# Patient Record
Sex: Female | Born: 1950 | Race: White | Hispanic: No | Marital: Married | Smoking: Current every day smoker
Health system: Southern US, Community
[De-identification: ages and names within clinical notes are randomized; demographics above are authoritative.]

## PROBLEM LIST (undated history)

## (undated) DIAGNOSIS — I2510 Atherosclerotic heart disease of native coronary artery without angina pectoris: Secondary | ICD-10-CM

## (undated) DIAGNOSIS — N184 Chronic kidney disease, stage 4 (severe): Secondary | ICD-10-CM

## (undated) DIAGNOSIS — R2689 Other abnormalities of gait and mobility: Secondary | ICD-10-CM

## (undated) DIAGNOSIS — I110 Hypertensive heart disease with heart failure: Secondary | ICD-10-CM

## (undated) DIAGNOSIS — E46 Unspecified protein-calorie malnutrition: Secondary | ICD-10-CM

## (undated) DIAGNOSIS — D509 Iron deficiency anemia, unspecified: Secondary | ICD-10-CM

## (undated) DIAGNOSIS — I739 Peripheral vascular disease, unspecified: Secondary | ICD-10-CM

## (undated) DIAGNOSIS — F32A Depression, unspecified: Secondary | ICD-10-CM

## (undated) DIAGNOSIS — M6281 Muscle weakness (generalized): Secondary | ICD-10-CM

## (undated) DIAGNOSIS — E785 Hyperlipidemia, unspecified: Secondary | ICD-10-CM

## (undated) DIAGNOSIS — I5042 Chronic combined systolic (congestive) and diastolic (congestive) heart failure: Secondary | ICD-10-CM

## (undated) DIAGNOSIS — I272 Pulmonary hypertension, unspecified: Secondary | ICD-10-CM

## (undated) DIAGNOSIS — I779 Disorder of arteries and arterioles, unspecified: Secondary | ICD-10-CM

## (undated) DIAGNOSIS — F329 Major depressive disorder, single episode, unspecified: Secondary | ICD-10-CM

## (undated) DIAGNOSIS — L02214 Cutaneous abscess of groin: Secondary | ICD-10-CM

## (undated) DIAGNOSIS — F0390 Unspecified dementia without behavioral disturbance: Secondary | ICD-10-CM

## (undated) DIAGNOSIS — I10 Essential (primary) hypertension: Secondary | ICD-10-CM

## (undated) DIAGNOSIS — R001 Bradycardia, unspecified: Secondary | ICD-10-CM

## (undated) DIAGNOSIS — E11319 Type 2 diabetes mellitus with unspecified diabetic retinopathy without macular edema: Secondary | ICD-10-CM

## (undated) DIAGNOSIS — K922 Gastrointestinal hemorrhage, unspecified: Secondary | ICD-10-CM

## (undated) DIAGNOSIS — J449 Chronic obstructive pulmonary disease, unspecified: Secondary | ICD-10-CM

## (undated) DIAGNOSIS — I701 Atherosclerosis of renal artery: Secondary | ICD-10-CM

## (undated) DIAGNOSIS — Z720 Tobacco use: Secondary | ICD-10-CM

## (undated) DIAGNOSIS — F419 Anxiety disorder, unspecified: Secondary | ICD-10-CM

## (undated) DIAGNOSIS — K5520 Angiodysplasia of colon without hemorrhage: Secondary | ICD-10-CM

## (undated) DIAGNOSIS — R319 Hematuria, unspecified: Secondary | ICD-10-CM

## (undated) DIAGNOSIS — D638 Anemia in other chronic diseases classified elsewhere: Secondary | ICD-10-CM

## (undated) HISTORY — DX: Depression, unspecified: F32.A

## (undated) HISTORY — DX: Chronic obstructive pulmonary disease, unspecified: J44.9

## (undated) HISTORY — DX: Disorder of arteries and arterioles, unspecified: I77.9

## (undated) HISTORY — PX: APPENDECTOMY: SHX54

## (undated) HISTORY — DX: Atherosclerotic heart disease of native coronary artery without angina pectoris: I25.10

## (undated) HISTORY — DX: Hyperlipidemia, unspecified: E78.5

## (undated) HISTORY — DX: Peripheral vascular disease, unspecified: I73.9

## (undated) HISTORY — DX: Major depressive disorder, single episode, unspecified: F32.9

## (undated) HISTORY — DX: Pulmonary hypertension, unspecified: I27.20

## (undated) HISTORY — DX: Chronic kidney disease, stage 4 (severe): N18.4

## (undated) HISTORY — PX: CHOLECYSTECTOMY: SHX55

## (undated) HISTORY — DX: Essential (primary) hypertension: I10

## (undated) HISTORY — DX: Atherosclerosis of renal artery: I70.1

## (undated) HISTORY — PX: TONSILLECTOMY: SUR1361

## (undated) HISTORY — DX: Chronic combined systolic (congestive) and diastolic (congestive) heart failure: I50.42

## (undated) HISTORY — DX: Hypertensive heart disease with heart failure: I11.0

## (undated) HISTORY — PX: SHOULDER ARTHROSCOPY: SHX128

## (undated) HISTORY — DX: Tobacco use: Z72.0

## (undated) HISTORY — PX: CAROTID ENDARTERECTOMY: SUR193

## (undated) HISTORY — PX: ILIAC ARTERY STENT: SHX1786

## (undated) HISTORY — DX: Bradycardia, unspecified: R00.1

---

## 1998-01-08 ENCOUNTER — Ambulatory Visit (HOSPITAL_COMMUNITY): Admission: RE | Admit: 1998-01-08 | Discharge: 1998-01-08 | Payer: Self-pay | Admitting: Cardiology

## 1998-07-05 ENCOUNTER — Emergency Department (HOSPITAL_COMMUNITY): Admission: EM | Admit: 1998-07-05 | Discharge: 1998-07-05 | Payer: Self-pay | Admitting: Emergency Medicine

## 1998-07-10 ENCOUNTER — Emergency Department (HOSPITAL_COMMUNITY): Admission: EM | Admit: 1998-07-10 | Discharge: 1998-07-10 | Payer: Self-pay | Admitting: Emergency Medicine

## 1998-12-10 ENCOUNTER — Inpatient Hospital Stay (HOSPITAL_COMMUNITY): Admission: EM | Admit: 1998-12-10 | Discharge: 1998-12-14 | Payer: Self-pay | Admitting: *Deleted

## 1999-02-03 ENCOUNTER — Inpatient Hospital Stay (HOSPITAL_COMMUNITY): Admission: EM | Admit: 1999-02-03 | Discharge: 1999-02-04 | Payer: Self-pay | Admitting: Emergency Medicine

## 1999-02-03 ENCOUNTER — Encounter: Payer: Self-pay | Admitting: Emergency Medicine

## 2000-04-18 ENCOUNTER — Emergency Department (HOSPITAL_COMMUNITY): Admission: EM | Admit: 2000-04-18 | Discharge: 2000-04-18 | Payer: Self-pay | Admitting: Emergency Medicine

## 2000-04-22 ENCOUNTER — Inpatient Hospital Stay (HOSPITAL_COMMUNITY): Admission: EM | Admit: 2000-04-22 | Discharge: 2000-04-24 | Payer: Self-pay | Admitting: Emergency Medicine

## 2000-04-22 ENCOUNTER — Encounter: Payer: Self-pay | Admitting: Emergency Medicine

## 2001-04-27 ENCOUNTER — Other Ambulatory Visit: Admission: RE | Admit: 2001-04-27 | Discharge: 2001-04-27 | Payer: Self-pay | Admitting: Family Medicine

## 2001-05-16 ENCOUNTER — Encounter: Admission: RE | Admit: 2001-05-16 | Discharge: 2001-05-16 | Payer: Self-pay | Admitting: Family Medicine

## 2001-05-16 ENCOUNTER — Encounter: Payer: Self-pay | Admitting: Family Medicine

## 2001-08-21 ENCOUNTER — Encounter: Payer: Self-pay | Admitting: Internal Medicine

## 2001-08-21 ENCOUNTER — Ambulatory Visit (HOSPITAL_COMMUNITY): Admission: RE | Admit: 2001-08-21 | Discharge: 2001-08-21 | Payer: Self-pay | Admitting: Internal Medicine

## 2002-02-05 ENCOUNTER — Encounter: Payer: Self-pay | Admitting: Emergency Medicine

## 2002-02-05 ENCOUNTER — Inpatient Hospital Stay (HOSPITAL_COMMUNITY): Admission: EM | Admit: 2002-02-05 | Discharge: 2002-02-08 | Payer: Self-pay | Admitting: Emergency Medicine

## 2002-02-06 ENCOUNTER — Encounter: Payer: Self-pay | Admitting: Family Medicine

## 2002-02-06 ENCOUNTER — Encounter: Payer: Self-pay | Admitting: Cardiology

## 2002-03-15 ENCOUNTER — Encounter: Admission: RE | Admit: 2002-03-15 | Discharge: 2002-03-15 | Payer: Self-pay | Admitting: Family Medicine

## 2002-04-15 ENCOUNTER — Encounter: Payer: Self-pay | Admitting: Emergency Medicine

## 2002-04-15 ENCOUNTER — Inpatient Hospital Stay (HOSPITAL_COMMUNITY): Admission: EM | Admit: 2002-04-15 | Discharge: 2002-04-19 | Payer: Self-pay | Admitting: Emergency Medicine

## 2002-05-25 ENCOUNTER — Emergency Department (HOSPITAL_COMMUNITY): Admission: EM | Admit: 2002-05-25 | Discharge: 2002-05-25 | Payer: Self-pay | Admitting: Emergency Medicine

## 2003-04-06 ENCOUNTER — Emergency Department (HOSPITAL_COMMUNITY): Admission: AD | Admit: 2003-04-06 | Discharge: 2003-04-06 | Payer: Self-pay | Admitting: Family Medicine

## 2003-05-08 ENCOUNTER — Encounter: Admission: RE | Admit: 2003-05-08 | Discharge: 2003-05-08 | Payer: Self-pay | Admitting: Nephrology

## 2003-05-20 ENCOUNTER — Ambulatory Visit (HOSPITAL_COMMUNITY): Admission: RE | Admit: 2003-05-20 | Discharge: 2003-05-20 | Payer: Self-pay | Admitting: Nephrology

## 2003-06-09 ENCOUNTER — Ambulatory Visit (HOSPITAL_COMMUNITY): Admission: RE | Admit: 2003-06-09 | Discharge: 2003-06-09 | Payer: Self-pay | Admitting: Nephrology

## 2003-06-10 ENCOUNTER — Ambulatory Visit (HOSPITAL_COMMUNITY): Admission: RE | Admit: 2003-06-10 | Discharge: 2003-06-10 | Payer: Self-pay | Admitting: Interventional Radiology

## 2003-10-06 ENCOUNTER — Ambulatory Visit (HOSPITAL_COMMUNITY): Admission: RE | Admit: 2003-10-06 | Discharge: 2003-10-06 | Payer: Self-pay | Admitting: Internal Medicine

## 2003-10-16 ENCOUNTER — Ambulatory Visit (HOSPITAL_COMMUNITY): Admission: RE | Admit: 2003-10-16 | Discharge: 2003-10-16 | Payer: Self-pay | Admitting: Internal Medicine

## 2003-10-20 ENCOUNTER — Inpatient Hospital Stay (HOSPITAL_COMMUNITY): Admission: EM | Admit: 2003-10-20 | Discharge: 2003-10-23 | Payer: Self-pay | Admitting: Emergency Medicine

## 2003-10-28 ENCOUNTER — Emergency Department (HOSPITAL_COMMUNITY): Admission: EM | Admit: 2003-10-28 | Discharge: 2003-10-29 | Payer: Self-pay | Admitting: Emergency Medicine

## 2003-11-05 ENCOUNTER — Encounter: Admission: RE | Admit: 2003-11-05 | Discharge: 2003-11-05 | Payer: Self-pay | Admitting: Family Medicine

## 2003-11-10 ENCOUNTER — Other Ambulatory Visit: Admission: RE | Admit: 2003-11-10 | Discharge: 2003-11-10 | Payer: Self-pay | Admitting: Obstetrics and Gynecology

## 2004-08-09 ENCOUNTER — Emergency Department (HOSPITAL_COMMUNITY): Admission: EM | Admit: 2004-08-09 | Discharge: 2004-08-10 | Payer: Self-pay | Admitting: Emergency Medicine

## 2004-08-10 ENCOUNTER — Ambulatory Visit (HOSPITAL_COMMUNITY): Admission: RE | Admit: 2004-08-10 | Discharge: 2004-08-10 | Payer: Self-pay | Admitting: Emergency Medicine

## 2004-08-19 ENCOUNTER — Encounter (HOSPITAL_COMMUNITY): Admission: RE | Admit: 2004-08-19 | Discharge: 2004-08-31 | Payer: Self-pay | Admitting: Internal Medicine

## 2004-08-23 ENCOUNTER — Ambulatory Visit (HOSPITAL_COMMUNITY): Admission: RE | Admit: 2004-08-23 | Discharge: 2004-08-23 | Payer: Self-pay | Admitting: Vascular Surgery

## 2004-09-07 ENCOUNTER — Ambulatory Visit: Payer: Self-pay | Admitting: Internal Medicine

## 2004-09-07 ENCOUNTER — Inpatient Hospital Stay (HOSPITAL_COMMUNITY): Admission: RE | Admit: 2004-09-07 | Discharge: 2004-09-11 | Payer: Self-pay | Admitting: *Deleted

## 2004-09-07 ENCOUNTER — Encounter (INDEPENDENT_AMBULATORY_CARE_PROVIDER_SITE_OTHER): Payer: Self-pay | Admitting: Specialist

## 2004-09-14 ENCOUNTER — Ambulatory Visit: Payer: Self-pay | Admitting: Internal Medicine

## 2004-09-16 ENCOUNTER — Encounter (HOSPITAL_COMMUNITY): Admission: RE | Admit: 2004-09-16 | Discharge: 2004-12-15 | Payer: Self-pay | Admitting: Internal Medicine

## 2004-09-24 ENCOUNTER — Ambulatory Visit: Payer: Self-pay | Admitting: Internal Medicine

## 2004-09-29 ENCOUNTER — Ambulatory Visit: Payer: Self-pay | Admitting: Internal Medicine

## 2004-09-29 ENCOUNTER — Encounter (INDEPENDENT_AMBULATORY_CARE_PROVIDER_SITE_OTHER): Payer: Self-pay | Admitting: *Deleted

## 2004-09-29 ENCOUNTER — Ambulatory Visit (HOSPITAL_COMMUNITY): Admission: RE | Admit: 2004-09-29 | Discharge: 2004-09-29 | Payer: Self-pay | Admitting: Internal Medicine

## 2005-07-28 ENCOUNTER — Ambulatory Visit: Payer: Self-pay | Admitting: Cardiology

## 2005-07-28 ENCOUNTER — Inpatient Hospital Stay (HOSPITAL_COMMUNITY): Admission: EM | Admit: 2005-07-28 | Discharge: 2005-08-03 | Payer: Self-pay | Admitting: Emergency Medicine

## 2005-07-29 ENCOUNTER — Encounter: Payer: Self-pay | Admitting: Cardiology

## 2005-08-04 ENCOUNTER — Ambulatory Visit: Payer: Self-pay | Admitting: Gastroenterology

## 2005-08-18 ENCOUNTER — Inpatient Hospital Stay (HOSPITAL_COMMUNITY): Admission: EM | Admit: 2005-08-18 | Discharge: 2005-08-23 | Payer: Self-pay | Admitting: Emergency Medicine

## 2005-08-24 ENCOUNTER — Inpatient Hospital Stay (HOSPITAL_COMMUNITY): Admission: EM | Admit: 2005-08-24 | Discharge: 2005-08-30 | Payer: Self-pay | Admitting: Emergency Medicine

## 2005-08-25 ENCOUNTER — Encounter: Payer: Self-pay | Admitting: Cardiology

## 2005-09-06 ENCOUNTER — Ambulatory Visit: Payer: Self-pay | Admitting: Internal Medicine

## 2005-10-10 ENCOUNTER — Ambulatory Visit: Payer: Self-pay | Admitting: Cardiology

## 2005-10-13 ENCOUNTER — Ambulatory Visit: Payer: Self-pay | Admitting: Hematology and Oncology

## 2005-10-19 LAB — CBC & DIFF AND RETIC
BASO%: 1.1 % (ref 0.0–2.0)
EOS%: 1.1 % (ref 0.0–7.0)
IRF: 0.48 — ABNORMAL HIGH (ref 0.130–0.330)
MCH: 26.2 pg (ref 26.0–34.0)
MCV: 80.9 fL — ABNORMAL LOW (ref 81.0–101.0)
MONO%: 6.9 % (ref 0.0–13.0)
RBC: 3.3 10*6/uL — ABNORMAL LOW (ref 3.70–5.32)
RDW: 17.8 % — ABNORMAL HIGH (ref 11.3–14.5)
RETIC #: 87.8 10*3/uL (ref 19.7–115.1)
Retic %: 2.7 % — ABNORMAL HIGH (ref 0.4–2.3)
lymph#: 1.1 10*3/uL (ref 0.9–3.3)

## 2005-10-19 LAB — CHCC SMEAR

## 2005-10-20 ENCOUNTER — Other Ambulatory Visit: Admission: RE | Admit: 2005-10-20 | Discharge: 2005-10-20 | Payer: Self-pay | Admitting: Hematology and Oncology

## 2005-10-20 ENCOUNTER — Encounter (HOSPITAL_COMMUNITY): Admission: RE | Admit: 2005-10-20 | Discharge: 2005-11-16 | Payer: Self-pay | Admitting: Hematology and Oncology

## 2005-10-20 ENCOUNTER — Encounter (INDEPENDENT_AMBULATORY_CARE_PROVIDER_SITE_OTHER): Payer: Self-pay | Admitting: *Deleted

## 2005-10-20 LAB — COMPREHENSIVE METABOLIC PANEL
ALT: <8 U/L (ref 0–40)
CO2: 28 mEq/L (ref 19–32)
Sodium: 136 mEq/L (ref 135–145)
Total Bilirubin: 0.3 mg/dL (ref 0.3–1.2)
Total Protein: 7.1 g/dL (ref 6.0–8.3)

## 2005-10-20 LAB — SPEP & IFE WITH QIG
Albumin ELP: 57.2 % (ref 55.8–66.1)
Beta 2: 4.6 % (ref 3.2–6.5)
Gamma Globulin: 9.1 % — ABNORMAL LOW (ref 11.1–18.8)
IgA: 155 mg/dL (ref 68–378)
IgG (Immunoglobin G), Serum: 545 mg/dL — ABNORMAL LOW (ref 694–1618)
IgM, Serum: 96 mg/dL (ref 60–263)

## 2005-10-20 LAB — IRON AND TIBC
%SAT: 6 % — ABNORMAL LOW (ref 20–55)
TIBC: 634 ug/dL — ABNORMAL HIGH (ref 250–470)

## 2005-10-20 LAB — FERRITIN: Ferritin: 10 ng/mL (ref 10–291)

## 2005-10-21 ENCOUNTER — Ambulatory Visit: Payer: Self-pay

## 2005-10-21 LAB — TYPE & CROSSMATCH - CHCC

## 2005-11-09 LAB — CBC WITH DIFFERENTIAL/PLATELET
Basophils Absolute: 0.1 10*3/uL (ref 0.0–0.1)
Eosinophils Absolute: 0 10*3/uL (ref 0.0–0.5)
HCT: 26.5 % — ABNORMAL LOW (ref 34.8–46.6)
HGB: 8.7 g/dL — ABNORMAL LOW (ref 11.6–15.9)
LYMPH%: 19.5 % (ref 14.0–48.0)
MCV: 79.8 fL — ABNORMAL LOW (ref 81.0–101.0)
MONO#: 0.5 10*3/uL (ref 0.1–0.9)
MONO%: 7 % (ref 0.0–13.0)
NEUT#: 4.7 10*3/uL (ref 1.5–6.5)
Platelets: 368 10*3/uL (ref 145–400)

## 2005-11-09 LAB — BASIC METABOLIC PANEL
BUN: 41 mg/dL — ABNORMAL HIGH (ref 6–23)
CO2: 28 mEq/L (ref 19–32)
Calcium: 10.5 mg/dL (ref 8.4–10.5)
Glucose, Bld: 186 mg/dL — ABNORMAL HIGH (ref 70–99)
Potassium: 4.1 mEq/L (ref 3.5–5.3)

## 2005-11-10 LAB — TYPE & CROSSMATCH - CHCC

## 2005-11-24 ENCOUNTER — Ambulatory Visit: Payer: Self-pay | Admitting: Cardiology

## 2005-11-28 ENCOUNTER — Ambulatory Visit: Payer: Self-pay | Admitting: Hematology and Oncology

## 2005-12-05 ENCOUNTER — Ambulatory Visit: Payer: Self-pay | Admitting: Cardiology

## 2005-12-06 ENCOUNTER — Ambulatory Visit: Payer: Self-pay | Admitting: Internal Medicine

## 2005-12-09 ENCOUNTER — Ambulatory Visit (HOSPITAL_COMMUNITY): Admission: AD | Admit: 2005-12-09 | Discharge: 2005-12-09 | Payer: Self-pay | Admitting: Cardiology

## 2005-12-09 ENCOUNTER — Ambulatory Visit: Payer: Self-pay | Admitting: Cardiology

## 2005-12-11 ENCOUNTER — Inpatient Hospital Stay (HOSPITAL_COMMUNITY): Admission: EM | Admit: 2005-12-11 | Discharge: 2005-12-12 | Payer: Self-pay | Admitting: Emergency Medicine

## 2005-12-26 ENCOUNTER — Ambulatory Visit: Payer: Self-pay | Admitting: Internal Medicine

## 2005-12-26 LAB — CONVERTED CEMR LAB
OCCULT 1: POSITIVE — AB
OCCULT 2: POSITIVE — AB

## 2005-12-28 ENCOUNTER — Ambulatory Visit: Payer: Self-pay

## 2005-12-28 ENCOUNTER — Ambulatory Visit: Payer: Self-pay | Admitting: Cardiology

## 2005-12-30 ENCOUNTER — Ambulatory Visit: Payer: Self-pay | Admitting: Cardiology

## 2005-12-30 ENCOUNTER — Ambulatory Visit (HOSPITAL_COMMUNITY): Admission: AD | Admit: 2005-12-30 | Discharge: 2005-12-31 | Payer: Self-pay | Admitting: Cardiology

## 2006-01-10 ENCOUNTER — Ambulatory Visit: Payer: Self-pay

## 2006-01-10 ENCOUNTER — Ambulatory Visit: Payer: Self-pay | Admitting: Cardiology

## 2006-01-24 ENCOUNTER — Ambulatory Visit: Payer: Self-pay | Admitting: Hematology and Oncology

## 2006-01-25 LAB — IRON AND TIBC
%SAT: 3 % — ABNORMAL LOW (ref 20–55)
TIBC: 547 ug/dL — ABNORMAL HIGH (ref 250–470)
UIBC: 529 ug/dL

## 2006-01-25 LAB — CBC WITH DIFFERENTIAL/PLATELET
Basophils Absolute: 0.1 10*3/uL (ref 0.0–0.1)
Eosinophils Absolute: 0.1 10*3/uL (ref 0.0–0.5)
HGB: 6.6 g/dL — CL (ref 11.6–15.9)
LYMPH%: 18.5 % (ref 14.0–48.0)
MCV: 84.6 fL (ref 81.0–101.0)
MONO#: 0.4 10*3/uL (ref 0.1–0.9)
MONO%: 8.8 % (ref 0.0–13.0)
NEUT#: 3.6 10*3/uL (ref 1.5–6.5)
Platelets: 363 10*3/uL (ref 145–400)
RBC: 2.44 10*6/uL — ABNORMAL LOW (ref 3.70–5.32)
WBC: 5.1 10*3/uL (ref 3.9–10.0)

## 2006-01-25 LAB — VITAMIN B12: Vitamin B-12: 695 pg/mL (ref 211–911)

## 2006-01-25 LAB — FERRITIN: Ferritin: 15 ng/mL (ref 10–291)

## 2006-01-26 ENCOUNTER — Encounter (HOSPITAL_COMMUNITY): Admission: RE | Admit: 2006-01-26 | Discharge: 2006-01-26 | Payer: Self-pay | Admitting: *Deleted

## 2006-03-28 ENCOUNTER — Ambulatory Visit: Payer: Self-pay | Admitting: Hematology and Oncology

## 2006-03-30 LAB — CBC WITH DIFFERENTIAL/PLATELET
Basophils Absolute: 0.1 10*3/uL (ref 0.0–0.1)
EOS%: 1.6 % (ref 0.0–7.0)
Eosinophils Absolute: 0.1 10*3/uL (ref 0.0–0.5)
HGB: 12 g/dL (ref 11.6–15.9)
LYMPH%: 17.1 % (ref 14.0–48.0)
MCH: 32.1 pg (ref 26.0–34.0)
MCV: 92.3 fL (ref 81.0–101.0)
MONO%: 8.4 % (ref 0.0–13.0)
NEUT#: 4.2 10*3/uL (ref 1.5–6.5)
Platelets: 258 10*3/uL (ref 145–400)
RDW: 16.4 % — ABNORMAL HIGH (ref 11.3–14.5)

## 2006-03-30 LAB — COMPREHENSIVE METABOLIC PANEL
AST: 20 U/L (ref 0–37)
Albumin: 4.3 g/dL (ref 3.5–5.2)
Alkaline Phosphatase: 83 U/L (ref 39–117)
BUN: 42 mg/dL — ABNORMAL HIGH (ref 6–23)
Creatinine, Ser: 1.08 mg/dL (ref 0.40–1.20)
Glucose, Bld: 180 mg/dL — ABNORMAL HIGH (ref 70–99)
Potassium: 4.6 mEq/L (ref 3.5–5.3)
Total Bilirubin: 0.2 mg/dL — ABNORMAL LOW (ref 0.3–1.2)

## 2006-03-30 LAB — IRON AND TIBC
Iron: 80 ug/dL (ref 42–145)
UIBC: 383 ug/dL

## 2006-03-31 ENCOUNTER — Ambulatory Visit: Payer: Self-pay | Admitting: Cardiology

## 2006-06-22 ENCOUNTER — Ambulatory Visit: Payer: Self-pay

## 2006-06-26 ENCOUNTER — Ambulatory Visit: Payer: Self-pay | Admitting: Cardiology

## 2006-07-03 ENCOUNTER — Ambulatory Visit: Payer: Self-pay | Admitting: Hematology and Oncology

## 2006-07-05 LAB — IRON AND TIBC: Iron: 154 ug/dL — ABNORMAL HIGH (ref 42–145)

## 2006-07-05 LAB — CBC WITH DIFFERENTIAL/PLATELET
BASO%: 1.1 % (ref 0.0–2.0)
EOS%: 1.9 % (ref 0.0–7.0)
LYMPH%: 20.3 % (ref 14.0–48.0)
MCHC: 34.9 g/dL (ref 32.0–36.0)
MCV: 91.4 fL (ref 81.0–101.0)
MONO%: 7.8 % (ref 0.0–13.0)
Platelets: 280 10*3/uL (ref 145–400)
RBC: 3.63 10*6/uL — ABNORMAL LOW (ref 3.70–5.32)
WBC: 6 10*3/uL (ref 3.9–10.0)

## 2006-08-16 ENCOUNTER — Encounter: Admission: RE | Admit: 2006-08-16 | Discharge: 2006-08-16 | Payer: Self-pay | Admitting: *Deleted

## 2006-09-22 ENCOUNTER — Ambulatory Visit: Payer: Self-pay | Admitting: Internal Medicine

## 2006-09-27 ENCOUNTER — Emergency Department: Payer: Self-pay | Admitting: Emergency Medicine

## 2006-10-03 ENCOUNTER — Ambulatory Visit: Payer: Self-pay | Admitting: Hematology and Oncology

## 2006-10-04 ENCOUNTER — Ambulatory Visit: Payer: Self-pay | Admitting: Internal Medicine

## 2006-10-04 ENCOUNTER — Encounter: Payer: Self-pay | Admitting: Internal Medicine

## 2006-10-10 LAB — CBC WITH DIFFERENTIAL/PLATELET
Basophils Absolute: 0.1 10*3/uL (ref 0.0–0.1)
EOS%: 2.1 % (ref 0.0–7.0)
HCT: 36.1 % (ref 34.8–46.6)
HGB: 12.5 g/dL (ref 11.6–15.9)
MCH: 31.5 pg (ref 26.0–34.0)
MCV: 90.8 fL (ref 81.0–101.0)
MONO%: 8.9 % (ref 0.0–13.0)
NEUT%: 70.7 % (ref 39.6–76.8)

## 2006-10-11 LAB — IRON AND TIBC
%SAT: 17 % — ABNORMAL LOW (ref 20–55)
TIBC: 482 ug/dL — ABNORMAL HIGH (ref 250–470)

## 2006-10-11 LAB — FERRITIN: Ferritin: 23 ng/mL (ref 10–291)

## 2006-11-17 ENCOUNTER — Ambulatory Visit: Payer: Self-pay | Admitting: Hematology and Oncology

## 2006-12-05 LAB — CBC WITH DIFFERENTIAL/PLATELET
BASO%: 0.9 % (ref 0.0–2.0)
Basophils Absolute: 0.1 10*3/uL (ref 0.0–0.1)
EOS%: 1.5 % (ref 0.0–7.0)
HCT: 33.4 % — ABNORMAL LOW (ref 34.8–46.6)
HGB: 11.7 g/dL (ref 11.6–15.9)
MCH: 32 pg (ref 26.0–34.0)
MCHC: 34.9 g/dL (ref 32.0–36.0)
MONO#: 0.5 10*3/uL (ref 0.1–0.9)
RDW: 15.2 % — ABNORMAL HIGH (ref 11.3–14.5)
WBC: 6.1 10*3/uL (ref 3.9–10.0)
lymph#: 1 10*3/uL (ref 0.9–3.3)

## 2006-12-05 LAB — IRON AND TIBC
%SAT: 13 % — ABNORMAL LOW (ref 20–55)
TIBC: 399 ug/dL (ref 250–470)
UIBC: 347 ug/dL

## 2006-12-19 ENCOUNTER — Encounter: Admission: RE | Admit: 2006-12-19 | Discharge: 2006-12-19 | Payer: Self-pay | Admitting: Orthopedic Surgery

## 2006-12-26 ENCOUNTER — Ambulatory Visit: Payer: Self-pay

## 2007-01-05 ENCOUNTER — Encounter: Admission: RE | Admit: 2007-01-05 | Discharge: 2007-01-05 | Payer: Self-pay | Admitting: Orthopedic Surgery

## 2007-01-09 ENCOUNTER — Encounter: Payer: Self-pay | Admitting: Orthopedic Surgery

## 2007-01-09 ENCOUNTER — Observation Stay (HOSPITAL_COMMUNITY): Admission: RE | Admit: 2007-01-09 | Discharge: 2007-01-10 | Payer: Self-pay | Admitting: Orthopedic Surgery

## 2007-01-30 ENCOUNTER — Ambulatory Visit: Payer: Self-pay | Admitting: Hematology and Oncology

## 2007-02-01 LAB — CBC WITH DIFFERENTIAL/PLATELET
BASO%: 0.6 % (ref 0.0–2.0)
EOS%: 1.8 % (ref 0.0–7.0)
HCT: 36.6 % (ref 34.8–46.6)
LYMPH%: 14.2 % (ref 14.0–48.0)
MCH: 30.6 pg (ref 26.0–34.0)
MCHC: 33.5 g/dL (ref 32.0–36.0)
MONO%: 8.2 % (ref 0.0–13.0)
NEUT%: 75.2 % (ref 39.6–76.8)
Platelets: 238 10*3/uL (ref 145–400)
RBC: 4.01 10*6/uL (ref 3.70–5.32)

## 2007-02-01 LAB — IRON AND TIBC: %SAT: 20 % (ref 20–55)

## 2007-03-09 ENCOUNTER — Ambulatory Visit: Payer: Self-pay | Admitting: Hematology and Oncology

## 2007-03-09 LAB — CBC WITH DIFFERENTIAL/PLATELET
Basophils Absolute: 0.1 10*3/uL (ref 0.0–0.1)
Eosinophils Absolute: 0.1 10*3/uL (ref 0.0–0.5)
HCT: 39.8 % (ref 34.8–46.6)
HGB: 13.1 g/dL (ref 11.6–15.9)
MCV: 90.6 fL (ref 81.0–101.0)
MONO%: 7.7 % (ref 0.0–13.0)
NEUT#: 5.3 10*3/uL (ref 1.5–6.5)
NEUT%: 71.9 % (ref 39.6–76.8)
Platelets: 250 10*3/uL (ref 145–400)
RDW: 15.2 % — ABNORMAL HIGH (ref 11.3–14.5)

## 2007-03-09 LAB — BASIC METABOLIC PANEL
BUN: 27 mg/dL — ABNORMAL HIGH (ref 6–23)
Glucose, Bld: 120 mg/dL — ABNORMAL HIGH (ref 70–99)
Potassium: 5.4 mEq/L — ABNORMAL HIGH (ref 3.5–5.3)

## 2007-03-09 LAB — IRON AND TIBC: Iron: 155 ug/dL — ABNORMAL HIGH (ref 42–145)

## 2007-04-26 ENCOUNTER — Ambulatory Visit: Payer: Self-pay | Admitting: Cardiology

## 2007-05-04 ENCOUNTER — Ambulatory Visit: Payer: Self-pay

## 2007-07-21 ENCOUNTER — Emergency Department: Payer: Self-pay | Admitting: Unknown Physician Specialty

## 2007-07-22 ENCOUNTER — Emergency Department (HOSPITAL_COMMUNITY): Admission: EM | Admit: 2007-07-22 | Discharge: 2007-07-22 | Payer: Self-pay | Admitting: Emergency Medicine

## 2007-11-22 ENCOUNTER — Ambulatory Visit: Payer: Self-pay | Admitting: Cardiology

## 2007-12-10 ENCOUNTER — Encounter: Admission: RE | Admit: 2007-12-10 | Discharge: 2007-12-10 | Payer: Self-pay | Admitting: *Deleted

## 2008-01-05 ENCOUNTER — Emergency Department: Payer: Self-pay | Admitting: Internal Medicine

## 2008-05-16 DIAGNOSIS — Z8719 Personal history of other diseases of the digestive system: Secondary | ICD-10-CM

## 2008-05-16 DIAGNOSIS — I080 Rheumatic disorders of both mitral and aortic valves: Secondary | ICD-10-CM

## 2008-05-16 DIAGNOSIS — F329 Major depressive disorder, single episode, unspecified: Secondary | ICD-10-CM

## 2008-05-16 DIAGNOSIS — Z8673 Personal history of transient ischemic attack (TIA), and cerebral infarction without residual deficits: Secondary | ICD-10-CM | POA: Insufficient documentation

## 2008-05-16 DIAGNOSIS — E118 Type 2 diabetes mellitus with unspecified complications: Secondary | ICD-10-CM | POA: Insufficient documentation

## 2008-05-16 DIAGNOSIS — F341 Dysthymic disorder: Secondary | ICD-10-CM

## 2008-05-16 DIAGNOSIS — D638 Anemia in other chronic diseases classified elsewhere: Secondary | ICD-10-CM

## 2008-05-16 DIAGNOSIS — I5022 Chronic systolic (congestive) heart failure: Secondary | ICD-10-CM

## 2008-05-16 DIAGNOSIS — E669 Obesity, unspecified: Secondary | ICD-10-CM | POA: Insufficient documentation

## 2008-05-16 DIAGNOSIS — G459 Transient cerebral ischemic attack, unspecified: Secondary | ICD-10-CM | POA: Insufficient documentation

## 2008-05-16 DIAGNOSIS — E162 Hypoglycemia, unspecified: Secondary | ICD-10-CM

## 2008-05-16 DIAGNOSIS — J449 Chronic obstructive pulmonary disease, unspecified: Secondary | ICD-10-CM

## 2008-05-16 DIAGNOSIS — E785 Hyperlipidemia, unspecified: Secondary | ICD-10-CM | POA: Insufficient documentation

## 2008-05-16 HISTORY — DX: Anemia in other chronic diseases classified elsewhere: D63.8

## 2008-05-19 ENCOUNTER — Encounter: Payer: Self-pay | Admitting: Cardiology

## 2008-05-19 ENCOUNTER — Ambulatory Visit: Payer: Self-pay | Admitting: Cardiology

## 2008-05-27 ENCOUNTER — Encounter: Payer: Self-pay | Admitting: Cardiology

## 2008-05-27 ENCOUNTER — Ambulatory Visit: Payer: Self-pay

## 2008-06-11 ENCOUNTER — Ambulatory Visit: Payer: Self-pay | Admitting: Cardiology

## 2008-06-27 ENCOUNTER — Inpatient Hospital Stay (HOSPITAL_COMMUNITY): Admission: EM | Admit: 2008-06-27 | Discharge: 2008-06-29 | Payer: Self-pay | Admitting: Emergency Medicine

## 2008-06-28 ENCOUNTER — Ambulatory Visit: Payer: Self-pay | Admitting: Gastroenterology

## 2008-07-18 ENCOUNTER — Inpatient Hospital Stay (HOSPITAL_COMMUNITY): Admission: EM | Admit: 2008-07-18 | Discharge: 2008-07-19 | Payer: Self-pay | Admitting: Emergency Medicine

## 2008-08-06 ENCOUNTER — Ambulatory Visit: Payer: Self-pay | Admitting: Hematology and Oncology

## 2008-08-06 ENCOUNTER — Encounter: Payer: Self-pay | Admitting: Internal Medicine

## 2008-08-06 LAB — CBC WITH DIFFERENTIAL/PLATELET
BASO%: 0.8 % (ref 0.0–2.0)
LYMPH%: 16.4 % (ref 14.0–49.7)
MCHC: 33 g/dL (ref 31.5–36.0)
MONO#: 0.6 10*3/uL (ref 0.1–0.9)
RBC: 4.49 10*6/uL (ref 3.70–5.45)
WBC: 9.6 10*3/uL (ref 3.9–10.3)
lymph#: 1.6 10*3/uL (ref 0.9–3.3)

## 2008-08-06 LAB — BASIC METABOLIC PANEL
CO2: 23 mEq/L (ref 19–32)
Calcium: 10.1 mg/dL (ref 8.4–10.5)
Chloride: 100 mEq/L (ref 96–112)
Sodium: 138 mEq/L (ref 135–145)

## 2008-08-06 LAB — IRON AND TIBC
%SAT: 13 % — ABNORMAL LOW (ref 20–55)
TIBC: 627 ug/dL — ABNORMAL HIGH (ref 250–470)
UIBC: 546 ug/dL

## 2008-10-02 ENCOUNTER — Ambulatory Visit: Payer: Self-pay | Admitting: Hematology and Oncology

## 2008-10-06 LAB — CBC WITH DIFFERENTIAL/PLATELET
Basophils Absolute: 0 10*3/uL (ref 0.0–0.1)
Eosinophils Absolute: 0.1 10*3/uL (ref 0.0–0.5)
HCT: 39 % (ref 34.8–46.6)
HGB: 13.1 g/dL (ref 11.6–15.9)
LYMPH%: 17.8 % (ref 14.0–49.7)
MONO#: 0.6 10*3/uL (ref 0.1–0.9)
NEUT#: 5.9 10*3/uL (ref 1.5–6.5)
NEUT%: 72.6 % (ref 38.4–76.8)
Platelets: 239 10*3/uL (ref 145–400)
RBC: 4.65 10*6/uL (ref 3.70–5.45)
WBC: 8.1 10*3/uL (ref 3.9–10.3)

## 2008-10-06 LAB — BASIC METABOLIC PANEL
BUN: 28 mg/dL — ABNORMAL HIGH (ref 6–23)
CO2: 22 mEq/L (ref 19–32)
Calcium: 10.1 mg/dL (ref 8.4–10.5)
Glucose, Bld: 157 mg/dL — ABNORMAL HIGH (ref 70–99)
Sodium: 143 mEq/L (ref 135–145)

## 2008-10-06 LAB — FERRITIN: Ferritin: 184 ng/mL (ref 10–291)

## 2008-10-06 LAB — IRON AND TIBC
TIBC: 429 ug/dL (ref 250–470)
UIBC: 334 ug/dL

## 2008-10-08 ENCOUNTER — Encounter: Payer: Self-pay | Admitting: Internal Medicine

## 2008-12-05 ENCOUNTER — Ambulatory Visit: Payer: Self-pay | Admitting: Hematology and Oncology

## 2008-12-11 ENCOUNTER — Encounter: Payer: Self-pay | Admitting: Internal Medicine

## 2008-12-11 LAB — CBC WITH DIFFERENTIAL/PLATELET
Basophils Absolute: 0 10*3/uL (ref 0.0–0.1)
HCT: 36 % (ref 34.8–46.6)
HGB: 12.3 g/dL (ref 11.6–15.9)
LYMPH%: 3.9 % — ABNORMAL LOW (ref 14.0–49.7)
MCH: 30.5 pg (ref 25.1–34.0)
MONO#: 0.3 10*3/uL (ref 0.1–0.9)
NEUT%: 93.9 % — ABNORMAL HIGH (ref 38.4–76.8)
Platelets: 272 10*3/uL (ref 145–400)
WBC: 13.4 10*3/uL — ABNORMAL HIGH (ref 3.9–10.3)
lymph#: 0.5 10*3/uL — ABNORMAL LOW (ref 0.9–3.3)

## 2008-12-11 LAB — BASIC METABOLIC PANEL
BUN: 31 mg/dL — ABNORMAL HIGH (ref 6–23)
CO2: 24 mEq/L (ref 19–32)
Chloride: 103 mEq/L (ref 96–112)
Creatinine, Ser: 0.91 mg/dL (ref 0.40–1.20)

## 2008-12-13 ENCOUNTER — Emergency Department (HOSPITAL_COMMUNITY): Admission: EM | Admit: 2008-12-13 | Discharge: 2008-12-13 | Payer: Self-pay | Admitting: Emergency Medicine

## 2009-02-06 ENCOUNTER — Encounter: Admission: RE | Admit: 2009-02-06 | Discharge: 2009-02-06 | Payer: Self-pay | Admitting: Internal Medicine

## 2009-02-08 ENCOUNTER — Observation Stay (HOSPITAL_COMMUNITY): Admission: EM | Admit: 2009-02-08 | Discharge: 2009-02-08 | Payer: Self-pay | Admitting: Emergency Medicine

## 2009-02-18 ENCOUNTER — Ambulatory Visit: Payer: Self-pay | Admitting: Hematology and Oncology

## 2009-02-24 ENCOUNTER — Encounter: Payer: Self-pay | Admitting: Internal Medicine

## 2009-02-24 LAB — COMPREHENSIVE METABOLIC PANEL
AST: 18 U/L (ref 0–37)
CO2: 28 mEq/L (ref 19–32)
Calcium: 9.5 mg/dL (ref 8.4–10.5)
Chloride: 98 mEq/L (ref 96–112)
Creatinine, Ser: 0.93 mg/dL (ref 0.40–1.20)
Potassium: 4.2 mEq/L (ref 3.5–5.3)
Sodium: 140 mEq/L (ref 135–145)
Total Bilirubin: 0.2 mg/dL — ABNORMAL LOW (ref 0.3–1.2)
Total Protein: 6.3 g/dL (ref 6.0–8.3)

## 2009-02-24 LAB — CBC WITH DIFFERENTIAL/PLATELET
BASO%: 0.8 % (ref 0.0–2.0)
EOS%: 1.3 % (ref 0.0–7.0)
Eosinophils Absolute: 0.1 10*3/uL (ref 0.0–0.5)
LYMPH%: 13.5 % — ABNORMAL LOW (ref 14.0–49.7)
MCHC: 33.8 g/dL (ref 31.5–36.0)
MONO%: 7.3 % (ref 0.0–14.0)
NEUT%: 77.1 % — ABNORMAL HIGH (ref 38.4–76.8)
Platelets: 348 10*3/uL (ref 145–400)
RBC: 3.96 10*6/uL (ref 3.70–5.45)
RDW: 14.1 % (ref 11.2–14.5)
WBC: 11.1 10*3/uL — ABNORMAL HIGH (ref 3.9–10.3)
lymph#: 1.5 10*3/uL (ref 0.9–3.3)

## 2009-02-24 LAB — IRON AND TIBC
TIBC: 402 ug/dL (ref 250–470)
UIBC: 355 ug/dL

## 2009-04-20 ENCOUNTER — Ambulatory Visit: Payer: Self-pay | Admitting: Hematology and Oncology

## 2009-04-22 LAB — CBC WITH DIFFERENTIAL/PLATELET
BASO%: 0.8 % (ref 0.0–2.0)
Basophils Absolute: 0.1 10*3/uL (ref 0.0–0.1)
EOS%: 1.9 % (ref 0.0–7.0)
Eosinophils Absolute: 0.2 10*3/uL (ref 0.0–0.5)
HCT: 36 % (ref 34.8–46.6)
HGB: 12.3 g/dL (ref 11.6–15.9)
LYMPH%: 18.2 % (ref 14.0–49.7)
MCH: 30.9 pg (ref 25.1–34.0)
MCHC: 34 g/dL (ref 31.5–36.0)
MONO%: 6.2 % (ref 0.0–14.0)
NEUT#: 7.5 10*3/uL — ABNORMAL HIGH (ref 1.5–6.5)
Platelets: 433 10*3/uL — ABNORMAL HIGH (ref 145–400)
RBC: 3.96 10*6/uL (ref 3.70–5.45)
RDW: 15.3 % — ABNORMAL HIGH (ref 11.2–14.5)
lymph#: 1.9 10*3/uL (ref 0.9–3.3)

## 2009-04-22 LAB — COMPREHENSIVE METABOLIC PANEL
ALT: <8 U/L (ref 0–35)
BUN: 48 mg/dL — ABNORMAL HIGH (ref 6–23)
CO2: 26 mEq/L (ref 19–32)
Creatinine, Ser: 0.99 mg/dL (ref 0.40–1.20)
Glucose, Bld: 126 mg/dL — ABNORMAL HIGH (ref 70–99)
Sodium: 138 mEq/L (ref 135–145)

## 2009-04-24 ENCOUNTER — Encounter: Payer: Self-pay | Admitting: Internal Medicine

## 2009-06-07 ENCOUNTER — Emergency Department (HOSPITAL_COMMUNITY): Admission: EM | Admit: 2009-06-07 | Discharge: 2009-06-07 | Payer: Self-pay | Admitting: Emergency Medicine

## 2009-07-03 ENCOUNTER — Emergency Department: Payer: Self-pay | Admitting: Internal Medicine

## 2009-10-28 ENCOUNTER — Ambulatory Visit: Payer: Self-pay | Admitting: Hematology and Oncology

## 2009-10-30 ENCOUNTER — Encounter: Payer: Self-pay | Admitting: Internal Medicine

## 2009-10-30 LAB — CBC WITH DIFFERENTIAL/PLATELET
Eosinophils Absolute: 0.1 10*3/uL (ref 0.0–0.5)
HGB: 10.3 g/dL — ABNORMAL LOW (ref 11.6–15.9)
LYMPH%: 12.4 % — ABNORMAL LOW (ref 14.0–49.7)
NEUT#: 7.4 10*3/uL — ABNORMAL HIGH (ref 1.5–6.5)
lymph#: 1.2 10*3/uL (ref 0.9–3.3)

## 2009-10-30 LAB — FERRITIN: Ferritin: 33 ng/mL (ref 10–291)

## 2009-10-30 LAB — IRON AND TIBC: UIBC: 277 ug/dL

## 2009-10-30 LAB — COMPREHENSIVE METABOLIC PANEL
Albumin: 3.7 g/dL (ref 3.5–5.2)
BUN: 15 mg/dL (ref 6–23)
Calcium: 9.4 mg/dL (ref 8.4–10.5)
Chloride: 99 mEq/L (ref 96–112)
Glucose, Bld: 107 mg/dL — ABNORMAL HIGH (ref 70–99)
Potassium: 4.2 mEq/L (ref 3.5–5.3)
Sodium: 139 mEq/L (ref 135–145)
Total Bilirubin: 0.2 mg/dL — ABNORMAL LOW (ref 0.3–1.2)

## 2009-11-05 ENCOUNTER — Inpatient Hospital Stay (HOSPITAL_COMMUNITY): Admission: EM | Admit: 2009-11-05 | Discharge: 2009-11-07 | Payer: Self-pay | Admitting: Emergency Medicine

## 2009-11-12 ENCOUNTER — Encounter (INDEPENDENT_AMBULATORY_CARE_PROVIDER_SITE_OTHER): Payer: Self-pay | Admitting: Internal Medicine

## 2009-11-12 LAB — CONVERTED CEMR LAB
ALT: 9 units/L (ref 0–35)
Albumin: 3.8 g/dL (ref 3.5–5.2)
BUN: 25 mg/dL — ABNORMAL HIGH (ref 6–23)
CO2: 31 meq/L (ref 19–32)
Calcium: 10.1 mg/dL (ref 8.4–10.5)
Eosinophils Relative: 2 % (ref 0–5)
Ferritin: 707 ng/mL — ABNORMAL HIGH (ref 10–291)
HCT: 41.2 % (ref 36.0–46.0)
Hemoglobin: 12.7 g/dL (ref 12.0–15.0)
Lymphocytes Relative: 17 % (ref 12–46)
MCHC: 30.8 g/dL (ref 30.0–36.0)
MCV: 94.7 fL (ref 78.0–100.0)
Platelets: 411 10*3/uL — ABNORMAL HIGH (ref 150–400)
Sodium: 142 meq/L (ref 135–145)
Total Protein: 6.4 g/dL (ref 6.0–8.3)
Vit D, 25-Hydroxy: 61 ng/mL (ref 30–89)
WBC: 8.4 10*3/uL (ref 4.0–10.5)

## 2009-11-15 ENCOUNTER — Emergency Department (HOSPITAL_COMMUNITY): Admission: EM | Admit: 2009-11-15 | Discharge: 2009-11-15 | Payer: Self-pay | Admitting: Family Medicine

## 2009-11-17 ENCOUNTER — Emergency Department: Payer: Self-pay | Admitting: Emergency Medicine

## 2010-01-22 ENCOUNTER — Encounter (INDEPENDENT_AMBULATORY_CARE_PROVIDER_SITE_OTHER): Payer: Self-pay | Admitting: *Deleted

## 2010-01-26 ENCOUNTER — Ambulatory Visit (HOSPITAL_BASED_OUTPATIENT_CLINIC_OR_DEPARTMENT_OTHER): Payer: Self-pay | Admitting: Hematology and Oncology

## 2010-03-14 ENCOUNTER — Encounter: Payer: Self-pay | Admitting: Gastroenterology

## 2010-03-23 NOTE — Letter (Signed)
[facility]   Imported By: Sherian Rein 03/19/2009 14:24:49  _____________________________________________________________________  External Attachment:    Type:   Image     Comment:   External Document

## 2010-03-23 NOTE — Letter (Signed)
[facility]   Imported By: Lester Marland 05/19/2009 07:44:09  _____________________________________________________________________  External Attachment:    Type:   Image     Comment:   External Document

## 2010-03-23 NOTE — Letter (Signed)
Summary: [facility]  [facility]   Imported By: Sherian Rein 11/12/2009 07:48:46  _____________________________________________________________________  External Attachment:    Type:   Image     Comment:   External Document

## 2010-04-13 ENCOUNTER — Encounter (INDEPENDENT_AMBULATORY_CARE_PROVIDER_SITE_OTHER): Payer: Self-pay | Admitting: *Deleted

## 2010-04-13 LAB — CONVERTED CEMR LAB
Cholesterol: 225 mg/dL — ABNORMAL HIGH (ref 0–200)
HDL: 37 mg/dL — ABNORMAL LOW (ref >39)
Total CHOL/HDL Ratio: 6.1

## 2010-05-06 LAB — CBC
HCT: 32.4 % — ABNORMAL LOW (ref 36.0–46.0)
HCT: 32.6 % — ABNORMAL LOW (ref 36.0–46.0)
HCT: 33.7 % — ABNORMAL LOW (ref 36.0–46.0)
Hemoglobin: 10.5 g/dL — ABNORMAL LOW (ref 12.0–15.0)
Hemoglobin: 10.7 g/dL — ABNORMAL LOW (ref 12.0–15.0)
MCH: 29.1 pg (ref 26.0–34.0)
MCH: 29.4 pg (ref 26.0–34.0)
MCH: 29.7 pg (ref 26.0–34.0)
MCHC: 32.3 g/dL (ref 30.0–36.0)
MCHC: 32.9 g/dL (ref 30.0–36.0)
RBC: 3.59 MIL/uL — ABNORMAL LOW (ref 3.87–5.11)
RBC: 3.65 MIL/uL — ABNORMAL LOW (ref 3.87–5.11)
RBC: 3.75 MIL/uL — ABNORMAL LOW (ref 3.87–5.11)
RDW: 15.4 % (ref 11.5–15.5)

## 2010-05-06 LAB — BASIC METABOLIC PANEL
CO2: 30 mEq/L (ref 19–32)
CO2: 32 mEq/L (ref 19–32)
Chloride: 103 mEq/L (ref 96–112)
GFR calc Af Amer: >60 mL/min (ref >60)
GFR calc non Af Amer: >60 mL/min (ref >60)
Glucose, Bld: 157 mg/dL — ABNORMAL HIGH (ref 70–99)
Glucose, Bld: 212 mg/dL — ABNORMAL HIGH (ref 70–99)
Potassium: 3.8 mEq/L (ref 3.5–5.1)
Potassium: 4.4 mEq/L (ref 3.5–5.1)
Sodium: 141 mEq/L (ref 135–145)
Sodium: 144 mEq/L (ref 135–145)

## 2010-05-06 LAB — CARDIAC PANEL(CRET KIN+CKTOT+MB+TROPI)
CK, MB: 0.7 ng/mL (ref 0.3–4.0)
CK, MB: 0.8 ng/mL (ref 0.3–4.0)
Relative Index: INVALID (ref 0.0–2.5)
Total CK: 28 U/L (ref 7–177)
Troponin I: 0.02 ng/mL (ref 0.00–0.06)

## 2010-05-06 LAB — GLUCOSE, CAPILLARY
Glucose-Capillary: 182 mg/dL — ABNORMAL HIGH (ref 70–99)
Glucose-Capillary: 207 mg/dL — ABNORMAL HIGH (ref 70–99)
Glucose-Capillary: 91 mg/dL (ref 70–99)

## 2010-05-06 LAB — CULTURE, BLOOD (ROUTINE X 2): Culture  Setup Time: 201109151018

## 2010-05-06 LAB — POCT CARDIAC MARKERS: Myoglobin, poc: 63 ng/mL (ref 12–200)

## 2010-05-06 LAB — DIFFERENTIAL
Basophils Absolute: 0.1 10*3/uL (ref 0.0–0.1)
Basophils Relative: 1 % (ref 0–1)
Eosinophils Absolute: 0.2 10*3/uL (ref 0.0–0.7)
Lymphs Abs: 0.8 10*3/uL (ref 0.7–4.0)
Monocytes Absolute: 0.7 10*3/uL (ref 0.1–1.0)
Monocytes Absolute: 0.9 10*3/uL (ref 0.1–1.0)
Monocytes Relative: 6 % (ref 3–12)
Monocytes Relative: 8 % (ref 3–12)
Neutro Abs: 6.7 10*3/uL (ref 1.7–7.7)
Neutrophils Relative %: 80 % — ABNORMAL HIGH (ref 43–77)

## 2010-05-06 LAB — POCT I-STAT, CHEM 8
BUN: 16 mg/dL (ref 6–23)
Calcium, Ion: 1.18 mmol/L (ref 1.12–1.32)
Chloride: 104 mEq/L (ref 96–112)
Creatinine, Ser: 0.7 mg/dL (ref 0.4–1.2)
Glucose, Bld: 169 mg/dL — ABNORMAL HIGH (ref 70–99)
HCT: 36 % (ref 36.0–46.0)
Hemoglobin: 12.2 g/dL (ref 12.0–15.0)
Potassium: 3.3 mEq/L — ABNORMAL LOW (ref 3.5–5.1)
Sodium: 143 mEq/L (ref 135–145)
TCO2: 31 mmol/L (ref 0–100)

## 2010-05-06 LAB — PHOSPHORUS: Phosphorus: 3.6 mg/dL (ref 2.3–4.6)

## 2010-05-06 LAB — COMPREHENSIVE METABOLIC PANEL
ALT: 16 U/L (ref 0–35)
AST: 27 U/L (ref 0–37)
Calcium: 9.1 mg/dL (ref 8.4–10.5)
GFR calc Af Amer: >60 mL/min (ref >60)
Sodium: 143 mEq/L (ref 135–145)
Total Protein: 6 g/dL (ref 6.0–8.3)

## 2010-05-06 LAB — BRAIN NATRIURETIC PEPTIDE
Pro B Natriuretic peptide (BNP): 550 pg/mL — ABNORMAL HIGH (ref 0.0–100.0)
Pro B Natriuretic peptide (BNP): 673 pg/mL — ABNORMAL HIGH (ref 0.0–100.0)

## 2010-05-06 LAB — HEMOGLOBIN A1C: Hgb A1c MFr Bld: 5.3 % (ref <5.7)

## 2010-05-06 LAB — LIPID PANEL
HDL: 38 mg/dL — ABNORMAL LOW (ref >39)
VLDL: 32 mg/dL (ref 0–40)

## 2010-05-06 LAB — PROTIME-INR: Prothrombin Time: 14.1 seconds (ref 11.6–15.2)

## 2010-05-06 LAB — PROCALCITONIN: Procalcitonin: <0.1 ng/mL

## 2010-05-06 LAB — TSH: TSH: 1.663 u[IU]/mL (ref 0.350–4.500)

## 2010-05-11 ENCOUNTER — Encounter (INDEPENDENT_AMBULATORY_CARE_PROVIDER_SITE_OTHER): Payer: Self-pay | Admitting: *Deleted

## 2010-05-11 LAB — POCT URINALYSIS DIP (DEVICE)
Glucose, UA: NEGATIVE mg/dL
Nitrite: POSITIVE — AB
Protein, ur: 100 mg/dL — AB
Specific Gravity, Urine: 1.015 (ref 1.005–1.030)
Urobilinogen, UA: 0.2 mg/dL (ref 0.0–1.0)

## 2010-05-11 LAB — URINE CULTURE

## 2010-05-24 LAB — DIFFERENTIAL
Basophils Relative: 1 % (ref 0–1)
Eosinophils Relative: 2 % (ref 0–5)
Monocytes Absolute: 0.7 10*3/uL (ref 0.1–1.0)
Monocytes Relative: 9 % (ref 3–12)
Neutro Abs: 5.5 10*3/uL (ref 1.7–7.7)

## 2010-05-24 LAB — CBC
HCT: 35.6 % — ABNORMAL LOW (ref 36.0–46.0)
Hemoglobin: 12.2 g/dL (ref 12.0–15.0)
MCHC: 34.3 g/dL (ref 30.0–36.0)
RBC: 3.97 MIL/uL (ref 3.87–5.11)
RDW: 14.4 % (ref 11.5–15.5)

## 2010-05-24 LAB — BASIC METABOLIC PANEL
CO2: 22 mEq/L (ref 19–32)
Calcium: 9.2 mg/dL (ref 8.4–10.5)
Chloride: 104 mEq/L (ref 96–112)
GFR calc Af Amer: 30 mL/min — ABNORMAL LOW (ref >60)
Glucose, Bld: 70 mg/dL (ref 70–99)
Potassium: 5 mEq/L (ref 3.5–5.1)
Sodium: 135 mEq/L (ref 135–145)

## 2010-05-27 LAB — PROTIME-INR
INR: 0.91 (ref 0.00–1.49)
Prothrombin Time: 12.2 seconds (ref 11.6–15.2)

## 2010-05-27 LAB — POCT I-STAT, CHEM 8
Creatinine, Ser: 0.7 mg/dL (ref 0.4–1.2)
Creatinine, Ser: 1.2 mg/dL (ref 0.4–1.2)
Glucose, Bld: 144 mg/dL — ABNORMAL HIGH (ref 70–99)
Hemoglobin: 11.9 g/dL — ABNORMAL LOW (ref 12.0–15.0)
Hemoglobin: 13.9 g/dL (ref 12.0–15.0)
Sodium: 138 mEq/L (ref 135–145)
TCO2: 25 mmol/L (ref 0–100)
TCO2: 28 mmol/L (ref 0–100)

## 2010-05-27 LAB — DIFFERENTIAL
Basophils Absolute: 0 10*3/uL (ref 0.0–0.1)
Basophils Relative: 0 % (ref 0–1)
Monocytes Absolute: 0.5 10*3/uL (ref 0.1–1.0)
Neutro Abs: 8 10*3/uL — ABNORMAL HIGH (ref 1.7–7.7)
Neutrophils Relative %: 85 % — ABNORMAL HIGH (ref 43–77)

## 2010-05-27 LAB — CBC
MCHC: 34.3 g/dL (ref 30.0–36.0)
Platelets: 270 10*3/uL (ref 150–400)
RDW: 13.6 % (ref 11.5–15.5)

## 2010-05-27 LAB — URINALYSIS, ROUTINE W REFLEX MICROSCOPIC
Bilirubin Urine: NEGATIVE
Ketones, ur: NEGATIVE mg/dL
Specific Gravity, Urine: 1.022 (ref 1.005–1.030)
Urobilinogen, UA: 0.2 mg/dL (ref 0.0–1.0)
pH: 5.5 (ref 5.0–8.0)

## 2010-05-27 LAB — URINE CULTURE
Colony Count: NO GROWTH
Culture: NO GROWTH

## 2010-05-27 LAB — POCT CARDIAC MARKERS
Myoglobin, poc: 107 ng/mL (ref 12–200)
Myoglobin, poc: 108 ng/mL (ref 12–200)

## 2010-05-27 LAB — URINE MICROSCOPIC-ADD ON

## 2010-06-01 LAB — COMPREHENSIVE METABOLIC PANEL
ALT: 13 U/L (ref 0–35)
AST: 21 U/L (ref 0–37)
Albumin: 3.4 g/dL — ABNORMAL LOW (ref 3.5–5.2)
Alkaline Phosphatase: 100 U/L (ref 39–117)
BUN: 23 mg/dL (ref 6–23)
GFR calc Af Amer: >60 mL/min (ref >60)
Potassium: 3.9 mEq/L (ref 3.5–5.1)
Sodium: 139 mEq/L (ref 135–145)
Total Protein: 6.7 g/dL (ref 6.0–8.3)

## 2010-06-01 LAB — CBC
HCT: 28 % — ABNORMAL LOW (ref 36.0–46.0)
HCT: 26.5 % — ABNORMAL LOW (ref 36.0–46.0)
HCT: 28.5 % — ABNORMAL LOW (ref 36.0–46.0)
Hemoglobin: 8.6 g/dL — ABNORMAL LOW (ref 12.0–15.0)
Hemoglobin: 9.1 g/dL — ABNORMAL LOW (ref 12.0–15.0)
Hemoglobin: 9.4 g/dL — ABNORMAL LOW (ref 12.0–15.0)
MCHC: 31.8 g/dL (ref 30.0–36.0)
MCHC: 32.9 g/dL (ref 30.0–36.0)
MCV: 78.5 fL (ref 78.0–100.0)
Platelets: 341 10*3/uL (ref 150–400)
Platelets: 360 10*3/uL (ref 150–400)
Platelets: 407 10*3/uL — ABNORMAL HIGH (ref 150–400)
Platelets: 421 10*3/uL — ABNORMAL HIGH (ref 150–400)
RBC: 3.47 MIL/uL — ABNORMAL LOW (ref 3.87–5.11)
RBC: 3.56 MIL/uL — ABNORMAL LOW (ref 3.87–5.11)
RBC: 3.42 MIL/uL — ABNORMAL LOW (ref 3.87–5.11)
RDW: 15.3 % (ref 11.5–15.5)
RDW: 15.4 % (ref 11.5–15.5)
RDW: 15.6 % — ABNORMAL HIGH (ref 11.5–15.5)
RDW: 15.9 % — ABNORMAL HIGH (ref 11.5–15.5)
RDW: 15.9 % — ABNORMAL HIGH (ref 11.5–15.5)
WBC: 7.7 10*3/uL (ref 4.0–10.5)

## 2010-06-01 LAB — BASIC METABOLIC PANEL
BUN: 19 mg/dL (ref 6–23)
BUN: 16 mg/dL (ref 6–23)
CO2: 27 mEq/L (ref 19–32)
CO2: 28 mEq/L (ref 19–32)
Calcium: 9.4 mg/dL (ref 8.4–10.5)
Calcium: 9 mg/dL (ref 8.4–10.5)
Chloride: 105 mEq/L (ref 96–112)
Chloride: 105 mEq/L (ref 96–112)
Chloride: 105 mEq/L (ref 96–112)
Creatinine, Ser: 0.85 mg/dL (ref 0.4–1.2)
GFR calc Af Amer: >60 mL/min (ref >60)
GFR calc Af Amer: >60 mL/min (ref >60)
GFR calc Af Amer: >60 mL/min (ref >60)
GFR calc Af Amer: >60 mL/min (ref >60)
GFR calc non Af Amer: >60 mL/min (ref >60)
GFR calc non Af Amer: >60 mL/min (ref >60)
GFR calc non Af Amer: >60 mL/min (ref >60)
Glucose, Bld: 130 mg/dL — ABNORMAL HIGH (ref 70–99)
Potassium: 3.6 mEq/L (ref 3.5–5.1)
Potassium: 3.9 mEq/L (ref 3.5–5.1)
Sodium: 138 mEq/L (ref 135–145)
Sodium: 142 mEq/L (ref 135–145)

## 2010-06-01 LAB — IRON AND TIBC
Saturation Ratios: 2 % — ABNORMAL LOW (ref 20–55)
UIBC: 593 ug/dL

## 2010-06-01 LAB — CROSSMATCH
ABO/RH(D): O POS
Antibody Screen: NEGATIVE

## 2010-06-01 LAB — HEMOGLOBIN AND HEMATOCRIT, BLOOD
HCT: 28.8 % — ABNORMAL LOW (ref 36.0–46.0)
Hemoglobin: 11.6 g/dL — ABNORMAL LOW (ref 12.0–15.0)

## 2010-06-01 LAB — POCT I-STAT, CHEM 8
Calcium, Ion: 1.06 mmol/L — ABNORMAL LOW (ref 1.12–1.32)
HCT: 25 % — ABNORMAL LOW (ref 36.0–46.0)
Hemoglobin: 8.5 g/dL — ABNORMAL LOW (ref 12.0–15.0)
Sodium: 137 mEq/L (ref 135–145)
TCO2: 25 mmol/L (ref 0–100)

## 2010-06-01 LAB — PHOSPHORUS
Phosphorus: 3.5 mg/dL (ref 2.3–4.6)
Phosphorus: 4.1 mg/dL (ref 2.3–4.6)

## 2010-06-01 LAB — GLUCOSE, CAPILLARY
Glucose-Capillary: 102 mg/dL — ABNORMAL HIGH (ref 70–99)
Glucose-Capillary: 104 mg/dL — ABNORMAL HIGH (ref 70–99)
Glucose-Capillary: 139 mg/dL — ABNORMAL HIGH (ref 70–99)
Glucose-Capillary: 140 mg/dL — ABNORMAL HIGH (ref 70–99)
Glucose-Capillary: 144 mg/dL — ABNORMAL HIGH (ref 70–99)
Glucose-Capillary: 162 mg/dL — ABNORMAL HIGH (ref 70–99)

## 2010-06-01 LAB — POCT CARDIAC MARKERS: Troponin i, poc: <0.05 ng/mL (ref 0.00–0.09)

## 2010-06-01 LAB — DIFFERENTIAL
Basophils Relative: 1 % (ref 0–1)
Lymphocytes Relative: 17 % (ref 12–46)
Monocytes Absolute: 0.6 10*3/uL (ref 0.1–1.0)
Monocytes Absolute: 0.8 10*3/uL (ref 0.1–1.0)
Monocytes Relative: 8 % (ref 3–12)
Monocytes Relative: 10 % (ref 3–12)
Neutro Abs: 5.3 10*3/uL (ref 1.7–7.7)
Neutro Abs: 5.8 10*3/uL (ref 1.7–7.7)

## 2010-06-01 LAB — RETICULOCYTES: Retic Ct Pct: 3.3 % — ABNORMAL HIGH (ref 0.4–3.1)

## 2010-06-01 LAB — CARDIAC PANEL(CRET KIN+CKTOT+MB+TROPI)
CK, MB: 0.6 ng/mL (ref 0.3–4.0)
Relative Index: INVALID (ref 0.0–2.5)
Total CK: 23 U/L (ref 7–177)
Troponin I: 0.01 ng/mL (ref 0.00–0.06)
Troponin I: 0.01 ng/mL (ref 0.00–0.06)

## 2010-06-01 LAB — MAGNESIUM
Magnesium: 1.7 mg/dL (ref 1.5–2.5)
Magnesium: 1.7 mg/dL (ref 1.5–2.5)

## 2010-06-01 LAB — HEMOCCULT GUIAC POC 1CARD (OFFICE): Fecal Occult Bld: POSITIVE

## 2010-06-01 LAB — CK TOTAL AND CKMB (NOT AT ARMC)
Relative Index: INVALID (ref 0.0–2.5)
Total CK: 28 U/L (ref 7–177)

## 2010-06-01 LAB — VITAMIN B12: Vitamin B-12: 1040 pg/mL — ABNORMAL HIGH (ref 211–911)

## 2010-06-01 LAB — FOLATE: Folate: >20 ng/mL

## 2010-06-01 LAB — PREPARE RBC (CROSSMATCH)

## 2010-06-23 ENCOUNTER — Encounter: Payer: Self-pay | Admitting: Hematology and Oncology

## 2010-06-23 DIAGNOSIS — F17200 Nicotine dependence, unspecified, uncomplicated: Secondary | ICD-10-CM

## 2010-06-23 DIAGNOSIS — D509 Iron deficiency anemia, unspecified: Secondary | ICD-10-CM

## 2010-06-23 DIAGNOSIS — K922 Gastrointestinal hemorrhage, unspecified: Secondary | ICD-10-CM

## 2010-06-23 LAB — CBC WITH DIFFERENTIAL/PLATELET
Basophils Absolute: 0 10*3/uL (ref 0.0–0.1)
EOS%: 2.4 % (ref 0.0–7.0)
Eosinophils Absolute: 0.2 10*3/uL (ref 0.0–0.5)
LYMPH%: 16.7 % (ref 14.0–49.7)
MCH: 31.4 pg (ref 25.1–34.0)
MCV: 90.8 fL (ref 79.5–101.0)
MONO%: 9.4 % (ref 0.0–14.0)
Platelets: 250 10*3/uL (ref 145–400)
RBC: 4.3 10*6/uL (ref 3.70–5.45)
RDW: 12.5 % (ref 11.2–14.5)

## 2010-06-23 LAB — BASIC METABOLIC PANEL
BUN: 35 mg/dL — ABNORMAL HIGH (ref 6–23)
Calcium: 9.5 mg/dL (ref 8.4–10.5)
Glucose, Bld: 192 mg/dL — ABNORMAL HIGH (ref 70–99)
Potassium: 3.7 mEq/L (ref 3.5–5.3)
Sodium: 139 mEq/L (ref 135–145)

## 2010-06-23 LAB — IRON AND TIBC
Iron: 86 ug/dL (ref 42–145)
UIBC: 342 ug/dL

## 2010-06-23 LAB — FERRITIN: Ferritin: 149 ng/mL (ref 10–291)

## 2010-06-30 ENCOUNTER — Encounter (HOSPITAL_BASED_OUTPATIENT_CLINIC_OR_DEPARTMENT_OTHER): Payer: Self-pay | Admitting: Hematology and Oncology

## 2010-06-30 DIAGNOSIS — F17200 Nicotine dependence, unspecified, uncomplicated: Secondary | ICD-10-CM

## 2010-06-30 DIAGNOSIS — K922 Gastrointestinal hemorrhage, unspecified: Secondary | ICD-10-CM

## 2010-06-30 DIAGNOSIS — D500 Iron deficiency anemia secondary to blood loss (chronic): Secondary | ICD-10-CM

## 2010-06-30 DIAGNOSIS — D509 Iron deficiency anemia, unspecified: Secondary | ICD-10-CM

## 2010-07-06 NOTE — Progress Notes (Signed)
[facility]                        PERIPHERAL VASCULAR OFFICE NOTE   NAME:[name], [name]                        MRN:          [id number]  DATE:06/26/2006                            DOB:          11/26/1950    PRIMARY CARE PHYSICIAN:  [name], M.D.   PRIMARY CARDIOLOGIST:  [name], M.D.   HISTORY OF PRESENT ILLNESS:  [name] is a 60 year old lady with  atherosclerotic peripheral arterial disease. She is status post stenting  of her left common and external iliac arteries in October 2007. ABI  improved to 0.70. She remains without any symptoms of claudication. She  does have substantial SFA disease bilaterally.   ABIs today are 0.62 on the left and 0.64 on the right. These are  unchanged within the limits of the test.   MEDICATIONS:  1. Lasix 80 mg daily.  2. Kay Ciel 10 mEq daily.  3. Toprol XL 25 mg daily.  4. Teveten 300 mg daily.  5. Iron twice daily.  6. Pravachol 40 mg q.h.s.  7. Lopid 600 mg twice daily.  8. Zyprexa 2.5 mg daily.  9. Zoloft 100 mg daily.  10.Actos 15 mg daily.  11.Metformin 500 mg daily.  12.Altace 10 mg daily.  13.Aspirin 325 mg daily.   PHYSICAL EXAMINATION:  GENERAL:  She is generally well-appearing in no  distress.  VITAL SIGNS:  Heart rate 64, blood pressure 108/52 and weight of 188  pounds.  NECK:  She has no jugular venous distention, thyromegaly or  lymphadenopathy.  LUNGS:  Clear to auscultation. Respiratory effort is normal.  CARDIAC:  She has a nondisplaced point of maximal cardiac impulse. There  is a regular rate and rhythm without murmurs, rubs or gallops.  ABDOMEN:  Obese, soft, nondistended, nontender. No hepatosplenomegaly  and no pulsatile midline mass. Bowel sounds are normal.  EXTREMITIES:  Warm without clubbing, cyanosis, edema or ulceration.  Femoral pulses 1+ on the left and trace on the right.   IMPRESSION/RECOMMENDATIONS:  1. Lower extremity peripheral arterial  disease:  Doing nicely after      stenting of the left common and external iliac arteries.  2. Coronary disease managed by Dr. Antoine [name].  3. Diabetes mellitus.  4. Hypertension.  5. Chronic anemia.  6. Obesity.  7. Mitral regurgitation.  8. Atherosclerotic cerebrovascular disease managed by Dr. Madilyn [name].   She is doing well. Will not have her back for peripheral followup but  followup with Dr. Antoine [name].     Salvadore Farber, MD  Electronically Signed    WED/MedQ  DD: 06/26/2006  DT: 06/26/2006  Job #: [id number]

## 2010-07-06 NOTE — Op Note (Signed)
NAME:  [name], [name] NO.:  [id number]   MEDICAL RECORD NO.:  [id number]          PATIENT TYPE:  AMB   LOCATION:  DSC                          FACILITY:  [facility]   PHYSICIAN:  Dyke Brackett, M.D.    DATE OF BIRTH:  07-04-1950   DATE OF PROCEDURE:  01/09/2007  DATE OF DISCHARGE:                               OPERATIVE REPORT   INDICATIONS:  This is a 60 year old with an MRI proven frozen shoulder  with cuff pathology thought to be amenable to outpatient surgery.   PREOPERATIVE DIAGNOSES:  1. Frozen shoulder.  2. Impingement, partial rotator cuff tear.  3. AC joint arthritis.  4. Labral tear.   POSTOPERATIVE DIAGNOSES:  1. Frozen shoulder.  2. Impingement, partial rotator cuff tear.  3. AC joint arthritis.  4. Labral tear.   OPERATION:  1. Arthroscopic acromioplasty.  2. Arthroscopic debridement torn labrum.  3. Arthroscopic lysis of adhesions.  4. __________ under distal clavicle.  5. Closed manipulation under anesthesia, all for the right shoulder.   SURGEON:  Dyke Brackett, M.D.   ANESTHESIA:  General with a block.   DESCRIPTION OF PROCEDURE:  She was arthroscoped through a posterior  lateral and anterior portal prior to arthroscopic evaluation.  She was  manipulated, premanipulation abduction about 70, post 180, external  rotation pre- probably 30, post 90, internal rotation 20, post 60-70.  There was certainly hyperemia in the capsule and a degenerative tearing  of the labrum which was debrided.  Undersurface of the cuff showed no  very small partial cuff tear that was debrided again.  There was  moderate impingement and a lot of bursal hypertrophy and inflammation  which was resected, moderate impingement from the acromion was noted and  the edge of the acromion was burred.  Significant AC joint arthropathy  was noted and distal clavicle excision was carried out.  This relieved  the  impingement nicely, complete bursectomy was carried out.  The  superior  surface of the cuff showed no significant problems.  Shoulder drained  free of fluid.  Portals closed with nylon.  Lightly compressive sterile  dressing applied, sling, taken to recovery room in stable condition.      Dyke Brackett, M.D.  Electronically Signed     WDC/MEDQ  D:  01/09/2007  T:  01/09/2007  Job:  [id number]

## 2010-07-06 NOTE — H&P (Signed)
NAMECALLEEN, ALVIS NO.:  [id number]   MEDICAL RECORD NO.:  [id number]          PATIENT TYPE:  INP   LOCATION:                               FACILITY:  [facility]   PHYSICIAN:  Barbette Hair. Arlyce Dice, MD,FACGDATE OF BIRTH:  [birth year]   DATE OF ADMISSION:  06/27/2008  DATE OF DISCHARGE:  06/29/2008                              HISTORY & PHYSICAL   REASON FOR CONSULTATION:  Anemia.   HISTORY OF PRESENT ILLNESS:  [name] is a 60 year old white female  well known to the GI Service, admitted with symptomatic anemia.  She has  a history of an iron deficiency anemia, for which she has undergone a  thorough workup in the past.  This includes push enteroscopy in August  2005; capsule endoscopy in August 2003, endoscopy in 2005, colonoscopy  in 2008.  She was felt to have erosions and/or AVMs of the small  intestine on a regimen of IV infusions of iron and Epogen in the past  that she has been able to maintain her blood counts.  Epogen was  discontinued because of lack of insurance coverage.  She denies overt  rectal bleeding or melena.  Stools are dark from iron supplementation.  She was admitted with weakness and severe anemia.   PAST MEDICAL HISTORY:  Pertinent for diabetes, hypertension,  hyperlipidemia, tobacco abuse, peripheral vascular disease, coronary  artery disease with prior myocardial infarction and heart failure.   FAMILY HISTORY:  Noncontributory.   PHYSICAL EXAMINATION:  GENERAL:  She is a well-developed, well-nourished  female.  HEENT:  Within normal limits.  CHEST:  Clear with no cardiac murmurs, gallops, or rubs.  ABDOMEN:  Without masses, tenderness, or organomegaly.   Her hemoglobin on admission was 7.8, platelet count 421, MCV 82.5.  Electrolytes, BUN, and creatinine were normal.   IMPRESSION:  Chronic iron deficiency anemia.  This is undoubtedly due to  persistent arteriovenous malformations or possible erosions in the small  bowel.  She has  chronic gastrointestinal blood loss, which would account  for a hemoccult-positive stool.   RECOMMENDATIONS:  This is an old problem that has been thoroughly worked  up.  There is nothing clinically to suggest a different or a new GI  bleeding source.  Ideally, she would be placed back on either Epogen or  Procrit while receiving supplementary iron.  In the interim, I would  transfuse her to hemoglobin of 9-10 and continue PPI therapy.      Barbette Hair. Arlyce Dice, MD,FACG  Electronically Signed    RDK/MEDQ  D:  06/28/2008  T:  06/29/2008  Job:  [id number]

## 2010-07-06 NOTE — Assessment & Plan Note (Signed)
[facility]                            CARDIOLOGY OFFICE NOTE   NAME:[name], [name]                        MRN:          [id number]  DATE:04/26/2007                            DOB:          30-Dec-1950    PRIMARY CARE PHYSICIAN:  Dr. Jacalyn Lefevre.   REASON FOR VISIT:  Evaluate patient with coronary disease.   HISTORY OF PRESENT ILLNESS:  The patient is 60 years old.  She presents  for yearly follow-up.  Since I last saw her, she reports no further  cardiovascular complications.  She has had no chest discomfort, neck or  arm discomfort.  She has had no palpitations, presyncope or syncope.  She has had no PND or orthopnea.  She does not exercise but she is  active doing chores.   PAST MEDICAL HISTORY:  Congestive heart failure with a well-preserved  ejection fraction (65%), moderate mitral regurgitation, hypertension,  chronic anemia, tobacco abuse, coronary artery disease (50% left main  stenosis, ostial 60% LAD stenosis, 30% ramus intermediate stenosis,  occluded right artery with left-to-right collaterals), peripheral  vascular disease status post PCI and stenting of her left lower  extremity by Dr. Samule Ohm, status post left carotid endarterectomy by Dr.  Madilyn Fireman, obesity.   ALLERGIES:  None.   MEDICATIONS:  1. Lasix 80 mg daily.  2. Potassium 10 mEq daily.  3. Toprol 25 mg daily.  4. Teveten 300 mg daily.  5. Iron 325 mg b.i.d.  6. Lopid 600 mg b.i.d.  7. Zyprexa 2.5 mg daily.  8. Zoloft 100 mg daily.  9. Metformin 500 mg daily.  10.Altace 10 mg daily.  11.Aspirin 325 mg daily.  12.Pravachol 40 mg daily.  13.Procrit.  14.Actos.   REVIEW OF SYSTEMS:  As stated in the HPI and otherwise negative for  other systems.   PHYSICAL EXAMINATION:  GENERAL:  The patient is in no distress.  VITAL SIGNS:  Blood pressure 142/76, heart rate 64 and regular, weight  186 pounds, body mass index 38.  HEENT:  Eyelids unremarkable. Pupils equal, round  and reactive to light.  Fundi not visualized, oral mucosa unremarkable.  NECK:  No jugular venous distention at 45 degrees, carotid upstroke  brisk and symmetric, bilateral carotid bruits, left carotid  endarterectomy scar, no thyromegaly.  LYMPHATICS:  No cervical, axillary or inguinal adenopathy.  LUNGS:  Clear to auscultation bilaterally.  BACK:  No costovertebral angle tenderness..  CHEST:  Unremarkable.  HEART:  PMI not displaced or sustained, S1 and S2 within normal limits.  No S3, no S4, no clicks, no rubs, 2/6 apical early peaking systolic  murmur nonradiating, no diastolic murmurs.  ABDOMEN:  Obese, positive bowel sounds, normal in frequency and pitch,  no bruits, no rebound, no guarding, no midline pulsatile mass, no  hepatomegaly, no splenomegaly.  SKIN:  No rashes, no nodules.  EXTREMITIES:  2+ upper pulses, absent dorsalis pedis and posterior  tibialis bilaterally. No cyanosis, no clubbing, no edema.  NEURO:  Oriented to person, place and  time, cranial nerves II-XII  grossly intact, motor grossly intact.   EKG:  Sinus  rhythm, rate 64, axis within normal limits, interval is  within normal limits, old inferior infarct, premature ventricular  contraction, no acute ST-T wave change.   ASSESSMENT/PLAN:  1. Coronary disease.  The patient is having no ongoing symptoms.  No      further cardiovascular testing is suggested.  She will continue      with risk reduction.  2. Peripheral vascular disease.  I do not see carotid Dopplers in      quite a while.  I will go ahead and order these.  She has had her      peripheral disease treated and evaluate by Dr. Samule Ohm and he      suggested no follow-up without symptoms.  Will continue with risk      reduction.  3. Hypertension.  Blood pressure is well-controlled.  She will      continue the medications as listed.  4. Moderate mitral regurgitation. I do not see any clinical indication      for reevaluating this.  We will follow this  clinically and      symptomatically.  5. Obesity.  She understands the need to lose weight with diet and      exercise.  6. Dyslipidemia per Dr. Jacalyn Lefevre with  goal LDL less than 70 and      HDL greater than 50.  7. Follow-up. Will see her back in 1 year or sooner if needed.     Rollene Rotunda, MD, [facility]  Electronically Signed    JH/MedQ  DD: 04/26/2007  DT: 04/27/2007  Job #: [id number]   cc:   Frederica Kuster, MD

## 2010-07-06 NOTE — Discharge Summary (Signed)
[name], [name]                 ACCOUNT NO.:  [id number]   MEDICAL RECORD NO.:  [id number]          PATIENT TYPE:  INP   LOCATION:  5504                         FACILITY:  [facility]   PHYSICIAN:  Hind I Elsaid, MD      DATE OF BIRTH:  03-01-1950   DATE OF ADMISSION:  07/18/2008  DATE OF DISCHARGE:  07/19/2008                               DISCHARGE SUMMARY   PRIMARY CARE PHYSICIAN:  [name]. Hyacinth Meeker, MD   DISCHARGE DIAGNOSES:  1. Severe iron deficiency anemia secondary to chronic gastrointestinal      bleeding.  2. Chronic gastrointestinal bleeding.  3. Coronary artery disease.  4. History of diastolic congestive heart failure.  5. Peripheral vascular disease.  6. Diabetes mellitus.  7. Hypertension.  8. Hyperlipidemia.   DISCHARGE MEDICATIONS:  1. Metoprolol 25 mg twice daily.  2. Glucophage 1000 mg twice daily.  3. Altace 10 mg daily.  4. Ferrous sulfate 325 mg 4 times daily.  5. Folic acid 1 mg p.o. daily.  6. Gemfibrozil 600 mg twice daily.  7. Pravastatin 40 daily.  8. Furosemide 80 mg daily.  9. Potassium pills 1 tablet daily.  10.Fish oil 1 tablet 3 times daily.  11.Aspirin 81 mg daily.  12.Calcium 600 mg twice daily.  13.Carafate 1 g 4 times daily.  14.Multivitamin 1 tablet daily.  15.Alprazolam 0.25 mg 3 times daily.  16.Zoloft 100 mg daily.   CONSULTATION:  None.   HOSPITAL COURSE:  This is a 60 year old recently discharged from the  hospital on May 9 after symptomatic anemia status post blood  transfusion.  The patient was did seen by GI where she had extensive  workup in the past and GI feels that there is no additional intervention  needed.  The patient discharged with iron supplements.  The patient was  asked to follow up with her primary care physician.  The patient  apparently was under Dr. Dalene Carrow care where the patient received iron  injection.  The patient stopped taking iron and started on Procrit, but  apparently insurance rejected her Procrit  injection.  The patient  admitted at this time with symptomatic anemia with hemoglobin of 8.6  status post 1 unit of blood transfusion, hemoglobin came back to 9.2.  The patient was symptomatic.  The patient also received Procrit  injection during hospital stay and symptoms completely resolved.  The  patient was advised to follow with Dr. Jacalyn Lefevre and follow with  Dr. Dalene Carrow for possible iron injection.  The patient most probably will  need to have regular IV iron infusion at the [facility].  The patient  was asked to follow up with Dr. Dalene Carrow at the [facility] and phone  number was given to the patient for followup and also was asked to  follow up with Dr. Jimmye Norman.  We felt the patient is medically  stable to be discharged home and follow with Dr. Dalene Carrow as an  outpatient.  Follow with Dr. Jimmye Norman as an outpatient on Tuesday.      Hind I Eda Paschal, MD  Electronically Signed  HIE/MEDQ  D:  07/19/2008  T:  07/20/2008  Job:  [id number]

## 2010-07-06 NOTE — Discharge Summary (Signed)
NAMETARITA, [name] NO.:  [id number]   MEDICAL RECORD NO.:  [id number]          PATIENT TYPE:  INP   LOCATION:  5511                         FACILITY:  [facility]   PHYSICIAN:  Renee Ramus, MD       DATE OF BIRTH:  [birth year]   DATE OF ADMISSION:  06/27/2008  DATE OF DISCHARGE:  06/29/2008                               DISCHARGE SUMMARY   PRIMARY CARE PHYSICIAN:  [name]. Hyacinth Meeker, MD   PRIMARY DISCHARGE DIAGNOSIS:  Anemia secondary to iron deficiency and  chronic gastrointestinal bleed.   SECONDARY DIAGNOSES:  1. Chronic gastrointestinal bleed.  2. Coronary artery disease.  3. Diastolic heart failure.  4. Peripheral vascular disease.  5. Diabetes mellitus type 2.  6. Hypertension.  7. Hyperlipidemia.  8. Hypokalemia.  9. Depression.   HOSPITAL COURSE:  1. Anemia.  The patient is a 60 year old female who was admitted      secondary to severe fatigue and weakness and dyspnea on exertion.      The patient was seen in the emergency department.  There, she was      found to be profoundly anemic with hemoglobin of 7.8 and hematocrit      23.4.  The patient was given 1 unit of packed red blood cells.  She      was heme positive for her stools and had iron deficiency in her      laboratories.  The patient has been seen by GI several times for      workup of an occult GI bleed.  She has undergone regular endoscopy      and colonoscopy and capsule endoscopy, and they have not found the      source of her bleeding.  The patient is on aspirin secondary to      peripheral vascular disease and coronary artery disease.  GI was      consulted.  They felt that there was no additional interventions      that they could at this time.  She has recovered well with the      transfusion.  Her iron supplements will be increased and she is to      have labs done on Jul 03, 2008 including a CBC, CMP, iron,      ferritin, TIBC, and retic count.  These will be sent to Dr. Jacalyn Lefevre and he will decide whether or not she requires outpatient IV      iron infusion.  The patient has had a bone marrow biopsy.  This      showed a profound iron deficiency anemia and she has required iron      transfusions in the past.  The patient is well aware of these      instructions and will follow up with them post-discharge.  2. Lower GI bleed, as above.  3. Coronary artery disease.  The patient will continue on her beta      blocker and aspirin, although her aspirin will be decreased to 81      mg p.o. daily.  The patient has no evidence of acute coronary      syndrome.  4. Diastolic heart failure, as above.  5. Peripheral vascular disease, as above.  The patient will have her      aspirin decreased to 81 mg p.o. daily.  6. Hypokalemia, currently stable on potassium supplements.  7. Depression, currently stable.  8. Hyperlipidemia.  The patient will continue statin therapy.   LABORATORY DATA:  1. Admission hemoglobin of 7.8 increasing to 9.5 on day of discharge.  2. MCV of 83.2.  3. Retic count of 3.3.  4. INR of 1.1.  5. Blood glucose ranging from 110-162.  6. Mild hypokalemia with potassium of 3.4 increasing to 3.9 on day of      discharge.  7. Normal BUN and creatinine with a BUN of 18 and creatinine of 0.74.  8. Negative troponins x3.  9. BNP slightly elevated at 337.  10.Iron of 14 with a ferritin of 12.  11.Fecal occult blood is positive.   STUDIES:  Echocardiogram done on 2008-06-04 showing left ventricular  ejection fraction at 60% with no regional wall motion abnormalities and  no noticeable valvular abnormalities.  There were no further studies  done.   MEDICATIONS ON DISCHARGE:  1. Altace 10 mg p.o. daily.  2. Zoloft 100 mg p.o. daily.  3. Glucophage 500 mg p.o. q.a.m.  4. Aspirin which has been decreased to 81 mg p.o. daily.  5. Lopid 600 mg p.o. b.i.d.  6. Lasix 80 mg p.o. daily.  7. Pravachol 40 mg p.o. daily.  8. Potassium chloride 10 mEq  p.o. daily.  9. Procrit monthly, although she is not currently taking this.  We      have discussed this issue be evaluated to resume this.  10.Toprol-XL 25 mg p.o. daily.  11.Iron sulfate 325 mg increased to t.i.d.   PENDING LABORATORY WORK AND STUDIES:  There are no labs or studies  pending at time of discharge.   CONDITION ON DISCHARGE:  The patient is in stable condition and anxious  for discharge.   TIME SPENT:  35 minutes.      Renee Ramus, MD  Electronically Signed     JF/MEDQ  D:  06/29/2008  T:  06/30/2008  Job:  [id number]   cc:   [name]. Hyacinth Meeker, M.D.

## 2010-07-06 NOTE — Consult Note (Signed)
NAME:  MIKEA, QUADROS NO.:  [id number]   MEDICAL RECORD NO.:  [id number]          PATIENT TYPE:  OBV   LOCATION:  5040                         FACILITY:  [facility]   PHYSICIAN:  Marcellus Scott, MD     DATE OF BIRTH:  [birth year]   DATE OF CONSULTATION:  DATE OF DISCHARGE:                                 CONSULTATION   PRIMARY MEDICAL DOCTOR:  Frederica Kuster, M.D. of [facility].   CARDIOLOGIST:  Rollene Rotunda, MD, [facility].   REFERRING PHYSICIAN:  Dyke Brackett, M.D.   REASON FOR REFERRAL:  1. Postoperative hypoxia.  2. ? preoperative seizures.   HISTORY OF PRESENT ILLNESS:  Ms. Pleitez is a pleasant 60 year old  Caucasian female patient with extensive past medical history of coronary  artery disease, congestive heart failure with preserved LV ejection  fraction, hypertension, diabetes and COPD.  She came in this morning,  for an elective right shoulder procedure for frozen shoulder and  partially torn rotator cuff.  There is an unclear history of  preoperative seizure this am.  There is no clear documentation of  seizures that I am able to retrieve from the chart.  In any case, the  patient had right shoulder arthroscopy, acromioplasty, debridement and  manipulation.  This was done under general anesthesia.  In any event,  postoperatively, the patient is said to have dropped her oxygen  saturation to the low 90s.  She was placed on oxygen via nasal cannula,  and the oxygen saturation has improved.  Subsequently chest x-ray showed  increased right basilar infiltrate versus atelectasis and mild left  basilar atelectasis.  We were asked to see the patient for evaluation  and management.   Patient currently indicates that she has minimal dry cough and  intermittent occasional dyspnea, but she denies any fever, chills,  rigors, chest pain, or severe dyspnea.  Patient indicates that prior to  coming to the hospital, she did not have any effort intolerance,  cough  or chest pain.  She denies any other complaints.   PAST MEDICAL HISTORY:  1. Peripheral vascular disease, status post stenting of the left      common iliac and external iliac arteries.  2. Coronary artery disease.  Stress test several years ago said to be      negative.  3. Congestive heart failure with preserved LV ejection fraction.  4. Moderate mitral regurgitation.  5. Hypertension.  6. Chronic anemia.  7. Tobacco abuse.  8. Obesity.  9. TIA.  10.Dyslipidemia.  11.Type 2 diabetes.  12.COPD.   PAST SURGICAL HISTORY:  1. Status post left common iliac and external iliac artery stents      secondary to atherosclerotic peripheral vascular disease.  2. Left carotid endarterectomy.  3. Cholecystectomy.  4. Cesarean section x3.   PAST SURGICAL HISTORY:  1. Anxiety.  2. Depression.   ALLERGIES:  No known drug allergies.   MEDICATIONS:  1. Lasix 80 mg p.o. daily.  2. Zyprexa 2.5 mg p.o. daily.  3. Toprol XL 25 mg p.o. daily.  4. Iron 325 mg p.o. b.i.d.  5. Pravachol 40 mg p.o. nightly.  6. Potassium chloride 10 mEq p.o. daily.  7. Glucophage 500 mg p.o. daily.  8. Actos 30 mg p.o. daily.  9. Multivitamin 1 p.o. daily.  10.Altace 10 mg p.o. daily.  11.Lopid 600 mg p.o. b.i.d.  12.Zoloft 100 mg p.o. daily.  13.Procrit injections every 2 weeks.   FAMILY HISTORY:  Patient's mother died at age 106 years of heart disease  complications. She was also hypertensive.  Patient's father died of  pneumonia at age 57 years.  He had a history of hypertension and heart  disease.   SOCIAL HISTORY:  Patient is married.  She lives with her spouse.  She is  independent of activities of daily living.  She used to work at a [employer] but is retired.  She has a 32-pack-year smoking history  and is a current smoker.  There is no history of alcohol or drug abuse.   REVIEW OF SYSTEMS:  Fourteen systems reviewed and apart from history of  presenting illness, is  noncontributory.  There is no history of  delusions, hallucinations, suicidal or homicidal ideations.  There is no  history of asymmetrical limb weakness, headache, dysarthria, or twisting  of the mouth. No vomiting.   PHYSICAL EXAMINATION:  Ms. Zea is a moderately built and obese female  patient in no obvious distress.  She is quite comfortable.  VITAL SIGNS:  Temperature 97.9 degrees Fahrenheit, pulse 66 per minute,  regular.  Respirations 19 per minute.  Blood pressure 116/52 mmHg.  Oxygen saturation of 99% to 100% on 2 liters via nasal cannula.  HEENT:  Normocephalic and atraumatic.  Pupils are equal and reactive to  light and accommodation.  Bilateral immature cataracts.  Oral cavity  with no oropharyngeal erythema or thrush.  NECK:  Without JVD, carotid bruit, lymphadenopathy, or goiter.  Supple.  LYMPHATICS:  No lymphadenopathy.  RESPIRATORY:  Slightly decreased breath sounds in the bases with  occasional few crackles.  No wheezing or rhonchi.  The rest of the lung  fields are clear to auscultation.  CARDIOVASCULAR:  First and second heart sounds heard and soft.  No third  or fourth heart sounds or murmurs.  ABDOMEN:  Nondistended, nontender.  No organomegaly or mass appreciated.  Bowel sounds normally heard.  CENTRAL NERVOUS SYSTEM:  Patient is awake, alert and oriented x3.  No  cranial nerves or focal neurological deficits.  EXTREMITIES:  Right shoulder in a splint postop.  Bilateral legs trace  pitting pedal edema.  Peripheral pulses are symmetrically felt.  MUSCULOSKELETAL:  Unremarkable.   LAB DATA:  Hemoglobin 15.6.  Basic metabolic panel on November 14th  remarkable for a potassium of 5.2, BUN 33, creatinine 1.33, and glucose  of 155.   Chest x-ray has been reported as an increased right basilar infiltrate  versus atelectasis.  Mild atelectasis in the left lung base.   ASSESSMENT/PLAN:  1. Postoperative hypoxia.  Differential diagnoses include      hypoventilation  secondary to sedation/anesthesia, atelectasis,      aspiration pneumonitis/pneumonia and COPD.  Will follow white blood      cell count and will check BNP.  Will place the patient empirically      on IV Unasyn and encourage incentive spirometry use.  To titrate      oxygen, to maintain oxygen saturations above 92%. If patient      remains asymptomatic with no fever or leucocytosis, consider      discontinuing and monitoring  off antibiotcs.  2. ? Preoperative seizure:  No prior history of seizures. There is no      clear documentation of the said seizures in the chart at this time.      To obtain further details in the morning from the OR staff. Will      request an EEG, but do not expect it to be helpful. No medications      at this time but to monitor for seizures. Consider further      evaluation based on history obtained.  3. History of congestive heart failure:  Not clinically in overt heart      failure at this time.  Will check BNP and continue patient on home      Lasix.  4. Diabetes:  To continue home medications and sliding scale insulin.  5. Chronic obstructive pulmonary disease:  Stable.  6. Tobacco abuse:  For counseling.      Marcellus Scott, MD  Electronically Signed     AH/MEDQ  D:  01/09/2007  T:  01/09/2007  Job:  [id number]   cc:   Frederica Kuster, MD  Rollene Rotunda, MD, Karleen Hampshire, M.D.

## 2010-07-06 NOTE — H&P (Signed)
[name], OHAGAN NO.:  [id number]   MEDICAL RECORD NO.:  [id number]          PATIENT TYPE:  EMS   LOCATION:  MAJO                         FACILITY:  [facility]   PHYSICIAN:  Monte Fantasia, MD  DATE OF BIRTH:  Apr 25, 1950   DATE OF ADMISSION:  06/27/2008  DATE OF DISCHARGE:                              HISTORY & PHYSICAL   PRIMARY CARE PHYSICIAN:  Dr. Hyacinth Meeker from [facility].   GASTROENTEROLOGIST:  From [facility].   CHIEF COMPLAINT:  The patient is complaining of generalized weakness and  fatigue.   HISTORY OF PRESENT ILLNESS:  A 60 year old Caucasian lady patient has  come in with chief complaint of fatigue, states that she has been  feeling generalized weakness and increasing fatigue since past 3-4 days.  As per the patient, the weakness has been gradual and now increasing  that even she feels tired going to her bathroom.  It is aggravated on  walking.  No relieving factors.  The patient denies any complaints of  chest pains.  No shortness of breath.  Denies any dizziness.  Denies any  diaphoresis.  No history of fever, chills, cough, nausea, or vomiting.  No history of similar complaints in the past.  As per the patient states  that her hemoglobin had been low and for that she takes iron.  The  patient states that her stools are black, but she is on iron therapy and  also denies any complaints of any bright red blood in her stools.  Denies any complaints of abdominal pain, constipation, or diarrhea.  As  per the patient, had been to a gastroenterologist 2 years back with  [facility] and has undergone a colonoscopy for the same and was told  that everything was normal.   Review of 12-point system evaluation has been negative other than those  mentioned in the HPI.   PAST MEDICAL HISTORY:  CHF, COPD, hypertension.   ALLERGIES:  Anemia, coronary artery disease, depression, diabetes type  2, and hyperlipidemia.   ALLERGIES:  No known  drug allergies.   SURGICAL HISTORY:  Undergone C-section 3 times, carotid endarterectomy,  and cholecystectomy.   SOCIAL HISTORY:  The patient is a smoker, smoked half pack of cigarettes  a day for past 30-35 years.  No history of IV drug use and no history of  alcohol.  No history of recent travel.   MEDICATIONS AT HOME:  1. Altace 10 mg p.o. daily.  2. Zoloft 100 mg p.o. daily.  3. Glucophage 500 mg q.a.m.  4. Aspirin 325 mg p.o. daily.  5. Lopid 600 mg p.o. b.i.d.  6. Lasix 80 mg p.o. daily.  7. Pravachol 40 mg p.o. daily.  8. Potassium chloride 10 mEq p.o. daily.  9. Procrit 10,000 units once a month.  10.Toprol-XL 25 mg p.o. daily.  11.Ferrous sulfate 325 mg p.o. b.i.d.   FAMILY HISTORY:  Noncontributory secondary to the patient's age.   PHYSICAL EXAMINATION:  VITAL SIGNS:  Temperature of 98.1, blood pressure  of 140/49, heart rate of 74, respirations of 18, oxygen saturation 97%  on  room air.  GENERAL:  The patient is comfortable, not in apparent respiratory  distress.  HEENT:  Neck is supple.  Pupils equal, reacting to light.  No JVD.  Surgical scar on her neck on the left side.  No icterus.  RESPIRATORY:  Air entry is bilaterally equal.  No rales.  No rhonchi.  CARDIOVASCULAR:  S1 and S2.  Regular rate and rhythm.  No murmurs heard.  ABDOMEN:  Soft.  No guarding.  No rigidity.  No tenderness.  No  distention.  No masses felt.  EXTREMITIES:  No edema of feet.  CNS:  The patient is alert, awake, oriented x3.  No focal neurological  deficits.  SKIN:  Intact.  No evidence of any skin breakdown or rashes.   RADIOLOGICAL INVESTIGATIONS DONE ON ADMISSION:  None.   LABORATORY DATA DONE ON ADMISSION:  Total WBC 8.1, hemoglobin 7.8,  hematocrit 23.4, platelets of 421.  Sodium 139, potassium 3.9, chloride  103, bicarb 26, glucose 100, BUN 23, creatinine 0.82, albumin 3.4,  calcium 9.7.  Cardiac enzymes x1 set has been negative.  Fecal occult  blood is positive.    ASSESSMENT AND PLAN:  1. Symptomatic anemia.  2. Anemia secondary to blood loss.  3. Hemoccult blood positive in stools.  4. Hypertension.  5. Coronary artery disease.  6. Hyperlipidemia.  7. Depression.   PLAN:  Admit the patient to [facility].  We would have to IV large  IV bore accesses.  We would monitor her H and H q.8 h.  The patient at  present is receiving transfusion of 1 unit PRBC.  We would repeat her H  and H again to keep H and H of more than 10 gm/dL.  The patient would  also need a GI evaluation in a.m. with the [facility].  The patient last has followed 2 years back with the [facility].  We  would keep the patient n.p.o. for now.  We would give her D5 half-normal  saline at 80 mL per hour for next 24 hours.  We would recheck her CBC  and her basic labs in the a.m.   In view of hypertension, the patient had no hypotensive episodes on  admission.  We would continue her on the antihypertensive medications  for now, on Altace, Toprol-XL, and we would continue her statins,  Pravachol.  We would hold her aspirin for now in view of the Hemoccult  blood being positive and the patient being anemic.  In view of the  congestive heart failure, the patient at present is compensated.  We  would continue with her Lasix 80 mg and her potassium supplementation  for the same.  In view of depression, the patient's mood is stable at  present.  We would continue with her Zoloft for the same.  In view of  diabetes, the patient would be n.p.o.  We would monitor her  Chemstrips, Accu-Cheks q.4 h. without coverage and if blood sugars more  than 150, we would consider starting her back on insulin coverage.  DVT  and GI prophylaxis, on SCDs and on Protonix IV 40 mg q.12 h.   TOTAL TIME FOR ADMISSION:  50 minutes.      Monte Fantasia, MD  Electronically Signed     MP/MEDQ  D:  06/27/2008  T:  06/28/2008  Job:  [id number]

## 2010-07-09 NOTE — H&P (Signed)
[name], [name] NO.:  [id number]   MEDICAL RECORD NO.:  [id number]          PATIENT TYPE:  INP   LOCATION:  1828                         FACILITY:  [facility]   PHYSICIAN:  Hillery Aldo, M.D.   DATE OF BIRTH:  Dec 15, 1950   DATE OF ADMISSION:  08/18/2005  DATE OF DISCHARGE:                                HISTORY & PHYSICAL   PRIMARY CARE PHYSICIAN:  [facility]   CHIEF COMPLAINT:  Weakness, nausea and vomiting.   HISTORY OF PRESENT ILLNESS:  The patient is a 60 year old female with past  medical history of coronary artery disease with recent non-Q-wave MI who  presents with 3-day history of nausea, vomiting and diarrhea accompanied by  generalized weakness.  The patient denies any chest pain, shortness of  breath and cough.  There is been no fever or chills.  The patient states  that over the past 3 days she has had very little p.o. intake with only sips  of fluids.  She denies any hematemesis, melena or hematochezia.  She has  been taking all of her antihypertensives as prescribed.  Upon initial  evaluation in emergency department, she is found to be hypotensive,  hyperkalemic and acute renal failure.  She is admitted for further  evaluation and workup.   PAST MEDICAL HISTORY:  1.  Recent non-Q-wave MI.  2.  Coronary artery disease status post cardiac catheterization on      08/01/2005.  3.  Mitral valvular regurgitation.  4.  History of left carotid stenosis status post left carotid      endarterectomy.  5.  Hypertension.  6.  Hyperlipidemia.  7.  Obesity.  8.  Status post cholecystectomy.  9.  Status post appendectomy.  10. Status post C-section.  11. Diabetes mellitus.  12. COPD.  13. Peripheral vascular disease.  14. History of CHF secondary to diastolic dysfunction.  15. Chronic anemia thought to be multifactorial with anemia of chronic      disease and AVM's on capsule endoscopy in the main contributors.   FAMILY HISTORY:  The  patient's mother died of CHF and coronary disease at  57.  Father died of pneumonia and coronary disease in his 96s.  She has two  brothers who died in their 43s from acute myocardial infarction.   SOCIAL HISTORY:  The patient is married and lives in [place] with her  husband.  She is a 30 pack year tobacco history.  No alcohol.  She is a  retired Location manager.   DRUG ALLERGIES:  None.   CURRENT MEDICATIONS:  1.  Pravachol 80 mg daily.  2.  Toprol 50 mg b.i.d.  3.  Allegra 180 mg daily.  4.  Zyprexa 2.5 mg daily.  5.  Amaryl 2 mg b.i.d.  6.  Lasix 80 mg daily.  7.  Tavitin 600 mg b.i.d.  8.  Metformin 2 grams q.a.m., 1 gram q.p.m.  9.  Lopid 600 mg b.i.d.  10. Avapro 150 mg daily.  11. Altace 20 mg b.i.d.   REVIEW OF SYSTEMS:  The patient denies any changes in  her weight.  Otherwise  as per HPI.   PHYSICAL EXAM:  VITAL SIGNS:  Temperature 98.3, pulse 52, respirations 18,  blood pressure 95/43, O2 saturation 97% on room air.  GENERAL:  Obese female in no acute distress.  HEENT: Normocephalic, atraumatic.  PERRL.  EOMI.  Oropharynx reveals dry  mucous membranes.  NECK:  Supple, no thyromegaly, no lymphadenopathy, no jugular venous  distension.  CHEST:  Lungs clear to auscultation bilaterally with good air movement.  HEART:  Regular rate, rhythm.  No murmurs, rubs, gallops.  ABDOMEN:  Soft, tender diffusely.  Decreased bowel sounds.  EXTREMITIES:  There is trace edema.  SKIN:  Warm and dry.  Pale.  NEUROLOGIC:  The patient is alert and oriented x3.  Cranial nerves II-XII  grossly intact.  Nonfocal.   CHEST X-RAY:  Shows cardiomegaly with no failure.  There is low inspiratory  lung values.   LABORATORY DATA:  CBC shows a white blood cell count 10.2, hemoglobin 7,  hematocrit 21.7, platelet count 386 with an MCV of 88.1.  Sodium is 126,  potassium 5.7, chloride 95, bicarb 25, BUN 74, creatinine 2.9, glucose 111.  Point of care markers reveal a myoglobin of 206, MB  less than 1, troponin I  of less than 0.05.   ASSESSMENT/PLAN:  1.  Acute renal failure in the setting of nausea, vomiting, diarrhea and      ongoing diuretic and ACE inhibitor as well as ARB use:  We will      discontinue all the patient's antihypertensives for now, admit her for      IV fluid rehydration.  We will monitor her renal function closely and      initiate further diagnostic workup if she does not respond to IV fluids      and holding of her antihypertensives.  2.  Anemia:  The patient has a longstanding history of anemia that is      multifactorial.  Her hemoglobin is only 7 and in the setting of coronary      disease, this is quite low.  Additionally, when we hydrate her I suspect      this will drop further.  We will type and cross 2 units packed red blood      cells and follow her hemoglobin.  And transfuse her as needed.  3.  Hyponatremia:  The patient is hyponatremic.  We will give her IV fluids      with normal saline and monitor her sodium.  This likely secondary to      ongoing diuretic use in the setting of dehydration.  4.  Hypotension:  Likely secondary to dehydration and ongoing      antihypertensives.  We will hold all antihypertensives for now.  5.  Hyperkalemia:  This is likely secondary to acute renal failure.  We will      give her calcium chloride to protect her myocardium and give her 30      grams Kayexalate to bring her potassium down.  We will monitor her      electrolytes closely.  6.  Diabetes:  We will monitor the patient's glycemic control and use      sliding scale insulin to keep her well controlled.  7.  Prophylaxis:  Initiate GI and DVT prophylaxis.      Hillery Aldo, M.D.  Electronically Signed     CR/MEDQ  D:  08/18/2005  T:  08/19/2005  Job:  [id number]   cc:   [facility]

## 2010-07-09 NOTE — Op Note (Signed)
[name], MANNER NO.:  [id number]   MEDICAL RECORD NO.:  [id number]          PATIENT TYPE:  OIB   LOCATION:  2899                         FACILITY:  [facility]   PHYSICIAN:  Di Kindle. Edilia Bo, M.D.DATE OF BIRTH:  [birth year]   DATE OF PROCEDURE:  08/23/2004  DATE OF DISCHARGE:                                 OPERATIVE REPORT   PREOPERATIVE DIAGNOSIS:  Bilateral carotid disease.   POSTOPERATIVE DIAGNOSIS:  Bilateral carotid disease.   PROCEDURE:  1.  Arch aortogram.  2.  Selective right common carotid arteriogram.  3.  Selective left common carotid arteriogram.   SURGEON:  Di Kindle. Edilia Bo, M.D.   ANESTHESIA:  Local.   INDICATIONS:  This is a 60 year old woman who presented with an episode of  right shoulder and right arm weakness which lasted approximately 15 minutes.  CT at that time was negative. She had also had some problems with blurred  vision of the left eye, which she had had also in the past. Carotid duplex  scan showed bilateral significant carotid disease including diffuse plaque  in both common carotid arteries. Therefore cerebral arteriogram was  recommended by Dr. Arbie Cookey for further assessment of her proximal disease. The  procedure and potential complications were discussed with the patient by Dr.  Arbie Cookey. All of her questions were answered. She was agreeable to proceed.   TECHNIQUE:  The patient was brought to the PV lab at [facility] and both groins  were prepped and draped in usual sterile fashion. Because of her obesity, it  was difficult to feel either femoral pulse. Using the Doppler, I did locate  the right common femoral artery but despite multiple attempts including  attempting to use to Smart needle, I was never able to cannulate the femoral  artery on the right. I therefore attempted on the left side.  Again using  the Doppler, I was able to locate the artery and I was able to successfully  cannulate the left common femoral  artery. A guidewire was then introduced  into the infrarenal aorta and then a 5-French sheath placed over the wire  and the dilator removed. A long pigtail catheter was positioned in the  ascending aortic arch and arch aortogram obtained at a 40 degree LAO  projection. This catheter was then exchanged for an H1 catheter which was  positioned into the innominate artery. The guidewire was then advanced into  the right common carotid artery and the catheter advanced over the wire.  Selective right common carotid arteriogram was obtained. Both intracranial  and extracranial views were obtained. The intracranial views will be  dictated separately by the neuroradiologist. I then brought the H1 catheter  back but could not get it into the left common carotid artery which  originated off the proximal innominate artery. I therefore used a Sim I  catheter was able to cannulate the left common carotid artery with the Sim I  catheter. Selective left common carotid arteriogram was obtained.   FINDINGS:  There is no significant disease of the aortic arch. The  innominate artery and right subclavian artery  are widely patent. There is  some mild disease of the proximal right subclavian artery. The right common  carotid artery has an approximately 50% smooth stenosis proximally. There is  a 40 to 50% stenosis at the bifurcation of the right carotid artery. The  internal carotid artery above this looks normal. Next, on the left side  there is a 40% proximal left common carotid artery stenosis. At the proximal  left internal carotid artery there is an 80 to 90% stenosis which is fairly  focal. The internal carotid artery above this looks normal. Again,  intracranial views will be dictated separately by the neuroradiologist.  Of  note, the patient had a dominant left vertebral artery. The right vertebral  artery could not be identified.   CONCLUSIONS:  1.  40-50% right carotid bifurcation stenosis.  2.   50% right proximal common carotid artery stenosis.  3.  80-90% proximal left ICA stenosis.  4.  40% proximal left common carotid artery stenosis.       CSD/MEDQ  D:  08/23/2004  T:  08/23/2004  Job:  [id number]

## 2010-07-09 NOTE — Consult Note (Signed)
[name], [name] NO.:  [id number]   MEDICAL RECORD NO.:  [id number]          PATIENT TYPE:  OIB   LOCATION:  2899                         FACILITY:  [facility]   PHYSICIAN:  Janeece Riggers. Karin Golden, M.D.   DATE OF BIRTH:  Mar 01, 1950   DATE OF CONSULTATION:  08/23/2004  DATE OF DISCHARGE:                                   CONSULTATION   CONSULT:  Intracranial injections of cerebral angiogram on August 23, 2004 on  [name] performed by Dr. Waverly Ferrari.   Intracranial imaging of the right common carotid artery injection  demonstrates antegrade filling of a posterior communicating artery.  There  is some mild narrowing of the right M1 segment, there are also scattered  focal areas of narrowing of multiple branch vessels throughout the ACA and  MCA distributions best seen on the lateral view, most consistent with  intracranial atherosclerosis.  There is cross filling of the anterior  cerebral artery to fill the left A2 segment.  There were no other focal  stenosis, aneurysm, or branch vessel occlusion seen.  A second set of AP and  lateral views from the right common carotid position are submitted towards  the end of the case.  These demonstrate no significant change.   The AP and lateral views of the left common carotid artery are also  submitted.  No significant aneurysm, stenosis or branch vessel occlusion is  seen.  There is again noted to be some irregularity of the pericallosal and  callosal marginal arteries as well as multiple M2 and M3 branches consistent  with intracranial atherosclerosis.  The posterior communicating artery fills  with significant wash-in from what is presumed to be the left P2 segment.  Parenchymal and venous phases are normal bilaterally.   IMPRESSION:  1.  Bilateral intracranial irregularity most consistent with      atherosclerosis.  2.  Prominent posterior communicating artery on the right and fetal type      PCA.  3.  Prominent  posterior communicating artery on the left with significant      flow from the vertebrobasilar system.           ______________________________  Janeece Riggers Karin Golden, M.D.     MES/MEDQ  D:  08/23/2004  T:  08/23/2004  Job:  [id number]

## 2010-07-09 NOTE — H&P (Signed)
NAME:  [name], [name] NO.:  [id number]   MEDICAL RECORD NO.:  [id number]          PATIENT TYPE:  INP   LOCATION:  1844                         FACILITY:  [facility]   PHYSICIAN:  Nelma Rothman, MD   DATE OF BIRTH:  03/22/1950   DATE OF ADMISSION:  07/28/2005  DATE OF DISCHARGE:                                HISTORY & PHYSICAL   PRIMARY CARE PHYSICIAN:  [facility], the patient sees [name], nurse practitioner.   PRIMARY GASTROINTESTINAL PHYSICIAN:  Dr. Marina Goodell of [facility] gastroenterology.   CHIEF COMPLAINT:  Generalized weakness and dizziness.   HISTORY OF PRESENT ILLNESS:  The patient is a 60 year old female with a  longstanding history of iron deficiency anemia who presented to the  emergency department this morning with dizziness when standing, feeling like  she was going to pass out.  She denies any chest pain, shortness of breath  or edema.  She had otherwise been doing well except for an increase in nasal  congestion for which she presented to her primary care Kolbee Bogusz's office  earlier this week and was given a prescription for 10 days of amoxicillin  for sinusitis.  She has not noticed any bright red blood per rectum, melena,  or hematochezia.  She denies any nausea, vomiting and diarrhea.  She also  denies any fevers or chills.  Her only other complaint is that of a floater  in her left side.  She has not had any visual loss or blurry vision.   PAST MEDICAL HISTORY:  1.  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  2.  Diabetes mellitus type 2, non-insulin dependent.  3.  Hypertension.  4.  Hyperlipidemia.  5.  Obesity.  6.  Left ICA stenosis status post carotid endarterectomy July 2006.   SOCIAL  HISTORY:  She lives in [place], [place], with her  husband, son and daughter.  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  She smokes about  one pack per day of cigarettes for the past 30 years.   FAMILY HISTORY:  Both her mother and father had coronary artery disease.  Her mother died of congestive heart failure at age 4 and her father died  with pneumonia, also at age 4.   REVIEW OF SYSTEMS:  She has noted some numbness and tingling in bilateral  hands and feet.  The tingling in her hands has been going on for a matter of  years while the tingling in her feet is slightly new.  She also had a mild  headache this morning which has since resolved.  She has noted a floater in  her left eye since Monday.  She has not noted any other visual symptoms  including visual loss or blurry vision.  Otherwise,  10-point review of  systems is negative.   PHYSICAL EXAMINATION:  VITAL SIGNS:  Temperature 97.0, blood pressure  112/55, pulse is 86, respiration rate 18, with oxygen saturation of 99% on  room air.  GENERAL:  She is in no acute distress.  HEENT:  Funduscopic exam is somewhat difficult as the patient is unable to  keep her eye open so I am unable to make any meaningful conclusions.  Her  mucous membranes are moist.  NECK:  Supple.  There is no jugular venous distension.  HEART:  Regular rate and rhythm with a soft 1/6 systolic murmur at the upper  sternal border.  LUNGS:  Clear to auscultation bilaterally.  ABDOMEN:  Soft, nontender, nondistended with normoactive bowel sounds and no  hepatosplenomegaly.  EXTREMITIES:  There is no edema.  NEUROLOGIC:  Grossly nonfocal but not formally tested.   LABORATORY DATA:  Hemoglobin 7.5, hematocrit 22.0.  Sodium 133, potassium  4.4, chloride 98, BUN 47, creatinine 1.1, and glucose 185.  Point-of-care  myoglobin is 78.1 and CK-MB is 1.5 with a point-of-care troponin of  0.11.  A  urinalysis is negative.  EKG demonstrates sinus rhythm with a rate of 82.  There is an occasional PVC.  There is very slight ST depression in the  inferior lateral leads, specifically leads II, aVF, V5, and V6.  This is  essentially no change since July 2006.   CURRENT MEDICATIONS:  1.  Zyprexa 2.5 mg p.o. q.p.m. at 5 p.m.  2.  Zoloft 100 mg p.o. daily.  3.  Metformin 2000 mg p.o. q.a.m. and 1000 mg p.o. q.p.m.  4.  Altace 20 mg p.o. b.i.d.  5.  Lopid 600 mg p.o. b.i.d.  6.  Actos 45 mg p.o. daily.  7.  Amoxicillin 500 mg p.o. t.i.d. since June 4, anticipated 10-day course.  8.  Amaryl 2 mg p.o. b.i.d.  9.  Lasix 80 mg p.o. daily.  10. Allegra 180 mg p.o. daily.  11. Pravachol 80 mg p.o. daily.  12. Teveten 600 mg p.o. b.i.d.  13. Iron tablets t.i.d.  14. Ambien CR 12.5 mg p.o. q.h.s. p.r.n. insomnia.   ASSESSMENT AND PLAN:  1.  Orthostasis and dizziness.  This is almost certainly secondary to anemia      so we will go ahead and transfuse 2 units of packed red blood cells and      recheck a CBC post transfusion.  The elevated blood urea nitrogen also      suggests hypovolemia as well but we will hold on administering IV fluids      until after transfusion given her history of congestive heart failure by      report, although 2-D echocardiogram from 2003 is essentially normal.      However, I will hold her Lasix.  2.  Anemia.  Will continue iron supplementation.  She has undergone an      extensive workup by Dr. Marina Goodell as detailed earlier.  The patient states      that her insurance approval of erythropoietin is currently pending.  I      would suggest possibly monthly or every-other-monthly CBC check as an      outpatient upon discharge.  3.  Diabetes mellitus.  Will continue with home medications and check a      hemoglobin A1c.  While she is here in the hospital we will also continue     with capillary blood glucose checks with sliding scale insulin coverage  before meals and at bedtime.  4.  Hypertension.  Will continue her home medications and adjust as needed.      As mentioned above, we will hold her Lasix for the time being given her      elevated BUN-to-creatinine ratio.  5.  Left eye floater.  Mrs. Carton will need a dilated retinal exam.      Differential diagnosis includes idiopathic versus vitreal hemorrhage      versus retinal detachment versus others.  If she is stable for discharge      in the morning, we will try to arrange for her to be seen in the      ophthalmology clinic.  6.  Elevated troponin.  I doubt that this reflects acute coronary syndrome.      It is more likely subendocardial ischemia in the setting of increased      demand secondary to anemia and dehydration.  Will follow her enzymes out      and monitor on telemetry, but at this point I do not feel she needs      acute cardiac evaluation.  Mrs. Deviney would be an ideal candidate for      cardiac prevention with a daily aspirin, given her other risk factors;      however, with iron deficiency anemia and evidence of ongoing blood loss,      will need to hold this for the time being unless something changes      acutely.      Nelma Rothman, MD     RAR/MEDQ  D:  07/28/2005  T:  07/28/2005  Job:  [id number]

## 2010-07-09 NOTE — Progress Notes (Signed)
[facility]                          PERIPHERAL VASCULAR OFFICE NOTE   NAME:[name], [name]                        MRN:          [id number]  DATE:12/28/2005                            DOB:          Mar 22, 1950    PRIMARY CARE PHYSICIAN:  [name].   HISTORY OF PRESENT ILLNESS:  [name] is a 60 year old lady with  atherosclerotic coronary disease as well as peripheral arterial disease.  On  October 19th of this year, I stented the left external iliac artery.  She  was then rehospitalized the next day with some bright red blood per rectum.  She saw Dr. Marina Goodell, who felt the blood loss was from the small bowel, and no  further evaluation was indicated.   She has not seen any improvement in her left leg discomfort since the  procedure.  She has had some numbness and tingling in her left foot.  There  has been no cyanosis or rash on her left leg.   Prior to the procedure, the patient had complained to me of similar symptoms  in her right leg; however, at the time of her procedure, she said her right  leg was asymptomatic.  Her family disagreed.  She now says that her right  leg was symptomatic prior to the procedure and continues to be so today.   Duplex ultrasound performed in the office today demonstrates ABI on the  right of 0.51, down from 0.75 and on the left, 0.35, down from 0.45.  There  is a severe stenosis in the left external iliac artery with a peak systolic  velocity of 491 cm per second.  It is not clear whether this is within the  stented segment or distal to it, as imaging is suboptimal due to her  habitus.   PAST MEDICAL HISTORY:  1. Coronary artery disease with chronic occlusion of the right coronary      artery.  2. Diabetes mellitus diagnosed in 1993.  3. Hypertension.  4. Chronic anemia, treated with erythropoietin and iron.  5. Obesity.  6. Moderate mitral regurgitation.  7. History of congestive heart failure with  preserved systolic function.  8. Status post left carotid endarterectomy by Dr. Madilyn Fireman.  9. Three prior C-sections.  10.Status post cholecystectomy.  11.Status post tonsillectomy.   ALLERGIES:  None known.   CURRENT MEDICATIONS:  1. Lasix 80 mg per day.  2. KCL 10 mEq per day.  3. Toprol XL 25 mg per day.  4. Teveten 300 mg per day.  5. Iron 325 mg twice per day.  6. Pravachol 80 mg nightly.  7. Lopid 600 mg twice per day.  8. Zyprexa 2.5 mg per day.  9. Zoloft 100 mg per day.  10.Actos 15 mg per day.  11.Metformin 500 mg once per day.  12.Altace 10 mg per day.  13.Aspirin 325 mg per day.   SOCIAL HISTORY:  Patient is married.  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  Smokes at least a half pack per day of cigarettes.  Denies  alcohol and illicit drug use.  FAMILY HISTORY:  Father died at 45 of pneumonia.  Mother died at 45 of  myocardial infarction.  Brother died of bladder cancer at 74.  A brother and  a sister are alive in their early 67s with hypertension and diabetes.   REVIEW OF SYSTEMS:  Occasional dizziness.  Wears glasses.  Chronic  exertional dyspnea.  No recurrent bright red blood per rectum since  hospitalization.  Review of systems is negative in detail except as per HPI.   PHYSICAL EXAMINATION:  VITAL SIGNS:  Heart rate 76, blood pressure 110/68.  Weight 178.  GENERAL:  She is generally well-appearing in no distress.  NECK:  She has no jugular venous distention.  No thyromegaly.  LUNGS:  Clear to auscultation.  HEART:  She has a nondisplaced point of cardiac maximal impulse.  There is a  regular rate and rhythm without murmur, rub or gallop.  ABDOMEN:  Soft, nontender, nondistended.  There is no hepatosplenomegaly and  no midline pulsatile mass.  Bowel sounds are normal.  EXTREMITIES:  Warm without cyanosis, clubbing, edema, or ulceration.  VASCULAR:  Carotid pulses are 2+ bilaterally without bruits.  Radial pulses  are 2+ bilaterally.  Right femoral  pulse 1+ with DP, PT, and popliteal not  palpable.  On the left, the femoral is 1+ with DP, popliteal, and PT not  palpable.  Prominent left femoral bruit.  HEENT:  Normal.  MUSCULOSKELETAL:  Normal.  NEUROLOGIC:  She is alert and oriented x3 with normal affect.  Cranial  nerves II-XII are grossly intact.  Strength and sensation are normal in all  four extremities.   IMPRESSION/RECOMMENDATIONS:  1. Left leg claudication:  Appears to have new stenosis on the left.  Not      clear whether this is a dissection or thrombus within the stent.  She      was unable to take her Plavix due to the gastrointestinal bleed;      however, she did continue aspirin.  We will plan on repeat angiography      in two days' time.  2. Tobacco abuse:  Complete cessation strongly advised.  3. Diabetes mellitus:  Hold Metformin prior to procedure.  4. Congestive heart failure with preserved left ventricular systolic      function.  5. Obesity.  6. Coronary disease.  7. Right leg claudication:  Patient now says that this is symptomatic.      Has long segment, at least external iliac artery disease on that side.      Will plan on treating this as well.     Salvadore Farber, MD  Electronically Signed    WED/MedQ  DD: 12/28/2005  DT: 12/28/2005  Job #: [id number]

## 2010-07-09 NOTE — Discharge Summary (Signed)
NAMEDORSIE, SETHI NO.:  [id number]   MEDICAL RECORD NO.:  [id number]          PATIENT TYPE:  INP   LOCATION:  2109                         FACILITY:  [facility]   PHYSICIAN:  Jerold Coombe, P.A.DATE OF BIRTH:  12-09-1950   DATE OF ADMISSION:  09/07/2004  DATE OF DISCHARGE:  09/11/2004                                 DISCHARGE SUMMARY   ANTICIPATED DATE OF DISCHARGE:  September 12, 2004.   ADMISSION DIAGNOSIS:  Severe, symptomatic, left internal carotid artery  stenosis.   DISCHARGE/SECONDARY DIAGNOSES:  1.  Severe, symptomatic, left internal carotid artery stenosis, status post      left carotid endarterectomy.  2.  Postoperative, acute, blood loss anemia, status post transfusion.  Also      with history of chronic anemia on iron supplementation.  3.  Postoperative hypotension requiring several days of intravenous      dopamine.  4.  History of congestive heart failure.  5.  Cholecystectomy.  6.  History of stent in her kidney, according to patient, assuming renal      artery stent.  The patient is unsure of which side.  7.  History of cesarean sections x3.  8.  Diabetes mellitus, type 2.  9.  No known drug allergies.   PROCEDURES:  September 07, 2004, left carotid endarterectomy with Dacron patch  angioplasty by Dr. Denman George.   BRIEF HISTORY:  Ms. Berwanger is a 60 year old Caucasian female, who had an  episode of right shoulder and arm weakness lasting approximately 15 minutes  approximately four weeks ago.  She also experienced left eye blurred vision  at the same setting.  The patient was evaluated at [facility] where  she had a CT scan, which was negative for intracranial pathology.  She was  seen by Dr. Madilyn Fireman about 10 years with visual changes and moderate stenosis  by carotid duplex.  She underwent formal arteriography but at that time  revealed only 65% stenosis of her right internal carotid and 50% stenosis of  her left internal carotid  artery.  On August 10, 2004, she underwent bilateral  carotid duplex study, which revealed a 60 to 90% right internal carotid  artery stenosis and an 80 to 99% left internal carotid artery stenosis.  Peak systolic velocities in the left were 500, and end diastolic velocities  were 191 cm/sec.  The patient was scheduled for an arch aortogram with  selective right and left common carotid arteriograms.  This was done on August 23, 2004, and revealed 40 to 50% right carotid bifurcation and stenosis, with  50% right proximal common carotid artery stenosis.  There was an 80 to 90%  proximal left internal carotid artery stenosis, and 40% proximal left common  carotid artery stenosis.  Due to her history and the finding of severe left  internal carotid artery stenosis, Dr. Madilyn Fireman recommended that she undergo a  left carotid endarterectomy.  After discussing risks, benefits, and  alternatives, she agreed to proceed.   HOSPITAL COURSE:  On September 07, 2004, Ms. Kister was electively admitted to  [facility] and did undergo a left carotid endarterectomy.  Postoperatively, Ms. Krenzer was extubated neurologically intact, however, due  to hypotension she required a dopamine drip.  On dopamine, her home regimen  of antihypertensives were placed on hold.  On postoperative day one, she  remained stable but still requiring a dopamine drip for hypotension.  Postoperative labs also showed a decreased hemoglobin and hematocrit of 7.1  and 22.2.  She was treated with one unit of packed red blood cells.  Postoperative EKG showed a normal sinus rhythm with no acute ST changes.  Ms. Lorenzen remained on Unit 2100 in the medical intensive care unit over the  next several days.  Her dopamine drip was weaned on two occasions and  initially her blood pressure remained stable but with then gradually  decreased to a systolic in the 70s requiring re-initiation of her dopamine  drip.  Otherwise, she remained stable and  afebrile.  Her heart rate remained  in the 70s to 90s and in sinus rhythm.  Initially, she did require nasal  supplemental oxygen at one liter per nasal cannula with SATs up to 99%.  It  is anticipated that she will be weaned without difficulty.  Otherwise,  arrangements will be made for home oxygen if felt appropriate.  Postoperatively, she was able to tolerate an oral diet and voiding without  difficulty after removal of the Foley catheter.  Her diabetes was managed on  her home regimen of Amaryl and Actos.  Her Metformin was held until her p.o.  intake increased.  In the meantime, she was also covered on a NovoLog  insulin sliding scale at 4 ml and at bedtime.  Blood sugars remained stable;  however, her nighttime sliding scale insulin was placed on hold due to a non-  eventful hypoglycemic event.  In regards to her anemia, she did require a  second transfusion for a hemoglobin of 7.9.  At the time of this dictation,  her hemoglobin and hematocrit are stable at 8.4 and 25.8 respectively, which  is just at her baseline.  She remains neurologically intact.  Her incision  also is healing well without signs of infection.  At this point, her primary  issue is hypotension when off dopamine.  However, blood pressures do appear  to be increasing over the last day or so, and it is anticipated that once  she is weaned successfully off her dopamine drip that she will be ready for  discharge home, hopefully by postoperative day five, September 12, 2004.   LABORATORY DATA:  At this time of this dictation, recent labs show a sodium  of 138, potassium 4.9, chloride 106, CO2 26, BUN 24, creatinine 1.0, calcium  8.6, blood glucose of 57, which was treated with last CBG at 167.  White  blood count of 10.8, hemoglobin 8.4, hematocrit 25.8, platelet count 487.  Hemoccult slides were negative.  Urinalysis was also negative for protein,  blood, leukocytes, nitrates, and glucose.  Preoperative liver function  tests showed a total bilirubin of 0.4, alkaline phosphatase 113, SGOT 22, SGPT 9.  Total protein was 5.8 and blood albumin 3.2, calcium 9.3.   DISCHARGE MEDICATIONS:  It is anticipated that she will go home on her home  medications.  If adjustments are needed, an addendum may be dictated.  Home  medications are:  1.  Fish oil capsules 1000 mg p.o. daily.  2.  Furosemide 80 mg p.o. daily.  3.  Zyprexa 2.5 mg p.o. daily.  4.  Toprol-XL 100 mg p.o. daily.  5.  Metformin 1000 mg p.o. b.i.d.  6.  Teveten 600 mg p.o. b.i.d.  7.  Gemfibrozil 600 mg p.o. b.i.d.  8.  Zoloft 100 mg p.o. daily.  9.  Altace 10 mg p.o. b.i.d.  10. Amaryl 2 mg p.o. daily.  11. Actos 15 mg p.o. daily.  12. Cardizem-LA 240 mg p.o. daily.  13. Pravachol 80 mg p.o. daily.  14. In addition, she will be sent home with a prescription for Tylox 1 to 2      tablets p.o. q.4h p.r.n. for pain.   DISCHARGE INSTRUCTIONS:  1.  She is to continue a diabetic-appropriate diet.  2.  She is to avoid driving and heavy lifting for two weeks.  3.  She should increase her activity slowly, and daily walking exercises      were encouraged.  4.  She may shower and clean her incision gently with mild soap and water.  5.  She is to notify the [facility] office if she develops redness or drainage      from her incision site or for fever greater than 101.  6.  She should avoid lotions directly on her incision.   FOLLOWUP:  She is to follow up with Dr. Madilyn Fireman at the CVTS office on September 27, 2004, at 9 a.m.  She should call sooner if needed.  She should schedule a  visit with her primary care Gid Schoffstall within the next one to two weeks to  reevaluate her blood pressure or sooner if she becomes symptomatic.       AWZ/MEDQ  D:  09/10/2004  T:  09/11/2004  Job:  [id number]   cc:   [facility]  c/o [employer], PA-C.   Maxwell Caul, M.D.  [address].  [place]  [place] [address]  Fax: [phone]

## 2010-07-09 NOTE — Discharge Summary (Signed)
[name], [name] NO.:  [id number]   MEDICAL RECORD NO.:  [id number]          PATIENT TYPE:  INP   LOCATION:  4738                         FACILITY:  [facility]   PHYSICIAN:  [name], M.D.    DATE OF BIRTH:  March 19, 1950   DATE OF ADMISSION:  08/24/2005  DATE OF DISCHARGE:                                 DISCHARGE SUMMARY   DISCHARGE DIAGNOSES:  1.  Acute congestive heart failure, thought to be secondary to intravenous      fluid volume repletion secondary to acute renal failure last week.      1.  A 2-D echocardiogram, on August 25, 2005, revealed a left ventricular          ejection fraction estimated at 65%, no left ventricular regional          wall motion abnormalities, moderate mitral valve regurgitation,          small 4 mm anterior and 7 mm posterior pericardial effusion, and          diastolic dysfunction.  No tamponade physiology.  2.  Small pericardial effusion per echocardiogram.  3.  Type 2 diabetes mellitus.  4.  Hypertension.  5.  Chronic anemia.  6.  Tobacco abuse.  7.  Urinary tract infection.  8.  Mild azotemia.   SECONDARY DISCHARGE DIAGNOSES:  1.  Coronary artery disease, myocardial infarction in June 2007, status post      cardiac catheterization in June 2007.  2.  History of left carotid stenosis, status post left carotid      endarterectomy.  3.  Hyperlipidemia.  4.  Chronic obstructive pulmonary disease.  5.  Peripheral vascular disease.  6.  Obesity.  7.  Diabetic retinopathy.  8.  Status post cholecystectomy in the past.  9.  Status post appendectomy in the past.  10. Status post cesarean section in the past.   DISCHARGE MEDICATIONS:  1.  Lasix 80 mg daily.  2.  Potassium chloride 10 mEq daily.  3.  Toprol XL 25 mg (1/2 of a 50 mg pill) once daily.  4.  Teveten 600 mg 1/2 tablet daily.  5.  Ferrous sulfate 325 mg b.i.d.  6.  Pravachol 80 mg q.h.s.  7.  Lopid 600 mg b.i.d.  8.  Zyprexa 2.5 mg daily.  9.  Zoloft 100 mg  daily.  10. Actos 15 mg daily.  11. Metformin 1000 mg 1/2 tablet daily.  12. Aranesp or Procrit injections as prescribed by your primary care      [name].  13. DO NOT TAKE ALTACE YET, DO NOT TAKE AMARYL YET, DISCONTINUE CEFTIN, AND      DISCONTINUE PHENERGAN.   DISCHARGE DISPOSITION:  The patient is currently stable and in no acute  distress.  The plan is to discharge her home in the next 24-48 hours.  She  was advised to follow up with her primary care [name], [name], in 3-  5 days, her primary gastroenterologist, Dr. Marina [name], in 1-2 weeks, and her  primary cardiologist, Dr. Antoine [name], in 1-2 weeks.   PROCEDURES PERFORMED:  A 2-D echocardiogram on August 25, 2005.   HISTORY OF PRESENT ILLNESS:  The patient is a 60 year old lady with a past  medical history significant for coronary artery disease, peripheral vascular  disease, and diabetes mellitus, who presented to the emergency department on  August 24, 2005, with a chief complaint of shortness of breath.  The patient  was recently rehydrated, last week, for treatment of acute renal failure  secondary to prerenal azotemia in the setting of nausea, vomiting, diarrhea,  and therapy with Avapro, Teveten, Altace, and Lasix.  When the patient was  reevaluated in the emergency department, on August 24, 2005, her chest x-ray  revealed diffuse pulmonary edema and questioned left pleural effusion.  Her  BNP was quite elevated, at 1839.  The patient was, therefore, admitted for  further evaluation and management.   HOSPITAL COURSE:  1.  ACUTE DECOMPENSATED CONGESTIVE HEART FAILURE/DIASTOLIC DYSFUNCTION.  The      patient was started on Lasix 80 mg IV q.12 hours.  Toprol was initially      withheld, as well as the ACE inhibitor and angiotensin receptor blocker.      She was also started on oxygen therapy, titrated to keep her oxygen      saturations greater than 90%.  For further evaluation, cardiac enzymes      were ordered, as well as a 2-D  echocardiogram.  The results of the      cardiac enzymes were completely negative .  The 2-D echocardiogram      revealed a preserved left ventricular systolic function; however, there      was evidence of diastolic dysfunction, and a small pericardial effusion.      It is important to note, there was no echocardiographic evidence for      tamponade physiology.  There was no rub auscultated on exam.  Over the      past few days, the patient has diuresed briskly.  Her urine output has      been at least 1-2 liters in the negative daily.  She is now      symptomatically improved.  Her BNP has fallen to 583, significantly less      than 1800 on admission.  As of today, the Lasix has been changed to 80      mg b.i.d.  However, because of her rising BUN, the Lasix will be changed      to 80 mg p.o. daily tomorrow, August 29, 2005.  A repeat chest x-ray is      pending for in the morning.  If her chest x-ray is significantly      improved and she is virtually asymptomatic, she will be discharged to      home.   1.  HYPERTENSION.  The patient's antihypertensive regimen in the outpatient      setting consisted of Teveten, Altace and Toprol XL.  During the hospital      course, the patient's blood pressures have been within normal limits,      and at times marginally low.  Therefore, the Altace and Teveten continue      to be held.  The Toprol XL was restarted only at 25 mg daily, decreased      from 50 mg b.i.d., her usual home dose.  At the time of this dictation,      the patient's systolic blood pressure is 124.  Certainly, following      hospital discharge, the Toprol and the Teveten  can be titrated      accordingly.  However, given the patient's recent acute renal failure      and rising BUN during this admission, the Altace will be held for now.      The patient can, however, restart Teveten 300 mg daily (half of a 600 mg      pill) following hospital discharge.  1.  TYPE 2 DIABETES MELLITUS.   The patient's glycemic control has been      excellent during the hospital course.  As previously stated, during the      last hospitalization , she required less Amaryl and metformin.  During      the hospitalization, she has been treated with Actos 15 mg daily and a      sliding scale insulin regimen.  Following hospital discharge, she will      be advised to continue Actos at 15 mg daily, and to restart metformin at      1000 mg 1/2 tablet daily.  She was advised to not take the Amaryl until      she is reevaluated by her primary care Aundreya Souffrant in the office.   1.  CHRONIC ANEMIA.  Please see the previous discharge summary.  The      patient's hemoglobin and hematocrit have remained stable during the      hospitalization.  She was started on either Aranesp or Procrit      injections by her primary care Floride Hutmacher two weeks ago.  During this      hospitalization, she was given Aranesp subcutaneous injection once.      Iron therapy was continued with ferrous sulfate 325 mg b.i.d.  She was      scheduled to follow up with Dr. Marina [name], in his office, on August 30, 2005,      however it is unclear whether or not the patient will be able to keep      this appointment.  She was advised to reschedule if needed.   1.  URINARY TRACT INFECTION.  Ceftin was continued and completed during the      hospital course.   CBC, BNP, BMET, chest x-ray, and x-ray of the cervical spine (for neck pain)  are all pending for August 29, 2005.  The patient will need to have her Foley  catheter discontinued prior to hospital discharge.  It is anticipated that  the patient will be discharged to home on either August 29, 2005 or August 30, 2005, if medically and clinically stable.      [name], M.D.  Electronically Signed     DF/MEDQ  D:  08/28/2005  T:  08/28/2005  Job:  [id number]   cc:   Rollene Rotunda, M.D.  [address]  [address]  [place]  [place] [address]   Wilhemina Bonito. [name], M.D. [facility]  [address]   [place]  [place] [address]

## 2010-07-09 NOTE — Discharge Summary (Signed)
[name], [name] NO.:  [id number]   MEDICAL RECORD NO.:  [id number]          PATIENT TYPE:  INP   LOCATION:  6708                         FACILITY:  [facility]   PHYSICIAN:  Michaelyn Barter, M.D. DATE OF BIRTH:  01/19/1951   DATE OF ADMISSION:  07/28/2005  DATE OF DISCHARGE:  08/03/2005                                 DISCHARGE SUMMARY   FINAL DIAGNOSIS:  1. Acute on chronic anemia.  2. Non-ST elevated myocardial infarction.  3. Bradycardia.  4. Hypotension.  5. Renal insufficiency.  6. Hyperlipidemia.  7. Hypoglycemia.  8. Left side diabetic retinopathy with vitreous hemorrhage causing the      patient visual disturbance.   PROCEDURES:  1. Cardiac catheterization completed June11,2007.  2. 2-D echocardiogram completed June8,2007.  3. Chest x-ray completed June11,2007.  4. Transfusion of 4 units of packed RBCs.   CONSULTATIONS:  1. Gastroenterology with Dr. Christella Hartigan.  2. Cardiology with [facility].  3. Ophthalmology with Dr. Maple Hudson.   HISTORY OF PRESENT ILLNESS:  Ms. Caradonna is a 60 year old female who stated  that on the morning of her admission she felt dizzy when standing and felt  as though she was going to pass out.  She stated that otherwise she had been  doing well excluding her developing an increase in nasal congestion for  which she saw her primary care physician earlier in the week and was started  on amoxicillin for 10 days secondary to a diagnosis of sinusitis.  She  denied having any shortness of breath or chest pain.  She also denied having  any bright red blood per rectum.  She complained of seeing floaters within  her left visual field. For past medical history please see that dictated by  Dr. Threasa Beards.   HOSPITAL COURSE:  1. Acute on chronic anemia.  The patient has a history of iron deficiency      anemia.  The patient's symptoms were suggestive of orthostasis and it      was felt that this was possibly secondary to the patient's  anemia.  Her      hemoglobin was 7.5 with hematocrit of 22.0.  The patient received two      units of packed RBCs at the time of her admission followed by two      additional units of packed RBCs over the course of her hospitalization.      Gastroenterology was consulted regarding the anemia and it is noted in      the daily notes that the patient was found to be guaiac positive.  Dr.      Christella Hartigan as well as Dr. Margarette Asal saw the patient.  Their note on June11      indicated the patient has recurrent symptomatic anemia also chronic      blood loss has been felt to be secondary to small bowel AVMs; however      numerous EGDs and colonoscopies have been done in the past and it has      not been possible to locate the lesions.  They questioned whether or  not hematology  would need to be consulted, regarding initiating      Procrit versus Epogen, and or parenteral iron therapy.  They stated      this could be done as  an outpatient.  They went on to state that there      was little more for GI to add.  By the date of the patient's discharge      her hemoglobin was noted to be 9.5 which is up from that at the time of      admission.  2. Non-ST elevated MI.  At the time of the patient's admission her      troponin I was noted to be less than 0.05 however, by June 8,  her      troponin was noted to have gone up to 0.38.  Cardiology was consulted      and [facility] cardiology saw the patient.  Per their assessment of the      patient on June8 Dr. Rollene Rotunda indicated that the patient had a      non-Q-wave MI.  He went on to state that the patient definitely has      coronary artery disease and that she was having some symptoms that      could be her anginal equivalent and were progressive and therefore      unstable.  He went on to state that the patient's profound anemia may      have been exacerbating her symptoms.  The decision was made to perform      a cardiac catheterization, on  June11,2007.  The final impression was      that the patient had a high-grade single-vessel coronary artery      disease.  A nonobstructive ostial left anterior descending calcified      segment was seen.  A well preserved EF was seen.  The final impression      by Dr. Rollene Rotunda was that the patient would not be a good      candidate for percutaneous revascularization because of the location of      the LAD.  It does not appear to be obstructive and he went on to state      that the symptoms that the patient presented with accompanied by her      elevated troponin were most likely related to her anemia and occluded      right coronary and that the patient should be managed medically.  It is      of note that the left main had a 50% long stenosis with calcium.  This      extended into the ostial LAD.  This was a large vessel wrapping the      apex.  There was ostial 60% stenosis in the LAD.  3. Bradycardia.  The patient was noted to have had an episode of      bradycardia during this hospitalization.  The patient apparently was on      beta blockers previously but these were placed on hold secondary to      heart rate less than 60.  Her symptom resolved by the time of her      discharge.  4. Hypotension.  The patient's blood pressure remained relatively      normotensive to slightly hypotensive during the earlier portion of her      hospitalization.  By the date of her discharge her blood pressure was  98/52; however, on the same date the patient indicated that she was      feeling better and therefore this was simply monitored over the course      of hospitalization.   1. Renal insufficiency.  At the time of the patient's admission her BUN      was noted to the 47, her creatinine 1.1 and over the course of      hospitalization, the patient's BUN and creatinine were monitored      closely. By ZOXW96 the patient's BUN was noted to be 24.  Her     creatinine 0.8.  By EAVW09 the  patient's BUN was noted to be 34.  Her      creatinine was 1.0.  Therefore this remained stable throughout the      course of hospitalization.  2. Hyperlipidemia.  The patient received Pravachol 80 mg p.o. q.d. for      this.  3. Hypoglycemia.  The patient had one episode of hypoglycemia during the      course of her hospitalization.  It is not documented that she actually      had any symptoms during this time, but on June8 1007, the patient's      sugar was noted to be 52.  For that she received one tube of instant      glucose along with 15 grams of carbohydrates snack.  Repeat CBG was      noted to be 94.   CONDITION AT THE TIME OF DISCHARGE:  On June13 the patient denied having any  chest pain or shortness of breath.  Her vitals her temperature was 98.2,  heart rate 59, respirations 20, blood pressure 98/52, O2 sat 98% on 2  liters. Her hemoglobin was 9.5, hematocrit 28.0 the decision was made to  discharge the patient from the hospital.  The patient was discharged home on  the following medications.  1. Ferrous sulfate 325 mg p.o. t.i.d.  2. Folic acid 1 mg p.o. q.d.  3. Avapro 150 mg p.o. q.d.  4. Claritin 10 mg 1 tablet p.o. q.d.  5. Metoprolol 12.5 mg tablet p.o. b.i.d.  6. Multivitamin 1 tablet p.o. q.d.  7. Zyprexa 2.5 mg 1 tablet p.o. q.d.  8. Actos 45 mg 1 tablet p.o. q.d.  9. Pravachol 80 mg 1 tablet p.o. q.d.  10.Altace 10 mg 1 tablet p.o. b.i.d.  11.Zoloft 1 mg p.o. q.d.   The patient was instructed not to take her metformin until she sees a  regular doctor.  She is also told to follow-up with Dr. Yancey Flemings [facility] on Tuesday July10 at 1:30 p.m. In addition she was also instructed to  follow-up with [facility] on July9 at 11 o'clock a.m. see Dr.  Antoine Poche and also of final note by GI they agreed with the patient starting  Procrit and apparently the patient was already seeing her own PA at home to  arrange this but again the patient was instructed to  follow-up with her  primary care doctor for further evaluation.      Michaelyn Barter, M.D.  Electronically Signed     OR/MEDQ  D:  10/09/2005  T:  10/10/2005  Job:  [id number]

## 2010-07-09 NOTE — Op Note (Signed)
NAMETANE, BIEGLER NO.:  [id number]   MEDICAL RECORD NO.:  [id number]          PATIENT TYPE:  INP   LOCATION:  2109                         FACILITY:  [facility]   PHYSICIAN:  Balinda Quails, M.D.    DATE OF BIRTH:  11-27-1950   DATE OF PROCEDURE:  09/07/2004  DATE OF DISCHARGE:                                 OPERATIVE REPORT   SURGEON:  Denman George, M.D.   ASSISTANT:  Jerold Coombe, P.A.   ANESTHETIC:  General endotracheal.   ANESTHESIOLOGIST:  Kaylyn Layer. Michelle Piper, M.D.   PREOPERATIVE DIAGNOSIS:  Severe symptomatic left internal carotid artery  stenosis.   POSTOPERATIVE DIAGNOSIS:  Severe symptomatic left internal carotid artery  stenosis.   PROCEDURE:  Left carotid endarterectomy and Dacron patch angioplasty.   CLINICAL NOTE:  [name] is a 60 year old diabetic female who was  evaluated in the office with a symptomatic left carotid stenosis.  This was  verified by Doppler and arteriography.  It was recommended she undergo left  carotid endarterectomy for reduction of stroke risk.  She consented for  surgery.  Risks of the operative procedure of a major complication were 1%  to 2%.   OPERATIVE PROCEDURE:  The patient was brought to the operating room in  stable condition.  Placed in a supine position.  General endotracheal  anesthesia induced.  Foley catheter and arterial line in place.   Curvilinear skin incision was made along the anterior border of the left  sternomastoid muscle.  Dissection was carried down through subcutaneous  tissue with electrocautery.  Platysma was divided.  Deep dissection carried  down anterior to the sternomastoid to expose the carotid sheath.  The facial  vein branches were ligated with 2-0 silk and 3-0 silk and divided.  The  carotid bifurcation was exposed.  The common carotid artery was immobilized  down to the omohyoid muscle and encircled with a Vesseloop.  The origin of  the superior thyroid and external  carotid were freed and encircled with  Vesseloops.  The internal carotid artery was followed distally up to the  posterior belly of the digastric muscle.  There was a large amount of plaque  in the internal carotid artery extending distally.  The hypoglossal nerve  was mobilized and retracted superiorly.  The vagus nerve was identified and  preserved.  The internal carotid artery was mobilized up beyond the plaque  disease and encircled with a Vesseloop.   The patient was administered 7000 units of heparin intravenously.  Adequate  circulation time permitted.  The carotid vessels were controlled clamps.  A  longitudinal arteriotomy was made in the distal common carotid artery.  The  arteriotomy extended across the carotid bulb and up into the internal  carotid artery.  There was a large amount of plaque at the origin of the  left internal carotid artery with a 90% stenosis.  A shunt was inserted.   An endarterectomy elevator was used to remove the plaque.  The  endarterectomy carried down in the common carotid artery where the plaque  was divided transversely with  Potts scissors.  Plaque was then raised up  into the bulb where the superior thyroid and external carotid were  endarterectomized using an eversion technique.  The distal internal carotid  artery plaque then feathered out.  Fragments of plaque were removed with  fine forceps.  The site irrigated with heparin and saline and dextran  solutions.   A patch angioplasty of the endarterectomy site was then carried out using  running 6-0 Prolene suture with a Finesse Dacron patch.  At completion of  the patch angioplasty, shunt was removed.  All vessels well-flushed. Clamps  were removed, directing the initial antegrade flow up the external carotid  artery, following this, the internal carotid was released.   There was an excellent pulse and Doppler signal in the distal internal  carotid artery.  The patient was administered 50 mg of  protamine  intravenously.  Adequate hemostasis was obtained.   Sternomastoid fascia was closed with running 2-0 Vicryl suture.  Platysma  reapproximated with running 3-0 Vicryl suture.  Skin closed with 4-0  Monocryl.  Steri-Strips applied.  Sterile dressing applied.   Anesthesia was reversed in the operating room.  The patient awakened  readily.  Moved all extremities to command.  Transferred to the recovery  room in stable condition.       PGH/MEDQ  D:  09/07/2004  T:  09/08/2004  Job:  [id number]   cc:   Maxwell Caul, M.D.  [address]  [place]  [place] [address]  Fax: [phone]

## 2010-07-09 NOTE — Consult Note (Signed)
NAME:  [name], ROADCAP NO.:  [id number]   MEDICAL RECORD NO.:  [id number]          PATIENT TYPE:  INP   LOCATION:  4736                         FACILITY:  [facility]   PHYSICIAN:  Rollene Rotunda, M.D.   DATE OF BIRTH:  01-21-1951   DATE OF CONSULTATION:  07/29/2005  DATE OF DISCHARGE:                                   CONSULTATION   PRIMARY CARE PHYSICIAN:  [name], NP at [facility]   REASON FOR CONSULTATION:  Evacuation of non-Q-wave myocardial infarction.   HISTORY OF PRESENT ILLNESS:  The patient is a lovely, 60 year old, white  female with history in the past apparently of diastolic dysfunction.  She  also has peripheral vascular disease as described below.  She has a 3- to 4-  year history of anemia.  She has had an extensive workup.  I did see some  notes from Dr. 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  She also tells  me that she probably has some chronic disease anemia.  She has required  transfusions periodically.  She became progressively weak and dizzy over the  last several days and had some orthostatic symptoms.  She was admitted with  anemia and a hemoglobin of 7.5.  She is now status post transfusion.  She  did have an elevated troponin which started out at 0.2 and is now up to 0.38  with negative CK-MB x3.  The patient is somewhat limited.  She takes care of  her disabled son.  She goes up and down stairs.  She has noticed she has had  decreasing exercise tolerance and increasing fatigue as well as dyspnea with  exertion.  She does not have any resting shortness of breath and has no PND  or orthopnea.  She does not describe chest pressure, neck discomfort, arm  discomfort, activity-induced nausea, vomiting/diaphoresis.  She has had no  palpitations, syncope or presyncope.   PAST MEDICAL HISTORY:  1.  Hypertension since 1989.  2.  Hyperlipidemia x7-8  years.  3.  Diabetes mellitus since 1991.  4.  COPD.  5.  Peripheral vascular disease (the patient reports a left renal stent 3-4      years ago, although we cannot find documentation of this).  6.  Left carotid stenosis, status post endarterectomy, diastolic      dysfunction, moderate mitral regurgitation on echocardiogram in 2003,      with an EF of 55-65%.  7.  Anemia.  8.  Obesity.  9.  Ongoing tobacco abuse.   PAST SURGICAL HISTORY:  1.  Carotid endarterectomy in July 2006.  2.  Cholecystectomy.  3.  Probable appendectomy at the time of C-section.   ALLERGIES:  No known drug allergies.   MEDICATIONS:  1.  Amaryl 2 mg b.i.d.  2.  Aspirin 81 mg daily.  3.  Lopressor 12.5 mg b.i.d.  4.  Claritin.  5.  Zocor 40 mg nightly.  6.  Zyprexa.  7.  Zoloft 100 mg daily.  8.  Altace 20 mg b.i.d.  9.  Lopid 600 mg b.i.d.  10. Actos 45 mg daily.  11. Avapro 300 mg daily.  12. Protonix 40 mg daily.  13. Sliding scale insulin.  14. Amoxicillin 500 mg t.i.d.  15. Ambien.   SOCIAL HISTORY:  The patient lives in [place] with her husband.  She  has two children.  She has a 30-pack-year smoking history and still smokes.  She does not drink alcohol.   FAMILY HISTORY:  Mother died from myocardial infarction in her 86s.  Father  died of MI with pneumonia in 23s.  Brother with MI in his 52s.   REVIEW OF SYSTEMS:  HEENT:  Positive for left eye floaters and sinusitis.  GASTROINTESTINAL:  Black stools recently.  Reflux.  Otherwise, negative for  other systems.   PHYSICAL EXAMINATION:  GENERAL:  The patient is in no distress.  VITAL SIGNS:  Blood pressure 60/9/53 with no orthostatic blood pressure  drop, afebrile, heart rate 82.  HEENT:  Eyes unremarkable.  Pupils equal round and reactive to light.  Fundi  not visualized.  Oral mucosa normal.  NECK:  No jugular venous distention.  Well-healed left carotid  endarterectomy scar.  Bilateral bruits.  No thyromegaly.  LYMPHATICS:  No  cervical, axillary or inguinal adenopathy.  LUNGS:  Clear to auscultation without wheezing or crackles.  BACK:  No costovertebral angle tenderness.  CHEST:  Unremarkable.  HEART:  PMI not displaced or sustained.  S1, S2 within normal limits.  No  S3, S4, no murmurs.  ABDOMEN:  Flat.  Positive bowel sounds.  Normal frequency, no rebound, no  bruits.  No pulsatile masses, organomegaly or hepatosplenomegaly.  SKIN:  No rashes.  EXTREMITIES:  2+ upper pulses, 2+ right femoral, absent left femoral, absent  left dorsalis pedis and tibialis bilaterally.  No clubbing, cyanosis or  edema.  NEUROLOGIC:  Oriented to person, place and time.  Cranial nerves 2-12  grossly intact.  Motor grossly intact.   LABORATORY DATA AND X-RAY FINDINGS:  EKG with sinus rhythm, rate 82, axis  within normal limits.  Intervals within normal limits.  No acute ST and T  wave changes.   WBC 6.6, hemoglobin 8.4, platelets 368.  Sodium 140, potassium 4.2, BUN 37,  creatinine 1.1.  Troponin 0.2, 0.36, 0.38.   IMPRESSION/RECOMMENDATIONS:  1.  Non-Q-wave myocardial infarction.  The patient definitely has coronary      artery disease.  The question is to what extent.  She is having some      symptoms that could be her anginal equivalent and are progressive and      therefore unstable.  Certainly, her profound anemia confounds this and      probably is an exacerbating factor.  However, at this point, I do not      think another stress test, which she has had in the past, would be      reasonable to evaluate disease burden.  I think there is a strong      possibility for balance ischemia or false negative on this test.  I      think we could safely do a diagnostic to understand her disease burden      and make decisions based on this.  We would have some difficulty with      percutaneous intervention and she probably is not a Plavix candidate,     although we would explore this further.  I have described in great  detail the risks and benefits of cardiac catheterization.  She      understands the procedure.  She can watch the film and be educated.  She      then agrees to proceed and I will post her for Monday.  2.  Anemia.  The patient currently is getting another transfusion today.      She can have complete blood count watched over the weekend.  3.  Risk reduction.  She is going to stop smoking and is getting a tobacco      consult.  Her diabetes is per her primary team.  She needs an LDL in the      22s and HDL in the 50s.           ______________________________  Rollene Rotunda, M.D.     JH/MEDQ  D:  07/29/2005  T:  07/29/2005  Job:  [id number]   cc:   [name], NP  [facility]

## 2010-07-09 NOTE — Discharge Summary (Signed)
NAMEJANEANN, [name] NO.:  [name]   MEDICAL RECORD NO.:  [id number]          PATIENT TYPE:  INP   LOCATION:  3705                         FACILITY:  [facility]   PHYSICIAN:  Bettey Mare. Lawrence, NPDATE OF BIRTH:  [name], [name]   DATE OF ADMISSION:  12/30/2005  DATE OF DISCHARGE:  12/31/2005                                 DISCHARGE SUMMARY   PRIMARY CARDIOLOGIST:  [name] Farber, MD   PRIMARY CARE PHYSICIAN:  [name], MD   PRIMARY DIAGNOSIS:  Hypotension status post angiography of  the right leg.   SECONDARY DIAGNOSES:  1. Peripheral vascular disease.  2. Coronary artery disease.  Has chronic occlusion of the right coronary      artery.  3. Diabetes mellitus, 1993.  4. Hypertension.  5. Chronic anemia treated with erythropoietin and iron.  6. Obesity.  7. Moderate mitral regurgitation.  8. History of congestive heart failure (CHF) with preserve of systolic      function.  9. Status post left carotid endarterectomy by Dr. Madilyn Fireman.   ALLERGIES:  NO KNOWN DRUG ALLERGIES.   DISCHARGE MEDICATIONS:  1. Lasix 80 mg one p.o. every day.  2. Potassium 10 mEq p.o. every day.  3. Toprol XL 25 mg p.o. every day.  4. Teveten 300 mg p.o. every day.  5. Iron 325 mg twice a day.  6. Pravachol 80 mg p.o. h.s.  7. Lopid 600 mg twice a day.  8. Zyprexa 12.5 mg once a day.  9. Zoloft 100 mg once a day.  10.Actos 15 mg one p.o. every day.  11.Metformin 500 mg one p.o. every day.  12.Altace 10 mg one p.o. every day.  13.Aspirin 325 mg p.o. every day.   PROCEDURES PERFORMED DURING HOSPITALIZATION:  Abdominal aortography and  angiography of the left leg to level of the proximal superficial femoral  artery and profunda balloon angioplasty of the left common femoral artery,  stenting of the left common femoral artery via ipsilateral approach per Dr.  Randa Evens on December 30, 2005.   HISTORY OF PRESENT ILLNESS:  This is a 60 year old obese female who was  admitted for a angiography of the lower extremities secondary to  atherosclerosis in the setting of diabetes and hypertension.  The patient  has a history of stenting of the left common and external iliac arteries in  October of 2007.  Symptoms did not improve, and she continued to have  claudication with abnormal ABI from 0.35 noted on the left.  The patient was  scheduled for outpatient angiography as described above.  The patient  subsequently had an episode of hypotension status post procedure with a  systolic blood pressure decreasing to the 80s, and the patient was  subsequently admitted secondary to these symptoms.  On admission, the  patient was seen and evaluated by Dr. Juanito Doom.  He noted that the patient  had just recently had angiography via right groin.  The patient's EKG was  evaluated with no changes.  There was no complaints of chest pain, shortness  of breath.  There was no bruits appreciated  in the groin, pelvis, or  abdomen.  The patient was given IV fluid bolus 500 mL and continued on IV  fluids at 125 mL an hour.  A followup CBC was ordered in the a.m.  The  patient was seen also by Dr. Randa Evens after the patient's admission.  A CBC was ordered and evaluated by Dr. Samule Ohm.  Hemoglobin was found to be  7.8.  Hematocrit was 23.4.  White blood cells 6.3 and platelets 302.  Dr.  Samule Ohm noted this and believed that there was no physical evidence of bleed.  The patient subsequently had 1 unit of packed red blood cells transfused  secondary to hemoglobin of 7.8, with followup CBC the following the morning.   The patient was seen and examined by Dr. Sherryl Manges on December 31, 2005,  with followup hemoglobin at 8.5, hematocrit 25.5, white blood cells 5.2, and  platelets 290.  Blood pressure was found to be 124/66, heart rate 77, and  respirations 16.  The patient was found to be stable and able to go home.  There was no evidence of bleeding found.   LABS ON DISCHARGE:   CBC as stated above.  PT 12.1, INR 1.0, PTT 41.1, sodium  142, potassium 3.8, chloride 104, CO2 28, glucose 87, BUN 26, creatinine  0.7, BNP 564.   FOLLOWUP PLANS AND APPOINTMENTS:  The patient will be scheduled to see Dr.  Randa Evens in 2 weeks.  The office is to call for follow up appointment,  date, and time.  The patient was given post angiography instructions for  follow up.  The patient was advised if she had any chest discomfort, she was  also to report any redness, drainage, warmth, or increased swelling from the  right groin, or any bleeding from the right groin puncture site, any  numbness, tingling in the affected extremity or pain.  The patient was to  return to prior medications as directed at home, with the exception of the  metformin, which she is to hold for an additional 24 hours, and restart as  directed.  No prescriptions were given to the patient on discharge.   Duration of discharge encounter:  [name] minutes.      Bettey Mare. Lyman Bishop, NP     KML/MEDQ  D:  12/31/2005  T:  01/01/2006  Job:  [id number]   cc:   Dr. Cherrie Distance

## 2010-07-09 NOTE — Discharge Summary (Signed)
NAMEBREANE, [name] NO.:  [id number]   MEDICAL RECORD NO.:  [id number]          PATIENT TYPE:  INP   LOCATION:  4713                         FACILITY:  [facility]   PHYSICIAN:  Elliot Cousin, M.D.    DATE OF BIRTH:  [birth year]   DATE OF ADMISSION:  08/18/2005  DATE OF DISCHARGE:  08/23/2005                                 DISCHARGE SUMMARY   DISCHARGE DIAGNOSES:  1.  Acute renal failure secondary to prerenal azotemia in the setting of      nausea, vomiting and diarrhea, and also in the setting of ARB (Avapro      and Teveten) ACE inhibitor(Alatace), and Lasix therapy.  2.  Hyponatremia and hyperkalemia secondary to number one.  3.  Relative hypotension secondary to hypovolemia.  4  Nausea, vomiting, diarrhea, quickly resolved with volume repletion.  5  Urinary tract infection/pyelonephritis.  1.  Acute on chronic anemia secondary to iron deficiency and possibly an      occult gastrointestinal bleed. The patient has had an extensive      gastrointestinal workup in the past.  Please see the history and      physical from June7,2007.      1.  Status post 2 units of packed red blood cell transfusions during the          hospital course.  2.  Peripheral edema.  3.  Type 2 diabetes mellitus.   For the secondary discharge diagnoses and past medical history, please see  the dictated history and physical.   DISCHARGE MEDICATIONS:  1.  Ceftin 500 mg b.i.d. for three more days.  2.  Phenergan 12.5 mg q.6 h p.r.n.  3.  Potassium chloride 20 mEq daily for 7 days and then adjustment per your      doctor.  4.  Ferrous sulfate 325 mg b.i.d.  5.  Toprol XL 50 mg b.i.d.  6.  Pravachol 80 mg q.h.s.  7.  Lopid 600 mg b.i.d.  8.  Zyprexa 2.5 mg daily.  9.  Lasix 80 mg daily.  10. Teveten 600 mg, 1 tablet daily.  11. Avapro, stop.  12. Altace, do not take yet.  13. Actos 15 mg daily.  14. Metformin 1,000 mg, half a tablet daily.  15. Amaryl, do not take yet.  16. Aranesp  as prescribed by your physician/primary care Levette Paulick.   DISCHARGE DISPOSITION:  The patient was discharged to home in improved and  stable condition on July3,2007.   1.  She was advised to follow up with Ms. Ian Bushman in 3-5 days.  2.  She has an appointment to follow up with her gastroenterologist, Dr.      Marina Goodell, [phone].   HISTORY OF PRESENT ILLNESS:  The patient is a 60 year old lady with a past  medical history significant for coronary artery disease with a recent non-Q-  wave myocardial infarction in the earlier part of June2007, who presented to  the emergency department on June28,2007 with a 3-day history of nausea,  vomiting and diarrhea, accompanied by generalized weakness.  The patient  denied any bright red  blood per rectum, black tarry stools, or coffee-ground  emesis.  The patient denied subjective fever and chills.  She did however  continue to take all of her antihypertensive medications and Lasix.  When  the patient was evaluated in the emergency department, she was found to be  hypotensive, hyperkalemia, and in acute renal failure.  The patient was  therefore, admitted for further evaluation and management.   HOSPITAL COURSE:  1.  ACUTE RENAL FAILURE, HYPONATREMIA, HYPERKALEMIA, ANEMIA, HYPOTENSION,      NAUSEA, VOMITING, AND DIARRHEA.  On admission, the patient's blood      pressure was 95/43.  Her hemoglobin was 7.  Her BUN was 74 and her      creatinine was 2.9.  Her sodium was 126, and her potassium was 5.7.  Her      cardiac markers were negative.  For treatment, the patient was started      on IV fluids with normal saline at 125 mL/hr.  She was typed and crossed      and then transfused 2 units of packed red blood cells as well.  The      antihypertensive medications including Toprol XL, Teveten, Altace, and      Avapro were all withheld initially on admission.  The Lasix was also      discontinued.  Following several days of treatment, the patient's acute       renal failure as well as the electrolyte abnormalities completely      resolved.  As of today, the patient's serum sodium is 138, the potassium      is 3.6, the BUN is 18, and the creatinine is 0.8.  In review of the      patients medications, it was noted that the patient was actually taking      to two ARBs, and an ACE inhibitor.  It is very possible that the nausea,      vomiting, and diarrhea may have actually been a consequence of acute      renal failure in the setting of multiple ARB/ACE inhibitor therapy.  It      is unclear why the patient was discharged to home on Avapro and Teveten      during her previous hospitalization;  this may have been an oversight.      However, the patient was advised to restart the Teveten and to hold the      Altace until she follows up with her primary care Johnika Escareno in the office      in 3-5 days.  The Avapro was completely discontinued and the patient was      advised to not take it.   1.  RELATIVE HYPOTENSION SECONDARY TO HYPOVOLEMIA.  As stated above, the      patient's systolic blood pressure was 95 on admission.  She was volume-      replenished as stated above.  Over the course of the past 48 hours, her      blood pressure has improved.  She was restarted on Toprol XL 50 mg      b.i.d. The Teveten will be restarted at home.  The Avapro has been      discontinued and the Altace is currently on hold, until she is re-      evaluated by her primary care Hendrix Console in the office.  At the time of      hospital discharge, her systolic blood pressure was ranging from 130-  140.   1.  NAUSEA, VOMITING, AND DIARRHEA.  The patient's symptoms completely      resolved following volume repletion.  She did have evidence of an      urinary tract infection which may have been pyelonephritis.  The urinary      tract infection may have contributed to the nausea, vomiting, and     diarrhea.  As stated above, the multiple antihypertensive medications      may  have caused or triggered the nausea.  A stool specimen was sent for      C. diff and it was completely negative.  The patient was started on      antibiotic treatment with Rocephin daily.  She completed a 3-day course      of Rocephin.  She will be discharged to home on 4 more days of      antibiotic therapy with Ceftin 500 mg b.i.d.   1.  ACUTE ON CHRONIC ANEMIA.  The patient has a history of chronic anemia      thought to be secondary to iron deficiency and possibly an occult GI      bleed.  Her gastroenterologist is Dr. Marina Goodell.  Dr. Marina Goodell has performed an      EGD, colonoscopy, and capsule endoscopy in the past with results being      unremarkable.  On admission, the patient's hemoglobin was 7.0.  She was      transfused 2 units of packed red blood cells during the hospital course.      At the time of hospital discharge, her hemoglobin remained stable at 9.2      and her hematocrit remained stable at 28.0.  Per the patient's history,      she will be receiving injections, possibly Aranesp or Procrit, in the      outpatient setting under the guidance of her primary care Crayton Savarese.  The      patient was discharged home as well on ferrous sulfate 325 mg b.i.d.      She has a follow-up appointment with Dr. Marina Goodell in 1 week.   1.  PERIPHERAL EDEMA.  Following volume repletion, the patient developed      peripheral edema.  The lung exam revealed clear lungs.  Because of the      peripheral edema, the IV fluids were discontinued yesterday and she was      restarted on Lasix 80 mg daily.   1.  TYPE 2 DIABETES MELLITUS.  Throughout the hospitalization, the patient's      capillary blood sugars were well-controlled.  In fact, they were at      times marginally low.  In the outpatient setting, the patient has been      chronically treated with 3 grams of metformin daily, 15 mg of Actos      daily, and 2 mg of Amaryl b.i.d.  At the time of hospital discharge, the      patient was advised to continue  Actos at 15 mg daily and to decrease the      metformin to 500 mg daily and to not take the Amaryl until she is re-      evaluated by her primary care Nevada Kirchner.  The patient, however, was      advised to call her primary care Yannely Kintzel if her capillary blood sugars      became uncontrolled, consistently above 200.   DISCHARGE LABORATORY:  Hemoglobin 9.2, hematocrit 28.0.  Sodium 138,  potassium  3.6, chloride 108, CO2 23, glucose 87, BUN 18, creatinine 0.8.  Albumin 2.4, calcium 7.7, phosphorus 2.1.  C. diff toxin negative.      Elliot Cousin, M.D.  Electronically Signed     DF/MEDQ  D:  08/23/2005  T:  08/23/2005  Job:  [id number]   cc:   Attn:  [name] [facility]   Rollene Rotunda, M.D.  [address]  [address]  [place]  [place] [address]   Wilhemina Bonito. Marina Goodell, M.D. [facility]  [address]  [place]  [place] [address]

## 2010-07-09 NOTE — Discharge Summary (Signed)
NAME:  [name], [name]                           ACCOUNT NO.:  [id number]   MEDICAL RECORD NO.:  [id number]                   PATIENT TYPE:  INP   LOCATION:  0444                                 FACILITY:  [facility]   PHYSICIAN:  Malcolm T. Russella Dar, M.D. [facility]          DATE OF BIRTH:  Jul 18, 1950   DATE OF ADMISSION:  10/20/2003  DATE OF DISCHARGE:  10/23/2003                                 DISCHARGE SUMMARY   ADMISSION DIAGNOSES:  7.  A 60 year old white female with subacute gastrointestinal bleed with      progressive anemia and positive capsule endoscopy for evidence of      bleeding, but no definite lesion identified.  Rule out small bowel      ulceration secondary to aspirin, small bowel arteriovenous      malformations, inflammatory bowel disease, or possible neoplasm.  2.  Anemia, symptomatic.  3.  Chronic obstructive pulmonary disease.  4.  Status post cholecystectomy and cesarean section x3.  5.  History of gastroesophageal reflux disease.  6.  Adult onset diabetes mellitus.  7.  Hyperlipidemia.  8.  Hypertension.  9.  Diverticulosis.   DISCHARGE DIAGNOSES:  1.  Anemia secondary to slow gastrointestinal blood loss with no evidence      for ongoing active bleeding.  Stable hemoglobin post-transfusions.  2.  Evidence for small bowel source for blood loss, though unable to      identify definite lesion at capsule endoscopy with negative      esophagogastroduodenoscopy and enteroscopy, as well as CT scan this      admission.  3.  Chronic obstructive pulmonary disease.  4.  Status post cholecystectomy and cesarean section x3.  5.  History of gastroesophageal reflux disease.  6.  Adult onset diabetes mellitus.  7.  Hyperlipidemia.  8.  Hypertension.  9.  Diverticulosis.   CONSULTATIONS:  None.   PROCEDURES:  1.  Upper endoscopy and small bowel enteroscopy per Dr. Claudette Head.  2.  CT scan of the abdomen and pelvis.   HISTORY:  [name] is a pleasant 60 year old white female,  primary patient of  Dr. Wendee Copp, also known to Dr. Marina Goodell with a history as described above.  She had been undergoing outpatient evaluation for anemia and heme positive  stool, had previous colonoscopy done in 2003 which was negative with the  exception of some diverticulosis.  She had seen Dr. Marina Goodell in August 2005,  had undergone upper endoscopy which was negative for source of her anemia.  Her hemoglobin when checked on September 22, 2003, was 60.1.  She was then  scheduled for a capsule endoscopy which was done on October 16, 2003.  This  showed multiple areas in the proximal small bowel with positive blood  indicator, and there was blood noted in the lumen, however, no definite  lesions were seen other than a couple of erosions.  The patient had gone to  see another  physician earlier in the day on the day of admission, was noted  to be pale, repeat hemoglobin was done and this was 7.4.  She was  subsequently sent to the emergency room for evaluation, is now seen and  evaluated, found to be hemodynamically stable.  Stool by the ER physician  was blackish with red tinge and heme positive.  GI was called, and she is  admitted at this time to undergo transfusions and then further diagnostic  evaluation.  The patient has no complaints of abdominal pain or discomfort.  Her appetite had been fine.  Weight had been stable.  No nausea or vomiting.   LABORATORY DATA:  On admission, hemoglobin 7.4, hematocrit 23.1, WBC of  11.1, MCV of 80, platelets 430.  Serial values were obtained.  Post-  transfusion on October 21, 2003, hemoglobin was 9.9, hematocrit of 30.1.  On  October 22, 2003, hemoglobin was 9.7, hematocrit of 29.9.  On October 23, 2003, hemoglobin was 9.7, hematocrit of 29.6.  Prothrombin time 12.7, INR  0.9.  Electrolytes within normal limits.  BUN 19, creatinine 0.9, albumin  3.1.  Liver function tests normal with the exception of an alkaline  phosphatase of 143.   X-ray studies:  CT  scan of the abdomen and pelvis done on October 22, 2003,  showed somewhat suboptimal jejunal opacity, no evidence of mass, adenopathy,  etc.  Otherwise, negative study with normal-appearing uterus and ovaries.   HOSPITAL COURSE:  The patient was admitted to the service of Dr. Claudette Head who was covering the hospital.  She was placed on full liquids, typed  and crossed, and transfused 2 units of packed red blood cells for a  hemoglobin of 7.4.  She complained of weakness, but had not noted any  obvious bleeding at home.  She underwent upper endoscopy and small bowel  enteroscopy with Dr. Russella Dar the following day.  This was a normal examination  with approximately 35 cm of the jejunum examined.  She had been on aspirin  at home, and we decided to hold this feeling that part of her problem may  have been erosions which were aspirin induced.  She initially was to have  small bowel enteroclysis done, however, there was no contrast media  available for that procedure, and it was suggested by the radiologist to  proceed with CT scan of the abdomen and pelvis to evaluate the small bowel.  This was done, and had somewhat suboptimal visualization apparently of the  jejunum, but was otherwise a negative study.  The patient did not have any  evidence of ongoing active bleeding during her hospitalization, and her  hemoglobin remained stable post-transfusion, and her energy level was  improved.  She was allowed discharge to home on October 23, 2003, with  instructions to have her hemoglobin repeated on the week of October 27, 2003, and also the week after.  She was to follow up with Dr. Marina Goodell on  November 06, 2003, at 8:30 a.m., and to call for any problems in the  interim.  She was to stay off of aspirin.  She was to continue Protonix 40  mg p.o. daily, and we gave her samples at the time of discharge.  She was to  resume her Glucophage on October 25, 2003, continue her Amaryl, Zyprexa, Lasix,  Lopid, Zoloft, and Altace, as well as Toprol as previous.  She was  also taking a generic iron supplement and was asked to continue this as  well.  If she continues to have evidence of slow GI bleeding, she may  require repeat capsule endoscopy and/or intraoperative endoscopy and  enteroscopy.     Amy Esterwood, P.A.-C. [facility]                Malcolm T. Russella Dar, M.D. [facility]    AE/MEDQ  D:  10/31/2003  T:  10/31/2003  Job:  [id number]   cc:   Clydie Braun L. Hal Hope, M.D.  [address] [place]  [place] [address]  Fax: [phone]

## 2010-07-09 NOTE — H&P (Signed)
[name], [name] NO.:  [id number]   MEDICAL RECORD NO.:  [id number]          PATIENT TYPE:  INP   LOCATION:  1826                         FACILITY:  [facility]   PHYSICIAN:  Lonia Blood, M.D.DATE OF BIRTH:  12-04-1950   DATE OF ADMISSION:  12/10/2005  DATE OF DISCHARGE:                                HISTORY & PHYSICAL   PRIMARY CARE PHYSICIAN:  Medical laboratory scientific officer.   CARDIOVASCULAR PHYSICIAN:  Dr. Samule Ohm with [facility].   GI PHYSICIAN:  Dr. Marina Goodell with [facility].   CHIEF COMPLAINT:  Dizziness with weakness.   HISTORY OF PRESENT ILLNESS:  [name] is a very pleasant 60 year old  female with a very complex medical history as detailed below.  Of note, she  has a history of chronic anemia for which she has undergone an extensive  workup in the past.  She, in fact, saw Dr. Marina Goodell just the other day on  December 06, 2005.  Recently, she has been suffering with claudication  symptoms affecting the buttocks and the left leg specifically.  She  underwent an evaluation by Dr. Samule Ohm with [facility].  As the  patient's workup was ongoing, Plavix therapy was initiated approximately 4-5  days ago.  The patient reported that at 24 hours after initiating Plavix  therapy, she began to experience the new onset of pitch black gummy stools.  She failed to mention this to her primary care physician or her cardiologist  initially.  She presented yesterday to the Vascular Lab where she underwent  a peripheral vascular catheterization and had a stent placed in her left  iliac artery due to a severe left iliac artery stenosis.  The patient did  not have any immediate postoperative complications and was able to be  discharged home.  She continued to have black tarry stools but did not point  this issue out to any of the numerous members of the medical staff that were  caring for her.  Today, when she woke up, she noticed that she was  experiencing some  lightheadedness.  She felt weak in general.  As a result,  she presented to the emergency room for evaluation.  In the emergency room,  she was found to have a hemoglobin of 7.6.  Hemoglobin was noted to be 10.7  on December 05, 2005 during her preoperative evaluation.  The patient has not  been vomiting blood.  The patient does not have abdominal pain. The patient  has not experienced swelling in her groin nor has she noted significant  blood loss from the puncture wound in her right groin.  She has not noticed  swelling of the abdomen nor does she complain of abdominal tenderness.   REVIEW OF SYSTEMS:  A comprehensive review of systems is unremarkable with  exception to the multiple positive elements noted in the history of present  illness above.   PAST MEDICAL HISTORY:  1. Diastolic congestive heart failure.  2. Mild mitral regurgitation.  3. Hypertension.  4. Chronic anemia.      a.     Deemed to be  secondary to iron deficiency.      b.     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      c.     Last evaluated by GI physician December 06, 2005 at which time it       was reported that no further interventions were felt to be indicated       or of benefit to the patient.      d.     Status post previous EGD with enteroscopy and capsule endoscopy       August 2005 with colonoscopy June 2003.      e.     A recent bone marrow biopsy and full evaluation by Dr. Dalene Carrow       with Hematology resulting in initiation of iron therapy and Aranesp.      f.     Hemoglobin 8.5, October 31, 2005.      g.     Hemoglobin 10.7, December 05, 2005.  5. Tobacco abuse in the amount of 1 pack per day.  6. Coronary artery disease-50% left main, 60% ostial left anterior      descending, 30% ramus intermedius, and 100% right coronary artery with      collateral flow.  7. Obesity.  8. Diabetes mellitus type 2.  9. Status post left carotid endarterectomy in the distant past  by Dr. Liliane Bade.  10.Status post C-section x3.  11.Status post cholecystectomy.  12.Status post tonsillectomy.  13.Severe peripheral vascular disease status post left iliac PTA with      stent December 09, 2005.   OUTPATIENT MEDICATIONS:  1. Allegra 180 daily.  2. Zyprexa 2.5 mg p.o. daily.  3. Pravachol 80 mg daily.  4. Zoloft 100 mg daily.  5. Actos 45 mg daily.  6. Metformin 500 mg at a.m.  7. Teveten 600 mg b.i.d.  8. Altace 20 mg b.i.d.  9. Ambien p.r.n.  10.Lopid 600 mg b.i.d.  11.Plavix 75 mg daily.  12.Amoxicillin 500 mg t.i.d.  13.Aranesp 10,000 units q. week.  14.Amaryl 2 mg b.i.d.  15.Iron sulfate b.i.d.  16.Lasix 80 mg daily.   ALLERGIES:  No known drug allergies.   FAMILY HISTORY:  The patient's father died at the age of 63 secondary to  pneumonia.  The patient's mother died at age 21 secondary to MI. The  patient's brother died at age 45 secondary to bladder cancer.   SOCIAL HISTORY:  The patient is married.  She is a housewife.  She has two  grown children.  She does not drink alcohol.   DATA REVIEWED:  Hemoglobin is 7.6.  White count, platelet count, and MCV are  normal.  Sodium, potassium, and chloride are normal.  BUN is elevated at 39  with a creatinine of 1.1.  Serum glucose is elevated at 130.  pH and PCO2  were normal.  The patient is guaiac positive.  CT scan of the abdomen  reveals no evidence of retroperitoneal hematoma and the presence of a left  external iliac artery stent.   PHYSICAL EXAMINATION:  VITAL SIGNS:  Temperature 97.4.  Blood pressure  115/56.  Heart rate 72.  Respiratory rate 20.  O2 saturation is 100% on room  air.  GENERAL:  A well-developed, well-nourished female in no acute respiratory  distress.  HEENT:  Normocephalic, atraumatic.  Pupils equal, round, and reactant to  light and accommodation.  Extraocular muscles are intact.  Both ears, EACs  are noted to be clear. NECK:  No JVD; no lymphadenopathy; no thyromegaly.   LUNGS:  Clear to auscultation bilaterally without wheezes or rhonchi.  CARDIOVASCULAR:  Regular rate and rhythm without murmur, gallop, or rub.  Normal S1, S2.  ABDOMEN:  Obese, soft.  Bowel sounds are present.  No hepatosplenomegaly.  No rebound.  No ascites.  Nontender to deep palpation throughout.  GROIN:  The patient has a benign-appearing puncture wound in the left groin  with no evidence of hematoma; no bruit or thrill, and no active bleeding  whatsoever.  The patient's left groin is unremarkable.  EXTREMITIES:  Trace bilateral lower extremities without cyanosis or  clubbing.  NEUROLOGIC EXAMINATION:  Alert and oriented x4.  Cranial nerves II-XII are  intact bilaterally, with 5/5 strength to bilateral upper and lower  extremities; intact sensation is noted throughout.  GU:  Guaiac positive in the emergency room.   IMPRESSION AND PLAN:  1. Gastrointestinal bleeding-the patient's history of melanotic stools      does in fact coincide with the initiation of Plavix therapy.  I feel      this is most likely the etiology of her bleeding with Plavix      stimulating bleeding from her arteriovenous malformations.  I have had      a conversation with Dr. Sharrell Ku who has been nice enough to discuss      this case with Dr. Samule Ohm.  We all agree that discontinuation of Plavix      therapy would be most appropriate.  I am therefore discontinuing Plavix      therapy.  We will treat the patient's anemia as detailed below.  I do      not anticipate that any further intervention will be necessary.  It is      my hope that with a simple transfusion the patient will be cleared for      discharge within 23 hours.  2. Acute blood loss anemia-the patient does indeed have a longstanding      history of chronic anemia.  Nonetheless, she has exhibited a      significant decrease in her hemoglobin from 10.7 just 5 days ago to 7.6      at the present time.  She is mildly  hypotensive and she is       symptomatic.  We will therefore transfuse her empirically with 2 units      of packed red blood cells.  We will follow a CBC every 8 hours      thereafter and assure that the patient's hemoglobin remains stable, at      least 8.5 or preferably a 10.  If the patient's hemoglobin stabilizes      with the discontinuation of her Plavix therapy and after her      transfusion, she will be cleared for discharge.  I do not feel that      anything would be gained by pursuing further gastrointestinal      evaluation.  3. Diabetes mellitus.  The patient's diabetes is poorly controlled.  We      will place the patient on sliding-scale insulin and also dose her with      scheduled meal coverage.  We will continue her Actos and her Amaryl but      we will hold her metformin as she has recently undergone a contrast-      requiring procedure.  4. Tobacco abuse.  The patient has  been counseled extensively as to the     multiple deleterious effects of ongoing tobacco abuse.  She has been      advised that she should discontinue smoking immediately.      Lonia Blood, M.D.  Electronically Signed     JTM/MEDQ  D:  12/10/2005  T:  12/10/2005  Job:  [id number]   cc:   [facility]  Salvadore Farber, MD  Wilhemina Bonito. Eda Keys., MD

## 2010-07-09 NOTE — Op Note (Signed)
[name], [name] NO.:  [id number]   MEDICAL RECORD NO.:  [id number]          PATIENT TYPE:  INP   LOCATION:  3705                         FACILITY:  [facility]   PHYSICIAN:  Salvadore Farber, MD  DATE OF BIRTH:  February 11, 1951   DATE OF PROCEDURE:  12/30/2005  DATE OF DISCHARGE:                                 OPERATIVE REPORT   PROCEDURE:  Abdominal aortography, angiography of the left leg to the level  of the proximal superficial femoral artery and profunda, balloon angioplasty  of the left common femoral artery, stenting of the left common femoral  artery via ipsilateral approach.   INDICATIONS:  [name] is a 60 year old lady with atherosclerosis in the  setting of diabetes mellitus and hypertension.  She underwent stenting of  the left common and external iliac arteries on December 09, 2005.  Symptoms  were not improved and she continued to have claudication.  She has not had  rest pain or tissue loss.  However, ABI dropped from 0.45 to 0.35.  Duplex  ultrasound suggested possible dissection of the distal stent margin.  She  returns for repeat angiography and probable percutaneous intervention.   PROCEDURE TECHNIQUE:  Using a Smart needle, a 6-French sheath was placed in  the right common femoral artery using the modified Seldinger technique.  A  Wholey wire was advanced into the abdominal aorta without difficulty.  A 5-  French sheath was placed and a 5-French pigtail catheter advanced in the  infrarenal abdominal aorta.  Abdominal aortography was performed by power  injection.  Additional injections via the right common femoral sheath were  used to assess the disease of the left external iliac and common femoral  artery.  I then used an Indole catheter to gain access to the left common  iliac artery.  Angiography was performed.  This confirmed severe stenosis  just distal to the stented segment of the external iliac extending into the  common femoral.  We  decided to treat this percutaneously.   Anticoagulation was initiated with heparin to achieve an ACT of greater than  225 seconds.  I advanced an Amplatz wire into the external iliac artery and  over this exchanged for a 6-French Terumo glide sheath which was advanced  into the left common femoral artery.  Repeat angiography was performed.  I  attempted to advance a Wholey wire across the dissected segment but could  not gain access to the true lumen.  I was concerned that I would extend the  dissection.  I, therefore, decided to treat the dissection from below in a  retrograde approach.   To that end, I placed a 6-French sheath in the left common femoral artery  using the modified Seldinger technique.  A Wholey wire advanced with ease  across the dissection into the abdominal aorta.  Intraluminal position of  the sheath was confirmed by angiography from above.  I then proceeded to  balloon angioplasty of the dissected segment using a 4 x 30 mm Powerflex at  4 atmospheres for 90 seconds.  This resulted in a suboptimal  result with  residual stenosis still greater than 90%.  I then proceeded to stenting  using a 6 x 40 mm Abbot Exceed stent deployed so as to overlap the distal  portion of the previously placed stent and in the most proximal relatively  normal vessel in the common femoral at approximately the level of the  inguinal ligament.  I then post dilated the stent using a 4 x 30 mm  Powerflex at 6 atmospheres.  This balloon under sized.  I further post  dilated using a 5 x 20 mm Powerflex for two inflations each at 10  atmospheres.  Final angiography demonstrated no residual stenosis in the  stented segment, no residual filling of the false lumen, and normal flow  distally.  The patient was then transferred to the holding room in stable  condition.  Both sheaths were removed there.   COMPLICATIONS:  None.   FINDINGS:  1. Right leg:  Imaging after intra-arterial nitroglycerin  demonstrated      diffuse disease of the common iliac, external iliac, and common      femoral.  There is a very low bifurcation of the common femoral.  Below      the level of the hip but above the bifurcation, there is an 80%      stenosis in the common femoral.  However, even the normal vessel is,      at most, 4 mm in diameter.  Both profunda and SFA remained patent      proximally as demonstrated previously.  2. Left leg:  Patent common and external iliac artery stents with patent      internal iliac artery.  There was a near occlusive dissection at the      distal stent margin resulting in approximately 90% stenosis.  This had      unsuccessful balloon angioplasty and was treated with additional stent      resulting in no residual stenosis.   IMPRESSION/PLAN:  Successful revascularization of the left leg with repair  of the dissection. The patient had a GI bleed on Plavix recently.  We will,  therefore, treat her with aspirin alone.      Salvadore Farber, MD  Electronically Signed     WED/MEDQ  D:  12/30/2005  T:  12/30/2005  Job:  [id number]   cc:   Rollene Rotunda, MD, [facility]

## 2010-07-09 NOTE — Op Note (Signed)
[name], TAT NO.:  [id number]   MEDICAL RECORD NO.:  [id number]          PATIENT TYPE:  OIB   LOCATION:  6529                         FACILITY:  [facility]   PHYSICIAN:  Salvadore Farber, MD  DATE OF BIRTH:  Jun 22, 1950   DATE OF PROCEDURE:  12/09/2005  DATE OF DISCHARGE:  12/09/2005                                 OPERATIVE REPORT   PROCEDURE:  Abdominal aortography, with bilateral lower extremity runoff,  stenting of the left external iliac artery.   INDICATIONS:  [name] is a 60 year old lady with atherosclerosis in the  setting of diabetes mellitus and hypertension.  She presents with two years  of squeezing pain in her left buttock and calf.  When I saw her in the  office two weeks ago, she told me that symptoms are roughly equal in both  leg.  However, today she says that she has minimal if any symptoms in her  right leg.  ABIs were 0.73 on the right and 0.45 on the left.  She presents  for angiography, with an eye to percutaneous intervention on the left leg.   PROCEDURE TECHNIQUE:  Informed consent was obtained.  Under 1% lidocaine  local anesthesia, a 5-French sheath was placed in the right common femoral  artery using the modified Seldinger technique.  A pigtail catheter was  advanced over a wire and positioned in the suprarenal abdominal aorta.  Abdominal aortography was performed by power injection.  Pigtail was then  pulled back to the distal abdominal aorta, and abdominal aortography with  bilateral extremity runoff to the feet was performed by power injection  using digital subtraction technique.  Oblique views of the pelvis were then  obtained.  These images demonstrated serial 80% and 50% stenosis in the left  external iliac artery.  To better clarify their significance, I advanced a  wire across these from the contralateral side and then advanced an end-hole  catheter across it into the common femoral.  I pulled the catheter back,  measuring pressure.  This demonstrated the 50% stenosis to have  approximately a 20 mmHg resting gradient and the 80% stenosis to have an  additional 20 mmHg gradient, for a total gradient of approximately 40 mmHg.  Based on this evidence, decided to intervene.   Sheath was exchanged over a wire to a 54F Terumo glide sheath.  This was  advanced into the left common iliac artery.  The [facility] wire was then  advanced into the proximal portion of the left SFA.  I then placed a 7 x 80  mm Protege stent covering the two stenoses and also jailing the origin of  the internal iliac.  I then postdilated the stent using a 6 x 30 mm  Powerflex at 6 atmospheres at the distal stenosis and 8 atmospheres at the  proximal stenosis.  Final angiography demonstrated no residual stenosis, no  dissection, and normal flow distally.   COMPLICATIONS:  None.   FINDINGS:  1. Abdominal aorta:  Plaque, without stenosis or aneurysm.  2. Renal arteries:  Single vessels bilaterally.  There is  a 20% stenosis      of proximal left renal.  The right is normal.  3. Right leg:  Normal common iliac and internal iliac artery.  The      external iliac is severely and diffusely diseased.  Imaging is      suboptimal because the sheath is nearly occlusive within the vessel.      The profunda and SFA are widely patent.  The SFA is small diameter,      consistent with diffuse disease.  However, there is no focal stenosis.      Popliteal is widely patent.  There is three-vessel runoff to the foot.  4. Left leg:  Ostial 30% stenosis of the common iliac.  The internal iliac      is patent.  The external iliac has had an ostial 80% stenosis and a 50%      stenosis in its midsection.  These were both stented to no residual.      Just distal to the stented segment is a 40% stenosis which had no      gradient on pullback and was therefore not treated.  The remainder of      the common femoral is normal.  The profunda is patent.  There  is      diffuse mild disease of the proximal SFA.  The mid-SFA has a 50%      stenosis.  There is a 50% stenosis of the above-knee popliteal.  There      is three-vessel runoff to the foot.   IMPRESSION/RECOMMENDATIONS:  Successful revascularization of the left  external iliac.  Will see the patient back in the office and check repeat  ABIs.  The right external iliac was not treated, as the patient says today  that the right leg is not substantially bothersome to her.      Salvadore Farber, MD  Electronically Signed     WED/MEDQ  D:  12/09/2005  T:  12/10/2005  Job:  [id number]   cc:   [name], M.D,  Rollene Rotunda, MD, [facility]

## 2010-07-09 NOTE — Discharge Summary (Signed)
NAME:  [name], [name]                           ACCOUNT NO.:  [id number]   MEDICAL RECORD NO.:  [id number]                   PATIENT TYPE:  INP   LOCATION:  0347                                 FACILITY:  [facility]   PHYSICIAN:  Deirdre Peer. Polite, M.D.              DATE OF BIRTH:  1950-10-04   DATE OF ADMISSION:  04/15/2002  DATE OF DISCHARGE:                                 DISCHARGE SUMMARY   DISCHARGE DIAGNOSES:  1. Chronic obstructive pulmonary disease exacerbation.  Condition on     discharge stable without oxygen requirement for ambulation and     saturations 92%.  2. Tobacco abuse.  3. History of congestive heart failure and diastolic dysfunction.  Normal     Cardiolite in March of 2002.  Normal echocardiogram in December of 2003.  4. Elevated troponin with normal CK.  Presumed secondary to diastolic     dysfunction plus or minus hypoxia from chronic obstructive pulmonary     disease exacerbation.  5. Diabetes.  6. Hypertension.  7. High cholesterol.  8. Panic attack/depression.  9. Morbid obesity.   DISCHARGE MEDICATIONS:  1. Altace 10 mg one daily.  2. Lasix 20 mg one daily.  3. Glucophage 500 mg one every 12 hours.  4. ______ 2 mg one every 12 hours.  5. Lipitor 20 mg daily.  6. Lopid 600 mg one every 12 hours.  7. Toprol 50 mg one daily.  8. Effexor 150 mg daily.  9. Zyprexa 2.5 mg daily.  10.      Zantac 150 mg one every 12 hours.  11.      Flovent 250/50 mg one inhalation every 12 hours.  12.      Combivent two puffs every six hours as needed.   DIET:  The patient is asked to maintain a low-fat, low-cholesterol diet.   SPECIAL INSTRUCTIONS:  The patient is strongly encouraged to stop smoking.   FOLLOW-UP:  The patient is asked to call her primary M.D., [name],  M.D., at [facility] for follow-up appointment and evaluation.   CONSULTANTS:  None.   STUDIES:  The patient had a chest x-ray which showed cardiomegaly, chronic  bronchitic interstitial  changes, and no pneumonia.  She had blood cultures  which were negative.  Troponin I 0.2.  Hemoglobin 12.  CK 66.  LFTs were  normal.  ABG on admission with pH 7.43, pCO2 41, and pO2 44.3.  BMET within  normal limits.  BNP of 115 and repeat 227.  CK 66, 67, and 81 with negative  index.   HISTORY OF PRESENT ILLNESS:  This 60 year old white female with multiple  medical problems presented to the ED for complaints of a three-day history  of shortness of breath and a productive cough associated with dyspnea on  exertion.  The patient had continuous symptoms despite increased inhaler  use.  She denies any orthopnea or PND,  but noticed some mild ankle edema.  In the ED, the patient was evaluated and was found to be suffering from COPD  exacerbation and admission was deemed necessary.  Please see the dictated  H&P for further details of the history of present illness.   PAST MEDICAL HISTORY:  As stated above.   ALLERGIES:  No known drug allergies.   MEDICATIONS:  Please see the medication list.   HOSPITAL COURSE:  #1 - CHRONIC OBSTRUCTIVE PULMONARY DISEASE EXACERBATION:  The patient was admitted to a medicine floor bed where she was treated in  the usual fashion with IV antibiotics, steroids, O2, and frequent nebulized  treatments.  The patient's COPD exacerbation resolved slowly.  The patient  was gradually advanced off of O2.  Because of significant hypoxia on  admission, it was felt that the patient may need home O2.  However, prior to  discharge, the patient's ambulatory pulse oximetry remained 92-93%.  The  patient was able to ambulate without significant dyspnea or distress.  The  patient was initially treated with Tequin, however, this was presumed to be  the cause for the exacerbation of her hyperglycemia.  She was ultimately  converted to azithromycin, which she will continue at home.  The patient was  asked to stop smoking.  She was asked to follow up with her primary M.D. for   further evaluation and treatment.   #2 - ELEVATED TROPONIN I WITH NEGATIVE CREATININE KINASE:  The patient has a  history of diastolic CHF.  An echocardiogram in December showed an EF of  55%.  The patient has had negative stress test in the past.  Presumably the  patient's elevation of troponin was secondary to hypoxia and a diastolic  dysfunction.  As the patient has preserved LV function, we will continue  with medical treatment.  No further studies at this time.  In the interim,  the patient was started with fluid dose Lovenox.  However, after the old  records were reviewed, it was found that the patient had cardiac evaluation  in the past and no further testing was ordered.   #3 - DIABETES:  During this hospitalization, the patient had decontrol of  her sugars secondary to steroids, possibly quinolone effect.  The patient  required IV insulin on two occasions to resume normal glycemic control.  At  home, the patient has never been on insulin before.  It is felt that her  decontrol was definitely secondary to steroids and quinolone.  The patient  will continue her home oral hypoglycemics and will follow up with her  primary M.D.   #4 - OTHER MEDICAL PROBLEMS:  The patient has several other chronic medical  problems, which are stable, and the patient is asked to continue all of her  baseline medications.                                               Deirdre Peer. Polite, M.D.    RDP/MEDQ  D:  04/19/2002  T:  04/19/2002  Job:  [id number]

## 2010-07-09 NOTE — Progress Notes (Signed)
[facility]                          PERIPHERAL VASCULAR OFFICE NOTE   NAME:OWENSToshiye, Kever                        MRN:          [id number]  DATE:11/24/2005                            DOB:          [birth year]    PRIMARY CARE PHYSICIAN:  Dr. [name].   REASON FOR CONSULTATION:  The patient referred by Dr. Antoine Poche for right leg  claudication.   HISTORY OF PRESENT ILLNESS:  Ms. Dysart is a 60 year old woman with  atherosclerotic coronary disease in the setting of diabetes mellitus and  hypertension.  She describes 2 years buttock and calf discomfort with  minimal exertion.  She says she develops squeezing pain in her buttocks and  calves and then her legs just give out.  This occurs regularly at less  than 25 yards of walking on level ground.  She has never had any rest pain  or tissue loss.  Symptoms are roughly equal in both legs.   PAST MEDICAL HISTORY:  1. Coronary artery disease with chronic occlusion of the right coronary      artery.  2. Diabetes mellitus diagnosed in 1993.  3. Hypertension.  4. Chronic anemia treated with erythropoietin.  Evaluation has      demonstrated this to be iron deficiency anemia.  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  Dr.      Marina Goodell has felt that her anemia is out of proportion to the blood loss      from this.  5. Obesity.  6. Moderate mitral regurgitation.  7. History of congestive heart failure with preserved systolic function.  8. Status post left carotid endarterectomy by Dr. Madilyn Fireman.  9. Three prior C-sections.  10.Status post cholecystectomy.  11.Status post tonsillectomy.   ALLERGIES:  No known drug allergies.   MEDICATIONS:  1. Zyprexa 2.5 mg per day.  2. Toprol XL 25 mg per day.  3. Procrit 30,000 units every 3 weeks.  4. Glucophage 500 mg once per day.  5. Actos 15 mg per day.  6. Teveten 300 mg per day.  7. Feosol 325 mg twice per day.  8.  Lopid 600 mg twice per day.  9. Pravachol 40 mg per day.  10.Lasix 80 mg per day.  11.Zoloft 100 mg per day.  12.Altace 10 mg per day.  13.Aspirin 325 mg per day.   SOCIAL HISTORY:  The patient is married and accompanied by her husband  today.  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  She continues to smoke  at least a pack per day of cigarettes.  Denies alcohol and illicit drug use.   FAMILY HISTORY:  Father died at 32 of a pneumonia.  Mother died at 74 of  myocardial infarction.  Brother died of bladder cancer at 62.  Brother and  sister alive in their early 21s with hypertension and diabetes.   REVIEW OF SYSTEMS:  CONSTITUTIONAL:  Remarkable for occasional dizziness.  She wears glasses.  CARDIOPULMONARY:  She describes chronic exertional  dyspnea, not different of late.  GI:  Occasional reflux symptoms.  Review of  systems is otherwise negative in detail except as above.   PHYSICAL EXAMINATION:  GENERAL:  She is generally well-appearing in no  distress.  VITAL SIGNS:  Heart rate 76, blood pressure 110/72, on the right, 112/68.  Height 4 feet 10 inches tall, weight 182 pounds.  NECK:  No jugular venous distention and no thyromegaly.  LUNGS:  Clear to auscultation.  Respiratory effort is normal.  SKIN:  Normal.  MUSCULOSKELETAL:  Normal.  HEENT:  Normal with the exception of poor dentition.  CARDIAC:  She has a nondisplaced point of maximal cardiac impulse.  Regular  rate and rhythm without murmurs, rubs or gallops.  ABDOMEN:  Soft, nondistended, nontender.  There is no hepatosplenomegaly and  no midline pulsatile mass.  There are normal bowel sounds and no bruits.  EXTREMITIES:  Warm without clubbing, cyanosis, edema or ulceration.  Carotid  pulses 2+ bilaterally without bruit.  Radial pulses 2+ bilaterally.  Right  femoral pulses 1+ with popliteal, DP and PT not palpable.  On the left, the  femoral, popliteal, DP and PT pulses are not palpable.  There is a very soft  left  femoral bruit.  NEUROLOGIC:  Alert and oriented x3 with normal affect.  Cranial nerves 2-12  grossly intact.  Strength and sensation normal in all four extremities.   LABORATORY DATA AND X-RAY FINDINGS:  Lower extremity Duplex examination  performed on October 21, 2005, demonstrated an ABI of 0.73 on the right and  0.45 on the left.  She had biphasic flow on the right common femoral and  diffuse moderate plaque throughout right common femoral, superficial femoral  and popliteal.  On the left, there is monophasic flow in the common femoral  and diffuse moderate, but nonobstructive plaque below.   IMPRESSION/RECOMMENDATIONS:  1. Left leg claudication.  The patient appears to have severe left iliac      stenosis versus occlusion.  This is clearly producing lifestyle      limiting claudication.  Discussed management options including      continued conservative management, angiography with percutaneous      revascularization and surgical revascularization options.  The patient      opts for angiography with percutaneous revascularization.  Will      schedule this for the near future per her request.  2. Tobacco abuse.  Complete cessation strongly advised.  3. Congestive heart failure with preserved left ventricular systolic      function.  4. Hypertension.  5. Obesity.  6. Coronary artery disease.       Salvadore Farber, MD      WED/MedQ  DD:  11/24/2005  DT:  11/27/2005  Job #:  [id number]   cc:   Rollene Rotunda, MD, [facility]  [name], M.D.

## 2010-07-09 NOTE — Consult Note (Signed)
[place]  Patient:    [name], [name]                       MRN: [id number] Proc. Date: 04/22/00 Adm. Date:  04/22/00 Attending:  Alvia Grove., M.D. CC:         Lacretia Leigh. Quintella Reichert, M.D.   Consultation Report  BRIEF HISTORY:  [name] is a 60 year old female with a history of hypertension, hypercholesterolemia, and cigarette smoking.  She is admitted with 1 week of progressive dyspnea and dyspnea on exertion.  The patient has been given a sedative and the history is quite limited.  She has been very limited over the past week or so because of severe dyspnea on exertion and chest pain.  She states that she is not able to do any sort of activity at all before she has to rest.  She has also had some ankle edema over the past week or so.  CURRENT MEDICATIONS: 1. Amaryl once a day. 2. Lotensin 20 mg a day. 3. Lasix 20 mg twice a day. 4. Celexa 40 mg a day. 5. Wellbutrin 150 mg a day. 6. She is started today on Toprol 50 mg a day.  PAST MEDICAL HISTORY: 1. Non-insulin dependent diabetes mellitus. 2. Congestive heart failure. 3. Hypertension. 4. Hiatal hernia.  SOCIAL HISTORY:  The patient smokes.  She does not get any regular exercise.  REVIEW OF SYSTEMS:  As per history of present illness.  She is too lethargic to give any significant review of systems.  She denies any problems with her eyes, ears, nose and throat.  She denies any GI troubles.  She denies any cough or sputum production.  PHYSICAL EXAMINATION:  GENERAL:  She is a middle aged white female in no acute distress.  She is quite somnolent.  VITAL SIGNS:  Her blood pressure is 122/40 with a heart rate of 97.  HEENT:  Reveals 2+ carotids.  She has no JVD.  There is no carotid bruits.  LUNGS:  Reveals a few basilar rales.  HEART:  Regular rate, S1, S2.  ABDOMEN:   Reveals good bowel sounds.  It is nontender.  EXTREMITIES:  Shows no clubbing, cyanosis, or edema.   She has trace pulses. Her calves are nontender.  NEUROLOGIC:  She is quite somnolent.  Her EKG reveals poor R wave progression.  She has got ST segment depression in the inferior and inferolateral leads.  CARDIAC ENZYMES:  Her troponin on April 18, 2000, was 0.08.  Her troponin this morning is 0.02.  Her CPK is 35 with an MB of 0.5.  Her spiral CT was negative for pulmonary embolus.  She does have some ground glass appearance consistent with either pneumonia or congestive heart failure.  Her white blood cell count is 13.6 with a hemoglobin of 10.3, and a platelet count of 369.  Her creatinine is 0.4.  Sodium is 138, potassium is 3.6, chloride is 103.  BUN is 9.  The patient presents with a week of worsening dyspnea.  She has ankle edema. Her EKG changes are somewhat worrisome for coronary artery disease.  Her enzymes are negative.  She has bilateral infiltrates and has an elevated white blood cell count which is consistent with a pneumonia.  Because of her EKG changes we will continue to consider coronary artery disease and congestive heart failure as a possible etiology.  We will collect cardiac enzymes. We will schedule her for a  possible heart catheterization versus a cardiolite on Monday.  Further plans and recommendations as per Dr. Quintella Reichert. DD:  04/22/00 TD:  04/24/00 Job: [id number]

## 2010-07-09 NOTE — Cardiovascular Report (Signed)
NAME:  [name], [name] NO.:  [id number]   MEDICAL RECORD NO.:  [id number]          PATIENT TYPE:  INP   LOCATION:  4736                         FACILITY:  [facility]   PHYSICIAN:  Rollene Rotunda, M.D.   DATE OF BIRTH:  02-04-1951   DATE OF PROCEDURE:  08/01/2005  DATE OF DISCHARGE:                              CARDIAC CATHETERIZATION   PRIMARY CARE PHYSICIAN:  [name], N.P. with [facility].   PROCEDURES:  Left heart catheterization and coronary arteriography.   INDICATIONS:  Evaluate patient with a non-Q-wave myocardial infarction.  She  has multiple cardiovascular risk factors.   PROCEDURE NOTE:  Left heart catheterization was initially attempted via the  right femoral artery.  However, we could not cannulate the artery despite  use of a Smart needle.  Subsequently turned to the left femoral artery which  was cannulated with a Smart needle.  A #6-French arterial sheath was  inserted via the modified Seldinger technique.  Preformed Judkins and a  pigtail catheter were used. The patient did have distal aortogram obtained  because of some initial difficulty advancing the guidewire, and a Wholly  catheter was utilized.   RESULTS:   HEMODYNAMICS:  LV 165/24, AO 165/109.   CORONARIES:  Left main had long 50% stenosis with calcium. This extended  into the ostial LAD.  This was a large vessel wrapping the apex.  There was  ostial 60% stenosis in the LAD.   The circumflex in the A-V groove was small with diffuse mild disease.   The ramus intermediate was very large and branching.  There was a 30%  stenosis.  The superior branch had 40% stenosis.   There was a mid obtuse marginal which was moderate size with luminal  irregularities.   The circumflex was a dominant vessel.  It was occluded in the mid segment.  There were left-to-right collaterals with scant refill of the distal vessel.   LEFT VENTRICULOGRAM:  The left ventriculogram was obtained in  the RAO  projection.  The EF was approximately 55% with mild global hypokinesis.   DISTAL AORTOGRAM:  The distal aortogram was obtained demonstrating no high-  grade obstructive lesions either in the renals or the iliacs.  However,  there was moderate diffuse distal aortic disease, and the vessel tapered  slightly before the bifurcation.   CONCLUSION:  1.  High-grade single-vessel coronary artery disease.  2.  Nonobstructive ostial left anterior descending disease calcified      segment.  3.  Well preserved ejection fraction.   PLAN:  At this point, the patient would not be a good candidate for  percutaneous revascularization because of the location of the LAD.  It does  not appear to be obstructive.  The symptoms with her elevated troponin are  most likely related to her anemia and occluded right coronary. The patient  will be managed medically.           ______________________________  Rollene Rotunda, M.D.     JH/MEDQ  D:  08/01/2005  T:  08/01/2005  Job:  [id number]

## 2010-07-09 NOTE — Assessment & Plan Note (Signed)
[facility]                              CARDIOLOGY OFFICE NOTE   NAME:OWENSLonia, [name]                        MRN:          [id number]  DATE:10/10/2005                            DOB:          06-20-1950    PRIMARY CARE PHYSICIAN:  [name].   REASON FOR PRESENTATION:  We have a patient with coronary disease and heart  failure.   HISTORY OF PRESENT ILLNESS:  Patient is a pleasant 60 year old with coronary  disease as described below.  She was recently hospitalized with volume  overload and renal insufficiency.  She has had her medications adjusted  which temporarily included holding her ACE and ARB.  However, these seemed  to have been titrated up since her initial hospitalization in late June.   She now returns for follow-up.  She says she is having problems with anemia.  She is having [place] following and getting injections.  She is having her labs  drawn frequently, apparently to include evaluation of her renal function.  She is having no new shortness of breath.  Denies any PND or orthopnea.  She  has had no chest pain. She gets leg pain with ambulation.   PAST MEDICAL HISTORY:  1. Congestive heart failure with a well-preserved ejection fraction (65%).  2. Moderate mitral regurgitation.  3. Hypertension.  4. Chronic anemia.  5. Tobacco use.  6. Coronary artery disease (50% left main stenosis, ostial 60% LAD      stenosis, 30% ramus intermediate stenosis, occluded right coronary      artery with left-to-right collaterals).  7. Obesity.   ALLERGIES:  NO KNOWN DRUG ALLERGIES.   MEDICATIONS:  1. Lasix 80 mg a day.  2. Potassium 10 mEq a day.  3. Toprol 25 mg a day.  4. Travatan 300 mg a day.  5. Iron.  6. Pravachol 80 mg nightly.  7. Lopid 600 mg b.i.d.  8. Zyprexa 2.5 mg a day.  9. Zoloft 100 mg a day.  10.Actos 15 mg a day.  11.Metformin 500 mg a day.  12.Altace 10 mg daily.   REVIEW OF SYSTEMS:  As stated in the HPI and  otherwise negative for other  systems.   PHYSICAL EXAMINATION:  GENERAL APPEARANCE:  Patient is in no distress.  VITAL SIGNS:  Blood pressure 112/64, heart rate 79 and regular, weight 177  pounds.  NECK:  No jugular venous distension 45 degrees, carotid upstroke brisk and  symmetric, left carotid endarterectomy scar, bilateral bruits.  BACK:  No costovertebral angle tenderness.  LUNGS:  Clear to auscultation bilaterally.  CARDIOVASCULAR:  PMI not displaced or sustained, S1 and S2 within normal  limits, no S3, no S4, no murmurs.  ABDOMEN:  Obese, positive bowel sounds, normal in frequency and pitch, no  bruits, no rebound, no guarding, no midline pulsatile mass, no organomegaly.  SKIN:  No rashes, no nodules.  EXTREMITIES:  There are 2+ upper pulses, 2+ femorals with left femoral  bruit, absent dorsalis pedis and posterior tibialis bilaterally.  No  clubbing, cyanosis, or edema.  NEUROLOGIC:  Grossly intact throughout.  EKG:  Sinus rhythm, right axis deviation, premature ventricular contraction,  RSR prime in V1 and V2, no acute STT wave changes.   ASSESSMENT/PLAN:  1. Diastolic heart failure.  Patient under stands volume and salt issues.      At this point, she is euvolemic and no further cardiovascular testing      is suggested.  Of note, she does have renal insufficiency and      apparently this is followed very closely by [name] and I will      defer any lab draws.  2. Coronary disease.  She will continue with secondary risk reduction.      She is finally paying attention to diet.  She needs an LDL given the      extent of her disease in the 31s and HDL in the 50s per her primary      caregiver.  3. Claudication.  Patient has lower extremity pain with decreased pulses.      I will go ahead and get ABIs.   FOLLOW UP:  I would like to see her back in six months or sooner if needed.                               Rollene Rotunda, MD, [facility]    JH/MedQ  DD:  10/10/2005   DT:  10/10/2005  Job #:  [id number]   cc:   [name]

## 2010-07-09 NOTE — Discharge Summary (Signed)
[name], [name]                 ACCOUNT NO.:  [id number]   MEDICAL RECORD NO.:  [id number]          PATIENT TYPE:  INP   LOCATION:  6734                         FACILITY:  [facility]   PHYSICIAN:  [name], M.D.DATE OF BIRTH:  1951-01-09   DATE OF ADMISSION:  12/10/2005  DATE OF DISCHARGE:  12/12/2005                               DISCHARGE SUMMARY   PRIMARY CARE PHYSICIAN:  [facility]   FINAL DIAGNOSES:  1. 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  2. Blood loss anemia.   SECONDARY DIAGNOSES:  1. Diastolic heart failure, stable.  2. Mitral regurgitation.  3. Hypertension.  4. Chronic anemia.  5. Tobacco abuse.  6. Coronary artery disease.  7. Obesity.  8. Diverticulitis.   PROCEDURES:  1. CT scan of the abdomen showed no evidence of retroperitoneal      hematoma or intraperitoneal hemorrhage.  There was scarring noted      in the mid right kidney, which was also previously noted.      Interstitial lung disease in the lung bases.  2. CT scan of the pelvis showed resolving hematoma in the puncture      site right femoral region.  There was no evidence of      retroperitoneal hematoma or intraperitoneal hemorrhage.   BRIEF HISTORY:  [name] is a 60 year old Caucasian female, who has  multiple medical problems.  She has a history of chronic anemia, for  which she has undergone multiple evaluations in the past.  She has  coronary artery disease and intermittent claudication.  She underwent  peripheral vascular catheterization for this prior to admission and a  stent was placed.  Post stenting, she was started on Plavix.  Then, she  began to notice pitch-black gummy stools.  This continued to the point  that the patient started experiencing lightheadedness.  She then came to  the emergency room and was found to have a hemoglobin of 7.6.  Hemoglobin previously was 10.7 on December 05, 2005.  That was prior to  the initiation  of Plavix.  Hence, the patient was admitted for further  evaluation and stabilization.  The patient had no complication from the  peripheral vascular catheterization.  There was no obvious hematoma in  the groin.  She denied chest pain or shortness of breath.  Please see  admission H&P for full details.   HOSPITAL COURSE:  GI bleed.  Likely secondary to AVMs.  The patient has  a history of chronic anemia and has undergone multiple evaluations in  the past.  It was felt that this was unnecessary at this admission, as  suspicion was raised for possible bleeding from AVM on the background of  Plavix.  Discussion was held with cardiologist, Dr. Lewayne [name] and Dr.  Samule [name], and they both agreed on discontinuation of Plavix.  The patient  was transfused with a total of 4 units packed red blood cells.  Hemoglobin and hematocrit on discharge were 10.7 and 31.8, respectively.  The patient's stool continues to improve in color.  She was  hemodynamically stable and she was felt okay for discharge.  To follow  up with Dr. Marina [name] in the outpatient setting.   All other chronic medical conditions were stable and she was continued  on her previous home medications, except for the Plavix.   DISCHARGE MEDICATIONS:  1. Lasix 80 mg daily.  2. Altace 10 mg daily.  3. Potassium 20 mEq 1/2 tablet daily.  4. Eprosartan 60 mg 1/2 tablet daily.  5. Ferrous sulfate 325 mg daily.  6. Gemfibrozil 600 mg p.o. daily.  7. Sertraline 100 mg daily.  8. Pioglitazone 15 mg daily.  9. Metformin 1000 mg 1/2 tablet daily.  10.Olanzapine 2.5 mg daily.  11.Toprol-XL 15 mg 1/2 tablet daily.  12.Pravastatin 40 mg q.h.s.  13.Procrit 10,000 units weekly.   DISCHARGE LABORATORY DATA:  Hemoglobin 10.7, hematocrit 31.8, sodium  140, potassium 4.1, chloride 109, CO2 24, glucose 96, BUN 26, creatinine  0.5, calcium 8.4.   CONDITION ON DISCHARGE:  Stable.   DISCHARGE VITALS:  Temperature 97.7, respiration rate 20, pulse  87,  blood pressure 130/53, O2 sats 100% on room air.      [name], M.D.  Electronically Signed     MBB/MEDQ  D:  01/22/2006  T:  01/23/2006  Job:  [id number]

## 2010-07-09 NOTE — Discharge Summary (Signed)
NAME:  [name], [name]                           ACCOUNT NO.:  [id number]   MEDICAL RECORD NO.:  [id number]                   PATIENT TYPE:  INP   LOCATION:  3018                                 FACILITY:  [facility]   PHYSICIAN:  Asencion Partridge, M.D.                  DATE OF BIRTH:  Jul 07, 1950   DATE OF ADMISSION:  02/05/2002  DATE OF DISCHARGE:  02/08/2002                                 DISCHARGE SUMMARY   DISCHARGE DIAGNOSES:  1. Chronic obstructive pulmonary disease.  2. Diabetes mellitus, type 2.  3. Hypertension.  4. Congestive heart failure.  5. Hypercholesterolemia.  6. Anemia.  7. Tobacco abuse.  8. History of depression.   DISCHARGE MEDICATIONS:  1. Prednisone 60 mg x2 days, then 40 mg x4 days, then 20 mg x3 days.  2. Albuterol two puffs q.6h. until follow-up.  3. Effexor 150 mg p.o. q.d.  4. Altace 10 mg b.i.d.  5. Lipitor 20 mg daily.  6. Zyprexa 2.5 mg p.o. daily.  7. Lasix 20 mg b.i.d.  8. Aspirin 325 mg p.o. daily.  9. Lopid 600 mg b.i.d.  10.      Amaryl 1 mg p.o. daily.  11.      Glucophage 1000 mg b.i.d.   DIET:  This includes diabetic diet.   SPECIAL INSTRUCTIONS:  The patient is to continue to use inhaler until  follow-up appointment.   FOLLOW UP:  The patient is to follow up with [facility] in approximately  one week.  The patient is to call for follow-up appointment.   BRIEF HISTORY AND PHYSICAL:  This is a 60 year old white female with history  of noninsulin dependent diabetes mellitus, hypertension, increased  cholesterol, and tobacco abuse, who presents with productive cough and fever  x3 days.  The patient was seen at [facility] today and was flu positive  and noted to be hypoxic, approximately 77% on room air, 92% on 3 L of O2.  The patient was given albuterol and Depo-Medrol up to 120 mg IV and sent to  the ED.  The patient had a cough productive of thick, yellow-green sputum,  positive shortness of breath, no orthopnea, no PND, no lower  extremity  edema, no calf pain.  The patient has noted some hot sweats, fevers, and  chills for three days.  T-max was 103.2 at [employer].  The  patient has continuously maintained normal p.o. intake and no nausea,  vomiting, or diarrhea.    HOSPITAL COURSE:  1. COPD:  The patient has a long history of tobacco abuse and likely hypoxia     secondary to viral bronchitis with influenza positive contributing to     COPD exacerbation.  The patient required increased O2 supplementation,     approximately 3 L to keep O2 saturations greater than 92%.  The patient     was started on albuterol/Atrovent nebulizer and  continued on prednisone     taper.  She did not start any antibiotics and so there was no infiltrate     on chest x-ray shown.  The patient also was noted to be influenza     positive.  The patient continued to show some improvement in level of     hypoxia throughout hospital course.  The patient was switched over from     albuterol/Atrovent nebulizers to MDIs prior to discharge.  The patient     was continued on albuterol MDI to be used until follow-up with primary     care doctor.  The patient was also encouraged to quit smoking as this was     the likely underlying cause of her COPD.  The patient at the time of     discharge was on room air and was saturating 95-96% prior to discharge     and had only occasional very mild wheezing.  The patient had no elevation     in white count, therefore, likely could be viral, more likely to be viral     bronchitis as her underlying COPD exacerbation.  2. DIABETES MELLITUS, TYPE 2:  The patient has a long history of noninsulin     dependent diabetes.  The patient is an obese female on Amaryl and     Glucophage.  At admission the patient had Amaryl continued and Glucophage     was held secondary to possibility of procedures.  The patient had     increased CBGs secondary to steroid burst tapers.  The patient was     covered with  sliding scale insulin.  The patient was already on aspirin     and ACE inhibitor for her chronic diabetes.  The patient was restarted on     her home medications prior to discharge.  Hemoglobin A1c was performed     and A1c was found to be 7.6.  3. HYPERTENSION:  The patient is currently on Altace for hypertension.  At     time of admission the patient's blood pressure was 108/48.  The patient     was continued on Altace throughout hospital course and discharged on the     same medication.  The patient's blood pressure at time of discharge was     159/77.  The patient may need likely further blood pressure medications     in the future in order to control blood pressures.  4. CONGESTIVE HEART FAILURE:  Question of congestive heart failure with     __________.  The patient had some peribronchial thickening and     cardiomegaly on chest x-ray.  The patient also had echo that showed EF of     55-65%.  No focal regional wall abnormalities.  The patient was continued     on Lasix b.i.d. at his home dosing for adequate diuresis.  The patient     had some lower extremity edema.  She was also complaining of chronic     underlying shortness of breath with some three pillow orthopnea and the     patient had some wet sounds in exam with some crackles bibasilarly.     Continued gentle diuresis in the face of patient hypotension and the     patient was discharged on reduced dose of Lasix secondary to continued     lower extremity edema.  5. HYPERCHOLESTEROLEMIA:  The patient has a long history of     hypercholesterolemia for which she was on  Lipitor and Lopid.  The patient     had a cholesterol panel that was performed in the hospital that revealed     total cholesterol of 206, triglycerides of 224, HDL 33, and an LDL of     161.  The patient is to continue Lipitor and Lopid with possible     titration up on the patient's dose of Lipitor per her primary care     doctor. 6. TOBACCO ABUSE:  The patient  has been encouraged for smoking cessation and     the patient was given materials and educational resources.  The patient     continues to try to quit cold [place] at this point in time but is having     no success and does not really desire to quit fully at this point in     time.  7. HISTORY OF ANEMIA:  The patient's hemoglobin at the time of admission was     12.4.  The patient was monitored for further drops in her hemoglobin but     remained stable throughout the hospital course.  The patient had her iron     sulfate discontinued throughout the hospital course and on discharge.   LABORATORY DATA:  Discharge labs include white blood count of 8.7,  hemoglobin of 12.9, hematocrit of 38.6, and platelet count 276.   DISPOSITION:  The patient was discharged on February 08, 2002 in stable and  improved condition and will follow up with [facility] for follow-up in  approximately one week.  The patient is to call for appointment.    Alvira Philips, M.D.                      Asencion Partridge, M.D.   RM/MEDQ  D:  05/15/2002  T:  05/15/2002  Job:  [id number]

## 2010-07-09 NOTE — Assessment & Plan Note (Signed)
[facility]                           GASTROENTEROLOGY OFFICE NOTE   NAME:[name]                        MRN:          [id number]  DATE:12/06/2005                            DOB:          1950/06/22    REASON FOR CONSULTATION:  Iron-deficiency anemia.   HISTORY:  [name] is a 60 year old white female with multiple medical problems  who is well-known to this practice. Her medical problems include: Diabetes  mellitus, hypertension, morbid obesity, hyperlipidemia, tobacco abuse,  peripheral vascular disease, coronary artery disease with prior myocardial  infarction and heart failure, and chronic iron-deficiency anemia. For her  iron-deficiency anemia she has undergone extensive GI workup including upper  endoscopy with push enteroscopy in August 2005, capsule endoscopy in August  2005, standard upper endoscopy in August 2005, incomplete colonoscopy in  June 2003. The only abnormalities that she has had is some blood indicator  on the capsule endoscopy suggesting vascular malformations or non-steroidal  anti-inflammatory drug-induced erosions. She has been in this office now  three times in the past fifteen months regarding her anemia. She has been on  iron 325 mg b.i.d. recently. In July she had been started on Aranesp  injections. She has been evaluated more recently by Dr. Dalene [name]. She has had  bone marrow evaluation revealing low iron due to persistent anemia with her  most recent hemoglobin 8.7. Her Aranesp therapy has been increased and one  dose of IV iron provided approximately two weeks ago in addition to one unit  of packed red blood cells. Followup CBC recently October 31, 2005 was  unchanged with a hemoglobin of 8.5. Her MCV was normal at 89.4. The patient  denies melena or hematochezia. No abdominal complaints. Prior Hemoccult  testing for similar problems has been negative.   CURRENT MEDICATIONS:  Include Lasix, potassium chloride,  Toprol XL, Teveten,  iron, Pravachol, Lopid, Zyprexa, Zoloft, Actos, metformin, Altace, aspirin,  and Procrit shots.   PHYSICAL EXAMINATION:  Pleasant, well-appearing female in no acute distress.  Blood pressure: 130/52. Heart rate: 76. Weight: 177.6 pounds.  HEENT: Sclerae anicteric. Conjunctivae are slightly pale. Oral mucosa is  intact. No telangiectasias.  LUNGS:  Clear.  HEART: Is regular.  ABDOMEN: Is obese and soft without tenderness, mass or hernia.   IMPRESSION:  Chronic persistent anemia, presumably iron deficient based on  oncologic workup. Persistent problem despite oral iron and Procrit. More  recently given IV iron. No evidence of significant GI blood loss to explain  such refractory anemia. She has had biopsies of the small bowel in the past  to exclude iron absorptive disorders. Intermittent blood loss from the GI  tract seems to be at the level of the small bowel, possibly AVM's or  erosions. However, her persistent anemia seems well out of proportion to  what might be explained by occult blood loss from the gut. I do not think  she needs additional GI evaluations at this time.   RECOMMENDATIONS:  1. No further GI evaluations as discussed above.  2. Increase oral iron to three times daily.  3. Iron infusions per Dr. Dalene [name]  to correct iron deficiency. I would      expect that if she truly has otherwise normal bone marrow that she      should have excellent response to iron infusion therapy. Otherwise, she      should continue to receive red blood cell transfusions on a p.r.n.      basis to address any symptoms as well as look for other causes for      anemia should she not respond as we might expect.            ______________________________  [name]. Eda Keys., MD      JNP/MedQ  DD:  12/06/2005  DT:  12/07/2005  Job #:  [id number]   cc:   [name], M.D.  [name]

## 2010-07-09 NOTE — H&P (Signed)
[facility]  Patient:    [name], [name]                        MRN: [id number] Adm. Date:  [id number] Attending:  Feliciana Rossetti                         History and Physical  CHIEF COMPLAINT: Shortness of breath.  HISTORY OF PRESENT ILLNESS: This patient is a 60 year old white female who was sitting at home when she became acutely short of breath with mild chest pressure and mild nausea, but no associated arm pain, jaw pain, diaphoresis, or ______.  Two days ago she had presented to [employer] with a stereotype event and at that time she left against medical advice without therapy, with positive troponin I, because her mother had died that day.  The patient recently describes bilateral ankle edema.  She denies recent fever, chills, diarrhea, headache, light headedness.  She denies palpitations, chest pain, double vision, blindness, loss of bowel or bladder continence.  She denies rash or skin breakdown.  PHYSICAL EXAMINATION:  VITAL SIGNS: Heart rate 84, temperature 98 degrees, respiratory rate 10, blood pressure 150/100.  GENERAL: No acute distress.  HEENT: PERRL.  Fundi without exudate or hemorrhage.  Normal disc.  Oropharynx without lesions.  Mucous membranes moist.  NECK: Supple, lymph node survey negative.  HEART: Regular rate and rhythm.  No murmurs, rubs, or gallops.  CHEST: Bibasilar crackles, coarse upper airway sounds, diminished air movement, bilateral mild expiratory wheeze.  ABDOMEN: No dullness to percussion, soft, no hepatosplenomegaly.  Obese.  No rebound or guarding.  Normal bowel sounds.  EXTREMITIES: No clubbing or cyanosis.  Bilateral pitting edema to pretibial area without cords or Homans sign.  Distal pulses intact.  No skin breakdown.  NEUROLOGIC: Cranial nerves 2-12 grossly within normal limits.  Muscle strength 5/5 and symmetric.  DTRs 1+ and symmetric.  RECTAL: Tone normal  with no blood in vault.  PAST SURGICAL HISTORY:  1. Cesarean section x 3.  2. Cholecystectomy.  PAST MEDICAL HISTORY:  1. Non-insulin dependent diabetes mellitus.  2. Hypertension.  3. Hiatal hernia.  CURRENT MEDICATIONS:  1. Glucophage 2000 mg q.d.  2. Aspirin 325 mg p.o. q.d.  3. Effexor XR 150 mg p.o. q.d.  4. Valium 10 mg p.o. t.i.d. p.r.n.  5. Lasix 20 mg p.o. b.i.d.  6. Lotensin 20 mg p.o. q.d.  ALLERGIES: No known drug allergies.  SOCIAL HISTORY: The patient is a smoker, nondrinker.  She lives with her husband and children.  FAMILY HISTORY: No early myocardial infarction, cerebrovascular accident, no collagen vascular disease.  REVIEW OF SYSTEMS: No hemoptysis, no cough.  See HPI.  LABORATORY DATA: Troponin I and CK-MBs negative.  Glucose 277.  Hemoglobin 10.3, WBC 13,600.  ASSESSMENT/PLAN:  1. Unstable angina.  Will involve cardiologist for probable cardiac     catheterization.  2. Non-insulin dependent diabetes mellitus.  Will use Amaryl 4 mg p.o. q.d.     and sliding-scale insulin in anticipation of dye load.  3. Tobacco use.  Will place the patient on Wellbutrin. DD:  04/22/00 TD:  04/24/00 Job: [id number]

## 2010-07-09 NOTE — Assessment & Plan Note (Signed)
HEALTHCARE                            CARDIOLOGY OFFICE NOTE   NAME:[name], Landgrebe                        MRN:          [id number]  DATE:03/31/2006                            DOB:          08/25/1950    PRIMARY CARE PHYSICIAN:  Dr. Jacalyn Lefevre.   REASON FOR PRESENTATION:  Evaluate patient with coronary artery disease.   HISTORY OF PRESENT ILLNESS:  Patient is a 60 year old white female with  coronary disease as described below.  Since I last saw her, she has been  seen by Dr. Samule Ohm a couple of times and has had peripheral  revascularization of the lower extremity disease.  She has had much  improvement in the leg pain.  She is able to do her chores and run  errands, such as walking through the grocery store without leg  discomfort.  Unfortunately, she has not taken the opportunity to start  exercising.  She denies any chest discomfort, neck discomfort, arm  discomfort, activity-induced nausea or vomiting, excessive diaphoresis.  She has had no palpitations, presyncope or syncope.  She has no PND or  orthopnea.   PAST MEDICAL HISTORY:  1. Congestive heart failure with a well-preserved ejection fraction      (65%).  2. Moderate mitral regurgitation.  3. Hypertension.  4. Chronic anemia.  5. Tobacco abuse.  6. Coronary artery disease (50% of left main, osteal 60% LAD stenosis,      30% ramus intermediate stenosis, occluded right coronary artery      with left-to-right collaterals).  7. Peripheral vascular disease status post PCI with stenting of her      left lower extremity by Dr. Samule Ohm.  8. Obesity.   ALLERGIES:  No known drug allergies.   MEDICATIONS:  1. Lasix 80 mg daily.  2. Potassium 10 mEq daily.  3. Toprol 25 mg daily.  4. Teveten 300 mg daily.  5. Iron.  6. Pravachol 80 mg nightly.  7. Lopid 600 mg b.i.d.  8. Zyprexa 2.5 mg daily.  9. Zoloft 100 mg daily.  10.Actos 15 mg daily.  11.Metformin 1000 mg daily.  12.Altace  10 mg daily.  13.Aspirin 325 mg daily.   REVIEW OF SYSTEMS:  As stated in the HPI, otherwise negative for other  systems.   PHYSICAL EXAMINATION:  Patient is in no distress.  Blood pressure 138/63, heart rate 67 and regular, weight 178 pounds,  body mass index 37.  NECK:  No jugular venous distension at 45 degrees, carotid upstrokes  brisk and symmetric, bilateral carotid bruits, no thyromegaly.  LYMPHATICS:  No cervical, axillary, inguinal adenopathy.  LUNGS: Clear to auscultation bilaterally.  BACK:  No costovertebral angle tenderness.  CHEST:  Unremarkable.  HEART:  PMI not displaced or sustained.  S1 and S2 within normal limits,  no S3, no S4.  No clicks, no rubs, 2/6 apical early peaking systolic  murmur, no diastolic murmurs.  ABDOMEN:  Obese, positive bowel sounds, normal in frequency, pitch, no  bruits, no rebound, no guarding, no midline pulsatile mass, no  hepatomegaly, no splenomegaly.  SKIN:  No rashes, no  nodules.  EXTREMITIES:  2+ upper pulses, absent posterior tibialis and dorsalis  pedis bilaterally, no cyanosis, no clubbing, no edema.  NEURO:  Oriented to person, place, and time.  Cranial nerves II-XII  grossly intact, motor grossly intact.   EKG:  Sinus rhythm, rate 67, axis rightward,  intervals within normal  limits, no acute ST-T wave changes, no change from previous EKGs.   ASSESSMENT/PLAN:  1. Coronary disease, patient is having no on-going symptoms.  No      further cardiovascular testing is suggested.  She is going to have      her lipids and diabetes followed by Dr. Hyacinth Meeker, and reports that      she did have lipids recently.  The only thing I encouraged her      today is that she needs to walk, and I told her 20 minutes 5 days a      week, otherwise she will remain on the medications as listed.  2. Dyslipidemia.  The goals are an LDL less than 70 and an HDL greater      than 50.  Again, I will defer to Dr. Hyacinth Meeker.  3. Followup.  I will see her back in  1 year or sooner if needed.     Rollene Rotunda, MD, [facility]  Electronically Signed    JH/MedQ  DD: 03/31/2006  DT: 03/31/2006  Job #: [id number]   cc:   [name]. Hyacinth Meeker, M.D.

## 2010-07-09 NOTE — Assessment & Plan Note (Signed)
[facility]                           GASTROENTEROLOGY OFFICE NOTE   NAME:[name] [name]                        MRN:          [id number]  DATE:09/06/2005                            DOB:          1951-02-08    REFERRING PHYSICIAN:  Saul [name], N.P.   REASON FOR CONSULTATION:  Recurrent anemia.   HISTORY:  This is a 60 year old white female well known to me with multiple  medical problems including diabetes mellitus, morbid obesity, hypertension,  hyperlipidemia, tobacco abuse, peripheral vascular disease, and recent  hospitalization for myocardial infarction and heart failure.  At that time,  she was noted to be anemic with a hemoglobin of 7.5.  She has a history of  iron-deficiency anemia and Hemoccult positive stool which has been  extensively evaluated in the past, including colonoscopy, upper endoscopy,  push enteroscopy, and capsule endoscopy.  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  However, it seems that her  anemia has been out of proportion to her occult intermittent GI blood loss.  She has been on iron therapy t.i.d. and was getting CBC's about every three  months.  It is not clear if her hemoglobin drifted or fell acutely.  She has  not had hematology evaluation.  She was recently started on Aranesp  injections.  Her hemoglobin in [employer] office last week was 10.8.  Her MCV was normal at 84.  The patient denies melena or hematochezia.  No  active GI complaints.   ALLERGIES:  No known drug allergies.   CURRENT MEDICATIONS:  1.  Lasix 80 mg daily.  2.  Potassium chloride 20 mEq 1/2 tab daily.  3.  Toprol XL 25 mg daily.  4.  Teveten 300 mg daily.  5.  Iron sulfate 325 mg b.i.d.  6.  Pravachol 80 mg at night.  7.  Lopid 60 mg b.i.d.  8.  Zyprexa 2.5 mg daily.  9.  Zoloft 100 mg daily.  10. Actos 15 mg daily.  11. Metformin 1500 mg daily.  12. Altace 5 mg daily.  13. Procrit injection  therapy weekly for now.   PHYSICAL EXAMINATION:  GENERAL:  A chronically ill-appearing female in no  acute distress.  She is alert and oriented.  VITAL SIGNS:  Blood pressure is 124/76, heart rate 68 and regular, weight is  186.2 pounds.  HEENT:  Sclerae anicteric.  Conjunctivae are pink.  Oral mucosa intact.  LUNGS:  Clear.  HEART:  Regular.  ABDOMEN:  Obese and soft without tenderness, mass, or hernia.  EXTREMITIES:  Trace edema bilaterally.   IMPRESSION:  Chronic anemia in part, probably not entirely due to chronic  gastrointestinal blood loss from previous noted small intestinal vascular  malformations.  Evidence of occult, but no evidence of subacute or acute  bleeding.  Concurrent bone marrow problem should be excluded.   RECOMMENDATIONS:  1.  Continue chronic iron therapy.  2.  Continue Aranesp injection therapy per [name] [name].  3.  Monitor blood counts at least once per month.  I would recommend that  her physicians transfuse her should her hemoglobin drop below 10 in      order to maintain a hemoglobin of at least 10, as the patient tends to      become symptomatic otherwise.  4.  If the patient has recurrent anemia despite the above measures without      obvious significant gastrointestinal bleeding, then hematology input is      warranted.                                   [name] [name]. [name] [name]., MD   JNP/MedQ  DD:  09/06/2005  DT:  09/06/2005  Job #:  [id number]   cc:   [name] [name], N.P.

## 2010-07-09 NOTE — Progress Notes (Signed)
[facility]                          PERIPHERAL VASCULAR OFFICE NOTE   NAME:[name], Yankowski                        MRN:          [id number]  DATE:01/10/2006                            DOB:          [birth year]    PRIMARY CARE PHYSICIAN:  Dr. [name].   HISTORY OF PRESENT ILLNESS:  [name] is a 60 year old lady with  atherosclerotic peripheral arterial disease. She presented with severe left  leg claudication with a left ABI of 0.45 and right of 0.75. I revascularized  her left common and external iliac artery on December 09, 2005. Symptoms  persisted and ABI dropped to 0.35. 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 I placed  an additional stent. Her claudication is now relieved.  ABI has improved to  0.70. She can walk about the grocery store without any pain.   CURRENT MEDICATIONS:  Lasix 80 mg daily, Kay Ciel 10 mEq daily, Toprol XL 25  mg daily, Teveten 300 mg daily, iron sulfate 325 mg b.i.d., Pravachol 80 mg  q.h.s. Lopid 600 mg b.i.d., Zyprexa 2.5 mg daily, Zoloft 100 mg daily, Actos  15 mg daily, metformin 500 mg daily, Altace 10 mg daily, aspirin 325 mg  daily.   PHYSICAL EXAMINATION:  GENERAL:  She is generally well appearing, in no  distress.  VITAL SIGNS:  The heart rate is 74, blood pressure 114/74 and equal  bilaterally. Weight is stable at 179 pounds.  NECK: She has no jugular venous distension, no thyromegaly.  LUNGS: Clear to auscultation. Respiratory effort is normal.  CARDIAC: She has a nondisplaced point of maximal cardiac impulse. There is a  regular rate and rhythm without murmur, rub or gallop.  ABDOMEN: Soft, nondistended, nontender. There is no hepatosplenomegaly.  Bowel sounds are normal.  EXTREMITIES: Warm without clubbing, cyanosis, edema or ulceration. Femoral  pulses 1+ on the left and trace on the right.   IMPRESSION/RECOMMENDATION:  1. Lower  extremity peripheral arterial disease: Doing very nicely after      repeat revascularization on the left.  2. Coronary artery disease managed by Dr. Antoine Poche.  3. Diabetes mellitus.  4. Hypertension.  5. Chronic anemia.  6. Obesity.  7. Mitral regurgitation.  8. Congestive heart failure with preserved left ventricular systolic      function.  9. Atherosclerotic cerebrovascular disease managed by Dr. Madilyn Fireman.   I will see her back in 6 months.     Salvadore Farber, MD  Electronically Signed    WED/MedQ  DD: 01/10/2006  DT: 01/10/2006  Job #: [id number]   cc:   [name], M.D.

## 2010-07-09 NOTE — H&P (Signed)
NAME:  [name], [name]                           ACCOUNT NO.:  [id number]   MEDICAL RECORD NO.:  [id number]                   PATIENT TYPE:  INP   LOCATION:  0445                                 FACILITY:  [facility]   PHYSICIAN:  Malcolm T. Russella Dar, M.D. [facility]          DATE OF BIRTH:  Jul 30, 1950   DATE OF ADMISSION:  10/20/2003  DATE OF DISCHARGE:                                HISTORY & PHYSICAL   CHIEF COMPLAINT:  Weakness.   HISTORY:  [name] is a pleasant 60 year old white female, a primary patient of  Dr. Wendee Copp, known to Dr. Yancey Flemings, with history of hypertension, adult-  onset diabetes mellitus, hyperlipidemia, who has been undergoing evaluation  for anemia and heme-positive stool.  She had a previous colonoscopy done in  2003 which was negative with the exception of some diverticulosis.  She had  seen Dr. Marina Goodell early in August and had undergone upper endoscopy which was  negative for any source of anemia.  Her hemoglobin when checked on September 22, 2003 was 8.1.  She was then set up for capsule endoscopy which was done on  August 25 showing multiple areas in the proximal small bowel where the blood  indicator was positive, and there was blood noted in the lumen; however, no  definite lesions were seen other than a couple of erosions.  At this time,  the patient had gone to her allergist earlier today, was noted to be pale,  and repeat hemoglobin was done which was 7.4.  She was subsequently sent to  the ER for evaluation.  The patient was seen and evaluated in the emergency  room, found hemodynamically stable.  Stool per the ER physician was blackish  with red tinge.  GI was called and she is admitted at this time for  transfusions and further diagnostic evaluation to include small bowel  enteroscopy.  Labs on admission showed a hemoglobin of 7.4, hematocrit of  23.5, WBC of 10.4, platelets 413.   CURRENT MEDICATIONS:  1.  Aspirin 325 p.o. b.i.d.  2.  Amaryl 2 mg b.i.d.  3.   Glucophage 1 g b.i.d.  4.  Lasix 40 mg b.i.d.  5.  Lopid 600 mg b.i.d.  6.  Zyprexa 2.5 daily.  7.  Altace 10 mg b.i.d.  8.  Zoloft 100 mg daily.  9.  Toprol-XL 50 mg b.i.d.   ALLERGIES:  No known drug allergies.   PAST MEDICAL HISTORY:  As outlined above.  She also has history of  depression, obesity, COPD, she is status post C-section x3, cholecystectomy,  and has history of acid reflux.   FAMILY HISTORY:  Negative for colon cancer.  Father known to Dr. Yancey Flemings  with history of eosinophilic gastroenteritis.   SOCIAL HISTORY:  The patient is married.  She is a Futures trader.  She is a  smoker.  No ETOH.   REVIEW OF SYSTEMS:  CARDIOVASCULAR:  Denying chest  pain or anginal symptoms.  PULMONARY:  Pertinent for exertional dyspnea.  GENITOURINARY:  Denying any  dysuria, urgency, or frequency.  GI:  As above.   PHYSICAL EXAMINATION:  GENERAL:  The patient is a well-developed, moderately-  obese white female in no acute distress.  She is alert and oriented x3.  VITAL SIGNS:  Temperature is 98.2, blood pressure 115/59, pulse is 74,  respirations 20.  HEENT:  Lips are somewhat pale.  Conjunctivae are pale.  There is no JVD or  bruit.  PULMONARY:  With scattered rhonchi.  CARDIOVASCULAR:  Regular rate and rhythm with S1 and S2.  No murmur, rub, or  gallop.  ABDOMEN:  Large, soft, bowel sounds are active.  There is no focal  tenderness.  No mass or hepatosplenomegaly.  RECTAL:  Exam was not repeated.  Dark brown to black tinged with blood per  ER physician on day of admission.  EXTREMITIES:  Trace edema bilaterally.  NEUROLOGIC:  Nonfocal.   IMPRESSION:  57.  A 60 year old white female with subacute gastrointestinal bleed with      progressive anemia and positive capsule endoscopy for evidence of      bleeding but no definite lesion identified.  Rule out small bowel      ulcerations secondary to aspirin, arteriovenous malformations,      inflammatory bowel disease, or possible  neoplasm.  2.  Anemia, symptomatic.  3.  Other problems as outlined above.   PLAN:  The patient is admitted to the service of Dr. Claudette Head for IV  fluid hydration, transfusions x2 and then as indicated.  Will schedule upper  endoscopy with small bowel enteroscopy in a.m.  She will be placed on  sliding scale insulin coverage.  For further details, please see the orders.     Amy Esterwood, P.A.-C. [facility]                Malcolm T. Russella Dar, M.D. [facility]    AE/MEDQ  D:  10/21/2003  T:  10/21/2003  Job:  [id number]   cc:   [name], M.D.  [address] [place]  [place] [address]  Fax: [phone]

## 2010-07-09 NOTE — H&P (Signed)
NAME:  [name], [name] NO.:  [id number]   MEDICAL RECORD NO.:  [id number]          PATIENT TYPE:  INP   LOCATION:  NA                           FACILITY:  [facility]   PHYSICIAN:  [name], M.D.    DATE OF BIRTH:  May 26, 1950   DATE OF ADMISSION:  DATE OF DISCHARGE:                                HISTORY & PHYSICAL   CHIEF COMPLAINT:  Left internal carotid artery stenosis.   HISTORY OF PRESENT ILLNESS:  The patient is a pleasant 60 year old white  female who had an episode of right shoulder and arm weakness lasting for  approximately 50 minutes approximately four weeks ago.  She also experienced  left eye blurred vision at the same setting.  The patient was evaluated at  [facility] where she had a CT scan at that time which was negative  for any intracranial pathology.  The patient has been seen by Dr. Madilyn [name]  about 10 years ago with visual changes and moderate stenosis by carotid  duplex.  She underwent formal arteriography at that time which revealed 65%  stenosis in her internal carotid and 50% stenosis in her left internal  carotid artery.  On August 10, 2004, she underwent bilateral carotid duplex  study which revealed 60-79% right internal carotid artery stenosis and 80-  99% left internal carotid artery stenosis.  Peak systolic velocities in the  left of 500 and end-diastolic velocities in the left internal carotid artery  at 191 cm per second.  The patient was then scheduled for an arch aortogram  selective right and left common carotid arteriograms.  This was done on August 23, 2004.  This revealed 40-50% right carotid bifurcation stenosis with 50%  right proximal common carotid artery stenosis.  There was 80-90% proximal  left internal carotid artery stenosis and 40% proximal left common carotid  artery stenosis.  The patient presents today for a history and physical  prior to surgery. She denies any numbness or weakness in her extremities, in  vision, slurred speech, amaurosis fugax, memory loss.  She also denies any  nausea and vomiting, vertigo or dizziness.   PAST MEDICAL HISTORY:  1.  Congestive heart failure.  2.  Cholecystectomy.  3.  Stent in kidney according to patient, assuming renal artery.  The      patient unsure of which side.   ALLERGIES:  NO KNOWN DRUG ALLERGIES.   MEDICATIONS:  1.  Fish oil 1000 mg p.o. daily.  2.  Furosemide 80 mg p.o. daily.  3.  Zyprexa 2.5 mg p.o. daily.  4.  Toprol XL 100 mg p.o. daily.  5.  Metformin one tablet twice a day.  6.  Teveten 600 mg p.o. b.i.d.  7.  Gemfibrozil 600 mg p.o. b.i.d.  8.  Zoloft 100 mg p.o. daily.  9.  Altace 10 mg p.o. b.i.d.  10. Amaryl 2 mg p.o. daily.  11. Actos 15 mg p.o. daily.  12. Cardizem LA 240 mg p.o. daily.  13. Pravachol 80 mg p.o. q.d.   SOCIAL HISTORY:  Uses tobacco one pack per  day over the past 28 hours.  The  patient denies any alcohol use.  Currently retired.  The patient still  driving and resides in [place].   FAMILY HISTORY:  Mother positive for congestive heart failure, myocardial  infarction and hypertension.  Father positive for hypertension and coronary  artery disease.  Brothers and sisters positive for hypertension and diabetes  mellitus.   PHYSICAL EXAMINATION:  GENERAL:  Obese white female in no acute distress.  VITAL SIGNS:  Blood pressure 112/70, heart rate of 70, respirations of 18.  The patient's height is 4 feet 10 inches, weight of 186 pounds.  HEENT:  Normocephalic, atraumatic.  Pupils equal, round, reactive to light  and accommodation.  Extraocular movements intact.  Oral mucosa is pink and  moist.  NECK:  Bilateral carotid bruits noted.  LUNGS:  Respiratory positive crackles bilateral bases.  CARDIAC: A regular rate and rhythm with a 3/6 systolic ejection murmur  noted.  GENITOURINARY:  Deferred.  RECTAL:  Deferred.  EXTREMITIES:  No edema, cyanosis or clubbing noted.  The patient has 2+  bilateral  radial femoral and dorsalis pedis pulses noted.  NEUROLOGICAL:  Cranial nerves II-XII intact.  The patient is alert and  oriented x4.  Gait steady.  Muscle strength is 4/5 noted upper and lower  extremities bilaterally, equally bilaterally.  The patient has 2+ bilateral  deep tendon reflexes.   The patient had an arch aortogram with selective right and left common  carotid arteriogram revealing 40-50% right carotid bifurcation stenosis with  50% right proximal common carotid artery stenosis.  Also showed 80-90%  proximal left internal carotid artery stenosis and 40% proximal left common  carotid artery stenosis.   IMPRESSION AND PLAN:  [name] is a 60 year old female seen with severe  left internal carotid artery stenosis.  The patient has been seen and  evaluated by Dr. Madilyn [name].  He discussed undergoing left carotid  endarterectomy.  She is told if she develops any changes in vision, weakness  or slurred speech, she is to contact the office and present to the emergency  room.  The patient acknowledged her understanding.       KMD/MEDQ  D:  09/03/2004  T:  09/03/2004  Job:  [id number]

## 2010-07-22 ENCOUNTER — Inpatient Hospital Stay (INDEPENDENT_AMBULATORY_CARE_PROVIDER_SITE_OTHER)
Admission: RE | Admit: 2010-07-22 | Discharge: 2010-07-22 | Disposition: A | Discharge status: Home / Self Care | Payer: Self-pay | Source: Ambulatory Visit | Attending: Family Medicine | Admitting: Family Medicine

## 2010-07-22 DIAGNOSIS — N390 Urinary tract infection, site not specified: Secondary | ICD-10-CM

## 2010-07-22 LAB — POCT URINALYSIS DIP (DEVICE)
Ketones, ur: NEGATIVE mg/dL
Protein, ur: 100 mg/dL — AB
Specific Gravity, Urine: 1.015 (ref 1.005–1.030)
Urobilinogen, UA: 0.2 mg/dL (ref 0.0–1.0)
pH: 5 (ref 5.0–8.0)

## 2010-10-26 ENCOUNTER — Encounter: Payer: Self-pay | Admitting: Cardiology

## 2010-11-17 LAB — RAPID STREP SCREEN (MED CTR MEBANE ONLY): Streptococcus, Group A Screen (Direct): NEGATIVE

## 2010-11-19 ENCOUNTER — Ambulatory Visit (INDEPENDENT_AMBULATORY_CARE_PROVIDER_SITE_OTHER): Payer: Self-pay | Admitting: Cardiology

## 2010-11-19 ENCOUNTER — Encounter: Payer: Self-pay | Admitting: Cardiology

## 2010-11-19 DIAGNOSIS — F17200 Nicotine dependence, unspecified, uncomplicated: Secondary | ICD-10-CM

## 2010-11-19 DIAGNOSIS — I10 Essential (primary) hypertension: Secondary | ICD-10-CM

## 2010-11-19 DIAGNOSIS — E785 Hyperlipidemia, unspecified: Secondary | ICD-10-CM

## 2010-11-19 DIAGNOSIS — I2510 Atherosclerotic heart disease of native coronary artery without angina pectoris: Secondary | ICD-10-CM

## 2010-11-19 DIAGNOSIS — I679 Cerebrovascular disease, unspecified: Secondary | ICD-10-CM

## 2010-11-19 DIAGNOSIS — I701 Atherosclerosis of renal artery: Secondary | ICD-10-CM | POA: Insufficient documentation

## 2010-11-19 DIAGNOSIS — E7800 Pure hypercholesterolemia, unspecified: Secondary | ICD-10-CM

## 2010-11-19 DIAGNOSIS — I739 Peripheral vascular disease, unspecified: Secondary | ICD-10-CM | POA: Insufficient documentation

## 2010-11-19 NOTE — Assessment & Plan Note (Signed)
Blood pressure mildly elevated. However she states typically normal. Continue present medications and increase as needed. Potassium and renal function monitored by primary care.

## 2010-11-19 NOTE — Assessment & Plan Note (Signed)
Continue aspirin and statin. Schedule followup carotid Dopplers. 

## 2010-11-19 NOTE — Assessment & Plan Note (Signed)
Patient counseled on discontinuing. 

## 2010-11-19 NOTE — Assessment & Plan Note (Signed)
Continue aspirin, beta blocker and statin. No recent chest pain.

## 2010-11-19 NOTE — Assessment & Plan Note (Signed)
Continue statin. Lipids and liver monitored by primary care. 

## 2010-11-19 NOTE — Assessment & Plan Note (Signed)
Continue aspirin and statin. Followup ABIs with Doppler.

## 2010-11-19 NOTE — Progress Notes (Signed)
HPI: 60 yo female for evaluation of murmur. Myoview in 2002 negative.Echocardiogram in April of 2010 showed normal LV function, mild left atrial enlargement and mild mitral regurgitation. Cardiac catheterization in June of 2007 showed a 50% left main. There is a 60% ostial LAD. There is a 30% ramus. The left circumflex was occluded. There were left to right collaterals. Ejection fraction was 55% with mild global hypokinesis. She is also status post stenting of her left common and external iliac arteries. Patient has been followed by Dr. Antoine Poche. She was last seen in March of 2010. Since she was last seen she denies dyspnea on exertion, orthopnea, PND, pedal edema, syncope or chest pain. No claudication. Occasional mild dizziness unrelated to position.   Current Outpatient Prescriptions  Medication Sig Dispense Refill  . ALPRAZolam (XANAX) 0.25 MG tablet Take 0.25 mg by mouth at bedtime as needed.        [name] amLODipine (NORVASC) 10 MG tablet Take 10 mg by mouth daily.        [name] aspirin 81 MG tablet Take 81 mg by mouth daily.        . calcium carbonate (OS-CAL) 600 MG TABS Take 600 mg by mouth 2 (two) times daily with a meal.        . Ferrous Sulfate (IRON) 325 (65 FE) MG TABS Take by mouth. 1 tab bid       . fish oil-omega-3 fatty acids 1000 MG capsule Take 1 g by mouth daily. Three times daily       . furosemide (LASIX) 80 MG tablet Take 80 mg by mouth daily.        [name] gemfibrozil (LOPID) 600 MG tablet Take 600 mg by mouth 2 (two) times daily before a meal.        . loratadine (CLARITIN) 10 MG tablet Take 10 mg by mouth daily.        [name] losartan (COZAAR) 100 MG tablet Take 100 mg by mouth daily.        . metFORMIN (GLUCOPHAGE) 1000 MG tablet Take 1,000 mg by mouth 2 (two) times daily with a meal.        . metoprolol tartrate (LOPRESSOR) 25 MG tablet Take 25 mg by mouth 2 (two) times daily.        . Multiple Vitamins-Minerals (MULTI FOR HER 50+ PO) Take by mouth. 1 tab daily       . pantoprazole  (PROTONIX) 40 MG tablet Take 40 mg by mouth daily.        . potassium chloride (K-DUR) 10 MEQ tablet Take 10 mEq by mouth.        . pravastatin (PRAVACHOL) 40 MG tablet Take 40 mg by mouth daily.        . sertraline (ZOLOFT) 100 MG tablet Take two tabs daily         No Known Allergies  Past Medical History  Diagnosis Date  . CAD (coronary artery disease)   . Peripheral vascular disease     status post left common iliac and external iliac artery stents  . Cerebrovascular disease   . Diverticulitis   . Diabetes mellitus   . COPD (chronic obstructive pulmonary disease)   . Hyperlipidemia   . Depression   . Diastolic congestive heart failure   . Hypertension   . Renal artery stenosis     status post    Past Surgical History  Procedure Date  . Carotid endarterectomy   . Cholecystectomy   . Endarterectomy   .  Appendectomy   . Tonsillectomy   . Cesarean section     History   Social History  . Marital Status: Married    Spouse Name: N/A    Number of Children: N/A  . Years of Education: N/A   Occupational History  . Not on file.   Social History Main Topics  . Smoking status: Current Everyday Smoker  . Smokeless tobacco: Not on file  . Alcohol Use: Not on file  . Drug Use: Not on file  . Sexually Active: Not on file   Other Topics Concern  . Not on file   Social History Narrative  . No narrative on file    No family history on file.  ROS: no fevers or chills, productive cough, hemoptysis, dysphasia, odynophagia, melena, hematochezia, dysuria, hematuria, rash, seizure activity, orthopnea, PND, pedal edema, claudication. Remaining systems are negative.  Physical Exam: General:  Well developed/well nourished in NAD Skin warm/dry Patient not depressed No peripheral clubbing Back-normal HEENT-normal/normal eyelids Neck supple/normal carotid upstroke bilaterally; bilateral bruits; no JVD; no thyromegaly chest - CTA/ normal expansion CV - RRR/normal S1 and S2;  no  rubs or gallops;  PMI nondisplaced, 2/6 systolic ejection murmur Abdomen -NT/ND, no HSM, no mass, + bowel sounds, no bruit 2+ femoral pulses, no bruits Ext-no edema, chords, 2+ DP Neuro-grossly nonfocal  ECG sinus bradycardia at a rate of 57. Nonspecific ST changes. Right axis deviation.

## 2010-11-19 NOTE — Assessment & Plan Note (Signed)
Repeat renal Dopplers. 

## 2010-11-19 NOTE — Patient Instructions (Signed)
Your physician has requested that you have a carotid duplex. This test is an ultrasound of the carotid arteries in your neck. It looks at blood flow through these arteries that supply the brain with blood. Allow one hour for this exam. There are no restrictions or special instructions.    Your physician has requested that you have a renal artery duplex. During this test, an ultrasound is used to evaluate blood flow to the kidneys. Allow one hour for this exam. Do not eat after midnight the day before and avoid carbonated beverages. Take your medications as you usually do.  Your physician has requested that you have an ankle brachial index (ABI). During this test an ultrasound and blood pressure cuff are used to evaluate the arteries that supply the arms and legs with blood. Allow thirty minutes for this exam. There are no restrictions or special instructions.  Your physician recommends that you schedule a follow-up appointment in: 6 weeks with Dr Antoine Poche.

## 2010-11-23 ENCOUNTER — Institutional Professional Consult (permissible substitution): Payer: Self-pay | Admitting: Cardiology

## 2010-11-30 LAB — CBC
HCT: 31.4 — ABNORMAL LOW
Hemoglobin: 10.4 — ABNORMAL LOW
Hemoglobin: 10.6 — ABNORMAL LOW
MCHC: 33.5
MCHC: 33.7
MCV: 93.6
RBC: 3.32 — ABNORMAL LOW
RBC: 3.35 — ABNORMAL LOW
RDW: 15.3
WBC: 8.9

## 2010-11-30 LAB — BASIC METABOLIC PANEL
BUN: 33 — ABNORMAL HIGH
CO2: 28
CO2: 28
CO2: 31
Calcium: 10.1
Chloride: 103
Chloride: 105
GFR calc Af Amer: >60
GFR calc Af Amer: >60
GFR calc non Af Amer: 41 — ABNORMAL LOW
Glucose, Bld: 150 — ABNORMAL HIGH
Glucose, Bld: 96
Glucose, Bld: 155 — ABNORMAL HIGH
Potassium: 3.8
Sodium: 138
Sodium: 139

## 2010-11-30 LAB — B-NATRIURETIC PEPTIDE (CONVERTED LAB): Pro B Natriuretic peptide (BNP): 396 — ABNORMAL HIGH

## 2010-12-08 ENCOUNTER — Inpatient Hospital Stay (INDEPENDENT_AMBULATORY_CARE_PROVIDER_SITE_OTHER)
Admission: RE | Admit: 2010-12-08 | Discharge: 2010-12-08 | Disposition: A | Discharge status: Home / Self Care | Payer: Self-pay | Source: Ambulatory Visit | Attending: Emergency Medicine | Admitting: Emergency Medicine

## 2010-12-08 DIAGNOSIS — M79609 Pain in unspecified limb: Secondary | ICD-10-CM

## 2010-12-14 ENCOUNTER — Ambulatory Visit: Payer: Self-pay | Admitting: Family Medicine

## 2010-12-20 ENCOUNTER — Encounter: Payer: Self-pay | Admitting: Cardiology

## 2010-12-20 ENCOUNTER — Encounter (INDEPENDENT_AMBULATORY_CARE_PROVIDER_SITE_OTHER): Payer: Self-pay | Admitting: Cardiology

## 2010-12-20 DIAGNOSIS — I6529 Occlusion and stenosis of unspecified carotid artery: Secondary | ICD-10-CM

## 2010-12-20 DIAGNOSIS — I679 Cerebrovascular disease, unspecified: Secondary | ICD-10-CM

## 2010-12-20 DIAGNOSIS — I700 Atherosclerosis of aorta: Secondary | ICD-10-CM

## 2010-12-20 DIAGNOSIS — I739 Peripheral vascular disease, unspecified: Secondary | ICD-10-CM

## 2010-12-20 DIAGNOSIS — I701 Atherosclerosis of renal artery: Secondary | ICD-10-CM

## 2010-12-29 ENCOUNTER — Encounter: Payer: Self-pay | Admitting: Cardiology

## 2010-12-30 ENCOUNTER — Encounter: Payer: Self-pay | Admitting: *Deleted

## 2010-12-30 ENCOUNTER — Ambulatory Visit (INDEPENDENT_AMBULATORY_CARE_PROVIDER_SITE_OTHER): Payer: Self-pay | Admitting: Cardiology

## 2010-12-30 ENCOUNTER — Encounter: Payer: Self-pay | Admitting: Cardiology

## 2010-12-30 DIAGNOSIS — I2510 Atherosclerotic heart disease of native coronary artery without angina pectoris: Secondary | ICD-10-CM

## 2010-12-30 DIAGNOSIS — I739 Peripheral vascular disease, unspecified: Secondary | ICD-10-CM

## 2010-12-30 DIAGNOSIS — E119 Type 2 diabetes mellitus without complications: Secondary | ICD-10-CM

## 2010-12-30 DIAGNOSIS — I10 Essential (primary) hypertension: Secondary | ICD-10-CM

## 2010-12-30 DIAGNOSIS — F17200 Nicotine dependence, unspecified, uncomplicated: Secondary | ICD-10-CM

## 2010-12-30 DIAGNOSIS — I679 Cerebrovascular disease, unspecified: Secondary | ICD-10-CM

## 2010-12-30 DIAGNOSIS — E785 Hyperlipidemia, unspecified: Secondary | ICD-10-CM

## 2010-12-30 NOTE — Assessment & Plan Note (Signed)
She will continue with risk reduction.

## 2010-12-30 NOTE — Patient Instructions (Signed)
Your physician recommends that you return for a FASTING lipid profile: at your convienence.  The current medical regimen is effective;  continue present plan and medications.  Follow up in 6 months with Dr Antoine Poche.  You will receive a letter in the mail 2 months before you are due.  Please call us when you receive this letter to schedule your follow up appointment.

## 2010-12-30 NOTE — Progress Notes (Signed)
HPI The patient presents for followup of multiple cardiovascular risk factors and known coronary disease. She saw Dr. Jens Som recently. She was referred back because of difficult to control HTN.  He renal Dopplers which demonstrated moderate right renal artery stenosis. She thinks her blood pressures have been reasonable recently when she checks them at the drugstore. She still smoking 4-5 cigarettes per day though she is trying to stop. She denies any chest pressure, neck or arm discomfort. She's having no new shortness of breath, PND or orthopnea. She has had no weight gain or edema.  Of note I also reviewed carotid Dopplers which demonstrated 60-79% right stenosis and 40-59% left. Her ABIs demonstrate some PVD but improved compared to previous. She's not describing any claudication.   No Known Allergies  Current Outpatient Prescriptions  Medication Sig Dispense Refill  . ALPRAZolam (XANAX) 0.25 MG tablet Take 0.25 mg by mouth at bedtime as needed.        [name] amLODipine (NORVASC) 10 MG tablet Take 10 mg by mouth daily.        [name] aspirin 81 MG tablet Take 81 mg by mouth daily.        . calcium carbonate (OS-CAL) 600 MG TABS Take 600 mg by mouth 2 (two) times daily with a meal.        . Ferrous Sulfate (IRON) 325 (65 FE) MG TABS Take by mouth. 1 tab bid       . fish oil-omega-3 fatty acids 1000 MG capsule Take 1 g by mouth daily. Three times daily       . furosemide (LASIX) 80 MG tablet Take 80 mg by mouth daily.        [name] gemfibrozil (LOPID) 600 MG tablet Take 600 mg by mouth 2 (two) times daily before a meal.        . loratadine (CLARITIN) 10 MG tablet Take 10 mg by mouth daily.        [name] losartan (COZAAR) 100 MG tablet Take 100 mg by mouth daily.        . metFORMIN (GLUCOPHAGE) 1000 MG tablet Take 1,000 mg by mouth 2 (two) times daily with a meal.        . metoprolol tartrate (LOPRESSOR) 25 MG tablet Take 25 mg by mouth 2 (two) times daily.        . Multiple Vitamins-Minerals (MULTI FOR HER 50+  PO) Take by mouth. 1 tab daily       . pantoprazole (PROTONIX) 40 MG tablet Take 40 mg by mouth daily.        . potassium chloride (K-DUR) 10 MEQ tablet Take 10 mEq by mouth.        . pravastatin (PRAVACHOL) 40 MG tablet Take 40 mg by mouth daily.        . sertraline (ZOLOFT) 100 MG tablet Take two tabs daily         Past Medical History  Diagnosis Date  . CAD (coronary artery disease)   . Peripheral vascular disease     status post left common iliac and external iliac artery stents  . Cerebrovascular disease   . Diverticulitis   . Diabetes mellitus   . COPD (chronic obstructive pulmonary disease)   . Hyperlipidemia   . Depression   . Diastolic congestive heart failure   . Hypertension   . Renal artery stenosis     status post    Past Surgical History  Procedure Date  . Carotid endarterectomy   . Cholecystectomy   .  Endarterectomy   . Appendectomy   . Tonsillectomy   . Cesarean section     ROS:  As stated in the HPI and negative for all other systems.  PHYSICAL EXAM BP 142/64  Pulse 67  Resp 16  Ht 4\' 9"  (1.448 m)  Wt 154 lb (69.854 kg)  BMI 33.33 kg/m2 GENERAL:  Well appearing HEENT:  Pupils equal round and reactive, fundi not visualized, oral mucosa unremarkable, poor dentition NECK:  No jugular venous distention, waveform within normal limits, carotid upstroke brisk and symmetric, bilateral bruits, left CEA scar no thyromegaly LYMPHATICS:  No cervical, inguinal adenopathy LUNGS:  Clear to auscultation bilaterally BACK:  No CVA tenderness CHEST:  Unremarkable HEART:  PMI not displaced or sustained,S1 and S2 within normal limits, no S3, no S4, no clicks, no rubs, soft apical systolic murmur ABD:  Flat, positive bowel sounds normal in frequency in pitch, no bruits, no rebound, no guarding, no midline pulsatile mass, no hepatomegaly, no splenomegaly EXT:  2 plus pulses upper absent DP/PT, no edema, no cyanosis no clubbing SKIN:  No rashes no nodules NEURO:   Cranial nerves II through XII grossly intact, motor grossly intact throughout PSYCH:  Cognitively intact, oriented to person place and time   ASSESSMENT AND PLAN

## 2010-12-30 NOTE — Assessment & Plan Note (Signed)
I did discuss stopping smoking strategies.

## 2010-12-30 NOTE — Assessment & Plan Note (Signed)
Lab Results  Component Value Date   HGBA1C  Value: 5.3 (NOTE)                                                                       According to the ADA Clinical Practice Recommendations for 2011, when HbA1c is used as a screening test:   >=6.5%   Diagnostic of Diabetes Mellitus           (if abnormal result  is confirmed)  5.7-6.4%   Increased risk of developing Diabetes Mellitus  References:Diagnosis and Classification of Diabetes Mellitus,Diabetes Care,2011,34(Suppl 1):S62-S69 and Standards of Medical Care in         Diabetes - 2011,Diabetes Care,2011,34  (Suppl 1):S11-S61. 11/05/2009   This seems to be well controlled.  I will defer to her primary providers.

## 2010-12-30 NOTE — Assessment & Plan Note (Signed)
She will have follow up carotid Dopplers in six months.

## 2010-12-30 NOTE — Assessment & Plan Note (Signed)
BP Readings from Last 3 Encounters:  12/30/10 142/64  11/19/10 154/60  05/19/08 139/63  Her BP is better today.  I have instructed her to get a BP cuff and keep a diary.  She will continue on current meds.

## 2010-12-30 NOTE — Assessment & Plan Note (Signed)
She has no ongoing symptoms.  I don't think that further study is indicated at this point. Really the best thing for her would be better risk reduction.

## 2010-12-30 NOTE — Assessment & Plan Note (Addendum)
Lab Results  Component Value Date   CHOL 225* 04/13/2010   HDL 37* 04/13/2010   LDLCALC See Comment mg/dL 1/61/0960   TRIG 454* 0/98/1191   CHOLHDL 6.1 Ratio 04/13/2010   I will repeat this with a direct LDL.  She might need increased statin.  She is on combination therapy.

## 2011-01-05 ENCOUNTER — Ambulatory Visit: Payer: Self-pay | Admitting: Cardiology

## 2011-01-17 ENCOUNTER — Other Ambulatory Visit (INDEPENDENT_AMBULATORY_CARE_PROVIDER_SITE_OTHER): Payer: Self-pay | Admitting: *Deleted

## 2011-01-17 DIAGNOSIS — E785 Hyperlipidemia, unspecified: Secondary | ICD-10-CM

## 2011-01-17 LAB — LDL CHOLESTEROL, DIRECT: Direct LDL: 155.9 mg/dL

## 2011-01-17 LAB — LIPID PANEL: VLDL: 65.4 mg/dL — ABNORMAL HIGH (ref 0.0–40.0)

## 2011-03-13 ENCOUNTER — Encounter (HOSPITAL_COMMUNITY): Payer: Self-pay | Admitting: *Deleted

## 2011-03-13 ENCOUNTER — Emergency Department (HOSPITAL_COMMUNITY)
Admission: EM | Admit: 2011-03-13 | Discharge: 2011-03-13 | Disposition: A | Discharge status: Home / Self Care | Payer: Self-pay | Source: Home / Self Care | Attending: Family Medicine | Admitting: Family Medicine

## 2011-03-13 DIAGNOSIS — J209 Acute bronchitis, unspecified: Secondary | ICD-10-CM

## 2011-03-13 MED ORDER — DEXTROMETHORPHAN POLISTIREX 30 MG/5ML PO LQCR: 60.0000 mg | ORAL | Status: AC | PRN

## 2011-03-13 MED ORDER — CEFUROXIME AXETIL 250 MG PO TABS: 250.0000 mg | ORAL_TABLET | Freq: Two times a day (BID) | ORAL | Status: AC

## 2011-03-13 NOTE — ED Provider Notes (Signed)
History     CSN: [id number]  Arrival date & time 03/13/11  7829   First MD Initiated Contact with Patient 03/13/11 1001      Chief Complaint  Patient presents with  . Nasal Congestion  . Cough  . Headache    (Consider location/radiation/quality/duration/timing/severity/associated sxs/prior treatment) Patient is a 61 y.o. female presenting with cough and headaches. The history is provided by the patient and a relative.  Cough This is a new problem. The current episode started more than 1 week ago. The problem occurs constantly. The problem has been gradually worsening. The cough is productive of sputum. There has been no fever. Associated symptoms include headaches, rhinorrhea, shortness of breath and wheezing. Pertinent negatives include no chills and no sore throat. She is a smoker. Her past medical history is significant for bronchitis.  Headache The primary symptoms include headaches. Primary symptoms do not include fever.    Past Medical History  Diagnosis Date  . Peripheral vascular disease     status post left common iliac and external iliac artery stents  . Cerebrovascular disease   . Diverticulitis   . Diabetes mellitus   . COPD (chronic obstructive pulmonary disease)   . Hyperlipidemia   . Depression   . Diastolic congestive heart failure   . Hypertension   . Renal artery stenosis     status post  . CAD (coronary artery disease)     50% left main stenosis, ostial 60% LAD stenosis, 30% ramus intermediate stenosis, occluded right artery with left-to-right collaterals    Past Surgical History  Procedure Date  . Carotid endarterectomy   . Cholecystectomy   . Endarterectomy   . Appendectomy   . Tonsillectomy   . Cesarean section   . Shoulder arthroscopy     History reviewed. No pertinent family history.  History  Substance Use Topics  . Smoking status: Current Everyday Smoker  . Smokeless tobacco: Not on file  . Alcohol Use: No    OB History    Grav  Para Term Preterm Abortions TAB SAB Ect Mult Living                  Review of Systems  Constitutional: Negative.  Negative for fever and chills.  HENT: Positive for congestion and rhinorrhea. Negative for sore throat.   Respiratory: Positive for cough, shortness of breath and wheezing.   Gastrointestinal: Negative.   Neurological: Positive for headaches.    Allergies  Review of patient's allergies indicates no known allergies.  Home Medications   Current Outpatient Rx  Name Route Sig Dispense Refill  . ALPRAZOLAM 0.25 MG PO TABS Oral Take 0.25 mg by mouth 2 (two) times daily.     [name] AMLODIPINE BESYLATE 10 MG PO TABS Oral Take 10 mg by mouth daily.      . ASPIRIN 81 MG PO TABS Oral Take 81 mg by mouth daily.      [name] CALCIUM CARBONATE 600 MG PO TABS Oral Take 600 mg by mouth 2 (two) times daily with a meal.      . IRON 325 (65 FE) MG PO TABS Oral Take by mouth. 1 tab bid     . OMEGA-3 FATTY ACIDS 1000 MG PO CAPS Oral Take 1 g by mouth daily. Three times daily     . FUROSEMIDE 80 MG PO TABS Oral Take 80 mg by mouth daily.      [name] GEMFIBROZIL 600 MG PO TABS Oral Take 600 mg by mouth 2 (two)  times daily before a meal.      . GUAIFENESIN ER 600 MG PO TB12 Oral Take 1,200 mg by mouth 2 (two) times daily.    [name] LOSARTAN POTASSIUM 100 MG PO TABS Oral Take 100 mg by mouth daily.      [name] METFORMIN HCL 1000 MG PO TABS Oral Take 1,000 mg by mouth 2 (two) times daily with a meal.      . METOPROLOL TARTRATE 25 MG PO TABS Oral Take 25 mg by mouth 2 (two) times daily.      Malachy Mood FOR HER 50+ PO Oral Take by mouth. 1 tab daily     . PANTOPRAZOLE SODIUM 40 MG PO TBEC Oral Take 40 mg by mouth daily.      [name] POTASSIUM CHLORIDE ER 10 MEQ PO TBCR Oral Take 10 mEq by mouth.      [name] PRAVASTATIN SODIUM 40 MG PO TABS Oral Take 40 mg by mouth daily.      . SERTRALINE HCL 100 MG PO TABS  Take two tabs daily     . LORATADINE 10 MG PO TABS Oral Take 10 mg by mouth daily.        BP 137/43  Pulse 60  Temp(Src)  99 F (37.2 C) (Oral)  Resp 18  SpO2 97%  Physical Exam  Nursing note and vitals reviewed. Constitutional: She is oriented to person, place, and time. She appears well-developed and well-nourished.  HENT:  Head: Normocephalic.  Right Ear: External ear normal.  Left Ear: External ear normal.  Mouth/Throat: Oropharynx is clear and moist.  Eyes: EOM are normal. Pupils are equal, round, and reactive to light.  Neck: Normal range of motion. Neck supple.  Cardiovascular: Normal rate, normal heart sounds and intact distal pulses.   Pulmonary/Chest: Effort normal and breath sounds normal.  Lymphadenopathy:    She has no cervical adenopathy.  Neurological: She is alert and oriented to person, place, and time.  Skin: Skin is warm and dry.  Psychiatric: She has a normal mood and affect.    ED Course  Procedures (including critical care time)  Labs Reviewed - No data to display No results found.   No diagnosis found.    MDM          Barkley Bruns, MD 03/13/11 [phone]

## 2011-03-13 NOTE — ED Notes (Signed)
Pt with c/o sinus congestion/headache and cough onset x one week - productive cough - coughing at night unable to rest - abd sore with coughing

## 2011-03-26 ENCOUNTER — Telehealth: Payer: Self-pay | Admitting: Hematology and Oncology

## 2011-03-26 NOTE — Telephone Encounter (Signed)
lmonvm for pt re appt for 2/11 and 2/13. Schedule mailed today.

## 2011-04-04 ENCOUNTER — Other Ambulatory Visit (HOSPITAL_BASED_OUTPATIENT_CLINIC_OR_DEPARTMENT_OTHER): Payer: Self-pay | Admitting: Lab

## 2011-04-04 DIAGNOSIS — K922 Gastrointestinal hemorrhage, unspecified: Secondary | ICD-10-CM

## 2011-04-04 DIAGNOSIS — F17200 Nicotine dependence, unspecified, uncomplicated: Secondary | ICD-10-CM

## 2011-04-04 DIAGNOSIS — D500 Iron deficiency anemia secondary to blood loss (chronic): Secondary | ICD-10-CM

## 2011-04-04 DIAGNOSIS — D509 Iron deficiency anemia, unspecified: Secondary | ICD-10-CM

## 2011-04-04 LAB — CBC WITH DIFFERENTIAL/PLATELET
BASO%: 0.5 % (ref 0.0–2.0)
Eosinophils Absolute: 0.1 10*3/uL (ref 0.0–0.5)
HCT: 37.6 % (ref 34.8–46.6)
LYMPH%: 14.9 % (ref 14.0–49.7)
MCHC: 34.1 g/dL (ref 31.5–36.0)
MONO#: 0.6 10*3/uL (ref 0.1–0.9)
NEUT#: 5.6 10*3/uL (ref 1.5–6.5)
Platelets: 290 10*3/uL (ref 145–400)
RBC: 4.13 10*6/uL (ref 3.70–5.45)
WBC: 7.4 10*3/uL (ref 3.9–10.3)
lymph#: 1.1 10*3/uL (ref 0.9–3.3)

## 2011-04-04 LAB — FERRITIN: Ferritin: 43 ng/mL (ref 10–291)

## 2011-04-04 LAB — BASIC METABOLIC PANEL
CO2: 23 mEq/L (ref 19–32)
Calcium: 9.8 mg/dL (ref 8.4–10.5)
Chloride: 103 mEq/L (ref 96–112)
Glucose, Bld: 211 mg/dL — ABNORMAL HIGH (ref 70–99)
Sodium: 140 mEq/L (ref 135–145)

## 2011-04-04 LAB — IRON AND TIBC
%SAT: 14 % — ABNORMAL LOW (ref 20–55)
TIBC: 542 ug/dL — ABNORMAL HIGH (ref 250–470)

## 2011-04-05 ENCOUNTER — Encounter: Payer: Self-pay | Admitting: *Deleted

## 2011-04-06 ENCOUNTER — Ambulatory Visit (HOSPITAL_BASED_OUTPATIENT_CLINIC_OR_DEPARTMENT_OTHER): Payer: Self-pay | Admitting: Hematology and Oncology

## 2011-04-06 ENCOUNTER — Telehealth: Payer: Self-pay | Admitting: Hematology and Oncology

## 2011-04-06 VITALS — BP 142/60 | HR 64 | Temp 97.7°F | Ht <= 58 in | Wt 153.4 lb

## 2011-04-06 DIAGNOSIS — D638 Anemia in other chronic diseases classified elsewhere: Secondary | ICD-10-CM

## 2011-04-06 DIAGNOSIS — D649 Anemia, unspecified: Secondary | ICD-10-CM

## 2011-04-06 DIAGNOSIS — D509 Iron deficiency anemia, unspecified: Secondary | ICD-10-CM

## 2011-04-06 NOTE — Progress Notes (Signed)
This office note has been dictated.

## 2011-04-06 NOTE — Progress Notes (Signed)
CC:   [name] Sorenson, MD [name]. [name], M.D. [name]. [name] Goodell, MD  IDENTIFYING STATEMENT:  The patient is a 61 year old woman with anemia who presents for followup.  INTERVAL HISTORY:  The patient last received Feraheme on November 03, 2009.  Denies any overt blood loss.  There is a little fatigue and is taking oral iron.  She is here to review her lab results.  MEDICATIONS:  Reviewed and updated.  ALLERGIES:  None.  PAST MEDICAL HISTORY/FAMILY HISTORY/SOCIAL HISTORY:  Unchanged.  REVIEW OF SYSTEMS:  Besides fatigue essentially negative.  PHYSICAL EXAM:  Alert and oriented x3.  Vitals:  Pulse 64, blood pressure 146/60, temperature 97.7, respirations 20, weight 153.4 pounds. HEENT:  Head is atraumatic, normocephalic.  Sclerae anicteric.  Mouth moist.  Chest:  Clear.  Abdomen:  Soft.  Extremities:  Trace edema.  LAB DATA:  04/04/2011 white cell count 7.4, hemoglobin 12.8, hematocrit 37.6, platelets 290.  Sodium 140, potassium 4.1, chloride 103, CO2 23, BUN 50, creatinine 1.27, glucose 211, calcium 9.8.  Iron 74, TIBC 542, saturation 14%, ferritin 43 (149).  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  She also has anemia of chronic disease.  She has diabetes.  She has received IV iron here at the clinic and was last transfused on 09/31/2011.  Iron levels are somewhat adequate although declining.  Because she will not be seeing me for another 9 months, I will have her come in to receive IV Feraheme on Monday.  I urged her to continue with prescription iron 3 times a day.    ______________________________ [name], M.D. LIO/MEDQ  D:  04/06/2011  T:  04/06/2011  Job:  [id number]

## 2011-04-06 NOTE — Telephone Encounter (Signed)
appt made and printed for 2/18 and 11/5 and 6     aom

## 2011-04-11 ENCOUNTER — Ambulatory Visit (HOSPITAL_BASED_OUTPATIENT_CLINIC_OR_DEPARTMENT_OTHER): Payer: Self-pay

## 2011-04-11 VITALS — BP 150/73 | HR 74 | Temp 97.8°F

## 2011-04-11 DIAGNOSIS — D539 Nutritional anemia, unspecified: Secondary | ICD-10-CM

## 2011-04-11 MED ORDER — SODIUM CHLORIDE 0.9 % IV SOLN
Freq: Once | INTRAVENOUS | Status: AC
  Administered 2011-04-11: 15:00:00 via INTRAVENOUS

## 2011-04-11 MED ORDER — SODIUM CHLORIDE 0.9 % IV SOLN
1020.0000 mg | Freq: Once | INTRAVENOUS | Status: AC
  Administered 2011-04-11: 1020 mg via INTRAVENOUS
  Filled 2011-04-11: qty 34

## 2011-05-09 ENCOUNTER — Encounter (HOSPITAL_COMMUNITY): Payer: Self-pay | Admitting: Emergency Medicine

## 2011-05-09 ENCOUNTER — Other Ambulatory Visit: Payer: Self-pay

## 2011-05-09 ENCOUNTER — Emergency Department (HOSPITAL_COMMUNITY)
Admission: EM | Admit: 2011-05-09 | Discharge: 2011-05-09 | Disposition: A | Discharge status: Home / Self Care | Payer: Self-pay | Attending: Emergency Medicine | Admitting: Emergency Medicine

## 2011-05-09 DIAGNOSIS — R197 Diarrhea, unspecified: Secondary | ICD-10-CM | POA: Insufficient documentation

## 2011-05-09 DIAGNOSIS — J449 Chronic obstructive pulmonary disease, unspecified: Secondary | ICD-10-CM | POA: Insufficient documentation

## 2011-05-09 DIAGNOSIS — I2510 Atherosclerotic heart disease of native coronary artery without angina pectoris: Secondary | ICD-10-CM | POA: Insufficient documentation

## 2011-05-09 DIAGNOSIS — F17200 Nicotine dependence, unspecified, uncomplicated: Secondary | ICD-10-CM | POA: Insufficient documentation

## 2011-05-09 DIAGNOSIS — I509 Heart failure, unspecified: Secondary | ICD-10-CM | POA: Insufficient documentation

## 2011-05-09 DIAGNOSIS — K047 Periapical abscess without sinus: Secondary | ICD-10-CM | POA: Insufficient documentation

## 2011-05-09 DIAGNOSIS — R109 Unspecified abdominal pain: Secondary | ICD-10-CM | POA: Insufficient documentation

## 2011-05-09 DIAGNOSIS — R6884 Jaw pain: Secondary | ICD-10-CM | POA: Insufficient documentation

## 2011-05-09 DIAGNOSIS — E119 Type 2 diabetes mellitus without complications: Secondary | ICD-10-CM | POA: Insufficient documentation

## 2011-05-09 DIAGNOSIS — J4489 Other specified chronic obstructive pulmonary disease: Secondary | ICD-10-CM | POA: Insufficient documentation

## 2011-05-09 DIAGNOSIS — E785 Hyperlipidemia, unspecified: Secondary | ICD-10-CM | POA: Insufficient documentation

## 2011-05-09 DIAGNOSIS — I5030 Unspecified diastolic (congestive) heart failure: Secondary | ICD-10-CM | POA: Insufficient documentation

## 2011-05-09 DIAGNOSIS — R51 Headache: Secondary | ICD-10-CM | POA: Insufficient documentation

## 2011-05-09 LAB — CBC
HCT: 37 % (ref 36.0–46.0)
Hemoglobin: 12.7 g/dL (ref 12.0–15.0)
MCH: 31.5 pg (ref 26.0–34.0)
MCHC: 34.3 g/dL (ref 30.0–36.0)
MCV: 91.8 fL (ref 78.0–100.0)
Platelets: 250 10*3/uL (ref 150–400)
RBC: 4.03 MIL/uL (ref 3.87–5.11)
RDW: 13.9 % (ref 11.5–15.5)
WBC: 7.4 10*3/uL (ref 4.0–10.5)

## 2011-05-09 LAB — BASIC METABOLIC PANEL
BUN: 49 mg/dL — ABNORMAL HIGH (ref 6–23)
CO2: 26 mEq/L (ref 19–32)
Calcium: 9.7 mg/dL (ref 8.4–10.5)
Chloride: 103 mEq/L (ref 96–112)
Creatinine, Ser: 1.27 mg/dL — ABNORMAL HIGH (ref 0.50–1.10)
GFR calc Af Amer: 52 mL/min — ABNORMAL LOW (ref >90)
GFR calc non Af Amer: 45 mL/min — ABNORMAL LOW (ref >90)
Glucose, Bld: 87 mg/dL (ref 70–99)
Potassium: 4.3 mEq/L (ref 3.5–5.1)
Sodium: 141 mEq/L (ref 135–145)

## 2011-05-09 MED ORDER — PENICILLIN V POTASSIUM 500 MG PO TABS: 500.0000 mg | ORAL_TABLET | Freq: Four times a day (QID) | ORAL | Status: AC

## 2011-05-09 NOTE — ED Provider Notes (Signed)
History     CSN: [id number]  Arrival date & time 05/09/11  1006   First MD Initiated Contact with Patient 05/09/11 1014      Chief Complaint  Patient presents with  . Headache  . Diarrhea  . Abdominal Pain    (Consider location/radiation/quality/duration/timing/severity/associated sxs/prior treatment) HPI [name] is a 61 yo female with a strong cardiac history, who presents today with jaw pain.  She states that the pain has been occurring for the past 2 weeks, since she changed her blood pressure medication.  She called her PCP because of this, along with blurred vision, dizziness, and tinnitus.  They recommended her to take ASA then call 911 to be transported to the hospital.  Per pt, "vitals were good in ambulance."  Pt states her tinnitus, and blurred vision has resolved.  She denies any SOB, chest pain, wheezing, coughing, fever, chills, fatigue.  Has not seen a dentist in many years as she does not have insurance.  Also has many teeth missing, especially on the lower left where her pain is most prominent. Past Medical History  Diagnosis Date  . Peripheral vascular disease     status post left common iliac and external iliac artery stents  . Cerebrovascular disease   . Diverticulitis   . Diabetes mellitus   . COPD (chronic obstructive pulmonary disease)   . Hyperlipidemia   . Depression   . Diastolic congestive heart failure   . Hypertension   . Renal artery stenosis     status post  . CAD (coronary artery disease)     50% left main stenosis, ostial 60% LAD stenosis, 30% ramus intermediate stenosis, occluded right artery with left-to-right collaterals    Past Surgical History  Procedure Date  . Carotid endarterectomy   . Cholecystectomy   . Endarterectomy   . Appendectomy   . Tonsillectomy   . Cesarean section   . Shoulder arthroscopy     History reviewed. No pertinent family history.  History  Substance Use Topics  . Smoking status: Current Everyday Smoker    . Smokeless tobacco: Not on file  . Alcohol Use: No    OB History    Grav Para Term Preterm Abortions TAB SAB Ect Mult Living                  Review of Systems All pertinent positives and negatives reviewed in the history of present illness  Allergies  Review of patient's allergies indicates no known allergies.  Home Medications   Current Outpatient Rx  Name Route Sig Dispense Refill  . ACETAMINOPHEN 500 MG PO TABS Oral Take 500 mg by mouth every 6 (six) hours as needed. For pain    . ALBUTEROL SULFATE HFA 108 (90 BASE) MCG/ACT IN AERS Inhalation Inhale 2 puffs into the lungs every 6 (six) hours as needed. For shortness of breath    . ALPRAZOLAM 0.25 MG PO TABS Oral Take 0.25 mg by mouth 2 (two) times daily.     [name] AMLODIPINE BESYLATE 10 MG PO TABS Oral Take 10 mg by mouth daily.      . ASPIRIN 81 MG PO TABS Oral Take 81 mg by mouth daily.      [name] CALCIUM CARBONATE 600 MG PO TABS Oral Take 600 mg by mouth 3 (three) times daily with meals.     [name] CLONIDINE HCL 0.1 MG PO TABS Oral Take 0.1 mg by mouth 2 (two) times daily.    . IRON 325 (65  FE) MG PO TABS Oral Take by mouth. 1 tab bid     . OMEGA-3 FATTY ACIDS 1000 MG PO CAPS Oral Take 1 g by mouth daily. Three times daily     . FUROSEMIDE 80 MG PO TABS Oral Take 80 mg by mouth daily.      [name] GEMFIBROZIL 600 MG PO TABS Oral Take 600 mg by mouth 2 (two) times daily before a meal.     . GUAIFENESIN ER 600 MG PO TB12 Oral Take 1,200 mg by mouth 2 (two) times daily as needed.     [name] LISINOPRIL 40 MG PO TABS Oral Take 40 mg by mouth daily.    [name] METFORMIN HCL 1000 MG PO TABS Oral Take 1,000 mg by mouth 2 (two) times daily with a meal.      . MULTI FOR HER 50+ PO Oral Take by mouth. 1 tab daily     . PANTOPRAZOLE SODIUM 40 MG PO TBEC Oral Take 40 mg by mouth daily.      [name] POTASSIUM CHLORIDE ER 10 MEQ PO TBCR Oral Take 10 mEq by mouth.      [name] PRAVASTATIN SODIUM 40 MG PO TABS Oral Take 40 mg by mouth daily.      . SERTRALINE HCL 100 MG PO  TABS Oral Take 200 mg by mouth every morning.       BP 119/43  Pulse 60  Temp(Src) 97.7 F (36.5 C) (Oral)  Resp 10  SpO2 96%  Physical Exam  Constitutional: She is oriented to person, place, and time. She appears well-developed and well-nourished. No distress.  HENT:  Mouth/Throat:         Tenderness over lower left jaw.  More tender to palpate from inside mouth than from exterior.  Slight hardening of the area compared to the right side.  Eyes: Pupils are equal, round, and reactive to light. Right eye exhibits no discharge. Left eye exhibits no discharge.  Cardiovascular: Normal rate, regular rhythm, normal heart sounds and intact distal pulses.   No murmur heard. Pulmonary/Chest: Effort normal and breath sounds normal. No respiratory distress. She has no wheezes. She has no rales.  Abdominal: Soft. Bowel sounds are normal. She exhibits no distension. There is no tenderness.  Neurological: She is alert and oriented to person, place, and time.  Skin: Skin is warm. She is not diaphoretic.    ED Course  Procedures (including critical care time)  Labs Reviewed - No data to display No results found.  Pt seen and evaluated.  Based on PE and vitals, no concern with cardiac origin.  12:09 PM Checked on patient who is asleep and resting comfortably in bed. MDM  This is most likely a small dental abscess based on her HPI and PE. The patient is stable here in the ER. Told to return here as needed. Follow up dentist will be given.        Carlyle Dolly, PA-C 05/09/11 1315  Carlyle Dolly, PA-C 05/09/11 1315

## 2011-05-09 NOTE — ED Notes (Addendum)
Per EMS. Pt's BP med was changed 1-2 weeks ago, ever since pt has had a HA, ear ringing, blurred vision, dizziness and L jaw and neck pain.  EMS stroke scale negative, EMS 12 lead negative.  Pt also having some abd pain and diarrhea for the past week

## 2011-05-09 NOTE — Progress Notes (Signed)
Confirms pcp is still eva shaw

## 2011-05-09 NOTE — Discharge Instructions (Signed)
Return here as needed. Follow up with the dentist provided. °

## 2011-05-09 NOTE — ED Notes (Signed)
BJY:[id number]<FA> Expected date:05/09/11<BR> Expected time: 9:47 AM<BR> Means of arrival:Ambulance<BR> Comments:<BR> headache

## 2011-05-09 NOTE — ED Provider Notes (Signed)
Complains of left jaw pain for 2 weeks worse with pressing on the area or chewing. No fever patient reports had vague abdominal pain yesterday lasting one hour which resolved spontaneously had 3 episodes of watery diarrhea yesterday and one episode of diarrhea today. Has only job pain is a 4 on a scale of 1-10. On exam alert nontoxic And poor dentition no this gingival abscess,. Except no cervical lymphadenopathy Left mandible mildly tender no swelling abdomen soft nontender  Doug Sou, MD 05/09/11 1315

## 2011-05-09 NOTE — ED Provider Notes (Signed)
Medical screening examination/treatment/procedure(s) were conducted as a shared visit with non-physician practitioner(s) and myself.  I personally evaluated the patient during the encounter  Doug Sou, MD 05/09/11 [phone]

## 2011-07-22 ENCOUNTER — Ambulatory Visit: Payer: Self-pay | Admitting: Cardiology

## 2011-08-13 ENCOUNTER — Emergency Department (HOSPITAL_COMMUNITY)
Admission: EM | Admit: 2011-08-13 | Discharge: 2011-08-13 | Disposition: A | Discharge status: Home / Self Care | Payer: Self-pay | Source: Home / Self Care | Attending: Emergency Medicine | Admitting: Emergency Medicine

## 2011-08-13 ENCOUNTER — Encounter (HOSPITAL_COMMUNITY): Payer: Self-pay

## 2011-08-13 DIAGNOSIS — H6980 Other specified disorders of Eustachian tube, unspecified ear: Secondary | ICD-10-CM

## 2011-08-13 DIAGNOSIS — K219 Gastro-esophageal reflux disease without esophagitis: Secondary | ICD-10-CM

## 2011-08-13 MED ORDER — FAMOTIDINE 20 MG PO TABS: 20.0000 mg | ORAL_TABLET | Freq: Two times a day (BID) | ORAL | Status: DC

## 2011-08-13 MED ORDER — FEXOFENADINE HCL 180 MG PO TABS: 180.0000 mg | ORAL_TABLET | Freq: Every day | ORAL | Status: DC

## 2011-08-13 MED ORDER — FLUTICASONE PROPIONATE 50 MCG/ACT NA SUSP: 2.0000 | Freq: Every day | NASAL | Status: DC

## 2011-08-13 NOTE — ED Notes (Signed)
Pt states her ears are stopped up, glands sore and she has headache that started yesterday.

## 2011-08-13 NOTE — ED Provider Notes (Signed)
History     CSN: [id number]  Arrival date & time 08/13/11  1803   First MD Initiated Contact with Patient 08/13/11 1819      Chief Complaint  Patient presents with  . Otalgia  . Headache    (Consider location/radiation/quality/duration/timing/severity/associated sxs/prior treatment) HPI Comments: Patient presents with 2 complaints:   First, patient reports sinus congestion, frontal sinus headache, bilateral ear fullness, decreased/muffled hearing, nasal congestion past several days. Reports that she was able to yawn and and "pop" her ears with temporary improvement in hearing, but that this is no longer working. No nausea, vomiting, fevers, purulent nasal drainage. She is also complaining of postnasal drip, and a sore, irritated throat. No coughing, wheezing, shortness of breath.   Second, patient is complaining of  worsening of her GERD and is requesting medication for this. She reports a burning sensation in her back, waterbrash, symptoms worse with lying down and with eating acidic foods. She is currently on PPI. She's also tried TUMS and Maalox with temporary improvement. She's not tried any recently. Denies diaphoresis, radiation, exertional component.  She states that this is identical to her chronic reflux. She has a hiatal hernia.  ROS as noted in HPI. All other ROS negative.   Patient is a 61 y.o. female presenting with ear pain, headaches, and GERD. The history is provided by the patient. No language interpreter was used.  Otalgia This is a new problem. The current episode started yesterday. There is pain in both ears. The problem occurs constantly. The problem has not changed since onset.There has been no fever. Associated symptoms include headaches, rhinorrhea and sore throat. Pertinent negatives include no ear discharge, no hearing loss, no abdominal pain, no vomiting, no neck pain, no cough and no rash.  Headache The primary symptoms include headaches. Primary symptoms do  not include vomiting.  Additional symptoms do not include hearing loss.  Gastrophageal Reflux This is a chronic problem. The current episode started more than 1 week ago. The problem occurs constantly. Associated symptoms include headaches. Pertinent negatives include no abdominal pain. Exacerbated by: Eating. Nothing relieves the symptoms. Treatments tried: Currently on PPI.    Past Medical History  Diagnosis Date  . Peripheral vascular disease     status post left common iliac and external iliac artery stents  . Cerebrovascular disease   . Diverticulitis   . Diabetes mellitus   . COPD (chronic obstructive pulmonary disease)   . Hyperlipidemia   . Depression   . Diastolic congestive heart failure   . Hypertension   . Renal artery stenosis     status post  . CAD (coronary artery disease)     50% left main stenosis, ostial 60% LAD stenosis, 30% ramus intermediate stenosis, occluded right artery with left-to-right collaterals    Past Surgical History  Procedure Date  . Carotid endarterectomy   . Cholecystectomy   . Endarterectomy   . Appendectomy   . Tonsillectomy   . Cesarean section   . Shoulder arthroscopy     History reviewed. No pertinent family history.  History  Substance Use Topics  . Smoking status: Current Everyday Smoker  . Smokeless tobacco: Not on file  . Alcohol Use: No    OB History    Grav Para Term Preterm Abortions TAB SAB Ect Mult Living                  Review of Systems  HENT: Positive for ear pain, sore throat and rhinorrhea. Negative for hearing  loss, neck pain and ear discharge.   Respiratory: Negative for cough.   Gastrointestinal: Negative for vomiting and abdominal pain.  Skin: Negative for rash.  Neurological: Positive for headaches.    Allergies  Review of patient's allergies indicates no known allergies.  Home Medications   Current Outpatient Rx  Name Route Sig Dispense Refill  . ACETAMINOPHEN 500 MG PO TABS Oral Take 500 mg  by mouth every 6 (six) hours as needed. For pain    . ALBUTEROL SULFATE HFA 108 (90 BASE) MCG/ACT IN AERS Inhalation Inhale 2 puffs into the lungs every 6 (six) hours as needed. For shortness of breath    . ALPRAZOLAM 0.25 MG PO TABS Oral Take 0.25 mg by mouth 2 (two) times daily.     [name] AMLODIPINE BESYLATE 10 MG PO TABS Oral Take 10 mg by mouth daily.      . ASPIRIN 81 MG PO TABS Oral Take 81 mg by mouth daily.      [name] CALCIUM CARBONATE 600 MG PO TABS Oral Take 600 mg by mouth 3 (three) times daily with meals.     [name] CLONIDINE HCL 0.1 MG PO TABS Oral Take 0.1 mg by mouth 2 (two) times daily.    [name] FAMOTIDINE 20 MG PO TABS Oral Take 1 tablet (20 mg total) by mouth 2 (two) times daily. 40 tablet 0  . IRON 325 (65 FE) MG PO TABS Oral Take by mouth. 1 tab bid     . FEXOFENADINE HCL 180 MG PO TABS Oral Take 1 tablet (180 mg total) by mouth daily. 14 tablet 0  . OMEGA-3 FATTY ACIDS 1000 MG PO CAPS Oral Take 1 g by mouth daily. Three times daily     . FLUTICASONE PROPIONATE 50 MCG/ACT NA SUSP Nasal Place 2 sprays into the nose daily. 16 g 0  . FUROSEMIDE 80 MG PO TABS Oral Take 80 mg by mouth daily.      [name] GEMFIBROZIL 600 MG PO TABS Oral Take 600 mg by mouth 2 (two) times daily before a meal.     . LISINOPRIL 40 MG PO TABS Oral Take 40 mg by mouth daily.    [name] METFORMIN HCL 1000 MG PO TABS Oral Take 1,000 mg by mouth 2 (two) times daily with a meal.      . MULTI FOR HER 50+ PO Oral Take by mouth. 1 tab daily     . PANTOPRAZOLE SODIUM 40 MG PO TBEC Oral Take 40 mg by mouth daily.      [name] POTASSIUM CHLORIDE ER 10 MEQ PO TBCR Oral Take 10 mEq by mouth.      [name] PRAVASTATIN SODIUM 40 MG PO TABS Oral Take 40 mg by mouth daily.      . SERTRALINE HCL 100 MG PO TABS Oral Take 200 mg by mouth every morning.       BP 173/80  Pulse 61  Temp 98.3 F (36.8 C) (Oral)  Resp 20  SpO2 97%  Physical Exam  Nursing note and vitals reviewed. Constitutional: She is oriented to person, place, and time. She appears  well-developed and well-nourished.  HENT:  Head: Normocephalic and atraumatic. No trismus in the jaw.  Right Ear: Ear canal normal. Tympanic membrane is retracted. Tympanic membrane is not erythematous. No decreased hearing is noted.  Left Ear: Ear canal normal. Tympanic membrane is retracted. Tympanic membrane is not erythematous. No decreased hearing is noted.  Nose: Mucosal edema and rhinorrhea present. No epistaxis.  Mouth/Throat: Uvula  is midline and mucous membranes are normal. No uvula swelling. Posterior oropharyngeal erythema present.       (-) frontal, maxillary sinus tenderness. Tonsils surgically absent  Eyes: Conjunctivae and EOM are normal. Pupils are equal, round, and reactive to light.  Neck: Normal range of motion. Neck supple.  Cardiovascular: Normal rate, regular rhythm and normal heart sounds.   Pulmonary/Chest: Effort normal and breath sounds normal. No respiratory distress. She has no wheezes. She has no rales.  Abdominal: She exhibits no distension. There is no tenderness. There is no rebound and no guarding.  Musculoskeletal: Normal range of motion.  Lymphadenopathy:    She has no cervical adenopathy.  Neurological: She is alert and oriented to person, place, and time. No cranial nerve deficit. Coordination and gait normal.  Skin: Skin is warm and dry. No rash noted.  Psychiatric: She has a normal mood and affect. Her behavior is normal. Judgment and thought content normal.    ED Course  Procedures (including critical care time)  Labs Reviewed - No data to display No results found.   1. GERD (gastroesophageal reflux disease)   2. Eustachian tube dysfunction     MDM   H&P most consistent with eustachian tube dysfunction from nasal congestion. Will start her on some saline nasal irrigation, Flonase, and a nonsedating antihistamine.No signs of otitis media. Also, her chest discomfort is unchanged from her baseline. Doubt ACS or any other serious cause of her  symptoms. Will add an H2 blocker in addition to her PPI. She'll follow her primary care physician about this.  Luiz Blare, MD 08/13/11 2139

## 2011-08-13 NOTE — Discharge Instructions (Signed)
Take the medication as written. Return if you get worse, have a fever >100.4, or for any concerns.  Use a neti pot or the [employer]Med sinus rinse as often as you want to to reduce nasal congestion. Follow the directions on the box.  ° °Go to www.goodrx.com to look up your medications. This will give you a list of where you can find your prescriptions at the most affordable prices.  °

## 2011-08-18 ENCOUNTER — Encounter: Payer: Self-pay | Admitting: Cardiology

## 2011-08-18 ENCOUNTER — Ambulatory Visit (INDEPENDENT_AMBULATORY_CARE_PROVIDER_SITE_OTHER): Payer: Self-pay | Admitting: Cardiology

## 2011-08-18 VITALS — BP 99/44 | HR 68 | Ht <= 58 in | Wt 152.1 lb

## 2011-08-18 DIAGNOSIS — I2510 Atherosclerotic heart disease of native coronary artery without angina pectoris: Secondary | ICD-10-CM

## 2011-08-18 DIAGNOSIS — I080 Rheumatic disorders of both mitral and aortic valves: Secondary | ICD-10-CM

## 2011-08-18 DIAGNOSIS — I10 Essential (primary) hypertension: Secondary | ICD-10-CM

## 2011-08-18 DIAGNOSIS — F17200 Nicotine dependence, unspecified, uncomplicated: Secondary | ICD-10-CM

## 2011-08-18 DIAGNOSIS — I679 Cerebrovascular disease, unspecified: Secondary | ICD-10-CM

## 2011-08-18 NOTE — Assessment & Plan Note (Signed)
She's overdue for carotid Doppler. I will arrange this.

## 2011-08-18 NOTE — Assessment & Plan Note (Signed)
Her blood pressure is actually low.  I will defer management to her primary provider.

## 2011-08-18 NOTE — Assessment & Plan Note (Signed)
The patient has known coronary disease. She does have some dyspnea and severe ongoing risk factors. She's not had a screening stress test that I can find in our system. Her last catheterization several years ago. Like to screen her with an exercise treadmill test but she wouldn't be a little walk on a treadmill. Therefore, she will have a [employer].

## 2011-08-18 NOTE — Assessment & Plan Note (Signed)
She has had only mild mitral regurgitation. I wouldn't suspect this to be worse. No further evaluation is warranted.

## 2011-08-18 NOTE — Progress Notes (Signed)
HPI The patient presents for followup of multiple cardiovascular risk factors and known coronary disease. Since I last saw her she said no new cardiovascular problems. She's had her medications changed significantly for management of hypertension and blood pressure by her primary provider. She's not taking clonidine, lisinopril and no longer taking metoprolol. She's on rosuvastatin instead of pravastatin. She said she's had lipids drawn but I can't find these.  She's not particularly active. With her level of activity he's not describing any chest pressure, neck or arm discomfort. She's not describing any palpitations, presyncope or syncope. She has no PND or orthopnea.  Unfortunately she is still smoking cigarettes.  I she also has been bothered by a tooth infection and a sinus infection was recently treated for this. She has some chronic dyspnea on exertion but is not having any new shortness of breath, PND or orthopnea. She has had no weight gain or edema.  No Known Allergies  Current Outpatient Prescriptions  Medication Sig Dispense Refill  . acetaminophen (TYLENOL) 500 MG tablet Take 500 mg by mouth every 6 (six) hours as needed. For pain      . ALPRAZolam (XANAX) 0.25 MG tablet Take 0.25 mg by mouth 2 (two) times daily.       [name] amLODipine (NORVASC) 10 MG tablet Take 10 mg by mouth daily.        [name] aspirin 81 MG tablet Take 81 mg by mouth daily.        . calcium carbonate (OS-CAL) 600 MG TABS Take 600 mg by mouth 3 (three) times daily with meals.       . cloNIDine (CATAPRES) 0.1 MG tablet Take 0.1 mg by mouth 2 (two) times daily.      . famotidine (PEPCID) 20 MG tablet Take 1 tablet (20 mg total) by mouth 2 (two) times daily.  40 tablet  0  . fexofenadine (ALLEGRA) 180 MG tablet Take 1 tablet (180 mg total) by mouth daily.  14 tablet  0  . fish oil-omega-3 fatty acids 1000 MG capsule Take 1 g by mouth daily. Three times daily       . fluticasone (FLONASE) 50 MCG/ACT nasal spray Place 2  sprays into the nose daily.  16 g  0  . furosemide (LASIX) 80 MG tablet Take 80 mg by mouth daily.        [name] gemfibrozil (LOPID) 600 MG tablet Take 600 mg by mouth 2 (two) times daily before a meal.       . lisinopril (PRINIVIL,ZESTRIL) 40 MG tablet Take 40 mg by mouth daily.      . metFORMIN (GLUCOPHAGE) 1000 MG tablet Take 1,000 mg by mouth 2 (two) times daily with a meal.        . Multiple Vitamins-Minerals (MULTI FOR HER 50+ PO) Take by mouth. 1 tab daily       . pantoprazole (PROTONIX) 40 MG tablet Take 40 mg by mouth daily.        . potassium chloride (K-DUR) 10 MEQ tablet Take 10 mEq by mouth.        . rosuvastatin (CRESTOR) 40 MG tablet Take 40 mg by mouth daily.      . sertraline (ZOLOFT) 100 MG tablet Take 100 mg by mouth daily. 2 tabs daily      . albuterol (PROVENTIL HFA;VENTOLIN HFA) 108 (90 BASE) MCG/ACT inhaler Inhale 2 puffs into the lungs every 6 (six) hours as needed. For shortness of breath      .  Ferrous Sulfate (IRON) 325 (65 FE) MG TABS Take by mouth. 1 tab bid       . DISCONTD: loratadine (CLARITIN) 10 MG tablet Take 10 mg by mouth daily.        [name] DISCONTD: losartan (COZAAR) 100 MG tablet Take 100 mg by mouth daily.        [name] DISCONTD: metoprolol tartrate (LOPRESSOR) 25 MG tablet Take 25 mg by mouth daily.         Past Medical History  Diagnosis Date  . Peripheral vascular disease     status post left common iliac and external iliac artery stents  . Cerebrovascular disease   . Diverticulitis   . Diabetes mellitus   . COPD (chronic obstructive pulmonary disease)   . Hyperlipidemia   . Depression   . Diastolic congestive heart failure   . Hypertension   . Renal artery stenosis     status post  . CAD (coronary artery disease)     50% left main stenosis, ostial 60% LAD stenosis, 30% ramus intermediate stenosis, occluded right artery with left-to-right collaterals    Past Surgical History  Procedure Date  . Carotid endarterectomy   . Cholecystectomy   .  Endarterectomy   . Appendectomy   . Tonsillectomy   . Cesarean section   . Shoulder arthroscopy     ROS:  As stated in the HPI and negative for all other systems.  PHYSICAL EXAM BP 99/44  Pulse 68  Ht 4\' 9"  (1.448 m)  Wt 152 lb 1.9 oz (69.001 kg)  BMI 32.92 kg/m2 GENERAL:  Well appearing HEENT:  Pupils equal round and reactive, fundi not visualized, oral mucosa unremarkable, poor dentition NECK:  No jugular venous distention, waveform within normal limits, carotid upstroke brisk and symmetric, bilateral bruits, left CEA scar no thyromegaly LYMPHATICS:  No cervical, inguinal adenopathy LUNGS:  Clear to auscultation bilaterally BACK:  No CVA tenderness CHEST:  Unremarkable HEART:  PMI not displaced or sustained,S1 and S2 within normal limits, no S3, no S4, no clicks, no rubs, soft apical systolic murmur ABD:  Flat, positive bowel sounds normal in frequency in pitch, no bruits, no rebound, no guarding, no midline pulsatile mass, no hepatomegaly, no splenomegaly EXT:  2 plus pulses upper absent DP/PT, trace edema, no cyanosis no clubbing SKIN:  No rashes no nodules NEURO:  Cranial nerves II through XII grossly intact, motor grossly intact throughout PSYCH:  Cognitively intact, oriented to person place and time  EKG  sinus rhythm, rate 58, axis within normal limits, intervals within normal limits, no acute ST-T wave changes. 08/18/2011  ASSESSMENT AND PLAN

## 2011-08-18 NOTE — Assessment & Plan Note (Signed)
She is simply unable to quit smoking.

## 2011-08-18 NOTE — Patient Instructions (Addendum)
The current medical regimen is effective;  continue present plan and medications.  Your physician has requested that you have a lexiscan myoview. For further information please visit [email or web address] Please follow instruction sheet, as given.  Your physician has requested that you have a carotid duplex. This test is an ultrasound of the carotid arteries in your neck. It looks at blood flow through these arteries that supply the brain with blood. Allow one hour for this exam. There are no restrictions or special instructions.  Follow up after testing.

## 2011-08-30 ENCOUNTER — Encounter (INDEPENDENT_AMBULATORY_CARE_PROVIDER_SITE_OTHER): Payer: Self-pay

## 2011-08-30 DIAGNOSIS — I679 Cerebrovascular disease, unspecified: Secondary | ICD-10-CM

## 2011-08-30 DIAGNOSIS — I6529 Occlusion and stenosis of unspecified carotid artery: Secondary | ICD-10-CM

## 2011-08-31 ENCOUNTER — Ambulatory Visit (HOSPITAL_COMMUNITY): Payer: Self-pay | Attending: Cardiovascular Disease | Admitting: Radiology

## 2011-08-31 VITALS — BP 151/50 | Ht <= 58 in | Wt 152.0 lb

## 2011-08-31 DIAGNOSIS — R42 Dizziness and giddiness: Secondary | ICD-10-CM | POA: Insufficient documentation

## 2011-08-31 DIAGNOSIS — E785 Hyperlipidemia, unspecified: Secondary | ICD-10-CM | POA: Insufficient documentation

## 2011-08-31 DIAGNOSIS — I2510 Atherosclerotic heart disease of native coronary artery without angina pectoris: Secondary | ICD-10-CM

## 2011-08-31 DIAGNOSIS — R0609 Other forms of dyspnea: Secondary | ICD-10-CM | POA: Insufficient documentation

## 2011-08-31 DIAGNOSIS — I10 Essential (primary) hypertension: Secondary | ICD-10-CM | POA: Insufficient documentation

## 2011-08-31 DIAGNOSIS — I059 Rheumatic mitral valve disease, unspecified: Secondary | ICD-10-CM | POA: Insufficient documentation

## 2011-08-31 DIAGNOSIS — E119 Type 2 diabetes mellitus without complications: Secondary | ICD-10-CM

## 2011-08-31 DIAGNOSIS — Z8673 Personal history of transient ischemic attack (TIA), and cerebral infarction without residual deficits: Secondary | ICD-10-CM | POA: Insufficient documentation

## 2011-08-31 DIAGNOSIS — R000 Tachycardia, unspecified: Secondary | ICD-10-CM | POA: Insufficient documentation

## 2011-08-31 DIAGNOSIS — R0602 Shortness of breath: Secondary | ICD-10-CM | POA: Insufficient documentation

## 2011-08-31 DIAGNOSIS — I739 Peripheral vascular disease, unspecified: Secondary | ICD-10-CM | POA: Insufficient documentation

## 2011-08-31 DIAGNOSIS — I779 Disorder of arteries and arterioles, unspecified: Secondary | ICD-10-CM | POA: Insufficient documentation

## 2011-08-31 DIAGNOSIS — Z8249 Family history of ischemic heart disease and other diseases of the circulatory system: Secondary | ICD-10-CM | POA: Insufficient documentation

## 2011-08-31 DIAGNOSIS — F17200 Nicotine dependence, unspecified, uncomplicated: Secondary | ICD-10-CM | POA: Insufficient documentation

## 2011-08-31 DIAGNOSIS — R5381 Other malaise: Secondary | ICD-10-CM | POA: Insufficient documentation

## 2011-08-31 DIAGNOSIS — R0989 Other specified symptoms and signs involving the circulatory and respiratory systems: Secondary | ICD-10-CM | POA: Insufficient documentation

## 2011-08-31 MED ORDER — TECHNETIUM TC 99M TETROFOSMIN IV KIT
33.0000 | PACK | Freq: Once | INTRAVENOUS | Status: AC | PRN
  Administered 2011-08-31: 33 via INTRAVENOUS

## 2011-08-31 MED ORDER — TECHNETIUM TC 99M TETROFOSMIN IV KIT
11.0000 | PACK | Freq: Once | INTRAVENOUS | Status: AC | PRN
  Administered 2011-08-31: 11 via INTRAVENOUS

## 2011-08-31 MED ORDER — REGADENOSON 0.4 MG/5ML IV SOLN
0.4000 mg | Freq: Once | INTRAVENOUS | Status: AC
  Administered 2011-08-31: 0.4 mg via INTRAVENOUS

## 2011-08-31 NOTE — Progress Notes (Signed)
[facility] SITE 3 NUCLEAR MED [address] [place] [address] [phone]  Cardiology Nuclear Med Study  [name] is a 61 y.o. female     MRN : [id number]     DOB: Jul 03, 1950  Procedure Date: 08/31/2011  Nuclear Med Background Indication for Stress Test:  Evaluation for Ischemia History:  2010 Echo - mild MVR EF 60%, 2007- Heart Catheterization(Cfx-occluded, Lt. main 50%) EF 55%, 2002 MPS- Normal Cardiac Risk Factors: Carotid Disease, CVA, Family History - CAD, Hypertension, Lipids, PVD, Smoker and TIA  Symptoms:  Dizziness, DOE, Fatigue, Light-Headedness, Rapid HR and SOB   Nuclear Pre-Procedure Caffeine/Decaff Intake:  None NPO After: 7:00pm   Lungs:  clear O2 Sat: 97% on room air. IV 0.9% NS with Angio Cath:  20g  IV Site: R Antecubital  IV Started by:  Stanton Kidney, EMT-P  Chest Size (in):  44 Cup Size: D  Height: 4\' 9"  (1.448 m)  Weight:  152 lb (68.947 kg)  BMI:  Body mass index is 32.89 kg/(m^2). Tech Comments:  NA    Nuclear Med Study 1 or 2 day study: 1 day  Stress Test Type:  Lexiscan  Reading MD: Charlton Haws, MD  Order Authorizing Provider:  Rollene Rotunda, MD  Resting Radionuclide: Technetium 2m Tetrofosmin  Resting Radionuclide Dose: 10.6 mCi   Stress Radionuclide:  Technetium 47m Tetrofosmin  Stress Radionuclide Dose: 32.9 mCi           Stress Protocol Rest HR: 65 Stress HR: 96  Rest BP: 151/50 Stress BP: 152/51  Exercise Time (min): n/a METS: n/a   Predicted Max HR: 159 bpm % Max HR: 62.89 bpm Rate Pressure Product: 86578   Dose of Adenosine (mg):  n/a Dose of Lexiscan: 0.4 mg  Dose of Atropine (mg): n/a Dose of Dobutamine: n/a mcg/kg/min (at max HR)  Stress Test Technologist: Bonnita Levan, RN  Nuclear Technologist:  Domenic Polite, CNMT     Rest Procedure:  Myocardial perfusion imaging was performed at rest 45 minutes following the intravenous administration of Technetium 62m Tetrofosmin. Rest ECG: Sinus Rhythm with T  wave abnormality  Stress Procedure:  The patient received IV Lexiscan 0.4 mg over 15-seconds.  Technetium 87m Tetrofosmin injected at 30-seconds.  There were non specific ST-T changes with infusion. Occ. PVC noted. Patient denied any CP.  Quantitative spect images were obtained after a 45 minute delay. Stress ECG: No significant change from baseline ECG  QPS Raw Data Images:  Normal; no motion artifact; normal heart/lung ratio. Stress Images:  Normal homogeneous uptake in all areas of the myocardium. Rest Images:  Normal homogeneous uptake in all areas of the myocardium. Subtraction (SDS):  Normal Transient Ischemic Dilatation (Normal <1.22):  1.27 Lung/Heart Ratio (Normal <0.45):  0.39  Quantitative Gated Spect Images QGS EDV:  79 ml QGS ESV:  24 ml  Impression Exercise Capacity:  Lexiscan with no exercise. BP Response:  Normal blood pressure response. Clinical Symptoms:  There is dyspnea. ECG Impression:  No significant ST segment change suggestive of ischemia. Comparison with Prior Nuclear Study: No images to compare  Overall Impression:  Normal stress nuclear study.  LV Ejection Fraction: 69%.  LV Wall Motion:  NL LV Function; NL Wall Motion  Charlton Haws

## 2011-09-06 ENCOUNTER — Ambulatory Visit (INDEPENDENT_AMBULATORY_CARE_PROVIDER_SITE_OTHER): Payer: Self-pay | Admitting: Physician Assistant

## 2011-09-06 ENCOUNTER — Encounter: Payer: Self-pay | Admitting: Physician Assistant

## 2011-09-06 VITALS — BP 115/46 | HR 79 | Ht <= 58 in | Wt 150.0 lb

## 2011-09-06 DIAGNOSIS — F17200 Nicotine dependence, unspecified, uncomplicated: Secondary | ICD-10-CM

## 2011-09-06 DIAGNOSIS — I679 Cerebrovascular disease, unspecified: Secondary | ICD-10-CM

## 2011-09-06 DIAGNOSIS — I2510 Atherosclerotic heart disease of native coronary artery without angina pectoris: Secondary | ICD-10-CM

## 2011-09-06 DIAGNOSIS — I509 Heart failure, unspecified: Secondary | ICD-10-CM

## 2011-09-06 DIAGNOSIS — I10 Essential (primary) hypertension: Secondary | ICD-10-CM

## 2011-09-06 DIAGNOSIS — E785 Hyperlipidemia, unspecified: Secondary | ICD-10-CM

## 2011-09-06 NOTE — Patient Instructions (Addendum)
Your physician wants you to follow-up in: 6 MONTHS WITH DR. HOCHREIN. You will receive a reminder letter in the mail two months in advance. If you don't receive a letter, please call our office to schedule the follow-up appointment.    Your physician has requested that you have a carotid duplex THIS WILL BE DONE 02/2012. This test is an ultrasound of the carotid arteries in your neck. It looks at blood flow through these arteries that supply the brain with blood. Allow one hour for this exam. There are no restrictions or special instructions.  NO CHANGES WERE MADE TODAY

## 2011-09-06 NOTE — Progress Notes (Signed)
[address] [place], [place]  [address] Phone: [phone] Fax:  [phone]  Date:  09/06/2011   Name:  [name]   DOB:  1951/01/08   MRN:  [id number]  PCP:  Norberto Sorenson, MD  Primary Cardiologist:  Dr. Rollene Rotunda  Primary Electrophysiologist:  None    History of Present Illness: [name] is a 61 y.o. female who returns for follow up.  She has a history of CAD, PAD, HTN, diastolic CHF, HL, COPD, DM2, s/p prior RA stenting, s/p L CEA.  LHC 07/2005: LM 50% extending into the ostial LAD, ostial LAD 60%, RI 30%, superior branch 40%, mid RCA occluded with left to right collaterals with scant refill of the distal vessel, EF 55%, medical therapy.  Last echo 05/2008: EF 60%, mild MR, mild LAE.  She was last seen by Dr. Antoine Poche 07/2011.  She was set up for a nuclear stress test as well as follow up on her carotid Dopplers.  Myoview 08/31/11: Normal, EF 69%.  Carotid Dopplers 08/30/11: Bilateral ICA 60-79% (followup in 6 months).  Doing well.  The patient denies chest pain, shortness of breath, syncope, orthopnea, PND or significant pedal edema.   Past Medical History  Diagnosis Date  . Peripheral vascular disease     status post left common iliac and external iliac artery stents  . Cerebrovascular disease   . Diverticulitis   . Diabetes mellitus   . COPD (chronic obstructive pulmonary disease)   . Hyperlipidemia   . Depression   . Diastolic congestive heart failure   . Hypertension   . Renal artery stenosis     status post  . CAD (coronary artery disease)     50% left main stenosis, ostial 60% LAD stenosis, 30% ramus intermediate stenosis, occluded right artery with left-to-right collaterals    Current Outpatient Prescriptions  Medication Sig Dispense Refill  . acetaminophen (TYLENOL) 500 MG tablet Take 500 mg by mouth every 6 (six) hours as needed. For pain      . albuterol (PROVENTIL HFA;VENTOLIN HFA) 108 (90 BASE) MCG/ACT inhaler Inhale 2 puffs  into the lungs every 6 (six) hours as needed. For shortness of breath      . ALPRAZolam (XANAX) 0.25 MG tablet Take 0.25 mg by mouth 2 (two) times daily.       [name] amLODipine (NORVASC) 10 MG tablet Take 10 mg by mouth daily.        [name] aspirin 81 MG tablet Take 81 mg by mouth daily.        . calcium carbonate (OS-CAL) 600 MG TABS Take 600 mg by mouth 3 (three) times daily with meals.       . cloNIDine (CATAPRES) 0.1 MG tablet Take 0.1 mg by mouth 2 (two) times daily.      . famotidine (PEPCID) 20 MG tablet Take 1 tablet (20 mg total) by mouth 2 (two) times daily.  40 tablet  0  . Ferrous Sulfate (IRON) 325 (65 FE) MG TABS Take by mouth. 1 tab bid       . fexofenadine (ALLEGRA) 180 MG tablet Take 1 tablet (180 mg total) by mouth daily.  14 tablet  0  . fish oil-omega-3 fatty acids 1000 MG capsule Take 1 g by mouth daily. Three times daily       . fluticasone (FLONASE) 50 MCG/ACT nasal spray Place 2 sprays into the nose daily.  16 g  0  . furosemide (LASIX) 80 MG tablet Take  80 mg by mouth daily.        [name] gemfibrozil (LOPID) 600 MG tablet Take 600 mg by mouth 2 (two) times daily before a meal.       . lisinopril (PRINIVIL,ZESTRIL) 40 MG tablet Take 40 mg by mouth daily.      . metFORMIN (GLUCOPHAGE) 1000 MG tablet Take 1,000 mg by mouth 2 (two) times daily with a meal.        . Multiple Vitamins-Minerals (MULTI FOR HER 50+ PO) Take by mouth. 1 tab daily       . pantoprazole (PROTONIX) 40 MG tablet Take 40 mg by mouth daily.        . potassium chloride (K-DUR) 10 MEQ tablet Take 10 mEq by mouth.        . rosuvastatin (CRESTOR) 40 MG tablet Take 40 mg by mouth daily.      . sertraline (ZOLOFT) 100 MG tablet Take 100 mg by mouth daily. 2 tabs daily      . DISCONTD: loratadine (CLARITIN) 10 MG tablet Take 10 mg by mouth daily.        [name] DISCONTD: losartan (COZAAR) 100 MG tablet Take 100 mg by mouth daily.        [name] DISCONTD: metoprolol tartrate (LOPRESSOR) 25 MG tablet Take 25 mg by mouth daily.          Allergies: No Known Allergies  History  Substance Use Topics  . Smoking status: Current Everyday Smoker  . Smokeless tobacco: Not on file  . Alcohol Use: No     PHYSICAL EXAM: VS:  BP 115/46  Pulse 79  Ht 4\' 9"  (1.448 m)  Wt 150 lb (68.04 kg)  BMI 32.46 kg/m2 Well nourished, well developed, in no acute distress HEENT: normal Neck: no JVD Vascular: L CEA scar noted Cardiac:  normal S1, S2; RRR; 2/6 systolic murmur along LSB Lungs:  Decreased breath sounds bilaterally with faint exp wheezes Abd: soft, nontender, no hepatomegaly Ext: trace bilateral LE edema Skin: warm and dry Neuro:  CNs 2-12 intact, no focal abnormalities noted  EKG:  Sinus rhythm, heart rate 71, rightward axis, nonspecific ST-T changes, no change from prior tracing      ASSESSMENT AND PLAN:  1.  CAD Stable.  No angina.  Recent low risk Myoview.  Continue aspirin and statin.  Followup in 6 months.  2.  Carotid artery stenosis Bilateral moderate stenosis.  Arrange followup carotid Dopplers in January 2014.  Continue aspirin.  3.  Hypertension Controlled.  Continue current therapy.   4.  Hyperlipidemia Managed by PCP.   5.  Tobacco abuse We discussed the importance of cessation and different strategies for quitting.     Signed, Tereso Newcomer, PA-C  2:12 PM 09/06/2011

## 2011-10-17 ENCOUNTER — Telehealth: Payer: Self-pay | Admitting: *Deleted

## 2011-10-17 NOTE — Telephone Encounter (Signed)
Pt called and left message on voice mail re:  Pt felt like her iron is low again.   Spoke with pt and was informed that pt was feeling tired for last 2 weeks.  Pt did not have much energy to do activities.   Pt wanted to know if she could come in for lab checked and receive iron infusion.   Pt stated she used to go to [facility] for medical issue;  Pt does not have primary now since the clinic is closed.   Informed pt that message will be relayed to md for 10/18/11.   Pt voiced understanding. Pt's   Phone    [phone].

## 2011-10-18 ENCOUNTER — Ambulatory Visit (HOSPITAL_BASED_OUTPATIENT_CLINIC_OR_DEPARTMENT_OTHER): Payer: Self-pay | Admitting: Lab

## 2011-10-18 ENCOUNTER — Ambulatory Visit: Payer: Self-pay | Admitting: Family

## 2011-10-18 ENCOUNTER — Telehealth: Payer: Self-pay | Admitting: Hematology and Oncology

## 2011-10-18 ENCOUNTER — Other Ambulatory Visit: Payer: Self-pay

## 2011-10-18 ENCOUNTER — Telehealth: Payer: Self-pay

## 2011-10-18 DIAGNOSIS — D539 Nutritional anemia, unspecified: Secondary | ICD-10-CM

## 2011-10-18 LAB — CBC WITH DIFFERENTIAL/PLATELET
Basophils Absolute: 0.1 10*3/uL (ref 0.0–0.1)
EOS%: 1.5 % (ref 0.0–7.0)
HCT: 21.3 % — ABNORMAL LOW (ref 34.8–46.6)
HGB: 7 g/dL — ABNORMAL LOW (ref 11.6–15.9)
MCH: 31.8 pg (ref 25.1–34.0)
MCV: 96.2 fL (ref 79.5–101.0)
MONO%: 9.7 % (ref 0.0–14.0)
NEUT%: 69.4 % (ref 38.4–76.8)
lymph#: 1.4 10*3/uL (ref 0.9–3.3)

## 2011-10-18 LAB — FERRITIN: Ferritin: 26 ng/mL (ref 10–291)

## 2011-10-18 LAB — IRON AND TIBC: TIBC: 516 ug/dL — ABNORMAL HIGH (ref 250–470)

## 2011-10-18 NOTE — Telephone Encounter (Signed)
S/w pt that Dr Dalene Carrow could not be her PCP, We can see pt tomorrow if she comes in for labs today. Pt expressed agreement and said she could do labs about 130. S/w Melissa scheduler about POF.

## 2011-10-18 NOTE — Telephone Encounter (Signed)
S/w pt re appts for today 8/27 and tomorrow 8/28.

## 2011-10-18 NOTE — Telephone Encounter (Signed)
S/w pt that hgb was 7.0. Pt stated she had some shortness of breath but not real bad. I told her per Dr Dalene Carrow if it gets any worse to go to ER. She has an appt 8/28 with NP Norina Buzzard. Pt restated both comments.

## 2011-10-19 ENCOUNTER — Telehealth: Payer: Self-pay | Admitting: Hematology and Oncology

## 2011-10-19 ENCOUNTER — Encounter: Payer: Self-pay | Admitting: Family

## 2011-10-19 ENCOUNTER — Ambulatory Visit (HOSPITAL_BASED_OUTPATIENT_CLINIC_OR_DEPARTMENT_OTHER): Payer: Self-pay | Admitting: Family

## 2011-10-19 ENCOUNTER — Ambulatory Visit (HOSPITAL_BASED_OUTPATIENT_CLINIC_OR_DEPARTMENT_OTHER): Payer: Self-pay

## 2011-10-19 VITALS — BP 127/54 | HR 88 | Temp 96.6°F | Resp 20 | Ht <= 58 in | Wt 148.0 lb

## 2011-10-19 DIAGNOSIS — D539 Nutritional anemia, unspecified: Secondary | ICD-10-CM

## 2011-10-19 DIAGNOSIS — D509 Iron deficiency anemia, unspecified: Secondary | ICD-10-CM

## 2011-10-19 DIAGNOSIS — D638 Anemia in other chronic diseases classified elsewhere: Secondary | ICD-10-CM

## 2011-10-19 MED ORDER — SODIUM CHLORIDE 0.9 % IV SOLN: Freq: Once | INTRAVENOUS | Status: DC

## 2011-10-19 MED ORDER — SODIUM CHLORIDE 0.9 % IV SOLN
1020.0000 mg | Freq: Once | INTRAVENOUS | Status: AC
  Administered 2011-10-19: 1020 mg via INTRAVENOUS
  Filled 2011-10-19: qty 34

## 2011-10-19 NOTE — Telephone Encounter (Signed)
appts made and printed for pt aom °

## 2011-10-19 NOTE — Progress Notes (Signed)
Patient ID: [name], female   DOB: 21-May-1950, 61 y.o.   MRN: [id number] CSN: [id number]  CC: Norberto Sorenson, MD  [name]. Hyacinth Meeker, M.D.  Wilhemina Bonito. Marina Goodell, MD  Identifying Statement: [name] is a 61 y.o. Caucasian female with anemia who presents for follow-up.   Interval History: The patient was last seen in our office on April 06, 2011.  The patient is here today with her husband [name].  Since the patient was seen in our office, she received a Feraheme infusion on April 11, 2011.  The patient also had a negative nuclear cardiac stress test on August 31, 2011.  The patient states that she has been fatigued, but denies any other complaints.  Specifically the patient denies SOB, dizziness, fever, night sweats, N/V/D and any unusual bleeding/bruising.  The patient states that she does have occasional chills and is sometimes constipated.  Spoke to the patient about hydration, fiber and possibly Miralax for the constipation.  The patient stated that she had a colonoscopy about 2 years ago and was told to return in 10 years.  The patient continues to smoke 1 pack of cigarettes daily.  Smoking cessation was discussed with the patient.  The patient's iron level is at 26 down from  43 in February, 2013.  The patient will receive a Feraheme infusion today.  The patient denies any other symptomatology.  Medications: Current Outpatient Prescriptions  Medication Sig Dispense Refill  . acetaminophen (TYLENOL) 500 MG tablet Take 500 mg by mouth every 6 (six) hours as needed. For pain      . albuterol (PROVENTIL HFA;VENTOLIN HFA) 108 (90 BASE) MCG/ACT inhaler Inhale 2 puffs into the lungs every 6 (six) hours as needed. For shortness of breath      . ALPRAZolam (XANAX) 0.25 MG tablet Take 0.25 mg by mouth 2 (two) times daily.       [name] amLODipine (NORVASC) 10 MG tablet Take 10 mg by mouth daily.        [name] aspirin 81 MG tablet Take 81 mg by mouth daily.        . calcium carbonate (OS-CAL) 600 MG TABS Take 600  mg by mouth 3 (three) times daily with meals.       . cloNIDine (CATAPRES) 0.1 MG tablet Take 0.1 mg by mouth 2 (two) times daily.      . famotidine (PEPCID) 20 MG tablet Take 1 tablet (20 mg total) by mouth 2 (two) times daily.  40 tablet  0  . Ferrous Sulfate (IRON) 325 (65 FE) MG TABS Take by mouth. 1 tab bid       . fexofenadine (ALLEGRA) 180 MG tablet Take 1 tablet (180 mg total) by mouth daily.  14 tablet  0  . fish oil-omega-3 fatty acids 1000 MG capsule Take 1 g by mouth daily. Three times daily       . fluticasone (FLONASE) 50 MCG/ACT nasal spray Place 2 sprays into the nose daily.  16 g  0  . furosemide (LASIX) 80 MG tablet Take 80 mg by mouth daily.        [name] gemfibrozil (LOPID) 600 MG tablet Take 600 mg by mouth 2 (two) times daily before a meal.       . lisinopril (PRINIVIL,ZESTRIL) 40 MG tablet Take 40 mg by mouth daily.      . metFORMIN (GLUCOPHAGE) 1000 MG tablet Take 1,000 mg by mouth 2 (two) times daily with a meal.        .  Multiple Vitamins-Minerals (MULTI FOR HER 50+ PO) Take by mouth. 1 tab daily       . pantoprazole (PROTONIX) 40 MG tablet Take 40 mg by mouth daily.        . potassium chloride (K-DUR) 10 MEQ tablet Take 10 mEq by mouth.        . rosuvastatin (CRESTOR) 40 MG tablet Take 40 mg by mouth daily.      . sertraline (ZOLOFT) 100 MG tablet Take 100 mg by mouth daily. 2 tabs daily      . DISCONTD: loratadine (CLARITIN) 10 MG tablet Take 10 mg by mouth daily.        [name] DISCONTD: losartan (COZAAR) 100 MG tablet Take 100 mg by mouth daily.        [name] DISCONTD: metoprolol tartrate (LOPRESSOR) 25 MG tablet Take 25 mg by mouth daily.         Allergies: No Known Allergies   Family History: No family history on file.  Social History: History  Substance Use Topics  . Smoking status: Current Everyday Smoker  . Smokeless tobacco: Never Used  . Alcohol Use: No    Review of Systems: 10 point review of systems was completed and is negative except as noted  above.   Physical Exam: Blood pressure 127/54, pulse 88, temperature 96.6 F (35.9 C), temperature source Oral, resp. rate 20, height 4\' 9"  (1.448 m), weight 148 lb (67.132 kg).  General appearance: Alert, cooperative, well nourished, no apparent distress Head: Normocephalic, without obvious abnormality, atraumatic Eyes: Conjunctivae clear, PERRLA, EOMI    Nose: Nares, septum and mucosa are normal, no drainage or sinus tenderness Neck: Cervical adenopathy bilaterally, supple, symmetrical, trachea midline, no tenderness Resp: Clear to auscultation bilaterally, diminished bibasilar breath sounds Cardio: Regular rate and rhythm, S1, S2 normal, no murmur, click, rub or gallop GI: Soft, non-tender, distended, hypoactive bowel sounds, no organomegaly Extremities: Extremities normal, atraumatic, no cyanosis or edema Neurologic: Grossly normal   Laboratory Data: Results for orders placed in visit on 10/18/11 (from the past 48 hour(s))  CBC WITH DIFFERENTIAL     Status: Abnormal   Collection Time   10/18/11  1:45 PM      Component Value Range Comment   WBC 7.9  3.9 - 10.3 10e3/uL    NEUT# 5.5  1.5 - 6.5 10e3/uL    HGB 7.0 (*) 11.6 - 15.9 g/dL    HCT 16.1 (*) 09.6 - 46.6 %    Platelets 296  145 - 400 10e3/uL    MCV 96.2  79.5 - 101.0 fL    MCH 31.8  25.1 - 34.0 pg    MCHC 33.0  31.5 - 36.0 g/dL    RBC 0.45 (*) 4.09 - 5.45 10e6/uL    RDW 14.8 (*) 11.2 - 14.5 %    lymph# 1.4  0.9 - 3.3 10e3/uL    MONO# 0.8  0.1 - 0.9 10e3/uL    Eosinophils Absolute 0.1  0.0 - 0.5 10e3/uL    Basophils Absolute 0.1  0.0 - 0.1 10e3/uL    NEUT% 69.4  38.4 - 76.8 %    LYMPH% 18.2  14.0 - 49.7 %    MONO% 9.7  0.0 - 14.0 %    EOS% 1.5  0.0 - 7.0 %    BASO% 1.2  0.0 - 2.0 %   IRON AND TIBC     Status: Abnormal   Collection Time   10/18/11  1:45 PM      Component Value  Range Comment   Iron 17 (*) 42 - 145 ug/dL    UIBC 782 (*) 956 - 213 ug/dL    TIBC 086 (*) 578 - 469 ug/dL    %SAT 3 (*) 20 - 55 %    FERRITIN     Status: Normal   Collection Time   10/18/11  1:45 PM      Component Value Range Comment   Ferritin 26  10 - 291 ng/mL      Impression/Plan: Mrs. Hakim is a 61 year old woman with a history of iron-deficiency anemia and anemia of chronic disease.  The patient also has a history of diabetes, arteriovenous malformation and GI bleeding.  The patient will receive a Feraheme infusion today and she is  urged her to continue taking PO ferrous sulfate TID. Smoking cessation was also encouraged.  The patient was advised to diligently work toward finding a PCP.  Mrs. Pounds was advised to go to the ER if she is SOB. The patient was also advised to follow-up with Dr. Marina Goodell, Gastroenterologist.  She will follow-up with [place] in 6 weeks' time with the laboratories of CBC, BMP, Iron/TIBC and Ferritin to be check a few days in advance of her office appointment.  The patient will contact us in the interim if she has any questions or concerns.  The plan of care for Mrs. Klinger was discussed with Dr. Dalene Carrow.   Dell Hurtubise NP-C 10/19/2011, 10:01 AM

## 2011-10-19 NOTE — Patient Instructions (Signed)
Patient ID: [name],   DOB: 1950-09-04,  MRN: [id number]   [facility] Discharge Instructions  RECOMMENDATIONS MAD BY THE CONSULTANT AND ANY TEST RESULT(S) WILL BE FORWARDED TO YOU REFERRING DOCTOR   EXAM FINDINGS BY NURSE PRACTITIONER TODAY TO REPORT TO THE CLINIC OR PRIMARY PROVIDER: FIND A Primary Care Physician soon please!!!   Your Current Medications Are: Current Outpatient Prescriptions  Medication Sig Dispense Refill  . acetaminophen (TYLENOL) 500 MG tablet Take 500 mg by mouth every 6 (six) hours as needed. For pain      . albuterol (PROVENTIL HFA;VENTOLIN HFA) 108 (90 BASE) MCG/ACT inhaler Inhale 2 puffs into the lungs every 6 (six) hours as needed. For shortness of breath      . ALPRAZolam (XANAX) 0.25 MG tablet Take 0.25 mg by mouth 2 (two) times daily.       [name] amLODipine (NORVASC) 10 MG tablet Take 10 mg by mouth daily.        [name] aspirin 81 MG tablet Take 81 mg by mouth daily.        . calcium carbonate (OS-CAL) 600 MG TABS Take 600 mg by mouth 3 (three) times daily with meals.       . cloNIDine (CATAPRES) 0.1 MG tablet Take 0.1 mg by mouth 2 (two) times daily.      . famotidine (PEPCID) 20 MG tablet Take 1 tablet (20 mg total) by mouth 2 (two) times daily.  40 tablet  0  . Ferrous Sulfate (IRON) 325 (65 FE) MG TABS Take by mouth. 1 tab bid       . fexofenadine (ALLEGRA) 180 MG tablet Take 1 tablet (180 mg total) by mouth daily.  14 tablet  0  . fish oil-omega-3 fatty acids 1000 MG capsule Take 1 g by mouth daily. Three times daily       . fluticasone (FLONASE) 50 MCG/ACT nasal spray Place 2 sprays into the nose daily.  16 g  0  . furosemide (LASIX) 80 MG tablet Take 80 mg by mouth daily.        [name] gemfibrozil (LOPID) 600 MG tablet Take 600 mg by mouth 2 (two) times daily before a meal.       . lisinopril (PRINIVIL,ZESTRIL) 40 MG tablet Take 40 mg by mouth daily.      . metFORMIN (GLUCOPHAGE) 1000 MG tablet Take 1,000 mg by mouth 2 (two) times daily with a  meal.        . Multiple Vitamins-Minerals (MULTI FOR HER 50+ PO) Take by mouth. 1 tab daily       . pantoprazole (PROTONIX) 40 MG tablet Take 40 mg by mouth daily.        . potassium chloride (K-DUR) 10 MEQ tablet Take 10 mEq by mouth.        . rosuvastatin (CRESTOR) 40 MG tablet Take 40 mg by mouth daily.      . sertraline (ZOLOFT) 100 MG tablet Take 100 mg by mouth daily. 2 tabs daily      . DISCONTD: loratadine (CLARITIN) 10 MG tablet Take 10 mg by mouth daily.        [name] DISCONTD: losartan (COZAAR) 100 MG tablet Take 100 mg by mouth daily.        [name] DISCONTD: metoprolol tartrate (LOPRESSOR) 25 MG tablet Take 25 mg by mouth daily.          INSTRUCTIONS GIVEN, DISCUSSED AND FOLLOW-UP: Think about quitting smoking. If you get short of breath  go to the ER.  I acknowledge that I have been informed and understand all the instructions given to me and have received a copy.  I do not have any further questions at this time, but I understand that I may call the [facility] at [phone] during business hours should I have any further questions or need assistance in obtaining follow-up care.   10/19/2011, 10:37 AM

## 2011-10-29 ENCOUNTER — Emergency Department (HOSPITAL_COMMUNITY)
Admission: EM | Admit: 2011-10-29 | Discharge: 2011-10-29 | Disposition: A | Discharge status: Home / Self Care | Payer: Self-pay | Attending: Emergency Medicine | Admitting: Emergency Medicine

## 2011-10-29 ENCOUNTER — Emergency Department (HOSPITAL_COMMUNITY): Payer: Self-pay

## 2011-10-29 ENCOUNTER — Encounter (HOSPITAL_COMMUNITY): Payer: Self-pay | Admitting: Emergency Medicine

## 2011-10-29 DIAGNOSIS — J449 Chronic obstructive pulmonary disease, unspecified: Secondary | ICD-10-CM | POA: Insufficient documentation

## 2011-10-29 DIAGNOSIS — E119 Type 2 diabetes mellitus without complications: Secondary | ICD-10-CM | POA: Insufficient documentation

## 2011-10-29 DIAGNOSIS — R0989 Other specified symptoms and signs involving the circulatory and respiratory systems: Secondary | ICD-10-CM | POA: Insufficient documentation

## 2011-10-29 DIAGNOSIS — I10 Essential (primary) hypertension: Secondary | ICD-10-CM | POA: Insufficient documentation

## 2011-10-29 DIAGNOSIS — R0602 Shortness of breath: Secondary | ICD-10-CM | POA: Insufficient documentation

## 2011-10-29 DIAGNOSIS — D649 Anemia, unspecified: Secondary | ICD-10-CM | POA: Insufficient documentation

## 2011-10-29 DIAGNOSIS — J4489 Other specified chronic obstructive pulmonary disease: Secondary | ICD-10-CM | POA: Insufficient documentation

## 2011-10-29 DIAGNOSIS — I509 Heart failure, unspecified: Secondary | ICD-10-CM | POA: Insufficient documentation

## 2011-10-29 DIAGNOSIS — F17200 Nicotine dependence, unspecified, uncomplicated: Secondary | ICD-10-CM | POA: Insufficient documentation

## 2011-10-29 HISTORY — DX: Iron deficiency anemia, unspecified: D50.9

## 2011-10-29 LAB — COMPREHENSIVE METABOLIC PANEL
ALT: 10 U/L (ref 0–35)
AST: 19 U/L (ref 0–37)
Albumin: 3.6 g/dL (ref 3.5–5.2)
Alkaline Phosphatase: 78 U/L (ref 39–117)
CO2: 28 mEq/L (ref 19–32)
Chloride: 96 mEq/L (ref 96–112)
GFR calc non Af Amer: 42 mL/min — ABNORMAL LOW (ref >90)
Potassium: 4.2 mEq/L (ref 3.5–5.1)
Total Bilirubin: 0.1 mg/dL — ABNORMAL LOW (ref 0.3–1.2)

## 2011-10-29 LAB — CBC WITH DIFFERENTIAL/PLATELET
Basophils Absolute: 0.1 10*3/uL (ref 0.0–0.1)
Basophils Relative: 1 % (ref 0–1)
HCT: 26.8 % — ABNORMAL LOW (ref 36.0–46.0)
Hemoglobin: 8.3 g/dL — ABNORMAL LOW (ref 12.0–15.0)
Lymphocytes Relative: 14 % (ref 12–46)
MCHC: 31 g/dL (ref 30.0–36.0)
Monocytes Absolute: 0.6 10*3/uL (ref 0.1–1.0)
Neutro Abs: 7 10*3/uL (ref 1.7–7.7)
Neutrophils Relative %: 78 % — ABNORMAL HIGH (ref 43–77)
RDW: 19.7 % — ABNORMAL HIGH (ref 11.5–15.5)
WBC: 8.9 10*3/uL (ref 4.0–10.5)

## 2011-10-29 NOTE — ED Provider Notes (Signed)
History     CSN: [id number]  Arrival date & time 10/29/11  1308   First MD Initiated Contact with Patient 10/29/11 0945      Chief Complaint  Patient presents with  . Shortness of Breath  . Melena    stools are black and dark    (Consider location/radiation/quality/duration/timing/severity/associated sxs/prior treatment) HPI This 61 year old female has a history of chronic anemia for which she receives iron infusions every several months, she also takes iron orally and has chronic black stools from taking iron, she also has asthma and COPD and still smokes but is not on oxygen at home, she apparently had an unremarkable cardiac stress Myoview study within the last few months, she now presents with a few days of gradual onset cough some mild shortness of breath gradually worsening over the last few days, there's been no fever or confusion, she is no chest pain or abdominal pain, she is no change in her chronically black stools, she is no edema, there is no treatment prior to arrival, she does not feel like she is wheezing. There is no treatment prior to arrival. Her shortness of breath is mild and worse with activity. Past Medical History  Diagnosis Date  . Peripheral vascular disease     status post left common iliac and external iliac artery stents  . Cerebrovascular disease   . Diverticulitis   . Diabetes mellitus   . COPD (chronic obstructive pulmonary disease)   . Hyperlipidemia   . Depression   . Diastolic congestive heart failure   . Hypertension   . Renal artery stenosis     status post  . CAD (coronary artery disease)     50% left main stenosis, ostial 60% LAD stenosis, 30% ramus intermediate stenosis, occluded right artery with left-to-right collaterals  . Iron deficiency anemia     Past Surgical History  Procedure Date  . Carotid endarterectomy   . Cholecystectomy   . Endarterectomy   . Appendectomy   . Tonsillectomy   . Cesarean section   . Shoulder arthroscopy      No family history on file.  History  Substance Use Topics  . Smoking status: Current Everyday Smoker -- 1.0 packs/day for 36 years    Types: Cigarettes  . Smokeless tobacco: Never Used  . Alcohol Use: No    OB History    Grav Para Term Preterm Abortions TAB SAB Ect Mult Living                  Review of Systems 10 Systems reviewed and are negative for acute change except as noted in the HPI. Allergies  Review of patient's allergies indicates no known allergies.  Home Medications   Current Outpatient Rx  Name Route Sig Dispense Refill  . ACETAMINOPHEN 500 MG PO TABS Oral Take 500 mg by mouth every 6 (six) hours as needed. For pain    . ALBUTEROL SULFATE HFA 108 (90 BASE) MCG/ACT IN AERS Inhalation Inhale 2 puffs into the lungs every 6 (six) hours as needed. For shortness of breath    . ALPRAZOLAM 0.25 MG PO TABS Oral Take 0.25 mg by mouth 2 (two) times daily.     [name] AMLODIPINE BESYLATE 10 MG PO TABS Oral Take 10 mg by mouth daily.     . ASPIRIN 81 MG PO TABS Oral Take 81 mg by mouth daily.     [name] CALCIUM CARBONATE 600 MG PO TABS Oral Take 600 mg by mouth 3 (three) times  daily with meals.     [name] CLONIDINE HCL 0.1 MG PO TABS Oral Take 0.1 mg by mouth 2 (two) times daily.    [name] FAMOTIDINE 20 MG PO TABS Oral Take 20 mg by mouth 2 (two) times daily.    . IRON 325 (65 FE) MG PO TABS Oral Take 1 tablet by mouth 3 (three) times daily.     [name] FEXOFENADINE HCL 180 MG PO TABS Oral Take 180 mg by mouth daily as needed. For allergies    . OMEGA-3 FATTY ACIDS 1000 MG PO CAPS Oral Take 1 g by mouth daily. Three times daily     . FLUTICASONE PROPIONATE 50 MCG/ACT NA SUSP Nasal Place 2 sprays into the nose daily as needed. For allergies    . FUROSEMIDE 80 MG PO TABS Oral Take 80 mg by mouth daily.      [name] GEMFIBROZIL 600 MG PO TABS Oral Take 600 mg by mouth 2 (two) times daily before a meal.     . LISINOPRIL 40 MG PO TABS Oral Take 40 mg by mouth daily.    [name] METFORMIN HCL 1000 MG PO TABS Oral  Take 1,000 mg by mouth 2 (two) times daily with a meal.      . ADULT MULTIVITAMIN W/MINERALS CH Oral Take 1 tablet by mouth daily.    [name] PANTOPRAZOLE SODIUM 40 MG PO TBEC Oral Take 40 mg by mouth daily.      [name] POTASSIUM CHLORIDE ER 10 MEQ PO TBCR Oral Take 10 mEq by mouth.      . ROSUVASTATIN CALCIUM 40 MG PO TABS Oral Take 40 mg by mouth every evening.     [name] SERTRALINE HCL 100 MG PO TABS Oral Take 200 mg by mouth daily.       BP 110/76  Pulse 71  Temp 98.7 F (37.1 C) (Oral)  Resp 14  SpO2 96%  Physical Exam  Nursing note and vitals reviewed. Constitutional:       Awake, alert, nontoxic appearance.  HENT:  Head: Atraumatic.  Eyes: Right eye exhibits no discharge. Left eye exhibits no discharge.  Neck: Neck supple.  Cardiovascular: Normal rate and regular rhythm.   No murmur heard. Pulmonary/Chest: Effort normal. No respiratory distress. She has no wheezes. She has rales. She exhibits no tenderness.       Isolated crackles to the posterior right base lung fields; she is able to speak full sentences at rest without respiratory distress.  Abdominal: Soft. Bowel sounds are normal. She exhibits no mass. There is no tenderness. There is no rebound and no guarding.  Genitourinary: Guaiac negative stool.       Chaperone is present for rectal examination with black formed stool which is Hemoccult negative.  Musculoskeletal: She exhibits no tenderness.       Baseline ROM, no obvious new focal weakness.  Neurological: She is alert.       Mental status and motor strength appears baseline for patient and situation.  Skin: No rash noted.  Psychiatric: She has a normal mood and affect.    ED Course  Procedures (including critical care time) ECG: Sinus rhythm, ventricular rate 68, right axis deviation, inferior lateral ST and T changes which are not significantly changed from July 2013  The patient offers Lasix in the IV here in the emergency room and that she is amenable to taking extra  dose of Lasix at home today and tomorrow and following up with her regular doctor since she does not  appear to be an emergency blood transfusion today. her systolic blood pressure is 119 and her pulse oximetry in room air is normal at 95%. 1425 Labs Reviewed  CBC WITH DIFFERENTIAL - Abnormal; Notable for the following:    RBC 2.57 (*)     Hemoglobin 8.3 (*)     HCT 26.8 (*)     MCV 104.3 (*)     RDW 19.7 (*)     Neutrophils Relative 78 (*)     All other components within normal limits  COMPREHENSIVE METABOLIC PANEL - Abnormal; Notable for the following:    Glucose, Bld 113 (*)     BUN 49 (*)     Creatinine, Ser 1.33 (*)     Total Bilirubin 0.1 (*)     GFR calc non Af Amer 42 (*)     GFR calc Af Amer 49 (*)     All other components within normal limits  PRO B NATRIURETIC PEPTIDE - Abnormal; Notable for the following:    Pro B Natriuretic peptide (BNP) 1049.0 (*)     All other components within normal limits  OCCULT BLOOD, POC DEVICE  POCT I-STAT TROPONIN I   Dg Chest 2 View  10/29/2011  *RADIOLOGY REPORT*  Clinical Data: Shortness of breath, COPD, smoker  CHEST - 2 VIEW  Comparison: 11/06/2009; 11/05/2009; 12/13/2008  Findings: Grossly unchanged cardiac silhouette and mediastinal contours.  Atherosclerotic calcifications within the thoracic aorta.  Improved aeration of the bilateral lung bases with resolution of left-sided pleural effusion.  There is persistent blunting of the right costophrenic angle which may represent a residual small right-sided effusion versus pleural parenchymal thickening.  No new focal airspace opacities.  There is mild diffuse thickening of the pulmonary interstitium.  No pneumothorax. Unchanged bones.  Post cholecystectomy.  IMPRESSION: 1.  Chronic bronchitic change without acute cardiopulmonary disease. 2.  Near resolution of small bilateral effusions and bibasilar opacities with possible small residual right sided effusion versus pleural parenchymal thickening.    Original Report Authenticated By: Waynard Reeds, M.D.      1. Heart failure   2. Anemia       MDM   Pt feels stable after observation and/or treatment in ED.Patient / Family / Caregiver informed of clinical course, understand medical decision-making process, and agree with plan.       Hurman Horn, MD 10/29/11 2115

## 2011-10-29 NOTE — ED Notes (Signed)
Pt comes from home where she lives with her husband and daughter.  Pt c/o SOB which began Wednesday past, increasingly worse today.  Pt also c/o feeling "swimmy headed," which also began Wednesday.  Pt denies N/V/D and fever.  Pt endorses generalized weakness and lethargy.  Pt's lung sounds are clear in all lobes with good peripheral pulses in all 4 extremities.  Bowel sounds are present and abdomen is soft and non-tender to palpation.  Pt denies pain anywhere.  No edema noted.  Pt placed on cardiac monitor.  HR 68 NSR.

## 2011-10-29 NOTE — ED Notes (Signed)
Pt presents w/ shortness of breath, blood counts low, iron defiency anemia

## 2011-10-29 NOTE — ED Notes (Signed)
Patient transported to and returned from X-ray 

## 2011-10-31 ENCOUNTER — Telehealth: Payer: Self-pay | Admitting: Nurse Practitioner

## 2011-10-31 NOTE — Telephone Encounter (Signed)
Received message from unidentified female caller stating "I am calling for [name]...." requested return call to [phone].  RN attempted to return call.  Left message requesting call back.

## 2011-10-31 NOTE — Telephone Encounter (Signed)
Pt called and reported over the weekend she visited ED for SOB.  Per ED note- pt in heart failure.  Was given IV lasix and instructed to take extra PO Lasix.  Hgb- 8.3.    Pt stated she is still tired today and inquiring re: blood transfusion?  Pt stated she had not contacted her cardiologist.  RN instructed patient to call cardiologist and inform them of ED visit this weekend and heart failure.  RN told patient she would review information with [name], NP and return call.

## 2011-10-31 NOTE — Telephone Encounter (Signed)
Reviewed information with [name], NP.  No blood transfusion at this time.  Agreed with instructions to contact cardiologist.  Called patient and informed.  Pt stated she had called cardiologist and is awaiting return call.

## 2011-11-01 ENCOUNTER — Telehealth: Payer: Self-pay | Admitting: Cardiology

## 2011-11-01 NOTE — Telephone Encounter (Signed)
New Problem:    Patient called in to report, per suggestion form her cancer center doctor, that her CHF acted up yesterday, but she feels fine today.  Please call back if you have any questions.

## 2011-11-01 NOTE — Telephone Encounter (Signed)
Pt calling because she went to the ED last Saturday because her "hemoglobin was low".  States she was told by the MD there that her CHF was "acting up" her medications were adjusted.  She reports feeling fine, she denies any SOB or edema.  She states she will call if she should develop problems.  She will follow up as scheduled unless needs treatment sooner

## 2011-11-03 ENCOUNTER — Inpatient Hospital Stay (HOSPITAL_COMMUNITY)
Admission: EM | Admit: 2011-11-03 | Discharge: 2011-11-05 | DRG: 812 | Disposition: A | Discharge status: Home / Self Care | Payer: MEDICAID | Attending: Internal Medicine | Admitting: Internal Medicine

## 2011-11-03 ENCOUNTER — Emergency Department (HOSPITAL_COMMUNITY): Payer: Self-pay

## 2011-11-03 ENCOUNTER — Encounter (HOSPITAL_COMMUNITY): Payer: Self-pay | Admitting: *Deleted

## 2011-11-03 ENCOUNTER — Inpatient Hospital Stay (HOSPITAL_COMMUNITY): Payer: Self-pay

## 2011-11-03 DIAGNOSIS — I679 Cerebrovascular disease, unspecified: Secondary | ICD-10-CM | POA: Diagnosis present

## 2011-11-03 DIAGNOSIS — D500 Iron deficiency anemia secondary to blood loss (chronic): Secondary | ICD-10-CM | POA: Diagnosis present

## 2011-11-03 DIAGNOSIS — E1139 Type 2 diabetes mellitus with other diabetic ophthalmic complication: Secondary | ICD-10-CM | POA: Diagnosis present

## 2011-11-03 DIAGNOSIS — K5520 Angiodysplasia of colon without hemorrhage: Secondary | ICD-10-CM | POA: Diagnosis present

## 2011-11-03 DIAGNOSIS — Z23 Encounter for immunization: Secondary | ICD-10-CM

## 2011-11-03 DIAGNOSIS — Z8719 Personal history of other diseases of the digestive system: Secondary | ICD-10-CM

## 2011-11-03 DIAGNOSIS — Z860100 Personal history of colon polyps, unspecified: Secondary | ICD-10-CM

## 2011-11-03 DIAGNOSIS — I080 Rheumatic disorders of both mitral and aortic valves: Secondary | ICD-10-CM | POA: Diagnosis present

## 2011-11-03 DIAGNOSIS — E669 Obesity, unspecified: Secondary | ICD-10-CM | POA: Diagnosis present

## 2011-11-03 DIAGNOSIS — J4489 Other specified chronic obstructive pulmonary disease: Secondary | ICD-10-CM | POA: Diagnosis present

## 2011-11-03 DIAGNOSIS — J449 Chronic obstructive pulmonary disease, unspecified: Secondary | ICD-10-CM | POA: Diagnosis present

## 2011-11-03 DIAGNOSIS — I2510 Atherosclerotic heart disease of native coronary artery without angina pectoris: Secondary | ICD-10-CM | POA: Diagnosis present

## 2011-11-03 DIAGNOSIS — I10 Essential (primary) hypertension: Secondary | ICD-10-CM | POA: Diagnosis present

## 2011-11-03 DIAGNOSIS — I959 Hypotension, unspecified: Secondary | ICD-10-CM | POA: Diagnosis present

## 2011-11-03 DIAGNOSIS — Z79899 Other long term (current) drug therapy: Secondary | ICD-10-CM

## 2011-11-03 DIAGNOSIS — I5030 Unspecified diastolic (congestive) heart failure: Secondary | ICD-10-CM | POA: Diagnosis present

## 2011-11-03 DIAGNOSIS — F17200 Nicotine dependence, unspecified, uncomplicated: Secondary | ICD-10-CM | POA: Diagnosis present

## 2011-11-03 DIAGNOSIS — I509 Heart failure, unspecified: Secondary | ICD-10-CM | POA: Diagnosis present

## 2011-11-03 DIAGNOSIS — I5022 Chronic systolic (congestive) heart failure: Secondary | ICD-10-CM | POA: Diagnosis present

## 2011-11-03 DIAGNOSIS — R197 Diarrhea, unspecified: Secondary | ICD-10-CM

## 2011-11-03 DIAGNOSIS — Z7982 Long term (current) use of aspirin: Secondary | ICD-10-CM

## 2011-11-03 DIAGNOSIS — I739 Peripheral vascular disease, unspecified: Secondary | ICD-10-CM | POA: Diagnosis present

## 2011-11-03 DIAGNOSIS — I701 Atherosclerosis of renal artery: Secondary | ICD-10-CM | POA: Diagnosis present

## 2011-11-03 DIAGNOSIS — F329 Major depressive disorder, single episode, unspecified: Secondary | ICD-10-CM | POA: Diagnosis present

## 2011-11-03 DIAGNOSIS — D62 Acute posthemorrhagic anemia: Principal | ICD-10-CM | POA: Diagnosis present

## 2011-11-03 DIAGNOSIS — F3289 Other specified depressive episodes: Secondary | ICD-10-CM | POA: Diagnosis present

## 2011-11-03 DIAGNOSIS — Q279 Congenital malformation of peripheral vascular system, unspecified: Secondary | ICD-10-CM

## 2011-11-03 DIAGNOSIS — K559 Vascular disorder of intestine, unspecified: Secondary | ICD-10-CM | POA: Diagnosis present

## 2011-11-03 DIAGNOSIS — E118 Type 2 diabetes mellitus with unspecified complications: Secondary | ICD-10-CM | POA: Diagnosis present

## 2011-11-03 DIAGNOSIS — Z8673 Personal history of transient ischemic attack (TIA), and cerebral infarction without residual deficits: Secondary | ICD-10-CM | POA: Diagnosis present

## 2011-11-03 DIAGNOSIS — E785 Hyperlipidemia, unspecified: Secondary | ICD-10-CM | POA: Diagnosis present

## 2011-11-03 DIAGNOSIS — D649 Anemia, unspecified: Secondary | ICD-10-CM

## 2011-11-03 DIAGNOSIS — Q2730 Arteriovenous malformation, site unspecified: Secondary | ICD-10-CM

## 2011-11-03 DIAGNOSIS — Z6832 Body mass index (BMI) 32.0-32.9, adult: Secondary | ICD-10-CM

## 2011-11-03 DIAGNOSIS — K922 Gastrointestinal hemorrhage, unspecified: Secondary | ICD-10-CM

## 2011-11-03 DIAGNOSIS — I059 Rheumatic mitral valve disease, unspecified: Secondary | ICD-10-CM | POA: Diagnosis present

## 2011-11-03 DIAGNOSIS — E11319 Type 2 diabetes mellitus with unspecified diabetic retinopathy without macular edema: Secondary | ICD-10-CM | POA: Diagnosis present

## 2011-11-03 DIAGNOSIS — Z9089 Acquired absence of other organs: Secondary | ICD-10-CM

## 2011-11-03 HISTORY — DX: Angiodysplasia of colon without hemorrhage: K55.20

## 2011-11-03 HISTORY — DX: Type 2 diabetes mellitus with unspecified diabetic retinopathy without macular edema: E11.319

## 2011-11-03 LAB — RETICULOCYTES
RBC.: 2.26 MIL/uL — ABNORMAL LOW (ref 3.87–5.11)
Retic Count, Absolute: 239.6 10*3/uL — ABNORMAL HIGH (ref 19.0–186.0)
Retic Ct Pct: 10.6 % — ABNORMAL HIGH (ref 0.4–3.1)

## 2011-11-03 LAB — PRO B NATRIURETIC PEPTIDE: Pro B Natriuretic peptide (BNP): 922.7 pg/mL — ABNORMAL HIGH (ref 0–125)

## 2011-11-03 LAB — URINALYSIS, ROUTINE W REFLEX MICROSCOPIC
Bilirubin Urine: NEGATIVE
Hgb urine dipstick: NEGATIVE
Protein, ur: NEGATIVE mg/dL
Urobilinogen, UA: 0.2 mg/dL (ref 0.0–1.0)

## 2011-11-03 LAB — CBC WITH DIFFERENTIAL/PLATELET
Eosinophils Relative: 1 % (ref 0–5)
HCT: 24.7 % — ABNORMAL LOW (ref 36.0–46.0)
Hemoglobin: 7.9 g/dL — ABNORMAL LOW (ref 12.0–15.0)
Lymphocytes Relative: 17 % (ref 12–46)
Lymphs Abs: 0.9 10*3/uL (ref 0.7–4.0)
MCH: 33.3 pg (ref 26.0–34.0)
MCV: 104.2 fL — ABNORMAL HIGH (ref 78.0–100.0)
Monocytes Absolute: 0.6 10*3/uL (ref 0.1–1.0)
Monocytes Relative: 11 % (ref 3–12)
Platelets: 319 10*3/uL (ref 150–400)
RBC: 2.37 MIL/uL — ABNORMAL LOW (ref 3.87–5.11)
WBC: 5 10*3/uL (ref 4.0–10.5)

## 2011-11-03 LAB — HEPATIC FUNCTION PANEL
ALT: 9 U/L (ref 0–35)
Bilirubin, Direct: <0.1 mg/dL (ref 0.0–0.3)
Total Protein: 5.6 g/dL — ABNORMAL LOW (ref 6.0–8.3)

## 2011-11-03 LAB — COMPREHENSIVE METABOLIC PANEL
ALT: 10 U/L (ref 0–35)
BUN: 53 mg/dL — ABNORMAL HIGH (ref 6–23)
CO2: 28 mEq/L (ref 19–32)
Calcium: 9.4 mg/dL (ref 8.4–10.5)
GFR calc Af Amer: 42 mL/min — ABNORMAL LOW (ref >90)
GFR calc non Af Amer: 36 mL/min — ABNORMAL LOW (ref >90)
Glucose, Bld: 182 mg/dL — ABNORMAL HIGH (ref 70–99)
Sodium: 135 mEq/L (ref 135–145)

## 2011-11-03 LAB — LIPASE, BLOOD: Lipase: 59 U/L (ref 11–59)

## 2011-11-03 LAB — CBC
HCT: 23.5 % — ABNORMAL LOW (ref 36.0–46.0)
Hemoglobin: 7.3 g/dL — ABNORMAL LOW (ref 12.0–15.0)
WBC: 4.4 10*3/uL (ref 4.0–10.5)

## 2011-11-03 MED ORDER — SERTRALINE HCL 100 MG PO TABS
200.0000 mg | ORAL_TABLET | Freq: Every day | ORAL | Status: DC
  Administered 2011-11-04 – 2011-11-05 (×2): 200 mg via ORAL
  Filled 2011-11-03 (×2): qty 2

## 2011-11-03 MED ORDER — SODIUM CHLORIDE 0.9 % IV SOLN
8.0000 mg/h | INTRAVENOUS | Status: DC
  Administered 2011-11-04: 8 mg/h via INTRAVENOUS
  Filled 2011-11-03 (×6): qty 80

## 2011-11-03 MED ORDER — ONDANSETRON HCL 4 MG/2ML IJ SOLN: 4.0000 mg | Freq: Four times a day (QID) | INTRAMUSCULAR | Status: DC | PRN

## 2011-11-03 MED ORDER — SODIUM CHLORIDE 0.9 % IV SOLN: INTRAVENOUS | Status: DC

## 2011-11-03 MED ORDER — SODIUM CHLORIDE 0.9 % IV BOLUS (SEPSIS)
250.0000 mL | Freq: Once | INTRAVENOUS | Status: AC
  Administered 2011-11-03: 250 mL via INTRAVENOUS

## 2011-11-03 MED ORDER — ATORVASTATIN CALCIUM 10 MG PO TABS
10.0000 mg | ORAL_TABLET | Freq: Every day | ORAL | Status: DC
  Administered 2011-11-04: 10 mg via ORAL
  Filled 2011-11-03 (×2): qty 1

## 2011-11-03 MED ORDER — SODIUM CHLORIDE 0.9 % IV SOLN
80.0000 mg | Freq: Once | INTRAVENOUS | Status: AC
  Administered 2011-11-04: 80 mg via INTRAVENOUS
  Filled 2011-11-03: qty 80

## 2011-11-03 MED ORDER — INFLUENZA VIRUS VACC SPLIT PF IM SUSP
0.5000 mL | INTRAMUSCULAR | Status: AC
  Administered 2011-11-04: 0.5 mL via INTRAMUSCULAR
  Filled 2011-11-03: qty 0.5

## 2011-11-03 MED ORDER — ALPRAZOLAM 0.25 MG PO TABS
0.2500 mg | ORAL_TABLET | Freq: Two times a day (BID) | ORAL | Status: DC
  Administered 2011-11-04 – 2011-11-05 (×4): 0.25 mg via ORAL
  Filled 2011-11-03 (×4): qty 1

## 2011-11-03 MED ORDER — CLONIDINE HCL 0.1 MG PO TABS
0.1000 mg | ORAL_TABLET | Freq: Two times a day (BID) | ORAL | Status: DC
  Administered 2011-11-04: 0.1 mg via ORAL
  Filled 2011-11-03 (×3): qty 1

## 2011-11-03 MED ORDER — ONDANSETRON HCL 4 MG PO TABS: 4.0000 mg | ORAL_TABLET | Freq: Four times a day (QID) | ORAL | Status: DC | PRN

## 2011-11-03 MED ORDER — ONDANSETRON HCL 4 MG/2ML IJ SOLN: 4.0000 mg | INTRAMUSCULAR | Status: DC | PRN

## 2011-11-03 MED ORDER — ONDANSETRON HCL 4 MG/2ML IJ SOLN: 4.0000 mg | Freq: Three times a day (TID) | INTRAMUSCULAR | Status: DC | PRN

## 2011-11-03 MED ORDER — FLUTICASONE PROPIONATE 50 MCG/ACT NA SUSP
2.0000 | Freq: Every day | NASAL | Status: DC
  Administered 2011-11-04 – 2011-11-05 (×2): 2 via NASAL
  Filled 2011-11-03: qty 16

## 2011-11-03 MED ORDER — FERROUS SULFATE 325 (65 FE) MG PO TABS
325.0000 mg | ORAL_TABLET | Freq: Three times a day (TID) | ORAL | Status: DC
  Administered 2011-11-04 – 2011-11-05 (×5): 325 mg via ORAL
  Filled 2011-11-03 (×7): qty 1

## 2011-11-03 MED ORDER — LEVALBUTEROL HCL 0.63 MG/3ML IN NEBU
0.6300 mg | INHALATION_SOLUTION | Freq: Four times a day (QID) | RESPIRATORY_TRACT | Status: DC | PRN
  Filled 2011-11-03: qty 3

## 2011-11-03 NOTE — Progress Notes (Signed)
[facility] ED CM noted cm consult from admission RN for assistance because pt does not have insurance. CM spoke with pt who confirms she remains self pay states she previously saw [name] at [facility] but has started seeing pcps at [facility] First appt is on November 14 2011.  Pt reports her medications are now obtained from [facility] and she has requested her medical records from [facility] Medical records from [facility] still pending CM provided her and female visitor at bedside with list of other self pay [place] pcps, DSS, health dept [email or web address], discount pharmacies and financial assistance resources.  Pt voiced understanding and appreciation of services and resources offered

## 2011-11-03 NOTE — ED Notes (Signed)
Attempted to call report to 2W. RN unable to take report at this time. Will call back.

## 2011-11-03 NOTE — ED Notes (Signed)
Placed patient on monitor

## 2011-11-03 NOTE — ED Notes (Signed)
Patient transported to CT 

## 2011-11-03 NOTE — ED Notes (Signed)
Pt c/o lower abd pain and rectal bleeding x's 2-3 days. Denies vomiting. Reports nausea and diarrhea.

## 2011-11-03 NOTE — H&P (Signed)
[facility] History and Physical  FADUMA CHO ZOX:[id number] DOB: January 05, 1951 DOA: 11/03/2011  Referring physician:  PCP: Norberto Sorenson, MD   Chief Complaint: GI bleeding  HPI:  This is a 61 year old white female well known to me with multiple  medical problems including diabetes mellitus, morbid obesity, hypertension,  hyperlipidemia, tobacco abuse, peripheral vascular disease, and recent evaluation for worsening SOB who was found to have a hemoglobin of 7.0. She has a history of  iron-deficiency anemia and Hemoccult positive stool which has been  extensively evaluated in the past, including colonoscopy, upper endoscopy,  push enteroscopy, and capsule endoscopy. 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 However, it seems that her  anemia has been out of proportion to her occult intermittent GI blood loss.  She has been on iron therapy t.i.d. and was getting CBC's about every three  Months.  She was recently started on Aranesp  injections. Her hemoglobin 5 months ago was  Her MCV is elevated at 104.2 . The patient has had melena     The patient also had a negative nuclear cardiac stress test on August 31, 2011. The patient states that she has been fatigued, but denies any other complaints. The patient stated that she had a colonoscopy about 2 years ago and was told to return in 10 years. The patient continues to smoke 1 pack of cigarettes daily. Smoking cessation was discussed with the patient. The patient's iron level is at 26 down from 43 in February, 2013. The patient  received a Feraheme infusion recently.    Review of Systems: negative for the following   Constitutional: Chills, Fever, Unexplained weight loss, Unexplained recent fatigue, Change in appetite  Eyes: Blurred vision, redness in eyes, pain in eyes  Ears, Nose, Mouth, Throat: Unexplained decreased hearing, sore throat swollen glands  Cardiovascular: Chest pain, irregular heartbeat,  frequent dizziness upon standing up, shortness of breath lying down  Respiratory: Cough, wheezing, shortness of breath   Gastrointestinal: Moderate to severe abdominal pain, diarrhea, blood in stool  Genitourinary: Blood in urine, difficulty urinating. Women only, change in menstrual period, irregular menstrual periods, excessively heavy bleeding   Musculoskeletal: Arthritis, abnormal weakness of muscles   Breasts: New breast lumps, nipple discharge or irritation   Neurological: Dizziness, numbing/tingling, loss of balance when walking   Psychiatric: Depression, mood swings, significant and unexplained memory loss  Endocrine: Excessively hot or cold, unexplained weight loss or gain   Hematologic/Lymphatic: Swollen glands, easy bruising   Allergic/Immunologic: Hives, runny nose, sneezing      Past Medical History  Diagnosis Date  . Peripheral vascular disease     status post left common iliac and external iliac artery stents  . Cerebrovascular disease   . Diabetes mellitus   . COPD (chronic obstructive pulmonary disease)   . Hyperlipidemia   . Depression   . Diastolic congestive heart failure   . Hypertension   . Renal artery stenosis     status post  . CAD (coronary artery disease)     50% left main stenosis, ostial 60% LAD stenosis, 30% ramus intermediate stenosis, occluded right artery with left-to-right collaterals  . Iron deficiency anemia   . Carotid arterial disease   . Diabetic retinopathy   . 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 Carotid endarterectomy   . Cholecystectomy   . Appendectomy   . Tonsillectomy   .  Cesarean section   . Shoulder arthroscopy   . Iliac artery stent     left      Social History: Social History   .  Marital Status:  Married     Spouse Name:  N/A     Number of Children:  N/A   .  Years of Education:  N/A    Occupational History   .  Not on  file.    Social History Main Topics   .  Smoking status:  Current Every Day Smoker -- 1.0 packs/day for 36 years     Types:  Cigarettes   .  Smokeless tobacco:  Never Used   .  Alcohol Use:  No   .  Drug Use:  No   .  Sexually Active:  Not on file     No Known Allergies  Family history reviewed and found to be negative for hypertension diabetes    Prior to Admission medications   Medication Sig Start Date End Date Taking? Authorizing Provider  acetaminophen (TYLENOL) 500 MG tablet Take 500 mg by mouth every 6 (six) hours as needed. For pain   Yes Historical Provider, MD  albuterol (PROVENTIL HFA;VENTOLIN HFA) 108 (90 BASE) MCG/ACT inhaler Inhale 2 puffs into the lungs every 6 (six) hours as needed. For shortness of breath   Yes Historical Provider, MD  ALPRAZolam (XANAX) 0.25 MG tablet Take 0.25 mg by mouth 2 (two) times daily.    Yes Historical Provider, MD  amLODipine (NORVASC) 10 MG tablet Take 10 mg by mouth daily.    Yes Historical Provider, MD  aspirin 81 MG tablet Take 81 mg by mouth daily.    Yes Historical Provider, MD  calcium carbonate (OS-CAL) 600 MG TABS Take 600 mg by mouth 3 (three) times daily with meals.    Yes Historical Provider, MD  cloNIDine (CATAPRES) 0.1 MG tablet Take 0.1 mg by mouth 2 (two) times daily.   Yes Historical Provider, MD  famotidine (PEPCID) 20 MG tablet Take 20 mg by mouth 2 (two) times daily.   Yes Historical Provider, MD  Ferrous Sulfate (IRON) 325 (65 FE) MG TABS Take 1 tablet by mouth 3 (three) times daily.    Yes Historical Provider, MD  fexofenadine (ALLEGRA) 180 MG tablet Take 180 mg by mouth daily as needed. For allergies   Yes Historical Provider, MD  fish oil-omega-3 fatty acids 1000 MG capsule Take 1 g by mouth daily. Three times daily    Yes Historical Provider, MD  furosemide (LASIX) 80 MG tablet Take 80 mg by mouth daily.     Yes Historical Provider, MD  gemfibrozil (LOPID) 600 MG tablet Take 600 mg by mouth 2 (two) times daily before  a meal.    Yes Historical Provider, MD  lisinopril (PRINIVIL,ZESTRIL) 40 MG tablet Take 40 mg by mouth daily.   Yes Historical Provider, MD  metFORMIN (GLUCOPHAGE) 1000 MG tablet Take 1,000 mg by mouth 2 (two) times daily with a meal.     Yes Historical Provider, MD  Multiple Vitamin (MULTIVITAMIN WITH MINERALS) TABS Take 1 tablet by mouth daily.   Yes Historical Provider, MD  pantoprazole (PROTONIX) 40 MG tablet Take 40 mg by mouth daily.     Yes Historical Provider, MD  potassium chloride (K-DUR) 10 MEQ tablet Take 10 mEq by mouth.     Yes Historical Provider, MD  rosuvastatin (CRESTOR) 40 MG tablet Take 40 mg by mouth every evening.    Yes Historical Provider, MD  sertraline (ZOLOFT) 100 MG tablet Take 200 mg by mouth daily.    Yes Historical Provider, MD  fluticasone (FLONASE) 50 MCG/ACT nasal spray Place 2 sprays into the nose daily as needed. For allergies    Historical Provider, MD     Physical Exam: Filed Vitals:   11/03/11 1425 11/03/11 1502 11/03/11 1503 11/03/11 1504  BP: 119/51 92/33 90/43  100/41  Pulse: 74 68 75 67  Temp: 98.5 F (36.9 C) 98.4 F (36.9 C)    TempSrc: Oral Oral    Resp: 14 3 16 17   SpO2: 96% 100% 95% 97%    General appearance: NAD, conversant  Eyes: anicteric sclerae, moist conjunctivae; no lid-lag; PERRLA HENT: Atraumatic; oropharynx clear with moist mucous membranes and no mucosal ulcerations; normal hard and soft palate Neck: Trachea midline; FROM, supple, no thyromegaly or lymphadenopathy Lungs: CTA, with normal respiratory effort and no intercostal retractions CV: RRR, no MRGs  Abdomen: Soft, non-tender; no masses or HSM Extremities: No peripheral edema or extremity lymphadenopathy Skin: Normal temperature, turgor and texture; no rash, ulcers or subcutaneous nodules Psych: Appropriate affect, alert and oriented to person, place and time     Labs on Admission:    Basic Metabolic Panel:  Lab 11/03/11 8469 10/29/11 1002  NA 135 136  K 4.0  4.2  CL 96 96  CO2 28 28  GLUCOSE 182* 113*  BUN 53* 49*  CREATININE 1.51* 1.33*  CALCIUM 9.4 10.5  MG -- --  PHOS -- --   Liver Function Tests:  Lab 11/03/11 1139 10/29/11 1002  AST 19 19  ALT 10 10  ALKPHOS 76 78  BILITOT 0.1* 0.1*  PROT 6.1 6.8  ALBUMIN 3.0* 3.6    Lab 11/03/11 1139  LIPASE 59  AMYLASE --   No results found for this basename: AMMONIA:5 in the last 168 hours CBC:  Lab 11/03/11 1139 10/29/11 1002  WBC 5.0 8.9  NEUTROABS 3.5 7.0  HGB 7.9* 8.3*  HCT 24.7* 26.8*  MCV 104.2* 104.3*  PLT 319 306   Cardiac Enzymes: No results found for this basename: CKTOTAL:5,CKMB:5,CKMBINDEX:5,TROPONINI:5 in the last 168 hours  BNP (last 3 results)  Basename 10/29/11 1002  PROBNP 1049.0*      CBG: No results found for this basename: GLUCAP:5 in the last 168 hours  Radiological Exams on Admission: Ct Abdomen Pelvis Wo Contrast  11/03/2011  *RADIOLOGY REPORT*  Clinical Data: Rectal bleeding, cholecystectomy and appendectomy  CT ABDOMEN AND PELVIS WITHOUT CONTRAST  Technique:  Multidetector CT imaging of the abdomen and pelvis was performed following the standard protocol without intravenous contrast.  Comparison: CT abdomen 12/10/2005  Findings: Lung bases are clear.  No pericardial fluid.  No focal hepatic lesion on this noncontrast exam.  Prior cholecystectomy.  The pancreas, spleen, adrenal glands, and kidneys are unchanged.  There is a cleft in the lower pole right kidney suggesting scarring.  The stomach, small bowel, cecum are normal.  Colon is normal.  The rectosigmoid colon is normal.  Abdominal aorta is normal caliber.  No retroperitoneal periportal lymphadenopathy.  No free fluid the pelvis.  The uterus and bladder and necks are normal.  Heavy intimal calcification of the iliac arteries.  No adenopathy. Review of  bone windows demonstrates no aggressive osseous lesions.  IMPRESSION:  1.  No acute abdominal or pelvic findings. 2.  No colonic abnormality or  rectal abnormality evident by CT.   Original Report Authenticated By: Genevive Bi, M.D.     EKG: Independently reviewed. None Assessment/Plan   #  26 61 year old female vasculopath presenting with diarrhea and complaints of dark blood in her stool as well as lower abdominal crampy discomfort over the past 4 days . I do not think she's had any significant GI bleeding her hemoglobin is actually higher than when checked 2 weeks ago. She may be a bit hemoconcentrated and wonder if she has had an ischemic colitis not evident on noncontrasted CT. Other possibility would be an infectious diarrhea/gastroenteritis. 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  #3 chronic iron deficiency anemia with baseline hemoglobin between 7 and 8 #4 coronary artery disease hold aspirin #5 cerebrovascular disease status post carotid endarterectomy and history of TIA again hold aspirin #6 history of renal artery stenosis with prior intervention  #7 history of left iliac stents  #8 diabetes mellitus start sliding scale insulin #9 COPD with continued smoking continue Flonase #10 history of diverticulosis and hyperplastic polyps at the time of last colonoscopy 2008  #11 hypotension hold antihypertensive medications   Code Status:   full Family Communication: bedside Disposition Plan: admit   Time spent: 70 mins   [facility] Pager [phone]  If 7PM-7AM, please contact night-coverage [email or web address] Password [facility] 11/03/2011, 4:35 PM

## 2011-11-03 NOTE — ED Notes (Signed)
Reported BP's to RN, Little

## 2011-11-03 NOTE — Consult Note (Addendum)
Chart was reviewed and patient was examined. X-rays were reviewed.    The patient's primary GI problem is diarrhea with incontinence. Bleeding is chronic and does not appear to be worsened. Slight elevation of her hemoglobin likely reflects hemoconcentration. Patient may certainly have colitis, from infectious colitis or infectious colitis.  Recommendations #1 check stools for lactoferrin, C. difficile toxin, culture and sensitivity #2 defer endoscopic studies unless bleeding becomes more acute  Barbette Hair. Arlyce Dice, M.D., [facility] Cell [phone]

## 2011-11-03 NOTE — ED Provider Notes (Signed)
History     CSN: [id number]  Arrival date & time 11/03/11  1103   First MD Initiated Contact with Patient 11/03/11 1123      Chief Complaint  Patient presents with  . Rectal Bleeding    HPI Pt was seen at 1130.  Per pt, c/o gradual onset and persistence of constant abd "pain" for the past 2 to 3 days.  Has been associated with diarrhea.  Describes her stools as "black" and  "bloody" as well as containing "mucus" for the past 2-3 days.  Denies N/V, no CP/SOB, no back pain, no fevers.     Past Medical History  Diagnosis Date  . Peripheral vascular disease     status post left common iliac and external iliac artery stents  . Cerebrovascular disease   . Diabetes mellitus   . COPD (chronic obstructive pulmonary disease)   . Hyperlipidemia   . Depression   . Diastolic congestive heart failure   . Hypertension   . Renal artery stenosis     status post  . CAD (coronary artery disease)     50% left main stenosis, ostial 60% LAD stenosis, 30% ramus intermediate stenosis, occluded right artery with left-to-right collaterals  . Iron deficiency anemia   . Carotid arterial disease   . Diabetic retinopathy   . 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 Carotid endarterectomy   . Cholecystectomy   . Appendectomy   . Tonsillectomy   . Cesarean section   . Shoulder arthroscopy   . Iliac artery stent     left    History  Substance Use Topics  . Smoking status: Current Every Day Smoker -- 1.0 packs/day for 36 years    Types: Cigarettes  . Smokeless tobacco: Never Used  . Alcohol Use: No    Review of Systems ROS: Statement: All systems negative except as marked or noted in the HPI; Constitutional: Negative for fever and chills. ; ; Eyes: Negative for eye pain, redness and discharge. ; ; ENMT: Negative for ear pain, hoarseness, nasal congestion, sinus pressure and sore throat. ; ; Cardiovascular:  Negative for chest pain, palpitations, diaphoresis, dyspnea and peripheral edema. ; ; Respiratory: Negative for cough, wheezing and stridor. ; ; Gastrointestinal: +abd pain, diarrhea, rectal bleeding, black stools. Negative for nausea, vomiting, hematemesis, jaundice . ; ; Genitourinary: Negative for dysuria, flank pain and hematuria. ; ; Musculoskeletal: Negative for back pain and neck pain. Negative for swelling and trauma.; ; Skin: Negative for pruritus, rash, abrasions, blisters, bruising and skin lesion.; ; Neuro: Negative for headache, lightheadedness and neck stiffness. Negative for weakness, altered level of consciousness , altered mental status, extremity weakness, paresthesias, involuntary movement, seizure and syncope.       Allergies  Review of patient's allergies indicates no known allergies.  Home Medications   Current Outpatient Rx  Name Route Sig Dispense Refill  . ACETAMINOPHEN 500 MG PO TABS Oral Take 500 mg by mouth every 6 (six) hours as needed. For pain    . ALBUTEROL SULFATE HFA 108 (90 BASE) MCG/ACT IN AERS Inhalation Inhale 2 puffs into the lungs every 6 (six) hours as needed. For shortness of breath    . ALPRAZOLAM 0.25 MG PO TABS Oral Take 0.25 mg by mouth 2 (two) times daily.     [name] AMLODIPINE BESYLATE 10 MG PO TABS Oral Take 10 mg by mouth daily.     [name]  ASPIRIN 81 MG PO TABS Oral Take 81 mg by mouth daily.     [name] CALCIUM CARBONATE 600 MG PO TABS Oral Take 600 mg by mouth 3 (three) times daily with meals.     [name] CLONIDINE HCL 0.1 MG PO TABS Oral Take 0.1 mg by mouth 2 (two) times daily.    [name] FAMOTIDINE 20 MG PO TABS Oral Take 20 mg by mouth 2 (two) times daily.    . IRON 325 (65 FE) MG PO TABS Oral Take 1 tablet by mouth 3 (three) times daily.     [name] FEXOFENADINE HCL 180 MG PO TABS Oral Take 180 mg by mouth daily as needed. For allergies    . OMEGA-3 FATTY ACIDS 1000 MG PO CAPS Oral Take 1 g by mouth daily. Three times daily     . FUROSEMIDE 80 MG PO TABS Oral Take 80 mg  by mouth daily.      [name] GEMFIBROZIL 600 MG PO TABS Oral Take 600 mg by mouth 2 (two) times daily before a meal.     . LISINOPRIL 40 MG PO TABS Oral Take 40 mg by mouth daily.    [name] METFORMIN HCL 1000 MG PO TABS Oral Take 1,000 mg by mouth 2 (two) times daily with a meal.      . ADULT MULTIVITAMIN W/MINERALS CH Oral Take 1 tablet by mouth daily.    [name] PANTOPRAZOLE SODIUM 40 MG PO TBEC Oral Take 40 mg by mouth daily.      [name] POTASSIUM CHLORIDE ER 10 MEQ PO TBCR Oral Take 10 mEq by mouth.      . ROSUVASTATIN CALCIUM 40 MG PO TABS Oral Take 40 mg by mouth every evening.     [name] SERTRALINE HCL 100 MG PO TABS Oral Take 200 mg by mouth daily.     [name] FLUTICASONE PROPIONATE 50 MCG/ACT NA SUSP Nasal Place 2 sprays into the nose daily as needed. For allergies      BP 100/41  Pulse 67  Temp 98.4 F (36.9 C) (Oral)  Resp 17  SpO2 97%  Physical Exam 1135: Physical examination:  Nursing notes reviewed; Vital signs and O2 SAT reviewed;  Constitutional: Well developed, Well nourished, In no acute distress; Head:  Normocephalic, atraumatic; Eyes: EOMI, PERRL, No scleral icterus; ENMT: Mouth and pharynx normal, Mucous membranes dry; Neck: Supple, Full range of motion, No lymphadenopathy; Cardiovascular: Regular rate and rhythm, No murmur, rub, or gallop; Respiratory: Breath sounds clear & equal bilaterally, No wheezes.  Speaking full sentences with ease, Normal respiratory effort/excursion; Chest: Nontender, Movement normal; Abdomen: Soft, +LLQ and suprapubic areas tender to palp. Nondistended, Normal bowel sounds;; Rectal exam performed w/permission of pt and ED RN chaparone present.  Anal tone normal.  Non-tender, soft brown stool in rectal vault, heme positive.  No fissures, +external hemorrhoids without thrombosis or bleeding, no palp masses.;; Extremities: Pulses normal, No tenderness, No edema, No calf edema or asymmetry.; Neuro: AA&Ox3, Major CN grossly intact.  Speech clear. No gross focal motor or sensory  deficits in extremities.; Skin: Color pale, Warm, Dry.   ED Course  Procedures    MDM  MDM Reviewed: nursing note, vitals and previous chart Reviewed previous: labs Interpretation: labs, x-ray and CT scan   Results for orders placed during the hospital encounter of 11/03/11  CBC WITH DIFFERENTIAL      Component Value Range   WBC 5.0  4.0 - 10.5 K/uL   RBC 2.37 (*) 3.87 - 5.11 MIL/uL  Hemoglobin 7.9 (*) 12.0 - 15.0 g/dL   HCT 82.9 (*) 56.2 - 13.0 %   MCV 104.2 (*) 78.0 - 100.0 fL   MCH 33.3  26.0 - 34.0 pg   MCHC 32.0  30.0 - 36.0 g/dL   RDW 86.5 (*) 78.4 - 69.6 %   Platelets 319  150 - 400 K/uL   Neutrophils Relative 70  43 - 77 %   Neutro Abs 3.5  1.7 - 7.7 K/uL   Lymphocytes Relative 17  12 - 46 %   Lymphs Abs 0.9  0.7 - 4.0 K/uL   Monocytes Relative 11  3 - 12 %   Monocytes Absolute 0.6  0.1 - 1.0 K/uL   Eosinophils Relative 1  0 - 5 %   Eosinophils Absolute 0.1  0.0 - 0.7 K/uL   Basophils Relative 1  0 - 1 %   Basophils Absolute 0.0  0.0 - 0.1 K/uL  COMPREHENSIVE METABOLIC PANEL      Component Value Range   Sodium 135  135 - 145 mEq/L   Potassium 4.0  3.5 - 5.1 mEq/L   Chloride 96  96 - 112 mEq/L   CO2 28  19 - 32 mEq/L   Glucose, Bld 182 (*) 70 - 99 mg/dL   BUN 53 (*) 6 - 23 mg/dL   Creatinine, Ser 2.95 (*) 0.50 - 1.10 mg/dL   Calcium 9.4  8.4 - 28.4 mg/dL   Total Protein 6.1  6.0 - 8.3 g/dL   Albumin 3.0 (*) 3.5 - 5.2 g/dL   AST 19  0 - 37 U/L   ALT 10  0 - 35 U/L   Alkaline Phosphatase 76  39 - 117 U/L   Total Bilirubin 0.1 (*) 0.3 - 1.2 mg/dL   GFR calc non Af Amer 36 (*) >90 mL/min   GFR calc Af Amer 42 (*) >90 mL/min  LIPASE, BLOOD      Component Value Range   Lipase 59  11 - 59 U/L  URINALYSIS, ROUTINE W REFLEX MICROSCOPIC      Component Value Range   Color, Urine YELLOW  YELLOW   APPearance CLEAR  CLEAR   Specific Gravity, Urine 1.018  1.005 - 1.030   pH 5.5  5.0 - 8.0   Glucose, UA NEGATIVE  NEGATIVE mg/dL   Hgb urine dipstick NEGATIVE   NEGATIVE   Bilirubin Urine NEGATIVE  NEGATIVE   Ketones, ur NEGATIVE  NEGATIVE mg/dL   Protein, ur NEGATIVE  NEGATIVE mg/dL   Urobilinogen, UA 0.2  0.0 - 1.0 mg/dL   Nitrite NEGATIVE  NEGATIVE   Leukocytes, UA NEGATIVE  NEGATIVE  OCCULT BLOOD, POC DEVICE      Component Value Range   Fecal Occult Bld POSITIVE     Ct Abdomen Pelvis Wo Contrast 11/03/2011  *RADIOLOGY REPORT*  Clinical Data: Rectal bleeding, cholecystectomy and appendectomy  CT ABDOMEN AND PELVIS WITHOUT CONTRAST  Technique:  Multidetector CT imaging of the abdomen and pelvis was performed following the standard protocol without intravenous contrast.  Comparison: CT abdomen 12/10/2005  Findings: Lung bases are clear.  No pericardial fluid.  No focal hepatic lesion on this noncontrast exam.  Prior cholecystectomy.  The pancreas, spleen, adrenal glands, and kidneys are unchanged.  There is a cleft in the lower pole right kidney suggesting scarring.  The stomach, small bowel, cecum are normal.  Colon is normal.  The rectosigmoid colon is normal.  Abdominal aorta is normal caliber.  No retroperitoneal periportal lymphadenopathy.  No free fluid the pelvis.  The uterus and bladder and necks are normal.  Heavy intimal calcification of the iliac arteries.  No adenopathy. Review of  bone windows demonstrates no aggressive osseous lesions.  IMPRESSION:  1.  No acute abdominal or pelvic findings. 2.  No colonic abnormality or rectal abnormality evident by CT.   Original Report Authenticated By: Genevive Bi, M.D.    Dg Chest 2 View 10/29/2011  *RADIOLOGY REPORT*  Clinical Data: Shortness of breath, COPD, smoker  CHEST - 2 VIEW  Comparison: 11/06/2009; 11/05/2009; 12/13/2008  Findings: Grossly unchanged cardiac silhouette and mediastinal contours.  Atherosclerotic calcifications within the thoracic aorta.  Improved aeration of the bilateral lung bases with resolution of left-sided pleural effusion.  There is persistent blunting of the right  costophrenic angle which may represent a residual small right-sided effusion versus pleural parenchymal thickening.  No new focal airspace opacities.  There is mild diffuse thickening of the pulmonary interstitium.  No pneumothorax. Unchanged bones.  Post cholecystectomy.  IMPRESSION: 1.  Chronic bronchitic change without acute cardiopulmonary disease. 2.  Near resolution of small bilateral effusions and bibasilar opacities with possible small residual right sided effusion versus pleural parenchymal thickening.   Original Report Authenticated By: Waynard Reeds, M.D.    Results for MARITES, NATH (MRN [id number]) as of 11/03/2011 15:33  Ref. Range 05/09/2011 11:25 10/18/2011 13:45 10/29/2011 10:02 11/03/2011 11:39  Hemoglobin Latest Range: 12.0-15.0 g/dL 21.3 7.0 (L) 8.3 (L) 7.9 (L)  HCT Latest Range: 36.0-46.0 % 37.0 21.3 (L) 26.8 (L) 24.7 (L)   Results for AYSSA, BENTIVEGNA (MRN [id number]) as of 11/03/2011 15:33  Ref. Range 05/09/2011 11:25 10/29/2011 10:02 11/03/2011 11:39  BUN Latest Range: 6-23 mg/dL 49 (H) 49 (H) 53 (H)  Creatinine Latest Range: 0.50-1.10 mg/dL 6.29 (H) 5.28 (H) 4.13 (H)      1515:  +orthostatic.  BUN/Cr elevated, H/H slightly lower than usual.  May need transfusion.  Dx testing d/w pt and family.  Questions answered.  Verb understanding, agreeable to admit.  T/C to [facility] NP [facility], case discussed, including:  HPI, pertinent PM/SHx, VS/PE, dx testing, ED course and treatment:  Agreeable to admit, requests to write temporary orders, obtain stepdown bed to team 8.        Laray Anger, DO 11/06/11 1707

## 2011-11-03 NOTE — ED Notes (Signed)
Patient returned from CT

## 2011-11-03 NOTE — ED Notes (Signed)
Pt c/o lower abd pain and bright red stools x's 2 days. Reports hx of diverticulitis and multiple blood transfusions. Pts skin pale, dry and intact. Tender with palpation of abd.

## 2011-11-03 NOTE — Consult Note (Signed)
Referring Provider: Dr Karenann Cai  Primary Care Physician:  Norberto Sorenson, MD Primary Gastroenterologist:  Dr Marina Goodell -2008.  Reason for Consultation:  Gi bleeding  HPI: [name] is a 61 y.o. female with multiple medical problems. She had [facility] a [facility] and has not been seen by [facility] since 2008. She has history of peripheral vascular disease and coronary artery disease as well as diastolic congestive heart failure and COPD. She also has adult onset diabetes mellitus and history of a TIA. She is status post carotid endarterectomy has history of renal artery stenosis status post previous intervention and has had a left common iliac and external iliac artery stent placement. She is not anticoagulated but is on a baby aspirin daily. She also has history of chronic anemia which by oncology notes is felt secondary to chronic GI blood loss with history of intestinal AVMs. Her baseline hemoglobin appears to be in the 7-8 range. She does take oral iron on a daily basis and also receives periodic very heme infusions. She had an iron infusion in February and then again in mid August of this year She had a CBC done on August 27 showing hemoglobin of 7 hematocrit of 21.3 and an MCV of 96. She  comes to the emergency room today complaining of generalized weakness and dark diarrhea. She says she had onset of symptoms on "Sunday, 10/30/2011 with crampy abdominal pain and then in: And diarrhea which she says was very dark and watery. She is a poor historian but think she probably had 4-5 bowel movements that day she does not remember having any nausea vomiting or diaphoresis. She does not think she's had a recent antibiotics. Now over the past 3 days she has continued to have frequent loose stools primarily in continent and says she has passed some dark red blood. When asked to try to quantify the amount of bleeding she says she's been having more squirts of stools She had recent  evaluation with Dr. Hochrein for cardiology at that time was noted to have a blood pressure 100/41, meds were not changed. Today in the ER blood pressure 87/47 pulse in the 80s. Exam per ER physician showed soft brown Hemoccult-positive stool no gross blood and no gross melena. CT of the abdomen and pelvis was done in the ER without any contrast and does not show any abnormality in the colon. She is admitted for further diagnostic workup. Interestingly her hemoglobin today is 7.9 hematocrit of 24.7 BUN is elevated at 53 and creatinine elevated at 1.5 WBC is normal  Last colonoscopy was done in 2008 per Dr. Perry she has diverticulosis and had hyperplastic polyps at that time I am waiting for records of an endoscopy done in 2006 and apparently also had a capsule endoscopy around that same time no I. could not find in the computer system.   Past Medical History  Diagnosis Date  . Peripheral vascular disease     status post left common iliac and external iliac artery stents  . Cerebrovascular disease   . Diabetes mellitus   . COPD (chronic obstructive pulmonary disease)   . Hyperlipidemia   . Depression   . Diastolic congestive heart failure   . Hypertension   . Renal artery stenosis     status post  . CAD (coronary artery disease)     50" % left main stenosis, ostial 60% LAD stenosis, 30% ramus intermediate stenosis, occluded right artery with left-to-right collaterals  . Iron deficiency anemia   .  Carotid arterial disease   . Diabetic retinopathy   . 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 Carotid endarterectomy   . Cholecystectomy   . Appendectomy   . Tonsillectomy   . Cesarean section   . Shoulder arthroscopy   . Iliac artery stent     left    Prior to Admission medications   Medication Sig Start Date End Date Taking? Authorizing Provider  acetaminophen (TYLENOL) 500 MG tablet Take 500 mg by  mouth every 6 (six) hours as needed. For pain   Yes Historical Provider, MD  albuterol (PROVENTIL HFA;VENTOLIN HFA) 108 (90 BASE) MCG/ACT inhaler Inhale 2 puffs into the lungs every 6 (six) hours as needed. For shortness of breath   Yes Historical Provider, MD  ALPRAZolam (XANAX) 0.25 MG tablet Take 0.25 mg by mouth 2 (two) times daily.    Yes Historical Provider, MD  amLODipine (NORVASC) 10 MG tablet Take 10 mg by mouth daily.    Yes Historical Provider, MD  aspirin 81 MG tablet Take 81 mg by mouth daily.    Yes Historical Provider, MD  calcium carbonate (OS-CAL) 600 MG TABS Take 600 mg by mouth 3 (three) times daily with meals.    Yes Historical Provider, MD  cloNIDine (CATAPRES) 0.1 MG tablet Take 0.1 mg by mouth 2 (two) times daily.   Yes Historical Provider, MD  famotidine (PEPCID) 20 MG tablet Take 20 mg by mouth 2 (two) times daily.   Yes Historical Provider, MD  Ferrous Sulfate (IRON) 325 (65 FE) MG TABS Take 1 tablet by mouth 3 (three) times daily.    Yes Historical Provider, MD  fexofenadine (ALLEGRA) 180 MG tablet Take 180 mg by mouth daily as needed. For allergies   Yes Historical Provider, MD  fish oil-omega-3 fatty acids 1000 MG capsule Take 1 g by mouth daily. Three times daily    Yes Historical Provider, MD  furosemide (LASIX) 80 MG tablet Take 80 mg by mouth daily.     Yes Historical Provider, MD  gemfibrozil (LOPID) 600 MG tablet Take 600 mg by mouth 2 (two) times daily before a meal.    Yes Historical Provider, MD  lisinopril (PRINIVIL,ZESTRIL) 40 MG tablet Take 40 mg by mouth daily.   Yes Historical Provider, MD  metFORMIN (GLUCOPHAGE) 1000 MG tablet Take 1,000 mg by mouth 2 (two) times daily with a meal.     Yes Historical Provider, MD  Multiple Vitamin (MULTIVITAMIN WITH MINERALS) TABS Take 1 tablet by mouth daily.   Yes Historical Provider, MD  pantoprazole (PROTONIX) 40 MG tablet Take 40 mg by mouth daily.     Yes Historical Provider, MD  potassium chloride (K-DUR) 10 MEQ  tablet Take 10 mEq by mouth.     Yes Historical Provider, MD  rosuvastatin (CRESTOR) 40 MG tablet Take 40 mg by mouth every evening.    Yes Historical Provider, MD  sertraline (ZOLOFT) 100 MG tablet Take 200 mg by mouth daily.    Yes Historical Provider, MD  fluticasone (FLONASE) 50 MCG/ACT nasal spray Place 2 sprays into the nose daily as needed. For allergies    Historical Provider, MD    Current Facility-Administered Medications  Medication Dose Route Frequency Provider Last Rate Last Dose  . 0.9 %  sodium chloride infusion   Intravenous Continuous Richarda Overlie, MD      . influenza  inactive virus vaccine (FLUZONE/FLUARIX) injection 0.5 mL  0.5 mL Intramuscular Tomorrow-1000 Hollice Espy, MD      . ondansetron [facility]) injection 4 mg  4 mg Intravenous Q1H PRN Laray Anger, DO      . sodium chloride 0.9 % bolus 250 mL  250 mL Intravenous Once Laray Anger, DO   250 mL at 11/03/11 1542   Current Outpatient Prescriptions  Medication Sig Dispense Refill  . acetaminophen (TYLENOL) 500 MG tablet Take 500 mg by mouth every 6 (six) hours as needed. For pain      . albuterol (PROVENTIL HFA;VENTOLIN HFA) 108 (90 BASE) MCG/ACT inhaler Inhale 2 puffs into the lungs every 6 (six) hours as needed. For shortness of breath      . ALPRAZolam (XANAX) 0.25 MG tablet Take 0.25 mg by mouth 2 (two) times daily.       [name] amLODipine (NORVASC) 10 MG tablet Take 10 mg by mouth daily.       [name] aspirin 81 MG tablet Take 81 mg by mouth daily.       . calcium carbonate (OS-CAL) 600 MG TABS Take 600 mg by mouth 3 (three) times daily with meals.       . cloNIDine (CATAPRES) 0.1 MG tablet Take 0.1 mg by mouth 2 (two) times daily.      . famotidine (PEPCID) 20 MG tablet Take 20 mg by mouth 2 (two) times daily.      . Ferrous Sulfate (IRON) 325 (65 FE) MG TABS Take 1 tablet by mouth 3 (three) times daily.       . fexofenadine (ALLEGRA) 180 MG tablet Take 180 mg by mouth daily as needed. For allergies      .  fish oil-omega-3 fatty acids 1000 MG capsule Take 1 g by mouth daily. Three times daily       . furosemide (LASIX) 80 MG tablet Take 80 mg by mouth daily.        [name] gemfibrozil (LOPID) 600 MG tablet Take 600 mg by mouth 2 (two) times daily before a meal.       . lisinopril (PRINIVIL,ZESTRIL) 40 MG tablet Take 40 mg by mouth daily.      . metFORMIN (GLUCOPHAGE) 1000 MG tablet Take 1,000 mg by mouth 2 (two) times daily with a meal.        . Multiple Vitamin (MULTIVITAMIN WITH MINERALS) TABS Take 1 tablet by mouth daily.      . pantoprazole (PROTONIX) 40 MG tablet Take 40 mg by mouth daily.        . potassium chloride (K-DUR) 10 MEQ tablet Take 10 mEq by mouth.        . rosuvastatin (CRESTOR) 40 MG tablet Take 40 mg by mouth every evening.       . sertraline (ZOLOFT) 100 MG tablet Take 200 mg by mouth daily.       . fluticasone (FLONASE) 50 MCG/ACT nasal spray Place 2 sprays into the nose daily as needed. For allergies      . DISCONTD: loratadine (CLARITIN) 10 MG tablet Take 10 mg by mouth daily.        [name] DISCONTD: losartan (COZAAR) 100 MG tablet Take 100 mg by mouth daily.        [name] DISCONTD: metoprolol tartrate (LOPRESSOR) 25 MG tablet Take 25 mg by mouth daily.         Allergies as of 11/03/2011  . (No Known Allergies)    History reviewed. No pertinent family history.  History   Social History  .  Marital Status: Married    Spouse Name: N/A    Number of Children: N/A  . Years of Education: N/A   Occupational History  . Not on file.   Social History Main Topics  . Smoking status: Current Every Day Smoker -- 1.0 packs/day for 36 years    Types: Cigarettes  . Smokeless tobacco: Never Used  . Alcohol Use: No  . Drug Use: No  . Sexually Active: Not on file   Other Topics Concern  . Not on file   Social History Narrative  . No narrative on file    Review of Systems: Pertinent positive and negative review of systems were noted in the above HPI section.  All other review of  systems was otherwise negative. Physical Exam: Vital signs in last 24 hours: Temp:  [98.2 F (36.8 C)-98.5 F (36.9 C)] 98.4 F (36.9 C) (09/12 1502) Pulse Rate:  [67-92] 71  (09/12 1600) Resp:  [3-17] 17  (09/12 1504) BP: (89-137)/(33-60) 89/51 mmHg (09/12 1600) SpO2:  [90 %-100 %] 90 % (09/12 1600)   General:   Alert,  Well-developed, well-nourished, pleasant and cooperative in NAD, husband at bedside Head:  Normocephalic and atraumatic. Eyes:  Sclera clear, no icterus.   Conjunctiva pale. Ears:  Normal auditory acuity. Nose:  No deformity, discharge,  or lesions. Mouth:  No deformity or lesions.   Neck:  Supple; endarterectomy scar. Lungs:  Clear throughout to auscultation.   No wheezes, crackles, or rhonchi. Heart:  Regular rate and rhythm; soft systolic murmu. Abdomen:  Soft, mild tenderness in the left lower quadrant and suprapubic area no guarding no rebound no palpable mass or hepatosplenomegaly bowel sounds are present     Rectal:  Not repeated documented loose brown stool per ER M.D. Hemoccult-positive  Msk:  Symmetrical without gross deformities. . Pulses:  Normal pulses noted. Extremities:  Without clubbing or edema. Neurologic:  Alert and  oriented x4;  grossly normal neurologically. Skin:  Intact without significant lesions or rashes.. Psych:  Alert and cooperative. Normal mood and affect.  Intake/Output from previous day:   Intake/Output this shift:    Lab Results:  Basename 11/03/11 1139  WBC 5.0  HGB 7.9*  HCT 24.7*  PLT 319   BMET  Basename 11/03/11 1139  NA 135  K 4.0  CL 96  CO2 28  GLUCOSE 182*  BUN 53*  CREATININE 1.51*  CALCIUM 9.4   LFT  Basename 11/03/11 1139  PROT 6.1  ALBUMIN 3.0*  AST 19  ALT 10  ALKPHOS 76  BILITOT 0.1*  BILIDIR --  IBILI --    Studies/Results: Ct Abdomen Pelvis Wo Contrast  11/03/2011  *RADIOLOGY REPORT*  Clinical Data: Rectal bleeding, cholecystectomy and appendectomy  CT ABDOMEN AND PELVIS WITHOUT  CONTRAST  Technique:  Multidetector CT imaging of the abdomen and pelvis was performed following the standard protocol without intravenous contrast.  Comparison: CT abdomen 12/10/2005  Findings: Lung bases are clear.  No pericardial fluid.  No focal hepatic lesion on this noncontrast exam.  Prior cholecystectomy.  The pancreas, spleen, adrenal glands, and kidneys are unchanged.  There is a cleft in the lower pole right kidney suggesting scarring.  The stomach, small bowel, cecum are normal.  Colon is normal.  The rectosigmoid colon is normal.  Abdominal aorta is normal caliber.  No retroperitoneal periportal lymphadenopathy.  No free fluid the pelvis.  The uterus and bladder and necks are normal.  Heavy intimal calcification of the iliac arteries.  No adenopathy.  Review of  bone windows demonstrates no aggressive osseous lesions.  IMPRESSION:  1.  No acute abdominal or pelvic findings. 2.  No colonic abnormality or rectal abnormality evident by CT.   Original Report Authenticated By: Genevive Bi, M.D.     IMPRESSION:  #45 61 year old female vasculopath presenting with diarrhea and complaints of dark blood in her stool as well as lower abdominal crampy discomfort over the past 4 days . I do not think she's had any significant GI bleeding her hemoglobin is actually higher than when checked 2 weeks ago. She may be a bit hemoconcentrated and wonder if she has had an ischemic colitis not evident on noncontrasted CT. Other possibility would be an infectious diarrhea/gastroenteritis. #2 reported history of intestinal AVMs #3 chronic iron deficiency anemia with baseline hemoglobin between 7 and a #4 coronary artery disease #5 cerebrovascular disease status post carotid endarterectomy and history of TIA #6 history of renal artery stenosis with prior intervention #7 history of left iliac stents #8 diabetes mellitus #9 COPD with continued smoking #10 history of diverticulosis and hyperplastic polyps at the time  of last colonoscopy 2008 #11 hypotension  PLAN: #1 gentle hydration, serial labs #2 reasonable to transfuse her though not convinced she's had significant GI bleeding #3 Will check stool studies #4 review old office records #5 clear liquid diet #6 PPI #7 reassess in a.m. and can determine at that time if any endoscopic procedures are indicated   Rajat Staver  11/03/2011, 4:58 PM

## 2011-11-03 NOTE — Progress Notes (Signed)
[name], is a 61 y.o. female,   MRN: [id number]  -  DOB - [birth year]  Outpatient Primary MD for the patient is Norberto Sorenson, MD  in for    Chief Complaint  Patient presents with  . Rectal Bleeding     Blood pressure 100/41, pulse 67, temperature 98.4 F (36.9 C), temperature source Oral, resp. rate 17, SpO2 97.00%.  Principal Problem:  *Acute blood loss anemia Active Problems:  DIABETES MELLITUS, TYPE II  OBESITY  MITRAL REGURGITATION  CAD  CHF  CEREBROVASCULAR DISEASE  COPD  DIVERTICULITIS, HX OF  Renal artery stenosis  Hypertension  AVM (arteriovenous malformation)  61 yo hx diverticulosis, gi bleed, anemia of chronic disease, iron def anemia, AVM, DM presents to [facility] cc bloody stools and abdominal pain x 2days.   Work up yields Hg 7.9 BUN 52 creatinine 1.5. Lipase WNL, FOBT + SBP range 90- 126 HR 70-90.   Given IV fluids, zofran and typed and screened.   Will admit to [facility].

## 2011-11-04 DIAGNOSIS — D649 Anemia, unspecified: Secondary | ICD-10-CM

## 2011-11-04 DIAGNOSIS — R197 Diarrhea, unspecified: Secondary | ICD-10-CM

## 2011-11-04 DIAGNOSIS — I059 Rheumatic mitral valve disease, unspecified: Secondary | ICD-10-CM

## 2011-11-04 LAB — CBC
HCT: 32.8 % — ABNORMAL LOW (ref 36.0–46.0)
Hemoglobin: 10.5 g/dL — ABNORMAL LOW (ref 12.0–15.0)
MCH: 31.5 pg (ref 26.0–34.0)
MCHC: 32.5 g/dL (ref 30.0–36.0)
MCHC: 32 g/dL (ref 30.0–36.0)
MCV: 96.8 fL (ref 78.0–100.0)
Platelets: 285 10*3/uL (ref 150–400)
RBC: 3.4 MIL/uL — ABNORMAL LOW (ref 3.87–5.11)
RBC: 3.38 MIL/uL — ABNORMAL LOW (ref 3.87–5.11)
RDW: 22.1 % — ABNORMAL HIGH (ref 11.5–15.5)

## 2011-11-04 LAB — BASIC METABOLIC PANEL
BUN: 34 mg/dL — ABNORMAL HIGH (ref 6–23)
Calcium: 8.4 mg/dL (ref 8.4–10.5)
GFR calc Af Amer: 62 mL/min — ABNORMAL LOW (ref >90)
GFR calc non Af Amer: 54 mL/min — ABNORMAL LOW (ref >90)
Glucose, Bld: 97 mg/dL (ref 70–99)
Potassium: 3.7 mEq/L (ref 3.5–5.1)

## 2011-11-04 LAB — HEMOGLOBIN A1C: Hgb A1c MFr Bld: 4.4 % (ref <5.7)

## 2011-11-04 LAB — TROPONIN I: Troponin I: <0.3 ng/mL (ref <0.30)

## 2011-11-04 LAB — GLUCOSE, CAPILLARY: Glucose-Capillary: 114 mg/dL — ABNORMAL HIGH (ref 70–99)

## 2011-11-04 MED ORDER — INSULIN ASPART 100 UNIT/ML ~~LOC~~ SOLN
0.0000 [IU] | Freq: Three times a day (TID) | SUBCUTANEOUS | Status: DC
  Administered 2011-11-04 – 2011-11-05 (×2): 2 [IU] via SUBCUTANEOUS

## 2011-11-04 NOTE — Progress Notes (Signed)
Nutrition Brief Note  Patient identified on the Malnutrition Screening Tool (MST) report for unintended weight loss, generating a score of 2.   Body mass index is 32.01 kg/(m^2). Pt meets criteria for class I obesity based on current BMI.   Pt reports nausea/diarrhea since Sunday PTA which was now resolved. Pt reports typically eating 2 meals/day. Pt reports she may have lost 4 pounds unintentionally in the past month. Pt states her blood sugars were normal at home. Current diet order is CHO modified medium, patient is consuming approximately 100% of meals at this time. Labs and medications reviewed. Pt eating excellent without any nutrition educational needs.  No nutrition interventions warranted at this time. If nutrition issues arise, please consult RD.  Levon Hedger MS, RD, LDN [phone] Pager [phone] After Hours Pager

## 2011-11-04 NOTE — Progress Notes (Signed)
[facility] PROGRESS NOTE  [name] ZOX:[id number] DOB: 1950/04/18 DOA: 11/03/2011 PCP: Burtis Junes, MD  Assessment/Plan: Principal Problem:  *Acute blood loss anemia Active Problems:  DIABETES MELLITUS, TYPE II  OBESITY  MITRAL REGURGITATION  CAD  CHF  CEREBROVASCULAR DISEASE  COPD  DIVERTICULITIS, HX OF  Renal artery stenosis  Hypertension  AVM (arteriovenous malformation)  GI bleed  Diarrhea  #8 61 year old female vasculopath presenting with diarrhea and complaints of dark blood in her stool as well as lower abdominal crampy discomfort over the past 4 days . I do not think she's had any significant GI bleeding her hemoglobin is actually higher than when checked 2 weeks ago. She may be a bit hemoconcentrated and wonder if she has had an ischemic colitis not evident on noncontrasted CT. Other possibility would be an infectious diarrhea/gastroenteritis. Initiated on Protonix drip , bleeding could have been secondary to Segmental ischemic colitis precipitated by hypotension Awaiting stool studies  #2 reported history of intestinal AVMs status post 2 units of packed red blood cells #3 chronic iron deficiency anemia with baseline hemoglobin between 7 and 8 , hemoglobin of greater than 10 today  #4 coronary artery disease hold aspirin  #5 cerebrovascular disease status post carotid endarterectomy and history of TIA again hold aspirin  #6 history of renal artery stenosis with prior intervention  #7 history of left iliac stents  #8 diabetes mellitus start sliding scale insulin , check hemoglobin A1c #9 COPD with continued smoking continue Flonase  #10 history of diverticulosis and hyperplastic polyps at the time of last colonoscopy 2008    #11 hypotension hold antihypertensive medications , reassess the 2-D echo, cardiac enzymes, discontinue clonidine     HPI/Subjective: Feeling better,mild abdominal pain in lower abdomen. Nurses report no real diarrhea or  bleeding since admit-had one BM with just mucoid material   Objective: Filed Vitals:   11/04/11 0300 11/04/11 0350 11/04/11 0500 11/04/11 0610  BP: 90/49 92/51  122/70  Pulse: 65 65  67  Temp: 98.2 F (36.8 C) 98.4 F (36.9 C)  98.2 F (36.8 C)  TempSrc:    Oral  Resp: 18 18  18   Height:   4\' 9"  (1.448 m) 4\' 9"  (1.448 m)  Weight:    67.1 kg (147 lb 14.9 oz)  SpO2: 90%   92%    Intake/Output Summary (Last 24 hours) at 11/04/11 1011 Last data filed at 11/04/11 0048  Gross per 24 hour  Intake    775 ml  Output    300 ml  Net    475 ml    Exam:  HENT:  Head: Atraumatic.  Nose: Nose normal.  Mouth/Throat: Oropharynx is clear and moist.  Eyes: Conjunctivae are normal. Pupils are equal, round, and reactive to light. No scleral icterus.  Neck: Neck supple. No tracheal deviation present.  Cardiovascular: Normal rate, regular rhythm, normal heart sounds and intact distal pulses.  Pulmonary/Chest: Effort normal and breath sounds normal. No respiratory distress.  Abdominal: Soft. Normal appearance and bowel sounds are normal. She exhibits no distension. There is no tenderness.  Musculoskeletal: She exhibits no edema and no tenderness.  Neurological: She is alert. No cranial nerve deficit.    Data Reviewed: Basic Metabolic Panel:  Lab 11/04/11 8119 11/03/11 1139 10/29/11 1002  NA 139 135 136  K 3.7 4.0 4.2  CL 105 96 96  CO2 28 28 28   GLUCOSE 97 182* 113*  BUN 34* 53* 49*  CREATININE 1.09 1.51* 1.33*  CALCIUM 8.4  9.4 10.5  MG -- -- --  PHOS -- -- --    Liver Function Tests:  Lab 11/03/11 2017 11/03/11 1139 10/29/11 1002  AST 21 19 19   ALT 9 10 10   ALKPHOS 69 76 78  BILITOT 0.1* 0.1* 0.1*  PROT 5.6* 6.1 6.8  ALBUMIN 2.8* 3.0* 3.6    Lab 11/03/11 1139  LIPASE 59  AMYLASE --   No results found for this basename: AMMONIA:5 in the last 168 hours  CBC:  Lab 11/04/11 0830 11/03/11 1609 11/03/11 1139 10/29/11 1002  WBC 4.5 4.4 5.0 8.9  NEUTROABS -- -- 3.5 7.0   HGB 10.7* 7.3* 7.9* 8.3*  HCT 32.9* 23.5* 24.7* 26.8*  MCV 96.8 103.5* 104.2* 104.3*  PLT 285 296 319 306    Cardiac Enzymes: No results found for this basename: CKTOTAL:5,CKMB:5,CKMBINDEX:5,TROPONINI:5 in the last 168 hours BNP (last 3 results)  Basename 11/03/11 2017 10/29/11 1002  PROBNP 922.7* 1049.0*     CBG: No results found for this basename: GLUCAP:5 in the last 168 hours  No results found for this or any previous visit (from the past 240 hour(s)).   Studies: Ct Abdomen Pelvis Wo Contrast  11/03/2011  *RADIOLOGY REPORT*  Clinical Data: Rectal bleeding, cholecystectomy and appendectomy  CT ABDOMEN AND PELVIS WITHOUT CONTRAST  Technique:  Multidetector CT imaging of the abdomen and pelvis was performed following the standard protocol without intravenous contrast.  Comparison: CT abdomen 12/10/2005  Findings: Lung bases are clear.  No pericardial fluid.  No focal hepatic lesion on this noncontrast exam.  Prior cholecystectomy.  The pancreas, spleen, adrenal glands, and kidneys are unchanged.  There is a cleft in the lower pole right kidney suggesting scarring.  The stomach, small bowel, cecum are normal.  Colon is normal.  The rectosigmoid colon is normal.  Abdominal aorta is normal caliber.  No retroperitoneal periportal lymphadenopathy.  No free fluid the pelvis.  The uterus and bladder and necks are normal.  Heavy intimal calcification of the iliac arteries.  No adenopathy. Review of  bone windows demonstrates no aggressive osseous lesions.  IMPRESSION:  1.  No acute abdominal or pelvic findings. 2.  No colonic abnormality or rectal abnormality evident by CT.   Original Report Authenticated By: Genevive Bi, M.D.    X-ray Chest Pa And Lateral   11/03/2011  *RADIOLOGY REPORT*  Clinical Data: Cough with shortness of breath.  Smoker.  CHEST - 2 VIEW  Comparison: 10/29/2011  Findings: AP upright film accentuates the heart size which is otherwise normal.  Baseline chronic bronchitic  change has progressed with new right basilar ill-defined opacity, potentially right middle and right lower lobe infiltrate.  No effusion. No left lung infiltrate. Bones unremarkable except for mild thoracic degenerative change.  IMPRESSION: Interval worsening aeration.  Increasing opacity at the right base which could represent right middle and/or right lower lobe pneumonia.   Original Report Authenticated By: Elsie Stain, M.D.    Dg Chest 2 View  10/29/2011  *RADIOLOGY REPORT*  Clinical Data: Shortness of breath, COPD, smoker  CHEST - 2 VIEW  Comparison: 11/06/2009; 11/05/2009; 12/13/2008  Findings: Grossly unchanged cardiac silhouette and mediastinal contours.  Atherosclerotic calcifications within the thoracic aorta.  Improved aeration of the bilateral lung bases with resolution of left-sided pleural effusion.  There is persistent blunting of the right costophrenic angle which may represent a residual small right-sided effusion versus pleural parenchymal thickening.  No new focal airspace opacities.  There is mild diffuse thickening of the pulmonary interstitium.  No pneumothorax. Unchanged bones.  Post cholecystectomy.  IMPRESSION: 1.  Chronic bronchitic change without acute cardiopulmonary disease. 2.  Near resolution of small bilateral effusions and bibasilar opacities with possible small residual right sided effusion versus pleural parenchymal thickening.   Original Report Authenticated By: Waynard Reeds, M.D.     Scheduled Meds:   . ALPRAZolam  0.25 mg Oral BID  . atorvastatin  10 mg Oral q1800  . ferrous sulfate  325 mg Oral TID  . fluticasone  2 spray Each Nare Daily  . influenza  inactive virus vaccine  0.5 mL Intramuscular Tomorrow-1000  . insulin aspart  0-9 Units Subcutaneous TID WC  . pantoprazole (PROTONIX) IV  80 mg Intravenous Once  . sertraline  200 mg Oral Daily  . sodium chloride  250 mL Intravenous Once  . DISCONTD: cloNIDine  0.1 mg Oral BID   Continuous Infusions:   .  sodium chloride    . pantoprozole (PROTONIX) infusion 8 mg/hr (11/04/11 0558)    Principal Problem:  *Acute blood loss anemia Active Problems:  DIABETES MELLITUS, TYPE II  OBESITY  MITRAL REGURGITATION  CAD  CHF  CEREBROVASCULAR DISEASE  COPD  DIVERTICULITIS, HX OF  Renal artery stenosis  Hypertension  AVM (arteriovenous malformation)  GI bleed  Diarrhea    Time spent: 40 minutes   [facility]  [facility] Pager [phone]. If 8PM-8AM, please contact night-coverage at [email or web address], password [facility] 11/04/2011, 10:11 AM  LOS: 1 day

## 2011-11-04 NOTE — Progress Notes (Signed)
Patient ID: [name], female   DOB: [birth year], 61 y.o.   MRN: [id number] [facility] Progress Note  Subjective: Feeling better,mild abdominal pain in lower abdomen. Nurses report no real diarrhea or bleeding since admit-had one BM with just  mucoid material.  Objective:  Vital signs in last 24 hours: Temp:  [98.2 F (36.8 C)-99 F (37.2 C)] 98.2 F (36.8 C) (09/13 0610) Pulse Rate:  [65-92] 67  (09/13 0610) Resp:  [3-18] 18  (09/13 0610) BP: (89-137)/(33-70) 122/70 mmHg (09/13 0610) SpO2:  [90 %-100 %] 92 % (09/13 0610) Weight:  [147 lb 14.9 oz (67.1 kg)] 147 lb 14.9 oz (67.1 kg) (09/13 0610) Last BM Date: 10/31/11 General:   Alert,  Well-developed,    in NAD Heart:  Regular rate and rhythm; no murmurs Pulm;clear Abdomen:  Soft, tender in LLQ and suprapubic area and nondistended. Normal bowel sounds, without guarding, and without rebound.   Extremities:  Without edema. Neurologic:  Alert and  oriented x4;  grossly normal neurologically. Psych:  Alert and cooperative. Normal mood and affect.  Intake/Output from previous day: 09/12 0701 - 09/13 0700 In: 775 [I.V.:750; Blood:25] Out: 300 [Urine:300] Intake/Output this shift:    Lab Results:  LABS and stool studies pending  Basename 11/03/11 1609 11/03/11 1139  WBC 4.4 5.0  HGB 7.3* 7.9*  HCT 23.5* 24.7*  PLT 296 319   BMET  Basename 11/03/11 1139  NA 135  K 4.0  CL 96  CO2 28  GLUCOSE 182*  BUN 53*  CREATININE 1.51*  CALCIUM 9.4   LFT  Basename 11/03/11 2017  PROT 5.6*  ALBUMIN 2.8*  AST 21  ALT 9  ALKPHOS 69  BILITOT 0.1*  BILIDIR <0.1  IBILI NOT CALCULATED     Assessment / Plan: #1 61 yo female vasculopath with weakness, hypotension, diarrhea and lower abdominal discomfort-reported dark stool and some blood  Improving- Suspect she has had a  Segmental ischemic colitis precipitated by hypotension- No need for  endoscopic eval currently Advance diet Await stool studies  #2  anemia-chronic fe deficiency- hx of AVM's- received 2 units of prbc's last  Pm #3 HX of HTN and on several BP meds at home- BP was low when last seen by cardiology  (syt 100)- need to adjust home meds  Principal Problem:  *Acute blood loss anemia Active Problems:  DIABETES MELLITUS, TYPE II  OBESITY  MITRAL REGURGITATION  CAD  CHF  CEREBROVASCULAR DISEASE  COPD  DIVERTICULITIS, HX OF  Renal artery stenosis  Hypertension  AVM (arteriovenous malformation)  GI bleed  Diarrhea     LOS: 1 day   [name]  11/04/2011, 9:00 AM

## 2011-11-04 NOTE — Progress Notes (Signed)
Echocardiogram 2D Echocardiogram has been performed.  [name] 11/04/2011, 11:36 AM

## 2011-11-04 NOTE — Progress Notes (Signed)
I have personally taken an interval history, reviewed the chart, and examined the patient.  I agree with the extender's note, impression and recommendations.  

## 2011-11-05 LAB — URINE CULTURE: Culture: NO GROWTH

## 2011-11-05 LAB — GLUCOSE, CAPILLARY: Glucose-Capillary: 165 mg/dL — ABNORMAL HIGH (ref 70–99)

## 2011-11-05 MED ORDER — FUROSEMIDE 80 MG PO TABS: 40.0000 mg | ORAL_TABLET | Freq: Every day | ORAL | Status: DC

## 2011-11-05 MED ORDER — LISINOPRIL 10 MG PO TABS: 40.0000 mg | ORAL_TABLET | Freq: Every day | ORAL | Status: DC

## 2011-11-05 NOTE — Progress Notes (Signed)
She is taken to her vehicle per w/c after receiving d/c instructions and prescriptions and signs for same.  This is achieved without incident.

## 2011-11-05 NOTE — Discharge Summary (Signed)
Physician Discharge Summary  [name] MRN: [id number] DOB/AGE: 1950/12/05 61 y.o.  PCP: Burtis Junes, MD   Admit date: 11/03/2011 Discharge date: 11/05/2011  Discharge Diagnoses:  :  *Acute blood loss anemia Active Problems:  DIABETES MELLITUS, TYPE II  OBESITY  MITRAL REGURGITATION  CAD  CHF  CEREBROVASCULAR DISEASE  COPD  DIVERTICULITIS, HX OF  Renal artery stenosis  Hypertension  AVM (arteriovenous malformation)  GI bleed  Diarrhea     Medication List     As of 11/05/2011 10:03 AM    STOP taking these medications         aspirin 81 MG tablet      famotidine 20 MG tablet   Commonly known as: PEPCID      TAKE these medications         acetaminophen 500 MG tablet   Commonly known as: TYLENOL   Take 500 mg by mouth every 6 (six) hours as needed. For pain      albuterol 108 (90 BASE) MCG/ACT inhaler   Commonly known as: PROVENTIL HFA;VENTOLIN HFA   Inhale 2 puffs into the lungs every 6 (six) hours as needed. For shortness of breath      ALPRAZolam 0.25 MG tablet   Commonly known as: XANAX   Take 0.25 mg by mouth 2 (two) times daily.      amLODipine 10 MG tablet   Commonly known as: NORVASC   Take 10 mg by mouth daily.      calcium carbonate 600 MG Tabs   Commonly known as: OS-CAL   Take 600 mg by mouth 3 (three) times daily with meals.      cloNIDine 0.1 MG tablet   Commonly known as: CATAPRES   Take 0.1 mg by mouth 2 (two) times daily.      fexofenadine 180 MG tablet   Commonly known as: ALLEGRA   Take 180 mg by mouth daily as needed. For allergies      fish oil-omega-3 fatty acids 1000 MG capsule   Take 1 g by mouth daily. Three times daily      fluticasone 50 MCG/ACT nasal spray   Commonly known as: FLONASE   Place 2 sprays into the nose daily as needed. For allergies      furosemide 80 MG tablet   Commonly known as: LASIX   Take 0.5 tablets (40 mg total) by mouth daily.      gemfibrozil 600 MG tablet   Commonly  known as: LOPID   Take 600 mg by mouth 2 (two) times daily before a meal.      Iron 325 (65 FE) MG Tabs   Take 1 tablet by mouth 3 (three) times daily.      lisinopril 10 MG tablet   Commonly known as: PRINIVIL,ZESTRIL   Take 4 tablets (40 mg total) by mouth daily.      metFORMIN 1000 MG tablet   Commonly known as: GLUCOPHAGE   Take 1,000 mg by mouth 2 (two) times daily with a meal.      multivitamin with minerals Tabs   Take 1 tablet by mouth daily.      pantoprazole 40 MG tablet   Commonly known as: PROTONIX   Take 40 mg by mouth daily.      potassium chloride 10 MEQ tablet   Commonly known as: K-DUR   Take 10 mEq by mouth.      rosuvastatin 40 MG tablet   Commonly known as: CRESTOR  Take 40 mg by mouth every evening.      ZOLOFT 100 MG tablet   Generic drug: sertraline   Take 200 mg by mouth daily.        Discharge Condition: stable   Disposition: 01-Home or Self Care   Consults:     Significant Diagnostic Studies: Ct Abdomen Pelvis Wo Contrast  11/03/2011  *RADIOLOGY REPORT*  Clinical Data: Rectal bleeding, cholecystectomy and appendectomy  CT ABDOMEN AND PELVIS WITHOUT CONTRAST  Technique:  Multidetector CT imaging of the abdomen and pelvis was performed following the standard protocol without intravenous contrast.  Comparison: CT abdomen 12/10/2005  Findings: Lung bases are clear.  No pericardial fluid.  No focal hepatic lesion on this noncontrast exam.  Prior cholecystectomy.  The pancreas, spleen, adrenal glands, and kidneys are unchanged.  There is a cleft in the lower pole right kidney suggesting scarring.  The stomach, small bowel, cecum are normal.  Colon is normal.  The rectosigmoid colon is normal.  Abdominal aorta is normal caliber.  No retroperitoneal periportal lymphadenopathy.  No free fluid the pelvis.  The uterus and bladder and necks are normal.  Heavy intimal calcification of the iliac arteries.  No adenopathy. Review of  bone windows  demonstrates no aggressive osseous lesions.  IMPRESSION:  1.  No acute abdominal or pelvic findings. 2.  No colonic abnormality or rectal abnormality evident by CT.   Original Report Authenticated By: Genevive Bi, M.D.    X-ray Chest Pa And Lateral   11/03/2011  *RADIOLOGY REPORT*  Clinical Data: Cough with shortness of breath.  Smoker.  CHEST - 2 VIEW  Comparison: 10/29/2011  Findings: AP upright film accentuates the heart size which is otherwise normal.  Baseline chronic bronchitic change has progressed with new right basilar ill-defined opacity, potentially right middle and right lower lobe infiltrate.  No effusion. No left lung infiltrate. Bones unremarkable except for mild thoracic degenerative change.  IMPRESSION: Interval worsening aeration.  Increasing opacity at the right base which could represent right middle and/or right lower lobe pneumonia.   Original Report Authenticated By: Elsie Stain, M.D.    Dg Chest 2 View  10/29/2011  *RADIOLOGY REPORT*  Clinical Data: Shortness of breath, COPD, smoker  CHEST - 2 VIEW  Comparison: 11/06/2009; 11/05/2009; 12/13/2008  Findings: Grossly unchanged cardiac silhouette and mediastinal contours.  Atherosclerotic calcifications within the thoracic aorta.  Improved aeration of the bilateral lung bases with resolution of left-sided pleural effusion.  There is persistent blunting of the right costophrenic angle which may represent a residual small right-sided effusion versus pleural parenchymal thickening.  No new focal airspace opacities.  There is mild diffuse thickening of the pulmonary interstitium.  No pneumothorax. Unchanged bones.  Post cholecystectomy.  IMPRESSION: 1.  Chronic bronchitic change without acute cardiopulmonary disease. 2.  Near resolution of small bilateral effusions and bibasilar opacities with possible small residual right sided effusion versus pleural parenchymal thickening.   Original Report Authenticated By: Waynard Reeds, M.D.        Microbiology: Recent Results (from the past 240 hour(s))  URINE CULTURE     Status: Normal   Collection Time   11/03/11 11:40 AM      Component Value Range Status Comment   Specimen Description URINE, CLEAN CATCH   Final    Special Requests NONE   Final    Culture  Setup Time 11/03/2011 22:37   Final    Colony Count NO GROWTH   Final    Culture NO  GROWTH   Final    Report Status 11/05/2011 FINAL   Final   CLOSTRIDIUM DIFFICILE BY PCR     Status: Normal   Collection Time   11/04/11  7:37 PM      Component Value Range Status Comment   C difficile by pcr NEGATIVE  NEGATIVE Final      Labs: Results for orders placed during the hospital encounter of 11/03/11 (from the past 48 hour(s))  OCCULT BLOOD, POC DEVICE     Status: Normal   Collection Time   11/03/11 11:36 AM      Component Value Range Comment   Fecal Occult Bld POSITIVE     CBC WITH DIFFERENTIAL     Status: Abnormal   Collection Time   11/03/11 11:39 AM      Component Value Range Comment   WBC 5.0  4.0 - 10.5 K/uL    RBC 2.37 (*) 3.87 - 5.11 MIL/uL    Hemoglobin 7.9 (*) 12.0 - 15.0 g/dL    HCT 78.2 (*) 95.6 - 46.0 %    MCV 104.2 (*) 78.0 - 100.0 fL    MCH 33.3  26.0 - 34.0 pg    MCHC 32.0  30.0 - 36.0 g/dL    RDW 21.3 (*) 08.6 - 15.5 %    Platelets 319  150 - 400 K/uL    Neutrophils Relative 70  43 - 77 %    Neutro Abs 3.5  1.7 - 7.7 K/uL    Lymphocytes Relative 17  12 - 46 %    Lymphs Abs 0.9  0.7 - 4.0 K/uL    Monocytes Relative 11  3 - 12 %    Monocytes Absolute 0.6  0.1 - 1.0 K/uL    Eosinophils Relative 1  0 - 5 %    Eosinophils Absolute 0.1  0.0 - 0.7 K/uL    Basophils Relative 1  0 - 1 %    Basophils Absolute 0.0  0.0 - 0.1 K/uL   COMPREHENSIVE METABOLIC PANEL     Status: Abnormal   Collection Time   11/03/11 11:39 AM      Component Value Range Comment   Sodium 135  135 - 145 mEq/L    Potassium 4.0  3.5 - 5.1 mEq/L    Chloride 96  96 - 112 mEq/L    CO2 28  19 - 32 mEq/L    Glucose, Bld 182 (*)  70 - 99 mg/dL    BUN 53 (*) 6 - 23 mg/dL    Creatinine, Ser 5.78 (*) 0.50 - 1.10 mg/dL    Calcium 9.4  8.4 - 46.9 mg/dL    Total Protein 6.1  6.0 - 8.3 g/dL    Albumin 3.0 (*) 3.5 - 5.2 g/dL    AST 19  0 - 37 U/L    ALT 10  0 - 35 U/L    Alkaline Phosphatase 76  39 - 117 U/L    Total Bilirubin 0.1 (*) 0.3 - 1.2 mg/dL    GFR calc non Af Amer 36 (*) >90 mL/min    GFR calc Af Amer 42 (*) >90 mL/min   LIPASE, BLOOD     Status: Normal   Collection Time   11/03/11 11:39 AM      Component Value Range Comment   Lipase 59  11 - 59 U/L   URINALYSIS, ROUTINE W REFLEX MICROSCOPIC     Status: Normal   Collection Time   11/03/11 11:40 AM  Component Value Range Comment   Color, Urine YELLOW  YELLOW    APPearance CLEAR  CLEAR    Specific Gravity, Urine 1.018  1.005 - 1.030    pH 5.5  5.0 - 8.0    Glucose, UA NEGATIVE  NEGATIVE mg/dL    Hgb urine dipstick NEGATIVE  NEGATIVE    Bilirubin Urine NEGATIVE  NEGATIVE    Ketones, ur NEGATIVE  NEGATIVE mg/dL    Protein, ur NEGATIVE  NEGATIVE mg/dL    Urobilinogen, UA 0.2  0.0 - 1.0 mg/dL    Nitrite NEGATIVE  NEGATIVE    Leukocytes, UA NEGATIVE  NEGATIVE MICROSCOPIC NOT DONE ON URINES WITH NEGATIVE PROTEIN, BLOOD, LEUKOCYTES, NITRITE, OR GLUCOSE <1000 mg/dL.  URINE CULTURE     Status: Normal   Collection Time   11/03/11 11:40 AM      Component Value Range Comment   Specimen Description URINE, CLEAN CATCH      Special Requests NONE      Culture  Setup Time 11/03/2011 22:37      Colony Count NO GROWTH      Culture NO GROWTH      Report Status 11/05/2011 FINAL     TYPE AND SCREEN     Status: Normal (Preliminary result)   Collection Time   11/03/11  4:09 PM      Component Value Range Comment   ABO/RH(D) O POS      Antibody Screen NEG      Sample Expiration 11/06/2011      Unit Number [id number]      Blood Component Type RED CELLS,LR      Unit division 00      Status of Unit ISSUED      Transfusion Status OK TO TRANSFUSE      Crossmatch  Result Compatible      Unit Number [id number]      Blood Component Type RED CELLS,LR      Unit division 00      Status of Unit ISSUED,FINAL      Transfusion Status OK TO TRANSFUSE      Crossmatch Result Compatible     CBC     Status: Abnormal   Collection Time   11/03/11  4:09 PM      Component Value Range Comment   WBC 4.4  4.0 - 10.5 K/uL    RBC 2.27 (*) 3.87 - 5.11 MIL/uL    Hemoglobin 7.3 (*) 12.0 - 15.0 g/dL    HCT 96.2 (*) 95.2 - 46.0 %    MCV 103.5 (*) 78.0 - 100.0 fL    MCH 32.2  26.0 - 34.0 pg    MCHC 31.1  30.0 - 36.0 g/dL    RDW 84.1 (*) 32.4 - 15.5 %    Platelets 296  150 - 400 K/uL   RETICULOCYTES     Status: Abnormal   Collection Time   11/03/11  4:09 PM      Component Value Range Comment   Retic Ct Pct 10.6 (*) 0.4 - 3.1 %    RBC. 2.26 (*) 3.87 - 5.11 MIL/uL    Retic Count, Manual 239.6 (*) 19.0 - 186.0 K/uL   PREPARE RBC (CROSSMATCH)     Status: Normal   Collection Time   11/03/11  5:00 PM      Component Value Range Comment   Order Confirmation ORDER PROCESSED BY BLOOD BANK     HEPATIC FUNCTION PANEL     Status: Abnormal   Collection Time  11/03/11  8:17 PM      Component Value Range Comment   Total Protein 5.6 (*) 6.0 - 8.3 g/dL    Albumin 2.8 (*) 3.5 - 5.2 g/dL    AST 21  0 - 37 U/L    ALT 9  0 - 35 U/L    Alkaline Phosphatase 69  39 - 117 U/L    Total Bilirubin 0.1 (*) 0.3 - 1.2 mg/dL    Bilirubin, Direct <8.6  0.0 - 0.3 mg/dL    Indirect Bilirubin NOT CALCULATED  0.3 - 0.9 mg/dL   PRO B NATRIURETIC PEPTIDE     Status: Abnormal   Collection Time   11/03/11  8:17 PM      Component Value Range Comment   Pro B Natriuretic peptide (BNP) 922.7 (*) 0 - 125 pg/mL   BASIC METABOLIC PANEL     Status: Abnormal   Collection Time   11/04/11  8:30 AM      Component Value Range Comment   Sodium 139  135 - 145 mEq/L    Potassium 3.7  3.5 - 5.1 mEq/L    Chloride 105  96 - 112 mEq/L    CO2 28  19 - 32 mEq/L    Glucose, Bld 97  70 - 99 mg/dL    BUN 34 (*) 6 -  23 mg/dL    Creatinine, Ser 5.78  0.50 - 1.10 mg/dL    Calcium 8.4  8.4 - 46.9 mg/dL    GFR calc non Af Amer 54 (*) >90 mL/min    GFR calc Af Amer 62 (*) >90 mL/min   CBC     Status: Abnormal   Collection Time   11/04/11  8:30 AM      Component Value Range Comment   WBC 4.5  4.0 - 10.5 K/uL    RBC 3.40 (*) 3.87 - 5.11 MIL/uL    Hemoglobin 10.7 (*) 12.0 - 15.0 g/dL    HCT 62.9 (*) 52.8 - 46.0 %    MCV 96.8  78.0 - 100.0 fL    MCH 31.5  26.0 - 34.0 pg    MCHC 32.5  30.0 - 36.0 g/dL    RDW 41.3 (*) 24.4 - 15.5 %    Platelets 285  150 - 400 K/uL   HEMOGLOBIN A1C     Status: Normal   Collection Time   11/04/11 11:20 AM      Component Value Range Comment   Hemoglobin A1C 4.4  <5.7 %    Mean Plasma Glucose 80  <117 mg/dL   TROPONIN I     Status: Normal   Collection Time   11/04/11 11:20 AM      Component Value Range Comment   Troponin I <0.30  <0.30 ng/mL   CBC     Status: Abnormal   Collection Time   11/04/11 11:20 AM      Component Value Range Comment   WBC 4.7  4.0 - 10.5 K/uL    RBC 3.38 (*) 3.87 - 5.11 MIL/uL    Hemoglobin 10.5 (*) 12.0 - 15.0 g/dL    HCT 01.0 (*) 27.2 - 46.0 %    MCV 97.0  78.0 - 100.0 fL    MCH 31.1  26.0 - 34.0 pg    MCHC 32.0  30.0 - 36.0 g/dL    RDW 53.6 (*) 64.4 - 15.5 %    Platelets 274  150 - 400 K/uL   GLUCOSE, CAPILLARY  Status: Normal   Collection Time   11/04/11 11:34 AM      Component Value Range Comment   Glucose-Capillary 97  70 - 99 mg/dL   TROPONIN I     Status: Normal   Collection Time   11/04/11  3:55 PM      Component Value Range Comment   Troponin I <0.30  <0.30 ng/mL   GLUCOSE, CAPILLARY     Status: Abnormal   Collection Time   11/04/11  6:09 PM      Component Value Range Comment   Glucose-Capillary 188 (*) 70 - 99 mg/dL   CLOSTRIDIUM DIFFICILE BY PCR     Status: Normal   Collection Time   11/04/11  7:37 PM      Component Value Range Comment   C difficile by pcr NEGATIVE  NEGATIVE   GLUCOSE, CAPILLARY     Status: Abnormal    Collection Time   11/04/11  9:08 PM      Component Value Range Comment   Glucose-Capillary 114 (*) 70 - 99 mg/dL    Comment 1 Notify RN     TROPONIN I     Status: Normal   Collection Time   11/04/11 10:15 PM      Component Value Range Comment   Troponin I <0.30  <0.30 ng/mL   GLUCOSE, CAPILLARY     Status: Abnormal   Collection Time   11/05/11  7:48 AM      Component Value Range Comment   Glucose-Capillary 165 (*) 70 - 99 mg/dL      HPI :URSA MARCHESI is a 61 y.o. female with multiple medical problems. [facility] and has not been seen by [facility] since 2008. She has history of peripheral vascular disease and coronary artery disease as well as diastolic congestive heart failure and COPD. She also has adult onset diabetes mellitus and history of a TIA. She is status post carotid endarterectomy has history of renal artery stenosis status post previous intervention and has had a left common iliac and external iliac artery stent placement. She is not anticoagulated but is on a baby aspirin daily.  She also has history of chronic anemia which by oncology notes is felt secondary to chronic GI blood loss with history of intestinal AVMs. Her baseline hemoglobin appears to be in the 7-8 range. She does take oral iron on a daily basis and also receives periodic very heme infusions. She had an iron infusion in February and then again in mid August of this year She had a CBC done on August 27 showing hemoglobin of 7 hematocrit of 21.3 and an MCV of 96.  She comes to the emergency room on 11/03/2011, complaining of generalized weakness and dark diarrhea. She says she had onset of symptoms on Sunday, 10/30/2011 with crampy abdominal pain and then in: And diarrhea which she says was very dark and watery. She is a poor historian but think she probably had 4-5 bowel movements that day she does not remember having any nausea vomiting or diaphoresis. She does not  think she's had a recent antibiotics. Now over the past 3 days she has continued to have frequent loose stools primarily in continent and says she has passed some dark red blood. When asked to try to quantify the amount of bleeding she says she's been having more squirts of stools  She had recent evaluation with Dr. Antoine Poche for cardiology at that time was  noted to have a blood pressure 100/41, meds were not changed.   In the ER blood pressure 87/47 pulse in the 80s. Exam per ER physician showed soft brown Hemoccult-positive stool no gross blood and no gross melena.  CT of the abdomen and pelvis was done in the ER without any contrast and does not show any abnormality in the colon.  She is admitted for further diagnostic workup. Interestingly her hemoglobin today is 7.9 hematocrit of 24.7 BUN is elevated at 53 and creatinine elevated at 1.5 WBC is normal  Last colonoscopy was done in 2008 per Dr. Marina Goodell she has diverticulosis and had hyperplastic polyps at that time I am waiting for records of an endoscopy done in 2006 and apparently also had a capsule endoscopy around that same time no I. could not find in the computer system.   HOSPITAL COURSE: #1 anemia patient has diffuse peripheral vascular disease., History of AVM. She is also on ferrous sulfate by mouth. She is also on aspirin. Gastroenterology was consulted. This elderly hemoglobin was at baseline. The patient did have some dark stool associated with lower abdominal cramping consistent with transient ischemic colitis. Stool studies were sent and have been found to be negative as far. Given her history of coronary artery disease she did receive 2 units of packed red blood cells. Following transfusion the hemoglobin has improved to 10.7  #2 hypotension patient was found to have low blood pressure upon admission she was fluid resuscitated. Transfuse with packed red blood cells. Blood pressure is more stable. Will resume clonidine, Norvasc, but  decrease the dose of lisinopril. Given her history of coronary artery disease. The patient had a 2-D echo she was ruled out for acute coronary syndrome a 2-D echo showed an except we'll ejection fraction  #3 diabetes mellitus hemoglobin A1c was only found to be 4.4. The patient will continue with her outpatient regimen  #4 history of CVA/TIA hold aspirin until okay with primary care provider  #5 COPD continue Flonase stable    Discharge Exam:  Blood pressure 153/51, pulse 98, temperature 99.3 F (37.4 C), temperature source Oral, resp. rate 18, height 4\' 9"  (1.448 m), weight 67.903 kg (149 lb 11.2 oz), SpO2 95.00%.   General: Alert, Well-developed, in NAD  Heart: Regular rate and rhythm; no murmurs  Pulm;clear  Abdomen: Soft, tender in LLQ and suprapubic area and nondistended. Normal bowel sounds, without guarding, and without rebound.  Extremities: Without edema.  Neurologic: Alert and oriented x4; grossly normal neurologically.  Psych: Alert and cooperative. Normal mood and affect.          Discharge Orders    Future Appointments: Provider: Department: Dept Phone: Center:   11/28/2011 10:45 AM Delcie Roch [facility] [phone] None   11/30/2011 11:00 AM Keitha Butte, NP [facility] [phone] None   12/27/2011 9:00 AM Windell Hummingbird [facility] [phone] None   12/29/2011 9:00 AM Laurice Record, MD [facility] [phone] None      Follow-up Information    Follow up with Burtis Junes, MD. On 11/14/2011.   Contact information:   [facility] [address] [place]  [phone]          Signed: Richarda Overlie 11/05/2011, 10:03 AM

## 2011-11-06 LAB — TYPE AND SCREEN

## 2011-11-08 LAB — IRON AND TIBC: UIBC: 373 ug/dL (ref 125–400)

## 2011-11-08 LAB — FOLATE: Folate: >20 ng/mL

## 2011-11-09 LAB — TSH: TSH: 0.926 u[IU]/mL (ref 0.350–4.500)

## 2011-11-10 LAB — CULTURE, BLOOD (ROUTINE X 2): Culture: NO GROWTH

## 2011-11-28 ENCOUNTER — Other Ambulatory Visit (HOSPITAL_BASED_OUTPATIENT_CLINIC_OR_DEPARTMENT_OTHER): Payer: Self-pay | Admitting: Lab

## 2011-11-28 DIAGNOSIS — D539 Nutritional anemia, unspecified: Secondary | ICD-10-CM

## 2011-11-28 LAB — CBC WITH DIFFERENTIAL/PLATELET
BASO%: 1 % (ref 0.0–2.0)
Eosinophils Absolute: 0.1 10*3/uL (ref 0.0–0.5)
MCHC: 33.6 g/dL (ref 31.5–36.0)
MONO#: 0.5 10*3/uL (ref 0.1–0.9)
NEUT#: 5.3 10*3/uL (ref 1.5–6.5)
RBC: 2.49 10*6/uL — ABNORMAL LOW (ref 3.70–5.45)
RDW: 17.4 % — ABNORMAL HIGH (ref 11.2–14.5)
WBC: 7.3 10*3/uL (ref 3.9–10.3)
lymph#: 1.4 10*3/uL (ref 0.9–3.3)

## 2011-11-28 LAB — BASIC METABOLIC PANEL (CC13)
CO2: 26 mEq/L (ref 22–29)
Glucose: 107 mg/dl — ABNORMAL HIGH (ref 70–99)
Potassium: 4.5 mEq/L (ref 3.5–5.1)
Sodium: 138 mEq/L (ref 136–145)

## 2011-11-28 LAB — IRON AND TIBC
Iron: 92 ug/dL (ref 42–145)
UIBC: 400 ug/dL (ref 125–400)

## 2011-11-28 LAB — FERRITIN: Ferritin: 44 ng/mL (ref 10–291)

## 2011-11-30 ENCOUNTER — Ambulatory Visit (HOSPITAL_BASED_OUTPATIENT_CLINIC_OR_DEPARTMENT_OTHER): Payer: Self-pay

## 2011-11-30 ENCOUNTER — Ambulatory Visit (HOSPITAL_BASED_OUTPATIENT_CLINIC_OR_DEPARTMENT_OTHER): Payer: Self-pay | Admitting: Family

## 2011-11-30 ENCOUNTER — Telehealth: Payer: Self-pay | Admitting: Hematology and Oncology

## 2011-11-30 ENCOUNTER — Encounter: Payer: Self-pay | Admitting: Family

## 2011-11-30 ENCOUNTER — Telehealth: Payer: Self-pay | Admitting: *Deleted

## 2011-11-30 VITALS — BP 110/46 | HR 80 | Temp 98.1°F | Resp 20 | Ht <= 58 in | Wt 143.1 lb

## 2011-11-30 DIAGNOSIS — D638 Anemia in other chronic diseases classified elsewhere: Secondary | ICD-10-CM

## 2011-11-30 DIAGNOSIS — D509 Iron deficiency anemia, unspecified: Secondary | ICD-10-CM

## 2011-11-30 DIAGNOSIS — D539 Nutritional anemia, unspecified: Secondary | ICD-10-CM

## 2011-11-30 MED ORDER — SODIUM CHLORIDE 0.9 % IV SOLN
1020.0000 mg | Freq: Once | INTRAVENOUS | Status: AC
  Administered 2011-11-30: 1020 mg via INTRAVENOUS
  Filled 2011-11-30: qty 34

## 2011-11-30 MED ORDER — SODIUM CHLORIDE 0.9 % IV SOLN
Freq: Once | INTRAVENOUS | Status: AC
  Administered 2011-11-30: 20 mL via INTRAVENOUS

## 2011-11-30 NOTE — Telephone Encounter (Signed)
Per staff phone call and POF I have scheduled appt. JMW  

## 2011-11-30 NOTE — Progress Notes (Signed)
Patient ID: [name], female   DOB: 1950/03/25, 61 y.o.   MRN: [id number] CSN: [id number]  CC: Norberto Sorenson, MD  [name]. Hyacinth Meeker, M.D.  Wilhemina Bonito. Marina Goodell, MD  Identifying Statement: [name] is a 61 y.o. Caucasian female with anemia who presents for follow-up.   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  She was last seen in our office on 10/19/2011.  Since she was last seen in our office, she received a Feraheme infusion on 10/19/2011 and was hospitalized from 11/03/2011 - 11/05/2011 for a GI bleed.  The patient stated she went to the ER because she was passing blood clots from her rectum.  While hospitalized, the patient received a blood transfusion x 2 on 11/04/2011. [name] was seen by Dr. Marina Goodell, Gastroenterologist and Mike Gip, PA-C while hospitalized.  The patient states that she has been fatigued and has ongoing body aches, but denies any other complaints.  She says her body aches are well controled with Tylenol, PRN.  [name] specifically denies SOB, dizziness, fever, night sweats, N/V/D or constipation and any unusual bleeding/bruising.  The patient continues to smoke cigarettes, but states she is down to 4 cigarettes daily.  Smoking cessation was discussed with the patient.  The patient's iron level is at 44 today up from  17 on 10/18/2011.  The patient will receive a Feraheme infusion today.  The patient denies any other symptomatology.  Medications: Current Outpatient Prescriptions  Medication Sig Dispense Refill  . acetaminophen (TYLENOL) 500 MG tablet Take 500 mg by mouth every 6 (six) hours as needed. For pain      . albuterol (PROVENTIL HFA;VENTOLIN HFA) 108 (90 BASE) MCG/ACT inhaler Inhale 2 puffs into the lungs every 6 (six) hours as needed. For shortness of breath       . Alendronate Sodium (FOSAMAX PO) Take 1 tablet by mouth once a week.      . ALPRAZolam (XANAX) 0.25 MG  tablet Take 0.25 mg by mouth 2 (two) times daily.       [name] amLODipine (NORVASC) 10 MG tablet Take 10 mg by mouth daily.       . calcium carbonate (OS-CAL) 600 MG TABS Take 600 mg by mouth 3 (three) times daily with meals.       . cloNIDine (CATAPRES) 0.1 MG tablet Take 0.1 mg by mouth 2 (two) times daily.      . Ferrous Sulfate (IRON) 325 (65 FE) MG TABS Take 1 tablet by mouth 3 (three) times daily.       . fexofenadine (ALLEGRA) 180 MG tablet Take 180 mg by mouth daily as needed. For allergies       . fish oil-omega-3 fatty acids 1000 MG capsule Take 1 g by mouth 3 (three) times daily. Three times daily      . fluticasone (FLONASE) 50 MCG/ACT nasal spray Place 2 sprays into the nose daily as needed. For allergies       . furosemide (LASIX) 80 MG tablet Take 0.5 tablets (40 mg total) by mouth daily.  30 tablet  0  . gemfibrozil (LOPID) 600 MG tablet Take 600 mg by mouth 2 (two) times daily before a meal.       . lisinopril (PRINIVIL,ZESTRIL) 10 MG tablet Take 4 tablets (40 mg total) by mouth daily.  30 tablet  0  . metFORMIN (GLUCOPHAGE) 1000 MG tablet Take 1,000 mg  by mouth 2 (two) times daily with a meal.        . Multiple Vitamin (MULTIVITAMIN WITH MINERALS) TABS Take 1 tablet by mouth daily.      . pantoprazole (PROTONIX) 40 MG tablet Take 40 mg by mouth daily.        . potassium chloride (K-DUR) 10 MEQ tablet Take 10 mEq by mouth.        . rosuvastatin (CRESTOR) 40 MG tablet Take 40 mg by mouth every evening.       . sertraline (ZOLOFT) 100 MG tablet Take 200 mg by mouth daily.       [name] DISCONTD: loratadine (CLARITIN) 10 MG tablet Take 10 mg by mouth daily.        [name] DISCONTD: losartan (COZAAR) 100 MG tablet Take 100 mg by mouth daily.        [name] DISCONTD: metoprolol tartrate (LOPRESSOR) 25 MG tablet Take 25 mg by mouth daily.         Allergies: No Known Allergies   Family History: Family History  Problem Relation Age of Onset  . Hypertension Mother   . Heart attack Mother   .  Hypertension Father   . Angina Father   . Cancer Sister   . Cancer Brother     Social History: History  Substance Use Topics  . Smoking status: Current Every Day Smoker -- 0.2 packs/day for 36 years    Types: Cigarettes  . Smokeless tobacco: Never Used  . Alcohol Use: No    Review of Systems: 10 point review of systems was completed and is negative except as noted above.   Physical Exam: Blood pressure 110/46, pulse 80, temperature 98.1 F (36.7 C), temperature source Oral, resp. rate 20, height 4\' 9"  (1.448 m), weight 143 lb 1.6 oz (64.91 kg).  General appearance: Alert, cooperative, well nourished, no apparent distress Head: Normocephalic, without obvious abnormality, atraumatic Eyes: Conjunctivae clear, PERRLA, EOMI    Nose: Nares, septum and mucosa are normal, no drainage or sinus tenderness Neck: Cervical adenopathy bilaterally, supple, symmetrical, trachea midline, no tenderness Resp: Clear to auscultation bilaterally, diminished bibasilar breath sounds Cardio: Regular rate and rhythm, S1, S2 normal, 1/6 systolic murmur, no click, rub or gallop GI: Soft, non-tender, distended, hypoactive bowel sounds, no organomegaly Extremities: Extremities normal, atraumatic, no cyanosis or edema Neurologic: Grossly normal   Laboratory Data: Sodium 138, potassium 4.5, chloride 99, CO2 26, BUN 51.0, creatinine 1.3, calcium 9.8, glucose 107, iron 92, UIBC 400, TIBC 492, ferritin 44, WBC 7.3, RBCs 2.49, hemoglobin 8.2, hematocrit 24.3, MCV 97.4, platelets 260, ANC 5.3  Impression/Plan: [name] is a 61 year old woman with a history of iron-deficiency anemia and anemia of chronic disease.  The patient also has a history of diabetes, arteriovenous malformation and GI bleeding.  The patient will receive a Feraheme infusion today and she is  urged her to continue taking PO ferrous sulfate TID. Smoking cessation was also encouraged.  The patient has secured a PCP and has an appointment to see  her new PCP this month.   [name] was advised to go to the ER if she is SOB or experiencing any further rectal bleeding. She will follow-up with [place] in 4 months' time with the laboratories of CBC, BMP, Iron/TIBC and Ferritin to be obtained in advance of her office appointment. The patient will contact us in the interim if she has any questions or concerns.  The plan of care for [name] was discussed with Dr. Dalene Carrow.  [name] Greenspan NP-C 11/30/2011, 10:56 AM

## 2011-11-30 NOTE — Patient Instructions (Addendum)
Continue to drink plenty of water and keep well hydrated every day. Continue to take oral iron as directed. Call us if you have any questions or concerns.

## 2011-11-30 NOTE — Telephone Encounter (Signed)
gv and printed appt schedule for pt Nov and Feb

## 2011-12-27 ENCOUNTER — Ambulatory Visit (HOSPITAL_BASED_OUTPATIENT_CLINIC_OR_DEPARTMENT_OTHER): Payer: Self-pay

## 2011-12-27 ENCOUNTER — Other Ambulatory Visit: Payer: Self-pay | Admitting: *Deleted

## 2011-12-27 ENCOUNTER — Other Ambulatory Visit (HOSPITAL_BASED_OUTPATIENT_CLINIC_OR_DEPARTMENT_OTHER): Payer: Self-pay | Admitting: Lab

## 2011-12-27 ENCOUNTER — Ambulatory Visit: Payer: Self-pay | Admitting: Lab

## 2011-12-27 ENCOUNTER — Telehealth: Payer: Self-pay | Admitting: *Deleted

## 2011-12-27 ENCOUNTER — Encounter (HOSPITAL_COMMUNITY)
Admission: RE | Admit: 2011-12-27 | Discharge: 2011-12-27 | Disposition: A | Discharge status: Home / Self Care | Payer: Self-pay | Source: Ambulatory Visit | Attending: Hematology and Oncology | Admitting: Hematology and Oncology

## 2011-12-27 VITALS — BP 118/57 | HR 65 | Temp 98.4°F | Resp 20

## 2011-12-27 DIAGNOSIS — D649 Anemia, unspecified: Secondary | ICD-10-CM

## 2011-12-27 LAB — BASIC METABOLIC PANEL (CC13)
BUN: 60 mg/dL — ABNORMAL HIGH (ref 7.0–26.0)
CO2: 28 mEq/L (ref 22–29)
Calcium: 10.1 mg/dL (ref 8.4–10.4)
Glucose: 156 mg/dl — ABNORMAL HIGH (ref 70–99)
Sodium: 135 mEq/L — ABNORMAL LOW (ref 136–145)

## 2011-12-27 LAB — CBC WITH DIFFERENTIAL/PLATELET
Eosinophils Absolute: 0.1 10*3/uL (ref 0.0–0.5)
LYMPH%: 13.2 % — ABNORMAL LOW (ref 14.0–49.7)
MCH: 33.6 pg (ref 25.1–34.0)
MCHC: 33.5 g/dL (ref 31.5–36.0)
MCV: 100.2 fL (ref 79.5–101.0)
MONO%: 8.1 % (ref 0.0–14.0)
Platelets: 316 10*3/uL (ref 145–400)
RBC: 1.85 10*6/uL — ABNORMAL LOW (ref 3.70–5.45)

## 2011-12-27 LAB — IRON AND TIBC
TIBC: 477 ug/dL — ABNORMAL HIGH (ref 250–470)
UIBC: 429 ug/dL — ABNORMAL HIGH (ref 125–400)

## 2011-12-27 MED ORDER — SODIUM CHLORIDE 0.9 % IJ SOLN
10.0000 mL | INTRAMUSCULAR | Status: DC | PRN
  Filled 2011-12-27: qty 10

## 2011-12-27 NOTE — Telephone Encounter (Signed)
Hgb  6.2 from labs today.  Dr. Dalene Carrow reviewed results.   Called pt at home and was informed that pt has fatigue, not able moving around much.   Pt denied SOB, just tired.   Informed pt of hemoglobin level.   Instructed pt to come back to the clinic for type and crossmatch now for 2u  PRBCs as per md's instructions.   Pt voiced understanding.

## 2011-12-28 LAB — TYPE AND SCREEN: Unit division: 0

## 2011-12-29 ENCOUNTER — Encounter: Payer: Self-pay | Admitting: Hematology and Oncology

## 2011-12-29 ENCOUNTER — Encounter: Payer: Self-pay | Admitting: Internal Medicine

## 2011-12-29 ENCOUNTER — Ambulatory Visit: Payer: Self-pay

## 2011-12-29 ENCOUNTER — Ambulatory Visit (HOSPITAL_BASED_OUTPATIENT_CLINIC_OR_DEPARTMENT_OTHER): Payer: Self-pay | Admitting: Hematology and Oncology

## 2011-12-29 ENCOUNTER — Telehealth: Payer: Self-pay | Admitting: Hematology and Oncology

## 2011-12-29 DIAGNOSIS — D509 Iron deficiency anemia, unspecified: Secondary | ICD-10-CM

## 2011-12-29 DIAGNOSIS — D539 Nutritional anemia, unspecified: Secondary | ICD-10-CM

## 2011-12-29 NOTE — Patient Instructions (Addendum)
[name]  [id number]   [facility] - AFTER VISIT SUMMARY   **RECOMMENDATIONS MADE BY THE CONSULTANT AND ANY TEST    RESULTS WILL BE SENT TO YOUR REFERRING DOCTORS.   YOUR EXAM FINDINGS, LABS AND RESULTS WERE DISCUSSED BY YOUR MD TODAY.  YOU CAN GO TO THE [facility] WEB SITE FOR INSTRUCTIONS ON HOW TO ASSESS MY CHART FOR ADDITIONAL INFORMATION AS NEEDED.  Your Updated drug allergies are: Allergies as of 12/29/2011  . (No Known Allergies)    Your current list of medications are: Current Outpatient Prescriptions  Medication Sig Dispense Refill  . acetaminophen (TYLENOL) 500 MG tablet Take 500 mg by mouth every 6 (six) hours as needed. For pain      . albuterol (PROVENTIL HFA;VENTOLIN HFA) 108 (90 BASE) MCG/ACT inhaler Inhale 2 puffs into the lungs every 6 (six) hours as needed. For shortness of breath       . Alendronate Sodium (FOSAMAX PO) Take 70 mg by mouth once a week.       . ALPRAZolam (XANAX) 0.25 MG tablet Take 0.25 mg by mouth 2 (two) times daily.       [name] amLODipine (NORVASC) 10 MG tablet Take 10 mg by mouth daily.       . calcium carbonate (OS-CAL) 600 MG TABS Take 600 mg by mouth 3 (three) times daily with meals.       . cloNIDine (CATAPRES) 0.1 MG tablet Take 0.1 mg by mouth 2 (two) times daily.      . Ferrous Sulfate (IRON) 325 (65 FE) MG TABS Take 1 tablet by mouth 3 (three) times daily.       . fexofenadine (ALLEGRA) 180 MG tablet Take 180 mg by mouth daily as needed. For allergies       . fish oil-omega-3 fatty acids 1000 MG capsule Take 1 g by mouth 3 (three) times daily. Three times daily      . fluticasone (FLONASE) 50 MCG/ACT nasal spray Place 2 sprays into the nose daily as needed. For allergies       . furosemide (LASIX) 80 MG tablet Take 0.5 tablets (40 mg total) by mouth daily.  30 tablet  0  . gemfibrozil (LOPID) 600 MG tablet Take 600 mg by mouth 2 (two) times daily before a meal.       . lisinopril (PRINIVIL,ZESTRIL) 10 MG tablet Take 4  tablets (40 mg total) by mouth daily.  30 tablet  0  . metFORMIN (GLUCOPHAGE) 1000 MG tablet Take 1,000 mg by mouth 2 (two) times daily with a meal.        . Multiple Vitamin (MULTIVITAMIN WITH MINERALS) TABS Take 1 tablet by mouth daily.      . pantoprazole (PROTONIX) 40 MG tablet Take 40 mg by mouth daily.        . potassium chloride (K-DUR) 10 MEQ tablet Take 10 mEq by mouth.        . rosuvastatin (CRESTOR) 40 MG tablet Take 40 mg by mouth every evening.       . sertraline (ZOLOFT) 100 MG tablet Take 200 mg by mouth daily.       . [DISCONTINUED] loratadine (CLARITIN) 10 MG tablet Take 10 mg by mouth daily.        . [DISCONTINUED] losartan (COZAAR) 100 MG tablet Take 100 mg by mouth daily.        . [DISCONTINUED] metoprolol tartrate (LOPRESSOR) 25 MG tablet Take 25 mg by mouth daily.  INSTRUCTIONS GIVEN AND DISCUSSED:  See attached schedule   SPECIAL INSTRUCTIONS/FOLLOW-UP:  See above.  I acknowledge that I have been informed and understand all the instructions given to me and received a copy.I know to contact the clinic, my physician, or go to the emergency Department if any problems should occur.   I do not have any more questions at this time, but understand that I may call the [facility] at [phone] during business hours should I have any further questions or need assistance in obtaining follow-up care.

## 2011-12-29 NOTE — Telephone Encounter (Signed)
appts made and pritned for pt aom °

## 2011-12-29 NOTE — Progress Notes (Signed)
This office note has been dictated.

## 2011-12-30 ENCOUNTER — Ambulatory Visit (HOSPITAL_BASED_OUTPATIENT_CLINIC_OR_DEPARTMENT_OTHER): Payer: Self-pay

## 2011-12-30 VITALS — BP 133/61 | HR 72 | Temp 97.4°F | Resp 20

## 2011-12-30 DIAGNOSIS — D509 Iron deficiency anemia, unspecified: Secondary | ICD-10-CM

## 2011-12-30 MED ORDER — SODIUM CHLORIDE 0.9 % IV SOLN
1020.0000 mg | Freq: Once | INTRAVENOUS | Status: AC
  Administered 2011-12-30: 1020 mg via INTRAVENOUS
  Filled 2011-12-30: qty 34

## 2011-12-30 NOTE — Patient Instructions (Addendum)
Ferumoxytol injection What is this medicine? FERUMOXYTOL is an iron complex. Iron is used to make healthy red blood cells, which carry oxygen and nutrients throughout the body. This medicine is used to treat iron deficiency anemia in people with chronic kidney disease. This medicine may be used for other purposes; ask your health care provider or pharmacist if you have questions. What should I tell my health care provider before I take this medicine? They need to know if you have any of these conditions: -anemia not caused by low iron levels -high levels of iron in the blood -magnetic resonance imaging (MRI) test scheduled -an unusual or allergic reaction to iron, other medicines, foods, dyes, or preservatives -pregnant or trying to get pregnant -breast-feeding How should I use this medicine? This medicine is for infusion into a vein. It is given by a health care professional in a hospital or clinic setting. Talk to your pediatrician regarding the use of this medicine in children. Special care may be needed. Overdosage: If you think you've taken too much of this medicine contact a poison control center or emergency room at once. Overdosage: If you think you have taken too much of this medicine contact a poison control center or emergency room at once. NOTE: This medicine is only for you. Do not share this medicine with others. What if I miss a dose? It is important not to miss your dose. Call your doctor or health care professional if you are unable to keep an appointment. What may interact with this medicine? This medicine may interact with the following medications: -other iron products This list may not describe all possible interactions. Give your health care provider a list of all the medicines, herbs, non-prescription drugs, or dietary supplements you use. Also tell them if you smoke, drink alcohol, or use illegal drugs. Some items may interact with your medicine. What should I watch  for while using this medicine? Visit your doctor or healthcare professional regularly. Tell your doctor or healthcare professional if your symptoms do not start to get better or if they get worse. You may need blood work done while you are taking this medicine. You may need to follow a special diet. Talk to your doctor. Foods that contain iron include: whole grains/cereals, dried fruits, beans, or peas, leafy green vegetables, and organ meats (liver, kidney). What side effects may I notice from receiving this medicine? Side effects that you should report to your doctor or health care professional as soon as possible: -allergic reactions like skin rash, itching or hives, swelling of the face, lips, or tongue -breathing problems -changes in blood pressure -feeling faint or lightheaded, falls -fever or chills -flushing, sweating, or hot feelings -swelling of the ankles or feet Side effects that usually do not require medical attention (Report these to your doctor or health care professional if they continue or are bothersome.): -diarrhea -headache -nausea, vomiting -stomach pain This list may not describe all possible side effects. Call your doctor for medical advice about side effects. You may report side effects to FDA at 1-800-FDA-1088. Where should I keep my medicine? This drug is given in a hospital or clinic and will not be stored at home. NOTE: This sheet is a summary. It may not cover all possible information. If you have questions about this medicine, talk to your doctor, pharmacist, or health care provider.  2012, [employer]vier/Gold Standard. (10/31/2007 9:48:25 PM) 

## 2011-12-30 NOTE — Progress Notes (Signed)
CC:   [name]. Hyacinth Meeker, M.D. Wilhemina Bonito. Marina Goodell, MD [facility].   IDENTIFYING STATEMENT:  The patient is a 61 year old woman with anemia who presents for followup.  INTERVAL HISTORY:  Mrs. Crew was admitted in September with symptomatic anemia.  She received packed RBC transfusion.  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 It was felt that an endoscopy was not needed at that time.  She returns today.  She had lab work performed a few days ago.  She was found to have a hemoglobin and hematocrit of 6.2 and 18.5 respectively.  She proceeded with 2 units of packed red blood cells.  She reports she feels much better today.  She has not noted any profound rectal bleeding.  She has not lost any weight.  She denies pain.  She last received Feraheme on 10/19/2011.  MEDICATIONS:  Reviewed and updated.  ALLERGIES:  None.  Past medical history, family history and social history are unchanged.  REVIEW OF SYSTEMS:  10-point review of systems negative.  Denies rectal bleeding.  PHYSICAL EXAMINATION:  General:  The patient is alert and oriented x3. Vitals:  Pulse 87, blood pressure 160/57, temperature 97, respirations 20, weight 141.9 pounds.  HEENT:  Head is atraumatic, normocephalic. Sclerae anicteric.  Mouth is moist.  Chest/CVS:  Unremarkable.  Abdomen: Soft, nontender.  Extremities:  Trace edema.  LAB DATA:  12/27/2011 white cell count 4.9, hemoglobin 6.2, hematocrit 18.5, platelets 316.  Ferritin 77, iron 48, TIBC 477, saturation 10%. Sodium 135, potassium 4.5, chloride 98, CO2 of 28, BUN 60, creatinine 1.5, glucose 156.  IMPRESSION AND PLAN:  Mrs. Dettmann is a 61 year old woman with iron- deficiency anemia.  She has a history of arteriovenous malformations She has ongoing anemia despite iron replacement and packed RBC transfusion.  I feel that she needs further GI workup, and I will refer her to see [facility].  She  received 2 units of packed red blood cells on 12/27/2011.  I will also have her proceed with Feraheme later on this week.  She has identified a new family care physician at [facility]. She follows up in 2 months' time.    ______________________________ Laurice Record, M.D. LIO/MEDQ  D:  12/29/2011  T:  12/30/2011  Job:  [id number]

## 2012-01-25 ENCOUNTER — Emergency Department (HOSPITAL_COMMUNITY): Payer: Self-pay

## 2012-01-25 ENCOUNTER — Other Ambulatory Visit: Payer: Self-pay

## 2012-01-25 ENCOUNTER — Encounter (HOSPITAL_COMMUNITY): Payer: Self-pay

## 2012-01-25 ENCOUNTER — Inpatient Hospital Stay (HOSPITAL_COMMUNITY)
Admission: EM | Admit: 2012-01-25 | Discharge: 2012-01-28 | DRG: 377 | Disposition: A | Discharge status: Home / Self Care | Payer: MEDICAID | Attending: Internal Medicine | Admitting: Internal Medicine

## 2012-01-25 ENCOUNTER — Telehealth: Payer: Self-pay | Admitting: *Deleted

## 2012-01-25 DIAGNOSIS — Z8719 Personal history of other diseases of the digestive system: Secondary | ICD-10-CM

## 2012-01-25 DIAGNOSIS — Q2730 Arteriovenous malformation, site unspecified: Secondary | ICD-10-CM

## 2012-01-25 DIAGNOSIS — D62 Acute posthemorrhagic anemia: Secondary | ICD-10-CM | POA: Diagnosis present

## 2012-01-25 DIAGNOSIS — F17200 Nicotine dependence, unspecified, uncomplicated: Secondary | ICD-10-CM

## 2012-01-25 DIAGNOSIS — I2510 Atherosclerotic heart disease of native coronary artery without angina pectoris: Secondary | ICD-10-CM

## 2012-01-25 DIAGNOSIS — K922 Gastrointestinal hemorrhage, unspecified: Secondary | ICD-10-CM | POA: Diagnosis present

## 2012-01-25 DIAGNOSIS — E669 Obesity, unspecified: Secondary | ICD-10-CM

## 2012-01-25 DIAGNOSIS — D509 Iron deficiency anemia, unspecified: Secondary | ICD-10-CM | POA: Diagnosis present

## 2012-01-25 DIAGNOSIS — I5022 Chronic systolic (congestive) heart failure: Secondary | ICD-10-CM | POA: Diagnosis present

## 2012-01-25 DIAGNOSIS — K3182 Dieulafoy lesion (hemorrhagic) of stomach and duodenum: Principal | ICD-10-CM | POA: Diagnosis present

## 2012-01-25 DIAGNOSIS — R197 Diarrhea, unspecified: Secondary | ICD-10-CM

## 2012-01-25 DIAGNOSIS — J4489 Other specified chronic obstructive pulmonary disease: Secondary | ICD-10-CM

## 2012-01-25 DIAGNOSIS — E119 Type 2 diabetes mellitus without complications: Secondary | ICD-10-CM | POA: Diagnosis present

## 2012-01-25 DIAGNOSIS — J449 Chronic obstructive pulmonary disease, unspecified: Secondary | ICD-10-CM

## 2012-01-25 DIAGNOSIS — I498 Other specified cardiac arrhythmias: Secondary | ICD-10-CM

## 2012-01-25 DIAGNOSIS — F329 Major depressive disorder, single episode, unspecified: Secondary | ICD-10-CM

## 2012-01-25 DIAGNOSIS — N390 Urinary tract infection, site not specified: Secondary | ICD-10-CM

## 2012-01-25 DIAGNOSIS — F341 Dysthymic disorder: Secondary | ICD-10-CM | POA: Diagnosis present

## 2012-01-25 DIAGNOSIS — R112 Nausea with vomiting, unspecified: Secondary | ICD-10-CM | POA: Diagnosis present

## 2012-01-25 DIAGNOSIS — Z8673 Personal history of transient ischemic attack (TIA), and cerebral infarction without residual deficits: Secondary | ICD-10-CM | POA: Diagnosis present

## 2012-01-25 DIAGNOSIS — N179 Acute kidney failure, unspecified: Secondary | ICD-10-CM

## 2012-01-25 DIAGNOSIS — K31819 Angiodysplasia of stomach and duodenum without bleeding: Secondary | ICD-10-CM

## 2012-01-25 DIAGNOSIS — D649 Anemia, unspecified: Secondary | ICD-10-CM

## 2012-01-25 DIAGNOSIS — F411 Generalized anxiety disorder: Secondary | ICD-10-CM | POA: Diagnosis present

## 2012-01-25 DIAGNOSIS — I739 Peripheral vascular disease, unspecified: Secondary | ICD-10-CM

## 2012-01-25 DIAGNOSIS — A498 Other bacterial infections of unspecified site: Secondary | ICD-10-CM | POA: Diagnosis present

## 2012-01-25 DIAGNOSIS — E860 Dehydration: Secondary | ICD-10-CM | POA: Diagnosis present

## 2012-01-25 DIAGNOSIS — F3289 Other specified depressive episodes: Secondary | ICD-10-CM

## 2012-01-25 DIAGNOSIS — D539 Nutritional anemia, unspecified: Secondary | ICD-10-CM

## 2012-01-25 DIAGNOSIS — I080 Rheumatic disorders of both mitral and aortic valves: Secondary | ICD-10-CM

## 2012-01-25 DIAGNOSIS — I509 Heart failure, unspecified: Secondary | ICD-10-CM

## 2012-01-25 DIAGNOSIS — E875 Hyperkalemia: Secondary | ICD-10-CM | POA: Diagnosis present

## 2012-01-25 DIAGNOSIS — I701 Atherosclerosis of renal artery: Secondary | ICD-10-CM

## 2012-01-25 DIAGNOSIS — E785 Hyperlipidemia, unspecified: Secondary | ICD-10-CM | POA: Diagnosis present

## 2012-01-25 DIAGNOSIS — G459 Transient cerebral ischemic attack, unspecified: Secondary | ICD-10-CM | POA: Diagnosis present

## 2012-01-25 DIAGNOSIS — I10 Essential (primary) hypertension: Secondary | ICD-10-CM

## 2012-01-25 DIAGNOSIS — K5521 Angiodysplasia of colon with hemorrhage: Secondary | ICD-10-CM | POA: Diagnosis present

## 2012-01-25 DIAGNOSIS — I679 Cerebrovascular disease, unspecified: Secondary | ICD-10-CM

## 2012-01-25 DIAGNOSIS — I5032 Chronic diastolic (congestive) heart failure: Secondary | ICD-10-CM | POA: Diagnosis present

## 2012-01-25 DIAGNOSIS — E162 Hypoglycemia, unspecified: Secondary | ICD-10-CM

## 2012-01-25 HISTORY — DX: Gastrointestinal hemorrhage, unspecified: K92.2

## 2012-01-25 LAB — D-DIMER, QUANTITATIVE: D-Dimer, Quant: 0.48 ug/mL-FEU (ref 0.00–0.48)

## 2012-01-25 LAB — CBC
Hemoglobin: 6.1 g/dL — CL (ref 12.0–15.0)
MCH: 32.3 pg (ref 26.0–34.0)
MCV: 104.2 fL — ABNORMAL HIGH (ref 78.0–100.0)
RBC: 1.89 MIL/uL — ABNORMAL LOW (ref 3.87–5.11)

## 2012-01-25 LAB — URINALYSIS, ROUTINE W REFLEX MICROSCOPIC
Glucose, UA: NEGATIVE mg/dL
pH: 5 (ref 5.0–8.0)

## 2012-01-25 LAB — URINE MICROSCOPIC-ADD ON

## 2012-01-25 LAB — OCCULT BLOOD, POC DEVICE: Fecal Occult Bld: POSITIVE

## 2012-01-25 LAB — APTT: aPTT: 111 seconds — ABNORMAL HIGH (ref 24–37)

## 2012-01-25 LAB — BASIC METABOLIC PANEL
CO2: 27 mEq/L (ref 19–32)
Calcium: 11.6 mg/dL — ABNORMAL HIGH (ref 8.4–10.5)
Creatinine, Ser: 1.69 mg/dL — ABNORMAL HIGH (ref 0.50–1.10)
Glucose, Bld: 109 mg/dL — ABNORMAL HIGH (ref 70–99)
Sodium: 133 mEq/L — ABNORMAL LOW (ref 135–145)

## 2012-01-25 LAB — PROTIME-INR
INR: 0.89 (ref 0.00–1.49)
Prothrombin Time: 12 seconds (ref 11.6–15.2)

## 2012-01-25 LAB — GLUCOSE, CAPILLARY: Glucose-Capillary: 94 mg/dL (ref 70–99)

## 2012-01-25 LAB — HEMOGLOBIN AND HEMATOCRIT, BLOOD: Hemoglobin: 7.5 g/dL — ABNORMAL LOW (ref 12.0–15.0)

## 2012-01-25 LAB — PREPARE RBC (CROSSMATCH)

## 2012-01-25 MED ORDER — CALCIUM CARBONATE 1250 (500 CA) MG PO TABS
1.0000 | ORAL_TABLET | Freq: Three times a day (TID) | ORAL | Status: DC
  Administered 2012-01-25 – 2012-01-28 (×8): 500 mg via ORAL
  Filled 2012-01-25 (×11): qty 1

## 2012-01-25 MED ORDER — INSULIN ASPART 100 UNIT/ML ~~LOC~~ SOLN: 0.0000 [IU] | Freq: Every day | SUBCUTANEOUS | Status: DC

## 2012-01-25 MED ORDER — GEMFIBROZIL 600 MG PO TABS
600.0000 mg | ORAL_TABLET | Freq: Two times a day (BID) | ORAL | Status: DC
  Administered 2012-01-25 – 2012-01-26 (×2): 600 mg via ORAL
  Filled 2012-01-25 (×4): qty 1

## 2012-01-25 MED ORDER — SERTRALINE HCL 100 MG PO TABS
200.0000 mg | ORAL_TABLET | Freq: Every day | ORAL | Status: DC
  Administered 2012-01-26 – 2012-01-28 (×3): 200 mg via ORAL
  Filled 2012-01-25 (×3): qty 2

## 2012-01-25 MED ORDER — ACETAMINOPHEN 650 MG RE SUPP: 650.0000 mg | Freq: Four times a day (QID) | RECTAL | Status: DC | PRN

## 2012-01-25 MED ORDER — ONDANSETRON HCL 4 MG/2ML IJ SOLN: 4.0000 mg | Freq: Four times a day (QID) | INTRAMUSCULAR | Status: DC | PRN

## 2012-01-25 MED ORDER — ALUM & MAG HYDROXIDE-SIMETH 200-200-20 MG/5ML PO SUSP: 30.0000 mL | Freq: Four times a day (QID) | ORAL | Status: DC | PRN

## 2012-01-25 MED ORDER — NICOTINE 21 MG/24HR TD PT24
21.0000 mg | MEDICATED_PATCH | Freq: Every day | TRANSDERMAL | Status: DC
  Administered 2012-01-25 – 2012-01-28 (×4): 21 mg via TRANSDERMAL
  Filled 2012-01-25 (×4): qty 1

## 2012-01-25 MED ORDER — SODIUM CHLORIDE 0.9 % IV SOLN
80.0000 mg | Freq: Once | INTRAVENOUS | Status: DC
  Filled 2012-01-25: qty 80

## 2012-01-25 MED ORDER — ALBUTEROL SULFATE HFA 108 (90 BASE) MCG/ACT IN AERS
2.0000 | INHALATION_SPRAY | Freq: Four times a day (QID) | RESPIRATORY_TRACT | Status: DC | PRN
  Filled 2012-01-25: qty 6.7

## 2012-01-25 MED ORDER — HYDROCODONE-ACETAMINOPHEN 5-325 MG PO TABS: 1.0000 | ORAL_TABLET | ORAL | Status: DC | PRN

## 2012-01-25 MED ORDER — HYDROMORPHONE HCL PF 1 MG/ML IJ SOLN: 1.0000 mg | INTRAMUSCULAR | Status: DC | PRN

## 2012-01-25 MED ORDER — ACETAMINOPHEN 325 MG PO TABS
650.0000 mg | ORAL_TABLET | Freq: Four times a day (QID) | ORAL | Status: DC | PRN
  Administered 2012-01-27: 650 mg via ORAL
  Filled 2012-01-25: qty 2

## 2012-01-25 MED ORDER — SODIUM CHLORIDE 0.9 % IV SOLN
INTRAVENOUS | Status: AC
  Administered 2012-01-25: 22:00:00 via INTRAVENOUS

## 2012-01-25 MED ORDER — ATORVASTATIN CALCIUM 80 MG PO TABS
80.0000 mg | ORAL_TABLET | Freq: Every day | ORAL | Status: DC
  Administered 2012-01-25 – 2012-01-27 (×3): 80 mg via ORAL
  Filled 2012-01-25 (×4): qty 1

## 2012-01-25 MED ORDER — SODIUM CHLORIDE 0.9 % IV SOLN
Freq: Once | INTRAVENOUS | Status: AC
  Administered 2012-01-25: 14:00:00 via INTRAVENOUS

## 2012-01-25 MED ORDER — ALBUTEROL SULFATE (5 MG/ML) 0.5% IN NEBU: 2.5000 mg | INHALATION_SOLUTION | RESPIRATORY_TRACT | Status: DC | PRN

## 2012-01-25 MED ORDER — SODIUM CHLORIDE 0.9 % IJ SOLN
3.0000 mL | Freq: Two times a day (BID) | INTRAMUSCULAR | Status: DC
  Administered 2012-01-25 – 2012-01-28 (×5): 3 mL via INTRAVENOUS

## 2012-01-25 MED ORDER — ALPRAZOLAM 0.25 MG PO TABS
0.2500 mg | ORAL_TABLET | Freq: Two times a day (BID) | ORAL | Status: DC
  Administered 2012-01-25 – 2012-01-28 (×6): 0.25 mg via ORAL
  Filled 2012-01-25 (×6): qty 1

## 2012-01-25 MED ORDER — FLUTICASONE PROPIONATE 50 MCG/ACT NA SUSP
2.0000 | Freq: Every day | NASAL | Status: DC | PRN
  Filled 2012-01-25: qty 16

## 2012-01-25 MED ORDER — SODIUM CHLORIDE 0.9 % IV SOLN
8.0000 mg/h | INTRAVENOUS | Status: DC
  Administered 2012-01-25 – 2012-01-26 (×3): 8 mg/h via INTRAVENOUS
  Filled 2012-01-25 (×6): qty 80

## 2012-01-25 MED ORDER — INSULIN ASPART 100 UNIT/ML ~~LOC~~ SOLN
0.0000 [IU] | Freq: Three times a day (TID) | SUBCUTANEOUS | Status: DC
  Administered 2012-01-27: 2 [IU] via SUBCUTANEOUS
  Administered 2012-01-27: 1 [IU] via SUBCUTANEOUS

## 2012-01-25 MED ORDER — ONDANSETRON HCL 4 MG PO TABS: 4.0000 mg | ORAL_TABLET | Freq: Four times a day (QID) | ORAL | Status: DC | PRN

## 2012-01-25 MED ORDER — CALCIUM CARBONATE 600 MG PO TABS
600.0000 mg | ORAL_TABLET | Freq: Three times a day (TID) | ORAL | Status: DC
  Filled 2012-01-25 (×2): qty 1

## 2012-01-25 NOTE — Telephone Encounter (Signed)
Pt's daughter [name] called stating that pt is very weak, and requested call back from nurse.   Spoke with [name], and was informed that pt is very weak,  Could hardly walk.   Pt had to hold onto  Things  With steps.   Instructed Tanya to take pt to ER now for further evaluation.   Tanya voiced understanding. Pt's  Phone    [phone].

## 2012-01-25 NOTE — Progress Notes (Signed)
  CARE MANAGEMENT ED NOTE 01/25/2012  Patient:  JEENA, ARNETT   Account Number:  [id number]  Date Initiated:  01/25/2012  Documentation initiated by:  Edd Arbour  Subjective/Objective Assessment:   61 yr old female self pay [place] resident confirms her present pcp is [facility], sami hassin vs dr Britt [facility] - cm consult for concerns with affording medication     Subjective/Objective Assessment Detail:   Pt reports receiving medication assistance "a few months ago from [place]"     Action/Plan:   ED CM reviewed discounted pharmacies, [email or web address], [facility] outpatient pharmac[facility] CM encouraged pt to have her daughter (has access to internet) assist her with [email or web address]   Action/Plan Detail:   Spoke with Dr Isidoro Donning about admission see updated orders   Anticipated DC Date:  01/28/2012     Status Recommendation to Physician:  Recommend IP Status instead of OBV Result of Recommendation:  Agreed  Other ED Services  Consult Working Plan    DC Planning Services  Other  Outpatient Services - Pt will follow up    Choice offered to / List presented to:            Status of service:  Completed, signed off  ED Comments:   ED Comments Detail:

## 2012-01-25 NOTE — ED Provider Notes (Signed)
History     CSN: [id number]  Arrival date & time 01/25/12  1046   First MD Initiated Contact with Patient 01/25/12 1154      Chief Complaint  Patient presents with  . Tachycardia    (Consider location/radiation/quality/duration/timing/severity/associated sxs/prior treatment) HPI Pt has had palpitations, lightheadedness for the past 2-3 days. States she has had anemia in the past but from unknown source. Admits to dark stool but attributes that to the Iron she is taking. No fever, chills, N,V. Mild upper abd pain. Denied NSAID use. No CP. Mild SOB with ambulation.  Past Medical History  Diagnosis Date  . Peripheral vascular disease     status post left common iliac and external iliac artery stents  . Cerebrovascular disease   . Diabetes mellitus   . COPD (chronic obstructive pulmonary disease)   . Hyperlipidemia   . Depression   . Diastolic congestive heart failure   . Hypertension   . Renal artery stenosis     status post  . CAD (coronary artery disease)     50% left main stenosis, ostial 60% LAD stenosis, 30% ramus intermediate stenosis, occluded right artery with left-to-right collaterals  . Iron deficiency anemia   . Carotid arterial disease   . Diabetic retinopathy   . 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 Carotid endarterectomy   . Cholecystectomy   . Appendectomy   . Tonsillectomy   . Cesarean section   . Shoulder arthroscopy   . Iliac artery stent     left    Family History  Problem Relation Age of Onset  . Hypertension Mother   . Heart attack Mother   . Hypertension Father   . Angina Father   . Cancer Sister   . Cancer Brother     History  Substance Use Topics  . Smoking status: Current Some Day Smoker -- 0.2 packs/day for 36 years    Types: Cigarettes  . Smokeless tobacco: Never Used  . Alcohol Use: No    OB History    Grav Para Term Preterm Abortions TAB  SAB Ect Mult Living                  Review of Systems  Constitutional: Positive for fatigue. Negative for fever and chills.  HENT: Negative for neck pain.   Respiratory: Positive for shortness of breath. Negative for cough and wheezing.   Cardiovascular: Positive for leg swelling. Negative for chest pain and palpitations.  Gastrointestinal: Positive for abdominal pain. Negative for nausea, vomiting, diarrhea and constipation.  Genitourinary: Negative for dysuria and hematuria.  Musculoskeletal: Negative for back pain.  Skin: Negative for rash and wound.  Neurological: Positive for dizziness and light-headedness. Negative for syncope, weakness, numbness and headaches.    Allergies  Review of patient's allergies indicates no known allergies.  Home Medications   Current Outpatient Rx  Name  Route  Sig  Dispense  Refill  . ACETAMINOPHEN 500 MG PO TABS   Oral   Take 500 mg by mouth every 6 (six) hours as needed. For pain         . ALBUTEROL SULFATE HFA 108 (90 BASE) MCG/ACT IN AERS   Inhalation   Inhale 2 puffs into the lungs every 6 (six) hours as needed. For shortness of breath          . FOSAMAX PO   Oral   Take 70  mg by mouth once a week.          . ALPRAZOLAM 0.25 MG PO TABS   Oral   Take 0.25 mg by mouth 2 (two) times daily.          [name] AMLODIPINE BESYLATE 10 MG PO TABS   Oral   Take 10 mg by mouth daily.          [name] CALCIUM CARBONATE 600 MG PO TABS   Oral   Take 600 mg by mouth 3 (three) times daily with meals.          [name] CLONIDINE HCL 0.1 MG PO TABS   Oral   Take 0.1 mg by mouth 2 (two) times daily.         . IRON 325 (65 FE) MG PO TABS   Oral   Take 1 tablet by mouth 3 (three) times daily.          [name] FEXOFENADINE HCL 180 MG PO TABS   Oral   Take 180 mg by mouth daily as needed. For allergies          . OMEGA-3 FATTY ACIDS 1000 MG PO CAPS   Oral   Take 1 g by mouth 3 (three) times daily. Three times daily         . FLUTICASONE  PROPIONATE 50 MCG/ACT NA SUSP   Nasal   Place 2 sprays into the nose daily as needed. For allergies          . FUROSEMIDE 80 MG PO TABS   Oral   Take 0.5 tablets (40 mg total) by mouth daily.   30 tablet   0   . GEMFIBROZIL 600 MG PO TABS   Oral   Take 600 mg by mouth 2 (two) times daily before a meal.          . LISINOPRIL 10 MG PO TABS   Oral   Take 4 tablets (40 mg total) by mouth daily.   30 tablet   0   . METFORMIN HCL 1000 MG PO TABS   Oral   Take 1,000 mg by mouth 2 (two) times daily with a meal.           . ADULT MULTIVITAMIN W/MINERALS CH   Oral   Take 1 tablet by mouth daily.         [name] PANTOPRAZOLE SODIUM 40 MG PO TBEC   Oral   Take 40 mg by mouth daily.           [name] POTASSIUM CHLORIDE ER 10 MEQ PO TBCR   Oral   Take 10 mEq by mouth.           . ROSUVASTATIN CALCIUM 40 MG PO TABS   Oral   Take 40 mg by mouth every evening.          [name] SERTRALINE HCL 100 MG PO TABS   Oral   Take 200 mg by mouth daily.            BP 105/52  Pulse 63  Temp 98.3 F (36.8 C) (Oral)  Resp 13  SpO2 100%  Physical Exam  Nursing note and vitals reviewed. Constitutional: She is oriented to person, place, and time. She appears well-developed and well-nourished. No distress.  HENT:  Head: Normocephalic and atraumatic.  Mouth/Throat: Oropharynx is clear and moist.  Eyes: EOM are normal. Pupils are equal, round, and reactive to light.  Neck: Normal range of motion. Neck supple.  Cardiovascular: Normal rate and regular rhythm.   Pulmonary/Chest: Effort normal and breath sounds normal. No respiratory distress. She has no wheezes. She has no rales. She exhibits no tenderness.  Abdominal: Soft. Bowel sounds are normal. She exhibits no distension and no mass. There is tenderness (mild epigastic TTP. No rebound or guarding). There is no rebound and no guarding.  Musculoskeletal: Normal range of motion. She exhibits edema (1+ bl pitting edema). She exhibits no  tenderness.  Neurological: She is alert and oriented to person, place, and time.       5/5 motor in all ext. Sensation intact  Skin: Skin is warm and dry. No rash noted. No erythema.  Psychiatric: She has a normal mood and affect. Her behavior is normal.    ED Course  Procedures (including critical care time)  Labs Reviewed  CBC - Abnormal; Notable for the following:    RBC 1.89 (*)     Hemoglobin 6.1 (*)     HCT 19.7 (*)     MCV 104.2 (*)     RDW 15.6 (*)     All other components within normal limits  BASIC METABOLIC PANEL - Abnormal; Notable for the following:    Sodium 133 (*)     Potassium 5.6 (*)     Chloride 95 (*)     Glucose, Bld 109 (*)     BUN 70 (*)     Creatinine, Ser 1.69 (*)     Calcium 11.6 (*)     GFR calc non Af Amer 32 (*)     GFR calc Af Amer 37 (*)     All other components within normal limits  URINALYSIS, ROUTINE W REFLEX MICROSCOPIC - Abnormal; Notable for the following:    Hgb urine dipstick TRACE (*)     Leukocytes, UA SMALL (*)     All other components within normal limits  GLUCOSE, CAPILLARY - Abnormal; Notable for the following:    Glucose-Capillary 106 (*)     All other components within normal limits  URINE MICROSCOPIC-ADD ON - Abnormal; Notable for the following:    Bacteria, UA FEW (*)     Casts HYALINE CASTS (*)     All other components within normal limits  POCT I-STAT TROPONIN I  TYPE AND SCREEN  PREPARE RBC (CROSSMATCH)  OCCULT BLOOD, POC DEVICE  PROTIME-INR  APTT  D-DIMER, QUANTITATIVE  OCCULT BLOOD X 1 CARD TO LAB, STOOL  URINE CULTURE   Dg Chest 2 View  01/25/2012  *RADIOLOGY REPORT*  Clinical Data: Shortness of breath, weakness, hypertension, diabetes, smoker, COPD, coronary artery disease  CHEST - 2 VIEW  Comparison: 11/03/2011  Findings: Enlargement of cardiac silhouette with pulmonary vascular congestion. Peribronchial thickening. No acute infiltrate, pleural effusion or pneumothorax. Atherosclerotic calcification aortic  arch. Bones unremarkable. Surgical clips right upper quadrant.  IMPRESSION: Enlargement of cardiac silhouette. Mild chronic bronchitic changes.   Original Report Authenticated By: Ulyses Southward, M.D.      1. GI bleed   2. Anemia       Date: 01/25/2012  Rate: 72  Rhythm: normal sinus rhythm  QRS Axis: normal  Intervals: normal  ST/T Wave abnormalities: nonspecific T wave changes  Conduction Disutrbances:nonspecific intraventricular conduction delay  Narrative Interpretation:   Old EKG Reviewed: changes noted   MDM    Pt remains HD stable. Discussed with [facility] who will admit.       Loren Racer, MD 01/25/12 [phone]

## 2012-01-25 NOTE — Consult Note (Signed)
Referring Provider: No ref. provider found Primary Care Physician:  Quitman Livings, MD Primary Gastroenterologist:  Dr. Marina Goodell  Reason for Consultation:  IDA; heme positive stool  HPI: [name] is a 61 y.o. female with history of chronic anemia, history of CVA, diabetes, COPD, hypertension, CAD on iron replacement therapy presented to ED with dizziness, lightheadedness, weakness, and palpitation for last 2-3 days. Patient reports that she has had anemia in the past however no specific etiology was found. She also admits to having dark stools in the last couple of months, but thought that it was from the iron that she is taking.  She has been on the iron for several years, however.  She also reported some mild nausea, decreased appetite, mild abdominal pain. In ED, patient was found to have hemoglobin of 6.1, and stool Hemoccult was positive.  Also, patient reports that she takes 1/2 of aspirin 81 mg daily, has been taking Tylenol off and on for pain (not ibuprofen).  She had been on a once daily PPI, but stopped that a couple of weeks ago and started taking over-the-counter Zantac because it was cheaper.   Just of note, she was transfused a few weeks ago as an outpatient for a Hgb of 6.2 grams at that time as well.  Was supposed to see Dr. Marina Goodell back as an outpatient at an appointment on Friday, 12/6.  No vomiting, constipation, diarrhea, bloody stool.  No significant weight loss.  Colonoscopy 09/2006 showed diverticulosis and very tiny polyps-repeat recommended in 10 years.  EGD 09/2004 was normal-recommended to continue iron TID indefinitely with close monitoring of Hgb.  WCE in 09/2003 showed some blood spots/erosions in small bowel.  Enteroscopy 09/2003 performed for anemia and abnormal capsule was normal.   Past Medical History  Diagnosis Date  . Peripheral vascular disease     status post left common iliac and external iliac artery stents  . Cerebrovascular disease   . Diabetes mellitus   .  COPD (chronic obstructive pulmonary disease)   . Hyperlipidemia   . Depression   . Diastolic congestive heart failure   . Hypertension   . Renal artery stenosis     status post  . CAD (coronary artery disease)     50% left main stenosis, ostial 60% LAD stenosis, 30% ramus intermediate stenosis, occluded right artery with left-to-right collaterals  . Iron deficiency anemia   . Carotid arterial disease   . Diabetic retinopathy   . 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 Carotid endarterectomy   . Cholecystectomy   . Appendectomy   . Tonsillectomy   . Cesarean section   . Shoulder arthroscopy   . Iliac artery stent     left    Prior to Admission medications   Medication Sig Start Date End Date Taking? Authorizing Provider  acetaminophen (TYLENOL) 500 MG tablet Take 500 mg by mouth every 6 (six) hours as needed. For pain   Yes Historical Provider, MD  albuterol (PROVENTIL HFA;VENTOLIN HFA) 108 (90 BASE) MCG/ACT inhaler Inhale 2 puffs into the lungs every 6 (six) hours as needed. For shortness of breath    Yes Historical Provider, MD  Alendronate Sodium (FOSAMAX PO) Take 70 mg by mouth once a week.    Yes Historical Provider, MD  ALPRAZolam (XANAX) 0.25 MG tablet Take 0.25 mg by mouth 2 (two) times daily.    Yes Historical Provider, MD  amLODipine (  NORVASC) 10 MG tablet Take 10 mg by mouth daily.    Yes Historical Provider, MD  calcium carbonate (OS-CAL) 600 MG TABS Take 600 mg by mouth 3 (three) times daily with meals.    Yes Historical Provider, MD  cloNIDine (CATAPRES) 0.1 MG tablet Take 0.1 mg by mouth 2 (two) times daily.   Yes Historical Provider, MD  Ferrous Sulfate (IRON) 325 (65 FE) MG TABS Take 1 tablet by mouth 3 (three) times daily.    Yes Historical Provider, MD  fexofenadine (ALLEGRA) 180 MG tablet Take 180 mg by mouth daily as needed. For allergies    Yes Historical Provider, MD  fish  oil-omega-3 fatty acids 1000 MG capsule Take 1 g by mouth 3 (three) times daily. Three times daily   Yes Historical Provider, MD  fluticasone (FLONASE) 50 MCG/ACT nasal spray Place 2 sprays into the nose daily as needed. For allergies    Yes Historical Provider, MD  furosemide (LASIX) 80 MG tablet Take 0.5 tablets (40 mg total) by mouth daily. 11/05/11  Yes Richarda Overlie, MD  gemfibrozil (LOPID) 600 MG tablet Take 600 mg by mouth 2 (two) times daily before a meal.    Yes Historical Provider, MD  lisinopril (PRINIVIL,ZESTRIL) 10 MG tablet Take 4 tablets (40 mg total) by mouth daily. 11/05/11  Yes Richarda Overlie, MD  metFORMIN (GLUCOPHAGE) 1000 MG tablet Take 1,000 mg by mouth 2 (two) times daily with a meal.     Yes Historical Provider, MD  Multiple Vitamin (MULTIVITAMIN WITH MINERALS) TABS Take 1 tablet by mouth daily.   Yes Historical Provider, MD  pantoprazole (PROTONIX) 40 MG tablet Take 40 mg by mouth daily.     Yes Historical Provider, MD  potassium chloride (K-DUR) 10 MEQ tablet Take 10 mEq by mouth.     Yes Historical Provider, MD  rosuvastatin (CRESTOR) 40 MG tablet Take 40 mg by mouth every evening.    Yes Historical Provider, MD  sertraline (ZOLOFT) 100 MG tablet Take 200 mg by mouth daily.    Yes Historical Provider, MD    Current Facility-Administered Medications  Medication Dose Route Frequency Provider Last Rate Last Dose  . [COMPLETED] 0.9 %  sodium chloride infusion   Intravenous Once Loren Racer, MD 20 mL/hr at 01/25/12 1412    . pantoprazole (PROTONIX) 80 mg in sodium chloride 0.9 % 100 mL IVPB  80 mg Intravenous Once Loren Racer, MD      . pantoprazole (PROTONIX) 80 mg in sodium chloride 0.9 % 250 mL infusion  8 mg/hr Intravenous Continuous Loren Racer, MD       Current Outpatient Prescriptions  Medication Sig Dispense Refill  . acetaminophen (TYLENOL) 500 MG tablet Take 500 mg by mouth every 6 (six) hours as needed. For pain      . albuterol (PROVENTIL HFA;VENTOLIN  HFA) 108 (90 BASE) MCG/ACT inhaler Inhale 2 puffs into the lungs every 6 (six) hours as needed. For shortness of breath       . Alendronate Sodium (FOSAMAX PO) Take 70 mg by mouth once a week.       . ALPRAZolam (XANAX) 0.25 MG tablet Take 0.25 mg by mouth 2 (two) times daily.       [name] amLODipine (NORVASC) 10 MG tablet Take 10 mg by mouth daily.       . calcium carbonate (OS-CAL) 600 MG TABS Take 600 mg by mouth 3 (three) times daily with meals.       . cloNIDine (CATAPRES) 0.1 MG  tablet Take 0.1 mg by mouth 2 (two) times daily.      . Ferrous Sulfate (IRON) 325 (65 FE) MG TABS Take 1 tablet by mouth 3 (three) times daily.       . fexofenadine (ALLEGRA) 180 MG tablet Take 180 mg by mouth daily as needed. For allergies       . fish oil-omega-3 fatty acids 1000 MG capsule Take 1 g by mouth 3 (three) times daily. Three times daily      . fluticasone (FLONASE) 50 MCG/ACT nasal spray Place 2 sprays into the nose daily as needed. For allergies       . furosemide (LASIX) 80 MG tablet Take 0.5 tablets (40 mg total) by mouth daily.  30 tablet  0  . gemfibrozil (LOPID) 600 MG tablet Take 600 mg by mouth 2 (two) times daily before a meal.       . lisinopril (PRINIVIL,ZESTRIL) 10 MG tablet Take 4 tablets (40 mg total) by mouth daily.  30 tablet  0  . metFORMIN (GLUCOPHAGE) 1000 MG tablet Take 1,000 mg by mouth 2 (two) times daily with a meal.        . Multiple Vitamin (MULTIVITAMIN WITH MINERALS) TABS Take 1 tablet by mouth daily.      . pantoprazole (PROTONIX) 40 MG tablet Take 40 mg by mouth daily.        . potassium chloride (K-DUR) 10 MEQ tablet Take 10 mEq by mouth.        . rosuvastatin (CRESTOR) 40 MG tablet Take 40 mg by mouth every evening.       . sertraline (ZOLOFT) 100 MG tablet Take 200 mg by mouth daily.       . [DISCONTINUED] loratadine (CLARITIN) 10 MG tablet Take 10 mg by mouth daily.        . [DISCONTINUED] losartan (COZAAR) 100 MG tablet Take 100 mg by mouth daily.        . [DISCONTINUED]  metoprolol tartrate (LOPRESSOR) 25 MG tablet Take 25 mg by mouth daily.         Allergies as of 01/25/2012  . (No Known Allergies)    Family History  Problem Relation Age of Onset  . Hypertension Mother   . Heart attack Mother   . Hypertension Father   . Angina Father   . Cancer Sister   . Cancer Brother     History   Social History  . Marital Status: Married    Spouse Name: N/A    Number of Children: N/A  . Years of Education: N/A   Occupational History  . Not on file.   Social History Main Topics  . Smoking status: Current Some Day Smoker -- 0.2 packs/day for 36 years    Types: Cigarettes  . Smokeless tobacco: Never Used  . Alcohol Use: No  . Drug Use: No  . Sexually Active: No   Other Topics Concern  . Not on file   Social History Narrative  . No narrative on file    Review of Systems: Ten point ROS is O/W negative except as mentioned in HPI.  Physical Exam: Vital signs in last 24 hours: Temp:  [98.3 F (36.8 C)-98.6 F (37 C)] 98.6 F (37 C) (12/04 1450) Pulse Rate:  [62-72] 62  (12/04 1533) Resp:  [10-17] 17  (12/04 1533) BP: (105-122)/(41-53) 122/49 mmHg (12/04 1533) SpO2:  [90 %-100 %] 100 % (12/04 1533)   General:   Alert, Well-developed, well-nourished, pleasant and cooperative in NAD.  Head:  Normocephalic and atraumatic. Eyes:  Sclera clear, no icterus.  Conjunctiva pale. Ears:  Normal auditory acuity. Mouth:  No deformity or lesions.  Poor dentition. Lungs:  Clear throughout to auscultation.  No wheezes, crackles, or rhonchi.  Heart:  Regular rate and rhythm; no murmurs, clicks, rubs,  or gallops. Abdomen:  Soft, non-distended, BS active, mild mid-abdominal TTP without R/RG, nonpalp mass or hsm.  RUQ cholecystectomy scar noted as well as low vertical cesarean scar.   Rectal:  Deferred.  Heme positive in the ED.  Msk:  Symmetrical without gross deformities. Pulses:  Normal pulses noted. Extremities:  Without clubbing or edema. Neurologic:   Alert and  oriented x4;  grossly normal neurologically. Skin:  Intact without significant lesions or rashes Psych:  Alert and cooperative. Normal mood and affect.  Intake/Output this shift: Total I/O In: 1347.5 [I.V.:1000; Blood:347.5] Out: -   Lab Results:  Basename 01/25/12 1215  WBC 7.3  HGB 6.1*  HCT 19.7*  PLT 329   BMET  Basename 01/25/12 1215  NA 133*  K 5.6*  CL 95*  CO2 27  GLUCOSE 109*  BUN 70*  CREATININE 1.69*  CALCIUM 11.6*    Studies/Results: Dg Chest 2 View  01/25/2012  *RADIOLOGY REPORT*  Clinical Data: Shortness of breath, weakness, hypertension, diabetes, smoker, COPD, coronary artery disease  CHEST - 2 VIEW  Comparison: 11/03/2011  Findings: Enlargement of cardiac silhouette with pulmonary vascular congestion. Peribronchial thickening. No acute infiltrate, pleural effusion or pneumothorax. Atherosclerotic calcification aortic arch. Bones unremarkable. Surgical clips right upper quadrant.  IMPRESSION: Enlargement of cardiac silhouette. Mild chronic bronchitic changes.   Original Report Authenticated By: Ulyses Southward, M.D.     IMPRESSION:  -Chronic IDA on chronic iron therapy and receiving blood transfusions and iron infusions as an outpatient under Dr. Lonell Face care.  This has been an issue since at least 2005, but is obviously not responding to this therapy recently so suspect that something more acute has been occurring. -Heme positive stool:  Only findings on extensive GI evaluation were small erosions/blood spots in the small bowel on WCE, but subsequent enteroscopy was negative in 2005.  PLAN: -Agree with blood transfusion.  Continue to monitor Hgb. -Will plan for EGD/push enteroscopy 12/5.   ZEHR, JESSICA D.  01/25/2012, 3:45 PM  Pager number [phone]  [facility] Attending  I have also seen and assessed the patient and agree with the above note. Seems like anemia worsening despite chronic therapy over years - so ? If something different going  on. Will start endoscopic reassessment - starting with EGD/enteroscopy. Presumably does not have sprue but will keep in mind.  The risks and benefits as well as alternatives of endoscopic procedure(s) have been discussed and reviewed. All questions answered. The patient agrees to proceed.  Iva Boop, MD, [facility] [phone] (pager) 01/25/2012 6:23 PM

## 2012-01-25 NOTE — ED Notes (Signed)
Report given to donna, rn on floor

## 2012-01-25 NOTE — H&P (Signed)
History and Physical       Hospital Admission Note Date: 01/25/2012  Patient name: [name] Medical record number: [id number] Date of birth: 23-Nov-1950 Age: 61 y.o. Gender: female PCP: Quitman Livings, MD    Chief Complaint:  Dizziness with mild shortness of breath in the last 3 days  HPI: Patient is a 61 year old female with history of chronic anemia, history of CVA, diabetes, COPD, hypertension, CAD on iron replacement therapy presented to ED with dizziness, lightheadedness and palpitation for last 2-3 days. Patient reports that she has had anemia in the past however no specific etiology was found. She also admits to having dark stools in the last 3 days however she attributed that to the iron she was taking. She also reported nausea, poor appetite, mild epigastric abdominal pain with mild dyspnea on ambulation and worsening in the last 3 days. In ED, patient was found to have hemoglobin of 6.1, baseline around 10, stool Hemoccult was positive. Also, patient reports that she takes aspirin 81 mg daily, has been taking ibuprofen off and on for pain, she stopped taking Prilosec 2 weeks ago. She started taking over-the-counter Zantac because it was cheaper.    Review of Systems:  Constitutional: Denies fever, chills, diaphoresis, + appetite change and fatigue.  HEENT: Denies photophobia, eye pain, redness, hearing loss, ear pain, congestion, sore throat, rhinorrhea, sneezing, mouth sores, trouble swallowing, neck pain, neck stiffness and tinnitus.   Respiratory: Patient reports dyspnea on ambulation but no chest tightness or wheezing.   Cardiovascular: Denies chest pain, and leg swelling.  Gastrointestinal: Denies vomiting, diarrhea, constipation. Please see history of present illness. Genitourinary: Denies dysuria, urgency, frequency, hematuria, flank pain and difficulty urinating.  Musculoskeletal: Denies myalgias, back pain, joint  swelling, arthralgias and gait problem.  Skin: Denies rash and wound.  Neurological: Denies dizziness, seizures, syncope, weakness, light-headedness, numbness and headaches.  Hematological: Denies adenopathy. Easy bruising, personal or family bleeding history  Psychiatric/Behavioral: Denies suicidal ideation, mood changes, confusion, nervousness, sleep disturbance and agitation  Past Medical History: Past Medical History  Diagnosis Date  . Peripheral vascular disease     status post left common iliac and external iliac artery stents  . Cerebrovascular disease   . Diabetes mellitus   . COPD (chronic obstructive pulmonary disease)   . Hyperlipidemia   . Depression   . Diastolic congestive heart failure   . Hypertension   . Renal artery stenosis     status post  . CAD (coronary artery disease)     50% left main stenosis, ostial 60% LAD stenosis, 30% ramus intermediate stenosis, occluded right artery with left-to-right collaterals  . Iron deficiency anemia   . Carotid arterial disease   . Diabetic retinopathy   . GI AVM (gastrointestinal arteriovenous vascular malformation)     small bowel   Past Surgical History  Procedure Date  . Carotid endarterectomy   . Cholecystectomy   . Appendectomy   . Tonsillectomy   . Cesarean section   . Shoulder arthroscopy   . Iliac artery stent     left    Medications: Prior to Admission medications   Medication Sig Start Date End Date Taking? Authorizing Provider  acetaminophen (TYLENOL) 500 MG tablet Take 500 mg by mouth every 6 (six) hours as needed. For pain   Yes Historical Provider, MD  albuterol (PROVENTIL HFA;VENTOLIN HFA) 108 (90 BASE) MCG/ACT inhaler Inhale 2 puffs into the lungs every 6 (six) hours as needed. For shortness of breath    Yes Historical Provider,  MD  Alendronate Sodium (FOSAMAX PO) Take 70 mg by mouth once a week.    Yes Historical Provider, MD  ALPRAZolam (XANAX) 0.25 MG tablet Take 0.25 mg by mouth 2 (two) times  daily.    Yes Historical Provider, MD  amLODipine (NORVASC) 10 MG tablet Take 10 mg by mouth daily.    Yes Historical Provider, MD  calcium carbonate (OS-CAL) 600 MG TABS Take 600 mg by mouth 3 (three) times daily with meals.    Yes Historical Provider, MD  cloNIDine (CATAPRES) 0.1 MG tablet Take 0.1 mg by mouth 2 (two) times daily.   Yes Historical Provider, MD  Ferrous Sulfate (IRON) 325 (65 FE) MG TABS Take 1 tablet by mouth 3 (three) times daily.    Yes Historical Provider, MD  fexofenadine (ALLEGRA) 180 MG tablet Take 180 mg by mouth daily as needed. For allergies    Yes Historical Provider, MD  fish oil-omega-3 fatty acids 1000 MG capsule Take 1 g by mouth 3 (three) times daily. Three times daily   Yes Historical Provider, MD  fluticasone (FLONASE) 50 MCG/ACT nasal spray Place 2 sprays into the nose daily as needed. For allergies    Yes Historical Provider, MD  furosemide (LASIX) 80 MG tablet Take 0.5 tablets (40 mg total) by mouth daily. 11/05/11  Yes Richarda Overlie, MD  gemfibrozil (LOPID) 600 MG tablet Take 600 mg by mouth 2 (two) times daily before a meal.    Yes Historical Provider, MD  lisinopril (PRINIVIL,ZESTRIL) 10 MG tablet Take 4 tablets (40 mg total) by mouth daily. 11/05/11  Yes Richarda Overlie, MD  metFORMIN (GLUCOPHAGE) 1000 MG tablet Take 1,000 mg by mouth 2 (two) times daily with a meal.     Yes Historical Provider, MD  Multiple Vitamin (MULTIVITAMIN WITH MINERALS) TABS Take 1 tablet by mouth daily.   Yes Historical Provider, MD  pantoprazole (PROTONIX) 40 MG tablet Take 40 mg by mouth daily.     Yes Historical Provider, MD  potassium chloride (K-DUR) 10 MEQ tablet Take 10 mEq by mouth.     Yes Historical Provider, MD  rosuvastatin (CRESTOR) 40 MG tablet Take 40 mg by mouth every evening.    Yes Historical Provider, MD  sertraline (ZOLOFT) 100 MG tablet Take 200 mg by mouth daily.    Yes Historical Provider, MD    Allergies:  No Known Allergies  Social History:  reports that  she has been smoking Cigarettes.  She has a 9 pack-year smoking history. She smokes about half pack per day .She has never used smokeless tobacco. She reports that she does not drink alcohol or use illicit drugs.  Family History: Family History  Problem Relation Age of Onset  . Hypertension Mother   . Heart attack Mother   . Hypertension Father   . Angina Father   . Cancer Sister   . Cancer Brother     Physical Exam: Blood pressure 122/49, pulse 63, temperature 98.6 F (37 C), temperature source Oral, resp. rate 16, SpO2 96.00%. General: Alert, awake, oriented x3, in no acute distress. HEENT: normocephalic, atraumatic, anicteric sclera, pale conjunctiva, pupils equal and reactive to light and accomodation, oropharynx clear, dry mucosal membranes Neck: supple, no masses or lymphadenopathy, no goiter, no bruits  Heart: Regular rate and rhythm, without murmurs, rubs or gallops. Lungs: Clear to auscultation bilaterally, no wheezing, rales or rhonchi. Abdomen: Soft, mild epigastric TTP, nondistended, positive bowel sounds, no masses. Extremities: No clubbing, cyanosis or edema with positive pedal pulses. Neuro: Grossly  intact, no focal neurological deficits, strength 5/5 upper and lower extremities bilaterally Psych: alert and oriented x 3, normal mood and affect Skin: no rashes or lesions, warm and dry   LABS on Admission:  Basic Metabolic Panel:  Lab 01/25/12 4540  NA 133*  K 5.6*  CL 95*  CO2 27  GLUCOSE 109*  BUN 70*  CREATININE 1.69*  CALCIUM 11.6*  MG --  PHOS --  CBC:  Lab 01/25/12 1215  WBC 7.3  NEUTROABS --  HGB 6.1*  HCT 19.7*  MCV 104.2*  PLT 329   Cardiac Enzymes: No results found for this basename: CKTOTAL:2,CKMB:2,CKMBINDEX:2,TROPONINI:2 in the last 168 hours BNP: No components found with this basename: POCBNP:2 CBG:  Lab 01/25/12 1209  GLUCAP 106*     Radiological Exams on Admission: Dg Chest 2 View  01/25/2012  *RADIOLOGY REPORT*  Clinical  Data: Shortness of breath, weakness, hypertension, diabetes, smoker, COPD, coronary artery disease  CHEST - 2 VIEW  Comparison: 11/03/2011  Findings: Enlargement of cardiac silhouette with pulmonary vascular congestion. Peribronchial thickening. No acute infiltrate, pleural effusion or pneumothorax. Atherosclerotic calcification aortic arch. Bones unremarkable. Surgical clips right upper quadrant.  IMPRESSION: Enlargement of cardiac silhouette. Mild chronic bronchitic changes.   Original Report Authenticated By: Ulyses Southward, M.D.     Assessment/Plan Principal Problem:  *Acute blood loss anemia with Upper GI bleed: Hemoglobin 6.1, baseline around 10. Primary gastroenterologist, Dr. Marina Goodell - Hemodynamically stable, admit to telemetry, transfuse 2 units of packed RBCs - Placed on PPI drip, hold aspirin - GI consulted, place on clear liquid diet, NPO after MN for possible endoscopy tomorrow - Patient was counseled to stop taking NSAIDs. Hold Fosamax.   Active Problems:  AKI (acute kidney injury): Likely secondary to GI bleed, dehydration - Placed on gentle hydration, hold Lasix, lisinopril, metformin   DYSLIPIDEMIA: Continue statin   ANXIETY DEPRESSION: Continue Zoloft   CAD: Currently no chest pain or any symptoms - Hold aspirin, hold all antihypertensives for now   CHF: Currently compensated, avoid fluid overload   History of TIA/  CEREBROVASCULAR DISEASE: - Currently no new focal neurological deficits, hold aspirin due to GI bleed   COPD: Stable, patient counseled on nicotine cessation - Continue albuterol nebs PRN, nicotine patch   Hypertension: Currently borderline - Hold all antihypertensives   Diabetes mellitus: - Obtain hemoglobin A1c, place on sliding scale insulin   Hyperkalemia: Likely due to potassium replacement, hold KDUR  DVT prophylaxis: SCD's  CODE STATUS: FC  Further plan will depend as patient's clinical course evolves and further radiologic and laboratory data  become available.   Time Spent on Admission: 1 hour  RAI,RIPUDEEP M.D. [facility] 01/25/2012, 3:53 PM Pager: [phone]  If 7PM-7AM, please contact night-coverage [email or web address] Password [email or web address]

## 2012-01-25 NOTE — ED Notes (Signed)
Attempted to call report. Bed assignment changed. Will call back in 15 min

## 2012-01-25 NOTE — ED Notes (Signed)
Pt alert and oriented x4. Respirations even and unlabored, bilateral symmetrical rise and fall of chest. Skin warm and dry. In no acute distress. Denies needs.   

## 2012-01-25 NOTE — Progress Notes (Signed)
This nurse called Dr. Isidoro Donning to clarify the order for blood transfusion, she said to give 2 units,2nd unit infusing at this time, will endorsed accordingly. Hulda Marin RN

## 2012-01-25 NOTE — ED Notes (Addendum)
Patient reports that she began having a "racing heart" x 2 days and has gotten progressively worse, especially with activity. Patient also c/o dizziness.

## 2012-01-26 ENCOUNTER — Encounter (HOSPITAL_COMMUNITY): Payer: Self-pay | Admitting: *Deleted

## 2012-01-26 ENCOUNTER — Encounter (HOSPITAL_COMMUNITY)
Admission: EM | Disposition: A | Discharge status: Home / Self Care | Payer: Self-pay | Source: Home / Self Care | Attending: Internal Medicine

## 2012-01-26 DIAGNOSIS — K922 Gastrointestinal hemorrhage, unspecified: Secondary | ICD-10-CM

## 2012-01-26 DIAGNOSIS — K31819 Angiodysplasia of stomach and duodenum without bleeding: Secondary | ICD-10-CM

## 2012-01-26 DIAGNOSIS — E119 Type 2 diabetes mellitus without complications: Secondary | ICD-10-CM

## 2012-01-26 DIAGNOSIS — R112 Nausea with vomiting, unspecified: Secondary | ICD-10-CM | POA: Diagnosis present

## 2012-01-26 HISTORY — PX: ENTEROSCOPY: SHX5533

## 2012-01-26 LAB — CBC
MCH: 31 pg (ref 26.0–34.0)
MCHC: 32.4 g/dL (ref 30.0–36.0)
MCV: 96 fL (ref 78.0–100.0)
Platelets: 263 10*3/uL (ref 150–400)
RDW: 19.4 % — ABNORMAL HIGH (ref 11.5–15.5)

## 2012-01-26 LAB — BASIC METABOLIC PANEL
BUN: 56 mg/dL — ABNORMAL HIGH (ref 6–23)
CO2: 30 mEq/L (ref 19–32)
Calcium: 9.7 mg/dL (ref 8.4–10.5)
Creatinine, Ser: 1.36 mg/dL — ABNORMAL HIGH (ref 0.50–1.10)
GFR calc non Af Amer: 41 mL/min — ABNORMAL LOW (ref >90)
Glucose, Bld: 95 mg/dL (ref 70–99)
Sodium: 141 mEq/L (ref 135–145)

## 2012-01-26 LAB — GLUCOSE, CAPILLARY
Glucose-Capillary: 95 mg/dL (ref 70–99)
Glucose-Capillary: 103 mg/dL — ABNORMAL HIGH (ref 70–99)
Glucose-Capillary: 70 mg/dL (ref 70–99)

## 2012-01-26 LAB — HEMOGLOBIN AND HEMATOCRIT, BLOOD
HCT: 24.1 % — ABNORMAL LOW (ref 36.0–46.0)
Hemoglobin: 7.9 g/dL — ABNORMAL LOW (ref 12.0–15.0)
Hemoglobin: 7.5 g/dL — ABNORMAL LOW (ref 12.0–15.0)

## 2012-01-26 LAB — HEMOGLOBIN A1C: Hgb A1c MFr Bld: 4.4 % (ref <5.7)

## 2012-01-26 SURGERY — ENTEROSCOPY
Anesthesia: Moderate Sedation

## 2012-01-26 MED ORDER — SODIUM CHLORIDE 0.9 % IJ SOLN
INTRAMUSCULAR | Status: DC | PRN
  Administered 2012-01-26: 17:00:00

## 2012-01-26 MED ORDER — MIDAZOLAM HCL 10 MG/2ML IJ SOLN
INTRAMUSCULAR | Status: DC | PRN
  Administered 2012-01-26 (×6): 1 mg via INTRAVENOUS
  Administered 2012-01-26: 2 mg via INTRAVENOUS

## 2012-01-26 MED ORDER — BUTAMBEN-TETRACAINE-BENZOCAINE 2-2-14 % EX AERO
INHALATION_SPRAY | CUTANEOUS | Status: DC | PRN
  Administered 2012-01-26: 2 via TOPICAL

## 2012-01-26 MED ORDER — PANTOPRAZOLE SODIUM 40 MG PO TBEC
40.0000 mg | DELAYED_RELEASE_TABLET | Freq: Every day | ORAL | Status: DC
  Administered 2012-01-27: 40 mg via ORAL
  Filled 2012-01-26 (×3): qty 1

## 2012-01-26 MED ORDER — EPINEPHRINE HCL 0.1 MG/ML IJ SOLN
INTRAMUSCULAR | Status: AC
  Filled 2012-01-26: qty 10

## 2012-01-26 MED ORDER — SODIUM CHLORIDE 0.9 % IV SOLN
INTRAVENOUS | Status: DC
  Administered 2012-01-26: 15:00:00 via INTRAVENOUS

## 2012-01-26 MED ORDER — FENTANYL CITRATE 0.05 MG/ML IJ SOLN
INTRAMUSCULAR | Status: DC | PRN
  Administered 2012-01-26: 12.5 ug via INTRAVENOUS
  Administered 2012-01-26: 25 ug via INTRAVENOUS
  Administered 2012-01-26: 12.5 ug via INTRAVENOUS

## 2012-01-26 NOTE — Op Note (Signed)
[facility] [address] [place], [address]   OPERATIVE PROCEDURE REPORT  PATIENT: [name], [name]  MR#: [id number] BIRTHDATE: September 26, 1950 , 61  yrs. old GENDER: Female ENDOSCOPIST: Iva Boop, MD, [facility] REFERRED BY: PROCEDURE DATE: 01/26/2012 PROCEDURE:   Small bowel enteroscopy with control of bleeding ASA CLASS:   Class III INDICATIONS:1.  melenic bleeding.   2.  iron deficiency anemia. MEDICATIONS: Fentanyl 50 mcg IV and Versed 8 mg IV TOPICAL ANESTHETIC:   Cetacaine Spray  DESCRIPTION OF PROCEDURE:   After the risks benefits and alternatives of the procedure were thoroughly explained, informed consent was obtained.  The Pentax EC-[id number] (s/n [id number]) endoscope was introduced through the mouth  and advanced to the proximal jejunum jejunum , limited by Without limitations.   The instrument was slowly withdrawn as the mucosa was fully examined.    Blood was found in the proximal jejunum.  Description: pulsatile bleeding seen from a small area without clear abnormality. There was a hint of erosion. It was treated with 3 CC EPI injection and 3 clips with successful hemostasis.  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  Description: non-bleeding.   APC ablation was performed.   The remainder of the small bowel enteroscopy exam was otherwise normal.   Retroflexed views revealed the gastric avm.    The scope was then withdrawn from the patient and the procedure terminated.  COMPLICATIONS: There were no complications. ENDOSCOPIC IMPRESSION: 1.   Blood was found in the proximal jejunum - bleeding lesion submucosal AVM vs. Dieaulafoys. 2.   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 3.   The remainder of the small bowel enteroscopy exam was otherwise normal  RECOMMENDATIONS: Full liquids observe If ok in 36 hours - home no ASA, NSAID's continue iron support REPEAT EXAM: if  needed   eSigned:  Iva Boop, MD, [facility] 01/26/2012 5:52 PM

## 2012-01-26 NOTE — Progress Notes (Signed)
Patient ID: [name]  female  BJY:[id number]    DOB: Oct 26, 1950    DOA: 01/25/2012  PCP: No primary provider on file.  Assessment/Plan: Principal Problem: *Acute blood loss anemia with Upper GI bleed: Hemoglobin 6.1, on admission, status post 2 units packed RBC - Hemoglobin still 7.7, continue PPI drip, hold aspirin  - GI consulted, endoscopy today - Patient was counseled to stop taking NSAIDs. Hold Fosamax.   Active Problems:  AKI (acute kidney injury): Likely secondary to GI bleed, dehydration, improving  - Continue gentle hydration, hold Lasix, lisinopril, metformin   DYSLIPIDEMIA: Continue statin   ANXIETY DEPRESSION: Continue Zoloft   CAD: Currently no chest pain or any symptoms. BP stable without any antihypertensives  - Hold aspirin, hold all antihypertensives for now   CHF: Currently compensated, avoid fluid overload   History of TIA/ CEREBROVASCULAR DISEASE:  - Currently no new focal neurological deficits, hold aspirin due to GI bleed   COPD: Stable, patient counseled on nicotine cessation  - Continue albuterol nebs PRN, nicotine patch   Hypertension: Currently borderline and stable - Hold all antihypertensives   Diabetes mellitus:  - Obtain hemoglobin A1c, place on sliding scale insulin   DVT prophylaxis: SCD's   CODE STATUS: FC  Disposition: Hopefully tomorrow    Subjective: Feeling somewhat better today, no chest pain, shortness of breath, nausea. NPO for endoscopy  Objective: Weight change:   Intake/Output Summary (Last 24 hours) at 01/26/12 1416 Last data filed at 01/26/12 0641  Gross per 24 hour  Intake 2547.5 ml  Output   1550 ml  Net  997.5 ml   Blood pressure 120/62, pulse 66, temperature 97.9 F (36.6 C), temperature source Oral, resp. rate 16, height 4\' 9"  (1.448 m), weight 61.3 kg (135 lb 2.3 oz), SpO2 100.00%.  Physical Exam: General: Alert and awake, oriented x3, not in any acute distress. HEENT: anicteric sclera, pupils reactive  to light and accommodation, EOMI CVS: S1-S2 clear, no murmur rubs or gallops Chest: clear to auscultation bilaterally, no wheezing, rales or rhonchi Abdomen: soft nontender, nondistended, normal bowel sounds, no organomegaly Extremities: no cyanosis, clubbing or edema noted bilaterally Neuro: non focal Lab Results: Basic Metabolic Panel:  Lab 01/26/12 0865 01/25/12 1215  NA 141 133*  K 4.1 5.6*  CL 104 95*  CO2 30 27  GLUCOSE 95 109*  BUN 56* 70*  CREATININE 1.36* 1.69*  CALCIUM 9.7 11.6*  MG -- --  PHOS -- --   Liver Function Tests: No results found for this basename: AST:2,ALT:2,ALKPHOS:2,BILITOT:2,PROT:2,ALBUMIN:2 in the last 168 hours No results found for this basename: LIPASE:2,AMYLASE:2 in the last 168 hours No results found for this basename: AMMONIA:2 in the last 168 hours CBC:  Lab 01/26/12 0605 01/25/12 2210 01/25/12 1215  WBC 3.8* -- 7.3  NEUTROABS -- -- --  HGB 7.7* 7.5* --  HCT 23.8* 22.6* --  MCV 96.0 -- 104.2*  PLT 263 -- 329   Cardiac Enzymes: No results found for this basename: CKTOTAL:3,CKMB:3,CKMBINDEX:3,TROPONINI:3 in the last 168 hours BNP: No components found with this basename: POCBNP:2 CBG:  Lab 01/26/12 1141 01/26/12 0731 01/25/12 2231 01/25/12 1730 01/25/12 1209  GLUCAP 103* 95 95 94 106*     Micro Results: No results found for this or any previous visit (from the past 240 hour(s)).  Studies/Results: Dg Chest 2 View  01/25/2012  *RADIOLOGY REPORT*  Clinical Data: Shortness of breath, weakness, hypertension, diabetes, smoker, COPD, coronary artery disease  CHEST - 2 VIEW  Comparison: 11/03/2011  Findings: Enlargement of cardiac silhouette with pulmonary vascular congestion. Peribronchial thickening. No acute infiltrate, pleural effusion or pneumothorax. Atherosclerotic calcification aortic arch. Bones unremarkable. Surgical clips right upper quadrant.  IMPRESSION: Enlargement of cardiac silhouette. Mild chronic bronchitic changes.   Original  Report Authenticated By: Ulyses Southward, M.D.     Medications: Scheduled Meds:   . ALPRAZolam  0.25 mg Oral BID  . atorvastatin  80 mg Oral q1800  . calcium carbonate  1 tablet Oral TID WC  . gemfibrozil  600 mg Oral BID AC  . insulin aspart  0-5 Units Subcutaneous QHS  . insulin aspart  0-9 Units Subcutaneous TID WC  . nicotine  21 mg Transdermal Daily  . sertraline  200 mg Oral Daily  . sodium chloride  3 mL Intravenous Q12H  . [DISCONTINUED] calcium carbonate  600 mg Oral TID WC  . [DISCONTINUED] pantoprazole (PROTONIX) IV  80 mg Intravenous Once      LOS: 1 day   Jese Comella M.D. [facility] 01/26/2012, 2:16 PM Pager: [phone]  If 7PM-7AM, please contact night-coverage [email or web address] Password [email or web address]

## 2012-01-26 NOTE — Care Management Note (Unsigned)
    Page 1 of 1   01/27/2012     1:01:10 PM   CARE MANAGEMENT NOTE 01/27/2012  Patient:  KEALEY, KEMMER   Account Number:  [id number]  Date Initiated:  01/26/2012  Documentation initiated by:  [facility]  Subjective/Objective Assessment:   ADMITTED W/UGIB.     Action/Plan:   FROM HOME.HAS PCP.   Anticipated DC Date:  01/30/2012   Anticipated DC Plan:  HOME/SELF CARE      DC Planning Services  CM consult  Medication Assistance      Choice offered to / List presented to:             Status of service:  In process, will continue to follow Medicare Important Message given?   (If response is "NO", the following Medicare IM given date fields will be blank) Date Medicare IM given:   Date Additional Medicare IM given:    Discharge Disposition:    Per UR Regulation:  Reviewed for med. necessity/level of care/duration of stay  If discussed at Long Length of Stay Meetings, dates discussed:    Comments:  01/27/12 Gem Conkle RN,BSN NCM [phone] PATIENT STATES SHE WILL BE ABLE TO PAY FOR MEDS W/HELP OF FAMILY,SHE DECLINES INDIGENT FUNDS.PROVIDED W/$4 [facility] MED LIST.  01/26/12 Malayah Demuro RN,BSN NCM [phone] QUALIFIES FOR INDIGENT FUNDS IF NEEDED.WILL 1 SET SCRIPTS FOR PHARMACY,NO NARCOTICS/3DY SUPPLY/1X USE WITHIN 75YR.

## 2012-01-26 NOTE — Progress Notes (Signed)
INITIAL ADULT NUTRITION ASSESSMENT Date: 01/26/2012   Time: 4:01 PM Reason for Assessment: MST  ASSESSMENT: Female 61 y.o.  Dx: Upper GI bleed  Hx:  Past Medical History  Diagnosis Date  . Peripheral vascular disease     status post left common iliac and external iliac artery stents  . Cerebrovascular disease   . Diabetes mellitus   . COPD (chronic obstructive pulmonary disease)   . Hyperlipidemia   . Depression   . Diastolic congestive heart failure   . Hypertension   . Renal artery stenosis     status post  . CAD (coronary artery disease)     50% left main stenosis, ostial 60% LAD stenosis, 30% ramus intermediate stenosis, occluded right artery with left-to-right collaterals  . Iron deficiency anemia   . Carotid arterial disease   . Diabetic retinopathy   . GI AVM (gastrointestinal arteriovenous vascular malformation)     small bowel   Past Surgical History  Procedure Date  . Carotid endarterectomy   . Cholecystectomy   . Appendectomy   . Tonsillectomy   . Cesarean section   . Shoulder arthroscopy   . Iliac artery stent     left    Related Meds:  Scheduled Meds:   . ALPRAZolam  0.25 mg Oral BID  . atorvastatin  80 mg Oral q1800  . calcium carbonate  1 tablet Oral TID WC  . insulin aspart  0-5 Units Subcutaneous QHS  . insulin aspart  0-9 Units Subcutaneous TID WC  . nicotine  21 mg Transdermal Daily  . sertraline  200 mg Oral Daily  . sodium chloride  3 mL Intravenous Q12H  . [DISCONTINUED] calcium carbonate  600 mg Oral TID WC  . [DISCONTINUED] gemfibrozil  600 mg Oral BID AC  . [DISCONTINUED] pantoprazole (PROTONIX) IV  80 mg Intravenous Once   Continuous Infusions:   . sodium chloride 75 mL/hr at 01/25/12 2200  . sodium chloride 20 mL/hr at 01/26/12 1507  . pantoprozole (PROTONIX) infusion 8 mg/hr (01/26/12 0700)   PRN Meds:.acetaminophen, acetaminophen, albuterol, albuterol, alum & mag hydroxide-simeth, fluticasone, HYDROcodone-acetaminophen,  HYDROmorphone (DILAUDID) injection, ondansetron (ZOFRAN) IV, ondansetron   Ht: 4\' 9"  (144.8 cm)  Wt: 135 lb 2.3 oz (61.3 kg)  Ideal Wt:98 lbs % Ideal Wt: 137%  Usual Wt: 150-155 lbs per chart review % Usual Wt: 90%  Body mass index is 29.24 kg/(m^2).  Food/Nutrition Related Hx: reported decreased appetite on admission  Labs:  CMP     Component Value Date/Time   NA 141 01/26/2012 0605   NA 135* 12/27/2011 0923   K 4.1 01/26/2012 0605   K 4.5 12/27/2011 0923   CL 104 01/26/2012 0605   CL 98 12/27/2011 0923   CO2 30 01/26/2012 0605   CO2 28 12/27/2011 0923   GLUCOSE 95 01/26/2012 0605   GLUCOSE 156* 12/27/2011 0923   BUN 56* 01/26/2012 0605   BUN 60.0* 12/27/2011 0923   CREATININE 1.36* 01/26/2012 0605   CREATININE 1.5* 12/27/2011 0923   CALCIUM 9.7 01/26/2012 0605   CALCIUM 10.1 12/27/2011 0923   PROT 5.6* 11/03/2011 2017   ALBUMIN 2.8* 11/03/2011 2017   AST 21 11/03/2011 2017   ALT 9 11/03/2011 2017   ALKPHOS 69 11/03/2011 2017   BILITOT 0.1* 11/03/2011 2017   GFRNONAA 41* 01/26/2012 0605   GFRAA 48* 01/26/2012 0605    CBC    Component Value Date/Time   WBC 3.8* 01/26/2012 0605   WBC 4.9 12/27/2011 0923   RBC  2.48* 01/26/2012 0605   RBC 1.85* 12/27/2011 0923   HGB 7.9* 01/26/2012 1510   HGB 6.2* 12/27/2011 0923   HCT 24.1* 01/26/2012 1510   HCT 18.5* 12/27/2011 0923   PLT 263 01/26/2012 0605   PLT 316 12/27/2011 0923   MCV 96.0 01/26/2012 0605   MCV 100.2 12/27/2011 0923   MCH 31.0 01/26/2012 0605   MCH 33.6 12/27/2011 0923   MCHC 32.4 01/26/2012 0605   MCHC 33.5 12/27/2011 0923   RDW 19.4* 01/26/2012 0605   RDW 17.4* 12/27/2011 0923   LYMPHSABS 0.7* 12/27/2011 0923   LYMPHSABS 0.9 11/03/2011 1139   MONOABS 0.4 12/27/2011 0923   MONOABS 0.6 11/03/2011 1139   EOSABS 0.1 12/27/2011 0923   EOSABS 0.1 11/03/2011 1139   BASOSABS 0.1 12/27/2011 0923   BASOSABS 0.0 11/03/2011 1139    Intake: NPO Output:   Intake/Output Summary (Last 24 hours) at 01/26/12 1604 Last data filed at 01/26/12  1507  Gross per 24 hour  Intake   1200 ml  Output   1700 ml  Net   -500 ml   1 BM today  Diet Order: NPO  Supplements/Tube Feeding: none at this time  IVF:    sodium chloride Last Rate: 75 mL/hr at 01/25/12 2200  sodium chloride Last Rate: 20 mL/hr at 01/26/12 1507  pantoprozole (PROTONIX) infusion Last Rate: 8 mg/hr (01/26/12 0700)    Estimated Nutritional Needs:   Kcal: 1525-1700 Protein: 73-85g Fluid: >1.8 L/day  Pt admitted for heme positive stool, off floor for endoscopy at this time.   Pt reported poor appetite on admission.  Per chart review she has lost 15 lbs over the past 3 months representing 10% of her usual wt. Lab Results  Component Value Date   HGBA1C 4.4 01/25/2012   Pt will need further assessment for possible malnutrition based on degree of recent wt loss.  NUTRITION DIAGNOSIS: -Inadequate oral intake (NI-2.1).  Status: Ongoing  RELATED TO: inability to eat  AS EVIDENCE BY: NPO, GI bleed  MONITORING/EVALUATION(Goals): 1.  Food/Beverage; diet advancement per MD discretion 2.  Wt/wt change; deter further wt loss  EDUCATION NEEDS: -Education not appropriate at this time  INTERVENTION: 1.  Modify diet; per MD discretion.  Supplements to be considered based on intake once diet advanced.   DOCUMENTATION CODES Per approved criteria  -Not Applicable    Loyce Dys, MS RD LDN Clinical Inpatient Dietitian Pager: [phone] Weekend/After hours pager: [phone]

## 2012-01-27 ENCOUNTER — Encounter (HOSPITAL_COMMUNITY): Payer: Self-pay | Admitting: Internal Medicine

## 2012-01-27 ENCOUNTER — Ambulatory Visit: Payer: Self-pay | Admitting: Internal Medicine

## 2012-01-27 DIAGNOSIS — N390 Urinary tract infection, site not specified: Secondary | ICD-10-CM

## 2012-01-27 LAB — CBC
HCT: 24.5 % — ABNORMAL LOW (ref 36.0–46.0)
Hemoglobin: 7.8 g/dL — ABNORMAL LOW (ref 12.0–15.0)
MCHC: 31.8 g/dL (ref 30.0–36.0)
MCV: 97.6 fL (ref 78.0–100.0)
RDW: 18.3 % — ABNORMAL HIGH (ref 11.5–15.5)

## 2012-01-27 LAB — URINE CULTURE

## 2012-01-27 LAB — GLUCOSE, CAPILLARY
Glucose-Capillary: 180 mg/dL — ABNORMAL HIGH (ref 70–99)
Glucose-Capillary: 121 mg/dL — ABNORMAL HIGH (ref 70–99)
Glucose-Capillary: 120 mg/dL — ABNORMAL HIGH (ref 70–99)

## 2012-01-27 MED ORDER — AMLODIPINE BESYLATE 10 MG PO TABS
10.0000 mg | ORAL_TABLET | Freq: Every day | ORAL | Status: DC
  Administered 2012-01-27 – 2012-01-28 (×2): 10 mg via ORAL
  Filled 2012-01-27 (×2): qty 1

## 2012-01-27 MED ORDER — CIPROFLOXACIN HCL 250 MG PO TABS
250.0000 mg | ORAL_TABLET | Freq: Two times a day (BID) | ORAL | Status: DC
  Administered 2012-01-27 – 2012-01-28 (×2): 250 mg via ORAL
  Filled 2012-01-27 (×4): qty 1

## 2012-01-27 MED ORDER — PANTOPRAZOLE SODIUM 40 MG PO TBEC
40.0000 mg | DELAYED_RELEASE_TABLET | Freq: Every day | ORAL | Status: DC
  Administered 2012-01-28: 40 mg via ORAL
  Filled 2012-01-27: qty 1

## 2012-01-27 NOTE — Progress Notes (Addendum)
[facility] Progress Note  Subjective:  Feels well.  No BM since procedure yesterday.  No abdominal pain.  Hgb trending upward.  Objective:  Vital signs in last 24 hours: Temp:  [98.3 F (36.8 C)-99.3 F (37.4 C)] 98.3 F (36.8 C) (12/06 0508) Pulse Rate:  [58-146] 74  (12/06 0508) Resp:  [7-33] 16  (12/06 0508) BP: (115-160)/(37-69) 143/49 mmHg (12/06 0508) SpO2:  [94 %-100 %] 96 % (12/06 0508) Last BM Date: 01/25/12 General:   Alert, Well-developed, in NAD Heart:  Regular rate and rhythm; no murmurs Pulm:  CTAB.  No W/R/R. Abdomen:  Soft, nontender and nondistended. Normal bowel sounds, without guarding, and without rebound.   Extremities:  Without edema. Neurologic:  Alert and  oriented x4;  grossly normal neurologically. Psych:  Alert and cooperative. Normal mood and affect.  Intake/Output from previous day: 12/05 0701 - 12/06 0700 In: 920 [P.O.:240; I.V.:680] Out: 1500 [Urine:1500]  Lab Results:  Basename 01/27/12 0550 01/27/12 0447 01/26/12 2229 01/26/12 0605 01/25/12 1215  WBC -- 4.5 -- 3.8* 7.3  HGB 8.4* 7.8* 7.5* -- --  HCT 26.6* 24.5* 23.4* -- --  PLT -- 283 -- 263 329   BMET  Basename 01/26/12 0605 01/25/12 1215  NA 141 133*  K 4.1 5.6*  CL 104 95*  CO2 30 27  GLUCOSE 95 109*  BUN 56* 70*  CREATININE 1.36* 1.69*  CALCIUM 9.7 11.6*   PT/INR  Basename 01/25/12 1751  LABPROT 12.0  INR 0.89    Dg Chest 2 View  01/25/2012  *RADIOLOGY REPORT*  Clinical Data: Shortness of breath, weakness, hypertension, diabetes, smoker, COPD, coronary artery disease  CHEST - 2 VIEW  Comparison: 11/03/2011  Findings: Enlargement of cardiac silhouette with pulmonary vascular congestion. Peribronchial thickening. No acute infiltrate, pleural effusion or pneumothorax. Atherosclerotic calcification aortic arch. Bones unremarkable. Surgical clips right upper quadrant.  IMPRESSION: Enlargement of cardiac silhouette. Mild chronic bronchitic changes.   Original Report  Authenticated By: Ulyses Southward, M.D.     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  Hgb stable s/p 2 units of PRBC's.  *Continue iron supplements. *Monitor Hgb and transfuse further if needed. *No ASA or NSAID's.   LOS: 2 days   ZEHR, JESSICA D.  01/27/2012, 9:11 AM  Pager number [phone]   [facility] Attending  I have also seen and assessed the patient and agree with the above note. She should be able [place] home tomorrow barring problems. I will have my office contact her for repeat labs and at least phone follow-up. Please keep Dr. Dalene Carrow in the loop.   Iva Boop, MD, [facility] [phone] (pager) 01/27/2012 6:21 PM

## 2012-01-27 NOTE — Progress Notes (Signed)
Patient ID: [name]  female  ZOX:[id number]    DOB: 12/18/1950    DOA: 01/25/2012  PCP: No primary provider on file.  Assessment/Plan: Principal Problem: *Acute blood loss anemia with Upper GI bleed: Hemoglobin 6.1, on admission, status post 2 units packed RBC - Hemoglobin 8.4, trending up continue PPI drip, hold aspirin  - GI following, endoscopy done, showed bleeding lesion in the proximal jejunum submucosal vs Dieaulafoys - Patient was counseled to stop taking NSAIDs. Hold Fosamax.  - Diet advanced today  Escherichia coli UTI: Per sensitivities started on ciprofloxacin  Active Problems:  AKI (acute kidney injury): Likely secondary to GI bleed, dehydration, improving  - hold Lasix, lisinopril, metformin. BMET in AM.   DYSLIPIDEMIA: Continue statin   ANXIETY DEPRESSION: Continue Zoloft   CAD: Currently no chest pain or any symptoms. BP stable without any antihypertensives  - Hold aspirin, restart norvasc. Cont lipitor  CHF: Currently compensated, avoid fluid overload   History of TIA/ CEREBROVASCULAR DISEASE:  - Currently no new focal neurological deficits, hold aspirin due to GI bleed   COPD: Stable, patient counseled on nicotine cessation  - Continue albuterol nebs PRN, nicotine patch   Hypertension: Currently borderline and stable - Hold all antihypertensives   Diabetes mellitus:  - Obtain hemoglobin A1c, place on sliding scale insulin   DVT prophylaxis: SCD's   CODE STATUS: FC  Disposition: awaiting GI rec's, possibly today or tomorrow     Subjective: Feeling somewhat better today, no chest pain, shortness of breath, nausea. Advanced diet today  Objective: Weight change:   Intake/Output Summary (Last 24 hours) at 01/27/12 1448 Last data filed at 01/27/12 1100  Gross per 24 hour  Intake   1160 ml  Output   1500 ml  Net   -340 ml   Blood pressure 123/45, pulse 74, temperature 98.3 F (36.8 C), temperature source Oral, resp. rate 18, height 4\' 9"   (1.448 m), weight 61.3 kg (135 lb 2.3 oz), SpO2 97.00%.  Physical Exam: General: Alert and awake, oriented x3, not in any acute distress. HEENT: anicteric sclera, pupils reactive to light and accommodation, EOMI CVS: S1-S2 clear, no murmur rubs or gallops Chest: clear to auscultation bilaterally, no wheezing, rales or rhonchi Abdomen: soft nontender, nondistended, normal bowel sounds, no organomegaly Extremities: no cyanosis, clubbing or edema noted bilaterally Neuro: non focal Lab Results: Basic Metabolic Panel:  Lab 01/26/12 8119 01/25/12 1215  NA 141 133*  K 4.1 5.6*  CL 104 95*  CO2 30 27  GLUCOSE 95 109*  BUN 56* 70*  CREATININE 1.36* 1.69*  CALCIUM 9.7 11.6*  MG -- --  PHOS -- --   CBC:  Lab 01/27/12 0550 01/27/12 0447 01/26/12 0605  WBC -- 4.5 3.8*  NEUTROABS -- -- --  HGB 8.4* 7.8* --  HCT 26.6* 24.5* --  MCV -- 97.6 96.0  PLT -- 283 263  CBG:  Lab 01/27/12 1149 01/27/12 0800 01/26/12 2123 01/26/12 1831 01/26/12 1141  GLUCAP 180* 98 124* 70 103*     Micro Results: Recent Results (from the past 240 hour(s))  URINE CULTURE     Status: Normal   Collection Time   01/25/12 12:46 PM      Component Value Range Status Comment   Specimen Description URINE, CLEAN CATCH   Final    Special Requests NONE   Final    Culture  Setup Time 01/25/2012 18:46   Final    Colony Count 15,000 COLONIES/ML   Final  Culture ESCHERICHIA COLI   Final    Report Status 01/27/2012 FINAL   Final    Organism ID, Bacteria ESCHERICHIA COLI   Final   URINE CULTURE     Status: Normal (Preliminary result)   Collection Time   01/26/12  6:27 AM      Component Value Range Status Comment   Specimen Description URINE, RANDOM   Final    Special Requests NONE   Final    Culture  Setup Time 01/26/2012 09:25   Final    Colony Count 40,000 COLONIES/ML   Final    Culture ESCHERICHIA COLI   Final    Report Status PENDING   Incomplete     Studies/Results: Dg Chest 2 View  01/25/2012   *RADIOLOGY REPORT*  Clinical Data: Shortness of breath, weakness, hypertension, diabetes, smoker, COPD, coronary artery disease  CHEST - 2 VIEW  Comparison: 11/03/2011  Findings: Enlargement of cardiac silhouette with pulmonary vascular congestion. Peribronchial thickening. No acute infiltrate, pleural effusion or pneumothorax. Atherosclerotic calcification aortic arch. Bones unremarkable. Surgical clips right upper quadrant.  IMPRESSION: Enlargement of cardiac silhouette. Mild chronic bronchitic changes.   Original Report Authenticated By: Ulyses Southward, M.D.     Medications: Scheduled Meds:    . ALPRAZolam  0.25 mg Oral BID  . atorvastatin  80 mg Oral q1800  . calcium carbonate  1 tablet Oral TID WC  . insulin aspart  0-5 Units Subcutaneous QHS  . insulin aspart  0-9 Units Subcutaneous TID WC  . nicotine  21 mg Transdermal Daily  . pantoprazole  40 mg Oral QAC breakfast  . sertraline  200 mg Oral Daily  . sodium chloride  3 mL Intravenous Q12H      LOS: 2 days   RAI,RIPUDEEP M.D. [facility] 01/27/2012, 2:48 PM Pager: [phone]  If 7PM-7AM, please contact night-coverage [email or web address] Password [email or web address]

## 2012-01-28 DIAGNOSIS — Q279 Congenital malformation of peripheral vascular system, unspecified: Secondary | ICD-10-CM

## 2012-01-28 DIAGNOSIS — N179 Acute kidney failure, unspecified: Secondary | ICD-10-CM

## 2012-01-28 DIAGNOSIS — I679 Cerebrovascular disease, unspecified: Secondary | ICD-10-CM

## 2012-01-28 DIAGNOSIS — D649 Anemia, unspecified: Secondary | ICD-10-CM

## 2012-01-28 DIAGNOSIS — D62 Acute posthemorrhagic anemia: Secondary | ICD-10-CM

## 2012-01-28 LAB — CBC
Hemoglobin: 8.3 g/dL — ABNORMAL LOW (ref 12.0–15.0)
Platelets: 294 10*3/uL (ref 150–400)
RBC: 2.72 MIL/uL — ABNORMAL LOW (ref 3.87–5.11)
WBC: 5.6 10*3/uL (ref 4.0–10.5)

## 2012-01-28 LAB — BASIC METABOLIC PANEL
CO2: 26 mEq/L (ref 19–32)
Calcium: 8.8 mg/dL (ref 8.4–10.5)
GFR calc non Af Amer: 63 mL/min — ABNORMAL LOW (ref >90)
Glucose, Bld: 98 mg/dL (ref 70–99)
Potassium: 3.6 mEq/L (ref 3.5–5.1)
Sodium: 141 mEq/L (ref 135–145)

## 2012-01-28 LAB — GLUCOSE, CAPILLARY: Glucose-Capillary: 106 mg/dL — ABNORMAL HIGH (ref 70–99)

## 2012-01-28 MED ORDER — PANTOPRAZOLE SODIUM 40 MG PO TBEC: 40.0000 mg | DELAYED_RELEASE_TABLET | Freq: Every day | ORAL | Status: DC

## 2012-01-28 MED ORDER — CIPROFLOXACIN HCL 250 MG PO TABS: 250.0000 mg | ORAL_TABLET | Freq: Two times a day (BID) | ORAL | Status: DC

## 2012-01-28 NOTE — Discharge Summary (Signed)
Physician Discharge Summary  Patient ID: [name] MRN: [id number] DOB/AGE: 61-31-1952 61 y.o.  Admit date: 01/25/2012 Discharge date: 01/28/2012  Primary Care Physician:  Burtis Junes, MD  Discharge Diagnoses:    . Acute blood loss anemia . 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 Dieaulafoy's lesion . Dehydration . AKI (acute kidney injury): Resolved  . TIA . Hypertension . COPD . CHF . CEREBROVASCULAR DISEASE . CAD . ANXIETY DEPRESSION . DYSLIPIDEMIA . Diabetes mellitus . Hyperkalemia . Iron deficiency anemia . Gastric angiodysplasia   E Coli UTI  Consults:  Gastroenterology, Dr. Leone Payor   Discharge Medications:   Medication List     As of 01/28/2012 11:54 AM    TAKE these medications         acetaminophen 500 MG tablet   Commonly known as: TYLENOL   Take 500 mg by mouth every 6 (six) hours as needed. For pain      albuterol 108 (90 BASE) MCG/ACT inhaler   Commonly known as: PROVENTIL HFA;VENTOLIN HFA   Inhale 2 puffs into the lungs every 6 (six) hours as needed. For shortness of breath      ALPRAZolam 0.25 MG tablet   Commonly known as: XANAX   Take 0.25 mg by mouth 2 (two) times daily.      amLODipine 10 MG tablet   Commonly known as: NORVASC   Take 10 mg by mouth daily.      calcium carbonate 600 MG Tabs   Commonly known as: OS-CAL   Take 600 mg by mouth 3 (three) times daily with meals.      ciprofloxacin 250 MG tablet   Commonly known as: CIPRO   Take 1 tablet (250 mg total) by mouth 2 (two) times daily. X 3days      cloNIDine 0.1 MG tablet   Commonly known as: CATAPRES   Take 0.1 mg by mouth 2 (two) times daily.      fexofenadine 180 MG tablet   Commonly known as: ALLEGRA   Take 180 mg by mouth daily as needed. For allergies      fish oil-omega-3 fatty acids 1000 MG capsule   Take 1 g by mouth 3 (three) times daily. Three times daily      fluticasone 50 MCG/ACT nasal spray   Commonly known as: FLONASE   Place 2 sprays into the  nose daily as needed. For allergies      FOSAMAX PO   Take 70 mg by mouth once a week.      furosemide 80 MG tablet   Commonly known as: LASIX   Take 0.5 tablets (40 mg total) by mouth daily.      gemfibrozil 600 MG tablet   Commonly known as: LOPID   Take 600 mg by mouth 2 (two) times daily before a meal.      Iron 325 (65 FE) MG Tabs   Take 1 tablet by mouth 3 (three) times daily.      lisinopril 10 MG tablet   Commonly known as: PRINIVIL,ZESTRIL   Take 4 tablets (40 mg total) by mouth daily.      metFORMIN 1000 MG tablet   Commonly known as: GLUCOPHAGE   Take 1,000 mg by mouth 2 (two) times daily with a meal.      multivitamin with minerals Tabs   Take 1 tablet by mouth daily.      pantoprazole 40 MG tablet   Commonly known as: PROTONIX   Take 1 tablet (40 mg  total) by mouth daily.      potassium chloride 10 MEQ tablet   Commonly known as: K-DUR   Take 10 mEq by mouth.      rosuvastatin 40 MG tablet   Commonly known as: CRESTOR   Take 40 mg by mouth every evening.      ZOLOFT 100 MG tablet   Generic drug: sertraline   Take 200 mg by mouth daily.         Brief H and P: For complete details please refer to admission H and P, but in brief Patient is a 61 year old female with history of chronic anemia, history of CVA, diabetes, COPD, hypertension, CAD on iron replacement therapy presented to ED with dizziness, lightheadedness and palpitation for last 2-3 days. Patient reported that she has had anemia in the past however no specific etiology was found. She also admitted to having dark stools in the last 3 days prior to admission however she attributed that to the iron she was taking. She also reported nausea, poor appetite, mild epigastric abdominal pain with mild dyspnea on ambulation and worsening in the last 3 days. In ED, patient was found to have hemoglobin of 6.1, baseline around 10, stool Hemoccult was positive.  Also, patient reported that she takes aspirin 81  mg daily, she stopped taking Prilosec 2 weeks ago. She started taking over-the-counter Zantac because it was cheaper.    Hospital Course:  Acute blood loss anemia with Upper GI bleed: Hemoglobin 6.1, on admission. She was admitted to telemetry floor, was transfused 2 units of packed RBC. Hemoglobin remained stable and 8.3 at discharge. She was placed on PPI drip. Aspirin was held and patient was also counseled to stop taking aspirin and NSAIDs. GI was consulted and endoscopy was done which showed bleeding lesion in the proximal jejunum submucosal vs Dieaulafoys lesion. She's tolerating solid diet, hemoglobin stable, she'll followup with hematology and gastroenterology in 2 weeks, follow labs  Escherichia coli UTI: Per sensitivities started on ciprofloxacin.   AKI (acute kidney injury): Cr 1.69 on admission, resolved, likely secondary to GI bleed, dehydration. Lasix, lisinopril and metformin. Patient can resume her meds per home doses.  DYSLIPIDEMIA: Continue statin   ANXIETY DEPRESSION: Continue Zoloft   CAD/history of CHF: patient had no chest pain or any symptoms.      Day of Discharge BP 160/44  Pulse 75  Temp 98.2 F (36.8 C) (Oral)  Resp 18  Ht 4\' 9"  (1.448 m)  Wt 61.3 kg (135 lb 2.3 oz)  BMI 29.24 kg/m2  SpO2 96%  Physical Exam: General: Alert and awake oriented x3 not in any acute distress. HEENT: anicteric sclera, pupils reactive to light and accommodation CVS: S1-S2 clear no murmur rubs or gallops Chest: clear to auscultation bilaterally, no wheezing rales or rhonchi Abdomen: soft nontender, nondistended, normal bowel sounds, no organomegaly Extremities: no cyanosis, clubbing or edema noted bilaterally Neuro: Cranial nerves II-XII intact, no focal neurological deficits   The results of significant diagnostics from this hospitalization (including imaging, microbiology, ancillary and laboratory) are listed below for reference.    LAB RESULTS: Basic Metabolic  Panel:  Lab 01/28/12 0605 01/26/12 0605  NA 141 141  K 3.6 4.1  CL 107 104  CO2 26 30  GLUCOSE 98 95  BUN 24* 56*  CREATININE 0.96 1.36*  CALCIUM 8.8 9.7  MG -- --  PHOS -- --   CBC:  Lab 01/28/12 0605 01/27/12 0550 01/27/12 0447  WBC 5.6 -- 4.5  NEUTROABS -- -- --  HGB 8.3* 8.4* --  HCT 27.0* 26.6* --  MCV 99.3 -- --  PLT 294 -- 283  CBG:  Lab 01/27/12 2208 01/27/12 1655  GLUCAP 120* 121*    Significant Diagnostic Studies:  Dg Chest 2 View  01/25/2012  *RADIOLOGY REPORT*  Clinical Data: Shortness of breath, weakness, hypertension, diabetes, smoker, COPD, coronary artery disease  CHEST - 2 VIEW  Comparison: 11/03/2011  Findings: Enlargement of cardiac silhouette with pulmonary vascular congestion. Peribronchial thickening. No acute infiltrate, pleural effusion or pneumothorax. Atherosclerotic calcification aortic arch. Bones unremarkable. Surgical clips right upper quadrant.  IMPRESSION: Enlargement of cardiac silhouette. Mild chronic bronchitic changes.   Original Report Authenticated By: Ulyses Southward, M.D.      Disposition and Follow-up:     Discharge Orders    Future Appointments: Provider: Department: Dept Phone: Center:   03/07/2012 9:30 AM Delcie Roch [facility] [phone] None   03/07/2012 10:00 AM Laurice Record, MD [facility] [phone] None   03/07/2012 11:00 AM [facility] [phone] None     Future Orders Please Complete By Expires   Diet Carb Modified      Increase activity slowly      (HEART FAILURE PATIENTS) Call MD:  Anytime you have any of the following symptoms: 1) 3 pound weight gain in 24 hours or 5 pounds in 1 week 2) shortness of breath, with or without a dry hacking cough 3) swelling in the hands, feet or stomach 4) if you have to sleep on extra pillows at night in order to breathe.      Discharge instructions      Comments:    Please follow-up with Dr Lowry Ram in 7-10 days, follow CBC on the appt       DISPOSITION: home  DIET: carb modified diet ACTIVITY: as tolerated TESTS THAT NEED FOLLOW-UP CBC in a week  DISCHARGE FOLLOW-UP Follow-up Information    Follow up with Yancey Flemings, MD. Schedule an appointment as soon as possible for a visit in 2 weeks. (for follow-up and CBC labs)    Contact information:   [address] [place] [address] [phone]       Follow up with ODOGWU,LAURETTA I., MD. Schedule an appointment as soon as possible for a visit in 2 weeks. (for follow-up)    Contact information:   [address] [place] [address] [phone]          Time spent on Discharge: 35 mins  Signed:   RAI,RIPUDEEP M.D. [facility] 01/28/2012, 11:54 AM Pager: [phone]

## 2012-01-29 LAB — TYPE AND SCREEN
Antibody Screen: NEGATIVE
Unit division: 0

## 2012-01-31 ENCOUNTER — Other Ambulatory Visit: Payer: Self-pay

## 2012-01-31 DIAGNOSIS — K922 Gastrointestinal hemorrhage, unspecified: Secondary | ICD-10-CM

## 2012-02-02 LAB — URINE CULTURE

## 2012-02-08 ENCOUNTER — Ambulatory Visit (HOSPITAL_COMMUNITY)
Admission: RE | Admit: 2012-02-08 | Discharge: 2012-02-08 | Disposition: A | Discharge status: Home / Self Care | Payer: Self-pay | Source: Ambulatory Visit | Attending: Internal Medicine | Admitting: Internal Medicine

## 2012-02-08 ENCOUNTER — Other Ambulatory Visit (HOSPITAL_COMMUNITY): Payer: Self-pay | Admitting: Internal Medicine

## 2012-02-08 DIAGNOSIS — R0602 Shortness of breath: Secondary | ICD-10-CM | POA: Insufficient documentation

## 2012-02-08 DIAGNOSIS — I517 Cardiomegaly: Secondary | ICD-10-CM | POA: Insufficient documentation

## 2012-02-08 DIAGNOSIS — R05 Cough: Secondary | ICD-10-CM | POA: Insufficient documentation

## 2012-02-08 DIAGNOSIS — R059 Cough, unspecified: Secondary | ICD-10-CM | POA: Insufficient documentation

## 2012-02-11 ENCOUNTER — Telehealth: Payer: Self-pay | Admitting: *Deleted

## 2012-02-11 ENCOUNTER — Telehealth: Payer: Self-pay | Admitting: Oncology

## 2012-02-11 NOTE — Telephone Encounter (Signed)
S/w the pt and she is aware of the change in the md from dr odogwu to dr Cyndie Chime with the updated d/t of the new appts

## 2012-02-11 NOTE — Telephone Encounter (Signed)
misake 

## 2012-02-11 NOTE — Telephone Encounter (Signed)
Have adjusted 1/15 appt to follow new MD resign appt. Crystal to call patient.   JMW

## 2012-02-25 ENCOUNTER — Encounter: Payer: Self-pay | Admitting: Oncology

## 2012-03-07 ENCOUNTER — Ambulatory Visit: Payer: Self-pay | Admitting: Hematology and Oncology

## 2012-03-07 ENCOUNTER — Ambulatory Visit: Payer: Self-pay | Admitting: Nurse Practitioner

## 2012-03-07 ENCOUNTER — Ambulatory Visit (HOSPITAL_BASED_OUTPATIENT_CLINIC_OR_DEPARTMENT_OTHER): Payer: Self-pay | Admitting: Nurse Practitioner

## 2012-03-07 ENCOUNTER — Other Ambulatory Visit (HOSPITAL_BASED_OUTPATIENT_CLINIC_OR_DEPARTMENT_OTHER): Payer: Self-pay | Admitting: Lab

## 2012-03-07 ENCOUNTER — Ambulatory Visit: Payer: Self-pay

## 2012-03-07 ENCOUNTER — Telehealth: Payer: Self-pay | Admitting: Oncology

## 2012-03-07 ENCOUNTER — Encounter: Payer: Self-pay | Admitting: Oncology

## 2012-03-07 VITALS — BP 152/54 | HR 75 | Temp 98.4°F | Resp 20 | Ht 59.0 in | Wt 138.1 lb

## 2012-03-07 DIAGNOSIS — D500 Iron deficiency anemia secondary to blood loss (chronic): Secondary | ICD-10-CM

## 2012-03-07 DIAGNOSIS — F329 Major depressive disorder, single episode, unspecified: Secondary | ICD-10-CM

## 2012-03-07 DIAGNOSIS — E119 Type 2 diabetes mellitus without complications: Secondary | ICD-10-CM

## 2012-03-07 DIAGNOSIS — D539 Nutritional anemia, unspecified: Secondary | ICD-10-CM

## 2012-03-07 DIAGNOSIS — J449 Chronic obstructive pulmonary disease, unspecified: Secondary | ICD-10-CM

## 2012-03-07 DIAGNOSIS — Q2730 Arteriovenous malformation, site unspecified: Secondary | ICD-10-CM

## 2012-03-07 DIAGNOSIS — D509 Iron deficiency anemia, unspecified: Secondary | ICD-10-CM

## 2012-03-07 DIAGNOSIS — K922 Gastrointestinal hemorrhage, unspecified: Secondary | ICD-10-CM

## 2012-03-07 LAB — CBC WITH DIFFERENTIAL/PLATELET
Basophils Absolute: 0.1 10*3/uL (ref 0.0–0.1)
EOS%: 1.4 % (ref 0.0–7.0)
HGB: 10.7 g/dL — ABNORMAL LOW (ref 11.6–15.9)
MCH: 29.6 pg (ref 25.1–34.0)
MCHC: 33.6 g/dL (ref 31.5–36.0)
MCV: 88.3 fL (ref 79.5–101.0)
MONO%: 8.7 % (ref 0.0–14.0)
NEUT%: 74.8 % (ref 38.4–76.8)
RDW: 17.9 % — ABNORMAL HIGH (ref 11.2–14.5)

## 2012-03-07 LAB — IRON AND TIBC: UIBC: 285 ug/dL (ref 125–400)

## 2012-03-07 NOTE — Progress Notes (Signed)
I gave the patient a new application for assistance. She says her hubby gets 910.00 now and she not working. I advised just need bank statement and proof of his income and get it back to me.

## 2012-03-07 NOTE — Telephone Encounter (Signed)
s.w. pt husband and advised taht i am mailing appt schedule for March, May and July.[name] KitchenMarland KitchenMarland [name]

## 2012-03-07 NOTE — Progress Notes (Signed)
OFFICE PROGRESS NOTE  Interval history:  [name] is a 62 year old woman formerly followed by [name] for iron deficiency anemia. Bone marrow biopsy on 10/20/2005 showed a normocellular marrow with trilineage hematopoiesis and decreased storage iron. Cytogenetic analysis showed the presence of normal female chromosomes with no observable clonal chromosomal abnormalities.   She presents today to establish care with [name].  She has a history of AVMs. She receives periodic infusions of IV iron. She last received IV iron on 12/30/2011 in the form of Feraheme at which time the hemoglobin was 6.2 and ferritin 77.  She was hospitalized 01/25/2012 through 01/28/2012 with a gastrointestinal bleed. Hemoglobin at presentation was 6.1. Stool was positive for occult blood. She received a blood transfusion. She underwent a small bowel enteroscopy by [name] on 01/26/2012 with findings of blood in the proximal jejunum with a bleeding lesion submucosal AVM versus Dieaulafoys and in a female formation in the body of the stomach with ablation performed. Hemoglobin at time of discharge on 01/28/2012 was 8.3.  She has multiple other medical problems including coronary artery disease/MI June 2007, CHF with diastolic dysfunction, type 2 diabetes, hyperlipidemia, hypertension, COPD, peripheral vascular disease, cerebrovascular disease and GERD.  She reports being diagnosed with pneumonia following the GI bleed in December 2013. She is no longer short of breath but continues to have a cough. No fevers. No sweats. She reports a good appetite. No weight loss. No nausea or vomiting. Bowels moving regularly. No hematochezia or melena. She is typically able to tell when she is losing blood through her GI tract by a change in the color and consistency of her stools. She reports being compliant with ferrous sulfate 3 times daily.  She quit smoking following Christmas 2013. She reports smoking a half-pack per day  for 36 years. She is currently using "E cigarettes". She denies alcohol use. She is married. She has 2 children. She reports her son has a learning disability.   Objective: Blood pressure 152/54, pulse 75, temperature 98.4 F (36.9 C), temperature source Oral, resp. rate 20, height 4\' 11"  (1.499 m), weight 138 lb 1.6 oz (62.642 kg).  Multiple missing teeth. No thrush or ulceration. No palpable cervical, supraclavicular or axillary lymph nodes. Scar at the left neck. Inspiratory rales at the right lung base. Regular cardiac rhythm. Abdomen is soft and nontender. No organomegaly. Trace lower leg edema bilaterally.  Lab Results: Lab Results  Component Value Date   WBC 7.7 03/07/2012   HGB 10.7* 03/07/2012   HCT 31.8* 03/07/2012   MCV 88.3 03/07/2012   PLT 233 03/07/2012    Chemistry:    Chemistry      Component Value Date/Time   NA 141 01/28/2012 0605   NA 135* 12/27/2011 0923   K 3.6 01/28/2012 0605   K 4.5 12/27/2011 0923   CL 107 01/28/2012 0605   CL 98 12/27/2011 0923   CO2 26 01/28/2012 0605   CO2 28 12/27/2011 0923   BUN 24* 01/28/2012 0605   BUN 60.0* 12/27/2011 0923   CREATININE 0.96 01/28/2012 0605   CREATININE 1.5* 12/27/2011 0923      Component Value Date/Time   CALCIUM 8.8 01/28/2012 0605   CALCIUM 10.1 12/27/2011 0923   ALKPHOS 69 11/03/2011 2017   AST 21 11/03/2011 2017   ALT 9 11/03/2011 2017   BILITOT 0.1* 11/03/2011 2017       Studies/Results: Dg Chest 2 View  02/08/2012  *RADIOLOGY REPORT*  Clinical Data: Cough and short of breath  CHEST - 2 VIEW  Comparison: 01/25/2012  Findings: Mild cardiac enlargement.  Vascular congestion with prominent interstitial markings compatible with interstitial edema. These were not present previously.  No layering effusion.  There is underlying COPD.  Negative for pneumonia.  IMPRESSION: Cardiac enlargement with interstitial edema compatible with mild fluid overload.   Original Report Authenticated By: [name], M.D.     Medications: I  have reviewed the patient's current medications.  Assessment/Plan:  1. Iron deficiency anemia related to GI blood loss. 2. Hospitalization with gastrointestinal bleeding 01/25/2012 with documented AVM in the stomach and possibly jejunum on enteroscopy 01/26/2012. 3. Coronary artery disease/MI June 2007. 4. Diastolic congestive heart failure. 5. Peripheral vascular disease. 6. Cerebrovascular disease. 7. Type 2 diabetes. 8. COPD. 9. Hyperlipidemia. 10. Depression. 11. History of tobacco use.  Disposition-[name] appears to have iron deficiency anemia related to GI blood loss due to AVMs. Hemoglobin today is adequate. We will followup on a ferritin from today. We will begin a schedule of every 2 month CBC and ferritin checks with a return visit in 6 months. She will contact the office in the interim with any problems. We specifically reviewed signs/symptoms suggestive of progressive anemia.  Patient was seen with [name].  [name] ANP/GNP-BC

## 2012-04-02 ENCOUNTER — Other Ambulatory Visit: Payer: Self-pay | Admitting: Lab

## 2012-04-04 ENCOUNTER — Ambulatory Visit: Payer: Self-pay | Admitting: Family

## 2012-04-19 ENCOUNTER — Ambulatory Visit: Payer: Self-pay | Admitting: Cardiology

## 2012-05-01 ENCOUNTER — Other Ambulatory Visit: Payer: Self-pay | Admitting: Lab

## 2012-05-14 ENCOUNTER — Encounter (INDEPENDENT_AMBULATORY_CARE_PROVIDER_SITE_OTHER): Payer: Self-pay

## 2012-05-14 ENCOUNTER — Encounter: Payer: Self-pay | Admitting: Cardiology

## 2012-05-14 ENCOUNTER — Ambulatory Visit (INDEPENDENT_AMBULATORY_CARE_PROVIDER_SITE_OTHER): Payer: Self-pay | Admitting: Cardiology

## 2012-05-14 VITALS — BP 122/68 | HR 59 | Ht 59.0 in | Wt 140.0 lb

## 2012-05-14 DIAGNOSIS — E7800 Pure hypercholesterolemia, unspecified: Secondary | ICD-10-CM

## 2012-05-14 DIAGNOSIS — I2510 Atherosclerotic heart disease of native coronary artery without angina pectoris: Secondary | ICD-10-CM

## 2012-05-14 DIAGNOSIS — I701 Atherosclerosis of renal artery: Secondary | ICD-10-CM

## 2012-05-14 DIAGNOSIS — I10 Essential (primary) hypertension: Secondary | ICD-10-CM

## 2012-05-14 DIAGNOSIS — I6529 Occlusion and stenosis of unspecified carotid artery: Secondary | ICD-10-CM

## 2012-05-14 DIAGNOSIS — I679 Cerebrovascular disease, unspecified: Secondary | ICD-10-CM

## 2012-05-14 DIAGNOSIS — I080 Rheumatic disorders of both mitral and aortic valves: Secondary | ICD-10-CM

## 2012-05-14 DIAGNOSIS — F17200 Nicotine dependence, unspecified, uncomplicated: Secondary | ICD-10-CM

## 2012-05-14 DIAGNOSIS — I739 Peripheral vascular disease, unspecified: Secondary | ICD-10-CM

## 2012-05-14 DIAGNOSIS — I509 Heart failure, unspecified: Secondary | ICD-10-CM

## 2012-05-14 NOTE — Patient Instructions (Addendum)
The current medical regimen is effective;  continue present plan and medications.  Please return for fasting blood work (lipid)  Follow up in 1 year with Dr Antoine Poche.  You will receive a letter in the mail 2 months before you are due.  Please call us when you receive this letter to schedule your follow up appointment.

## 2012-05-14 NOTE — Progress Notes (Signed)
HPI The patient presents for followup of multiple cardiovascular risk factors and known coronary disease. Since I last saw her she said no new cardiovascular problems. She's had cauterization of an AVM. Of note she was taken off her aspirin in December. She is not smoking and is using the electronic cigarettes.  She's not particularly active. With her level of activity he's not describing any chest pressure, neck or arm discomfort. She's not describing any palpitations, presyncope or syncope. She has no PND or orthopnea.  Unfortunately she is still smoking cigarettes.   She has some chronic dyspnea on exertion but is not having any new shortness of breath, PND or orthopnea. She has had no weight gain or edema.  She does chase after her grandchild and with this has had no symptoms.  No Known Allergies  Current Outpatient Prescriptions  Medication Sig Dispense Refill  . acetaminophen (TYLENOL) 500 MG tablet Take 500 mg by mouth every 6 (six) hours as needed. For pain      . albuterol (PROVENTIL HFA;VENTOLIN HFA) 108 (90 BASE) MCG/ACT inhaler Inhale 2 puffs into the lungs every 6 (six) hours as needed. For shortness of breath       . Alendronate Sodium (FOSAMAX PO) Take 70 mg by mouth once a week.       . ALPRAZolam (XANAX) 0.25 MG tablet Take 0.25 mg by mouth 2 (two) times daily.       [name] amLODipine (NORVASC) 10 MG tablet Take 10 mg by mouth daily.       . calcium carbonate (OS-CAL) 600 MG TABS Take 600 mg by mouth 3 (three) times daily with meals.       . cloNIDine (CATAPRES) 0.1 MG tablet Take 0.1 mg by mouth 2 (two) times daily.      . Ferrous Sulfate (IRON) 325 (65 FE) MG TABS Take 1 tablet by mouth 3 (three) times daily.       . fexofenadine (ALLEGRA) 180 MG tablet Take 180 mg by mouth daily as needed. For allergies       . fish oil-omega-3 fatty acids 1000 MG capsule Take 1 g by mouth 3 (three) times daily. Three times daily      . fluticasone (FLONASE) 50 MCG/ACT nasal spray Place 2 sprays  into the nose daily as needed. For allergies       . furosemide (LASIX) 80 MG tablet Take 0.5 tablets (40 mg total) by mouth daily.  30 tablet  0  . lisinopril (PRINIVIL,ZESTRIL) 10 MG tablet Take 4 tablets (40 mg total) by mouth daily.  30 tablet  0  . metFORMIN (GLUCOPHAGE) 1000 MG tablet Take 1,000 mg by mouth 2 (two) times daily with a meal.        . Multiple Vitamin (MULTIVITAMIN WITH MINERALS) TABS Take 1 tablet by mouth daily.      . pantoprazole (PROTONIX) 40 MG tablet Take 1 tablet (40 mg total) by mouth daily.  30 tablet  3  . potassium chloride (K-DUR) 10 MEQ tablet Take 10 mEq by mouth.        . rosuvastatin (CRESTOR) 40 MG tablet Take 40 mg by mouth every evening.       . sertraline (ZOLOFT) 100 MG tablet Take 200 mg by mouth daily.       . [DISCONTINUED] loratadine (CLARITIN) 10 MG tablet Take 10 mg by mouth daily.        . [DISCONTINUED] losartan (COZAAR) 100 MG tablet Take 100 mg by mouth daily.        . [  DISCONTINUED] metoprolol tartrate (LOPRESSOR) 25 MG tablet Take 25 mg by mouth daily.        No current facility-administered medications for this visit.    Past Medical History  Diagnosis Date  . Peripheral vascular disease     status post left common iliac and external iliac artery stents  . Cerebrovascular disease   . Diabetes mellitus   . COPD (chronic obstructive pulmonary disease)   . Hyperlipidemia   . Depression   . Diastolic congestive heart failure   . Hypertension   . Renal artery stenosis     status post  . CAD (coronary artery disease)     50% left main stenosis, ostial 60% LAD stenosis, 30% ramus intermediate stenosis, occluded right artery with left-to-right collaterals  . Iron deficiency anemia   . Carotid arterial disease   . Diabetic retinopathy   . 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 Dieaulafoy's lesion 01/25/2012    Past Surgical History  Procedure Laterality  Date  . Carotid endarterectomy    . Cholecystectomy    . Appendectomy    . Tonsillectomy    . Cesarean section    . Shoulder arthroscopy    . Iliac artery stent      left  . Enteroscopy  01/26/2012    Procedure: ENTEROSCOPY;  Surgeon: Iva Boop, MD;  Location: [facility];  Service: Endoscopy;  Laterality: N/A;    ROS:  GERD.  Otherwise as stated in the HPI and negative for all other systems.  PHYSICAL EXAM BP 122/68  Pulse 59  Ht 4\' 11"  (1.499 m)  Wt 140 lb (63.504 kg)  BMI 28.26 kg/m2 GENERAL:  Well appearing HEENT:  Pupils equal round and reactive, fundi not visualized, oral mucosa unremarkable, poor dentition NECK:  No jugular venous distention, waveform within normal limits, carotid upstroke brisk and symmetric, bilateral bruits, left CEA scar no thyromegaly LYMPHATICS:  No cervical, inguinal adenopathy LUNGS:  Clear to auscultation bilaterally BACK:  No CVA tenderness CHEST:  Unremarkable HEART:  PMI not displaced or sustained,S1 and S2 within normal limits, no S3, no S4, no clicks, no rubs, soft apical systolic murmur ABD:  Flat, positive bowel sounds normal in frequency in pitch, no bruits, no rebound, no guarding, no midline pulsatile mass, no hepatomegaly, no splenomegaly EXT:  2 plus pulses upper absent DP/PT, trace edema, no cyanosis no clubbing SKIN:  No rashes no nodules NEURO:  Cranial nerves II through XII grossly intact, motor grossly intact throughout PSYCH:  Cognitively intact, oriented to person place and time  EKG  sinus rhythm, rate 59, axis within normal limits, intervals within normal limits, no acute ST-T wave changes. 05/14/2012  ASSESSMENT AND PLAN  CAD:  The patient has no new sypmtoms.  No further cardiovascular testing is indicated.  We will continue with aggressive risk reduction and meds as listed.  I did send a note to Dr. Leone Payor to see when she might be in to restart aspirin as she is high risk for cardiovascular events.  PVD:  She has  no active claudication. No change in therapy is indicated.  HTN:  The blood pressure is at target. No change in medications is indicated. We will continue with therapeutic lifestyle changes (TLC).  DIASTOLIC HEART FAILURE:   She seems to be euvolemic.  At this point, no change in therapy is indicated.   HYPERLIPIDEMIA:  I reviewed labs and she's not  had a lipid profile in a while. I will have her come back for fasting lipid profile.  TOBACCO ABUSE:  She's not using the electronic cigarettes. I applauded her efforts to not smoke but caution on possibly becoming addicted to these.

## 2012-05-16 ENCOUNTER — Other Ambulatory Visit: Payer: Self-pay

## 2012-05-17 ENCOUNTER — Other Ambulatory Visit (INDEPENDENT_AMBULATORY_CARE_PROVIDER_SITE_OTHER): Payer: Self-pay

## 2012-05-17 ENCOUNTER — Encounter: Payer: Self-pay | Admitting: Physician Assistant

## 2012-05-17 DIAGNOSIS — E7800 Pure hypercholesterolemia, unspecified: Secondary | ICD-10-CM

## 2012-05-17 LAB — LIPID PANEL
Cholesterol: 216 mg/dL — ABNORMAL HIGH (ref 0–200)
Total CHOL/HDL Ratio: 5
Triglycerides: 133 mg/dL (ref 0.0–149.0)
VLDL: 26.6 mg/dL (ref 0.0–40.0)

## 2012-06-26 ENCOUNTER — Other Ambulatory Visit (HOSPITAL_BASED_OUTPATIENT_CLINIC_OR_DEPARTMENT_OTHER): Payer: No Typology Code available for payment source | Admitting: Lab

## 2012-06-26 DIAGNOSIS — Q2730 Arteriovenous malformation, site unspecified: Secondary | ICD-10-CM

## 2012-06-26 DIAGNOSIS — K922 Gastrointestinal hemorrhage, unspecified: Secondary | ICD-10-CM

## 2012-06-26 DIAGNOSIS — D509 Iron deficiency anemia, unspecified: Secondary | ICD-10-CM

## 2012-06-26 DIAGNOSIS — D500 Iron deficiency anemia secondary to blood loss (chronic): Secondary | ICD-10-CM

## 2012-06-26 LAB — CBC WITH DIFFERENTIAL/PLATELET
EOS%: 1.8 % (ref 0.0–7.0)
MCH: 29.1 pg (ref 25.1–34.0)
MCHC: 32.5 g/dL (ref 31.5–36.0)
MCV: 89.6 fL (ref 79.5–101.0)
MONO%: 6.2 % (ref 0.0–14.0)
RBC: 3.57 10*6/uL — ABNORMAL LOW (ref 3.70–5.45)
RDW: 16.6 % — ABNORMAL HIGH (ref 11.2–14.5)

## 2012-06-26 LAB — FERRITIN: Ferritin: 54 ng/mL (ref 10–291)

## 2012-08-01 ENCOUNTER — Telehealth: Payer: Self-pay | Admitting: *Deleted

## 2012-08-01 NOTE — Telephone Encounter (Signed)
Received call from pt stating that she thinks her counts are down & her daughter had called earlier.  Informed that I had not heard from her daughter but asked why she thought her counts were down.  She states Dr Verlan Friends had taken her off some meds & thought that might have caused some problems.  She states that lisinopril & fosamax were discontinued.  Informed that I didn't think these would effect her Hgb/Hct/ferritin.  When asked what kind of symptoms she had, she states she feels good although fatigued but this is not unusual for her.  She denied any other problems related to anemia.  Encouraged to keep her appt 08/21/12 for lab/MD & to call back if necessary.

## 2012-08-16 ENCOUNTER — Telehealth: Payer: Self-pay | Admitting: Oncology

## 2012-08-16 NOTE — Telephone Encounter (Signed)
Called pt , left message r/s from 7/1  due to MD call day per MD

## 2012-08-21 ENCOUNTER — Ambulatory Visit: Payer: Self-pay | Admitting: Oncology

## 2012-08-21 ENCOUNTER — Other Ambulatory Visit: Payer: Self-pay | Admitting: Lab

## 2012-09-04 ENCOUNTER — Ambulatory Visit: Payer: Self-pay | Admitting: Oncology

## 2012-09-04 ENCOUNTER — Other Ambulatory Visit: Payer: Self-pay | Admitting: Lab

## 2012-09-07 ENCOUNTER — Inpatient Hospital Stay (HOSPITAL_COMMUNITY)
Admission: EM | Admit: 2012-09-07 | Discharge: 2012-09-10 | DRG: 683 | Disposition: A | Discharge status: Home / Self Care | Payer: No Typology Code available for payment source | Attending: Family Medicine | Admitting: Family Medicine

## 2012-09-07 ENCOUNTER — Emergency Department (HOSPITAL_COMMUNITY): Payer: No Typology Code available for payment source

## 2012-09-07 ENCOUNTER — Encounter (HOSPITAL_COMMUNITY): Payer: Self-pay | Admitting: *Deleted

## 2012-09-07 DIAGNOSIS — I498 Other specified cardiac arrhythmias: Secondary | ICD-10-CM

## 2012-09-07 DIAGNOSIS — I701 Atherosclerosis of renal artery: Secondary | ICD-10-CM | POA: Diagnosis present

## 2012-09-07 DIAGNOSIS — I080 Rheumatic disorders of both mitral and aortic valves: Secondary | ICD-10-CM

## 2012-09-07 DIAGNOSIS — M949 Disorder of cartilage, unspecified: Secondary | ICD-10-CM | POA: Diagnosis present

## 2012-09-07 DIAGNOSIS — E875 Hyperkalemia: Secondary | ICD-10-CM

## 2012-09-07 DIAGNOSIS — E118 Type 2 diabetes mellitus with unspecified complications: Secondary | ICD-10-CM | POA: Diagnosis present

## 2012-09-07 DIAGNOSIS — E860 Dehydration: Secondary | ICD-10-CM | POA: Diagnosis present

## 2012-09-07 DIAGNOSIS — E1139 Type 2 diabetes mellitus with other diabetic ophthalmic complication: Secondary | ICD-10-CM | POA: Diagnosis present

## 2012-09-07 DIAGNOSIS — K922 Gastrointestinal hemorrhage, unspecified: Secondary | ICD-10-CM

## 2012-09-07 DIAGNOSIS — E119 Type 2 diabetes mellitus without complications: Secondary | ICD-10-CM

## 2012-09-07 DIAGNOSIS — I10 Essential (primary) hypertension: Secondary | ICD-10-CM

## 2012-09-07 DIAGNOSIS — D539 Nutritional anemia, unspecified: Secondary | ICD-10-CM

## 2012-09-07 DIAGNOSIS — I129 Hypertensive chronic kidney disease with stage 1 through stage 4 chronic kidney disease, or unspecified chronic kidney disease: Secondary | ICD-10-CM | POA: Diagnosis present

## 2012-09-07 DIAGNOSIS — N390 Urinary tract infection, site not specified: Secondary | ICD-10-CM | POA: Diagnosis present

## 2012-09-07 DIAGNOSIS — N189 Chronic kidney disease, unspecified: Secondary | ICD-10-CM | POA: Diagnosis present

## 2012-09-07 DIAGNOSIS — J4489 Other specified chronic obstructive pulmonary disease: Secondary | ICD-10-CM | POA: Diagnosis present

## 2012-09-07 DIAGNOSIS — I739 Peripheral vascular disease, unspecified: Secondary | ICD-10-CM | POA: Diagnosis present

## 2012-09-07 DIAGNOSIS — F329 Major depressive disorder, single episode, unspecified: Secondary | ICD-10-CM

## 2012-09-07 DIAGNOSIS — I5030 Unspecified diastolic (congestive) heart failure: Secondary | ICD-10-CM | POA: Diagnosis present

## 2012-09-07 DIAGNOSIS — E162 Hypoglycemia, unspecified: Secondary | ICD-10-CM

## 2012-09-07 DIAGNOSIS — E669 Obesity, unspecified: Secondary | ICD-10-CM

## 2012-09-07 DIAGNOSIS — I2510 Atherosclerotic heart disease of native coronary artery without angina pectoris: Secondary | ICD-10-CM | POA: Diagnosis present

## 2012-09-07 DIAGNOSIS — R197 Diarrhea, unspecified: Secondary | ICD-10-CM | POA: Diagnosis present

## 2012-09-07 DIAGNOSIS — K31819 Angiodysplasia of stomach and duodenum without bleeding: Secondary | ICD-10-CM

## 2012-09-07 DIAGNOSIS — E11319 Type 2 diabetes mellitus with unspecified diabetic retinopathy without macular edema: Secondary | ICD-10-CM | POA: Diagnosis present

## 2012-09-07 DIAGNOSIS — I5022 Chronic systolic (congestive) heart failure: Secondary | ICD-10-CM | POA: Diagnosis present

## 2012-09-07 DIAGNOSIS — F341 Dysthymic disorder: Secondary | ICD-10-CM

## 2012-09-07 DIAGNOSIS — Z79899 Other long term (current) drug therapy: Secondary | ICD-10-CM

## 2012-09-07 DIAGNOSIS — E785 Hyperlipidemia, unspecified: Secondary | ICD-10-CM | POA: Diagnosis present

## 2012-09-07 DIAGNOSIS — Z8719 Personal history of other diseases of the digestive system: Secondary | ICD-10-CM

## 2012-09-07 DIAGNOSIS — D509 Iron deficiency anemia, unspecified: Secondary | ICD-10-CM | POA: Diagnosis present

## 2012-09-07 DIAGNOSIS — I679 Cerebrovascular disease, unspecified: Secondary | ICD-10-CM | POA: Diagnosis present

## 2012-09-07 DIAGNOSIS — G459 Transient cerebral ischemic attack, unspecified: Secondary | ICD-10-CM

## 2012-09-07 DIAGNOSIS — F3289 Other specified depressive episodes: Secondary | ICD-10-CM | POA: Diagnosis present

## 2012-09-07 DIAGNOSIS — J449 Chronic obstructive pulmonary disease, unspecified: Secondary | ICD-10-CM | POA: Diagnosis present

## 2012-09-07 DIAGNOSIS — F17200 Nicotine dependence, unspecified, uncomplicated: Secondary | ICD-10-CM | POA: Diagnosis present

## 2012-09-07 DIAGNOSIS — Q2730 Arteriovenous malformation, site unspecified: Secondary | ICD-10-CM

## 2012-09-07 DIAGNOSIS — I509 Heart failure, unspecified: Secondary | ICD-10-CM | POA: Diagnosis present

## 2012-09-07 DIAGNOSIS — N179 Acute kidney failure, unspecified: Principal | ICD-10-CM | POA: Diagnosis present

## 2012-09-07 DIAGNOSIS — M899 Disorder of bone, unspecified: Secondary | ICD-10-CM | POA: Diagnosis present

## 2012-09-07 DIAGNOSIS — D62 Acute posthemorrhagic anemia: Secondary | ICD-10-CM

## 2012-09-07 DIAGNOSIS — K5520 Angiodysplasia of colon without hemorrhage: Secondary | ICD-10-CM | POA: Diagnosis present

## 2012-09-07 LAB — URINALYSIS, ROUTINE W REFLEX MICROSCOPIC
Bilirubin Urine: NEGATIVE
Ketones, ur: NEGATIVE mg/dL
Nitrite: NEGATIVE
Urobilinogen, UA: 0.2 mg/dL (ref 0.0–1.0)

## 2012-09-07 LAB — CBC WITH DIFFERENTIAL/PLATELET
Basophils Absolute: 0 10*3/uL (ref 0.0–0.1)
Eosinophils Relative: 2 % (ref 0–5)
Lymphocytes Relative: 9 % — ABNORMAL LOW (ref 12–46)
MCV: 89.7 fL (ref 78.0–100.0)
Neutro Abs: 8 10*3/uL — ABNORMAL HIGH (ref 1.7–7.7)
Platelets: 281 10*3/uL (ref 150–400)
RDW: 13.5 % (ref 11.5–15.5)
WBC: 9.6 10*3/uL (ref 4.0–10.5)

## 2012-09-07 MED ORDER — IOHEXOL 300 MG/ML  SOLN
50.0000 mL | Freq: Once | INTRAMUSCULAR | Status: AC | PRN
  Administered 2012-09-07: 50 mL via ORAL

## 2012-09-07 MED ORDER — IOHEXOL 300 MG/ML  SOLN: 50.0000 mL | Freq: Once | INTRAMUSCULAR | Status: AC | PRN

## 2012-09-07 NOTE — ED Provider Notes (Signed)
History    CSN: [id number] Arrival date & time 09/07/12  1749  First MD Initiated Contact with Patient 09/07/12 1913     Chief Complaint  Patient presents with  . Chest Pain  . Abdominal Pain   (Consider location/radiation/quality/duration/timing/severity/associated sxs/prior Treatment) The history is provided by the patient and the spouse. No language interpreter was used.  [name] is a 62 y/o F with PMHx of CVD, s/p CEA, COPD, Diastolic Heart Failure, RAS with left iliac stent placement, presenting to the ED with abdominal pain that has been ongoing for the past 15 years, patient reported that her stomach has been acting up since yesterday night. Patient reported that the abdominal pain feels like an "ulcer." Stated that she feels bloated, described as a constant aching pain mainly to the umbilical region. Patient stated that the pain radiates to the flanks, bilaterally. Stated that she has been having diarrhea, reported that she has had at least 10 bowel movements yesterday and at least 5-6 today. Patient reported that this is her normal presentation of abdominal pain - patient reported that she is followed by Dr. Marina Goodell, gastroenterologist. Patient reported that she has been experiencing chest discomfort, described as a burning sensation to the substernal region, without radiation, occurs at least 2 times per day lasting approximately 3 minutes. Stated that this discomfort occurs while she is at rest and when she is laying down she notices that she has this discomfort even more. Stated that she was taking ASA 81 mg. Stated that she has been having this discomfort for the past 7-8 months - stated that she is followed by [facility] - Dr. Peter Garter. Denied numbness, tingling, NSAID use, nausea, vomiting, decreased appetite, hematuria, dysuria, hematochezia, melena, fever, chills. Patient s/p appendectomy and cholecystectomy.  PCP Dr. Clyda Greener Cardio: Dr.  Peter Garter Gastroenterology: Dr. Marina Goodell Hematologist: Dr. Cheryll Cockayne  Past Medical History  Diagnosis Date  . Peripheral vascular disease     status post left common iliac and external iliac artery stents  . Cerebrovascular disease     Dopplers 3/14:  RICA 60-79% (high end of range -  Stable), LICA 60-79%, mod R subclavian stenosis, mod R prox CCA stenosis;  F/u 6 mos  . Diabetes mellitus   . COPD (chronic obstructive pulmonary disease)   . Hyperlipidemia   . Depression   . Diastolic congestive heart failure   . Hypertension   . Renal artery stenosis     status post  . CAD (coronary artery disease)     50% left main stenosis, ostial 60% LAD stenosis, 30% ramus intermediate stenosis, occluded right artery with left-to-right collaterals  . Iron deficiency anemia   . Diabetic retinopathy   . 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 Dieaulafoy's lesion 01/25/2012   Past Surgical History  Procedure Laterality Date  . Carotid endarterectomy    . Cholecystectomy    . Appendectomy    . Tonsillectomy    . Cesarean section    . Shoulder arthroscopy    . Iliac artery stent      left  . Enteroscopy  01/26/2012    Procedure: ENTEROSCOPY;  Surgeon: Iva Boop, MD;  Location: [facility];  Service: Endoscopy;  Laterality: N/A;   Family History  Problem Relation Age of Onset  . Hypertension Mother   . Heart attack Mother   . Hypertension Father   . Angina Father   .  Cancer Sister   . Cancer Brother    History  Substance Use Topics  . Smoking status: Former Smoker -- 0.25 packs/day for 36 years    Types: Cigarettes  . Smokeless tobacco: Never Used     Comment: uses e-cig  . Alcohol Use: No   OB History   Grav Para Term Preterm Abortions TAB SAB Ect Mult Living                 Review of Systems  Constitutional: Negative for fever and chills.  HENT: Negative for sore throat, trouble swallowing and  neck pain.   Eyes: Negative for pain.  Respiratory: Negative for chest tightness and shortness of breath.   Cardiovascular: Positive for chest pain.  Gastrointestinal: Positive for abdominal pain and diarrhea. Negative for nausea and vomiting.  Genitourinary: Positive for flank pain. Negative for dysuria, hematuria, decreased urine volume and difficulty urinating.  Neurological: Negative for dizziness, weakness and headaches.  All other systems reviewed and are negative.    Allergies  Review of patient's allergies indicates no known allergies.  Home Medications   Current Outpatient Rx  Name  Route  Sig  Dispense  Refill  . acetaminophen (TYLENOL) 500 MG tablet   Oral   Take 500 mg by mouth every 6 (six) hours as needed. For pain         . albuterol (PROVENTIL HFA;VENTOLIN HFA) 108 (90 BASE) MCG/ACT inhaler   Inhalation   Inhale 2 puffs into the lungs every 6 (six) hours as needed. For shortness of breath          . ALPRAZolam (XANAX) 0.25 MG tablet   Oral   Take 0.25 mg by mouth 2 (two) times daily.          [name] amLODipine (NORVASC) 10 MG tablet   Oral   Take 10 mg by mouth daily.          . calcium carbonate (OS-CAL) 600 MG TABS   Oral   Take 600 mg by mouth 3 (three) times daily with meals.          . cloNIDine (CATAPRES) 0.1 MG tablet   Oral   Take 0.1 mg by mouth 2 (two) times daily.         . Ferrous Sulfate (IRON) 325 (65 FE) MG TABS   Oral   Take 1 tablet by mouth 3 (three) times daily.          . fexofenadine (ALLEGRA) 180 MG tablet   Oral   Take 180 mg by mouth daily as needed. For allergies          . fish oil-omega-3 fatty acids 1000 MG capsule   Oral   Take 1 g by mouth 3 (three) times daily. Three times daily         . fluticasone (FLONASE) 50 MCG/ACT nasal spray   Nasal   Place 2 sprays into the nose daily as needed. For allergies          . furosemide (LASIX) 80 MG tablet   Oral   Take 0.5 tablets (40 mg total) by mouth  daily.   30 tablet   0   . metFORMIN (GLUCOPHAGE) 1000 MG tablet   Oral   Take 1,000 mg by mouth 2 (two) times daily with a meal.           . Multiple Vitamin (MULTIVITAMIN WITH MINERALS) TABS   Oral   Take 1 tablet by mouth daily.         [name]  pantoprazole (PROTONIX) 40 MG tablet   Oral   Take 1 tablet (40 mg total) by mouth daily.   30 tablet   3   . potassium chloride (K-DUR) 10 MEQ tablet   Oral   Take 10 mEq by mouth.           . rosuvastatin (CRESTOR) 40 MG tablet   Oral   Take 40 mg by mouth every evening.          . sertraline (ZOLOFT) 100 MG tablet   Oral   Take 200 mg by mouth daily.           BP 155/54  Pulse 66  Temp(Src) 98.3 F (36.8 C) (Oral)  Resp 20  SpO2 99% Physical Exam  Nursing note and vitals reviewed. Constitutional: She is oriented to person, place, and time. She appears well-developed and well-nourished. No distress.  HENT:  Head: Normocephalic and atraumatic.  Eyes: Conjunctivae and EOM are normal. Pupils are equal, round, and reactive to light. Right eye exhibits no discharge. Left eye exhibits no discharge.  Neck: Normal range of motion. Neck supple.  Cardiovascular: Normal rate, regular rhythm and normal heart sounds.  Exam reveals no friction rub.   No murmur heard. Pulmonary/Chest: Effort normal and breath sounds normal. No respiratory distress. She has no wheezes. She has no rales.  Abdominal: Soft. Bowel sounds are normal. She exhibits no distension. There is generalized tenderness. There is no rebound.  Generalized abdominal pain   Musculoskeletal: Normal range of motion.  Lymphadenopathy:    She has no cervical adenopathy.  Neurological: She is alert and oriented to person, place, and time. No cranial nerve deficit. She exhibits normal muscle tone. Coordination normal.  Skin: Skin is warm and dry. No rash noted. She is not diaphoretic. No erythema.  Psychiatric: She has a normal mood and affect. Her behavior is normal.  Thought content normal.    ED Course  Procedures (including critical care time)  Upon initial evaluation patient denied chest discomfort and reported that abdominal pain is 2/10.  10:30PM Discussed lab findings thus far. Patient denied chest and abdominal pain - denied medications.   12:38AM Discussed case with Dr. Toniann Fail - admit patient as inpatient to Telemetry, team 8.   Date: 09/07/2012  Rate: 64  Rhythm: normal sinus rhythm  QRS Axis: right  Intervals: PR shortened borderline  ST/T Wave abnormalities: nonspecific T wave changes  Conduction Disutrbances:none  Narrative Interpretation:   Old EKG Reviewed: unchanged    Labs Reviewed  CBC WITH DIFFERENTIAL - Abnormal; Notable for the following:    Hemoglobin 11.3 (*)    HCT 34.8 (*)    Neutrophils Relative % 84 (*)    Neutro Abs 8.0 (*)    Lymphocytes Relative 9 (*)    All other components within normal limits  URINALYSIS, ROUTINE W REFLEX MICROSCOPIC - Abnormal; Notable for the following:    APPearance CLOUDY (*)    Hgb urine dipstick SMALL (*)    Protein, ur 100 (*)    Leukocytes, UA SMALL (*)    All other components within normal limits  URINE MICROSCOPIC-ADD ON - Abnormal; Notable for the following:    Squamous Epithelial / LPF FEW (*)    Bacteria, UA FEW (*)    All other components within normal limits  URINE CULTURE  COMPREHENSIVE METABOLIC PANEL  LIPASE, BLOOD  CBC WITH DIFFERENTIAL  COMPREHENSIVE METABOLIC PANEL   Ct Abdomen Pelvis Wo Contrast  09/07/2012   *RADIOLOGY REPORT*  Clinical Data: Abdominal pain  CT ABDOMEN AND PELVIS WITHOUT CONTRAST  Technique:  Multidetector CT imaging of the abdomen and pelvis was performed following the standard protocol without intravenous contrast.  Comparison: 11/03/2011  Findings: Minimal patchy dependent bibasilar atelectasis.  No pleural effusion. Cholecystectomy clips noted.  Unenhanced liver, adrenal glands, kidneys, spleen, and pancreas are normal.  Minimal  nonspecific bilateral perinephric stranding noted without hydronephrosis or radiopaque renal or ureteral calculus.  Moderate atheromatous aortic calcification noted without aneurysm. Uterus and ovaries are normal.  No bowel wall thickening or focal segmental dilatation.  The appendix is not identified but there is no secondary evidence for acute appendicitis. Bones are osteopenic, which may mask subtle fracture.  No compression deformity is identified.  Schmorl's node formation incidentally noted. Concentric disc bulge is re-identified L4-L5 and L5-S1.  IMPRESSION: No acute intra-abdominal or pelvic pathology.   Original Report Authenticated By: Christiana Pellant, M.D.   Dg Chest 2 View  09/07/2012   *RADIOLOGY REPORT*  Clinical Data: Upper abdominal pain and chest tightness.  Nausea and diarrhea.  CHEST - 2 VIEW  Comparison: Chest radiograph performed 02/08/2012  Findings: The lungs are well-aerated.  Chronically increased interstitial markings and peribronchial thickening are seen. Previously noted vascular congestion has improved.  There is no evidence of focal opacification, pleural effusion or pneumothorax.  The heart is mildly enlarged.  No acute osseous abnormalities are seen.  Clips are noted within the right upper quadrant, reflecting prior cholecystectomy.  IMPRESSION: Mild chronic lung changes noted; no acute cardiopulmonary process seen.  Mild cardiomegaly noted.   Original Report Authenticated By: Tonia Ghent, M.D.   1. Acute renal failure   2. RAS (renal artery stenosis)   3. CVD (cardiovascular disease)   4. COPD (chronic obstructive pulmonary disease)   5. Diastolic heart failure, unspecified failure chronicity     MDM  Patient presenting to the ED with abdominal pain and chest pain. Negative acute abdomen, negative peritoneal signs noted. Patient comfortable. Generalized tenderness upon deep palpation - soft, negative distension noted to the abdomen. When asked if patient wanted pain  medications she denied each time reporting that she was okay.  EKG negative for ischemic changes. First set of troponins negative elevation (0.02). CMP elevated Creatinine 4.82 - renal disorder noted - noticed increase from 2013 when Cr 0.96. Lipase negative elevation. UA mild Hgb noted - small leukocytes noted. CBC negative findings. Second set of troponins- negative elevation. Chest xray negative acute findings. CT scan negative for kidney stones, negative bowel wall thickening - negative acute findings noted - mild atelectasis noted to bibasilar region.  Patient has chronic abdominal pain - is being followed by gastroenterology. Chest pain suspicion to be GERD-like in nature - EKG and troponins negative for acute infarction or cardiac injury. Patient currently in acute renal failure with elevated Creatinine and BUN since 01/26/2012 - discussed case with Dr. Nigel Berthold. [place] - assessed patient and agreed for admission. Discussed case with Dr. Toniann Fail - admit patient for inpatient to Telemetry, Team 8.         Raymon Mutton, PA-C 09/08/12 0207

## 2012-09-07 NOTE — ED Notes (Signed)
Patient reports medial upper abdominal pain 10/10 and medial chest tightness 7/10 starting yesterday accompanied by nausea, diarrhea. Denies SOB, blurred vision, dizziness.

## 2012-09-07 NOTE — ED Notes (Addendum)
Pt reports abdominal pain x1 week. Since yesterday pain 10/10. Hx of multiple comorbidities and abdominal issues.reports dysuria. Denies vomiting.

## 2012-09-08 ENCOUNTER — Inpatient Hospital Stay (HOSPITAL_COMMUNITY): Payer: No Typology Code available for payment source

## 2012-09-08 ENCOUNTER — Encounter (HOSPITAL_COMMUNITY): Payer: Self-pay | Admitting: Internal Medicine

## 2012-09-08 DIAGNOSIS — N179 Acute kidney failure, unspecified: Secondary | ICD-10-CM

## 2012-09-08 DIAGNOSIS — J449 Chronic obstructive pulmonary disease, unspecified: Secondary | ICD-10-CM

## 2012-09-08 DIAGNOSIS — R197 Diarrhea, unspecified: Secondary | ICD-10-CM

## 2012-09-08 DIAGNOSIS — J4489 Other specified chronic obstructive pulmonary disease: Secondary | ICD-10-CM

## 2012-09-08 DIAGNOSIS — E119 Type 2 diabetes mellitus without complications: Secondary | ICD-10-CM

## 2012-09-08 LAB — URINALYSIS, ROUTINE W REFLEX MICROSCOPIC
Bilirubin Urine: NEGATIVE
Glucose, UA: NEGATIVE mg/dL
Specific Gravity, Urine: 1.015 (ref 1.005–1.030)

## 2012-09-08 LAB — CBC WITH DIFFERENTIAL/PLATELET
Basophils Absolute: 0 10*3/uL (ref 0.0–0.1)
Basophils Relative: 1 % (ref 0–1)
Eosinophils Absolute: 0.2 10*3/uL (ref 0.0–0.7)
Eosinophils Relative: 3 % (ref 0–5)
Eosinophils Relative: 3 % (ref 0–5)
HCT: 36.1 % (ref 36.0–46.0)
Hemoglobin: 11.6 g/dL — ABNORMAL LOW (ref 12.0–15.0)
Lymphocytes Relative: 13 % (ref 12–46)
Lymphs Abs: 0.9 10*3/uL (ref 0.7–4.0)
MCH: 29 pg (ref 26.0–34.0)
MCHC: 32.1 g/dL (ref 30.0–36.0)
MCV: 90.3 fL (ref 78.0–100.0)
MCV: 90.3 fL (ref 78.0–100.0)
Monocytes Absolute: 0.6 10*3/uL (ref 0.1–1.0)
Monocytes Relative: 8 % (ref 3–12)
Neutro Abs: 5.8 10*3/uL (ref 1.7–7.7)
Neutrophils Relative %: 76 % (ref 43–77)
Neutrophils Relative %: 78 % — ABNORMAL HIGH (ref 43–77)
Platelets: 280 10*3/uL (ref 150–400)
RBC: 4 MIL/uL (ref 3.87–5.11)
RDW: 13.3 % (ref 11.5–15.5)
WBC: 7.5 10*3/uL (ref 4.0–10.5)

## 2012-09-08 LAB — COMPREHENSIVE METABOLIC PANEL
ALT: 7 U/L (ref 0–35)
ALT: 6 U/L (ref 0–35)
AST: 15 U/L (ref 0–37)
AST: 13 U/L (ref 0–37)
Albumin: 2.7 g/dL — ABNORMAL LOW (ref 3.5–5.2)
CO2: 30 mEq/L (ref 19–32)
CO2: 31 mEq/L (ref 19–32)
Calcium: 11 mg/dL — ABNORMAL HIGH (ref 8.4–10.5)
Calcium: 10.2 mg/dL (ref 8.4–10.5)
Creatinine, Ser: 4.86 mg/dL — ABNORMAL HIGH (ref 0.50–1.10)
GFR calc non Af Amer: 9 mL/min — ABNORMAL LOW (ref >90)
GFR calc non Af Amer: 9 mL/min — ABNORMAL LOW (ref >90)
Sodium: 135 mEq/L (ref 135–145)
Sodium: 134 mEq/L — ABNORMAL LOW (ref 135–145)

## 2012-09-08 LAB — IRON AND TIBC
Saturation Ratios: 25 % (ref 20–55)
TIBC: 308 ug/dL (ref 250–470)

## 2012-09-08 LAB — TROPONIN I
Troponin I: <0.3 ng/mL (ref <0.30)
Troponin I: <0.3 ng/mL (ref <0.30)

## 2012-09-08 LAB — CLOSTRIDIUM DIFFICILE BY PCR: Toxigenic C. Difficile by PCR: NEGATIVE

## 2012-09-08 LAB — CREATININE, URINE, RANDOM: Creatinine, Urine: 41.5 mg/dL

## 2012-09-08 LAB — URINE MICROSCOPIC-ADD ON

## 2012-09-08 LAB — POCT I-STAT TROPONIN I
Troponin i, poc: 0.02 ng/mL (ref 0.00–0.08)
Troponin i, poc: 0.02 ng/mL (ref 0.00–0.08)

## 2012-09-08 LAB — GLUCOSE, CAPILLARY
Glucose-Capillary: 114 mg/dL — ABNORMAL HIGH (ref 70–99)
Glucose-Capillary: 128 mg/dL — ABNORMAL HIGH (ref 70–99)

## 2012-09-08 LAB — LACTIC ACID, PLASMA: Lactic Acid, Venous: 0.8 mmol/L (ref 0.5–2.2)

## 2012-09-08 LAB — HEPATITIS B CORE ANTIBODY, IGM: Hep B C IgM: NEGATIVE

## 2012-09-08 MED ORDER — ACETAMINOPHEN 325 MG PO TABS
650.0000 mg | ORAL_TABLET | Freq: Four times a day (QID) | ORAL | Status: DC | PRN
  Administered 2012-09-08: 650 mg via ORAL
  Filled 2012-09-08: qty 2

## 2012-09-08 MED ORDER — ONDANSETRON HCL 4 MG/2ML IJ SOLN: 4.0000 mg | Freq: Four times a day (QID) | INTRAMUSCULAR | Status: DC | PRN

## 2012-09-08 MED ORDER — PANTOPRAZOLE SODIUM 40 MG PO TBEC
40.0000 mg | DELAYED_RELEASE_TABLET | Freq: Every day | ORAL | Status: DC
  Administered 2012-09-08 – 2012-09-10 (×3): 40 mg via ORAL
  Filled 2012-09-08 (×3): qty 1

## 2012-09-08 MED ORDER — LORATADINE 10 MG PO TABS
10.0000 mg | ORAL_TABLET | Freq: Every day | ORAL | Status: DC
  Administered 2012-09-08 – 2012-09-10 (×3): 10 mg via ORAL
  Filled 2012-09-08 (×3): qty 1

## 2012-09-08 MED ORDER — FERROUS SULFATE 325 (65 FE) MG PO TABS
325.0000 mg | ORAL_TABLET | Freq: Three times a day (TID) | ORAL | Status: DC
  Administered 2012-09-08 – 2012-09-10 (×7): 325 mg via ORAL
  Filled 2012-09-08 (×10): qty 1

## 2012-09-08 MED ORDER — ONDANSETRON HCL 4 MG PO TABS: 4.0000 mg | ORAL_TABLET | Freq: Four times a day (QID) | ORAL | Status: DC | PRN

## 2012-09-08 MED ORDER — SERTRALINE HCL 100 MG PO TABS
200.0000 mg | ORAL_TABLET | Freq: Every day | ORAL | Status: DC
  Administered 2012-09-08 – 2012-09-10 (×3): 200 mg via ORAL
  Filled 2012-09-08 (×3): qty 2

## 2012-09-08 MED ORDER — FLUTICASONE PROPIONATE 50 MCG/ACT NA SUSP
2.0000 | Freq: Every day | NASAL | Status: DC | PRN
  Filled 2012-09-08: qty 16

## 2012-09-08 MED ORDER — INSULIN ASPART 100 UNIT/ML ~~LOC~~ SOLN: 0.0000 [IU] | Freq: Three times a day (TID) | SUBCUTANEOUS | Status: DC

## 2012-09-08 MED ORDER — OMEGA-3 FATTY ACIDS 1000 MG PO CAPS: 1.0000 g | ORAL_CAPSULE | Freq: Three times a day (TID) | ORAL | Status: DC

## 2012-09-08 MED ORDER — CIPROFLOXACIN IN D5W 400 MG/200ML IV SOLN: 400.0000 mg | Freq: Two times a day (BID) | INTRAVENOUS | Status: DC

## 2012-09-08 MED ORDER — AMLODIPINE BESYLATE 10 MG PO TABS
10.0000 mg | ORAL_TABLET | Freq: Every day | ORAL | Status: DC
  Administered 2012-09-08 – 2012-09-10 (×3): 10 mg via ORAL
  Filled 2012-09-08 (×3): qty 1

## 2012-09-08 MED ORDER — ATORVASTATIN CALCIUM 20 MG PO TABS
20.0000 mg | ORAL_TABLET | Freq: Every day | ORAL | Status: DC
  Administered 2012-09-08 – 2012-09-09 (×2): 20 mg via ORAL
  Filled 2012-09-08 (×3): qty 1

## 2012-09-08 MED ORDER — ACETAMINOPHEN 650 MG RE SUPP: 650.0000 mg | Freq: Four times a day (QID) | RECTAL | Status: DC | PRN

## 2012-09-08 MED ORDER — CIPROFLOXACIN IN D5W 400 MG/200ML IV SOLN
400.0000 mg | Freq: Once | INTRAVENOUS | Status: AC
  Administered 2012-09-08: 400 mg via INTRAVENOUS
  Filled 2012-09-08 (×2): qty 200

## 2012-09-08 MED ORDER — OMEGA-3-ACID ETHYL ESTERS 1 G PO CAPS
1.0000 g | ORAL_CAPSULE | Freq: Three times a day (TID) | ORAL | Status: DC
  Administered 2012-09-08 – 2012-09-10 (×7): 1 g via ORAL
  Filled 2012-09-08 (×10): qty 1

## 2012-09-08 MED ORDER — CLONIDINE HCL 0.1 MG PO TABS
0.1000 mg | ORAL_TABLET | Freq: Two times a day (BID) | ORAL | Status: DC
  Administered 2012-09-08 – 2012-09-10 (×5): 0.1 mg via ORAL
  Filled 2012-09-08 (×6): qty 1

## 2012-09-08 MED ORDER — CIPROFLOXACIN IN D5W 400 MG/200ML IV SOLN
400.0000 mg | INTRAVENOUS | Status: DC
  Administered 2012-09-09 – 2012-09-10 (×2): 400 mg via INTRAVENOUS
  Filled 2012-09-08 (×2): qty 200

## 2012-09-08 MED ORDER — SODIUM CHLORIDE 0.9 % IV SOLN: INTRAVENOUS | Status: DC

## 2012-09-08 MED ORDER — SODIUM CHLORIDE 0.9 % IV BOLUS (SEPSIS)
500.0000 mL | Freq: Once | INTRAVENOUS | Status: AC
  Administered 2012-09-08: 500 mL via INTRAVENOUS

## 2012-09-08 MED ORDER — ALBUTEROL SULFATE HFA 108 (90 BASE) MCG/ACT IN AERS
2.0000 | INHALATION_SPRAY | Freq: Four times a day (QID) | RESPIRATORY_TRACT | Status: DC | PRN
  Filled 2012-09-08: qty 6.7

## 2012-09-08 MED ORDER — SODIUM CHLORIDE 0.9 % IJ SOLN
3.0000 mL | Freq: Two times a day (BID) | INTRAMUSCULAR | Status: DC
  Administered 2012-09-08 – 2012-09-09 (×4): 3 mL via INTRAVENOUS

## 2012-09-08 MED ORDER — CALCIUM CARBONATE 1250 (500 CA) MG PO TABS
1250.0000 mg | ORAL_TABLET | Freq: Three times a day (TID) | ORAL | Status: DC
  Administered 2012-09-08 – 2012-09-10 (×7): 1250 mg via ORAL
  Filled 2012-09-08 (×10): qty 1

## 2012-09-08 MED ORDER — ALPRAZOLAM 0.25 MG PO TABS
0.2500 mg | ORAL_TABLET | Freq: Two times a day (BID) | ORAL | Status: DC
  Administered 2012-09-08 – 2012-09-10 (×5): 0.25 mg via ORAL
  Filled 2012-09-08 (×5): qty 1

## 2012-09-08 MED ORDER — INSULIN ASPART 100 UNIT/ML ~~LOC~~ SOLN
0.0000 [IU] | Freq: Three times a day (TID) | SUBCUTANEOUS | Status: DC
  Administered 2012-09-08 – 2012-09-09 (×2): 1 [IU] via SUBCUTANEOUS
  Administered 2012-09-09: 3 [IU] via SUBCUTANEOUS

## 2012-09-08 NOTE — Progress Notes (Signed)
ANTIBIOTIC CONSULT NOTE - INITIAL  Pharmacy Consult for Cipro Indication: UTI  No Known Allergies  Patient Measurements: Height: 4\' 9"  (144.8 cm) Weight: 142 lb 10.2 oz (64.7 kg) IBW/kg (Calculated) : 38.6   Vital Signs: Temp: 98.4 F (36.9 C) (07/19 0355) Temp src: Oral (07/19 0355) BP: 154/43 mmHg (07/19 0355) Pulse Rate: 65 (07/19 0355) Intake/Output from previous day:   Intake/Output from this shift:    Labs:  Recent Labs  09/07/12 1930  WBC 9.6  HGB 11.3*  PLT 281   Estimated Creatinine Clearance: 47 ml/min (by C-G formula based on Cr of 0.96). No results found for this basename: VANCOTROUGH, VANCOPEAK, VANCORANDOM, GENTTROUGH, GENTPEAK, GENTRANDOM, TOBRATROUGH, TOBRAPEAK, TOBRARND, AMIKACINPEAK, AMIKACINTROU, AMIKACIN,  in the last 72 hours   Microbiology: No results found for this or any previous visit (from the past 720 hour(s)).  Medical History: Past Medical History  Diagnosis Date  . Peripheral vascular disease     status post left common iliac and external iliac artery stents  . Cerebrovascular disease     Dopplers 3/14:  RICA 60-79% (high end of range -  Stable), LICA 60-79%, mod R subclavian stenosis, mod R prox CCA stenosis;  F/u 6 mos  . Diabetes mellitus   . COPD (chronic obstructive pulmonary disease)   . Hyperlipidemia   . Depression   . Diastolic congestive heart failure   . Hypertension   . Renal artery stenosis     status post  . CAD (coronary artery disease)     50% left main stenosis, ostial 60% LAD stenosis, 30% ramus intermediate stenosis, occluded right artery with left-to-right collaterals  . Iron deficiency anemia   . Diabetic retinopathy   . 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 Dieaulafoy's lesion 01/25/2012    Medications:  Scheduled:  . ALPRAZolam  0.25 mg Oral BID  . amLODipine  10 mg Oral Daily  . atorvastatin  20 mg Oral q1800  .  calcium carbonate  1,250 mg Oral TID WC  . ciprofloxacin  400 mg Intravenous Once  . cloNIDine  0.1 mg Oral BID  . ferrous sulfate  325 mg Oral TID PC  . insulin aspart  0-9 Units Subcutaneous TID WC  . loratadine  10 mg Oral Daily  . omega-3 acid ethyl esters  1 g Oral TID  . pantoprazole  40 mg Oral Daily  . sertraline  200 mg Oral Daily  . sodium chloride  3 mL Intravenous Q12H   Infusions:  . sodium chloride     PRN: acetaminophen, acetaminophen, albuterol, fluticasone, ondansetron (ZOFRAN) IV, ondansetron  Assessment: 62 y/o F with diarrhea, dehydration, possible UTI - to begin empiric Cipro  Goal of Therapy:  Adjust Cipro dosage for renal function Eradication of infection  Plan:  1. Cipro 400mg  IV q12h 2. Follow serum creatinine. 3. Await urine culture results.  Elie Goody, PharmD, BCPS Pager: [phone] 09/08/2012  4:32 AM

## 2012-09-08 NOTE — Progress Notes (Signed)
ANTIBIOTIC CONSULT NOTE - INITIAL  Pharmacy Consult for Cipro Indication: UTI  No Known Allergies  Patient Measurements: Height: 4\' 9"  (144.8 cm) Weight: 142 lb 10.2 oz (64.7 kg) IBW/kg (Calculated) : 38.6   Vital Signs: Temp: 98.4 F (36.9 C) (07/19 0355) Temp src: Oral (07/19 0355) BP: 154/43 mmHg (07/19 0355) Pulse Rate: 65 (07/19 0355) Intake/Output from previous day:   Intake/Output from this shift:    Labs:  Recent Labs  09/07/12 1930  WBC 9.6  HGB 11.3*  PLT 281   SCr ordered in ED 7/18 but lab was unable to access a result.  Has another chemistry ordered for this morning.    Microbiology: No results found for this or any previous visit (from the past 720 hour(s)).  Medical History: Past Medical History  Diagnosis Date  . Peripheral vascular disease     status post left common iliac and external iliac artery stents  . Cerebrovascular disease     Dopplers 3/14:  RICA 60-79% (high end of range -  Stable), LICA 60-79%, mod R subclavian stenosis, mod R prox CCA stenosis;  F/u 6 mos  . Diabetes mellitus   . COPD (chronic obstructive pulmonary disease)   . Hyperlipidemia   . Depression   . Diastolic congestive heart failure   . Hypertension   . Renal artery stenosis     status post  . CAD (coronary artery disease)     50% left main stenosis, ostial 60% LAD stenosis, 30% ramus intermediate stenosis, occluded right artery with left-to-right collaterals  . Iron deficiency anemia   . Diabetic retinopathy   . 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 Dieaulafoy's lesion 01/25/2012    Medications:  Scheduled:  . ALPRAZolam  0.25 mg Oral BID  . amLODipine  10 mg Oral Daily  . atorvastatin  20 mg Oral q1800  . calcium carbonate  1,250 mg Oral TID WC  . ciprofloxacin  400 mg Intravenous Once  . cloNIDine  0.1 mg Oral BID  . ferrous sulfate  325 mg Oral TID PC  . insulin aspart   0-9 Units Subcutaneous TID WC  . loratadine  10 mg Oral Daily  . omega-3 acid ethyl esters  1 g Oral TID  . pantoprazole  40 mg Oral Daily  . sertraline  200 mg Oral Daily  . sodium chloride  3 mL Intravenous Q12H   Infusions:  . sodium chloride     PRN: acetaminophen, acetaminophen, albuterol, fluticasone, ondansetron (ZOFRAN) IV, ondansetron  Assessment: 62 y/o F with diarrhea, dehydration, possible UTI - to begin empiric Cipro  Goal of Therapy:  Adjust Cipro dosage for renal function Eradication of infection  Plan:  1. Cipro 400mg  IV x 1 2. Await serum creatinine result, then determine maintenance dosage. 3. Await urine culture results.  Elie Goody, PharmD, BCPS Pager: [phone] 09/08/2012  4:37 AM

## 2012-09-08 NOTE — ED Notes (Signed)
Called floor to give report on pt. No answer.

## 2012-09-08 NOTE — Progress Notes (Signed)
Subjective: Patiet seen and examined, admitted with AKI, diarrhea. Denies diarrhea at this time. Filed Vitals:   09/08/12 0355  BP: 154/43  Pulse: 65  Temp: 98.4 F (36.9 C)  Resp:     Chest: Clear Bilaterally Heart : S1S2 RRR Abdomen: Soft, nontender Ext : No edema Neuro: Alert, oriented x 3  A/P AKI- ? prerenal, due to diarrhea continue with IV fluids. Will also obtain nephrology consult Diarrhea- Check stool for C diff. ?UTI- UA is abnormal, will continue with Cipro till the culture results. Diabetes Mellitus- CBG is stable, Sliding scale insulin. COPD- Stable, continue inhalers. Hypertension-continue catapres. DVT prophylaxis- SCD  Meredeth Ide [facility] [phone]

## 2012-09-08 NOTE — H&P (Addendum)
[facility] History and Physical  IMOGENE GRAVELLE ZOX:[id number] DOB: [birth year] DOA: 09/07/2012  Referring physician: ER physician. PCP: Burtis Junes, MD  Specialists: [facility] cardiology.  Chief Complaint: Abdominal pain and chest pain.  HPI: [name] is a 62 y.o. female with known history of COPD, diabetes mellitus2, hypertension, diastolic CHF, renal artery stents and left iliac stent and previous history of GI bleed presented to the ER because of abdominal pain and some chest discomfort. Patient states she has been having abdominal pain mostly periumbilical for the last 3 days with diarrhea over the last 4 days. The pain has been colicky in nature denies any associated nausea vomiting or fever chills. Patient denies having used any recent antibiotics hospitalization. Denies having used NSAIDs or any new medications. In addition patient has been getting off and on chest discomfort which is retrosternal for last 2-3 days. The pain is pressure-like nonradiating with no associated shortness of breath. Denies any productive cough or fever chills. In the ER patient had a CT abdomen and pelvis without contrast which showed nothing acute. Cardiac markers are negative. Patient's labs show elevated creatinine around 4 from a baseline of around 1.3 last December. Patient has been admitted for further management.  Review of Systems: As presented in the history of presenting illness, rest negative.  Past Medical History  Diagnosis Date  . Peripheral vascular disease     status post left common iliac and external iliac artery stents  . Cerebrovascular disease     Dopplers 3/14:  RICA 60-79% (high end of range -  Stable), LICA 60-79%, mod R subclavian stenosis, mod R prox CCA stenosis;  F/u 6 mos  . Diabetes mellitus   . COPD (chronic obstructive pulmonary disease)   . Hyperlipidemia   . Depression   . Diastolic congestive heart failure   . Hypertension   . Renal artery stenosis      status post  . CAD (coronary artery disease)     50% left main stenosis, ostial 60% LAD stenosis, 30% ramus intermediate stenosis, occluded right artery with left-to-right collaterals  . Iron deficiency anemia   . Diabetic retinopathy   . 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 Dieaulafoy's lesion 01/25/2012   Past Surgical History  Procedure Laterality Date  . Carotid endarterectomy    . Cholecystectomy    . Appendectomy    . Tonsillectomy    . Cesarean section    . Shoulder arthroscopy    . Iliac artery stent      left  . Enteroscopy  01/26/2012    Procedure: ENTEROSCOPY;  Surgeon: Iva Boop, MD;  Location: [facility] END[facility];  Service: Endoscopy;  Laterality: N/A;   Social History:  reports that she has quit smoking. Her smoking use included Cigarettes. She has a 9 pack-year smoking history. She has never used smokeless tobacco. She reports that she does not drink alcohol or use illicit drugs. Home. where does patient live-- Can do ADLs. Can patient participate in ADLs?  No Known Allergies  Family History  Problem Relation Age of Onset  . Hypertension Mother   . Heart attack Mother   . Hypertension Father   . Angina Father   . Cancer Sister   . Cancer Brother       Prior to Admission medications   Medication Sig Start Date End Date Taking? Authorizing Provider  acetaminophen (TYLENOL) 500 MG tablet Take 500  mg by mouth every 6 (six) hours as needed. For pain   Yes Historical Provider, MD  albuterol (PROVENTIL HFA;VENTOLIN HFA) 108 (90 BASE) MCG/ACT inhaler Inhale 2 puffs into the lungs every 6 (six) hours as needed. For shortness of breath    Yes Historical Provider, MD  ALPRAZolam (XANAX) 0.25 MG tablet Take 0.25 mg by mouth 2 (two) times daily.    Yes Historical Provider, MD  amLODipine (NORVASC) 10 MG tablet Take 10 mg by mouth daily.    Yes Historical Provider, MD  calcium carbonate (OS-CAL)  600 MG TABS Take 600 mg by mouth 3 (three) times daily with meals.    Yes Historical Provider, MD  cloNIDine (CATAPRES) 0.1 MG tablet Take 0.1 mg by mouth 2 (two) times daily.   Yes Historical Provider, MD  Ferrous Sulfate (IRON) 325 (65 FE) MG TABS Take 1 tablet by mouth 3 (three) times daily.    Yes Historical Provider, MD  fexofenadine (ALLEGRA) 180 MG tablet Take 180 mg by mouth daily as needed. For allergies    Yes Historical Provider, MD  fish oil-omega-3 fatty acids 1000 MG capsule Take 1 g by mouth 3 (three) times daily. Three times daily   Yes Historical Provider, MD  fluticasone (FLONASE) 50 MCG/ACT nasal spray Place 2 sprays into the nose daily as needed. For allergies    Yes Historical Provider, MD  furosemide (LASIX) 80 MG tablet Take 0.5 tablets (40 mg total) by mouth daily. 11/05/11  Yes Richarda Overlie, MD  metFORMIN (GLUCOPHAGE) 1000 MG tablet Take 1,000 mg by mouth 2 (two) times daily with a meal.     Yes Historical Provider, MD  Multiple Vitamin (MULTIVITAMIN WITH MINERALS) TABS Take 1 tablet by mouth daily.   Yes Historical Provider, MD  pantoprazole (PROTONIX) 40 MG tablet Take 1 tablet (40 mg total) by mouth daily. 01/28/12  Yes Ripudeep Jenna Luo, MD  potassium chloride (K-DUR) 10 MEQ tablet Take 10 mEq by mouth.     Yes Historical Provider, MD  rosuvastatin (CRESTOR) 40 MG tablet Take 40 mg by mouth every evening.    Yes Historical Provider, MD  sertraline (ZOLOFT) 100 MG tablet Take 200 mg by mouth daily.    Yes Historical Provider, MD   Physical Exam: Filed Vitals:   09/07/12 1810 09/07/12 2019 09/08/12 0039  BP: 159/56 156/70 155/54  Pulse: 72 64 66  Temp: 98.6 F (37 C)  98.3 F (36.8 C)  TempSrc: Oral  Oral  Resp: 16 12 20   SpO2: 98% 95% 99%     General:  Well-developed and nourished.  Eyes: Anicteric no pallor.  ENT: No discharge from ears eyes nose mouth.  Neck: No mass felt.  Cardiovascular: S1-S2 heard.  Respiratory: No rhonchi or  crepitations.  Abdomen: Soft nontender bowel sounds present.  Skin: No rash.  Musculoskeletal: No edema.  Psychiatric: Appears normal.  Neurologic: Alert awake oriented to time place and person. Moves all extremities.  Labs on Admission:  Basic Metabolic Panel: No results found for this basename: NA, K, CL, CO2, GLUCOSE, BUN, CREATININE, CALCIUM, MG, PHOS,  in the last 168 hours Liver Function Tests: No results found for this basename: AST, ALT, ALKPHOS, BILITOT, PROT, ALBUMIN,  in the last 168 hours No results found for this basename: LIPASE, AMYLASE,  in the last 168 hours No results found for this basename: AMMONIA,  in the last 168 hours CBC:  Recent Labs Lab 09/07/12 1930  WBC 9.6  NEUTROABS 8.0*  HGB 11.3*  HCT 34.8*  MCV 89.7  PLT 281   Cardiac Enzymes: No results found for this basename: CKTOTAL, CKMB, CKMBINDEX, TROPONINI,  in the last 168 hours  BNP (last 3 results)  Recent Labs  10/29/11 1002 11/03/11 2017  PROBNP 1049.0* 922.7*   CBG: No results found for this basename: GLUCAP,  in the last 168 hours  Radiological Exams on Admission: Ct Abdomen Pelvis Wo Contrast  09/07/2012   *RADIOLOGY REPORT*  Clinical Data: Abdominal pain  CT ABDOMEN AND PELVIS WITHOUT CONTRAST  Technique:  Multidetector CT imaging of the abdomen and pelvis was performed following the standard protocol without intravenous contrast.  Comparison: 11/03/2011  Findings: Minimal patchy dependent bibasilar atelectasis.  No pleural effusion. Cholecystectomy clips noted.  Unenhanced liver, adrenal glands, kidneys, spleen, and pancreas are normal.  Minimal nonspecific bilateral perinephric stranding noted without hydronephrosis or radiopaque renal or ureteral calculus.  Moderate atheromatous aortic calcification noted without aneurysm. Uterus and ovaries are normal.  No bowel wall thickening or focal segmental dilatation.  The appendix is not identified but there is no secondary evidence for  acute appendicitis. Bones are osteopenic, which may mask subtle fracture.  No compression deformity is identified.  Schmorl's node formation incidentally noted. Concentric disc bulge is re-identified L4-L5 and L5-S1.  IMPRESSION: No acute intra-abdominal or pelvic pathology.   Original Report Authenticated By: Christiana Pellant, M.D.   Dg Chest 2 View  09/07/2012   *RADIOLOGY REPORT*  Clinical Data: Upper abdominal pain and chest tightness.  Nausea and diarrhea.  CHEST - 2 VIEW  Comparison: Chest radiograph performed 02/08/2012  Findings: The lungs are well-aerated.  Chronically increased interstitial markings and peribronchial thickening are seen. Previously noted vascular congestion has improved.  There is no evidence of focal opacification, pleural effusion or pneumothorax.  The heart is mildly enlarged.  No acute osseous abnormalities are seen.  Clips are noted within the right upper quadrant, reflecting prior cholecystectomy.  IMPRESSION: Mild chronic lung changes noted; no acute cardiopulmonary process seen.  Mild cardiomegaly noted.   Original Report Authenticated By: Tonia Ghent, M.D.    EKG: Independently reviewed. Normal sinus rhythm with ST-T changes comparable with old EKG.  Assessment/Plan Principal Problem:   Acute renal failure Active Problems:   DIABETES MELLITUS, TYPE II   CHF   COPD   Diarrhea   1. Acute renal failure - most likely related to dehydration secondary to diarrhea with patient taking Lasix. At this time 500 cc normal saline bolus has been ordered and we will continue IV fluids at 125 cc per hour and close followup on her respiratory status given patient's history of CHF. Hold off Lasix for now. Hold metformin due to renal failure. CT abdomen does not show any hydronephrosis. Check urine sodium and creatinine. Closely follow intake output and metabolic panel. 2. Chest pain - Myoview done last July 2013 was negative for ischemia. Cycle cardiac markers. 3. Diarrhea -  check stool for C. difficile and cultures. Check lactic acid levels. Gently hydrate. 4. Possible UTI - check urine cultures. Cipro until cultures are available. 5. History of CHF last EF measured in 2013 was 55-60% - due to renal failure we are holding off Lasix. 6. Diabetes mellitus type 2 - due to renal failure patient's metformin is on hold. Closely follow CBGs with sliding-scale. 7. COPD - presently not wheezing. Continue inhalers. 8. History of GI bleed and anemia - closely follow CBC. 9. Tobacco abuse - cessation counseling requested.  Note that most of patient's labs are  not crossing over at this time.  Code Status: Full code.  Family Communication: None.  Disposition Plan: Admit to inpatient.    Urbano Milhouse N. [facility] Pager [phone].  If 7PM-7AM, please contact night-coverage [email or web address] Password [email or web address] 09/08/2012, 1:27 AM

## 2012-09-08 NOTE — ED Provider Notes (Signed)
Medical screening examination/treatment/procedure(s) were conducted as a shared visit with non-physician practitioner(s) and myself.  I personally evaluated the patient during the encounter.   Please see my separate note.    Candyce Churn, MD 09/08/12 [phone]

## 2012-09-08 NOTE — Progress Notes (Signed)
  Pharmacy Note (Brief) - Cipro  62 yo F on Cipro for UTI. For full details, see previous pharmacist note. SCr = 4.86, CrCl ~46ml/min. Will schedule Cipro dose as 400mg  IV q24h for CrCl>55ml/min. Dose given at 6am this morning, next dose due tomorrow at 6am.  Darrol Angel, PharmD Pager: [phone] 09/08/2012 8:36 AM

## 2012-09-08 NOTE — ED Provider Notes (Signed)
Medical screening examination/treatment/procedure(s) were conducted as a shared visit with non-physician practitioner(s) and myself.  I personally evaluated the patient during the encounter  62 yo female with chronic chest pain and abdominal pain presenting with worsening pain.  Extensive workup unremarkable except for acute renal failure.  Well appearing on my exam without distress.  To be admitted to hospitalist.  No indication for emergent dialysis.  Candyce Churn, MD 09/08/12 1134

## 2012-09-08 NOTE — Consult Note (Signed)
Reason for Consult:AKI Referring Physician: Dr. Peebles [name] is an 62 y.o. female.  HPI: 62 yr female with extensive PMH.  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 No near syncope or lightheadedness. Has ankle edema.  No blood in BM.  Cr 4.8.  Hx of Cr .9-1.6 in past yr. Hx renal dz with severe UTI and has seen Dr. Lowell Guitar in past. UTI sx this wk and took OTC med.  On lisinopril until a few wk ago. Has not felt good for several months.  Denies NSAIDs. No recent hematuria or rash. FH stones but no renal disease, or inherited hearing, eye or ms skel defect.  Father died at 31 and mother at 9 both of CV dz.  2 healthy children.  Op 3 C-sec, appy, T&A, CE, stents, GB, L shoulder. Constitutional: weak and tired for months, no fevers Eyes: poor vision, Laser tx Ears, nose, mouth, throat, and face: few teeth remaining Respiratory: some cough and asthma, still smoking Cardiovascular: as above, sleeps on 2 pillows,nocturia x 3, ankle edema, legs gve out after 1.2 mi Gastrointestinal: as above,no abdm pain Genitourinary:as above Integument/breast: eczema Hematologic/lymphatic: bruises easy Musculoskeletal:arthitis hands and shoulders Neurological: negative Endocrine: as above Allergic/Immunologic: negative   Past Medical History  Diagnosis Date  . Peripheral vascular disease     status post left common iliac and external iliac artery stents  . Cerebrovascular disease     Dopplers 3/14:  RICA 60-79% (high end of range -  Stable), LICA 60-79%, mod R subclavian stenosis, mod R prox CCA stenosis;  F/u 6 mos  . Diabetes mellitus   .  COPD (chronic obstructive pulmonary disease)   . Hyperlipidemia   . Depression   . Diastolic congestive heart failure   . Hypertension   . Renal artery stenosis     status post  . CAD (coronary artery disease)     50% left main stenosis, ostial 60% LAD stenosis, 30% ramus intermediate stenosis, occluded right artery with left-to-right collaterals  . Iron deficiency anemia   . Diabetic retinopathy   . 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 Dieaulafoy's lesion 01/25/2012    Past Surgical History  Procedure Laterality Date  . Carotid endarterectomy    . Cholecystectomy    . Appendectomy    . Tonsillectomy    . Cesarean section    . Shoulder arthroscopy    . Iliac artery stent      left  . Enteroscopy  01/26/2012    Procedure: ENTEROSCOPY;  Surgeon: Iva Boop, MD;  Location: [facility];  Service: Endoscopy;  Laterality: N/A;    Family History  Problem Relation Age of Onset  . Hypertension Mother   . Heart attack Mother   . Hypertension Father   . Angina Father   . Cancer Sister   . Cancer Brother     Social History:  reports that she has been smoking Cigarettes.  She has a 36 pack-year smoking  history. She has never used smokeless tobacco. She reports that she does not drink alcohol or use illicit drugs.  Allergies: No Known Allergies  Medications:  I have reviewed the patient's current medications. Prior to Admission:  Prescriptions prior to admission  Medication Sig Dispense Refill  . acetaminophen (TYLENOL) 500 MG tablet Take 500 mg by mouth every 6 (six) hours as needed. For pain      . albuterol (PROVENTIL HFA;VENTOLIN HFA) 108 (90 BASE) MCG/ACT inhaler Inhale 2 puffs into the lungs every 6 (six) hours as needed. For shortness of breath       . ALPRAZolam (XANAX) 0.25 MG tablet Take 0.25 mg by mouth 2 (two) times daily.       [name] amLODipine (NORVASC) 10 MG tablet Take 10 mg by mouth  daily.       . calcium carbonate (OS-CAL) 600 MG TABS Take 600 mg by mouth 3 (three) times daily with meals.       . cloNIDine (CATAPRES) 0.1 MG tablet Take 0.1 mg by mouth 2 (two) times daily.      . Ferrous Sulfate (IRON) 325 (65 FE) MG TABS Take 1 tablet by mouth 3 (three) times daily.       . fexofenadine (ALLEGRA) 180 MG tablet Take 180 mg by mouth daily as needed. For allergies       . fish oil-omega-3 fatty acids 1000 MG capsule Take 1 g by mouth 3 (three) times daily. Three times daily      . fluticasone (FLONASE) 50 MCG/ACT nasal spray Place 2 sprays into the nose daily as needed. For allergies       . furosemide (LASIX) 80 MG tablet Take 0.5 tablets (40 mg total) by mouth daily.  30 tablet  0  . metFORMIN (GLUCOPHAGE) 1000 MG tablet Take 1,000 mg by mouth 2 (two) times daily with a meal.        . Multiple Vitamin (MULTIVITAMIN WITH MINERALS) TABS Take 1 tablet by mouth daily.      . pantoprazole (PROTONIX) 40 MG tablet Take 1 tablet (40 mg total) by mouth daily.  30 tablet  3  . potassium chloride (K-DUR) 10 MEQ tablet Take 10 mEq by mouth.        . rosuvastatin (CRESTOR) 40 MG tablet Take 40 mg by mouth every evening.       . sertraline (ZOLOFT) 100 MG tablet Take 200 mg by mouth daily.          Results for orders placed during the hospital encounter of 09/07/12 (from the past 48 hour(s))  URINALYSIS, ROUTINE W REFLEX MICROSCOPIC     Status: Abnormal   Collection Time    09/07/12  6:16 PM      Result Value Range   Color, Urine YELLOW  YELLOW   APPearance CLOUDY (*) CLEAR   Specific Gravity, Urine 1.017  1.005 - 1.030   pH 8.0  5.0 - 8.0   Glucose, UA NEGATIVE  NEGATIVE mg/dL   Hgb urine dipstick SMALL (*) NEGATIVE   Bilirubin Urine NEGATIVE  NEGATIVE   Ketones, ur NEGATIVE  NEGATIVE mg/dL   Protein, ur 161 (*) NEGATIVE mg/dL   Urobilinogen, UA 0.2  0.0 - 1.0 mg/dL   Nitrite NEGATIVE  NEGATIVE   Leukocytes, UA SMALL (*) NEGATIVE  URINE MICROSCOPIC-ADD ON     Status:  Abnormal   Collection Time    09/07/12  6:16 PM      Result Value Range   Squamous Epithelial /  LPF FEW (*) RARE   WBC, UA 7-10  <3 WBC/hpf   Bacteria, UA FEW (*) RARE   Urine-Other MUCOUS PRESENT    CBC WITH DIFFERENTIAL     Status: Abnormal   Collection Time    09/07/12  7:30 PM      Result Value Range   WBC 9.6  4.0 - 10.5 K/uL   RBC 3.88  3.87 - 5.11 MIL/uL   Hemoglobin 11.3 (*) 12.0 - 15.0 g/dL   HCT 21.3 (*) 08.6 - 57.8 %   MCV 89.7  78.0 - 100.0 fL   MCH 29.1  26.0 - 34.0 pg   MCHC 32.5  30.0 - 36.0 g/dL   RDW 46.9  62.9 - 52.8 %   Platelets 281  150 - 400 K/uL   Neutrophils Relative % 84 (*) 43 - 77 %   Neutro Abs 8.0 (*) 1.7 - 7.7 K/uL   Lymphocytes Relative 9 (*) 12 - 46 %   Lymphs Abs 0.9  0.7 - 4.0 K/uL   Monocytes Relative 6  3 - 12 %   Monocytes Absolute 0.6  0.1 - 1.0 K/uL   Eosinophils Relative 2  0 - 5 %   Eosinophils Absolute 0.1  0.0 - 0.7 K/uL   Basophils Relative 0  0 - 1 %   Basophils Absolute 0.0  0.0 - 0.1 K/uL  COMPREHENSIVE METABOLIC PANEL     Status: Abnormal   Collection Time    09/07/12  7:30 PM      Result Value Range   Sodium 135  135 - 145 mEq/L   Potassium 5.1  3.5 - 5.1 mEq/L   Chloride 95 (*) 96 - 112 mEq/L   CO2 30  19 - 32 mEq/L   Glucose, Bld 104 (*) 70 - 99 mg/dL   BUN 44 (*) 6 - 23 mg/dL   Creatinine, Ser 4.13 (*) 0.50 - 1.10 mg/dL   Calcium 24.4 (*) 8.4 - 10.5 mg/dL   Total Protein 6.8  6.0 - 8.3 g/dL   Albumin 3.0 (*) 3.5 - 5.2 g/dL   AST 15  0 - 37 U/L   ALT 7  0 - 35 U/L   Alkaline Phosphatase 96  39 - 117 U/L   Total Bilirubin 0.1 (*) 0.3 - 1.2 mg/dL   GFR calc non Af Amer 9 (*) >90 mL/min   GFR calc Af Amer 10 (*) >90 mL/min   Comment:            The eGFR has been calculated     using the CKD EPI equation.     This calculation has not been     validated in all clinical     situations.     eGFR's persistently     <90 mL/min signify     possible Chronic Kidney Disease.  LIPASE, BLOOD     Status: None   Collection  Time    09/07/12  7:30 PM      Result Value Range   Lipase 36  11 - 59 U/L  POCT I-STAT TROPONIN I     Status: None   Collection Time    09/07/12  8:45 PM      Result Value Range   Troponin i, poc 0.03  0.00 - 0.08 ng/mL   Comment 3            Comment: Due to the release kinetics of cTnI,     a negative result within the  first hours     of the onset of symptoms does not rule out     myocardial infarction with certainty.     If myocardial infarction is still suspected,     repeat the test at appropriate intervals.  POCT I-STAT TROPONIN I     Status: None   Collection Time    09/07/12  9:07 PM      Result Value Range   Troponin i, poc 0.02  0.00 - 0.08 ng/mL   Comment 3            Comment: Due to the release kinetics of cTnI,     a negative result within the first hours     of the onset of symptoms does not rule out     myocardial infarction with certainty.     If myocardial infarction is still suspected,     repeat the test at appropriate intervals.  POCT I-STAT TROPONIN I     Status: None   Collection Time    09/07/12 11:06 PM      Result Value Range   Troponin i, poc 0.02  0.00 - 0.08 ng/mL   Comment 3            Comment: Due to the release kinetics of cTnI,     a negative result within the first hours     of the onset of symptoms does not rule out     myocardial infarction with certainty.     If myocardial infarction is still suspected,     repeat the test at appropriate intervals.  TROPONIN I     Status: None   Collection Time    09/08/12  7:11 AM      Result Value Range   Troponin I <0.30  <0.30 ng/mL   Comment:            Due to the release kinetics of cTnI,     a negative result within the first hours     of the onset of symptoms does not rule out     myocardial infarction with certainty.     If myocardial infarction is still suspected,     repeat the test at appropriate intervals.  LACTIC ACID, PLASMA     Status: None   Collection Time    09/08/12  7:11 AM       Result Value Range   Lactic Acid, Venous 0.8  0.5 - 2.2 mmol/L  COMPREHENSIVE METABOLIC PANEL     Status: Abnormal   Collection Time    09/08/12  7:11 AM      Result Value Range   Sodium 134 (*) 135 - 145 mEq/L   Potassium 4.6  3.5 - 5.1 mEq/L   Chloride 97  96 - 112 mEq/L   CO2 31  19 - 32 mEq/L   Glucose, Bld 113 (*) 70 - 99 mg/dL   BUN 42 (*) 6 - 23 mg/dL   Creatinine, Ser 1.61 (*) 0.50 - 1.10 mg/dL   Calcium 09.6  8.4 - 04.5 mg/dL   Total Protein 6.3  6.0 - 8.3 g/dL   Albumin 2.7 (*) 3.5 - 5.2 g/dL   AST 13  0 - 37 U/L   ALT 6  0 - 35 U/L   Alkaline Phosphatase 95  39 - 117 U/L   Total Bilirubin 0.1 (*) 0.3 - 1.2 mg/dL   GFR calc non Af Amer 9 (*) >90 mL/min   GFR calc Af  Amer 10 (*) >90 mL/min   Comment:            The eGFR has been calculated     using the CKD EPI equation.     This calculation has not been     validated in all clinical     situations.     eGFR's persistently     <90 mL/min signify     possible Chronic Kidney Disease.  CBC WITH DIFFERENTIAL     Status: Abnormal   Collection Time    09/08/12  7:11 AM      Result Value Range   WBC 7.1  4.0 - 10.5 K/uL   RBC 4.00  3.87 - 5.11 MIL/uL   Hemoglobin 11.6 (*) 12.0 - 15.0 g/dL   HCT 16.1  09.6 - 04.5 %   MCV 90.3  78.0 - 100.0 fL   MCH 29.0  26.0 - 34.0 pg   MCHC 32.1  30.0 - 36.0 g/dL   RDW 40.9  81.1 - 91.4 %   Platelets 266  150 - 400 K/uL   Neutrophils Relative % 76  43 - 77 %   Neutro Abs 5.4  1.7 - 7.7 K/uL   Lymphocytes Relative 13  12 - 46 %   Lymphs Abs 0.9  0.7 - 4.0 K/uL   Monocytes Relative 8  3 - 12 %   Monocytes Absolute 0.6  0.1 - 1.0 K/uL   Eosinophils Relative 3  0 - 5 %   Eosinophils Absolute 0.2  0.0 - 0.7 K/uL   Basophils Relative 1  0 - 1 %   Basophils Absolute 0.0  0.0 - 0.1 K/uL  GLUCOSE, CAPILLARY     Status: Abnormal   Collection Time    09/08/12  7:24 AM      Result Value Range   Glucose-Capillary 114 (*) 70 - 99 mg/dL    Ct Abdomen Pelvis Wo  Contrast  09/07/2012   *RADIOLOGY REPORT*  Clinical Data: Abdominal pain  CT ABDOMEN AND PELVIS WITHOUT CONTRAST  Technique:  Multidetector CT imaging of the abdomen and pelvis was performed following the standard protocol without intravenous contrast.  Comparison: 11/03/2011  Findings: Minimal patchy dependent bibasilar atelectasis.  No pleural effusion. Cholecystectomy clips noted.  Unenhanced liver, adrenal glands, kidneys, spleen, and pancreas are normal.  Minimal nonspecific bilateral perinephric stranding noted without hydronephrosis or radiopaque renal or ureteral calculus.  Moderate atheromatous aortic calcification noted without aneurysm. Uterus and ovaries are normal.  No bowel wall thickening or focal segmental dilatation.  The appendix is not identified but there is no secondary evidence for acute appendicitis. Bones are osteopenic, which may mask subtle fracture.  No compression deformity is identified.  Schmorl's node formation incidentally noted. Concentric disc bulge is re-identified L4-L5 and L5-S1.  IMPRESSION: No acute intra-abdominal or pelvic pathology.   Original Report Authenticated By: Christiana Pellant, M.D.   Dg Chest 2 View  09/07/2012   *RADIOLOGY REPORT*  Clinical Data: Upper abdominal pain and chest tightness.  Nausea and diarrhea.  CHEST - 2 VIEW  Comparison: Chest radiograph performed 02/08/2012  Findings: The lungs are well-aerated.  Chronically increased interstitial markings and peribronchial thickening are seen. Previously noted vascular congestion has improved.  There is no evidence of focal opacification, pleural effusion or pneumothorax.  The heart is mildly enlarged.  No acute osseous abnormalities are seen.  Clips are noted within the right upper quadrant, reflecting prior cholecystectomy.  IMPRESSION: Mild chronic lung changes noted; no acute  cardiopulmonary process seen.  Mild cardiomegaly noted.   Original Report Authenticated By: Tonia Ghent, M.D.    @ROS @ Blood  pressure 154/43, pulse 65, temperature 98.4 F (36.9 C), temperature source Oral, resp. rate 12, height 4\' 9"  (1.448 m), weight 64.7 kg (142 lb 10.2 oz), SpO2 99.00%. @PHYSEXAMBYAGE2 @ Physical Examination: General appearance - chronically ill appearing and pale Mental status - alert, oriented to person, place, and time Eyes - bilat laser scars Mouth - not examined upper plate, poor lower teeth Neck - adenopathy noted PCL Lymphatics - posterior cervical nodes Chest - diminished bs Heart - S1 and S2 normal, systolic murmur Gr2/6 at apex Abdomen - pos bs,liver down 4 cm Musculoskeletal - hypertrophic changes in hands Extremities - Tr DP, bilat fem, midline abdm, bilat carotid bruits, tr edema. Trophic changes LE Skin - pale BP supine 156/6 HR 66, standing 158/56 HR 72  Assessment/Plan: 1 AKI  I am not sure this is acute and more likely subacute.  Suspect vascular dz with slow progression and bilat RAS, vs nonresolving injury related to Lisinipril and vasc dz. R/o AIN.  Not vol deplete at this time and not sure ever was.  This is more likely vascular or toxic and not a real good prognosis.  Need to eval etio and complic 2 Hx UTIs ? A role 3 Hypertension: clearly underlying nephrosclerosis 4. Anemia eval, suspect renal 5. Metabolic Bone Disease:  Check PTH 6 PVD suspect primary etio 7 CAD 8 COPD with smoking 9 AVMs 10 ^ Lipids 11 DM P stop ivf, eval renal art, U/s, eos, eval for dysproteinemia  Malley Hauter L 09/08/2012, 11:51 AM

## 2012-09-09 ENCOUNTER — Encounter: Payer: Self-pay | Admitting: Oncology

## 2012-09-09 DIAGNOSIS — I10 Essential (primary) hypertension: Secondary | ICD-10-CM

## 2012-09-09 DIAGNOSIS — N179 Acute kidney failure, unspecified: Principal | ICD-10-CM

## 2012-09-09 DIAGNOSIS — E860 Dehydration: Secondary | ICD-10-CM

## 2012-09-09 DIAGNOSIS — E119 Type 2 diabetes mellitus without complications: Secondary | ICD-10-CM

## 2012-09-09 LAB — CBC
MCH: 28.6 pg (ref 26.0–34.0)
MCV: 88.4 fL (ref 78.0–100.0)
Platelets: 227 10*3/uL (ref 150–400)
RDW: 13.2 % (ref 11.5–15.5)
WBC: 6.6 10*3/uL (ref 4.0–10.5)

## 2012-09-09 LAB — COMPREHENSIVE METABOLIC PANEL
AST: 12 U/L (ref 0–37)
Albumin: 2.6 g/dL — ABNORMAL LOW (ref 3.5–5.2)
Calcium: 9.7 mg/dL (ref 8.4–10.5)
Chloride: 101 mEq/L (ref 96–112)
Creatinine, Ser: 4.67 mg/dL — ABNORMAL HIGH (ref 0.50–1.10)

## 2012-09-09 LAB — GLUCOSE, CAPILLARY
Glucose-Capillary: 212 mg/dL — ABNORMAL HIGH (ref 70–99)
Glucose-Capillary: 108 mg/dL — ABNORMAL HIGH (ref 70–99)
Glucose-Capillary: 136 mg/dL — ABNORMAL HIGH (ref 70–99)

## 2012-09-09 LAB — URINE CULTURE

## 2012-09-09 LAB — PROTEIN, URINE, 24 HOUR
Collection Interval-UPROT: 24 hours
Protein, 24H Urine: 1248 mg/d — ABNORMAL HIGH (ref 50–100)
Protein, Urine: 78 mg/dL
Urine Total Volume-UPROT: 1600 mL

## 2012-09-09 LAB — HEPATITIS C ANTIBODY (REFLEX): HCV Ab: NEGATIVE

## 2012-09-09 LAB — CREATININE CLEARANCE, URINE, 24 HOUR
Collection Interval-CRCL: 24 hours
Urine Total Volume-CRCL: 1600 mL

## 2012-09-09 MED ORDER — GLUCERNA SHAKE PO LIQD
237.0000 mL | Freq: Two times a day (BID) | ORAL | Status: DC
  Administered 2012-09-09 – 2012-09-10 (×2): 237 mL via ORAL
  Filled 2012-09-09 (×3): qty 237

## 2012-09-09 MED ORDER — GLUCERNA SHAKE PO LIQD: 237.0000 mL | Freq: Three times a day (TID) | ORAL | Status: DC

## 2012-09-09 MED ORDER — DIPHENHYDRAMINE HCL 25 MG PO CAPS
25.0000 mg | ORAL_CAPSULE | Freq: Three times a day (TID) | ORAL | Status: DC | PRN
  Administered 2012-09-09: 25 mg via ORAL
  Filled 2012-09-09: qty 1

## 2012-09-09 NOTE — Progress Notes (Signed)
62 year old woman with multiple medical problems followed in this office for chronic iron deficiency anemia related to gastric and small bowel AVMs. She did not report for her visit here today. Reviewing the electronic record, it appears she was admitted to [facility] for further evaluation of abdominal pain. We will reschedule her visit when she is otherwise stable.

## 2012-09-09 NOTE — Progress Notes (Signed)
INITIAL NUTRITION ASSESSMENT  DOCUMENTATION CODES Per approved criteria  -Obesity Unspecified   INTERVENTION: 1. Glucerna Shake po BID, each supplement provides 220 kcal and 10 grams of protein.  NUTRITION DIAGNOSIS: Inadequate oral intake related to abdominal and chest pain as evidenced by reported weight loss.   Goal: Pt to meet >/= 90% of their estimated nutrition needs   Monitor:  Weight, po intake, labs  Reason for Assessment: MST  62 y.o. female  Admitting Dx: Acute renal failure  ASSESSMENT: Pt admitted with abdominal pain and chest pain. She has a history of COPD, DM, hypertension, CHF, renal artery stents, left iliac stent, and previous history of GI bleed. She reported having diarrhea x 4 days PTA. Pt was found to be in ARF most likely related to dehydration.   Pt reported that her appetite had been decreased PTA due to illness, but that it has increased since she has been in the hospital. She reports consuming about 50% of her meals. She said that she felt like she had lost weight, but was unsure how much or what her usual body weight was. She said that she would like to have Glucerna shakes to supplement her meals.  Height: Ht Readings from Last 1 Encounters:  09/08/12 4\' 9"  (1.448 m)    Weight: Wt Readings from Last 1 Encounters:  09/09/12 143 lb 5.5 oz (65.02 kg)    Ideal Body Weight: 38.6 kg  % Ideal Body Weight: 169%  Wt Readings from Last 10 Encounters:  09/09/12 143 lb 5.5 oz (65.02 kg)  05/14/12 140 lb (63.504 kg)  03/07/12 138 lb 1.6 oz (62.642 kg)  01/25/12 135 lb 2.3 oz (61.3 kg)  01/25/12 135 lb 2.3 oz (61.3 kg)  12/29/11 141 lb 14.4 oz (64.365 kg)  11/30/11 143 lb 1.6 oz (64.91 kg)  11/04/11 149 lb 11.2 oz (67.903 kg)  10/19/11 148 lb (67.132 kg)  09/06/11 150 lb (68.04 kg)    Usual Body Weight: unknown  % Usual Body Weight: unknown  BMI:  Body mass index is 31.01 kg/(m^2).  Estimated Nutritional Needs: Kcal: 1600-1900 Protein:  70-80 g Fluid: 1.6-1.9 L  Skin: WNL  Diet Order: Carb Control  EDUCATION NEEDS: -No education needs identified at this time   Intake/Output Summary (Last 24 hours) at 09/09/12 1420 Last data filed at 09/09/12 1329  Gross per 24 hour  Intake    980 ml  Output   2545 ml  Net  -1565 ml    Last BM: 7/19   Labs:   Recent Labs Lab 09/07/12 1930 09/08/12 0711 09/09/12 0515  NA 135 134* 135  K 5.1 4.6 4.7  CL 95* 97 101  CO2 30 31 27   BUN 44* 42* 50*  CREATININE 4.82* 4.86* 4.67*  CALCIUM 11.0* 10.2 9.7  PHOS  --   --  1.6*  GLUCOSE 104* 113* 126*    CBG (last 3)   Recent Labs  09/08/12 2136 09/09/12 0718 09/09/12 1135  GLUCAP 135* 139* 212*    Scheduled Meds: . ALPRAZolam  0.25 mg Oral BID  . amLODipine  10 mg Oral Daily  . atorvastatin  20 mg Oral q1800  . calcium carbonate  1,250 mg Oral TID WC  . ciprofloxacin  400 mg Intravenous Q24H  . cloNIDine  0.1 mg Oral BID  . ferrous sulfate  325 mg Oral TID PC  . insulin aspart  0-9 Units Subcutaneous TID WC  . loratadine  10 mg Oral Daily  . omega-3  acid ethyl esters  1 g Oral TID  . pantoprazole  40 mg Oral Daily  . sertraline  200 mg Oral Daily  . sodium chloride  3 mL Intravenous Q12H    Continuous Infusions:   Past Medical History  Diagnosis Date  . Peripheral vascular disease     status post left common iliac and external iliac artery stents  . Cerebrovascular disease     Dopplers 3/14:  RICA 60-79% (high end of range -  Stable), LICA 60-79%, mod R subclavian stenosis, mod R prox CCA stenosis;  F/u 6 mos  . Diabetes mellitus   . COPD (chronic obstructive pulmonary disease)   . Hyperlipidemia   . Depression   . Diastolic congestive heart failure   . Hypertension   . Renal artery stenosis     status post  . CAD (coronary artery disease)     50% left main stenosis, ostial 60% LAD stenosis, 30% ramus intermediate stenosis, occluded right artery with left-to-right collaterals  . Iron deficiency  anemia   . Diabetic retinopathy   . 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 Dieaulafoy's lesion 01/25/2012    Past Surgical History  Procedure Laterality Date  . Carotid endarterectomy    . Cholecystectomy    . Appendectomy    . Tonsillectomy    . Cesarean section    . Shoulder arthroscopy    . Iliac artery stent      left  . Enteroscopy  01/26/2012    Procedure: ENTEROSCOPY;  Surgeon: Iva Boop, MD;  Location: [facility];  Service: Endoscopy;  Laterality: N/A;    [name] RD, LDN

## 2012-09-09 NOTE — Progress Notes (Signed)
Subjective: Interval History: has no complaint, no change.  Objective: Vital signs in last 24 hours: Temp:  [98.2 F (36.8 C)-98.5 F (36.9 C)] 98.2 F (36.8 C) (07/20 0435) Pulse Rate:  [62-68] 62 (07/20 0435) Resp:  [16-20] 16 (07/20 0435) BP: (145-167)/(48-58) 145/58 mmHg (07/20 0435) SpO2:  [98 %-100 %] 100 % (07/20 0435) Weight:  [65.02 kg (143 lb 5.5 oz)] 65.02 kg (143 lb 5.5 oz) (07/20 0700) Weight change: 0.32 kg (11.3 oz)  Intake/Output from previous day: 07/19 0701 - 07/20 0700 In: 1400 [P.O.:800; I.V.:600] Out: 3145 [Urine:3145] Intake/Output this shift: Total I/O In: -  Out: 200 [Urine:200]  General appearance: alert, cooperative, moderately obese and pale Resp: diminished breath sounds bilaterally and rhonchi bilaterally Cardio: S1, S2 normal and systolic murmur: holosystolic 2/6, decrescendo at 2nd left intercostal space GI: obese , pos bs. liver down 4 cm Extremities: trophic changes distally, bilat carotid , fem, abdm bruits  Lab Results:  Recent Labs  09/08/12 1400 09/09/12 0515  WBC 7.5 6.6  HGB 11.4* 10.1*  HCT 35.5* 31.2*  PLT 280 227   BMET:  Recent Labs  09/08/12 0711 09/09/12 0515  NA 134* 135  K 4.6 4.7  CL 97 101  CO2 31 27  GLUCOSE 113* 126*  BUN 42* 50*  CREATININE 4.86* 4.67*  CALCIUM 10.2 9.7   No results found for this basename: PTH,  in the last 72 hours Iron Studies:  Recent Labs  09/08/12 1328  IRON 77  TIBC 308    Studies/Results: Ct Abdomen Pelvis Wo Contrast  09/07/2012   *RADIOLOGY REPORT*  Clinical Data: Abdominal pain  CT ABDOMEN AND PELVIS WITHOUT CONTRAST  Technique:  Multidetector CT imaging of the abdomen and pelvis was performed following the standard protocol without intravenous contrast.  Comparison: 11/03/2011  Findings: Minimal patchy dependent bibasilar atelectasis.  No pleural effusion. Cholecystectomy clips noted.  Unenhanced liver, adrenal glands, kidneys, spleen, and pancreas are normal.  Minimal  nonspecific bilateral perinephric stranding noted without hydronephrosis or radiopaque renal or ureteral calculus.  Moderate atheromatous aortic calcification noted without aneurysm. Uterus and ovaries are normal.  No bowel wall thickening or focal segmental dilatation.  The appendix is not identified but there is no secondary evidence for acute appendicitis. Bones are osteopenic, which may mask subtle fracture.  No compression deformity is identified.  Schmorl's node formation incidentally noted. Concentric disc bulge is re-identified L4-L5 and L5-S1.  IMPRESSION: No acute intra-abdominal or pelvic pathology.   Original Report Authenticated By: Christiana Pellant, M.D.   Dg Chest 2 View  09/07/2012   *RADIOLOGY REPORT*  Clinical Data: Upper abdominal pain and chest tightness.  Nausea and diarrhea.  CHEST - 2 VIEW  Comparison: Chest radiograph performed 02/08/2012  Findings: The lungs are well-aerated.  Chronically increased interstitial markings and peribronchial thickening are seen. Previously noted vascular congestion has improved.  There is no evidence of focal opacification, pleural effusion or pneumothorax.  The heart is mildly enlarged.  No acute osseous abnormalities are seen.  Clips are noted within the right upper quadrant, reflecting prior cholecystectomy.  IMPRESSION: Mild chronic lung changes noted; no acute cardiopulmonary process seen.  Mild cardiomegaly noted.   Original Report Authenticated By: Tonia Ghent, M.D.   US Renal  09/08/2012   *RADIOLOGY REPORT*  Clinical Data: Renal insufficiency.  History of vascular disease.  RENAL/URINARY TRACT ULTRASOUND COMPLETE  Comparison:  CT abdomen and pelvis 09/08/2012 and 12/10/2005.  Findings:  Right Kidney:   Measures 12.1 cm.  No stone,  mass or hydronephrosis.  Defect in the cortex of the superior pole of the right kidney is identified and most consistent with scar as seen on the prior CT.  Left Kidney:  Measures 9.4 cm.  No stone, mass or  hydronephrosis.  Bladder:  Unremarkable.  IMPRESSION: Negative for hydronephrosis or other acute abnormality.  Scar upper pole right kidney, unchanged.   Original Report Authenticated By: Holley Dexter, M.D.    I have reviewed the patient's current medications.  Assessment/Plan: ! CKD vs subacute AKI.  Do not feet this is acute and suspect ischemic renal disease vs nonresolving ATN.  Clearly has proteinuria and this is DM which is risk for nonresolution of injury.  Duplex, protein studies P. No evidence vol depletion 2 Anemia 3 ? UTI no pos culture 4 COPD 5  CAD 6 PVD P await labs.    LOS: 2 days   Gwen Sarvis L 09/09/2012,11:18 AM

## 2012-09-09 NOTE — Progress Notes (Signed)
[facility] PROGRESS NOTE  [name]:[id number] DOB: May 29, 1950 DOA: 09/07/2012 PCP: Burtis Junes, MD  Assessment/Plan: AKI- ? prerenal, Dr Deterding following, he does not thinkl that it is prerenal, work up underway for etiollogy of the AKI.  Diarrhea- Resolved, C diff is negative. ?UTI- UA is abnormal, will continue with Cipro till the culture results.  Diabetes Mellitus- CBG is stable, Sliding scale insulin.  COPD- Stable, continue inhalers.  Hypertension-continue catapres.  DVT prophylaxis- SCD   Code Status: Full Family Communication: *Discussed with patient in detail Disposition Plan: *Home when stable   Consultants: Nephrology  Procedures:  None  Antibiotics:  Cipro  HPI/Subjective: Patient seen and examined, diarrhea has resolved. C diff PCR is negative. Creatinine is still elevated.  Objective: Filed Vitals:   09/08/12 1448 09/08/12 2150 09/09/12 0435 09/09/12 0700  BP: 145/48 167/52 145/58   Pulse: 68 65 62   Temp: 98.5 F (36.9 C) 98.5 F (36.9 C) 98.2 F (36.8 C)   TempSrc: Oral Oral Oral   Resp: 18 20 16    Height:      Weight:    65.02 kg (143 lb 5.5 oz)  SpO2: 99% 98% 100%     Intake/Output Summary (Last 24 hours) at 09/09/12 1150 Last data filed at 09/09/12 1100  Gross per 24 hour  Intake   1340 ml  Output   2445 ml  Net  -1105 ml   Filed Weights   09/08/12 0355 09/09/12 0700  Weight: 64.7 kg (142 lb 10.2 oz) 65.02 kg (143 lb 5.5 oz)    Exam:   General:  *appear in no acute distress  Cardiovascular: *s1s2 RRR  Respiratory: *Clear bilaterally , no wheezing  Abdomen: *Soft, nontender, no organomegaly  Musculoskeletal: *No edema of the lower extremities  Data Reviewed: Basic Metabolic Panel:  Recent Labs Lab 09/07/12 1930 09/08/12 0711 09/09/12 0515  NA 135 134* 135  K 5.1 4.6 4.7  CL 95* 97 101  CO2 30 31 27   GLUCOSE 104* 113* 126*  BUN 44* 42* 50*  CREATININE 4.82* 4.86* 4.67*  CALCIUM 11.0*  10.2 9.7  PHOS  --   --  1.6*   Liver Function Tests:  Recent Labs Lab 09/07/12 1930 09/08/12 0711 09/09/12 0515  AST 15 13 12   ALT 7 6 5   ALKPHOS 96 95 86  BILITOT 0.1* 0.1* 0.1*  PROT 6.8 6.3 5.8*  ALBUMIN 3.0* 2.7* 2.6*    Recent Labs Lab 09/07/12 1930  LIPASE 36   No results found for this basename: AMMONIA,  in the last 168 hours CBC:  Recent Labs Lab 09/07/12 1930 09/08/12 0711 09/08/12 1400 09/09/12 0515  WBC 9.6 7.1 7.5 6.6  NEUTROABS 8.0* 5.4 5.8  --   HGB 11.3* 11.6* 11.4* 10.1*  HCT 34.8* 36.1 35.5* 31.2*  MCV 89.7 90.3 90.3 88.4  PLT 281 266 280 227   Cardiac Enzymes:  Recent Labs Lab 09/08/12 0711 09/08/12 1328 09/08/12 1922  TROPONINI <0.30 <0.30 <0.30   BNP (last 3 results)  Recent Labs  10/29/11 1002 11/03/11 2017  PROBNP 1049.0* 922.7*   CBG:  Recent Labs Lab 09/08/12 1209 09/08/12 1718 09/08/12 2136 09/09/12 0718 09/09/12 1135  GLUCAP 114* 128* 135* 139* 212*    Recent Results (from the past 240 hour(s))  CLOSTRIDIUM DIFFICILE BY PCR     Status: None   Collection Time    09/08/12  9:48 AM      Result Value Range Status   C difficile by  pcr NEGATIVE  NEGATIVE Final     Studies: Ct Abdomen Pelvis Wo Contrast  09/07/2012   *RADIOLOGY REPORT*  Clinical Data: Abdominal pain  CT ABDOMEN AND PELVIS WITHOUT CONTRAST  Technique:  Multidetector CT imaging of the abdomen and pelvis was performed following the standard protocol without intravenous contrast.  Comparison: 11/03/2011  Findings: Minimal patchy dependent bibasilar atelectasis.  No pleural effusion. Cholecystectomy clips noted.  Unenhanced liver, adrenal glands, kidneys, spleen, and pancreas are normal.  Minimal nonspecific bilateral perinephric stranding noted without hydronephrosis or radiopaque renal or ureteral calculus.  Moderate atheromatous aortic calcification noted without aneurysm. Uterus and ovaries are normal.  No bowel wall thickening or focal segmental  dilatation.  The appendix is not identified but there is no secondary evidence for acute appendicitis. Bones are osteopenic, which may mask subtle fracture.  No compression deformity is identified.  Schmorl's node formation incidentally noted. Concentric disc bulge is re-identified L4-L5 and L5-S1.  IMPRESSION: No acute intra-abdominal or pelvic pathology.   Original Report Authenticated By: Christiana Pellant, M.D.   Dg Chest 2 View  09/07/2012   *RADIOLOGY REPORT*  Clinical Data: Upper abdominal pain and chest tightness.  Nausea and diarrhea.  CHEST - 2 VIEW  Comparison: Chest radiograph performed 02/08/2012  Findings: The lungs are well-aerated.  Chronically increased interstitial markings and peribronchial thickening are seen. Previously noted vascular congestion has improved.  There is no evidence of focal opacification, pleural effusion or pneumothorax.  The heart is mildly enlarged.  No acute osseous abnormalities are seen.  Clips are noted within the right upper quadrant, reflecting prior cholecystectomy.  IMPRESSION: Mild chronic lung changes noted; no acute cardiopulmonary process seen.  Mild cardiomegaly noted.   Original Report Authenticated By: Tonia Ghent, M.D.   US Renal  09/08/2012   *RADIOLOGY REPORT*  Clinical Data: Renal insufficiency.  History of vascular disease.  RENAL/URINARY TRACT ULTRASOUND COMPLETE  Comparison:  CT abdomen and pelvis 09/08/2012 and 12/10/2005.  Findings:  Right Kidney:   Measures 12.1 cm.  No stone, mass or hydronephrosis.  Defect in the cortex of the superior pole of the right kidney is identified and most consistent with scar as seen on the prior CT.  Left Kidney:  Measures 9.4 cm.  No stone, mass or hydronephrosis.  Bladder:  Unremarkable.  IMPRESSION: Negative for hydronephrosis or other acute abnormality.  Scar upper pole right kidney, unchanged.   Original Report Authenticated By: Holley Dexter, M.D.    Scheduled Meds: . ALPRAZolam  0.25 mg Oral BID  .  amLODipine  10 mg Oral Daily  . atorvastatin  20 mg Oral q1800  . calcium carbonate  1,250 mg Oral TID WC  . ciprofloxacin  400 mg Intravenous Q24H  . cloNIDine  0.1 mg Oral BID  . ferrous sulfate  325 mg Oral TID PC  . insulin aspart  0-9 Units Subcutaneous TID WC  . loratadine  10 mg Oral Daily  . omega-3 acid ethyl esters  1 g Oral TID  . pantoprazole  40 mg Oral Daily  . sertraline  200 mg Oral Daily  . sodium chloride  3 mL Intravenous Q12H   Continuous Infusions:   Principal Problem:   Acute renal failure Active Problems:   DIABETES MELLITUS, TYPE II   CHF   COPD   Diarrhea    Time spent: *35 min    [facility] S  [facility] Pager [phone]. If 7PM-7AM, please contact night-coverage at [email or web address], password [facility] 09/09/2012, 11:50 AM  LOS: 2 days

## 2012-09-09 NOTE — Progress Notes (Signed)
*  PRELIMINARY RESULTS* Vascular Ultrasound Renal Artery Duplex has been completed.   Study was technically difficult due to overlying bowel gas and respiratory interference.   There is no obvious evidence of hemodynamically significant renal artery stenosis >60%. Bilateral intrarenal arteries exhibit a lack of diastolic flow. There is evidence of elevated velocities of the mid aorta. 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  Incidental finding: The pancreas appears to be hypoechoic and heterogenous.  09/09/2012 9:12 AM Gertie Fey, RVT, RDCS, RDMS

## 2012-09-10 DIAGNOSIS — J449 Chronic obstructive pulmonary disease, unspecified: Secondary | ICD-10-CM

## 2012-09-10 DIAGNOSIS — R197 Diarrhea, unspecified: Secondary | ICD-10-CM

## 2012-09-10 LAB — CBC
HCT: 30.5 % — ABNORMAL LOW (ref 36.0–46.0)
Hemoglobin: 10.1 g/dL — ABNORMAL LOW (ref 12.0–15.0)
MCH: 28.9 pg (ref 26.0–34.0)
MCHC: 33.1 g/dL (ref 30.0–36.0)
MCV: 87.1 fL (ref 78.0–100.0)
Platelets: 213 10*3/uL (ref 150–400)
RBC: 3.5 MIL/uL — ABNORMAL LOW (ref 3.87–5.11)
RDW: 12.9 % (ref 11.5–15.5)
WBC: 7.1 10*3/uL (ref 4.0–10.5)

## 2012-09-10 LAB — GLUCOSE, CAPILLARY: Glucose-Capillary: 118 mg/dL — ABNORMAL HIGH (ref 70–99)

## 2012-09-10 LAB — URINE CULTURE: Colony Count: 75000

## 2012-09-10 LAB — RENAL FUNCTION PANEL
Albumin: 2.5 g/dL — ABNORMAL LOW (ref 3.5–5.2)
BUN: 57 mg/dL — ABNORMAL HIGH (ref 6–23)
Chloride: 102 mEq/L (ref 96–112)
GFR calc Af Amer: 11 mL/min — ABNORMAL LOW (ref >90)
Glucose, Bld: 106 mg/dL — ABNORMAL HIGH (ref 70–99)
Phosphorus: 2 mg/dL — ABNORMAL LOW (ref 2.3–4.6)
Potassium: 4.2 mEq/L (ref 3.5–5.1)
Sodium: 137 mEq/L (ref 135–145)

## 2012-09-10 LAB — KAPPA/LAMBDA LIGHT CHAINS
Kappa, lambda light chain ratio: 1.89 — ABNORMAL HIGH (ref 0.26–1.65)
Lambda free light chains: 6.2 mg/dL — ABNORMAL HIGH (ref 0.57–2.63)

## 2012-09-10 LAB — PARATHYROID HORMONE, INTACT (NO CA): PTH: 7.3 pg/mL — ABNORMAL LOW (ref 14.0–72.0)

## 2012-09-10 NOTE — Discharge Summary (Signed)
Physician Discharge Summary  [name] NFA:[id number] DOB: Jun 26, 1950 DOA: 09/07/2012  PCP: Burtis Junes, MD  Admit date: 09/07/2012 Discharge date: 09/10/2012  Time spent: 50* minutes  Recommendations for Outpatient Follow-up:  1. Follow up Nephrology in one week 2. Follow up PCP in 2 weeks  Discharge Diagnoses:  Principal Problem:   Acute renal failure Active Problems:   DIABETES MELLITUS, TYPE II   CHF   COPD   Diarrhea   Discharge Condition: Stable  Diet recommendation: carb modified diet  Filed Weights   09/08/12 0355 09/09/12 0700 09/10/12 0500  Weight: 64.7 kg (142 lb 10.2 oz) 65.02 kg (143 lb 5.5 oz) 65.3 kg (143 lb 15.4 oz)    History of present illness:  62 y.o. female with known history of COPD, diabetes mellitus2, hypertension, diastolic CHF, renal artery stents and left iliac stent and previous history of GI bleed presented to the ER because of abdominal pain and some chest discomfort. Patient states she has been having abdominal pain mostly periumbilical for the last 3 days with diarrhea over the last 4 days. The pain has been colicky in nature denies any associated nausea vomiting or fever chills. Patient denies having used any recent antibiotics hospitalization. Denies having used NSAIDs or any new medications. In addition patient has been getting off and on chest discomfort which is retrosternal for last 2-3 days. The pain is pressure-like nonradiating with no associated shortness of breath. Denies any productive cough or fever chills. In the ER patient had a CT abdomen and pelvis without contrast which showed nothing acute. Cardiac markers are negative. Patient's labs show elevated creatinine around 4 from a baseline of around 1.3 last December. Patient has been admitted for further management.   Hospital Course:  AKI- ? prerenal, Dr Deterding following, he does not thinkl that it is prerenal, work up underway for etiology of the AKI. Patient's  creatinine is still elevated, work up followed by nephrology. Will d/c home as per Dr Deterding recommendations, will follow up nephrology in one week. Diarrhea- Resolved, C diff is negative.  ?UTI- Urine culture growing morphophytes, Cipro has been discontinued. Diabetes Mellitus- CBG is stable, will d/c metformin. Hba1c has been stable, if Blood glucose start rising may benefit from amaryl or glucotrol. COPD- Stable, continue inhalers.  Hypertension-BP controlled,will continue with amlodipine and discontinue Catapres as per Dr Deterding recommendation.   Procedures:  Renal ultrasound  Consultations:  Nephrology  Discharge Exam: Filed Vitals:   09/09/12 1300 09/09/12 2145 09/10/12 0500 09/10/12 0559  BP: 104/37 143/38  135/52  Pulse: 56 64  53  Temp: 97.6 F (36.4 C) 98.2 F (36.8 C)  97.9 F (36.6 C)  TempSrc: Oral Oral  Oral  Resp: 18 18  20   Height:      Weight:   65.3 kg (143 lb 15.4 oz)   SpO2: 97% 96%  93%     Discharge Instructions  Discharge Orders   Future Orders Complete By Expires     Diet Carb Modified  As directed     Increase activity slowly  As directed         Medication List    STOP taking these medications       cloNIDine 0.1 MG tablet  Commonly known as:  CATAPRES     metFORMIN 1000 MG tablet  Commonly known as:  GLUCOPHAGE     potassium chloride 10 MEQ tablet  Commonly known as:  K-DUR      TAKE these medications  acetaminophen 500 MG tablet  Commonly known as:  TYLENOL  Take 500 mg by mouth every 6 (six) hours as needed. For pain     albuterol 108 (90 BASE) MCG/ACT inhaler  Commonly known as:  PROVENTIL HFA;VENTOLIN HFA  Inhale 2 puffs into the lungs every 6 (six) hours as needed. For shortness of breath     ALPRAZolam 0.25 MG tablet  Commonly known as:  XANAX  Take 0.25 mg by mouth 2 (two) times daily.     amLODipine 10 MG tablet  Commonly known as:  NORVASC  Take 10 mg by mouth daily.     calcium carbonate 600 MG  Tabs  Commonly known as:  OS-CAL  Take 600 mg by mouth 3 (three) times daily with meals.     fexofenadine 180 MG tablet  Commonly known as:  ALLEGRA  Take 180 mg by mouth daily as needed. For allergies     fish oil-omega-3 fatty acids 1000 MG capsule  Take 1 g by mouth 3 (three) times daily. Three times daily     fluticasone 50 MCG/ACT nasal spray  Commonly known as:  FLONASE  Place 2 sprays into the nose daily as needed. For allergies     furosemide 80 MG tablet  Commonly known as:  LASIX  Take 0.5 tablets (40 mg total) by mouth daily.     Iron 325 (65 FE) MG Tabs  Take 1 tablet by mouth 3 (three) times daily.     multivitamin with minerals Tabs  Take 1 tablet by mouth daily.     pantoprazole 40 MG tablet  Commonly known as:  PROTONIX  Take 1 tablet (40 mg total) by mouth daily.     rosuvastatin 40 MG tablet  Commonly known as:  CRESTOR  Take 40 mg by mouth every evening.     ZOLOFT 100 MG tablet  Generic drug:  sertraline  Take 200 mg by mouth daily.       No Known Allergies    The results of significant diagnostics from this hospitalization (including imaging, microbiology, ancillary and laboratory) are listed below for reference.    Significant Diagnostic Studies: Ct Abdomen Pelvis Wo Contrast  09/07/2012   *RADIOLOGY REPORT*  Clinical Data: Abdominal pain  CT ABDOMEN AND PELVIS WITHOUT CONTRAST  Technique:  Multidetector CT imaging of the abdomen and pelvis was performed following the standard protocol without intravenous contrast.  Comparison: 11/03/2011  Findings: Minimal patchy dependent bibasilar atelectasis.  No pleural effusion. Cholecystectomy clips noted.  Unenhanced liver, adrenal glands, kidneys, spleen, and pancreas are normal.  Minimal nonspecific bilateral perinephric stranding noted without hydronephrosis or radiopaque renal or ureteral calculus.  Moderate atheromatous aortic calcification noted without aneurysm. Uterus and ovaries are normal.  No  bowel wall thickening or focal segmental dilatation.  The appendix is not identified but there is no secondary evidence for acute appendicitis. Bones are osteopenic, which may mask subtle fracture.  No compression deformity is identified.  Schmorl's node formation incidentally noted. Concentric disc bulge is re-identified L4-L5 and L5-S1.  IMPRESSION: No acute intra-abdominal or pelvic pathology.   Original Report Authenticated By: Christiana Pellant, M.D.   Dg Chest 2 View  09/07/2012   *RADIOLOGY REPORT*  Clinical Data: Upper abdominal pain and chest tightness.  Nausea and diarrhea.  CHEST - 2 VIEW  Comparison: Chest radiograph performed 02/08/2012  Findings: The lungs are well-aerated.  Chronically increased interstitial markings and peribronchial thickening are seen. Previously noted vascular congestion has improved.  There is  no evidence of focal opacification, pleural effusion or pneumothorax.  The heart is mildly enlarged.  No acute osseous abnormalities are seen.  Clips are noted within the right upper quadrant, reflecting prior cholecystectomy.  IMPRESSION: Mild chronic lung changes noted; no acute cardiopulmonary process seen.  Mild cardiomegaly noted.   Original Report Authenticated By: Tonia Ghent, M.D.   US Renal  09/08/2012   *RADIOLOGY REPORT*  Clinical Data: Renal insufficiency.  History of vascular disease.  RENAL/URINARY TRACT ULTRASOUND COMPLETE  Comparison:  CT abdomen and pelvis 09/08/2012 and 12/10/2005.  Findings:  Right Kidney:   Measures 12.1 cm.  No stone, mass or hydronephrosis.  Defect in the cortex of the superior pole of the right kidney is identified and most consistent with scar as seen on the prior CT.  Left Kidney:  Measures 9.4 cm.  No stone, mass or hydronephrosis.  Bladder:  Unremarkable.  IMPRESSION: Negative for hydronephrosis or other acute abnormality.  Scar upper pole right kidney, unchanged.   Original Report Authenticated By: Holley Dexter, M.D.     Microbiology: Recent Results (from the past 240 hour(s))  URINE CULTURE     Status: None   Collection Time    09/07/12  6:16 PM      Result Value Range Status   Specimen Description URINE, CLEAN CATCH   Final   Special Requests NONE   Final   Culture  Setup Time 09/08/2012 14:06   Final   Colony Count 75,000 COLONIES/ML   Final   Culture ESCHERICHIA COLI   Final   Report Status 09/10/2012 FINAL   Final   Organism ID, Bacteria ESCHERICHIA COLI   Final  URINE CULTURE     Status: None   Collection Time    09/08/12  8:03 AM      Result Value Range Status   Specimen Description URINE, CLEAN CATCH   Final   Special Requests NONE   Final   Culture  Setup Time 09/08/2012 14:06   Final   Colony Count 20,OOO COLONIES/ML   Final   Culture     Final   Value: Multiple bacterial morphotypes present, none predominant. Suggest appropriate recollection if clinically indicated.   Report Status 09/09/2012 FINAL   Final  CLOSTRIDIUM DIFFICILE BY PCR     Status: None   Collection Time    09/08/12  9:48 AM      Result Value Range Status   C difficile by pcr NEGATIVE  NEGATIVE Final  STOOL CULTURE     Status: None   Collection Time    09/08/12 10:59 AM      Result Value Range Status   Specimen Description STOOL   Final   Special Requests NONE   Final   Culture NO SUSPICIOUS COLONIES, CONTINUING TO HOLD   Final   Report Status PENDING   Incomplete     Labs: Basic Metabolic Panel:  Recent Labs Lab 09/07/12 1930 09/08/12 0711 09/09/12 0515 09/09/12 1332 09/10/12 0451  NA 135 134* 135  --  137  K 5.1 4.6 4.7  --  4.2  CL 95* 97 101  --  102  CO2 30 31 27   --  29  GLUCOSE 104* 113* 126*  --  106*  BUN 44* 42* 50*  --  57*  CREATININE 4.82* 4.86* 4.67* 4.77* 4.40*  CALCIUM 11.0* 10.2 9.7  --  9.0  PHOS  --   --  1.6*  --  2.0*   Liver Function Tests:  Recent  Labs Lab 09/07/12 1930 09/08/12 0711 09/09/12 0515 09/10/12 0451  AST 15 13 12   --   ALT 7 6 5   --   ALKPHOS 96 95  86  --   BILITOT 0.1* 0.1* 0.1*  --   PROT 6.8 6.3 5.8*  --   ALBUMIN 3.0* 2.7* 2.6* 2.5*    Recent Labs Lab 09/07/12 1930  LIPASE 36   No results found for this basename: AMMONIA,  in the last 168 hours CBC:  Recent Labs Lab 09/07/12 1930 09/08/12 0711 09/08/12 1400 09/09/12 0515 09/10/12 0451  WBC 9.6 7.1 7.5 6.6 7.1  NEUTROABS 8.0* 5.4 5.8  --   --   HGB 11.3* 11.6* 11.4* 10.1* 10.1*  HCT 34.8* 36.1 35.5* 31.2* 30.5*  MCV 89.7 90.3 90.3 88.4 87.1  PLT 281 266 280 227 213   Cardiac Enzymes:  Recent Labs Lab 09/08/12 0711 09/08/12 1328 09/08/12 1922  TROPONINI <0.30 <0.30 <0.30   BNP: BNP (last 3 results)  Recent Labs  10/29/11 1002 11/03/11 2017  PROBNP 1049.0* 922.7*   CBG:  Recent Labs Lab 09/09/12 0718 09/09/12 1135 09/09/12 1622 09/09/12 2209 09/10/12 0741  GLUCAP 139* 212* 108* 136* 118*       Signed:  Grayer Sproles S  [facility] 09/10/2012, 11:17 AM

## 2012-09-10 NOTE — Progress Notes (Signed)
Subjective: Interval History: none.  Objective: Vital signs in last 24 hours: Temp:  [97.6 F (36.4 C)-98.2 F (36.8 C)] 97.9 F (36.6 C) (07/21 0559) Pulse Rate:  [53-64] 53 (07/21 0559) Resp:  [18-20] 20 (07/21 0559) BP: (104-143)/(37-52) 135/52 mmHg (07/21 0559) SpO2:  [93 %-97 %] 93 % (07/21 0559) Weight:  [65.3 kg (143 lb 15.4 oz)] 65.3 kg (143 lb 15.4 oz) (07/21 0500) Weight change: 0.28 kg (9.9 oz)  Intake/Output from previous day: 07/20 0701 - 07/21 0700 In: 1100 [P.O.:900; IV Piggyback:200] Out: 2000 [Urine:2000] Intake/Output this shift: Total I/O In: -  Out: 800 [Urine:800]  General appearance: alert, cooperative and moderately obese Resp: diminished breath sounds bilaterally Cardio: S1, S2 normal and systolic murmur: holosystolic 2/6, blowing at apex GI: obese, pos bs, liver down 4 cm Extremities: bilat fem, abdm and carotid bruits  Lab Results:  Recent Labs  09/09/12 0515 09/10/12 0451  WBC 6.6 7.1  HGB 10.1* 10.1*  HCT 31.2* 30.5*  PLT 227 213   BMET:  Recent Labs  09/09/12 0515 09/09/12 1332 09/10/12 0451  NA 135  --  137  K 4.7  --  4.2  CL 101  --  102  CO2 27  --  29  GLUCOSE 126*  --  106*  BUN 50*  --  57*  CREATININE 4.67* 4.77* 4.40*  CALCIUM 9.7  --  9.0   No results found for this basename: PTH,  in the last 72 hours Iron Studies:  Recent Labs  09/08/12 1328  IRON 77  TIBC 308    Studies/Results: US Renal  09/08/2012   *RADIOLOGY REPORT*  Clinical Data: Renal insufficiency.  History of vascular disease.  RENAL/URINARY TRACT ULTRASOUND COMPLETE  Comparison:  CT abdomen and pelvis 09/08/2012 and 12/10/2005.  Findings:  Right Kidney:   Measures 12.1 cm.  No stone, mass or hydronephrosis.  Defect in the cortex of the superior pole of the right kidney is identified and most consistent with scar as seen on the prior CT.  Left Kidney:  Measures 9.4 cm.  No stone, mass or hydronephrosis.  Bladder:  Unremarkable.  IMPRESSION: Negative  for hydronephrosis or other acute abnormality.  Scar upper pole right kidney, unchanged.   Original Report Authenticated By: Holley Dexter, M.D.    I have reviewed the patient's current medications.  Assessment/Plan: 1 AKI/CKD slow decrease Cr.  Suspect subacute AKI with ischemia, ACEI, and recent contrast injury.minimal complic now . Can d/c and f/u outpatient.  May need CO2 angio in future. 2 HTN controlle 3 DM controlle 4 anemia stable 5 PVD 6 CAD 7 UTI can use po ab P Can d/c home from my standpoint, f/u Cr as outpatient. Po ab    LOS: 3 days   [name] L 09/10/2012,10:57 AM

## 2012-09-10 NOTE — Progress Notes (Signed)
[facility] PROGRESS NOTE  [name]:[id number] DOB: 01-31-1951 DOA: 09/07/2012 PCP: Burtis Junes, MD  Assessment/Plan: AKI- ? prerenal, Dr Deterding following, he does not thinkl that it is prerenal, work up underway for etiology of the AKI.  Diarrhea- Resolved, C diff is negative. ?UTI- Urine culture growing morphophytes, will d/c Cipro. Diabetes Mellitus- CBG is stable, Sliding scale insulin.  COPD- Stable, continue inhalers.  Hypertension-BP controlled, continue catapres.  DVT prophylaxis- SCD   Code Status: Full Family Communication: *Discussed with patient in detail Disposition Plan: *Home when stable   Consultants: Nephrology  Procedures:  None  Antibiotics:  Cipro  HPI/Subjective: Patient seen and examined, diarrhea has resolved. C diff PCR is negative. Creatinine is still elevated. Nephrology is following.  Objective: Filed Vitals:   09/09/12 1300 09/09/12 2145 09/10/12 0500 09/10/12 0559  BP: 104/37 143/38  135/52  Pulse: 56 64  53  Temp: 97.6 F (36.4 C) 98.2 F (36.8 C)  97.9 F (36.6 C)  TempSrc: Oral Oral  Oral  Resp: 18 18  20   Height:      Weight:   65.3 kg (143 lb 15.4 oz)   SpO2: 97% 96%  93%    Intake/Output Summary (Last 24 hours) at 09/10/12 0857 Last data filed at 09/10/12 0831  Gross per 24 hour  Intake   1100 ml  Output   2500 ml  Net  -1400 ml   Filed Weights   09/08/12 0355 09/09/12 0700 09/10/12 0500  Weight: 64.7 kg (142 lb 10.2 oz) 65.02 kg (143 lb 5.5 oz) 65.3 kg (143 lb 15.4 oz)    Exam:   General:  *appear in no acute distress  Cardiovascular: *s1s2 RRR  Respiratory: *Clear bilaterally , no wheezing  Abdomen: *Soft, nontender, no organomegaly  Musculoskeletal: *No edema of the lower extremities  Data Reviewed: Basic Metabolic Panel:  Recent Labs Lab 09/07/12 1930 09/08/12 0711 09/09/12 0515 09/09/12 1332 09/10/12 0451  NA 135 134* 135  --  137  K 5.1 4.6 4.7  --  4.2  CL 95* 97  101  --  102  CO2 30 31 27   --  29  GLUCOSE 104* 113* 126*  --  106*  BUN 44* 42* 50*  --  57*  CREATININE 4.82* 4.86* 4.67* 4.77* 4.40*  CALCIUM 11.0* 10.2 9.7  --  9.0  PHOS  --   --  1.6*  --  2.0*   Liver Function Tests:  Recent Labs Lab 09/07/12 1930 09/08/12 0711 09/09/12 0515 09/10/12 0451  AST 15 13 12   --   ALT 7 6 5   --   ALKPHOS 96 95 86  --   BILITOT 0.1* 0.1* 0.1*  --   PROT 6.8 6.3 5.8*  --   ALBUMIN 3.0* 2.7* 2.6* 2.5*    Recent Labs Lab 09/07/12 1930  LIPASE 36   No results found for this basename: AMMONIA,  in the last 168 hours CBC:  Recent Labs Lab 09/07/12 1930 09/08/12 0711 09/08/12 1400 09/09/12 0515 09/10/12 0451  WBC 9.6 7.1 7.5 6.6 7.1  NEUTROABS 8.0* 5.4 5.8  --   --   HGB 11.3* 11.6* 11.4* 10.1* 10.1*  HCT 34.8* 36.1 35.5* 31.2* 30.5*  MCV 89.7 90.3 90.3 88.4 87.1  PLT 281 266 280 227 213   Cardiac Enzymes:  Recent Labs Lab 09/08/12 0711 09/08/12 1328 09/08/12 1922  TROPONINI <0.30 <0.30 <0.30   BNP (last 3 results)  Recent Labs  10/29/11 1002 11/03/11 2017  PROBNP 1049.0* 922.7*   CBG:  Recent Labs Lab 09/09/12 0718 09/09/12 1135 09/09/12 1622 09/09/12 2209 09/10/12 0741  GLUCAP 139* 212* 108* 136* 118*    Recent Results (from the past 240 hour(s))  URINE CULTURE     Status: None   Collection Time    09/07/12  6:16 PM      Result Value Range Status   Specimen Description URINE, CLEAN CATCH   Final   Special Requests NONE   Final   Culture  Setup Time 09/08/2012 14:06   Final   Colony Count 75,000 COLONIES/ML   Final   Culture ESCHERICHIA COLI   Final   Report Status 09/10/2012 FINAL   Final   Organism ID, Bacteria ESCHERICHIA COLI   Final  URINE CULTURE     Status: None   Collection Time    09/08/12  8:03 AM      Result Value Range Status   Specimen Description URINE, CLEAN CATCH   Final   Special Requests NONE   Final   Culture  Setup Time 09/08/2012 14:06   Final   Colony Count 20,OOO  COLONIES/ML   Final   Culture     Final   Value: Multiple bacterial morphotypes present, none predominant. Suggest appropriate recollection if clinically indicated.   Report Status 09/09/2012 FINAL   Final  CLOSTRIDIUM DIFFICILE BY PCR     Status: None   Collection Time    09/08/12  9:48 AM      Result Value Range Status   C difficile by pcr NEGATIVE  NEGATIVE Final  STOOL CULTURE     Status: None   Collection Time    09/08/12 10:59 AM      Result Value Range Status   Specimen Description STOOL   Final   Special Requests NONE   Final   Culture NO SUSPICIOUS COLONIES, CONTINUING TO HOLD   Final   Report Status PENDING   Incomplete     Studies: US Renal  09/08/2012   *RADIOLOGY REPORT*  Clinical Data: Renal insufficiency.  History of vascular disease.  RENAL/URINARY TRACT ULTRASOUND COMPLETE  Comparison:  CT abdomen and pelvis 09/08/2012 and 12/10/2005.  Findings:  Right Kidney:   Measures 12.1 cm.  No stone, mass or hydronephrosis.  Defect in the cortex of the superior pole of the right kidney is identified and most consistent with scar as seen on the prior CT.  Left Kidney:  Measures 9.4 cm.  No stone, mass or hydronephrosis.  Bladder:  Unremarkable.  IMPRESSION: Negative for hydronephrosis or other acute abnormality.  Scar upper pole right kidney, unchanged.   Original Report Authenticated By: Holley Dexter, M.D.    Scheduled Meds: . ALPRAZolam  0.25 mg Oral BID  . amLODipine  10 mg Oral Daily  . atorvastatin  20 mg Oral q1800  . calcium carbonate  1,250 mg Oral TID WC  . ciprofloxacin  400 mg Intravenous Q24H  . cloNIDine  0.1 mg Oral BID  . feeding supplement  237 mL Oral BID BM  . ferrous sulfate  325 mg Oral TID PC  . insulin aspart  0-9 Units Subcutaneous TID WC  . loratadine  10 mg Oral Daily  . omega-3 acid ethyl esters  1 g Oral TID  . pantoprazole  40 mg Oral Daily  . sertraline  200 mg Oral Daily  . sodium chloride  3 mL Intravenous Q12H   Continuous Infusions:    Principal Problem:   Acute renal failure  Active Problems:   DIABETES MELLITUS, TYPE II   CHF   COPD   Diarrhea    Time spent: *35 min    [facility]  [facility] Pager [phone]. If 7PM-7AM, please contact night-coverage at [email or web address], password [facility] 09/10/2012, 8:57 AM  LOS: 3 days

## 2012-09-10 NOTE — Care Management Note (Signed)
    Page 1 of 1   09/10/2012     2:32:32 PM   CARE MANAGEMENT NOTE 09/10/2012  Patient:  JON, KASPAREK   Account Number:  [id number]  Date Initiated:  09/08/2012  Documentation initiated by:  [facility]  Subjective/Objective Assessment:   62 year old female admitted with AKI.     Action/Plan:   From home.   Anticipated DC Date:  09/10/2012   Anticipated DC Plan:  HOME/SELF CARE      DC Planning Services  CM consult      Choice offered to / List presented to:             Status of service:  Completed, signed off Medicare Important Message given?  NA - LOS <3 / Initial given by admissions (If response is "NO", the following Medicare IM given date fields will be blank) Date Medicare IM given:   Date Additional Medicare IM given:    Discharge Disposition:  HOME/SELF CARE  Per UR Regulation:  Reviewed for med. necessity/level of care/duration of stay  If discussed at Long Length of Stay Meetings, dates discussed:    Comments:  09/10/12 Ami Mally RN,BSN NCM [phone] D/C HOME NO ORDERS OR NEEDS.

## 2012-09-11 LAB — UIFE/LIGHT CHAINS/TP QN, 24-HR UR
Free Kappa/Lambda Ratio: 6.63 ratio (ref 2.04–10.37)
Free Lambda Lt Chains,Ur: 1.87 mg/dL — ABNORMAL HIGH (ref 0.02–0.67)
Free Lt Chn Excr Rate: 198.4 mg/d
Time: 24 hours
Total Protein, Urine-Ur/day: 925 mg/d — ABNORMAL HIGH (ref 10–140)
Total Protein, Urine: 57.8 mg/dL

## 2012-09-11 LAB — STOOL CULTURE

## 2012-09-11 LAB — PROTEIN ELECTROPHORESIS, SERUM
Albumin ELP: 57.4 % (ref 55.8–66.1)
Alpha-2-Globulin: 17.6 % — ABNORMAL HIGH (ref 7.1–11.8)
Beta 2: 3.7 % (ref 3.2–6.5)
Beta Globulin: 5.8 % (ref 4.7–7.2)
Total Protein ELP: 6.3 g/dL (ref 6.0–8.3)

## 2012-09-27 ENCOUNTER — Telehealth: Payer: Self-pay | Admitting: Oncology

## 2012-09-27 ENCOUNTER — Other Ambulatory Visit: Payer: Self-pay | Admitting: *Deleted

## 2012-09-27 ENCOUNTER — Ambulatory Visit (HOSPITAL_BASED_OUTPATIENT_CLINIC_OR_DEPARTMENT_OTHER): Payer: No Typology Code available for payment source | Admitting: Lab

## 2012-09-27 ENCOUNTER — Telehealth: Payer: Self-pay | Admitting: *Deleted

## 2012-09-27 DIAGNOSIS — K922 Gastrointestinal hemorrhage, unspecified: Secondary | ICD-10-CM

## 2012-09-27 DIAGNOSIS — Q279 Congenital malformation of peripheral vascular system, unspecified: Secondary | ICD-10-CM

## 2012-09-27 DIAGNOSIS — D509 Iron deficiency anemia, unspecified: Secondary | ICD-10-CM

## 2012-09-27 LAB — IRON AND TIBC CHCC
Iron: 26 ug/dL — ABNORMAL LOW (ref 41–142)
TIBC: 313 ug/dL (ref 236–444)

## 2012-09-27 LAB — CBC WITH DIFFERENTIAL/PLATELET
BASO%: 1 % (ref 0.0–2.0)
Basophils Absolute: 0.1 10*3/uL (ref 0.0–0.1)
EOS%: 1.8 % (ref 0.0–7.0)
HGB: 10.9 g/dL — ABNORMAL LOW (ref 11.6–15.9)
MCH: 29.7 pg (ref 25.1–34.0)
MCV: 86.4 fL (ref 79.5–101.0)
MONO%: 6.5 % (ref 0.0–14.0)
RBC: 3.65 10*6/uL — ABNORMAL LOW (ref 3.70–5.45)
RDW: 14.6 % — ABNORMAL HIGH (ref 11.2–14.5)
lymph#: 0.9 10*3/uL (ref 0.9–3.3)

## 2012-09-27 LAB — FERRITIN CHCC: Ferritin: 36 ng/ml (ref 9–269)

## 2012-09-27 NOTE — Progress Notes (Signed)
Walk in patient.  C/O feeling confused and iron levels dropping.  Missed last appointments due to hospitalization.  Dr. Cyndie Chime notified and ordered labs for today.  Will have schedulers work on rescheduling missed appointment.

## 2012-09-27 NOTE — Telephone Encounter (Signed)
Message copied by Gala Romney on Thu Sep 27, 2012  2:54 PM ------      Message from: Levert Feinstein      Created: Thu Sep 27, 2012  2:39 PM       Call pt hemoglobin (red cell count) 10.9, stable compared with results done over the last 3 months - we will call when iron studies are back to see if she needs IV iron ------

## 2012-09-27 NOTE — Telephone Encounter (Signed)
Pt walked in and was seen by triage. Pt sent back to lb and given appt for 8/11 w/JG.

## 2012-09-27 NOTE — Telephone Encounter (Signed)
Notified pt Hgb 10.9, stable compared with results over last 3 months.  Office will call when iron studies are back to see if IV iron will be needed.  Pt verbalized understanding.

## 2012-09-27 NOTE — Telephone Encounter (Signed)
, °

## 2012-09-28 ENCOUNTER — Telehealth: Payer: Self-pay | Admitting: *Deleted

## 2012-09-28 NOTE — Telephone Encounter (Signed)
Message copied by Gala Romney on Fri Sep 28, 2012  4:11 PM ------      Message from: Levert Feinstein      Created: Thu Sep 27, 2012  3:28 PM       Call tomorrow - iron levels slightly low - we can schedule IV iron - not urgent - find day that works & let me know ------

## 2012-09-28 NOTE — Telephone Encounter (Signed)
Notified pt per Dr. Cyndie Chime that iron levels slightly low and need to schedule IV iron.  Pt verbalized understanding and states anytime is fine with her.  Informed pt that scheduler will call and give her date/time next week.  Pt verbalized understanding. POF complete.

## 2012-10-01 ENCOUNTER — Ambulatory Visit: Payer: No Typology Code available for payment source | Admitting: Oncology

## 2012-10-02 ENCOUNTER — Ambulatory Visit (HOSPITAL_BASED_OUTPATIENT_CLINIC_OR_DEPARTMENT_OTHER): Payer: No Typology Code available for payment source | Admitting: Oncology

## 2012-10-02 ENCOUNTER — Telehealth: Payer: Self-pay | Admitting: *Deleted

## 2012-10-02 VITALS — BP 198/57 | HR 72 | Temp 98.0°F | Resp 18 | Ht <= 58 in | Wt 139.3 lb

## 2012-10-02 DIAGNOSIS — Q279 Congenital malformation of peripheral vascular system, unspecified: Secondary | ICD-10-CM

## 2012-10-02 DIAGNOSIS — D539 Nutritional anemia, unspecified: Secondary | ICD-10-CM

## 2012-10-02 DIAGNOSIS — D638 Anemia in other chronic diseases classified elsewhere: Secondary | ICD-10-CM

## 2012-10-02 DIAGNOSIS — Q2730 Arteriovenous malformation, site unspecified: Secondary | ICD-10-CM

## 2012-10-02 DIAGNOSIS — K922 Gastrointestinal hemorrhage, unspecified: Secondary | ICD-10-CM

## 2012-10-02 NOTE — Telephone Encounter (Signed)
Per staff message and POF I have scheduled appts.  JMW  

## 2012-10-02 NOTE — Progress Notes (Signed)
Hematology and Oncology Follow Up Visit  [name] [id number] 1951-01-08 62 y.o. 10/02/2012 8:26 PM   Principle Diagnosis: Encounter Diagnoses  Name Primary?  . ANEMIA, CHRONIC Yes  . AVM (arteriovenous malformation)   . GI bleed      Interim History:  Followup visit for this 62 year old woman initially evaluated in this office in 2007 by Dr.Odogwu for anemia. She has multiple medical problems. Data  most pertinent to anemia includes previous GI bleeds thought to be secondary to chronic GI blood loss from arteriovenous malformations of the small bowel suspected at time of capsule endoscopy in August of 2005 and again on enteroscopy done 01/26/2012 when active bleeding was seen in the duodenum and gastric body. Laser coagulation of the lesion in the stomach was done... She has required intermittent parenteral iron infusions in the past as well as blood transfusions. She has a major component of anemia related to chronic kidney disease stage IV. Recent creatinine done on 09/10/2012 was 4.4. She was hospitalized at that time. Additional studies included a 24-hour urine collection which showed 925 mg of nonselective proteinuria with no monoclonal light chains seen on immunofixation electrophoresis. Serum immunoglobulins were all normal. IFE showed an "area of slightly restricted mobility in the IgG kappa lane" which is nonspecific. Serum free light chain analysis was done and showed borderline increase in the kappa/lambda ratio of 1.89 (0.26-1.65). Creatinine clearance was 10 mL per minute. She also has an anemia of chronic disease pattern with decreased iron, TIBC, and percent saturation. Iron studies done in anticipation of her visit here today on 09/27/2012 with iron 26, TIBC 313, percent saturation 8, ferritin 36.  She has had diabetes for over 20 years. She is on oral agents. She is hypertensive. She has cerebrovascular disease and is status post left carotid endarterectomy about 15 years ago.  Hyperlipidemia. Previous MI when she was in her late 39s. She denied previous stroke. She believes she has hypothyroidism but is not currently on medication. She was told she has liver trouble but her recent liver functions were normal except for decreased albumin.  She is accompanied by her daughter today. Daughter reports that her mother has become increasingly confused with poor memory. She is complaining of intermittent abdominal pain primarily epigastric and nausea for the last 2-3 weeks.  She denies any hematochezia or melena.  She is smoking 2-3 packs of cigarettes daily but daughter says she just likes one after another and takes a few puffs and then extinguishes it.  Pertinent family history: She had 2 brothers one died of bladder cancer she had one sister who died of lung cancer metastatic to brain  Pertinent social history: She has a daughter age 36 and a son age 18. Ongoing tobacco use. No alcohol.  Additional prior surgeries not mentioned above: Cholecystectomy, appendectomy, left shoulder surgery, C-section.   Medications: reviewed  Allergies: No Known Allergies  Review of Systems: Constitutional:   Easy fatigue  Respiratory:No cough or dyspnea  Cardiovascular:  No chest pain or palpitations  Gastrointestinal: See above  Genito-Urinary: No urinary tract symptoms  Musculoskeletal:Arthritis pain primarily left shoulder  Neurologic:Recent deterioration in memory  Skin:No rash or ecchymosis  Remaining ROS negative.  Physical Exam: Blood pressure 198/57, pulse 72, temperature 98 F (36.7 C), temperature source Oral, resp. rate 18, height 4\' 9"  (1.448 m), weight 139 lb 4.8 oz (63.186 kg). Wt Readings from Last 3 Encounters:  10/02/12 139 lb 4.8 oz (63.186 kg)  09/10/12 143 lb 15.4 oz (65.3  kg)  05/14/12 140 lb (63.504 kg)     General appearance: Petit Caucasian woman  HENNT: She is missing multiple teeth  Lymph nodes: No lymphadenopathy  Breasts: Lungs:Clear to  auscultation resonant to percussion  Heart:Regular rhythm no murmur  Abdomen:Soft, tender in the epigastric region, no mass, no organomegaly  Extremities:No edema, no calf tenderness  Musculoskeletal:No joint deformities  GU: Vascular:Bilateral carotid bruits. Large scar left neck status post previous carotid surgery. Neurologic: She is alert and oriented. She could not do simple calculations. She could spell the word house forwards but not backwards. Motor strength 5 over 5. Reflexes 1+ symmetric. Moderate to severe decrease in vibration sensation over the fingertips. Skin:  Lab Results: Lab Results  Component Value Date   WBC 7.2 09/27/2012   HGB 10.9* 09/27/2012   HCT 31.6* 09/27/2012   MCV 86.4 09/27/2012   PLT 342 09/27/2012     Chemistry      Component Value Date/Time   NA 137 09/10/2012 0451   NA 135* 12/27/2011 0923   K 4.2 09/10/2012 0451   K 4.5 12/27/2011 0923   CL 102 09/10/2012 0451   CL 98 12/27/2011 0923   CO2 29 09/10/2012 0451   CO2 28 12/27/2011 0923   BUN 57* 09/10/2012 0451   BUN 60.0* 12/27/2011 0923   CREATININE 4.40* 09/10/2012 0451   CREATININE 4.77* 09/09/2012 1332   CREATININE 1.5* 12/27/2011 0923      Component Value Date/Time   CALCIUM 9.0 09/10/2012 0451   CALCIUM 10.1 12/27/2011 0923   ALKPHOS 86 09/09/2012 0515   AST 12 09/09/2012 0515   ALT 5 09/09/2012 0515   BILITOT 0.1* 09/09/2012 0515       Radiological Studies: Ct Abdomen Pelvis Wo Contrast  09/07/2012   *RADIOLOGY REPORT*  Clinical Data: Abdominal pain  CT ABDOMEN AND PELVIS WITHOUT CONTRAST  Technique:  Multidetector CT imaging of the abdomen and pelvis was performed following the standard protocol without intravenous contrast.  Comparison: 11/03/2011  Findings: Minimal patchy dependent bibasilar atelectasis.  No pleural effusion. Cholecystectomy clips noted.  Unenhanced liver, adrenal glands, kidneys, spleen, and pancreas are normal.  Minimal nonspecific bilateral perinephric stranding noted without  hydronephrosis or radiopaque renal or ureteral calculus.  Moderate atheromatous aortic calcification noted without aneurysm. Uterus and ovaries are normal.  No bowel wall thickening or focal segmental dilatation.  The appendix is not identified but there is no secondary evidence for acute appendicitis. Bones are osteopenic, which may mask subtle fracture.  No compression deformity is identified.  Schmorl's node formation incidentally noted. Concentric disc bulge is re-identified L4-L5 and L5-S1.  IMPRESSION: No acute intra-abdominal or pelvic pathology.   Original Report Authenticated By: Christiana Pellant, M.D.   Dg Chest 2 View  09/07/2012   *RADIOLOGY REPORT*  Clinical Data: Upper abdominal pain and chest tightness.  Nausea and diarrhea.  CHEST - 2 VIEW  Comparison: Chest radiograph performed 02/08/2012  Findings: The lungs are well-aerated.  Chronically increased interstitial markings and peribronchial thickening are seen. Previously noted vascular congestion has improved.  There is no evidence of focal opacification, pleural effusion or pneumothorax.  The heart is mildly enlarged.  No acute osseous abnormalities are seen.  Clips are noted within the right upper quadrant, reflecting prior cholecystectomy.  IMPRESSION: Mild chronic lung changes noted; no acute cardiopulmonary process seen.  Mild cardiomegaly noted.   Original Report Authenticated By: Tonia Ghent, M.D.   US Renal  09/08/2012   *RADIOLOGY REPORT*  Clinical Data: Renal insufficiency.  History of vascular disease.  RENAL/URINARY TRACT ULTRASOUND COMPLETE  Comparison:  CT abdomen and pelvis 09/08/2012 and 12/10/2005.  Findings:  Right Kidney:   Measures 12.1 cm.  No stone, mass or hydronephrosis.  Defect in the cortex of the superior pole of the right kidney is identified and most consistent with scar as seen on the prior CT.  Left Kidney:  Measures 9.4 cm.  No stone, mass or hydronephrosis.  Bladder:  Unremarkable.  IMPRESSION: Negative for  hydronephrosis or other acute abnormality.  Scar upper pole right kidney, unchanged.   Original Report Authenticated By: Holley Dexter, M.D.    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  At present her hemoglobin is at her baseline of approximately 11 g with MCV of 89 and no active bleeding. Ferritin is low normal and she is taking iron supplements and I encouraged her to continue these. If her hemoglobin falls below 10 g, she would be a candidate for erythropoietin stimulating agents. I will leave this to the discretion of her nephrologist. I have not scheduled a formal followup visit in this office at this time. I anticipate she will continue to need intermittent blood transfusions periodically secondary to periodic GI blood loss from the AVMs.      CC:. [facility]; Dr Fayrene Fearing Deterding; Dr Cheryle Horsfall, MD 8/12/20148:26 PM

## 2012-10-03 ENCOUNTER — Ambulatory Visit: Payer: No Typology Code available for payment source

## 2012-10-05 ENCOUNTER — Telehealth: Payer: Self-pay | Admitting: Dietician

## 2012-10-11 ENCOUNTER — Encounter: Payer: Self-pay | Admitting: Cardiology

## 2012-11-14 ENCOUNTER — Encounter (INDEPENDENT_AMBULATORY_CARE_PROVIDER_SITE_OTHER): Payer: No Typology Code available for payment source

## 2012-11-14 DIAGNOSIS — I6529 Occlusion and stenosis of unspecified carotid artery: Secondary | ICD-10-CM

## 2013-04-20 ENCOUNTER — Encounter: Payer: Self-pay | Admitting: Oncology

## 2013-05-15 ENCOUNTER — Ambulatory Visit (INDEPENDENT_AMBULATORY_CARE_PROVIDER_SITE_OTHER): Payer: No Typology Code available for payment source | Admitting: Cardiology

## 2013-05-15 ENCOUNTER — Encounter: Payer: Self-pay | Admitting: Cardiology

## 2013-05-15 VITALS — BP 160/80 | HR 58 | Ht <= 58 in | Wt 145.8 lb

## 2013-05-15 DIAGNOSIS — I2510 Atherosclerotic heart disease of native coronary artery without angina pectoris: Secondary | ICD-10-CM

## 2013-05-15 DIAGNOSIS — I6529 Occlusion and stenosis of unspecified carotid artery: Secondary | ICD-10-CM

## 2013-05-15 NOTE — Progress Notes (Signed)
HPI The patient presents for followup of multiple cardiovascular risk factors and known coronary disease. Since I last saw her she said no new cardiovascular problems. She's had cauterization of an AVM in the past.  At the last visit we had the approval of GI to restart ASA.  She has been on this.  The patient denies any new symptoms such as chest discomfort, neck or arm discomfort. There has been no new shortness of breath, PND or orthopnea. There have been no reported palpitations, presyncope or syncope.  She unfortunately is still smoking.  She does some light household chores.   No Known Allergies  Current Outpatient Prescriptions  Medication Sig Dispense Refill  . acetaminophen (TYLENOL) 500 MG tablet Take 500 mg by mouth every 6 (six) hours as needed. For pain      . albuterol (PROVENTIL HFA;VENTOLIN HFA) 108 (90 BASE) MCG/ACT inhaler Inhale 2 puffs into the lungs every 6 (six) hours as needed. For shortness of breath       . ALPRAZolam (XANAX) 0.25 MG tablet Take 0.25 mg by mouth 2 (two) times daily.       [name] amLODipine (NORVASC) 10 MG tablet Take 10 mg by mouth daily.       . calcium carbonate (OS-CAL) 600 MG TABS Take 600 mg by mouth 3 (three) times daily with meals.       . donepezil (ARICEPT) 10 MG tablet Take 10 mg by mouth at bedtime.      . Ferrous Sulfate (IRON) 325 (65 FE) MG TABS Take 1 tablet by mouth 3 (three) times daily.       . fish oil-omega-3 fatty acids 1000 MG capsule Take 1 g by mouth 3 (three) times daily. Three times daily      . fluticasone (FLONASE) 50 MCG/ACT nasal spray Place 2 sprays into the nose daily as needed. For allergies       . furosemide (LASIX) 80 MG tablet Take 0.5 tablets (40 mg total) by mouth daily.  30 tablet  0  . losartan (COZAAR) 50 MG tablet Take 50 mg by mouth daily.      . Multiple Vitamin (MULTIVITAMIN WITH MINERALS) TABS Take 1 tablet by mouth daily.      . pantoprazole (PROTONIX) 40 MG tablet Take 1 tablet (40 mg total) by mouth daily.   30 tablet  3  . sertraline (ZOLOFT) 100 MG tablet Take 200 mg by mouth daily.       . [DISCONTINUED] loratadine (CLARITIN) 10 MG tablet Take 10 mg by mouth daily.        . [DISCONTINUED] metoprolol tartrate (LOPRESSOR) 25 MG tablet Take 25 mg by mouth daily.        No current facility-administered medications for this visit.    Past Medical History  Diagnosis Date  . Peripheral vascular disease     status post left common iliac and external iliac artery stents  . Cerebrovascular disease     Dopplers 3/14:  RICA 60-79% (high end of range -  Stable), LICA 67-20%, mod R subclavian stenosis, mod R prox CCA stenosis;  F/u 6 mos  . Diabetes mellitus   . COPD (chronic obstructive pulmonary disease)   . Hyperlipidemia   . Depression   . Diastolic congestive heart failure   . Hypertension   . Renal artery stenosis     status post  . CAD (coronary artery disease)     50% left main stenosis, ostial 60% LAD stenosis, 30% ramus intermediate  stenosis, occluded right artery with left-to-right collaterals  . Iron deficiency anemia   . Diabetic retinopathy   . 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 Dieaulafoy's lesion 01/25/2012    Past Surgical History  Procedure Laterality Date  . Carotid endarterectomy    . Cholecystectomy    . Appendectomy    . Tonsillectomy    . Cesarean section    . Shoulder arthroscopy    . Iliac artery stent      left  . Enteroscopy  01/26/2012    Procedure: ENTEROSCOPY;  Surgeon: Gatha Mayer, MD;  Location: [facility];  Service: Endoscopy;  Laterality: N/A;    ROS:  GERD.  Otherwise as stated in the HPI and negative for all other systems.  PHYSICAL EXAM BP 160/80  Pulse 58  Ht 4\' 9"  (1.448 m)  Wt 145 lb 12.8 oz (66.134 kg)  BMI 31.54 kg/m2 GENERAL:  Well appearing HEENT:  Pupils equal round and reactive, fundi not visualized, oral mucosa unremarkable, poor dentition NECK:   No jugular venous distention, waveform within normal limits, carotid upstroke brisk and symmetric, bilateral bruits, left CEA scar no thyromegaly LYMPHATICS:  No cervical, inguinal adenopathy LUNGS:  Clear to auscultation bilaterally BACK:  No CVA tenderness CHEST:  Unremarkable HEART:  PMI not displaced or sustained,S1 and S2 within normal limits, no S3, no S4, no clicks, no rubs, soft apical systolic murmur ABD:  Flat, positive bowel sounds normal in frequency in pitch, no bruits, no rebound, no guarding, no midline pulsatile mass, no hepatomegaly, no splenomegaly EXT:  2 plus pulses upper absent DP/PT, trace edema, no cyanosis no clubbing  EKG  Sinus rhythm, rate 58, axis within normal limits, intervals within normal limits, inferior and anterolateral T wave inversion mildly more pronounced than his previous EKG.  05/15/2013  ASSESSMENT AND PLAN  CAD:  The patient has no new sypmtoms.  No further cardiovascular testing is indicated.  We will continue with aggressive risk reduction and meds as listed.  PVD:  She has no active claudication. No change in therapy is indicated.  HTN:  The blood pressure is elevated but typically at home in the 150 range.  She is on multiple meds.  HR would preclude increasing the beta blockers.  She has CKD so I don't want to go up on the ARB.  She will remain on the meds as lsited.   DIASTOLIC HEART FAILURE:   She seems to be euvolemic.  At this point, no change in therapy is indicated.   HYPERLIPIDEMIA:  This is followed by her PCP  TOBACCO ABUSE:  I again discussed the need to stop smoking.   CAROTID STENOSIS:  She is due to have follow up of this now and we will schedule this.

## 2013-05-15 NOTE — Patient Instructions (Addendum)

## 2013-05-20 ENCOUNTER — Encounter: Payer: Self-pay | Admitting: Cardiology

## 2013-05-20 ENCOUNTER — Ambulatory Visit (HOSPITAL_COMMUNITY): Payer: No Typology Code available for payment source | Attending: Cardiology | Admitting: Cardiology

## 2013-05-20 DIAGNOSIS — R42 Dizziness and giddiness: Secondary | ICD-10-CM

## 2013-05-20 DIAGNOSIS — I6529 Occlusion and stenosis of unspecified carotid artery: Secondary | ICD-10-CM

## 2013-05-20 NOTE — Progress Notes (Signed)
Carotid duplex complete 

## 2013-06-04 ENCOUNTER — Telehealth: Payer: Self-pay | Admitting: *Deleted

## 2013-06-04 NOTE — Telephone Encounter (Signed)
Follow up as suggested. Call Ms. [name] with the results Stable 60-79% stenosis bilaterally - f/u in 6 mons

## 2013-06-04 NOTE — Telephone Encounter (Signed)
NA at home number.

## 2013-06-10 NOTE — Telephone Encounter (Signed)
NA at home number.

## 2013-06-20 ENCOUNTER — Encounter: Payer: Self-pay | Admitting: *Deleted

## 2013-06-20 NOTE — Telephone Encounter (Signed)
Letter of results mailed to pt's home address. 

## 2014-11-07 ENCOUNTER — Inpatient Hospital Stay (HOSPITAL_COMMUNITY): Payer: Self-pay

## 2014-11-07 ENCOUNTER — Emergency Department (HOSPITAL_COMMUNITY): Payer: Self-pay

## 2014-11-07 ENCOUNTER — Encounter (HOSPITAL_COMMUNITY): Payer: Self-pay | Admitting: Emergency Medicine

## 2014-11-07 ENCOUNTER — Inpatient Hospital Stay (HOSPITAL_COMMUNITY)
Admission: EM | Admit: 2014-11-07 | Discharge: 2014-11-09 | DRG: 193 | Disposition: A | Discharge status: Home / Self Care | Payer: Self-pay | Attending: Family Medicine | Admitting: Family Medicine

## 2014-11-07 DIAGNOSIS — I701 Atherosclerosis of renal artery: Secondary | ICD-10-CM | POA: Diagnosis present

## 2014-11-07 DIAGNOSIS — Z8673 Personal history of transient ischemic attack (TIA), and cerebral infarction without residual deficits: Secondary | ICD-10-CM

## 2014-11-07 DIAGNOSIS — Z8249 Family history of ischemic heart disease and other diseases of the circulatory system: Secondary | ICD-10-CM

## 2014-11-07 DIAGNOSIS — D509 Iron deficiency anemia, unspecified: Secondary | ICD-10-CM | POA: Diagnosis present

## 2014-11-07 DIAGNOSIS — Z9889 Other specified postprocedural states: Secondary | ICD-10-CM

## 2014-11-07 DIAGNOSIS — K5520 Angiodysplasia of colon without hemorrhage: Secondary | ICD-10-CM | POA: Diagnosis present

## 2014-11-07 DIAGNOSIS — I739 Peripheral vascular disease, unspecified: Secondary | ICD-10-CM | POA: Diagnosis present

## 2014-11-07 DIAGNOSIS — I2510 Atherosclerotic heart disease of native coronary artery without angina pectoris: Secondary | ICD-10-CM | POA: Diagnosis present

## 2014-11-07 DIAGNOSIS — Z9049 Acquired absence of other specified parts of digestive tract: Secondary | ICD-10-CM | POA: Diagnosis present

## 2014-11-07 DIAGNOSIS — E875 Hyperkalemia: Secondary | ICD-10-CM | POA: Diagnosis not present

## 2014-11-07 DIAGNOSIS — E785 Hyperlipidemia, unspecified: Secondary | ICD-10-CM | POA: Diagnosis present

## 2014-11-07 DIAGNOSIS — J189 Pneumonia, unspecified organism: Principal | ICD-10-CM | POA: Diagnosis present

## 2014-11-07 DIAGNOSIS — I509 Heart failure, unspecified: Secondary | ICD-10-CM

## 2014-11-07 DIAGNOSIS — E11319 Type 2 diabetes mellitus with unspecified diabetic retinopathy without macular edema: Secondary | ICD-10-CM | POA: Diagnosis present

## 2014-11-07 DIAGNOSIS — E119 Type 2 diabetes mellitus without complications: Secondary | ICD-10-CM

## 2014-11-07 DIAGNOSIS — J449 Chronic obstructive pulmonary disease, unspecified: Secondary | ICD-10-CM | POA: Diagnosis present

## 2014-11-07 DIAGNOSIS — F17210 Nicotine dependence, cigarettes, uncomplicated: Secondary | ICD-10-CM | POA: Diagnosis present

## 2014-11-07 DIAGNOSIS — Z7982 Long term (current) use of aspirin: Secondary | ICD-10-CM

## 2014-11-07 DIAGNOSIS — R0902 Hypoxemia: Secondary | ICD-10-CM | POA: Diagnosis present

## 2014-11-07 DIAGNOSIS — F329 Major depressive disorder, single episode, unspecified: Secondary | ICD-10-CM | POA: Diagnosis present

## 2014-11-07 DIAGNOSIS — I5033 Acute on chronic diastolic (congestive) heart failure: Secondary | ICD-10-CM | POA: Diagnosis present

## 2014-11-07 DIAGNOSIS — Z79899 Other long term (current) drug therapy: Secondary | ICD-10-CM

## 2014-11-07 DIAGNOSIS — E872 Acidosis: Secondary | ICD-10-CM | POA: Diagnosis present

## 2014-11-07 DIAGNOSIS — I10 Essential (primary) hypertension: Secondary | ICD-10-CM | POA: Diagnosis present

## 2014-11-07 DIAGNOSIS — J441 Chronic obstructive pulmonary disease with (acute) exacerbation: Secondary | ICD-10-CM | POA: Insufficient documentation

## 2014-11-07 LAB — I-STAT ARTERIAL BLOOD GAS, ED
ACID-BASE DEFICIT: 6 mmol/L — AB (ref 0.0–2.0)
BICARBONATE: 20.9 meq/L (ref 20.0–24.0)
O2 SAT: 100 %
PO2 ART: 207 mmHg — AB (ref 80.0–100.0)
Patient temperature: 97.2
TCO2: 22 mmol/L (ref 0–100)
pCO2 arterial: 46.1 mmHg — ABNORMAL HIGH (ref 35.0–45.0)
pH, Arterial: 7.261 — ABNORMAL LOW (ref 7.350–7.450)

## 2014-11-07 LAB — CBC WITH DIFFERENTIAL/PLATELET
Basophils Absolute: 0.1 10*3/uL (ref 0.0–0.1)
Basophils Relative: 1 %
EOS ABS: 0.2 10*3/uL (ref 0.0–0.7)
Eosinophils Relative: 1 %
HCT: 35.3 % — ABNORMAL LOW (ref 36.0–46.0)
Hemoglobin: 11.2 g/dL — ABNORMAL LOW (ref 12.0–15.0)
LYMPHS ABS: 1.2 10*3/uL (ref 0.7–4.0)
LYMPHS PCT: 8 %
MCH: 30.1 pg (ref 26.0–34.0)
MCHC: 31.7 g/dL (ref 30.0–36.0)
MCV: 94.9 fL (ref 78.0–100.0)
MONOS PCT: 7 %
Monocytes Absolute: 0.9 10*3/uL (ref 0.1–1.0)
NEUTROS PCT: 83 %
Neutro Abs: 11.4 10*3/uL — ABNORMAL HIGH (ref 1.7–7.7)
Platelets: 325 10*3/uL (ref 150–400)
RBC: 3.72 MIL/uL — AB (ref 3.87–5.11)
RDW: 17.7 % — ABNORMAL HIGH (ref 11.5–15.5)
WBC: 13.7 10*3/uL — ABNORMAL HIGH (ref 4.0–10.5)

## 2014-11-07 LAB — I-STAT TROPONIN, ED: Troponin i, poc: 0.01 ng/mL (ref 0.00–0.08)

## 2014-11-07 LAB — COMPREHENSIVE METABOLIC PANEL
ALT: 11 U/L — ABNORMAL LOW (ref 14–54)
ANION GAP: 10 (ref 5–15)
AST: 22 U/L (ref 15–41)
Albumin: 2.9 g/dL — ABNORMAL LOW (ref 3.5–5.0)
Alkaline Phosphatase: 125 U/L (ref 38–126)
BUN: 41 mg/dL — ABNORMAL HIGH (ref 6–20)
CO2: 20 mmol/L — AB (ref 22–32)
CREATININE: 1.65 mg/dL — AB (ref 0.44–1.00)
Calcium: 9.6 mg/dL (ref 8.9–10.3)
Chloride: 108 mmol/L (ref 101–111)
GFR calc non Af Amer: 32 mL/min — ABNORMAL LOW (ref >60)
GFR, EST AFRICAN AMERICAN: 37 mL/min — AB (ref >60)
Glucose, Bld: 207 mg/dL — ABNORMAL HIGH (ref 65–99)
Potassium: 4.7 mmol/L (ref 3.5–5.1)
SODIUM: 138 mmol/L (ref 135–145)
TOTAL PROTEIN: 6.8 g/dL (ref 6.5–8.1)
Total Bilirubin: 0.7 mg/dL (ref 0.3–1.2)

## 2014-11-07 LAB — GLUCOSE, CAPILLARY
GLUCOSE-CAPILLARY: 263 mg/dL — AB (ref 65–99)
GLUCOSE-CAPILLARY: 178 mg/dL — AB (ref 65–99)
Glucose-Capillary: 181 mg/dL — ABNORMAL HIGH (ref 65–99)

## 2014-11-07 LAB — BRAIN NATRIURETIC PEPTIDE: B NATRIURETIC PEPTIDE 5: 2634.2 pg/mL — AB (ref 0.0–100.0)

## 2014-11-07 MED ORDER — SERTRALINE HCL 100 MG PO TABS
200.0000 mg | ORAL_TABLET | Freq: Every day | ORAL | Status: DC
  Administered 2014-11-07 – 2014-11-09 (×3): 200 mg via ORAL
  Filled 2014-11-07: qty 2
  Filled 2014-11-07: qty 4
  Filled 2014-11-07: qty 2

## 2014-11-07 MED ORDER — INSULIN ASPART 100 UNIT/ML ~~LOC~~ SOLN
0.0000 [IU] | Freq: Three times a day (TID) | SUBCUTANEOUS | Status: DC
  Administered 2014-11-07: 8 [IU] via SUBCUTANEOUS
  Administered 2014-11-07: 3 [IU] via SUBCUTANEOUS
  Administered 2014-11-08: 8 [IU] via SUBCUTANEOUS
  Administered 2014-11-08: 2 [IU] via SUBCUTANEOUS
  Administered 2014-11-08: 5 [IU] via SUBCUTANEOUS
  Administered 2014-11-09: 2 [IU] via SUBCUTANEOUS
  Administered 2014-11-09: 5 [IU] via SUBCUTANEOUS

## 2014-11-07 MED ORDER — FERROUS SULFATE 325 (65 FE) MG PO TABS
325.0000 mg | ORAL_TABLET | Freq: Three times a day (TID) | ORAL | Status: DC
  Administered 2014-11-07 – 2014-11-09 (×7): 325 mg via ORAL
  Filled 2014-11-07 (×7): qty 1

## 2014-11-07 MED ORDER — DONEPEZIL HCL 10 MG PO TABS: 10.0000 mg | ORAL_TABLET | Freq: Every day | ORAL | Status: DC

## 2014-11-07 MED ORDER — ALPRAZOLAM 0.25 MG PO TABS: 0.2500 mg | ORAL_TABLET | Freq: Two times a day (BID) | ORAL | Status: DC

## 2014-11-07 MED ORDER — LEVOFLOXACIN IN D5W 250 MG/50ML IV SOLN
250.0000 mg | Freq: Once | INTRAVENOUS | Status: AC
  Administered 2014-11-07: 250 mg via INTRAVENOUS
  Filled 2014-11-07: qty 50

## 2014-11-07 MED ORDER — IPRATROPIUM-ALBUTEROL 0.5-2.5 (3) MG/3ML IN SOLN: 3.0000 mL | Freq: Four times a day (QID) | RESPIRATORY_TRACT | Status: DC | PRN

## 2014-11-07 MED ORDER — ALBUTEROL SULFATE (2.5 MG/3ML) 0.083% IN NEBU: 2.5000 mg | INHALATION_SOLUTION | RESPIRATORY_TRACT | Status: DC | PRN

## 2014-11-07 MED ORDER — ONDANSETRON HCL 4 MG/2ML IJ SOLN
4.0000 mg | Freq: Once | INTRAMUSCULAR | Status: AC
  Administered 2014-11-07: 4 mg via INTRAVENOUS
  Filled 2014-11-07: qty 2

## 2014-11-07 MED ORDER — METHYLPREDNISOLONE SODIUM SUCC 125 MG IJ SOLR
125.0000 mg | Freq: Once | INTRAMUSCULAR | Status: AC
  Administered 2014-11-07: 125 mg via INTRAVENOUS
  Filled 2014-11-07: qty 2

## 2014-11-07 MED ORDER — SODIUM CHLORIDE 0.9 % IJ SOLN
3.0000 mL | Freq: Two times a day (BID) | INTRAMUSCULAR | Status: DC
  Administered 2014-11-07 – 2014-11-09 (×4): 3 mL via INTRAVENOUS

## 2014-11-07 MED ORDER — PANTOPRAZOLE SODIUM 40 MG PO TBEC
40.0000 mg | DELAYED_RELEASE_TABLET | Freq: Every day | ORAL | Status: DC
  Administered 2014-11-07 – 2014-11-09 (×3): 40 mg via ORAL
  Filled 2014-11-07: qty 1
  Filled 2014-11-07: qty 2
  Filled 2014-11-07: qty 1

## 2014-11-07 MED ORDER — LEVOFLOXACIN IN D5W 500 MG/100ML IV SOLN
500.0000 mg | Freq: Once | INTRAVENOUS | Status: AC
  Administered 2014-11-07: 500 mg via INTRAVENOUS
  Filled 2014-11-07: qty 100

## 2014-11-07 MED ORDER — ENOXAPARIN SODIUM 30 MG/0.3ML ~~LOC~~ SOLN
30.0000 mg | SUBCUTANEOUS | Status: DC
  Administered 2014-11-07 – 2014-11-09 (×3): 30 mg via SUBCUTANEOUS
  Filled 2014-11-07 (×4): qty 0.3

## 2014-11-07 MED ORDER — IRON 325 (65 FE) MG PO TABS: 1.0000 | ORAL_TABLET | Freq: Three times a day (TID) | ORAL | Status: DC

## 2014-11-07 MED ORDER — LOSARTAN POTASSIUM 50 MG PO TABS
50.0000 mg | ORAL_TABLET | Freq: Every day | ORAL | Status: DC
  Administered 2014-11-07 – 2014-11-09 (×3): 50 mg via ORAL
  Filled 2014-11-07 (×3): qty 1

## 2014-11-07 MED ORDER — LEVOFLOXACIN 500 MG PO TABS
500.0000 mg | ORAL_TABLET | ORAL | Status: DC
  Administered 2014-11-09: 500 mg via ORAL
  Filled 2014-11-07: qty 1

## 2014-11-07 MED ORDER — NITROGLYCERIN IN D5W 200-5 MCG/ML-% IV SOLN
10.0000 ug/min | INTRAVENOUS | Status: DC
  Administered 2014-11-07: 10 ug/min via INTRAVENOUS
  Filled 2014-11-07: qty 250

## 2014-11-07 MED ORDER — ASPIRIN EC 81 MG PO TBEC
81.0000 mg | DELAYED_RELEASE_TABLET | Freq: Every day | ORAL | Status: DC
  Administered 2014-11-07 – 2014-11-09 (×3): 81 mg via ORAL
  Filled 2014-11-07 (×3): qty 1

## 2014-11-07 MED ORDER — PREDNISONE 20 MG PO TABS
50.0000 mg | ORAL_TABLET | Freq: Every day | ORAL | Status: DC
  Administered 2014-11-08 – 2014-11-09 (×2): 50 mg via ORAL
  Filled 2014-11-07: qty 2
  Filled 2014-11-07: qty 3
  Filled 2014-11-07: qty 1
  Filled 2014-11-07: qty 2
  Filled 2014-11-07: qty 1

## 2014-11-07 MED ORDER — FUROSEMIDE 10 MG/ML IJ SOLN
80.0000 mg | Freq: Once | INTRAMUSCULAR | Status: AC
  Administered 2014-11-07: 80 mg via INTRAVENOUS
  Filled 2014-11-07: qty 8

## 2014-11-07 MED ORDER — LEVOFLOXACIN 750 MG PO TABS: 750.0000 mg | ORAL_TABLET | ORAL | Status: DC

## 2014-11-07 MED ORDER — AMLODIPINE BESYLATE 10 MG PO TABS
10.0000 mg | ORAL_TABLET | Freq: Every day | ORAL | Status: DC
  Administered 2014-11-07 – 2014-11-09 (×3): 10 mg via ORAL
  Filled 2014-11-07: qty 2
  Filled 2014-11-07: qty 1
  Filled 2014-11-07: qty 2
  Filled 2014-11-07: qty 1

## 2014-11-07 NOTE — Progress Notes (Signed)
Patient refused CPAP for tonight. Patient in no distress. Tolerating 4L [place] sat 96%. Rt will continue to monitor as needed.

## 2014-11-07 NOTE — Progress Notes (Signed)
Patient seen and evaluated earlier the same by my associate. Please refer to H&P for details regarding assessment and plan.  Gen.: Patient in no acute distress Cardiovascular: S1 and S2 present, no rubs Pulmonary: Equal chest rise, no audible wheezes, some rales mild  Plan on reassessing next a.m. Or sooner should there be an acute change in medical condition    VEGA, [facility]

## 2014-11-07 NOTE — ED Provider Notes (Signed)
CSN: [id number]     Arrival date & time 11/07/14  0307 History   This chart was scribed for Julianne Rice, MD by Forrestine Him, ED Scribe. This patient was seen in room A01C/A01C and the patient's care was started 3:10 AM.   Chief Complaint  Patient presents with  . Shortness of Breath   The history is provided by the patient. No language interpreter was used.    HPI Comments: [name] brought in by EMS is a 64 y.o. female with a PMHx of DM, COPD, CAD, HTN, and CVA who presents to the Emergency Department complaining of ongoing shortness of breath x 2-3 days; worsened this evening. Associated productive cough also reported. Denies missing any doses of daily medications. 0.5 of Atrovent/5 Albuterol given en route to department. No recent fever, chills, nausea, or vomiting. No known allergies to medications.  Past Medical History  Diagnosis Date  . Peripheral vascular disease     status post left common iliac and external iliac artery stents  . Cerebrovascular disease     Dopplers 3/14:  RICA 60-79% (high end of range -  Stable), LICA 56-38%, mod R subclavian stenosis, mod R prox CCA stenosis;  F/u 6 mos  . Diabetes mellitus   . COPD (chronic obstructive pulmonary disease)   . Hyperlipidemia   . Depression   . Diastolic congestive heart failure   . Hypertension   . Renal artery stenosis     status post  . CAD (coronary artery disease)     50% left main stenosis, ostial 60% LAD stenosis, 30% ramus intermediate stenosis, occluded right artery with left-to-right collaterals  . Iron deficiency anemia   . Diabetic retinopathy   . 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 Dieaulafoy's lesion 01/25/2012   Past Surgical History  Procedure Laterality Date  . Carotid endarterectomy    . Cholecystectomy    . Appendectomy    . Tonsillectomy    . Cesarean section    . Shoulder arthroscopy    . Iliac artery  stent      left  . Enteroscopy  01/26/2012    Procedure: ENTEROSCOPY;  Surgeon: Gatha Mayer, MD;  Location: [facility];  Service: Endoscopy;  Laterality: N/A;   Family History  Problem Relation Age of Onset  . Hypertension Mother   . Heart attack Mother   . Hypertension Father   . Angina Father   . Cancer Sister   . Cancer Brother    Social History  Substance Use Topics  . Smoking status: Current Every Day Smoker -- 1.00 packs/day for 36 years    Types: Cigarettes  . Smokeless tobacco: Never Used     Comment: uses e-cig  . Alcohol Use: No   OB History    No data available     Review of Systems  Constitutional: Negative for fever and chills.  Respiratory: Positive for cough and shortness of breath.   Cardiovascular: Positive for leg swelling. Negative for chest pain.  Gastrointestinal: Negative for nausea, vomiting and abdominal pain.  Musculoskeletal: Negative for back pain.  Skin: Negative for rash.  Neurological: Negative for weakness, numbness and headaches.  Psychiatric/Behavioral: Negative for confusion.  All other systems reviewed and are negative.     Allergies  Review of patient's allergies indicates no known allergies.  Home Medications   Prior to Admission medications   Medication Sig Start Date End Date  Taking? Authorizing Provider  acetaminophen (TYLENOL) 500 MG tablet Take 500 mg by mouth every 6 (six) hours as needed. For pain    Historical Provider, MD  albuterol (PROVENTIL HFA;VENTOLIN HFA) 108 (90 BASE) MCG/ACT inhaler Inhale 2 puffs into the lungs every 6 (six) hours as needed. For shortness of breath     Historical Provider, MD  ALPRAZolam (XANAX) 0.25 MG tablet Take 0.25 mg by mouth 2 (two) times daily.     Historical Provider, MD  amLODipine (NORVASC) 10 MG tablet Take 10 mg by mouth daily.     Historical Provider, MD  aspirin EC 81 MG tablet Take 81 mg by mouth daily.    Historical Provider, MD  calcium carbonate (OS-CAL) 600 MG TABS Take  600 mg by mouth 3 (three) times daily with meals.     Historical Provider, MD  donepezil (ARICEPT) 10 MG tablet Take 10 mg by mouth at bedtime.    Historical Provider, MD  Ferrous Sulfate (IRON) 325 (65 FE) MG TABS Take 1 tablet by mouth 3 (three) times daily.     Historical Provider, MD  fish oil-omega-3 fatty acids 1000 MG capsule Take 1 g by mouth 3 (three) times daily. Three times daily    Historical Provider, MD  fluticasone (FLONASE) 50 MCG/ACT nasal spray Place 2 sprays into the nose daily as needed. For allergies     Historical Provider, MD  furosemide (LASIX) 80 MG tablet Take 0.5 tablets (40 mg total) by mouth daily. 11/05/11   Reyne Dumas, MD  losartan (COZAAR) 50 MG tablet Take 50 mg by mouth daily.    Historical Provider, MD  Multiple Vitamin (MULTIVITAMIN WITH MINERALS) TABS Take 1 tablet by mouth daily.    Historical Provider, MD  pantoprazole (PROTONIX) 40 MG tablet Take 1 tablet (40 mg total) by mouth daily. 01/28/12   Ripudeep Krystal Eaton, MD  sertraline (ZOLOFT) 100 MG tablet Take 200 mg by mouth daily.     Historical Provider, MD   Triage Vitals: BP 224/196 mmHg  Pulse 66  Temp(Src) 97.2 F (36.2 C) (Tympanic)  Resp 30  Ht 4\' 11"  (1.499 m)  Wt 145 lb (65.772 kg)  BMI 29.27 kg/m2  SpO2 100%   Physical Exam  Constitutional: She is oriented to person, place, and time. She appears well-developed and well-nourished. She appears distressed.  HENT:  Head: Normocephalic and atraumatic.  Mouth/Throat: Oropharynx is clear and moist.  Eyes: EOM are normal. Pupils are equal, round, and reactive to light.  Neck: Normal range of motion. Neck supple. JVD present.  Cardiovascular: Normal rate and regular rhythm.   Pulmonary/Chest: Effort normal and breath sounds normal. No respiratory distress. She has no wheezes. She has no rales. She exhibits no tenderness.  Crackles in bilateral bases. Patient has decreased air movement with a few scattered expiratory wheezes.  Abdominal: Soft. Bowel  sounds are normal. She exhibits no distension and no mass. There is no tenderness. There is no rebound and no guarding.  Musculoskeletal: Normal range of motion. She exhibits no edema or tenderness.  1+ bilateral pitting edema.  Neurological: She is alert and oriented to person, place, and time.  Moves all extremities without focal deficit. Sensation is intact.  Skin: Skin is warm and dry. No rash noted. No erythema.  Psychiatric: She has a normal mood and affect. Her behavior is normal.  Nursing note and vitals reviewed.   ED Course  Procedures (including critical care time)  DIAGNOSTIC STUDIES: Oxygen Saturation is 96% on NRB,  Adequate by my interpretation.    COORDINATION OF CARE: 3:16 AM- Will order CXR, blood gas, CBC, CMP, EKG,  BNP, and i-stat troponin. Discussed treatment plan with pt at bedside and pt agreed to plan.     Labs Review Labs Reviewed  CBC WITH DIFFERENTIAL/PLATELET - Abnormal; Notable for the following:    WBC 13.7 (*)    RBC 3.72 (*)    Hemoglobin 11.2 (*)    HCT 35.3 (*)    RDW 17.7 (*)    Neutro Abs 11.4 (*)    All other components within normal limits  COMPREHENSIVE METABOLIC PANEL - Abnormal; Notable for the following:    CO2 20 (*)    Glucose, Bld 207 (*)    BUN 41 (*)    Creatinine, Ser 1.65 (*)    Albumin 2.9 (*)    ALT 11 (*)    GFR calc non Af Amer 32 (*)    GFR calc Af Amer 37 (*)    All other components within normal limits  BRAIN NATRIURETIC PEPTIDE - Abnormal; Notable for the following:    B Natriuretic Peptide 2634.2 (*)    All other components within normal limits  I-STAT ARTERIAL BLOOD GAS, ED - Abnormal; Notable for the following:    pH, Arterial 7.261 (*)    pCO2 arterial 46.1 (*)    pO2, Arterial 207.0 (*)    Acid-base deficit 6.0 (*)    All other components within normal limits  BLOOD GAS, ARTERIAL  I-STAT TROPOININ, ED    Imaging Review Dg Chest Port 1 View  11/07/2014   CLINICAL DATA:  64 year old female with shortness  of breath  EXAM: PORTABLE CHEST - 1 VIEW  COMPARISON:  09/07/2012  FINDINGS: There is mild cardiomegaly with diffuse increased interstitial and vascular prominence concerning for congestive changes. There is no focal consolidation, pleural effusion, or pneumothorax. The osseous structures are grossly unremarkable.  IMPRESSION: Cardiomegaly with congestive changes. Superimposed pneumonia is not excluded. Clinical correlation and follow-up recommended.   Electronically Signed   By: Anner Crete M.D.   On: 11/07/2014 03:42   I have personally reviewed and evaluated these images and lab results as part of my medical decision-making.   EKG Interpretation   Date/Time:  Friday November 07 2014 03:52:58 EDT Ventricular Rate:  47 PR Interval:  141 QRS Duration: 100 QT Interval:  506 QTC Calculation: 447 R Axis:   120 Text Interpretation:  Sinus bradycardia Right axis deviation Low voltage,  precordial leads Anteroseptal infarct, old Minimal ST depression, inferior  leads Confirmed by Lita Mains  MD, DAVID ([id number]) on 11/07/2014 5:00:51 AM      MDM   Final diagnoses:  None    I personally performed the services described in this documentation, which was scribed in my presence. The recorded information has been reviewed and is accurate.   Patient initially charted on BiPAP and nitroglycerin drip due to concern for pulmonary edema. Also given IV Lasix in the emergency department. Her blood pressures improved and her shortness of breath has improved as well. We will try patient off BiPAP and nitro drip. Anticipate admission.  Julianne Rice, MD 11/07/14 [phone]

## 2014-11-07 NOTE — Progress Notes (Signed)
I stopped in to deliver a scale per request of Case Manager.  [name] was asleep.  I left written information regarding HF.  If she remains in the hospital I will plan to return and provide education regarding HF and HF recommendations for home.

## 2014-11-07 NOTE — Evaluation (Addendum)
Physical Therapy Evaluation Patient Details Name: [name] MRN: [id number] DOB: Aug 15, 1950 Today's Date: 11/07/2014   History of Present Illness   NEAL OSHEA is a 64 y.o. female with a past medical history significant for chronic diastolic CHF, COPD, history of CVA, ASCVD, chronic iron def anemia, type 2 diabetes, and hypertension who presents with dyspnea and hypoxia. The patient reports 3-4 days of worsening cough, increasing purulent sputum, and increasingly severe dyspnea.  Clinical Impression  Pt admitted with/for SOB due to CAP.  Pt currently limited functionally due to the problems listed below.  (see problems list.)  Pt will benefit from PT to maximize function and safety to be able to get home safely with available assist of family.     Follow Up Recommendations No PT follow up;Supervision for mobility/OOB    Equipment Recommendations  None recommended by PT    Recommendations for Other Services       Precautions / Restrictions Precautions Precautions: Fall Restrictions Weight Bearing Restrictions: No      Mobility  Bed Mobility Overal bed mobility: Needs Assistance Bed Mobility: Supine to Sit;Sit to Supine     Supine to sit: Supervision Sit to supine: Supervision      Transfers Overall transfer level: Needs assistance   Transfers: Sit to/from Stand Sit to Stand: Supervision         General transfer comment: cues for a hand placement/safety  Ambulation/Gait Ambulation/Gait assistance: Supervision Ambulation Distance (Feet): 360 Feet Assistive device: Rolling walker (2 wheeled) Gait Pattern/deviations: Step-through pattern;Drifts right/left     General Gait Details: generally steady with RW, but some wandering with scanning  Stairs            Wheelchair Mobility    Modified Rankin (Stroke Patients Only)       Balance Overall balance assessment: Needs assistance Sitting-balance support: No upper extremity supported Sitting  balance-Leahy Scale: Fair     Standing balance support: No upper extremity supported Standing balance-Leahy Scale: Fair Standing balance comment: but needing RW for dynamic stabiltiy                             Pertinent Vitals/Pain Pain Assessment: No/denies pain    Home Living Family/patient expects to be discharged to:: Private residence Living Arrangements: Spouse/significant other;Children Available Help at Discharge: Family;Available 24 hours/day Type of Home: Mobile home Home Access: Stairs to enter Entrance Stairs-Rails: Right;Left Entrance Stairs-Number of Steps: 2 Home Layout: One level Home Equipment: Walker - 2 wheels;Cane - single point      Prior Function Level of Independence: Independent with assistive device(s)               Hand Dominance        Extremity/Trunk Assessment   Upper Extremity Assessment: Defer to OT evaluation           Lower Extremity Assessment: Overall WFL for tasks assessed (proximal and lower trunk weaknesses)         Communication   Communication: No difficulties  Cognition Arousal/Alertness: Awake/alert Behavior During Therapy: WFL for tasks assessed/performed Overall Cognitive Status: History of cognitive impairments - at baseline                      General Comments General comments (skin integrity, edema, etc.): SpO2 on 4L during gait at 86-89%   HR in the 80's.  With efficient breathing pt brought sats up to 92%  Exercises        Assessment/Plan    PT Assessment Patient needs continued PT services  PT Diagnosis Generalized weakness;Other (comment) (decreased activity tolerance)   PT Problem List Decreased strength;Decreased activity tolerance;Decreased mobility;Cardiopulmonary status limiting activity  PT Treatment Interventions Gait training;Stair training;Functional mobility training;Therapeutic activities;Balance training;Patient/family education   PT Goals (Current goals can  be found in the Care Plan section) Acute Rehab PT Goals Patient Stated Goal: start feeling better PT Goal Formulation: With patient Time For Goal Achievement: 11/14/14 Potential to Achieve Goals: Good    Frequency Min 3X/week   Barriers to discharge        Co-evaluation               End of Session   Activity Tolerance: Patient tolerated treatment well Patient left: in bed;with call bell/phone within reach;with family/visitor present Nurse Communication: Mobility status         Time: 7121-9758 PT Time Calculation (min) (ACUTE ONLY): 20 min   Charges:   PT Evaluation $Initial PT Evaluation Tier I: 1 Procedure     PT G Codes:        Mottinger, Tessie Fass 11/07/2014, 5:37 PM 11/07/2014  Donnella Sham, [place] [phone]  (pager)

## 2014-11-07 NOTE — ED Notes (Signed)
Echo at bedside

## 2014-11-07 NOTE — H&P (Signed)
History and Physical  [name]:[id number] DOB: 09-19-1950 DOA: 11/07/2014  Referring physician: Julianne Rice, MD PCP: [name], MD   Chief Complaint: Dyspnea  HPI: [name] is a 64 y.o. female with a past medical history significant for chronic diastolic CHF, COPD, history of CVA, ASCVD, chronic iron def anemia, type 2 diabetes, and hypertension who presents with dyspnea and hypoxia.  The patient reports 3-4 days of worsening cough, increasing purulent sputum, and increasingly severe dyspnea.  She notes sick contacts (granddaughter with URI).    In the ED, she was noted to be acidotic with leukocytosis, hypoxic requiring oxymask, and had a chest x-ray showing congestion and possible right LL opacity.  She was placed on Bipap with nebulized bronchodilators, a nitroglycerin drip, and given furosemide 80 mg IV and her breathing status improved.  The nitroglycerin drip was discontinued and the patient was stable for admission.   Review of Systems:  Patient seen 0530 on 11/07/2014. Pt complains of productive cough, shortness of breath.  She notes slight increased leg swelling.  She denies wheezing. Otherwise, twelve systems were reviewed and were negative except as noted above in the history of present illness.  Past Medical History  Diagnosis Date  . Peripheral vascular disease     status post left common iliac and external iliac artery stents  . Cerebrovascular disease     Dopplers 3/14:  RICA 60-79% (high end of range -  Stable), LICA 26-33%, mod R subclavian stenosis, mod R prox CCA stenosis;  F/u 6 mos  . Diabetes mellitus   . COPD (chronic obstructive pulmonary disease)   . Hyperlipidemia   . Depression   . Diastolic congestive heart failure   . Hypertension   . Renal artery stenosis     status post  . CAD (coronary artery disease)     50% left main stenosis, ostial 60% LAD stenosis, 30% ramus intermediate stenosis, occluded right artery with  left-to-right collaterals  . Iron deficiency anemia   . Diabetic retinopathy   . 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 Dieaulafoy's lesion 01/25/2012   Past Surgical History  Procedure Laterality Date  . Carotid endarterectomy    . Cholecystectomy    . Appendectomy    . Tonsillectomy    . Cesarean section    . Shoulder arthroscopy    . Iliac artery stent      left  . Enteroscopy  01/26/2012    Procedure: ENTEROSCOPY;  Surgeon: Gatha Mayer, MD;  Location: [facility];  Service: Endoscopy;  Laterality: N/A;   Social History:  reports that she has been smoking Cigarettes.  She has a 36 pack-year smoking history. She has never used smokeless tobacco. She reports that she does not drink alcohol or use illicit drugs. Patient lives with husband and son.  Active smoker.  No alcohol.  Lives in [place].  Has two step-grandson's that she is close to who are 32 years old and were recently involved in a fatal car crash (one died, the other she reports is on life support at [facility]).  No Known Allergies  Family History  Problem Relation Age of Onset  . Hypertension Mother   . Heart attack Mother   . Hypertension Father   . Angina Father   . Cancer Sister   . Cancer Brother     Prior to Admission medications   Medication Sig Start Date  End Date Taking? Authorizing Jeraline Marcinek  acetaminophen (TYLENOL) 500 MG tablet Take 500 mg by mouth every 6 (six) hours as needed. For pain    Historical Tyjay Galindo, MD  albuterol (PROVENTIL HFA;VENTOLIN HFA) 108 (90 BASE) MCG/ACT inhaler Inhale 2 puffs into the lungs every 6 (six) hours as needed. For shortness of breath     Historical Arnika Larzelere, MD  ALPRAZolam (XANAX) 0.25 MG tablet Take 0.25 mg by mouth 2 (two) times daily.     Historical Joelle Flessner, MD  amLODipine (NORVASC) 10 MG tablet Take 10 mg by mouth daily.     Historical Ching Rabideau, MD  aspirin EC 81 MG tablet Take 81  mg by mouth daily.    Historical Leanna Hamid, MD  calcium carbonate (OS-CAL) 600 MG TABS Take 600 mg by mouth 3 (three) times daily with meals.     Historical Julieth Tugman, MD  donepezil (ARICEPT) 10 MG tablet Take 10 mg by mouth at bedtime.    Historical Alieyah Spader, MD  Ferrous Sulfate (IRON) 325 (65 FE) MG TABS Take 1 tablet by mouth 3 (three) times daily.     Historical Brylea Pita, MD  fish oil-omega-3 fatty acids 1000 MG capsule Take 1 g by mouth 3 (three) times daily. Three times daily    Historical Mykira Hofmeister, MD  fluticasone (FLONASE) 50 MCG/ACT nasal spray Place 2 sprays into the nose daily as needed. For allergies     Historical Desyre Calma, MD  furosemide (LASIX) 80 MG tablet Take 0.5 tablets (40 mg total) by mouth daily. 11/05/11   Reyne Dumas, MD  losartan (COZAAR) 50 MG tablet Take 50 mg by mouth daily.    Historical Paddy Walthall, MD  Multiple Vitamin (MULTIVITAMIN WITH MINERALS) TABS Take 1 tablet by mouth daily.    Historical Kandi Brusseau, MD  pantoprazole (PROTONIX) 40 MG tablet Take 1 tablet (40 mg total) by mouth daily. 01/28/12   Ripudeep Krystal Eaton, MD  sertraline (ZOLOFT) 100 MG tablet Take 200 mg by mouth daily.     Historical Castle Lamons, MD    Physical Exam: BP 224/196 mmHg  Pulse 66  Temp(Src) 97.2 F (36.2 C) (Tympanic)  Resp 30  Ht 4\' 11"  (1.499 m)  Wt 65.772 kg (145 lb)  BMI 29.27 kg/m2  SpO2 100% General: Adult female, alert and oriented.  Responds appropriately to questions.  Eye contact appropriate. HEENT: Corneas clear, conjunctivae and sclerae normal without injection or icterus, lids and lashes normal.  PERRL and EOMI. Nose normal. Nearly edentulous.  OP moist without erythema.  There is cobblestoning and purulent discharge in the posterior pharynx.  No airway deformities.  Neck supple.   Cardiac: RRR, nl O8-C1, soft systolic crescendo-decrescendo murmur appreciated at heart base, no gallops.  Capillary refill is less than 2 seconds.  Ankle pitting, 1+ only, bilaterally.  No JVD.     Respiratory: On nasal cannula now.  Normal respiratory rate and rhythm. Speaks in full sentences.  One wheeze heard on right.  Rales on right, base and mid lung.  Coarse breath sounds at bilateral bases. Abdomen: BS present.  No TTP or rebound all quadrants.  No masses or organomegaly.  No ascites, distension. Extremities: No deformities/injuries.  5/5 grip strength and upper extremity flexion/extension, symmetrically.  Extremities are warm and well-perfused. Skin: There are innumerable excoriations and scabs.  There is also occasional petechial appearing rash (on the ankles and wrists.  The patient's bangs have been burned, as if by a cigarette lighter. Neuro: Sensorium intact.  Cranial nerves intact.  Speech is fluent given dental hygiene.  Naming is grossly intact, and the patient's recall, recent and remote, as well as general fund of knowledge seem overall within normal limits.  Muscle strength 5/5 in upper and lower extremities symmetrically. Moves all extremities equally and with normal coordination.   Psych: Appropriate affect.  Normal rate and rhythm of speech.  Thought content appropriate, and thought process linear.  Attention and concentration are normal.           Labs on Admission:  Basic Metabolic Panel:  Recent Labs Lab 11/07/14 0325  NA 138  K 4.7  CL 108  CO2 20*  GLUCOSE 207*  BUN 41*  CREATININE 1.65*  CALCIUM 9.6   Liver Function Tests:  Recent Labs Lab 11/07/14 0325  AST 22  ALT 11*  ALKPHOS 125  BILITOT 0.7  PROT 6.8  ALBUMIN 2.9*    CBC:  Recent Labs Lab 11/07/14 0325  WBC 13.7*  NEUTROABS 11.4*  HGB 11.2*  HCT 35.3*  MCV 94.9  PLT 325    BNP (last 3 results)  Recent Labs  11/07/14 0325  BNP 2634.2*     Radiological Exams on Admission: Dg Chest [facility] Personally reviewed. 11/07/2014   IMPRESSION: Cardiomegaly with congestive changes. Superimposed pneumonia is not excluded.    EKG: Independently reviewed. NSR.  Small  voltages.  No ST changes.  Assessment/Plan Present on Admission:  . Iron deficiency anemia . Peripheral vascular disease . Hypertension . Chronic obstructive pulmonary disease . Coronary artery disease involving native coronary artery . Community acquired pneumonia . Acute on chronic diastolic CHF (congestive heart failure)    1. Community acquired pneumonia: The patient presents with hypoxia, dyspnea, tachypnea, worsening cough, worsening productive sputum, and leukocytosis.  She has no recent hospitalizations or antibiotic use and no documented history of DR organisms.  She has structural lung disease, chronic CHF, and diabetes. - levofloxacin 750 mg PO daily - Incentive spirometry - Sputum culture - Supplemental oxygen for target >= 88% SpO2 - Prednisone 50 mg PO  2. Acute on chronic diastolic CHF: This pneumonia appears to have exacerbated her chronic CHF. - Lasix 80 mg IV once more today - Echocardiogram to evaluate LV function  3. Chronic anemia: Continue home iron  4. Hypertension:  Not-controlled. - Restart home furosemide, losartan, amlodipine.  5. COPD: This is likely a contributing factor although the pCO2 was not so high to account for the degree of acidosis. - nebulized Albuterol PRN     DVT PPx: Lovenox  Diet: Diabetic  Code Status: Full  Family Communication: Husband present at bedside   Disposition Plan:  The appropriate admission status for this patient is INPATIENT. Inpatient status is judged to be reasonable and necessary in order to provide the required intensity of service to ensure the patient's safety. The patient's presenting symptoms, physical exam findings, and initial radiographic and laboratory data in the context of their chronic comorbidities is felt to place them at high risk for further clinical deterioration. Furthermore, it is not anticipated that the patient will be medically stable for discharge from the hospital within 2 midnights  of admission. The following factors support the admission status of inpatient.   A. The patient's presenting symptoms include dyspnea, cough. B. The worrisome physical exam findings include hypoxia, tachypnea, hypertension, tachycardia. C. The initial radiographic and laboratory data are worrisome because of lung parenchymal disease, leukocytosis. D. The chronic co-morbidities include chronic diastolic CHF, chronic COPD, CAD, peripheral vascular disease, history of stroke, chronic iron deficiency anemia. E. Patient  requires inpatient status due to high intensity of service, high risk for further deterioration and high frequency of surveillance required. F. I certify that at the point of admission it is my clinical judgment that the patient will require inpatient hospital care spanning beyond 2 midnights from the point of admission.     [name] [facility] Pager [phone]

## 2014-11-07 NOTE — Care Management Note (Addendum)
Case Management Note  Patient Details  Name: [name] MRN: [id number] Date of Birth: [birth year]  Subjective/Objective:  Pt admitted with HCAP, appears that pneumonia has exacerbated her chronic CHF               Action/Plan:  Pt is independent from home without insurance.  CM will provide HF assessment, assist with appointment at [facility], and will continue to monitor for disposition needs.   Expected Discharge Date:  11/09/14               Expected Discharge Plan:  Home/Self Care  In-House Referral:     Discharge planning Services  CM Consult  Post Acute Care Choice:    Choice offered to:     DME Arranged:    DME Agency:     HH Arranged:    HH Agency:     Status of Service:  In process, will continue to follow  Medicare Important Message Given:    Date Medicare IM Given:    Medicare IM give by:    Date Additional Medicare IM Given:    Additional Medicare Important Message give by:     If discussed at [place] of Stay Meetings, dates discussed:    Additional Comments: CM assessed pt.  Pt verified that she does not have PCP, pt agreed for pt CM to make initial appointment at [facility].  Pt stated she previously had a PCP but that was prior to her losing her insurance.  Appointment scheduled for Monday 19th at 3:30pm at the sickle cell location, as this was the only appointment available.  CM provided brochure with appt specifics, location and items to take to initial appt.  CM instructed pt that if she is still in the hospital on Monday or if she can not make the appointment to call and reschedule.  CM informed pt that she will be able to get medications filled at clinic pharmacy immediately post discharge.  CM assessed pt for HF screening; pt does not have a scale in the home; CM contacted HF nurse in addition to [facility] requesting charity scale  [facility] resource currently working on providing charity scale for pt.  CM was informed by [facility] that a scale could not be provided  with charity. Pt stated she adheres to a low sodium diet.  Pt could benefit from PT/OT evaluation during admission, CM ordered evaluation.  Maryclare Labrador, RN 11/07/2014, 2:26 PM

## 2014-11-07 NOTE — Progress Notes (Signed)
Patient received to unit alert and oriented on 4L [place]. MD notified of arrival. No concerns or complaints at this time.

## 2014-11-07 NOTE — ED Notes (Signed)
Pt in room on 5 L ; lying in bed with no signs of respiratory distress; reports she feels a lot better; breakfast tray ordered.

## 2014-11-07 NOTE — ED Notes (Signed)
Echo still in progress

## 2014-11-07 NOTE — ED Notes (Signed)
Sob for 3 days.  Worse tonight.  Orthopneic tonight.  Had 0.5 atrovent/5albuterol.  Also c/o left shoulder pain enroute.  Denies pain now.  Hx of copd and CHF.

## 2014-11-07 NOTE — ED Notes (Signed)
Echo completed at this time.

## 2014-11-07 NOTE — Progress Notes (Signed)
  Echocardiogram 2D Echocardiogram has been performed.  [name] 11/07/2014, 11:35 AM

## 2014-11-08 LAB — CBC
HEMATOCRIT: 31 % — AB (ref 36.0–46.0)
Hemoglobin: 9.5 g/dL — ABNORMAL LOW (ref 12.0–15.0)
MCH: 29.1 pg (ref 26.0–34.0)
MCHC: 30.6 g/dL (ref 30.0–36.0)
MCV: 94.8 fL (ref 78.0–100.0)
Platelets: 228 10*3/uL (ref 150–400)
RBC: 3.27 MIL/uL — ABNORMAL LOW (ref 3.87–5.11)
RDW: 17.3 % — AB (ref 11.5–15.5)
WBC: 10.2 10*3/uL (ref 4.0–10.5)

## 2014-11-08 LAB — BASIC METABOLIC PANEL
Anion gap: 6 (ref 5–15)
BUN: 50 mg/dL — AB (ref 6–20)
CALCIUM: 9.2 mg/dL (ref 8.9–10.3)
CO2: 22 mmol/L (ref 22–32)
Chloride: 110 mmol/L (ref 101–111)
Creatinine, Ser: 1.88 mg/dL — ABNORMAL HIGH (ref 0.44–1.00)
GFR calc Af Amer: 31 mL/min — ABNORMAL LOW (ref >60)
GFR, EST NON AFRICAN AMERICAN: 27 mL/min — AB (ref >60)
GLUCOSE: 136 mg/dL — AB (ref 65–99)
Potassium: 5.6 mmol/L — ABNORMAL HIGH (ref 3.5–5.1)
Sodium: 138 mmol/L (ref 135–145)

## 2014-11-08 LAB — GLUCOSE, CAPILLARY
GLUCOSE-CAPILLARY: 130 mg/dL — AB (ref 65–99)
GLUCOSE-CAPILLARY: 243 mg/dL — AB (ref 65–99)
GLUCOSE-CAPILLARY: 252 mg/dL — AB (ref 65–99)
Glucose-Capillary: 184 mg/dL — ABNORMAL HIGH (ref 65–99)

## 2014-11-08 LAB — HEMOGLOBIN A1C
Hgb A1c MFr Bld: 6 % — ABNORMAL HIGH (ref 4.8–5.6)
MEAN PLASMA GLUCOSE: 126 mg/dL

## 2014-11-08 MED ORDER — SODIUM POLYSTYRENE SULFONATE 15 GM/60ML PO SUSP
15.0000 g | Freq: Once | ORAL | Status: AC
  Administered 2014-11-08: 15 g via ORAL
  Filled 2014-11-08: qty 60

## 2014-11-08 MED ORDER — FUROSEMIDE 10 MG/ML IJ SOLN
40.0000 mg | Freq: Every day | INTRAMUSCULAR | Status: DC
  Administered 2014-11-08 – 2014-11-09 (×2): 40 mg via INTRAVENOUS
  Filled 2014-11-08 (×2): qty 4

## 2014-11-08 NOTE — Progress Notes (Signed)
[facility] PROGRESS NOTE  [name] ALP:[id number] DOB: 1950/12/17 DOA: 11/07/2014 PCP: [name], MD  Assessment/Plan: Principal Problem:   Community acquired pneumonia - Patient is currently on Levaquin - leukocytosis resolving  Active Problems:   Coronary artery disease involving native coronary artery - stable continue aspirin    History of CVA (cerebrovascular accident) -continue aspirin    Chronic obstructive pulmonary disease - Pt currently on deltasone 50 mg po daily - continue albuterol and duonebs    Hypertension - Pt is currently on amlodipine and cozaar    Peripheral vascular disease - stable    Diabetes mellitus - Pt currently on carb modified diet -  Continue SSI    Iron deficiency anemia - continue ferrous sulfate    Acute on chronic diastolic CHF (congestive heart failure) - Pt placed on lasix 40 mg IV daily - reassess breathing status next am.  Hyperkalemia - lasix on board - Will administer kayexalate  Code Status: full Family Communication: no family at bedside Disposition Plan: pending improvement in respiratory condition   Consultants:  None  Procedures:  None  Antibiotics:  Levaquin  HPI/Subjective: Pt has no new complaints. No acute issues overnight.  Objective: Filed Vitals:   11/08/14 1416  BP: 165/36  Pulse: 60  Temp: 98.3 F (36.8 C)  Resp: 16   No intake or output data in the 24 hours ending 11/08/14 1510 Filed Weights   11/07/14 0310 11/08/14 0637  Weight: 65.772 kg (145 lb) 67.6 kg (149 lb 0.5 oz)    Exam:   General:  Pt in nad, alert and awake  Cardiovascular: rrr, no mrg  Respiratory: rhales at bases, no wheezes, equal chest rise, no accessory muscle use  Abdomen: soft, NT, ND  Musculoskeletal: no cyanosis or clubbing   Data Reviewed: Basic Metabolic Panel:  Recent Labs Lab 11/07/14 0325 11/08/14 0630  NA 138 138  K 4.7 5.6*  CL 108 110  CO2 20* 22  GLUCOSE 207*  136*  BUN 41* 50*  CREATININE 1.65* 1.88*  CALCIUM 9.6 9.2   Liver Function Tests:  Recent Labs Lab 11/07/14 0325  AST 22  ALT 11*  ALKPHOS 125  BILITOT 0.7  PROT 6.8  ALBUMIN 2.9*   No results for input(s): LIPASE, AMYLASE in the last 168 hours. No results for input(s): AMMONIA in the last 168 hours. CBC:  Recent Labs Lab 11/07/14 0325 11/08/14 0630  WBC 13.7* 10.2  NEUTROABS 11.4*  --   HGB 11.2* 9.5*  HCT 35.3* 31.0*  MCV 94.9 94.8  PLT 325 228   Cardiac Enzymes: No results for input(s): CKTOTAL, CKMB, CKMBINDEX, TROPONINI in the last 168 hours. BNP (last 3 results)  Recent Labs  11/07/14 0325  BNP 2634.2*    ProBNP (last 3 results) No results for input(s): PROBNP in the last 8760 hours.  CBG:  Recent Labs Lab 11/07/14 1336 11/07/14 1648 11/07/14 2207 11/08/14 0636 11/08/14 1134  GLUCAP 263* 181* 178* 130* 243*    No results found for this or any previous visit (from the past 240 hour(s)).   Studies: Dg Chest Port 1 View  11/07/2014   CLINICAL DATA:  64 year old female with shortness of breath  EXAM: PORTABLE CHEST - 1 VIEW  COMPARISON:  09/07/2012  FINDINGS: There is mild cardiomegaly with diffuse increased interstitial and vascular prominence concerning for congestive changes. There is no focal consolidation, pleural effusion, or pneumothorax. The osseous structures are grossly unremarkable.  IMPRESSION: Cardiomegaly with congestive changes. Superimposed pneumonia  is not excluded. Clinical correlation and follow-up recommended.   Electronically Signed   By: Anner Crete M.D.   On: 11/07/2014 03:42    Scheduled Meds: . amLODipine  10 mg Oral Daily  . aspirin EC  81 mg Oral Daily  . enoxaparin (LOVENOX) injection  30 mg Subcutaneous Q24H  . ferrous sulfate  325 mg Oral TID WC  . insulin aspart  0-15 Units Subcutaneous TID WC  . [START ON 11/09/2014] levofloxacin  500 mg Oral Q48H  . losartan  50 mg Oral Daily  . pantoprazole  40 mg Oral  Daily  . predniSONE  50 mg Oral Q breakfast  . sertraline  200 mg Oral Daily  . sodium chloride  3 mL Intravenous Q12H   Continuous Infusions:    Time spent: > 35 minutes   Velvet Bathe  [facility] Pager [phone]. If 7PM-7AM, please contact night-coverage at [email or web address], password [facility] 11/08/2014, 3:10 PM  LOS: 1 day

## 2014-11-09 LAB — GLUCOSE, CAPILLARY
GLUCOSE-CAPILLARY: 100 mg/dL — AB (ref 65–99)
GLUCOSE-CAPILLARY: 128 mg/dL — AB (ref 65–99)
Glucose-Capillary: 216 mg/dL — ABNORMAL HIGH (ref 65–99)

## 2014-11-09 LAB — BASIC METABOLIC PANEL
Anion gap: 8 (ref 5–15)
BUN: 50 mg/dL — ABNORMAL HIGH (ref 6–20)
CHLORIDE: 110 mmol/L (ref 101–111)
CO2: 23 mmol/L (ref 22–32)
CREATININE: 1.75 mg/dL — AB (ref 0.44–1.00)
Calcium: 9.3 mg/dL (ref 8.9–10.3)
GFR calc non Af Amer: 30 mL/min — ABNORMAL LOW (ref >60)
GFR, EST AFRICAN AMERICAN: 34 mL/min — AB (ref >60)
GLUCOSE: 130 mg/dL — AB (ref 65–99)
Potassium: 5.1 mmol/L (ref 3.5–5.1)
Sodium: 141 mmol/L (ref 135–145)

## 2014-11-09 MED ORDER — LOSARTAN POTASSIUM 50 MG PO TABS: 50.0000 mg | ORAL_TABLET | Freq: Every day | ORAL | Status: DC

## 2014-11-09 MED ORDER — PREDNISONE 50 MG PO TABS: 50.0000 mg | ORAL_TABLET | Freq: Every day | ORAL | Status: DC

## 2014-11-09 MED ORDER — LEVOFLOXACIN 500 MG PO TABS: 500.0000 mg | ORAL_TABLET | ORAL | Status: DC

## 2014-11-09 NOTE — Progress Notes (Signed)
Pt discharge education and instructions completed with pt; pt denies any questions. Pt IV and telemetry removed; pt discharge home with spouse to transport her home. Pt to pick up electronically sent prescriptions from preferred pharmacy on file; pt home oxygen delivered to pt at bedside; pt transported off unit via wheelchair with belongings and spouse at side. Francis Gaines Lequan Dobratz RN.

## 2014-11-09 NOTE — Care Management Note (Signed)
Case Management Note  Patient Details  Name: [name] MRN: [id number] Date of Birth: 13-Jun-1950  Subjective/Objective:                   Community acquired pneumonia Action/Plan:  Discharge planning Expected Discharge Date:  11/09/14               Expected Discharge Plan:  Home/Self Care  In-House Referral:     Discharge planning Services  CM Consult  Post Acute Care Choice:    Choice offered to:     DME Arranged:  Oxygen DME Agency:  [place]:    [facility] Agency:     Status of Service:  Completed, signed off  Medicare Important Message Given:    Date Medicare IM Given:    Medicare IM give by:    Date Additional Medicare IM Given:    Additional Medicare Important Message give by:     If discussed at [place] of Stay Meetings, dates discussed:    Additional Comments: CM received call from RN for home oxygen. CM called [facility] rep, Tiffany for charity home O2.  CM called [facility] DME rep, Merry Proud to please deliver the O2 to room so pt can discharge.  CM met with pt and gave pt [place] letter with list of participating pharmacies and a [facility] pamphlet with a reminder of her appointment tomorrow.  Pt verbalized understanding of MATCH parameters, [place] appt and is waiting on daughter for transportation home and oxygen to be delivered to room.  Cm made RN aware to wait for oxygen to be delivered to room prior to discharge.  No other Cm needs were communicated. Dellie Catholic, RN 11/09/2014, 4:59 PM

## 2014-11-09 NOTE — Discharge Summary (Signed)
Physician Discharge Summary  [name] HAL:[id number] DOB: 1950-08-07 DOA: 11/07/2014  PCP: [name], MD  Admit date: 11/07/2014 Discharge date: 11/09/2014  Time spent: > 35 minutes  Recommendations for Outpatient Follow-up:  1.  Please monitor blood pressures and adjust medications accordingly 2. Will be discharge on levaquin  Discharge Diagnoses:  Principal Problem:   Community acquired pneumonia Active Problems:   Coronary artery disease involving native coronary artery   History of CVA (cerebrovascular accident)   Chronic obstructive pulmonary disease   Hypertension   Peripheral vascular disease   Diabetes mellitus   Iron deficiency anemia   Acute on chronic diastolic CHF (congestive heart failure)   Discharge Condition: stable on supplemental oxygen  Diet recommendation: Carb modified diet  Filed Weights   11/07/14 0310 11/08/14 0637 11/09/14 0439  Weight: 65.772 kg (145 lb) 67.6 kg (149 lb 0.5 oz) 67.178 kg (148 lb 1.6 oz)    History of present illness:  64 y.o. female with a past medical history significant for chronic diastolic CHF, COPD, history of CVA, ASCVD, chronic iron def anemia, type 2 diabetes, and hypertension who presents with dyspnea and hypoxia.  Hospital Course:  Community acquired pneumonia - Patient is currently on Levaquin will discharge on this regimen - leukocytosis resolving  COPD - will d/c on short course of prednisone, Will recommend carb modified diet on discharge - Recommend pulmonary function testing once breathing back to baseline  Active Problems:  Coronary artery disease involving native coronary artery - stable continue aspirin   History of CVA (cerebrovascular accident) -continue aspirin   Chronic obstructive pulmonary disease - Pt currently on deltasone 50 mg po daily will continue for 2 more days of complete a five-day treatment regimen - continue albuterol and duonebs   Hypertension - Pt is currently  on amlodipine and cozaar will discharge on metoprolol as well   Peripheral vascular disease - stable   Diabetes mellitus - On discharge will recommend carb modified diet   Iron deficiency anemia - continue ferrous sulfate   Acute on chronic diastolic CHF (congestive heart failure) - Continue 40 mg of Lasix daily  Hyperkalemia - Resolved   Procedures:  None  Consultations:  None  Discharge Exam: Filed Vitals:   11/09/14 1417  BP: 169/41  Pulse: 68  Temp: 97.8 F (36.6 C)  Resp: 18    General: Patient in no acute distress, alert and awake Cardiovascular: Regular rate and rhythm, no murmurs rubs Respiratory: No increased work of breathing, nasal cannula in place, equal chest rise, no wheezes  Discharge Instructions   Discharge Instructions    Call MD for:  difficulty breathing, headache or visual disturbances    Complete by:  As directed      Call MD for:  severe uncontrolled pain    Complete by:  As directed      Call MD for:  temperature >100.4    Complete by:  As directed      Diet - low sodium heart healthy    Complete by:  As directed      Discharge instructions    Complete by:  As directed   Please f/u with your primary care physician in 1-2 weeks or sooner should any new concerns arise.     Increase activity slowly    Complete by:  As directed           Current Discharge Medication List    START taking these medications   Details  levofloxacin (LEVAQUIN) 500  MG tablet Take 1 tablet (500 mg total) by mouth every other day. Qty: 2 tablet, Refills: 0    predniSONE (DELTASONE) 50 MG tablet Take 1 tablet (50 mg total) by mouth daily with breakfast. Qty: 2 tablet, Refills: 0      CONTINUE these medications which have CHANGED   Details  losartan (COZAAR) 50 MG tablet Take 1 tablet (50 mg total) by mouth daily.      CONTINUE these medications which have NOT CHANGED   Details  acetaminophen (TYLENOL) 500 MG tablet Take 500 mg by mouth every  6 (six) hours as needed (pain).     amLODipine (NORVASC) 10 MG tablet Take 10 mg by mouth daily.     aspirin EC 81 MG tablet Take 81 mg by mouth daily.    Calcium Carbonate Antacid (TUMS PO) Take 1 tablet by mouth daily as needed (acid reflux/heartburn).    Ferrous Sulfate (IRON) 325 (65 FE) MG TABS Take 325 mg by mouth 3 (three) times daily.     fish oil-omega-3 fatty acids 1000 MG capsule Take 1 g by mouth 3 (three) times daily. Three times daily    furosemide (LASIX) 80 MG tablet Take 0.5 tablets (40 mg total) by mouth daily. Qty: 30 tablet, Refills: 0    metoprolol (LOPRESSOR) 50 MG tablet Take 50 mg by mouth 2 (two) times daily. Refills: 6    sertraline (ZOLOFT) 100 MG tablet Take 200 mg by mouth daily.     pantoprazole (PROTONIX) 40 MG tablet Take 1 tablet (40 mg total) by mouth daily. Qty: 30 tablet, Refills: 3      STOP taking these medications     cloNIDine (CATAPRES) 0.1 MG tablet        No Known Allergies Follow-up Information    Follow up with [facility] [place] [facility] On 11/10/2014.   Why:  at 3:30, please take with you: picture ID, discharge list of medicaitons, $20 copay   Contact information:   [place] [address] [phone]       The results of significant diagnostics from this hospitalization (including imaging, microbiology, ancillary and laboratory) are listed below for reference.    Significant Diagnostic Studies: Dg Chest Port 1 View  11/07/2014   CLINICAL DATA:  64 year old female with shortness of breath  EXAM: PORTABLE CHEST - 1 VIEW  COMPARISON:  09/07/2012  FINDINGS: There is mild cardiomegaly with diffuse increased interstitial and vascular prominence concerning for congestive changes. There is no focal consolidation, pleural effusion, or pneumothorax. The osseous structures are grossly unremarkable.  IMPRESSION: Cardiomegaly with congestive changes. Superimposed pneumonia is not  excluded. Clinical correlation and follow-up recommended.   Electronically Signed   By: Anner Crete M.D.   On: 11/07/2014 03:42    Microbiology: No results found for this or any previous visit (from the past 240 hour(s)).   Labs: Basic Metabolic Panel:  Recent Labs Lab 11/07/14 0325 11/08/14 0630 11/09/14 0422  NA 138 138 141  K 4.7 5.6* 5.1  CL 108 110 110  CO2 20* 22 23  GLUCOSE 207* 136* 130*  BUN 41* 50* 50*  CREATININE 1.65* 1.88* 1.75*  CALCIUM 9.6 9.2 9.3   Liver Function Tests:  Recent Labs Lab 11/07/14 0325  AST 22  ALT 11*  ALKPHOS 125  BILITOT 0.7  PROT 6.8  ALBUMIN 2.9*   No results for input(s): LIPASE, AMYLASE in the last 168 hours. No results for input(s):  AMMONIA in the last 168 hours. CBC:  Recent Labs Lab 11/07/14 0325 11/08/14 0630  WBC 13.7* 10.2  NEUTROABS 11.4*  --   HGB 11.2* 9.5*  HCT 35.3* 31.0*  MCV 94.9 94.8  PLT 325 228   Cardiac Enzymes: No results for input(s): CKTOTAL, CKMB, CKMBINDEX, TROPONINI in the last 168 hours. BNP: BNP (last 3 results)  Recent Labs  11/07/14 0325  BNP 2634.2*    ProBNP (last 3 results) No results for input(s): PROBNP in the last 8760 hours.  CBG:  Recent Labs Lab 11/08/14 1134 11/08/14 1632 11/08/14 2116 11/09/14 0631 11/09/14 1101  GLUCAP 243* 252* 184* 100* 128*       Signed:  Velvet Bathe  [facility] 11/09/2014, 3:34 PM

## 2014-11-09 NOTE — Progress Notes (Signed)
SATURATION QUALIFICATIONS: (This note is used to comply with regulatory documentation for home oxygen)  Patient Saturations on Room Air at Rest = 92%  Patient Saturations on Room Air while Ambulating = 84%  Patient Saturations on 2 Liters of oxygen while Ambulating = 94%  Please briefly explain why patient needs home oxygen: pt was 90-92% RA; dropped to 84% on RA during ambulation; pt placed on 2L and she went up to 94%; pt saturation drops to high 80's intermediately during ambulation but comes up to the lower 90's after taking resting periods in between walk. Delia Heady RN

## 2014-11-10 ENCOUNTER — Encounter: Payer: Self-pay | Admitting: Family Medicine

## 2014-11-10 ENCOUNTER — Ambulatory Visit (INDEPENDENT_AMBULATORY_CARE_PROVIDER_SITE_OTHER): Payer: Self-pay | Admitting: Family Medicine

## 2014-11-10 VITALS — BP 179/54 | HR 60 | Temp 97.9°F | Resp 20 | Ht 59.0 in | Wt 147.0 lb

## 2014-11-10 DIAGNOSIS — Z8673 Personal history of transient ischemic attack (TIA), and cerebral infarction without residual deficits: Secondary | ICD-10-CM

## 2014-11-10 DIAGNOSIS — E119 Type 2 diabetes mellitus without complications: Secondary | ICD-10-CM

## 2014-11-10 DIAGNOSIS — F17200 Nicotine dependence, unspecified, uncomplicated: Secondary | ICD-10-CM

## 2014-11-10 DIAGNOSIS — D638 Anemia in other chronic diseases classified elsewhere: Secondary | ICD-10-CM

## 2014-11-10 DIAGNOSIS — J449 Chronic obstructive pulmonary disease, unspecified: Secondary | ICD-10-CM

## 2014-11-10 DIAGNOSIS — I10 Essential (primary) hypertension: Secondary | ICD-10-CM

## 2014-11-10 DIAGNOSIS — Z23 Encounter for immunization: Secondary | ICD-10-CM

## 2014-11-10 DIAGNOSIS — Z9981 Dependence on supplemental oxygen: Secondary | ICD-10-CM

## 2014-11-10 DIAGNOSIS — Z8639 Personal history of other endocrine, nutritional and metabolic disease: Secondary | ICD-10-CM

## 2014-11-10 LAB — POCT URINALYSIS DIP (DEVICE)
BILIRUBIN URINE: NEGATIVE
Glucose, UA: NEGATIVE mg/dL
KETONES UR: NEGATIVE mg/dL
LEUKOCYTES UA: NEGATIVE
Nitrite: NEGATIVE
PH: 5 (ref 5.0–8.0)
Protein, ur: >=300 mg/dL — AB
Specific Gravity, Urine: 1.02 (ref 1.005–1.030)
Urobilinogen, UA: 0.2 mg/dL (ref 0.0–1.0)

## 2014-11-10 LAB — HEMOGLOBIN A1C
HEMOGLOBIN A1C: 6 % — AB (ref 4.8–5.6)
MEAN PLASMA GLUCOSE: 126 mg/dL

## 2014-11-10 NOTE — Progress Notes (Signed)
Subjective:    Patient ID: [name] [name], female    DOB: Jan 23, 1951, 64 y.o.   MRN: [id number]  HPI [name] [name], a 64 year old female with a history of hypertension, type II diabetes, COPD, CVD, and anemia presents to establish care. Patient was recently admitted to the hospital from 9-16-9/18 for community acquired pneumonia. Patient was prescribed antibiotic therapy. She was discharged from the hospital on 11/09/2014 and she will start antibiotic and steroid today. She states that shortness of breath has improved since discharge.  Patient has a history of COPD and is on 2 liters of home oxygen. She continues to smoke 2-3 cigarettes per day, which is decreased from 0.5 packs. [name] [name] also has a history of hypertension. She states that she has been taking anti-hypertensive medications consistently. She does not follow a routine diet or exercise regimen. She states that she was previously followed by DR. Hochrein at [facility]. She has not followed up due to insurance and financial constraints. She has a history of diastolic congestive heart failure and coronary disease. She does not complain of any new cardic complaints.  She currently denies headache, dizziness, fatigue, chest pains, shortness of breath, nausea, vomiting, diarrhea, or constipation Past Medical History  Diagnosis Date  . Peripheral vascular disease     status post left common iliac and external iliac artery stents  . Cerebrovascular disease     Dopplers 3/14:  RICA 60-79% (high end of range -  Stable), LICA 26-71%, mod R subclavian stenosis, mod R prox CCA stenosis;  F/u 6 mos  . Diabetes mellitus   . COPD (chronic obstructive pulmonary disease)   . Hyperlipidemia   . Depression   . Diastolic congestive heart failure   . Hypertension   . Renal artery stenosis     status post  . CAD (coronary artery disease)     50% left main stenosis, ostial 60% LAD stenosis, 30% ramus intermediate stenosis, occluded right artery  with left-to-right collaterals  . Iron deficiency anemia   . Diabetic retinopathy   . 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 Dieaulafoy's lesion 01/25/2012     Medication List       This list is accurate as of: 11/10/14  3:38 PM.  Always use your most recent med list.               acetaminophen 500 MG tablet  Commonly known as:  TYLENOL  Take 500 mg by mouth every 6 (six) hours as needed (pain).     amLODipine 10 MG tablet  Commonly known as:  NORVASC  Take 10 mg by mouth daily.     aspirin EC 81 MG tablet  Take 81 mg by mouth daily.     fish oil-omega-3 fatty acids 1000 MG capsule  Take 1 g by mouth 3 (three) times daily. Three times daily     furosemide 80 MG tablet  Commonly known as:  LASIX  Take 0.5 tablets (40 mg total) by mouth daily.     Iron 325 (65 FE) MG Tabs  Take 325 mg by mouth 3 (three) times daily.     levofloxacin 500 MG tablet  Commonly known as:  LEVAQUIN  Take 1 tablet (500 mg total) by mouth every other day.     losartan 50 MG tablet  Commonly known as:  COZAAR  Take 1 tablet (50 mg total) by  mouth daily.     metoprolol 50 MG tablet  Commonly known as:  LOPRESSOR  Take 50 mg by mouth 2 (two) times daily.     pantoprazole 40 MG tablet  Commonly known as:  PROTONIX  Take 1 tablet (40 mg total) by mouth daily.     predniSONE 50 MG tablet  Commonly known as:  DELTASONE  Take 1 tablet (50 mg total) by mouth daily with breakfast.     TUMS PO  Take 1 tablet by mouth daily as needed (acid reflux/heartburn).     ZOLOFT 100 MG tablet  Generic drug:  sertraline  Take 200 mg by mouth daily.       Social History   Social History  . Marital Status: Married    Spouse Name: N/A  . Number of Children: N/A  . Years of Education: N/A   Occupational History  . Not on file.   Social History Main Topics  . Smoking status: Current Every Day Smoker -- 1.00  packs/day for 36 years    Types: Cigarettes  . Smokeless tobacco: Never Used     Comment: uses e-cig  . Alcohol Use: No  . Drug Use: No  . Sexual Activity: No   Other Topics Concern  . Not on file   Social History Narrative  No Known Allergies  Immunization History  Administered Date(s) Administered  . Influenza Split 11/04/2011   Review of Systems  Constitutional: Negative.  Negative for fever, fatigue and unexpected weight change.  HENT: Negative.   Eyes: Negative for visual disturbance.  Respiratory: Negative.  Negative for shortness of breath (currently on home oxygen).   Cardiovascular: Negative.   Gastrointestinal: Negative.   Endocrine: Negative for polydipsia, polyphagia and polyuria.  Musculoskeletal: Negative.   Skin: Negative.   Allergic/Immunologic: Negative.   Neurological: Negative.  Negative for dizziness, weakness, light-headedness, numbness and headaches.  Hematological: Negative.   Psychiatric/Behavioral: Negative.         Objective:   Physical Exam  Constitutional: She is oriented to person, place, and time. She appears well-developed and well-nourished.  HENT:  Head: Normocephalic and atraumatic.  Right Ear: External ear normal.  Left Ear: External ear normal.  Mouth/Throat: Uvula is midline. Abnormal dentition (partially edentulous).  Eyes: Conjunctivae and EOM are normal. Pupils are equal, round, and reactive to light.  Neck: Normal range of motion. Neck supple.  Cardiovascular: Normal rate, regular rhythm, normal heart sounds and intact distal pulses.   Pulmonary/Chest: Effort normal and breath sounds normal.  Abdominal: Soft. Bowel sounds are normal.  Musculoskeletal: Normal range of motion.  Neurological: She is alert and oriented to person, place, and time. She has normal reflexes.  Skin: Skin is warm.  Psychiatric: She has a normal mood and affect. Her behavior is normal. Judgment and thought content normal.         BP 179/54 mmHg   Pulse 60  Temp(Src) 97.9 F (36.6 C) (Oral)  Resp 20  Ht 4\' 11"  (1.499 [name])  Wt 147 lb (66.679 kg)  BMI 29.67 kg/m2  SpO2 91% Assessment & Plan:    1. Essential hypertension Blood pressure is above goal on current medication regimen. Will check urinalysis for proteinuria. Patient is currently under the care of Dr. Erling Cruz,  nephrologist. Will request office records. Will consult with Dr. Florene Glen prior to adjusting blood pressure medications. The patient is asked to make an attempt to improve diet  to aid in medical management of this problem.  Patient was  under the care of cardiologist, she was unable to follow up due to insurance constraints. Medications have been continued by Dr. Florene Glen.   - Microalbumin/Creatinine Ratio, Urine  2. Type 2 diabetes mellitus without complication Reviewed previous labs, hemoglobin a1C 6.0. Will not start anti-diabetic medications at this point. Continue lowfat, low carbohydrate diet as discussed. - POCT urinalysis dipstick  3. Need for immunization against influenza  - Flu Vaccine QUAD 36+ mos IM (Fluarix)  4. Chronic obstructive pulmonary disease, unspecified COPD, unspecified chronic bronchitis type COPD controlled on home oxygen therapy. She currently does not have spirometry. Recommend spirometry.    5. On home oxygen therapy Patient currently on 2 L oxygen. Patient states that oxygen therapy is monitored by [place]. Recommend a 6 minute walk test.  6. Tobacco dependence Patient has decreased smoking to 3-4 cigarettes per day. Smoking cessation instruction/counseling given:  counseled patient on the dangers of tobacco use, advised patient to stop smoking, and reviewed strategies to maximize success  7. Need for Tdap vaccination - Tdap vaccine greater than or equal to 7yo IM  8. History of hyperlipidemia Reviewed previous lipid panel, will order fasting lipid panel prior to next follow-up appointment - Lipid Panel; Future  9.  Anemia, chronic disease Reviewed hemoglobin from 11/08/2014, hemoglobin 9.5, will continue Ferrous sulfate   [name] [name],[name] M, FNP   The patient was given clear instructions to go to ER or return to medical center if symptoms do not improve, worsen or new problems develop. The patient verbalized understanding. Will notify patient with laboratory results.

## 2014-11-11 LAB — MICROALBUMIN / CREATININE URINE RATIO
CREATININE, URINE: 42.3 mg/dL
MICROALB UR: 158.5 mg/dL — AB (ref <2.0)
Microalb Creat Ratio: 3747 mg/g — ABNORMAL HIGH (ref 0.0–30.0)

## 2014-12-11 ENCOUNTER — Ambulatory Visit (INDEPENDENT_AMBULATORY_CARE_PROVIDER_SITE_OTHER): Payer: No Typology Code available for payment source | Admitting: Family Medicine

## 2014-12-11 VITALS — BP 158/78 | HR 52 | Temp 98.1°F | Resp 16 | Ht <= 58 in | Wt 142.0 lb

## 2014-12-11 DIAGNOSIS — B86 Scabies: Secondary | ICD-10-CM

## 2014-12-11 DIAGNOSIS — I10 Essential (primary) hypertension: Secondary | ICD-10-CM

## 2014-12-11 DIAGNOSIS — L282 Other prurigo: Secondary | ICD-10-CM

## 2014-12-11 DIAGNOSIS — Z9981 Dependence on supplemental oxygen: Secondary | ICD-10-CM

## 2014-12-11 DIAGNOSIS — E119 Type 2 diabetes mellitus without complications: Secondary | ICD-10-CM

## 2014-12-11 DIAGNOSIS — J449 Chronic obstructive pulmonary disease, unspecified: Secondary | ICD-10-CM

## 2014-12-11 DIAGNOSIS — F17200 Nicotine dependence, unspecified, uncomplicated: Secondary | ICD-10-CM

## 2014-12-11 LAB — BASIC METABOLIC PANEL
BUN: 29 mg/dL — ABNORMAL HIGH (ref 7–25)
CALCIUM: 8.6 mg/dL (ref 8.6–10.4)
CO2: 22 mmol/L (ref 20–31)
Chloride: 107 mmol/L (ref 98–110)
Creat: 1.52 mg/dL — ABNORMAL HIGH (ref 0.50–0.99)
Glucose, Bld: 87 mg/dL (ref 65–99)
POTASSIUM: 5.2 mmol/L (ref 3.5–5.3)
SODIUM: 140 mmol/L (ref 135–146)

## 2014-12-11 LAB — POCT URINALYSIS DIP (DEVICE)
Bilirubin Urine: NEGATIVE
GLUCOSE, UA: NEGATIVE mg/dL
KETONES UR: NEGATIVE mg/dL
LEUKOCYTES UA: NEGATIVE
Nitrite: NEGATIVE
SPECIFIC GRAVITY, URINE: 1.025 (ref 1.005–1.030)
UROBILINOGEN UA: 0.2 mg/dL (ref 0.0–1.0)
pH: 6.5 (ref 5.0–8.0)

## 2014-12-11 MED ORDER — PERMETHRIN 5 % EX CREA: 1.0000 "application " | TOPICAL_CREAM | Freq: Once | CUTANEOUS | Status: DC

## 2014-12-11 MED ORDER — HYDROCORTISONE 2.5 % EX LOTN: TOPICAL_LOTION | Freq: Two times a day (BID) | CUTANEOUS | Status: DC

## 2014-12-11 MED ORDER — HYDROCORTISONE 1 % EX LOTN: 1.0000 "application " | TOPICAL_LOTION | Freq: Two times a day (BID) | CUTANEOUS | Status: DC

## 2014-12-11 NOTE — Progress Notes (Signed)
Subjective:    Patient ID: [name], female    DOB: Oct 05, 1950, 64 y.o.   MRN: [id number]  HPI [name], a 64 year old female with a history of hypertension, type II diabetes, COPD, CVD, and anemia presents for a 1 month follow up of hypertension. She states that she has been taking anti-hypertensive medications consistently. She does not follow a routine diet or exercise regimen. She states that she was previously followed by DR. Hochrein at [facility]. She has not followed up due to insurance and financial constraints. She has a history of diastolic congestive heart failure and coronary disease. She does not complain of any new cardic complaints.  She currently denies headache, dizziness, fatigue, chest pains, shortness of breath, nausea, vomiting, diarrhea, or constipation.  Past Medical History  Diagnosis Date  . Peripheral vascular disease     status post left common iliac and external iliac artery stents  . Cerebrovascular disease     Dopplers 3/14:  RICA 60-79% (high end of range -  Stable), LICA 50-27%, mod R subclavian stenosis, mod R prox CCA stenosis;  F/u 6 mos  . Diabetes mellitus   . COPD (chronic obstructive pulmonary disease)   . Hyperlipidemia   . Depression   . Diastolic congestive heart failure   . Hypertension   . Renal artery stenosis     status post  . CAD (coronary artery disease)     50% left main stenosis, ostial 60% LAD stenosis, 30% ramus intermediate stenosis, occluded right artery with left-to-right collaterals  . Iron deficiency anemia   . Diabetic retinopathy   . 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 Dieaulafoy's lesion 01/25/2012     Medication List       This list is accurate as of: 12/11/14 11:10 AM.  Always use your most recent med list.               acetaminophen 500 MG tablet  Commonly known as:  TYLENOL  Take 500 mg by mouth every 6 (six)  hours as needed (pain).     amLODipine 10 MG tablet  Commonly known as:  NORVASC  Take 10 mg by mouth daily.     aspirin EC 81 MG tablet  Take 81 mg by mouth daily.     fish oil-omega-3 fatty acids 1000 MG capsule  Take 1 g by mouth 3 (three) times daily. Three times daily     furosemide 80 MG tablet  Commonly known as:  LASIX  Take 0.5 tablets (40 mg total) by mouth daily.     Iron 325 (65 FE) MG Tabs  Take 325 mg by mouth 3 (three) times daily.     levofloxacin 500 MG tablet  Commonly known as:  LEVAQUIN  Take 1 tablet (500 mg total) by mouth every other day.     losartan 50 MG tablet  Commonly known as:  COZAAR  Take 1 tablet (50 mg total) by mouth daily.     metoprolol 50 MG tablet  Commonly known as:  LOPRESSOR  Take 50 mg by mouth 2 (two) times daily.     pantoprazole 40 MG tablet  Commonly known as:  PROTONIX  Take 1 tablet (40 mg total) by mouth daily.     predniSONE 50 MG tablet  Commonly known as:  DELTASONE  Take 1 tablet (50 mg total) by mouth daily with breakfast.  TUMS PO  Take 1 tablet by mouth daily as needed (acid reflux/heartburn).     ZOLOFT 100 MG tablet  Generic drug:  sertraline  Take 200 mg by mouth daily.       Social History   Social History  . Marital Status: Married    Spouse Name: N/A  . Number of Children: N/A  . Years of Education: N/A   Occupational History  . Not on file.   Social History Main Topics  . Smoking status: Current Every Day Smoker -- 1.00 packs/day for 36 years    Types: Cigarettes  . Smokeless tobacco: Never Used     Comment: uses e-cig  . Alcohol Use: No  . Drug Use: No  . Sexual Activity: No   Other Topics Concern  . Not on file   Social History Narrative  No Known Allergies  Immunization History  Administered Date(s) Administered  . Influenza Split 11/04/2011  . Influenza,inj,Quad PF,36+ Mos 11/10/2014  . Tdap 11/10/2014   Review of Systems  Constitutional: Negative.  Negative for  fever, fatigue and unexpected weight change.  HENT: Negative.   Eyes: Negative.  Negative for visual disturbance.  Respiratory: Negative.  Negative for cough, chest tightness, shortness of breath (currently on home oxygen) and wheezing.   Cardiovascular: Negative.  Negative for chest pain, palpitations and leg swelling.  Gastrointestinal: Negative.   Endocrine: Negative.  Negative for polydipsia, polyphagia and polyuria.  Musculoskeletal: Negative.   Skin: Positive for rash.       Patient states that she has had scabies and bedbugs in the past. Generalized itching.   Allergic/Immunologic: Negative.   Neurological: Negative.  Negative for dizziness, weakness, light-headedness, numbness and headaches.  Hematological: Negative.   Psychiatric/Behavioral: Negative.         Objective:   Physical Exam  Constitutional: She is oriented to person, place, and time. She appears well-developed and well-nourished.  HENT:  Head: Normocephalic and atraumatic.  Right Ear: External ear normal.  Left Ear: External ear normal.  Mouth/Throat: Uvula is midline. Abnormal dentition (partially edentulous).  Eyes: Conjunctivae and EOM are normal. Pupils are equal, round, and reactive to light.  Neck: Normal range of motion. Neck supple.  Cardiovascular: Normal rate, regular rhythm, normal heart sounds and intact distal pulses.   Pulmonary/Chest: Effort normal and breath sounds normal.  Abdominal: Soft. Bowel sounds are normal.  Musculoskeletal: Normal range of motion.  Neurological: She is alert and oriented to person, place, and time. She has normal reflexes.  Skin: Skin is warm, dry and intact. Rash noted. Rash is maculopapular (round, flat, erythematous, pruritic rash). No cyanosis. Nails show no clubbing.  Generalized rash, also located in webs of fingers.   Psychiatric: She has a normal mood and affect. Her behavior is normal. Judgment and thought content normal.      BP 158/78 mmHg  Pulse 52   Temp(Src) 98.1 F (36.7 C) (Oral)  Resp 16  Ht 4\' 10"  (1.473 m)  Wt 142 lb (64.411 kg)  BMI 29.69 kg/m2 Assessment & Plan:   1. Essential hypertension Blood pressure is above goal on current medication regimen. I will not increase blood pressure medications at this point. Patient has microalbuminemia and is under the care of Dr. Erling Cruz,  Nephrologist for chronic kidney failure and renal artery stenosis. Reviewed office note. Will continue medications as previously prescribed.  Will consult with Dr. Florene Glen prior to adjusting blood pressure medications. According to [facility], [name] has a  follow up appointment scheduled for January 07, 2015.  The patient is asked to make an attempt to improve diet  to aid in medical management of this problem.  Patient was under the care of cardiologist, she was unable to follow up due to insurance constraints. Will fax labs to [facility]   - Basic Metabolic Panel - POCT urinalysis dipstick  2.Type 2 diabetes mellitus without complication, without long-term current use of insulin ([place]) Prediabetes Discussed previous lab results. Will not start medications at this time.   2. Crusted scabies Patient has a wide spread rash described as pruritis in webfolds of fingers and on lower extremities primarily. Discussed medication application at length. Patient and her daughter expressed understanding.  - permethrin (ELIMITE) 5 % cream; Apply 1 application topically once.  Dispense: 60 g; Refill: 0  3. Pruritic rash  - hydrocortisone 2.5 % lotion; Apply topically 2 (two) times daily.  Dispense: 59 mL; Refill: 2   4. Chronic obstructive pulmonary disease, unspecified COPD type ([place]) Controlled since previous hospital visit. She continues to smoke 3-4 cigarettes per day. Patient encouraged to quit smoking. Patient is on home oxygen at 2 liters.   5. TOBACCO ABUSE  Smoking cessation instruction/counseling given:   counseled patient on the dangers of tobacco use, advised patient to stop smoking, and reviewed strategies to maximize success   6. On home oxygen therapy Patient is currently on 2 liters of oxygen at home. To continue as previously ordered.   RTC: 3 months for hypertension  The patient was given clear instructions to go to ER or return to medical center if symptoms do not improve, worsen or new problems develop. The patient verbalized understanding. Will notify patient with laboratory results.

## 2014-12-11 NOTE — Patient Instructions (Addendum)
Shampoo hair, rinse, and towel dry before use. Use a shampoo without conditioner Shake permethrin well before use Cover eyes and face with a towel Lightly coat hair and scalp Work into hair and scalp gently Leave on for 10 minutes Do not cover head while cream rinse or lotion is on hair After 10 minutes, put a little bit of warm water on hair and lather Rinse fully Wash your hands after use Dry with a towel  After 8 to 14 hours, fully was cream from skin using warm, soapy waterScabies, Adult Scabies is a skin condition that happens when very small insects get under the skin (infestation). This causes a rash and severe itchiness. Scabies can spread from person to person (is contagious). If you get scabies, it is common for others in your household to get scabies too. With proper treatment, symptoms usually go away in 2-4 weeks. Scabies usually does not cause lasting problems. CAUSES This condition is caused by mites (Sarcoptes scabiei, or human itch mites) that can only be seen with a microscope. The mites get into the top layer of skin and lay eggs. Scabies can spread from person to person through:  Close contact with a person who has scabies.  Contact with infested items, such as towels, bedding, or clothing. RISK FACTORS This condition is more likely to develop in:  People who live in nursing homes and other extended-care facilities.  People who have sexual contact with a partner who has scabies.  Young children who attend child care facilities.  People who care for others who are at increased risk for scabies. SYMPTOMS Symptoms of this condition may include:  Severe itchiness. This is often worse at night.  A rash that includes tiny red bumps or blisters. The rash commonly occurs on the wrist, elbow, armpit, fingers, waist, groin, or buttocks. Bumps may form a line (burrow) in some areas.  Skin irritation. This can include scaly patches or sores. DIAGNOSIS This condition is  diagnosed with a physical exam. Your health care provider will look closely at your skin. In some cases, your health care provider may take a sample of your affected skin (skin scraping) and have it examined under a microscope. TREATMENT This condition may be treated with:  Medicated cream or lotion that kills the mites. This is spread on the entire body and left on for several hours. Usually, one treatment with medicated cream or lotion is enough to kill all of the mites. In severe cases, the treatment may be repeated.  Medicated cream that relieves itching.  Medicines that help to relieve itching.  Medicines that kill the mites. This treatment is rarely used. HOME CARE INSTRUCTIONS Medicines  Take or apply over-the-counter and prescription medicines as told by your health care provider.  Apply medicated cream or lotion as told by your health care provider.  Do not wash off the medicated cream or lotion until the necessary amount of time has passed. Skin Care  Avoid scratching your affected skin.  Keep your fingernails closely trimmed to reduce injury from scratching.  Take cool baths or apply cool washcloths to help reduce itching. General Instructions  Clean all items that you recently had contact with, including bedding, clothing, and furniture. Do this on the same day that your treatment starts.  Use hot water when you wash items.  Place unwashable items into closed, airtight plastic bags for at least 3 days. The mites cannot live for more than 3 days away from human skin.  Vacuum furniture  and mattresses that you use.  Make sure that other people who may have been infested are examined by a health care provider. These include members of your household and anyone who may have had contact with infested items.  Keep all follow-up visits as told by your health care provider. This is important. SEEK MEDICAL CARE IF:  You have itching that does not go away after 4 weeks of  treatment.  You continue to develop new bumps or burrows.  You have redness, swelling, or pain in your rash area after treatment.  You have fluid, blood, or pus coming from your rash.   This information is not intended to replace advice given to you by your health care provider. Make sure you discuss any questions you have with your health care provider.   Document Released: 10/29/2014 Document Reviewed: 09/09/2014 Elsevier Interactive Patient Education 2016 [place] are tiny bugs that live in and around beds. During the day, they stay hidden. At night, they come out and bite. WHERE ARE BEDBUGS FOUND? Bedbugs can be found anywhere. It does not matter if a place is clean or dirty. They are often found in:  Hotels.  Shelters.  Dorms.  Hospitals.  Nursing homes.  Places where there are many birds or bats. WHAT ARE [place]? A bedbug bite leaves a small red bump with a darker red dot in the middle. The bump may show up soon after a person is bitten or a day or more later. Bedbug bites usually do not hurt, but they may itch. Most people do not need treatment for bedbug bites. The bumps usually go away on their own in a few days. HOW DO I CHECK FOR BEDBUGS? Bedbugs are reddish-brown, oval, and flat. They are very small and they cannot fly. Look for bedbugs in these places:  On mattresses, bed frames, headboards, and box springs.  On drapes and curtains in bedrooms.  Under the carpet in bedrooms.  Behind electrical outlets.  Behind any wallpaper that is peeling.  Inside luggage. Also look for black or red spots or stains on or near the bed. WHAT SHOULD I DO IF I FIND BEDBUGS? When Traveling Check your clothes, suitcase, and belongings for bedbugs before you go back home. You may want to throw away anything that has bedbugs on it. At Home Your bedroom may need to be treated by a pest control expert. You may also need to throw away mattresses or  luggage. To help keep bedbugs from coming back, you may want to:  Put a plastic cover over your mattress.  Wash your clothes and bedding in water that is hotter than 120F (48.9C). Dry them on a hot setting.  Vacuum often around the bed and in all of the cracks where the bugs might hide.  Check all used furniture, bedding, or clothes that you bring into your home.  Get rid of bird nests and bat roosts that are near your home. In Your Bed Try wearing pajamas that have long sleeves and pant legs. Bedbugs usually bite areas of the skin that are not covered.   This information is not intended to replace advice given to you by your health care provider. Make sure you discuss any questions you have with your health care provider.   Document Released: 05/25/2010 Document Revised: 06/24/2014 Document Reviewed: 02/03/2014 Elsevier Interactive Patient Education [employer].

## 2014-12-12 ENCOUNTER — Encounter: Payer: Self-pay | Admitting: Family Medicine

## 2014-12-24 ENCOUNTER — Inpatient Hospital Stay (HOSPITAL_COMMUNITY)
Admission: EM | Admit: 2014-12-24 | Discharge: 2014-12-26 | DRG: 291 | Disposition: A | Discharge status: Home / Self Care | Payer: Medicaid Other | Attending: Cardiology | Admitting: Cardiology

## 2014-12-24 ENCOUNTER — Encounter (HOSPITAL_COMMUNITY): Payer: Self-pay | Admitting: *Deleted

## 2014-12-24 ENCOUNTER — Emergency Department (HOSPITAL_COMMUNITY): Payer: Medicaid Other

## 2014-12-24 DIAGNOSIS — E11319 Type 2 diabetes mellitus with unspecified diabetic retinopathy without macular edema: Secondary | ICD-10-CM | POA: Diagnosis present

## 2014-12-24 DIAGNOSIS — F329 Major depressive disorder, single episode, unspecified: Secondary | ICD-10-CM | POA: Diagnosis present

## 2014-12-24 DIAGNOSIS — I130 Hypertensive heart and chronic kidney disease with heart failure and stage 1 through stage 4 chronic kidney disease, or unspecified chronic kidney disease: Principal | ICD-10-CM | POA: Diagnosis present

## 2014-12-24 DIAGNOSIS — E875 Hyperkalemia: Secondary | ICD-10-CM | POA: Diagnosis present

## 2014-12-24 DIAGNOSIS — Z79899 Other long term (current) drug therapy: Secondary | ICD-10-CM

## 2014-12-24 DIAGNOSIS — F17210 Nicotine dependence, cigarettes, uncomplicated: Secondary | ICD-10-CM | POA: Diagnosis present

## 2014-12-24 DIAGNOSIS — I2510 Atherosclerotic heart disease of native coronary artery without angina pectoris: Secondary | ICD-10-CM | POA: Diagnosis present

## 2014-12-24 DIAGNOSIS — E1122 Type 2 diabetes mellitus with diabetic chronic kidney disease: Secondary | ICD-10-CM | POA: Diagnosis present

## 2014-12-24 DIAGNOSIS — B86 Scabies: Secondary | ICD-10-CM | POA: Diagnosis present

## 2014-12-24 DIAGNOSIS — I509 Heart failure, unspecified: Secondary | ICD-10-CM

## 2014-12-24 DIAGNOSIS — Z7982 Long term (current) use of aspirin: Secondary | ICD-10-CM

## 2014-12-24 DIAGNOSIS — E1151 Type 2 diabetes mellitus with diabetic peripheral angiopathy without gangrene: Secondary | ICD-10-CM | POA: Diagnosis present

## 2014-12-24 DIAGNOSIS — N183 Chronic kidney disease, stage 3 (moderate): Secondary | ICD-10-CM | POA: Diagnosis present

## 2014-12-24 DIAGNOSIS — I5033 Acute on chronic diastolic (congestive) heart failure: Secondary | ICD-10-CM | POA: Diagnosis present

## 2014-12-24 DIAGNOSIS — I272 Other secondary pulmonary hypertension: Secondary | ICD-10-CM | POA: Diagnosis present

## 2014-12-24 DIAGNOSIS — I252 Old myocardial infarction: Secondary | ICD-10-CM

## 2014-12-24 DIAGNOSIS — J449 Chronic obstructive pulmonary disease, unspecified: Secondary | ICD-10-CM | POA: Diagnosis present

## 2014-12-24 DIAGNOSIS — E785 Hyperlipidemia, unspecified: Secondary | ICD-10-CM | POA: Diagnosis present

## 2014-12-24 DIAGNOSIS — D649 Anemia, unspecified: Secondary | ICD-10-CM | POA: Diagnosis present

## 2014-12-24 DIAGNOSIS — I5031 Acute diastolic (congestive) heart failure: Secondary | ICD-10-CM

## 2014-12-24 LAB — CBC
HCT: 29.5 % — ABNORMAL LOW (ref 36.0–46.0)
HCT: 32 % — ABNORMAL LOW (ref 36.0–46.0)
HEMOGLOBIN: 10.1 g/dL — AB (ref 12.0–15.0)
Hemoglobin: 9.2 g/dL — ABNORMAL LOW (ref 12.0–15.0)
MCH: 28.6 pg (ref 26.0–34.0)
MCH: 29.4 pg (ref 26.0–34.0)
MCHC: 31.2 g/dL (ref 30.0–36.0)
MCHC: 31.6 g/dL (ref 30.0–36.0)
MCV: 91.6 fL (ref 78.0–100.0)
MCV: 93 fL (ref 78.0–100.0)
PLATELETS: 274 10*3/uL (ref 150–400)
Platelets: 266 10*3/uL (ref 150–400)
RBC: 3.22 MIL/uL — ABNORMAL LOW (ref 3.87–5.11)
RBC: 3.44 MIL/uL — AB (ref 3.87–5.11)
RDW: 16.3 % — AB (ref 11.5–15.5)
RDW: 16.4 % — ABNORMAL HIGH (ref 11.5–15.5)
WBC: 8.3 10*3/uL (ref 4.0–10.5)
WBC: 9.7 10*3/uL (ref 4.0–10.5)

## 2014-12-24 LAB — GLUCOSE, CAPILLARY
GLUCOSE-CAPILLARY: 86 mg/dL (ref 65–99)
GLUCOSE-CAPILLARY: 174 mg/dL — AB (ref 65–99)

## 2014-12-24 LAB — BASIC METABOLIC PANEL
Anion gap: 11 (ref 5–15)
BUN: 29 mg/dL — AB (ref 6–20)
CALCIUM: 8.8 mg/dL — AB (ref 8.9–10.3)
CO2: 23 mmol/L (ref 22–32)
CREATININE: 1.63 mg/dL — AB (ref 0.44–1.00)
Chloride: 106 mmol/L (ref 101–111)
GFR calc non Af Amer: 32 mL/min — ABNORMAL LOW (ref >60)
GFR, EST AFRICAN AMERICAN: 37 mL/min — AB (ref >60)
Glucose, Bld: 252 mg/dL — ABNORMAL HIGH (ref 65–99)
Potassium: 4.2 mmol/L (ref 3.5–5.1)
SODIUM: 140 mmol/L (ref 135–145)

## 2014-12-24 LAB — BRAIN NATRIURETIC PEPTIDE: B Natriuretic Peptide: 1288.9 pg/mL — ABNORMAL HIGH (ref 0.0–100.0)

## 2014-12-24 LAB — CREATININE, SERUM
CREATININE: 1.62 mg/dL — AB (ref 0.44–1.00)
GFR calc Af Amer: 38 mL/min — ABNORMAL LOW (ref >60)
GFR, EST NON AFRICAN AMERICAN: 33 mL/min — AB (ref >60)

## 2014-12-24 LAB — I-STAT TROPONIN, ED: TROPONIN I, POC: 0.01 ng/mL (ref 0.00–0.08)

## 2014-12-24 LAB — TROPONIN I

## 2014-12-24 LAB — TSH: TSH: 2.015 u[IU]/mL (ref 0.350–4.500)

## 2014-12-24 MED ORDER — FUROSEMIDE 10 MG/ML IJ SOLN
40.0000 mg | INTRAMUSCULAR | Status: AC
  Administered 2014-12-24: 40 mg via INTRAVENOUS
  Filled 2014-12-24: qty 4

## 2014-12-24 MED ORDER — FERROUS SULFATE 325 (65 FE) MG PO TABS
325.0000 mg | ORAL_TABLET | Freq: Three times a day (TID) | ORAL | Status: DC
  Administered 2014-12-24 – 2014-12-26 (×5): 325 mg via ORAL
  Filled 2014-12-24 (×5): qty 1

## 2014-12-24 MED ORDER — PANTOPRAZOLE SODIUM 40 MG PO TBEC
40.0000 mg | DELAYED_RELEASE_TABLET | Freq: Every day | ORAL | Status: DC
  Administered 2014-12-25 – 2014-12-26 (×2): 40 mg via ORAL
  Filled 2014-12-24 (×2): qty 1

## 2014-12-24 MED ORDER — OMEGA-3-ACID ETHYL ESTERS 1 G PO CAPS
1.0000 g | ORAL_CAPSULE | Freq: Three times a day (TID) | ORAL | Status: DC
  Administered 2014-12-24 – 2014-12-26 (×5): 1 g via ORAL
  Filled 2014-12-24 (×5): qty 1

## 2014-12-24 MED ORDER — LOSARTAN POTASSIUM 50 MG PO TABS
50.0000 mg | ORAL_TABLET | Freq: Every day | ORAL | Status: DC
  Administered 2014-12-25 – 2014-12-26 (×2): 50 mg via ORAL
  Filled 2014-12-24 (×2): qty 1

## 2014-12-24 MED ORDER — ONDANSETRON HCL 4 MG/2ML IJ SOLN: 4.0000 mg | Freq: Four times a day (QID) | INTRAMUSCULAR | Status: DC | PRN

## 2014-12-24 MED ORDER — AMLODIPINE BESYLATE 10 MG PO TABS
10.0000 mg | ORAL_TABLET | Freq: Every day | ORAL | Status: DC
  Administered 2014-12-25 – 2014-12-26 (×2): 10 mg via ORAL
  Filled 2014-12-24 (×2): qty 1

## 2014-12-24 MED ORDER — METOPROLOL TARTRATE 50 MG PO TABS
50.0000 mg | ORAL_TABLET | Freq: Two times a day (BID) | ORAL | Status: DC
  Administered 2014-12-24 – 2014-12-26 (×4): 50 mg via ORAL
  Filled 2014-12-24 (×4): qty 1

## 2014-12-24 MED ORDER — ASPIRIN EC 81 MG PO TBEC
81.0000 mg | DELAYED_RELEASE_TABLET | Freq: Every day | ORAL | Status: DC
  Administered 2014-12-25 – 2014-12-26 (×2): 81 mg via ORAL
  Filled 2014-12-24 (×2): qty 1

## 2014-12-24 MED ORDER — SODIUM CHLORIDE 0.9 % IJ SOLN
3.0000 mL | Freq: Two times a day (BID) | INTRAMUSCULAR | Status: DC
  Administered 2014-12-24 – 2014-12-26 (×4): 3 mL via INTRAVENOUS

## 2014-12-24 MED ORDER — PREDNISONE 10 MG PO TABS
50.0000 mg | ORAL_TABLET | Freq: Every day | ORAL | Status: DC
  Administered 2014-12-25 – 2014-12-26 (×2): 50 mg via ORAL
  Filled 2014-12-24: qty 2
  Filled 2014-12-24: qty 1
  Filled 2014-12-24: qty 2
  Filled 2014-12-24: qty 1

## 2014-12-24 MED ORDER — SERTRALINE HCL 100 MG PO TABS
200.0000 mg | ORAL_TABLET | Freq: Every day | ORAL | Status: DC
  Administered 2014-12-25 – 2014-12-26 (×2): 200 mg via ORAL
  Filled 2014-12-24 (×2): qty 2

## 2014-12-24 MED ORDER — POTASSIUM CHLORIDE CRYS ER 20 MEQ PO TBCR
40.0000 meq | EXTENDED_RELEASE_TABLET | Freq: Two times a day (BID) | ORAL | Status: DC
  Administered 2014-12-24 – 2014-12-25 (×3): 40 meq via ORAL
  Filled 2014-12-24 (×3): qty 2

## 2014-12-24 MED ORDER — SODIUM CHLORIDE 0.9 % IJ SOLN: 3.0000 mL | INTRAMUSCULAR | Status: DC | PRN

## 2014-12-24 MED ORDER — ACETAMINOPHEN 325 MG PO TABS: 650.0000 mg | ORAL_TABLET | ORAL | Status: DC | PRN

## 2014-12-24 MED ORDER — OMEGA-3 FATTY ACIDS 1000 MG PO CAPS: 1.0000 g | ORAL_CAPSULE | Freq: Three times a day (TID) | ORAL | Status: DC

## 2014-12-24 MED ORDER — HYDROCORTISONE 1 % EX LOTN
TOPICAL_LOTION | Freq: Two times a day (BID) | CUTANEOUS | Status: DC
  Filled 2014-12-24: qty 118

## 2014-12-24 MED ORDER — HEPARIN SODIUM (PORCINE) 5000 UNIT/ML IJ SOLN
5000.0000 [IU] | Freq: Three times a day (TID) | INTRAMUSCULAR | Status: DC
  Administered 2014-12-24 – 2014-12-26 (×5): 5000 [IU] via SUBCUTANEOUS
  Filled 2014-12-24 (×5): qty 1

## 2014-12-24 MED ORDER — CLONIDINE HCL 0.1 MG PO TABS
0.1000 mg | ORAL_TABLET | Freq: Two times a day (BID) | ORAL | Status: DC
  Administered 2014-12-24 – 2014-12-26 (×4): 0.1 mg via ORAL
  Filled 2014-12-24 (×4): qty 1

## 2014-12-24 MED ORDER — SODIUM CHLORIDE 0.9 % IV SOLN: 250.0000 mL | INTRAVENOUS | Status: DC | PRN

## 2014-12-24 MED ORDER — NITROGLYCERIN 2 % TD OINT
1.0000 [in_us] | TOPICAL_OINTMENT | Freq: Once | TRANSDERMAL | Status: AC
  Administered 2014-12-24: 1 [in_us] via TOPICAL
  Filled 2014-12-24: qty 1

## 2014-12-24 MED ORDER — FUROSEMIDE 10 MG/ML IJ SOLN
80.0000 mg | Freq: Two times a day (BID) | INTRAMUSCULAR | Status: DC
  Administered 2014-12-24 – 2014-12-26 (×4): 80 mg via INTRAVENOUS
  Filled 2014-12-24 (×4): qty 8

## 2014-12-24 MED ORDER — PERMETHRIN 5 % EX CREA
1.0000 "application " | TOPICAL_CREAM | Freq: Once | CUTANEOUS | Status: AC
  Administered 2014-12-24: 1 via TOPICAL
  Filled 2014-12-24: qty 60

## 2014-12-24 NOTE — ED Notes (Signed)
Attempted to call report

## 2014-12-24 NOTE — ED Notes (Signed)
Family at bedside. 

## 2014-12-24 NOTE — H&P (Signed)
Patient ID: [name] MRN: [id number], DOB/AGE: 1950-07-22   Admit date: 12/24/2014   Primary Physician: [name] Palau, MD Primary Cardiologist: Dr. Percival Spanish   Pt. Profile:  [name] is a 64 y.o. female with a history of CAD w/ known occluded RCA, ongoing tobacco abuse, DM, COPD, HLD, HTN, chronic diastolic CHF, CKD, renal artery stenosis, GI AVM s/p ablation, carotid artery disease s/p CEA, PVD s/p left common iliac and external iliac artery stents as well as scabies who presented to [facility] today with shortness of breath and CP.   She had a NSTEMI in the setting of anemia in 2007. She underwent LHC which revealed high-grade single-vessel coronary artery disease with 50% left main stenosis, ostial 60% LAD stenosis, 30% ramus intermediate stenosis, occluded right artery with left-to-right collaterals normal LVEF. She was felt to not be a good candidate forpercutaneous revascularization because of the location of the LAD and it did not appear to be obstructive. The symptoms with her evated troponin were felt most likley related to her anemia and occluded right coronary. She was continued on medical management.   She was last seen by Dr. Percival Spanish in 04/2013. She has not been able to follow up with his since she lost her insurance. She was in her usual state of health until a couple of days ago when she noticed swelling in her legs. She also had shortness of breath and orthopnea. She has been sleeping in her recliner. She denies PND. She also has had intermittent sharp chest pain that comes and goes. It seems to be worse with exertion. No associated worsened SOB, diaphoresis, nausea or radiation. She is currently chest pain free. She also notes some DOE. She does not do much activity but does walk to the neighbors house from time to time and gets a little SOB and notices some chest pain that is relieved with rest.   She was recently diagnosed with scabies and treated with permetherin  cream. She continues to smoke about a half a pack per day.    Problem List  Past Medical History  Diagnosis Date  . Peripheral vascular disease ([place])     status post left common iliac and external iliac artery stents  . Cerebrovascular disease     Dopplers 3/14:  RICA 60-79% (high end of range -  Stable), LICA 00-17%, mod R subclavian stenosis, mod R prox CCA stenosis;  F/u 6 mos  . Diabetes mellitus   . COPD (chronic obstructive pulmonary disease) ([place])   . Hyperlipidemia   . Depression   . Diastolic congestive heart failure ([place])   . Hypertension   . Renal artery stenosis ([facility])     status post  . CAD (coronary artery disease)     50% left main stenosis, ostial 60% LAD stenosis, 30% ramus intermediate stenosis, occluded right artery with left-to-right collaterals  . Iron deficiency anemia   . Diabetic retinopathy ([place])   . 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 Dieaulafoy's lesion 01/25/2012    Past Surgical History  Procedure Laterality Date  . Carotid endarterectomy    . Cholecystectomy    . Appendectomy    . Tonsillectomy    . Cesarean section    . Shoulder arthroscopy    . Iliac artery stent      left  . Enteroscopy  01/26/2012    Procedure: ENTEROSCOPY;  Surgeon: Gatha Mayer,  MD;  Location: [facility] ENDOSCOPY;  Service: Endoscopy;  Laterality: N/A;     Allergies  No Known Allergies   Home Medications  Prior to Admission medications   Medication Sig Start Date End Date Taking? Authorizing Provider  acetaminophen (TYLENOL) 500 MG tablet Take 500 mg by mouth every 6 (six) hours as needed (pain).    Yes Historical Provider, MD  amLODipine (NORVASC) 10 MG tablet Take 10 mg by mouth daily.    Yes Historical Provider, MD  aspirin EC 81 MG tablet Take 81 mg by mouth daily.   Yes Historical Provider, MD  Calcium Carbonate Antacid (TUMS PO) Take 1 tablet by mouth daily as needed (acid  reflux/heartburn).   Yes Historical Provider, MD  cloNIDine (CATAPRES) 0.1 MG tablet Take 0.1 mg by mouth 2 (two) times daily. 11/14/14  Yes Historical Provider, MD  Ferrous Sulfate (IRON) 325 (65 FE) MG TABS Take 325 mg by mouth 3 (three) times daily.    Yes Historical Provider, MD  fish oil-omega-3 fatty acids 1000 MG capsule Take 1 g by mouth 3 (three) times daily. Three times daily   Yes Historical Provider, MD  furosemide (LASIX) 80 MG tablet Take 0.5 tablets (40 mg total) by mouth daily. Patient taking differently: Take 80 mg by mouth daily.  11/05/11  Yes Reyne Dumas, MD  hydrocortisone 2.5 % lotion Apply topically 2 (two) times daily. 12/11/14  Yes Dorena Dew, FNP  losartan (COZAAR) 50 MG tablet Take 1 tablet (50 mg total) by mouth daily. 11/09/14  Yes Velvet Bathe, MD  metoprolol (LOPRESSOR) 50 MG tablet Take 50 mg by mouth 2 (two) times daily. 10/08/14  Yes Historical Provider, MD  pantoprazole (PROTONIX) 40 MG tablet Take 1 tablet (40 mg total) by mouth daily. 01/28/12  Yes Ripudeep Krystal Eaton, MD  predniSONE (DELTASONE) 50 MG tablet Take 1 tablet (50 mg total) by mouth daily with breakfast. 11/09/14  Yes Velvet Bathe, MD  sertraline (ZOLOFT) 100 MG tablet Take 200 mg by mouth daily.    Yes Historical Provider, MD  levofloxacin (LEVAQUIN) 500 MG tablet Take 1 tablet (500 mg total) by mouth every other day. Patient not taking: Reported on 12/24/2014 11/10/14   Velvet Bathe, MD  permethrin (ELIMITE) 5 % cream Apply 1 application topically once. Patient not taking: Reported on 12/24/2014 12/11/14   Dorena Dew, FNP    Family History  Family History  Problem Relation Age of Onset  . Hypertension Mother   . Heart attack Mother   . Hypertension Father   . Angina Father   . Cancer Sister   . Cancer Brother    Family Status  Relation Status Death Age  . Mother Deceased   . Father Deceased   . Sister Deceased   . Brother Deceased      Social History  Social History   Social  History  . Marital Status: Married    Spouse Name: N/A  . Number of Children: N/A  . Years of Education: N/A   Occupational History  . Not on file.   Social History Main Topics  . Smoking status: Current Every Day Smoker -- 1.00 packs/day for 36 years    Types: Cigarettes  . Smokeless tobacco: Never Used     Comment: uses e-cig  . Alcohol Use: No  . Drug Use: No  . Sexual Activity: No   Other Topics Concern  . Not on file   Social History Narrative     Review of Systems  All other systems reviewed and are otherwise negative except as noted above.  Physical Exam  Blood pressure 155/59, pulse 50, temperature 98.7 F (37.1 C), temperature source Oral, resp. rate 17, height 4\' 10"  (1.473 m), SpO2 96 %.  General: [place], NAD. Chronically ill appearing Psych: Normal affect. Neuro: Alert and oriented X 3. Moves all extremities spontaneously. HEENT: Normal  Neck: Supple without bruits or + JVD. Lungs:  Resp regular and unlabored, crackles at bases. Heart: RRR no s3, s4, or murmurs. Abdomen: Soft, non-tender, non-distended, BS + x 4.  Extremities: No clubbing, cyanosis or edema. DP/PT/Radials 2+ and equal bilaterally. 2+ pitting edema. Scabs over dorsums of hands  Labs  No results for input(s): CKTOTAL, CKMB, TROPONINI in the last 72 hours. Lab Results  Component Value Date   WBC 8.3 12/24/2014   HGB 9.2* 12/24/2014   HCT 29.5* 12/24/2014   MCV 91.6 12/24/2014   PLT 274 12/24/2014    Recent Labs Lab 12/24/14 1030  NA 140  K 4.2  CL 106  CO2 23  BUN 29*  CREATININE 1.63*  CALCIUM 8.8*  GLUCOSE 252*     Radiology/Studies  Dg Chest 2 View  12/24/2014  CLINICAL DATA:  Chest pain EXAM: CHEST  2 VIEW COMPARISON:  11/07/2014 FINDINGS: Cardiomegaly again noted. Mild interstitial prominence again noted no convincing pulmonary edema. Streaky bilateral basilar atelectasis or infiltrate. Thoracic spine osteopenia. IMPRESSION: Mild interstitial prominence bilateral  without convincing pulmonary edema. Streaky bilateral basilar atelectasis on infiltrate. Electronically Signed   By: Lahoma Crocker M.D.   On: 12/24/2014 11:09    ECG  Sinus rhythm RAD, Low voltage, precordial leads. Non specific ST changes,similar to previous  Study Date: 11/07/2014 LV EF: 55% -  60% Study Conclusions - Left ventricle: The cavity size was normal. Wall thickness was increased in a pattern of mild LVH. Systolic function was normal. The estimated ejection fraction was in the range of 55% to 60%. Wall motion was normal; there were no regional wall motion abnormalities. Findings consistent with left ventricular diastolic dysfunction. - Aortic valve: Sclerosis without stenosis. There was no significant regurgitation. - Mitral valve: After careful review of all data, I feel there is no definite mitral stenosis. There was trivial regurgitation. - Left atrium: The atrium was mildly dilated. - Right ventricle: The cavity size was mildly dilated. Systolic function was normal. - Right atrium: The atrium was mildly dilated. - Tricuspid valve: There was mild regurgitation. - Pulmonary arteries: PA peak pressure: 57 mm Hg (S).  ASSESSMENT AND PLAN  [name] is a 64 y.o. female with a history of CAD w/ known occluded RCA, ongoing tobacco abuse, DM, COPD, HLD, HTN, chronic diastolic CHF, CKD, renal artery stenosis, GI AVM s/p ablation, carotid artery disease s/p CEA, PVD s/p left common iliac and external iliac artery stents as well as scabies who presented to [facility] today with shortness of breath and CP.   Acute on chronic diastolic CHF: recent 2D ECHO 10/2014 with EF 55-60%, mild LVH, no RWMA, finding c/w diastolic dysfunction. Mild LA/RA dilation, PA pk pressure: 57 mg Hg.  -- She has s/s CHF, BNP elevated to 1288 and pulmonary edema on CXR.  -- No need to repeat 2D ECHO.  -- Will start 80mg  IV Lasix BID with K suppl  Chest pain/CAD- she has a known RCA chronic  occlusion and non obst dz elsewhere. Troponin neg x1. ECG w/ no acute ST or TW changes. Continue to monitor and cycle cardiac enzymes. Last  nuclear stress test in 2013. If she continues to have chest pain and rules out, we could consider ischemic eval.  -- Cont ASA and BB. Consider adding a statin in the setting of known CAD.   HTN- BP mildly elevated. Continue clonidine 01.mg BID, amlodipine 10mg , losartan 50mg , and lopressor 50mg  BID. Cannot increase BB due to bradycardia. Do not want to increase losartan with CKD. Will see how she does with IV lasix. If her BP remains elevated may think about adding another agent.   CKD-creat 1.63. Continue to monitor is the setting of IV diuresis.   Scabies- She was recently diagnosed with scabies and treated with permetherin cream.  Tobacco abuse- she continues to smoke about a half a pack per day. Counseled on cessation.    Judy Pimple, PA-C 12/24/2014, 2:00 PM  Pager [phone]  Personally seen and examined. Agree with above. Acute diastolic HF  - Lasix 80 IV BID (on 80PO at home)  - watch creat 1.6  - should help with HTN as well.  Treated Scabies   - curbsided ID, no need to repeat cream  - her apt was fumagated as well Atypical CP  - cycle trop Hopefully no further testing. Recent ECHO normal EF.   Candee Furbish, MD

## 2014-12-24 NOTE — ED Provider Notes (Signed)
Complains of shortness of breath worsening since this morning. Feels like congestive heart failure in the past. Other associated symptoms include 3 pillow orthopnea. She had chest pain today lasting 5 minutes which resolved spontaneously. She reports getting similar chest pains once per week.  Orlie Dakin, MD 12/24/14 [phone]

## 2014-12-24 NOTE — ED Provider Notes (Signed)
CSN: [id number]     Arrival date & time 12/24/14  1191 History   First MD Initiated Contact with Patient 12/24/14 [phone]     Chief Complaint  Patient presents with  . Congestive Heart Failure  . Chest Pain     (Consider location/radiation/quality/duration/timing/severity/associated sxs/prior Treatment) HPI Comments: Patient with PMH of CAD, COPD, CHF, HTN, HL presents emergency department with chief complaint of chest pain and shortness of breath. She states that her symptoms been progressively worsening since 2 days ago. She states that she has had increased swelling on her legs as well as increased difficulty breathing. Symptoms are worsened with exertion. She takes 80 mg of Lasix daily. She is followed by cardiology, Dr. Percival Spanish.  She denies any other associated symptoms.  The history is provided by the patient. No language interpreter was used.    Past Medical History  Diagnosis Date  . Peripheral vascular disease ([facility])     status post left common iliac and external iliac artery stents  . Cerebrovascular disease     Dopplers 3/14:  RICA 60-79% (high end of range -  Stable), LICA 95-62%, mod R subclavian stenosis, mod R prox CCA stenosis;  F/u 6 mos  . Diabetes mellitus   . COPD (chronic obstructive pulmonary disease) ([place])   . Hyperlipidemia   . Depression   . Diastolic congestive heart failure ([place])   . Hypertension   . Renal artery stenosis ([facility])     status post  . CAD (coronary artery disease)     50% left main stenosis, ostial 60% LAD stenosis, 30% ramus intermediate stenosis, occluded right artery with left-to-right collaterals  . Iron deficiency anemia   . Diabetic retinopathy ([place])   . 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 Dieaulafoy's lesion 01/25/2012   Past Surgical History  Procedure Laterality Date  . Carotid endarterectomy    . Cholecystectomy    . Appendectomy    . Tonsillectomy     . Cesarean section    . Shoulder arthroscopy    . Iliac artery stent      left  . Enteroscopy  01/26/2012    Procedure: ENTEROSCOPY;  Surgeon: Gatha Mayer, MD;  Location: [facility];  Service: Endoscopy;  Laterality: N/A;   Family History  Problem Relation Age of Onset  . Hypertension Mother   . Heart attack Mother   . Hypertension Father   . Angina Father   . Cancer Sister   . Cancer Brother    Social History  Substance Use Topics  . Smoking status: Current Every Day Smoker -- 1.00 packs/day for 36 years    Types: Cigarettes  . Smokeless tobacco: Never Used     Comment: uses e-cig  . Alcohol Use: No   OB History    No data available     Review of Systems  Constitutional: Negative for fever and chills.  Respiratory: Positive for shortness of breath.   Cardiovascular: Positive for chest pain and leg swelling.  Gastrointestinal: Negative for nausea, vomiting, diarrhea and constipation.  Genitourinary: Negative for dysuria.  All other systems reviewed and are negative.     Allergies  Review of patient's allergies indicates no known allergies.  Home Medications   Prior to Admission medications   Medication Sig Start Date End Date Taking? Authorizing Provider  acetaminophen (TYLENOL) 500 MG tablet Take 500 mg by mouth every 6 (six) hours as needed (pain).  Historical Provider, MD  amLODipine (NORVASC) 10 MG tablet Take 10 mg by mouth daily.     Historical Provider, MD  aspirin EC 81 MG tablet Take 81 mg by mouth daily.    Historical Provider, MD  Calcium Carbonate Antacid (TUMS PO) Take 1 tablet by mouth daily as needed (acid reflux/heartburn).    Historical Provider, MD  Ferrous Sulfate (IRON) 325 (65 FE) MG TABS Take 325 mg by mouth 3 (three) times daily.     Historical Provider, MD  fish oil-omega-3 fatty acids 1000 MG capsule Take 1 g by mouth 3 (three) times daily. Three times daily    Historical Provider, MD  furosemide (LASIX) 80 MG tablet Take 0.5  tablets (40 mg total) by mouth daily. Patient taking differently: Take 80 mg by mouth daily.  11/05/11   Reyne Dumas, MD  hydrocortisone 2.5 % lotion Apply topically 2 (two) times daily. 12/11/14   Dorena Dew, FNP  levofloxacin (LEVAQUIN) 500 MG tablet Take 1 tablet (500 mg total) by mouth every other day. 11/10/14   Velvet Bathe, MD  losartan (COZAAR) 50 MG tablet Take 1 tablet (50 mg total) by mouth daily. 11/09/14   Velvet Bathe, MD  metoprolol (LOPRESSOR) 50 MG tablet Take 50 mg by mouth 2 (two) times daily. 10/08/14   Historical Provider, MD  pantoprazole (PROTONIX) 40 MG tablet Take 1 tablet (40 mg total) by mouth daily. 01/28/12   Ripudeep Krystal Eaton, MD  permethrin (ELIMITE) 5 % cream Apply 1 application topically once. 12/11/14   Dorena Dew, FNP  predniSONE (DELTASONE) 50 MG tablet Take 1 tablet (50 mg total) by mouth daily with breakfast. 11/09/14   Velvet Bathe, MD  sertraline (ZOLOFT) 100 MG tablet Take 200 mg by mouth daily.     Historical Provider, MD   BP 162/43 mmHg  Pulse 52  Temp(Src) 98.7 F (37.1 C) (Oral)  Resp 18  Ht 4\' 10"  (1.473 m)  SpO2 94% Physical Exam  Constitutional: She is oriented to person, place, and time. She appears well-developed and well-nourished.  HENT:  Head: Normocephalic and atraumatic.  Eyes: Conjunctivae and EOM are normal. Pupils are equal, round, and reactive to light.  Neck: Normal range of motion. Neck supple.  Cardiovascular: Normal rate and regular rhythm.  Exam reveals no gallop and no friction rub.   No murmur heard. Pulmonary/Chest: Effort normal and breath sounds normal. No respiratory distress. She has no wheezes. She has no rales. She exhibits no tenderness.  Abdominal: Soft. Bowel sounds are normal. She exhibits no distension and no mass. There is no tenderness. There is no rebound and no guarding.  Musculoskeletal: Normal range of motion. She exhibits no edema or tenderness.  Neurological: She is alert and oriented to person,  place, and time.  Skin: Skin is warm and dry.  Psychiatric: She has a normal mood and affect. Her behavior is normal. Judgment and thought content normal.  Nursing note and vitals reviewed.   ED Course  Procedures (including critical care time) Results for orders placed or performed during the hospital encounter of 12/24/14  Brain natriuretic peptide  Result Value Ref Range   B Natriuretic Peptide 1288.9 (H) 0.0 - 100.0 pg/mL  CBC  Result Value Ref Range   WBC 8.3 4.0 - 10.5 K/uL   RBC 3.22 (L) 3.87 - 5.11 MIL/uL   Hemoglobin 9.2 (L) 12.0 - 15.0 g/dL   HCT 29.5 (L) 36.0 - 46.0 %   MCV 91.6 78.0 - 100.0 fL  MCH 28.6 26.0 - 34.0 pg   MCHC 31.2 30.0 - 36.0 g/dL   RDW 16.3 (H) 11.5 - 15.5 %   Platelets 274 150 - 400 K/uL  Basic metabolic panel  Result Value Ref Range   Sodium 140 135 - 145 mmol/L   Potassium 4.2 3.5 - 5.1 mmol/L   Chloride 106 101 - 111 mmol/L   CO2 23 22 - 32 mmol/L   Glucose, Bld 252 (H) 65 - 99 mg/dL   BUN 29 (H) 6 - 20 mg/dL   Creatinine, Ser 1.63 (H) 0.44 - 1.00 mg/dL   Calcium 8.8 (L) 8.9 - 10.3 mg/dL   GFR calc non Af Amer 32 (L) >60 mL/min   GFR calc Af Amer 37 (L) >60 mL/min   Anion gap 11 5 - 15  I-stat troponin, ED  Result Value Ref Range   Troponin i, poc 0.01 0.00 - 0.08 ng/mL   Comment 3           Dg Chest 2 View  12/24/2014  CLINICAL DATA:  Chest pain EXAM: CHEST  2 VIEW COMPARISON:  11/07/2014 FINDINGS: Cardiomegaly again noted. Mild interstitial prominence again noted no convincing pulmonary edema. Streaky bilateral basilar atelectasis or infiltrate. Thoracic spine osteopenia. IMPRESSION: Mild interstitial prominence bilateral without convincing pulmonary edema. Streaky bilateral basilar atelectasis on infiltrate. Electronically Signed   By: Lahoma Crocker M.D.   On: 12/24/2014 11:09    I have personally reviewed and evaluated these images and lab results as part of my medical decision-making.   EKG Interpretation   Date/Time:  Wednesday  December 24 2014 09:56:58 EDT Ventricular Rate:  52 PR Interval:  134 QRS Duration: 98 QT Interval:  522 QTC Calculation: 485 R Axis:   122 Text Interpretation:  Sinus rhythm Right axis deviation Low voltage,  precordial leads Borderline repolarization abnormality Borderline  prolonged QT interval Baseline wander in lead(s) V6 No significant change  since last tracing \\E \ Confirmed by Winfred Leeds  MD, SAM [phone]) on  12/24/2014 11:13:56 AM      MDM   Final diagnoses:  Acute on chronic congestive heart failure, unspecified congestive heart failure type ([place])   Patient with SOB and CP.  Increased fluid on lower extremities.  Worsening symptoms.  Will check labs and reassess.  BNP is 1200.  Worsening SOB.  Mild hypoxia to 88%.  Normal O2 sat on 2L.    Patient seen by and discussed with Dr. Winfred Leeds, who recommends admission.  Dr. Winfred Leeds spoke with cardmaster.  Patient to be admitted by cardiology.  Montine Circle, PA-C 12/24/14 1533  Orlie Dakin, MD 12/24/14 [phone]

## 2014-12-24 NOTE — ED Notes (Signed)
Ambulated pt on the unit pt saturations remained at 92% room-air pt states she felts SOB while ambulating no other complaints noted at this time

## 2014-12-24 NOTE — ED Notes (Signed)
PT reports SHOB and CP started 2 days ago. Pt reports she has CHF and has been sleeping on 3 pillows at night . Pt also has bil. Ankle swelling.  Pt's back and legs are covered with bed bug bites per information from Pt. Pt reports she has been treated for bed bugs.

## 2014-12-25 DIAGNOSIS — F329 Major depressive disorder, single episode, unspecified: Secondary | ICD-10-CM | POA: Diagnosis present

## 2014-12-25 DIAGNOSIS — N183 Chronic kidney disease, stage 3 (moderate): Secondary | ICD-10-CM | POA: Diagnosis present

## 2014-12-25 DIAGNOSIS — Z720 Tobacco use: Secondary | ICD-10-CM

## 2014-12-25 DIAGNOSIS — B86 Scabies: Secondary | ICD-10-CM | POA: Diagnosis present

## 2014-12-25 DIAGNOSIS — J449 Chronic obstructive pulmonary disease, unspecified: Secondary | ICD-10-CM | POA: Diagnosis present

## 2014-12-25 DIAGNOSIS — E875 Hyperkalemia: Secondary | ICD-10-CM | POA: Diagnosis present

## 2014-12-25 DIAGNOSIS — J438 Other emphysema: Secondary | ICD-10-CM

## 2014-12-25 DIAGNOSIS — I5033 Acute on chronic diastolic (congestive) heart failure: Secondary | ICD-10-CM | POA: Diagnosis present

## 2014-12-25 DIAGNOSIS — I130 Hypertensive heart and chronic kidney disease with heart failure and stage 1 through stage 4 chronic kidney disease, or unspecified chronic kidney disease: Secondary | ICD-10-CM | POA: Diagnosis present

## 2014-12-25 DIAGNOSIS — D649 Anemia, unspecified: Secondary | ICD-10-CM | POA: Diagnosis present

## 2014-12-25 DIAGNOSIS — I2510 Atherosclerotic heart disease of native coronary artery without angina pectoris: Secondary | ICD-10-CM | POA: Diagnosis present

## 2014-12-25 DIAGNOSIS — E785 Hyperlipidemia, unspecified: Secondary | ICD-10-CM | POA: Diagnosis present

## 2014-12-25 DIAGNOSIS — Z79899 Other long term (current) drug therapy: Secondary | ICD-10-CM | POA: Diagnosis not present

## 2014-12-25 DIAGNOSIS — R0602 Shortness of breath: Secondary | ICD-10-CM | POA: Diagnosis present

## 2014-12-25 DIAGNOSIS — I272 Other secondary pulmonary hypertension: Secondary | ICD-10-CM | POA: Diagnosis present

## 2014-12-25 DIAGNOSIS — F17210 Nicotine dependence, cigarettes, uncomplicated: Secondary | ICD-10-CM | POA: Diagnosis present

## 2014-12-25 DIAGNOSIS — I252 Old myocardial infarction: Secondary | ICD-10-CM | POA: Diagnosis not present

## 2014-12-25 DIAGNOSIS — E1151 Type 2 diabetes mellitus with diabetic peripheral angiopathy without gangrene: Secondary | ICD-10-CM | POA: Diagnosis present

## 2014-12-25 DIAGNOSIS — E11319 Type 2 diabetes mellitus with unspecified diabetic retinopathy without macular edema: Secondary | ICD-10-CM | POA: Diagnosis present

## 2014-12-25 DIAGNOSIS — Z7982 Long term (current) use of aspirin: Secondary | ICD-10-CM | POA: Diagnosis not present

## 2014-12-25 DIAGNOSIS — E1122 Type 2 diabetes mellitus with diabetic chronic kidney disease: Secondary | ICD-10-CM | POA: Diagnosis present

## 2014-12-25 LAB — BASIC METABOLIC PANEL
ANION GAP: 7 (ref 5–15)
BUN: 32 mg/dL — ABNORMAL HIGH (ref 6–20)
CALCIUM: 8.7 mg/dL — AB (ref 8.9–10.3)
CHLORIDE: 108 mmol/L (ref 101–111)
CO2: 25 mmol/L (ref 22–32)
Creatinine, Ser: 1.7 mg/dL — ABNORMAL HIGH (ref 0.44–1.00)
GFR calc Af Amer: 36 mL/min — ABNORMAL LOW (ref >60)
GFR calc non Af Amer: 31 mL/min — ABNORMAL LOW (ref >60)
GLUCOSE: 123 mg/dL — AB (ref 65–99)
Potassium: 4.9 mmol/L (ref 3.5–5.1)
Sodium: 140 mmol/L (ref 135–145)

## 2014-12-25 LAB — GLUCOSE, CAPILLARY
GLUCOSE-CAPILLARY: 285 mg/dL — AB (ref 65–99)
Glucose-Capillary: 131 mg/dL — ABNORMAL HIGH (ref 65–99)
Glucose-Capillary: 243 mg/dL — ABNORMAL HIGH (ref 65–99)
Glucose-Capillary: 206 mg/dL — ABNORMAL HIGH (ref 65–99)

## 2014-12-25 LAB — TROPONIN I: Troponin I: <0.03 ng/mL (ref <0.031)

## 2014-12-25 MED ORDER — INSULIN ASPART 100 UNIT/ML ~~LOC~~ SOLN
0.0000 [IU] | Freq: Three times a day (TID) | SUBCUTANEOUS | Status: DC
  Administered 2014-12-25: 8 [IU] via SUBCUTANEOUS
  Administered 2014-12-25: 5 [IU] via SUBCUTANEOUS
  Administered 2014-12-26: 2 [IU] via SUBCUTANEOUS
  Administered 2014-12-26: 3 [IU] via SUBCUTANEOUS

## 2014-12-25 MED ORDER — GLUCERNA SHAKE PO LIQD
237.0000 mL | Freq: Two times a day (BID) | ORAL | Status: DC
  Administered 2014-12-25 (×2): 237 mL via ORAL

## 2014-12-25 MED ORDER — INSULIN ASPART 100 UNIT/ML ~~LOC~~ SOLN
0.0000 [IU] | Freq: Every day | SUBCUTANEOUS | Status: DC
  Administered 2014-12-25: 2 [IU] via SUBCUTANEOUS

## 2014-12-25 NOTE — Progress Notes (Signed)
RN notified care team of current CBG of 247, Will await further orders

## 2014-12-25 NOTE — Progress Notes (Signed)
Subjective:  Feeling better, much less short of breath.  Objective:  Vital Signs in the last 24 hours: Temp:  [98.4 F (36.9 C)-99.4 F (37.4 C)] 98.4 F (36.9 C) (11/03 0500) Pulse Rate:  [46-63] 52 (11/03 0500) Resp:  [12-19] 17 (11/02 1712) BP: (139-174)/(48-79) 172/64 mmHg (11/03 0500) SpO2:  [87 %-99 %] 97 % (11/03 0500) Weight:  [134 lb 8 oz (61.009 kg)-136 lb 9.6 oz (61.961 kg)] 134 lb 8 oz (61.009 kg) (11/03 0500)  Intake/Output from previous day: 11/02 0701 - 11/03 0700 In: 480 [P.O.:480] Out: 3200 [Urine:3200]   Physical Exam: General: Well developed, well nourished, in no acute distress. Head:  Normocephalic and atraumatic. Lungs: Clear to auscultation and percussion. Minor crackles heard at bases Heart: Normal S1 and S2.  No murmur, rubs or gallops.  Abdomen: soft, non-tender, positive bowel sounds. Extremities: No clubbing or cyanosis. No edema. Neurologic: Alert and oriented x 3.    Lab Results:  Recent Labs  12/24/14 1030 12/24/14 1850  WBC 8.3 9.7  HGB 9.2* 10.1*  PLT 274 266    Recent Labs  12/24/14 1030 12/24/14 1850 12/25/14 0522  NA 140  --  140  K 4.2  --  4.9  CL 106  --  108  CO2 23  --  25  GLUCOSE 252*  --  123*  BUN 29*  --  32*  CREATININE 1.63* 1.62* 1.70*    Recent Labs  12/24/14 1850 12/25/14 0522  TROPONINI <0.03 <0.03   Hepatic Function Panel No results for input(s): PROT, ALBUMIN, AST, ALT, ALKPHOS, BILITOT, BILIDIR, IBILI in the last 72 hours. No results for input(s): CHOL in the last 72 hours. No results for input(s): PROTIME in the last 72 hours.  Imaging: Dg Chest 2 View  12/24/2014  CLINICAL DATA:  Chest pain EXAM: CHEST  2 VIEW COMPARISON:  11/07/2014 FINDINGS: Cardiomegaly again noted. Mild interstitial prominence again noted no convincing pulmonary edema. Streaky bilateral basilar atelectasis or infiltrate. Thoracic spine osteopenia. IMPRESSION: Mild interstitial prominence bilateral without convincing  pulmonary edema. Streaky bilateral basilar atelectasis on infiltrate. Electronically Signed   By: Lahoma Crocker M.D.   On: 12/24/2014 11:09   Personally viewed.   Telemetry: No adverse arrhythmias Personally viewed.   EKG:  Sinus bradycardia, nonspecific ST-T wave changes  Cardiac Studies:  11/07/14 echocardiogram-normal ejection fraction, pulmonary hypertension  Catheterization in 2007:NSTEMI in the setting of anemia in 2007. She underwent LHC which revealed high-grade single-vessel coronary artery disease with 50% left main stenosis, ostial 60% LAD stenosis, 30% ramus intermediate stenosis, occluded right artery with left-to-right collaterals normal LVEF. She was felt to not be a good candidate forpercutaneous revascularization because of the location of the LAD and it did not appear to be obstructive. The symptoms with her evated troponin were felt most likley related to her anemia and occluded right coronary. She was continued on medical management.  Assessment/Plan:  Active Problems:   CHF (congestive heart failure) ([facility])   acute diastolic congestive heart failure  - Continue with IV Lasix 80 mg twice a day  - Potential discharge tomorrow  - BUN and creatinine have slightly increased.   essential hypertension - - Continue with good blood pressure control  - Elevated this morning but she has not received medications yet. - - I would assume that with Lasix her blood pressure will decrease.  History of recent scabies  - Responded well to permethrin cream  - House has been fumigated  - Her  husband did not have scabies but her son did  Chronic kidney disease stage III  - Stable  CAD-known occluded RCA, no ischemic changes  Ongoing tobacco use-COPD with secondary pulmonary hypertension    Alessandra Sawdey 12/25/2014, 10:58 AM

## 2014-12-25 NOTE — Care Management Note (Signed)
Case Management Note  Patient Details  Name: [name] MRN: [id number] Date of Birth: 03-16-1950  Subjective/Objective:  Pt admitted for CHF- Initiated on IV lasix. Pt is without insurance and has been to the [facility] Address: [address], [address], [place] [address] for hospital f/u. CM did schedule an appointment with the clinic and placed on the AVS.                     Action/Plan: CM received consult for HF Screen: Pt may benefit from [facility] RN once stable for d/c. Pt will benefit from all generic medications once stable. CM will continue to monitor.    Expected Discharge Date:                  Expected Discharge Plan:  Home with [place] In-House Referral:  NA  Discharge planning Services  CM Consult, [facility]  Post Acute Care Choice:  NA Choice offered to:  NA  DME Arranged:  N/A DME Agency:  NA  HH Arranged:    [place] Agency:     Status of Service:  In process, will continue to follow  Medicare Important Message Given:    Date Medicare IM Given:    Medicare IM give by:    Date Additional Medicare IM Given:    Additional Medicare Important Message give by:     If discussed at [place] of Stay Meetings, dates discussed:    Additional Comments:  Bethena Roys, RN 12/25/2014, 3:49 PM

## 2014-12-25 NOTE — Progress Notes (Signed)
Initial Nutrition Assessment  DOCUMENTATION CODES:   Not applicable  INTERVENTION:    Glucerna Shake PO BID, each supplement provides 220 kcal and 10 grams of protein  NUTRITION DIAGNOSIS:   Inadequate oral intake related to poor appetite as evidenced by meal completion < 50%.  GOAL:   Patient will meet greater than or equal to 90% of their needs  MONITOR:   PO intake, Supplement acceptance, Labs, Weight trends  REASON FOR ASSESSMENT:   Malnutrition Screening Tool    ASSESSMENT:   64 y.o. female with a history of CAD w/ known occluded RCA, ongoing tobacco abuse, DM, COPD, HLD, HTN, chronic diastolic CHF, CKD, renal artery stenosis, GI AVM s/p ablation, carotid artery disease s/p CEA, PVD s/p left common iliac and external iliac artery stents as well as scabies who presented to [facility] on 11/2 with shortness of breath and CP related to CHF.   Patient reports poor appetite, poor intake, and weight loss over the past few months. She usually weighs ~146 lbs, now down to 134 lbs. She reports eating ~50% of meals today. She has had Ensure in the past and likes it, "but they took me off it." She agreed to try Glucerna Shakes. Unable to complete Nutrition-Focused physical exam at this time.   Diet Order:  Diet Carb Modified Fluid consistency:: Thin; Room service appropriate?: Yes  Skin:  Reviewed, no issues  Last BM:  11/1  Height:   Ht Readings from Last 1 Encounters:  12/24/14 4\' 10"  (1.473 m)    Weight:   Wt Readings from Last 1 Encounters:  12/25/14 134 lb 8 oz (61.009 kg)    Ideal Body Weight:  44 kg  BMI:  Body mass index is 28.12 kg/(m^2).  Estimated Nutritional Needs:   Kcal:  1350-1550  Protein:  75-85 gm  Fluid:  1.5 L  EDUCATION NEEDS:   Education needs addressed  Molli Barrows, [place], [place], [place] Pager [phone] After Hours Pager [phone]

## 2014-12-26 ENCOUNTER — Telehealth: Payer: Self-pay | Admitting: Cardiology

## 2014-12-26 DIAGNOSIS — I10 Essential (primary) hypertension: Secondary | ICD-10-CM

## 2014-12-26 DIAGNOSIS — E875 Hyperkalemia: Secondary | ICD-10-CM

## 2014-12-26 LAB — BASIC METABOLIC PANEL
ANION GAP: 9 (ref 5–15)
BUN: 40 mg/dL — ABNORMAL HIGH (ref 6–20)
CHLORIDE: 104 mmol/L (ref 101–111)
CO2: 24 mmol/L (ref 22–32)
Calcium: 8.6 mg/dL — ABNORMAL LOW (ref 8.9–10.3)
Creatinine, Ser: 1.84 mg/dL — ABNORMAL HIGH (ref 0.44–1.00)
GFR calc non Af Amer: 28 mL/min — ABNORMAL LOW (ref >60)
GFR, EST AFRICAN AMERICAN: 32 mL/min — AB (ref >60)
Glucose, Bld: 166 mg/dL — ABNORMAL HIGH (ref 65–99)
Potassium: 6.5 mmol/L (ref 3.5–5.1)
SODIUM: 137 mmol/L (ref 135–145)

## 2014-12-26 LAB — GLUCOSE, CAPILLARY
Glucose-Capillary: 140 mg/dL — ABNORMAL HIGH (ref 65–99)
Glucose-Capillary: 163 mg/dL — ABNORMAL HIGH (ref 65–99)

## 2014-12-26 LAB — HEMOGLOBIN A1C
Hgb A1c MFr Bld: 6 % — ABNORMAL HIGH (ref 4.8–5.6)
Mean Plasma Glucose: 126 mg/dL

## 2014-12-26 LAB — POTASSIUM: POTASSIUM: 4.9 mmol/L (ref 3.5–5.1)

## 2014-12-26 MED ORDER — SODIUM POLYSTYRENE SULFONATE 15 GM/60ML PO SUSP
30.0000 g | Freq: Once | ORAL | Status: AC
  Administered 2014-12-26: 30 g via ORAL
  Filled 2014-12-26: qty 120

## 2014-12-26 MED ORDER — FUROSEMIDE 80 MG PO TABS: 40.0000 mg | ORAL_TABLET | Freq: Two times a day (BID) | ORAL | Status: DC

## 2014-12-26 NOTE — Progress Notes (Signed)
Subjective:  Feeling better, much less short of breath again today. K 6.5. Kaex given recheck.   Objective:  Vital Signs in the last 24 hours: Temp:  [98.7 F (37.1 C)-99.2 F (37.3 C)] 99.2 F (37.3 C) (11/04 0503) Pulse Rate:  [50-51] 50 (11/04 0503) Resp:  [16-18] 16 (11/04 0503) BP: (135-145)/(62-75) 145/73 mmHg (11/04 0503) SpO2:  [93 %-96 %] 93 % (11/04 0503) Weight:  [136 lb 6.4 oz (61.871 kg)] 136 lb 6.4 oz (61.871 kg) (11/04 0503)  Intake/Output from previous day: 11/03 0701 - 11/04 0700 In: 243 [P.O.:240; I.V.:3] Out: 1600 [Urine:1600]   Physical Exam: General: Well developed, well nourished, in no acute distress. Head:  Normocephalic and atraumatic. Lungs: Clear to auscultation and percussion. No crackles heard at bases Heart: Normal S1 and S2.  No murmur, rubs or gallops.  Abdomen: soft, non-tender, positive bowel sounds. Extremities: No clubbing or cyanosis. No edema. Dried scabies Neurologic: Alert and oriented x 3.    Lab Results:  Recent Labs  12/24/14 1030 12/24/14 1850  WBC 8.3 9.7  HGB 9.2* 10.1*  PLT 274 266    Recent Labs  12/25/14 0522 12/26/14 0336  NA 140 137  K 4.9 6.5*  CL 108 104  CO2 25 24  GLUCOSE 123* 166*  BUN 32* 40*  CREATININE 1.70* 1.84*    Recent Labs  12/24/14 1850 12/25/14 0522  TROPONINI <0.03 <0.03   Hepatic Function Panel No results for input(s): PROT, ALBUMIN, AST, ALT, ALKPHOS, BILITOT, BILIDIR, IBILI in the last 72 hours. No results for input(s): CHOL in the last 72 hours. No results for input(s): PROTIME in the last 72 hours.  Imaging: Dg Chest 2 View  12/24/2014  CLINICAL DATA:  Chest pain EXAM: CHEST  2 VIEW COMPARISON:  11/07/2014 FINDINGS: Cardiomegaly again noted. Mild interstitial prominence again noted no convincing pulmonary edema. Streaky bilateral basilar atelectasis or infiltrate. Thoracic spine osteopenia. IMPRESSION: Mild interstitial prominence bilateral without convincing pulmonary  edema. Streaky bilateral basilar atelectasis on infiltrate. Electronically Signed   By: Lahoma Crocker M.D.   On: 12/24/2014 11:09   Personally viewed.   Telemetry: No adverse arrhythmias Personally viewed.   EKG:  Sinus bradycardia, nonspecific ST-T wave changes  Cardiac Studies:  11/07/14 echocardiogram-normal ejection fraction, pulmonary hypertension  Catheterization in 2007:NSTEMI in the setting of anemia in 2007. She underwent LHC which revealed high-grade single-vessel coronary artery disease with 50% left main stenosis, ostial 60% LAD stenosis, 30% ramus intermediate stenosis, occluded right artery with left-to-right collaterals normal LVEF. She was felt to not be a good candidate forpercutaneous revascularization because of the location of the LAD and it did not appear to be obstructive. The symptoms with her evated troponin were felt most likley related to her anemia and occluded right coronary. She was continued on medical management.  Assessment/Plan:  Active Problems:   CHF (congestive heart failure) (HCC)   acute diastolic congestive heart failure  - Stop IV Lasix 80 mg twice a day  - Discharge today if K OK on recheck  - BUN and creatinine have slightly increased.  - OK to resume home lasix 40mg  BID on DC  - Watch BMET   essential hypertension - - Continue with good blood pressure control  - Elevated this morning but she has not received medications yet. - - I would assume that with Lasix her blood pressure will decrease.  History of recent scabies  - Responded well to permethrin cream  - House has been fumigated  -  Her husband did not have scabies but her son did  Chronic kidney disease stage III  - Stable  CAD-known occluded RCA, no ischemic changes  Ongoing tobacco use-COPD with secondary pulmonary hypertension  Hyperkalemia  - recheck K at noon  - if OK home  - Kaex given  SKAINS, MARK 12/26/2014, 10:13 AM

## 2014-12-26 NOTE — Progress Notes (Signed)
Pt discharged home. Medication regimen understood.

## 2014-12-26 NOTE — Progress Notes (Signed)
Inpatient Diabetes Program Recommendations  AACE/ADA: New Consensus Statement on Inpatient Glycemic Control (2015)  Target Ranges:  Prepandial:   less than 140 mg/dL      Peak postprandial:   less than 180 mg/dL (1-2 hours)      Critically ill patients:  140 - 180 mg/dL   Review of Glycemic Control: Results for [name], [name] (MRN [id number]) as of 12/26/2014 12:58  Ref. Range 12/25/2014 11:53 12/25/2014 16:23 12/25/2014 21:43 12/26/2014 07:40 12/26/2014 11:32  Glucose-Capillary Latest Ref Range: 65-99 mg/dL 285 (H) 243 (H) 206 (H) 140 (H) 163 (H)  Results for ARDENA, GANGL (MRN [id number]) as of 12/26/2014 12:58  Ref. Range 12/24/2014 18:50  Hemoglobin A1C Latest Ref Range: 4.8-5.6 % 6.0 (H)   Diabetes history: Diabetes mellitus Outpatient Diabetes medications: None noted Current orders for Inpatient glycemic control:  Novolog moderate tid with meals and HS, Prednisone 50 mg daily  Inpatient Diabetes Program Recommendations:   While patient is on PO Prednisone, consider adding Novolog meal coverage 2 units tid with meals.   Thanks, Adah Perl, RN, BC-ADM Inpatient Diabetes Coordinator Pager [phone] (8a-5p)

## 2014-12-26 NOTE — Progress Notes (Signed)
Kayexalate ordered for hyperkalemia.  Recheck at 1200hrs.   Kierston Plasencia, PAC

## 2014-12-26 NOTE — Progress Notes (Signed)
CRITICAL VALUE ALERT  Critical value received:  Potassium 6.5  Date of notification:  12/26/2014  Time of notification:  0601  Critical value read back: yes  Nurse who received alert:  Kerrie Buffalo  MD notified (1st page):  B Hagar   Time of first page:  0604  MD notified (2nd page): B Hagar  Time of second page: [phone]  Responding MD:  Orson Slick  Time MD responded:  [phone]

## 2014-12-26 NOTE — Discharge Summary (Signed)
Physician Discharge Summary   Cardiologist:  Hochrein Patient ID: [name] MRN: [id number] DOB/AGE: 1950/11/04 64 y.o.  Admit date: 12/24/2014 Discharge date: 12/26/2014  Admission Diagnoses:  Acute on chronic diastolic heart failure  Discharge Diagnoses:  Active Problems:   CHF (congestive heart failure) (HCC)  essential hypertension Chronic kidney disease stage III CAD Ongoing tobacco abuse Hyperkalemia   Discharged Condition: stable  Hospital Course:   [name] is a 64 y.o. female with a history of CAD w/ known occluded RCA, ongoing tobacco abuse, DM, COPD, HLD, HTN, chronic diastolic CHF, CKD, renal artery stenosis, GI AVM s/p ablation, carotid artery disease s/p CEA, PVD s/p left common iliac and external iliac artery stents as well as scabies who presented to [facility] today with shortness of breath and CP.   She had a NSTEMI in the setting of anemia in 2007. She underwent LHC which revealed high-grade single-vessel coronary artery disease with 50% left main stenosis, ostial 60% LAD stenosis, 30% ramus intermediate stenosis, occluded right artery with left-to-right collaterals normal LVEF. She was felt to not be a good candidate forpercutaneous revascularization because of the location of the LAD and it did not appear to be obstructive. The symptoms with her evated troponin were felt most likley related to her anemia and occluded right coronary. She was continued on medical management.   She was last seen by Dr. Percival Spanish in 04/2013. She has not been able to follow up with his since she lost her insurance. She was in her usual state of health until a couple of days ago when she noticed swelling in her legs. She also had shortness of breath and orthopnea. She has been sleeping in her recliner. She denies PND. She also has had intermittent sharp chest pain that comes and goes. It seems to be worse with exertion. No associated worsened SOB, diaphoresis, nausea or radiation. She is  currently chest pain free. She also notes some DOE. She does not do much activity but does walk to the neighbors house from time to time and gets a little SOB and notices some chest pain that is relieved with rest.   She was recently diagnosed with scabies and treated with permetherin cream. She continues to smoke about a half a pack per day.   Patient was admitted with acute on chronic diastolic heart failure. She had a 2-D echocardiogram September of this year with an ejection fraction of 55-60% mild LVH and no regional wall motion abnormalities. PA pressure was 57 mmHg. This admission her BNP was elevated at 1288. Troponin was negative. Chest x-ray showed pulmonary edema. She was started on IV Lasix 80 mg twice daily with potassium supplementation.  Ultimately diuresed 4 L.  The morning of her discharge patient had elevated potassium at 6.5. She was given Kayexalate 30 mg. Repeat potassium was 4.9.  We'll resume Lasix 40 mg twice a day. Serum creatinine was elevated. Recommend checking a basic metabolic panel at follow-up.The patient was seen by Dr. Marlou Porch who felt she was stable for DC home.   Consults: Diabetes coordinator  Significant Diagnostic Studies:  CHEST 2 VIEW  COMPARISON: 11/07/2014  FINDINGS: Cardiomegaly again noted. Mild interstitial prominence again noted no convincing pulmonary edema. Streaky bilateral basilar atelectasis or infiltrate. Thoracic spine osteopenia.  IMPRESSION: Mild interstitial prominence bilateral without convincing pulmonary edema. Streaky bilateral basilar atelectasis on infiltrate. Treatments:See above   Discharge Exam: Blood pressure 153/56, pulse 51, temperature 98.8 F (37.1 C), temperature source Oral, resp. rate 18, height  4\' 10"  (1.473 m), weight 136 lb 6.4 oz (61.871 kg), SpO2 93 %.   Disposition: 01-Home or Self Care      Discharge Instructions    Diet - low sodium heart healthy    Complete by:  As directed      Discharge  instructions    Complete by:  As directed   Monitor your weight every morning.  If you gain 3 pounds in 24 hours, or 5 pounds in a week, call the office for instructions.     Increase activity slowly    Complete by:  As directed             Medication List    STOP taking these medications        levofloxacin 500 MG tablet  Commonly known as:  LEVAQUIN     permethrin 5 % cream  Commonly known as:  ELIMITE      TAKE these medications        acetaminophen 500 MG tablet  Commonly known as:  TYLENOL  Take 500 mg by mouth every 6 (six) hours as needed (pain).     amLODipine 10 MG tablet  Commonly known as:  NORVASC  Take 10 mg by mouth daily.     aspirin EC 81 MG tablet  Take 81 mg by mouth daily.     cloNIDine 0.1 MG tablet  Commonly known as:  CATAPRES  Take 0.1 mg by mouth 2 (two) times daily.     fish oil-omega-3 fatty acids 1000 MG capsule  Take 1 g by mouth 3 (three) times daily. Three times daily     furosemide 80 MG tablet  Commonly known as:  LASIX  Take 0.5 tablets (40 mg total) by mouth 2 (two) times daily.     hydrocortisone 2.5 % lotion  Apply topically 2 (two) times daily.     Iron 325 (65 FE) MG Tabs  Take 325 mg by mouth 3 (three) times daily.     losartan 50 MG tablet  Commonly known as:  COZAAR  Take 1 tablet (50 mg total) by mouth daily.     metoprolol 50 MG tablet  Commonly known as:  LOPRESSOR  Take 50 mg by mouth 2 (two) times daily.     pantoprazole 40 MG tablet  Commonly known as:  PROTONIX  Take 1 tablet (40 mg total) by mouth daily.     predniSONE 50 MG tablet  Commonly known as:  DELTASONE  Take 1 tablet (50 mg total) by mouth daily with breakfast.     TUMS PO  Take 1 tablet by mouth daily as needed (acid reflux/heartburn).     ZOLOFT 100 MG tablet  Generic drug:  sertraline  Take 200 mg by mouth daily.       Follow-up Information    Follow up with [facility] On 01/08/2015.   Why:  Hospital  Follow Up @ 1130 am with NP Cammie Sickle.    Contact information:   Address: [address] [place], [place] [address] Phone: [phone]      Follow up with Charlie Pitter, PA-C On 01/02/2015.   Specialties:  Cardiology, Radiology   Why:  10:00 AM   Contact information:   [address] [place] [address] [phone]      Greater than 30 minutes was spent completing the patient's discharge.   SignedTarri Fuller, [place] 12/26/2014, 2:19 PM  Personally seen and  examined. Agree with above. Excellent diuresis.  Scabies scabbed over. HTN improved with diuresis CAD stable with known RCA dz Encouraged tobacco cessation.  Candee Furbish, MD

## 2014-12-26 NOTE — Telephone Encounter (Signed)
Patient contacted regarding discharge from [facility] on 12/26/2014.  Patient understands to follow up with provider Sharrell Ku, PA on 01/02/15 at 10:00am at [facility]. Patient understands discharge instructions? yes Patient understands medications and regiment? yes Patient understands to bring all medications to this visit? yes

## 2014-12-26 NOTE — Telephone Encounter (Signed)
TCM per [facility]  11/11 @ 10am w/ Lisbeth Renshaw

## 2015-01-02 ENCOUNTER — Ambulatory Visit (INDEPENDENT_AMBULATORY_CARE_PROVIDER_SITE_OTHER): Payer: Self-pay | Admitting: Cardiovascular Disease

## 2015-01-02 ENCOUNTER — Encounter: Payer: Self-pay | Admitting: Physician Assistant

## 2015-01-02 VITALS — BP 142/58 | HR 45 | Ht <= 58 in | Wt 136.0 lb

## 2015-01-02 DIAGNOSIS — R001 Bradycardia, unspecified: Secondary | ICD-10-CM

## 2015-01-02 DIAGNOSIS — I5032 Chronic diastolic (congestive) heart failure: Secondary | ICD-10-CM

## 2015-01-02 DIAGNOSIS — I739 Peripheral vascular disease, unspecified: Secondary | ICD-10-CM

## 2015-01-02 DIAGNOSIS — N184 Chronic kidney disease, stage 4 (severe): Secondary | ICD-10-CM | POA: Insufficient documentation

## 2015-01-02 DIAGNOSIS — I10 Essential (primary) hypertension: Secondary | ICD-10-CM

## 2015-01-02 DIAGNOSIS — E875 Hyperkalemia: Secondary | ICD-10-CM

## 2015-01-02 DIAGNOSIS — N183 Chronic kidney disease, stage 3 unspecified: Secondary | ICD-10-CM

## 2015-01-02 DIAGNOSIS — I110 Hypertensive heart disease with heart failure: Secondary | ICD-10-CM

## 2015-01-02 DIAGNOSIS — Z87891 Personal history of nicotine dependence: Secondary | ICD-10-CM | POA: Insufficient documentation

## 2015-01-02 DIAGNOSIS — I2510 Atherosclerotic heart disease of native coronary artery without angina pectoris: Secondary | ICD-10-CM

## 2015-01-02 DIAGNOSIS — I779 Disorder of arteries and arterioles, unspecified: Secondary | ICD-10-CM | POA: Insufficient documentation

## 2015-01-02 LAB — COMPREHENSIVE METABOLIC PANEL
ALT: 9 U/L (ref 6–29)
AST: 16 U/L (ref 10–35)
Albumin: 3.1 g/dL — ABNORMAL LOW (ref 3.6–5.1)
Alkaline Phosphatase: 90 U/L (ref 33–130)
BILIRUBIN TOTAL: 0.3 mg/dL (ref 0.2–1.2)
BUN: 33 mg/dL — AB (ref 7–25)
CALCIUM: 9.3 mg/dL (ref 8.6–10.4)
CO2: 23 mmol/L (ref 20–31)
CREATININE: 1.26 mg/dL — AB (ref 0.50–0.99)
Chloride: 104 mmol/L (ref 98–110)
GLUCOSE: 105 mg/dL — AB (ref 65–99)
Potassium: 4.7 mmol/L (ref 3.5–5.3)
SODIUM: 137 mmol/L (ref 135–146)
Total Protein: 6.1 g/dL (ref 6.1–8.1)

## 2015-01-02 NOTE — Progress Notes (Addendum)
Cardiology Office Note Date:  01/02/2015  Patient ID:  [name], [name] 1950-08-10, MRN [id number] PCP:  [name], MD  Cardiologist: Hochrein   Chief Complaint: f/u CHF  Seen for PA who was sick in clinic   History of Present Illness: [name] is a 64 y.o. female with history of CAD (NSTEMI 2007 in setting of anemia; LHC 50% LM, 60% oLAD, 30% RI, occluded RCA with L-R collaterals, normal EF, treated medically), chronic diastolic CHF, CKD stage III, tobacco abuse, PVD s/p left common iliac and external iliac artery stents as well as mesenteric/celiac/subclavian stenosis by duplexes below, renal artery stenosis, carotid disease s/p left carotid endarterectomy ~2000, bradycardia limiting BB titration, DM, COPD, HLD, HTN, GI AVM s/p cauterization, iron deficiency anemia, ?possible hx of TIA (in problem list by Dr. Percival Spanish) and scabies who presents for post-hospital follow-up. She had a NSTEMI in the setting of anemia in 2007. She underwent LHC which revealed high-grade single-vessel coronary artery disease with 50% left main stenosis, ostial 60% LAD stenosis, 30% ramus intermediate stenosis, occluded right artery with left-to-right collaterals normal LVEF. She was felt to not be a good candidate forpercutaneous revascularization because of the location of the LAD and it did not appear to be obstructive. The symptoms with her evated troponin were felt most likey related to her anemia and occluded right coronary. She was continued on medical management. She had not followed up in the office since 04/2014 due to losing her insurance. She was admitted 12/24/14 with CP, SOB, and acute on chronic diastolic CHF. Troponin was negative. She was treated with IV Lasix and ultimately diuresed 4L. On day of discharge she had a K of 6.5 and was treated with kayexalate with f/u K 4.9. D/c weight 136lb, d/c Cr 1.84 (most recent range earlier this year was 1.6-1.8). Hgb 10.1, c/w prior. 2D Echo 10/2014:  mild LVH, EF 0000000, +LV diastolic dysfunction, aortic sclerosis iwthout stenosis, mildly dilated LA/RA, mild TR, PASP 23mmHg.  Other studies: - Last carotid duplex 04/2013: stable 60-79% bilateral ICA stenosis, mild RECA stenosis, moderate right subclavian stenosis, mild left subclavian stenosis, patent vertebrals with antegrade flow, f/u recommended 6 mo.  - Renal duplex 2014: no obvious evidence of hemodynamically significant stenosis >60%, bilateral intrarenal arteries exhibit absent diastolic flow. There is evidence of elevated velocities of the mid aorta. 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   Past Medical History  Diagnosis Date  . Peripheral vascular disease ([place])     a.s /p left common iliac and external iliac artery stents. b. H/o subclavian, mesenteric, and celiac artery stenosis (see below).  . Diabetes mellitus   . COPD (chronic obstructive pulmonary disease) ([place])   . Hyperlipidemia   . Depression   . Chronic diastolic CHF (congestive heart failure) ([place])   . Essential hypertension   . Renal artery stenosis ([place])     a. Duplex 2014: no obvious evidence of hemodynamically significant stenosis >60%, bilateral intrarenal arteries exhibit absent diastolic flow. There is evidence of elevated velocities of the mid aorta. 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  [name] CAD (coronary artery disease)     a. NSTEMI 2007 in setting of anemia; LHC 50% LM, 60% oLAD, 30% RI, occluded RCA with L-R collaterals, normal EF, treated medically.  . Iron deficiency anemia   . Diabetic retinopathy ([place])   . 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 Upper GI  bleed from jejunum, AVM vs. Dieaulafoy's lesion 01/25/2012  . Carotid artery disease ([place])     a. Last carotid duplex 04/2013: stable 60-79% bilateral ICA stenosis, mild RECA stenosis, moderate right subclavian stenosis, mild left subclavian stenosis,  patent vertebrals with antegrade flow, f/u recommended 6 mo.  . Hypertensive heart disease with congestive heart failure ([place])   . CKD (chronic kidney disease), stage III   . Tobacco abuse   . Bradycardia     a. Limiting BB titration.    Past Surgical History  Procedure Laterality Date  . Carotid endarterectomy    . Cholecystectomy    . Appendectomy    . Tonsillectomy    . Cesarean section    . Shoulder arthroscopy    . Iliac artery stent      left  . Enteroscopy  01/26/2012    Procedure: ENTEROSCOPY;  Surgeon: Gatha Mayer, MD;  Location: [facility] ENDOSCOPY;  Service: Endoscopy;  Laterality: N/A;    Current Outpatient Prescriptions  Medication Sig Dispense Refill  . acetaminophen (TYLENOL) 500 MG tablet Take 500 mg by mouth every 6 (six) hours as needed (pain).     [name] amLODipine (NORVASC) 10 MG tablet Take 10 mg by mouth daily.     [name] aspirin EC 81 MG tablet Take 81 mg by mouth daily.    . Calcium Carbonate Antacid (TUMS PO) Take 1 tablet by mouth daily as needed (acid reflux/heartburn).    . cloNIDine (CATAPRES) 0.1 MG tablet Take 0.1 mg by mouth 2 (two) times daily.  11  . Ferrous Sulfate (IRON) 325 (65 FE) MG TABS Take 325 mg by mouth 3 (three) times daily.     . fish oil-omega-3 fatty acids 1000 MG capsule Take 1 g by mouth 3 (three) times daily. Three times daily    . furosemide (LASIX) 80 MG tablet Take 0.5 tablets (40 mg total) by mouth 2 (two) times daily. 60 tablet 5  . hydrocortisone 2.5 % lotion Apply topically 2 (two) times daily. 59 mL 2  . losartan (COZAAR) 50 MG tablet Take 1 tablet (50 mg total) by mouth daily.    . metoprolol (LOPRESSOR) 50 MG tablet Take 50 mg by mouth 2 (two) times daily.  6  . [DISCONTINUED] loratadine (CLARITIN) 10 MG tablet Take 10 mg by mouth daily.       No current facility-administered medications for this visit.    Allergies:   Review of patient's allergies indicates no known allergies.   Social History:  The patient  reports that she has  been smoking Cigarettes.  She has a 36 pack-year smoking history. She has never used smokeless tobacco. She reports that she does not drink alcohol or use illicit drugs.   Family History:  The patient's family history includes Angina in her father; Cancer in her brother and sister; Heart attack in her mother; Hypertension in her father and mother. There is no history of Stroke.    ROS:  Please see the history of present illness. Otherwise, review of systems is positive for abdominal pain.   All other systems are reviewed and otherwise negative.   PHYSICAL EXAM:   VS:  BP 142/58 mmHg  Pulse 45  Ht 4\' 10"  (1.473 m)  Wt 61.689 kg (136 lb)  BMI 28.43 kg/m2 BMI: Body mass index is 28.43 kg/(m^2). Well nourished, well developed, in no acute distress HEENT: normocephalic, atraumatic Neck: no JVD, carotid bruits or masses Cardiac:  normal S1, S2; RRR; SEM  murmurs, rubs,  or gallops Lungs:  clear to auscultation bilaterally, no wheezing, rhonchi or rales Abd: soft, nontender, no hepatomegaly, + BS epigastric bruit  MS: no deformity or atrophy Ext: no edema fiemoral bruits with decreased peripheral pulses  Skin: warm and dry, no rash Neuro:  moves all extremities spontaneously, no focal abnormalities noted, follows commands Psych: euthymic mood, full affect  EKG:  Done today shows sinus bradycardia 45bpm, left posterior fascicular block, nonspecific ST-T changes inferiorly, V4-V6 01/02/15  SR rate 45 LPFB nonspecific inferolateral ST changes   Recent Labs: 11/07/2014: ALT 11* 12/24/2014: B Natriuretic Peptide 1288.9*; Hemoglobin 10.1*; Platelets 266; TSH 2.015 12/26/2014: BUN 40*; Creatinine, Ser 1.84*; Potassium 4.9; Sodium 137  No results found for requested labs within last 365 days.   Estimated Creatinine Clearance: 24 mL/min (by C-G formula based on Cr of 1.84).   Wt Readings from Last 3 Encounters:  01/02/15 61.689 kg (136 lb)  12/26/14 61.871 kg (136 lb 6.4 oz)  12/11/14 64.411 kg  (142 lb)     Other studies reviewed: Additional studies/records reviewed today include: summarized above  ASSESSMENT AND PLAN:  1. Chronic diastolic CHF/hypertensive heart disease: euvolemic lungs clear continue current Rx CKD stage III  CR 1.8  Renal duplex with vascular f/u 2. Bruit:  Overdue for carotid duplex 04/2013 bilateral 60-79% ICA stenosis  3. Essential HTN Well controlled.  Continue current medications and low sodium Dash type diet.   4. CAD Stable with no angina and good activity level.  Continue medical Rx 5. PVD no rest pain discussed smoking cessation f/u PV and ABI's with LE arterial duplex for stent patency 6. Scabies:  Pest management has been to house once has 2 more visits encouraged her to get new bed  Disposition: F/u with  Vascular next available [place] f/u Dr Percival Spanish 6 months   Current medicines are reviewed at length with the patient today.  The patient did not have any concerns regarding medicines.  Rozann Lesches MD  01/02/2015 10:26 AM     [facility] [address] [name] [address] [phone] (office)  [phone] (fax)

## 2015-01-02 NOTE — Patient Instructions (Signed)
Medication Instructions:  Your physician recommends that you continue on your current medications as directed. Please refer to the Current Medication list given to you today.   Labwork: TODAY:  CMET  Testing/Procedures: Your physician has requested that you have a carotid duplex. This test is an ultrasound of the carotid arteries in your neck. It looks at blood flow through these arteries that supply the brain with blood. Allow one hour for this exam. There are no restrictions or special instructions.    Follow-Up: Your physician recommends that you schedule a follow-up appointment in: 3 MONTHS WITH DR. HOCHREIN   Any Other Special Instructions Will Be Listed Below (If Applicable).    If you need a refill on your cardiac medications before your next appointment, please call your pharmacy.

## 2015-01-05 ENCOUNTER — Ambulatory Visit (HOSPITAL_COMMUNITY)
Admission: RE | Admit: 2015-01-05 | Discharge: 2015-01-05 | Disposition: A | Discharge status: Home / Self Care | Payer: Medicaid Other | Source: Ambulatory Visit | Attending: Physician Assistant | Admitting: Physician Assistant

## 2015-01-05 DIAGNOSIS — E119 Type 2 diabetes mellitus without complications: Secondary | ICD-10-CM | POA: Insufficient documentation

## 2015-01-05 DIAGNOSIS — I6523 Occlusion and stenosis of bilateral carotid arteries: Secondary | ICD-10-CM | POA: Diagnosis not present

## 2015-01-05 DIAGNOSIS — N183 Chronic kidney disease, stage 3 (moderate): Secondary | ICD-10-CM | POA: Diagnosis not present

## 2015-01-05 DIAGNOSIS — I129 Hypertensive chronic kidney disease with stage 1 through stage 4 chronic kidney disease, or unspecified chronic kidney disease: Secondary | ICD-10-CM | POA: Insufficient documentation

## 2015-01-05 DIAGNOSIS — I739 Peripheral vascular disease, unspecified: Secondary | ICD-10-CM | POA: Insufficient documentation

## 2015-01-05 DIAGNOSIS — F17200 Nicotine dependence, unspecified, uncomplicated: Secondary | ICD-10-CM | POA: Diagnosis not present

## 2015-01-05 DIAGNOSIS — E785 Hyperlipidemia, unspecified: Secondary | ICD-10-CM | POA: Diagnosis not present

## 2015-01-06 ENCOUNTER — Encounter (HOSPITAL_COMMUNITY): Payer: Self-pay

## 2015-01-08 ENCOUNTER — Ambulatory Visit (INDEPENDENT_AMBULATORY_CARE_PROVIDER_SITE_OTHER): Payer: Self-pay | Admitting: Family Medicine

## 2015-01-08 VITALS — BP 182/68 | HR 48 | Temp 97.8°F | Resp 14 | Ht <= 58 in | Wt 142.0 lb

## 2015-01-08 DIAGNOSIS — B86 Scabies: Secondary | ICD-10-CM

## 2015-01-08 DIAGNOSIS — L282 Other prurigo: Secondary | ICD-10-CM

## 2015-01-08 DIAGNOSIS — R809 Proteinuria, unspecified: Secondary | ICD-10-CM

## 2015-01-08 DIAGNOSIS — I10 Essential (primary) hypertension: Secondary | ICD-10-CM

## 2015-01-08 DIAGNOSIS — J449 Chronic obstructive pulmonary disease, unspecified: Secondary | ICD-10-CM

## 2015-01-08 DIAGNOSIS — Z720 Tobacco use: Secondary | ICD-10-CM

## 2015-01-08 LAB — POCT URINALYSIS DIP (DEVICE)
Bilirubin Urine: NEGATIVE
Glucose, UA: NEGATIVE mg/dL
KETONES UR: NEGATIVE mg/dL
Leukocytes, UA: NEGATIVE
Nitrite: NEGATIVE
PH: 6.5 (ref 5.0–8.0)
Specific Gravity, Urine: 1.025 (ref 1.005–1.030)
UROBILINOGEN UA: 0.2 mg/dL (ref 0.0–1.0)

## 2015-01-08 LAB — BASIC METABOLIC PANEL
BUN: 30 mg/dL — AB (ref 7–25)
CALCIUM: 9.7 mg/dL (ref 8.6–10.4)
CO2: 22 mmol/L (ref 20–31)
CREATININE: 1.32 mg/dL — AB (ref 0.50–0.99)
Chloride: 107 mmol/L (ref 98–110)
GLUCOSE: 99 mg/dL (ref 65–99)
Potassium: 5.8 mmol/L — ABNORMAL HIGH (ref 3.5–5.3)
Sodium: 138 mmol/L (ref 135–146)

## 2015-01-08 MED ORDER — ALBUTEROL SULFATE HFA 108 (90 BASE) MCG/ACT IN AERS: 2.0000 | INHALATION_SPRAY | Freq: Four times a day (QID) | RESPIRATORY_TRACT | Status: DC | PRN

## 2015-01-08 MED ORDER — HYDROCORTISONE 2.5 % EX LOTN
TOPICAL_LOTION | Freq: Two times a day (BID) | CUTANEOUS | Status: DC
Start: 2015-01-08 — End: 2015-04-07

## 2015-01-08 MED ORDER — CLONIDINE HCL 0.1 MG PO TABS
0.2000 mg | ORAL_TABLET | Freq: Once | ORAL | Status: AC
  Administered 2015-01-08: 0.2 mg via ORAL

## 2015-01-08 MED ORDER — PERMETHRIN 5 % EX CREA: 1.0000 "application " | TOPICAL_CREAM | Freq: Once | CUTANEOUS | Status: DC

## 2015-01-08 NOTE — Progress Notes (Signed)
Subjective:    Patient ID: [name], female    DOB: 1950/03/25, 64 y.o.   MRN: [id number]  HPI [name], a 64 year old female with a history of hypertension, type II diabetes, COPD, CVD, and anemia presents for a post hospital follow up. Patient was hospitalized for chest pains and shortness of breath. She had a BNP of 1288 and pulmonary edema on admission. She was treated for CHF. She denies chest pains or shortness of breath at present.  Patient has a markedly elevated BP today.   She states that she has been taking anti-hypertensive medications consistently. She was evaluated by Nephrologist on 11/16, who added Clonidine to medication regimen. She has not started medication.  She does not follow a routine diet or exercise regimen. . She does not complain of any new cardic complaints.  She currently denies headache, dizziness, fatigue, chest pains, shortness of breath, nausea, vomiting, diarrhea, or constipation.  Past Medical History  Diagnosis Date  . Peripheral vascular disease ([place])     a.s /p left common iliac and external iliac artery stents. b. H/o subclavian, mesenteric, and celiac artery stenosis (see below).  . Diabetes mellitus   . COPD (chronic obstructive pulmonary disease) ([place])   . Hyperlipidemia   . Depression   . Chronic diastolic CHF (congestive heart failure) ([place])   . Essential hypertension   . Renal artery stenosis ([place])     a. Duplex 2014: no obvious evidence of hemodynamically significant stenosis >60%, bilateral intrarenal arteries exhibit absent diastolic flow. There is evidence of elevated velocities of the mid aorta. 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  [name] CAD (coronary artery disease)     a. NSTEMI 2007 in setting of anemia; LHC 50% LM, 60% oLAD, 30% RI, occluded RCA with L-R collaterals, normal EF, treated medically.  . Iron deficiency anemia   . Diabetic retinopathy ([place])   . GI AVM (gastrointestinal arteriovenous  vascular malformation)     small bowel  . 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 Carotid artery disease ([place])     a. Duplex 12/2014: left CEA patent with elevated velocities, 60-79% RICA (stable over serial exams), 123456 LICA stenosis (stable over serial exams), 50% LECA, elevated velocities in bilateral subclavian arteries.  . Hypertensive heart disease with congestive heart failure ([place])   . CKD (chronic kidney disease), stage III   . Tobacco abuse   . Bradycardia     a. Limiting BB titration.     Medication List       This list is accurate as of: 01/08/15 11:17 AM.  Always use your most recent med list.               acetaminophen 500 MG tablet  Commonly known as:  TYLENOL  Take 500 mg by mouth every 6 (six) hours as needed (pain).     amLODipine 10 MG tablet  Commonly known as:  NORVASC  Take 10 mg by mouth daily.     aspirin EC 81 MG tablet  Take 81 mg by mouth daily.     cloNIDine 0.1 MG tablet  Commonly known as:  CATAPRES  Take 0.1 mg by mouth 2 (two) times daily.     fish oil-omega-3 fatty acids 1000 MG capsule  Take 1 g by mouth 3 (three) times daily. Three times daily     furosemide 80 MG tablet  Commonly known as:  LASIX  Take  0.5 tablets (40 mg total) by mouth 2 (two) times daily.     hydrocortisone 2.5 % lotion  Apply topically 2 (two) times daily.     Iron 325 (65 FE) MG Tabs  Take 325 mg by mouth 3 (three) times daily.     losartan 50 MG tablet  Commonly known as:  COZAAR  Take 1 tablet (50 mg total) by mouth daily.     metoprolol 50 MG tablet  Commonly known as:  LOPRESSOR  Take 50 mg by mouth 2 (two) times daily.     TUMS PO  Take 1 tablet by mouth daily as needed (acid reflux/heartburn).       Social History   Social History  . Marital Status: Married    Spouse Name: N/A  . Number of Children: N/A  . Years of Education: N/A   Occupational History  . Not on file.   Social History Main Topics  .  Smoking status: Current Every Day Smoker -- 1.00 packs/day for 36 years    Types: Cigarettes  . Smokeless tobacco: Never Used     Comment: uses e-cig  . Alcohol Use: No  . Drug Use: No  . Sexual Activity: No   Other Topics Concern  . Not on file   Social History Narrative  No Known Allergies  Immunization History  Administered Date(s) Administered  . Influenza Split 11/04/2011  . Influenza,inj,Quad PF,36+ Mos 11/10/2014  . Tdap 11/10/2014   Review of Systems  Constitutional: Negative.  Negative for fever, fatigue and unexpected weight change.  HENT: Negative.   Eyes: Negative.  Negative for visual disturbance.  Respiratory: Negative.  Negative for cough, chest tightness, shortness of breath (currently on home oxygen) and wheezing.   Cardiovascular: Negative.  Negative for chest pain, palpitations and leg swelling.  Gastrointestinal: Negative.   Endocrine: Negative.  Negative for polydipsia, polyphagia and polyuria.  Musculoskeletal: Negative.   Skin: Positive for rash.       Patient states that she has had scabies and bedbugs in the past. Generalized itching.   Allergic/Immunologic: Negative.   Neurological: Negative.  Negative for dizziness, weakness, light-headedness, numbness and headaches.  Hematological: Negative.   Psychiatric/Behavioral: Negative.         Objective:   Physical Exam  Constitutional: She is oriented to person, place, and time. She appears well-developed and well-nourished.  HENT:  Head: Normocephalic and atraumatic.  Right Ear: External ear normal.  Left Ear: External ear normal.  Mouth/Throat: Uvula is midline. Abnormal dentition (partially edentulous).  Eyes: Conjunctivae and EOM are normal. Pupils are equal, round, and reactive to light.  Neck: Normal range of motion. Neck supple.  Cardiovascular: Normal rate, regular rhythm, normal heart sounds and intact distal pulses.   Pulmonary/Chest: Effort normal and breath sounds normal.  Abdominal:  Soft. Bowel sounds are normal.  Musculoskeletal: Normal range of motion.  Neurological: She is alert and oriented to person, place, and time. She has normal reflexes.  Skin: Skin is warm, dry and intact. Rash noted. Rash is maculopapular (round, flat, erythematous, pruritic rash). No cyanosis. Nails show no clubbing.  Generalized rash, also located in webs of fingers.   Psychiatric: She has a normal mood and affect. Her behavior is normal. Judgment and thought content normal.      BP 182/68 mmHg  Pulse 48  Temp(Src) 97.8 F (36.6 C) (Oral)  Resp 14  Ht 4\' 10"  (1.473 m)  Wt 142 lb (64.411 kg)  BMI 29.69 kg/m2  SpO2 95%  Assessment & Plan:  1. Essential hypertension Blood pressure re-check is 168/62. Patient was started on Clonidine BID by Dr. Florene Glen, nephrologist on 01/07/2015. Patient has not started medication. Reminded Ms. Contessa to take medication consistently. Will check BMP to day, potassium, BUN, and creatinine were elevated during hospital stay. Patient denies dizziness, fatigue, chest pains, or palpitations. She was also evaluated by Dr. Johnsie Cancel, cardiologist on  11/14 for chronic diastolic hypertensive heart disease. Her lungs are clear at present.  - cloNIDine (CATAPRES) tablet 0.2 mg; Take 2 tablets (0.2 mg total) by mouth once. - Basic Metabolic Panel - POCT urinalysis dipstick  2. Tobacco abuse Encouraged Ms. Lillywhite to stop smoking due to chronic conditions, she states that she is trying. She has decreased to 3-4 cigarettes per day. Smoking cessation instruction/counseling given:  counseled patient on the dangers of tobacco use, advised patient to stop smoking, and reviewed strategies to maximize success  3. Scabies Patient states that she could not afford medications for scabies 1 month ago. I will resend medications to the pharmacy at [facility].  - permethrin (ACTICIN) 5 % cream; Apply 1 application topically once.  Dispense: 60 g; Refill: 0  4. Pruritic  rash - hydrocortisone 2.5 % lotion; Apply topically 2 (two) times daily.  Dispense: 59 mL; Refill: 2  5. Chronic obstructive pulmonary disease, unspecified COPD type ([place]) Patient is on 2 liters of oxygen at home. She states that she does not have a current rescue inhaler; I will prescribe today.  - albuterol (PROVENTIL HFA;VENTOLIN HFA) 108 (90 BASE) MCG/ACT inhaler; Inhale 2 puffs into the lungs every 6 (six) hours as needed for wheezing or shortness of breath.  Dispense: 1 Inhaler; Refill: 0 - Pulmonary Function Test; Future 1. Essential hypertension Blood pressure is above goal on current medication regimen. I will not increase blood pressure medications at this point. Patient has microalbuminemia and is under the care of Dr. Erling Cruz,  Nephrologist for chronic kidney failure and renal artery stenosis. Reviewed office note. Will continue medications as previously prescribed.  Will consult with Dr. Florene Glen prior to adjusting blood pressure medications. According to [facility], Ms. Balaguer has a follow up appointment scheduled for January 07, 2015.  The patient is asked to make an attempt to improve diet  to aid in medical management of this problem.  Patient was under the care of cardiologist, she was unable to follow up due to insurance constraints. Will fax labs to [facility].   - Basic Metabolic Panel - POCT urinalysis dipstick  2.Type 2 diabetes mellitus without complication, without long-term current use of insulin ([place]) Prediabetes Discussed previous lab results. Will not start medications at this time.   2. Crusted scabies Patient has a wide spread rash described as pruritis in webfolds of fingers and on lower extremities primarily. Discussed medication application at length. Patient and her daughter expressed understanding.  - permethrin (ELIMITE) 5 % cream; Apply 1 application topically once.  Dispense: 60 g; Refill: 0  3. Pruritic rash  -  hydrocortisone 2.5 % lotion; Apply topically 2 (two) times daily.  Dispense: 59 mL; Refill: 2   4. Chronic obstructive pulmonary disease, unspecified COPD type ([place]) Controlled since previous hospital visit. She continues to smoke 3-4 cigarettes per day. Patient encouraged to quit smoking. Patient is on home oxygen at 2 liters.   5. TOBACCO ABUSE  Smoking cessation instruction/counseling given:  counseled patient on the dangers of tobacco use, advised patient to stop  smoking, and reviewed strategies to maximize success   6. On home oxygen therapy Patient is currently on 2 liters of oxygen at home. To continue as previously ordered.   RTC: 3 months for hypertension  The patient was given clear instructions to go to ER or return to medical center if symptoms do not improve, worsen or new problems develop. The patient verbalized understanding. Will notify patient with laboratory results.   Dorena Dew, FNP

## 2015-01-09 ENCOUNTER — Telehealth: Payer: Self-pay | Admitting: Family Medicine

## 2015-01-09 DIAGNOSIS — E875 Hyperkalemia: Secondary | ICD-10-CM

## 2015-01-09 MED ORDER — SODIUM POLYSTYRENE SULFONATE 15 GM/60ML PO SUSP: 15.0000 g | Freq: Once | ORAL | Status: DC

## 2015-01-09 NOTE — Telephone Encounter (Signed)
Reviewed labs, potassium level is 5.8. Notified patient and her daughter. Will give a 1 time dose of kaexcelate. She will come to office on 01/13/2015 for labs.   The patient was given clear instructions to go to ER or return to medical center if symptoms develop, worsen or new problems develop. The patient verbalized understanding.  Meds ordered this encounter  Medications  . sodium polystyrene (KAYEXALATE) 15 GM/60ML suspension    Sig: Take 60 mLs (15 g total) by mouth once.    Dispense:  500 mL    Refill:  0    Hollis,Lachina M, FNP

## 2015-01-10 ENCOUNTER — Encounter: Payer: Self-pay | Admitting: Family Medicine

## 2015-01-10 DIAGNOSIS — R809 Proteinuria, unspecified: Secondary | ICD-10-CM | POA: Insufficient documentation

## 2015-01-10 DIAGNOSIS — B86 Scabies: Secondary | ICD-10-CM | POA: Insufficient documentation

## 2015-01-21 ENCOUNTER — Inpatient Hospital Stay (HOSPITAL_COMMUNITY)
Admission: EM | Admit: 2015-01-21 | Discharge: 2015-01-28 | DRG: 286 | Disposition: A | Discharge status: Home / Self Care | Payer: Medicaid Other | Attending: Internal Medicine | Admitting: Internal Medicine

## 2015-01-21 ENCOUNTER — Emergency Department (HOSPITAL_COMMUNITY): Payer: Medicaid Other

## 2015-01-21 ENCOUNTER — Encounter (HOSPITAL_COMMUNITY): Payer: Self-pay | Admitting: Emergency Medicine

## 2015-01-21 DIAGNOSIS — Z959 Presence of cardiac and vascular implant and graft, unspecified: Secondary | ICD-10-CM

## 2015-01-21 DIAGNOSIS — E875 Hyperkalemia: Secondary | ICD-10-CM | POA: Diagnosis not present

## 2015-01-21 DIAGNOSIS — I6521 Occlusion and stenosis of right carotid artery: Secondary | ICD-10-CM | POA: Diagnosis present

## 2015-01-21 DIAGNOSIS — T508X5A Adverse effect of diagnostic agents, initial encounter: Secondary | ICD-10-CM | POA: Diagnosis not present

## 2015-01-21 DIAGNOSIS — I2510 Atherosclerotic heart disease of native coronary artery without angina pectoris: Secondary | ICD-10-CM

## 2015-01-21 DIAGNOSIS — I25110 Atherosclerotic heart disease of native coronary artery with unstable angina pectoris: Principal | ICD-10-CM | POA: Diagnosis present

## 2015-01-21 DIAGNOSIS — F17210 Nicotine dependence, cigarettes, uncomplicated: Secondary | ICD-10-CM | POA: Diagnosis present

## 2015-01-21 DIAGNOSIS — I252 Old myocardial infarction: Secondary | ICD-10-CM

## 2015-01-21 DIAGNOSIS — I701 Atherosclerosis of renal artery: Secondary | ICD-10-CM | POA: Diagnosis present

## 2015-01-21 DIAGNOSIS — Z7982 Long term (current) use of aspirin: Secondary | ICD-10-CM

## 2015-01-21 DIAGNOSIS — F0390 Unspecified dementia without behavioral disturbance: Secondary | ICD-10-CM | POA: Diagnosis present

## 2015-01-21 DIAGNOSIS — D638 Anemia in other chronic diseases classified elsewhere: Secondary | ICD-10-CM | POA: Diagnosis present

## 2015-01-21 DIAGNOSIS — Z951 Presence of aortocoronary bypass graft: Secondary | ICD-10-CM

## 2015-01-21 DIAGNOSIS — I130 Hypertensive heart and chronic kidney disease with heart failure and stage 1 through stage 4 chronic kidney disease, or unspecified chronic kidney disease: Secondary | ICD-10-CM | POA: Diagnosis present

## 2015-01-21 DIAGNOSIS — B86 Scabies: Secondary | ICD-10-CM | POA: Diagnosis present

## 2015-01-21 DIAGNOSIS — Z8673 Personal history of transient ischemic attack (TIA), and cerebral infarction without residual deficits: Secondary | ICD-10-CM

## 2015-01-21 DIAGNOSIS — E46 Unspecified protein-calorie malnutrition: Secondary | ICD-10-CM | POA: Diagnosis present

## 2015-01-21 DIAGNOSIS — I5033 Acute on chronic diastolic (congestive) heart failure: Secondary | ICD-10-CM | POA: Diagnosis present

## 2015-01-21 DIAGNOSIS — N178 Other acute kidney failure: Secondary | ICD-10-CM | POA: Diagnosis not present

## 2015-01-21 DIAGNOSIS — J449 Chronic obstructive pulmonary disease, unspecified: Secondary | ICD-10-CM | POA: Diagnosis present

## 2015-01-21 DIAGNOSIS — E1122 Type 2 diabetes mellitus with diabetic chronic kidney disease: Secondary | ICD-10-CM | POA: Diagnosis present

## 2015-01-21 DIAGNOSIS — N179 Acute kidney failure, unspecified: Secondary | ICD-10-CM | POA: Insufficient documentation

## 2015-01-21 DIAGNOSIS — I2582 Chronic total occlusion of coronary artery: Secondary | ICD-10-CM | POA: Diagnosis present

## 2015-01-21 DIAGNOSIS — R001 Bradycardia, unspecified: Secondary | ICD-10-CM | POA: Diagnosis present

## 2015-01-21 DIAGNOSIS — N141 Nephropathy induced by other drugs, medicaments and biological substances: Secondary | ICD-10-CM | POA: Diagnosis not present

## 2015-01-21 DIAGNOSIS — N184 Chronic kidney disease, stage 4 (severe): Secondary | ICD-10-CM | POA: Diagnosis present

## 2015-01-21 DIAGNOSIS — E119 Type 2 diabetes mellitus without complications: Secondary | ICD-10-CM

## 2015-01-21 DIAGNOSIS — E785 Hyperlipidemia, unspecified: Secondary | ICD-10-CM | POA: Diagnosis present

## 2015-01-21 DIAGNOSIS — R0902 Hypoxemia: Secondary | ICD-10-CM | POA: Diagnosis present

## 2015-01-21 DIAGNOSIS — I160 Hypertensive urgency: Secondary | ICD-10-CM | POA: Diagnosis present

## 2015-01-21 DIAGNOSIS — I081 Rheumatic disorders of both mitral and tricuspid valves: Secondary | ICD-10-CM | POA: Diagnosis present

## 2015-01-21 DIAGNOSIS — Z6829 Body mass index (BMI) 29.0-29.9, adult: Secondary | ICD-10-CM

## 2015-01-21 DIAGNOSIS — R079 Chest pain, unspecified: Secondary | ICD-10-CM

## 2015-01-21 DIAGNOSIS — N183 Chronic kidney disease, stage 3 (moderate): Secondary | ICD-10-CM | POA: Diagnosis present

## 2015-01-21 DIAGNOSIS — E11319 Type 2 diabetes mellitus with unspecified diabetic retinopathy without macular edema: Secondary | ICD-10-CM | POA: Diagnosis present

## 2015-01-21 DIAGNOSIS — R9439 Abnormal result of other cardiovascular function study: Secondary | ICD-10-CM | POA: Insufficient documentation

## 2015-01-21 DIAGNOSIS — E1151 Type 2 diabetes mellitus with diabetic peripheral angiopathy without gangrene: Secondary | ICD-10-CM | POA: Diagnosis present

## 2015-01-21 DIAGNOSIS — E669 Obesity, unspecified: Secondary | ICD-10-CM | POA: Diagnosis present

## 2015-01-21 DIAGNOSIS — I5032 Chronic diastolic (congestive) heart failure: Secondary | ICD-10-CM | POA: Diagnosis present

## 2015-01-21 DIAGNOSIS — D509 Iron deficiency anemia, unspecified: Secondary | ICD-10-CM | POA: Diagnosis present

## 2015-01-21 DIAGNOSIS — Z8249 Family history of ischemic heart disease and other diseases of the circulatory system: Secondary | ICD-10-CM

## 2015-01-21 DIAGNOSIS — Z9981 Dependence on supplemental oxygen: Secondary | ICD-10-CM

## 2015-01-21 HISTORY — DX: Anemia in other chronic diseases classified elsewhere: D63.8

## 2015-01-21 HISTORY — DX: Unspecified protein-calorie malnutrition: E46

## 2015-01-21 HISTORY — DX: Unspecified dementia, unspecified severity, without behavioral disturbance, psychotic disturbance, mood disturbance, and anxiety: F03.90

## 2015-01-21 LAB — CBC
HEMATOCRIT: 32.7 % — AB (ref 36.0–46.0)
HEMOGLOBIN: 10.3 g/dL — AB (ref 12.0–15.0)
MCH: 28.9 pg (ref 26.0–34.0)
MCHC: 31.5 g/dL (ref 30.0–36.0)
MCV: 91.9 fL (ref 78.0–100.0)
Platelets: 319 10*3/uL (ref 150–400)
RBC: 3.56 MIL/uL — AB (ref 3.87–5.11)
RDW: 15.6 % — ABNORMAL HIGH (ref 11.5–15.5)
WBC: 8 10*3/uL (ref 4.0–10.5)

## 2015-01-21 LAB — BRAIN NATRIURETIC PEPTIDE: B NATRIURETIC PEPTIDE 5: 833.4 pg/mL — AB (ref 0.0–100.0)

## 2015-01-21 LAB — BASIC METABOLIC PANEL
Anion gap: 9 (ref 5–15)
BUN: 52 mg/dL — ABNORMAL HIGH (ref 6–20)
CHLORIDE: 106 mmol/L (ref 101–111)
CO2: 23 mmol/L (ref 22–32)
Calcium: 9.7 mg/dL (ref 8.9–10.3)
Creatinine, Ser: 1.61 mg/dL — ABNORMAL HIGH (ref 0.44–1.00)
GFR calc non Af Amer: 33 mL/min — ABNORMAL LOW (ref >60)
GFR, EST AFRICAN AMERICAN: 38 mL/min — AB (ref >60)
Glucose, Bld: 223 mg/dL — ABNORMAL HIGH (ref 65–99)
POTASSIUM: 4.1 mmol/L (ref 3.5–5.1)
SODIUM: 138 mmol/L (ref 135–145)

## 2015-01-21 LAB — I-STAT TROPONIN, ED: Troponin i, poc: 0.01 ng/mL (ref 0.00–0.08)

## 2015-01-21 MED ORDER — ONDANSETRON HCL 4 MG/2ML IJ SOLN: 4.0000 mg | Freq: Four times a day (QID) | INTRAMUSCULAR | Status: DC | PRN

## 2015-01-21 MED ORDER — ALBUTEROL SULFATE (2.5 MG/3ML) 0.083% IN NEBU
2.5000 mg | INHALATION_SOLUTION | Freq: Four times a day (QID) | RESPIRATORY_TRACT | Status: DC | PRN
  Administered 2015-01-27: 2.5 mg via RESPIRATORY_TRACT
  Filled 2015-01-21: qty 3

## 2015-01-21 MED ORDER — IRON 325 (65 FE) MG PO TABS: 325.0000 mg | ORAL_TABLET | Freq: Three times a day (TID) | ORAL | Status: DC

## 2015-01-21 MED ORDER — ALBUTEROL SULFATE HFA 108 (90 BASE) MCG/ACT IN AERS: 2.0000 | INHALATION_SPRAY | Freq: Four times a day (QID) | RESPIRATORY_TRACT | Status: DC | PRN

## 2015-01-21 MED ORDER — NITROGLYCERIN 0.4 MG SL SUBL
0.4000 mg | SUBLINGUAL_TABLET | SUBLINGUAL | Status: DC | PRN
  Administered 2015-01-21: 0.4 mg via SUBLINGUAL
  Filled 2015-01-21: qty 1

## 2015-01-21 MED ORDER — FUROSEMIDE 10 MG/ML IJ SOLN: 40.0000 mg | Freq: Two times a day (BID) | INTRAMUSCULAR | Status: DC

## 2015-01-21 MED ORDER — FERROUS SULFATE 325 (65 FE) MG PO TABS
325.0000 mg | ORAL_TABLET | Freq: Three times a day (TID) | ORAL | Status: DC
  Administered 2015-01-22 – 2015-01-24 (×8): 325 mg via ORAL
  Filled 2015-01-21 (×9): qty 1

## 2015-01-21 MED ORDER — HEPARIN SODIUM (PORCINE) 5000 UNIT/ML IJ SOLN
5000.0000 [IU] | Freq: Three times a day (TID) | INTRAMUSCULAR | Status: DC
  Administered 2015-01-22 – 2015-01-28 (×19): 5000 [IU] via SUBCUTANEOUS
  Filled 2015-01-21 (×16): qty 1

## 2015-01-21 MED ORDER — ASPIRIN EC 81 MG PO TBEC
81.0000 mg | DELAYED_RELEASE_TABLET | Freq: Every day | ORAL | Status: DC
  Administered 2015-01-22: 81 mg via ORAL
  Filled 2015-01-21: qty 1

## 2015-01-21 MED ORDER — CLONIDINE HCL 0.1 MG PO TABS
0.1000 mg | ORAL_TABLET | Freq: Two times a day (BID) | ORAL | Status: DC
  Administered 2015-01-22 – 2015-01-26 (×10): 0.1 mg via ORAL
  Filled 2015-01-21 (×10): qty 1

## 2015-01-21 MED ORDER — FUROSEMIDE 80 MG PO TABS
80.0000 mg | ORAL_TABLET | Freq: Two times a day (BID) | ORAL | Status: DC
  Administered 2015-01-22 (×2): 80 mg via ORAL
  Filled 2015-01-21 (×2): qty 1

## 2015-01-21 MED ORDER — LOSARTAN POTASSIUM 50 MG PO TABS
50.0000 mg | ORAL_TABLET | Freq: Every day | ORAL | Status: DC
  Administered 2015-01-22: 50 mg via ORAL
  Filled 2015-01-21: qty 1

## 2015-01-21 MED ORDER — ASPIRIN 81 MG PO CHEW
324.0000 mg | CHEWABLE_TABLET | Freq: Once | ORAL | Status: AC
  Administered 2015-01-21: 324 mg via ORAL
  Filled 2015-01-21: qty 4

## 2015-01-21 MED ORDER — AMLODIPINE BESYLATE 10 MG PO TABS
10.0000 mg | ORAL_TABLET | Freq: Every day | ORAL | Status: DC
  Administered 2015-01-22 – 2015-01-28 (×7): 10 mg via ORAL
  Filled 2015-01-21 (×7): qty 1

## 2015-01-21 MED ORDER — METOPROLOL TARTRATE 50 MG PO TABS
50.0000 mg | ORAL_TABLET | Freq: Two times a day (BID) | ORAL | Status: DC
  Administered 2015-01-22 – 2015-01-25 (×6): 50 mg via ORAL
  Filled 2015-01-21 (×3): qty 1
  Filled 2015-01-21: qty 2
  Filled 2015-01-21 (×6): qty 1

## 2015-01-21 MED ORDER — ACETAMINOPHEN 500 MG PO TABS: 500.0000 mg | ORAL_TABLET | Freq: Four times a day (QID) | ORAL | Status: DC | PRN

## 2015-01-21 NOTE — H&P (Signed)
[facility] History and Physical  [name] [id number] DOB: 04/13/1950 DOA: 01/21/2015  Referring physician: EDP PCP: [name], MD   Chief Complaint: Chest pain   HPI: [name] is a 64 y.o. 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  Patient presents to the ED with c/o chest pain.  Symptoms intermittent throughout the day today.  Associated SOB, nausea.  Pain initially 8/10 and BP initially 123XX123 systolic.  CP improves dramatically with NTG (and BP drops to 123456 systolic).  Admitted earlier this month for CHF exacerbation.  Also had CP at that time and cards notes that if this recurred they would likely get stress test as last stress is in 2013.  Review of Systems: Systems reviewed.  As above, otherwise negative  Past Medical History  Diagnosis Date  . Peripheral vascular disease ([place])     a.s /p left common iliac and external iliac artery stents. b. H/o subclavian, mesenteric, and celiac artery stenosis (see below).  . Diabetes mellitus   . COPD (chronic obstructive pulmonary disease) ([place])   . Hyperlipidemia   . Depression   . Chronic diastolic CHF (congestive heart failure) ([place])   . Essential hypertension   . Renal artery stenosis ([place])     a. Duplex 2014: no obvious evidence of hemodynamically significant stenosis >60%, bilateral intrarenal arteries exhibit absent diastolic flow. There is evidence of elevated velocities of the mid aorta. 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  [name] CAD (coronary artery disease)     a. NSTEMI 2007 in setting of anemia; LHC 50% LM, 60% oLAD, 30% RI, occluded RCA with L-R collaterals, normal EF, treated medically.  . Iron deficiency anemia   . Diabetic retinopathy ([place])   . 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 Dieaulafoy's lesion  01/25/2012  . Carotid artery disease ([place])     a. Duplex 12/2014: left CEA patent with elevated velocities, 60-79% RICA (stable over serial exams), 123456 LICA stenosis (stable over serial exams), 50% LECA, elevated velocities in bilateral subclavian arteries.  . Hypertensive heart disease with congestive heart failure ([place])   . CKD (chronic kidney disease), stage III   . Tobacco abuse   . Bradycardia     a. Limiting BB titration.   Past Surgical History  Procedure Laterality Date  . Carotid endarterectomy    . Cholecystectomy    . Appendectomy    . Tonsillectomy    . Cesarean section    . Shoulder arthroscopy    . Iliac artery stent      left  . Enteroscopy  01/26/2012    Procedure: ENTEROSCOPY;  Surgeon: Gatha Mayer, MD;  Location: [facility] ENDOSCOPY;  Service: Endoscopy;  Laterality: N/A;   Social History:  reports that she has been smoking Cigarettes.  She has a 36 pack-year smoking history. She has never used smokeless tobacco. She reports that she does not drink alcohol or use illicit drugs.  No Known Allergies  Family History  Problem Relation Age of Onset  . Hypertension Mother   . Heart attack Mother   . Hypertension Father   . Angina Father   . Cancer Sister   . Cancer Brother   . Stroke Neg Hx      Prior to Admission medications   Medication Sig Start Date End Date Taking? Authorizing Provider  acetaminophen (TYLENOL) 500 MG tablet Take 500 mg by mouth every 6 (six) hours as needed (pain).    Yes Historical Provider, MD  albuterol (PROVENTIL HFA;VENTOLIN HFA) 108 (90 BASE) MCG/ACT inhaler Inhale 2 puffs into the lungs every 6 (six) hours as needed for wheezing or shortness of breath. 01/08/15  Yes Dorena Dew, FNP  amLODipine (NORVASC) 10 MG tablet Take 10 mg by mouth daily.    Yes Historical Provider, MD  aspirin EC 81 MG tablet Take 81 mg by mouth daily.   Yes Historical Provider, MD  Calcium Carbonate Antacid (TUMS PO) Take 1 tablet by mouth daily as needed  (acid reflux/heartburn).   Yes Historical Provider, MD  cloNIDine (CATAPRES) 0.1 MG tablet Take 0.1 mg by mouth 2 (two) times daily. 11/14/14  Yes Historical Provider, MD  Ferrous Sulfate (IRON) 325 (65 FE) MG TABS Take 325 mg by mouth 3 (three) times daily.    Yes Historical Provider, MD  fish oil-omega-3 fatty acids 1000 MG capsule Take 1 g by mouth 3 (three) times daily. Three times daily   Yes Historical Provider, MD  furosemide (LASIX) 80 MG tablet Take 0.5 tablets (40 mg total) by mouth 2 (two) times daily. Patient taking differently: Take 80 mg by mouth 2 (two) times daily.  12/26/14  Yes Brett Canales, PA-C  hydrocortisone 2.5 % lotion Apply topically 2 (two) times daily. 01/08/15  Yes Dorena Dew, FNP  losartan (COZAAR) 50 MG tablet Take 1 tablet (50 mg total) by mouth daily. 11/09/14  Yes Velvet Bathe, MD  metoprolol (LOPRESSOR) 50 MG tablet Take 50 mg by mouth 2 (two) times daily. 10/08/14  Yes Historical Provider, MD  permethrin (ACTICIN) 5 % cream Apply 1 application topically once. 01/08/15  Yes Dorena Dew, FNP   Physical Exam: Filed Vitals:   01/21/15 2015 01/21/15 2039  BP: 163/58 168/67  Pulse: 53 65  Temp:    Resp: 13     BP 168/67 mmHg  Pulse 65  Temp(Src) 98.1 F (36.7 C)  Resp 13  Wt 62.37 kg (137 lb 8 oz)  SpO2 94%  General Appearance:    Alert, oriented, no distress, appears stated age  Head:    Normocephalic, atraumatic  Eyes:    PERRL, EOMI, sclera non-icteric        Nose:   Nares without drainage or epistaxis. Mucosa, turbinates normal  Throat:   Moist mucous membranes. Oropharynx without erythema or exudate.  Neck:   Supple. No carotid bruits.  No thyromegaly.  No lymphadenopathy.   Back:     No CVA tenderness, no spinal tenderness  Lungs:     Clear to auscultation bilaterally, without wheezes, rhonchi or rales  Chest wall:    No tenderness to palpitation  Heart:    Regular rate and rhythm without murmurs, gallops, rubs  Abdomen:     Soft,  non-tender, nondistended, normal bowel sounds, no organomegaly  Genitalia:    deferred  Rectal:    deferred  Extremities:   No clubbing, cyanosis or edema.  Pulses:   2+ and symmetric all extremities  Skin:   Skin color, texture, turgor normal, no rashes or lesions  Lymph nodes:   Cervical, supraclavicular, and axillary nodes normal  Neurologic:   CNII-XII intact. Normal strength, sensation and reflexes      throughout    Labs on Admission:  Basic Metabolic Panel:  Recent Labs Lab 01/21/15 1951  NA 138  K 4.1  CL 106  CO2 23  GLUCOSE 223*  BUN 52*  CREATININE 1.61*  CALCIUM 9.7   Liver Function Tests: No results for input(s): AST, ALT, ALKPHOS, BILITOT, PROT, ALBUMIN in the last 168 hours. No results for input(s): LIPASE, AMYLASE in the last 168 hours. No results for input(s): AMMONIA in the last 168 hours. CBC:  Recent Labs Lab 01/21/15 1951  WBC 8.0  HGB 10.3*  HCT 32.7*  MCV 91.9  PLT 319   Cardiac Enzymes: No results for input(s): CKTOTAL, CKMB, CKMBINDEX, TROPONINI in the last 168 hours.  BNP (last 3 results) No results for input(s): PROBNP in the last 8760 hours. CBG: No results for input(s): GLUCAP in the last 168 hours.  Radiological Exams on Admission: Dg Chest 2 View  01/21/2015  CLINICAL DATA:  Chest pain on the left side starting today. EXAM: CHEST  2 VIEW COMPARISON:  December 24, 2014 FINDINGS: The mediastinal contour is normal. The heart size is enlarged. There is diffuse increased pulmonary interstitium. There is no focal pneumonia or pleural effusion. The lungs are hyperinflated. No acute abnormalities identified within the visualized bones. There is scoliosis of spine. IMPRESSION: Mild interstitial edema. Electronically Signed   By: Abelardo Diesel M.D.   On: 01/21/2015 19:51    EKG: Independently reviewed.  Assessment/Plan Principal Problem:   Acute chest pain Active Problems:   Acute on chronic diastolic CHF (congestive heart failure)  ([place])   1. Acute chest pain - 1. Probably mostly due to hypertensive urgency, but would be a good idea given her history, and fact that this is recurrence to consider stress test as suggested in cards notes from earlier this month 2. Chest pain obs pathway 3. NPO after midnight 4. Resume home BP meds, add PRN if needed but BP seems improved with NTG 2. Slight worsening of chronic diastolic CHF today - likely due to BP, control patients BP as noted above, will continue home meds including lasix 80mg  PO BID for now. 3. DM - diet controlled    Code Status: Full  Family Communication: No family in room Disposition Plan: Admit to obs   Time spent: 50 min  GARDNER, JARED M. [facility] Pager [phone]  If 7AM-7PM, please contact the day team taking care of the patient [email or web address] Password [email or web address] 01/21/2015, 11:06 PM

## 2015-01-21 NOTE — ED Provider Notes (Signed)
64 year old female with known history of heart failure and coronary disease, presents with left-sided chest pain which is been intermittent the last couple of days as well as increased shortness of breath. She reports intermittent weight gain, has somewhat labile weight fluctuating by 5-10 pounds at times, she currently weighs more than usual. She reports having shortness of breath which is worse with laying down. On exam she does have slight rales at the bases, she is not tachypneic, she does have slight peripheral edema bilaterally.  Chest x-ray reveals that she does have bilateral pulmonary edema,  Results for orders placed or performed during the hospital encounter of 01/21/15  NM Myocar Multi W/Spect W/Wall Motion / EF  Result Value Ref Range   Rest HR 66 bpm   Rest BP 168/57 mmHg   Exercise duration (min) 0 min   Exercise duration (sec) 0 sec   Estimated workload 1.0 METS   Peak HR 105 bpm   Peak BP 147/52 mmHg   MPHR 156 bpm   Percent HR 67 %   RPE 0    LV Systolic Volume 44 mL   TID 123XX123    LV Diastolic Volume 93 mL   LHR 0.00    SSS 5    SRS 1    SDS 4   Basic metabolic panel  Result Value Ref Range   Sodium 138 135 - 145 mmol/L   Potassium 4.1 3.5 - 5.1 mmol/L   Chloride 106 101 - 111 mmol/L   CO2 23 22 - 32 mmol/L   Glucose, Bld 223 (H) 65 - 99 mg/dL   BUN 52 (H) 6 - 20 mg/dL   Creatinine, Ser 1.61 (H) 0.44 - 1.00 mg/dL   Calcium 9.7 8.9 - 10.3 mg/dL   GFR calc non Af Amer 33 (L) >60 mL/min   GFR calc Af Amer 38 (L) >60 mL/min   Anion gap 9 5 - 15  CBC  Result Value Ref Range   WBC 8.0 4.0 - 10.5 K/uL   RBC 3.56 (L) 3.87 - 5.11 MIL/uL   Hemoglobin 10.3 (L) 12.0 - 15.0 g/dL   HCT 32.7 (L) 36.0 - 46.0 %   MCV 91.9 78.0 - 100.0 fL   MCH 28.9 26.0 - 34.0 pg   MCHC 31.5 30.0 - 36.0 g/dL   RDW 15.6 (H) 11.5 - 15.5 %   Platelets 319 150 - 400 K/uL  Brain natriuretic peptide  Result Value Ref Range   B Natriuretic Peptide 833.4 (H) 0.0 - 100.0 pg/mL  Troponin I  (q 6hr x 3)  Result Value Ref Range   Troponin I <0.03 <0.031 ng/mL  Troponin I (q 6hr x 3)  Result Value Ref Range   Troponin I <0.03 <0.031 ng/mL  Troponin I (q 6hr x 3)  Result Value Ref Range   Troponin I <0.03 <0.031 ng/mL  Protime-INR  Result Value Ref Range   Prothrombin Time 14.0 11.6 - 15.2 seconds   INR 1.05 0.00 - 1.49  Glucose, capillary  Result Value Ref Range   Glucose-Capillary 148 (H) 65 - 99 mg/dL   Comment 1 Notify RN   Basic metabolic panel  Result Value Ref Range   Sodium 141 135 - 145 mmol/L   Potassium 3.7 3.5 - 5.1 mmol/L   Chloride 109 101 - 111 mmol/L   CO2 23 22 - 32 mmol/L   Glucose, Bld 107 (H) 65 - 99 mg/dL   BUN 42 (H) 6 - 20 mg/dL  Creatinine, Ser 1.39 (H) 0.44 - 1.00 mg/dL   Calcium 8.7 (L) 8.9 - 10.3 mg/dL   GFR calc non Af Amer 39 (L) >60 mL/min   GFR calc Af Amer 45 (L) >60 mL/min   Anion gap 9 5 - 15  Glucose, capillary  Result Value Ref Range   Glucose-Capillary 124 (H) 65 - 99 mg/dL   Comment 1 Notify RN   Protime-INR  Result Value Ref Range   Prothrombin Time 14.3 11.6 - 15.2 seconds   INR 1.09 0.00 - 1.49  CBC  Result Value Ref Range   WBC 7.1 4.0 - 10.5 K/uL   RBC 3.55 (L) 3.87 - 5.11 MIL/uL   Hemoglobin 10.3 (L) 12.0 - 15.0 g/dL   HCT 32.5 (L) 36.0 - 46.0 %   MCV 91.5 78.0 - 100.0 fL   MCH 29.0 26.0 - 34.0 pg   MCHC 31.7 30.0 - 36.0 g/dL   RDW 15.5 11.5 - 15.5 %   Platelets 288 150 - 400 K/uL  Glucose, capillary  Result Value Ref Range   Glucose-Capillary 136 (H) 65 - 99 mg/dL   Comment 1 Notify RN    Comment 2 Document in Chart   Glucose, capillary  Result Value Ref Range   Glucose-Capillary 103 (H) 65 - 99 mg/dL   Comment 1 Notify RN    Comment 2 Document in Chart   Glucose, capillary  Result Value Ref Range   Glucose-Capillary 124 (H) 65 - 99 mg/dL  I-stat troponin, ED (not at [facility], [facility])  Result Value Ref Range   Troponin i, poc 0.01 0.00 - 0.08 ng/mL   Comment 3           I have personally viewed and  interpreted the imaging and agree with radiologist interpretation.    EKG Interpretation  Date/Time:  Wednesday January 21 2015 19:21:50 EST Ventricular Rate:  59 PR Interval:  102 QRS Duration: 88 QT Interval:  430 QTC Calculation: 425 R Axis:   118 Text Interpretation:  Sinus bradycardia with short PR Left posterior fascicular block ST \\T \ T wave abnormality, consider inferior ischemia Abnormal ECG Since last tracing rate faster T wave and ST abnormalities in the inferior leads more significant than on prior Confirmed by Asiyah Pineau  MD, Lorilynn Lehr ([id number]) on 01/21/2015 7:36:41 PM        Noemi Chapel, MD 01/23/15 1324

## 2015-01-21 NOTE — ED Notes (Signed)
Family at bedside. 

## 2015-01-21 NOTE — ED Provider Notes (Signed)
CSN: [id number]     Arrival date & time 01/21/15  1913 History   First MD Initiated Contact with Patient 01/21/15 1934     Chief Complaint  Patient presents with  . Chest Pain     (Consider location/radiation/quality/duration/timing/severity/associated sxs/prior Treatment) HPI  Patient is a 64 year old female with past medical history significant for CAD, peripheral artery disease, diastolic CHF, COPD, renal artery stenosis, who presents to the emergency department with chest pain. Patient reports left-sided chest pain, pressure sensation, intermittent over the past couple of days. Nothing has worsened or improved the chest pain. Has not taken anything for the pain. Associated with progressively worsening shortness of breath. Denies nausea, vomiting, diaphoresis, syncope. States shortness of breath is worse when lying down. 4 pillow PND, states lately having to sit in recliner at night. Denies fevers, chill, cough, congestion.   Past Medical History  Diagnosis Date  . Peripheral vascular disease ([place])     a.s /p left common iliac and external iliac artery stents. b. H/o subclavian, mesenteric, and celiac artery stenosis (see below).  . Diabetes mellitus   . COPD (chronic obstructive pulmonary disease) ([place])   . Hyperlipidemia   . Depression   . Chronic diastolic CHF (congestive heart failure) ([place])   . Essential hypertension   . Renal artery stenosis ([place])     a. Duplex 2014: no obvious evidence of hemodynamically significant stenosis >60%, bilateral intrarenal arteries exhibit absent diastolic flow. There is evidence of elevated velocities of the mid aorta. 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  [name] CAD (coronary artery disease)     a. NSTEMI 2007 in setting of anemia; LHC 50% LM, 60% oLAD, 30% RI, occluded RCA with L-R collaterals, normal EF, treated medically.  . Iron deficiency anemia   . Diabetic retinopathy ([place])   . GI AVM (gastrointestinal  arteriovenous vascular malformation)     small bowel  . 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 Carotid artery disease ([place])     a. Duplex 12/2014: left CEA patent with elevated velocities, 60-79% RICA (stable over serial exams), 123456 LICA stenosis (stable over serial exams), 50% LECA, elevated velocities in bilateral subclavian arteries.  . Hypertensive heart disease with congestive heart failure ([place])   . CKD (chronic kidney disease), stage III   . Tobacco abuse   . Bradycardia     a. Limiting BB titration.   Past Surgical History  Procedure Laterality Date  . Carotid endarterectomy    . Cholecystectomy    . Appendectomy    . Tonsillectomy    . Cesarean section    . Shoulder arthroscopy    . Iliac artery stent      left  . Enteroscopy  01/26/2012    Procedure: ENTEROSCOPY;  Surgeon: Gatha Mayer, MD;  Location: [facility] ENDOSCOPY;  Service: Endoscopy;  Laterality: N/A;   Family History  Problem Relation Age of Onset  . Hypertension Mother   . Heart attack Mother   . Hypertension Father   . Angina Father   . Cancer Sister   . Cancer Brother   . Stroke Neg Hx    Social History  Substance Use Topics  . Smoking status: Current Every Day Smoker -- 1.00 packs/day for 36 years    Types: Cigarettes  . Smokeless tobacco: Never Used     Comment: uses e-cig  . Alcohol Use: No   OB History    No data available  Review of Systems  Constitutional: Negative for fever, chills and appetite change.  HENT: Negative for congestion.   Respiratory: Positive for shortness of breath. Negative for cough, chest tightness and wheezing.   Cardiovascular: Positive for chest pain and leg swelling.  Gastrointestinal: Negative for vomiting, abdominal pain and diarrhea.  Genitourinary: Negative for dysuria and hematuria.  Musculoskeletal: Negative for back pain and neck pain.  Skin: Negative for rash.  Neurological: Negative for dizziness, seizures, facial  asymmetry, weakness and light-headedness.  Psychiatric/Behavioral: Negative for behavioral problems.      Allergies  Review of patient's allergies indicates no known allergies.  Home Medications   Prior to Admission medications   Medication Sig Start Date End Date Taking? Authorizing Provider  acetaminophen (TYLENOL) 500 MG tablet Take 500 mg by mouth every 6 (six) hours as needed (pain).    Yes Historical Provider, MD  albuterol (PROVENTIL HFA;VENTOLIN HFA) 108 (90 BASE) MCG/ACT inhaler Inhale 2 puffs into the lungs every 6 (six) hours as needed for wheezing or shortness of breath. 01/08/15  Yes Dorena Dew, FNP  amLODipine (NORVASC) 10 MG tablet Take 10 mg by mouth daily.    Yes Historical Provider, MD  aspirin EC 81 MG tablet Take 81 mg by mouth daily.   Yes Historical Provider, MD  Calcium Carbonate Antacid (TUMS PO) Take 1 tablet by mouth daily as needed (acid reflux/heartburn).   Yes Historical Provider, MD  cloNIDine (CATAPRES) 0.1 MG tablet Take 0.1 mg by mouth 2 (two) times daily. 11/14/14  Yes Historical Provider, MD  Ferrous Sulfate (IRON) 325 (65 FE) MG TABS Take 325 mg by mouth 3 (three) times daily.    Yes Historical Provider, MD  fish oil-omega-3 fatty acids 1000 MG capsule Take 1 g by mouth 3 (three) times daily. Three times daily   Yes Historical Provider, MD  furosemide (LASIX) 80 MG tablet Take 0.5 tablets (40 mg total) by mouth 2 (two) times daily. Patient taking differently: Take 80 mg by mouth 2 (two) times daily.  12/26/14  Yes Brett Canales, PA-C  hydrocortisone 2.5 % lotion Apply topically 2 (two) times daily. 01/08/15  Yes Dorena Dew, FNP  losartan (COZAAR) 50 MG tablet Take 1 tablet (50 mg total) by mouth daily. 11/09/14  Yes Velvet Bathe, MD  metoprolol (LOPRESSOR) 50 MG tablet Take 50 mg by mouth 2 (two) times daily. 10/08/14  Yes Historical Provider, MD  permethrin (ACTICIN) 5 % cream Apply 1 application topically once. 01/08/15  Yes Dorena Dew,  FNP   BP 166/40 mmHg  Pulse 52  Temp(Src) 98.4 F (36.9 C) (Oral)  Resp 18  Ht 4\' 10"  (1.473 m)  Wt 61.6 kg  BMI 28.39 kg/m2  SpO2 90% Physical Exam  Constitutional: She is oriented to person, place, and time. She appears well-developed and well-nourished.  HENT:  Head: Normocephalic and atraumatic.  Mouth/Throat: Oropharynx is clear and moist.  Eyes: Conjunctivae and EOM are normal. Pupils are equal, round, and reactive to light.  Neck: Normal range of motion. JVD present. No tracheal deviation present.  Cardiovascular: Normal rate, regular rhythm and intact distal pulses.   Murmur (systolic) heard. Pulmonary/Chest: She is in respiratory distress. She has no wheezes.  Crackles in lower lung fields  Abdominal: Soft. She exhibits no distension. There is no tenderness. There is no rebound and no guarding.  Musculoskeletal: Normal range of motion. Edema: +2 pitting edema, equal bilaterally.  Neurological: She is alert and oriented to person, place, and time.  Skin: Skin is warm. No pallor.  Psychiatric: She has a normal mood and affect.  Nursing note and vitals reviewed.   ED Course  Procedures (including critical care time) Labs Review Labs Reviewed  BASIC METABOLIC PANEL - Abnormal; Notable for the following:    Glucose, Bld 223 (*)    BUN 52 (*)    Creatinine, Ser 1.61 (*)    GFR calc non Af Amer 33 (*)    GFR calc Af Amer 38 (*)    All other components within normal limits  CBC - Abnormal; Notable for the following:    RBC 3.56 (*)    Hemoglobin 10.3 (*)    HCT 32.7 (*)    RDW 15.6 (*)    All other components within normal limits  BRAIN NATRIURETIC PEPTIDE - Abnormal; Notable for the following:    B Natriuretic Peptide 833.4 (*)    All other components within normal limits  GLUCOSE, CAPILLARY - Abnormal; Notable for the following:    Glucose-Capillary 148 (*)    All other components within normal limits  GLUCOSE, CAPILLARY - Abnormal; Notable for the following:     Glucose-Capillary 124 (*)    All other components within normal limits  GLUCOSE, CAPILLARY - Abnormal; Notable for the following:    Glucose-Capillary 136 (*)    All other components within normal limits  TROPONIN I  TROPONIN I  TROPONIN I  PROTIME-INR  BASIC METABOLIC PANEL  PROTIME-INR  CBC  I-STAT TROPOININ, ED    Imaging Review Dg Chest 2 View  01/21/2015  CLINICAL DATA:  Chest pain on the left side starting today. EXAM: CHEST  2 VIEW COMPARISON:  December 24, 2014 FINDINGS: The mediastinal contour is normal. The heart size is enlarged. There is diffuse increased pulmonary interstitium. There is no focal pneumonia or pleural effusion. The lungs are hyperinflated. No acute abnormalities identified within the visualized bones. There is scoliosis of spine. IMPRESSION: Mild interstitial edema. Electronically Signed   By: Abelardo Diesel M.D.   On: 01/21/2015 19:51   Nm Myocar Multi W/spect W/wall Motion / Ef  01/22/2015   There was no ST segment deviation noted during stress.  No T wave inversion was noted during stress.  Defect 1: There is a medium defect of moderate severity.  Findings consistent with ischemia.  Nuclear stress EF: 52%.  Moderate size and intensity, reversible inferior wall perfusion defect suggestive of ischemia (SDS 4). LVEF 52% with inferior hypokinesis. This is an intermediate risk study.   I have personally reviewed and evaluated these images and lab results as part of my medical decision-making.   EKG Interpretation   Date/Time:  Wednesday January 21 2015 19:21:50 EST Ventricular Rate:  59 PR Interval:  102 QRS Duration: 88 QT Interval:  430 QTC Calculation: 425 R Axis:   118 Text Interpretation:  Sinus bradycardia with short PR Left posterior  fascicular block ST \\T \ T wave abnormality, consider inferior ischemia  Abnormal ECG Since last tracing rate faster T wave and ST abnormalities in  the inferior leads more significant than on prior Confirmed  by MILLER  MD,  BRIAN ([id number]) on 01/21/2015 7:36:41 PM      MDM   Final diagnoses:  Chest pain at rest   Patient is a 64 year old female with past medical history significant for CAD, peripheral artery disease, diastolic CHF, COPD, renal artery stenosis, who presents to the emergency department with chest pain associated with shortness of breath. On arrival, in obvious distress. Afebrile,  hypertensive BP 170s/70s, HR 50s, SpO2 92% on RA. Exam as above, notable for lower lung fields with crackles, appears volume overloaded, intact distal pulses, systolic murmur.   Given ASA 325 mg and nitroglycerin.   HEART score high risk. Initial troponin 0.01. EKG showed sinus bradycardia, short PR interval, no chamber enlargement, ST abnormalities more pronounced when compared to prior EKG in inferior leads. CXR showed pulmonary edema. BNP 800s. Other labs unremarkable.   Given the patient's clinical picture doubt pulmonary embolism, low risk well's criteria, more likely CHF exacerbation based on volume status and CXR findings. Doubt dissection, no radiation to back, intact pulses, normal mediastinum on CXR.   On re-evaluation, patient with significant improvement of chest pain with nitro. Given that the last stress test was in 2013, will admit the patient to medicine for ACS r/o likely inpatient stress test. Patient stable for the floor. Transferred to the floor in stable condition.        Nathaniel Man, MD 01/23/15 [id number]  Noemi Chapel, MD 01/23/15 1324

## 2015-01-21 NOTE — Progress Notes (Signed)
PATIENT ARRIVED TO UNIT 3E FROM E.D. AMBULATED FROM STRETCHER TO BED WITH STANDBY ASSISTANCE. TELE APPLIED. VITALS OBTAINED. ASSESSMENT COMPLETED.  PATIENT ORIENTED TO UNIT AND EQUIPMENT. INSTRUCTED TO CALL FOR ASSISTANCE WHEN NEEDED.

## 2015-01-21 NOTE — ED Notes (Signed)
Pt states shes has off and on chest pain starting today, feels a little short of breath, states she feels naseous. Pt AAOX4, in NAD. 8/10 CP. Hx of CHF.

## 2015-01-22 ENCOUNTER — Encounter (HOSPITAL_COMMUNITY): Payer: Self-pay | Admitting: Cardiology

## 2015-01-22 ENCOUNTER — Encounter (HOSPITAL_COMMUNITY): Payer: Medicaid Other

## 2015-01-22 ENCOUNTER — Encounter (HOSPITAL_COMMUNITY)
Admission: EM | Disposition: A | Discharge status: Home / Self Care | Payer: Self-pay | Source: Home / Self Care | Attending: Internal Medicine

## 2015-01-22 DIAGNOSIS — R079 Chest pain, unspecified: Secondary | ICD-10-CM

## 2015-01-22 DIAGNOSIS — I5032 Chronic diastolic (congestive) heart failure: Secondary | ICD-10-CM

## 2015-01-22 LAB — NM MYOCAR MULTI W/SPECT W/WALL MOTION / EF
CHL CUP NUCLEAR SSS: 5
CSEPHR: 67 %
CSEPPHR: 105 {beats}/min
Estimated workload: 1 METS
Exercise duration (min): 0 min
Exercise duration (sec): 0 s
LHR: 0
LV dias vol: 93 mL
LVSYSVOL: 44 mL
MPHR: 156 {beats}/min
RPE: 0
Rest HR: 66 {beats}/min
SDS: 4
SRS: 1
TID: 1.68

## 2015-01-22 LAB — TROPONIN I

## 2015-01-22 LAB — PROTIME-INR
INR: 1.05 (ref 0.00–1.49)
PROTHROMBIN TIME: 14 s (ref 11.6–15.2)

## 2015-01-22 LAB — GLUCOSE, CAPILLARY
GLUCOSE-CAPILLARY: 148 mg/dL — AB (ref 65–99)
GLUCOSE-CAPILLARY: 136 mg/dL — AB (ref 65–99)
Glucose-Capillary: 124 mg/dL — ABNORMAL HIGH (ref 65–99)

## 2015-01-22 SURGERY — LEFT HEART CATH AND CORONARY ANGIOGRAPHY
Anesthesia: LOCAL

## 2015-01-22 MED ORDER — SODIUM CHLORIDE 0.9 % WEIGHT BASED INFUSION
1.0000 mL/kg/h | INTRAVENOUS | Status: DC
  Administered 2015-01-22 – 2015-01-23 (×2): 1 mL/kg/h via INTRAVENOUS

## 2015-01-22 MED ORDER — ASPIRIN 81 MG PO CHEW
81.0000 mg | CHEWABLE_TABLET | ORAL | Status: AC
  Administered 2015-01-23: 81 mg via ORAL
  Filled 2015-01-22 (×2): qty 1

## 2015-01-22 MED ORDER — SODIUM CHLORIDE 0.9 % IJ SOLN
80.0000 mg | INTRAVENOUS | Status: AC
  Administered 2015-01-22: 80 mg via INTRAVENOUS

## 2015-01-22 MED ORDER — ASPIRIN EC 81 MG PO TBEC
81.0000 mg | DELAYED_RELEASE_TABLET | Freq: Every day | ORAL | Status: DC
  Administered 2015-01-24 – 2015-01-28 (×5): 81 mg via ORAL
  Filled 2015-01-22 (×5): qty 1

## 2015-01-22 MED ORDER — SODIUM CHLORIDE 0.9 % IJ SOLN: 3.0000 mL | INTRAMUSCULAR | Status: DC | PRN

## 2015-01-22 MED ORDER — SODIUM CHLORIDE 0.9 % IV SOLN: 250.0000 mL | INTRAVENOUS | Status: DC | PRN

## 2015-01-22 MED ORDER — REGADENOSON 0.4 MG/5ML IV SOLN
0.4000 mg | Freq: Once | INTRAVENOUS | Status: AC
  Administered 2015-01-22: 0.4 mg via INTRAVENOUS
  Filled 2015-01-22: qty 5

## 2015-01-22 MED ORDER — TECHNETIUM TC 99M SESTAMIBI GENERIC - CARDIOLITE
10.0000 | Freq: Once | INTRAVENOUS | Status: AC | PRN
  Administered 2015-01-22: 10 via INTRAVENOUS

## 2015-01-22 MED ORDER — TECHNETIUM TC 99M SESTAMIBI GENERIC - CARDIOLITE
30.0000 | Freq: Once | INTRAVENOUS | Status: AC | PRN
  Administered 2015-01-22: 30 via INTRAVENOUS

## 2015-01-22 MED ORDER — ASPIRIN 81 MG PO CHEW: 81.0000 mg | CHEWABLE_TABLET | ORAL | Status: AC

## 2015-01-22 MED ORDER — SODIUM CHLORIDE 0.9 % IV SOLN
INTRAVENOUS | Status: DC
  Administered 2015-01-22: 11:00:00 via INTRAVENOUS

## 2015-01-22 MED ORDER — SODIUM CHLORIDE 0.9 % IJ SOLN: 3.0000 mL | Freq: Two times a day (BID) | INTRAMUSCULAR | Status: DC

## 2015-01-22 MED ORDER — SODIUM CHLORIDE 0.9 % IJ SOLN
3.0000 mL | Freq: Two times a day (BID) | INTRAMUSCULAR | Status: DC
  Administered 2015-01-22 (×2): 3 mL via INTRAVENOUS

## 2015-01-22 NOTE — Progress Notes (Signed)
Inpatient Diabetes Program Recommendations  AACE/ADA: New Consensus Statement on Inpatient Glycemic Control (2015)  Target Ranges:  Prepandial:   less than 140 mg/dL      Peak postprandial:   less than 180 mg/dL (1-2 hours)      Critically ill patients:  140 - 180 mg/dL   Review of Glycemic Control  Diabetes history: Hx type 2 Outpatient Diabetes medications: none noted Current orders for Inpatient glycemic control: no orders  Inpatient Diabetes Program Recommendations:   Noted admission lab glucose of 223 mg/dL at [place] last evening. No cbg's ordered. May want to check cbg's q 4 hrs while NPO and use sensitive correction scale if needed.  Thank you Rosita Kea, RN, MSN, CDE  Diabetes Inpatient Program Office: [phone] Pager: [phone] 8:00 am to 5:00 pm

## 2015-01-22 NOTE — Care Management Obs Status (Signed)
[place] NOTIFICATION   Patient Details  Name: [name] MRN: [id number] Date of Birth: Nov 09, 1950   Medicare Observation Status Notification Given:   yes    Royston Bake, RN 01/22/2015, 3:27 PM

## 2015-01-22 NOTE — Progress Notes (Signed)
PROGRESS NOTE  [name] [id number] DOB: [birth year] DOA: 01/21/2015 PCP: [name], MD  HPI: 64 y.o. 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 Patient presents to the ED with c/o chest pain  Subjective / 24 H Interval events - denies chest pain this morning   Assessment/Plan: Principal Problem:   Acute chest pain Active Problems:   Diabetes mellitus (HCC)   CKD (chronic kidney disease), stage III   Chronic diastolic CHF (congestive heart failure) (HCC) Diabetes Mellitus  Acute chest pain  - cardiology was consulted and patient underwent stress test today with findings consistent with ischemia, discussed with cardiology  Chronic diastolic CHF  - appears euvolemic  HTN - continue home medications - blood pressure better this afternoon, continue current regimen   CKD III - Cr at baseline - watch closely  DM  - diet controlled - CBGs within goal today   Recent scabies -  Under treatment, skin lesions improved significantly on today's exam   Diet: Diet heart healthy/carb modified Room service appropriate?: Yes; Fluid consistency:: Thin Fluids: NS DVT Prophylaxis: heparin  Code Status: Full Code Family Communication: no family bedside  Disposition Plan: home when ready   Barriers to discharge: cardiology evaluation, positive stress test   Consultants:  Cardiology   Procedures:  Stress test    Antibiotics  Anti-infectives    None       Studies  Dg Chest 2 View  01/21/2015  CLINICAL DATA:  Chest pain on the left side starting today. EXAM: CHEST  2 VIEW COMPARISON:  December 24, 2014 FINDINGS: The mediastinal contour is normal. The heart size is enlarged. There is diffuse increased pulmonary interstitium. There is no focal pneumonia or pleural effusion. The lungs are hyperinflated. No acute abnormalities identified within the visualized bones. There is  scoliosis of spine. IMPRESSION: Mild interstitial edema. Electronically Signed   By: Abelardo Diesel M.D.   On: 01/21/2015 19:51   Nm Myocar Multi W/spect W/wall Motion / Ef  01/22/2015   There was no ST segment deviation noted during stress.  No T wave inversion was noted during stress.  Defect 1: There is a medium defect of moderate severity.  Findings consistent with ischemia.  Nuclear stress EF: 52%.  Moderate size and intensity, reversible inferior wall perfusion defect suggestive of ischemia (SDS 4). LVEF 52% with inferior hypokinesis. This is an intermediate risk study.    Objective  Filed Vitals:   01/22/15 1247 01/22/15 1248 01/22/15 1250 01/22/15 1254  BP: 138/52 147/52 142/52 138/50  Pulse: 73 77 72 67  Temp:      TempSrc:      Resp:      Height:      Weight:      SpO2:        Intake/Output Summary (Last 24 hours) at 01/22/15 1629 Last data filed at 01/22/15 1627  Gross per 24 hour  Intake    540 ml  Output   1000 ml  Net   -460 ml   Filed Weights   01/21/15 2337 01/21/15 2352 01/22/15 0531  Weight: 61.6 kg (135 lb 12.9 oz) 61.6 kg (135 lb 12.9 oz) 61.6 kg (135 lb 12.9 oz)    Exam:  GENERAL: NAD  HEENT: no scleral icterus, PERRL  NECK: supple, no LAD  LUNGS: CTA biL, no wheezing  HEART: RRR without MRG  ABDOMEN: soft, non tender  EXTREMITIES: no clubbing / cyanosis  NEUROLOGIC: non focal  Data Reviewed: Basic Metabolic Panel:  Recent Labs Lab 01/21/15 1951  NA 138  K 4.1  CL 106  CO2 23  GLUCOSE 223*  BUN 52*  CREATININE 1.61*  CALCIUM 9.7   CBC:  Recent Labs Lab 01/21/15 1951  WBC 8.0  HGB 10.3*  HCT 32.7*  MCV 91.9  PLT 319   Cardiac Enzymes:  Recent Labs Lab 01/22/15 0033 01/22/15 0503 01/22/15 0942  TROPONINI <0.03 <0.03 <0.03   BNP (last 3 results)  Recent Labs  11/07/14 0325 12/24/14 1030 01/21/15 2041  BNP 2634.2* 1288.9* 833.4*   CBG:  Recent Labs Lab 01/22/15 1105  GLUCAP 148*    Scheduled  Meds: . amLODipine  10 mg Oral Daily  . [START ON 01/23/2015] aspirin  81 mg Oral Pre-Cath  . [START ON 01/24/2015] aspirin EC  81 mg Oral Daily  . cloNIDine  0.1 mg Oral BID  . ferrous sulfate  325 mg Oral TID WC  . furosemide  80 mg Oral BID  . heparin  5,000 Units Subcutaneous 3 times per day  . losartan  50 mg Oral Daily  . metoprolol  50 mg Oral BID  . sodium chloride  3 mL Intravenous Q12H   Continuous Infusions:     Marzetta Board, MD [facility] Pager [phone]. If 7 PM - 7 AM, please contact night-coverage at [email or web address], password [facility] 01/22/2015, 4:29 PM

## 2015-01-22 NOTE — Consult Note (Signed)
Reason for Consult: chest pain   Referring Physician: Dr. Alcario Drought   PCP:  [name], MD  Primary Cardiologist: Dr. Argentina Donovan [name] is an 64 y.o. female.    Chief Complaint: chest pain, admitted 01/21/15  HPI:  64 year old female presented to ER 01/21/15 with chest pain.  She had intermittent chest pain described as sharp, lasting 5-10 min, first episode.  This occurred after she developed SOB.  She had some nausea.  When she had second episode her daughter brought her to the ER.  There   NTG did help her pain. She does not have NTG at home.  She does wear home oxygen as needed.   Currently no chest pain and no SOB.    Labs with troponin 0.01--><0.03--> <0.03    BNP elevated at 833 though 4 weeks ago was 1288 and 8 weeks ago 2634.  Mild anemia but this is stable for last 2 months.   EKG: Sinus bradycardia with short PR Left posterior fascicular block ST & T wave abnormality, consider inferior ischemia Abnormal ECG Since last tracing rate faster T wave and ST abnormalities in the inferior leads more significant than on prior   She has a history of CAD (NSTEMI 2007 in setting of anemia; LHC 50% LM, 60% LAD, 30% RI, occluded RCA with L-R collaterals, normal EF, treated medically), chronic diastolic CHF, CKD stage III, tobacco abuse, PVD s/p left common iliac and external iliac artery stents as well as mesenteric/celiac/subclavian stenosis by duplexes below, renal artery stenosis, carotid disease s/p left carotid endarterectomy ~2000, bradycardia limiting BB titration, DM, COPD, HLD, HTN, GI AVM s/p cauterization, iron deficiency anemia, ?possible hx of TIA (in problem list by Dr. Percival Spanish) and scabies.  Recent admit 12/24/14 with CP,SOB,and acute on chronic diastolic HF.  Neg. Troponin.  She diuresesed 4L. With neg troponin chest pain was thought due to acute HF.  No further testing at that time.    Last carotid duplex 112016: stable  left CEA patent  with elevated velocities, 60-79% RICA (stable over serial exams), 25-00% LICA stenosis (stable over serial exams), 50% LECA, elevated velocities in bilateral subclavian arteries.) repeat in 1 year.   - Renal duplex 2014: no obvious evidence of hemodynamically significant stenosis >60%, bilateral intrarenal arteries exhibit absent diastolic flow. There is evidence of elevated velocities of the mid aorta. 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  --Echo 10/2014 - Left ventricle: The cavity size was normal. Wall thickness was increased in a pattern of mild LVH. Systolic function was normal. The estimated ejection fraction was in the range of 55% to 60%. Wall motion was normal; there were no regional wall motion abnormalities. Findings consistent with left ventricular diastolic dysfunction. - Aortic valve: Sclerosis without stenosis. There was no significant regurgitation. - Mitral valve: After careful review of all data, I feel there is no definite mitral stenosis. There was trivial regurgitation. - Left atrium: The atrium was mildly dilated. - Right ventricle: The cavity size was mildly dilated. Systolic function was normal. - Right atrium: The atrium was mildly dilated. - Tricuspid valve: There was mild regurgitation. - Pulmonary arteries: PA peak pressure: 57 mm Hg (S).  Past Medical History  Diagnosis Date  . Peripheral vascular disease ([place])     a.s /p left common iliac and external iliac artery stents. b. H/o subclavian, mesenteric, and celiac artery stenosis (see below).  . Diabetes mellitus   .  COPD (chronic obstructive pulmonary disease) ([place])   . Hyperlipidemia   . Depression   . Chronic diastolic CHF (congestive heart failure) ([place])   . Essential hypertension   . Renal artery stenosis ([place])     a. Duplex 2014: no obvious evidence of hemodynamically significant stenosis >60%, bilateral intrarenal arteries exhibit absent diastolic  flow. There is evidence of elevated velocities of the mid aorta. 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  [name] CAD (coronary artery disease)     a. NSTEMI 2007 in setting of anemia; LHC 50% LM, 60% oLAD, 30% RI, occluded RCA with L-R collaterals, normal EF, treated medically.  . Iron deficiency anemia   . Diabetic retinopathy ([place])   . 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 Carotid artery disease ([place])     a. Duplex 12/2014: left CEA patent with elevated velocities, 60-79% RICA (stable over serial exams), 03-47% LICA stenosis (stable over serial exams), 50% LECA, elevated velocities in bilateral subclavian arteries.  . Hypertensive heart disease with congestive heart failure ([place])   . CKD (chronic kidney disease), stage III   . Tobacco abuse   . Bradycardia     a. Limiting BB titration.    Past Surgical History  Procedure Laterality Date  . Carotid endarterectomy    . Cholecystectomy    . Appendectomy    . Tonsillectomy    . Cesarean section    . Shoulder arthroscopy    . Iliac artery stent      left  . Enteroscopy  01/26/2012    Procedure: ENTEROSCOPY;  Surgeon: Gatha Mayer, MD;  Location: [facility];  Service: Endoscopy;  Laterality: N/A;    Family History  Problem Relation Age of Onset  . Hypertension Mother   . Heart attack Mother   . Hypertension Father   . Angina Father   . Cancer Sister   . Cancer Brother   . Stroke Neg Hx    Social History:  reports that she has been smoking Cigarettes.  She has a 36 pack-year smoking history. She has never used smokeless tobacco. She reports that she does not drink alcohol or use illicit drugs.  Allergies: No Known Allergies  OUTPATIENT MEDICATIONS: No current facility-administered medications on file prior to encounter.   Current Outpatient Prescriptions on File Prior to Encounter    Medication Sig Dispense Refill  . acetaminophen (TYLENOL) 500 MG tablet Take 500 mg by mouth every 6 (six) hours as needed (pain).     [name] albuterol (PROVENTIL HFA;VENTOLIN HFA) 108 (90 BASE) MCG/ACT inhaler Inhale 2 puffs into the lungs every 6 (six) hours as needed for wheezing or shortness of breath. 1 Inhaler 0  . amLODipine (NORVASC) 10 MG tablet Take 10 mg by mouth daily.     [name] aspirin EC 81 MG tablet Take 81 mg by mouth daily.    . Calcium Carbonate Antacid (TUMS PO) Take 1 tablet by mouth daily as needed (acid reflux/heartburn).    . cloNIDine (CATAPRES) 0.1 MG tablet Take 0.1 mg by mouth 2 (two) times daily.  11  . Ferrous Sulfate (IRON) 325 (65 FE) MG TABS Take 325 mg by mouth 3 (three) times daily.     . fish oil-omega-3 fatty acids 1000 MG capsule Take 1 g by mouth 3 (three) times daily. Three times daily    . furosemide (LASIX) 80 MG tablet  Take 0.5 tablets (40 mg total) by mouth 2 (two) times daily. (Patient taking differently: Take 80 mg by mouth 2 (two) times daily. ) 60 tablet 5  . hydrocortisone 2.5 % lotion Apply topically 2 (two) times daily. 59 mL 2  . losartan (COZAAR) 50 MG tablet Take 1 tablet (50 mg total) by mouth daily.    . metoprolol (LOPRESSOR) 50 MG tablet Take 50 mg by mouth 2 (two) times daily.  6  . permethrin (ACTICIN) 5 % cream Apply 1 application topically once. 60 g 0  . [DISCONTINUED] loratadine (CLARITIN) 10 MG tablet Take 10 mg by mouth daily.       Current medications: Scheduled Meds: . amLODipine  10 mg Oral Daily  . aspirin EC  81 mg Oral Daily  . cloNIDine  0.1 mg Oral BID  . ferrous sulfate  325 mg Oral TID WC  . furosemide  80 mg Oral BID  . heparin  5,000 Units Subcutaneous 3 times per day  . losartan  50 mg Oral Daily  . metoprolol  50 mg Oral BID   Continuous Infusions:  PRN Meds:.acetaminophen, albuterol, nitroGLYCERIN, ondansetron (ZOFRAN) IV     Results for orders placed or performed during the hospital encounter of 01/21/15 (from  the past 48 hour(s))  I-stat troponin, ED (not at [facility], [facility])     Status: None   Collection Time: 01/21/15  7:35 PM  Result Value Ref Range   Troponin i, poc 0.01 0.00 - 0.08 ng/mL   Comment 3            Comment: Due to the release kinetics of cTnI, a negative result within the first hours of the onset of symptoms does not rule out myocardial infarction with certainty. If myocardial infarction is still suspected, repeat the test at appropriate intervals.   Basic metabolic panel     Status: Abnormal   Collection Time: 01/21/15  7:51 PM  Result Value Ref Range   Sodium 138 135 - 145 mmol/L   Potassium 4.1 3.5 - 5.1 mmol/L   Chloride 106 101 - 111 mmol/L   CO2 23 22 - 32 mmol/L   Glucose, Bld 223 (H) 65 - 99 mg/dL   BUN 52 (H) 6 - 20 mg/dL   Creatinine, Ser 1.61 (H) 0.44 - 1.00 mg/dL   Calcium 9.7 8.9 - 10.3 mg/dL   GFR calc non Af Amer 33 (L) >60 mL/min   GFR calc Af Amer 38 (L) >60 mL/min    Comment: (NOTE) The eGFR has been calculated using the CKD EPI equation. This calculation has not been validated in all clinical situations. eGFR's persistently <60 mL/min signify possible Chronic Kidney Disease.    Anion gap 9 5 - 15  CBC     Status: Abnormal   Collection Time: 01/21/15  7:51 PM  Result Value Ref Range   WBC 8.0 4.0 - 10.5 K/uL   RBC 3.56 (L) 3.87 - 5.11 MIL/uL   Hemoglobin 10.3 (L) 12.0 - 15.0 g/dL   HCT 32.7 (L) 36.0 - 46.0 %   MCV 91.9 78.0 - 100.0 fL   MCH 28.9 26.0 - 34.0 pg   MCHC 31.5 30.0 - 36.0 g/dL   RDW 15.6 (H) 11.5 - 15.5 %   Platelets 319 150 - 400 K/uL  Brain natriuretic peptide     Status: Abnormal   Collection Time: 01/21/15  8:41 PM  Result Value Ref Range   B Natriuretic Peptide 833.4 (H) 0.0 - 100.0  pg/mL  Troponin I (q 6hr x 3)     Status: None   Collection Time: 01/22/15 12:33 AM  Result Value Ref Range   Troponin I <0.03 <0.031 ng/mL    Comment:        NO INDICATION OF MYOCARDIAL INJURY.   Troponin I (q 6hr x 3)     Status: None    Collection Time: 01/22/15  5:03 AM  Result Value Ref Range   Troponin I <0.03 <0.031 ng/mL    Comment:        NO INDICATION OF MYOCARDIAL INJURY.    Dg Chest 2 View  01/21/2015  CLINICAL DATA:  Chest pain on the left side starting today. EXAM: CHEST  2 VIEW COMPARISON:  December 24, 2014 FINDINGS: The mediastinal contour is normal. The heart size is enlarged. There is diffuse increased pulmonary interstitium. There is no focal pneumonia or pleural effusion. The lungs are hyperinflated. No acute abnormalities identified within the visualized bones. There is scoliosis of spine. IMPRESSION: Mild interstitial edema. Electronically Signed   By: Abelardo Diesel M.D.   On: 01/21/2015 19:51    ROS: General:no colds or fevers,  weight decrease from 01/08/15- but same as discharge on 12/26/14. Skin:no rashes or ulcers HEENT:no blurred vision, no congestion CV:see HPI PUL:see HPI GI:no diarrhea constipation or melena, no indigestion GU:no hematuria, no dysuria MS:no joint pain, no claudication Neuro:no syncope, no lightheadedness Endo:+ diabetes, no thyroid disease   Blood pressure 177/78, pulse 59, temperature 98 F (36.7 C), temperature source Oral, resp. rate 18, height 4' 10"  (1.473 m), weight 135 lb 12.9 oz (61.6 kg), SpO2 94 %.  Wt Readings from Last 3 Encounters:  01/22/15 135 lb 12.9 oz (61.6 kg)  01/08/15 142 lb (64.411 kg)  01/02/15 136 lb (61.689 kg)    PE: General:Pleasant affect, NAD Skin:Warm and dry, brisk capillary refill HEENT:normocephalic, sclera clear, mucus membranes moist Neck:supple, no JVD, + bil bruits  Heart:S1S2 RRR with 1-2 soft systolic aortic murmur, no gallup, rub or click Lungs:clear without rales, rhonchi, or wheezes FUX:NATF, non tender, + BS, do not palpate liver spleen or masses Ext:no lower ext edema, 2+ pedal pulses, 2+ radial pulses Neuro:alert and oriented X 3, MAE, follows commands, + facial symmetry    Assessment/Plan Principal Problem:   Acute  chest pain- negative MI would proceed with lexiscan myoview with mild CKD if positive proceed with cath, unless MD decides otherwise.  Last cath 2007.  Though with her risk factors DM and known CAD including 50% LM in 2007 and 60% ostial LAD one could argue for cardiac cath.   Possible balanced ischemia with nuc alone.   Active Problems:   Diabetes mellitus (HCC)   Chronic diastolic CHF (congestive heart failure) ([place]) stable with last echo in sept with PA pk pressure 57 mmHg.  EF 55-60%. Euvolemic today    CAD with previous cath in 2007 LM with 50% stenosis with calcium, extending into the oLAD, Ramus with 30%,  RCA  was dominant occluded in mid segment with Lt-Rt collaterals.      COPD with home oxygen     Carotid disease stable on last ultrasound 12/2014.      Bradycardia limiting BB titration.      Tobacco use- has decreased to 3-4 cigarettes a day.      PVD      CKD stage 3 stable has seen nephrologist 11/16 Dr. Jon Billings R  Nurse Practitioner Certified [facility]  Pager [phone] or after 5pm or weekends call [phone] 01/22/2015, 9:34 AM

## 2015-01-22 NOTE — Progress Notes (Addendum)
After reading nuc results and discussing with Dr. Radford Pax, will plan for cardiac cath tomorrow to further evaluate CAD.  The patient understands that risks included but are not limited to stroke (1 in 1000), death (1 in 101), kidney failure [usually temporary] (1 in 500), bleeding (1 in 200), allergic reaction [possibly serious] (1 in 200).  I explained to pt and wife.  They are agreeable to proceed.  Holding ARB and Lasix for tomorrow.

## 2015-01-22 NOTE — Progress Notes (Signed)
lexiscan myoview completed, Nuc results to follow.

## 2015-01-23 ENCOUNTER — Encounter (HOSPITAL_COMMUNITY)
Admission: EM | Disposition: A | Discharge status: Home / Self Care | Payer: Self-pay | Source: Home / Self Care | Attending: Internal Medicine

## 2015-01-23 ENCOUNTER — Encounter (HOSPITAL_COMMUNITY): Payer: Self-pay

## 2015-01-23 ENCOUNTER — Other Ambulatory Visit: Payer: Self-pay | Admitting: *Deleted

## 2015-01-23 ENCOUNTER — Encounter (HOSPITAL_COMMUNITY): Payer: Self-pay | Admitting: Interventional Cardiology

## 2015-01-23 DIAGNOSIS — R9439 Abnormal result of other cardiovascular function study: Secondary | ICD-10-CM | POA: Insufficient documentation

## 2015-01-23 DIAGNOSIS — I25110 Atherosclerotic heart disease of native coronary artery with unstable angina pectoris: Secondary | ICD-10-CM

## 2015-01-23 DIAGNOSIS — I2510 Atherosclerotic heart disease of native coronary artery without angina pectoris: Secondary | ICD-10-CM

## 2015-01-23 HISTORY — PX: CARDIAC CATHETERIZATION: SHX172

## 2015-01-23 LAB — BASIC METABOLIC PANEL
ANION GAP: 9 (ref 5–15)
BUN: 42 mg/dL — ABNORMAL HIGH (ref 6–20)
CHLORIDE: 109 mmol/L (ref 101–111)
CO2: 23 mmol/L (ref 22–32)
Calcium: 8.7 mg/dL — ABNORMAL LOW (ref 8.9–10.3)
Creatinine, Ser: 1.39 mg/dL — ABNORMAL HIGH (ref 0.44–1.00)
GFR calc non Af Amer: 39 mL/min — ABNORMAL LOW (ref >60)
GFR, EST AFRICAN AMERICAN: 45 mL/min — AB (ref >60)
GLUCOSE: 107 mg/dL — AB (ref 65–99)
Potassium: 3.7 mmol/L (ref 3.5–5.1)
Sodium: 141 mmol/L (ref 135–145)

## 2015-01-23 LAB — CBC
HCT: 32.5 % — ABNORMAL LOW (ref 36.0–46.0)
Hemoglobin: 10.3 g/dL — ABNORMAL LOW (ref 12.0–15.0)
MCH: 29 pg (ref 26.0–34.0)
MCHC: 31.7 g/dL (ref 30.0–36.0)
MCV: 91.5 fL (ref 78.0–100.0)
PLATELETS: 288 10*3/uL (ref 150–400)
RBC: 3.55 MIL/uL — ABNORMAL LOW (ref 3.87–5.11)
RDW: 15.5 % (ref 11.5–15.5)
WBC: 7.1 10*3/uL (ref 4.0–10.5)

## 2015-01-23 LAB — PROTIME-INR
INR: 1.09 (ref 0.00–1.49)
Prothrombin Time: 14.3 seconds (ref 11.6–15.2)

## 2015-01-23 LAB — GLUCOSE, CAPILLARY
GLUCOSE-CAPILLARY: 170 mg/dL — AB (ref 65–99)
Glucose-Capillary: 103 mg/dL — ABNORMAL HIGH (ref 65–99)
Glucose-Capillary: 124 mg/dL — ABNORMAL HIGH (ref 65–99)
Glucose-Capillary: 145 mg/dL — ABNORMAL HIGH (ref 65–99)

## 2015-01-23 SURGERY — LEFT HEART CATH AND CORONARY ANGIOGRAPHY
Anesthesia: LOCAL

## 2015-01-23 MED ORDER — FENTANYL CITRATE (PF) 100 MCG/2ML IJ SOLN
INTRAMUSCULAR | Status: DC | PRN
Start: 2015-01-23 — End: 2015-01-23
  Administered 2015-01-23: 25 ug via INTRAVENOUS

## 2015-01-23 MED ORDER — HYDRALAZINE HCL 20 MG/ML IJ SOLN
10.0000 mg | INTRAMUSCULAR | Status: DC | PRN
  Administered 2015-01-27: 10 mg via INTRAVENOUS
  Filled 2015-01-23: qty 1

## 2015-01-23 MED ORDER — HEPARIN (PORCINE) IN NACL 2-0.9 UNIT/ML-% IJ SOLN
INTRAMUSCULAR | Status: AC
  Filled 2015-01-23: qty 1500

## 2015-01-23 MED ORDER — SODIUM CHLORIDE 0.9 % IJ SOLN
3.0000 mL | Freq: Two times a day (BID) | INTRAMUSCULAR | Status: DC
  Administered 2015-01-23 – 2015-01-28 (×11): 3 mL via INTRAVENOUS

## 2015-01-23 MED ORDER — ACETAMINOPHEN 325 MG PO TABS: 650.0000 mg | ORAL_TABLET | ORAL | Status: DC | PRN

## 2015-01-23 MED ORDER — HYDRALAZINE HCL 20 MG/ML IJ SOLN
INTRAMUSCULAR | Status: DC | PRN
  Administered 2015-01-23: 10 mg via INTRAVENOUS

## 2015-01-23 MED ORDER — FENTANYL CITRATE (PF) 100 MCG/2ML IJ SOLN
INTRAMUSCULAR | Status: AC
  Filled 2015-01-23: qty 2

## 2015-01-23 MED ORDER — HYDRALAZINE HCL 20 MG/ML IJ SOLN
INTRAMUSCULAR | Status: AC
  Filled 2015-01-23: qty 1

## 2015-01-23 MED ORDER — MIDAZOLAM HCL 2 MG/2ML IJ SOLN
INTRAMUSCULAR | Status: DC | PRN
  Administered 2015-01-23: 1 mg via INTRAVENOUS

## 2015-01-23 MED ORDER — HEPARIN SODIUM (PORCINE) 1000 UNIT/ML IJ SOLN
INTRAMUSCULAR | Status: DC | PRN
  Administered 2015-01-23: 3000 [IU] via INTRAVENOUS

## 2015-01-23 MED ORDER — LIDOCAINE HCL (PF) 1 % IJ SOLN
INTRAMUSCULAR | Status: DC | PRN
  Administered 2015-01-23: 09:00:00

## 2015-01-23 MED ORDER — MIDAZOLAM HCL 2 MG/2ML IJ SOLN
INTRAMUSCULAR | Status: AC
  Filled 2015-01-23: qty 2

## 2015-01-23 MED ORDER — ONDANSETRON HCL 4 MG/2ML IJ SOLN: 4.0000 mg | Freq: Four times a day (QID) | INTRAMUSCULAR | Status: DC | PRN

## 2015-01-23 MED ORDER — IOHEXOL 350 MG/ML SOLN
INTRAVENOUS | Status: DC | PRN
  Administered 2015-01-23: 70 mL via INTRA_ARTERIAL

## 2015-01-23 MED ORDER — SODIUM CHLORIDE 0.9 % IV SOLN: 250.0000 mL | INTRAVENOUS | Status: DC | PRN

## 2015-01-23 MED ORDER — SODIUM CHLORIDE 0.9 % IV SOLN
INTRAVENOUS | Status: AC
  Administered 2015-01-23: 10:00:00 via INTRAVENOUS

## 2015-01-23 MED ORDER — VERAPAMIL HCL 2.5 MG/ML IV SOLN
INTRAVENOUS | Status: AC
  Filled 2015-01-23: qty 2

## 2015-01-23 MED ORDER — LIDOCAINE HCL (PF) 1 % IJ SOLN
INTRAMUSCULAR | Status: AC
  Filled 2015-01-23: qty 30

## 2015-01-23 MED ORDER — SODIUM CHLORIDE 0.9 % IJ SOLN: 3.0000 mL | INTRAMUSCULAR | Status: DC | PRN

## 2015-01-23 MED ORDER — VERAPAMIL HCL 2.5 MG/ML IV SOLN
INTRAVENOUS | Status: DC | PRN
  Administered 2015-01-23: 09:00:00 via INTRA_ARTERIAL

## 2015-01-23 SURGICAL SUPPLY — 17 items
CATH INFINITI 5 FR JL3.5 (CATHETERS) ×2 IMPLANT
CATH INFINITI 5FR ANG PIGTAIL (CATHETERS) ×1 IMPLANT
CATH INFINITI 5FR MULTPACK ANG (CATHETERS) IMPLANT
CATH INFINITI JR4 5F (CATHETERS) ×2 IMPLANT
CATH SOFT-VU 4F 65 STRAIGHT (CATHETERS) IMPLANT
CATH SOFT-VU STRAIGHT 4F 65CM (CATHETERS) ×2
DEVICE RAD COMP TR BAND LRG (VASCULAR PRODUCTS) ×2 IMPLANT
GLIDESHEATH SLEND SS 6F .021 (SHEATH) ×2 IMPLANT
KIT HEART LEFT (KITS) ×2 IMPLANT
PACK CARDIAC CATHETERIZATION (CUSTOM PROCEDURE TRAY) ×2 IMPLANT
SHEATH PINNACLE 5F 10CM (SHEATH) IMPLANT
SYR MEDRAD MARK V 150ML (SYRINGE) ×2 IMPLANT
TRANSDUCER W/STOPCOCK (MISCELLANEOUS) ×2 IMPLANT
TUBING CIL FLEX 10 FLL-RA (TUBING) ×2 IMPLANT
WIRE EMERALD 3MM-J .035X150CM (WIRE) IMPLANT
WIRE HI TORQ VERSACORE-J 145CM (WIRE) ×1 IMPLANT
WIRE SAFE-T 1.5MM-J .035X260CM (WIRE) ×2 IMPLANT

## 2015-01-23 NOTE — Progress Notes (Signed)
Pt arrived to floor around 0921 from cath lab post TRB to Rt,. Wrist.  D&I.  Upper on a pillow.  Encourage pt to keep arm elevated to prevent bleeding.  Verbalized understanding.  Will continue to monitor.  Karie Kirks, RN

## 2015-01-23 NOTE — H&P (View-Only) (Signed)
Reason for Consult: chest pain   Referring Physician: Dr. Alcario Drought   PCP:  [name], MD  Primary Cardiologist: Dr. Argentina Donovan [name] is an 64 y.o. female.    Chief Complaint: chest pain, admitted 01/21/15  HPI:  64 year old female presented to ER 01/21/15 with chest pain.  She had intermittent chest pain described as sharp, lasting 5-10 min, first episode.  This occurred after she developed SOB.  She had some nausea.  When she had second episode her daughter brought her to the ER.  There   NTG did help her pain. She does not have NTG at home.  She does wear home oxygen as needed.   Currently no chest pain and no SOB.    Labs with troponin 0.01--><0.03--> <0.03    BNP elevated at 833 though 4 weeks ago was 1288 and 8 weeks ago 2634.  Mild anemia but this is stable for last 2 months.   EKG: Sinus bradycardia with short PR Left posterior fascicular block ST & T wave abnormality, consider inferior ischemia Abnormal ECG Since last tracing rate faster T wave and ST abnormalities in the inferior leads more significant than on prior   She has a history of CAD (NSTEMI 2007 in setting of anemia; LHC 50% LM, 60% LAD, 30% RI, occluded RCA with L-R collaterals, normal EF, treated medically), chronic diastolic CHF, CKD stage III, tobacco abuse, PVD s/p left common iliac and external iliac artery stents as well as mesenteric/celiac/subclavian stenosis by duplexes below, renal artery stenosis, carotid disease s/p left carotid endarterectomy ~2000, bradycardia limiting BB titration, DM, COPD, HLD, HTN, GI AVM s/p cauterization, iron deficiency anemia, ?possible hx of TIA (in problem list by Dr. Percival Spanish) and scabies.  Recent admit 12/24/14 with CP,SOB,and acute on chronic diastolic HF.  Neg. Troponin.  She diuresesed 4L. With neg troponin chest pain was thought due to acute HF.  No further testing at that time.    Last carotid duplex 112016: stable  left CEA patent  with elevated velocities, 60-79% RICA (stable over serial exams), 42-87% LICA stenosis (stable over serial exams), 50% LECA, elevated velocities in bilateral subclavian arteries.) repeat in 1 year.   - Renal duplex 2014: no obvious evidence of hemodynamically significant stenosis >60%, bilateral intrarenal arteries exhibit absent diastolic flow. There is evidence of elevated velocities of the mid aorta. 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  --Echo 10/2014 - Left ventricle: The cavity size was normal. Wall thickness was increased in a pattern of mild LVH. Systolic function was normal. The estimated ejection fraction was in the range of 55% to 60%. Wall motion was normal; there were no regional wall motion abnormalities. Findings consistent with left ventricular diastolic dysfunction. - Aortic valve: Sclerosis without stenosis. There was no significant regurgitation. - Mitral valve: After careful review of all data, I feel there is no definite mitral stenosis. There was trivial regurgitation. - Left atrium: The atrium was mildly dilated. - Right ventricle: The cavity size was mildly dilated. Systolic function was normal. - Right atrium: The atrium was mildly dilated. - Tricuspid valve: There was mild regurgitation. - Pulmonary arteries: PA peak pressure: 57 mm Hg (S).  Past Medical History  Diagnosis Date  . Peripheral vascular disease ([place])     a.s /p left common iliac and external iliac artery stents. b. H/o subclavian, mesenteric, and celiac artery stenosis (see below).  . Diabetes mellitus   .  COPD (chronic obstructive pulmonary disease) ([place])   . Hyperlipidemia   . Depression   . Chronic diastolic CHF (congestive heart failure) ([place])   . Essential hypertension   . Renal artery stenosis ([place])     a. Duplex 2014: no obvious evidence of hemodynamically significant stenosis >60%, bilateral intrarenal arteries exhibit absent diastolic  flow. There is evidence of elevated velocities of the mid aorta. 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  [name] CAD (coronary artery disease)     a. NSTEMI 2007 in setting of anemia; LHC 50% LM, 60% oLAD, 30% RI, occluded RCA with L-R collaterals, normal EF, treated medically.  . Iron deficiency anemia   . Diabetic retinopathy ([place])   . 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 Carotid artery disease ([place])     a. Duplex 12/2014: left CEA patent with elevated velocities, 60-79% RICA (stable over serial exams), 46-50% LICA stenosis (stable over serial exams), 50% LECA, elevated velocities in bilateral subclavian arteries.  . Hypertensive heart disease with congestive heart failure ([place])   . CKD (chronic kidney disease), stage III   . Tobacco abuse   . Bradycardia     a. Limiting BB titration.    Past Surgical History  Procedure Laterality Date  . Carotid endarterectomy    . Cholecystectomy    . Appendectomy    . Tonsillectomy    . Cesarean section    . Shoulder arthroscopy    . Iliac artery stent      left  . Enteroscopy  01/26/2012    Procedure: ENTEROSCOPY;  Surgeon: Gatha Mayer, MD;  Location: [facility];  Service: Endoscopy;  Laterality: N/A;    Family History  Problem Relation Age of Onset  . Hypertension Mother   . Heart attack Mother   . Hypertension Father   . Angina Father   . Cancer Sister   . Cancer Brother   . Stroke Neg Hx    Social History:  reports that she has been smoking Cigarettes.  She has a 36 pack-year smoking history. She has never used smokeless tobacco. She reports that she does not drink alcohol or use illicit drugs.  Allergies: No Known Allergies  OUTPATIENT MEDICATIONS: No current facility-administered medications on file prior to encounter.   Current Outpatient Prescriptions on File Prior to Encounter    Medication Sig Dispense Refill  . acetaminophen (TYLENOL) 500 MG tablet Take 500 mg by mouth every 6 (six) hours as needed (pain).     [name] albuterol (PROVENTIL HFA;VENTOLIN HFA) 108 (90 BASE) MCG/ACT inhaler Inhale 2 puffs into the lungs every 6 (six) hours as needed for wheezing or shortness of breath. 1 Inhaler 0  . amLODipine (NORVASC) 10 MG tablet Take 10 mg by mouth daily.     [name] aspirin EC 81 MG tablet Take 81 mg by mouth daily.    . Calcium Carbonate Antacid (TUMS PO) Take 1 tablet by mouth daily as needed (acid reflux/heartburn).    . cloNIDine (CATAPRES) 0.1 MG tablet Take 0.1 mg by mouth 2 (two) times daily.  11  . Ferrous Sulfate (IRON) 325 (65 FE) MG TABS Take 325 mg by mouth 3 (three) times daily.     . fish oil-omega-3 fatty acids 1000 MG capsule Take 1 g by mouth 3 (three) times daily. Three times daily    . furosemide (LASIX) 80 MG tablet  Take 0.5 tablets (40 mg total) by mouth 2 (two) times daily. (Patient taking differently: Take 80 mg by mouth 2 (two) times daily. ) 60 tablet 5  . hydrocortisone 2.5 % lotion Apply topically 2 (two) times daily. 59 mL 2  . losartan (COZAAR) 50 MG tablet Take 1 tablet (50 mg total) by mouth daily.    . metoprolol (LOPRESSOR) 50 MG tablet Take 50 mg by mouth 2 (two) times daily.  6  . permethrin (ACTICIN) 5 % cream Apply 1 application topically once. 60 g 0  . [DISCONTINUED] loratadine (CLARITIN) 10 MG tablet Take 10 mg by mouth daily.       Current medications: Scheduled Meds: . amLODipine  10 mg Oral Daily  . aspirin EC  81 mg Oral Daily  . cloNIDine  0.1 mg Oral BID  . ferrous sulfate  325 mg Oral TID WC  . furosemide  80 mg Oral BID  . heparin  5,000 Units Subcutaneous 3 times per day  . losartan  50 mg Oral Daily  . metoprolol  50 mg Oral BID   Continuous Infusions:  PRN Meds:.acetaminophen, albuterol, nitroGLYCERIN, ondansetron (ZOFRAN) IV     Results for orders placed or performed during the hospital encounter of 01/21/15 (from  the past 48 hour(s))  I-stat troponin, ED (not at [facility], [facility])     Status: None   Collection Time: 01/21/15  7:35 PM  Result Value Ref Range   Troponin i, poc 0.01 0.00 - 0.08 ng/mL   Comment 3            Comment: Due to the release kinetics of cTnI, a negative result within the first hours of the onset of symptoms does not rule out myocardial infarction with certainty. If myocardial infarction is still suspected, repeat the test at appropriate intervals.   Basic metabolic panel     Status: Abnormal   Collection Time: 01/21/15  7:51 PM  Result Value Ref Range   Sodium 138 135 - 145 mmol/L   Potassium 4.1 3.5 - 5.1 mmol/L   Chloride 106 101 - 111 mmol/L   CO2 23 22 - 32 mmol/L   Glucose, Bld 223 (H) 65 - 99 mg/dL   BUN 52 (H) 6 - 20 mg/dL   Creatinine, Ser 1.61 (H) 0.44 - 1.00 mg/dL   Calcium 9.7 8.9 - 10.3 mg/dL   GFR calc non Af Amer 33 (L) >60 mL/min   GFR calc Af Amer 38 (L) >60 mL/min    Comment: (NOTE) The eGFR has been calculated using the CKD EPI equation. This calculation has not been validated in all clinical situations. eGFR's persistently <60 mL/min signify possible Chronic Kidney Disease.    Anion gap 9 5 - 15  CBC     Status: Abnormal   Collection Time: 01/21/15  7:51 PM  Result Value Ref Range   WBC 8.0 4.0 - 10.5 K/uL   RBC 3.56 (L) 3.87 - 5.11 MIL/uL   Hemoglobin 10.3 (L) 12.0 - 15.0 g/dL   HCT 32.7 (L) 36.0 - 46.0 %   MCV 91.9 78.0 - 100.0 fL   MCH 28.9 26.0 - 34.0 pg   MCHC 31.5 30.0 - 36.0 g/dL   RDW 15.6 (H) 11.5 - 15.5 %   Platelets 319 150 - 400 K/uL  Brain natriuretic peptide     Status: Abnormal   Collection Time: 01/21/15  8:41 PM  Result Value Ref Range   B Natriuretic Peptide 833.4 (H) 0.0 - 100.0  pg/mL  Troponin I (q 6hr x 3)     Status: None   Collection Time: 01/22/15 12:33 AM  Result Value Ref Range   Troponin I <0.03 <0.031 ng/mL    Comment:        NO INDICATION OF MYOCARDIAL INJURY.   Troponin I (q 6hr x 3)     Status: None    Collection Time: 01/22/15  5:03 AM  Result Value Ref Range   Troponin I <0.03 <0.031 ng/mL    Comment:        NO INDICATION OF MYOCARDIAL INJURY.    Dg Chest 2 View  01/21/2015  CLINICAL DATA:  Chest pain on the left side starting today. EXAM: CHEST  2 VIEW COMPARISON:  December 24, 2014 FINDINGS: The mediastinal contour is normal. The heart size is enlarged. There is diffuse increased pulmonary interstitium. There is no focal pneumonia or pleural effusion. The lungs are hyperinflated. No acute abnormalities identified within the visualized bones. There is scoliosis of spine. IMPRESSION: Mild interstitial edema. Electronically Signed   By: Abelardo Diesel M.D.   On: 01/21/2015 19:51    ROS: General:no colds or fevers,  weight decrease from 01/08/15- but same as discharge on 12/26/14. Skin:no rashes or ulcers HEENT:no blurred vision, no congestion CV:see HPI PUL:see HPI GI:no diarrhea constipation or melena, no indigestion GU:no hematuria, no dysuria MS:no joint pain, no claudication Neuro:no syncope, no lightheadedness Endo:+ diabetes, no thyroid disease   Blood pressure 177/78, pulse 59, temperature 98 F (36.7 C), temperature source Oral, resp. rate 18, height 4' 10"  (1.473 m), weight 135 lb 12.9 oz (61.6 kg), SpO2 94 %.  Wt Readings from Last 3 Encounters:  01/22/15 135 lb 12.9 oz (61.6 kg)  01/08/15 142 lb (64.411 kg)  01/02/15 136 lb (61.689 kg)    PE: General:Pleasant affect, NAD Skin:Warm and dry, brisk capillary refill HEENT:normocephalic, sclera clear, mucus membranes moist Neck:supple, no JVD, + bil bruits  Heart:S1S2 RRR with 1-2 soft systolic aortic murmur, no gallup, rub or click Lungs:clear without rales, rhonchi, or wheezes NLG:XQJJ, non tender, + BS, do not palpate liver spleen or masses Ext:no lower ext edema, 2+ pedal pulses, 2+ radial pulses Neuro:alert and oriented X 3, MAE, follows commands, + facial symmetry    Assessment/Plan Principal Problem:   Acute  chest pain- negative MI would proceed with lexiscan myoview with mild CKD if positive proceed with cath, unless MD decides otherwise.  Last cath 2007.  Though with her risk factors DM and known CAD including 50% LM in 2007 and 60% ostial LAD one could argue for cardiac cath.   Possible balanced ischemia with nuc alone.   Active Problems:   Diabetes mellitus (HCC)   Chronic diastolic CHF (congestive heart failure) ([place]) stable with last echo in sept with PA pk pressure 57 mmHg.  EF 55-60%. Euvolemic today    CAD with previous cath in 2007 LM with 50% stenosis with calcium, extending into the oLAD, Ramus with 30%,  RCA  was dominant occluded in mid segment with Lt-Rt collaterals.      COPD with home oxygen     Carotid disease stable on last ultrasound 12/2014.      Bradycardia limiting BB titration.      Tobacco use- has decreased to 3-4 cigarettes a day.      PVD      CKD stage 3 stable has seen nephrologist 11/16 Dr. Jon Billings R  Nurse Practitioner Certified [facility]  Pager [phone] or after 5pm or weekends call [phone] 01/22/2015, 9:34 AM

## 2015-01-23 NOTE — Progress Notes (Signed)
Air (complete 15cc) was removed from TR Band without complication.  TR Band removed, and gauze/tegaderm placed at site.  VSS.  Pt ambulatory.

## 2015-01-23 NOTE — Interval H&P Note (Signed)
Cath Lab Visit (complete for each Cath Lab visit)  Clinical Evaluation Leading to the Procedure:   ACS: Yes.    Non-ACS:    Anginal Classification: CCS IV  Anti-ischemic medical therapy: Minimal Therapy (1 class of medications)  Non-Invasive Test Results: Intermediate-risk stress test findings: cardiac mortality 1-3%/year  Prior CABG: No previous CABG      History and Physical Interval Note:  01/23/2015 8:25 AM  [name]  has presented today for surgery, with the diagnosis of chest pain  The various methods of treatment have been discussed with the patient and family. After consideration of risks, benefits and other options for treatment, the patient has consented to  Procedure(s): Left Heart Cath and Coronary Angiography (N/A) as a surgical intervention .  The patient's history has been reviewed, patient examined, no change in status, stable for surgery.  I have reviewed the patient's chart and labs.  Questions were answered to the patient's satisfaction.     VARANASI,JAYADEEP S.

## 2015-01-23 NOTE — Consult Note (Addendum)
[address]       [place] [address]             [phone]          CARDIOTHORACIC SURGERY CONSULTATION REPORT  PCP is HOLLIS, Asencion Partridge, FNP Referring Provider is [name], MD Primary Cardiologist is Minus Breeding, MD   Reason for consultation:  Coronary artery disease  HPI:  Patient is a 64 year old female with known history of coronary artery disease (NSTEMI 2007 in setting of anemia; LHC 50% LM, 60% oLAD, 30% RI, occluded RCA with L-R collaterals, normal EF, treated medically), hypertension,COPD with ongoing heavy tobacco abuse on home oxygen therapy,chronic diastolic congestive heart failure, type 2 diabetes mellitus with complications, peripheral vascular disease, stage III chronic kidney disease, iron deficiency anemia, and hyperlipidemia who has been referred for surgical consultation to discuss treatment options for management of multivessel coronary artery disease.  The patient's cardiac history dates back to 2007 when she suffered a non-ST segment elevation myocardial infarction that occurred in the setting of severe anemia. Catheterization performed at that time revealed 50% stenosis of the distal left main coronary artery with 60% ostial stenosis of the left anterior descending coronary artery and chronically occluded right coronary artery with left-to-right collaterals. Left ventricular function was normal. Medical therapy was recommended.  The patient has been followed intermittently ever since by Dr. Percival Spanish, most recently in March of this year. Transthoracic echocardiogram performed 11/07/2014 revealed normal left ventricular systolic function with ejection fraction estimated at 55-60%. There was mild left ventricular hypertrophy and aortic valve sclerosis without significant aortic stenosis. There was moderate pulmonary hypertension with PA pressures estimated 57 mmHg. She was hospitalized in early November with atypical chest discomfort  and acute on chronic diastolic congestive heart failure. Troponin levels were negative. She was treated with diuretic therapy and symptoms improved.  She was seen in follow-up on 01/02/2015 at which time she was reportedly doing well.  Patient was readmitted to the hospital 01/21/2015 with chest discomfort and shortness of breath associated with severe hypertension.  Blood pressure and symptoms improved with nitroglycerin administration. She was admitted to the hospital and ruled out for acute myocardial infarction.  Nuclear stress test was performed demonstrating resting ejection fraction 52% with moderate size reversible inferior wall perfusion defect suggestive of inferior wall ischemia. The patient underwent diagnostic cardiac catheterization by Dr. Irish Lack demonstrating the presence of findings similar to the catheterization performed in 2007 with 50% stenosis of the distal left main coronary artery and 70% ostial stenosis of the left anterior descending coronary artery. There is 100% chronic occlusion of the right coronary artery with left-to-right collateral filling of the terminal branches of the right coronary artery. There is high-grade stenosis of the distal left circumflex coronary artery in the AV groove. Left ventricular function was not assessed at the time of catheterization.  Right heart catheterization was not performed.  Cardiothoracic surgery consultation was requested.  The patient is married and lives locally in [place] with her husband, son, and grandson. She is not working.  She lives a sedentary lifestyle. She is limited primarily by exertional shortness of breath. She has a long-standing history of heavy tobacco abuse and currently admits to smoking between 2 and 3 packs of cigarettes daily. She uses oxygen at home intermittently when her breathing is worse. She has a long-standing history of exertional shortness of breath. She states that this has progressed over the last few months or  weeks. She  describes occasional tightness across her chest may or may not be associated with activity. She has not had dizzy spells or syncope. She denies nocturnal chest discomfort or shortness of breath. She has a chronic cough that is occasionally productive of whitish sputum. She denies hemoptysis or wheezing. Appetite is fairly good. Her mobility is reportedly reasonably good. She describes that her primary limitation with ambulation his fatigue in both legs.   Past Medical History  Diagnosis Date  . Peripheral vascular disease ([place])     a.s /p left common iliac and external iliac artery stents. b. H/o subclavian, mesenteric, and celiac artery stenosis (see below).  . Diabetes mellitus   . COPD (chronic obstructive pulmonary disease) ([facility])     home oxygen  . Hyperlipidemia   . Depression   . Chronic diastolic CHF (congestive heart failure) ([place])   . Essential hypertension   . Renal artery stenosis ([place])     a. Duplex 2014: no obvious evidence of hemodynamically significant stenosis >60%, bilateral intrarenal arteries exhibit absent diastolic flow. There is evidence of elevated velocities of the mid aorta. 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  [name] CAD (coronary artery disease)   . Iron deficiency anemia   . Diabetic retinopathy ([place] [place])   . 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 Carotid artery disease ([facility])     a. Duplex 12/2014: left CEA patent with elevated velocities, 60-79% RICA (stable over serial exams), [id number] LICA stenosis (stable over serial exams), 50% LECA, elevated velocities in bilateral subclavian arteries.  . Hypertensive heart disease with congestive heart failure ([place])   . CKD (chronic kidney disease), stage III   . Tobacco abuse   . Bradycardia     a. Limiting BB titration.  . Anemia, chronic disease 05/16/2008    Qualifier:  Diagnosis of  By: Percival Spanish, MD, Farrel Gordon    . Dementia     Past Surgical History  Procedure Laterality Date  . Carotid endarterectomy    . Cholecystectomy    . Appendectomy    . Tonsillectomy    . Cesarean section    . Shoulder arthroscopy    . Iliac artery stent      left  . Enteroscopy  01/26/2012    Procedure: ENTEROSCOPY;  Surgeon: Gatha Mayer, MD;  Location: [facility];  Service: Endoscopy;  Laterality: N/A;  . Cardiac catheterization N/A 01/23/2015    Procedure: Left Heart Cath and Coronary Angiography;  Surgeon: [name], MD;  Location: [facility];  Service: Cardiovascular;  Laterality: N/A;    Family History  Problem Relation Age of Onset  . Hypertension Mother   . Heart attack Mother   . Hypertension Father   . Angina Father   . Cancer Sister   . Cancer Brother   . Stroke Neg Hx     Social History   Social History  . Marital Status: Married    Spouse Name: N/A  . Number of Children: N/A  . Years of Education: N/A   Occupational History  . Not on file.   Social History Main Topics  . Smoking status: Current Every Day Smoker -- 1.00 packs/day for 36 years    Types: Cigarettes  . Smokeless tobacco: Never Used     Comment: uses e-cig  . Alcohol Use: No  . Drug Use: No  . Sexual Activity:  No   Other Topics Concern  . Not on file   Social History Narrative    Prior to Admission medications   Medication Sig Start Date End Date Taking? Authorizing Provider  acetaminophen (TYLENOL) 500 MG tablet Take 500 mg by mouth every 6 (six) hours as needed (pain).    Yes Historical Provider, MD  albuterol (PROVENTIL HFA;VENTOLIN HFA) 108 (90 BASE) MCG/ACT inhaler Inhale 2 puffs into the lungs every 6 (six) hours as needed for wheezing or shortness of breath. 01/08/15  Yes Dorena Dew, FNP  amLODipine (NORVASC) 10 MG tablet Take 10 mg by mouth daily.    Yes Historical Provider, MD  aspirin EC 81 MG tablet Take 81 mg by mouth daily.   Yes  Historical Provider, MD  Calcium Carbonate Antacid (TUMS PO) Take 1 tablet by mouth daily as needed (acid reflux/heartburn).   Yes Historical Provider, MD  cloNIDine (CATAPRES) 0.1 MG tablet Take 0.1 mg by mouth 2 (two) times daily. 11/14/14  Yes Historical Provider, MD  Ferrous Sulfate (IRON) 325 (65 FE) MG TABS Take 325 mg by mouth 3 (three) times daily.    Yes Historical Provider, MD  fish oil-omega-3 fatty acids 1000 MG capsule Take 1 g by mouth 3 (three) times daily. Three times daily   Yes Historical Provider, MD  furosemide (LASIX) 80 MG tablet Take 0.5 tablets (40 mg total) by mouth 2 (two) times daily. Patient taking differently: Take 80 mg by mouth 2 (two) times daily.  12/26/14  Yes Brett Canales, PA-C  hydrocortisone 2.5 % lotion Apply topically 2 (two) times daily. 01/08/15  Yes Dorena Dew, FNP  losartan (COZAAR) 50 MG tablet Take 1 tablet (50 mg total) by mouth daily. 11/09/14  Yes Velvet Bathe, MD  metoprolol (LOPRESSOR) 50 MG tablet Take 50 mg by mouth 2 (two) times daily. 10/08/14  Yes Historical Provider, MD  permethrin (ACTICIN) 5 % cream Apply 1 application topically once. 01/08/15  Yes Dorena Dew, FNP    Current Facility-Administered Medications  Medication Dose Route Frequency Provider Last Rate Last Dose  . 0.9 %  sodium chloride infusion  250 mL Intravenous PRN [name], MD      . acetaminophen (TYLENOL) tablet 650 mg  650 mg Oral Q4H PRN [name], MD      . albuterol (PROVENTIL) (2.5 MG/3ML) 0.083% nebulizer solution 2.5 mg  2.5 mg Nebulization Q6H PRN [name], DO      . amLODipine (NORVASC) tablet 10 mg  10 mg Oral Daily [name], DO   10 mg at 01/23/15 1013  . [START ON 01/24/2015] aspirin EC tablet 81 mg  81 mg Oral Daily Costin Karlyne Greenspan, MD      . cloNIDine (CATAPRES) tablet 0.1 mg  0.1 mg Oral BID [name], DO   0.1 mg at 01/23/15 1013  . ferrous sulfate tablet 325 mg  325 mg Oral TID WC [name], DO   325  mg at 01/23/15 1646  . heparin injection 5,000 Units  5,000 Units Subcutaneous 3 times per day [name], DO   5,000 Units at 01/23/15 1339  . hydrALAZINE (APRESOLINE) injection 10-20 mg  10-20 mg Intravenous Q4H PRN [name], MD      . metoprolol tartrate (LOPRESSOR) tablet 50 mg  50 mg Oral BID Rhetta Mura Schorr, NP   50 mg at 01/23/15 1013  . nitroGLYCERIN (NITROSTAT) SL tablet 0.4 mg  0.4 mg  Sublingual Q5 min PRN Nathaniel Man, MD   0.4 mg at 01/21/15 2040  . ondansetron (ZOFRAN) injection 4 mg  4 mg Intravenous Q6H PRN [name], MD      . sodium chloride 0.9 % injection 3 mL  3 mL Intravenous Q12H [name], MD   3 mL at 01/23/15 1340  . sodium chloride 0.9 % injection 3 mL  3 mL Intravenous PRN [name], MD        No Known Allergies    Review of Systems:   General:  normal appetite, no energy, no weight gain, + some weight loss, no fever  Cardiac:  + chest pain with exertion, + chest pain at rest, + SOB with exertion, no resting SOB, no PND, no orthopnea, no palpitations, no arrhythmia, no atrial fibrillation, no LE edema, no dizzy spells, no syncope  Respiratory:  + shortness of breath, + home oxygen, occasional productive cough, + chronic dry cough, + bronchitis, no wheezing, no hemoptysis, no asthma, no pain with inspiration or cough, no sleep apnea, no CPAP at night  GI:   no difficulty swallowing, no reflux, no frequent heartburn, no hiatal hernia, no abdominal pain, no constipation, no diarrhea, no hematochezia, no hematemesis, no melena  GU:   no dysuria,  no frequency, no urinary tract infection, no hematuria, no kidney stones, + kidney disease  Vascular:  no pain suggestive of claudication, no pain in feet, no leg cramps, no varicose veins, no DVT, no non-healing foot ulcer  Neuro:   no stroke, no TIA's, no seizures, no headaches, no temporary blindness one eye,  no slurred speech, no peripheral neuropathy, no chronic pain, no  instability of gait, + mild memory/cognitive dysfunction - recently diagnosed with dementia  Musculoskeletal: mild arthritis - primarily involving the wrists, no joint swelling, no myalgias, no difficulty walking, normal mobility   Skin:   no rash, no itching, no skin infections, no pressure sores or ulcerations  Psych:   no anxiety, + depression, no nervousness, no unusual recent stress  Eyes:   + blurry vision, no floaters, no recent vision changes, + wears glasses or contacts  ENT:   no hearing loss, no loose or painful teeth but poor dentition, no dentures, last saw dentist many years ago  Hematologic:  no easy bruising, no abnormal bleeding, no clotting disorder, no frequent epistaxis  Endocrine:  + diabetes, occasionally checks CBG's at home     Physical Exam:   BP 157/46 mmHg  Pulse 56  Temp(Src) 97.9 F (36.6 C) (Oral)  Resp 18  Ht 4\' 10"  (1.473 m)  Wt 60.51 kg (133 lb 6.4 oz)  BMI 27.89 kg/m2  SpO2 93%  General:  Chronically ill-appearing, looks older than stated age  HEENT:  Unremarkable   Neck:   no JVD, no bruits, no adenopathy   Chest:   clear to auscultation, symmetrical breath sounds, no wheezes, no rhonchi   CV:   RRR, + grade II/VI systolic murmur best RSB  Abdomen:  soft, non-tender, no masses   Extremities:  warm, well-perfused, pulses not palpable, no lower extremity edema  Rectal/GU  Deferred  Neuro:   Grossly non-focal and symmetrical throughout  Skin:   Clean and dry, no rashes, no breakdown  Diagnostic Tests:  Transthoracic Echocardiography  Patient:  Sylvette, Morace MR #:    [id number] Study Date: 11/07/2014 Gender:   F Age:    78 Height:   144.8 cm Weight:   65.8 kg  BSA:    1.66 m^2 Pt. Status: Room:  ATTENDING  Velvet Bathe [id number] PERFORMING  [facility], Inpatient SONOGRAPHER [facility] ORDERING   Danford, Christopher P REFERRING  Danford, Christopher  P  cc:  ------------------------------------------------------------------- LV EF: 55% -  60%  ------------------------------------------------------------------- Indications:   CHF - 428.0.  ------------------------------------------------------------------- History:  PMH: Bradycardia. Coronary artery disease. Stroke. Chronic obstructive pulmonary disease. Risk factors: Current tobacco use. Hypertension. Diabetes mellitus. Dyslipidemia.  ------------------------------------------------------------------- Study Conclusions  - Left ventricle: The cavity size was normal. Wall thickness was increased in a pattern of mild LVH. Systolic function was normal. The estimated ejection fraction was in the range of 55% to 60%. Wall motion was normal; there were no regional wall motion abnormalities. Findings consistent with left ventricular diastolic dysfunction. - Aortic valve: Sclerosis without stenosis. There was no significant regurgitation. - Mitral valve: After careful review of all data, I feel there is no definite mitral stenosis. There was trivial regurgitation. - Left atrium: The atrium was mildly dilated. - Right ventricle: The cavity size was mildly dilated. Systolic function was normal. - Right atrium: The atrium was mildly dilated. - Tricuspid valve: There was mild regurgitation. - Pulmonary arteries: PA peak pressure: 57 mm Hg (S).  Transthoracic echocardiography. M-mode, complete 2D, spectral Doppler, and color Doppler. Birthdate: Patient birthdate: [birth year]. Age: Patient is 64 yr old. Sex: Gender: female. BMI: 31.4 kg/m^2. Blood pressure:   145/110 Patient status: Inpatient. Study date: Study date: 11/07/2014. Study time: 10:37 AM. Location: Emergency department.  -------------------------------------------------------------------  ------------------------------------------------------------------- Left ventricle: The  cavity size was normal. Wall thickness was increased in a pattern of mild LVH. Systolic function was normal. The estimated ejection fraction was in the range of 55% to 60%. Wall motion was normal; there were no regional wall motion abnormalities. Findings consistent with left ventricular diastolic dysfunction.  ------------------------------------------------------------------- Aortic valve: Sclerosis without stenosis. Doppler: There was no significant regurgitation.  VTI ratio of LVOT to aortic valve: 0.66. Valve area (VTI): 1.33 cm^2. Indexed valve area (VTI): 0.8 cm^2/m^2. Peak velocity ratio of LVOT to aortic valve: 0.57. Valve area (Vmax): 1.15 cm^2. Indexed valve area (Vmax): 0.69 cm^2/m^2. Mean velocity ratio of LVOT to aortic valve: 0.61. Valve area (Vmean): 1.22 cm^2. Indexed valve area (Vmean): 0.74 cm^2/m^2. Mean gradient (S): 6 mm Hg. Peak gradient (S): 13 mm Hg.  ------------------------------------------------------------------- Aorta: Aortic root: The aortic root was normal in size.  ------------------------------------------------------------------- Mitral valve: After careful review of all data, I feel there is no definite mitral stenosis. Doppler: There was trivial regurgitation.  Valve area by continuity equation (using LVOT flow): 0.84 cm^2. Indexed valve area by continuity equation (using LVOT flow): 0.51 cm^2/m^2.  Mean gradient (D): 4 mm Hg. Peak gradient (D): 13 mm Hg.  ------------------------------------------------------------------- Left atrium: The atrium was mildly dilated.  ------------------------------------------------------------------- Right ventricle: The cavity size was mildly dilated. Systolic function was normal.  ------------------------------------------------------------------- Pulmonic valve:  The valve appears to be grossly normal. Doppler: There was no significant  regurgitation.  ------------------------------------------------------------------- Tricuspid valve:  The valve appears to be grossly normal. Doppler: There was mild regurgitation.  ------------------------------------------------------------------- Right atrium: The atrium was mildly dilated.  ------------------------------------------------------------------- Pericardium: There was no pericardial effusion.  ------------------------------------------------------------------- Measurements  Left ventricle              Value     Reference LV ID, ED, PLAX chordal      (L)   39.5 mm    43 - 52 LV ID, ES, PLAX chordal  24.6 mm    23 - 38 LV fx shortening, PLAX chordal      38  %    >=29 LV PW thickness, ED            10.1 mm    --------- IVS/LV PW ratio, ED            1.26      <=1.3 Stroke volume, 2D             72  ml    --------- Stroke volume/bsa, 2D           43  ml/m^2  --------- LV ejection fraction, 1-p A4C       64  %    --------- LV end-diastolic volume, 2-p       96  ml    --------- LV end-systolic volume, 2-p        33  ml    --------- LV ejection fraction, 2-p         66  %    --------- Stroke volume, 2-p            63  ml    --------- LV end-diastolic volume/bsa, 2-p     58  ml/m^2  --------- LV end-systolic volume/bsa, 2-p      20  ml/m^2  --------- Stroke volume/bsa, 2-p          38  ml/m^2  --------- LV e&', lateral              6.8  cm/s   --------- LV E/e&', lateral             26.47     --------- LV e&', medial               3.95 cm/s   --------- LV E/e&', medial              45.57     --------- LV e&', average              5.38 cm/s    --------- LV E/e&', average             33.49     ---------  Ventricular septum            Value     Reference IVS thickness, ED             12.7 mm    ---------  LVOT                   Value     Reference LVOT ID, S                16  mm    --------- LVOT area                 2.01 cm^2   --------- LVOT peak velocity, S           102  cm/s   --------- LVOT mean velocity, S           71.6 cm/s   --------- LVOT VTI, S                36  cm    --------- LVOT peak gradient, S           4   mm Hg  ---------  Aortic valve               Value     Reference Aortic valve  peak velocity, S       178  cm/s   --------- Aortic valve mean velocity, S       118  cm/s   --------- Aortic valve VTI, S            54.4 cm    --------- Aortic mean gradient, S          6   mm Hg  --------- Aortic peak gradient, S          13  mm Hg  --------- VTI ratio, LVOT/AV            0.66      --------- Aortic valve area, VTI          1.33 cm^2   --------- Aortic valve area/bsa, VTI        0.8  cm^2/m^2 --------- Velocity ratio, peak, LVOT/AV       0.57      --------- Aortic valve area, peak velocity     1.15 cm^2   --------- Aortic valve area/bsa, peak        0.69 cm^2/m^2 --------- velocity Velocity ratio, mean, LVOT/AV       0.61      --------- Aortic valve area, mean velocity     1.22 cm^2   --------- Aortic valve area/bsa, mean        0.74 cm^2/m^2 --------- velocity  Aorta                   Value     Reference Aortic root ID, ED            30  mm    ---------  Left atrium                 Value     Reference LA ID, A-P, ES              38  mm    --------- LA ID/bsa, A-P          (H)   2.29 cm/m^2  <=2.2 LA volume, S               41  ml    --------- LA volume/bsa, S             24.7 ml/m^2  --------- LA volume, ES, 1-p A4C          37  ml    --------- LA volume/bsa, ES, 1-p A4C        22.3 ml/m^2  --------- LA volume, ES, 1-p A2C          40  ml    --------- LA volume/bsa, ES, 1-p A2C        24.1 ml/m^2  ---------  Mitral valve               Value     Reference Mitral E-wave peak velocity        180  cm/s   --------- Mitral A-wave peak velocity        90.5 cm/s   --------- Mitral mean velocity, D          81.5 cm/s   --------- Mitral deceleration time         216  ms    150 - 230 Mitral mean gradient, D          4   mm Hg  --------- Mitral peak gradient, D          13  mm Hg  --------- Mitral E/A ratio, peak          2       --------- Mitral valve area, LVOT          0.84 cm^2   --------- continuity Mitral valve area/bsa, LVOT        0.51 cm^2/m^2 --------- continuity Mitral annulus VTI, D           85.8 cm    ---------  Pulmonary arteries            Value     Reference PA pressure, S, DP        (H)   57  mm Hg  <=30  Tricuspid valve              Value     Reference Tricuspid regurg peak velocity      349  cm/s   --------- Tricuspid peak RV-RA gradient       49  mm Hg  ---------  Systemic veins              Value     Reference Estimated CVP               8   mm Hg  ---------  Right ventricle              Value     Reference RV pressure, S, DP         (H)   57  mm Hg  <=30 RV s&', lateral, S             10.2 cm/s   ---------  Legend: (L) and (H) mark values outside specified reference range.  ------------------------------------------------------------------- Prepared and Electronically Authenticated by  Dola Argyle, MD 2016-09-16T12:34:01    NUCLEAR MEDICINE MYOCARDIAL PERFUSION STRESS TEST   Study Result      There was no ST segment deviation noted during stress.  No T wave inversion was noted during stress.  Defect 1: There is a medium defect of moderate severity.  Findings consistent with ischemia.  Nuclear stress EF: 52%.  Moderate size and intensity, reversible inferior wall perfusion defect suggestive of ischemia (SDS 4). LVEF 52% with inferior hypokinesis. This is an intermediate risk study.      CARDIAC CATHETERIZATION  Procedures    Left Heart Cath and Coronary Angiography    Conclusion     Ost RCA to Prox RCA lesion, 99% stenosed.  Prox RCA to Mid RCA lesion, 100% stenosed.  Ost LAD to Prox LAD lesion, 70% stenosed.  LM lesion, 50% stenosed.  Mid Cx to Dist Cx lesion, 90% stenosed.  Severe three vessel CAD.  Elevated LVEDP.  15 mm Hg across the right subclavian stenosis.  Will obtain cardiac surgery consult. She needs to stop smoking.      Indications    Abnormal nuclear stress test [R93.1 (ICD-10-CM)]    Technique and Indications    The risks, benefits, and details of the procedure were explained to the patient. The patient verbalized understanding and wanted to proceed. Informed written consent was obtained.  PROCEDURE TECHNIQUE: After Xylocaine anesthesia a 36F slender sheath was placed in the right radial artery with a single anterior needle wall stick. IV Heparin was given. Right coronary angiography was done using a Judkins R4 guide catheter. Left coronary angiography was done using a Judkins L3.5 guide catheter. Left heart cath  was done using a JR4 catheter. A straight catheter was used to perform a pullback  across the right subclavian region. A TR band was used for hemostasis.  Contrast: 70 cc   Estimated blood loss <50 mL. There were no immediate complications during the procedure.    Coronary Findings    Dominance: Right   Left Main   . LM lesion, 50% stenosed.     Left Anterior Descending   . Ost LAD to Prox LAD lesion, 70% stenosed.   . Mid LAD lesion, 20% stenosed.     Left Circumflex   . Mid Cx to Dist Cx lesion, 90% stenosed.     Right Coronary Artery   . Ost RCA to Prox RCA lesion, 99% stenosed.   . Prox RCA to Mid RCA lesion, 100% stenosed.   . Right Posterior Descending Artery   RPDA filled by collaterals from Dist LAD.   . Right Posterior Atrioventricular Branch   Post Atrio filled by collaterals from Dist Cx.      Left Heart    Left Ventricle LVEDP 24 mm Hg  15 mm Hg across the right subclavian stenosis.   Aortic Valve There is no aortic valve stenosis.    Coronary Diagrams    Diagnostic Diagram            Implants    Name ID Temporary Type Supply   No information to display    PACS Images    Show images for Cardiac catheterization     Link to Procedure Log    Procedure Log      Hemo Data       Most Recent Value   AO Systolic Pressure  123456 mmHg   AO Diastolic Pressure  57 mmHg   AO Mean  [id number] mmHg   LV Systolic Pressure  99991111 mmHg   LV Diastolic Pressure  13 mmHg   LV EDP  24 mmHg   Arterial Occlusion Pressure Extended Systolic Pressure  [id number] mmHg   Arterial Occlusion Pressure Extended Diastolic Pressure  54 mmHg   Arterial Occlusion Pressure Extended Mean Pressure  104 mmHg   Left Ventricular Apex Extended Systolic Pressure  [id number] mmHg   Left Ventricular Apex Extended Diastolic Pressure  63 mmHg   Left Ventricular Apex Extended EDP Pressure  65 mmHg         Impression:  Patient has multivessel coronary artery disease  with preserved left ventricular systolic function. She presents with acute exacerbation of chronic shortness of breath and chest discomfort that are likely multifactorial but consistent with angina pectoris and diastolic congestive heart failure in the setting of hypertension and severe COPD with continued ongoing heavy tobacco abuse.  I have personally reviewed the patient's diagnostic cardiac catheterization performed earlier today and her most recent echocardiogram performed last September.  The patient has approximately 50% stenosis of the distal left main coronary artery and 60-70% ostial stenosis of the left anterior descending coronary artery. There is 100% chronic occlusion of the right coronary artery with left-to-right collateral filling of the terminal branches of the right coronary artery. Terminal branches of the distal right coronary artery appeared diffusely diseased and may or may not be adequate target vessels for surgical revascularization. The patient also has high-grade stenosis of the distal left circumflex coronary artery in the AV groove, but the terminal portions of the left circumflex coronary artery are clearly too small and diffusely diseased for grafting.  LV systolic function remains normal.  She likely has significant diastolic dysfunction.  Overall I suspect there would be some benefit associated with surgical revascularization,  although risks associated with coronary artery bypass grafting would clearly be high because of the patient's numerous comorbid medical problems.  The benefits of surgery may be somewhat muted because of her coronary anatomy.  Blood flow through the left main coronary artery and the left anterior descending coronary artery appears brisk, and whether or not these lesions are flow limiting remains unclear.  Nuclear stress test performed during this admission revealed only inferior wall ischemia.  Target vessels for grafting in the distal right coronary and  distal left circumflex territories appear relatively poor.    Plan:  I have reviewed the indications, risks, and potential benefits of coronary artery bypass grafting with the patient and her family.  Alternative treatment strategies have been discussed, including the relative risks, benefits and long term prognosis associated with medical therapy, percutaneous coronary intervention, and surgical revascularization.  The patient hopes to avoid proceeding with coronary artery bypass surgery if possible.   We will check formal pulmonary function tests to better stratify risks associated with surgery.  I have emphasized to the patient and her family that it is mandatory that she stop smoking permanently. We will review her case with Dr. Irish Lack and colleagues and continue to follow along to discuss options further.   I spent in excess of 120 minutes during the conduct of this hospital consultation and >50% of this time involved direct face-to-face encounter for counseling and/or coordination of the patient's care.    Valentina Gu. Roxy Manns, MD 01/23/2015 6:11 PM

## 2015-01-23 NOTE — Progress Notes (Signed)
PROGRESS NOTE  [name] [id number] DOB: 04-10-1950 DOA: 01/21/2015 PCP: [name], MD  HPI: 64 y.o. 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 Patient presents to the ED with c/o chest pain  Subjective / 24 H Interval events - denies chest pain this morning, awaiting cath   Assessment/Plan: Principal Problem:   Acute chest pain Active Problems:   Diabetes mellitus (HCC)   CKD (chronic kidney disease), stage III   Chronic diastolic CHF (congestive heart failure) (HCC)   Abnormal nuclear stress test Diabetes Mellitus    Acute chest pain  - cardiology was consulted and patient underwent stress test today with findings consistent with ischemia - cath this morning with multivessel disease, Ost RCA to Prox RCA lesion, 99% stenosed, Prox RCA to Mid RCA lesion, 100% stenosed, Ost LAD to Prox LAD lesion, 70% stenosed, LM lesion, 50% stenosed, Mid Cx to Dist Cx lesion, 90% stenosed. Recommending cardiac surgery consult.   Chronic diastolic CHF  - appears euvolemic, weight stable this morning  HTN - continue home medications - hypertensive initially however 129/53 post cath, continue current regimen   CKD III - Cr at baseline, improved overnight to 1.39 with gentle hydration - watch closely  DM  - diet controlled - CBGs within goal today, fasting 103  Recent scabies -  Under treatment, skin lesions improved significantly    Diet: Diet heart healthy/carb modified Room service appropriate?: Yes; Fluid consistency:: Thin Fluids: NS DVT Prophylaxis: heparin  Code Status: Full Code Family Communication: 3 family members bedside   Disposition Plan: home when ready   Barriers to discharge: cardiology evaluation, positive stress test   Consultants:  Cardiology   Procedures:  Stress test    Antibiotics  Anti-infectives    None       Studies  Dg Chest 2  View  01/21/2015  CLINICAL DATA:  Chest pain on the left side starting today. EXAM: CHEST  2 VIEW COMPARISON:  December 24, 2014 FINDINGS: The mediastinal contour is normal. The heart size is enlarged. There is diffuse increased pulmonary interstitium. There is no focal pneumonia or pleural effusion. The lungs are hyperinflated. No acute abnormalities identified within the visualized bones. There is scoliosis of spine. IMPRESSION: Mild interstitial edema. Electronically Signed   By: Abelardo Diesel M.D.   On: 01/21/2015 19:51   Nm Myocar Multi W/spect W/wall Motion / Ef  01/22/2015   There was no ST segment deviation noted during stress.  No T wave inversion was noted during stress.  Defect 1: There is a medium defect of moderate severity.  Findings consistent with ischemia.  Nuclear stress EF: 52%.  Moderate size and intensity, reversible inferior wall perfusion defect suggestive of ischemia (SDS 4). LVEF 52% with inferior hypokinesis. This is an intermediate risk study.   Objective  Filed Vitals:   01/23/15 0909 01/23/15 0914 01/23/15 0931 01/23/15 1011  BP:   129/53   Pulse: 0 0 66 54  Temp:   97.8 F (36.6 C)   TempSrc:   Oral   Resp: 0 8 20   Height:      Weight:      SpO2: 87% 0% 77%     Intake/Output Summary (Last 24 hours) at 01/23/15 1028 Last data filed at 01/23/15 1012  Gross per 24 hour  Intake 2122.2 ml  Output   1900 ml  Net  222.2 ml   Filed Weights   01/21/15  2352 01/22/15 0531 01/23/15 0436  Weight: 61.6 kg (135 lb 12.9 oz) 61.6 kg (135 lb 12.9 oz) 60.51 kg (133 lb 6.4 oz)   Exam:  GENERAL: NAD  HEENT: no scleral icterus, PERRL  NECK: supple, no LAD  LUNGS: CTA biL, no wheezing  HEART: RRR without MRG  ABDOMEN: soft, non tender  EXTREMITIES: no clubbing / cyanosis  NEUROLOGIC: non focal  Data Reviewed: Basic Metabolic Panel:  Recent Labs Lab 01/21/15 1951 01/23/15 0532  NA 138 141  K 4.1 3.7  CL 106 109  CO2 23 23  GLUCOSE 223* 107*   BUN 52* 42*  CREATININE 1.61* 1.39*  CALCIUM 9.7 8.7*   CBC:  Recent Labs Lab 01/21/15 1951 01/23/15 0532  WBC 8.0 7.1  HGB 10.3* 10.3*  HCT 32.7* 32.5*  MCV 91.9 91.5  PLT 319 288   Cardiac Enzymes:  Recent Labs Lab 01/22/15 0033 01/22/15 0503 01/22/15 0942  TROPONINI <0.03 <0.03 <0.03   BNP (last 3 results)  Recent Labs  11/07/14 0325 12/24/14 1030 01/21/15 2041  BNP 2634.2* 1288.9* 833.4*   CBG:  Recent Labs Lab 01/22/15 1105 01/22/15 1649 01/22/15 2121 01/23/15 0620  GLUCAP 148* 124* 136* 103*    Scheduled Meds: . amLODipine  10 mg Oral Daily  . [START ON 01/24/2015] aspirin EC  81 mg Oral Daily  . cloNIDine  0.1 mg Oral BID  . ferrous sulfate  325 mg Oral TID WC  . heparin  5,000 Units Subcutaneous 3 times per day  . metoprolol  50 mg Oral BID  . sodium chloride  3 mL Intravenous Q12H   Continuous Infusions: . sodium chloride 100 mL/hr at 01/23/15 1012    Marzetta Board, MD [facility] Pager [phone]. If 7 PM - 7 AM, please contact night-coverage at [email or web address], password [facility] 01/23/2015, 10:28 AM

## 2015-01-24 ENCOUNTER — Observation Stay (HOSPITAL_COMMUNITY): Payer: Medicaid Other

## 2015-01-24 ENCOUNTER — Encounter (HOSPITAL_COMMUNITY): Payer: Self-pay | Admitting: Thoracic Surgery (Cardiothoracic Vascular Surgery)

## 2015-01-24 DIAGNOSIS — E46 Unspecified protein-calorie malnutrition: Secondary | ICD-10-CM

## 2015-01-24 DIAGNOSIS — I5033 Acute on chronic diastolic (congestive) heart failure: Secondary | ICD-10-CM | POA: Diagnosis present

## 2015-01-24 HISTORY — DX: Unspecified protein-calorie malnutrition: E46

## 2015-01-24 LAB — BLOOD GAS, ARTERIAL
Acid-base deficit: 3.4 mmol/L — ABNORMAL HIGH (ref 0.0–2.0)
Bicarbonate: 20.5 meq/L (ref 20.0–24.0)
Drawn by: 364961
FIO2: 0.21
O2 Saturation: 89.2 %
Patient temperature: 98.6
TCO2: 21.5 mmol/L (ref 0–100)
pCO2 arterial: 32.8 mmHg — ABNORMAL LOW (ref 35.0–45.0)
pH, Arterial: 7.412 (ref 7.350–7.450)
pO2, Arterial: 58.6 mmHg — ABNORMAL LOW (ref 80.0–100.0)

## 2015-01-24 LAB — CBC
HEMATOCRIT: 32 % — AB (ref 36.0–46.0)
HEMOGLOBIN: 9.8 g/dL — AB (ref 12.0–15.0)
MCH: 28.2 pg (ref 26.0–34.0)
MCHC: 30.6 g/dL (ref 30.0–36.0)
MCV: 92.2 fL (ref 78.0–100.0)
Platelets: 291 10*3/uL (ref 150–400)
RBC: 3.47 MIL/uL — AB (ref 3.87–5.11)
RDW: 15.7 % — ABNORMAL HIGH (ref 11.5–15.5)
WBC: 7.5 10*3/uL (ref 4.0–10.5)

## 2015-01-24 LAB — COMPREHENSIVE METABOLIC PANEL WITH GFR
ALT: 9 U/L — ABNORMAL LOW (ref 14–54)
AST: 12 U/L — ABNORMAL LOW (ref 15–41)
Albumin: 2.3 g/dL — ABNORMAL LOW (ref 3.5–5.0)
Alkaline Phosphatase: 76 U/L (ref 38–126)
Anion gap: 8 (ref 5–15)
BUN: 42 mg/dL — ABNORMAL HIGH (ref 6–20)
CO2: 23 mmol/L (ref 22–32)
Calcium: 8.4 mg/dL — ABNORMAL LOW (ref 8.9–10.3)
Chloride: 110 mmol/L (ref 101–111)
Creatinine, Ser: 1.63 mg/dL — ABNORMAL HIGH (ref 0.44–1.00)
GFR calc Af Amer: 37 mL/min — ABNORMAL LOW (ref >60)
GFR calc non Af Amer: 32 mL/min — ABNORMAL LOW (ref >60)
Glucose, Bld: 138 mg/dL — ABNORMAL HIGH (ref 65–99)
Potassium: 4.2 mmol/L (ref 3.5–5.1)
Sodium: 141 mmol/L (ref 135–145)
Total Bilirubin: 0.6 mg/dL (ref 0.3–1.2)
Total Protein: 5.7 g/dL — ABNORMAL LOW (ref 6.5–8.1)

## 2015-01-24 LAB — LIPID PANEL
CHOL/HDL RATIO: 7.9 ratio
Cholesterol: 261 mg/dL — ABNORMAL HIGH (ref 0–200)
HDL: 33 mg/dL — AB (ref >40)
LDL CALC: 173 mg/dL — AB (ref 0–99)
TRIGLYCERIDES: 274 mg/dL — AB (ref <150)
VLDL: 55 mg/dL — AB (ref 0–40)

## 2015-01-24 LAB — GLUCOSE, CAPILLARY
GLUCOSE-CAPILLARY: 192 mg/dL — AB (ref 65–99)
GLUCOSE-CAPILLARY: 169 mg/dL — AB (ref 65–99)
Glucose-Capillary: 137 mg/dL — ABNORMAL HIGH (ref 65–99)
Glucose-Capillary: 175 mg/dL — ABNORMAL HIGH (ref 65–99)

## 2015-01-24 LAB — PROTIME-INR
INR: 1.1 (ref 0.00–1.49)
Prothrombin Time: 14.4 seconds (ref 11.6–15.2)

## 2015-01-24 LAB — APTT: APTT: 35 s (ref 24–37)

## 2015-01-24 LAB — PREALBUMIN: PREALBUMIN: 15.4 mg/dL — AB (ref 18–38)

## 2015-01-24 MED ORDER — FERROUS SULFATE 325 (65 FE) MG PO TABS
325.0000 mg | ORAL_TABLET | Freq: Three times a day (TID) | ORAL | Status: DC
  Administered 2015-01-25 – 2015-01-28 (×10): 325 mg via ORAL
  Filled 2015-01-24 (×10): qty 1

## 2015-01-24 NOTE — Progress Notes (Signed)
Subjective:  No chest pain today.  No shortness of breath.  Objective:  Vital Signs in the last 24 hours: BP 157/44 mmHg  Pulse 57  Temp(Src) 98.3 F (36.8 C) (Oral)  Resp 18  Ht 4\' 10"  (1.473 m)  Wt 60.873 kg (134 lb 3.2 oz)  BMI 28.06 kg/m2  SpO2 97%  Physical Exam: Obese pleasant female in no acute distress Lungs:  Clear Cardiac:  Regular rhythm, normal S1 and S2, no S3 Extremities:  No edema present, radial cath site is clean and dry  Intake/Output from previous day: 12/02 0701 - 12/03 0700 In: 2904.7 [P.O.:995; I.V.:1909.7] Out: 1600 [Urine:1600]  Weight Filed Weights   01/22/15 0531 01/23/15 0436 01/24/15 0539  Weight: 61.6 kg (135 lb 12.9 oz) 60.51 kg (133 lb 6.4 oz) 60.873 kg (134 lb 3.2 oz)    Lab Results: Basic Metabolic Panel:  Recent Labs  01/23/15 0532 01/24/15 0251  NA 141 141  K 3.7 4.2  CL 109 110  CO2 23 23  GLUCOSE 107* 138*  BUN 42* 42*  CREATININE 1.39* 1.63*   CBC:  Recent Labs  01/23/15 0532 01/24/15 0251  WBC 7.1 7.5  HGB 10.3* 9.8*  HCT 32.5* 32.0*  MCV 91.5 92.2  PLT 288 291   Cardiac Enzymes: Troponin (Point of Care Test)  Recent Labs  01/21/15 1935  TROPIPOC 0.01   Cardiac Panel (last 3 results)  Recent Labs  01/22/15 0033 01/22/15 0503 01/22/15 0942  TROPONINI <0.03 <0.03 <0.03    Telemetry: Sinus bradycardia  Assessment/Plan:  1.  Somewhat atypical chest pain but with three-vessel coronary artery disease and moderate left main disease noted on cath-Dr. Guy Sandifer note read 2.  Chronic diastolic heart failure 3.  Hypertensive heart disease 4.  Stage III chronic kidney disease  Recommendations:  We'll have cardiac rehabilitation see the patient.  The question is whether this should be managed medically or go on for bypass.  I will have her ambulate today and also need to talk with cardiac surgery.  It may be that she can be intensively medically managed until we can sort out what the best course of therapy  for her is.      Kerry Hough  MD [facility]  01/24/2015, 1:15 PM

## 2015-01-24 NOTE — Progress Notes (Signed)
[address]       [place] [address]             [phone]        CARDIOTHORACIC SURGERY PROGRESS NOTE   R1 Day Post-Op Procedure(s) (LRB): Left Heart Cath and Coronary Angiography (N/A)  Subjective: No chest pain.  Reports feeling better.  No SOB at rest  Objective: Vital signs: BP Readings from Last 1 Encounters:  01/24/15 157/44   Pulse Readings from Last 1 Encounters:  01/24/15 57   Resp Readings from Last 1 Encounters:  01/24/15 18   Temp Readings from Last 1 Encounters:  01/24/15 98.3 F (36.8 C) Oral    Hemodynamics:    Physical Exam:  Rhythm:   sinus  Breath sounds: Coarse breath sounds, no wheezes  Heart sounds:  RRR w/ systolic murmur  Incisions:  n/a  Abdomen:  soft  Extremities:  warm   Intake/Output from previous day: 12/02 0701 - 12/03 0700 In: 2904.7 [P.O.:995; I.V.:1909.7] Out: 1600 [Urine:1600] Intake/Output this shift: Total I/O In: 230 [P.O.:230] Out: -   Lab Results:  CBC: Recent Labs  01/23/15 0532 01/24/15 0251  WBC 7.1 7.5  HGB 10.3* 9.8*  HCT 32.5* 32.0*  PLT 288 291    BMET:  Recent Labs  01/23/15 0532 01/24/15 0251  NA 141 141  K 3.7 4.2  CL 109 110  CO2 23 23  GLUCOSE 107* 138*  BUN 42* 42*  CREATININE 1.39* 1.63*  CALCIUM 8.7* 8.4*     PT/INR:   Recent Labs  01/24/15 0251  LABPROT 14.4  INR 1.10    CBG (last 3)   Recent Labs  01/24/15 0601 01/24/15 1117 01/24/15 1618  GLUCAP 137* 175* 192*    ABG    Component Value Date/Time   PHART 7.412 01/24/2015 0400   PCO2ART 32.8* 01/24/2015 0400   PO2ART 58.6* 01/24/2015 0400   HCO3 20.5 01/24/2015 0400   TCO2 21.5 01/24/2015 0400   ACIDBASEDEF 3.4* 01/24/2015 0400   O2SAT 89.2 01/24/2015 0400    CXR: CHEST 2 VIEW  COMPARISON: December 24, 2014  FINDINGS: The mediastinal contour is normal. The heart size is enlarged. There is diffuse increased pulmonary interstitium. There is no focal pneumonia or pleural  effusion. The lungs are hyperinflated. No acute abnormalities identified within the visualized bones. There is scoliosis of spine.  IMPRESSION: Mild interstitial edema.   Electronically Signed  By: Abelardo Diesel M.D.  On: 01/21/2015 19:51    Transthoracic Echocardiography  Patient:  Neydi, Gehling MR #:    [id number] Study Date: 01/24/2015 Gender:   F Age:    44 Height:   147.3 cm Weight:   64.4 kg BSA:    1.65 m^2 Pt. Status: Room:    3E25C  ORDERING   Darylene Price, M.D. REFERRING  Darylene Price, M.D. ATTENDING  Noemi Chapel [id number] ADMITTING  [name] SONOGRAPHER Jimmy Reel, RDCS PERFORMING  [facility], Inpatient  cc:  ------------------------------------------------------------------- LV EF: 60% -  65%  ------------------------------------------------------------------- Indications:   CHF - 428.0.  ------------------------------------------------------------------- History:  PMH:  Chest pain. Coronary artery disease. Risk factors: Current tobacco use. Hypertension.  ------------------------------------------------------------------- Study Conclusions  - Left ventricle: The cavity size was normal. There was mild concentric hypertrophy. Systolic function was normal. The estimated ejection fraction was in the range of 60% to 65%. Wall motion was normal; there were no regional wall motion abnormalities. Features are consistent with a pseudonormal left ventricular filling pattern,  with concomitant abnormal relaxation and increased filling pressure (grade 2 diastolic dysfunction). - Mitral valve: Mildly to moderately calcified annulus. There was moderate regurgitation. Valve area by pressure half-time: 1.16 cm^2. Valve area by continuity equation (using LVOT flow): 1.69 cm^2. - Left atrium: The atrium was mildly dilated. - Right atrium: The atrium was mildly dilated. - Pulmonary  arteries: Systolic pressure was mildly increased. PA peak pressure: 40 mm Hg (S).  Transthoracic echocardiography. M-mode, complete 2D, spectral Doppler, and color Doppler. Birthdate: Patient birthdate: 07-03-1950. Age: Patient is 64 yr old. Sex: Gender: female. BMI: 29.7 kg/m^2. Blood pressure:   182/68 Patient status: Inpatient. Study date: Study date: 01/24/2015. Study time: 03:40 PM. Location: Bedside.  -------------------------------------------------------------------  ------------------------------------------------------------------- Left ventricle: The cavity size was normal. There was mild concentric hypertrophy. Systolic function was normal. The estimated ejection fraction was in the range of 60% to 65%. Wall motion was normal; there were no regional wall motion abnormalities. Features are consistent with a pseudonormal left ventricular filling pattern, with concomitant abnormal relaxation and increased filling pressure (grade 2 diastolic dysfunction).  ------------------------------------------------------------------- Aortic valve:  Trileaflet; normal thickness leaflets. Mobility was not restricted. Doppler: Transvalvular velocity was within the normal range. There was no stenosis. There was no regurgitation.  ------------------------------------------------------------------- Aorta: Aortic root: The aortic root was normal in size.  ------------------------------------------------------------------- Mitral valve:  Mildly to moderately calcified annulus. Mobility was not restricted. Doppler: Transvalvular velocity was within the normal range. There was no evidence for stenosis. There was moderate regurgitation.  Valve area by pressure half-time: 1.16 cm^2. Indexed valve area by pressure half-time: 0.7 cm^2/m^2. Valve area by continuity equation (using LVOT flow): 1.69 cm^2. Indexed valve area by continuity equation (using LVOT flow): 1.02  cm^2/m^2.  Mean gradient (D): 4 mm Hg. Peak gradient (D): 12 mm Hg.  ------------------------------------------------------------------- Left atrium: The atrium was mildly dilated.  ------------------------------------------------------------------- Right ventricle: The cavity size was normal. Wall thickness was normal. Systolic function was normal.  ------------------------------------------------------------------- Pulmonic valve:  Not well visualized. Doppler: Transvalvular velocity was within the normal range. There was no evidence for stenosis.  ------------------------------------------------------------------- Tricuspid valve:  Structurally normal valve.  Doppler: Transvalvular velocity was within the normal range. There was mild regurgitation.  ------------------------------------------------------------------- Pulmonary artery:  The main pulmonary artery was normal-sized. Systolic pressure was mildly increased.  ------------------------------------------------------------------- Right atrium: The atrium was mildly dilated.  ------------------------------------------------------------------- Pericardium: There was no pericardial effusion.  ------------------------------------------------------------------- Systemic veins: Inferior vena cava: The vessel was dilated. The respirophasic diameter changes were blunted (< 50%).  ------------------------------------------------------------------- Measurements  Left ventricle              Value     Reference LV ID, ED, PLAX chordal      (L)  42.9 mm    43 - 52 LV ID, ES, PLAX chordal          30.4 mm    23 - 38 LV fx shortening, PLAX chordal      29  %    >=29 LV PW thickness, ED            10.3 mm    --------- IVS/LV PW ratio, ED            1.08      <=1.3 Stroke volume, 2D             107  ml     --------- Stroke volume/bsa, 2D           65  ml/m^2  --------- LV ejection fraction,  1-p A4C       64  %    --------- LV end-diastolic volume, 2-p       97  ml    --------- LV end-systolic volume, 2-p        36  ml    --------- LV ejection fraction, 2-p         63  %    --------- Stroke volume, 2-p            61  ml    --------- LV end-diastolic volume/bsa, 2-p     59  ml/m^2  --------- LV end-systolic volume/bsa, 2-p      22  ml/m^2  --------- Stroke volume/bsa, 2-p          37.1 ml/m^2  --------- LV e&', lateral              5.35 cm/s   --------- LV E/e&', lateral             32.71     --------- LV s&', lateral              6.3  cm/s   --------- LV e&', medial               4.04 cm/s   --------- LV E/e&', medial              43.32     --------- LV e&', average              4.7  cm/s   --------- LV E/e&', average             37.27     --------- Longitudinal strain, TDI         20  %    ---------  Ventricular septum            Value     Reference IVS thickness, ED             11.1 mm    ---------  LVOT                   Value     Reference LVOT ID, S                19  mm    --------- LVOT area                 2.84 cm^2   --------- LVOT peak velocity, S           124  cm/s   --------- LVOT mean velocity, S           90.4 cm/s   --------- LVOT VTI, S                37.8 cm    --------- LVOT peak gradient, S           6   mm Hg  ---------  Aorta                   Value     Reference Aortic root ID, ED            24  mm     ---------  Left atrium                Value     Reference LA ID, A-P, ES              42  mm    --------- LA ID/bsa,  A-P           (H)  2.55 cm/m^2  <=2.2 LA volume, ES, 1-p A2C          51.7 ml    --------- LA volume/bsa, ES, 1-p A2C        31.3 ml/m^2  ---------  Mitral valve               Value     Reference Mitral E-wave peak velocity        175  cm/s   --------- Mitral A-wave peak velocity        96.7 cm/s   --------- Mitral mean velocity, D          88  cm/s   --------- Mitral deceleration time      (H)  602  ms    150 - 230 Mitral pressure half-time         189  ms    --------- Mitral mean gradient, D          4   mm Hg  --------- Mitral peak gradient, D          12  mm Hg  --------- Mitral E/A ratio, peak          1.8      --------- Mitral valve area, PHT, DP        1.16 cm^2   --------- Mitral valve area/bsa, PHT, DP      0.7  cm^2/m^2 --------- Mitral valve area, LVOT continuity    1.69 cm^2   --------- Mitral valve area/bsa, LVOT        1.02 cm^2/m^2 --------- continuity Mitral annulus VTI, D           63.4 cm    ---------  Pulmonary arteries            Value     Reference PA pressure, S, DP         (H)  40  mm Hg  <=30  Tricuspid valve              Value     Reference Tricuspid regurg peak velocity      282  cm/s   --------- Tricuspid peak RV-RA gradient       32  mm Hg  ---------  Systemic veins              Value     Reference Estimated CVP               8   mm Hg  ---------  Right ventricle              Value     Reference TAPSE                   19.8 mm     --------- RV pressure, S, DP         (H)  40  mm Hg  <=30 RV s&', lateral, S             10  cm/s   ---------  Legend: (L) and (H) mark values outside specified reference range.  ------------------------------------------------------------------- Prepared and Electronically Authenticated by  Ezzard Standing, MD [facility] 2016-12-03T17:06:28  Assessment/Plan: S/P Procedure(s) (LRB): Left Heart Cath and Coronary Angiography (N/A)  I have personally reviewed the patient's echocardiogram performed earlier today.  She has normal LV systolic function, mild LVH and moderate (2+) mitral regurgitation.  ABG notable for baseline hypoxia with  PaO2 59 and O2 sat 89% on room air.  Await PFT's which remain pending.  Cath films and case reviewed with Dr. Irish Lack and Dr. Burt Knack.  Options include either another attempt at medical therapy or proceeding w/ high risk CABG with or without repeat cath with FFR analysis to confirm the presence of hemodynamically significant stenosis in the left main and proximal LAD.    Mrs. Nunley' family is not currently present.  She remains reluctant to consider surgery, although she seems a bit more agreeable today than she did yesterday.   I will follow up again on Monday.     I spent in excess of 15 minutes during the conduct of this hospital encounter and >50% of this time involved direct face-to-face encounter with the patient for counseling and/or coordination of their care.   Rexene Alberts, MD 01/24/2015 5:25 PM

## 2015-01-24 NOTE — Progress Notes (Signed)
Patient currently refusing bed alarm. Educated patient on the purpose of alarm for safety reasons, still refused. Will continue to monitor and offer bathroom assistance when rounding.  Tresa Endo

## 2015-01-24 NOTE — Progress Notes (Addendum)
PATIENT HEART RATE SUSTAINING IN THE 50'S. QHS METOPROLOL HELD FOR NOW PER PARAMETERS SET BY MD, WILL CONTINUE TO MONITOR. [name]

## 2015-01-24 NOTE — Progress Notes (Signed)
CARDIAC REHAB PHASE I   PRE:  Rate/Rhythm: 52 SB  BP:  Supine: 153/71  Sitting:   Standing:    SaO2: 95%RA  MODE:  Ambulation: 400 ft   POST:  Rate/Rhythm: 86 SR  BP:  Supine:   Sitting: 160/43  Standing:    SaO2: 93%RA 1345-1425 Pt walked 400 ft with steady gait. Stopped 4 times to rest due to SOB but no CP.  Sats good on RA. Discussed smoking cessation with pt and gave handout and fake cigarette. Pt stated she might try nicotine patches but is really going to try to quit.    Graylon Good, RN BSN  01/24/2015 2:23 PM

## 2015-01-24 NOTE — Progress Notes (Signed)
Echocardiogram 2D Echocardiogram has been performed.  Joelene Millin 01/24/2015, 4:41 PM

## 2015-01-24 NOTE — Progress Notes (Signed)
PROGRESS NOTE  [name] [id number] DOB: [birth year] DOA: 01/21/2015 PCP: [name], MD  HPI: 64 y.o. 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 Patient presents to the ED with c/o chest pain  Subjective / 24 H Interval events - feels good this morning, confirms to me that she doesn't want surgery just yet, prefers to wait until Holidays are over   Assessment/Plan: Principal Problem:   Acute chest pain Active Problems:   Diabetes mellitus ([place])   CKD (chronic kidney disease), stage III   Chronic diastolic CHF (congestive heart failure) (HCC)   Abnormal nuclear stress test   Protein-calorie malnutrition ([place])   Acute on chronic diastolic heart failure (HCC) Diabetes Mellitus    Acute chest pain  - cardiology was consulted and patient underwent stress test 12/1 with findings consistent with ischemia - cath 12/2 with multivessel disease, Ost RCA to Prox RCA lesion, 99% stenosed, Prox RCA to Mid RCA lesion, 100% stenosed, Ost LAD to Prox LAD lesion, 70% stenosed, LM lesion, 50% stenosed, Mid Cx to Dist Cx lesion, 90% stenosed. - TCV consulted, patient not sure whether she wants surgery just now, would like to wait after holidays   Chronic diastolic CHF  - appears euvolemic, weight stable this morning  HTN - continue home medications  CKD III - Cr at baseline, improved overnight to 1.39 with gentle hydration - watch closely  DM  - diet controlled - CBGs within goal today, fasting 103  Recent scabies -  Under treatment, skin lesions improved significantly    Diet: Diet heart healthy/carb modified Room service appropriate?: Yes; Fluid consistency:: Thin Fluids: NS DVT Prophylaxis: heparin  Code Status: Full Code Family Communication: 3 family members bedside   Disposition Plan: home when ready   Barriers to discharge: cardiology/TCV evaluation  Consultants:  Cardiology     TCV  Procedures:  Stress test   Cardiac cath    Antibiotics  Anti-infectives    None       Studies  No results found. Objective  Filed Vitals:   01/23/15 2145 01/24/15 0539 01/24/15 1119 01/24/15 1159  BP:  164/77 157/44   Pulse: 64 65 51 57  Temp:  98.2 F (36.8 C) 98.3 F (36.8 C)   TempSrc:  Oral Oral   Resp:  18    Height:      Weight:  60.873 kg (134 lb 3.2 oz)    SpO2:  93% 97%     Intake/Output Summary (Last 24 hours) at 01/24/15 1401 Last data filed at 01/24/15 1359  Gross per 24 hour  Intake 2659.72 ml  Output    850 ml  Net 1809.72 ml   Filed Weights   01/22/15 0531 01/23/15 0436 01/24/15 0539  Weight: 61.6 kg (135 lb 12.9 oz) 60.51 kg (133 lb 6.4 oz) 60.873 kg (134 lb 3.2 oz)   Exam:  GENERAL: NAD  HEENT: no scleral icterus, PERRL  NECK: supple, no LAD  LUNGS: CTA biL, no wheezing  HEART: RRR without MRG  ABDOMEN: soft, non tender  EXTREMITIES: no clubbing / cyanosis  NEUROLOGIC: non focal  Data Reviewed: Basic Metabolic Panel:  Recent Labs Lab 01/21/15 1951 01/23/15 0532 01/24/15 0251  NA 138 141 141  K 4.1 3.7 4.2  CL 106 109 110  CO2 23 23 23   GLUCOSE 223* 107* 138*  BUN 52* 42* 42*  CREATININE 1.61* 1.39* 1.63*  CALCIUM 9.7 8.7* 8.4*  CBC:  Recent Labs Lab 01/21/15 1951 01/23/15 0532 01/24/15 0251  WBC 8.0 7.1 7.5  HGB 10.3* 10.3* 9.8*  HCT 32.7* 32.5* 32.0*  MCV 91.9 91.5 92.2  PLT 319 288 291   Cardiac Enzymes:  Recent Labs Lab 01/22/15 0033 01/22/15 0503 01/22/15 0942  TROPONINI <0.03 <0.03 <0.03   BNP (last 3 results)  Recent Labs  11/07/14 0325 12/24/14 1030 01/21/15 2041  BNP 2634.2* 1288.9* 833.4*   CBG:  Recent Labs Lab 01/23/15 1131 01/23/15 1636 01/23/15 2057 01/24/15 0601 01/24/15 1117  GLUCAP 124* 145* 170* 137* 175*    Scheduled Meds: . amLODipine  10 mg Oral Daily  . aspirin EC  81 mg Oral Daily  . cloNIDine  0.1 mg Oral BID  . ferrous sulfate  325 mg Oral  TID WC  . heparin  5,000 Units Subcutaneous 3 times per day  . metoprolol  50 mg Oral BID  . sodium chloride  3 mL Intravenous Q12H   Continuous Infusions:    Marzetta Board, MD [facility] Pager [phone]. If 7 PM - 7 AM, please contact night-coverage at [email or web address], password [facility] 01/24/2015, 2:01 PM

## 2015-01-25 DIAGNOSIS — F0390 Unspecified dementia without behavioral disturbance: Secondary | ICD-10-CM | POA: Diagnosis present

## 2015-01-25 DIAGNOSIS — B86 Scabies: Secondary | ICD-10-CM | POA: Diagnosis present

## 2015-01-25 DIAGNOSIS — J449 Chronic obstructive pulmonary disease, unspecified: Secondary | ICD-10-CM | POA: Diagnosis present

## 2015-01-25 DIAGNOSIS — I25110 Atherosclerotic heart disease of native coronary artery with unstable angina pectoris: Secondary | ICD-10-CM | POA: Diagnosis present

## 2015-01-25 DIAGNOSIS — T508X5A Adverse effect of diagnostic agents, initial encounter: Secondary | ICD-10-CM | POA: Diagnosis not present

## 2015-01-25 DIAGNOSIS — I160 Hypertensive urgency: Secondary | ICD-10-CM | POA: Diagnosis present

## 2015-01-25 DIAGNOSIS — N141 Nephropathy induced by other drugs, medicaments and biological substances: Secondary | ICD-10-CM | POA: Diagnosis not present

## 2015-01-25 DIAGNOSIS — R0789 Other chest pain: Secondary | ICD-10-CM | POA: Diagnosis present

## 2015-01-25 DIAGNOSIS — N183 Chronic kidney disease, stage 3 (moderate): Secondary | ICD-10-CM | POA: Diagnosis present

## 2015-01-25 DIAGNOSIS — Z6829 Body mass index (BMI) 29.0-29.9, adult: Secondary | ICD-10-CM | POA: Diagnosis not present

## 2015-01-25 DIAGNOSIS — R0902 Hypoxemia: Secondary | ICD-10-CM | POA: Diagnosis present

## 2015-01-25 DIAGNOSIS — R001 Bradycardia, unspecified: Secondary | ICD-10-CM | POA: Diagnosis present

## 2015-01-25 DIAGNOSIS — E875 Hyperkalemia: Secondary | ICD-10-CM | POA: Diagnosis not present

## 2015-01-25 DIAGNOSIS — D509 Iron deficiency anemia, unspecified: Secondary | ICD-10-CM | POA: Diagnosis present

## 2015-01-25 DIAGNOSIS — E11319 Type 2 diabetes mellitus with unspecified diabetic retinopathy without macular edema: Secondary | ICD-10-CM | POA: Diagnosis present

## 2015-01-25 DIAGNOSIS — N178 Other acute kidney failure: Secondary | ICD-10-CM | POA: Diagnosis not present

## 2015-01-25 DIAGNOSIS — I130 Hypertensive heart and chronic kidney disease with heart failure and stage 1 through stage 4 chronic kidney disease, or unspecified chronic kidney disease: Secondary | ICD-10-CM | POA: Diagnosis present

## 2015-01-25 DIAGNOSIS — E669 Obesity, unspecified: Secondary | ICD-10-CM | POA: Diagnosis present

## 2015-01-25 DIAGNOSIS — I5033 Acute on chronic diastolic (congestive) heart failure: Secondary | ICD-10-CM | POA: Diagnosis present

## 2015-01-25 DIAGNOSIS — I081 Rheumatic disorders of both mitral and tricuspid valves: Secondary | ICD-10-CM | POA: Diagnosis present

## 2015-01-25 DIAGNOSIS — D638 Anemia in other chronic diseases classified elsewhere: Secondary | ICD-10-CM | POA: Diagnosis present

## 2015-01-25 DIAGNOSIS — I2582 Chronic total occlusion of coronary artery: Secondary | ICD-10-CM | POA: Diagnosis present

## 2015-01-25 DIAGNOSIS — Z9981 Dependence on supplemental oxygen: Secondary | ICD-10-CM | POA: Diagnosis not present

## 2015-01-25 DIAGNOSIS — E1151 Type 2 diabetes mellitus with diabetic peripheral angiopathy without gangrene: Secondary | ICD-10-CM | POA: Diagnosis present

## 2015-01-25 DIAGNOSIS — E1122 Type 2 diabetes mellitus with diabetic chronic kidney disease: Secondary | ICD-10-CM | POA: Diagnosis present

## 2015-01-25 LAB — BASIC METABOLIC PANEL
ANION GAP: 8 (ref 5–15)
BUN: 49 mg/dL — ABNORMAL HIGH (ref 6–20)
CALCIUM: 8.4 mg/dL — AB (ref 8.9–10.3)
CO2: 21 mmol/L — AB (ref 22–32)
Chloride: 110 mmol/L (ref 101–111)
Creatinine, Ser: 1.89 mg/dL — ABNORMAL HIGH (ref 0.44–1.00)
GFR calc Af Amer: 31 mL/min — ABNORMAL LOW (ref >60)
GFR calc non Af Amer: 27 mL/min — ABNORMAL LOW (ref >60)
GLUCOSE: 175 mg/dL — AB (ref 65–99)
Potassium: 3.9 mmol/L (ref 3.5–5.1)
Sodium: 139 mmol/L (ref 135–145)

## 2015-01-25 LAB — CBC
HEMATOCRIT: 30.5 % — AB (ref 36.0–46.0)
Hemoglobin: 9.7 g/dL — ABNORMAL LOW (ref 12.0–15.0)
MCH: 29.2 pg (ref 26.0–34.0)
MCHC: 31.8 g/dL (ref 30.0–36.0)
MCV: 91.9 fL (ref 78.0–100.0)
Platelets: 288 10*3/uL (ref 150–400)
RBC: 3.32 MIL/uL — ABNORMAL LOW (ref 3.87–5.11)
RDW: 15.6 % — AB (ref 11.5–15.5)
WBC: 7.2 10*3/uL (ref 4.0–10.5)

## 2015-01-25 LAB — GLUCOSE, CAPILLARY
GLUCOSE-CAPILLARY: 224 mg/dL — AB (ref 65–99)
Glucose-Capillary: 160 mg/dL — ABNORMAL HIGH (ref 65–99)
Glucose-Capillary: 174 mg/dL — ABNORMAL HIGH (ref 65–99)
Glucose-Capillary: 195 mg/dL — ABNORMAL HIGH (ref 65–99)

## 2015-01-25 MED ORDER — SODIUM CHLORIDE 0.9 % IV SOLN
INTRAVENOUS | Status: AC
  Administered 2015-01-25: 1000 mL via INTRAVENOUS

## 2015-01-25 NOTE — Progress Notes (Signed)
PROGRESS NOTE  [name] [id number] DOB: [birth year] DOA: 01/21/2015 PCP: [name], MD  HPI: 64 y.o. 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 Patient presents to the ED with c/o chest pain  Subjective / 24 H Interval events - no chest pain, no dyspnea. Ambulated yesterday  Assessment/Plan: Principal Problem:   Acute chest pain Active Problems:   Diabetes mellitus (HCC)   CKD (chronic kidney disease), stage III   Chronic diastolic CHF (congestive heart failure) (HCC)   Abnormal nuclear stress test   Protein-calorie malnutrition (HCC)   Acute on chronic diastolic heart failure (HCC) Diabetes Mellitus    Acute chest pain  - cardiology was consulted and patient underwent stress test 12/1 with findings consistent with ischemia - cath 12/2 with multivessel disease, Ost RCA to Prox RCA lesion, 99% stenosed, Prox RCA to Mid RCA lesion, 100% stenosed, Ost LAD to Prox LAD lesion, 70% stenosed, LM lesion, 50% stenosed, Mid Cx to Dist Cx lesion, 90% stenosed. - TCV consulted, patient not sure whether she wants surgery just now - to discuss again with surgery tomorrow  Chronic diastolic CHF  - appears euvolemic, weight stable this morning  HTN - continue home medications  AKI on CKD III - Cr slightly worse today, ?related to cath, gentle hydration for 75 cc/h for 7 hours  - recheck BMP in am   DM  - diet controlled - CBGs within goal, < 180  Recent scabies -  Under treatment, skin lesions improved significantly    Diet: Diet heart healthy/carb modified Room service appropriate?: Yes; Fluid consistency:: Thin Fluids: NS DVT Prophylaxis: heparin  Code Status: Full Code Family Communication: no family bedside Disposition Plan: home when ready   Barriers to discharge: cardiology/TCV evaluation  Consultants:  Cardiology   TCV  Procedures:  Stress test   Cardiac cath     Antibiotics  Anti-infectives    None       Studies  No results found. Objective  Filed Vitals:   01/24/15 2153 01/25/15 0446 01/25/15 0612 01/25/15 0930  BP:   102/64 166/88  Pulse: 56  60   Temp:   98.2 F (36.8 C)   TempSrc:   Oral   Resp:   18   Height:      Weight:  61.508 kg (135 lb 9.6 oz)    SpO2:   98%     Intake/Output Summary (Last 24 hours) at 01/25/15 1127 Last data filed at 01/24/15 2154  Gross per 24 hour  Intake    580 ml  Output    750 ml  Net   -170 ml   Filed Weights   01/23/15 0436 01/24/15 0539 01/25/15 0446  Weight: 60.51 kg (133 lb 6.4 oz) 60.873 kg (134 lb 3.2 oz) 61.508 kg (135 lb 9.6 oz)   Exam:  GENERAL: NAD  HEENT: no scleral icterus, PERRL  NECK: supple, no LAD  LUNGS: CTA biL, no wheezing  HEART: RRR without MRG  ABDOMEN: soft, non tender   Data Reviewed: Basic Metabolic Panel:  Recent Labs Lab 01/21/15 1951 01/23/15 0532 01/24/15 0251 01/25/15 0255  NA 138 141 141 139  K 4.1 3.7 4.2 3.9  CL 106 109 110 110  CO2 23 23 23  21*  GLUCOSE 223* 107* 138* 175*  BUN 52* 42* 42* 49*  CREATININE 1.61* 1.39* 1.63* 1.89*  CALCIUM 9.7 8.7* 8.4* 8.4*   CBC:  Recent Labs Lab  01/21/15 1951 01/23/15 0532 01/24/15 0251 01/25/15 0255  WBC 8.0 7.1 7.5 7.2  HGB 10.3* 10.3* 9.8* 9.7*  HCT 32.7* 32.5* 32.0* 30.5*  MCV 91.9 91.5 92.2 91.9  PLT 319 288 291 288   Cardiac Enzymes:  Recent Labs Lab 01/22/15 0033 01/22/15 0503 01/22/15 0942  TROPONINI <0.03 <0.03 <0.03   BNP (last 3 results)  Recent Labs  11/07/14 0325 12/24/14 1030 01/21/15 2041  BNP 2634.2* 1288.9* 833.4*   CBG:  Recent Labs Lab 01/24/15 0601 01/24/15 1117 01/24/15 1618 01/24/15 2140 01/25/15 0617  GLUCAP 137* 175* 192* 169* 160*    Scheduled Meds: . amLODipine  10 mg Oral Daily  . aspirin EC  81 mg Oral Daily  . cloNIDine  0.1 mg Oral BID  . ferrous sulfate  325 mg Oral TID WC  . heparin  5,000 Units Subcutaneous 3 times  per day  . metoprolol  50 mg Oral BID  . sodium chloride  3 mL Intravenous Q12H   Continuous Infusions: . sodium chloride      Marzetta Board, MD [facility] Pager [phone]. If 7 PM - 7 AM, please contact night-coverage at [email or web address], password [facility] 01/25/2015, 11:27 AM

## 2015-01-25 NOTE — Progress Notes (Signed)
Subjective:  No chest pain today.  No shortness of breath.  Objective:  Vital Signs in the last 24 hours: BP 166/88 mmHg  Pulse 60  Temp(Src) 98.2 F (36.8 C) (Oral)  Resp 18  Ht 4\' 10"  (1.473 m)  Wt 61.508 kg (135 lb 9.6 oz)  BMI 28.35 kg/m2  SpO2 98%  Physical Exam: Obese pleasant female in no acute distress Lungs:  Clear Cardiac:  Regular rhythm, normal S1 and S2, no S3 Extremities:  No edema present,   Intake/Output from previous day: 12/03 0701 - 12/04 0700 In: 700 [P.O.:700] Out: 750 [Urine:750]  Weight Filed Weights   01/23/15 0436 01/24/15 0539 01/25/15 0446  Weight: 60.51 kg (133 lb 6.4 oz) 60.873 kg (134 lb 3.2 oz) 61.508 kg (135 lb 9.6 oz)    Lab Results: Basic Metabolic Panel:  Recent Labs  01/24/15 0251 01/25/15 0255  NA 141 139  K 4.2 3.9  CL 110 110  CO2 23 21*  GLUCOSE 138* 175*  BUN 42* 49*  CREATININE 1.63* 1.89*   CBC:  Recent Labs  01/24/15 0251 01/25/15 0255  WBC 7.5 7.2  HGB 9.8* 9.7*  HCT 32.0* 30.5*  MCV 92.2 91.9  PLT 291 288   Telemetry: Sinus bradycardia  Assessment/Plan:  1.  Somewhat atypical chest pain but with three-vessel coronary artery disease and moderate left main disease noted on cath-plan is for either for intensified medical therapy or high risk CABG according to notes.  No recurrent pain on ambulation with rehabilitation. 2.  Chronic diastolic heart failure 3.  Hypertensive heart disease 4.  Stage III chronic kidney disease some worsening today  Recommendations:  She would prefer to defer any sort of surgery until after the holidays if it is necessary.  May be most reasonable to plan intensive medical therapy and reserve high risk CABG for failure to achieve good symptom relief with medical therapy.  Renal function is somewhat worse today and plan is to receive fluids.  Follow-up again in the morning.      Kerry Hough  MD [facility] Cardiology  01/25/2015, 10:14 AM

## 2015-01-26 ENCOUNTER — Inpatient Hospital Stay (HOSPITAL_COMMUNITY): Payer: Medicaid Other

## 2015-01-26 ENCOUNTER — Inpatient Hospital Stay (HOSPITAL_COMMUNITY): Payer: Self-pay

## 2015-01-26 DIAGNOSIS — N179 Acute kidney failure, unspecified: Secondary | ICD-10-CM | POA: Insufficient documentation

## 2015-01-26 DIAGNOSIS — I5033 Acute on chronic diastolic (congestive) heart failure: Secondary | ICD-10-CM

## 2015-01-26 DIAGNOSIS — R931 Abnormal findings on diagnostic imaging of heart and coronary circulation: Secondary | ICD-10-CM

## 2015-01-26 DIAGNOSIS — N183 Chronic kidney disease, stage 3 (moderate): Secondary | ICD-10-CM

## 2015-01-26 LAB — GLUCOSE, CAPILLARY
GLUCOSE-CAPILLARY: 193 mg/dL — AB (ref 65–99)
GLUCOSE-CAPILLARY: 179 mg/dL — AB (ref 65–99)
Glucose-Capillary: 154 mg/dL — ABNORMAL HIGH (ref 65–99)
Glucose-Capillary: 190 mg/dL — ABNORMAL HIGH (ref 65–99)

## 2015-01-26 LAB — PULMONARY FUNCTION TEST
DL/VA % pred: 60 %
DL/VA: 2.24 ml/min/mmHg/L
DLCO UNC % PRED: 21 %
DLCO unc: 3.15 ml/min/mmHg
FEF 25-75 PRE: 1.13 L/s
FEF 25-75 Post: 1.29 L/sec
FEF2575-%Change-Post: 13 %
FEF2575-%PRED-PRE: 64 %
FEF2575-%Pred-Post: 73 %
FEV1-%Change-Post: 3 %
FEV1-%PRED-POST: 70 %
FEV1-%Pred-Pre: 67 %
FEV1-POST: 1.27 L
FEV1-Pre: 1.22 L
FEV1FVC-%CHANGE-POST: 2 %
FEV1FVC-%Pred-Pre: 105 %
FEV6-%CHANGE-POST: 1 %
FEV6-%PRED-PRE: 66 %
FEV6-%Pred-Post: 67 %
FEV6-POST: 1.53 L
FEV6-PRE: 1.51 L
FEV6FVC-%PRED-POST: 104 %
FEV6FVC-%PRED-PRE: 104 %
FVC-%CHANGE-POST: 1 %
FVC-%PRED-PRE: 63 %
FVC-%Pred-Post: 64 %
FVC-Post: 1.53 L
FVC-Pre: 1.51 L
POST FEV6/FVC RATIO: 100 %
PRE FEV1/FVC RATIO: 81 %
PRE FEV6/FVC RATIO: 100 %
Post FEV1/FVC ratio: 83 %
RV % PRED: 107 %
RV: 1.86 L
TLC % PRED: 82 %
TLC: 3.33 L

## 2015-01-26 LAB — CBC
HEMATOCRIT: 28.1 % — AB (ref 36.0–46.0)
Hemoglobin: 9 g/dL — ABNORMAL LOW (ref 12.0–15.0)
MCH: 29.5 pg (ref 26.0–34.0)
MCHC: 32 g/dL (ref 30.0–36.0)
MCV: 92.1 fL (ref 78.0–100.0)
PLATELETS: 236 10*3/uL (ref 150–400)
RBC: 3.05 MIL/uL — ABNORMAL LOW (ref 3.87–5.11)
RDW: 15.6 % — AB (ref 11.5–15.5)
WBC: 7.4 10*3/uL (ref 4.0–10.5)

## 2015-01-26 LAB — HEMOGLOBIN A1C
HEMOGLOBIN A1C: 6.2 % — AB (ref 4.8–5.6)
MEAN PLASMA GLUCOSE: 131 mg/dL

## 2015-01-26 LAB — BASIC METABOLIC PANEL
Anion gap: 6 (ref 5–15)
BUN: 50 mg/dL — AB (ref 6–20)
CHLORIDE: 111 mmol/L (ref 101–111)
CO2: 22 mmol/L (ref 22–32)
CREATININE: 1.96 mg/dL — AB (ref 0.44–1.00)
Calcium: 8.2 mg/dL — ABNORMAL LOW (ref 8.9–10.3)
GFR calc Af Amer: 30 mL/min — ABNORMAL LOW (ref >60)
GFR calc non Af Amer: 26 mL/min — ABNORMAL LOW (ref >60)
GLUCOSE: 162 mg/dL — AB (ref 65–99)
POTASSIUM: 4 mmol/L (ref 3.5–5.1)
Sodium: 139 mmol/L (ref 135–145)

## 2015-01-26 LAB — TYPE AND SCREEN
ABO/RH(D): O POS
Antibody Screen: NEGATIVE

## 2015-01-26 MED ORDER — ALBUTEROL SULFATE (2.5 MG/3ML) 0.083% IN NEBU
2.5000 mg | INHALATION_SOLUTION | Freq: Once | RESPIRATORY_TRACT | Status: AC
  Administered 2015-01-26: 2.5 mg via RESPIRATORY_TRACT

## 2015-01-26 MED ORDER — ATORVASTATIN CALCIUM 80 MG PO TABS
80.0000 mg | ORAL_TABLET | Freq: Every day | ORAL | Status: DC
  Administered 2015-01-26 – 2015-01-27 (×2): 80 mg via ORAL
  Filled 2015-01-26 (×2): qty 1

## 2015-01-26 MED ORDER — CARVEDILOL 25 MG PO TABS
25.0000 mg | ORAL_TABLET | Freq: Two times a day (BID) | ORAL | Status: DC
  Administered 2015-01-26 – 2015-01-28 (×4): 25 mg via ORAL
  Filled 2015-01-26 (×4): qty 1

## 2015-01-26 NOTE — Progress Notes (Signed)
PROGRESS NOTE  KIMBREE KOELZER [id number] DOB: 06-01-1950 DOA: 01/21/2015 PCP: [name], MD  HPI: 64 y.o. 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 Patient presents to the ED with c/o chest pain  Subjective / 24 H Interval events - no chest pain, no dyspnea. Ambulated yesterday without problems  Assessment/Plan: Principal Problem:   Acute chest pain Active Problems:   Diabetes mellitus (HCC)   CKD (chronic kidney disease), stage III   Chronic diastolic CHF (congestive heart failure) (HCC)   Abnormal nuclear stress test   Protein-calorie malnutrition (HCC)   Acute on chronic diastolic heart failure (HCC) Diabetes Mellitus    Acute chest pain  - cardiology was consulted and patient underwent stress test 12/1 with findings consistent with ischemia - cath 12/2 with multivessel disease, Ost RCA to Prox RCA lesion, 99% stenosed, Prox RCA to Mid RCA lesion, 100% stenosed, Ost LAD to Prox LAD lesion, 70% stenosed, LM lesion, 50% stenosed, Mid Cx to Dist Cx lesion, 90% stenosed. - she will have medical management for now per cardiology notes  Chronic diastolic CHF  - euvolemic, weight up this morning, stop IVF  HTN - continue home medications  AKI on CKD III - Cr slightly worsening, discussed with Dr. 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   DM  - diet controlled - CBGs within goal, < 180  Recent scabies -  Under treatment, skin lesions improved significantly    Diet: Diet heart healthy/carb modified Room service appropriate?: Yes; Fluid consistency:: Thin Fluids: NS DVT Prophylaxis: heparin  Code Status: Full Code Family Communication: no family bedside Disposition Plan: home when ready   Barriers to discharge: renal failure  Consultants:  Cardiology     TCV  Nephrology (phone)  Procedures:  Stress test   Cardiac cath    Antibiotics  Anti-infectives    None       Studies  No results found. Objective  Filed Vitals:   01/25/15 1236 01/25/15 1324 01/25/15 2024 01/26/15 0440  BP: 179/76 152/49 171/38 168/58  Pulse:   58 47  Temp:  97.5 F (36.4 C) 98 F (36.7 C) 97.7 F (36.5 C)  TempSrc:  Oral Oral Oral  Resp:  20 20 20   Height:      Weight:    63.2 kg (139 lb 5.3 oz)  SpO2:  96% 98% 100%    Intake/Output Summary (Last 24 hours) at 01/26/15 0646 Last data filed at 01/26/15 0440  Gross per 24 hour  Intake 658.75 ml  Output   1550 ml  Net -891.25 ml   Filed Weights   01/24/15 0539 01/25/15 0446 01/26/15 0440  Weight: 60.873 kg (134 lb 3.2 oz) 61.508 kg (135 lb 9.6 oz) 63.2 kg (139 lb 5.3 oz)   Exam:  GENERAL: NAD  HEENT: no scleral icterus, PERRL  NECK: supple, no LAD  LUNGS: CTA biL, no wheezing  HEART: RRR without MRG  ABDOMEN: soft, non tender  NEURO: non focal   Data Reviewed: Basic Metabolic Panel:  Recent Labs Lab 01/21/15 1951 01/23/15 0532 01/24/15 0251 01/25/15 0255 01/26/15 0343  NA 138 141 141 139 139  K 4.1 3.7 4.2 3.9 4.0  CL 106 109 110 110 111  CO2 23 23 23  21* 22  GLUCOSE 223* 107* 138* 175* 162*  BUN 52*  42* 42* 49* 50*  CREATININE 1.61* 1.39* 1.63* 1.89* 1.96*  CALCIUM 9.7 8.7* 8.4* 8.4* 8.2*   CBC:  Recent Labs Lab 01/21/15 1951 01/23/15 0532 01/24/15 0251 01/25/15 0255 01/26/15 0343  WBC 8.0 7.1 7.5 7.2 7.4  HGB 10.3* 10.3* 9.8* 9.7* 9.0*  HCT 32.7* 32.5* 32.0* 30.5* 28.1*  MCV 91.9 91.5 92.2 91.9 92.1  PLT 319 288 291 288 236   Cardiac Enzymes:  Recent Labs Lab 01/22/15 0033 01/22/15 0503 01/22/15 0942  TROPONINI <0.03 <0.03 <0.03   BNP (last 3 results)  Recent Labs  11/07/14 0325 12/24/14 1030 01/21/15 2041  BNP 2634.2* 1288.9* 833.4*   CBG:  Recent Labs Lab 01/25/15 0617 01/25/15 1122 01/25/15 1602 01/25/15 2019  01/26/15 0500  GLUCAP 160* 174* 224* 195* 154*    Scheduled Meds: . amLODipine  10 mg Oral Daily  . aspirin EC  81 mg Oral Daily  . cloNIDine  0.1 mg Oral BID  . ferrous sulfate  325 mg Oral TID WC  . heparin  5,000 Units Subcutaneous 3 times per day  . metoprolol  50 mg Oral BID  . sodium chloride  3 mL Intravenous Q12H   Continuous Infusions:    Marzetta Board, MD [facility] Pager [phone]. If 7 PM - 7 AM, please contact night-coverage at [email or web address], password [facility] 01/26/2015, 6:46 AM  LOS: 1 day

## 2015-01-26 NOTE — Progress Notes (Signed)
CARDIAC REHAB PHASE I   PRE:  Rate/Rhythm: 48 SB    BP: sitting 162/48    SaO2: 96 RA  MODE:  Ambulation: 400 ft   POST:  Rate/Rhythm: 68 SR    BP: sitting 169/51     SaO2: 97 RA  Pt fairly steady. Needed rest x3 due to fatigue and SOB. Denied CP. Discussed sternal precautions, mobility, IS use (gave her one), watching preop video, reading [facility] booklet/guideline. Also discussed NTG use. Daughter and husband present, all voiced understanding.  [place], [place], ACSM 01/26/2015 2:06 PM

## 2015-01-26 NOTE — Progress Notes (Signed)
      [address]       [place] [address]             [phone]     CARDIOTHORACIC SURGERY PROGRESS NOTE  3 Days Post-Op  S/P Procedure(s) (LRB): Left Heart Cath and Coronary Angiography (N/A)  Subjective: Denies chest pain or SOB  Objective: Vital signs in last 24 hours: Temp:  [97.7 F (36.5 C)-98.2 F (36.8 C)] 98.2 F (36.8 C) (12/05 1115) Pulse Rate:  [47-58] 50 (12/05 1115) Cardiac Rhythm:  [-] Sinus bradycardia (12/05 1115) Resp:  [18-20] 18 (12/05 1115) BP: (164-171)/(38-61) 164/39 mmHg (12/05 1115) SpO2:  [98 %-100 %] 100 % (12/05 1115) Weight:  [63.2 kg (139 lb 5.3 oz)] 63.2 kg (139 lb 5.3 oz) (12/05 0440)  Physical Exam:  Rhythm:   sinus  Breath sounds: Coarse but symmetrical  Heart sounds:  RRR w/ systolic murmur  Incisions:  n/a  Abdomen:  soft  Extremities:  warm   Intake/Output from previous day: 12/04 0701 - 12/05 0700 In: 658.8 [P.O.:480; I.V.:178.8] Out: 1550 [Urine:1550] Intake/Output this shift: Total I/O In: 360 [P.O.:360] Out: 550 [Urine:550]  Lab Results:  Recent Labs  01/25/15 0255 01/26/15 0343  WBC 7.2 7.4  HGB 9.7* 9.0*  HCT 30.5* 28.1*  PLT 288 236   BMET:  Recent Labs  01/25/15 0255 01/26/15 0343  NA 139 139  K 3.9 4.0  CL 110 111  CO2 21* 22  GLUCOSE 175* 162*  BUN 49* 50*  CREATININE 1.89* 1.96*  CALCIUM 8.4* 8.2*    CBG (last 3)   Recent Labs  01/26/15 0500 01/26/15 1110 01/26/15 1619  GLUCAP 154* 193* 179*   PT/INR:   Recent Labs  01/24/15 0251  LABPROT 14.4  INR 1.10    CXR:  N/A   Pulmonary Function Tests  Baseline      Post-bronchodilator  FVC  1.51 L  (63% predicted) FVC  1.53 L  (64% predicted) FEV1  1.22 L  (67% predicted) FEV1  1.27 L  (70% predicted) FEF25-75 1.13 L  (64% predicted) FEF25-75 1.29 L  (73% predicted)  TLC  3.33 L  (82% predicted) RV  1.86 L  (107% predicted) DLCO  21% predicted    Assessment/Plan:  Results of PFT's notable for moderate  obstruction with severely reduced diffusion capacity and baseline ABG notable for severe hypoxia.  Risks associated with CABG would be high for numerous reasons, and the potential benefits remains a bit unclear.  Repeat cath with FFR might be helpful to confirm the significance of the patient's left main and LAD stenosis, which are not very impressive by cath and similar to the appearance of cath performed in 2007.  I agree w/ plans for a trial of medical therapy.   I would be happy to follow her as an outpatient along with Dr. Percival Spanish.    I spent in excess of 15 minutes during the conduct of this hospital encounter and >50% of this time involved direct face-to-face encounter with the patient for counseling and/or coordination of their care.   Rexene Alberts, MD 01/26/2015 5:41 PM

## 2015-01-26 NOTE — Progress Notes (Addendum)
Subjective:  No chest pain today.  No shortness of breath. Feels well and wants to go home.  Objective:  Vital Signs in the last 24 hours: BP 164/39 mmHg  Pulse 50  Temp(Src) 98.2 F (36.8 C) (Oral)  Resp 18  Ht 4\' 10"  (1.473 m)  Wt 63.2 kg (139 lb 5.3 oz)  BMI 29.13 kg/m2  SpO2 100%  Physical Exam: Obese pleasant female in no acute distress Neck: No JVD sitting upright. Lungs:  Mild bibasilar crackles Cardiac:  Regular rhythm, normal S1 and S2, no S3 Extremities:  1+ pitting edema bilaterally to the mid tibia  Intake/Output from previous day: 12/04 0701 - 12/05 0700 In: 658.8 [P.O.:480; I.V.:178.8] Out: 1550 [Urine:1550]  Weight Filed Weights   01/24/15 0539 01/25/15 0446 01/26/15 0440  Weight: 60.873 kg (134 lb 3.2 oz) 61.508 kg (135 lb 9.6 oz) 63.2 kg (139 lb 5.3 oz)    Lab Results: Basic Metabolic Panel:  Recent Labs  01/25/15 0255 01/26/15 0343  NA 139 139  K 3.9 4.0  CL 110 111  CO2 21* 22  GLUCOSE 175* 162*  BUN 49* 50*  CREATININE 1.89* 1.96*   CBC:  Recent Labs  01/25/15 0255 01/26/15 0343  WBC 7.2 7.4  HGB 9.7* 9.0*  HCT 30.5* 28.1*  MCV 91.9 92.1  PLT 288 236   Telemetry: Sinus bradycardia  Echo:  Study Conclusions  - Left ventricle: The cavity size was normal. There was mild concentric hypertrophy. Systolic function was normal. The estimated ejection fraction was in the range of 60% to 65%. Wall motion was normal; there were no regional wall motion abnormalities. Features are consistent with a pseudonormal left ventricular filling pattern, with concomitant abnormal relaxation and increased filling pressure (grade 2 diastolic dysfunction). - Mitral valve: Mildly to moderately calcified annulus. There was moderate regurgitation. Valve area by pressure half-time: 1.16 cm^2. Valve area by continuity equation (using LVOT flow): 1.69 cm^2. - Left atrium: The atrium was mildly dilated. - Right atrium: The atrium was  mildly dilated. - Pulmonary arteries: Systolic pressure was mildly increased. PA peak pressure: 40 mm Hg (S).   Assessment/Plan:  1.  Three-vessel coronary artery disease and moderate left main disease noted on cath.  Plan is for her to return for CABG after the holidays.  Medical management for now. 2.  Chronic diastolic heart failure- she appears volume overloaded on exam today.   3.  Hypertensive heart disease: Blood pressure poorly controlled, yet she is bradycardic. 4.  Stage III chronic kidney disease: Renal function is worsening despite holding lasix since 12/2.  I suspect there may be some renal vascular congestion and her renal function may improve with diuresis.    Recommendations:  - Medical management for CAD with aspirin.  If surgery will be later in December, would start plavix 75 mg daily with plans to hold 5 days pre-op. - Add atorvastatin 80 mg daily.  - Stop clonidine - Stop metoprolol and start carvedilol 25 mg daily for better BP effect and less HR lowering - Ideally, will start long-acting nitrates.  However, I will not do that at this time give that there are several new medications at this time. - Recommend nephrology consult given worsening renal function despite holding diuretics and hydration  Nahal Wanless C. Oval Linsey, MD, [facility]  Cardiology  01/26/2015, 11:36 AM

## 2015-01-27 DIAGNOSIS — I25110 Atherosclerotic heart disease of native coronary artery with unstable angina pectoris: Principal | ICD-10-CM

## 2015-01-27 DIAGNOSIS — E1122 Type 2 diabetes mellitus with diabetic chronic kidney disease: Secondary | ICD-10-CM

## 2015-01-27 LAB — BASIC METABOLIC PANEL
ANION GAP: 7 (ref 5–15)
BUN: 49 mg/dL — ABNORMAL HIGH (ref 6–20)
CALCIUM: 8.8 mg/dL — AB (ref 8.9–10.3)
CO2: 23 mmol/L (ref 22–32)
Chloride: 113 mmol/L — ABNORMAL HIGH (ref 101–111)
Creatinine, Ser: 1.93 mg/dL — ABNORMAL HIGH (ref 0.44–1.00)
GFR calc Af Amer: 30 mL/min — ABNORMAL LOW (ref >60)
GFR, EST NON AFRICAN AMERICAN: 26 mL/min — AB (ref >60)
GLUCOSE: 141 mg/dL — AB (ref 65–99)
Potassium: 5.7 mmol/L — ABNORMAL HIGH (ref 3.5–5.1)
Sodium: 143 mmol/L (ref 135–145)

## 2015-01-27 LAB — GLUCOSE, CAPILLARY
GLUCOSE-CAPILLARY: 137 mg/dL — AB (ref 65–99)
GLUCOSE-CAPILLARY: 175 mg/dL — AB (ref 65–99)
Glucose-Capillary: 143 mg/dL — ABNORMAL HIGH (ref 65–99)
Glucose-Capillary: 150 mg/dL — ABNORMAL HIGH (ref 65–99)

## 2015-01-27 LAB — CBC
HCT: 27.7 % — ABNORMAL LOW (ref 36.0–46.0)
HEMOGLOBIN: 8.9 g/dL — AB (ref 12.0–15.0)
MCH: 29.8 pg (ref 26.0–34.0)
MCHC: 32.1 g/dL (ref 30.0–36.0)
MCV: 92.6 fL (ref 78.0–100.0)
PLATELETS: 231 10*3/uL (ref 150–400)
RBC: 2.99 MIL/uL — ABNORMAL LOW (ref 3.87–5.11)
RDW: 15.7 % — AB (ref 11.5–15.5)
WBC: 7.4 10*3/uL (ref 4.0–10.5)

## 2015-01-27 MED ORDER — ISOSORBIDE MONONITRATE ER 30 MG PO TB24
30.0000 mg | ORAL_TABLET | Freq: Every day | ORAL | Status: DC
  Administered 2015-01-27 – 2015-01-28 (×2): 30 mg via ORAL
  Filled 2015-01-27 (×3): qty 1

## 2015-01-27 MED ORDER — SODIUM POLYSTYRENE SULFONATE 15 GM/60ML PO SUSP
30.0000 g | Freq: Once | ORAL | Status: AC
  Administered 2015-01-27: 30 g via ORAL
  Filled 2015-01-27: qty 120

## 2015-01-27 MED ORDER — CLOPIDOGREL BISULFATE 75 MG PO TABS
75.0000 mg | ORAL_TABLET | Freq: Every day | ORAL | Status: DC
  Administered 2015-01-27 – 2015-01-28 (×2): 75 mg via ORAL
  Filled 2015-01-27 (×2): qty 1

## 2015-01-27 MED ORDER — CLONIDINE HCL 0.1 MG PO TABS
0.1000 mg | ORAL_TABLET | Freq: Two times a day (BID) | ORAL | Status: DC
  Administered 2015-01-27 – 2015-01-28 (×3): 0.1 mg via ORAL
  Filled 2015-01-27 (×3): qty 1

## 2015-01-27 NOTE — Progress Notes (Signed)
PROGRESS NOTE  [name] [id number] DOB: 1950/03/29 DOA: 01/21/2015 PCP: [name], MD  HPI: 64 y.o. 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 Patient presents to the ED with c/o chest pain  Subjective / 24 H Interval events - no complaints, denies chest pain / dyspnea  Assessment/Plan: Principal Problem:   Acute chest pain Active Problems:   Diabetes mellitus (HCC)   CKD (chronic kidney disease), stage III   Chronic diastolic CHF (congestive heart failure) (HCC)   Abnormal nuclear stress test   Protein-calorie malnutrition (HCC)   Acute on chronic diastolic heart failure (HCC)   AKI (acute kidney injury) ([place]) Diabetes Mellitus    Acute chest pain  - cardiology was consulted and patient underwent stress test 12/1 with findings consistent with ischemia - cath 12/2 with multivessel disease, Ost RCA to Prox RCA lesion, 99% stenosed, Prox RCA to Mid RCA lesion, 100% stenosed, Ost LAD to Prox LAD lesion, 70% stenosed, LM lesion, 50% stenosed, Mid Cx to Dist Cx lesion, 90% stenosed. - she will have medical management for now, will follow up with cardiology and thoracic surgery as an outpatient   Chronic diastolic CHF  - euvolemic, weight stable, will not do more IVF since she is up 4-5 lbs since admission  HTN - continue home medications  AKI on CKD III - Cr slightly worsening after cath, suspect CIN - discussed over the phone with Dr. Lorrene Reid from Nephrology, closely monitor for now - slight if any improvement today, however she is hyperkalemic - continue to monitor, d/c home tomorrow if continues to improve  Hyperkalemia - K 5.7 this morning, Kayexalate - due to renal failure  DM  - diet controlled - CBGs within goal, < 180  Recent scabies -  Under treatment, skin lesions improved significantly    Diet: Diet heart healthy/carb modified Room service appropriate?: Yes;  Fluid consistency:: Thin Fluids: NS DVT Prophylaxis: heparin  Code Status: Full Code Family Communication: no family bedside Disposition Plan: home when ready   Barriers to discharge: renal failure, hyperkalemia  Consultants:  Cardiology   TCV  Nephrology (phone)  Procedures:  Stress test   Cardiac cath    Antibiotics  Anti-infectives    None       Studies  No results found. Objective  Filed Vitals:   01/27/15 0604 01/27/15 0800 01/27/15 1053 01/27/15 1159  BP: 150/57 162/53 169/53 173/83  Pulse: 55 52  56  Temp: 98.1 F (36.7 C)   98.5 F (36.9 C)  TempSrc: Oral   Oral  Resp: 18   18  Height:      Weight:      SpO2: 99%   98%    Intake/Output Summary (Last 24 hours) at 01/27/15 1211 Last data filed at 01/27/15 0912  Gross per 24 hour  Intake    600 ml  Output   1250 ml  Net   -650 ml   Filed Weights   01/25/15 0446 01/26/15 0440 01/27/15 0534  Weight: 61.508 kg (135 lb 9.6 oz) 63.2 kg (139 lb 5.3 oz) 63.2 kg (139 lb 5.3 oz)   Exam:  GENERAL: NAD  HEENT: no scleral icterus, PERRL  NECK: supple, no LAD  LUNGS: CTA biL, no wheezing  HEART: RRR without MRG  ABDOMEN: soft, non tender  NEURO: non focal   Data Reviewed: Basic Metabolic Panel:  Recent Labs Lab 01/23/15 0532 01/24/15 0251 01/25/15 0255 01/26/15  0343 01/27/15 0515  NA 141 141 139 139 143  K 3.7 4.2 3.9 4.0 5.7*  CL 109 110 110 111 113*  CO2 23 23 21* 22 23  GLUCOSE 107* 138* 175* 162* 141*  BUN 42* 42* 49* 50* 49*  CREATININE 1.39* 1.63* 1.89* 1.96* 1.93*  CALCIUM 8.7* 8.4* 8.4* 8.2* 8.8*   CBC:  Recent Labs Lab 01/23/15 0532 01/24/15 0251 01/25/15 0255 01/26/15 0343 01/27/15 0515  WBC 7.1 7.5 7.2 7.4 7.4  HGB 10.3* 9.8* 9.7* 9.0* 8.9*  HCT 32.5* 32.0* 30.5* 28.1* 27.7*  MCV 91.5 92.2 91.9 92.1 92.6  PLT 288 291 288 236 231   Cardiac Enzymes:  Recent Labs Lab 01/22/15 0033 01/22/15 0503 01/22/15 0942  TROPONINI <0.03 <0.03 <0.03   BNP  (last 3 results)  Recent Labs  11/07/14 0325 12/24/14 1030 01/21/15 2041  BNP 2634.2* 1288.9* 833.4*   CBG:  Recent Labs Lab 01/26/15 1110 01/26/15 1619 01/26/15 2213 01/27/15 0611 01/27/15 1123  GLUCAP 193* 179* 190* 137* 175*    Scheduled Meds: . amLODipine  10 mg Oral Daily  . aspirin EC  81 mg Oral Daily  . atorvastatin  80 mg Oral q1800  . carvedilol  25 mg Oral BID WC  . ferrous sulfate  325 mg Oral TID WC  . heparin  5,000 Units Subcutaneous 3 times per day  . sodium chloride  3 mL Intravenous Q12H   Continuous Infusions:    Marzetta Board, MD [facility] Pager [phone]. If 7 PM - 7 AM, please contact night-coverage at [email or web address], password [facility] 01/27/2015, 12:11 PM  LOS: 2 days

## 2015-01-27 NOTE — Progress Notes (Signed)
Patient: [name] / Admit Date: 01/21/2015 / Date of Encounter: 01/27/2015, 10:47 AM   Subjective: Feeling good. Walked halls without CP or SOB. No LEE.   Objective: Telemetry: SB HR currently mid 50s. Occ dips to upper 40s Physical Exam: Blood pressure 150/57, pulse 55, temperature 98.1 F (36.7 C), temperature source Oral, resp. rate 18, height 4\' 10"  (1.473 m), weight 139 lb 5.3 oz (63.2 kg), SpO2 99 %. General: Well developed obese WF, in no acute distress. Head: Normocephalic, atraumatic, sclera non-icteric, no xanthomas, nares are without discharge. Limited dentition Neck: JVP not elevated. Lungs: Clear bilaterally to auscultation without wheezes, rales, or rhonchi. Breathing is unlabored. Heart: RRR S1 S2 without murmurs, rubs, or gallops.  Abdomen: Soft, non-tender, non-distended with normoactive bowel sounds. No rebound/guarding. Extremities: No clubbing or cyanosis. No edema. Distal pedal pulses are 2+ and equal bilaterally. Neuro: Alert and oriented X 3. Moves all extremities spontaneously. Psych:  Responds to questions appropriately with a normal affect.   Intake/Output Summary (Last 24 hours) at 01/27/15 1047 Last data filed at 01/27/15 0912  Gross per 24 hour  Intake    600 ml  Output   1450 ml  Net   -850 ml    Inpatient Medications:  . amLODipine  10 mg Oral Daily  . aspirin EC  81 mg Oral Daily  . atorvastatin  80 mg Oral q1800  . carvedilol  25 mg Oral BID WC  . ferrous sulfate  325 mg Oral TID WC  . heparin  5,000 Units Subcutaneous 3 times per day  . sodium chloride  3 mL Intravenous Q12H  . sodium polystyrene  30 g Oral Once   Infusions:    Labs:  Recent Labs  01/26/15 0343 01/27/15 0515  NA 139 143  K 4.0 5.7*  CL 111 113*  CO2 22 23  GLUCOSE 162* 141*  BUN 50* 49*  CREATININE 1.96* 1.93*  CALCIUM 8.2* 8.8*   No results for input(s): AST, ALT, ALKPHOS, BILITOT, PROT, ALBUMIN in the last 72 hours.  Recent Labs  01/26/15 0343  01/27/15 0515  WBC 7.4 7.4  HGB 9.0* 8.9*  HCT 28.1* 27.7*  MCV 92.1 92.6  PLT 236 231      Radiology/Studies:  Dg Chest 2 View  01/21/2015  CLINICAL DATA:  Chest pain on the left side starting today. EXAM: CHEST  2 VIEW COMPARISON:  December 24, 2014 FINDINGS: The mediastinal contour is normal. The heart size is enlarged. There is diffuse increased pulmonary interstitium. There is no focal pneumonia or pleural effusion. The lungs are hyperinflated. No acute abnormalities identified within the visualized bones. There is scoliosis of spine. IMPRESSION: Mild interstitial edema. Electronically Signed   By: Abelardo Diesel M.D.   On: 01/21/2015 19:51   Nm Myocar Multi W/spect W/wall Motion / Ef  01/22/2015   There was no ST segment deviation noted during stress.  No T wave inversion was noted during stress.  Defect 1: There is a medium defect of moderate severity.  Findings consistent with ischemia.  Nuclear stress EF: 52%.  Moderate size and intensity, reversible inferior wall perfusion defect suggestive of ischemia (SDS 4). LVEF 52% with inferior hypokinesis. This is an intermediate risk study.     Assessment and Plan  67F with CAD (NSTEMI 2007 in setting of anemia; LHC 50% LM, 60% LAD, 30% RI, occluded RCA with L-R collaterals, normal EF, treated medically), chronic diastolic CHF, CKD stage III, tobacco abuse, PVD s/p left common iliac  and external iliac artery stents as well as mesenteric/celiac/subclavian stenosis by duplexes below, renal artery stenosis, carotid disease s/p left carotid endarterectomy ~2000, bradycardia limiting BB titration, DM, COPD, HLD, HTN, hypertensive heart disease, GI AVM s/p cauterization, iron deficiency anemia, ?possible hx of TIA (in problem list by Dr. Percival Spanish) and scabies. 2D Echo 10/2014: mild LVH, EF 0000000, +LV diastolic dysfunction, aortic sclerosis without stenosis, mildly dilated LA/RA, mild TR, PASP 41mmHg. Admitted with chest pain/unstable angina -> nuc  abnormal. LHC 01/23/15: 3V CAD and moderate LM disease.  1. Unstable angina/CAD - surgical note yesterday indicates results of PFT's notable for moderate obstruction with severely reduced diffusion capacity and baseline ABG notable for severe hypoxia, thus risks associated with CABG would be high for numerous reasons, and the potential benefits remains a bit unclear.Dr. Roxy Manns suggests that repeat cath with FFR might be helpful to confirm the significance of the patient's left main and LAD stenosis, which are not very impressive by cath and similar to the appearance of cath performed in 2007. He agrees with plans for trial of medical therapy. Will review rec for FFR & Plavix with MD. Continue ASA, BB, statin.   2. Acute on chronic diastolic CHF with hypertensive heart disease - yesterday metoprolol was stopped and carvedilol started, along with cessation of clonidine. Can consider addition of nitrate since BP remains elevated.  Yesterday there was recommendation of nephrology consult given volume overload/Cr remaining elevated despite holding of diuretics. Will review this recommendation for nephro input. The patient's weight remains the same today at 139lb but there is no significant edema on exam. Given lack of symptoms would continue to hold diuretics at this time.  3. AKI on CKD stage III with hyperkalemia - prior OP Cr ~1.3. Remains in 1.9 range. IM treating high K.  4. Chronic anemia - per IM.  Signed, Melina Copa PA-C Pager: [phone]

## 2015-01-27 NOTE — Progress Notes (Signed)
CARDIAC REHAB PHASE I   PRE:  Rate/Rhythm: 3 SB  BP:  Sitting: 185/44        SaO2: 96 RA  MODE:  Ambulation: 300 ft   POST:  Rate/Rhythm: 79 SR  BP:  Sitting: 192/40         SaO2: 95 RA  Pt ambulated 300 ft on RA, handheld assist, steady gait, tolerated fair.  Pt c/o of DOE, lower back pain, denies cp, dizziness,  standing rest x3. BP elevated pre and post ambulation, RN aware. Completed education for unstable angina/medical management of CAD with pt and husband at bedside.  Reviewed risk factors, tobacco cessation, anti-platelet therapy, activity restrictions, ntg, exercise, heart healthy diet, carb counting, portion control,sodium restrictions, CHF booklet and zone tool, and daily weights. Pt verbalized understanding. Pt does not qualify for phase 2 cardiac rehab referral at this time with diagnosis of unstable angina. Will defer phase 2 referral for now. Pt to bed after walk, call bell within reach. Will follow as schedule permits.   [id number] Lenna Sciara, RN, BSN 01/27/2015 3:01 PM

## 2015-01-28 DIAGNOSIS — N179 Acute kidney failure, unspecified: Secondary | ICD-10-CM

## 2015-01-28 LAB — CBC
HEMATOCRIT: 27.6 % — AB (ref 36.0–46.0)
Hemoglobin: 8.6 g/dL — ABNORMAL LOW (ref 12.0–15.0)
MCH: 28.9 pg (ref 26.0–34.0)
MCHC: 31.2 g/dL (ref 30.0–36.0)
MCV: 92.6 fL (ref 78.0–100.0)
PLATELETS: 259 10*3/uL (ref 150–400)
RBC: 2.98 MIL/uL — ABNORMAL LOW (ref 3.87–5.11)
RDW: 16 % — AB (ref 11.5–15.5)
WBC: 7.3 10*3/uL (ref 4.0–10.5)

## 2015-01-28 LAB — BASIC METABOLIC PANEL
Anion gap: 10 (ref 5–15)
BUN: 42 mg/dL — AB (ref 6–20)
CO2: 22 mmol/L (ref 22–32)
CREATININE: 1.71 mg/dL — AB (ref 0.44–1.00)
Calcium: 8.6 mg/dL — ABNORMAL LOW (ref 8.9–10.3)
Chloride: 109 mmol/L (ref 101–111)
GFR calc Af Amer: 35 mL/min — ABNORMAL LOW (ref >60)
GFR, EST NON AFRICAN AMERICAN: 30 mL/min — AB (ref >60)
GLUCOSE: 129 mg/dL — AB (ref 65–99)
POTASSIUM: 3.7 mmol/L (ref 3.5–5.1)
SODIUM: 141 mmol/L (ref 135–145)

## 2015-01-28 LAB — GLUCOSE, CAPILLARY
Glucose-Capillary: 144 mg/dL — ABNORMAL HIGH (ref 65–99)
Glucose-Capillary: 201 mg/dL — ABNORMAL HIGH (ref 65–99)

## 2015-01-28 MED ORDER — ISOSORBIDE MONONITRATE ER 30 MG PO TB24: 30.0000 mg | ORAL_TABLET | Freq: Every day | ORAL | Status: DC

## 2015-01-28 MED ORDER — NITROGLYCERIN 0.4 MG SL SUBL: 0.4000 mg | SUBLINGUAL_TABLET | SUBLINGUAL | Status: DC | PRN

## 2015-01-28 MED ORDER — CLOPIDOGREL BISULFATE 75 MG PO TABS: 75.0000 mg | ORAL_TABLET | Freq: Every day | ORAL | Status: DC

## 2015-01-28 MED ORDER — ATORVASTATIN CALCIUM 80 MG PO TABS: 80.0000 mg | ORAL_TABLET | Freq: Every day | ORAL | Status: DC

## 2015-01-28 MED ORDER — CARVEDILOL 25 MG PO TABS: 25.0000 mg | ORAL_TABLET | Freq: Two times a day (BID) | ORAL | Status: DC

## 2015-01-28 NOTE — Discharge Summary (Signed)
Physician Discharge Summary  [name] [id number] DOB: 06-22-1950 DOA: 01/21/2015  PCP: [name], MD  Admit date: 01/21/2015 Discharge date: 01/28/2015  Time spent: greater than 30 mintues  Recommendations for Outpatient Follow-up:  Monitor BMET  Discharge Diagnoses:  Principal Problem:   Unstable angina Active Problems:   Diabetes mellitus ([place])   CKD (chronic kidney disease), stage III   Chronic diastolic CHF (congestive heart failure) (HCC)   Abnormal nuclear stress test   Protein-calorie malnutrition ([name])   Acute on chronic diastolic heart failure (HCC)   AKI (acute kidney injury) ([place])   Discharge Condition: stable  Diet recommendation: heart healthy, carbohydrate modified  Filed Weights   01/26/15 0440 01/27/15 0534 01/28/15 0450  Weight: 63.2 kg (139 lb 5.3 oz) 63.2 kg (139 lb 5.3 oz) 62.959 kg (138 lb 12.8 oz)    History of present illness:  64 y.o. female with h/o CAD, PAD, carotid disease, RAS, diastolic CHF. Patient presents to the ED with c/o chest pain. Symptoms intermittent throughout the day today. Associated SOB, nausea. Pain initially 8/10 and BP initially [id number] systolic. CP improved with nitroglycerin  Hospital Course:   Unstable angina - cardiology was consulted and patient underwent stress test 12/1 with findings consistent with ischemia - cath 12/2 with multivessel disease, Ost RCA to Prox RCA lesion, 99% stenosed, Prox RCA to Mid RCA lesion, 100% stenosed, Ost LAD to Prox LAD lesion, 70% stenosed, LM lesion, 50% stenosed, Mid Cx to Dist Cx lesion, 90% stenosed. - she will have medical management for now, will follow up with cardiology and thoracic surgery as an outpatient  plavix and imdur started. No ARB/ACE due to renal insufficiency  Chronic diastolic CHF  - euvolemic, weight stable,   HTN Improved by discharge  AKI on CKD III - Cr slightly worsening after cath, suspect CIN By discharge, improving  Hyperkalemia - K  5.7 on 12/6. Normalized with kayexalate  DM  - diet controlled - CBGs within goal, < 180  Recent scabies treated  Code Status: Full Code  Consultants:  Cardiology   TCTS  Procedures:  Stress test   Cardiac cath   Discharge Exam: Filed Vitals:   01/28/15 0450 01/28/15 1005  BP: 149/40 154/54  Pulse: 54 53  Temp: 97.9 F (36.6 C)   Resp: 16     General: a and o Cardiovascular: RRR Respiratory: CTA  Discharge Instructions   Discharge Instructions    Diet - low sodium heart healthy    Complete by:  As directed      Discharge instructions    Complete by:  As directed   Stop metoprolol and losaartan     Increase activity slowly    Complete by:  As directed           Current Discharge Medication List    START taking these medications   Details  atorvastatin (LIPITOR) 80 MG tablet Take 1 tablet (80 mg total) by mouth daily at 6 PM. Qty: 30 tablet, Refills: 1    carvedilol (COREG) 25 MG tablet Take 1 tablet (25 mg total) by mouth 2 (two) times daily with a meal. Qty: 60 tablet, Refills: 1    clopidogrel (PLAVIX) 75 MG tablet Take 1 tablet (75 mg total) by mouth daily. Qty: 30 tablet, Refills: 0    isosorbide mononitrate (IMDUR) 30 MG 24 hr tablet Take 1 tablet (30 mg total) by mouth daily. Qty: 30 tablet, Refills: 1    nitroGLYCERIN (NITROSTAT) 0.4 MG SL tablet Place  1 tablet (0.4 mg total) under the tongue every 5 (five) minutes as needed for chest pain. Qty: 10 tablet, Refills: 0      CONTINUE these medications which have NOT CHANGED   Details  acetaminophen (TYLENOL) 500 MG tablet Take 500 mg by mouth every 6 (six) hours as needed (pain).     albuterol (PROVENTIL HFA;VENTOLIN HFA) 108 (90 BASE) MCG/ACT inhaler Inhale 2 puffs into the lungs every 6 (six) hours as needed for wheezing or shortness of breath. Qty: 1 Inhaler, Refills: 0   Associated Diagnoses: Chronic obstructive pulmonary disease, unspecified COPD type (HCC)    amLODipine  (NORVASC) 10 MG tablet Take 10 mg by mouth daily.     aspirin EC 81 MG tablet Take 81 mg by mouth daily.    Calcium Carbonate Antacid (TUMS PO) Take 1 tablet by mouth daily as needed (acid reflux/heartburn).    cloNIDine (CATAPRES) 0.1 MG tablet Take 0.1 mg by mouth 2 (two) times daily. Refills: 11    Ferrous Sulfate (IRON) 325 (65 FE) MG TABS Take 325 mg by mouth 3 (three) times daily.     fish oil-omega-3 fatty acids 1000 MG capsule Take 1 g by mouth 3 (three) times daily. Three times daily    furosemide (LASIX) 80 MG tablet Take 0.5 tablets (40 mg total) by mouth 2 (two) times daily. Qty: 60 tablet, Refills: 5    hydrocortisone 2.5 % lotion Apply topically 2 (two) times daily. Qty: 59 mL, Refills: 2   Associated Diagnoses: Pruritic [name]      STOP taking these medications     losartan (COZAAR) 50 MG tablet      metoprolol (LOPRESSOR) 50 MG tablet      permethrin (ACTICIN) 5 % cream        No Known Allergies Follow-up Information    Follow up with [name], MD In 1 week.   Specialty:  Family Medicine   Contact information:   [address] [place] [address] [phone]       Follow up with Minus Breeding, MD.   Specialty:  Cardiology   Why:  his office will call you   Contact information:   [address] [place] [address] [phone]        The results of significant diagnostics from this hospitalization (including imaging, microbiology, ancillary and laboratory) are listed below for reference.    Significant Diagnostic Studies: Dg Chest 2 View  01/21/2015  CLINICAL DATA:  Chest pain on the left side starting today. EXAM: CHEST  2 VIEW COMPARISON:  December 24, 2014 FINDINGS: The mediastinal contour is normal. The heart size is enlarged. There is diffuse increased pulmonary interstitium. There is no focal pneumonia or pleural effusion. The lungs are hyperinflated. No acute abnormalities identified within the  visualized bones. There is scoliosis of spine. IMPRESSION: Mild interstitial edema. Electronically Signed   By: Abelardo Diesel M.D.   On: 01/21/2015 19:51   Nm Myocar Multi W/spect W/wall Motion / Ef  01/22/2015   There was no ST segment deviation noted during stress.  No T wave inversion was noted during stress.  Defect 1: There is a medium defect of moderate severity.  Findings consistent with ischemia.  Nuclear stress EF: 52%.  Moderate size and intensity, reversible inferior wall perfusion defect suggestive of ischemia (SDS 4). LVEF 52% with inferior hypokinesis. This is an intermediate risk study.    Microbiology: No results found for this or any previous  visit (from the past 240 hour(s)).   Labs: Basic Metabolic Panel:  Recent Labs Lab 01/24/15 0251 01/25/15 0255 01/26/15 0343 01/27/15 0515 01/28/15 0258  NA 141 139 139 143 141  K 4.2 3.9 4.0 5.7* 3.7  CL 110 110 111 113* 109  CO2 23 21* 22 23 22   GLUCOSE 138* 175* 162* 141* 129*  BUN 42* 49* 50* 49* 42*  CREATININE 1.63* 1.89* 1.96* 1.93* 1.71*  CALCIUM 8.4* 8.4* 8.2* 8.8* 8.6*   Liver Function Tests:  Recent Labs Lab 01/24/15 0251  AST 12*  ALT 9*  ALKPHOS 76  BILITOT 0.6  PROT 5.7*  ALBUMIN 2.3*   No results for input(s): LIPASE, AMYLASE in the last 168 hours. No results for input(s): AMMONIA in the last 168 hours. CBC:  Recent Labs Lab 01/24/15 0251 01/25/15 0255 01/26/15 0343 01/27/15 0515 01/28/15 0258  WBC 7.5 7.2 7.4 7.4 7.3  HGB 9.8* 9.7* 9.0* 8.9* 8.6*  HCT 32.0* 30.5* 28.1* 27.7* 27.6*  MCV 92.2 91.9 92.1 92.6 92.6  PLT 291 288 236 231 259   Cardiac Enzymes:  Recent Labs Lab 01/22/15 0033 01/22/15 0503 01/22/15 0942  TROPONINI <0.03 <0.03 <0.03   BNP: BNP (last 3 results)  Recent Labs  11/07/14 0325 12/24/14 1030 01/21/15 2041  BNP 2634.2* 1288.9* 833.4*    ProBNP (last 3 results) No results for input(s): PROBNP in the last 8760 hours.  CBG:  Recent Labs Lab  01/27/15 0611 01/27/15 1123 01/27/15 1529 01/27/15 2137 01/28/15 0620  GLUCAP 137* 175* 143* 150* 144*       Signed:  Khizar Fiorella L  [facility] 01/28/2015, 10:16 AM

## 2015-01-28 NOTE — Hospital Discharge Follow-Up (Signed)
Transitional Care Clinic Care Coordination Note:  Admit date:  01/21/15 Discharge date: 01/28/15 Discharge Disposition: Home with spouse. Patient contact: [phone] (home) Emergency contact(s): Tonya-daughter [phone])  This Case Manager reviewed patient's EMR and determined patient would benefit from post-discharge medical management and chronic care management services through the [facility]. Patient has a history of diabetes mellitus, CKD Stage III, CHF. Admitted with chest pain.  Patient has had 3 inpatient admissions in the last six months. This Case Manager met with patient to discuss the services and medical management that can be provided at the [facility]. Patient verbalized understanding and agreed to receive post-discharge care at the [facility].   Patient scheduled for Transitional Care appointment on 02/04/15 at 1445 with Dr. Jarold Song.  Clinic information and appointment time provided to patient. Appointment information also placed on AVS.  Assessment:       Home Environment: Patient lives with her spouse and son in a private residence.       Support System: family members, neighbors       Level of functioning: independent       Home DME: Patient has a cane and walker that she uses when she is "not feeling steady."  She also indicated she has continuous home oxygen at 2L per nasal cannula. [employer] [facility] [employer] is oxygen supplier. She also has a home nebulizer.       Home care services: none       Transportation: Patient's daughter takes her to her medical appointments.        Food/Nutrition: Patient indicated her daughter cooks her meals. She indicated her food stamps were reduced from $200/month to $16/month in October 2016.  Informed patient she should go to Department of Social Services to follow-up on food stamps reduction. Patient verbalized understanding. She also indicated she has access to needed food.         Medications: Patient  indicated she uses [facility] and [place] for her medications, and she is able to afford her medications.        Identified Barriers: uninsured. Patient indicated her Medicare will become active when she turns 51. She indicated she thinks she has applied for Medicaid in the past but is uncertain status of application. Encouraged patient to call Department of Social Services to determine application status. In addition, informed patient that [place] has [employer]er, fashion/clothing. Informed patient she would benefit from an appointment with Financial Counselor to determine if she is eligible for the [employer] or [employer]. Patient verbalized understanding.        PCP: Cammie Sickle, NP at [facility]             Arranged services:        Services communicated to Olga Coaster, RN CM

## 2015-01-28 NOTE — Progress Notes (Signed)
Orders received for pt discharge.  Discharge summary printed and reviewed with pt.  Explained medication regimen, and pt had no further questions at this time.  IV removed and site remains clean, dry, intact.  Telemetry removed.  Pt in stable condition and awaiting transport. 

## 2015-01-28 NOTE — Progress Notes (Signed)
CARDIAC REHAB PHASE I   Went to ambulate with pt, pt standing in doorway, dressed, awaiting wheelchair for discharge. Pt and husband state they have no questions at this time regarding yesterdays' education. Will sign off.   Lenna Sciara, RN, BSN 01/28/2015 1:43 PM

## 2015-01-29 ENCOUNTER — Telehealth: Payer: Self-pay

## 2015-01-29 NOTE — Telephone Encounter (Signed)
Transitional Care Clinic Post-discharge Follow-Up Phone Call:  Date of Discharge: 01/28/15 Principal Discharge Diagnosis(es): Acute chest pain Post-discharge Communication: Attempt #1 to reach patient.  Call placed to [phone]. Unable to reach patient and unable to leave message because there was not a voicemail message. In addition, placed call to [phone]; unable to reach patient. Left voicemail requesting return call. Call completed: No

## 2015-01-30 ENCOUNTER — Telehealth: Payer: Self-pay

## 2015-01-30 NOTE — Telephone Encounter (Signed)
[facility] Post-discharge Follow-Up Phone Call:  Date of Discharge: 01/28/15 Principal Discharge Diagnosis(es): Acute chest pain Call Completed: Yes         With Whom: Patient and Daughter, [name]  Please check all that apply:  X  Patient is knowledgeable of his/her condition(s) and/or treatment. X  Patient is caring for self at home.  X  Patient is receiving assist at home from family and/or caregiver. Family and/or caregiver is knowledgeable of patient's condition(s) and/or treatment. ? Patient is receiving home health services. If so, name of agency.     Medication Reconciliation:  X  Medication list reviewed with patient. X  Patient obtained all discharge medications. Patient's discharge medications reviewed with patient. Patient verbalized understanding. Patient then wanted this Case Manager to review medications with her daughter, [name] since her daughter sets up her pill box.  Patient gave verbal consent for this Case Manager to speak with her daughter.  Patient's discharge medications reviewed with patient's daughter as well. Also informed patient's daughter that per discharge medications patient should stop taking metoprolol, losartan, and permethrin 5% cream.  Informed patient's daughter patient should bring all medications to her initial [facility] appointment on 02/04/15. Patient's daughter verbalized understanding to all instructions.   Activities of Daily Living:  X  Independent-though patient uses a cane and walker as needed. She also has continuous oxygen at 2L per nasal cannula as well as a home nebulizer. Oxygen supplier is [place]. ? Needs assist  ? [facility]    Community resources in place for patient:  X  None  ? Home Health ? Assisted Living ? Support Group          Patient Education: This Case Manager placed call to patient for post-discharge follow-up. Patient denied shortness of breath and chest pain. She indicated she  had a little swelling in her feet but was propping up her feet as needed. Patient indicated she was eating a low sodium heart health diet. Patient reminded of initial [facility] appointment on 02/04/15 at 1445 with Dr. Jarold Song.  Patient indicated she planned to be at her appointment and had transportation to her appointment. Patient's discharge medications thoroughly reviewed with patient and patient's daughter, and patient informed to bring all medications to her appointment.  No additional concerns identified.

## 2015-02-03 ENCOUNTER — Telehealth: Payer: Self-pay

## 2015-02-03 NOTE — Telephone Encounter (Signed)
Call placed to the patient to confirm her appointment for tomorrow, 02/04/15 @ 1445. She said that her daughter would be bringing her to the clinic. Instructed her to bring all of her medications to her appointment and she stated that she would. She noted that she has all of her medications and is taking them as ordered. She is also using her O2 as ordered. She reported no questions/problems at this time.

## 2015-02-04 ENCOUNTER — Encounter (HOSPITAL_BASED_OUTPATIENT_CLINIC_OR_DEPARTMENT_OTHER): Payer: Self-pay | Admitting: Clinical

## 2015-02-04 ENCOUNTER — Encounter: Payer: Self-pay | Admitting: Family Medicine

## 2015-02-04 ENCOUNTER — Ambulatory Visit: Payer: Medicaid Other | Attending: Family Medicine | Admitting: Family Medicine

## 2015-02-04 VITALS — BP 166/50 | HR 50 | Temp 98.3°F | Resp 16 | Ht <= 58 in | Wt 137.0 lb

## 2015-02-04 DIAGNOSIS — I2510 Atherosclerotic heart disease of native coronary artery without angina pectoris: Secondary | ICD-10-CM | POA: Insufficient documentation

## 2015-02-04 DIAGNOSIS — I5033 Acute on chronic diastolic (congestive) heart failure: Secondary | ICD-10-CM | POA: Diagnosis not present

## 2015-02-04 DIAGNOSIS — I739 Peripheral vascular disease, unspecified: Secondary | ICD-10-CM | POA: Insufficient documentation

## 2015-02-04 DIAGNOSIS — R001 Bradycardia, unspecified: Secondary | ICD-10-CM | POA: Insufficient documentation

## 2015-02-04 DIAGNOSIS — Z79899 Other long term (current) drug therapy: Secondary | ICD-10-CM | POA: Insufficient documentation

## 2015-02-04 DIAGNOSIS — N183 Chronic kidney disease, stage 3 unspecified: Secondary | ICD-10-CM

## 2015-02-04 DIAGNOSIS — Z9981 Dependence on supplemental oxygen: Secondary | ICD-10-CM | POA: Diagnosis not present

## 2015-02-04 DIAGNOSIS — E1122 Type 2 diabetes mellitus with diabetic chronic kidney disease: Secondary | ICD-10-CM | POA: Diagnosis not present

## 2015-02-04 DIAGNOSIS — D638 Anemia in other chronic diseases classified elsewhere: Secondary | ICD-10-CM | POA: Insufficient documentation

## 2015-02-04 DIAGNOSIS — F329 Major depressive disorder, single episode, unspecified: Secondary | ICD-10-CM | POA: Diagnosis not present

## 2015-02-04 DIAGNOSIS — I701 Atherosclerosis of renal artery: Secondary | ICD-10-CM | POA: Diagnosis not present

## 2015-02-04 DIAGNOSIS — Z720 Tobacco use: Secondary | ICD-10-CM | POA: Insufficient documentation

## 2015-02-04 DIAGNOSIS — F17210 Nicotine dependence, cigarettes, uncomplicated: Secondary | ICD-10-CM | POA: Insufficient documentation

## 2015-02-04 DIAGNOSIS — I259 Chronic ischemic heart disease, unspecified: Secondary | ICD-10-CM | POA: Diagnosis not present

## 2015-02-04 DIAGNOSIS — I10 Essential (primary) hypertension: Secondary | ICD-10-CM | POA: Insufficient documentation

## 2015-02-04 DIAGNOSIS — Z658 Other specified problems related to psychosocial circumstances: Secondary | ICD-10-CM

## 2015-02-04 DIAGNOSIS — F419 Anxiety disorder, unspecified: Secondary | ICD-10-CM | POA: Diagnosis not present

## 2015-02-04 DIAGNOSIS — J449 Chronic obstructive pulmonary disease, unspecified: Secondary | ICD-10-CM | POA: Diagnosis not present

## 2015-02-04 DIAGNOSIS — R079 Chest pain, unspecified: Secondary | ICD-10-CM | POA: Insufficient documentation

## 2015-02-04 DIAGNOSIS — F341 Dysthymic disorder: Secondary | ICD-10-CM | POA: Insufficient documentation

## 2015-02-04 DIAGNOSIS — I129 Hypertensive chronic kidney disease with stage 1 through stage 4 chronic kidney disease, or unspecified chronic kidney disease: Secondary | ICD-10-CM | POA: Insufficient documentation

## 2015-02-04 DIAGNOSIS — Z7982 Long term (current) use of aspirin: Secondary | ICD-10-CM | POA: Diagnosis not present

## 2015-02-04 DIAGNOSIS — E785 Hyperlipidemia, unspecified: Secondary | ICD-10-CM | POA: Insufficient documentation

## 2015-02-04 DIAGNOSIS — I5031 Acute diastolic (congestive) heart failure: Secondary | ICD-10-CM

## 2015-02-04 DIAGNOSIS — F0390 Unspecified dementia without behavioral disturbance: Secondary | ICD-10-CM | POA: Diagnosis not present

## 2015-02-04 LAB — POCT GLYCOSYLATED HEMOGLOBIN (HGB A1C): Hemoglobin A1C: 6.5

## 2015-02-04 LAB — GLUCOSE, POCT (MANUAL RESULT ENTRY): POC GLUCOSE: 144 mg/dL — AB (ref 70–99)

## 2015-02-04 MED ORDER — SERTRALINE HCL 50 MG PO TABS: 50.0000 mg | ORAL_TABLET | Freq: Every day | ORAL | Status: DC

## 2015-02-04 NOTE — Progress Notes (Signed)
Patient here for TCC f/up for chest pain. Patient reports feeling "good."   Patient would like to talk about possibly obtaining a portable O2 tank.

## 2015-02-04 NOTE — Progress Notes (Addendum)
ASSESSMENT: Pt currently experiencing psychosocial stressor, needs to f/u with TCC physician, and may benefit from supportive counseling regarding coping with psychosocial stressor.  Stage of Change: action  PLAN: 1. F/U with behavioral health consultant in as needed 2. Psychiatric Medications: zoloft. 3. Behavioral recommendation(s):   -Call medical supply companies on list given in office visit, compare prices for self-pay portable oxygen  SUBJECTIVE: Pt. referred by Dr Jarold Song for community resources:  Pt. reports the following symptoms/concerns: Pt states that she wants to find portable oxygen tanks; she is currently uninsured and worries because she will not have health insurance for 3 months, when she turns 65.  Duration of problem: today Severity: mild  OBJECTIVE: Orientation & Cognition: Oriented x3. Thought processes normal and appropriate to situation. Mood: appropriate. Affect: appropriate Appearance: appropriate Risk of harm to self or others: no known risk of harm to self or others Substance use: tobacco Assessments administered: PHQ2: 4  Diagnosis: Psychosocial stressor CPT Code: Z65.8 -------------------------------------------- Other(s) present in the room: grown daughter  Time spent with patient in exam room: 10 minutes

## 2015-02-05 LAB — MICROALBUMIN / CREATININE URINE RATIO
CREATININE, URINE: 34 mg/dL (ref 20–320)
Microalb Creat Ratio: 4126 mcg/mg creat — ABNORMAL HIGH (ref <30)
Microalb, Ur: 140.3 mg/dL

## 2015-02-05 LAB — BASIC METABOLIC PANEL
BUN: 37 mg/dL — AB (ref 7–25)
CHLORIDE: 109 mmol/L (ref 98–110)
CO2: 25 mmol/L (ref 20–31)
Calcium: 9.7 mg/dL (ref 8.6–10.4)
Creat: 1.33 mg/dL — ABNORMAL HIGH (ref 0.50–0.99)
GLUCOSE: 108 mg/dL — AB (ref 65–99)
POTASSIUM: 5.3 mmol/L (ref 3.5–5.3)
SODIUM: 145 mmol/L (ref 135–146)

## 2015-02-06 ENCOUNTER — Telehealth: Payer: Self-pay | Admitting: *Deleted

## 2015-02-06 NOTE — Telephone Encounter (Signed)
-----   Message from Arnoldo Morale, MD sent at 02/05/2015 10:51 PM EST ----- Labs reveal evidence of chronic kidney disease. Keep appointment with Nephrologist.

## 2015-02-06 NOTE — Telephone Encounter (Signed)
Spoke to patient and verified name and date of birth-gave results and told patient that it was important to keep her appointment with her kidney doctor.  Patient stated she would.

## 2015-02-06 NOTE — Progress Notes (Signed)
[place]  Date of telephone encounter: 01/28/15  Admit date: 01/21/15 Discharge date: 01/28/15  PCP: None   HPI: [name] is a 64 y.o. female with a history of type 2 diabetes mellitus, hypertension, COPD, diastolic CHF on chronic oxygen use, stage III chronic kidney disease who presented to the ED with chest pain which improved with nitroglycerin and underwent a stress test which revealed findings persistent with ischemia. She was taken to the Cath Lab with cardiac cath results revealing multivessel disease: Ost RCA to Prox RCA lesion, 99% stenosed, Prox RCA to Mid RCA lesion, 100% stenosed, Ost LAD to Prox LAD lesion, 70% stenosed, LM lesion, 50% stenosed, Mid Cx to Dist Cx lesion, 90% stenosed. Medical management was recommended to with outpatient follow-up with cardiology and thoracic surgery.  Interval history: She reports doing well and has appointments to see her cardiologist and nephrologist. She is requesting a prescription for portable oxygen which I have discussed with the social worker as she currently has no medical coverage; she complains of losing her insurance once-[id number] but states her cardiologist and nephrologist have been working with her regarding payment plans. Patient has No headache, No chest pain, No abdominal pain - No Nausea, No new weakness tingling or numbness, No Cough; she has shortness of breath at rest on remains on 2 L oxygen at rest.  No Known Allergies Past Medical History  Diagnosis Date  . Peripheral vascular disease ([place])     a.s /p left common iliac and external iliac artery stents. b. H/o subclavian, mesenteric, and celiac artery stenosis (see below).  . Diabetes mellitus   . COPD (chronic obstructive pulmonary disease) ([facility])     home oxygen  . Hyperlipidemia   . Depression   . Chronic diastolic CHF (congestive heart failure) ([place])   . Essential hypertension   . Renal artery stenosis ([place])     a. Duplex 2014: no obvious  evidence of hemodynamically significant stenosis >60%, bilateral intrarenal arteries exhibit absent diastolic flow. There is evidence of elevated velocities of the mid aorta. 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  [name] CAD (coronary artery disease)   . Iron deficiency anemia   . Diabetic retinopathy ([place])   . 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 Carotid artery disease ([place])     a. Duplex 12/2014: left CEA patent with elevated velocities, 60-79% RICA (stable over serial exams), [id number] LICA stenosis (stable over serial exams), 50% LECA, elevated velocities in bilateral subclavian arteries.  . Hypertensive heart disease with congestive heart failure ([place])   . CKD (chronic kidney disease), stage III   . Tobacco abuse   . Bradycardia     a. Limiting BB titration.  . Anemia, chronic disease 05/16/2008    Qualifier: Diagnosis of  By: Percival Spanish, MD, Farrel Gordon    . Dementia   . Protein-calorie malnutrition ([place]) 01/24/2015   Current Outpatient Prescriptions on File Prior to Visit  Medication Sig Dispense Refill  . acetaminophen (TYLENOL) 500 MG tablet Take 500 mg by mouth every 6 (six) hours as needed (pain).     [name] albuterol (PROVENTIL HFA;VENTOLIN HFA) 108 (90 BASE) MCG/ACT inhaler Inhale 2 puffs into the lungs every 6 (six) hours as needed for wheezing or shortness of breath. 1 Inhaler 0  . amLODipine (NORVASC) 10 MG tablet Take 10 mg by mouth daily.     [name]  aspirin EC 81 MG tablet Take 81 mg by mouth daily.    [name] atorvastatin (LIPITOR) 80 MG tablet Take 1 tablet (80 mg total) by mouth daily at 6 PM. 30 tablet 1  . Calcium Carbonate Antacid (TUMS PO) Take 1 tablet by mouth daily as needed (acid reflux/heartburn).    . carvedilol (COREG) 25 MG tablet Take 1 tablet (25 mg total) by mouth 2 (two) times daily with a meal. 60 tablet 1  . cloNIDine (CATAPRES) 0.1 MG  tablet Take 0.1 mg by mouth 2 (two) times daily.  11  . clopidogrel (PLAVIX) 75 MG tablet Take 1 tablet (75 mg total) by mouth daily. 30 tablet 0  . Ferrous Sulfate (IRON) 325 (65 FE) MG TABS Take 325 mg by mouth 3 (three) times daily.     . fish oil-omega-3 fatty acids 1000 MG capsule Take 1 g by mouth 3 (three) times daily. Three times daily    . furosemide (LASIX) 80 MG tablet Take 0.5 tablets (40 mg total) by mouth 2 (two) times daily. (Patient taking differently: Take 80 mg by mouth 2 (two) times daily. ) 60 tablet 5  . hydrocortisone 2.5 % lotion Apply topically 2 (two) times daily. 59 mL 2  . isosorbide mononitrate (IMDUR) 30 MG 24 hr tablet Take 1 tablet (30 mg total) by mouth daily. 30 tablet 1  . nitroGLYCERIN (NITROSTAT) 0.4 MG SL tablet Place 1 tablet (0.4 mg total) under the tongue every 5 (five) minutes as needed for chest pain. 10 tablet 0  . [DISCONTINUED] loratadine (CLARITIN) 10 MG tablet Take 10 mg by mouth daily.       No current facility-administered medications on file prior to visit.   Family History  Problem Relation Age of Onset  . Hypertension Mother   . Heart attack Mother   . Hypertension Father   . Angina Father   . Cancer Sister   . Cancer Brother   . Stroke Neg Hx    Social History   Social History  . Marital Status: Married    Spouse Name: N/A  . Number of Children: N/A  . Years of Education: N/A   Occupational History  . Not on file.   Social History Main Topics  . Smoking status: Current Every Day Smoker -- 1.00 packs/day for 36 years    Types: Cigarettes  . Smokeless tobacco: Never Used     Comment: uses e-cig  . Alcohol Use: No  . Drug Use: No  . Sexual Activity: No   Other Topics Concern  . Not on file   Social History Narrative    Review of Systems: Constitutional: Negative for fever, chills, diaphoresis, activity change, appetite change and fatigue. HENT: Negative for ear pain, nosebleeds, congestion, facial swelling,  rhinorrhea, neck pain, neck stiffness and ear discharge.  Eyes: Negative for pain, discharge, redness, itching and visual disturbance. Respiratory: Negative for cough, choking, chest tightness, wheezing and stridor, shortness of breath is at baseline. Cardiovascular: Negative for chest pain, palpitations and leg swelling. Gastrointestinal: Negative for abdominal distention. Genitourinary: Negative for dysuria, urgency, frequency, hematuria, flank pain, decreased urine volume, difficulty urinating and dyspareunia.  Musculoskeletal: Negative for back pain, joint swelling, arthralgias and gait problem. Neurological: Negative for dizziness, tremors, seizures, syncope, facial asymmetry, speech difficulty, weakness, light-headedness, numbness and headaches.  Hematological: Negative for adenopathy. Does not bruise/bleed easily. Psychiatric/Behavioral: Negative for hallucinations, behavioral problems, confusion, dysphoric mood, decreased concentration and agitation.    Objective:   Filed Vitals:  02/04/15 1459 02/04/15 1514  BP: 169/53 166/50  Pulse: 50   Temp: 98.3 F (36.8 C)   Resp: 16     Physical Exam: Constitutional: Patient appears well-developed and well-nourished. No distress. HENT: Normocephalic, atraumatic, External right and left ear normal. Oropharynx is clear and moist; currently on 2 L of oxygen via nasal cannula.  Eyes: Conjunctivae and EOM are normal. PERRLA, no scleral icterus. Neck: Normal ROM. Neck supple. No JVD. No tracheal deviation. No thyromegaly. CVS: Bradycardic rate, regular rhythm, [id number] +, [id number] systolic murmurs, no gallops, no carotid bruit.  Pulmonary: Effort and breath sounds normal, no stridor, rhonchi, wheezes, rales.  Abdominal: Soft. BS +,  no distension, tenderness, rebound or guarding.  Musculoskeletal: Normal range of motion. No edema and no tenderness.  Lymphadenopathy: No lymphadenopathy noted, cervical, inguinal or axillary Neuro: Alert. Normal  reflexes, muscle tone coordination. No cranial nerve deficit. Skin: Skin is warm and dry. No rash noted. Not diaphoretic. No erythema. No pallor. Psychiatric: Normal mood and affect. Behavior, judgment, thought content normal.  Lab Results  Component Value Date   WBC 7.3 01/28/2015   HGB 8.6* 01/28/2015   HCT 27.6* 01/28/2015   MCV 92.6 01/28/2015   PLT 259 01/28/2015     Lab Results  Component Value Date   HGBA1C 6.50 02/04/2015   Lipid Panel     Component Value Date/Time   CHOL 261* 01/24/2015 0251   TRIG 274* 01/24/2015 0251   HDL 33* 01/24/2015 0251   CHOLHDL 7.9 01/24/2015 0251   VLDL 55* 01/24/2015 0251   LDLCALC 173* 01/24/2015 0251       Assessment and plan:  Type 2 diabetes mellitus: Controlled with A1c of 6.5. No regimen changes  Hypertension: Uncontrolled. Advised on low sodium, DASH diet and blood pressure will be reassessed at her next visit for elevation in dose of anti-antihypertensive if indicated.  Chronic kidney disease stage III: Advised to avoid nephrotoxic agents. Keep appointment with nephrologist.  Depression and anxiety: Commenced on Zoloft.  Diastolic congestive heart failure: No evidence of fluid overload. Continue 2 L of oxygen at rest. Advised to keep appointment with cardiology.  Tobacco abuse: Spent 3 minutes counseling on cessation and she is working on quitting      Arnoldo Morale, [facility] [phone] 02/06/2015, 1:57 PM

## 2015-02-12 ENCOUNTER — Telehealth: Payer: Self-pay

## 2015-02-12 NOTE — Telephone Encounter (Signed)
This Case Manager placed call to patient to check on status and to remind her of her appointment on 02/17/15 at 1400 with Dr. Jarold Song. Unable to reach patient, HIPPA compliant message left requesting return call.

## 2015-02-13 ENCOUNTER — Telehealth: Payer: Self-pay

## 2015-02-13 NOTE — Telephone Encounter (Signed)
This Case Manager placed call to patient to check status and to remind of upcoming [facility]itional Care follow-up appointment.  Patient reminded she has an appointment on 02/17/15 at 1400 with Dr. Jarold Song. Patient aware of appointment and indicated her daughter would be bringing her to her appointment. Inquired about patient's status. Patient had no complaints or concerns. She indicated she was using oxygen as prescribed and was not experiencing much shortness of breath. She indicated she was taking her medications as prescribed and did not have any medication questions. No additional needs/concerns identified.

## 2015-02-17 ENCOUNTER — Ambulatory Visit: Payer: Medicaid Other | Attending: Family Medicine | Admitting: Family Medicine

## 2015-02-17 ENCOUNTER — Encounter: Payer: Self-pay | Admitting: Family Medicine

## 2015-02-17 VITALS — BP 128/70 | HR 50 | Temp 98.2°F | Resp 13 | Ht <= 58 in | Wt 128.8 lb

## 2015-02-17 DIAGNOSIS — Z9981 Dependence on supplemental oxygen: Secondary | ICD-10-CM | POA: Insufficient documentation

## 2015-02-17 DIAGNOSIS — N183 Chronic kidney disease, stage 3 unspecified: Secondary | ICD-10-CM

## 2015-02-17 DIAGNOSIS — E119 Type 2 diabetes mellitus without complications: Secondary | ICD-10-CM | POA: Insufficient documentation

## 2015-02-17 DIAGNOSIS — F341 Dysthymic disorder: Secondary | ICD-10-CM

## 2015-02-17 DIAGNOSIS — E785 Hyperlipidemia, unspecified: Secondary | ICD-10-CM

## 2015-02-17 DIAGNOSIS — F419 Anxiety disorder, unspecified: Secondary | ICD-10-CM | POA: Diagnosis not present

## 2015-02-17 DIAGNOSIS — R079 Chest pain, unspecified: Secondary | ICD-10-CM | POA: Insufficient documentation

## 2015-02-17 DIAGNOSIS — F329 Major depressive disorder, single episode, unspecified: Secondary | ICD-10-CM | POA: Insufficient documentation

## 2015-02-17 DIAGNOSIS — Z79899 Other long term (current) drug therapy: Secondary | ICD-10-CM | POA: Insufficient documentation

## 2015-02-17 DIAGNOSIS — I259 Chronic ischemic heart disease, unspecified: Secondary | ICD-10-CM | POA: Diagnosis not present

## 2015-02-17 DIAGNOSIS — Z7982 Long term (current) use of aspirin: Secondary | ICD-10-CM | POA: Diagnosis not present

## 2015-02-17 DIAGNOSIS — J449 Chronic obstructive pulmonary disease, unspecified: Secondary | ICD-10-CM | POA: Insufficient documentation

## 2015-02-17 DIAGNOSIS — D509 Iron deficiency anemia, unspecified: Secondary | ICD-10-CM | POA: Diagnosis not present

## 2015-02-17 DIAGNOSIS — I10 Essential (primary) hypertension: Secondary | ICD-10-CM | POA: Insufficient documentation

## 2015-02-17 DIAGNOSIS — I5032 Chronic diastolic (congestive) heart failure: Secondary | ICD-10-CM | POA: Diagnosis not present

## 2015-02-17 DIAGNOSIS — I129 Hypertensive chronic kidney disease with stage 1 through stage 4 chronic kidney disease, or unspecified chronic kidney disease: Secondary | ICD-10-CM | POA: Insufficient documentation

## 2015-02-17 DIAGNOSIS — I25110 Atherosclerotic heart disease of native coronary artery with unstable angina pectoris: Secondary | ICD-10-CM | POA: Diagnosis not present

## 2015-02-17 LAB — GLUCOSE, POCT (MANUAL RESULT ENTRY): POC Glucose: 204 mg/dl — AB (ref 70–99)

## 2015-02-17 LAB — BASIC METABOLIC PANEL
BUN: 31 mg/dL — AB (ref 7–25)
CHLORIDE: 100 mmol/L (ref 98–110)
CO2: 33 mmol/L — AB (ref 20–31)
Calcium: 9.6 mg/dL (ref 8.6–10.4)
Creat: 1.47 mg/dL — ABNORMAL HIGH (ref 0.50–0.99)
Glucose, Bld: 181 mg/dL — ABNORMAL HIGH (ref 65–99)
POTASSIUM: 4.4 mmol/L (ref 3.5–5.3)
Sodium: 139 mmol/L (ref 135–146)

## 2015-02-17 MED ORDER — FLUTICASONE FUROATE-VILANTEROL 100-25 MCG/INH IN AEPB: 1.0000 | INHALATION_SPRAY | Freq: Every day | RESPIRATORY_TRACT | Status: DC

## 2015-02-17 MED ORDER — ISOSORBIDE MONONITRATE ER 30 MG PO TB24: 30.0000 mg | ORAL_TABLET | Freq: Every day | ORAL | Status: DC

## 2015-02-17 MED ORDER — CLOPIDOGREL BISULFATE 75 MG PO TABS: 75.0000 mg | ORAL_TABLET | Freq: Every day | ORAL | Status: DC

## 2015-02-17 MED ORDER — GLUCOSE BLOOD VI STRP: ORAL_STRIP | Status: DC

## 2015-02-17 MED ORDER — IRON 325 (65 FE) MG PO TABS: 325.0000 mg | ORAL_TABLET | Freq: Three times a day (TID) | ORAL | Status: DC

## 2015-02-17 MED ORDER — AMLODIPINE BESYLATE 10 MG PO TABS: 10.0000 mg | ORAL_TABLET | Freq: Every day | ORAL | Status: DC

## 2015-02-17 MED ORDER — ALBUTEROL SULFATE HFA 108 (90 BASE) MCG/ACT IN AERS: 2.0000 | INHALATION_SPRAY | Freq: Four times a day (QID) | RESPIRATORY_TRACT | Status: DC | PRN

## 2015-02-17 MED ORDER — TRUEPLUS LANCETS 28G MISC: 1.0000 | Freq: Every day | Status: DC

## 2015-02-17 MED ORDER — CARVEDILOL 25 MG PO TABS: 25.0000 mg | ORAL_TABLET | Freq: Two times a day (BID) | ORAL | Status: DC

## 2015-02-17 MED ORDER — CLONIDINE HCL 0.1 MG PO TABS: 0.1000 mg | ORAL_TABLET | Freq: Two times a day (BID) | ORAL | Status: DC

## 2015-02-17 MED ORDER — FUROSEMIDE 80 MG PO TABS: 80.0000 mg | ORAL_TABLET | Freq: Two times a day (BID) | ORAL | Status: DC

## 2015-02-17 MED ORDER — TRUE METRIX METER DEVI: 1.0000 | Freq: Every day | Status: DC

## 2015-02-17 MED ORDER — ASPIRIN EC 81 MG PO TBEC: 81.0000 mg | DELAYED_RELEASE_TABLET | Freq: Every day | ORAL | Status: DC

## 2015-02-17 MED ORDER — ATORVASTATIN CALCIUM 80 MG PO TABS: 80.0000 mg | ORAL_TABLET | Freq: Every day | ORAL | Status: DC

## 2015-02-17 NOTE — Progress Notes (Signed)
Patient reports no concerns today She is not sure if she needs refills on her medications today

## 2015-02-17 NOTE — Progress Notes (Signed)
Subjective:  Patient ID: [name], female    DOB: Jun 23, 1950  Age: 64 y.o. MRN: [id number]  CC: Follow-up   HPI [name] is a 64 year old female with a history of type 2 diabetes mellitus, hypertension, COPD, diastolic CHF on chronic oxygen use, stage III chronic kidney disease who was recently hospitalized for coronary artery disease with recommendations for medical management after she had presented to the ED with chest pain which improved with nitroglycerin and underwent a stress test which revealed findings persistent with ischemia. She was taken to the Cath Lab with cardiac cath results revealing multivessel disease: Ost RCA to Prox RCA lesion, 99% stenosed, Prox RCA to Mid RCA lesion, 100% stenosed, Ost LAD to Prox LAD lesion, 70% stenosed, LM lesion, 50% stenosed, Mid Cx to Dist Cx lesion, 90% stenosed. Medical management was recommended to with outpatient follow-up with cardiology and thoracic surgery.  At her last office visit she had requested portable oxygen for which the social worker was contacted we have been unable to assist her with this as she currently does not have medical coverage and so has been using her big tank but hopefully when her Medicare kicks in in 3 months we should be able to get this for her. Zoloft was also commenced for depression and she has been taking this for the last 2 weeks She has no complaints today but endorses using her Inhaler several times a day. She is scheduled to see a cardiologist sometime next week  Outpatient Prescriptions Prior to Visit  Medication Sig Dispense Refill  . acetaminophen (TYLENOL) 500 MG tablet Take 500 mg by mouth every 6 (six) hours as needed (pain).     . Calcium Carbonate Antacid (TUMS PO) Take 1 tablet by mouth daily as needed (acid reflux/heartburn).    . fish oil-omega-3 fatty acids 1000 MG capsule Take 1 g by mouth 3 (three) times daily. Three times daily    . hydrocortisone 2.5 % lotion Apply topically 2  (two) times daily. 59 mL 2  . nitroGLYCERIN (NITROSTAT) 0.4 MG SL tablet Place 1 tablet (0.4 mg total) under the tongue every 5 (five) minutes as needed for chest pain. 10 tablet 0  . sertraline (ZOLOFT) 50 MG tablet Take 1 tablet (50 mg total) by mouth daily. 30 tablet 3  . albuterol (PROVENTIL HFA;VENTOLIN HFA) 108 (90 BASE) MCG/ACT inhaler Inhale 2 puffs into the lungs every 6 (six) hours as needed for wheezing or shortness of breath. 1 Inhaler 0  . amLODipine (NORVASC) 10 MG tablet Take 10 mg by mouth daily.     [name] aspirin EC 81 MG tablet Take 81 mg by mouth daily.    [name] atorvastatin (LIPITOR) 80 MG tablet Take 1 tablet (80 mg total) by mouth daily at 6 PM. 30 tablet 1  . carvedilol (COREG) 25 MG tablet Take 1 tablet (25 mg total) by mouth 2 (two) times daily with a meal. 60 tablet 1  . cloNIDine (CATAPRES) 0.1 MG tablet Take 0.1 mg by mouth 2 (two) times daily.  11  . clopidogrel (PLAVIX) 75 MG tablet Take 1 tablet (75 mg total) by mouth daily. 30 tablet 0  . Ferrous Sulfate (IRON) 325 (65 FE) MG TABS Take 325 mg by mouth 3 (three) times daily.     . furosemide (LASIX) 80 MG tablet Take 0.5 tablets (40 mg total) by mouth 2 (two) times daily. (Patient taking differently: Take 80 mg by mouth 2 (two) times daily. )  60 tablet 5  . isosorbide mononitrate (IMDUR) 30 MG 24 hr tablet Take 1 tablet (30 mg total) by mouth daily. 30 tablet 1   No facility-administered medications prior to visit.    ROS Review of Systems Constitutional: Negative for fever, chills, diaphoresis, activity change, appetite change and fatigue. HENT: Negative for ear pain, nosebleeds, congestion, facial swelling, rhinorrhea, neck pain, neck stiffness and ear discharge.  Eyes: Negative for pain, discharge, redness, itching and visual disturbance. Respiratory: Negative for cough, choking, chest tightness, wheezing and stridor, shortness of breath is at baseline. Cardiovascular: Negative for chest pain, palpitations and leg  swelling. Gastrointestinal: Negative for abdominal distention. Genitourinary: Negative for dysuria, urgency, frequency, hematuria, flank pain, decreased urine volume, difficulty urinating and dyspareunia.  Musculoskeletal: Negative for back pain, joint swelling, arthralgias and gait problem. Neurological: Negative for dizziness, tremors, seizures, syncope, facial asymmetry, speech difficulty, weakness, light-headedness, numbness and headaches.  Hematological: Negative for adenopathy. Does not bruise/bleed easily. Psychiatric/Behavioral: Negative for hallucinations, behavioral problems, confusion, dysphoric mood, decreased concentration and agitation.   Objective:  BP 128/70 mmHg  Pulse 50  Temp(Src) 98.2 F (36.8 C)  Resp 13  Ht 4\' 10"  (1.473 m)  Wt 128 lb 12.8 oz (58.423 kg)  BMI 26.93 kg/m2  SpO2 97%  BP/Weight 02/17/2015 02/04/2015 [id number]  Systolic BP 0000000 [id number]  Diastolic BP 70 50 54  Wt. (Lbs) 128.8 137 138.8  BMI 26.93 28.64 29.02    Lab Results  Component Value Date   WBC 7.3 01/28/2015   HGB 8.6* 01/28/2015   HCT 27.6* 01/28/2015   PLT 259 01/28/2015   GLUCOSE 108* 02/04/2015   CHOL 261* 01/24/2015   TRIG 274* 01/24/2015   HDL 33* 01/24/2015   LDLDIRECT 133.6 05/17/2012   LDLCALC 173* 01/24/2015   ALT 9* 01/24/2015   AST 12* 01/24/2015   NA 145 02/04/2015   K 5.3 02/04/2015   CL 109 02/04/2015   CREATININE 1.33* 02/04/2015   BUN 37* 02/04/2015   CO2 25 02/04/2015   TSH 2.015 12/24/2014   INR 1.10 01/24/2015   HGBA1C 6.50 02/04/2015   MICROALBUR 140.3 02/04/2015    Physical Exam Constitutional: Patient appears well-developed and well-nourished. No distress. HENT: Normocephalic, atraumatic, External right and left ear normal. Oropharynx is clear and moist; currently on 2 L of oxygen via nasal cannula.  Eyes: Conjunctivae and EOM are normal. PERRLA, no scleral icterus. Neck: Normal ROM. Neck supple. No JVD. No tracheal deviation. No thyromegaly. CVS:  Bradycardic rate, regular rhythm, [id number] +, [id number] systolic murmurs, no gallops, no carotid bruit.  Pulmonary: Effort and breath sounds normal, no stridor, rhonchi, wheezes, rales.  Abdominal: Soft. BS +,  no distension, tenderness, rebound or guarding.  Musculoskeletal: Normal range of motion. No edema and no tenderness.  Lymphadenopathy: No lymphadenopathy noted, cervical, inguinal or axillary Neuro: Alert. Normal reflexes, muscle tone coordination. No cranial nerve deficit. Skin: Skin is warm and dry. No rash noted. Not diaphoretic. No erythema. No pallor. Psychiatric: Normal mood and affect. Behavior, judgment, thought content normal.  Assessment & Plan:   1. Type 2 diabetes mellitus without complication, without long-term current use of insulin (HCC) Diet controlled with A1c of 6.5 - Glucose (CBG) - Basic Metabolic Panel - glucose blood (TRUE METRIX BLOOD GLUCOSE TEST) test strip; Uses once daily as directed  Dispense: 30 each; Refill: 5 - Blood Glucose Monitoring Suppl (TRUE METRIX METER) DEVI; Inject 1 each into the skin daily before breakfast.  Dispense: 1 Device; Refill: 0 - TRUEPLUS LANCETS  28G MISC; Inject 1 each into the skin daily before breakfast.  Dispense: 30 each; Refill: 5  2. Chronic obstructive pulmonary disease, unspecified COPD type ([place]) Uncontrolled as she is having to use her Proventil inhaler several times a day and so I will place her on a long-acting medication - Fluticasone Furoate-Vilanterol (BREO ELLIPTA) 100-25 MCG/INH AEPB; Inhale 1 Inhaler into the lungs daily.  Dispense: 1 each; Refill: 2 - albuterol (PROVENTIL HFA;VENTOLIN HFA) 108 (90 BASE) MCG/ACT inhaler; Inhale 2 puffs into the lungs every 6 (six) hours as needed for wheezing or shortness of breath.  Dispense: 1 Inhaler; Refill: 2  3. ANXIETY DEPRESSION Commenced on SSRI 2 weeks ago and onset of action is about 3-4 weeks. We'll reassess at next visit for improvement meanwhile keep appointment with  LCSW  4. CKD (chronic kidney disease), stage III Stable. Avoid nephrotoxic agents. Keep appointments with [facility]  5. Coronary artery disease involving native coronary artery of native heart with unstable angina pectoris (HCC) Risk factor modification - aspirin EC 81 MG tablet; Take 1 tablet (81 mg total) by mouth daily.  Dispense: 30 tablet; Refill: 2 - carvedilol (COREG) 25 MG tablet; Take 1 tablet (25 mg total) by mouth 2 (two) times daily with a meal.  Dispense: 60 tablet; Refill: 2 - clopidogrel (PLAVIX) 75 MG tablet; Take 1 tablet (75 mg total) by mouth daily.  Dispense: 30 tablet; Refill: 2  6. Chronic diastolic CHF (congestive heart failure) (HCC) Euvolemic at this time. Continue 2 L of oxygen at rest - carvedilol (COREG) 25 MG tablet; Take 1 tablet (25 mg total) by mouth 2 (two) times daily with a meal.  Dispense: 60 tablet; Refill: 2 - furosemide (LASIX) 80 MG tablet; Take 1 tablet (80 mg total) by mouth 2 (two) times daily.  Dispense: 60 tablet; Refill: 2 - isosorbide mononitrate (IMDUR) 30 MG 24 hr tablet; Take 1 tablet (30 mg total) by mouth daily.  Dispense: 30 tablet; Refill: 2  7. Essential hypertension Controlled - amLODipine (NORVASC) 10 MG tablet; Take 1 tablet (10 mg total) by mouth daily.  Dispense: 30 tablet; Refill: 2 - cloNIDine (CATAPRES) 0.1 MG tablet; Take 1 tablet (0.1 mg total) by mouth 2 (two) times daily.  Dispense: 60 tablet; Refill: 2  8. Iron deficiency anemia Stable - Ferrous Sulfate (IRON) 325 (65 FE) MG TABS; Take 325 mg by mouth 3 (three) times daily.  Dispense: 90 each; Refill: 2  9. Hyperlipidemia Controlled - atorvastatin (LIPITOR) 80 MG tablet; Take 1 tablet (80 mg total) by mouth daily at 6 PM.  Dispense: 30 tablet; Refill: 2   Meds ordered this encounter  Medications  . glucose blood (TRUE METRIX BLOOD GLUCOSE TEST) test strip    Sig: Uses once daily as directed    Dispense:  30 each    Refill:  5  . Blood Glucose Monitoring  Suppl (TRUE METRIX METER) DEVI    Sig: Inject 1 each into the skin daily before breakfast.    Dispense:  1 Device    Refill:  0  . TRUEPLUS LANCETS 28G MISC    Sig: Inject 1 each into the skin daily before breakfast.    Dispense:  30 each    Refill:  5  . Fluticasone Furoate-Vilanterol (BREO ELLIPTA) 100-25 MCG/INH AEPB    Sig: Inhale 1 Inhaler into the lungs daily.    Dispense:  1 each    Refill:  2  . albuterol (PROVENTIL HFA;VENTOLIN HFA) 108 (90 BASE) MCG/ACT inhaler  Sig: Inhale 2 puffs into the lungs every 6 (six) hours as needed for wheezing or shortness of breath.    Dispense:  1 Inhaler    Refill:  2  . amLODipine (NORVASC) 10 MG tablet    Sig: Take 1 tablet (10 mg total) by mouth daily.    Dispense:  30 tablet    Refill:  2  . aspirin EC 81 MG tablet    Sig: Take 1 tablet (81 mg total) by mouth daily.    Dispense:  30 tablet    Refill:  2  . atorvastatin (LIPITOR) 80 MG tablet    Sig: Take 1 tablet (80 mg total) by mouth daily at 6 PM.    Dispense:  30 tablet    Refill:  2  . carvedilol (COREG) 25 MG tablet    Sig: Take 1 tablet (25 mg total) by mouth 2 (two) times daily with a meal.    Dispense:  60 tablet    Refill:  2  . cloNIDine (CATAPRES) 0.1 MG tablet    Sig: Take 1 tablet (0.1 mg total) by mouth 2 (two) times daily.    Dispense:  60 tablet    Refill:  2  . clopidogrel (PLAVIX) 75 MG tablet    Sig: Take 1 tablet (75 mg total) by mouth daily.    Dispense:  30 tablet    Refill:  2  . Ferrous Sulfate (IRON) 325 (65 FE) MG TABS    Sig: Take 325 mg by mouth 3 (three) times daily.    Dispense:  90 each    Refill:  2  . furosemide (LASIX) 80 MG tablet    Sig: Take 1 tablet (80 mg total) by mouth 2 (two) times daily.    Dispense:  60 tablet    Refill:  2  . isosorbide mononitrate (IMDUR) 30 MG 24 hr tablet    Sig: Take 1 tablet (30 mg total) by mouth daily.    Dispense:  30 tablet    Refill:  2    Follow-up: Return in about 6 weeks (around 03/31/2015)  for follow up of COPD.   Arnoldo Morale MD

## 2015-02-18 ENCOUNTER — Telehealth: Payer: Self-pay

## 2015-02-18 NOTE — Telephone Encounter (Signed)
CMA called patient, patient didn't answer. Patient was left a message to return my call asap.

## 2015-02-18 NOTE — Telephone Encounter (Signed)
-----   Message from Arnoldo Morale, MD sent at 02/18/2015  8:45 AM EST ----- Labs are stable

## 2015-02-20 ENCOUNTER — Encounter: Payer: Self-pay | Admitting: Cardiology

## 2015-02-20 ENCOUNTER — Ambulatory Visit (INDEPENDENT_AMBULATORY_CARE_PROVIDER_SITE_OTHER): Payer: Self-pay | Admitting: Cardiology

## 2015-02-20 VITALS — BP 132/46 | HR 47 | Ht <= 58 in | Wt 121.4 lb

## 2015-02-20 DIAGNOSIS — I25110 Atherosclerotic heart disease of native coronary artery with unstable angina pectoris: Secondary | ICD-10-CM

## 2015-02-20 DIAGNOSIS — N183 Chronic kidney disease, stage 3 unspecified: Secondary | ICD-10-CM

## 2015-02-20 DIAGNOSIS — I10 Essential (primary) hypertension: Secondary | ICD-10-CM

## 2015-02-20 DIAGNOSIS — I2510 Atherosclerotic heart disease of native coronary artery without angina pectoris: Secondary | ICD-10-CM

## 2015-02-20 DIAGNOSIS — I739 Peripheral vascular disease, unspecified: Secondary | ICD-10-CM

## 2015-02-20 DIAGNOSIS — I5032 Chronic diastolic (congestive) heart failure: Secondary | ICD-10-CM

## 2015-02-20 MED ORDER — FUROSEMIDE 80 MG PO TABS: 80.0000 mg | ORAL_TABLET | Freq: Every day | ORAL | Status: DC

## 2015-02-20 MED ORDER — CARVEDILOL 12.5 MG PO TABS: 12.5000 mg | ORAL_TABLET | Freq: Two times a day (BID) | ORAL | Status: DC

## 2015-02-20 NOTE — Progress Notes (Signed)
Cardiology Office Note   Date:  02/20/2015   ID:  [name], DOB [birth year], MRN [id number]  PCP:  [name], MD/ Dr. Jarold Song  Cardiologist:  Dr. Percival Spanish    Chief Complaint  Patient presents with  . Hospitalization Follow-up    CATH//pt c/o chills, swellin in bilateral legs, feet, ankles--happens about every 2 days, LASIX helps,  . Shortness of Breath    on exertion and occasionally when lying down      History of Present Illness: [name] is a 64 y.o. female who presents for post hospital for chest pain.  She had abnormal nuc study with ischemia. EF 52%.  Negative MI.   She has a history of CAD (NSTEMI 2007 in setting of anemia; LHC 50% LM, 60% LAD, 30% RI, occluded RCA with L-R collaterals, normal EF, treated medically), chronic diastolic CHF, CKD stage III, tobacco abuse, PVD s/p left common iliac and external iliac artery stents as well as mesenteric/celiac/subclavian stenosis by duplexes below, renal artery stenosis, carotid disease s/p left carotid endarterectomy ~2000, bradycardia limiting BB titration, DM, COPD, HLD, HTN, GI AVM s/p cauterization, iron deficiency anemia, ?possible hx of TIA (in problem list by Dr. Percival Spanish) and scabies.   She had cardiac cath 01/23/15 with significant disease.  See below.  TCTS consult but with pulmonary issues and Renal concerns medical management was planned.  No ACE /ARB due to chronic renal insuff. D/c'd 01/28/15 to home.  Since discharge she has no chest pain, no SOB.  She does have some dizziness, and today HR is 47.  No syncope.  Appetite is not great.   Past Medical History  Diagnosis Date  . Peripheral vascular disease ([place])     a.s /p left common iliac and external iliac artery stents. b. H/o subclavian, mesenteric, and celiac artery stenosis (see below).  . Diabetes mellitus   . COPD (chronic obstructive pulmonary disease) ([facility])     home oxygen  . Hyperlipidemia   . Depression   . Chronic diastolic CHF  (congestive heart failure) ([place])   . Essential hypertension   . Renal artery stenosis ([place])     a. Duplex 2014: no obvious evidence of hemodynamically significant stenosis >60%, bilateral intrarenal arteries exhibit absent diastolic flow. There is evidence of elevated velocities of the mid aorta. 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  [name] CAD (coronary artery disease)   . Iron deficiency anemia   . Diabetic retinopathy ([place])   . 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 Carotid artery disease ([place])     a. Duplex 12/2014: left CEA patent with elevated velocities, 60-79% RICA (stable over serial exams), 123456 LICA stenosis (stable over serial exams), 50% LECA, elevated velocities in bilateral subclavian arteries.  . Hypertensive heart disease with congestive heart failure ([place])   . CKD (chronic kidney disease), stage III   . Tobacco abuse   . Bradycardia     a. Limiting BB titration.  . Anemia, chronic disease 05/16/2008    Qualifier: Diagnosis of  By: Percival Spanish, MD, Farrel Gordon    . Dementia   . Protein-calorie malnutrition ([place]) 01/24/2015    Past Surgical History  Procedure Laterality Date  . Carotid endarterectomy    . Cholecystectomy    . Appendectomy    . Tonsillectomy    . Cesarean section    . Shoulder arthroscopy    .  Iliac artery stent      left  . Enteroscopy  01/26/2012    Procedure: ENTEROSCOPY;  Surgeon: Gatha Mayer, MD;  Location: [facility];  Service: Endoscopy;  Laterality: N/A;  . Cardiac catheterization N/A 01/23/2015    Procedure: Left Heart Cath and Coronary Angiography;  Surgeon: [name], MD;  Location: [facility];  Service: Cardiovascular;  Laterality: N/A;     Current Outpatient Prescriptions  Medication Sig Dispense Refill  . acetaminophen (TYLENOL) 500 MG tablet Take 500 mg by mouth every 6  (six) hours as needed (pain).     [name] albuterol (PROVENTIL HFA;VENTOLIN HFA) 108 (90 BASE) MCG/ACT inhaler Inhale 2 puffs into the lungs every 6 (six) hours as needed for wheezing or shortness of breath. 1 Inhaler 2  . amLODipine (NORVASC) 10 MG tablet Take 1 tablet (10 mg total) by mouth daily. 30 tablet 2  . aspirin EC 81 MG tablet Take 1 tablet (81 mg total) by mouth daily. 30 tablet 2  . atorvastatin (LIPITOR) 80 MG tablet Take 1 tablet (80 mg total) by mouth daily at 6 PM. 30 tablet 2  . Blood Glucose Monitoring Suppl (TRUE METRIX METER) DEVI Inject 1 each into the skin daily before breakfast. 1 Device 0  . Calcium Carbonate Antacid (TUMS PO) Take 1 tablet by mouth daily as needed (acid reflux/heartburn).    . carvedilol (COREG) 25 MG tablet Take 1 tablet (25 mg total) by mouth 2 (two) times daily with a meal. 60 tablet 2  . cloNIDine (CATAPRES) 0.1 MG tablet Take 1 tablet (0.1 mg total) by mouth 2 (two) times daily. 60 tablet 2  . clopidogrel (PLAVIX) 75 MG tablet Take 1 tablet (75 mg total) by mouth daily. 30 tablet 2  . Ferrous Sulfate (IRON) 325 (65 FE) MG TABS Take 325 mg by mouth 3 (three) times daily. 90 each 2  . fish oil-omega-3 fatty acids 1000 MG capsule Take 1 g by mouth 3 (three) times daily. Three times daily    . Fluticasone Furoate-Vilanterol (BREO ELLIPTA) 100-25 MCG/INH AEPB Inhale 1 Inhaler into the lungs daily. 1 each 2  . furosemide (LASIX) 80 MG tablet Take 1 tablet (80 mg total) by mouth 2 (two) times daily. 60 tablet 2  . glucose blood (TRUE METRIX BLOOD GLUCOSE TEST) test strip Uses once daily as directed 30 each 5  . hydrocortisone 2.5 % lotion Apply topically 2 (two) times daily. 59 mL 2  . isosorbide mononitrate (IMDUR) 30 MG 24 hr tablet Take 1 tablet (30 mg total) by mouth daily. 30 tablet 2  . KIONEX 15 GM/60ML suspension Take 1 Dose by mouth as needed.  0  . nitroGLYCERIN (NITROSTAT) 0.4 MG SL tablet Place 1 tablet (0.4 mg total) under the tongue every 5 (five)  minutes as needed for chest pain. 10 tablet 0  . permethrin (ELIMITE) 5 % cream Apply 1 application topically as needed.  0  . sertraline (ZOLOFT) 50 MG tablet Take 1 tablet (50 mg total) by mouth daily. 30 tablet 3  . TRUEPLUS LANCETS 28G MISC Inject 1 each into the skin daily before breakfast. 30 each 5  . [DISCONTINUED] loratadine (CLARITIN) 10 MG tablet Take 10 mg by mouth daily.       No current facility-administered medications for this visit.    Allergies:   Review of patient's allergies indicates no known allergies.    Social History:  The patient  reports that she quit smoking 9 days ago.  Her smoking use included Cigarettes. She smoked 0.00 packs per day for 36 years. She has never used smokeless tobacco. She reports that she does not drink alcohol or use illicit drugs.   Family History:  The patient's family history includes Angina in her father; Cancer in her brother and sister; Heart attack in her mother; Hypertension in her father and mother. There is no history of Stroke.    ROS:  General:no colds or fevers, no weight changes Skin:no rashes or ulcers HEENT:no blurred vision, no congestion CV:see HPI PUL:see HPI GI:no diarrhea constipation or melena, no indigestion GU:no hematuria, no dysuria MS:no joint pain, no claudication Neuro:no syncope, + lightheadedness Endo:+  diabetes, no thyroid disease  Wt Readings from Last 3 Encounters:  02/20/15 121 lb 6.4 oz (55.067 kg)  02/17/15 128 lb 12.8 oz (58.423 kg)  02/04/15 137 lb (62.143 kg)     PHYSICAL EXAM: VS:  BP 132/46 mmHg  Pulse 47  Ht 4\' 10"  (1.473 m)  Wt 121 lb 6.4 oz (55.067 kg)  BMI 25.38 kg/m2 , BMI Body mass index is 25.38 kg/(m^2). General:Pleasant affect, NAD with oxygen Skin:Warm and dry, brisk capillary refill HEENT:normocephalic, sclera clear, mucus membranes moist Neck:supple, no JVD +carotid bruits  Heart:S1S2 RRR with [id number] systolic murmur, no gallup, rub or click Lungs:clear without rales,  rhonchi, or wheezes-mildly diminished [id number], non tender, + BS, do not palpate liver spleen or masses Ext:no lower ext edema, 2+ pedal pulses, 2+ radial pulses Neuro:alert and oriented X 3, MAE, follows commands, + facial symmetry    EKG:  EKG is ordered today. The ekg ordered today demonstrates SB at 21 ST T wave abnormality but no acute changes, but HR is slower..    Recent Labs: 12/24/2014: TSH 2.015 01/21/2015: B Natriuretic Peptide 833.4* 01/24/2015: ALT 9* 01/28/2015: Hemoglobin 8.6*; Platelets 259 02/17/2015: BUN 31*; Creat 1.47*; Potassium 4.4; Sodium 139    Lipid Panel    Component Value Date/Time   CHOL 261* 01/24/2015 0251   TRIG 274* 01/24/2015 0251   HDL 33* 01/24/2015 0251   CHOLHDL 7.9 01/24/2015 0251   VLDL 55* 01/24/2015 0251   LDLCALC 173* 01/24/2015 0251   LDLDIRECT 133.6 05/17/2012 0846       Other studies Reviewed: Additional studies/ records that were reviewed today include: . Last carotid duplex 112016: stable left CEA patent with elevated velocities, 60-79% RICA (stable over serial exams), 123456 LICA stenosis (stable over serial exams), 50% LECA, elevated velocities in bilateral subclavian arteries.) repeat in 1 year.   - Renal duplex 2014: no obvious evidence of hemodynamically significant stenosis >60%, bilateral intrarenal arteries exhibit absent diastolic flow. There is evidence of elevated velocities of the mid aorta. 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  --Echo 10/2014 - Left ventricle: The cavity size was normal. Wall thickness was increased in a pattern of mild LVH. Systolic function was normal. The estimated ejection fraction was in the range of 55% to 60%. Wall motion was normal; there were no regional wall motion abnormalities. Findings consistent with left ventricular diastolic dysfunction. - Aortic valve: Sclerosis without stenosis. There was no significant regurgitation. - Mitral valve:  After careful review of all data, I feel there is no definite mitral stenosis. There was trivial regurgitation. - Left atrium: The atrium was mildly dilated. - Right ventricle: The cavity size was mildly dilated. Systolic function was normal. - Right atrium: The atrium was mildly dilated. - Tricuspid valve: There was mild regurgitation. - Pulmonary arteries: PA peak  pressure: 57 mm Hg (   Cardiac cath: Conclusion     Ost RCA to Prox RCA lesion, 99% stenosed.  Prox RCA to Mid RCA lesion, 100% stenosed.  Ost LAD to Prox LAD lesion, 70% stenosed.  LM lesion, 50% stenosed.  Mid Cx to Dist Cx lesion, 90% stenosed.  Severe three vessel CAD.  Elevated LVEDP.  15 mm Hg across the right subclavian stenosis.     ECHO 01/24/15 Study Conclusions  - Left ventricle: The cavity size was normal. There was mild concentric hypertrophy. Systolic function was normal. The estimated ejection fraction was in the range of 60% to 65%. Wall motion was normal; there were no regional wall motion abnormalities. Features are consistent with a pseudonormal left ventricular filling pattern, with concomitant abnormal relaxation and increased filling pressure (grade 2 diastolic dysfunction). - Mitral valve: Mildly to moderately calcified annulus. There was moderate regurgitation. Valve area by pressure half-time: 1.16 cm^2. Valve area by continuity equation (using LVOT flow): 1.69 cm^2. - Left atrium: The atrium was mildly dilated. - Right atrium: The atrium was mildly dilated. - Pulmonary arteries: Systolic pressure was mildly increased. PA peak pressure: 40 mm Hg (S).  ASSESSMENT AND PLAN:  1.  Significant CAD treated medically due to co morbidities. No chest pain or SOB. Continue to follow closely for now.  See me back in 2 weeks.  2.  Sinus brady at 47-symptomatic. Will decrease coreg to 12.5 bid  3. COPD on oxygen stable currently  4.  CKD-3-4 no ACE/ARB to see  Dr. Florene Glen in next few weeks.  5. Diastolic HF wt is down 17 lbs since discharge.  Will decrease lasix to once daily with prn 80 mg in evening if wt increases by 3 lbs in a day   6. Hyperlipidemia on lipitor 80 recehck lipids in 6 weeks with hepatic.   7. Carotid disease, follow every 6 months.    Current medicines are reviewed with the patient today.  The patient Has no concerns regarding medicines.  The following changes have been made:  See above Labs/ tests ordered today include:see above  Disposition:   FU:  see above  Signed, Isaiah Serge, NP  02/20/2015 9:47 AM    [facility], [place], [place] [place], [place] Phone: [phone]; Fax: [phone]

## 2015-02-20 NOTE — Patient Instructions (Addendum)
Weigh daily Call [phone] if weight climbs more than 3 pounds in a day or 5 pounds in a week. No salt to very little salt in your diet.  No more than 2000 mg in a day. Call if increased shortness of breath or increased swelling. If you gain as above take an extra lasix in the evenings other wise I am decreasing to one daily.     Medication Instructions:  1. DECREASE LASIX TO 80 MG DAILY  2. DECREASE COREG TO 12.5 MG 1 TABLET TWICE DAILY  3. TAKE EXTRA LASIX 80 MG ONLY AS NEEDED WHEN WEIGHT IS UP 3 LB'S OVERNIGHT OR MORE SHORTNESS OF BREATH OR SWELLING    Labwork: NONE  Testing/Procedures: NONE  Follow-Up: 03/06/15 @ 11:30 WITH Cecilie Kicks, PAC  Any Other Special Instructions Will Be Listed Below (If Applicable).   If you need a refill on your cardiac medications before your next appointment, please call your pharmacy.

## 2015-02-24 NOTE — Telephone Encounter (Signed)
CMA called patient, patient did not answer. This is the 2nd attempt. Letter will be sent to patients address on file, asking patient to call the office upon receiving letter.

## 2015-03-04 ENCOUNTER — Emergency Department (HOSPITAL_COMMUNITY): Payer: Medicaid Other

## 2015-03-04 ENCOUNTER — Inpatient Hospital Stay (HOSPITAL_COMMUNITY)
Admission: EM | Admit: 2015-03-04 | Discharge: 2015-03-12 | DRG: 682 | Disposition: A | Discharge status: Skilled Nursing Facility | Payer: Medicaid Other | Attending: Internal Medicine | Admitting: Internal Medicine

## 2015-03-04 ENCOUNTER — Encounter (HOSPITAL_COMMUNITY): Payer: Self-pay

## 2015-03-04 DIAGNOSIS — E870 Hyperosmolality and hypernatremia: Secondary | ICD-10-CM

## 2015-03-04 DIAGNOSIS — Z7902 Long term (current) use of antithrombotics/antiplatelets: Secondary | ICD-10-CM

## 2015-03-04 DIAGNOSIS — D72829 Elevated white blood cell count, unspecified: Secondary | ICD-10-CM

## 2015-03-04 DIAGNOSIS — I701 Atherosclerosis of renal artery: Secondary | ICD-10-CM

## 2015-03-04 DIAGNOSIS — Z6828 Body mass index (BMI) 28.0-28.9, adult: Secondary | ICD-10-CM

## 2015-03-04 DIAGNOSIS — N183 Chronic kidney disease, stage 3 unspecified: Secondary | ICD-10-CM

## 2015-03-04 DIAGNOSIS — E872 Acidosis: Secondary | ICD-10-CM | POA: Diagnosis present

## 2015-03-04 DIAGNOSIS — D638 Anemia in other chronic diseases classified elsewhere: Secondary | ICD-10-CM

## 2015-03-04 DIAGNOSIS — E11319 Type 2 diabetes mellitus with unspecified diabetic retinopathy without macular edema: Secondary | ICD-10-CM | POA: Diagnosis present

## 2015-03-04 DIAGNOSIS — Z8249 Family history of ischemic heart disease and other diseases of the circulatory system: Secondary | ICD-10-CM

## 2015-03-04 DIAGNOSIS — I10 Essential (primary) hypertension: Secondary | ICD-10-CM

## 2015-03-04 DIAGNOSIS — N170 Acute kidney failure with tubular necrosis: Principal | ICD-10-CM | POA: Diagnosis present

## 2015-03-04 DIAGNOSIS — D509 Iron deficiency anemia, unspecified: Secondary | ICD-10-CM

## 2015-03-04 DIAGNOSIS — R001 Bradycardia, unspecified: Secondary | ICD-10-CM

## 2015-03-04 DIAGNOSIS — F341 Dysthymic disorder: Secondary | ICD-10-CM

## 2015-03-04 DIAGNOSIS — N179 Acute kidney failure, unspecified: Secondary | ICD-10-CM

## 2015-03-04 DIAGNOSIS — Z8673 Personal history of transient ischemic attack (TIA), and cerebral infarction without residual deficits: Secondary | ICD-10-CM

## 2015-03-04 DIAGNOSIS — Q2730 Arteriovenous malformation, site unspecified: Secondary | ICD-10-CM

## 2015-03-04 DIAGNOSIS — J449 Chronic obstructive pulmonary disease, unspecified: Secondary | ICD-10-CM

## 2015-03-04 DIAGNOSIS — D62 Acute posthemorrhagic anemia: Secondary | ICD-10-CM

## 2015-03-04 DIAGNOSIS — Z79899 Other long term (current) drug therapy: Secondary | ICD-10-CM

## 2015-03-04 DIAGNOSIS — I159 Secondary hypertension, unspecified: Secondary | ICD-10-CM

## 2015-03-04 DIAGNOSIS — B86 Scabies: Secondary | ICD-10-CM

## 2015-03-04 DIAGNOSIS — W19XXXA Unspecified fall, initial encounter: Secondary | ICD-10-CM | POA: Diagnosis present

## 2015-03-04 DIAGNOSIS — E669 Obesity, unspecified: Secondary | ICD-10-CM

## 2015-03-04 DIAGNOSIS — K922 Gastrointestinal hemorrhage, unspecified: Secondary | ICD-10-CM

## 2015-03-04 DIAGNOSIS — K31819 Angiodysplasia of stomach and duodenum without bleeding: Secondary | ICD-10-CM

## 2015-03-04 DIAGNOSIS — Z720 Tobacco use: Secondary | ICD-10-CM

## 2015-03-04 DIAGNOSIS — Z7982 Long term (current) use of aspirin: Secondary | ICD-10-CM

## 2015-03-04 DIAGNOSIS — Z87891 Personal history of nicotine dependence: Secondary | ICD-10-CM

## 2015-03-04 DIAGNOSIS — I2510 Atherosclerotic heart disease of native coronary artery without angina pectoris: Secondary | ICD-10-CM | POA: Diagnosis present

## 2015-03-04 DIAGNOSIS — E1122 Type 2 diabetes mellitus with diabetic chronic kidney disease: Secondary | ICD-10-CM | POA: Diagnosis present

## 2015-03-04 DIAGNOSIS — L8990 Pressure ulcer of unspecified site, unspecified stage: Secondary | ICD-10-CM

## 2015-03-04 DIAGNOSIS — R809 Proteinuria, unspecified: Secondary | ICD-10-CM

## 2015-03-04 DIAGNOSIS — I739 Peripheral vascular disease, unspecified: Secondary | ICD-10-CM

## 2015-03-04 DIAGNOSIS — E46 Unspecified protein-calorie malnutrition: Secondary | ICD-10-CM

## 2015-03-04 DIAGNOSIS — J9601 Acute respiratory failure with hypoxia: Secondary | ICD-10-CM

## 2015-03-04 DIAGNOSIS — G3183 Dementia with Lewy bodies: Secondary | ICD-10-CM

## 2015-03-04 DIAGNOSIS — F329 Major depressive disorder, single episode, unspecified: Secondary | ICD-10-CM | POA: Diagnosis present

## 2015-03-04 DIAGNOSIS — I5032 Chronic diastolic (congestive) heart failure: Secondary | ICD-10-CM

## 2015-03-04 DIAGNOSIS — R509 Fever, unspecified: Secondary | ICD-10-CM | POA: Insufficient documentation

## 2015-03-04 DIAGNOSIS — E162 Hypoglycemia, unspecified: Secondary | ICD-10-CM

## 2015-03-04 DIAGNOSIS — R079 Chest pain, unspecified: Secondary | ICD-10-CM

## 2015-03-04 DIAGNOSIS — R9439 Abnormal result of other cardiovascular function study: Secondary | ICD-10-CM

## 2015-03-04 DIAGNOSIS — N390 Urinary tract infection, site not specified: Secondary | ICD-10-CM | POA: Diagnosis not present

## 2015-03-04 DIAGNOSIS — I130 Hypertensive heart and chronic kidney disease with heart failure and stage 1 through stage 4 chronic kidney disease, or unspecified chronic kidney disease: Secondary | ICD-10-CM | POA: Diagnosis present

## 2015-03-04 DIAGNOSIS — R0602 Shortness of breath: Secondary | ICD-10-CM

## 2015-03-04 DIAGNOSIS — E119 Type 2 diabetes mellitus without complications: Secondary | ICD-10-CM

## 2015-03-04 DIAGNOSIS — F0280 Dementia in other diseases classified elsewhere without behavioral disturbance: Secondary | ICD-10-CM

## 2015-03-04 DIAGNOSIS — Z9981 Dependence on supplemental oxygen: Secondary | ICD-10-CM

## 2015-03-04 DIAGNOSIS — N189 Chronic kidney disease, unspecified: Secondary | ICD-10-CM | POA: Insufficient documentation

## 2015-03-04 DIAGNOSIS — G9341 Metabolic encephalopathy: Secondary | ICD-10-CM | POA: Diagnosis present

## 2015-03-04 DIAGNOSIS — E785 Hyperlipidemia, unspecified: Secondary | ICD-10-CM

## 2015-03-04 DIAGNOSIS — E860 Dehydration: Secondary | ICD-10-CM | POA: Diagnosis present

## 2015-03-04 DIAGNOSIS — N2589 Other disorders resulting from impaired renal tubular function: Secondary | ICD-10-CM | POA: Diagnosis present

## 2015-03-04 DIAGNOSIS — I5031 Acute diastolic (congestive) heart failure: Secondary | ICD-10-CM

## 2015-03-04 DIAGNOSIS — I25110 Atherosclerotic heart disease of native coronary artery with unstable angina pectoris: Secondary | ICD-10-CM

## 2015-03-04 DIAGNOSIS — R339 Retention of urine, unspecified: Secondary | ICD-10-CM | POA: Diagnosis not present

## 2015-03-04 DIAGNOSIS — N184 Chronic kidney disease, stage 4 (severe): Secondary | ICD-10-CM | POA: Diagnosis present

## 2015-03-04 DIAGNOSIS — F0390 Unspecified dementia without behavioral disturbance: Secondary | ICD-10-CM | POA: Diagnosis present

## 2015-03-04 DIAGNOSIS — I110 Hypertensive heart disease with heart failure: Secondary | ICD-10-CM

## 2015-03-04 DIAGNOSIS — I5033 Acute on chronic diastolic (congestive) heart failure: Secondary | ICD-10-CM

## 2015-03-04 DIAGNOSIS — G9340 Encephalopathy, unspecified: Secondary | ICD-10-CM

## 2015-03-04 DIAGNOSIS — K219 Gastro-esophageal reflux disease without esophagitis: Secondary | ICD-10-CM | POA: Diagnosis present

## 2015-03-04 DIAGNOSIS — E876 Hypokalemia: Secondary | ICD-10-CM

## 2015-03-04 DIAGNOSIS — R4182 Altered mental status, unspecified: Secondary | ICD-10-CM

## 2015-03-04 DIAGNOSIS — R531 Weakness: Secondary | ICD-10-CM

## 2015-03-04 LAB — LACTIC ACID, PLASMA
LACTIC ACID, VENOUS: 0.9 mmol/L (ref 0.5–2.0)
LACTIC ACID, VENOUS: 1.3 mmol/L (ref 0.5–2.0)

## 2015-03-04 LAB — CBC
HEMATOCRIT: 30.8 % — AB (ref 36.0–46.0)
Hemoglobin: 9.6 g/dL — ABNORMAL LOW (ref 12.0–15.0)
MCH: 29.5 pg (ref 26.0–34.0)
MCHC: 31.2 g/dL (ref 30.0–36.0)
MCV: 94.8 fL (ref 78.0–100.0)
PLATELETS: 204 10*3/uL (ref 150–400)
RBC: 3.25 MIL/uL — ABNORMAL LOW (ref 3.87–5.11)
RDW: 14.8 % (ref 11.5–15.5)
WBC: 7.6 10*3/uL (ref 4.0–10.5)

## 2015-03-04 LAB — VITAMIN B12: VITAMIN B 12: 564 pg/mL (ref 180–914)

## 2015-03-04 LAB — CBG MONITORING, ED
Glucose-Capillary: 136 mg/dL — ABNORMAL HIGH (ref 65–99)
Glucose-Capillary: 123 mg/dL — ABNORMAL HIGH (ref 65–99)

## 2015-03-04 LAB — URINALYSIS, ROUTINE W REFLEX MICROSCOPIC
Bilirubin Urine: NEGATIVE
GLUCOSE, UA: NEGATIVE mg/dL
Ketones, ur: NEGATIVE mg/dL
Leukocytes, UA: NEGATIVE
Nitrite: NEGATIVE
PH: 6.5 (ref 5.0–8.0)
Specific Gravity, Urine: 1.014 (ref 1.005–1.030)

## 2015-03-04 LAB — URINE MICROSCOPIC-ADD ON

## 2015-03-04 LAB — CBC WITH DIFFERENTIAL/PLATELET
BASOS PCT: 1 %
Basophils Absolute: 0.1 10*3/uL (ref 0.0–0.1)
EOS ABS: 0.1 10*3/uL (ref 0.0–0.7)
Eosinophils Relative: 1 %
HCT: 32.5 % — ABNORMAL LOW (ref 36.0–46.0)
Hemoglobin: 10.3 g/dL — ABNORMAL LOW (ref 12.0–15.0)
Lymphocytes Relative: 9 %
Lymphs Abs: 0.8 10*3/uL (ref 0.7–4.0)
MCH: 30.2 pg (ref 26.0–34.0)
MCHC: 31.7 g/dL (ref 30.0–36.0)
MCV: 95.3 fL (ref 78.0–100.0)
MONO ABS: 0.6 10*3/uL (ref 0.1–1.0)
MONOS PCT: 7 %
Neutro Abs: 7 10*3/uL (ref 1.7–7.7)
Neutrophils Relative %: 82 %
Platelets: 217 10*3/uL (ref 150–400)
RBC: 3.41 MIL/uL — ABNORMAL LOW (ref 3.87–5.11)
RDW: 15 % (ref 11.5–15.5)
WBC: 8.5 10*3/uL (ref 4.0–10.5)

## 2015-03-04 LAB — I-STAT CHEM 8, ED
BUN: 42 mg/dL — AB (ref 6–20)
CALCIUM ION: 2.26 mmol/L — AB (ref 1.13–1.30)
CREATININE: 1.9 mg/dL — AB (ref 0.44–1.00)
Chloride: 97 mmol/L — ABNORMAL LOW (ref 101–111)
GLUCOSE: 137 mg/dL — AB (ref 65–99)
HCT: 33 % — ABNORMAL LOW (ref 36.0–46.0)
Hemoglobin: 11.2 g/dL — ABNORMAL LOW (ref 12.0–15.0)
Potassium: 4.2 mmol/L (ref 3.5–5.1)
Sodium: 139 mmol/L (ref 135–145)
TCO2: 38 mmol/L (ref 0–100)

## 2015-03-04 LAB — GLUCOSE, CAPILLARY: GLUCOSE-CAPILLARY: 74 mg/dL (ref 65–99)

## 2015-03-04 LAB — CREATININE, SERUM
Creatinine, Ser: 1.98 mg/dL — ABNORMAL HIGH (ref 0.44–1.00)
GFR, EST AFRICAN AMERICAN: 30 mL/min — AB (ref >60)
GFR, EST NON AFRICAN AMERICAN: 26 mL/min — AB (ref >60)

## 2015-03-04 LAB — I-STAT TROPONIN, ED: TROPONIN I, POC: 0.03 ng/mL (ref 0.00–0.08)

## 2015-03-04 LAB — TSH: TSH: 0.714 u[IU]/mL (ref 0.350–4.500)

## 2015-03-04 LAB — RAPID URINE DRUG SCREEN, HOSP PERFORMED
AMPHETAMINES: NOT DETECTED
BENZODIAZEPINES: NOT DETECTED
Barbiturates: NOT DETECTED
COCAINE: NOT DETECTED
OPIATES: NOT DETECTED
Tetrahydrocannabinol: NOT DETECTED

## 2015-03-04 LAB — AMMONIA: AMMONIA: 13 umol/L (ref 9–35)

## 2015-03-04 MED ORDER — ASPIRIN EC 81 MG PO TBEC
81.0000 mg | DELAYED_RELEASE_TABLET | Freq: Every day | ORAL | Status: DC
  Administered 2015-03-04 – 2015-03-12 (×9): 81 mg via ORAL
  Filled 2015-03-04 (×9): qty 1

## 2015-03-04 MED ORDER — ONDANSETRON HCL 4 MG/2ML IJ SOLN: 4.0000 mg | Freq: Four times a day (QID) | INTRAMUSCULAR | Status: DC | PRN

## 2015-03-04 MED ORDER — FUROSEMIDE 80 MG PO TABS
80.0000 mg | ORAL_TABLET | Freq: Every day | ORAL | Status: DC
  Administered 2015-03-04 – 2015-03-05 (×2): 80 mg via ORAL
  Filled 2015-03-04: qty 4
  Filled 2015-03-04: qty 1

## 2015-03-04 MED ORDER — ALBUTEROL SULFATE HFA 108 (90 BASE) MCG/ACT IN AERS: 2.0000 | INHALATION_SPRAY | Freq: Four times a day (QID) | RESPIRATORY_TRACT | Status: DC | PRN

## 2015-03-04 MED ORDER — ONDANSETRON HCL 4 MG PO TABS: 4.0000 mg | ORAL_TABLET | Freq: Four times a day (QID) | ORAL | Status: DC | PRN

## 2015-03-04 MED ORDER — SODIUM CHLORIDE 0.9 % IJ SOLN
3.0000 mL | Freq: Two times a day (BID) | INTRAMUSCULAR | Status: DC
  Administered 2015-03-04: 3 mL via INTRAVENOUS

## 2015-03-04 MED ORDER — SODIUM CHLORIDE 0.9 % IV SOLN
INTRAVENOUS | Status: AC
  Administered 2015-03-04: 15:00:00 via INTRAVENOUS

## 2015-03-04 MED ORDER — INSULIN ASPART 100 UNIT/ML ~~LOC~~ SOLN: 0.0000 [IU] | Freq: Every day | SUBCUTANEOUS | Status: DC

## 2015-03-04 MED ORDER — FERROUS SULFATE 325 (65 FE) MG PO TABS
325.0000 mg | ORAL_TABLET | Freq: Three times a day (TID) | ORAL | Status: DC
  Administered 2015-03-04 – 2015-03-12 (×24): 325 mg via ORAL
  Filled 2015-03-04 (×25): qty 1

## 2015-03-04 MED ORDER — MOMETASONE FURO-FORMOTEROL FUM 100-5 MCG/ACT IN AERO
1.0000 | INHALATION_SPRAY | Freq: Every day | RESPIRATORY_TRACT | Status: DC
  Administered 2015-03-04 – 2015-03-09 (×3): 1 via RESPIRATORY_TRACT
  Filled 2015-03-04 (×2): qty 8.8

## 2015-03-04 MED ORDER — ALBUTEROL SULFATE (2.5 MG/3ML) 0.083% IN NEBU
5.0000 mg | INHALATION_SOLUTION | Freq: Once | RESPIRATORY_TRACT | Status: AC
  Administered 2015-03-04: 5 mg via RESPIRATORY_TRACT
  Filled 2015-03-04: qty 6

## 2015-03-04 MED ORDER — SODIUM CHLORIDE 0.9 % IJ SOLN: 3.0000 mL | INTRAMUSCULAR | Status: DC | PRN

## 2015-03-04 MED ORDER — ENOXAPARIN SODIUM 30 MG/0.3ML ~~LOC~~ SOLN
30.0000 mg | SUBCUTANEOUS | Status: DC
Start: 2015-03-04 — End: 2015-03-08
  Administered 2015-03-04 – 2015-03-07 (×4): 30 mg via SUBCUTANEOUS
  Filled 2015-03-04 (×4): qty 0.3

## 2015-03-04 MED ORDER — ACETAMINOPHEN 650 MG RE SUPP: 650.0000 mg | Freq: Four times a day (QID) | RECTAL | Status: DC | PRN

## 2015-03-04 MED ORDER — SODIUM CHLORIDE 0.9 % IV BOLUS (SEPSIS)
500.0000 mL | Freq: Once | INTRAVENOUS | Status: AC
  Administered 2015-03-04: 500 mL via INTRAVENOUS

## 2015-03-04 MED ORDER — CLONIDINE HCL 0.1 MG PO TABS
0.1000 mg | ORAL_TABLET | Freq: Two times a day (BID) | ORAL | Status: DC
  Administered 2015-03-04 – 2015-03-05 (×3): 0.1 mg via ORAL
  Filled 2015-03-04 (×3): qty 1

## 2015-03-04 MED ORDER — IPRATROPIUM BROMIDE 0.02 % IN SOLN
0.5000 mg | Freq: Once | RESPIRATORY_TRACT | Status: AC
  Administered 2015-03-04: 0.5 mg via RESPIRATORY_TRACT
  Filled 2015-03-04: qty 2.5

## 2015-03-04 MED ORDER — SODIUM CHLORIDE 0.9 % IV SOLN: INTRAVENOUS | Status: AC

## 2015-03-04 MED ORDER — INSULIN ASPART 100 UNIT/ML ~~LOC~~ SOLN
0.0000 [IU] | Freq: Three times a day (TID) | SUBCUTANEOUS | Status: DC
  Administered 2015-03-04: 1 [IU] via SUBCUTANEOUS
  Administered 2015-03-05: 2 [IU] via SUBCUTANEOUS
  Administered 2015-03-06 – 2015-03-07 (×3): 1 [IU] via SUBCUTANEOUS
  Administered 2015-03-08: 2 [IU] via SUBCUTANEOUS
  Administered 2015-03-08: 3 [IU] via SUBCUTANEOUS
  Administered 2015-03-09: 1 [IU] via SUBCUTANEOUS
  Administered 2015-03-09: 2 [IU] via SUBCUTANEOUS
  Administered 2015-03-09: 3 [IU] via SUBCUTANEOUS
  Administered 2015-03-10: 2 [IU] via SUBCUTANEOUS
  Administered 2015-03-10 – 2015-03-11 (×3): 1 [IU] via SUBCUTANEOUS
  Administered 2015-03-11: 2 [IU] via SUBCUTANEOUS
  Administered 2015-03-11 – 2015-03-12 (×2): 1 [IU] via SUBCUTANEOUS
  Filled 2015-03-04: qty 1

## 2015-03-04 MED ORDER — ISOSORBIDE MONONITRATE ER 30 MG PO TB24
30.0000 mg | ORAL_TABLET | Freq: Every day | ORAL | Status: DC
  Administered 2015-03-04 – 2015-03-12 (×9): 30 mg via ORAL
  Filled 2015-03-04 (×9): qty 1

## 2015-03-04 MED ORDER — CLOPIDOGREL BISULFATE 75 MG PO TABS
75.0000 mg | ORAL_TABLET | Freq: Every day | ORAL | Status: DC
  Administered 2015-03-04 – 2015-03-12 (×9): 75 mg via ORAL
  Filled 2015-03-04 (×9): qty 1

## 2015-03-04 MED ORDER — SODIUM CHLORIDE 0.9 % IJ SOLN
3.0000 mL | Freq: Two times a day (BID) | INTRAMUSCULAR | Status: DC
Start: 2015-03-04 — End: 2015-03-07
  Administered 2015-03-04 – 2015-03-06 (×5): 3 mL via INTRAVENOUS

## 2015-03-04 MED ORDER — ALBUTEROL SULFATE (2.5 MG/3ML) 0.083% IN NEBU: 2.5000 mg | INHALATION_SOLUTION | Freq: Four times a day (QID) | RESPIRATORY_TRACT | Status: DC | PRN

## 2015-03-04 MED ORDER — AMLODIPINE BESYLATE 10 MG PO TABS
10.0000 mg | ORAL_TABLET | Freq: Every day | ORAL | Status: DC
  Administered 2015-03-04 – 2015-03-12 (×9): 10 mg via ORAL
  Filled 2015-03-04: qty 1
  Filled 2015-03-04: qty 2
  Filled 2015-03-04 (×7): qty 1

## 2015-03-04 MED ORDER — ATORVASTATIN CALCIUM 80 MG PO TABS
80.0000 mg | ORAL_TABLET | Freq: Every day | ORAL | Status: DC
  Administered 2015-03-04 – 2015-03-11 (×8): 80 mg via ORAL
  Filled 2015-03-04: qty 1
  Filled 2015-03-04: qty 8
  Filled 2015-03-04 (×6): qty 1

## 2015-03-04 MED ORDER — ACETAMINOPHEN 325 MG PO TABS
650.0000 mg | ORAL_TABLET | Freq: Four times a day (QID) | ORAL | Status: DC | PRN
  Administered 2015-03-10 – 2015-03-11 (×3): 650 mg via ORAL
  Filled 2015-03-04 (×3): qty 2

## 2015-03-04 MED ORDER — SODIUM CHLORIDE 0.9 % IV SOLN: 250.0000 mL | INTRAVENOUS | Status: DC | PRN

## 2015-03-04 NOTE — ED Notes (Signed)
Contacted pharmacy to verify and send daily meds

## 2015-03-04 NOTE — ED Provider Notes (Signed)
CSN: [id number]     Arrival date & time 03/04/15  0902 History   First MD Initiated Contact with Patient 03/04/15 [phone]     No chief complaint on file.    (Consider location/radiation/quality/duration/timing/severity/associated sxs/prior Treatment) HPI   65 year old female with history of DM, COPD, chronic diastolic CHF, CAD (NSTEMI 2007 in the setting of anemia), HTN, CKD stage III, , GI AVM s/p cauterization, iron deficiency anemia, remote TIA brought here via EMS from home for evaluation of altered mental weakness. History is limited due to the dementia. Husband who is at bedside report that patient became confused yesterday, having generalized weakness, dropping objects, having difficulty walking and did fell once last night while walking towards the bathroom. She did not hit her head and no loss of consciousness. Husband witnessed the fall and was able to assist patient up. He has not noticed any new changes recently that leads up to this event. Patient currently denies having headache, vision changes, URI symptoms, neck pain, chest pain, difficulty breathing, productive cough, abdominal pain, lightheadedness of dizziness, trouble urinating. Level V caveats for altered mental status.  Had cardiac cath 01/23/15 with significant disease.   Past Medical History  Diagnosis Date  . Peripheral vascular disease ([place])     a.s /p left common iliac and external iliac artery stents. b. H/o subclavian, mesenteric, and celiac artery stenosis (see below).  . Diabetes mellitus   . COPD (chronic obstructive pulmonary disease) ([facility])     home oxygen  . Hyperlipidemia   . Depression   . Chronic diastolic CHF (congestive heart failure) ([place])   . Essential hypertension   . Renal artery stenosis ([place])     a. Duplex 2014: no obvious evidence of hemodynamically significant stenosis >60%, bilateral intrarenal arteries exhibit absent diastolic flow. There is evidence of elevated velocities of the mid aorta. There  is celiac artery and superior mesenteric artery stenosis >70%.  [name] CAD (coronary artery disease)   . Iron deficiency anemia   . Diabetic retinopathy ([place])   . 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 Carotid artery disease ([place])     a. Duplex 12/2014: left CEA patent with elevated velocities, 60-79% RICA (stable over serial exams), 123456 LICA stenosis (stable over serial exams), 50% LECA, elevated velocities in bilateral subclavian arteries.  . Hypertensive heart disease with congestive heart failure ([place])   . CKD (chronic kidney disease), stage III   . Tobacco abuse   . Bradycardia     a. Limiting BB titration.  . Anemia, chronic disease 05/16/2008    Qualifier: Diagnosis of  By: Percival Spanish, MD, Farrel Gordon    . Dementia   . Protein-calorie malnutrition ([place]) 01/24/2015   Past Surgical History  Procedure Laterality Date  . Carotid endarterectomy    . Cholecystectomy    . Appendectomy    . Tonsillectomy    . Cesarean section    . Shoulder arthroscopy    . Iliac artery stent      left  . Enteroscopy  01/26/2012    Procedure: ENTEROSCOPY;  Surgeon: Gatha Mayer, MD;  Location: [facility];  Service: Endoscopy;  Laterality: N/A;  . Cardiac catheterization N/A 01/23/2015    Procedure: Left Heart Cath and Coronary Angiography;  Surgeon: [name], MD;  Location: [facility];  Service: Cardiovascular;  Laterality: N/A;   Family History  Problem Relation Age  of Onset  . Hypertension Mother   . Heart attack Mother   . Hypertension Father   . Angina Father   . Cancer Sister   . Cancer Brother   . Stroke Neg Hx    Social History  Substance Use Topics  . Smoking status: Former Smoker -- 0.00 packs/day for 36 years    Types: Cigarettes    Quit date: 02/11/2015  . Smokeless tobacco: Never Used     Comment: uses e-cig  . Alcohol Use: No   OB History    No  data available     Review of Systems  Unable to perform ROS: Dementia      Allergies  Review of patient's allergies indicates no known allergies.  Home Medications   Prior to Admission medications   Medication Sig Start Date End Date Taking? Authorizing Provider  acetaminophen (TYLENOL) 500 MG tablet Take 500 mg by mouth every 6 (six) hours as needed (pain).     Historical Provider, MD  albuterol (PROVENTIL HFA;VENTOLIN HFA) 108 (90 BASE) MCG/ACT inhaler Inhale 2 puffs into the lungs every 6 (six) hours as needed for wheezing or shortness of breath. 02/17/15   Arnoldo Morale, MD  amLODipine (NORVASC) 10 MG tablet Take 1 tablet (10 mg total) by mouth daily. 02/17/15   Arnoldo Morale, MD  aspirin EC 81 MG tablet Take 1 tablet (81 mg total) by mouth daily. 02/17/15   Arnoldo Morale, MD  atorvastatin (LIPITOR) 80 MG tablet Take 1 tablet (80 mg total) by mouth daily at 6 PM. 02/17/15   Arnoldo Morale, MD  Blood Glucose Monitoring Suppl (TRUE METRIX METER) DEVI Inject 1 each into the skin daily before breakfast. 02/17/15   Arnoldo Morale, MD  Calcium Carbonate Antacid (TUMS PO) Take 1 tablet by mouth daily as needed (acid reflux/heartburn).    Historical Provider, MD  carvedilol (COREG) 12.5 MG tablet Take 1 tablet (12.5 mg total) by mouth 2 (two) times daily with a meal. 02/20/15   Isaiah Serge, NP  cloNIDine (CATAPRES) 0.1 MG tablet Take 1 tablet (0.1 mg total) by mouth 2 (two) times daily. 02/17/15   Arnoldo Morale, MD  clopidogrel (PLAVIX) 75 MG tablet Take 1 tablet (75 mg total) by mouth daily. 02/17/15   Arnoldo Morale, MD  Ferrous Sulfate (IRON) 325 (65 FE) MG TABS Take 325 mg by mouth 3 (three) times daily. 02/17/15   Arnoldo Morale, MD  fish oil-omega-3 fatty acids 1000 MG capsule Take 1 g by mouth 3 (three) times daily. Three times daily    Historical Provider, MD  Fluticasone Furoate-Vilanterol (BREO ELLIPTA) 100-25 MCG/INH AEPB Inhale 1 Inhaler into the lungs daily. 02/17/15   Arnoldo Morale, MD   furosemide (LASIX) 80 MG tablet Take 1 tablet (80 mg total) by mouth daily. 02/20/15   Isaiah Serge, NP  glucose blood (TRUE METRIX BLOOD GLUCOSE TEST) test strip Uses once daily as directed 02/17/15   Arnoldo Morale, MD  hydrocortisone 2.5 % lotion Apply topically 2 (two) times daily. 01/08/15   Dorena Dew, FNP  isosorbide mononitrate (IMDUR) 30 MG 24 hr tablet Take 1 tablet (30 mg total) by mouth daily. 02/17/15   Arnoldo Morale, MD  KIONEX 15 GM/60ML suspension Take 1 Dose by mouth as needed. 01/09/15   Historical Provider, MD  nitroGLYCERIN (NITROSTAT) 0.4 MG SL tablet Place 1 tablet (0.4 mg total) under the tongue every 5 (five) minutes as needed for chest pain. 01/28/15   Delfina Redwood, MD  permethrin (  ELIMITE) 5 % cream Apply 1 application topically as needed. 01/09/15   Historical Provider, MD  sertraline (ZOLOFT) 50 MG tablet Take 1 tablet (50 mg total) by mouth daily. 02/04/15   Arnoldo Morale, MD  TRUEPLUS LANCETS 28G MISC Inject 1 each into the skin daily before breakfast. 02/17/15   Arnoldo Morale, MD   There were no vitals taken for this visit. Physical Exam  Constitutional: She appears well-developed and well-nourished. No distress.  Ill-appearing Caucasian female with garbled speech making it difficult to understand.  HENT:  Head: Atraumatic.  No scalp tenderness or signs of head injury. Mouth is dry.  Eyes: Conjunctivae are normal. Pupils are equal, round, and reactive to light.  Poor visual tracking  Neck: Normal range of motion. Neck supple.  No nuchal rigidity  Cardiovascular: Normal rate and regular rhythm.   Murmur heard. Pulmonary/Chest: Effort normal and breath sounds normal. No respiratory distress. She has no wheezes.  Abdominal: Soft. There is no tenderness.  Musculoskeletal: She exhibits no edema.  Neurological: She is alert. No cranial nerve deficit or sensory deficit. She exhibits abnormal muscle tone (Able to move all 4 extremities with poor effort.).  GCS eye subscore is 4. GCS verbal subscore is 5. GCS motor subscore is 5.  Patient is alert, and oriented to self and place but disoriented to time and having difficulty communicating thoughts.  Skin: No rash noted.  Psychiatric: She has a normal mood and affect.  Nursing note and vitals reviewed.   ED Course  Procedures (including critical care time) Labs Review Labs Reviewed  CBC WITH DIFFERENTIAL/PLATELET - Abnormal; Notable for the following:    RBC 3.41 (*)    Hemoglobin 10.3 (*)    HCT 32.5 (*)    All other components within normal limits  URINALYSIS, ROUTINE W REFLEX MICROSCOPIC (NOT AT [facility]) - Abnormal; Notable for the following:    Hgb urine dipstick TRACE (*)    Protein, ur >300 (*)    All other components within normal limits  URINE MICROSCOPIC-ADD ON - Abnormal; Notable for the following:    Squamous Epithelial / LPF 0-5 (*)    Bacteria, UA RARE (*)    Casts HYALINE CASTS (*)    All other components within normal limits  CBG MONITORING, ED - Abnormal; Notable for the following:    Glucose-Capillary 136 (*)    All other components within normal limits  I-STAT CHEM 8, ED - Abnormal; Notable for the following:    Chloride 97 (*)    BUN 42 (*)    Creatinine, Ser 1.90 (*)    Glucose, Bld 137 (*)    Calcium, Ion 2.26 (*)    Hemoglobin 11.2 (*)    HCT 33.0 (*)    All other components within normal limits  I-STAT TROPOININ, ED    Imaging Review Dg Chest 2 View  03/04/2015  CLINICAL DATA:  Altered mental status today. Bilateral upper extremity weakness. EXAM: CHEST  2 VIEW COMPARISON:  PA and lateral chest 01/21/2015 and 09/07/2012. CT chest 12/14/2011. FINDINGS: There is cardiomegaly and mild vascular congestion. No consolidative process, pneumothorax or effusion is identified. IMPRESSION: No acute disease. Cardiomegaly and vascular congestion. Electronically Signed   By: Inge Rise M.D.   On: 03/04/2015 10:25   Ct Head Wo Contrast  03/04/2015  CLINICAL DATA:   Altered mental status, weakness, difficulty with ambulation EXAM: CT HEAD WITHOUT CONTRAST TECHNIQUE: Contiguous axial images were obtained from the base of the skull through the vertex without  intravenous contrast. COMPARISON:  08/10/2004 FINDINGS: There is no evidence of mass effect, midline shift, or extra-axial fluid collections. There is no evidence of a space-occupying lesion or intracranial hemorrhage. There is no evidence of a cortical-based area of acute infarction. There is generalized cerebral atrophy. There is periventricular white matter low attenuation likely secondary to microangiopathy. The ventricles and sulci are appropriate for the patient's age. The basal cisterns are patent. Visualized portions of the orbits are unremarkable. The visualized portions of the paranasal sinuses and mastoid air cells are unremarkable. Cerebrovascular atherosclerotic calcifications are noted. The osseous structures are unremarkable. IMPRESSION: 1. No acute intracranial pathology. 2. Chronic microvascular disease and cerebral atrophy. Electronically Signed   By: Kathreen Devoid   On: 03/04/2015 10:36   I have personally reviewed and evaluated these images and lab results as part of my medical decision-making.   EKG Interpretation   Date/Time:  Wednesday March 04 2015 10:46:09 EST Ventricular Rate:  58 PR Interval:  137 QRS Duration: 104 QT Interval:  397 QTC Calculation: 390 R Axis:   118 Text Interpretation:  Sinus rhythm Right axis deviation nonspecific ST  changes No significant change was found Confirmed by CAMPOS  MD, KEVIN  ([id number]) on 03/04/2015 11:38:52 AM      MDM   Final diagnoses:  Altered mental status, unspecified altered mental status type  Generalized weakness    BP 196/55 mmHg  Pulse 52  Temp(Src) 98.3 F (36.8 C)  Resp 14  SpO2 97%   9:44 AM Patient with history of dementia and multiple comorbidities brought here due to generalized weakness, altered mental status.  History is limited due to her altered mental status. She exhibits global weakness. Workup initiated. She is currently hypertensive with a blood pressure 194/98. She is bradycardic but appears to be chronic. Bradycardia may potentially attributed to her current weakness.   Care discussed with DR. Campos.   10:15 AM Nurses documented that patient has an O2 sats of 80-99% some room air with shallow respiration. Patient was placed on 2 L of nasal cannula and oxygenation improved to 97%.  12:58 PM Appreciate consultation from [place], NP who agrees to see pt in the ER and will admit to telemetry bed under attending Dr. Marily Memos.  Our nurse who is currently taking care of pt notified that pt's O2 sats are low 90s on RA, and improves with [place] 2L.  Pt may need to be assess for possible PE vs. COPD exacerbation.  Pt has impaired renal function, therefore chest CT angio is not indicated.  Will give breathing treatment and will defer further work up to admitting team.     Domenic Moras, PA-C 03/04/15 [place], MD 03/04/15 1327

## 2015-03-04 NOTE — H&P (Signed)
[facility] History and Physical  [name] [id number] DOB: [birth year] DOA: 03/04/2015  Referring physician: Gertie [name] PCP: [name] Palau, MD   Chief Complaint: ams generalized weakness  HPI: [name] is a 65 y.o. female past medical history that includes type 2 diabetes, hypertension, COPD, diastolic heart failure, on home oxygen, chronic kidney disease stage III presents emergency department from home chief complaint mental status and generalized weakness. Initial evaluation in the emergency department concerning for acute on chronic kidney disease, acute encephalopathy setting of generalized weakness.  Information is obtained from the chart as unable to obtain information from patient and family's not bedside. Earlier husband reported 3 day history of gradual worsening generalized weakness decreased oral intake. Husband also reports patient was dropping objects having difficulty walking. Reportedly one fall last night she was walking towards bathroom. He states she did not hit her head and there was no loss of consciousness. No report or complaints of chest pain palpitations headache syncope or near-syncope. No complaints of abdominal pain nausea vomiting diarrhea dysuria hematuria frequency or urgency. No complaints of chills fever no recent travel or sick contacts. Husband reports at baseline she has some short-term memory issues and a mild to moderate speech impediment but she is able to communicate and make her wants and needs known  In the emergency department she is provided with 500 mils of normal saline Atrovent nebulizer.  Review of Systems:  Unable to complete 10 point review of systems was patient's due to acute encephalopathy Past Medical History  Diagnosis Date  . Peripheral vascular disease ([place])     a.s /p left common iliac and external iliac artery stents. b. H/o subclavian, mesenteric, and celiac artery stenosis (see below).  . Diabetes mellitus   .  COPD (chronic obstructive pulmonary disease) (HCC)     home oxygen  . Hyperlipidemia   . Depression   . Chronic diastolic CHF (congestive heart failure) ([place])   . Essential hypertension   . Renal artery stenosis ([place])     a. Duplex 2014: no obvious evidence of hemodynamically significant stenosis >60%, bilateral intrarenal arteries exhibit absent diastolic flow. There is evidence of elevated velocities of the mid aorta. 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  [name] CAD (coronary artery disease)   . Iron deficiency anemia   . Diabetic retinopathy ([place])   . 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 Carotid artery disease ([place])     a. Duplex 12/2014: left CEA patent with elevated velocities, 60-79% RICA (stable over serial exams), [id number] LICA stenosis (stable over serial exams), 50% LECA, elevated velocities in bilateral subclavian arteries.  . Hypertensive heart disease with congestive heart failure ([place])   . CKD (chronic kidney disease), stage III   . Tobacco abuse   . Bradycardia     a. Limiting BB titration.  . Anemia, chronic disease 05/16/2008    Qualifier: Diagnosis of  By: Percival Spanish, MD, Farrel Gordon    . Dementia   . Protein-calorie malnutrition ([place]) 01/24/2015   Past Surgical History  Procedure Laterality Date  . Carotid endarterectomy    . Cholecystectomy    . Appendectomy    . Tonsillectomy    . Cesarean section    . Shoulder arthroscopy    . Iliac artery stent      left  . Enteroscopy  01/26/2012    Procedure: ENTEROSCOPY;  Surgeon: Gatha Mayer, MD;  Location: [facility];  Service: Endoscopy;  Laterality: N/A;  . Cardiac catheterization N/A 01/23/2015    Procedure: Left Heart Cath and Coronary Angiography;  Surgeon: [name], MD;  Location: [facility];  Service: Cardiovascular;  Laterality: N/A;   Social History:   reports that she quit smoking about 3 weeks ago. Her smoking use included Cigarettes. She smoked 0.00 packs per day for 36 years. She has never used smokeless tobacco. She reports that she does not drink alcohol or use illicit drugs. Patient lives at home with her husband she is on home oxygen at 2 L she's mild to moderate assist with ADLs No Known Allergies  Family History  Problem Relation Age of Onset  . Hypertension Mother   . Heart attack Mother   . Hypertension Father   . Angina Father   . Cancer Sister   . Cancer Brother   . Stroke Neg Hx      Prior to Admission medications   Medication Sig Start Date End Date Taking? Authorizing Provider  acetaminophen (TYLENOL) 500 MG tablet Take 500 mg by mouth every 6 (six) hours as needed (pain).     Historical Provider, MD  albuterol (PROVENTIL HFA;VENTOLIN HFA) 108 (90 BASE) MCG/ACT inhaler Inhale 2 puffs into the lungs every 6 (six) hours as needed for wheezing or shortness of breath. 02/17/15   Arnoldo Morale, MD  amLODipine (NORVASC) 10 MG tablet Take 1 tablet (10 mg total) by mouth daily. 02/17/15   Arnoldo Morale, MD  aspirin EC 81 MG tablet Take 1 tablet (81 mg total) by mouth daily. 02/17/15   Arnoldo Morale, MD  atorvastatin (LIPITOR) 80 MG tablet Take 1 tablet (80 mg total) by mouth daily at 6 PM. 02/17/15   Arnoldo Morale, MD  Blood Glucose Monitoring Suppl (TRUE METRIX METER) DEVI Inject 1 each into the skin daily before breakfast. 02/17/15   Arnoldo Morale, MD  Calcium Carbonate Antacid (TUMS PO) Take 1 tablet by mouth daily as needed (acid reflux/heartburn).    Historical Provider, MD  carvedilol (COREG) 12.5 MG tablet Take 1 tablet (12.5 mg total) by mouth 2 (two) times daily with a meal. 02/20/15   Isaiah Serge, NP  cloNIDine (CATAPRES) 0.1 MG tablet Take 1 tablet (0.1 mg total) by mouth 2 (two) times daily. 02/17/15   Arnoldo Morale, MD  clopidogrel (PLAVIX) 75 MG tablet Take 1 tablet (75 mg total) by mouth daily. 02/17/15   Arnoldo Morale, MD  Ferrous Sulfate (IRON) 325 (65 FE) MG TABS Take 325 mg by mouth 3 (three) times daily. 02/17/15   Arnoldo Morale, MD  fish oil-omega-3 fatty acids 1000 MG capsule Take 1 g by mouth 3 (three) times daily. Three times daily    Historical Provider, MD  Fluticasone Furoate-Vilanterol (BREO ELLIPTA) 100-25 MCG/INH AEPB Inhale 1 Inhaler into the lungs daily. 02/17/15   Arnoldo Morale, MD  furosemide (LASIX) 80 MG tablet Take 1 tablet (80 mg total) by mouth daily. 02/20/15   Isaiah Serge, NP  glucose blood (TRUE METRIX BLOOD GLUCOSE TEST) test strip Uses once daily as directed 02/17/15   Arnoldo Morale, MD  hydrocortisone 2.5 % lotion Apply topically 2 (two) times daily. 01/08/15   Dorena Dew, FNP  isosorbide mononitrate (IMDUR) 30 MG 24 hr tablet Take 1 tablet (30 mg total) by mouth daily. 02/17/15   Arnoldo Morale, MD  KIONEX 15 GM/60ML suspension Take 1 Dose by mouth as needed. 01/09/15  Historical Provider, MD  nitroGLYCERIN (NITROSTAT) 0.4 MG SL tablet Place 1 tablet (0.4 mg total) under the tongue every 5 (five) minutes as needed for chest pain. 01/28/15   Delfina Redwood, MD  permethrin (ELIMITE) 5 % cream Apply 1 application topically as needed. 01/09/15   Historical Provider, MD  sertraline (ZOLOFT) 50 MG tablet Take 1 tablet (50 mg total) by mouth daily. 02/04/15   Arnoldo Morale, MD  TRUEPLUS LANCETS 28G MISC Inject 1 each into the skin daily before breakfast. 02/17/15   Arnoldo Morale, MD   Physical Exam: Filed Vitals:   03/04/15 1200 03/04/15 1230 03/04/15 1309 03/04/15 1315  BP: 172/58 196/55 201/49 196/94  Pulse: 52 52 56 54  Temp:      Resp: 13 14 16 15   SpO2: 96% 97% 100% 100%    Wt Readings from Last 3 Encounters:  02/20/15 55.067 kg (121 lb 6.4 oz)  02/17/15 58.423 kg (128 lb 12.8 oz)  02/04/15 62.143 kg (137 lb)    General:  Appears calm and somewhat acutely ill not uncomfortable Eyes: PERRL, normal lids, irises & conjunctiva ENT: grossly normal hearing, mucous  membranes of her mouth are moist and pink, very poor dentition Neck: no LAD, masses or thyromegaly Cardiovascular: RRR, +murmur No LE edema.  Respiratory:  Normal respiratory effort. Rest sounds somewhat distant but clear hear no wheeze no rhonchi Abdomen: soft, ntnd positive bowel sounds throughout no guarding or rebounding Skin: no rash or induration seen on limited exam Musculoskeletal: grossly normal tone BUE/BLE Psychiatric: grossly normal mood and affect, speech fluent and appropriate Neurologic: Patient alert makes eye contact and attempts to follow commands speech or language nonsensical           Labs on Admission:  Basic Metabolic Panel:  Recent Labs Lab 03/04/15 0952  NA 139  K 4.2  CL 97*  GLUCOSE 137*  BUN 42*  CREATININE 1.90*   Liver Function Tests: No results for input(s): AST, ALT, ALKPHOS, BILITOT, PROT, ALBUMIN in the last 168 hours. No results for input(s): LIPASE, AMYLASE in the last 168 hours. No results for input(s): AMMONIA in the last 168 hours. CBC:  Recent Labs Lab 03/04/15 0929 03/04/15 0952  WBC 8.5  --   NEUTROABS 7.0  --   HGB 10.3* 11.2*  HCT 32.5* 33.0*  MCV 95.3  --   PLT 217  --    Cardiac Enzymes: No results for input(s): CKTOTAL, CKMB, CKMBINDEX, TROPONINI in the last 168 hours.  BNP (last 3 results)  Recent Labs  11/07/14 0325 12/24/14 1030 01/21/15 2041  BNP 2634.2* 1288.9* 833.4*    ProBNP (last 3 results) No results for input(s): PROBNP in the last 8760 hours.  CBG:  Recent Labs Lab 03/04/15 0927  GLUCAP 136*    Radiological Exams on Admission: Dg Chest 2 View  03/04/2015  CLINICAL DATA:  Altered mental status today. Bilateral upper extremity weakness. EXAM: CHEST  2 VIEW COMPARISON:  PA and lateral chest 01/21/2015 and 09/07/2012. CT chest 12/14/2011. FINDINGS: There is cardiomegaly and mild vascular congestion. No consolidative process, pneumothorax or effusion is identified. IMPRESSION: No acute disease.  Cardiomegaly and vascular congestion. Electronically Signed   By: Inge Rise M.D.   On: 03/04/2015 10:25   Ct Head Wo Contrast  03/04/2015  CLINICAL DATA:  Altered mental status, weakness, difficulty with ambulation EXAM: CT HEAD WITHOUT CONTRAST TECHNIQUE: Contiguous axial images were obtained from the base of the skull through the vertex without intravenous contrast. COMPARISON:  08/10/2004 FINDINGS: There is no evidence of mass effect, midline shift, or extra-axial fluid collections. There is no evidence of a space-occupying lesion or intracranial hemorrhage. There is no evidence of a cortical-based area of acute infarction. There is generalized cerebral atrophy. There is periventricular white matter low attenuation likely secondary to microangiopathy. The ventricles and sulci are appropriate for the patient's age. The basal cisterns are patent. Visualized portions of the orbits are unremarkable. The visualized portions of the paranasal sinuses and mastoid air cells are unremarkable. Cerebrovascular atherosclerotic calcifications are noted. The osseous structures are unremarkable. IMPRESSION: 1. No acute intracranial pathology. 2. Chronic microvascular disease and cerebral atrophy. Electronically Signed   By: Kathreen Devoid   On: 03/04/2015 10:36    EKG: Independently reviewed. Sinus rhythm  Assessment/Plan Principal Problem:   Acute encephalopathy Active Problems:   OBESITY   Anemia, chronic disease   Chronic obstructive pulmonary disease (HCC)   Diabetes mellitus (HCC)   Iron deficiency anemia   CAD (coronary artery disease)   Chronic diastolic CHF (congestive heart failure) (HCC)   Essential hypertension   Bradycardia   Acute renal failure superimposed on stage 3 chronic kidney disease (H[facility])   Generalized weakness  #1. Acute encephalopathy. Etiology unclear. Chest x-ray with mild vascular congestion no infiltrate, urinalysis unremarkable, CT of the head without acute abnormality.  Lab work shows acute on chronic kidney disease in setting of dehydration. -Admit to telemetry -check lactic acid, amonnia, UDS -Obtain RPR, vitamin B-12 and folate -Review medications and hold any sedating/altering medication -Gentle IV fluids   #2 acute on chronic kidney disease stage III. Likely related to decreased oral intake in the setting of diuretics. Current creatinine only slightly above  base line. -Hold nephrotoxins -Very gentle IV fluids -Monitor urine output -Recheck in the a.m.  #3. Hypertension. Poor control in the emergency department. Likely related to the fact she hasn't had any of her home medications one of which includes clonidine (rebound?) Home medications include amlodipine, Coreg, imdur -I will continue the amlodipine, imdur, Coreg and a lower dose and hold the Lasix -Monitor closely -When necessary hydralazine  #4. COPD. On home oxygen. Appears to be stable at baseline -Continue home inhalers  #5 generalized weakness. Likely related to above. -See above therapies -Physical therapy evaluation  #6. Diabetes, -Obtain hemoglobin A1c -Sliding scale insulin for optimal control  7. Chronic diastolic heart failure. Echo done in December 2016 with an EF of 60% and grade 2 diastolic dysfunction -Appears a little on the dry side -We'll hold Lasix for now -Very gentle IV fluids -Monitor intake and output -Obtain daily weights  #8. Bradycardia. History of same. Heart rate range 47-55. EKG was sinus rhythm no acute changes. May be contributing to generalized weakness -Home medications include Coreg. I will continue this but reduce the dose by half -Monitor on telemetry   Code Status: full DVT Prophylaxis: Family Communication: none Disposition Plan: home when ready  Time spent: 26 minuttes  [facility]

## 2015-03-04 NOTE — ED Notes (Signed)
Assisted pt to ambulate in hall - pt unable to keep eyes open, staggering/unsteady gait, sat down on floor after 4-5 steps.

## 2015-03-04 NOTE — ED Notes (Signed)
Per pt family member, reports worsening difficulty w/ ambulation since Monday. Pt confusion began yesterday. Pt alert and oriented to self, situation, and place. Disoriented to time. Pt difficulty communicating thoughts. Denies pain. Pt hx diabetes, hypertension, high cholesterol.

## 2015-03-04 NOTE — ED Notes (Signed)
SpO2 80-89% on RA w/ shallow respirations; placed on 2L [place], SpO2 97%.

## 2015-03-04 NOTE — Progress Notes (Signed)
Arrived from Ed. Lethargic. Oriented x2. Slow to respond. Follow commands. Oriented to room. Safety measure in place. Will continue to monitor.

## 2015-03-04 NOTE — ED Notes (Signed)
Family at the bedside , Dyanne Carrel, NP is updating family on pt.s status. Dyanne Carrel, NP is also aware of pt. Is lethargic,  She does respond and open eyes to verbal and tactile stimuli

## 2015-03-05 ENCOUNTER — Ambulatory Visit: Payer: Self-pay | Admitting: Cardiology

## 2015-03-05 DIAGNOSIS — I25110 Atherosclerotic heart disease of native coronary artery with unstable angina pectoris: Secondary | ICD-10-CM | POA: Diagnosis present

## 2015-03-05 DIAGNOSIS — G9341 Metabolic encephalopathy: Secondary | ICD-10-CM | POA: Diagnosis present

## 2015-03-05 DIAGNOSIS — E872 Acidosis: Secondary | ICD-10-CM | POA: Diagnosis present

## 2015-03-05 DIAGNOSIS — E860 Dehydration: Secondary | ICD-10-CM | POA: Diagnosis present

## 2015-03-05 DIAGNOSIS — D509 Iron deficiency anemia, unspecified: Secondary | ICD-10-CM | POA: Diagnosis present

## 2015-03-05 DIAGNOSIS — N170 Acute kidney failure with tubular necrosis: Secondary | ICD-10-CM | POA: Diagnosis not present

## 2015-03-05 DIAGNOSIS — F329 Major depressive disorder, single episode, unspecified: Secondary | ICD-10-CM | POA: Diagnosis present

## 2015-03-05 DIAGNOSIS — F0390 Unspecified dementia without behavioral disturbance: Secondary | ICD-10-CM | POA: Diagnosis present

## 2015-03-05 DIAGNOSIS — J449 Chronic obstructive pulmonary disease, unspecified: Secondary | ICD-10-CM | POA: Diagnosis present

## 2015-03-05 DIAGNOSIS — Z8249 Family history of ischemic heart disease and other diseases of the circulatory system: Secondary | ICD-10-CM | POA: Diagnosis not present

## 2015-03-05 DIAGNOSIS — I701 Atherosclerosis of renal artery: Secondary | ICD-10-CM | POA: Diagnosis present

## 2015-03-05 DIAGNOSIS — E11319 Type 2 diabetes mellitus with unspecified diabetic retinopathy without macular edema: Secondary | ICD-10-CM | POA: Diagnosis present

## 2015-03-05 DIAGNOSIS — I5032 Chronic diastolic (congestive) heart failure: Secondary | ICD-10-CM | POA: Diagnosis present

## 2015-03-05 DIAGNOSIS — E785 Hyperlipidemia, unspecified: Secondary | ICD-10-CM | POA: Diagnosis present

## 2015-03-05 DIAGNOSIS — N184 Chronic kidney disease, stage 4 (severe): Secondary | ICD-10-CM | POA: Diagnosis present

## 2015-03-05 DIAGNOSIS — I739 Peripheral vascular disease, unspecified: Secondary | ICD-10-CM | POA: Diagnosis present

## 2015-03-05 DIAGNOSIS — N390 Urinary tract infection, site not specified: Secondary | ICD-10-CM | POA: Diagnosis not present

## 2015-03-05 DIAGNOSIS — R001 Bradycardia, unspecified: Secondary | ICD-10-CM | POA: Diagnosis not present

## 2015-03-05 DIAGNOSIS — W19XXXA Unspecified fall, initial encounter: Secondary | ICD-10-CM | POA: Diagnosis present

## 2015-03-05 DIAGNOSIS — I130 Hypertensive heart and chronic kidney disease with heart failure and stage 1 through stage 4 chronic kidney disease, or unspecified chronic kidney disease: Secondary | ICD-10-CM | POA: Diagnosis present

## 2015-03-05 DIAGNOSIS — D638 Anemia in other chronic diseases classified elsewhere: Secondary | ICD-10-CM | POA: Diagnosis present

## 2015-03-05 DIAGNOSIS — E1122 Type 2 diabetes mellitus with diabetic chronic kidney disease: Secondary | ICD-10-CM | POA: Diagnosis present

## 2015-03-05 DIAGNOSIS — R339 Retention of urine, unspecified: Secondary | ICD-10-CM | POA: Diagnosis not present

## 2015-03-05 LAB — GLUCOSE, CAPILLARY
GLUCOSE-CAPILLARY: 103 mg/dL — AB (ref 65–99)
GLUCOSE-CAPILLARY: 149 mg/dL — AB (ref 65–99)
GLUCOSE-CAPILLARY: 88 mg/dL (ref 65–99)
Glucose-Capillary: 113 mg/dL — ABNORMAL HIGH (ref 65–99)

## 2015-03-05 LAB — COMPREHENSIVE METABOLIC PANEL
ALK PHOS: 59 U/L (ref 38–126)
ALT: 11 U/L — ABNORMAL LOW (ref 14–54)
ANION GAP: 9 (ref 5–15)
AST: 20 U/L (ref 15–41)
Albumin: 2.5 g/dL — ABNORMAL LOW (ref 3.5–5.0)
BUN: 48 mg/dL — ABNORMAL HIGH (ref 6–20)
CO2: 33 mmol/L — AB (ref 22–32)
Chloride: 100 mmol/L — ABNORMAL LOW (ref 101–111)
Creatinine, Ser: 2.58 mg/dL — ABNORMAL HIGH (ref 0.44–1.00)
GFR, EST AFRICAN AMERICAN: 21 mL/min — AB (ref >60)
GFR, EST NON AFRICAN AMERICAN: 19 mL/min — AB (ref >60)
Glucose, Bld: 90 mg/dL (ref 65–99)
Potassium: 4.1 mmol/L (ref 3.5–5.1)
SODIUM: 142 mmol/L (ref 135–145)
TOTAL PROTEIN: 5.3 g/dL — AB (ref 6.5–8.1)
Total Bilirubin: 0.4 mg/dL (ref 0.3–1.2)

## 2015-03-05 LAB — CBC
HCT: 29.4 % — ABNORMAL LOW (ref 36.0–46.0)
HEMOGLOBIN: 9.2 g/dL — AB (ref 12.0–15.0)
MCH: 29.6 pg (ref 26.0–34.0)
MCHC: 31.3 g/dL (ref 30.0–36.0)
MCV: 94.5 fL (ref 78.0–100.0)
PLATELETS: 209 10*3/uL (ref 150–400)
RBC: 3.11 MIL/uL — AB (ref 3.87–5.11)
RDW: 14.9 % (ref 11.5–15.5)
WBC: 8.9 10*3/uL (ref 4.0–10.5)

## 2015-03-05 LAB — HEMOGLOBIN A1C
HEMOGLOBIN A1C: 6.2 % — AB (ref 4.8–5.6)
MEAN PLASMA GLUCOSE: 131 mg/dL

## 2015-03-05 LAB — FOLATE RBC
FOLATE, HEMOLYSATE: 421 ng/mL
Folate, RBC: 1457 ng/mL (ref >498)
HEMATOCRIT: 28.9 % — AB (ref 34.0–46.6)

## 2015-03-05 LAB — AMMONIA: AMMONIA: 21 umol/L (ref 9–35)

## 2015-03-05 LAB — CALCIUM: CALCIUM: 13.4 mg/dL — AB (ref 8.9–10.3)

## 2015-03-05 LAB — MAGNESIUM: MAGNESIUM: 1.5 mg/dL — AB (ref 1.7–2.4)

## 2015-03-05 LAB — RPR: RPR: NONREACTIVE

## 2015-03-05 MED ORDER — ZOLEDRONIC ACID 4 MG/5ML IV CONC
4.0000 mg | Freq: Once | INTRAVENOUS | Status: AC
  Administered 2015-03-05: 4 mg via INTRAVENOUS
  Filled 2015-03-05: qty 5

## 2015-03-05 MED ORDER — HYDRALAZINE HCL 50 MG PO TABS
50.0000 mg | ORAL_TABLET | Freq: Three times a day (TID) | ORAL | Status: DC
  Administered 2015-03-05 – 2015-03-12 (×22): 50 mg via ORAL
  Filled 2015-03-05 (×22): qty 1

## 2015-03-05 MED ORDER — CARVEDILOL 3.125 MG PO TABS: 3.1250 mg | ORAL_TABLET | Freq: Two times a day (BID) | ORAL | Status: DC

## 2015-03-05 MED ORDER — SODIUM CHLORIDE 0.9 % IV SOLN
INTRAVENOUS | Status: AC
  Administered 2015-03-05 – 2015-03-06 (×4): via INTRAVENOUS

## 2015-03-05 MED ORDER — FUROSEMIDE 10 MG/ML IJ SOLN
20.0000 mg | Freq: Four times a day (QID) | INTRAMUSCULAR | Status: DC
  Administered 2015-03-05 – 2015-03-06 (×2): 20 mg via INTRAVENOUS
  Filled 2015-03-05 (×3): qty 4

## 2015-03-05 MED ORDER — HYDRALAZINE HCL 20 MG/ML IJ SOLN
10.0000 mg | Freq: Four times a day (QID) | INTRAMUSCULAR | Status: DC | PRN
Start: 2015-03-05 — End: 2015-03-12
  Administered 2015-03-08 (×3): 10 mg via INTRAVENOUS
  Filled 2015-03-05 (×3): qty 1

## 2015-03-05 MED ORDER — CARVEDILOL 3.125 MG PO TABS
3.1250 mg | ORAL_TABLET | Freq: Two times a day (BID) | ORAL | Status: DC
  Administered 2015-03-06 – 2015-03-08 (×5): 3.125 mg via ORAL
  Filled 2015-03-05 (×5): qty 1

## 2015-03-05 MED ORDER — CALCITONIN (SALMON) 200 UNIT/ML IJ SOLN
100.0000 [IU] | Freq: Every day | INTRAMUSCULAR | Status: AC
  Administered 2015-03-05: 100 [IU] via INTRAMUSCULAR
  Filled 2015-03-05 (×3): qty 0.5

## 2015-03-05 MED ORDER — SODIUM CHLORIDE 0.9 % IV BOLUS (SEPSIS)
1000.0000 mL | Freq: Once | INTRAVENOUS | Status: AC
  Administered 2015-03-05: 1000 mL via INTRAVENOUS

## 2015-03-05 NOTE — Progress Notes (Signed)
Paged MD regarding pt's calcium level from 15.0 to 13.4. Lab called for critical result.  Ave Filter, RN

## 2015-03-05 NOTE — Progress Notes (Signed)
Dr. Candiss Norse aware of patient's elevated calcium level this AM. MD ok for foley to be insert at this time for monitoring I/O. Family updated with plan of care.   Ave Filter, RN

## 2015-03-05 NOTE — Consult Note (Signed)
Reason for Consult: Hypercalcemia and renal failure Referring Physician: Dr. Lala Lund  Chief Complaint: Confusion  Assessment/Plan: 1. 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 PTHrP, SPEP, UPEP and SFLC already requested by Dr. Candiss Norse. Patient has mild dementia according to the daughter [name] [id number]) but mental status certainly worsened over the past few days. Likely has either multiple myeloma vs lung CA.  - Aggressively hydrate with isotonic fluids as you are doing. - Patient with a history of CHF and is on Lasix at home --> CXR also shows vascular congestion. - Given Calcitonin --> may monitor response - if she is responsive then may give again later in the day. - Will also give Zometa 77m IV x1; more of a delayed response but there is tachyphylaxis with the Calcitonin and less of a durable response. 2. Renal failure - partly from volume status, may be intravascularly on the dry side based on history and exam. Need to monitor carefully with the aggressive hydration (given appropriately) with the h/o CHF. Also likely has a component of infiltrative disease with evidence of proteinuria on exam. - hydration as you are already doing. - will check a renal ultrasound also --> sometimes infiltrative kidney diseases can lead to larger kidneys. Also r/o obstruction which is lower on the differential. - no acute indication for dialysis. 3. COPD 4. DM 5. CASHD 6. RAS 7. Mild dementia     HPI: [name] SAVICHis an 65y.o. female with a history of CHF and CASHD on Lasix at home as well as mild dementia per daughter TLavella Lemonsnow presenting with weakness and worsening of mental status with confusion. She is on home O2 and has presumably CKD III with no weight loss noted; although recently she has had decreased PO intake. She fell while trying to get to the bathroom with no reported trauma of LOC. Per family, no reported nausea, vomiting, diarrhea, fever, cough. She was found to  have a calcium in the 15 range where in late December the calcium was in the lower range. She has  a baseline Cr of 1.5-1.9 and was found to have a creatinine in the 2's. No reported NSAID use or dysuria or obstructive like symptoms.  ROS Pertinent items are noted in HPI.  Chemistry and CBC: CREATININE  Date/Time Value Ref Range Status  09/09/2012 01:32 PM 4.77* 0.50 - 1.10 mg/dL Final  12/27/2011 09:23 AM 1.5* 0.6 - 1.1 mg/dL Final  11/28/2011 10:39 AM 1.3* 0.6 - 1.1 mg/dL Final   CREAT  Date/Time Value Ref Range Status  02/17/2015 02:37 PM 1.47* 0.50 - 0.99 mg/dL Final  02/04/2015 03:51 PM 1.33* 0.50 - 0.99 mg/dL Final  01/08/2015 11:50 AM 1.32* 0.50 - 0.99 mg/dL Final  01/02/2015 10:38 AM 1.26* 0.50 - 0.99 mg/dL Final  12/11/2014 12:08 PM 1.52* 0.50 - 0.99 mg/dL Final   CREATININE, SER  Date/Time Value Ref Range Status  03/05/2015 08:55 AM 2.58* 0.44 - 1.00 mg/dL Final  03/04/2015 03:10 PM 1.98* 0.44 - 1.00 mg/dL Final  03/04/2015 09:52 AM 1.90* 0.44 - 1.00 mg/dL Final  01/28/2015 02:58 AM 1.71* 0.44 - 1.00 mg/dL Final  01/27/2015 05:15 AM 1.93* 0.44 - 1.00 mg/dL Final  01/26/2015 03:43 AM 1.96* 0.44 - 1.00 mg/dL Final  01/25/2015 02:55 AM 1.89* 0.44 - 1.00 mg/dL Final  01/24/2015 02:51 AM 1.63* 0.44 - 1.00 mg/dL Final  01/23/2015 05:32 AM 1.39* 0.44 - 1.00 mg/dL Final  01/21/2015 07:51 PM 1.61* 0.44 - 1.00 mg/dL Final  12/26/2014 03:36 AM 1.84* 0.44 - 1.00 mg/dL Final  12/25/2014 05:22 AM 1.70* 0.44 - 1.00 mg/dL Final  12/24/2014 06:50 PM 1.62* 0.44 - 1.00 mg/dL Final  12/24/2014 10:30 AM 1.63* 0.44 - 1.00 mg/dL Final  11/09/2014 04:22 AM 1.75* 0.44 - 1.00 mg/dL Final  11/08/2014 06:30 AM 1.88* 0.44 - 1.00 mg/dL Final  11/07/2014 03:25 AM 1.65* 0.44 - 1.00 mg/dL Final  09/10/2012 04:51 AM 4.40* 0.50 - 1.10 mg/dL Final  09/09/2012 05:15 AM 4.67* 0.50 - 1.10 mg/dL Final  09/08/2012 07:11 AM 4.86* 0.50 - 1.10 mg/dL Final  09/07/2012 07:30 PM 4.82* 0.50 - 1.10 mg/dL Final   01/28/2012 06:05 AM 0.96 0.50 - 1.10 mg/dL Final  01/26/2012 06:05 AM 1.36* 0.50 - 1.10 mg/dL Final  01/25/2012 12:15 PM 1.69* 0.50 - 1.10 mg/dL Final  11/04/2011 08:30 AM 1.09 0.50 - 1.10 mg/dL Final  11/03/2011 11:39 AM 1.51* 0.50 - 1.10 mg/dL Final  10/29/2011 10:02 AM 1.33* 0.50 - 1.10 mg/dL Final  05/09/2011 11:25 AM 1.27* 0.50 - 1.10 mg/dL Final  04/04/2011 01:35 PM 1.27* 0.50 - 1.10 mg/dL Final  06/23/2010 01:30 PM 1.06 0.40 - 1.20 mg/dL Final  11/12/2009 08:30 PM 0.96 0.40-1.20 mg/dL Final  11/07/2009 04:34 AM 0.86 0.4 - 1.2 mg/dL Final  11/06/2009 05:30 AM 0.78 0.4 - 1.2 mg/dL Final  11/05/2009 08:10 AM 0.76 0.4 - 1.2 mg/dL Final  11/05/2009 01:20 AM 0.7 0.4 - 1.2 mg/dL Final  10/30/2009 11:06 AM 0.80 0.40 - 1.20 mg/dL Final  04/22/2009 01:37 PM 0.99 0.40 - 1.20 mg/dL Final  02/24/2009 11:36 AM 0.93 0.40 - 1.20 mg/dL Final  02/08/2009 08:20 AM 2.05* 0.4 - 1.2 mg/dL Final  12/13/2008 05:55 PM 0.7 0.4 - 1.2 mg/dL Final  12/13/2008 05:44 PM 1.2 0.4 - 1.2 mg/dL Final  12/11/2008 11:20 AM 0.91 0.40 - 1.20 mg/dL Final  10/06/2008 02:13 PM 0.96 0.40 - 1.20 mg/dL Final  08/06/2008 10:37 AM 0.94 0.40 - 1.20 mg/dL Final  07/19/2008 05:00 AM 0.85 0.4 - 1.2 mg/dL Final  07/18/2008 02:45 PM 0.88 0.4 - 1.2 mg/dL Final  06/29/2008 05:00 AM 0.74 0.4 - 1.2 mg/dL Final  06/28/2008 05:45 AM 0.58 0.4 - 1.2 mg/dL Final  06/27/2008 11:30 PM 0.66 0.4 - 1.2 mg/dL Final  06/27/2008 06:15 PM 0.82 0.4 - 1.2 mg/dL Final  06/27/2008 06:13 PM 0.9 0.4 - 1.2 mg/dL Final  03/09/2007 10:34 AM 0.98 0.40 - 1.20 mg/dL Final    Recent Labs Lab 03/04/15 0952 03/04/15 1510 03/05/15 0855  NA 139  --  142  K 4.2  --  4.1  CL 97*  --  100*  CO2  --   --  33*  GLUCOSE 137*  --  90  BUN 42*  --  48*  CREATININE 1.90* 1.98* 2.58*  CALCIUM  --   --  >15.0*    Recent Labs Lab 03/04/15 0929 03/04/15 0952 03/04/15 1510 03/05/15 0855  WBC 8.5  --  7.6 8.9  NEUTROABS 7.0  --   --   --   HGB 10.3*  11.2* 9.6* 9.2*  HCT 32.5* 33.0* 30.8* 29.4*  MCV 95.3  --  94.8 94.5  PLT 217  --  204 209   Liver Function Tests:  Recent Labs Lab 03/05/15 0855  AST 20  ALT 11*  ALKPHOS 59  BILITOT 0.4  PROT 5.3*  ALBUMIN 2.5*   No results for input(s): LIPASE, AMYLASE in the last 168 hours.  Recent Labs Lab 03/04/15 1510 03/05/15  0973  AMMONIA 13 21   Cardiac Enzymes: No results for input(s): CKTOTAL, CKMB, CKMBINDEX, TROPONINI in the last 168 hours. Iron Studies: No results for input(s): IRON, TIBC, TRANSFERRIN, FERRITIN in the last 72 hours. PT/INR: _0 (inr:5)  Xrays/Other Studies: ) Results for orders placed or performed during the hospital encounter of 03/04/15 (from the past 48 hour(s))  CBG monitoring, ED     Status: Abnormal   Collection Time: 03/04/15  9:27 AM  Result Value Ref Range   Glucose-Capillary 136 (H) 65 - 99 mg/dL  CBC with Differential/Platelet     Status: Abnormal   Collection Time: 03/04/15  9:29 AM  Result Value Ref Range   WBC 8.5 4.0 - 10.5 K/uL   RBC 3.41 (L) 3.87 - 5.11 MIL/uL   Hemoglobin 10.3 (L) 12.0 - 15.0 g/dL   HCT 32.5 (L) 36.0 - 46.0 %   MCV 95.3 78.0 - 100.0 fL   MCH 30.2 26.0 - 34.0 pg   MCHC 31.7 30.0 - 36.0 g/dL   RDW 15.0 11.5 - 15.5 %   Platelets 217 150 - 400 K/uL   Neutrophils Relative % 82 %   Neutro Abs 7.0 1.7 - 7.7 K/uL   Lymphocytes Relative 9 %   Lymphs Abs 0.8 0.7 - 4.0 K/uL   Monocytes Relative 7 %   Monocytes Absolute 0.6 0.1 - 1.0 K/uL   Eosinophils Relative 1 %   Eosinophils Absolute 0.1 0.0 - 0.7 K/uL   Basophils Relative 1 %   Basophils Absolute 0.1 0.0 - 0.1 K/uL  I-stat troponin, ED     Status: None   Collection Time: 03/04/15  9:50 AM  Result Value Ref Range   Troponin i, poc 0.03 0.00 - 0.08 ng/mL   Comment 3            Comment: Due to the release kinetics of cTnI, a negative result within the first hours of the onset of symptoms does not rule out myocardial infarction with certainty. If  myocardial infarction is still suspected, repeat the test at appropriate intervals.   I-stat chem 8, ed     Status: Abnormal   Collection Time: 03/04/15  9:52 AM  Result Value Ref Range   Sodium 139 135 - 145 mmol/L   Potassium 4.2 3.5 - 5.1 mmol/L   Chloride 97 (L) 101 - 111 mmol/L   BUN 42 (H) 6 - 20 mg/dL   Creatinine, Ser 1.90 (H) 0.44 - 1.00 mg/dL   Glucose, Bld 137 (H) 65 - 99 mg/dL   Calcium, Ion 2.26 (HH) 1.13 - 1.30 mmol/L   TCO2 38 0 - 100 mmol/L   Hemoglobin 11.2 (L) 12.0 - 15.0 g/dL   HCT 33.0 (L) 36.0 - 46.0 %   Comment NOTIFIED PHYSICIAN   Urinalysis, Routine w reflex microscopic (not at [facility])     Status: Abnormal   Collection Time: 03/04/15 11:08 AM  Result Value Ref Range   Color, Urine YELLOW YELLOW   APPearance CLEAR CLEAR   Specific Gravity, Urine 1.014 1.005 - 1.030   pH 6.5 5.0 - 8.0   Glucose, UA NEGATIVE NEGATIVE mg/dL   Hgb urine dipstick TRACE (A) NEGATIVE   Bilirubin Urine NEGATIVE NEGATIVE   Ketones, ur NEGATIVE NEGATIVE mg/dL   Protein, ur >300 (A) NEGATIVE mg/dL   Nitrite NEGATIVE NEGATIVE   Leukocytes, UA NEGATIVE NEGATIVE  Urine microscopic-add on     Status: Abnormal   Collection Time: 03/04/15 11:08 AM  Result Value Ref Range  Squamous Epithelial / LPF 0-5 (A) NONE SEEN   WBC, UA 0-5 0 - 5 WBC/hpf   RBC / HPF 0-5 0 - 5 RBC/hpf   Bacteria, UA RARE (A) NONE SEEN   Casts HYALINE CASTS (A) NEGATIVE  Urine rapid drug screen (hosp performed)     Status: None   Collection Time: 03/04/15 11:08 AM  Result Value Ref Range   Opiates NONE DETECTED NONE DETECTED   Cocaine NONE DETECTED NONE DETECTED   Benzodiazepines NONE DETECTED NONE DETECTED   Amphetamines NONE DETECTED NONE DETECTED   Tetrahydrocannabinol NONE DETECTED NONE DETECTED   Barbiturates NONE DETECTED NONE DETECTED    Comment:        DRUG SCREEN FOR MEDICAL PURPOSES ONLY.  IF CONFIRMATION IS NEEDED FOR ANY PURPOSE, NOTIFY LAB WITHIN 5 DAYS.        LOWEST DETECTABLE LIMITS FOR  URINE DRUG SCREEN Drug Class       Cutoff (ng/mL) Amphetamine      1000 Barbiturate      200 Benzodiazepine   161 Tricyclics       096 Opiates          300 Cocaine          300 THC              50   Vitamin B12     Status: None   Collection Time: 03/04/15  3:10 PM  Result Value Ref Range   Vitamin B-12 564 180 - 914 pg/mL    Comment: (NOTE) This assay is not validated for testing neonatal or myeloproliferative syndrome specimens for Vitamin B12 levels.   RPR     Status: None   Collection Time: 03/04/15  3:10 PM  Result Value Ref Range   RPR Ser Ql Non Reactive Non Reactive    Comment: (NOTE) Performed At: [facility] [place], [place] [id number] Lindon Romp MD BJ:[id number]   Ammonia     Status: None   Collection Time: 03/04/15  3:10 PM  Result Value Ref Range   Ammonia 13 9 - 35 umol/L  Lactic acid, plasma     Status: None   Collection Time: 03/04/15  3:10 PM  Result Value Ref Range   Lactic Acid, Venous 0.9 0.5 - 2.0 mmol/L  CBC     Status: Abnormal   Collection Time: 03/04/15  3:10 PM  Result Value Ref Range   WBC 7.6 4.0 - 10.5 K/uL   RBC 3.25 (L) 3.87 - 5.11 MIL/uL   Hemoglobin 9.6 (L) 12.0 - 15.0 g/dL   HCT 30.8 (L) 36.0 - 46.0 %   MCV 94.8 78.0 - 100.0 fL   MCH 29.5 26.0 - 34.0 pg   MCHC 31.2 30.0 - 36.0 g/dL   RDW 14.8 11.5 - 15.5 %   Platelets 204 150 - 400 K/uL  Creatinine, serum     Status: Abnormal   Collection Time: 03/04/15  3:10 PM  Result Value Ref Range   Creatinine, Ser 1.98 (H) 0.44 - 1.00 mg/dL   GFR calc non Af Amer 26 (L) >60 mL/min   GFR calc Af Amer 30 (L) >60 mL/min    Comment: (NOTE) The eGFR has been calculated using the CKD EPI equation. This calculation has not been validated in all clinical situations. eGFR's persistently <60 mL/min signify possible Chronic Kidney Disease.   TSH     Status: None   Collection Time: 03/04/15  3:10 PM  Result Value Ref Range  TSH 0.714 0.350 - 4.500 uIU/mL   Hemoglobin A1c     Status: Abnormal   Collection Time: 03/04/15  3:10 PM  Result Value Ref Range   Hgb A1c MFr Bld 6.2 (H) 4.8 - 5.6 %    Comment: (NOTE)         Pre-diabetes: 5.7 - 6.4         Diabetes: >6.4         Glycemic control for adults with diabetes: <7.0    Mean Plasma Glucose 131 mg/dL    Comment: (NOTE) Performed At: [facility] [place], [place] [id number] Lindon Romp MD WS:[id number]   CBG monitoring, ED     Status: Abnormal   Collection Time: 03/04/15  5:43 PM  Result Value Ref Range   Glucose-Capillary 123 (H) 65 - 99 mg/dL  Lactic acid, plasma     Status: None   Collection Time: 03/04/15  6:52 PM  Result Value Ref Range   Lactic Acid, Venous 1.3 0.5 - 2.0 mmol/L  Glucose, capillary     Status: None   Collection Time: 03/04/15 11:20 PM  Result Value Ref Range   Glucose-Capillary 74 65 - 99 mg/dL   Comment 1 Notify RN    Comment 2 Document in Chart   Glucose, capillary     Status: Abnormal   Collection Time: 03/05/15  6:57 AM  Result Value Ref Range   Glucose-Capillary 103 (H) 65 - 99 mg/dL   Comment 1 Notify RN    Comment 2 Document in Chart   Comprehensive metabolic panel     Status: Abnormal   Collection Time: 03/05/15  8:55 AM  Result Value Ref Range   Sodium 142 135 - 145 mmol/L   Potassium 4.1 3.5 - 5.1 mmol/L   Chloride 100 (L) 101 - 111 mmol/L   CO2 33 (H) 22 - 32 mmol/L   Glucose, Bld 90 65 - 99 mg/dL   BUN 48 (H) 6 - 20 mg/dL   Creatinine, Ser 2.58 (H) 0.44 - 1.00 mg/dL   Calcium >15.0 (HH) 8.9 - 10.3 mg/dL    Comment: REPEATED TO VERIFY CRITICAL RESULT CALLED TO, READ BACK BY AND VERIFIED WITH: H MACINTOSH,RN AT 1013 ON 01.12.17 BY W JOHNSON    Total Protein 5.3 (L) 6.5 - 8.1 g/dL   Albumin 2.5 (L) 3.5 - 5.0 g/dL   AST 20 15 - 41 U/L   ALT 11 (L) 14 - 54 U/L   Alkaline Phosphatase 59 38 - 126 U/L   Total Bilirubin 0.4 0.3 - 1.2 mg/dL   GFR calc non Af Amer 19 (L) >60 mL/min   GFR calc Af Amer 21 (L) >60  mL/min    Comment: (NOTE) The eGFR has been calculated using the CKD EPI equation. This calculation has not been validated in all clinical situations. eGFR's persistently <60 mL/min signify possible Chronic Kidney Disease.    Anion gap 9 5 - 15  CBC     Status: Abnormal   Collection Time: 03/05/15  8:55 AM  Result Value Ref Range   WBC 8.9 4.0 - 10.5 K/uL   RBC 3.11 (L) 3.87 - 5.11 MIL/uL   Hemoglobin 9.2 (L) 12.0 - 15.0 g/dL   HCT 29.4 (L) 36.0 - 46.0 %   MCV 94.5 78.0 - 100.0 fL   MCH 29.6 26.0 - 34.0 pg   MCHC 31.3 30.0 - 36.0 g/dL   RDW 14.9 11.5 - 15.5 %   Platelets 209  150 - 400 K/uL  Magnesium     Status: Abnormal   Collection Time: 03/05/15  8:55 AM  Result Value Ref Range   Magnesium 1.5 (L) 1.7 - 2.4 mg/dL  Ammonia     Status: None   Collection Time: 03/05/15  8:55 AM  Result Value Ref Range   Ammonia 21 9 - 35 umol/L   Dg Chest 2 View  03/04/2015  CLINICAL DATA:  Altered mental status today. Bilateral upper extremity weakness. EXAM: CHEST  2 VIEW COMPARISON:  PA and lateral chest 01/21/2015 and 09/07/2012. CT chest 12/14/2011. FINDINGS: There is cardiomegaly and mild vascular congestion. No consolidative process, pneumothorax or effusion is identified. IMPRESSION: No acute disease. Cardiomegaly and vascular congestion. Electronically Signed   By: Inge Rise M.D.   On: 03/04/2015 10:25   Ct Head Wo Contrast  03/04/2015  CLINICAL DATA:  Altered mental status, weakness, difficulty with ambulation EXAM: CT HEAD WITHOUT CONTRAST TECHNIQUE: Contiguous axial images were obtained from the base of the skull through the vertex without intravenous contrast. COMPARISON:  08/10/2004 FINDINGS: There is no evidence of mass effect, midline shift, or extra-axial fluid collections. There is no evidence of a space-occupying lesion or intracranial hemorrhage. There is no evidence of a cortical-based area of acute infarction. There is generalized cerebral atrophy. There is  periventricular white matter low attenuation likely secondary to microangiopathy. The ventricles and sulci are appropriate for the patient's age. The basal cisterns are patent. Visualized portions of the orbits are unremarkable. The visualized portions of the paranasal sinuses and mastoid air cells are unremarkable. Cerebrovascular atherosclerotic calcifications are noted. The osseous structures are unremarkable. IMPRESSION: 1. No acute intracranial pathology. 2. Chronic microvascular disease and cerebral atrophy. Electronically Signed   By: Kathreen Devoid   On: 03/04/2015 10:36    PMH:   Past Medical History  Diagnosis Date  . Peripheral vascular disease ([place])     a.s /p left common iliac and external iliac artery stents. b. H/o subclavian, mesenteric, and celiac artery stenosis (see below).  . Diabetes mellitus   . COPD (chronic obstructive pulmonary disease) ([facility])     home oxygen  . Hyperlipidemia   . Depression   . Chronic diastolic CHF (congestive heart failure) ([place])   . Essential hypertension   . Renal artery stenosis ([place])     a. Duplex 2014: no obvious evidence of hemodynamically significant stenosis >60%, bilateral intrarenal arteries exhibit absent diastolic flow. There is evidence of elevated velocities of the mid aorta. 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  [name] CAD (coronary artery disease)   . Iron deficiency anemia   . Diabetic retinopathy ([place])   . 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 Carotid artery disease ([place])     a. Duplex 12/2014: left CEA patent with elevated velocities, 60-79% RICA (stable over serial exams), 40-98% LICA stenosis (stable over serial exams), 50% LECA, elevated velocities in bilateral subclavian arteries.  . Hypertensive heart disease with congestive heart failure ([place])   . CKD (chronic kidney disease), stage III    . Tobacco abuse   . Bradycardia     a. Limiting BB titration.  . Anemia, chronic disease 05/16/2008    Qualifier: Diagnosis of  By: Percival Spanish, MD, Farrel Gordon    . Dementia   . Protein-calorie malnutrition ([place]) 01/24/2015    PSH:   Past Surgical History  Procedure Laterality Date  . Carotid endarterectomy    . Cholecystectomy    . Appendectomy    . Tonsillectomy    . Cesarean section    . Shoulder arthroscopy    . Iliac artery stent      left  . Enteroscopy  01/26/2012    Procedure: ENTEROSCOPY;  Surgeon: Gatha Mayer, MD;  Location: [facility];  Service: Endoscopy;  Laterality: N/A;  . Cardiac catheterization N/A 01/23/2015    Procedure: Left Heart Cath and Coronary Angiography;  Surgeon: [name], MD;  Location: [facility];  Service: Cardiovascular;  Laterality: N/A;    Allergies: No Known Allergies  Medications:   Prior to Admission medications   Medication Sig Start Date End Date Taking? Authorizing Provider  acetaminophen (TYLENOL) 500 MG tablet Take 500 mg by mouth every 6 (six) hours as needed (pain).    Yes Historical Provider, MD  albuterol (PROVENTIL HFA;VENTOLIN HFA) 108 (90 BASE) MCG/ACT inhaler Inhale 2 puffs into the lungs every 6 (six) hours as needed for wheezing or shortness of breath. 02/17/15  Yes Arnoldo Morale, MD  amLODipine (NORVASC) 10 MG tablet Take 1 tablet (10 mg total) by mouth daily. 02/17/15  Yes Arnoldo Morale, MD  aspirin EC 81 MG tablet Take 1 tablet (81 mg total) by mouth daily. 02/17/15  Yes Arnoldo Morale, MD  atorvastatin (LIPITOR) 80 MG tablet Take 1 tablet (80 mg total) by mouth daily at 6 PM. 02/17/15  Yes Arnoldo Morale, MD  Calcium Carbonate Antacid (TUMS PO) Take 1 tablet by mouth daily as needed (acid reflux/heartburn).   Yes Historical Provider, MD  carvedilol (COREG) 12.5 MG tablet Take 1 tablet (12.5 mg total) by mouth 2 (two) times daily with a meal. 02/20/15  Yes Isaiah Serge, NP  cloNIDine (CATAPRES) 0.1 MG tablet  Take 1 tablet (0.1 mg total) by mouth 2 (two) times daily. 02/17/15  Yes Arnoldo Morale, MD  clopidogrel (PLAVIX) 75 MG tablet Take 1 tablet (75 mg total) by mouth daily. 02/17/15  Yes Arnoldo Morale, MD  Ferrous Sulfate (IRON) 325 (65 FE) MG TABS Take 325 mg by mouth 3 (three) times daily. 02/17/15  Yes Arnoldo Morale, MD  fish oil-omega-3 fatty acids 1000 MG capsule Take 1 g by mouth 3 (three) times daily. Three times daily   Yes Historical Provider, MD  furosemide (LASIX) 80 MG tablet Take 1 tablet (80 mg total) by mouth daily. 02/20/15  Yes Isaiah Serge, NP  isosorbide mononitrate (IMDUR) 30 MG 24 hr tablet Take 1 tablet (30 mg total) by mouth daily. 02/17/15  Yes Arnoldo Morale, MD  nitroGLYCERIN (NITROSTAT) 0.4 MG SL tablet Place 1 tablet (0.4 mg total) under the tongue every 5 (five) minutes as needed for chest pain. 01/28/15  Yes Delfina Redwood, MD  sertraline (ZOLOFT) 50 MG tablet Take 1 tablet (50 mg total) by mouth daily. 02/04/15  Yes Arnoldo Morale, MD  Fluticasone Furoate-Vilanterol (BREO ELLIPTA) 100-25 MCG/INH AEPB Inhale 1 Inhaler into the lungs daily. Patient not taking: Reported on 03/04/2015 02/17/15   Arnoldo Morale, MD  hydrocortisone 2.5 % lotion Apply topically 2 (two) times daily. Patient not taking: Reported on 03/04/2015 01/08/15   Dorena Dew, FNP    Discontinued Meds:   Medications Discontinued During This Encounter  Medication Reason  . TRUEPLUS LANCETS 28G MISC Error  . glucose blood (TRUE METRIX BLOOD GLUCOSE TEST) test strip Error  . Blood Glucose Monitoring Suppl (TRUE METRIX METER) DEVI Error  .  losartan (COZAAR) 50 MG tablet Discontinued by provider  . metoprolol (LOPRESSOR) 50 MG tablet Discontinued by provider  . KIONEX 15 GM/60ML suspension Not available  . permethrin (ELIMITE) 5 % cream Not available  . albuterol (PROVENTIL HFA;VENTOLIN HFA) 108 (90 Base) MCG/ACT inhaler 2 puff Formulary change  . sodium chloride 0.9 % injection 3 mL   . sodium chloride  0.9 % injection 3 mL   . 0.9 %  sodium chloride infusion     Social History:  reports that she quit smoking about 3 weeks ago. Her smoking use included Cigarettes. She smoked 0.00 packs per day for 36 years. She has never used smokeless tobacco. She reports that she does not drink alcohol or use illicit drugs.  Family History:   Family History  Problem Relation Age of Onset  . Hypertension Mother   . Heart attack Mother   . Hypertension Father   . Angina Father   . Cancer Sister   . Cancer Brother   . Stroke Neg Hx     Blood pressure 177/66, pulse 57, temperature 97.8 F (36.6 C), temperature source Oral, resp. rate 16, height _0  (1.473 m), weight 61.961 kg (136 lb 9.6 oz), SpO2 100 %. General appearance: cooperative and confused Head: Normocephalic, without obvious abnormality, atraumatic Eyes: negative Throat: no exudates Neck: no adenopathy, no carotid bruit, supple, symmetrical, trachea midline and thyroid not enlarged, symmetric, no tenderness/mass/nodules Back: symmetric, no curvature. ROM normal. No CVA tenderness. Resp: poor air movement Chest wall: no tenderness Cardio: regular rate and rhythm, S1, S2 normal, no murmur, click, rub or gallop GI: soft, non-tender; bowel sounds normal; no masses,  no organomegaly Extremities: edema none Pulses: 2+ and symmetric Skin: Skin color, texture, turgor normal. No rashes or lesions Lymph nodes: Cervical, supraclavicular, and axillary nodes normal. Neurologic: Mental status: disoriented       Dwana Melena, MD 03/05/2015, 10:56 AM

## 2015-03-05 NOTE — Progress Notes (Signed)
Patient Demographics:    [name], is a 65 y.o. female, DOB - 16-Sep-1950, [id number]  Admit date - 03/04/2015   Admitting Physician Waldemar Dickens, MD  Outpatient Primary MD for the patient is [name], MD  LOS -    Chief Complaint  Patient presents with  . Altered Mental Status  . Weakness        Subjective:    [name] today has, No headache, No chest pain, No abdominal pain - No Nausea, No new weakness tingling or numbness, No Cough - SOB. But appears confused and unreliable historian.   Assessment  & Plan :     1. Severe metabolic encephalopathy due to severe hypercalcemia with dehydration and acute renal failure. Continue aggressive hydration, normal saline bolus and maintenance, IV Lasix, someone calcitonin, Palmidronate. Renal consulted. Will order PTH, PTH RP, ACE level, SPEP, UPEP, Vit D levels, calcitriol levels. Monitor CMP closely.   2. Essential hypertension. Continue on Norvasc, Imdur, lower Coreg dose, hold clonidine as she was bradycardic.   3. ARF and CK D stage III. Plan as in #1 above.   4. UPT on home oxygen, at baseline, continue supportive care.   5. Chronic diastolic dysfunction EF 123456 on rest and call. Currently mechanically compensated. Fluid and Lasix due to #1 above.   6. Mild asymptomatic bradycardia upon admission. Drop Coreg dose, discontinue clonidine. Monitor.    7. DM type II. Check A1c place on sliding scale.  Lab Results  Component Value Date   HGBA1C 6.2* 03/04/2015    CBG (last 3)   Recent Labs  03/04/15 2320 03/05/15 0657 03/05/15 1128  GLUCAP 74 103* 113*        Code Status : Full  Family Communication  : None  Disposition Plan  : To be decided   Consults  : Nephrology  Procedures  : CT head.  Nonacute  DVT Prophylaxis  :  Lovenox    Lab Results  Component Value Date   PLT 209 03/05/2015    Inpatient Medications  Scheduled Meds: . amLODipine  10 mg Oral Daily  . aspirin EC  81 mg Oral Daily  . atorvastatin  80 mg Oral q1800  . calcitonin  100 Units Intramuscular Daily  . cloNIDine  0.1 mg Oral BID  . clopidogrel  75 mg Oral Daily  . enoxaparin (LOVENOX) injection  30 mg Subcutaneous Q24H  . ferrous sulfate  325 mg Oral TID  . furosemide  20 mg Intravenous Q6H  . insulin aspart  0-5 Units Subcutaneous QHS  . insulin aspart  0-9 Units Subcutaneous TID WC  . isosorbide mononitrate  30 mg Oral Daily  . mometasone-formoterol  1 puff Inhalation Daily  . sodium chloride  1,000 mL Intravenous Once  . sodium chloride  3 mL Intravenous Q12H   Continuous Infusions: . sodium chloride 150 mL/hr at 03/05/15 1217   PRN Meds:.acetaminophen **OR** acetaminophen, albuterol, ondansetron **OR** ondansetron (ZOFRAN) IV  Antibiotics  :    Anti-infectives    None        Objective:   Filed Vitals:   03/05/15 0600 03/05/15 0652 03/05/15 0730 03/05/15 0959  BP: 191/52  174/54 177/66  Pulse: 55  50 57  Temp: 98.1 F (  36.7 C)  98.1 F (36.7 C) 97.8 F (36.6 C)  TempSrc: Oral  Axillary Oral  Resp: 14  16 16   Height:      Weight:  61.961 kg (136 lb 9.6 oz)    SpO2: 94%  92% 100%    Wt Readings from Last 3 Encounters:  03/05/15 61.961 kg (136 lb 9.6 oz)  02/20/15 55.067 kg (121 lb 6.4 oz)  02/17/15 58.423 kg (128 lb 12.8 oz)     Intake/Output Summary (Last 24 hours) at 03/05/15 1307 Last data filed at 03/05/15 1218  Gross per 24 hour  Intake 1510.67 ml  Output      0 ml  Net 1510.67 ml     Physical Exam  Awake , but confused, No new F.N deficits, Normal affect [place].AT,PERRAL Supple Neck,No JVD, No cervical lymphadenopathy appriciated.  Symmetrical Chest wall movement, Good air movement bilaterally, CTAB RRR,No Gallops,Rubs or new Murmurs, No Parasternal  Heave +ve B.Sounds, Abd Soft, No tenderness, No organomegaly appriciated, No rebound - guarding or rigidity. No Cyanosis, Clubbing or edema, No new Rash or bruise      Data Review:   Micro Results No results found for this or any previous visit (from the past 240 hour(s)).  Radiology Reports Dg Chest 2 View  03/04/2015  CLINICAL DATA:  Altered mental status today. Bilateral upper extremity weakness. EXAM: CHEST  2 VIEW COMPARISON:  PA and lateral chest 01/21/2015 and 09/07/2012. CT chest 12/14/2011. FINDINGS: There is cardiomegaly and mild vascular congestion. No consolidative process, pneumothorax or effusion is identified. IMPRESSION: No acute disease. Cardiomegaly and vascular congestion. Electronically Signed   By: Inge Rise M.D.   On: 03/04/2015 10:25   Ct Head Wo Contrast  03/04/2015  CLINICAL DATA:  Altered mental status, weakness, difficulty with ambulation EXAM: CT HEAD WITHOUT CONTRAST TECHNIQUE: Contiguous axial images were obtained from the base of the skull through the vertex without intravenous contrast. COMPARISON:  08/10/2004 FINDINGS: There is no evidence of mass effect, midline shift, or extra-axial fluid collections. There is no evidence of a space-occupying lesion or intracranial hemorrhage. There is no evidence of a cortical-based area of acute infarction. There is generalized cerebral atrophy. There is periventricular white matter low attenuation likely secondary to microangiopathy. The ventricles and sulci are appropriate for the patient's age. The basal cisterns are patent. Visualized portions of the orbits are unremarkable. The visualized portions of the paranasal sinuses and mastoid air cells are unremarkable. Cerebrovascular atherosclerotic calcifications are noted. The osseous structures are unremarkable. IMPRESSION: 1. No acute intracranial pathology. 2. Chronic microvascular disease and cerebral atrophy. Electronically Signed   By: Kathreen Devoid   On: 03/04/2015  10:36     CBC  Recent Labs Lab 03/04/15 0929 03/04/15 0952 03/04/15 1510 03/05/15 0855  WBC 8.5  --  7.6 8.9  HGB 10.3* 11.2* 9.6* 9.2*  HCT 32.5* 33.0* 30.8* 29.4*  PLT 217  --  204 209  MCV 95.3  --  94.8 94.5  MCH 30.2  --  29.5 29.6  MCHC 31.7  --  31.2 31.3  RDW 15.0  --  14.8 14.9  LYMPHSABS 0.8  --   --   --   MONOABS 0.6  --   --   --   EOSABS 0.1  --   --   --   BASOSABS 0.1  --   --   --     Chemistries   Recent Labs Lab 03/04/15 0952 03/04/15 1510 03/05/15 0855  NA  139  --  142  K 4.2  --  4.1  CL 97*  --  100*  CO2  --   --  33*  GLUCOSE 137*  --  90  BUN 42*  --  48*  CREATININE 1.90* 1.98* 2.58*  CALCIUM  --   --  >15.0*  MG  --   --  1.5*  AST  --   --  20  ALT  --   --  11*  ALKPHOS  --   --  59  BILITOT  --   --  0.4   ------------------------------------------------------------------------------------------------------------------ No results for input(s): CHOL, HDL, LDLCALC, TRIG, CHOLHDL, LDLDIRECT in the last 72 hours.  Lab Results  Component Value Date   HGBA1C 6.2* 03/04/2015   ------------------------------------------------------------------------------------------------------------------  Recent Labs  03/04/15 1510  TSH 0.714   ------------------------------------------------------------------------------------------------------------------  Recent Labs  03/04/15 1510  VITAMINB12 564    Coagulation profile No results for input(s): INR, PROTIME in the last 168 hours.  No results for input(s): DDIMER in the last 72 hours.  Cardiac Enzymes No results for input(s): CKMB, TROPONINI, MYOGLOBIN in the last 168 hours.  Invalid input(s): CK ------------------------------------------------------------------------------------------------------------------  Time Spent in minutes   35   SINGH,PRASHANT K M.D on 03/05/2015 at 1:07 PM  Between 7am to 7pm - Pager - [phone]  After 7pm go to [email or web address] - password  [facility]  [facility] -  Office  [phone]

## 2015-03-05 NOTE — Care Management Note (Addendum)
Case Management Note  Patient Details  Name: [name] MRN: [id number] Date of Birth: Feb 16, 1951  Subjective/Objective:                    Action/Plan: Patient was admitted with acute encephalopathy. Lives at home with spouse. Patient is currently listed as self-pay. Will follow for discharge needs pending PT/OT evals and physician orders.    Expected Discharge Date:                  Expected Discharge Plan:     In-House Referral:     Discharge planning Services     Post Acute Care Choice:    Choice offered to:     DME Arranged:    DME Agency:     HH Arranged:    HH Agency:     Status of Service:  In process, will continue to follow  Medicare Important Message Given:    Date Medicare IM Given:    Medicare IM give by:    Date Additional Medicare IM Given:    Additional Medicare Important Message give by:     If discussed at [place] of Stay Meetings, dates discussed:    Additional CommentsRolm Baptise, RN 03/05/2015, 3:06 PM [phone]

## 2015-03-06 ENCOUNTER — Ambulatory Visit: Payer: Self-pay | Admitting: Cardiology

## 2015-03-06 LAB — COMPREHENSIVE METABOLIC PANEL
ALK PHOS: 62 U/L (ref 38–126)
ALT: 11 U/L — AB (ref 14–54)
AST: 20 U/L (ref 15–41)
Albumin: 2.5 g/dL — ABNORMAL LOW (ref 3.5–5.0)
Anion gap: 7 (ref 5–15)
BUN: 45 mg/dL — ABNORMAL HIGH (ref 6–20)
CALCIUM: 12.3 mg/dL — AB (ref 8.9–10.3)
CO2: 29 mmol/L (ref 22–32)
CREATININE: 2.88 mg/dL — AB (ref 0.44–1.00)
Chloride: 107 mmol/L (ref 101–111)
GFR, EST AFRICAN AMERICAN: 19 mL/min — AB (ref >60)
GFR, EST NON AFRICAN AMERICAN: 16 mL/min — AB (ref >60)
Glucose, Bld: 86 mg/dL (ref 65–99)
Potassium: 3.6 mmol/L (ref 3.5–5.1)
Sodium: 143 mmol/L (ref 135–145)
Total Bilirubin: 0.5 mg/dL (ref 0.3–1.2)
Total Protein: 5.5 g/dL — ABNORMAL LOW (ref 6.5–8.1)

## 2015-03-06 LAB — GLUCOSE, CAPILLARY
GLUCOSE-CAPILLARY: 83 mg/dL (ref 65–99)
Glucose-Capillary: 85 mg/dL (ref 65–99)
Glucose-Capillary: 129 mg/dL — ABNORMAL HIGH (ref 65–99)
Glucose-Capillary: 83 mg/dL (ref 65–99)

## 2015-03-06 LAB — PROTEIN ELECTROPHORESIS, SERUM
A/G Ratio: 1 (ref 0.7–1.7)
ALPHA-1-GLOBULIN: 0.3 g/dL (ref 0.0–0.4)
ALPHA-2-GLOBULIN: 0.9 g/dL (ref 0.4–1.0)
Albumin ELP: 2.6 g/dL — ABNORMAL LOW (ref 2.9–4.4)
Beta Globulin: 0.9 g/dL (ref 0.7–1.3)
GAMMA GLOBULIN: 0.5 g/dL (ref 0.4–1.8)
Globulin, Total: 2.5 g/dL (ref 2.2–3.9)
TOTAL PROTEIN ELP: 5.1 g/dL — AB (ref 6.0–8.5)

## 2015-03-06 LAB — CBC
HCT: 29.9 % — ABNORMAL LOW (ref 36.0–46.0)
Hemoglobin: 9.5 g/dL — ABNORMAL LOW (ref 12.0–15.0)
MCH: 30.2 pg (ref 26.0–34.0)
MCHC: 31.8 g/dL (ref 30.0–36.0)
MCV: 94.9 fL (ref 78.0–100.0)
PLATELETS: 202 10*3/uL (ref 150–400)
RBC: 3.15 MIL/uL — ABNORMAL LOW (ref 3.87–5.11)
RDW: 14.7 % (ref 11.5–15.5)
WBC: 8.8 10*3/uL (ref 4.0–10.5)

## 2015-03-06 LAB — MAGNESIUM: MAGNESIUM: 1.4 mg/dL — AB (ref 1.7–2.4)

## 2015-03-06 LAB — PARATHYROID HORMONE, INTACT (NO CA): PTH: 9 pg/mL — ABNORMAL LOW (ref 15–65)

## 2015-03-06 LAB — CALCIUM, IONIZED: CALCIUM, IONIZED, SERUM: 9 mg/dL — AB (ref 4.5–5.6)

## 2015-03-06 LAB — ANGIOTENSIN CONVERTING ENZYME: ANGIOTENSIN-CONVERTING ENZYME: 34 U/L (ref 14–82)

## 2015-03-06 MED ORDER — SODIUM CHLORIDE 0.9 % IV SOLN
INTRAVENOUS | Status: DC
  Administered 2015-03-06: 13:00:00 via INTRAVENOUS

## 2015-03-06 MED ORDER — SODIUM CHLORIDE 0.9 % IV SOLN
INTRAVENOUS | Status: DC
  Administered 2015-03-06: 21:00:00 via INTRAVENOUS

## 2015-03-06 MED ORDER — MAGNESIUM SULFATE 2 GM/50ML IV SOLN
2.0000 g | Freq: Once | INTRAVENOUS | Status: AC
  Administered 2015-03-06: 2 g via INTRAVENOUS
  Filled 2015-03-06: qty 50

## 2015-03-06 NOTE — Progress Notes (Signed)
Patient Demographics:    [name], is a 65 y.o. female, DOB - 08-Jan-1951, [id number]  Admit date - 03/04/2015   Admitting Physician Waldemar Dickens, MD  Outpatient Primary MD for the patient is [name], MD  LOS - 1   Chief Complaint  Patient presents with  . Altered Mental Status  . Weakness        Subjective:    [name] today has, No headache, No chest pain, No abdominal pain - No Nausea, No new weakness tingling or numbness, No Cough - SOB. But appears confused and unreliable historian.   Assessment  & Plan :     1. Severe metabolic encephalopathy due to severe hypercalcemia with dehydration and acute renal failure. Continue aggressive hydration, normal saline bolus and maintenance, stop Lasix on day 2, someone calcitonin, Palmidronate. Renal consulted. Will order PTH, PTH RP, ACE level, SPEP, UPEP, Vit D levels, calcitriol levels. Monitor CMP closely.   2. Essential hypertension. Continue on Norvasc, Imdur, lower Coreg dose, hold clonidine as she was bradycardic.   3. ARF and CK D stage III. Plan as in #1 above.   4. UPT on home oxygen, at baseline, continue supportive care.   5. Chronic diastolic dysfunction EF 123456 on rest and call. Currently mechanically compensated. Fluid and Lasix due to #1 above.   6. Mild asymptomatic bradycardia upon admission. Drop Coreg dose, discontinue clonidine. Monitor.    7. DM type II. Check A1c place on sliding scale.  Lab Results  Component Value Date   HGBA1C 6.2* 03/04/2015    CBG (last 3)   Recent Labs  03/05/15 2235 03/06/15 0650 03/06/15 1210  GLUCAP 88 85 129*        Code Status : Full  Family Communication  : Family  Disposition Plan  : To be decided   Consults  : Nephrology  Procedures  : CT  head. Nonacute  DVT Prophylaxis  :  Lovenox    Lab Results  Component Value Date   PLT 202 03/06/2015    Inpatient Medications  Scheduled Meds: . amLODipine  10 mg Oral Daily  . aspirin EC  81 mg Oral Daily  . atorvastatin  80 mg Oral q1800  . carvedilol  3.125 mg Oral BID WC  . clopidogrel  75 mg Oral Daily  . enoxaparin (LOVENOX) injection  30 mg Subcutaneous Q24H  . ferrous sulfate  325 mg Oral TID  . hydrALAZINE  50 mg Oral 3 times per day  . insulin aspart  0-5 Units Subcutaneous QHS  . insulin aspart  0-9 Units Subcutaneous TID WC  . isosorbide mononitrate  30 mg Oral Daily  . mometasone-formoterol  1 puff Inhalation Daily  . sodium chloride  3 mL Intravenous Q12H   Continuous Infusions: . sodium chloride 125 mL/hr at 03/06/15 1238  . sodium chloride     PRN Meds:.acetaminophen **OR** acetaminophen, albuterol, hydrALAZINE, [DISCONTINUED] ondansetron **OR** ondansetron (ZOFRAN) IV  Antibiotics  :    Anti-infectives    None        Objective:   Filed Vitals:   03/06/15 0212 03/06/15 0514 03/06/15 0600 03/06/15 1014  BP: 171/34  181/61 153/70  Pulse: 63 82  84  Temp: 98.1 F (36.7 C)  97.5 F (36.4 C)  98.8 F (37.1 C)  TempSrc: Oral Tympanic  Oral  Resp: 20 20  20   Height:      Weight:      SpO2:  100%  99%    Wt Readings from Last 3 Encounters:  03/05/15 61.961 kg (136 lb 9.6 oz)  02/20/15 55.067 kg (121 lb 6.4 oz)  02/17/15 58.423 kg (128 lb 12.8 oz)     Intake/Output Summary (Last 24 hours) at 03/06/15 1338 Last data filed at 03/06/15 1302  Gross per 24 hour  Intake  707.5 ml  Output   3100 ml  Net -2392.5 ml     Physical Exam  Awake , more alert than yesterday, No new F.N deficits, Normal affect [place].AT,PERRAL Supple Neck,No JVD, No cervical lymphadenopathy appriciated.  Symmetrical Chest wall movement, Good air movement bilaterally, CTAB RRR,No Gallops,Rubs or new Murmurs, No Parasternal Heave +ve B.Sounds, Abd Soft, No tenderness,  No organomegaly appriciated, No rebound - guarding or rigidity. No Cyanosis, Clubbing or edema, No new Rash or bruise      Data Review:   Micro Results No results found for this or any previous visit (from the past 240 hour(s)).  Radiology Reports Dg Chest 2 View  03/04/2015  CLINICAL DATA:  Altered mental status today. Bilateral upper extremity weakness. EXAM: CHEST  2 VIEW COMPARISON:  PA and lateral chest 01/21/2015 and 09/07/2012. CT chest 12/14/2011. FINDINGS: There is cardiomegaly and mild vascular congestion. No consolidative process, pneumothorax or effusion is identified. IMPRESSION: No acute disease. Cardiomegaly and vascular congestion. Electronically Signed   By: Inge Rise M.D.   On: 03/04/2015 10:25   Ct Head Wo Contrast  03/04/2015  CLINICAL DATA:  Altered mental status, weakness, difficulty with ambulation EXAM: CT HEAD WITHOUT CONTRAST TECHNIQUE: Contiguous axial images were obtained from the base of the skull through the vertex without intravenous contrast. COMPARISON:  08/10/2004 FINDINGS: There is no evidence of mass effect, midline shift, or extra-axial fluid collections. There is no evidence of a space-occupying lesion or intracranial hemorrhage. There is no evidence of a cortical-based area of acute infarction. There is generalized cerebral atrophy. There is periventricular white matter low attenuation likely secondary to microangiopathy. The ventricles and sulci are appropriate for the patient's age. The basal cisterns are patent. Visualized portions of the orbits are unremarkable. The visualized portions of the paranasal sinuses and mastoid air cells are unremarkable. Cerebrovascular atherosclerotic calcifications are noted. The osseous structures are unremarkable. IMPRESSION: 1. No acute intracranial pathology. 2. Chronic microvascular disease and cerebral atrophy. Electronically Signed   By: Kathreen Devoid   On: 03/04/2015 10:36     CBC  Recent Labs Lab  03/04/15 0929 03/04/15 0952 03/04/15 1510 03/05/15 0855 03/06/15 0550  WBC 8.5  --  7.6 8.9 8.8  HGB 10.3* 11.2* 9.6* 9.2* 9.5*  HCT 32.5* 33.0* 30.8*  28.9* 29.4* 29.9*  PLT 217  --  204 209 202  MCV 95.3  --  94.8 94.5 94.9  MCH 30.2  --  29.5 29.6 30.2  MCHC 31.7  --  31.2 31.3 31.8  RDW 15.0  --  14.8 14.9 14.7  LYMPHSABS 0.8  --   --   --   --   MONOABS 0.6  --   --   --   --   EOSABS 0.1  --   --   --   --   BASOSABS 0.1  --   --   --   --  Chemistries   Recent Labs Lab 03/04/15 0952 03/04/15 1510 03/05/15 0855 03/05/15 1625 03/06/15 0550  NA 139  --  142  --  143  K 4.2  --  4.1  --  3.6  CL 97*  --  100*  --  107  CO2  --   --  33*  --  29  GLUCOSE 137*  --  90  --  86  BUN 42*  --  48*  --  45*  CREATININE 1.90* 1.98* 2.58*  --  2.88*  CALCIUM  --   --  >15.0* 13.4* 12.3*  MG  --   --  1.5*  --  1.4*  AST  --   --  20  --  20  ALT  --   --  11*  --  11*  ALKPHOS  --   --  59  --  62  BILITOT  --   --  0.4  --  0.5   ------------------------------------------------------------------------------------------------------------------ No results for input(s): CHOL, HDL, LDLCALC, TRIG, CHOLHDL, LDLDIRECT in the last 72 hours.  Lab Results  Component Value Date   HGBA1C 6.2* 03/04/2015   ------------------------------------------------------------------------------------------------------------------  Recent Labs  03/04/15 1510  TSH 0.714   ------------------------------------------------------------------------------------------------------------------  Recent Labs  03/04/15 1510  VITAMINB12 564    Coagulation profile No results for input(s): INR, PROTIME in the last 168 hours.  No results for input(s): DDIMER in the last 72 hours.  Cardiac Enzymes No results for input(s): CKMB, TROPONINI, MYOGLOBIN in the last 168 hours.  Invalid input(s):  CK ------------------------------------------------------------------------------------------------------------------  Time Spent in minutes   35   Alila Sotero K M.D on 03/06/2015 at 1:38 PM  Between 7am to 7pm - Pager - [phone]  After 7pm go to [email or web address] - password [facility]  [facility] -  Office  [phone]

## 2015-03-06 NOTE — Evaluation (Signed)
Clinical/Bedside Swallow Evaluation Patient Details  Name: [name] MRN: [id number] Date of Birth: [birth year]  Today's Date: 03/06/2015 Time: SLP Start Time (ACUTE ONLY): [id number] SLP Stop Time (ACUTE ONLY): 1402 SLP Time Calculation (min) (ACUTE ONLY): 10 min  Past Medical History:  Past Medical History  Diagnosis Date  . Peripheral vascular disease ([place])     a.s /p left common iliac and external iliac artery stents. b. H/o subclavian, mesenteric, and celiac artery stenosis (see below).  . Diabetes mellitus   . COPD (chronic obstructive pulmonary disease) ([facility])     home oxygen  . Hyperlipidemia   . Depression   . Chronic diastolic CHF (congestive heart failure) ([place])   . Essential hypertension   . Renal artery stenosis ([place])     a. Duplex 2014: no obvious evidence of hemodynamically significant stenosis >60%, bilateral intrarenal arteries exhibit absent diastolic flow. There is evidence of elevated velocities of the mid aorta. 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  [name] CAD (coronary artery disease)   . Iron deficiency anemia   . Diabetic retinopathy ([place])   . 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 Carotid artery disease ([place])     a. Duplex 12/2014: left CEA patent with elevated velocities, 60-79% RICA (stable over serial exams), 123456 LICA stenosis (stable over serial exams), 50% LECA, elevated velocities in bilateral subclavian arteries.  . Hypertensive heart disease with congestive heart failure ([place])   . CKD (chronic kidney disease), stage III   . Tobacco abuse   . Bradycardia     a. Limiting BB titration.  . Anemia, chronic disease 05/16/2008    Qualifier: Diagnosis of  By: Percival Spanish, MD, Farrel Gordon    . Dementia   . Protein-calorie malnutrition ([place]) 01/24/2015   Past Surgical History:  Past Surgical History  Procedure Laterality  Date  . Carotid endarterectomy    . Cholecystectomy    . Appendectomy    . Tonsillectomy    . Cesarean section    . Shoulder arthroscopy    . Iliac artery stent      left  . Enteroscopy  01/26/2012    Procedure: ENTEROSCOPY;  Surgeon: Gatha Mayer, MD;  Location: [facility];  Service: Endoscopy;  Laterality: N/A;  . Cardiac catheterization N/A 01/23/2015    Procedure: Left Heart Cath and Coronary Angiography;  Surgeon: [name], MD;  Location: [facility];  Service: Cardiovascular;  Laterality: N/A;   HPI:  65 y.o. female with mild baseline dementia admitted with severe metabolic encephalopathy due to severe hypercalcemia with dehydration and acute renal failure.    Assessment / Plan / Recommendation Clinical Impression  Pt presents with functional swallow with slow, but adequate, mastication; brisk swallow response; no overt s/s of aspiration.  Pt with decreased initiation and sluggish response time, but appears to be protecting airway adequately.  No SLP f/u recommended.  Continue current diet.      Aspiration Risk  No limitations    Diet Recommendation     Medication Administration: Whole meds with puree    Other  Recommendations Oral Care Recommendations: Oral care BID   Follow up Recommendations  None            Swallow Study   General Date of Onset: 03/04/15 HPI: 65 y.o. female with mild baseline dementia admitted with severe metabolic encephalopathy due to  severe hypercalcemia with dehydration and acute renal failure.  Type of Study: Bedside Swallow Evaluation Previous Swallow Assessment: no Diet Prior to this Study: Regular;Thin liquids Temperature Spikes Noted: No Respiratory Status: Nasal cannula History of Recent Intubation: No Behavior/Cognition: Alert;Requires cueing Oral Cavity Assessment: Within Functional Limits Oral Care Completed by SLP: No Oral Cavity - Dentition: Missing dentition Vision: Functional for  self-feeding Self-Feeding Abilities: Needs assist Patient Positioning: Upright in chair Baseline Vocal Quality: Normal Volitional Cough: Weak Volitional Swallow: Able to elicit    Oral/Motor/Sensory Function Overall Oral Motor/Sensory Function: Within functional limits   Ice Chips Ice chips: Within functional limits Presentation: Spoon   Thin Liquid Thin Liquid: Within functional limits Presentation: Cup;Straw    Nectar Thick Nectar Thick Liquid: Not tested   Honey Thick Honey Thick Liquid: Not tested   Puree Puree: Within functional limits   Solid  [name], [place] CCC/SLP Pager [phone]    Solid: Within functional limits        [name] 03/06/2015,2:13 PM

## 2015-03-06 NOTE — Progress Notes (Signed)
[facility] Progress Note    Assessment/ Plan:   1. 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 PTHrP, SPEP, UPEP and SFLC already requested by Dr. Candiss Norse. Patient has mild dementia according to the daughter Lavella Lemons [phone]) but mental status certainly worsened over the past few days. Likely has either multiple myeloma vs lung CA. - Aggressively hydrate with isotonic fluids as you are doing. - Patient with a history of CHF and is on Lasix at home --> CXR also shows vascular congestion. - Given Calcitonin --> may monitor response - if she is responsive then may give again later in the day. - Also gave Zometa 40m IV x1 on 1/12; more of a delayed response but there is tachyphylaxis with the Calcitonin and less of a durable response. - Ca improving trend and her mental status has improved as well. 2. Renal failure - partly from volume status, may be intravascularly on the dry side based on history and exam. Need to monitor carefully with the aggressive hydration (given appropriately) with the h/o CHF. Also likely has a component of infiltrative disease with evidence of proteinuria on exam. - will check a renal ultrasound also --> sometimes infiltrative kidney diseases can lead to larger kidneys. Also r/o obstruction which is lower on the differential. - no acute indication for dialysis. - will also check a urine microscopy; I would like to see personally but I doubt there is ATN. - per I&O's everything we gave her was voided out; I would like to give her 5024mof isotonic fluid slowly and see if she tolerates. She may have been volume down bec of the osmotic diuresis at home. 3. COPD 4. DM 5. CASHD 6. RAS 7. Mild dementia  Subjective:   Husband bedside. She is on Lasix 8033maily at home; at one point MD instructed to take BID but they have never taken it BID. She is much more alert today and at baseline per husband. She denies any dysnpea and also per husband  her resp status is at baseline. She is on home O2 and only stopped smoking a month ago.   Objective:   BP 153/70 mmHg  Pulse 84  Temp(Src) 98.8 F (37.1 C) (Oral)  Resp 20  Ht 4' 10"  (1.473 m)  Wt 61.961 kg (136 lb 9.6 oz)  BMI 28.56 kg/m2  SpO2 99%  Intake/Output Summary (Last 24 hours) at 03/06/15 1332 Last data filed at 03/06/15 1302  Gross per 24 hour  Intake  707.5 ml  Output   3100 ml  Net -2392.5 ml   Weight change:   Physical Exam: General appearance: cooperative and confused Head: Normocephalic, without obvious abnormality, atraumatic Eyes: negative Throat: no exudates Neck: no adenopathy, no carotid bruit, supple, symmetrical, trachea midline and thyroid not enlarged, symmetric, no tenderness/mass/nodules Back: symmetric, no curvature. ROM normal. No CVA tenderness. Resp: poor air movement Chest wall: no tenderness Cardio: regular rate and rhythm, S1, S2 normal, no murmur, click, rub or gallop GI: soft, non-tender; bowel sounds normal; no masses, no organomegaly Extremities: edema none Pulses: 2+ and symmetric Skin: Skin color, texture, turgor normal. No rashes or lesions Lymph nodes: Cervical, supraclavicular, and axillary nodes normal. Neurologic: Mental status: disoriented  Imaging: No results found.  Labs: BMET  Recent Labs Lab 03/04/15 0952 03/04/15 1510 03/05/15 0855 03/05/15 1625 03/06/15 0550  NA 139  --  142  --  143  K 4.2  --  4.1  --  3.6  CL 97*  --  100*  --  107  CO2  --   --  33*  --  29  GLUCOSE 137*  --  90  --  86  BUN 42*  --  48*  --  45*  CREATININE 1.90* 1.98* 2.58*  --  2.88*  CALCIUM  --   --  >15.0* 13.4* 12.3*   CBC  Recent Labs Lab 03/04/15 0929 03/04/15 0952 03/04/15 1510 03/05/15 0855 03/06/15 0550  WBC 8.5  --  7.6 8.9 8.8  NEUTROABS 7.0  --   --   --   --   HGB 10.3* 11.2* 9.6* 9.2* 9.5*  HCT 32.5* 33.0* 30.8*  28.9* 29.4* 29.9*  MCV 95.3  --  94.8 94.5 94.9  PLT 217  --  204 209 202     Medications:    . amLODipine  10 mg Oral Daily  . aspirin EC  81 mg Oral Daily  . atorvastatin  80 mg Oral q1800  . carvedilol  3.125 mg Oral BID WC  . clopidogrel  75 mg Oral Daily  . enoxaparin (LOVENOX) injection  30 mg Subcutaneous Q24H  . ferrous sulfate  325 mg Oral TID  . hydrALAZINE  50 mg Oral 3 times per day  . insulin aspart  0-5 Units Subcutaneous QHS  . insulin aspart  0-9 Units Subcutaneous TID WC  . isosorbide mononitrate  30 mg Oral Daily  . mometasone-formoterol  1 puff Inhalation Daily  . sodium chloride  3 mL Intravenous Q12H      Otelia Santee, MD 03/06/2015, 1:32 PM

## 2015-03-06 NOTE — Progress Notes (Signed)
Occupational Therapy Evaluation Patient Details Name: [name] MRN: [id number] DOB: 10/07/1950 Today's Date: 03/06/2015    History of Present Illness 65 y.o. female past medical history that includes type 2 diabetes, hypertension, COPD, diastolic heart failure, on home oxygen, chronic kidney disease stage III, dementia. Presetns with generalized weakness and AMS. Pt with encephalopathy.   Clinical Impression   Pt unable to give PLOF due to cognitive deficits. From chart, it appears pt ambulated at baseline and required Mod A for ADL from husband. Pt currently requires +2 total A for stand pivot transfers and total A for ADL. Recommend SNF for rehab. Will follow pt acutely to address established goals and maximize functional level of independence.     Follow Up Recommendations  SNF;Supervision/Assistance - 24 hour    Equipment Recommendations  Other (comment) (TBA at SNF)    Recommendations for Other Services       Precautions / Restrictions Precautions Precautions: Fall      Mobility Bed Mobility Overal bed mobility: +2 for physical assistance;Needs Assistance Bed Mobility: Sit to Supine       Sit to supine: Total assist;+2 for physical assistance      Transfers Overall transfer level: Needs assistance   Transfers: Sit to/from Stand;Stand Pivot Transfers Sit to Stand: Max assist;+2 physical assistance Stand pivot transfers: Total assist;+2 physical assistance       General transfer comment: Pt was unable ot initiate scoot to edge of chair. Unable to innitiate any movement to stand. Pt helped minimally to power up but required total A tto pivot to chair. Not moving feet.    Balance Overall balance assessment: Needs assistance Sitting-balance support: Feet supported Sitting balance-Leahy Scale: Poor Sitting balance - Comments: R lat lean Postural control: Right lateral lean;Posterior lean   Standing balance-Leahy Scale: Zero                               ADL Overall ADL's : Needs assistance/impaired                                     Functional mobility during ADLs: +2 for physical assistance;Total assistance General ADL Comments: total A at this time.      Vision Additional Comments: unable to assess   Perception     Praxis Praxis Praxis tested?: Deficits Deficits: Initiation;Motor Impersistence    Pertinent Vitals/Pain Pain Assessment: Faces Faces Pain Scale: No hurt     Hand Dominance     Extremity/Trunk Assessment Upper Extremity Assessment Upper Extremity Assessment: Generalized weakness (stiff/rigid movement. moving BUE actively )   Lower Extremity Assessment Lower Extremity Assessment: Generalized weakness   Cervical / Trunk Assessment Cervical / Trunk Assessment: Kyphotic   Communication Communication Communication: Expressive difficulties   Cognition Arousal/Alertness: Lethargic Behavior During Therapy: Flat affect Overall Cognitive Status: No family/caregiver present to determine baseline cognitive functioning                     General Comments       Exercises       Shoulder Instructions      Home Living Family/patient expects to be discharged to:: Skilled nursing facility  Prior Functioning/Environment Level of Independence: Needs assistance  Gait / Transfers Assistance Needed: ? independent with ambulation ADL's / Homemaking Assistance Needed: mod A with ADL from husband   Comments: pt unable to give PLOF    OT Diagnosis: Generalized weakness;Cognitive deficits;Altered mental status   OT Problem List: Decreased strength;Decreased range of motion;Decreased activity tolerance;Impaired balance (sitting and/or standing);Decreased coordination;Decreased cognition;Decreased safety awareness;Decreased knowledge of use of DME or AE;Impaired UE functional use   OT Treatment/Interventions: Self-care/ADL  training;Therapeutic exercise;DME and/or AE instruction;Therapeutic activities;Cognitive remediation/compensation;Patient/family education;Balance training    OT Goals(Current goals can be found in the care plan section) Acute Rehab OT Goals Patient Stated Goal: unable to state OT Goal Formulation: Patient unable to participate in goal setting Time For Goal Achievement: 03/20/15 Potential to Achieve Goals: Fair ADL Goals Pt Will Perform Eating: with set-up;sitting Pt Will Perform Grooming: with supervision;sitting Pt Will Perform Upper Body Bathing: with min assist;sitting Pt Will Transfer to Toilet: with mod assist;bedside commode;stand pivot transfer  OT Frequency: Min 2X/week   Barriers to D/C:            Co-evaluation              End of Session Equipment Utilized During Treatment: Gait belt Nurse Communication: Mobility status;Need for lift equipment (stedy +2 with pad and gaitbelt)  Activity Tolerance: Patient tolerated treatment well Patient left: in bed;with call bell/phone within reach;with bed alarm set;with SCD's reapplied   Time: 1705-1730 OT Time Calculation (min): 25 min Charges:  OT General Charges $OT Visit: 1 Procedure OT Evaluation $OT Eval Moderate Complexity: 1 Procedure OT Treatments $Self Care/Home Management : 8-22 mins G-Codes:    [name],[name] 03-Apr-2015, 5:58 PM   [facility], OTR/L  [phone] 2015-04-03

## 2015-03-07 ENCOUNTER — Inpatient Hospital Stay (HOSPITAL_COMMUNITY): Payer: Medicaid Other

## 2015-03-07 LAB — MAGNESIUM: Magnesium: 2 mg/dL (ref 1.7–2.4)

## 2015-03-07 LAB — COMPREHENSIVE METABOLIC PANEL
ALK PHOS: 55 U/L (ref 38–126)
ALT: 8 U/L — AB (ref 14–54)
AST: 21 U/L (ref 15–41)
Albumin: 2.2 g/dL — ABNORMAL LOW (ref 3.5–5.0)
Anion gap: 7 (ref 5–15)
BUN: 46 mg/dL — ABNORMAL HIGH (ref 6–20)
CALCIUM: 10.4 mg/dL — AB (ref 8.9–10.3)
CO2: 24 mmol/L (ref 22–32)
CREATININE: 2.89 mg/dL — AB (ref 0.44–1.00)
Chloride: 111 mmol/L (ref 101–111)
GFR calc non Af Amer: 16 mL/min — ABNORMAL LOW (ref >60)
GFR, EST AFRICAN AMERICAN: 19 mL/min — AB (ref >60)
Glucose, Bld: 98 mg/dL (ref 65–99)
Potassium: 3.4 mmol/L — ABNORMAL LOW (ref 3.5–5.1)
SODIUM: 142 mmol/L (ref 135–145)
Total Bilirubin: 0.6 mg/dL (ref 0.3–1.2)
Total Protein: 4.9 g/dL — ABNORMAL LOW (ref 6.5–8.1)

## 2015-03-07 LAB — GLUCOSE, CAPILLARY
GLUCOSE-CAPILLARY: 106 mg/dL — AB (ref 65–99)
Glucose-Capillary: 129 mg/dL — ABNORMAL HIGH (ref 65–99)
Glucose-Capillary: 136 mg/dL — ABNORMAL HIGH (ref 65–99)
Glucose-Capillary: 96 mg/dL (ref 65–99)

## 2015-03-07 LAB — VITAMIN D 25 HYDROXY (VIT D DEFICIENCY, FRACTURES): VIT D 25 HYDROXY: 9.5 ng/mL — AB (ref 30.0–100.0)

## 2015-03-07 NOTE — Evaluation (Signed)
Physical Therapy Evaluation Patient Details Name: [name] MRN: [id number] DOB: [birth year] Today's Date: 03/07/2015   History of Present Illness  65 y.o. female past medical history that includes type 2 diabetes, hypertension, COPD, diastolic heart failure, on home oxygen, chronic kidney disease stage III, dementia. Presetns with generalized weakness and AMS. Pt with encephalopathy.    Clinical Impression  Pt admitted with above diagnosis. No family present to discuss home set-up, situation, or ability to provide necessary assist at home. Pt currently with functional limitations due to the deficits listed below (see PT Problem List).  Pt will benefit from skilled PT to increase their independence and safety with mobility to allow discharge to the venue listed below.       Follow Up Recommendations SNF;Supervision/Assistance - 24 hour  (although noted ?Medicaid pending and would not qualify for therapy at SNF; consider CIR if family can provide care on discharge)    Equipment Recommendations  Other (comment) (TBA)    Recommendations for Other Services       Precautions / Restrictions Precautions Precautions: Fall      Mobility  Bed Mobility Overal bed mobility: +2 for physical assistance;Needs Assistance Bed Mobility: Rolling;Sidelying to Sit Rolling: Min assist Sidelying to sit: Max assist       General bed mobility comments: moves slowly to instructions  Transfers Overall transfer level: Needs assistance Equipment used: Rolling walker (2 wheeled) Transfers: Sit to/from Stand Sit to Stand: +2 physical assistance;Min assist;+2 safety/equipment         General transfer comment: pt grasps walker with bil hands with PT to anchor RW; posterior lean  Ambulation/Gait Ambulation/Gait assistance: Mod assist;+2 safety/equipment Ambulation Distance (Feet): 6 Feet Assistive device: Rolling walker (2 wheeled) Gait Pattern/deviations: Step-through pattern;Decreased stride  length;Decreased weight shift to right;Decreased weight shift to left;Leaning posteriorly;Shuffle;Trunk flexed Gait velocity: very slow Gait velocity interpretation: Below normal speed for age/gender General Gait Details: required full assist initially for weight shift and advancing LEs (?decr understanding what to do), after two total assist steps, she began walking much more smoothly  Stairs            Wheelchair Mobility    Modified Rankin (Stroke Patients Only)       Balance Overall balance assessment: Needs assistance Sitting-balance support: Bilateral upper extremity supported;Feet supported Sitting balance-Leahy Scale: Poor     Standing balance support: Bilateral upper extremity supported Standing balance-Leahy Scale: Poor                               Pertinent Vitals/Pain Pain Assessment: No/denies pain    Home Living Family/patient expects to be discharged to:: Skilled nursing facility                 Additional Comments: uses home O2 per chart    Prior Function Level of Independence: Needs assistance   Gait / Transfers Assistance Needed: per pt (?reliable) uses RW modified independent  ADL's / Homemaking Assistance Needed: mod A with ADL from husband  Comments: no family present     Hand Dominance   Dominant Hand: Left    Extremity/Trunk Assessment   Upper Extremity Assessment: Defer to OT evaluation           Lower Extremity Assessment: Generalized weakness;RLE deficits/detail;LLE deficits/detail RLE Deficits / Details: +clonus with pressure to ball of foot; stiffness but able to achieve [facility]; strength difficult to assess due to cognition and bradykinsesia LLE Deficits /  Details: +clonus with pressure to ball of foot; stiffness but able to achieve AAROM [facility] except DF -10; strength difficult to assess due to cognition and bradykinsesia  Cervical / Trunk Assessment: Kyphotic  Communication   Communication: Expressive  difficulties (delayed answers; )  Cognition Arousal/Alertness: Awake/alert Behavior During Therapy: Flat affect Overall Cognitive Status: No family/caregiver present to determine baseline cognitive functioning       Memory: Decreased short-term memory (documented h/o dementia)              General Comments      Exercises Other Exercises Other Exercises: AAROM bil LEs prior to attempting mobility      Assessment/Plan    PT Assessment Patient needs continued PT services  PT Diagnosis Difficulty walking;Generalized weakness;Altered mental status   PT Problem List Decreased strength;Decreased range of motion;Decreased balance;Decreased mobility;Decreased cognition;Decreased coordination;Decreased knowledge of use of DME;Decreased knowledge of precautions;Impaired tone  PT Treatment Interventions DME instruction;Gait training;Stair training;Functional mobility training;Therapeutic activities;Therapeutic exercise;Balance training;Neuromuscular re-education;Cognitive remediation;Patient/family education   PT Goals (Current goals can be found in the Care Plan section) Acute Rehab PT Goals Patient Stated Goal: unable to state PT Goal Formulation: Patient unable to participate in goal setting Time For Goal Achievement: 03/21/15 Potential to Achieve Goals: Fair    Frequency Min 3X/week   Barriers to discharge        Co-evaluation               End of Session Equipment Utilized During Treatment: Gait belt;Oxygen Activity Tolerance: Patient tolerated treatment well Patient left: in chair;with call bell/phone within reach;with chair alarm set Nurse Communication: Mobility status         Time: [id number] PT Time Calculation (min) (ACUTE ONLY): 18 min   Charges:   PT Evaluation $PT Eval Moderate Complexity: 1 Procedure     PT G Codes:        Kymber Kosar 13-Mar-2015, 10:22 AM Pager [phone]

## 2015-03-07 NOTE — Progress Notes (Signed)
PT Cancellation Note  Patient Details Name: [name] MRN: [id number] DOB: 10-17-1950   Cancelled Treatment:    Reason Eval/Treat Not Completed: Patient at procedure or test/unavailable. Nursing working on feeding pt. Will return   Kielee Care 03/07/2015, 8:37 AM  Pager [phone]

## 2015-03-07 NOTE — Progress Notes (Signed)
[facility] Progress Note    Assessment/ Plan:   1. 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 PTHrP, SPEP, UPEP and SFLC already requested by Dr. Candiss Norse. Patient has mild dementia according to the daughter Lavella Lemons [phone]) but mental status certainly worsened over the past few days. Likely has either multiple myeloma vs lung CA. She was taking a lot of TUMS at home but usually such an acute rise in Ca (normal in 12/2014) is suggestive of a malignancy. - Let's hold the fluids for today and see if she equilibrates --> Cr may be peaking now after being positive yesterday (Dr. Candiss Norse stopped the Lasix yesterday after only the AM dose). Her resp status is no worse than baseline. - Given Calcitonin and  Zometa 5m IV x1 on 1/12; more of a delayed response but there is tachyphylaxis with the Calcitonin and less of a durable response. - Ca improving trend and her mental status has improved as well. 2. Renal failure - partly from volume status, may be intravascularly on the dry side based on history and exam. Need to monitor carefully with the aggressive hydration (given appropriately) with the h/o CHF. Also likely has a component of infiltrative disease with evidence of proteinuria on exam vs diabetic nephropathy. - will check a renal ultrasound also. - no acute indication for dialysis. - will also check a urine microscopy --> very bland urine; I would like to see personally but I doubt there is ATN.  3. COPD 4. DM 5. CASHD 6. RAS 7. Mild dementia  Subjective:   Husband and duaghter bedside. She is on Lasix 856mdaily at home; at one point MD instructed to take BID but they have never taken it BID.  She is much more alert today and at baseline per husband. She denies any dysnpea and also per husband her resp status is at baseline as well. She is on home O2 and only stopped smoking a month ago.          Objective:   BP 157/45 mmHg  Pulse 74  Temp(Src)  98.3 F (36.8 C) (Oral)  Resp 16  Ht 4' 10"  (1.473 m)  Wt 63.504 kg (140 lb)  BMI 29.27 kg/m2  SpO2 97%  Intake/Output Summary (Last 24 hours) at 03/07/15 1034 Last data filed at 03/07/15 0700  Gross per 24 hour  Intake 2517.5 ml  Output   1575 ml  Net  942.5 ml   Weight change:   Physical Exam: General appearance: cooperative  Head: Normocephalic, without obvious abnormality, atraumatic Resp: better air movement with no rales at the bases Chest wall: no tenderness Cardio: regular rate and rhythm, S1, S2 normal, no murmur, click, rub or gallop GI: soft, non-tender; bowel sounds normal; no masses, no organomegaly Extremities: edema none Pulses: 2+ and symmetric Neurologic: Mental status: improved, pleasant and cooperative  Imaging: No results found.  Labs: BMET  Recent Labs Lab 03/04/15 0952 03/04/15 1510 03/05/15 0855 03/05/15 1625 03/06/15 0550 03/07/15 0635  NA 139  --  142  --  143 142  K 4.2  --  4.1  --  3.6 3.4*  CL 97*  --  100*  --  107 111  CO2  --   --  33*  --  29 24  GLUCOSE 137*  --  90  --  86 98  BUN 42*  --  48*  --  45* 46*  CREATININE 1.90* 1.98* 2.58*  --  2.88* 2.89*  CALCIUM  --   --  >15.0* 13.4* 12.3* 10.4*   CBC  Recent Labs Lab 03/04/15 0929 03/04/15 0952 03/04/15 1510 03/05/15 0855 03/06/15 0550  WBC 8.5  --  7.6 8.9 8.8  NEUTROABS 7.0  --   --   --   --   HGB 10.3* 11.2* 9.6* 9.2* 9.5*  HCT 32.5* 33.0* 30.8*  28.9* 29.4* 29.9*  MCV 95.3  --  94.8 94.5 94.9  PLT 217  --  204 209 202    Medications:    . amLODipine  10 mg Oral Daily  . aspirin EC  81 mg Oral Daily  . atorvastatin  80 mg Oral q1800  . carvedilol  3.125 mg Oral BID WC  . clopidogrel  75 mg Oral Daily  . enoxaparin (LOVENOX) injection  30 mg Subcutaneous Q24H  . ferrous sulfate  325 mg Oral TID  . hydrALAZINE  50 mg Oral 3 times per day  . insulin aspart  0-5 Units Subcutaneous QHS  . insulin aspart  0-9 Units Subcutaneous TID WC  . isosorbide  mononitrate  30 mg Oral Daily  . mometasone-formoterol  1 puff Inhalation Daily  . sodium chloride  3 mL Intravenous Q12H      Otelia Santee, MD 03/07/2015, 10:34 AM

## 2015-03-07 NOTE — Progress Notes (Signed)
Patient Demographics:    [name], is a 65 y.o. female, DOB - 1950/04/25, [id number]  Admit date - 03/04/2015   Admitting Physician Waldemar Dickens, MD  Outpatient Primary MD for the patient is [name], MD  LOS - 2   Chief Complaint  Patient presents with  . Altered Mental Status  . Weakness        Subjective:    [name] today has, No headache, No chest pain, No abdominal pain - No Nausea, No new weakness tingling or numbness, No Cough - SOB. Improved.   Assessment  & Plan :     1. Severe metabolic encephalopathy due to severe hypercalcemia with dehydration and acute renal failure. Post IVF and Lasix, Calcium has improved, someone calcitonin, Palmidronate. Renal consulted. Stable PTH, SPEP, ACE level, Vit D 25 levels, pending Vit D 1-25, UPEP, PTH rp Monitor CMP closely.   2. Essential hypertension. Continue on Norvasc, Imdur, lowered Coreg dose, hold clonidine as she was bradycardic.   3. ARF and CK D stage III. Plan as in #1 above. Outpt Renal follow up.   4. UPT on home oxygen, at baseline, continue supportive care.   5. Chronic diastolic dysfunction EF [id number] on rest and call. Currently mechanically compensated. Fluid and Lasix due to #1 above.   6. Mild asymptomatic bradycardia upon admission. Drop Coreg dose, discontinue clonidine. Monitor.    7. DM type II. Check A1c place on sliding scale.  Lab Results  Component Value Date   HGBA1C 6.2* 03/04/2015    CBG (last 3)   Recent Labs  03/06/15 1734 03/06/15 2111 03/07/15 0655  GLUCAP 83 83 106*        Code Status : Full  Family Communication  : Family  Disposition Plan  : To be decided   Consults  : Nephrology  Procedures  : CT head. Nonacute  DVT Prophylaxis  :  Lovenox    Lab Results   Component Value Date   PLT 202 03/06/2015    Inpatient Medications  Scheduled Meds: . amLODipine  10 mg Oral Daily  . aspirin EC  81 mg Oral Daily  . atorvastatin  80 mg Oral q1800  . carvedilol  3.125 mg Oral BID WC  . clopidogrel  75 mg Oral Daily  . enoxaparin (LOVENOX) injection  30 mg Subcutaneous Q24H  . ferrous sulfate  325 mg Oral TID  . hydrALAZINE  50 mg Oral 3 times per day  . insulin aspart  0-5 Units Subcutaneous QHS  . insulin aspart  0-9 Units Subcutaneous TID WC  . isosorbide mononitrate  30 mg Oral Daily  . mometasone-formoterol  1 puff Inhalation Daily   Continuous Infusions:   PRN Meds:.acetaminophen **OR** [DISCONTINUED] acetaminophen, albuterol, hydrALAZINE, [DISCONTINUED] ondansetron **OR** ondansetron (ZOFRAN) IV  Antibiotics  :    Anti-infectives    None        Objective:   Filed Vitals:   03/07/15 0115 03/07/15 0500 03/07/15 0523 03/07/15 0817  BP: 142/36  157/45   Pulse: 81  74   Temp: 97.6 F (36.4 C)  98.3 F (36.8 C)   TempSrc: Oral  Oral   Resp: 18  16   Height:      Weight:  63.504 kg (140 lb)    SpO2: 97%  97% 97%    Wt Readings from Last 3 Encounters:  03/07/15 63.504 kg (140 lb)  02/20/15 55.067 kg (121 lb 6.4 oz)  02/17/15 58.423 kg (128 lb 12.8 oz)     Intake/Output Summary (Last 24 hours) at 03/07/15 1041 Last data filed at 03/07/15 0700  Gross per 24 hour  Intake 2517.5 ml  Output   1575 ml  Net  942.5 ml     Physical Exam  Awake , more alert than yesterday, No new F.N deficits, Normal affect [place].AT,PERRAL Supple Neck,No JVD, No cervical lymphadenopathy appriciated.  Symmetrical Chest wall movement, Good air movement bilaterally, CTAB RRR,No Gallops,Rubs or new Murmurs, No Parasternal Heave +ve B.Sounds, Abd Soft, No tenderness, No organomegaly appriciated, No rebound - guarding or rigidity. No Cyanosis, Clubbing or edema, No new Rash or bruise      Data Review:   Micro Results No results found for  this or any previous visit (from the past 240 hour(s)).  Radiology Reports Dg Chest 2 View  03/04/2015  CLINICAL DATA:  Altered mental status today. Bilateral upper extremity weakness. EXAM: CHEST  2 VIEW COMPARISON:  PA and lateral chest 01/21/2015 and 09/07/2012. CT chest 12/14/2011. FINDINGS: There is cardiomegaly and mild vascular congestion. No consolidative process, pneumothorax or effusion is identified. IMPRESSION: No acute disease. Cardiomegaly and vascular congestion. Electronically Signed   By: Inge Rise M.D.   On: 03/04/2015 10:25   Ct Head Wo Contrast  03/04/2015  CLINICAL DATA:  Altered mental status, weakness, difficulty with ambulation EXAM: CT HEAD WITHOUT CONTRAST TECHNIQUE: Contiguous axial images were obtained from the base of the skull through the vertex without intravenous contrast. COMPARISON:  08/10/2004 FINDINGS: There is no evidence of mass effect, midline shift, or extra-axial fluid collections. There is no evidence of a space-occupying lesion or intracranial hemorrhage. There is no evidence of a cortical-based area of acute infarction. There is generalized cerebral atrophy. There is periventricular white matter low attenuation likely secondary to microangiopathy. The ventricles and sulci are appropriate for the patient's age. The basal cisterns are patent. Visualized portions of the orbits are unremarkable. The visualized portions of the paranasal sinuses and mastoid air cells are unremarkable. Cerebrovascular atherosclerotic calcifications are noted. The osseous structures are unremarkable. IMPRESSION: 1. No acute intracranial pathology. 2. Chronic microvascular disease and cerebral atrophy. Electronically Signed   By: Kathreen Devoid   On: 03/04/2015 10:36     CBC  Recent Labs Lab 03/04/15 0929 03/04/15 0952 03/04/15 1510 03/05/15 0855 03/06/15 0550  WBC 8.5  --  7.6 8.9 8.8  HGB 10.3* 11.2* 9.6* 9.2* 9.5*  HCT 32.5* 33.0* 30.8*  28.9* 29.4* 29.9*  PLT 217  --   204 209 202  MCV 95.3  --  94.8 94.5 94.9  MCH 30.2  --  29.5 29.6 30.2  MCHC 31.7  --  31.2 31.3 31.8  RDW 15.0  --  14.8 14.9 14.7  LYMPHSABS 0.8  --   --   --   --   MONOABS 0.6  --   --   --   --   EOSABS 0.1  --   --   --   --   BASOSABS 0.1  --   --   --   --     Chemistries   Recent Labs Lab 03/04/15 0952 03/04/15 1510 03/05/15 0855 03/05/15 1625 03/06/15 0550 03/07/15 0635  NA 139  --  142  --  143 142  K 4.2  --  4.1  --  3.6 3.4*  CL 97*  --  100*  --  107 111  CO2  --   --  33*  --  29 24  GLUCOSE 137*  --  90  --  86 98  BUN 42*  --  48*  --  45* 46*  CREATININE 1.90* 1.98* 2.58*  --  2.88* 2.89*  CALCIUM  --   --  >15.0* 13.4* 12.3* 10.4*  MG  --   --  1.5*  --  1.4* 2.0  AST  --   --  20  --  20 21  ALT  --   --  11*  --  11* 8*  ALKPHOS  --   --  59  --  62 55  BILITOT  --   --  0.4  --  0.5 0.6   ------------------------------------------------------------------------------------------------------------------ No results for input(s): CHOL, HDL, LDLCALC, TRIG, CHOLHDL, LDLDIRECT in the last 72 hours.  Lab Results  Component Value Date   HGBA1C 6.2* 03/04/2015   ------------------------------------------------------------------------------------------------------------------  Recent Labs  03/04/15 1510  TSH 0.714   ------------------------------------------------------------------------------------------------------------------  Recent Labs  03/04/15 1510  VITAMINB12 564    Coagulation profile No results for input(s): INR, PROTIME in the last 168 hours.  No results for input(s): DDIMER in the last 72 hours.  Cardiac Enzymes No results for input(s): CKMB, TROPONINI, MYOGLOBIN in the last 168 hours.  Invalid input(s): CK ------------------------------------------------------------------------------------------------------------------  Time Spent in minutes   35   SINGH,PRASHANT K M.D on 03/07/2015 at 10:41 AM  Between 7am to 7pm -  Pager - [phone]  After 7pm go to [email or web address] - password [facility]  [facility] -  Office  [phone]

## 2015-03-08 ENCOUNTER — Inpatient Hospital Stay (HOSPITAL_COMMUNITY): Payer: Medicaid Other

## 2015-03-08 LAB — GLUCOSE, CAPILLARY
GLUCOSE-CAPILLARY: 88 mg/dL (ref 65–99)
GLUCOSE-CAPILLARY: 168 mg/dL — AB (ref 65–99)
Glucose-Capillary: 213 mg/dL — ABNORMAL HIGH (ref 65–99)
Glucose-Capillary: 170 mg/dL — ABNORMAL HIGH (ref 65–99)

## 2015-03-08 LAB — COMPREHENSIVE METABOLIC PANEL
ALBUMIN: 2.3 g/dL — AB (ref 3.5–5.0)
ALK PHOS: 59 U/L (ref 38–126)
ALT: 9 U/L — ABNORMAL LOW (ref 14–54)
ANION GAP: 12 (ref 5–15)
AST: 18 U/L (ref 15–41)
BUN: 42 mg/dL — AB (ref 6–20)
CALCIUM: 10.4 mg/dL — AB (ref 8.9–10.3)
CHLORIDE: 108 mmol/L (ref 101–111)
CO2: 23 mmol/L (ref 22–32)
CREATININE: 3.09 mg/dL — AB (ref 0.44–1.00)
GFR calc Af Amer: 17 mL/min — ABNORMAL LOW (ref >60)
GFR calc non Af Amer: 15 mL/min — ABNORMAL LOW (ref >60)
GLUCOSE: 84 mg/dL (ref 65–99)
Potassium: 3.3 mmol/L — ABNORMAL LOW (ref 3.5–5.1)
SODIUM: 143 mmol/L (ref 135–145)
Total Bilirubin: 0.5 mg/dL (ref 0.3–1.2)
Total Protein: 5.4 g/dL — ABNORMAL LOW (ref 6.5–8.1)

## 2015-03-08 LAB — URINALYSIS, ROUTINE W REFLEX MICROSCOPIC
Bilirubin Urine: NEGATIVE
GLUCOSE, UA: 100 mg/dL — AB
Ketones, ur: NEGATIVE mg/dL
Nitrite: NEGATIVE
PH: 7 (ref 5.0–8.0)
Specific Gravity, Urine: 1.011 (ref 1.005–1.030)

## 2015-03-08 LAB — URINE MICROSCOPIC-ADD ON: SQUAMOUS EPITHELIAL / LPF: NONE SEEN

## 2015-03-08 LAB — CBC
HCT: 28.6 % — ABNORMAL LOW (ref 36.0–46.0)
Hemoglobin: 9.3 g/dL — ABNORMAL LOW (ref 12.0–15.0)
MCH: 30 pg (ref 26.0–34.0)
MCHC: 32.5 g/dL (ref 30.0–36.0)
MCV: 92.3 fL (ref 78.0–100.0)
PLATELETS: 212 10*3/uL (ref 150–400)
RBC: 3.1 MIL/uL — AB (ref 3.87–5.11)
RDW: 14.2 % (ref 11.5–15.5)
WBC: 13.9 10*3/uL — ABNORMAL HIGH (ref 4.0–10.5)

## 2015-03-08 LAB — POTASSIUM: POTASSIUM: 3.7 mmol/L (ref 3.5–5.1)

## 2015-03-08 MED ORDER — CARVEDILOL 6.25 MG PO TABS
6.2500 mg | ORAL_TABLET | Freq: Two times a day (BID) | ORAL | Status: DC
  Administered 2015-03-08 – 2015-03-09 (×2): 6.25 mg via ORAL
  Filled 2015-03-08 (×2): qty 1

## 2015-03-08 MED ORDER — ENOXAPARIN SODIUM 30 MG/0.3ML ~~LOC~~ SOLN
30.0000 mg | SUBCUTANEOUS | Status: DC
  Administered 2015-03-09 – 2015-03-12 (×4): 30 mg via SUBCUTANEOUS
  Filled 2015-03-08 (×4): qty 0.3

## 2015-03-08 MED ORDER — POTASSIUM CHLORIDE CRYS ER 20 MEQ PO TBCR
40.0000 meq | EXTENDED_RELEASE_TABLET | Freq: Once | ORAL | Status: AC
  Administered 2015-03-08: 40 meq via ORAL
  Filled 2015-03-08: qty 2

## 2015-03-08 MED ORDER — FUROSEMIDE 10 MG/ML IJ SOLN
40.0000 mg | Freq: Once | INTRAMUSCULAR | Status: AC
  Administered 2015-03-08: 40 mg via INTRAVENOUS
  Filled 2015-03-08: qty 4

## 2015-03-08 NOTE — Progress Notes (Signed)
Rehab Admissions Coordinator Note:  Patient was screened by Retta Diones for appropriateness for an Inpatient Acute Rehab Consult.  At this time, we are recommending Inpatient Rehab consult.  Retta Diones 03/08/2015, 4:45 PM  I can be reached at [phone].

## 2015-03-08 NOTE — Progress Notes (Signed)
[facility] Progress Note    Assessment/ Plan:   1. 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 PTHrP, SPEP, UPEP and SFLC already requested by Dr. Candiss Norse. Patient has mild dementia according to the daughter Lavella Lemons [phone]) but mental status certainly worsened over the past few days. Likely has either multiple myeloma vs lung CA. She was taking a lot of TUMS at home but usually such an acute rise in Ca (normal in 12/2014) is suggestive of a malignancy. - Let's hold the fluids for today and see if she equilibrates --> Cr may be peaking now after being positive yesterday (Dr. Candiss Norse stopped the Lasix yesterday after only the AM dose). Her resp status is no worse than baseline. - Given Calcitonin and Zometa 1m IV x1 on 1/12; more of a delayed response but there is tachyphylaxis with the Calcitonin and less of a durable response. - Ca improving trend and her mental status has improved as well. 2. Renal failure - partly from volume status, may be intravascularly on the dry side based on history and exam. Need to monitor carefully with the aggressive hydration (given appropriately) with the h/o CHF. Also likely has a component of infiltrative disease with evidence of proteinuria on exam vs diabetic nephropathy. - renal ultrasound --> no e/o retention. - no acute indication for dialysis but renal function is worsening --> wonder if there is a component of dCHF (01/24/15 Echo 65% EF). - Will try a single dose of Lasix today. - will also check a urine microscopy --> very bland urine reviewed personally. No evidence of ATN.  3. COPD 4. DM 5. CASHD 6. RAS 7. Mild dementia  Subjective:   She is on Lasix 817mdaily at home; at one point MD instructed to take BID but they have never taken it BID.  She is much more alert today. She denies any dysnpea/nausea/abd pain/dizziness. She is on home O2 and only stopped smoking a month ago. No significant change from  yesterday.  Foley out   Objective:   BP 156/54 mmHg  Pulse 100  Temp(Src) 98.6 F (37 C) (Oral)  Resp 18  Ht _0  (1.473 m)  Wt 63.504 kg (140 lb)  BMI 29.27 kg/m2  SpO2 91%  Intake/Output Summary (Last 24 hours) at 03/08/15 1030 Last data filed at 03/08/15 0859  Gross per 24 hour  Intake    122 ml  Output      0 ml  Net    122 ml   Weight change:   Physical Exam: General appearance: cooperative  Head: Normocephalic, without obvious abnormality, atraumatic Resp: better air movement with no rales at the bases Chest wall: no tenderness Cardio: regular rate and rhythm, S1, S2 normal, no murmur, click, rub or gallop GI: soft, non-tender; bowel sounds normal; no masses, no organomegaly Extremities: edema none Pulses: 2+ and symmetric Neurologic: Mental status: improved, pleasant and cooperative GU: Foley out  Imaging: [place]  03/07/2015  CLINICAL DATA:  Acute kidney failure.  Diabetes and hypertension. EXAM: RENAL / URINARY TRACT ULTRASOUND COMPLETE COMPARISON:  09/08/2012 FINDINGS: Right Kidney: Length: 11.1 cm. Mildly increased parenchymal echogenicity, consistent with medical renal disease. No mass or hydronephrosis visualized. Left Kidney: Length: 10.0 cm. Mildly increased parenchymal echogenicity, consistent with medical renal disease. No mass or hydronephrosis visualized. Bladder: Appears normal for degree of bladder distention. IMPRESSION: Mildly increased renal parenchymal echogenicity, consistent with medical renal disease. No evidence of hydronephrosis. Electronically Signed   By: JoEarle Gell  M.D.   On: 03/07/2015 16:19    Labs: BMET  Recent Labs Lab 03/04/15 9449 03/04/15 1510 03/05/15 0855 03/05/15 1625 03/06/15 0550 03/07/15 0635 03/08/15 0245  NA 139  --  142  --  143 142 143  K 4.2  --  4.1  --  3.6 3.4* 3.3*  CL 97*  --  100*  --  107 111 108  CO2  --   --  33*  --  _0 GLUCOSE 137*  --  90  --  86 98 84  BUN 42*  --  48*  --  45* 46*  42*  CREATININE 1.90* 1.98* 2.58*  --  2.88* 2.89* 3.09*  CALCIUM  --   --  >15.0* 13.4* 12.3* 10.4* 10.4*   CBC  Recent Labs Lab 03/04/15 0929  03/04/15 1510 03/05/15 0855 03/06/15 0550 03/08/15 0245  WBC 8.5  --  7.6 8.9 8.8 13.9*  NEUTROABS 7.0  --   --   --   --   --   HGB 10.3*  < > 9.6* 9.2* 9.5* 9.3*  HCT 32.5*  < > 30.8*  28.9* 29.4* 29.9* 28.6*  MCV 95.3  --  94.8 94.5 94.9 92.3  PLT 217  --  204 209 202 212  < > = values in this interval not displayed.  Medications:    . amLODipine  10 mg Oral Daily  . aspirin EC  81 mg Oral Daily  . atorvastatin  80 mg Oral q1800  . carvedilol  3.125 mg Oral BID WC  . clopidogrel  75 mg Oral Daily  . enoxaparin (LOVENOX) injection  30 mg Subcutaneous Q24H  . ferrous sulfate  325 mg Oral TID  . hydrALAZINE  50 mg Oral 3 times per day  . insulin aspart  0-5 Units Subcutaneous QHS  . insulin aspart  0-9 Units Subcutaneous TID WC  . isosorbide mononitrate  30 mg Oral Daily  . mometasone-formoterol  1 puff Inhalation Daily      Otelia Santee, MD 03/08/2015, 10:30 AM

## 2015-03-08 NOTE — Progress Notes (Signed)
Patient Demographics:    [name], is a 65 y.o. female, DOB - 03/06/1950, [id number]  Admit date - 03/04/2015   Admitting Physician Waldemar Dickens, MD  Outpatient Primary MD for the patient is [name], MD  LOS - 3   Chief Complaint  Patient presents with  . Altered Mental Status  . Weakness        Subjective:    [name] today has, No headache, No chest pain, No abdominal pain - No Nausea, No new weakness tingling or numbness, No Cough - SOB. Improved.   Assessment  & Plan :     1. Severe metabolic encephalopathy due to severe hypercalcemia with dehydration and acute renal failure. Post IVF and Lasix, Calcium has improved, post calcitonin & Palmidronate. Renal following. Stable PTH, SPEP, ACE level, Vit D 25 levels, pending Vit D 1-25, UPEP, PTH rp Monitor CMP closely.   2. Essential hypertension. Continue on Norvasc, Imdur, lowered Coreg dose, hold clonidine as she was bradycardic.   3. ARF and CK D stage III. Plan as in #1 above. Outpt Renal follow up.   4. UPT on home oxygen, at baseline, continue supportive care.   5. Chronic diastolic dysfunction EF [id number] on rest and call. Currently mechanically compensated. Fluid and Lasix due to #1 above.   6. Mild asymptomatic bradycardia upon admission. Drop Coreg dose, discontinue clonidine. Monitor.    7. Mild leukocytosis on 03/08/2015. Repeat UA and chest x-ray, afebrile. Monitor.    8. DM type II. Check A1c place on sliding scale.  Lab Results  Component Value Date   HGBA1C 6.2* 03/04/2015    CBG (last 3)   Recent Labs  03/07/15 2206 03/08/15 0656 03/08/15 1140  GLUCAP 96 88 213*        Code Status : Full  Family Communication  : Family  Disposition Plan  : To be decided   Consults  :  Nephrology  Procedures  : CT head. Nonacute  DVT Prophylaxis  :  Lovenox    Lab Results  Component Value Date   PLT 212 03/08/2015    Inpatient Medications  Scheduled Meds: . amLODipine  10 mg Oral Daily  . aspirin EC  81 mg Oral Daily  . atorvastatin  80 mg Oral q1800  . carvedilol  3.125 mg Oral BID WC  . clopidogrel  75 mg Oral Daily  . [START ON 03/09/2015] enoxaparin (LOVENOX) injection  30 mg Subcutaneous Q24H  . ferrous sulfate  325 mg Oral TID  . hydrALAZINE  50 mg Oral 3 times per day  . insulin aspart  0-5 Units Subcutaneous QHS  . insulin aspart  0-9 Units Subcutaneous TID WC  . isosorbide mononitrate  30 mg Oral Daily  . mometasone-formoterol  1 puff Inhalation Daily   Continuous Infusions:   PRN Meds:.acetaminophen **OR** [DISCONTINUED] acetaminophen, albuterol, hydrALAZINE, [DISCONTINUED] ondansetron **OR** ondansetron (ZOFRAN) IV  Antibiotics  :    Anti-infectives    None        Objective:   Filed Vitals:   03/08/15 0124 03/08/15 0625 03/08/15 0654 03/08/15 1045  BP: 168/48 183/47 156/54 153/83  Pulse: 87 100  99  Temp: 98.2 F (36.8 C) 98.6 F (37 C)  98.1 F (36.7  C)  TempSrc: Oral Oral  Oral  Resp: 17 18  18   Height:      Weight:      SpO2: 89% 91%  98%    Wt Readings from Last 3 Encounters:  03/07/15 63.504 kg (140 lb)  02/20/15 55.067 kg (121 lb 6.4 oz)  02/17/15 58.423 kg (128 lb 12.8 oz)     Intake/Output Summary (Last 24 hours) at 03/08/15 1159 Last data filed at 03/08/15 0859  Gross per 24 hour  Intake    122 ml  Output      0 ml  Net    122 ml     Physical Exam  Awake , more alert than yesterday, No new F.N deficits, Normal affect [place].AT,PERRAL Supple Neck,No JVD, No cervical lymphadenopathy appriciated.  Symmetrical Chest wall movement, Good air movement bilaterally, CTAB RRR,No Gallops,Rubs or new Murmurs, No Parasternal Heave +ve B.Sounds, Abd Soft, No tenderness, No organomegaly appriciated, No rebound - guarding  or rigidity. No Cyanosis, Clubbing or edema, No new Rash or bruise      Data Review:   Micro Results No results found for this or any previous visit (from the past 240 hour(s)).  Radiology Reports Dg Chest 2 View  03/04/2015  CLINICAL DATA:  Altered mental status today. Bilateral upper extremity weakness. EXAM: CHEST  2 VIEW COMPARISON:  PA and lateral chest 01/21/2015 and 09/07/2012. CT chest 12/14/2011. FINDINGS: There is cardiomegaly and mild vascular congestion. No consolidative process, pneumothorax or effusion is identified. IMPRESSION: No acute disease. Cardiomegaly and vascular congestion. Electronically Signed   By: Inge Rise M.D.   On: 03/04/2015 10:25   Ct Head Wo Contrast  03/04/2015  CLINICAL DATA:  Altered mental status, weakness, difficulty with ambulation EXAM: CT HEAD WITHOUT CONTRAST TECHNIQUE: Contiguous axial images were obtained from the base of the skull through the vertex without intravenous contrast. COMPARISON:  08/10/2004 FINDINGS: There is no evidence of mass effect, midline shift, or extra-axial fluid collections. There is no evidence of a space-occupying lesion or intracranial hemorrhage. There is no evidence of a cortical-based area of acute infarction. There is generalized cerebral atrophy. There is periventricular white matter low attenuation likely secondary to microangiopathy. The ventricles and sulci are appropriate for the patient's age. The basal cisterns are patent. Visualized portions of the orbits are unremarkable. The visualized portions of the paranasal sinuses and mastoid air cells are unremarkable. Cerebrovascular atherosclerotic calcifications are noted. The osseous structures are unremarkable. IMPRESSION: 1. No acute intracranial pathology. 2. Chronic microvascular disease and cerebral atrophy. Electronically Signed   By: Kathreen Devoid   On: 03/04/2015 10:36   US Renal  03/07/2015  CLINICAL DATA:  Acute kidney failure.  Diabetes and hypertension.  EXAM: RENAL / URINARY TRACT ULTRASOUND COMPLETE COMPARISON:  09/08/2012 FINDINGS: Right Kidney: Length: 11.1 cm. Mildly increased parenchymal echogenicity, consistent with medical renal disease. No mass or hydronephrosis visualized. Left Kidney: Length: 10.0 cm. Mildly increased parenchymal echogenicity, consistent with medical renal disease. No mass or hydronephrosis visualized. Bladder: Appears normal for degree of bladder distention. IMPRESSION: Mildly increased renal parenchymal echogenicity, consistent with medical renal disease. No evidence of hydronephrosis. Electronically Signed   By: Earle Gell M.D.   On: 03/07/2015 16:19     CBC  Recent Labs Lab 03/04/15 0929 03/04/15 0952 03/04/15 1510 03/05/15 0855 03/06/15 0550 03/08/15 0245  WBC 8.5  --  7.6 8.9 8.8 13.9*  HGB 10.3* 11.2* 9.6* 9.2* 9.5* 9.3*  HCT 32.5* 33.0* 30.8*  28.9* 29.4* 29.9*  28.6*  PLT 217  --  204 209 202 212  MCV 95.3  --  94.8 94.5 94.9 92.3  MCH 30.2  --  29.5 29.6 30.2 30.0  MCHC 31.7  --  31.2 31.3 31.8 32.5  RDW 15.0  --  14.8 14.9 14.7 14.2  LYMPHSABS 0.8  --   --   --   --   --   MONOABS 0.6  --   --   --   --   --   EOSABS 0.1  --   --   --   --   --   BASOSABS 0.1  --   --   --   --   --     Chemistries   Recent Labs Lab 03/04/15 0952 03/04/15 1510 03/05/15 0855 03/05/15 1625 03/06/15 0550 03/07/15 0635 03/08/15 0245  NA 139  --  142  --  143 142 143  K 4.2  --  4.1  --  3.6 3.4* 3.3*  CL 97*  --  100*  --  107 111 108  CO2  --   --  33*  --  29 24 23   GLUCOSE 137*  --  90  --  86 98 84  BUN 42*  --  48*  --  45* 46* 42*  CREATININE 1.90* 1.98* 2.58*  --  2.88* 2.89* 3.09*  CALCIUM  --   --  >15.0* 13.4* 12.3* 10.4* 10.4*  MG  --   --  1.5*  --  1.4* 2.0  --   AST  --   --  20  --  20 21 18   ALT  --   --  11*  --  11* 8* 9*  ALKPHOS  --   --  59  --  62 55 59  BILITOT  --   --  0.4  --  0.5 0.6 0.5    ------------------------------------------------------------------------------------------------------------------ No results for input(s): CHOL, HDL, LDLCALC, TRIG, CHOLHDL, LDLDIRECT in the last 72 hours.  Lab Results  Component Value Date   HGBA1C 6.2* 03/04/2015   ------------------------------------------------------------------------------------------------------------------ No results for input(s): TSH, T4TOTAL, T3FREE, THYROIDAB in the last 72 hours.  Invalid input(s): FREET3 ------------------------------------------------------------------------------------------------------------------ No results for input(s): VITAMINB12, FOLATE, FERRITIN, TIBC, IRON, RETICCTPCT in the last 72 hours.  Coagulation profile No results for input(s): INR, PROTIME in the last 168 hours.  No results for input(s): DDIMER in the last 72 hours.  Cardiac Enzymes No results for input(s): CKMB, TROPONINI, MYOGLOBIN in the last 168 hours.  Invalid input(s): CK ------------------------------------------------------------------------------------------------------------------  Time Spent in minutes   35   SINGH,PRASHANT K M.D on 03/08/2015 at 11:59 AM  Between 7am to 7pm - Pager - [phone]  After 7pm go to [email or web address] - password [facility]  [facility] -  Office  [phone]

## 2015-03-09 LAB — CBC
HCT: 29.5 % — ABNORMAL LOW (ref 36.0–46.0)
HEMOGLOBIN: 9.6 g/dL — AB (ref 12.0–15.0)
MCH: 29.6 pg (ref 26.0–34.0)
MCHC: 32.5 g/dL (ref 30.0–36.0)
MCV: 91 fL (ref 78.0–100.0)
Platelets: 258 10*3/uL (ref 150–400)
RBC: 3.24 MIL/uL — AB (ref 3.87–5.11)
RDW: 14.4 % (ref 11.5–15.5)
WBC: 15 10*3/uL — ABNORMAL HIGH (ref 4.0–10.5)

## 2015-03-09 LAB — GLUCOSE, CAPILLARY
GLUCOSE-CAPILLARY: 138 mg/dL — AB (ref 65–99)
GLUCOSE-CAPILLARY: 242 mg/dL — AB (ref 65–99)
Glucose-Capillary: 172 mg/dL — ABNORMAL HIGH (ref 65–99)
Glucose-Capillary: 166 mg/dL — ABNORMAL HIGH (ref 65–99)

## 2015-03-09 LAB — COMPREHENSIVE METABOLIC PANEL
ALT: 11 U/L — ABNORMAL LOW (ref 14–54)
ANION GAP: 11 (ref 5–15)
AST: 24 U/L (ref 15–41)
Albumin: 2.3 g/dL — ABNORMAL LOW (ref 3.5–5.0)
Alkaline Phosphatase: 64 U/L (ref 38–126)
BUN: 54 mg/dL — ABNORMAL HIGH (ref 6–20)
CALCIUM: 9.9 mg/dL (ref 8.9–10.3)
CHLORIDE: 111 mmol/L (ref 101–111)
CO2: 21 mmol/L — AB (ref 22–32)
Creatinine, Ser: 3.45 mg/dL — ABNORMAL HIGH (ref 0.44–1.00)
GFR calc non Af Amer: 13 mL/min — ABNORMAL LOW (ref >60)
GFR, EST AFRICAN AMERICAN: 15 mL/min — AB (ref >60)
Glucose, Bld: 152 mg/dL — ABNORMAL HIGH (ref 65–99)
POTASSIUM: 3.5 mmol/L (ref 3.5–5.1)
SODIUM: 143 mmol/L (ref 135–145)
Total Bilirubin: 0.6 mg/dL (ref 0.3–1.2)
Total Protein: 5.6 g/dL — ABNORMAL LOW (ref 6.5–8.1)

## 2015-03-09 LAB — CREATININE, URINE, RANDOM: Creatinine, Urine: 40.95 mg/dL

## 2015-03-09 LAB — CALCITRIOL (1,25 DI-OH VIT D): Vit D, 1,25-Dihydroxy: <5 pg/mL — ABNORMAL LOW (ref 19.9–79.3)

## 2015-03-09 LAB — CALCIUM, IONIZED: CALCIUM, IONIZED, SERUM: 6 mg/dL — AB (ref 4.5–5.6)

## 2015-03-09 LAB — SODIUM, URINE, RANDOM: Sodium, Ur: 62 mmol/L

## 2015-03-09 MED ORDER — MOMETASONE FURO-FORMOTEROL FUM 100-5 MCG/ACT IN AERO
1.0000 | INHALATION_SPRAY | Freq: Every day | RESPIRATORY_TRACT | Status: DC
  Administered 2015-03-10 – 2015-03-12 (×3): 1 via RESPIRATORY_TRACT
  Filled 2015-03-09: qty 8.8

## 2015-03-09 MED ORDER — DEXTROSE 5 % IV SOLN
1.0000 g | INTRAVENOUS | Status: DC
  Administered 2015-03-09 – 2015-03-12 (×4): 1 g via INTRAVENOUS
  Filled 2015-03-09 (×5): qty 10

## 2015-03-09 MED ORDER — HYDRALAZINE HCL 50 MG PO TABS: 50.0000 mg | ORAL_TABLET | Freq: Three times a day (TID) | ORAL | Status: DC

## 2015-03-09 MED ORDER — CARVEDILOL 6.25 MG PO TABS: 6.2500 mg | ORAL_TABLET | Freq: Two times a day (BID) | ORAL | Status: DC

## 2015-03-09 MED ORDER — CARVEDILOL 3.125 MG PO TABS
3.1250 mg | ORAL_TABLET | Freq: Two times a day (BID) | ORAL | Status: DC
  Administered 2015-03-09 – 2015-03-12 (×6): 3.125 mg via ORAL
  Filled 2015-03-09 (×6): qty 1

## 2015-03-09 MED ORDER — INSULIN ASPART 100 UNIT/ML ~~LOC~~ SOLN: SUBCUTANEOUS | Status: DC

## 2015-03-09 MED ORDER — CEFPODOXIME PROXETIL 200 MG PO TABS: 200.0000 mg | ORAL_TABLET | Freq: Two times a day (BID) | ORAL | Status: DC

## 2015-03-09 NOTE — Progress Notes (Signed)
Foley cath d/c'd per MD'd request and awaiting patient to void.

## 2015-03-09 NOTE — Discharge Instructions (Signed)
Follow with Primary MD [name], MD in 7 days   Get CBC, CMP, ionized calcium, 2 view Chest X ray checked  by Primary MD next visit.    Activity: As tolerated with Full fall precautions use walker/cane & assistance as needed   Disposition CIR/SNF   Diet:   Heart Healthy  with feeding assistance and aspiration precautions.  For Heart failure patients - Check your Weight same time everyday, if you gain over 2 pounds, or you develop in leg swelling, experience more shortness of breath or chest pain, call your Primary MD immediately. Follow Cardiac Low Salt Diet and 1.5 lit/day fluid restriction.   On your next visit with your primary care physician please Get Medicines reviewed and adjusted.   Please request your Prim.MD to go over all Hospital Tests and Procedure/Radiological results at the follow up, please get all Hospital records sent to your Prim MD by signing hospital release before you go home.   If you experience worsening of your admission symptoms, develop shortness of breath, life threatening emergency, suicidal or homicidal thoughts you must seek medical attention immediately by calling 911 or calling your MD immediately  if symptoms less severe.  You Must read complete instructions/literature along with all the possible adverse reactions/side effects for all the Medicines you take and that have been prescribed to you. Take any new Medicines after you have completely understood and accpet all the possible adverse reactions/side effects.   Do not drive, operating heavy machinery, perform activities at heights, swimming or participation in water activities or provide baby sitting services if your were admitted for syncope or siezures until you have seen by Primary MD or a Neurologist and advised to do so again.  Do not drive when taking Pain medications.    Do not take more than prescribed Pain, Sleep and Anxiety Medications  Special Instructions: If you have  smoked or chewed Tobacco  in the last 2 yrs please stop smoking, stop any regular Alcohol  and or any Recreational drug use.  Wear Seat belts while driving.   Please note  You were cared for by a hospitalist during your hospital stay. If you have any questions about your discharge medications or the care you received while you were in the hospital after you are discharged, you can call the unit and asked to speak with the hospitalist on call if the hospitalist that took care of you is not available. Once you are discharged, your primary care physician will handle any further medical issues. Please note that NO REFILLS for any discharge medications will be authorized once you are discharged, as it is imperative that you return to your primary care physician (or establish a relationship with a primary care physician if you do not have one) for your aftercare needs so that they can reassess your need for medications and monitor your lab values.

## 2015-03-09 NOTE — Discharge Summary (Signed)
[name], is a 65 y.o. female  DOB December 10, 1950  MRN [id number].  Admission date:  03/04/2015  Admitting Physician  Waldemar Dickens, MD  Discharge Date:  03/09/2015   Primary MD  [name], MD  Recommendations for primary care physician for things to follow:   Repeat CBC, CMP and ionized calcium in 3-5 days.  Needs to follow-up with nephrology within a week   Admission Diagnosis  Bradycardia [R00.1] Generalized weakness [R53.1] Secondary hypertension, unspecified [I15.9] Altered mental status, unspecified altered mental status type [R41.82]   Discharge Diagnosis  Bradycardia [R00.1] Generalized weakness [R53.1] Secondary hypertension, unspecified [I15.9] Altered mental status, unspecified altered mental status type [R41.82]    Principal Problem:   Acute encephalopathy Active Problems:   OBESITY   Anemia, chronic disease   Chronic obstructive pulmonary disease (HCC)   Diabetes mellitus ([place])   Iron deficiency anemia   CAD (coronary artery disease)   Chronic diastolic CHF (congestive heart failure) (HCC)   Essential hypertension   Bradycardia   Acute renal failure superimposed on stage 3 chronic kidney disease (HCC)   Generalized weakness      Past Medical History  Diagnosis Date  . Peripheral vascular disease ([place])     a.s /p left common iliac and external iliac artery stents. b. H/o subclavian, mesenteric, and celiac artery stenosis (see below).  . Diabetes mellitus   . COPD (chronic obstructive pulmonary disease) (HCC)     home oxygen  . Hyperlipidemia   . Depression   . Chronic diastolic CHF (congestive heart failure) ([place])   . Essential hypertension   . Renal artery stenosis ([place])     a. Duplex 2014: no obvious evidence of hemodynamically significant stenosis >60%, bilateral  intrarenal arteries exhibit absent diastolic flow. There is evidence of elevated velocities of the mid aorta. 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  [name] CAD (coronary artery disease)   . Iron deficiency anemia   . Diabetic retinopathy ([place])   . 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 Carotid artery disease ([place])     a. Duplex 12/2014: left CEA patent with elevated velocities, 60-79% RICA (stable over serial exams), 123456 LICA stenosis (stable over serial exams), 50% LECA, elevated velocities in bilateral subclavian arteries.  . 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 CKD (chronic kidney disease), stage III   . Tobacco abuse   . Bradycardia     a. Limiting BB titration.  . Anemia, chronic disease 05/16/2008    Qualifier: Diagnosis of  By: Percival Spanish, MD, Farrel Gordon    . Dementia   . Protein-calorie malnutrition ([place]) 01/24/2015    Past Surgical History  Procedure Laterality Date  . Carotid endarterectomy    . Cholecystectomy    . Appendectomy    . Tonsillectomy    . Cesarean section    . Shoulder arthroscopy    .  Iliac artery stent      left  . Enteroscopy  01/26/2012    Procedure: ENTEROSCOPY;  Surgeon: Gatha Mayer, MD;  Location: [facility];  Service: Endoscopy;  Laterality: N/A;  . Cardiac catheterization N/A 01/23/2015    Procedure: Left Heart Cath and Coronary Angiography;  Surgeon: [name], MD;  Location: [facility];  Service: Cardiovascular;  Laterality: N/A;       HPI  from the history and physical done on the day of admission:     [name] is a 65 y.o. female past medical history that includes type 2 diabetes, hypertension, COPD, diastolic heart failure, on home oxygen, chronic kidney disease stage III presents emergency department from home chief complaint mental status and  generalized weakness. Initial evaluation in the emergency department concerning for acute on chronic kidney disease, acute encephalopathy setting of generalized weakness.  Information is obtained from the chart as unable to obtain information from patient and family's not bedside. Earlier husband reported 3 day history of gradual worsening generalized weakness decreased oral intake. Husband also reports patient was dropping objects having difficulty walking. Reportedly one fall last night she was walking towards bathroom. He states she did not hit her head and there was no loss of consciousness. No report or complaints of chest pain palpitations headache syncope or near-syncope. No complaints of abdominal pain nausea vomiting diarrhea dysuria hematuria frequency or urgency. No complaints of chills fever no recent travel or sick contacts. Husband reports at baseline she has some short-term memory issues and a mild to moderate speech impediment but she is able to communicate and make her wants and needs known  In the emergency department she is provided with 500 mils of normal saline Atrovent nebulizer.    Hospital Course:     1. Severe metabolic encephalopathy due to severe hypercalcemia with dehydration and acute renal failure. Post IVF and Lasix, Calcium has improved, she is status post calcitonin & Palmidronate. In here by nephrology. Stable PTH with appropriately low levels, SPEP, ACE level, Vit D 25 levels, pending Vit D 1-25, UPEP, PTH rp. Will need close outpatient renal follow-up, repeat CMP and ionized calcium in 3-5 days. Stopped Tums upon discharge.   2. Essential hypertension. Continue on Norvasc, Imdur and Coreg at present dose.   3. ARF and CK D stage III. Plan as in #1 above. Outpt Renal follow up.   4. UPT on home oxygen, at baseline, continue supportive care.   5. Chronic diastolic dysfunction EF 123456 on rest and call. Currently mechanically compensated. She'll he treated here  with Fluid and Lasix due to #1 above. Holding Lasix upon discharge is renal function is still poor, monitor weight and BMP, once creatinine has plateaued Lasix can be resumed if there is evidence of fluid overload.   6. Mild asymptomatic bradycardia upon admission. Dropped Coreg dose, discontinued clonidine. Her crit stable.   7. Mild leukocytosis on 03/08/2015.  Due to UTI, placed on Rocephin now, continue Vantin upon discharge, follow final culture results. Stop date for Vantin 03/12/2015.   8. DM type II.  was on right control, place on ISS for now.  Lab Results  Component Value Date   HGBA1C 6.2* 03/04/2015    CBG (last 3)   Recent Labs  03/08/15 1720 03/08/15 2102 03/09/15 0631  GLUCAP 168* 170* 138*     Discharge Condition: Fair  Follow UP  Follow-up Information    Follow up with [name], MD. Schedule an  appointment as soon as possible for a visit in 1 week.   Specialty:  Family Medicine   Contact information:   [address] [place] [address] [phone]       Follow up with Ulla Potash., MD. Schedule an appointment as soon as possible for a visit in 1 week.   Specialty:  Nephrology   Why:  Hypercalcemia, chronic kidney disease stage V   Contact information:   [name] [address] [phone]        Consults obtained - Renal  Diet and Activity recommendation: See Discharge Instructions below  Discharge Instructions       Discharge Instructions    Discharge instructions    Complete by:  As directed   Follow with Primary MD [name], MD in 7 days   Get CBC, CMP, ionized calcium, 2 view Chest X ray checked  by Primary MD next visit.    Activity: As tolerated with Full fall precautions use walker/cane & assistance as needed   Disposition CIR/SNF   Diet:   Heart Healthy  with feeding assistance and aspiration precautions.  For Heart failure patients - Check your Weight same time  everyday, if you gain over 2 pounds, or you develop in leg swelling, experience more shortness of breath or chest pain, call your Primary MD immediately. Follow Cardiac Low Salt Diet and 1.5 lit/day fluid restriction.   On your next visit with your primary care physician please Get Medicines reviewed and adjusted.   Please request your Prim.MD to go over all Hospital Tests and Procedure/Radiological results at the follow up, please get all Hospital records sent to your Prim MD by signing hospital release before you go home.   If you experience worsening of your admission symptoms, develop shortness of breath, life threatening emergency, suicidal or homicidal thoughts you must seek medical attention immediately by calling 911 or calling your MD immediately  if symptoms less severe.  You Must read complete instructions/literature along with all the possible adverse reactions/side effects for all the Medicines you take and that have been prescribed to you. Take any new Medicines after you have completely understood and accpet all the possible adverse reactions/side effects.   Do not drive, operating heavy machinery, perform activities at heights, swimming or participation in water activities or provide baby sitting services if your were admitted for syncope or siezures until you have seen by Primary MD or a Neurologist and advised to do so again.  Do not drive when taking Pain medications.    Do not take more than prescribed Pain, Sleep and Anxiety Medications  Special Instructions: If you have smoked or chewed Tobacco  in the last 2 yrs please stop smoking, stop any regular Alcohol  and or any Recreational drug use.  Wear Seat belts while driving.   Please note  You were cared for by a hospitalist during your hospital stay. If you have any questions about your discharge medications or the care you received while you were in the hospital after you are discharged, you can call the unit and  asked to speak with the hospitalist on call if the hospitalist that took care of you is not available. Once you are discharged, your primary care physician will handle any further medical issues. Please note that NO REFILLS for any discharge medications will be authorized once you are discharged, as it is imperative that you return to your primary care physician (or establish a relationship with  a primary care physician if you do not have one) for your aftercare needs so that they can reassess your need for medications and monitor your lab values.     Increase activity slowly    Complete by:  As directed              Discharge Medications       Medication List    STOP taking these medications        cloNIDine 0.1 MG tablet  Commonly known as:  CATAPRES     furosemide 80 MG tablet  Commonly known as:  LASIX     TUMS PO      TAKE these medications        acetaminophen 500 MG tablet  Commonly known as:  TYLENOL  Take 500 mg by mouth every 6 (six) hours as needed (pain).     albuterol 108 (90 Base) MCG/ACT inhaler  Commonly known as:  PROVENTIL HFA;VENTOLIN HFA  Inhale 2 puffs into the lungs every 6 (six) hours as needed for wheezing or shortness of breath.     amLODipine 10 MG tablet  Commonly known as:  NORVASC  Take 1 tablet (10 mg total) by mouth daily.     aspirin EC 81 MG tablet  Take 1 tablet (81 mg total) by mouth daily.     atorvastatin 80 MG tablet  Commonly known as:  LIPITOR  Take 1 tablet (80 mg total) by mouth daily at 6 PM.     carvedilol 6.25 MG tablet  Commonly known as:  COREG  Take 1 tablet (6.25 mg total) by mouth 2 (two) times daily with a meal.     cefpodoxime 200 MG tablet  Commonly known as:  VANTIN  Take 1 tablet (200 mg total) by mouth 2 (two) times daily. Stop date 03/12/2015     clopidogrel 75 MG tablet  Commonly known as:  PLAVIX  Take 1 tablet (75 mg total) by mouth daily.     fish oil-omega-3 fatty acids 1000 MG capsule  Take 1 g  by mouth 3 (three) times daily. Three times daily     Fluticasone Furoate-Vilanterol 100-25 MCG/INH Aepb  Commonly known as:  BREO ELLIPTA  Inhale 1 Inhaler into the lungs daily.     hydrALAZINE 50 MG tablet  Commonly known as:  APRESOLINE  Take 1 tablet (50 mg total) by mouth every 8 (eight) hours.     hydrocortisone 2.5 % lotion  Apply topically 2 (two) times daily.     Iron 325 (65 Fe) MG Tabs  Take 325 mg by mouth 3 (three) times daily.     isosorbide mononitrate 30 MG 24 hr tablet  Commonly known as:  IMDUR  Take 1 tablet (30 mg total) by mouth daily.     nitroGLYCERIN 0.4 MG SL tablet  Commonly known as:  NITROSTAT  Place 1 tablet (0.4 mg total) under the tongue every 5 (five) minutes as needed for chest pain.     sertraline 50 MG tablet  Commonly known as:  ZOLOFT  Take 1 tablet (50 mg total) by mouth daily.        Major procedures and Radiology Reports - PLEASE review detailed and final reports for all details, in brief -       Dg Chest 2 View  03/04/2015  CLINICAL DATA:  Altered mental status today. Bilateral upper extremity weakness. EXAM: CHEST  2 VIEW COMPARISON:  PA and lateral chest 01/21/2015 and 09/07/2012. CT chest  12/14/2011. FINDINGS: There is cardiomegaly and mild vascular congestion. No consolidative process, pneumothorax or effusion is identified. IMPRESSION: No acute disease. Cardiomegaly and vascular congestion. Electronically Signed   By: Inge Rise M.D.   On: 03/04/2015 10:25   Ct Head Wo Contrast  03/04/2015  CLINICAL DATA:  Altered mental status, weakness, difficulty with ambulation EXAM: CT HEAD WITHOUT CONTRAST TECHNIQUE: Contiguous axial images were obtained from the base of the skull through the vertex without intravenous contrast. COMPARISON:  08/10/2004 FINDINGS: There is no evidence of mass effect, midline shift, or extra-axial fluid collections. There is no evidence of a space-occupying lesion or intracranial hemorrhage. There is no  evidence of a cortical-based area of acute infarction. There is generalized cerebral atrophy. There is periventricular white matter low attenuation likely secondary to microangiopathy. The ventricles and sulci are appropriate for the patient's age. The basal cisterns are patent. Visualized portions of the orbits are unremarkable. The visualized portions of the paranasal sinuses and mastoid air cells are unremarkable. Cerebrovascular atherosclerotic calcifications are noted. The osseous structures are unremarkable. IMPRESSION: 1. No acute intracranial pathology. 2. Chronic microvascular disease and cerebral atrophy. Electronically Signed   By: Kathreen Devoid   On: 03/04/2015 10:36   US Renal  03/07/2015  CLINICAL DATA:  Acute kidney failure.  Diabetes and hypertension. EXAM: RENAL / URINARY TRACT ULTRASOUND COMPLETE COMPARISON:  09/08/2012 FINDINGS: Right Kidney: Length: 11.1 cm. Mildly increased parenchymal echogenicity, consistent with medical renal disease. No mass or hydronephrosis visualized. Left Kidney: Length: 10.0 cm. Mildly increased parenchymal echogenicity, consistent with medical renal disease. No mass or hydronephrosis visualized. Bladder: Appears normal for degree of bladder distention. IMPRESSION: Mildly increased renal parenchymal echogenicity, consistent with medical renal disease. No evidence of hydronephrosis. Electronically Signed   By: Earle Gell M.D.   On: 03/07/2015 16:19   Dg Chest Port 1 View  03/08/2015  CLINICAL DATA:  COPD, congestive heart failure.  Short of breath. EXAM: PORTABLE CHEST 1 VIEW COMPARISON:  03/04/2015 FINDINGS: Normal cardiac silhouette. Mild bronchitic markings centrally similar prior. Some linear nodular opacity at the RIGHT lung base. No pneumothorax. IMPRESSION: Mild linear nodular pattern RIGHT lung base could represent early pneumonia. Overall similar exam with central chronic bronchitic markings. Electronically Signed   By: Suzy Bouchard M.D.   On:  03/08/2015 12:56    Micro Results      No results found for this or any previous visit (from the past 240 hour(s)).     Today   Subjective    [place] today has no headache,no chest abdominal pain,no new weakness tingling or numbness, feels much better.   Objective   Blood pressure 152/62, pulse 104, temperature 98 F (36.7 C), temperature source Oral, resp. rate 16, height 4\' 10"  (1.473 m), weight 61.236 kg (135 lb), SpO2 92 %.   Intake/Output Summary (Last 24 hours) at 03/09/15 0955 Last data filed at 03/09/15 0619  Gross per 24 hour  Intake      0 ml  Output   1175 ml  Net  -1175 ml    Exam Awake , oriented x 2, generally weak all over, No new F.N deficits, Normal affect [place].AT,PERRAL Supple Neck,No JVD, No cervical lymphadenopathy appriciated.  Symmetrical Chest wall movement, Good air movement bilaterally, CTAB RRR,No Gallops,Rubs or new Murmurs, No Parasternal Heave +ve B.Sounds, Abd Soft, Non tender, No organomegaly appriciated, No rebound -guarding or rigidity. No Cyanosis, Clubbing or edema, No new Rash or bruise   Data Review   CBC w  Diff: Lab Results  Component Value Date   WBC 15.0* 03/09/2015   WBC 7.2 09/27/2012   HGB 9.6* 03/09/2015   HGB 10.9* 09/27/2012   HCT 29.5* 03/09/2015   HCT 28.9* 03/04/2015   HCT 31.6* 09/27/2012   PLT 258 03/09/2015   PLT 342 09/27/2012   LYMPHOPCT 9 03/04/2015   LYMPHOPCT 12.2* 09/27/2012   MONOPCT 7 03/04/2015   MONOPCT 6.5 09/27/2012   EOSPCT 1 03/04/2015   EOSPCT 1.8 09/27/2012   BASOPCT 1 03/04/2015   BASOPCT 1.0 09/27/2012    CMP: Lab Results  Component Value Date   NA 143 03/09/2015   NA 135* 12/27/2011   K 3.5 03/09/2015   K 4.5 12/27/2011   CL 111 03/09/2015   CL 98 12/27/2011   CO2 21* 03/09/2015   CO2 28 12/27/2011   BUN 54* 03/09/2015   BUN 60.0* 12/27/2011   CREATININE 3.45* 03/09/2015   CREATININE 1.47* 02/17/2015   CREATININE 4.77* 09/09/2012   CREATININE 1.5* 12/27/2011   PROT  5.6* 03/09/2015   ALBUMIN 2.3* 03/09/2015   BILITOT 0.6 03/09/2015   ALKPHOS 64 03/09/2015   AST 24 03/09/2015   ALT 11* 03/09/2015  .   Total Time in preparing paper work, data evaluation and todays exam - 35 minutes  Thurnell Lose M.D on 03/09/2015 at 9:55 AM  [facility]   Office  [phone]

## 2015-03-09 NOTE — Progress Notes (Signed)
Subjective: Interval History: has no complaint.  Objective: Vital signs in last 24 hours: Temp:  [97.9 F (36.6 C)-98.8 F (37.1 C)] 98.8 F (37.1 C) (01/16 1005) Pulse Rate:  [74-104] 102 (01/16 1005) Resp:  [16-19] 16 (01/16 1005) BP: (109-173)/(41-84) 109/84 mmHg (01/16 1005) SpO2:  [92 %-96 %] 95 % (01/16 1005) Weight:  [61.236 kg (135 lb)] 61.236 kg (135 lb) (01/16 0500) Weight change:   Intake/Output from previous day: 01/15 0701 - 01/16 0700 In: 120 [P.O.:120] Out: 1175 [Urine:1175] Intake/Output this shift:    General appearance: slowed mentation and pale, not talkative, obeys commands Resp: diminished breath sounds bilaterally Cardio: S1, S2 normal and systolic murmur: holosystolic 2/6, blowing at apex GI: pos bs, liver down 4 cm Extremities: extremities normal, atraumatic, no cyanosis or edema  Lab Results:  Recent Labs  03/08/15 0245 03/09/15 0704  WBC 13.9* 15.0*  HGB 9.3* 9.6*  HCT 28.6* 29.5*  PLT 212 258   BMET:  Recent Labs  03/08/15 0245 03/08/15 1954 03/09/15 0704  NA 143  --  143  K 3.3* 3.7 3.5  CL 108  --  111  CO2 23  --  21*  GLUCOSE 84  --  152*  BUN 42*  --  54*  CREATININE 3.09*  --  3.45*  CALCIUM 10.4*  --  9.9   No results for input(s): PTH in the last 72 hours. Iron Studies: No results for input(s): IRON, TIBC, TRANSFERRIN, FERRITIN in the last 72 hours.  Studies/Results: US Renal  03/07/2015  CLINICAL DATA:  Acute kidney failure.  Diabetes and hypertension. EXAM: RENAL / URINARY TRACT ULTRASOUND COMPLETE COMPARISON:  09/08/2012 FINDINGS: Right Kidney: Length: 11.1 cm. Mildly increased parenchymal echogenicity, consistent with medical renal disease. No mass or hydronephrosis visualized. Left Kidney: Length: 10.0 cm. Mildly increased parenchymal echogenicity, consistent with medical renal disease. No mass or hydronephrosis visualized. Bladder: Appears normal for degree of bladder distention. IMPRESSION: Mildly increased renal  parenchymal echogenicity, consistent with medical renal disease. No evidence of hydronephrosis. Electronically Signed   By: Earle Gell M.D.   On: 03/07/2015 16:19   Dg Chest Port 1 View  03/08/2015  CLINICAL DATA:  COPD, congestive heart failure.  Short of breath. EXAM: PORTABLE CHEST 1 VIEW COMPARISON:  03/04/2015 FINDINGS: Normal cardiac silhouette. Mild bronchitic markings centrally similar prior. Some linear nodular opacity at the RIGHT lung base. No pneumothorax. IMPRESSION: Mild linear nodular pattern RIGHT lung base could represent early pneumonia. Overall similar exam with central chronic bronchitic markings. Electronically Signed   By: Suzy Bouchard M.D.   On: 03/08/2015 12:56    I have reviewed the patient's current medications.  Assessment/Plan: 1 CKD4/AKI  Worsening renal function.  Suspect has tubular injury related to ^ Ca. Will check Urine chem. Cause of ^ Ca not defined yet.  Suspect malignancy 2 ^ Ca still elevated when corrected for Alb 3 AMS  4 Dementia 5 DM controlled 6 CAD 7 PVD 8 Hx CVA 9 Depression P follow Ca, check Urine Na,Cr.  Needs furthur w/U of ^ Ca.   LOS: 4 days   Timmi Devora L 03/09/2015,10:45 AM

## 2015-03-10 DIAGNOSIS — R531 Weakness: Secondary | ICD-10-CM

## 2015-03-10 DIAGNOSIS — R509 Fever, unspecified: Secondary | ICD-10-CM | POA: Insufficient documentation

## 2015-03-10 DIAGNOSIS — I25110 Atherosclerotic heart disease of native coronary artery with unstable angina pectoris: Secondary | ICD-10-CM

## 2015-03-10 DIAGNOSIS — D72829 Elevated white blood cell count, unspecified: Secondary | ICD-10-CM | POA: Insufficient documentation

## 2015-03-10 DIAGNOSIS — J449 Chronic obstructive pulmonary disease, unspecified: Secondary | ICD-10-CM

## 2015-03-10 DIAGNOSIS — F0280 Dementia in other diseases classified elsewhere without behavioral disturbance: Secondary | ICD-10-CM | POA: Insufficient documentation

## 2015-03-10 DIAGNOSIS — E870 Hyperosmolality and hypernatremia: Secondary | ICD-10-CM | POA: Insufficient documentation

## 2015-03-10 DIAGNOSIS — I5032 Chronic diastolic (congestive) heart failure: Secondary | ICD-10-CM

## 2015-03-10 DIAGNOSIS — G9340 Encephalopathy, unspecified: Secondary | ICD-10-CM

## 2015-03-10 DIAGNOSIS — I10 Essential (primary) hypertension: Secondary | ICD-10-CM

## 2015-03-10 DIAGNOSIS — E876 Hypokalemia: Secondary | ICD-10-CM

## 2015-03-10 DIAGNOSIS — G3183 Dementia with Lewy bodies: Secondary | ICD-10-CM

## 2015-03-10 DIAGNOSIS — N183 Chronic kidney disease, stage 3 (moderate): Secondary | ICD-10-CM

## 2015-03-10 DIAGNOSIS — D638 Anemia in other chronic diseases classified elsewhere: Secondary | ICD-10-CM

## 2015-03-10 DIAGNOSIS — N189 Chronic kidney disease, unspecified: Secondary | ICD-10-CM | POA: Insufficient documentation

## 2015-03-10 DIAGNOSIS — E1122 Type 2 diabetes mellitus with diabetic chronic kidney disease: Secondary | ICD-10-CM

## 2015-03-10 LAB — GLUCOSE, CAPILLARY
GLUCOSE-CAPILLARY: 148 mg/dL — AB (ref 65–99)
GLUCOSE-CAPILLARY: 130 mg/dL — AB (ref 65–99)
Glucose-Capillary: 122 mg/dL — ABNORMAL HIGH (ref 65–99)
Glucose-Capillary: 200 mg/dL — ABNORMAL HIGH (ref 65–99)

## 2015-03-10 LAB — COMPREHENSIVE METABOLIC PANEL
ALBUMIN: 2.1 g/dL — AB (ref 3.5–5.0)
ALBUMIN: 2.2 g/dL — AB (ref 3.5–5.0)
ALK PHOS: 61 U/L (ref 38–126)
ALT: 13 U/L — AB (ref 14–54)
ALT: 14 U/L (ref 14–54)
ANION GAP: 12 (ref 5–15)
AST: 24 U/L (ref 15–41)
AST: 28 U/L (ref 15–41)
Alkaline Phosphatase: 62 U/L (ref 38–126)
Anion gap: 12 (ref 5–15)
BUN: 64 mg/dL — ABNORMAL HIGH (ref 6–20)
BUN: 68 mg/dL — AB (ref 6–20)
CALCIUM: 9.5 mg/dL (ref 8.9–10.3)
CHLORIDE: 112 mmol/L — AB (ref 101–111)
CHLORIDE: 112 mmol/L — AB (ref 101–111)
CO2: 22 mmol/L (ref 22–32)
CO2: 21 mmol/L — ABNORMAL LOW (ref 22–32)
CREATININE: 4.02 mg/dL — AB (ref 0.44–1.00)
CREATININE: 4.07 mg/dL — AB (ref 0.44–1.00)
Calcium: 9.3 mg/dL (ref 8.9–10.3)
GFR calc Af Amer: 13 mL/min — ABNORMAL LOW (ref >60)
GFR calc Af Amer: 12 mL/min — ABNORMAL LOW (ref >60)
GFR calc non Af Amer: 11 mL/min — ABNORMAL LOW (ref >60)
GFR, EST NON AFRICAN AMERICAN: 11 mL/min — AB (ref >60)
GLUCOSE: 123 mg/dL — AB (ref 65–99)
GLUCOSE: 221 mg/dL — AB (ref 65–99)
POTASSIUM: 3.6 mmol/L (ref 3.5–5.1)
Potassium: 3.3 mmol/L — ABNORMAL LOW (ref 3.5–5.1)
SODIUM: 146 mmol/L — AB (ref 135–145)
Sodium: 145 mmol/L (ref 135–145)
Total Bilirubin: 0.4 mg/dL (ref 0.3–1.2)
Total Bilirubin: 0.2 mg/dL — ABNORMAL LOW (ref 0.3–1.2)
Total Protein: 5.3 g/dL — ABNORMAL LOW (ref 6.5–8.1)
Total Protein: 5.3 g/dL — ABNORMAL LOW (ref 6.5–8.1)

## 2015-03-10 LAB — URINALYSIS, ROUTINE W REFLEX MICROSCOPIC
BILIRUBIN URINE: NEGATIVE
GLUCOSE, UA: 250 mg/dL — AB
Ketones, ur: NEGATIVE mg/dL
Leukocytes, UA: NEGATIVE
Nitrite: NEGATIVE
PH: 7.5 (ref 5.0–8.0)
Protein, ur: >300 mg/dL — AB
SPECIFIC GRAVITY, URINE: 1.013 (ref 1.005–1.030)

## 2015-03-10 LAB — URINE CULTURE

## 2015-03-10 LAB — UIFE/LIGHT CHAINS/TP QN, 24-HR UR
% BETA, URINE: 18.4 %
ALBUMIN, U: 51.2 %
ALPHA 1 URINE: 10.4 %
ALPHA 2 UR: 11.1 %
FREE KAPPA/LAMBDA RATIO: 3.25 (ref 2.04–10.37)
FREE LT CHN EXCR RATE: 279 mg/L — AB (ref 1.35–24.19)
Free Lambda Lt Chains,Ur: 85.8 mg/L — ABNORMAL HIGH (ref 0.24–6.66)
GAMMA GLOBULIN URINE: 8.9 %
TOTAL PROTEIN, URINE-UPE24: 434.8 mg/dL

## 2015-03-10 LAB — URINE MICROSCOPIC-ADD ON: BACTERIA UA: NONE SEEN

## 2015-03-10 LAB — CALCIUM, IONIZED: CALCIUM, IONIZED, SERUM: 6.2 mg/dL — AB (ref 4.5–5.6)

## 2015-03-10 LAB — PTH-RELATED PEPTIDE

## 2015-03-10 MED ORDER — POTASSIUM CHLORIDE CRYS ER 20 MEQ PO TBCR
40.0000 meq | EXTENDED_RELEASE_TABLET | Freq: Once | ORAL | Status: AC
  Administered 2015-03-10: 40 meq via ORAL
  Filled 2015-03-10: qty 2

## 2015-03-10 MED ORDER — DIPHENHYDRAMINE HCL 25 MG PO CAPS
ORAL_CAPSULE | ORAL | Status: AC
  Filled 2015-03-10: qty 1

## 2015-03-10 MED ORDER — SODIUM CHLORIDE 0.9 % IV SOLN
INTRAVENOUS | Status: DC
  Administered 2015-03-10 – 2015-03-11 (×2): via INTRAVENOUS

## 2015-03-10 MED ORDER — TAMSULOSIN HCL 0.4 MG PO CAPS
0.4000 mg | ORAL_CAPSULE | Freq: Every day | ORAL | Status: DC
  Administered 2015-03-10 – 2015-03-12 (×3): 0.4 mg via ORAL
  Filled 2015-03-10 (×3): qty 1

## 2015-03-10 NOTE — Clinical Social Work Note (Signed)
CSW met with patient who is in agreement to going to SNF, patient does not have any bed offers yet, CSW has sent out to different facilities.  CSW to continue to follow patient's progress.   R. , MSW, LCSWA [phone]  

## 2015-03-10 NOTE — Progress Notes (Signed)
I met with [name], spouse, and daughter, [name], at bedside. We discussed [name]'s rehab venue options of SNF vs inpt rehab. [name] states that Tammy, with [facility] have offered them a bed for admission and that Tammy is aware that [name] is uninsured. Due to cost of inpt rehab, they prefer SNF. If [name] unable to get SNF bed in [facility], [name] is prepared to take her Mother home with her to provide the 24/7 assist she is aware that she needs. I have left a message for Marjorie Smolder, SW of this information. I will sign off. Please call me for any questions.[phone]

## 2015-03-10 NOTE — Consult Note (Signed)
Physical Medicine and Rehabilitation Consult Reason for Consult: Acute encephalopathy/multi-medical Referring Physician: [facility]   HPI: [name] is a 65 y.o. right handed female with history of type 2 diabetes mellitus, CAD maintained on aspirin and Plavix, hypertension, COPD with chronic O2, diastolic congestive heart failure, chronic renal insufficiency creatinine 1.90 and dementia. Patient lives with her husband and ?daughter and used a walker prior to admission and assistance as needed. Presented 03/04/2015 with altered mental status generalized weakness. Patient's husband had noted a three-day gradual decline in functional mobility as well as isolated fall while walking to the bathroom. No loss of consciousness. Husband reports at baseline she had some short-term memory issues and mild to moderate speech impediment but able to communicate and make her wants and needs known. Cranial CT scan negative for acute changes. Chest x-ray negative. Elevating creatinine from baseline 1.90-4.02. Placed on gentle IV fluids. Renal ultrasound showed no evidence of hydronephrosis. Lovenox for DVT prophylaxis. Urine culture greater than 100,000 Escherichia coli presently maintained on Rocephin. Tolerating a regular consistency diet. Initial physical therapy evaluation completed 03/07/2015. No other therapy has followed up since initial evaluation. M.D. has requested physical medicine rehabilitation consult.   Review of Systems  Constitutional: Negative for fever and chills.  HENT: Negative for hearing loss.   Eyes: Negative for blurred vision and double vision.  Respiratory: Negative for cough.        Shortness of breath with exertion  Cardiovascular: Positive for leg swelling. Negative for chest pain.  Gastrointestinal: Positive for constipation. Negative for nausea and vomiting.  Genitourinary: Negative for dysuria and hematuria.  Musculoskeletal: Positive for myalgias and falls.  Skin: Negative  for rash.  Neurological: Positive for weakness. Negative for seizures, loss of consciousness and headaches.  Psychiatric/Behavioral: Positive for memory loss.  All other systems reviewed and are negative.  Past Medical History  Diagnosis Date  . Peripheral vascular disease ([place])     a.s /p left common iliac and external iliac artery stents. b. H/o subclavian, mesenteric, and celiac artery stenosis (see below).  . Diabetes mellitus   . COPD (chronic obstructive pulmonary disease) ([facility])     home oxygen  . Hyperlipidemia   . Depression   . Chronic diastolic CHF (congestive heart failure) ([place])   . Essential hypertension   . Renal artery stenosis ([place])     a. Duplex 2014: no obvious evidence of hemodynamically significant stenosis >60%, bilateral intrarenal arteries exhibit absent diastolic flow. There is evidence of elevated velocities of the mid aorta. 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  [name] CAD (coronary artery disease)   . Iron deficiency anemia   . Diabetic retinopathy ([place])   . 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 Carotid artery disease ([place])     a. Duplex 12/2014: left CEA patent with elevated velocities, 60-79% RICA (stable over serial exams), [id number] LICA stenosis (stable over serial exams), 50% LECA, elevated velocities in bilateral subclavian arteries.  . Hypertensive heart disease with congestive heart failure ([place])   . CKD (chronic kidney disease), stage III   . Tobacco abuse   . Bradycardia     a. Limiting BB titration.  . Anemia, chronic disease 05/16/2008    Qualifier: Diagnosis of  By: Percival Spanish, MD, Farrel Gordon    . Dementia   . Protein-calorie malnutrition ([place]) 01/24/2015   Past Surgical  History  Procedure Laterality Date  . Carotid endarterectomy    . Cholecystectomy    . Appendectomy    . Tonsillectomy    . Cesarean  section    . Shoulder arthroscopy    . Iliac artery stent      left  . Enteroscopy  01/26/2012    Procedure: ENTEROSCOPY;  Surgeon: Gatha Mayer, MD;  Location: [facility];  Service: Endoscopy;  Laterality: N/A;  . Cardiac catheterization N/A 01/23/2015    Procedure: Left Heart Cath and Coronary Angiography;  Surgeon: [name], MD;  Location: [facility];  Service: Cardiovascular;  Laterality: N/A;   Family History  Problem Relation Age of Onset  . Hypertension Mother   . Heart attack Mother   . Hypertension Father   . Angina Father   . Cancer Sister   . Cancer Brother   . Stroke Neg Hx    Social History:  reports that she quit smoking about 3 weeks ago. Her smoking use included Cigarettes. She smoked 0.00 packs per day for 36 years. She has never used smokeless tobacco. She reports that she does not drink alcohol or use illicit drugs. Allergies: No Known Allergies Medications Prior to Admission  Medication Sig Dispense Refill  . acetaminophen (TYLENOL) 500 MG tablet Take 500 mg by mouth every 6 (six) hours as needed (pain).     [name] albuterol (PROVENTIL HFA;VENTOLIN HFA) 108 (90 BASE) MCG/ACT inhaler Inhale 2 puffs into the lungs every 6 (six) hours as needed for wheezing or shortness of breath. 1 Inhaler 2  . amLODipine (NORVASC) 10 MG tablet Take 1 tablet (10 mg total) by mouth daily. 30 tablet 2  . aspirin EC 81 MG tablet Take 1 tablet (81 mg total) by mouth daily. 30 tablet 2  . atorvastatin (LIPITOR) 80 MG tablet Take 1 tablet (80 mg total) by mouth daily at 6 PM. 30 tablet 2  . Calcium Carbonate Antacid (TUMS PO) Take 1 tablet by mouth daily as needed (acid reflux/heartburn).    . carvedilol (COREG) 12.5 MG tablet Take 1 tablet (12.5 mg total) by mouth 2 (two) times daily with a meal. 60 tablet 11  . cloNIDine (CATAPRES) 0.1 MG tablet Take 1 tablet (0.1 mg total) by mouth 2 (two) times daily. 60 tablet 2  . clopidogrel (PLAVIX) 75 MG tablet Take 1 tablet (75 mg  total) by mouth daily. 30 tablet 2  . Ferrous Sulfate (IRON) 325 (65 FE) MG TABS Take 325 mg by mouth 3 (three) times daily. 90 each 2  . fish oil-omega-3 fatty acids 1000 MG capsule Take 1 g by mouth 3 (three) times daily. Three times daily    . furosemide (LASIX) 80 MG tablet Take 1 tablet (80 mg total) by mouth daily. 30 tablet 11  . isosorbide mononitrate (IMDUR) 30 MG 24 hr tablet Take 1 tablet (30 mg total) by mouth daily. 30 tablet 2  . nitroGLYCERIN (NITROSTAT) 0.4 MG SL tablet Place 1 tablet (0.4 mg total) under the tongue every 5 (five) minutes as needed for chest pain. 10 tablet 0  . sertraline (ZOLOFT) 50 MG tablet Take 1 tablet (50 mg total) by mouth daily. 30 tablet 3  . Fluticasone Furoate-Vilanterol (BREO ELLIPTA) 100-25 MCG/INH AEPB Inhale 1 Inhaler into the lungs daily. (Patient not taking: Reported on 03/04/2015) 1 each 2  . hydrocortisone 2.5 % lotion Apply topically 2 (two) times daily. (Patient not taking: Reported on 03/04/2015) 59 mL 2    Home:  Home Living Family/patient expects to be discharged to:: Skilled nursing facility Living Arrangements: Spouse/significant other Additional Comments: uses home O2 per chart  Functional History: Prior Function Level of Independence: Needs assistance Gait / Transfers Assistance Needed: per pt (?reliable) uses RW modified independent ADL's / Homemaking Assistance Needed: mod A with ADL from husband Comments: no family present Functional Status:  Mobility: Bed Mobility Overal bed mobility: +2 for physical assistance, Needs Assistance Bed Mobility: Rolling, Sidelying to Sit Rolling: Min assist Sidelying to sit: Max assist Sit to supine: Total assist, +2 for physical assistance General bed mobility comments: moves slowly to instructions Transfers Overall transfer level: Needs assistance Equipment used: Rolling walker (2 wheeled) Transfers: Sit to/from Stand Sit to Stand: +2 physical assistance, Min assist, +2  safety/equipment Stand pivot transfers: Total assist, +2 physical assistance General transfer comment: pt grasps walker with bil hands with PT to anchor RW; posterior lean Ambulation/Gait Ambulation/Gait assistance: Mod assist, +2 safety/equipment Ambulation Distance (Feet): 6 Feet Assistive device: Rolling walker (2 wheeled) Gait Pattern/deviations: Step-through pattern, Decreased stride length, Decreased weight shift to right, Decreased weight shift to left, Leaning posteriorly, Shuffle, Trunk flexed General Gait Details: required full assist initially for weight shift and advancing LEs (?decr understanding what to do), after two total assist steps, she began walking much more smoothly Gait velocity: very slow Gait velocity interpretation: Below normal speed for age/gender    ADL: ADL Overall ADL's : Needs assistance/impaired Functional mobility during ADLs: +2 for physical assistance, Total assistance General ADL Comments: total A at this time.   Cognition: Cognition Overall Cognitive Status: No family/caregiver present to determine baseline cognitive functioning Orientation Level: Oriented to person, Oriented to place, Disoriented to situation, Disoriented to time Cognition Arousal/Alertness: Awake/alert Behavior During Therapy: Flat affect Overall Cognitive Status: No family/caregiver present to determine baseline cognitive functioning Memory: Decreased short-term memory (documented h/o dementia)  Blood pressure 160/51, pulse 90, temperature 99 F (37.2 C), temperature source Oral, resp. rate 20, height 4\' 10"  (1.473 m), weight 62.551 kg (137 lb 14.4 oz), SpO2 96 %. Physical Exam  Vitals reviewed. Constitutional: No distress.  65 year old frail Caucasian female sitting up in chair eating breakfast  HENT:  Head: Normocephalic and atraumatic.  Poor dentition  Eyes: Conjunctivae and EOM are normal.  Neck: Normal range of motion. Neck supple. No thyromegaly present.   Cardiovascular: Normal rate and regular rhythm.   Murmur heard. Respiratory: Effort normal and breath sounds normal. No respiratory distress.  GI: Soft. Bowel sounds are normal. She exhibits no distension.  Neurological: She is alert. She has normal reflexes.  Mood is flat but appropriate.  She makes good eye contact with examiner.  Follows simple commands.  A&Ox2 Limited medical historian Sensation intact to light touch Resting tremors Motor: B/l UE 4-/5 proximal to distal LLE: hip flexion 4/5, ankle doris/plantar flexion 4-/5  Skin: Skin is warm and dry.  Psychiatric: Her speech is normal and behavior is normal. Cognition and memory are impaired.    Results for orders placed or performed during the hospital encounter of 03/04/15 (from the past 24 hour(s))  Creatinine, urine, random     Status: None   Collection Time: 03/09/15 11:22 AM  Result Value Ref Range   Creatinine, Urine 40.95 mg/dL  Sodium, urine, random     Status: None   Collection Time: 03/09/15 11:22 AM  Result Value Ref Range   Sodium, Ur 62 mmol/L  Glucose, capillary     Status: Abnormal   Collection Time: 03/09/15 11:53 AM  Result Value Ref Range   Glucose-Capillary 242 (H) 65 - 99 mg/dL   Comment 1 Notify RN    Comment 2 Document in Chart   Glucose, capillary     Status: Abnormal   Collection Time: 03/09/15  4:55 PM  Result Value Ref Range   Glucose-Capillary 172 (H) 65 - 99 mg/dL   Comment 1 Notify RN    Comment 2 Document in Chart   Glucose, capillary     Status: Abnormal   Collection Time: 03/09/15  9:11 PM  Result Value Ref Range   Glucose-Capillary 166 (H) 65 - 99 mg/dL   Comment 1 Notify RN    Comment 2 Document in Chart   Comprehensive metabolic panel     Status: Abnormal   Collection Time: 03/10/15  5:44 AM  Result Value Ref Range   Sodium 146 (H) 135 - 145 mmol/L   Potassium 3.3 (L) 3.5 - 5.1 mmol/L   Chloride 112 (H) 101 - 111 mmol/L   CO2 22 22 - 32 mmol/L   Glucose, Bld 123 (H) 65 -  99 mg/dL   BUN 64 (H) 6 - 20 mg/dL   Creatinine, Ser 4.02 (H) 0.44 - 1.00 mg/dL   Calcium 9.5 8.9 - 10.3 mg/dL   Total Protein 5.3 (L) 6.5 - 8.1 g/dL   Albumin 2.1 (L) 3.5 - 5.0 g/dL   AST 24 15 - 41 U/L   ALT 13 (L) 14 - 54 U/L   Alkaline Phosphatase 61 38 - 126 U/L   Total Bilirubin 0.4 0.3 - 1.2 mg/dL   GFR calc non Af Amer 11 (L) >60 mL/min   GFR calc Af Amer 13 (L) >60 mL/min   Anion gap 12 5 - 15  Glucose, capillary     Status: Abnormal   Collection Time: 03/10/15  6:38 AM  Result Value Ref Range   Glucose-Capillary 122 (H) 65 - 99 mg/dL   Comment 1 Notify RN    Comment 2 Document in Chart    Dg Chest Port 1 View  03/08/2015  CLINICAL DATA:  COPD, congestive heart failure.  Short of breath. EXAM: PORTABLE CHEST 1 VIEW COMPARISON:  03/04/2015 FINDINGS: Normal cardiac silhouette. Mild bronchitic markings centrally similar prior. Some linear nodular opacity at the RIGHT lung base. No pneumothorax. IMPRESSION: Mild linear nodular pattern RIGHT lung base could represent early pneumonia. Overall similar exam with central chronic bronchitic markings. Electronically Signed   By: Suzy Bouchard M.D.   On: 03/08/2015 12:56    Assessment/Plan: Diagnosis: Debility secondary to acute encephalopathy Labs and images independently reviewed.  Records reviewed and summated above.  1. Does the need for close, 24 hr/day medical supervision in concert with the patient's rehab needs make it unreasonable for this patient to be served in a less intensive setting? Yes  2. Co-Morbidities requiring supervision/potential complications:  type 2 diabetes mellitus (Monitor in accordance with exercise and adjust meds as necessary), CAD (cont meds), HTN (monitor and provide prns in accordance with increased physical exertion and pain),COPD with chronic O2 (monitor RR and O2 Sats with increased physical exertion), diastolic congestive heart failure (cont to monitor fluid status and weights), chronic renal  insufficiency (avoid nephrotoxic meds), dementia, (consider workup for signs/symptoms of infection if warranted), hypernatremia (con to monitor and consider restriction if necessary), hypokalemia (replete as necessary, consider Mag lab), leukocytosis with pyrexia (cont to monitor and consider further workup and/or change in abx if WBCs cont to trend up or pt continues to have  fevers), anemia (transfuse if necessary to ensure appropriate perfusion for increased activity tolerance) 3. Due to safety, skin/wound care, disease management, medication administration and patient education, does the patient require 24 hr/day rehab nursing? Yes 4. Does the patient require coordinated care of a physician, rehab nurse, PT (1-2 hrs/day, 5 days/week), OT (1-2 hrs/day, 5 days/week) and SLP (1-2 hrs/day, 5 days/week) to address physical and functional deficits in the context of the above medical diagnosis(es)? Yes Addressing deficits in the following areas: endurance, locomotion, strength, transferring, toileting, cognition and psychosocial support 5. Can the patient actively participate in an intensive therapy program of at least 3 hrs of therapy per day at least 5 days per week? Yes 6. The potential for patient to make measurable gains while on inpatient rehab is excellent 7. Anticipated functional outcomes upon discharge from inpatient rehab are modified independent and supervision  with PT, modified independent and supervision with OT, supervision and min assist with SLP. 8. Estimated rehab length of stay to reach the above functional goals is: 10-12 days. 9. Does the patient have adequate social supports and living environment to accommodate these discharge functional goals? Potentially 10. Anticipated D/C setting: Home 11. Anticipated post D/C treatments: HH therapy and Home excercise program 12. Overall Rehab/Functional Prognosis: good  RECOMMENDATIONS: This patient's condition is appropriate for continued  rehabilitative care in the following setting: Need to clarify pt's discharge disposition and availability for support at discharge.  If pt has support would recommend CIR once acute medical workup completed. Patient has agreed to participate in recommended program. Potentially Note that insurance prior authorization may be required for reimbursement for recommended care.  Comment: Rehab Admissions Coordinator to follow up.  Delice Lesch, MD 03/10/2015

## 2015-03-10 NOTE — Progress Notes (Signed)
Subjective: Interval History: has no complaint .  Objective: Vital signs in last 24 hours: Temp:  [98.1 F (36.7 C)-101 F (38.3 C)] 98.4 F (36.9 C) (01/17 1000) Pulse Rate:  [79-109] 79 (01/17 1000) Resp:  [15-20] 15 (01/17 1000) BP: (136-160)/(46-79) 148/46 mmHg (01/17 1000) SpO2:  [94 %-98 %] 98 % (01/17 1000) Weight:  [62.551 kg (137 lb 14.4 oz)] 62.551 kg (137 lb 14.4 oz) (01/17 0500) Weight change: 1.315 kg (2 lb 14.4 oz)  Intake/Output from previous day: 01/16 0701 - 01/17 0700 In: -  Out: 1280 [Urine:1280] Intake/Output this shift: Total I/O In: 240 [P.O.:240] Out: -   General appearance: cooperative, mildly obese, pale and slowed mentation Resp: diminished breath sounds bilaterally Cardio: S1, S2 normal and systolic murmur: holosystolic 2/6, blowing at apex GI: obese, pos bs, soft Extremities: extremities normal, atraumatic, no cyanosis or edema  Lab Results:  Recent Labs  03/08/15 0245 03/09/15 0704  WBC 13.9* 15.0*  HGB 9.3* 9.6*  HCT 28.6* 29.5*  PLT 212 258   BMET:  Recent Labs  03/09/15 0704 03/10/15 0544  NA 143 146*  K 3.5 3.3*  CL 111 112*  CO2 21* 22  GLUCOSE 152* 123*  BUN 54* 64*  CREATININE 3.45* 4.02*  CALCIUM 9.9 9.5   No results for input(s): PTH in the last 72 hours. Iron Studies: No results for input(s): IRON, TIBC, TRANSFERRIN, FERRITIN in the last 72 hours.  Studies/Results: Dg Chest Port 1 View  03/08/2015  CLINICAL DATA:  COPD, congestive heart failure.  Short of breath. EXAM: PORTABLE CHEST 1 VIEW COMPARISON:  03/04/2015 FINDINGS: Normal cardiac silhouette. Mild bronchitic markings centrally similar prior. Some linear nodular opacity at the RIGHT lung base. No pneumothorax. IMPRESSION: Mild linear nodular pattern RIGHT lung base could represent early pneumonia. Overall similar exam with central chronic bronchitic markings. Electronically Signed   By: Suzy Bouchard M.D.   On: 03/08/2015 12:56    I have reviewed the  patient's current medications.  Assessment/Plan: 1 AKI worsening,  Suspect ATN from Hypercalcemia. Mild acidemia , agree with low vol ivf 2 anemia stable 3 ^Ca still ^, cause not clear 4 Dementia 5 DM controlled 6 CAD 7 PVD 8 HTN P Ua, check chem in am ,  ivf    LOS: 5 days   [name] L 03/10/2015,10:19 AM

## 2015-03-10 NOTE — Clinical Social Work Note (Addendum)
Clinical Social Work Assessment  Patient Details  Name: [name] MRN: [id number] Date of Birth: 29-Oct-1950  Date of referral:  03/10/15               Reason for consult:  Facility Placement                Permission sought to share information with:  Facility Sport and exercise psychologist, Family Supports Permission granted to share information::  Yes, Verbal Permission Granted  Name::     Lavella Lemons [phone]  Agency::  SNF admissions  Relationship::     Contact Information:     Housing/Transportation Living arrangements for the past 2 months:  Single Family Home Source of Information:  Patient, Spouse, Adult Children Patient Interpreter Needed:  None Criminal Activity/Legal Involvement Pertinent to Current Situation/Hospitalization:  No - Comment as needed Significant Relationships:  Adult Children, Spouse Lives with:  Spouse, Adult Children Do you feel safe going back to the place where you live?  No (Patient needs some short term rehab before returning back home.) Need for family participation in patient care:  Yes (Comment)  Care giving concerns:  Patient and family feel she needs some short term rehab in order to return back home.   Social Worker assessment / plan:  Patient is a 65 year old married female who lives with her husband and daughter.  Patient was very quiet and not very talkative, but she did smile and converse little.  Assessment was completed by talking to husband and daughter [name].  CSW explained to patient and her family about what is involved with SNF search process and also informed them that since patient does not have insurance she is going to have very limited locations.  Patient and family expressed that her husband has health issues, and daughter also has health issues and can not take care of patient at home currently.  Patient and family were explained the process and what to expect at rehab.  Patient and family did not have any other questions.  Employment  status:  Disabled (Comment on whether or not currently receiving Disability) Insurance information:  Self Pay (Medicaid Pending) PT Recommendations:  [facility] / Referral to community resources:     Patient/Family's Response to care:  Patient and family in agreement to going to SNF for short term rehab. Patient/Family's Understanding of and Emotional Response to Diagnosis, Current Treatment, and Prognosis:  Patient's family aware of diagnosis and current treatment plan. Emotional Assessment Appearance:  Appears stated age Attitude/Demeanor/Rapport:    Affect (typically observed):  Appropriate, Quiet Orientation:  Oriented to Self, Oriented to Place, Oriented to Situation Alcohol / Substance use:  Not Applicable Psych involvement (Current and /or in the community):  No (Comment)  Discharge Needs  Concerns to be addressed:  No discharge needs identified Readmission within the last 30 days:  No Current discharge risk:  None Barriers to Discharge:  No Barriers Identified   Ross Ludwig, LCSWA 03/10/2015, 8:23 AM

## 2015-03-10 NOTE — Progress Notes (Signed)
Physical Therapy Treatment Patient Details Name: [name] MRN: [id number] DOB: [birth year] Today's Date: 03/10/2015    History of Present Illness 65 y.o. female past medical history that includes type 2 diabetes, hypertension, COPD, diastolic heart failure, on home oxygen, chronic kidney disease stage III, dementia. Presetns with generalized weakness and AMS. Pt with encephalopathy.    PT Comments    Patient's cognition, bradykinesia, rigidity, and overall mobility improved. Per patient, she was independent with RW inside home PTA. Noted Social Worker note re: family wants short-term rehab for pt to achieve a level her spouse can manage. Noted patient has "Medicaid potentially" for insurance and will get no PT at SNF. Recommend CIR Consult as pt is improving and has family support. (Paged Dr. Candiss Norse re: d/c plan. Spoke with Karene Fry, Rehab Case Manager and she agrees with plan to proceed with CIR Consult).    Follow Up Recommendations  Supervision/Assistance - 24 hour;CIR     Equipment Recommendations  None recommended by PT    Recommendations for Other Services Rehab consult     Precautions / Restrictions Precautions Precautions: Fall    Mobility  Bed Mobility                  Transfers Overall transfer level: Needs assistance Equipment used: Rolling walker (2 wheeled) Transfers: Sit to/from Stand Sit to Stand: Mod assist         General transfer comment: Repeated cues for proper hand placement; minor retropulsion causes incr need for assist to come to stand  Ambulation/Gait Ambulation/Gait assistance: Min assist;+2 safety/equipment Ambulation Distance (Feet): 12 Feet (toileted, 12) Assistive device: Rolling walker (2 wheeled) Gait Pattern/deviations: Step-through pattern;Decreased stride length;Shuffle;Trunk flexed Gait velocity: very slow Gait velocity interpretation: Below normal speed for age/gender     Stairs            Wheelchair  Mobility    Modified Rankin (Stroke Patients Only)       Balance Overall balance assessment: Needs assistance   Sitting balance-Leahy Scale: Poor     Standing balance support: Bilateral upper extremity supported;During functional activity Standing balance-Leahy Scale: Poor (once achieves anterior wt shift over BOS, maintains minguard) Standing balance comment: stood x 4 minutes with min to minguard; again stood 2 minutes for pericare each time                    Cognition Arousal/Alertness: Awake/alert Behavior During Therapy: Flat affect Overall Cognitive Status: No family/caregiver present to determine baseline cognitive functioning       Memory: Decreased short-term memory (documented h/o dementia)              Exercises      General Comments General comments (skin integrity, edema, etc.): Spoke with Karene Fry, Rehab Case Manager      Pertinent Vitals/Pain Pain Assessment: No/denies pain    Home Living                      Prior Function            PT Goals (current goals can now be found in the care plan section) Acute Rehab PT Goals Patient Stated Goal: unable to state Time For Goal Achievement: 03/21/15 Progress towards PT goals: Progressing toward goals    Frequency  Min 3X/week    PT Plan Discharge plan needs to be updated    Co-evaluation             End of Session  Equipment Utilized During Treatment: Gait belt Activity Tolerance: Patient tolerated treatment well Patient left: in chair;with call bell/phone within reach;with chair alarm set     Time: [phone] PT Time Calculation (min) (ACUTE ONLY): 24 min  Charges:  $Gait Training: 8-22 mins $Therapeutic Activity: 8-22 mins                    G Codes:      Zitlaly Malson 2015-04-02, 9:16 AM Pager [phone]

## 2015-03-10 NOTE — NC FL2 (Signed)
MEDICAID FL2 LEVEL OF CARE SCREENING TOOL     IDENTIFICATION  Patient Name: [name] Birthdate: 1950/12/09 Sex: female Admission Date (Current Location): 03/04/2015  [facility] and [place] Number:  [employer] and Address:  [facility] [place] [address], [place], [place] [address]      Provider Number: [id number]  Attending Physician Name and Address:  Thurnell Lose, MD  Relative Name and Phone Number:  [name] Daughter [phone] or [phone]    Current Level of Care: Hospital Recommended Level of Care: [place] Prior Approval Number:    Date Approved/Denied:   PASRR Number: [id number]  Discharge Plan: SNF    Current Diagnoses: Patient Active Problem List   Diagnosis Date Noted  . Acute respiratory failure with hypoxia ([place]) 03/04/2015  . Acute encephalopathy 03/04/2015  . Acute renal failure superimposed on stage 3 chronic kidney disease ([place]) 03/04/2015  . Generalized weakness 03/04/2015  . Altered mental status   . AKI (acute kidney injury) ([place])   . Protein-calorie malnutrition ([place]) 01/24/2015  . Acute on chronic diastolic heart failure ([place])   . Abnormal nuclear stress test   . Acute chest pain 01/21/2015  . Scabies 01/10/2015  . Proteinuria 01/10/2015  . CKD (chronic kidney disease), stage III   . Hypertensive heart disease with congestive heart failure ([place])   . Carotid artery disease ([place])   . CAD (coronary artery disease)   . Chronic diastolic CHF (congestive heart failure) ([place])   . Essential hypertension   . Tobacco abuse   . Bradycardia   . CHF (congestive heart failure) ([place]) 12/24/2014  . On home oxygen therapy 11/10/2014  . Acute on chronic diastolic CHF (congestive heart failure) ([place]) 11/07/2014  . Gastric angiodysplasia 01/26/2012  . 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 Diabetes mellitus ([place]) 01/25/2012  . Iron deficiency anemia  01/25/2012  . Acute blood loss anemia 11/03/2011  . AVM (arteriovenous malformation) 11/03/2011  . GI bleed 11/03/2011  . Diarrhea 11/03/2011  . Renal artery stenosis ([place]) 11/19/2010  . Peripheral vascular disease ([place]) 11/19/2010  . HYPOGLYCEMIA 05/16/2008  . Hyperlipidemia 05/16/2008  . OBESITY 05/16/2008  . Anemia, chronic disease 05/16/2008  . ANXIETY DEPRESSION 05/16/2008  . DEPRESSION 05/16/2008  . TIA 05/16/2008  . History of CVA (cerebrovascular accident) 05/16/2008  . Chronic obstructive pulmonary disease ([place]) 05/16/2008  . DIVERTICULITIS, HX OF 05/16/2008    Orientation RESPIRATION BLADDER Height & Weight    Self, Place  Normal Incontinent 4\' 10"  (147.3 cm) 137 lbs.  BEHAVIORAL SYMPTOMS/MOOD NEUROLOGICAL BOWEL NUTRITION STATUS      Incontinent Diet  AMBULATORY STATUS COMMUNICATION OF NEEDS Skin   Limited Assist Verbally Normal                       Personal Care Assistance Level of Assistance  Bathing, Dressing Bathing Assistance: Limited assistance   Dressing Assistance: Limited assistance     Functional Limitations Info             SPECIAL CARE FACTORS FREQUENCY  PT (By licensed PT), OT (By licensed OT)     PT Frequency: 5x OT Frequency: 5x            Contractures      Additional Factors Info  Allergies, Code Status, Insulin Sliding Scale Code Status Info: Full Allergies Info: NKA   Insulin Sliding Scale Info: 3x a day  Current Medications (03/10/2015):  This is the current hospital active medication list Current Facility-Administered Medications  Medication Dose Route Frequency Provider Last Rate Last Dose  . acetaminophen (TYLENOL) tablet 650 mg  650 mg Oral Q6H PRN [name], NP   650 mg at 03/10/15 0125  . albuterol (PROVENTIL) (2.5 MG/3ML) 0.083% nebulizer solution 2.5 mg  2.5 mg Nebulization Q6H PRN Lezlie Octave Black, NP      . amLODipine (NORVASC) tablet 10 mg  10 mg Oral Daily [name], NP   10 mg at 03/09/15  1003  . aspirin EC tablet 81 mg  81 mg Oral Daily Lezlie Octave Black, NP   81 mg at 03/09/15 1003  . atorvastatin (LIPITOR) tablet 80 mg  80 mg Oral q1800 [name], NP   80 mg at 03/09/15 1826  . carvedilol (COREG) tablet 3.125 mg  3.125 mg Oral BID WC Mauricia Area, MD   3.125 mg at 03/09/15 1826  . cefTRIAXone (ROCEPHIN) 1 g in dextrose 5 % 50 mL IVPB  1 g Intravenous Q24H Thurnell Lose, MD   1 g at 03/09/15 1105  . clopidogrel (PLAVIX) tablet 75 mg  75 mg Oral Daily [name], NP   75 mg at 03/09/15 1003  . enoxaparin (LOVENOX) injection 30 mg  30 mg Subcutaneous Q24H Thurnell Lose, MD   30 mg at 03/09/15 1250  . ferrous sulfate tablet 325 mg  325 mg Oral TID [name], NP   325 mg at 03/09/15 2214  . hydrALAZINE (APRESOLINE) injection 10 mg  10 mg Intravenous Q6H PRN Thurnell Lose, MD   10 mg at 03/08/15 1354  . hydrALAZINE (APRESOLINE) tablet 50 mg  50 mg Oral 3 times per day Thurnell Lose, MD   50 mg at 03/10/15 0611  . insulin aspart (novoLOG) injection 0-5 Units  0-5 Units Subcutaneous QHS [name], NP   0 Units at 03/04/15 2354  . insulin aspart (novoLOG) injection 0-9 Units  0-9 Units Subcutaneous TID WC [name], NP   1 Units at 03/10/15 0724  . isosorbide mononitrate (IMDUR) 24 hr tablet 30 mg  30 mg Oral Daily Lezlie Octave Black, NP   30 mg at 03/09/15 1003  . mometasone-formoterol (DULERA) 100-5 MCG/ACT inhaler 1 puff  1 puff Inhalation Daily Thurnell Lose, MD   1 puff at 03/10/15 0620  . ondansetron (ZOFRAN) injection 4 mg  4 mg Intravenous Q6H PRN [name], NP         Discharge Medications: Please see discharge summary for a list of discharge medications.  Relevant Imaging Results:  Relevant Lab Results:   Additional Information    Tiago Humphrey, [place], LCSWA

## 2015-03-10 NOTE — Progress Notes (Signed)
Patient Demographics:    [name], is a 65 y.o. female, DOB - Nov 10, 1950, [id number]  Admit date - 03/04/2015   Admitting Physician Waldemar Dickens, MD  Outpatient Primary MD for the patient is [name], MD  LOS - 5  Summary  Is a pleasant 65 year old Caucasian female who lives at home with family was brought into the hospital with altered mental status and weakness, this was to be metabolic encephalopathy due to severe hypercalcemia causing dehydration and acute renal failure. Patient was seen by nephrology. She was hydrated with IV fluids, received Lasix, calcitonin along with pamidronate, calcium levels have improved, she has since developed urinary retention with UTI. She was technically discharged on 03/09/2015 but this was held back as on 03/10/2015 she has developed worsening of renal function with bladder retention. She will be hydrated. Renal has been informed to continue following up. She will eventually be discharged to either SNF or CIR.   Chief Complaint  Patient presents with  . Altered Mental Status  . Weakness        Subjective:    Amani Alper today has, No headache, No chest pain, No abdominal pain - No Nausea, No new weakness tingling or numbness, No Cough - SOB. Improved.   Assessment  & Plan :     1. Severe metabolic encephalopathy due to severe hypercalcemia with dehydration and acute renal failure. Post IVF and Lasix, Calcium has improved, post calcitonin & Palmidronate. Renal following. Stable PTH, SPEP, ACE level, Vit D 25 levels & Vit D 1-25, pending UPEP, PTH rp . Monitor CMP closely.   2. Essential hypertension. Continue on Norvasc, Imdur, lowered Coreg dose, hold clonidine as she was bradycardic.   3. ARF and CK D stage III. Tinea to hydrate, has  developed urinary retention will place on Flomax and Foley with outpatient urology follow-up. Renal on board.   4. UPT on home oxygen, at baseline, continue supportive care.   5. Chronic diastolic dysfunction EF [id number] on rest and call. Currently mechanically compensated. Fluid and Lasix due to #1 above.   6. Mild asymptomatic bradycardia upon admission. Drop Coreg dose, discontinue clonidine. Monitor.    7. Mild leukocytosis on 03/08/2015. Repeat UA consistent with UTI culture positive, placed on antibiotic, chest x-ray borderline but without cough or shortness of breath will monitor on Rocephin.    8. Urinary retention with UTI. IV Rocephin based on cultures, stop date 03/12/2015, Foley and Flomax. Monitor.   9. DM type II. Check A1c place on sliding scale.  Lab Results  Component Value Date   HGBA1C 6.2* 03/04/2015    CBG (last 3)   Recent Labs  03/09/15 1655 03/09/15 2111 03/10/15 0638  GLUCAP 172* 166* 122*        Code Status : Full  Family Communication  : Family  Disposition Plan  : CIR/SNF  Consults  : Nephrology  Procedures  : CT head. Nonacute  DVT Prophylaxis  :  Lovenox    Lab Results  Component Value Date   PLT 258 03/09/2015    Inpatient Medications  Scheduled Meds: . amLODipine  10 mg Oral Daily  . aspirin EC  81 mg Oral Daily  . atorvastatin  80 mg Oral q1800  .  carvedilol  3.125 mg Oral BID WC  . cefTRIAXone (ROCEPHIN)  IV  1 g Intravenous Q24H  . clopidogrel  75 mg Oral Daily  . diphenhydrAMINE      . enoxaparin (LOVENOX) injection  30 mg Subcutaneous Q24H  . ferrous sulfate  325 mg Oral TID  . hydrALAZINE  50 mg Oral 3 times per day  . insulin aspart  0-5 Units Subcutaneous QHS  . insulin aspart  0-9 Units Subcutaneous TID WC  . isosorbide mononitrate  30 mg Oral Daily  . mometasone-formoterol  1 puff Inhalation Daily  . potassium chloride  40 mEq Oral Once  . tamsulosin  0.4 mg Oral Daily   Continuous Infusions: . sodium  chloride     PRN Meds:.acetaminophen **OR** [DISCONTINUED] acetaminophen, albuterol, hydrALAZINE, [DISCONTINUED] ondansetron **OR** ondansetron (ZOFRAN) IV  Antibiotics  :    Anti-infectives    Start     Dose/Rate Route Frequency Ordered Stop   03/09/15 1100  cefTRIAXone (ROCEPHIN) 1 g in dextrose 5 % 50 mL IVPB     1 g 100 mL/hr over 30 Minutes Intravenous Every 24 hours 03/09/15 0953     03/09/15 0000  cefpodoxime (VANTIN) 200 MG tablet     200 mg Oral 2 times daily 03/09/15 0955          Objective:   Filed Vitals:   03/10/15 0101 03/10/15 0500 03/10/15 0620 03/10/15 1000  BP: 150/56 160/51  148/46  Pulse: 100 90  79  Temp: 101 F (38.3 C) 99 F (37.2 C)  98.4 F (36.9 C)  TempSrc: Oral Oral  Oral  Resp: 18 20  15   Height:      Weight:  62.551 kg (137 lb 14.4 oz)    SpO2: 95% 95% 96% 98%    Wt Readings from Last 3 Encounters:  03/10/15 62.551 kg (137 lb 14.4 oz)  02/20/15 55.067 kg (121 lb 6.4 oz)  02/17/15 58.423 kg (128 lb 12.8 oz)     Intake/Output Summary (Last 24 hours) at 03/10/15 1027 Last data filed at 03/10/15 0823  Gross per 24 hour  Intake    240 ml  Output   1280 ml  Net  -1040 ml     Physical Exam  Awake , more alert than yesterday, No new F.N deficits, Normal affect [place].AT,PERRAL Supple Neck,No JVD, No cervical lymphadenopathy appriciated.  Symmetrical Chest wall movement, Good air movement bilaterally, CTAB RRR,No Gallops,Rubs or new Murmurs, No Parasternal Heave +ve B.Sounds, Abd Soft, No tenderness, No organomegaly appriciated, No rebound - guarding or rigidity. No Cyanosis, Clubbing or edema, No new Rash or bruise      Data Review:   Micro Results Recent Results (from the past 240 hour(s))  Urine culture     Status: None   Collection Time: 03/08/15 11:47 AM  Result Value Ref Range Status   Specimen Description URINE, CATHETERIZED  Final   Special Requests NONE  Final   Culture >=100,000 COLONIES/mL ESCHERICHIA COLI  Final    Report Status 03/10/2015 FINAL  Final   Organism ID, Bacteria ESCHERICHIA COLI  Final      Susceptibility   Escherichia coli - MIC*    AMPICILLIN >=32 RESISTANT Resistant     CEFAZOLIN <=4 SENSITIVE Sensitive     CEFTRIAXONE <=1 SENSITIVE Sensitive     CIPROFLOXACIN >=4 RESISTANT Resistant     GENTAMICIN >=16 RESISTANT Resistant     IMIPENEM <=0.25 SENSITIVE Sensitive     NITROFURANTOIN <=16 SENSITIVE Sensitive  TRIMETH/SULFA >=320 RESISTANT Resistant     AMPICILLIN/SULBACTAM 16 INTERMEDIATE Intermediate     PIP/TAZO <=4 SENSITIVE Sensitive     * >=100,000 COLONIES/mL ESCHERICHIA COLI    Radiology Reports Dg Chest 2 View  03/04/2015  CLINICAL DATA:  Altered mental status today. Bilateral upper extremity weakness. EXAM: CHEST  2 VIEW COMPARISON:  PA and lateral chest 01/21/2015 and 09/07/2012. CT chest 12/14/2011. FINDINGS: There is cardiomegaly and mild vascular congestion. No consolidative process, pneumothorax or effusion is identified. IMPRESSION: No acute disease. Cardiomegaly and vascular congestion. Electronically Signed   By: Inge Rise M.D.   On: 03/04/2015 10:25   Ct Head Wo Contrast  03/04/2015  CLINICAL DATA:  Altered mental status, weakness, difficulty with ambulation EXAM: CT HEAD WITHOUT CONTRAST TECHNIQUE: Contiguous axial images were obtained from the base of the skull through the vertex without intravenous contrast. COMPARISON:  08/10/2004 FINDINGS: There is no evidence of mass effect, midline shift, or extra-axial fluid collections. There is no evidence of a space-occupying lesion or intracranial hemorrhage. There is no evidence of a cortical-based area of acute infarction. There is generalized cerebral atrophy. There is periventricular white matter low attenuation likely secondary to microangiopathy. The ventricles and sulci are appropriate for the patient's age. The basal cisterns are patent. Visualized portions of the orbits are unremarkable. The visualized  portions of the paranasal sinuses and mastoid air cells are unremarkable. Cerebrovascular atherosclerotic calcifications are noted. The osseous structures are unremarkable. IMPRESSION: 1. No acute intracranial pathology. 2. Chronic microvascular disease and cerebral atrophy. Electronically Signed   By: Kathreen Devoid   On: 03/04/2015 10:36   US Renal  03/07/2015  CLINICAL DATA:  Acute kidney failure.  Diabetes and hypertension. EXAM: RENAL / URINARY TRACT ULTRASOUND COMPLETE COMPARISON:  09/08/2012 FINDINGS: Right Kidney: Length: 11.1 cm. Mildly increased parenchymal echogenicity, consistent with medical renal disease. No mass or hydronephrosis visualized. Left Kidney: Length: 10.0 cm. Mildly increased parenchymal echogenicity, consistent with medical renal disease. No mass or hydronephrosis visualized. Bladder: Appears normal for degree of bladder distention. IMPRESSION: Mildly increased renal parenchymal echogenicity, consistent with medical renal disease. No evidence of hydronephrosis. Electronically Signed   By: Earle Gell M.D.   On: 03/07/2015 16:19   Dg Chest Port 1 View  03/08/2015  CLINICAL DATA:  COPD, congestive heart failure.  Short of breath. EXAM: PORTABLE CHEST 1 VIEW COMPARISON:  03/04/2015 FINDINGS: Normal cardiac silhouette. Mild bronchitic markings centrally similar prior. Some linear nodular opacity at the RIGHT lung base. No pneumothorax. IMPRESSION: Mild linear nodular pattern RIGHT lung base could represent early pneumonia. Overall similar exam with central chronic bronchitic markings. Electronically Signed   By: Suzy Bouchard M.D.   On: 03/08/2015 12:56     CBC  Recent Labs Lab 03/04/15 0929  03/04/15 1510 03/05/15 0855 03/06/15 0550 03/08/15 0245 03/09/15 0704  WBC 8.5  --  7.6 8.9 8.8 13.9* 15.0*  HGB 10.3*  < > 9.6* 9.2* 9.5* 9.3* 9.6*  HCT 32.5*  < > 30.8*  28.9* 29.4* 29.9* 28.6* 29.5*  PLT 217  --  204 209 202 212 258  MCV 95.3  --  94.8 94.5 94.9 92.3 91.0    MCH 30.2  --  29.5 29.6 30.2 30.0 29.6  MCHC 31.7  --  31.2 31.3 31.8 32.5 32.5  RDW 15.0  --  14.8 14.9 14.7 14.2 14.4  LYMPHSABS 0.8  --   --   --   --   --   --   MONOABS  0.6  --   --   --   --   --   --   EOSABS 0.1  --   --   --   --   --   --   BASOSABS 0.1  --   --   --   --   --   --   < > = values in this interval not displayed.  Chemistries   Recent Labs Lab 03/05/15 0855  03/06/15 0550 03/07/15 0635 03/08/15 0245 03/08/15 1954 03/09/15 0704 03/10/15 0544  NA 142  --  143 142 143  --  143 146*  K 4.1  --  3.6 3.4* 3.3* 3.7 3.5 3.3*  CL 100*  --  107 111 108  --  111 112*  CO2 33*  --  29 24 23   --  21* 22  GLUCOSE 90  --  86 98 84  --  152* 123*  BUN 48*  --  45* 46* 42*  --  54* 64*  CREATININE 2.58*  --  2.88* 2.89* 3.09*  --  3.45* 4.02*  CALCIUM >15.0*  < > 12.3* 10.4* 10.4*  --  9.9 9.5  MG 1.5*  --  1.4* 2.0  --   --   --   --   AST 20  --  20 21 18   --  24 24  ALT 11*  --  11* 8* 9*  --  11* 13*  ALKPHOS 59  --  62 55 59  --  64 61  BILITOT 0.4  --  0.5 0.6 0.5  --  0.6 0.4  < > = values in this interval not displayed. ------------------------------------------------------------------------------------------------------------------ No results for input(s): CHOL, HDL, LDLCALC, TRIG, CHOLHDL, LDLDIRECT in the last 72 hours.  Lab Results  Component Value Date   HGBA1C 6.2* 03/04/2015   ------------------------------------------------------------------------------------------------------------------ No results for input(s): TSH, T4TOTAL, T3FREE, THYROIDAB in the last 72 hours.  Invalid input(s): FREET3 ------------------------------------------------------------------------------------------------------------------ No results for input(s): VITAMINB12, FOLATE, FERRITIN, TIBC, IRON, RETICCTPCT in the last 72 hours.  Coagulation profile No results for input(s): INR, PROTIME in the last 168 hours.  No results for input(s): DDIMER in the last 72  hours.  Cardiac Enzymes No results for input(s): CKMB, TROPONINI, MYOGLOBIN in the last 168 hours.  Invalid input(s): CK ------------------------------------------------------------------------------------------------------------------  Time Spent in minutes   35   SINGH,PRASHANT K M.D on 03/10/2015 at 10:27 AM  Between 7am to 7pm - Pager - [phone]  After 7pm go to [email or web address] - password [facility]  [facility] -  Office  [phone]

## 2015-03-10 NOTE — Clinical Social Work Note (Signed)
Patient has a bed at [facility] [place] [facility] [place]. Facility will take 30 day room and board Letter of Guarantee/14 day LOG for PT/OT/ST.   Family aware. CSW remains available as needed.   Glendon Axe, MSW, LCSWA [phone] 03/10/2015 1:47 PM

## 2015-03-11 DIAGNOSIS — L8990 Pressure ulcer of unspecified site, unspecified stage: Secondary | ICD-10-CM | POA: Insufficient documentation

## 2015-03-11 DIAGNOSIS — N179 Acute kidney failure, unspecified: Secondary | ICD-10-CM

## 2015-03-11 DIAGNOSIS — R001 Bradycardia, unspecified: Secondary | ICD-10-CM

## 2015-03-11 LAB — COMPREHENSIVE METABOLIC PANEL
ALT: 16 U/L (ref 14–54)
AST: 25 U/L (ref 15–41)
Albumin: 2.1 g/dL — ABNORMAL LOW (ref 3.5–5.0)
Alkaline Phosphatase: 61 U/L (ref 38–126)
Anion gap: 11 (ref 5–15)
BILIRUBIN TOTAL: 0.3 mg/dL (ref 0.3–1.2)
BUN: 61 mg/dL — AB (ref 6–20)
CO2: 20 mmol/L — ABNORMAL LOW (ref 22–32)
CREATININE: 3.97 mg/dL — AB (ref 0.44–1.00)
Calcium: 8.5 mg/dL — ABNORMAL LOW (ref 8.9–10.3)
Chloride: 114 mmol/L — ABNORMAL HIGH (ref 101–111)
GFR calc Af Amer: 13 mL/min — ABNORMAL LOW (ref >60)
GFR, EST NON AFRICAN AMERICAN: 11 mL/min — AB (ref >60)
Glucose, Bld: 127 mg/dL — ABNORMAL HIGH (ref 65–99)
Potassium: 3.4 mmol/L — ABNORMAL LOW (ref 3.5–5.1)
Sodium: 145 mmol/L (ref 135–145)
TOTAL PROTEIN: 5.2 g/dL — AB (ref 6.5–8.1)

## 2015-03-11 LAB — GLUCOSE, CAPILLARY
GLUCOSE-CAPILLARY: 122 mg/dL — AB (ref 65–99)
GLUCOSE-CAPILLARY: 173 mg/dL — AB (ref 65–99)
Glucose-Capillary: 146 mg/dL — ABNORMAL HIGH (ref 65–99)
Glucose-Capillary: 188 mg/dL — ABNORMAL HIGH (ref 65–99)

## 2015-03-11 LAB — CBC
HCT: 24.8 % — ABNORMAL LOW (ref 36.0–46.0)
Hemoglobin: 7.7 g/dL — ABNORMAL LOW (ref 12.0–15.0)
MCH: 29.1 pg (ref 26.0–34.0)
MCHC: 31 g/dL (ref 30.0–36.0)
MCV: 93.6 fL (ref 78.0–100.0)
PLATELETS: 292 10*3/uL (ref 150–400)
RBC: 2.65 MIL/uL — ABNORMAL LOW (ref 3.87–5.11)
RDW: 14.6 % (ref 11.5–15.5)
WBC: 18 10*3/uL — ABNORMAL HIGH (ref 4.0–10.5)

## 2015-03-11 LAB — MAGNESIUM: Magnesium: 1.8 mg/dL (ref 1.7–2.4)

## 2015-03-11 LAB — PHOSPHORUS: PHOSPHORUS: 2.1 mg/dL — AB (ref 2.5–4.6)

## 2015-03-11 LAB — CALCIUM, IONIZED: CALCIUM, IONIZED, SERUM: 5.6 mg/dL (ref 4.5–5.6)

## 2015-03-11 MED ORDER — PANTOPRAZOLE SODIUM 40 MG PO TBEC: 40.0000 mg | DELAYED_RELEASE_TABLET | Freq: Every day | ORAL | Status: DC

## 2015-03-11 MED ORDER — POTASSIUM CHLORIDE CRYS ER 20 MEQ PO TBCR
40.0000 meq | EXTENDED_RELEASE_TABLET | Freq: Once | ORAL | Status: AC
  Administered 2015-03-11: 40 meq via ORAL
  Filled 2015-03-11: qty 2

## 2015-03-11 MED ORDER — FAMOTIDINE 20 MG PO TABS
20.0000 mg | ORAL_TABLET | Freq: Every day | ORAL | Status: DC
  Administered 2015-03-11 – 2015-03-12 (×2): 20 mg via ORAL
  Filled 2015-03-11 (×2): qty 1

## 2015-03-11 MED ORDER — MAGNESIUM SULFATE 50 % IJ SOLN
3.0000 g | Freq: Once | INTRAMUSCULAR | Status: AC
  Administered 2015-03-11: 3 g via INTRAVENOUS
  Filled 2015-03-11 (×2): qty 6

## 2015-03-11 NOTE — Progress Notes (Signed)
Occupational Therapy Treatment Patient Details Name: [name] MRN: [id number] DOB: 19-Jun-1950 Today's Date: 03/11/2015    History of present illness 65 y.o. female past medical history that includes type 2 diabetes, hypertension, COPD, diastolic heart failure, on home oxygen, chronic kidney disease stage III, dementia. Presetns with generalized weakness and AMS. Pt with encephalopathy.   OT comments  Pt making slow but steady progress. Pt was incontinent of bowel on OT arrival and required max verbal cues and physical initiation to sit on [facility] by therapist. Max assist required for pericare and pt has sores on sacrum/coccyx area that required thorough cleaning. Pt's daughter and husband report that she appears better cognitively today than earlier in the week. Will continue to follow acutely.    Follow Up Recommendations  SNF;Supervision/Assistance - 24 hour    Equipment Recommendations  Other (comment) (TBD in next venue)    Recommendations for Other Services      Precautions / Restrictions Precautions Precautions: Fall Restrictions Weight Bearing Restrictions: No       Mobility Bed Mobility Overal bed mobility: Needs Assistance Bed Mobility: Supine to Sit     Supine to sit: Mod assist;HOB elevated     General bed mobility comments: HOB elevated 60 degrees, use of bedrails and chuck pad to assist with rotating hips to sit EOB. Incr time and effort - pt was tearful due to sacral wound pain  Transfers Overall transfer level: Needs assistance Equipment used: Rolling walker (2 wheeled) Transfers: Sit to/from Stand Sit to Stand: Mod assist         General transfer comment: Mod assist for boost to stand and to stabilize RW. Max verbal cues and physical initiation by therapist to sit on BSC due to inability to follow commands consistently.     Balance Overall balance assessment: Needs assistance Sitting-balance support: No upper extremity supported;Feet  supported Sitting balance-Leahy Scale: Poor     Standing balance support: Bilateral upper extremity supported;During functional activity Standing balance-Leahy Scale: Poor                     ADL Overall ADL's : Needs assistance/impaired                         Toilet Transfer: Moderate assistance;Cueing for sequencing;Cueing for safety;Ambulation;BSC;RW Toilet Transfer Details (indicate cue type and reason): Verbal and physical assist for step sequence to position self in front of BSC. Physical cues to hips to initiate sitting. Toileting- Clothing Manipulation and Hygiene: Maximal assistance;Sit to/from stand       Functional mobility during ADLs: Moderate assistance;Rolling walker;Cueing for sequencing;Cueing for safety General ADL Comments: Pt incontinent of bowel on OT arrival. Pt had difficulty following commands and required physical initiation to sit on [facility] by therapist after max verbal cues. Max assist for pericare - pt with sacral sores that needed thorough cleaning after BM.       Vision                     Perception     Praxis      Cognition   Behavior During Therapy: Flat affect Overall Cognitive Status: Impaired/Different from baseline Area of Impairment: Orientation;Memory;Following commands;Safety/judgement;Problem solving;Awareness Orientation Level: Disoriented to;Time   Memory: Decreased short-term memory (documented hx of dementia)  Following Commands: Follows one step commands inconsistently;Follows one step commands with increased time Safety/Judgement: Decreased awareness of safety;Decreased awareness of deficits Awareness: Intellectual Problem Solving: Slow processing;Decreased initiation;Difficulty  sequencing;Requires verbal cues;Requires tactile cues General Comments: Pt's daughter and husband report that she seems better today.    Extremity/Trunk Assessment               Exercises     Shoulder Instructions        General Comments      Pertinent Vitals/ Pain       Pain Assessment: Faces Faces Pain Scale: Hurts whole lot Pain Location: sacrum area Pain Descriptors / Indicators: Constant;Grimacing;Sharp Pain Intervention(s): Limited activity within patient's tolerance;Monitored during session;Repositioned;Other (comment) (new sacral bandage applied)  Home Living                                          Prior Functioning/Environment              Frequency Min 2X/week     Progress Toward Goals  OT Goals(current goals can now be found in the care plan section)  Progress towards OT goals: Progressing toward goals  Acute Rehab OT Goals Patient Stated Goal: unable to state OT Goal Formulation: Patient unable to participate in goal setting Time For Goal Achievement: 03/20/15 Potential to Achieve Goals: Fair ADL Goals Pt Will Perform Eating: with set-up;sitting Pt Will Perform Grooming: with supervision;sitting Pt Will Perform Upper Body Bathing: with min assist;sitting Pt Will Transfer to Toilet: with mod assist;bedside commode;stand pivot transfer  Plan Discharge plan remains appropriate    Co-evaluation                 End of Session Equipment Utilized During Treatment: Gait belt;Rolling walker   Activity Tolerance Patient limited by pain   Patient Left in chair;with call bell/phone within reach;with nursing/sitter in room;with family/visitor present   Nurse Communication Mobility status        Time: [id number] OT Time Calculation (min): 30 min  Charges: OT General Charges $OT Visit: 1 Procedure OT Treatments $Self Care/Home Management : 23-37 mins  Redmond Baseman, OTR/L Pager: [phone] 03/11/2015, 5:28 PM

## 2015-03-11 NOTE — Progress Notes (Signed)
Subjective: Interval History: has no complaint .  Objective: Vital signs in last 24 hours: Temp:  [98.1 F (36.7 C)-98.6 F (37 C)] 98.6 F (37 C) (01/18 0618) Pulse Rate:  [79-111] 111 (01/18 0618) Resp:  [15-18] 18 (01/18 0618) BP: (129-148)/(39-59) 147/55 mmHg (01/18 0618) SpO2:  [94 %-100 %] 94 % (01/18 0618) Weight:  [61.372 kg (135 lb 4.8 oz)] 61.372 kg (135 lb 4.8 oz) (01/18 0500) Weight change: -1.179 kg (-2 lb 9.6 oz)  Intake/Output from previous day: 01/17 0701 - 01/18 0700 In: 1583.3 [P.O.:720; I.V.:813.3; IV Piggyback:50] Out: 200 [Urine:200] Intake/Output this shift: Total I/O In: -  Out: 350 [Urine:350]  General appearance: cooperative, mildly obese, pale and slowed mentation Resp: diminished breath sounds bilaterally Cardio: S1, S2 normal and systolic murmur: systolic ejection 2/6, crescendo at 2nd left intercostal space GI: obese,pos bs,liver down 4 cm Extremities: extremities normal, atraumatic, no cyanosis or edema  Lab Results:  Recent Labs  03/09/15 0704 03/11/15 0629  WBC 15.0* 18.0*  HGB 9.6* 7.7*  HCT 29.5* 24.8*  PLT 258 292   BMET:  Recent Labs  03/10/15 1025 03/11/15 0629  NA 145 145  K 3.6 3.4*  CL 112* 114*  CO2 21* 20*  GLUCOSE 221* 127*  BUN 68* 61*  CREATININE 4.07* 3.97*  CALCIUM 9.3 8.5*   No results for input(s): PTH in the last 72 hours. Iron Studies: No results for input(s): IRON, TIBC, TRANSFERRIN, FERRITIN in the last 72 hours.  Studies/Results: No results found.  I have reviewed the patient's current medications.  Assessment/Plan: 1 AKI ATN from ^ Ca, Cr stable, mild acidemia. Will cont ivf. 2 ^Ca never defined etio.  Discussed with primary 3 Dementia 4 CAD 5 DM controlled 6 COPD P IVF, follow Ca, chem ,w/u ^ Ca    LOS: 6 days   Aayan Haskew L 03/11/2015,9:55 AM

## 2015-03-11 NOTE — Care Management Note (Signed)
Case Management Note  Patient Details  Name: [name] MRN: [id number] Date of Birth: 12-Mar-1950  Subjective/Objective:                    Action/Plan: Awaiting SNF placement. Has SNF bed @ [facility]. Creatinine remains elevated @ 3.97. Will continue to follow.    Expected Discharge Date:   (Pending)               Expected Discharge Plan:     In-House Referral:     Discharge planning Services     Post Acute Care Choice:    Choice offered to:     DME Arranged:    DME Agency:     HH Arranged:    HH Agency:     Status of Service:  In process, will continue to follow  Medicare Important Message Given:    Date Medicare IM Given:    Medicare IM give by:    Date Additional Medicare IM Given:    Additional Medicare Important Message give by:     If discussed at [place] of Stay Meetings, dates discussed:    Additional Comments:  Delrae Sawyers, RN 03/11/2015, 11:54 AM

## 2015-03-11 NOTE — Progress Notes (Signed)
Pt sitting up in chair this am assisted with feeding breakfast by staff. No noted distress. Will continue to monitor.

## 2015-03-11 NOTE — Progress Notes (Signed)
Physical Therapy Treatment Patient Details Name: [name] MRN: [id number] DOB: February 06, 1951 Today's Date: 03/11/2015    History of Present Illness 65 y.o. female past medical history that includes type 2 diabetes, hypertension, COPD, diastolic heart failure, on home oxygen, chronic kidney disease stage III, dementia. Presetns with generalized weakness and AMS. Pt with encephalopathy.    PT Comments    Patient up in chair, completing lying over Lt armrest due to painful "bottom" See below re: skin issues. Due to severe pain, session limited.   Follow Up Recommendations  Supervision/Assistance - 24 hour;SNF     Equipment Recommendations  None recommended by PT    Recommendations for Other Services       Precautions / Restrictions Precautions Precautions: Fall Restrictions Weight Bearing Restrictions: No    Mobility  Bed Mobility Overal bed mobility: Needs Assistance Bed Mobility: Sit to Sidelying Rolling: Mod assist       Sit to sidelying: Total assist General bed mobility comments: pt fearful to return to sidelying and with incr pain  Transfers Overall transfer level: Needs assistance Equipment used: Rolling walker (2 wheeled) Transfers: Sit to/from Stand Sit to Stand: Mod assist         General transfer comment: Repeated cues for proper hand placement; minor retropulsion causes incr need for assist to come to stand; x2  Ambulation/Gait Ambulation/Gait assistance: Min assist Ambulation Distance (Feet): 12 Feet (toileted, 12) Assistive device: Rolling walker (2 wheeled) Gait Pattern/deviations: Step-through pattern;Shuffle;Decreased stride length;Trunk flexed Gait velocity: very slow   General Gait Details: assist to turn and maneuver RW around obstacles   Stairs            Wheelchair Mobility    Modified Rankin (Stroke Patients Only)       Balance     Sitting balance-Leahy Scale: Poor       Standing balance-Leahy Scale: Poor (once  achieves anterior wt shift over BOS, maintains minguard)                      Cognition Arousal/Alertness: Awake/alert Behavior During Therapy: Flat affect Overall Cognitive Status: No family/caregiver present to determine baseline cognitive functioning       Memory: Decreased short-term memory (documented h/o dementia)              Exercises      General Comments General comments (skin integrity, edema, etc.): RN notified pt has open wound over coccyx with no dressing. also notified IV fluids leaking down pt's arm      Pertinent Vitals/Pain Pain Assessment: Faces Faces Pain Scale: Hurts whole lot Pain Location: coccyx/sacrum Pain Descriptors / Indicators: Constant;Grimacing;Sharp Pain Intervention(s): Limited activity within patient's tolerance;Monitored during session;Repositioned;Patient requesting pain meds-RN notified    Home Living                      Prior Function            PT Goals (current goals can now be found in the care plan section) Acute Rehab PT Goals Patient Stated Goal: unable to state Time For Goal Achievement: 03/21/15 Progress towards PT goals: Progressing toward goals    Frequency  Min 3X/week    PT Plan Discharge plan needs to be updated    Co-evaluation             End of Session Equipment Utilized During Treatment: Gait belt Activity Tolerance: Patient limited by pain Patient left: with call bell/phone within reach;in bed (on  rt side)     Time: [id number] PT Time Calculation (min) (ACUTE ONLY): 31 min  Charges:  $Gait Training: 8-22 mins $Therapeutic Activity: 8-22 mins                    G Codes:      Arwen Haseley Mar 18, 2015, 11:04 AM Pager [phone]

## 2015-03-11 NOTE — Progress Notes (Signed)
Pt IV to left AC removed during therapy. Site assessed, no bleeding. Catheter in place. Dry dressing applied. Pt denies pain or discomfort. Attempted to start but was unsuccessful. IV team consult order placed.

## 2015-03-11 NOTE — Progress Notes (Signed)
[facility]                                                                              Patient Demographics  [name], is a 65 y.o. female, DOB - Jul 18, 1950, [id number]  Admit date - 03/04/2015   Admitting Physician Waldemar Dickens, MD  Outpatient Primary MD for the patient is [name], MD  LOS - 6   Chief Complaint  Patient presents with  . Altered Mental Status  . Weakness       Brief HPI   Patient is a pleasant 65 year old Caucasian female who lives at home with family was brought into the hospital with altered mental status and weakness, this was to be metabolic encephalopathy due to severe hypercalcemia causing dehydration and acute renal failure. Patient was seen by nephrology. She was hydrated with IV fluids, received Lasix, calcitonin along with pamidronate, calcium levels have improved, she has since developed urinary retention with UTI. She was technically discharged on 03/09/2015 but this was held back as on 03/10/2015 she has developed worsening of renal function with bladder retention. She will be hydrated. Renal has been informed to continue following up. She will eventually be discharged to either SNF or CIR.   Assessment & Plan   Severe metabolic encephalopathy due to severe hypercalcemia with dehydration and acute renal failure.  - Patient was admitted and placed on aggressive IV fluid hydration with Lasix for forced diuresis . She also received calcitonin and pamidronate. - Nephrology consulted  - Stable PTH, SPEP showed no M spike, ACE level, Vit D 25 levels & Vit D 1-25 - UPEP negative for M spike, PTH RP negative - Called patient's daughter, per daughter, Ms [name], patient had been eating "lot of Tums". Per patient's daughter, her brother had bought the bottle of Tums on Tuesday, 1/10 and she finished almost whole bottle within 24 hours. Per patient's daughter, it was unclear why she was taking that much Tums, may have  indigestion, patient did not really complain of any chest pain. - Recommended patient's family to recheck her labs in 1 week. No malignancy workup is indicated at this time if there is a reversible cause identified. - Renal ultrasound negative for any hydronephrosis  Leukocytosis, Escherichia coli UTI - WBC trending up, continue IV Rocephin  GERD - Placed on Pepcid  Hypokalemia, hypomagnesemia  - Replaced   Essential hypertension. - Continue on Norvasc, Imdur, lowered Coreg dose, hold clonidine as she was bradycardic.    ARF and CK D stage III. - Continue to gently hydrate, creatinine improving    UPT on home oxygen, at baseline, continue supportive care.  Chronic diastolic dysfunction EF [id number] on rest and call. Currently mechanically compensated. Fluid and Lasix due to #1 above.   Mild asymptomatic bradycardia upon admission. - Decreased Coreg dose, discontinue clonidine. Monitor.    Urinary retention with UTI.  - Continue IV Rocephin  DM type II.  - Continue sliding scale insulin    Code Status: Full code  Family Communication: Discussed in detail with the patient, all imaging results, lab results explained to the patient and patient's daughter  Disposition Plan: Skilled nursing facility, likely in a.m.  Time Spent in minutes   25 minutes  Procedures  Renal ultrasound CT head  Consults   Nephrology  DVT Prophylaxis  Lovenox   Medications  Scheduled Meds: . amLODipine  10 mg Oral Daily  . aspirin EC  81 mg Oral Daily  . atorvastatin  80 mg Oral q1800  . carvedilol  3.125 mg Oral BID WC  . cefTRIAXone (ROCEPHIN)  IV  1 g Intravenous Q24H  . clopidogrel  75 mg Oral Daily  . enoxaparin (LOVENOX) injection  30 mg Subcutaneous Q24H  . ferrous sulfate  325 mg Oral TID  . hydrALAZINE  50 mg Oral 3 times per day  . insulin aspart  0-5 Units Subcutaneous QHS  . insulin aspart  0-9 Units Subcutaneous TID WC  . isosorbide mononitrate  30 mg Oral Daily  .  mometasone-formoterol  1 puff Inhalation Daily  . tamsulosin  0.4 mg Oral Daily   Continuous Infusions: . sodium chloride 100 mL/hr at 03/11/15 1044   PRN Meds:.acetaminophen **OR** [DISCONTINUED] acetaminophen, albuterol, hydrALAZINE, [DISCONTINUED] ondansetron **OR** ondansetron (ZOFRAN) IV   Antibiotics   Anti-infectives    Start     Dose/Rate Route Frequency Ordered Stop   03/09/15 1100  cefTRIAXone (ROCEPHIN) 1 g in dextrose 5 % 50 mL IVPB     1 g 100 mL/hr over 30 Minutes Intravenous Every 24 hours 03/09/15 0953     03/09/15 0000  cefpodoxime (VANTIN) 200 MG tablet     200 mg Oral 2 times daily 03/09/15 [id number]          Subjective:   [name] was seen and examined today. Patient denies dizziness, chest pain, shortness of breath, abdominal pain, N/V/D/C, new weakness, numbess, tingling. No acute events overnight.    Objective:   Blood pressure 136/61, pulse 85, temperature 98.2 F (36.8 C), temperature source Oral, resp. rate 17, height 4\' 10"  (1.473 m), weight 61.372 kg (135 lb 4.8 oz), SpO2 96 %.  Wt Readings from Last 3 Encounters:  03/11/15 61.372 kg (135 lb 4.8 oz)  02/20/15 55.067 kg (121 lb 6.4 oz)  02/17/15 58.423 kg (128 lb 12.8 oz)     Intake/Output Summary (Last 24 hours) at 03/11/15 1316 Last data filed at 03/11/15 1049  Gross per 24 hour  Intake 1303.33 ml  Output    850 ml  Net 453.33 ml    Exam  General: Alert and oriented x 3, NAD  HEENT:  PERRLA, EOMI, Anicteric Sclera, mucous membranes moist.   Neck: Supple, no JVD, no masses  CVS: S1 S2 auscultated, no rubs, murmurs or gallops. Regular rate and rhythm.  Respiratory: Clear to auscultation bilaterally, no wheezing, rales or rhonchi  Abdomen: Soft, nontender, nondistended, + bowel sounds  Ext: no cyanosis clubbing or edema  Neuro: AAOx3, Cr N's II- XII. Strength 5/5 upper and lower extremities bilaterally  Skin: No rashes  Psych: Normal affect and demeanor, alert and oriented x3      Data Review   Micro Results Recent Results (from the past 240 hour(s))  Urine culture     Status: None   Collection Time: 03/08/15 11:47 AM  Result Value Ref Range Status   Specimen Description URINE, CATHETERIZED  Final   Special Requests NONE  Final   Culture >=100,000 COLONIES/mL ESCHERICHIA COLI  Final   Report Status 03/10/2015 FINAL  Final   Organism ID, Bacteria ESCHERICHIA COLI  Final      Susceptibility  Escherichia coli - MIC*    AMPICILLIN >=32 RESISTANT Resistant     CEFAZOLIN <=4 SENSITIVE Sensitive     CEFTRIAXONE <=1 SENSITIVE Sensitive     CIPROFLOXACIN >=4 RESISTANT Resistant     GENTAMICIN >=16 RESISTANT Resistant     IMIPENEM <=0.25 SENSITIVE Sensitive     NITROFURANTOIN <=16 SENSITIVE Sensitive     TRIMETH/SULFA >=320 RESISTANT Resistant     AMPICILLIN/SULBACTAM 16 INTERMEDIATE Intermediate     PIP/TAZO <=4 SENSITIVE Sensitive     * >=100,000 COLONIES/mL ESCHERICHIA COLI    Radiology Reports Dg Chest 2 View  03/04/2015  CLINICAL DATA:  Altered mental status today. Bilateral upper extremity weakness. EXAM: CHEST  2 VIEW COMPARISON:  PA and lateral chest 01/21/2015 and 09/07/2012. CT chest 12/14/2011. FINDINGS: There is cardiomegaly and mild vascular congestion. No consolidative process, pneumothorax or effusion is identified. IMPRESSION: No acute disease. Cardiomegaly and vascular congestion. Electronically Signed   By: Inge Rise M.D.   On: 03/04/2015 10:25   Ct Head Wo Contrast  03/04/2015  CLINICAL DATA:  Altered mental status, weakness, difficulty with ambulation EXAM: CT HEAD WITHOUT CONTRAST TECHNIQUE: Contiguous axial images were obtained from the base of the skull through the vertex without intravenous contrast. COMPARISON:  08/10/2004 FINDINGS: There is no evidence of mass effect, midline shift, or extra-axial fluid collections. There is no evidence of a space-occupying lesion or intracranial hemorrhage. There is no evidence of a cortical-based  area of acute infarction. There is generalized cerebral atrophy. There is periventricular white matter low attenuation likely secondary to microangiopathy. The ventricles and sulci are appropriate for the patient's age. The basal cisterns are patent. Visualized portions of the orbits are unremarkable. The visualized portions of the paranasal sinuses and mastoid air cells are unremarkable. Cerebrovascular atherosclerotic calcifications are noted. The osseous structures are unremarkable. IMPRESSION: 1. No acute intracranial pathology. 2. Chronic microvascular disease and cerebral atrophy. Electronically Signed   By: Kathreen Devoid   On: 03/04/2015 10:36   US Renal  03/07/2015  CLINICAL DATA:  Acute kidney failure.  Diabetes and hypertension. EXAM: RENAL / URINARY TRACT ULTRASOUND COMPLETE COMPARISON:  09/08/2012 FINDINGS: Right Kidney: Length: 11.1 cm. Mildly increased parenchymal echogenicity, consistent with medical renal disease. No mass or hydronephrosis visualized. Left Kidney: Length: 10.0 cm. Mildly increased parenchymal echogenicity, consistent with medical renal disease. No mass or hydronephrosis visualized. Bladder: Appears normal for degree of bladder distention. IMPRESSION: Mildly increased renal parenchymal echogenicity, consistent with medical renal disease. No evidence of hydronephrosis. Electronically Signed   By: Earle Gell M.D.   On: 03/07/2015 16:19   Dg Chest Port 1 View  03/08/2015  CLINICAL DATA:  COPD, congestive heart failure.  Short of breath. EXAM: PORTABLE CHEST 1 VIEW COMPARISON:  03/04/2015 FINDINGS: Normal cardiac silhouette. Mild bronchitic markings centrally similar prior. Some linear nodular opacity at the RIGHT lung base. No pneumothorax. IMPRESSION: Mild linear nodular pattern RIGHT lung base could represent early pneumonia. Overall similar exam with central chronic bronchitic markings. Electronically Signed   By: Suzy Bouchard M.D.   On: 03/08/2015 12:56    CBC  Recent  Labs Lab 03/05/15 0855 03/06/15 0550 03/08/15 0245 03/09/15 0704 03/11/15 0629  WBC 8.9 8.8 13.9* 15.0* 18.0*  HGB 9.2* 9.5* 9.3* 9.6* 7.7*  HCT 29.4* 29.9* 28.6* 29.5* 24.8*  PLT 209 202 212 258 292  MCV 94.5 94.9 92.3 91.0 93.6  MCH 29.6 30.2 30.0 29.6 29.1  MCHC 31.3 31.8 32.5 32.5 31.0  RDW 14.9 14.7 14.2 14.4  14.6    Chemistries   Recent Labs Lab 03/05/15 0855  03/06/15 0550 03/07/15 0635 03/08/15 0245 03/08/15 1954 03/09/15 0704 03/10/15 0544 03/10/15 1025 03/11/15 0629  NA 142  --  143 142 143  --  143 146* 145 145  K 4.1  --  3.6 3.4* 3.3* 3.7 3.5 3.3* 3.6 3.4*  CL 100*  --  107 111 108  --  111 112* 112* 114*  CO2 33*  --  29 24 23   --  21* 22 21* 20*  GLUCOSE 90  --  86 98 84  --  152* 123* 221* 127*  BUN 48*  --  45* 46* 42*  --  54* 64* 68* 61*  CREATININE 2.58*  --  2.88* 2.89* 3.09*  --  3.45* 4.02* 4.07* 3.97*  CALCIUM >15.0*  < > 12.3* 10.4* 10.4*  --  9.9 9.5 9.3 8.5*  MG 1.5*  --  1.4* 2.0  --   --   --   --   --  1.8  AST 20  --  20 21 18   --  24 24 28 25   ALT 11*  --  11* 8* 9*  --  11* 13* 14 16  ALKPHOS 59  --  62 55 59  --  64 61 62 61  BILITOT 0.4  --  0.5 0.6 0.5  --  0.6 0.4 0.2* 0.3  < > = values in this interval not displayed. ------------------------------------------------------------------------------------------------------------------ estimated creatinine clearance is 11.1 mL/min (by C-G formula based on Cr of 3.97). ------------------------------------------------------------------------------------------------------------------ No results for input(s): HGBA1C in the last 72 hours. ------------------------------------------------------------------------------------------------------------------ No results for input(s): CHOL, HDL, LDLCALC, TRIG, CHOLHDL, LDLDIRECT in the last 72 hours. ------------------------------------------------------------------------------------------------------------------ No results for input(s): TSH,  T4TOTAL, T3FREE, THYROIDAB in the last 72 hours.  Invalid input(s): FREET3 ------------------------------------------------------------------------------------------------------------------ No results for input(s): VITAMINB12, FOLATE, FERRITIN, TIBC, IRON, RETICCTPCT in the last 72 hours.  Coagulation profile No results for input(s): INR, PROTIME in the last 168 hours.  No results for input(s): DDIMER in the last 72 hours.  Cardiac Enzymes No results for input(s): CKMB, TROPONINI, MYOGLOBIN in the last 168 hours.  Invalid input(s): CK ------------------------------------------------------------------------------------------------------------------ Invalid input(s): [place]  03/10/15 0638 03/10/15 1139 03/10/15 1653 03/10/15 2314 03/11/15 0653 03/11/15 [place] 200* 148* 130* 122* 173*     Layani Foronda M.D. [facility] 03/11/2015, 1:16 PM  Pager: [phone] Between 7am to 7pm - call Pager - [phone]  After 7pm go to [email or web address] - password [email or web address]  Call night coverage person covering after 7pm

## 2015-03-12 LAB — GLUCOSE, CAPILLARY
GLUCOSE-CAPILLARY: 107 mg/dL — AB (ref 65–99)
GLUCOSE-CAPILLARY: 144 mg/dL — AB (ref 65–99)

## 2015-03-12 LAB — RENAL FUNCTION PANEL
ANION GAP: 6 (ref 5–15)
Albumin: 2 g/dL — ABNORMAL LOW (ref 3.5–5.0)
BUN: 51 mg/dL — ABNORMAL HIGH (ref 6–20)
CHLORIDE: 117 mmol/L — AB (ref 101–111)
CO2: 17 mmol/L — AB (ref 22–32)
CREATININE: 3.61 mg/dL — AB (ref 0.44–1.00)
Calcium: 7.9 mg/dL — ABNORMAL LOW (ref 8.9–10.3)
GFR, EST AFRICAN AMERICAN: 14 mL/min — AB (ref >60)
GFR, EST NON AFRICAN AMERICAN: 12 mL/min — AB (ref >60)
Glucose, Bld: 186 mg/dL — ABNORMAL HIGH (ref 65–99)
POTASSIUM: 4.4 mmol/L (ref 3.5–5.1)
Phosphorus: 1.7 mg/dL — ABNORMAL LOW (ref 2.5–4.6)
Sodium: 140 mmol/L (ref 135–145)

## 2015-03-12 MED ORDER — MIDODRINE HCL 5 MG PO TABS
ORAL_TABLET | ORAL | Status: AC
  Filled 2015-03-12: qty 2

## 2015-03-12 MED ORDER — CARVEDILOL 3.125 MG PO TABS
3.1250 mg | ORAL_TABLET | Freq: Two times a day (BID) | ORAL | Status: DC
Start: 2015-03-12 — End: 2015-04-09

## 2015-03-12 MED ORDER — TAMSULOSIN HCL 0.4 MG PO CAPS: 0.4000 mg | ORAL_CAPSULE | Freq: Every day | ORAL | Status: DC

## 2015-03-12 MED ORDER — MOMETASONE FURO-FORMOTEROL FUM 100-5 MCG/ACT IN AERO: 1.0000 | INHALATION_SPRAY | Freq: Every day | RESPIRATORY_TRACT | Status: DC

## 2015-03-12 MED ORDER — FAMOTIDINE 20 MG PO TABS: 20.0000 mg | ORAL_TABLET | Freq: Every day | ORAL | Status: DC

## 2015-03-12 MED ORDER — CEPHALEXIN 250 MG PO CAPS: 250.0000 mg | ORAL_CAPSULE | Freq: Every day | ORAL | Status: DC

## 2015-03-12 NOTE — Clinical Social Work Placement (Signed)
   CLINICAL SOCIAL WORK PLACEMENT  NOTE  Date:  03/12/2015  Patient Details  Name: [name] MRN: [id number] Date of Birth: June 13, 1950  Clinical Social Work is seeking post-discharge placement for this patient at the [place] level of care (*CSW will initial, date and re-position this form in  chart as items are completed):  Yes   Patient/family provided with [facility] Work Department's list of facilities offering this level of care within the geographic area requested by the patient (or if unable, by the patient's family).  Yes   Patient/family informed of their freedom to choose among providers that offer the needed level of care, that participate in Medicare, Medicaid or managed care program needed by the patient, have an available bed and are willing to accept the patient.  Yes   Patient/family informed of [facility]'s ownership interest in [facility] and [facility], as well as of the fact that they are under no obligation to receive care at these facilities.  PASRR submitted to EDS on 03/12/15     PASRR number received on 03/12/15     Existing PASRR number confirmed on       FL2 transmitted to all facilities in geographic area requested by pt/family on 03/12/15     FL2 transmitted to all facilities within larger geographic area on       Patient informed that his/her managed care company has contracts with or will negotiate with certain facilities, including the following:        Yes   Patient/family informed of bed offers received.  Patient chooses bed at  ([facility] )     Physician recommends and patient chooses bed at      Patient to be transferred to  ([facility] ) on 03/12/15.  Patient to be transferred to facility by  [facility] )     Patient family notified on 03/12/15 of transfer.  Name of family member notified:   (Pt's husband [name])     PHYSICIAN Please prepare priority discharge  summary, including medications     Additional Comment:    _______________________________________________ Glendon Axe, MSW, LCSWA [phone] 03/12/2015 1:10 PM

## 2015-03-12 NOTE — Progress Notes (Signed)
Subjective: Interval History: has no complaint , to go to [facility].  Objective: Vital signs in last 24 hours: Temp:  [98 F (36.7 C)-98.3 F (36.8 C)] 98.2 F (36.8 C) (01/19 0529) Pulse Rate:  [78-91] 86 (01/19 0529) Resp:  [16-18] 18 (01/19 0529) BP: (127-156)/(50-98) 156/50 mmHg (01/19 0529) SpO2:  [93 %-100 %] 93 % (01/19 0801) Weight:  [59.875 kg (132 lb)] 59.875 kg (132 lb) (01/19 0500) Weight change: -1.497 kg (-3 lb 4.8 oz)  Intake/Output from previous day: 01/18 0701 - 01/19 0700 In: 250  Out: 2100 [Urine:2100] Intake/Output this shift:    General appearance: cooperative, moderately obese, pale and slowed mentation Resp: clear to auscultation bilaterally Cardio: regular rate and rhythm, S1, S2 normal, no murmur, click, rub or gallop GI: soft, non-tender; bowel sounds normal; no masses,  no organomegaly Extremities: extremities normal, atraumatic, no cyanosis or edema  Lab Results:  Recent Labs  03/11/15 0629  WBC 18.0*  HGB 7.7*  HCT 24.8*  PLT 292   BMET:  Recent Labs  03/10/15 1025 03/11/15 0629  NA 145 145  K 3.6 3.4*  CL 112* 114*  CO2 21* 20*  GLUCOSE 221* 127*  BUN 68* 61*  CREATININE 4.07* 3.97*  CALCIUM 9.3 8.5*   No results for input(s): PTH in the last 72 hours. Iron Studies: No results for input(s): IRON, TIBC, TRANSFERRIN, FERRITIN in the last 72 hours.  Studies/Results: No results found.  I have reviewed the patient's current medications.  Assessment/Plan: 1 AKI stable function .  Still signif impairment. If goes to NH,needs chem within 1 wk. .  Mild acidemia.  Hypercalcemic nephropathy 2 Milk Alkali  Needs supervision 3 PVD 4 Dementia 5 Obesity  6 DM controlled P chem with in 1 wk if d/c    LOS: 7 days   Samaria Anes L 03/12/2015,9:23 AM

## 2015-03-12 NOTE — Discharge Summary (Addendum)
Physician Discharge Summary   Patient ID: [name] MRN: [id number] DOB/AGE: Oct 08, 1950 65 y.o.  Admit date: 03/04/2015 Discharge date: 03/12/2015  Primary Care Physician:  [name], MD  Discharge Diagnoses:   . Acute renal failure superimposed on stage 3 chronic kidney disease (HCC)   Severe hypercalcemia  . Chronic obstructive pulmonary disease ([place]) . Anemia, chronic disease . CAD (coronary artery disease) . OBESITY . Acute encephalopathy . Chronic diastolic CHF (congestive heart failure) ([place]) . Essential hypertension . Iron deficiency anemia . Bradycardia  Consults:  Nephrology  Recommendations for Outpatient Follow-up:  1. PT evaluation recommended skilled nursing facility 2. Please repeat CBC/BMET in one week. 3. Please continue Foley catheter, voiding trial on Monday, 03/16/15. If patient has difficulty urinating, strongly recommend urology outpatient workup.   DIET: Heart healthy diet    Allergies:  No Known Allergies   DISCHARGE MEDICATIONS: Current Discharge Medication List    START taking these medications   Details  cephALEXin (KEFLEX) 250 MG capsule Take 1 capsule (250 mg total) by mouth daily. X 3 more days Qty: 3 capsule, Refills: 0    famotidine (PEPCID) 20 MG tablet Take 1 tablet (20 mg total) by mouth daily.    hydrALAZINE (APRESOLINE) 50 MG tablet Take 1 tablet (50 mg total) by mouth every 8 (eight) hours.    insulin aspart (NOVOLOG) 100 UNIT/ML injection Before each meal 3 times a day, 140-199 - 2 units, 200-250 - 4 units, 251-299 - 6 units,  300-349 - 8 units,  350 or above 10 units. Dispense syringes and needles as needed, Ok to switch to PEN if approved. Substitute to any brand approved. DX DM2, Code E11.65 Qty: 1 vial, Refills: 12    mometasone-formoterol (DULERA) 100-5 MCG/ACT AERO Inhale 1 puff into the lungs daily. Qty: 1 Inhaler, Refills: 2    tamsulosin (FLOMAX) 0.4 MG CAPS capsule Take 1 capsule (0.4 mg total) by  mouth daily. Qty: 30 capsule      CONTINUE these medications which have CHANGED   Details  carvedilol (COREG) 3.125 MG tablet Take 1 tablet (3.125 mg total) by mouth 2 (two) times daily with a meal.      CONTINUE these medications which have NOT CHANGED   Details  acetaminophen (TYLENOL) 500 MG tablet Take 500 mg by mouth every 6 (six) hours as needed (pain).     albuterol (PROVENTIL HFA;VENTOLIN HFA) 108 (90 BASE) MCG/ACT inhaler Inhale 2 puffs into the lungs every 6 (six) hours as needed for wheezing or shortness of breath. Qty: 1 Inhaler, Refills: 2   Associated Diagnoses: Chronic obstructive pulmonary disease, unspecified COPD type (HCC)    amLODipine (NORVASC) 10 MG tablet Take 1 tablet (10 mg total) by mouth daily. Qty: 30 tablet, Refills: 2   Associated Diagnoses: Essential hypertension    aspirin EC 81 MG tablet Take 1 tablet (81 mg total) by mouth daily. Qty: 30 tablet, Refills: 2   Associated Diagnoses: Coronary artery disease involving native coronary artery of native heart with unstable angina pectoris (HCC)    atorvastatin (LIPITOR) 80 MG tablet Take 1 tablet (80 mg total) by mouth daily at 6 PM. Qty: 30 tablet, Refills: 2   Associated Diagnoses: Hyperlipidemia    clopidogrel (PLAVIX) 75 MG tablet Take 1 tablet (75 mg total) by mouth daily. Qty: 30 tablet, Refills: 2   Associated Diagnoses: Coronary artery disease involving native coronary artery of native heart with unstable angina pectoris (HCC)    Ferrous Sulfate (IRON) 325 (65 FE)  MG TABS Take 325 mg by mouth 3 (three) times daily. Qty: 90 each, Refills: 2   Associated Diagnoses: Iron deficiency anemia    fish oil-omega-3 fatty acids 1000 MG capsule Take 1 g by mouth 3 (three) times daily. Three times daily    isosorbide mononitrate (IMDUR) 30 MG 24 hr tablet Take 1 tablet (30 mg total) by mouth daily. Qty: 30 tablet, Refills: 2   Associated Diagnoses: Chronic diastolic CHF (congestive heart failure) (HCC)     nitroGLYCERIN (NITROSTAT) 0.4 MG SL tablet Place 1 tablet (0.4 mg total) under the tongue every 5 (five) minutes as needed for chest pain. Qty: 10 tablet, Refills: 0    sertraline (ZOLOFT) 50 MG tablet Take 1 tablet (50 mg total) by mouth daily. Qty: 30 tablet, Refills: 3   Associated Diagnoses: Dysthymic disorder    Fluticasone Furoate-Vilanterol (BREO ELLIPTA) 100-25 MCG/INH AEPB Inhale 1 Inhaler into the lungs daily. Qty: 1 each, Refills: 2   Associated Diagnoses: Chronic obstructive pulmonary disease, unspecified COPD type ([place])    hydrocortisone 2.5 % lotion Apply topically 2 (two) times daily. Qty: 59 mL, Refills: 2   Associated Diagnoses: Pruritic rash      STOP taking these medications     Calcium Carbonate Antacid (TUMS PO)      cloNIDine (CATAPRES) 0.1 MG tablet      furosemide (LASIX) 80 MG tablet          Brief H and P: For complete details please refer to admission H and P, but in brief Patient is a pleasant 65 year old Caucasian female who lives at home with family was brought into the hospital with altered mental status and weakness, this was to be metabolic encephalopathy due to severe hypercalcemia causing dehydration and acute renal failure. Patient was seen by nephrology. She was hydrated with IV fluids, received Lasix, calcitonin along with pamidronate, calcium levels have improved, she has since developed urinary retention with UTI. She was technically discharged on 03/09/2015 but this was held back as on 03/10/2015 she has developed worsening of renal function with bladder retention. Patient was placed on  IV fluid hydration and renal was consulted.  Hospital Course:    Severe metabolic encephalopathy due to severe hypercalcemia with dehydration and acute renal failure.  - Patient was admitted and placed on aggressive IV fluid hydration with Lasix for forced diuresis . She also received calcitonin and pamidronate. - Nephrology was consulted  - Patient had  extensive workup Stable PTH, SPEP showed no M spike, ACE level, Vit D 25 levels & Vit D 1-25 within normal limits - UPEP negative for M spike, PTH RP negative - Called patient's daughter, per daughter, [name], patient had been eating "lot of Tums". Per patient's daughter, her brother had bought the bottle of Tums on Tuesday, 1/10 and she finished almost whole bottle within 24 hours. Per patient's daughter, it was unclear why she was taking that much Tums, may have indigestion, patient did not really complain of any chest pain. - Patient will need labs rechecked in 1 week. No malignancy workup is indicated at this time if there is a reversible cause identified. - Renal ultrasound negative for any hydronephrosis  Leukocytosis, Escherichia coli UTI - Patient was placed on IV Rocephin for Escherichia coli UTI. Transitioned to oral Keflex for 3 more days to complete the full course  GERD - Placed on Pepcid  Hypokalemia, hypomagnesemia  - Replaced  Essential hypertension. - Continue on Norvasc, Imdur, lowered Coreg dose, hold  clonidine as she was bradycardic.   ARF and CK D stage III. - Continue to gently hydrate, creatinine improving   UPT on home oxygen, at baseline, continue supportive care.  Chronic diastolic dysfunction EF [id number] on rest and call. Currently mechanically compensated. Fluid and Lasix due to #1 above.  Mild asymptomatic bradycardia upon admission. - Decreased Coreg dose, discontinue clonidine. Monitor.   Urinary retention with UTI.  - Continue IV Rocephin  DM type II.  - Continue sliding scale insulin    Day of Discharge BP 165/69 mmHg  Pulse 77  Temp(Src) 98 F (36.7 C) (Oral)  Resp 18  Ht 4\' 10"  (1.473 m)  Wt 59.875 kg (132 lb)  BMI 27.60 kg/m2  SpO2 95%  Physical Exam: General: Alert and awake oriented x3 not in any acute distress. HEENT: anicteric sclera, pupils reactive to light and accommodation CVS: S1-S2 clear no murmur rubs or  gallops Chest: clear to auscultation bilaterally, no wheezing rales or rhonchi Abdomen: soft nontender, nondistended, normal bowel sounds Extremities: no cyanosis, clubbing or edema noted bilaterally Neuro: Cranial nerves II-XII intact, no focal neurological deficits   The results of significant diagnostics from this hospitalization (including imaging, microbiology, ancillary and laboratory) are listed below for reference.    LAB RESULTS: Basic Metabolic Panel:  Recent Labs Lab 03/11/15 0629 03/12/15 0927  NA 145 140  K 3.4* 4.4  CL 114* 117*  CO2 20* 17*  GLUCOSE 127* 186*  BUN 61* 51*  CREATININE 3.97* 3.61*  CALCIUM 8.5* 7.9*  MG 1.8  --   PHOS 2.1* 1.7*   Liver Function Tests:  Recent Labs Lab 03/10/15 1025 03/11/15 0629 03/12/15 0927  AST 28 25  --   ALT 14 16  --   ALKPHOS 62 61  --   BILITOT 0.2* 0.3  --   PROT 5.3* 5.2*  --   ALBUMIN 2.2* 2.1* 2.0*   No results for input(s): LIPASE, AMYLASE in the last 168 hours. No results for input(s): AMMONIA in the last 168 hours. CBC:  Recent Labs Lab 03/09/15 0704 03/11/15 0629  WBC 15.0* 18.0*  HGB 9.6* 7.7*  HCT 29.5* 24.8*  MCV 91.0 93.6  PLT 258 292   Cardiac Enzymes: No results for input(s): CKTOTAL, CKMB, CKMBINDEX, TROPONINI in the last 168 hours. BNP: Invalid input(s): POCBNP CBG:  Recent Labs Lab 03/12/15 0638 03/12/15 1138  GLUCAP 107* 144*    Significant Diagnostic Studies:  Dg Chest 2 View  03/04/2015  CLINICAL DATA:  Altered mental status today. Bilateral upper extremity weakness. EXAM: CHEST  2 VIEW COMPARISON:  PA and lateral chest 01/21/2015 and 09/07/2012. CT chest 12/14/2011. FINDINGS: There is cardiomegaly and mild vascular congestion. No consolidative process, pneumothorax or effusion is identified. IMPRESSION: No acute disease. Cardiomegaly and vascular congestion. Electronically Signed   By: Inge Rise M.D.   On: 03/04/2015 10:25   Ct Head Wo Contrast  03/04/2015   CLINICAL DATA:  Altered mental status, weakness, difficulty with ambulation EXAM: CT HEAD WITHOUT CONTRAST TECHNIQUE: Contiguous axial images were obtained from the base of the skull through the vertex without intravenous contrast. COMPARISON:  08/10/2004 FINDINGS: There is no evidence of mass effect, midline shift, or extra-axial fluid collections. There is no evidence of a space-occupying lesion or intracranial hemorrhage. There is no evidence of a cortical-based area of acute infarction. There is generalized cerebral atrophy. There is periventricular white matter low attenuation likely secondary to microangiopathy. The ventricles and sulci are appropriate for the patient's age.  The basal cisterns are patent. Visualized portions of the orbits are unremarkable. The visualized portions of the paranasal sinuses and mastoid air cells are unremarkable. Cerebrovascular atherosclerotic calcifications are noted. The osseous structures are unremarkable. IMPRESSION: 1. No acute intracranial pathology. 2. Chronic microvascular disease and cerebral atrophy. Electronically Signed   By: Kathreen Devoid   On: 03/04/2015 10:36    2D ECHO: Study Conclusions  - Left ventricle: The cavity size was normal. There was mild concentric hypertrophy. Systolic function was normal. The estimated ejection fraction was in the range of 60% to 65%. Wall motion was normal; there were no regional wall motion abnormalities. Features are consistent with a pseudonormal left ventricular filling pattern, with concomitant abnormal relaxation and increased filling pressure (grade 2 diastolic dysfunction). - Mitral valve: Mildly to moderately calcified annulus. There was moderate regurgitation. Valve area by pressure half-time: 1.16 cm^2. Valve area by continuity equation (using LVOT flow): 1.69 cm^2. - Left atrium: The atrium was mildly dilated. - Right atrium: The atrium was mildly dilated. - Pulmonary arteries:  Systolic pressure was mildly increased. PA peak pressure: 40 mm Hg (S).  Disposition and Follow-up: Discharge Instructions    Diet Carb Modified    Complete by:  As directed      Discharge instructions    Complete by:  As directed   Follow with Primary MD [name], MD in 7 days   Get CBC, CMP, ionized calcium, 2 view Chest X ray checked  by Primary MD next visit.    Activity: As tolerated with Full fall precautions use walker/cane & assistance as needed   Disposition CIR/SNF   Diet:   Heart Healthy  with feeding assistance and aspiration precautions.  For Heart failure patients - Check your Weight same time everyday, if you gain over 2 pounds, or you develop in leg swelling, experience more shortness of breath or chest pain, call your Primary MD immediately. Follow Cardiac Low Salt Diet and 1.5 lit/day fluid restriction.   On your next visit with your primary care physician please Get Medicines reviewed and adjusted.   Please request your Prim.MD to go over all Hospital Tests and Procedure/Radiological results at the follow up, please get all Hospital records sent to your Prim MD by signing hospital release before you go home.   If you experience worsening of your admission symptoms, develop shortness of breath, life threatening emergency, suicidal or homicidal thoughts you must seek medical attention immediately by calling 911 or calling your MD immediately  if symptoms less severe.  You Must read complete instructions/literature along with all the possible adverse reactions/side effects for all the Medicines you take and that have been prescribed to you. Take any new Medicines after you have completely understood and accpet all the possible adverse reactions/side effects.   Do not drive, operating heavy machinery, perform activities at heights, swimming or participation in water activities or provide baby sitting services if your were admitted for syncope or siezures  until you have seen by Primary MD or a Neurologist and advised to do so again.  Do not drive when taking Pain medications.    Do not take more than prescribed Pain, Sleep and Anxiety Medications  Special Instructions: If you have smoked or chewed Tobacco  in the last 2 yrs please stop smoking, stop any regular Alcohol  and or any Recreational drug use.  Wear Seat belts while driving.   Please note  You were cared for by a hospitalist during your hospital stay. If  you have any questions about your discharge medications or the care you received while you were in the hospital after you are discharged, you can call the unit and asked to speak with the hospitalist on call if the hospitalist that took care of you is not available. Once you are discharged, your primary care physician will handle any further medical issues. Please note that NO REFILLS for any discharge medications will be authorized once you are discharged, as it is imperative that you return to your primary care physician (or establish a relationship with a primary care physician if you do not have one) for your aftercare needs so that they can reassess your need for medications and monitor your lab values.     Increase activity slowly    Complete by:  As directed      Increase activity slowly    Complete by:  As directed             DISPOSITION: SNF   DISCHARGE FOLLOW-UP Follow-up Information    Follow up with [name], MD. Schedule an appointment as soon as possible for a visit in 1 week.   Specialty:  Family Medicine   Why:  attempted to call for appt.,unable: to contact ,answering service responded voicemail bx full   Contact information:   [address] [place] [address] [phone]       Follow up with Ulla Potash., MD. Schedule an appointment as soon as possible for a visit in 1 week.   Specialty:  Nephrology   Why:  Hypercalcemia, chronic kidney disease stage V   Contact  information:   [place] [id number] [phone]       Follow up On 03/25/2015.   Why:  LYNN scheduled appt with Dr. Prince Rome 03/25/15 at 9:45 am       Time spent on Discharge: 35 mins   Signed:   RAI,RIPUDEEP M.D. [facility] 03/12/2015, 12:35 PM Pager: [phone]

## 2015-03-12 NOTE — Progress Notes (Signed)
Discharge orders received. Pt and family notified. Pt dressed and belongings packed. IV removed. Report called to [facility]. [facility] picked up patient to transport to SNF. Pt's daughter in law called per husband's request.

## 2015-03-12 NOTE — Clinical Social Work Note (Signed)
Clinical Social Worker facilitated patient discharge including contacting patient family and facility to confirm patient discharge plans.  Clinical information faxed to facility and family agreeable with plan.  CSW arranged ambulance transport via [facility] to [facility].  RN to call report prior to discharge.  Clinical Social Worker will sign off for now as social work intervention is no longer needed. Please consult [place] again if new need arises.  Glendon Axe, MSW, LCSWA [phone] 03/12/2015 1:10 PM

## 2015-03-13 ENCOUNTER — Ambulatory Visit: Payer: Self-pay | Admitting: Family Medicine

## 2015-03-19 ENCOUNTER — Encounter (HOSPITAL_COMMUNITY): Payer: Self-pay | Admitting: Emergency Medicine

## 2015-03-19 ENCOUNTER — Inpatient Hospital Stay (HOSPITAL_COMMUNITY)
Admission: EM | Admit: 2015-03-19 | Discharge: 2015-03-26 | DRG: 378 | Disposition: A | Discharge status: Home Health | Payer: Medicaid Other | Attending: Internal Medicine | Admitting: Internal Medicine

## 2015-03-19 DIAGNOSIS — E440 Moderate protein-calorie malnutrition: Secondary | ICD-10-CM | POA: Diagnosis present

## 2015-03-19 DIAGNOSIS — L89152 Pressure ulcer of sacral region, stage 2: Secondary | ICD-10-CM | POA: Diagnosis present

## 2015-03-19 DIAGNOSIS — L98429 Non-pressure chronic ulcer of back with unspecified severity: Secondary | ICD-10-CM | POA: Diagnosis present

## 2015-03-19 DIAGNOSIS — K922 Gastrointestinal hemorrhage, unspecified: Secondary | ICD-10-CM | POA: Diagnosis present

## 2015-03-19 DIAGNOSIS — N184 Chronic kidney disease, stage 4 (severe): Secondary | ICD-10-CM | POA: Diagnosis present

## 2015-03-19 DIAGNOSIS — E872 Acidosis: Secondary | ICD-10-CM | POA: Diagnosis present

## 2015-03-19 DIAGNOSIS — R339 Retention of urine, unspecified: Secondary | ICD-10-CM | POA: Diagnosis present

## 2015-03-19 DIAGNOSIS — Z87891 Personal history of nicotine dependence: Secondary | ICD-10-CM

## 2015-03-19 DIAGNOSIS — K633 Ulcer of intestine: Secondary | ICD-10-CM | POA: Diagnosis present

## 2015-03-19 DIAGNOSIS — J449 Chronic obstructive pulmonary disease, unspecified: Secondary | ICD-10-CM | POA: Diagnosis present

## 2015-03-19 DIAGNOSIS — Q2733 Arteriovenous malformation of digestive system vessel: Secondary | ICD-10-CM

## 2015-03-19 DIAGNOSIS — E1142 Type 2 diabetes mellitus with diabetic polyneuropathy: Secondary | ICD-10-CM | POA: Diagnosis present

## 2015-03-19 DIAGNOSIS — N189 Chronic kidney disease, unspecified: Secondary | ICD-10-CM | POA: Diagnosis present

## 2015-03-19 DIAGNOSIS — E876 Hypokalemia: Secondary | ICD-10-CM | POA: Diagnosis present

## 2015-03-19 DIAGNOSIS — I10 Essential (primary) hypertension: Secondary | ICD-10-CM | POA: Diagnosis present

## 2015-03-19 DIAGNOSIS — E1165 Type 2 diabetes mellitus with hyperglycemia: Secondary | ICD-10-CM | POA: Diagnosis present

## 2015-03-19 DIAGNOSIS — Z7902 Long term (current) use of antithrombotics/antiplatelets: Secondary | ICD-10-CM

## 2015-03-19 DIAGNOSIS — N179 Acute kidney failure, unspecified: Secondary | ICD-10-CM | POA: Diagnosis present

## 2015-03-19 DIAGNOSIS — D62 Acute posthemorrhagic anemia: Secondary | ICD-10-CM | POA: Diagnosis present

## 2015-03-19 DIAGNOSIS — E785 Hyperlipidemia, unspecified: Secondary | ICD-10-CM | POA: Diagnosis present

## 2015-03-19 DIAGNOSIS — I25119 Atherosclerotic heart disease of native coronary artery with unspecified angina pectoris: Secondary | ICD-10-CM | POA: Insufficient documentation

## 2015-03-19 DIAGNOSIS — I701 Atherosclerosis of renal artery: Secondary | ICD-10-CM | POA: Diagnosis present

## 2015-03-19 DIAGNOSIS — F0390 Unspecified dementia without behavioral disturbance: Secondary | ICD-10-CM | POA: Diagnosis present

## 2015-03-19 DIAGNOSIS — E119 Type 2 diabetes mellitus without complications: Secondary | ICD-10-CM

## 2015-03-19 DIAGNOSIS — Z809 Family history of malignant neoplasm, unspecified: Secondary | ICD-10-CM

## 2015-03-19 DIAGNOSIS — E11319 Type 2 diabetes mellitus with unspecified diabetic retinopathy without macular edema: Secondary | ICD-10-CM | POA: Diagnosis present

## 2015-03-19 DIAGNOSIS — J9611 Chronic respiratory failure with hypoxia: Secondary | ICD-10-CM | POA: Diagnosis present

## 2015-03-19 DIAGNOSIS — Z7982 Long term (current) use of aspirin: Secondary | ICD-10-CM

## 2015-03-19 DIAGNOSIS — Z9981 Dependence on supplemental oxygen: Secondary | ICD-10-CM

## 2015-03-19 DIAGNOSIS — I130 Hypertensive heart and chronic kidney disease with heart failure and stage 1 through stage 4 chronic kidney disease, or unspecified chronic kidney disease: Secondary | ICD-10-CM | POA: Diagnosis present

## 2015-03-19 DIAGNOSIS — T39015A Adverse effect of aspirin, initial encounter: Secondary | ICD-10-CM | POA: Diagnosis present

## 2015-03-19 DIAGNOSIS — Z8249 Family history of ischemic heart disease and other diseases of the circulatory system: Secondary | ICD-10-CM

## 2015-03-19 DIAGNOSIS — I2510 Atherosclerotic heart disease of native coronary artery without angina pectoris: Secondary | ICD-10-CM | POA: Diagnosis present

## 2015-03-19 DIAGNOSIS — Z6826 Body mass index (BMI) 26.0-26.9, adult: Secondary | ICD-10-CM

## 2015-03-19 DIAGNOSIS — R0602 Shortness of breath: Secondary | ICD-10-CM

## 2015-03-19 DIAGNOSIS — I5032 Chronic diastolic (congestive) heart failure: Secondary | ICD-10-CM | POA: Diagnosis present

## 2015-03-19 DIAGNOSIS — D649 Anemia, unspecified: Secondary | ICD-10-CM

## 2015-03-19 LAB — COMPREHENSIVE METABOLIC PANEL
ALBUMIN: 2.2 g/dL — AB (ref 3.5–5.0)
ALT: 12 U/L — AB (ref 14–54)
AST: 16 U/L (ref 15–41)
Alkaline Phosphatase: 80 U/L (ref 38–126)
Anion gap: 9 (ref 5–15)
BILIRUBIN TOTAL: 0.4 mg/dL (ref 0.3–1.2)
BUN: 45 mg/dL — AB (ref 6–20)
CHLORIDE: 113 mmol/L — AB (ref 101–111)
CO2: 16 mmol/L — ABNORMAL LOW (ref 22–32)
Calcium: 6.5 mg/dL — ABNORMAL LOW (ref 8.9–10.3)
Creatinine, Ser: 4.15 mg/dL — ABNORMAL HIGH (ref 0.44–1.00)
GFR calc Af Amer: 12 mL/min — ABNORMAL LOW (ref >60)
GFR calc non Af Amer: 10 mL/min — ABNORMAL LOW (ref >60)
GLUCOSE: 151 mg/dL — AB (ref 65–99)
POTASSIUM: 4.4 mmol/L (ref 3.5–5.1)
Sodium: 138 mmol/L (ref 135–145)
Total Protein: 6 g/dL — ABNORMAL LOW (ref 6.5–8.1)

## 2015-03-19 LAB — PREPARE RBC (CROSSMATCH)

## 2015-03-19 LAB — POC OCCULT BLOOD, ED: Fecal Occult Bld: POSITIVE — AB

## 2015-03-19 MED ORDER — SODIUM CHLORIDE 0.9 % IV SOLN
Freq: Once | INTRAVENOUS | Status: AC
  Administered 2015-03-19: via INTRAVENOUS

## 2015-03-19 NOTE — ED Notes (Signed)
Honor Loh, witness, at bedside during fecal occult collection

## 2015-03-19 NOTE — ED Provider Notes (Addendum)
CSN: [id number]     Arrival date & time 03/19/15  1917 History   First MD Initiated Contact with Patient 03/19/15 1937     Chief Complaint  Patient presents with  . Abnormal Lab      HPI Patient recently discharged from the hospitalist service for severe hypercalcemia.  She presents to the emergency department today with abnormal labs drawn at her nursing facility.  They noted hypocalcemia.  Patient is without significant complaints at this time.  She denies chest pain shortness of breath.  Denies abdominal pain.  She states she feels fine.   Past Medical History  Diagnosis Date  . Peripheral vascular disease ([place])     a.s /p left common iliac and external iliac artery stents. b. H/o subclavian, mesenteric, and celiac artery stenosis (see below).  . Diabetes mellitus   . COPD (chronic obstructive pulmonary disease) (HCC)     home oxygen  . Hyperlipidemia   . Depression   . Chronic diastolic CHF (congestive heart failure) ([place])   . Essential hypertension   . Renal artery stenosis ([place])     a. Duplex 2014: no obvious evidence of hemodynamically significant stenosis >60%, bilateral intrarenal arteries exhibit absent diastolic flow. There is evidence of elevated velocities of the mid aorta. 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  [name] CAD (coronary artery disease)   . Iron deficiency anemia   . Diabetic retinopathy ([place])   . 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 Carotid artery disease ([place])     a. Duplex 12/2014: left CEA patent with elevated velocities, 60-79% RICA (stable over serial exams), 123456 LICA stenosis (stable over serial exams), 50% LECA, elevated velocities in bilateral subclavian arteries.  . Hypertensive heart disease with congestive heart failure ([place])   . CKD (chronic kidney disease), stage III   . Tobacco abuse   . Bradycardia      a. Limiting BB titration.  . Anemia, chronic disease 05/16/2008    Qualifier: Diagnosis of  By: Percival Spanish, MD, Farrel Gordon    . Dementia   . Protein-calorie malnutrition ([place]) 01/24/2015   Past Surgical History  Procedure Laterality Date  . Carotid endarterectomy    . Cholecystectomy    . Appendectomy    . Tonsillectomy    . Cesarean section    . Shoulder arthroscopy    . Iliac artery stent      left  . Enteroscopy  01/26/2012    Procedure: ENTEROSCOPY;  Surgeon: Gatha Mayer, MD;  Location: [facility];  Service: Endoscopy;  Laterality: N/A;  . Cardiac catheterization N/A 01/23/2015    Procedure: Left Heart Cath and Coronary Angiography;  Surgeon: [name], MD;  Location: [place] [facility];  Service: Cardiovascular;  Laterality: N/A;   Family History  Problem Relation Age of Onset  . Hypertension Mother   . Heart attack Mother   . Hypertension Father   . Angina Father   . Cancer Sister   . Cancer Brother   . Stroke Neg Hx    Social History  Substance Use Topics  . Smoking status: Former Smoker -- 0.00 packs/day for 36 years    Types: Cigarettes    Quit date: 02/11/2015  . Smokeless tobacco: Never Used     Comment: uses e-cig  . Alcohol Use: No   OB History    No data available  Review of Systems  All other systems reviewed and are negative.     Allergies  Review of patient's allergies indicates no known allergies.  Home Medications   Prior to Admission medications   Medication Sig Start Date End Date Taking? Authorizing Provider  amLODipine (NORVASC) 10 MG tablet Take 1 tablet (10 mg total) by mouth daily. 02/17/15  Yes Arnoldo Morale, MD  aspirin EC 81 MG tablet Take 1 tablet (81 mg total) by mouth daily. 02/17/15  Yes Arnoldo Morale, MD  atorvastatin (LIPITOR) 80 MG tablet Take 1 tablet (80 mg total) by mouth daily at 6 PM. 02/17/15  Yes Arnoldo Morale, MD  carvedilol (COREG) 3.125 MG tablet Take 1 tablet (3.125 mg total) by mouth 2 (two)  times daily with a meal. 03/12/15  Yes Ripudeep K Rai, MD  clopidogrel (PLAVIX) 75 MG tablet Take 1 tablet (75 mg total) by mouth daily. 02/17/15  Yes Arnoldo Morale, MD  famotidine (PEPCID) 20 MG tablet Take 1 tablet (20 mg total) by mouth daily. 03/12/15  Yes Ripudeep Krystal Eaton, MD  Ferrous Sulfate (IRON) 325 (65 FE) MG TABS Take 325 mg by mouth 3 (three) times daily. 02/17/15  Yes Arnoldo Morale, MD  fish oil-omega-3 fatty acids 1000 MG capsule Take 1 g by mouth 3 (three) times daily. Three times daily   Yes Historical Provider, MD  Fluticasone Furoate-Vilanterol (BREO ELLIPTA) 100-25 MCG/INH AEPB Inhale 1 Inhaler into the lungs daily. 02/17/15  Yes Arnoldo Morale, MD  hydrALAZINE (APRESOLINE) 50 MG tablet Take 1 tablet (50 mg total) by mouth every 8 (eight) hours. 03/09/15  Yes Thurnell Lose, MD  hydrocortisone 2.5 % lotion Apply topically 2 (two) times daily. 01/08/15  Yes Dorena Dew, FNP  insulin aspart (NOVOLOG) 100 UNIT/ML injection Before each meal 3 times a day, 140-199 - 2 units, 200-250 - 4 units, 251-299 - 6 units,  300-349 - 8 units,  350 or above 10 units. Dispense syringes and needles as needed, Ok to switch to PEN if approved. Substitute to any brand approved. DX DM2, Code E11.65 03/09/15  Yes Thurnell Lose, MD  isosorbide mononitrate (IMDUR) 30 MG 24 hr tablet Take 1 tablet (30 mg total) by mouth daily. 02/17/15  Yes Arnoldo Morale, MD  mometasone-formoterol (DULERA) 100-5 MCG/ACT AERO Inhale 1 puff into the lungs daily. 03/12/15  Yes Ripudeep Krystal Eaton, MD  sertraline (ZOLOFT) 50 MG tablet Take 1 tablet (50 mg total) by mouth daily. 02/04/15  Yes Arnoldo Morale, MD  tamsulosin (FLOMAX) 0.4 MG CAPS capsule Take 1 capsule (0.4 mg total) by mouth daily. 03/12/15  Yes Ripudeep Krystal Eaton, MD  albuterol (PROVENTIL HFA;VENTOLIN HFA) 108 (90 BASE) MCG/ACT inhaler Inhale 2 puffs into the lungs every 6 (six) hours as needed for wheezing or shortness of breath. 02/17/15   Arnoldo Morale, MD  cephALEXin (KEFLEX)  250 MG capsule Take 1 capsule (250 mg total) by mouth daily. X 3 more days Patient not taking: Reported on 03/19/2015 03/12/15   Ripudeep Krystal Eaton, MD  nitroGLYCERIN (NITROSTAT) 0.4 MG SL tablet Place 1 tablet (0.4 mg total) under the tongue every 5 (five) minutes as needed for chest pain. 01/28/15   Delfina Redwood, MD   BP 124/49 mmHg  Pulse 71  Temp(Src) 98.3 F (36.8 C) (Oral)  Resp 18  Ht 4\' 10"  (1.473 m)  Wt 135 lb (61.236 kg)  BMI 28.22 kg/m2  SpO2  Physical Exam  Constitutional: She is oriented to person, place, and time. She  appears well-developed and well-nourished. No distress.  HENT:  Head: Normocephalic and atraumatic.  Eyes: EOM are normal.  Neck: Normal range of motion.  Cardiovascular: Normal rate, regular rhythm and normal heart sounds.   Pulmonary/Chest: Effort normal and breath sounds normal.  Abdominal: Soft. She exhibits no distension. There is no tenderness.  Musculoskeletal: Normal range of motion.  Neurological: She is alert and oriented to person, place, and time.  Skin: Skin is warm and dry.  Psychiatric: She has a normal mood and affect. Judgment normal.  Nursing note and vitals reviewed.   ED Course  Procedures (including critical care time)  CRITICAL CARE Performed by: Hoy Morn Total critical care time: 31 minutes Critical care time was exclusive of separately billable procedures and treating other patients. Critical care was necessary to treat or prevent imminent or life-threatening deterioration. Critical care was time spent personally by me on the following activities: development of treatment plan with patient and/or surrogate as well as nursing, discussions with consultants, evaluation of patient's response to treatment, examination of patient, obtaining history from patient or surrogate, ordering and performing treatments and interventions, ordering and review of laboratory studies, ordering and review of radiographic studies, pulse oximetry  and re-evaluation of patient's condition.  Labs Review Labs Reviewed  CBC - Abnormal; Notable for the following:    WBC 11.5 (*)    RBC 2.15 (*)    Hemoglobin 6.4 (*)    HCT 19.7 (*)    Platelets 430 (*)    All other components within normal limits  COMPREHENSIVE METABOLIC PANEL - Abnormal; Notable for the following:    Chloride 113 (*)    CO2 16 (*)    Glucose, Bld 151 (*)    BUN 45 (*)    Creatinine, Ser 4.15 (*)    Calcium 6.5 (*)    Total Protein 6.0 (*)    Albumin 2.2 (*)    ALT 12 (*)    GFR calc non Af Amer 10 (*)    GFR calc Af Amer 12 (*)    All other components within normal limits  POC OCCULT BLOOD, ED - Abnormal; Notable for the following:    Fecal Occult Bld POSITIVE (*)    All other components within normal limits  TYPE AND SCREEN  PREPARE RBC (CROSSMATCH)   BUN  Date Value Ref Range Status  03/19/2015 45* 6 - 20 mg/dL Final  03/12/2015 51* 6 - 20 mg/dL Final  03/11/2015 61* 6 - 20 mg/dL Final  03/10/2015 68* 6 - 20 mg/dL Final  12/27/2011 60.0* 7.0 - 26.0 mg/dL Final  11/28/2011 51.0* 7.0 - 26.0 mg/dL Final   CREATININE  Date Value Ref Range Status  09/09/2012 4.77* 0.50 - 1.10 mg/dL Final  12/27/2011 1.5* 0.6 - 1.1 mg/dL Final  11/28/2011 1.3* 0.6 - 1.1 mg/dL Final   CREAT  Date Value Ref Range Status  02/17/2015 1.47* 0.50 - 0.99 mg/dL Final  02/04/2015 1.33* 0.50 - 0.99 mg/dL Final  01/08/2015 1.32* 0.50 - 0.99 mg/dL Final  01/02/2015 1.26* 0.50 - 0.99 mg/dL Final   CREATININE, SER  Date Value Ref Range Status  03/19/2015 4.15* 0.44 - 1.00 mg/dL Final  03/12/2015 3.61* 0.44 - 1.00 mg/dL Final  03/11/2015 3.97* 0.44 - 1.00 mg/dL Final  03/10/2015 4.07* 0.44 - 1.00 mg/dL Final     HEMOGLOBIN  Date Value Ref Range Status  03/19/2015 6.4* 12.0 - 15.0 g/dL Final    Comment:    REPEATED TO VERIFY CRITICAL RESULT CALLED  TO, READ BACK BY AND VERIFIED WITH: LAPROSHA JEFFRIES RN 1.26.16 @ 2047 BY RICEJ   03/11/2015 7.7* 12.0 - 15.0 g/dL  Final  03/09/2015 9.6* 12.0 - 15.0 g/dL Final  03/08/2015 9.3* 12.0 - 15.0 g/dL Final   HGB  Date Value Ref Range Status  09/27/2012 10.9* 11.6 - 15.9 g/dL Final  06/26/2012 10.4* 11.6 - 15.9 g/dL Final  03/07/2012 10.7* 11.6 - 15.9 g/dL Final  12/27/2011 6.2* 11.6 - 15.9 g/dL Final      Imaging Review No results found. I have personally reviewed and evaluated these images and lab results as part of my medical decision-making.   EKG Interpretation None      MDM   Final diagnoses:  None    Patient with hypocalcemia today also with worsening anemia.  Patient has a history of AVMs.  Likely bleeding from one of these.  She is asymptomatic.  However given the dropping hemoglobin I think she'll benefit from blood transfusion.    Jola Schmidt, MD 03/19/15 [place], MD 03/19/15 2249

## 2015-03-19 NOTE — ED Notes (Signed)
Hospitalist at bedside 

## 2015-03-19 NOTE — ED Notes (Signed)
Bed: WA07 Expected date:  Expected time:  Means of arrival:  Comments: EMS 

## 2015-03-19 NOTE — H&P (Signed)
[facility] History and Physical  LOYD LAMOTTE [id number] DOB: Feb 08, 1951 DOA: 03/19/2015  Referring physician: Jola Schmidt, MD PCP: Pcp Not In System   Chief Complaint: Anemia.  HPI: [name] is a 65 y.o. female with a past medica history of diabetes mellitus, diabetic peripheral neuropathy, chronic kidney disease, COPD on home oxygen, hyperlipidemia, depression, CAD, chronic diastolic CHF, peripheral vascular disease, essential hypertension who was sent from a skilled nursing facility to the emergency department for evaluation of hypocalcemia 6.4 mg/dL. Redrawn level in the ED was 6.5 mg/dL and Corrected calcium level to a 2.2 g/dL albumin level was 7.9 mg/dL. It should be noted, that the patient was treated for hypercalcemia during her hospitalization earlier this month. However, her hemoglobin level dropped from 9.6  to 6.4 g/dL today. Her creatinine level has also worsened slightly from 3. 61 week ago to 4.15 mg/dL today.  Per patient, and she was sent here because of an abnormal lab result. She denies headache, sore throat, chest pain, worsening of dyspnea,  Diaphoresis, abdominal pain, nausea, emesis, diarrhea or constipation. She thinks that her stool may have been darker a few days ago, but is not totally sure. A few days ago she had mild dizziness and palpitations.  When seen in the emergency department, she was in no acute distress.  The patient is not a very detailed historian.  Review of Systems:  Constitutional:  Positive fatigue.  No weight loss, night sweats, Fevers, chills HEENT:  No headaches, Difficulty swallowing,Tooth/dental problems,Sore throat,  No sneezing, itching, ear ache, nasal congestion, post nasal drip,  Cardio-vascular:  mild dizziness, palpitations a few days ago No chest pain, Orthopnea, PND, swelling in lower extremities, anasarca,  GI:  Not sure if she has had melena, but thinks that stools may have been darker a few days ago. No  heartburn, indigestion, abdominal pain, nausea, vomiting, diarrhea, change in bowel habits, loss of appetite  Resp:  No shortness of breath with exertion or at rest. No excess mucus, no productive cough, No non-productive cough, No coughing up of blood.No change in color of mucus.No wheezing.No chest wall deformity  Skin:  no rash or lesions.  GU:  no dysuria, change in color of urine, no urgency or frequency. No flank pain.  Musculoskeletal:  No joint pain or swelling. No decreased range of motion. No back pain.  Psych:  History of depression or anxiety.  No change in mood or affect. No memory loss.   Past Medical History  Diagnosis Date  . Peripheral vascular disease ([place])     a.s /p left common iliac and external iliac artery stents. b. H/o subclavian, mesenteric, and celiac artery stenosis (see below).  . Diabetes mellitus   . COPD (chronic obstructive pulmonary disease) (HCC)     home oxygen  . Hyperlipidemia   . Depression   . Chronic diastolic CHF (congestive heart failure) ([place])   . Essential hypertension   . Renal artery stenosis ([place])     a. Duplex 2014: no obvious evidence of hemodynamically significant stenosis >60%, bilateral intrarenal arteries exhibit absent diastolic flow. There is evidence of elevated velocities of the mid aorta. 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  [name] CAD (coronary artery disease)   . Iron deficiency anemia   . Diabetic retinopathy ([place])   . 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 Carotid  artery disease ([place])     a. Duplex 12/2014: left CEA patent with elevated velocities, 60-79% RICA (stable over serial exams), 123456 LICA stenosis (stable over serial exams), 50% LECA, elevated velocities in bilateral subclavian arteries.  . Hypertensive heart disease with congestive heart failure ([place])   . CKD (chronic  kidney disease), stage III   . Tobacco abuse   . Bradycardia     a. Limiting BB titration.  . Anemia, chronic disease 05/16/2008    Qualifier: Diagnosis of  By: Percival Spanish, MD, Farrel Gordon    . Dementia   . Protein-calorie malnutrition ([place]) 01/24/2015   Past Surgical History  Procedure Laterality Date  . Carotid endarterectomy    . Cholecystectomy    . Appendectomy    . Tonsillectomy    . Cesarean section    . Shoulder arthroscopy    . Iliac artery stent      left  . Enteroscopy  01/26/2012    Procedure: ENTEROSCOPY;  Surgeon: Gatha Mayer, MD;  Location: [facility];  Service: Endoscopy;  Laterality: N/A;  . Cardiac catheterization N/A 01/23/2015    Procedure: Left Heart Cath and Coronary Angiography;  Surgeon: [name], MD;  Location: [facility];  Service: Cardiovascular;  Laterality: N/A;   Social History:  reports that she quit smoking about 5 weeks ago. Her smoking use included Cigarettes. She smoked 0.00 packs per day for 36 years. She has never used smokeless tobacco. She reports that she does not drink alcohol or use illicit drugs.  No Known Allergies  Family History  Problem Relation Age of Onset  . Hypertension Mother   . Heart attack Mother   . Hypertension Father   . Angina Father   . Cancer Sister   . Cancer Brother   . Stroke Neg Hx      Prior to Admission medications   Medication Sig Start Date End Date Taking? Authorizing Provider  amLODipine (NORVASC) 10 MG tablet Take 1 tablet (10 mg total) by mouth daily. 02/17/15  Yes Arnoldo Morale, MD  aspirin EC 81 MG tablet Take 1 tablet (81 mg total) by mouth daily. 02/17/15  Yes Arnoldo Morale, MD  atorvastatin (LIPITOR) 80 MG tablet Take 1 tablet (80 mg total) by mouth daily at 6 PM. 02/17/15  Yes Arnoldo Morale, MD  carvedilol (COREG) 3.125 MG tablet Take 1 tablet (3.125 mg total) by mouth 2 (two) times daily with a meal. 03/12/15  Yes Ripudeep K Rai, MD  clopidogrel (PLAVIX) 75 MG tablet Take 1  tablet (75 mg total) by mouth daily. 02/17/15  Yes Arnoldo Morale, MD  famotidine (PEPCID) 20 MG tablet Take 1 tablet (20 mg total) by mouth daily. 03/12/15  Yes Ripudeep Krystal Eaton, MD  Ferrous Sulfate (IRON) 325 (65 FE) MG TABS Take 325 mg by mouth 3 (three) times daily. 02/17/15  Yes Arnoldo Morale, MD  fish oil-omega-3 fatty acids 1000 MG capsule Take 1 g by mouth 3 (three) times daily. Three times daily   Yes Historical Provider, MD  Fluticasone Furoate-Vilanterol (BREO ELLIPTA) 100-25 MCG/INH AEPB Inhale 1 Inhaler into the lungs daily. 02/17/15  Yes Arnoldo Morale, MD  hydrALAZINE (APRESOLINE) 50 MG tablet Take 1 tablet (50 mg total) by mouth every 8 (eight) hours. 03/09/15  Yes Thurnell Lose, MD  hydrocortisone 2.5 % lotion Apply topically 2 (two) times daily. 01/08/15  Yes Dorena Dew, FNP  insulin aspart (NOVOLOG) 100 UNIT/ML injection Before each meal 3 times a day, 140-199 -  2 units, 200-250 - 4 units, 251-299 - 6 units,  300-349 - 8 units,  350 or above 10 units. Dispense syringes and needles as needed, Ok to switch to PEN if approved. Substitute to any brand approved. DX DM2, Code E11.65 03/09/15  Yes Thurnell Lose, MD  isosorbide mononitrate (IMDUR) 30 MG 24 hr tablet Take 1 tablet (30 mg total) by mouth daily. 02/17/15  Yes Arnoldo Morale, MD  mometasone-formoterol (DULERA) 100-5 MCG/ACT AERO Inhale 1 puff into the lungs daily. 03/12/15  Yes Ripudeep Krystal Eaton, MD  sertraline (ZOLOFT) 50 MG tablet Take 1 tablet (50 mg total) by mouth daily. 02/04/15  Yes Arnoldo Morale, MD  tamsulosin (FLOMAX) 0.4 MG CAPS capsule Take 1 capsule (0.4 mg total) by mouth daily. 03/12/15  Yes Ripudeep Krystal Eaton, MD  albuterol (PROVENTIL HFA;VENTOLIN HFA) 108 (90 BASE) MCG/ACT inhaler Inhale 2 puffs into the lungs every 6 (six) hours as needed for wheezing or shortness of breath. 02/17/15   Arnoldo Morale, MD  cephALEXin (KEFLEX) 250 MG capsule Take 1 capsule (250 mg total) by mouth daily. X 3 more days Patient not taking:  Reported on 03/19/2015 03/12/15   Ripudeep Krystal Eaton, MD  nitroGLYCERIN (NITROSTAT) 0.4 MG SL tablet Place 1 tablet (0.4 mg total) under the tongue every 5 (five) minutes as needed for chest pain. 01/28/15   Delfina Redwood, MD   Physical Exam: Filed Vitals:   03/19/15 1930 03/19/15 1934 03/19/15 2140  BP: 124/49  119/47  Pulse: 71  77  Temp: 98.3 F (36.8 C)  98.1 F (36.7 C)  TempSrc: Oral  Oral  Resp: 18  18  Height:  4\' 10"  (1.473 m)   Weight:  61.236 kg (135 lb)   SpO2:   100%    Wt Readings from Last 3 Encounters:  03/19/15 61.236 kg (135 lb)  03/12/15 59.875 kg (132 lb)  02/20/15 55.067 kg (121 lb 6.4 oz)    General:  Appears calm and comfortable Eyes: PERRL, normal lids, irises & conjunctiva ENT: grossly normal hearing, lips & tongue, dentition absent or in poor state of repair. Neck: no LAD, masses or thyromegaly Cardiovascular: RRR, positive systolic murmur, m/r/g. No LE edema. Telemetry: SR, no arrhythmias  Respiratory: CTA bilaterally, no w/r/r. Normal respiratory effort. Abdomen: BS positive, soft, ntnd Skin: no rash or induration seen on limited exam Musculoskeletal: grossly normal tone BUE/BLE Psychiatric: grossly normal mood and affect, speech fluent and appropriate Neurologic: Awake, alert, oriented 3, grossly non-focal.          Labs on Admission:  Basic Metabolic Panel:  Recent Labs Lab 03/19/15 2018  NA 138  K 4.4  CL 113*  CO2 16*  GLUCOSE 151*  BUN 45*  CREATININE 4.15*  CALCIUM 6.5*   Liver Function Tests:  Recent Labs Lab 03/19/15 2018  AST 16  ALT 12*  ALKPHOS 80  BILITOT 0.4  PROT 6.0*  ALBUMIN 2.2*   CBC:  Recent Labs Lab 03/19/15 2018  WBC 11.5*  HGB 6.4*  HCT 19.7*  MCV 91.6  PLT 430*    BNP (last 3 results)  Recent Labs  11/07/14 0325 12/24/14 1030 01/21/15 2041  BNP 2634.2* 1288.9* 833.4*    ECHOCARDIOGRAM: 01/24/2015.   ------------------------------------------------------------------- LV EF: 60%  -  65%  ------------------------------------------------------------------- Indications:   CHF - 428.0.  ------------------------------------------------------------------- History:  PMH:  Chest pain. Coronary artery disease. Risk factors: Current tobacco use. Hypertension.  ------------------------------------------------------------------- Study Conclusions  - Left ventricle: The cavity size was  normal. There was mild concentric hypertrophy. Systolic function was normal. The estimated ejection fraction was in the range of 60% to 65%. Wall motion was normal; there were no regional wall motion abnormalities. Features are consistent with a pseudonormal left ventricular filling pattern, with concomitant abnormal relaxation and increased filling pressure (grade 2 diastolic dysfunction). - Mitral valve: Mildly to moderately calcified annulus. There was moderate regurgitation. Valve area by pressure half-time: 1.16 cm^2. Valve area by continuity equation (using LVOT flow): 1.69 cm^2. - Left atrium: The atrium was mildly dilated. - Right atrium: The atrium was mildly dilated. - Pulmonary arteries: Systolic pressure was mildly increased. PA peak pressure: 40 mm Hg (S).  Assessment/Plan Principal Problem:   Anemia Admit to telemetry. Transfuse 2 units of packed RBCs. Check CBC in the morning.  Active Problems:   Chronic/recurrent GI bleed The patient has a history of AVM and has had extensive workup by GI in the past.  Hold aspirin and clopidogrel. Consider GI consult in a.m. for outpatient follow-up with GI. Will keep nothing by mouth until a.m.    AKI (acute kidney injury) ([place])   CKD (chronic kidney disease) Follow-up electrolytes, BUN and creatinine in the morning after blood transfusion. Consider expanding workup and nephrology evaluation if renal function worsens.    Hypocalcemia Corrected to albumin, her calcium level is 7.9 mg/dL. She was  treated recently for hypercalcemia. She does not have any signs of symptoms of hypocalcemia. Recheck level in a.m. Check PTH, magnesium, phosphorus and vitamin D metabolites.    Diabetes mellitus (HCC) Stable. CBG monitoring every 6 hours while nothing by mouth.    Chronic diastolic CHF (congestive heart failure) (HCC) Resume hydralazine and isosorbide mononitrate in AM if the patient decides to follow up with GI as an outpatient.    Essential hypertension Monitor blood pressure closely. Resume antihypertensive therapy when appropriate       Code Status: Full code. DVT Prophylaxis: SCDs. Family Communication: Disposition Plan: Admit for blood transfusion.   Time spent: Over 70 minutes were used during the process of this admission.  Reubin Milan, M.D. [facility] Pager [phone].

## 2015-03-19 NOTE — ED Notes (Signed)
Per EMS, lab work was done at nursing home & calcium was 6.4. Nursing facility sent her here.  Patient complaining of pain on a pressure ulcer on her buttocks region. Patient is from [place].

## 2015-03-20 DIAGNOSIS — R195 Other fecal abnormalities: Secondary | ICD-10-CM

## 2015-03-20 DIAGNOSIS — D62 Acute posthemorrhagic anemia: Secondary | ICD-10-CM

## 2015-03-20 LAB — MAGNESIUM: MAGNESIUM: 1.7 mg/dL (ref 1.7–2.4)

## 2015-03-20 LAB — GLUCOSE, CAPILLARY
GLUCOSE-CAPILLARY: 135 mg/dL — AB (ref 65–99)
Glucose-Capillary: 131 mg/dL — ABNORMAL HIGH (ref 65–99)
Glucose-Capillary: 77 mg/dL (ref 65–99)

## 2015-03-20 LAB — COMPREHENSIVE METABOLIC PANEL
ALBUMIN: 2.4 g/dL — AB (ref 3.5–5.0)
ALT: 12 U/L — AB (ref 14–54)
AST: 17 U/L (ref 15–41)
Alkaline Phosphatase: 85 U/L (ref 38–126)
Anion gap: 13 (ref 5–15)
BUN: 48 mg/dL — AB (ref 6–20)
CHLORIDE: 111 mmol/L (ref 101–111)
CO2: 14 mmol/L — AB (ref 22–32)
CREATININE: 4.13 mg/dL — AB (ref 0.44–1.00)
Calcium: 6.6 mg/dL — ABNORMAL LOW (ref 8.9–10.3)
GFR calc Af Amer: 12 mL/min — ABNORMAL LOW (ref >60)
GFR calc non Af Amer: 10 mL/min — ABNORMAL LOW (ref >60)
Glucose, Bld: 107 mg/dL — ABNORMAL HIGH (ref 65–99)
POTASSIUM: 4 mmol/L (ref 3.5–5.1)
SODIUM: 138 mmol/L (ref 135–145)
Total Bilirubin: 0.4 mg/dL (ref 0.3–1.2)
Total Protein: 6.4 g/dL — ABNORMAL LOW (ref 6.5–8.1)

## 2015-03-20 LAB — CBC
HEMATOCRIT: 19.7 % — AB (ref 36.0–46.0)
HEMATOCRIT: 30 % — AB (ref 36.0–46.0)
HEMOGLOBIN: 9.8 g/dL — AB (ref 12.0–15.0)
Hemoglobin: 6.4 g/dL — CL (ref 12.0–15.0)
MCH: 29.8 pg (ref 26.0–34.0)
MCH: 30.1 pg (ref 26.0–34.0)
MCHC: 32.5 g/dL (ref 30.0–36.0)
MCHC: 32.7 g/dL (ref 30.0–36.0)
MCV: 91.6 fL (ref 78.0–100.0)
MCV: 92 fL (ref 78.0–100.0)
PLATELETS: 430 10*3/uL — AB (ref 150–400)
Platelets: 390 10*3/uL (ref 150–400)
RBC: 2.15 MIL/uL — AB (ref 3.87–5.11)
RBC: 3.26 MIL/uL — ABNORMAL LOW (ref 3.87–5.11)
RDW: 14.6 % (ref 11.5–15.5)
RDW: 14.8 % (ref 11.5–15.5)
WBC: 11.5 10*3/uL — AB (ref 4.0–10.5)
WBC: 14 10*3/uL — ABNORMAL HIGH (ref 4.0–10.5)

## 2015-03-20 LAB — PHOSPHORUS: PHOSPHORUS: 2.9 mg/dL (ref 2.5–4.6)

## 2015-03-20 LAB — PREPARE RBC (CROSSMATCH)

## 2015-03-20 MED ORDER — ALBUTEROL SULFATE (2.5 MG/3ML) 0.083% IN NEBU: 2.5000 mg | INHALATION_SOLUTION | RESPIRATORY_TRACT | Status: DC | PRN

## 2015-03-20 MED ORDER — ACETAMINOPHEN 325 MG PO TABS
650.0000 mg | ORAL_TABLET | Freq: Four times a day (QID) | ORAL | Status: DC | PRN
  Administered 2015-03-20 – 2015-03-24 (×3): 650 mg via ORAL
  Filled 2015-03-20 (×3): qty 2

## 2015-03-20 MED ORDER — ATORVASTATIN CALCIUM 40 MG PO TABS
80.0000 mg | ORAL_TABLET | Freq: Every day | ORAL | Status: DC
  Administered 2015-03-20 – 2015-03-25 (×6): 80 mg via ORAL
  Filled 2015-03-20 (×6): qty 2

## 2015-03-20 MED ORDER — ONDANSETRON HCL 4 MG/2ML IJ SOLN: 4.0000 mg | Freq: Four times a day (QID) | INTRAMUSCULAR | Status: DC | PRN

## 2015-03-20 MED ORDER — SERTRALINE HCL 50 MG PO TABS
50.0000 mg | ORAL_TABLET | Freq: Every day | ORAL | Status: DC
  Administered 2015-03-20 – 2015-03-26 (×6): 50 mg via ORAL
  Filled 2015-03-20 (×6): qty 1

## 2015-03-20 MED ORDER — SODIUM CHLORIDE 0.9 % IV SOLN
Freq: Once | INTRAVENOUS | Status: AC
  Administered 2015-03-20: 06:00:00 via INTRAVENOUS

## 2015-03-20 MED ORDER — ACETAMINOPHEN 650 MG RE SUPP: 650.0000 mg | Freq: Four times a day (QID) | RECTAL | Status: DC | PRN

## 2015-03-20 MED ORDER — TAMSULOSIN HCL 0.4 MG PO CAPS
0.4000 mg | ORAL_CAPSULE | Freq: Every day | ORAL | Status: DC
  Administered 2015-03-20 – 2015-03-26 (×6): 0.4 mg via ORAL
  Filled 2015-03-20 (×6): qty 1

## 2015-03-20 MED ORDER — CARVEDILOL 3.125 MG PO TABS
3.1250 mg | ORAL_TABLET | Freq: Two times a day (BID) | ORAL | Status: DC
  Administered 2015-03-20 – 2015-03-26 (×13): 3.125 mg via ORAL
  Filled 2015-03-20 (×13): qty 1

## 2015-03-20 MED ORDER — ISOSORBIDE MONONITRATE ER 30 MG PO TB24
30.0000 mg | ORAL_TABLET | Freq: Every day | ORAL | Status: DC
  Administered 2015-03-20 – 2015-03-26 (×6): 30 mg via ORAL
  Filled 2015-03-20 (×6): qty 1

## 2015-03-20 MED ORDER — PANTOPRAZOLE SODIUM 40 MG IV SOLR
40.0000 mg | Freq: Two times a day (BID) | INTRAVENOUS | Status: DC
  Administered 2015-03-20 – 2015-03-26 (×12): 40 mg via INTRAVENOUS
  Filled 2015-03-20 (×12): qty 40

## 2015-03-20 MED ORDER — ONDANSETRON HCL 4 MG PO TABS: 4.0000 mg | ORAL_TABLET | Freq: Four times a day (QID) | ORAL | Status: DC | PRN

## 2015-03-20 MED ORDER — MOMETASONE FURO-FORMOTEROL FUM 100-5 MCG/ACT IN AERO
2.0000 | INHALATION_SPRAY | Freq: Two times a day (BID) | RESPIRATORY_TRACT | Status: DC
Start: 2015-03-20 — End: 2015-03-26
  Administered 2015-03-20 – 2015-03-26 (×12): 2 via RESPIRATORY_TRACT
  Filled 2015-03-20: qty 8.8

## 2015-03-20 MED ORDER — SODIUM CHLORIDE 0.9% FLUSH
3.0000 mL | Freq: Two times a day (BID) | INTRAVENOUS | Status: DC
  Administered 2015-03-20 – 2015-03-26 (×12): 3 mL via INTRAVENOUS

## 2015-03-20 NOTE — NC FL2 (Signed)
[employer] LEVEL OF CARE SCREENING TOOL     IDENTIFICATION  Patient Name: [name] Birthdate: 12-12-1950 Sex: female Admission Date (Current Location): 03/19/2015  [facility] and [place] Number:  Herbalist and Address:  [facility],  [address] [place], [place]      Provider Number: [id number]  Attending Physician Name and Address:  Domenic Polite, MD  Relative Name and Phone Number:  [name] Daughter [phone] or [phone]    Current Level of Care: Hospital Recommended Level of Care: [place] Prior Approval Number:    Date Approved/Denied:   PASRR Number: [id number]  Discharge Plan: SNF    Current Diagnoses: Patient Active Problem List   Diagnosis Date Noted  . Hypocalcemia 03/20/2015  . Anemia 03/19/2015  . Pressure ulcer 03/11/2015  . Lewy body dementia without behavioral disturbance   . CKD (chronic kidney disease)   . Hypernatremia   . Hypokalemia   . Leukocytosis   . Pyrexia   . Acute respiratory failure with hypoxia ([place]) 03/04/2015  . Acute encephalopathy 03/04/2015  . Acute renal failure superimposed on stage 3 chronic kidney disease ([place]) 03/04/2015  . Generalized weakness 03/04/2015  . Altered mental status   . AKI (acute kidney injury) ([place])   . Protein-calorie malnutrition ([place]) 01/24/2015  . Acute on chronic diastolic heart failure ([place])   . Abnormal nuclear stress test   . Acute chest pain 01/21/2015  . Scabies 01/10/2015  . Proteinuria 01/10/2015  . CKD (chronic kidney disease), stage III   . Hypertensive heart disease with congestive heart failure ([place])   . Carotid artery disease ([place])   . CAD (coronary artery disease)   . Chronic diastolic CHF (congestive heart failure) ([place])   . Essential hypertension   . Tobacco abuse   . Bradycardia   . CHF (congestive heart failure) ([place]) 12/24/2014  . On home oxygen therapy 11/10/2014  . Acute on chronic diastolic CHF  (congestive heart failure) ([place]) 11/07/2014  . Gastric angiodysplasia 01/26/2012  . 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 Diabetes mellitus ([place]) 01/25/2012  . Iron deficiency anemia 01/25/2012  . Acute blood loss anemia 11/03/2011  . AVM (arteriovenous malformation) 11/03/2011  . GI bleed 11/03/2011  . Diarrhea 11/03/2011  . Renal artery stenosis ([place]) 11/19/2010  . Peripheral vascular disease ([place]) 11/19/2010  . HYPOGLYCEMIA 05/16/2008  . Hyperlipidemia 05/16/2008  . OBESITY 05/16/2008  . Anemia, chronic disease 05/16/2008  . ANXIETY DEPRESSION 05/16/2008  . DEPRESSION 05/16/2008  . TIA 05/16/2008  . History of CVA (cerebrovascular accident) 05/16/2008  . Chronic obstructive pulmonary disease ([place]) 05/16/2008  . DIVERTICULITIS, HX OF 05/16/2008    Orientation RESPIRATION BLADDER Height & Weight    Self, Time, Situation, Place  O2 (2L) Incontinent 4\' 10"  (147.3 cm) 129 lbs.  BEHAVIORAL SYMPTOMS/MOOD NEUROLOGICAL BOWEL NUTRITION STATUS   (n/a)  (n/a) Continent Diet (Diet Heart)  AMBULATORY STATUS COMMUNICATION OF NEEDS Skin   Limited Assist Verbally PU Stage and Appropriate Care (Stage 1 to Sacrum) PU Stage 1 Dressing: Daily (Foam Dressing changes PRN)                     Personal Care Assistance Level of Assistance  Bathing, Dressing, Feeding Bathing Assistance: Limited assistance Feeding assistance: Independent Dressing Assistance: Limited assistance     Functional Limitations Info  Sight, Hearing, Speech Sight Info: Impaired Hearing Info: Adequate Speech Info: Adequate    SPECIAL CARE FACTORS  FREQUENCY  PT (By licensed PT), OT (By licensed OT)     PT Frequency: 5 x a week OT Frequency: 5 x a week            Contractures Contractures Info: Not present    Additional Factors Info  Allergies, Code Status, Insulin Sliding Scale, Psychotropic Code Status Info: FULL code status Allergies Info: NKDA Psychotropic Info:  Zoloft Insulin Sliding Scale Info: 3 x a day       Current Medications (03/20/2015):  This is the current hospital active medication list Current Facility-Administered Medications  Medication Dose Route Frequency Provider Last Rate Last Dose  . acetaminophen (TYLENOL) tablet 650 mg  650 mg Oral Q6H PRN Reubin Milan, MD       Or  . acetaminophen (TYLENOL) suppository 650 mg  650 mg Rectal Q6H PRN Reubin Milan, MD      . albuterol (PROVENTIL) (2.5 MG/3ML) 0.083% nebulizer solution 2.5 mg  2.5 mg Nebulization Q4H PRN Domenic Polite, MD      . atorvastatin (LIPITOR) tablet 80 mg  80 mg Oral q1800 Domenic Polite, MD      . carvedilol (COREG) tablet 3.125 mg  3.125 mg Oral BID WC Domenic Polite, MD      . isosorbide mononitrate (IMDUR) 24 hr tablet 30 mg  30 mg Oral Daily Domenic Polite, MD      . mometasone-formoterol (DULERA) 100-5 MCG/ACT inhaler 2 puff  2 puff Inhalation BID Domenic Polite, MD      . ondansetron [facility]) tablet 4 mg  4 mg Oral Q6H PRN Reubin Milan, MD       Or  . ondansetron [facility]) injection 4 mg  4 mg Intravenous Q6H PRN Reubin Milan, MD      . pantoprazole (PROTONIX) injection 40 mg  40 mg Intravenous Q12H Domenic Polite, MD      . sertraline (ZOLOFT) tablet 50 mg  50 mg Oral Daily Domenic Polite, MD      . sodium chloride flush (NS) 0.9 % injection 3 mL  3 mL Intravenous Q12H Reubin Milan, MD   3 mL at 03/20/15 0550  . tamsulosin (FLOMAX) capsule 0.4 mg  0.4 mg Oral Daily Domenic Polite, MD         Discharge Medications: Please see discharge summary for a list of discharge medications.  Relevant Imaging Results:  Relevant Lab Results:   Additional Information SSN: [id number]  Lenwood Balsam, Hughes Better A, LCSW

## 2015-03-20 NOTE — Consult Note (Signed)
Referring Provider: No ref. provider found Primary Care Physician:  Pcp Not In System Primary Gastroenterologist:  Dr. Henrene Pastor  Reason for Consultation:  Anemia and heme positive stool; history of AVM's  HPI: [name] is a 65 y.o. female with a past medical history of diabetes mellitus, diabetic peripheral neuropathy, chronic kidney disease, COPD on home oxygen, hyperlipidemia, depression, CAD, chronic diastolic CHF, peripheral vascular disease, essential hypertension who was sent from a skilled nursing facility to the emergency department for evaluation of hypocalcemia 6.4 mg/dL. Redrawn level in the ED was 6.5 mg/dL and corrected calcium level to a 2.2 g/dL albumin level was 7.9 mg/dL. It should be noted, that the patient was treated for HYPERcalcemia during her hospitalization earlier this month. Also, her hemoglobin level dropped from 9.6 grams (11 days ago) to 7.7 grams (7 days ago) then 6.4 g/dL on admission.  She has received 2 units PRBC's and her hgb increased to 9.8 grams.   Per patient, and she was sent here because of an abnormal lab results.  She denies seeing blood in her stools but says that they are dark in color.  She is on iron, however, she was heme positive on this hospital stay as well.  She is on ASA and Plavix.  Last Plavix dose was 1/26.  Small bowel enteroscopy by Dr. Carlean Purl on 01/2012 showed a gastric AVM that was ablated.  Also had blood in proximal jejunum/bleeding submucosal lesion either an AVM or Dieaulafoy treated with epi injection and 3 endoclips.  Colonoscopy 09/2006 by Dr. Henrene Pastor with tiny polyps (hyperplastic) and diverticulosis.  Past Medical History  Diagnosis Date  . Peripheral vascular disease ([place])     a.s /p left common iliac and external iliac artery stents. b. H/o subclavian, mesenteric, and celiac artery stenosis (see below).  . Diabetes mellitus   . COPD (chronic obstructive pulmonary disease) (HCC)     home oxygen  . Hyperlipidemia   .  Depression   . Chronic diastolic CHF (congestive heart failure) ([place])   . Essential hypertension   . Renal artery stenosis ([place])     a. Duplex 2014: no obvious evidence of hemodynamically significant stenosis >60%, bilateral intrarenal arteries exhibit absent diastolic flow. There is evidence of elevated velocities of the mid aorta. 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  [name] CAD (coronary artery disease)   . Iron deficiency anemia   . Diabetic retinopathy ([place])   . 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 Carotid artery disease ([place])     a. Duplex 12/2014: left CEA patent with elevated velocities, 60-79% RICA (stable over serial exams), [id number] LICA stenosis (stable over serial exams), 50% LECA, elevated velocities in bilateral subclavian arteries.  . Hypertensive heart disease with congestive heart failure ([place])   . CKD (chronic kidney disease), stage III   . Tobacco abuse   . Bradycardia     a. Limiting BB titration.  . Anemia, chronic disease 05/16/2008    Qualifier: Diagnosis of  By: Percival Spanish, MD, Farrel Gordon    . Dementia   . Protein-calorie malnutrition ([place]) 01/24/2015    Past Surgical History  Procedure Laterality Date  . Carotid endarterectomy    . Cholecystectomy    . Appendectomy    . Tonsillectomy    . Cesarean section    . Shoulder arthroscopy    . Iliac artery stent  left  . Enteroscopy  01/26/2012    Procedure: ENTEROSCOPY;  Surgeon: Gatha Mayer, MD;  Location: [facility] ENDOSCOPY;  Service: Endoscopy;  Laterality: N/A;  . Cardiac catheterization N/A 01/23/2015    Procedure: Left Heart Cath and Coronary Angiography;  Surgeon: [name], MD;  Location: [facility] CV LAB;  Service: Cardiovascular;  Laterality: N/A;    Prior to Admission medications   Medication Sig Start Date End Date Taking? Authorizing Provider    amLODipine (NORVASC) 10 MG tablet Take 1 tablet (10 mg total) by mouth daily. 02/17/15  Yes Arnoldo Morale, MD  aspirin EC 81 MG tablet Take 1 tablet (81 mg total) by mouth daily. 02/17/15  Yes Arnoldo Morale, MD  atorvastatin (LIPITOR) 80 MG tablet Take 1 tablet (80 mg total) by mouth daily at 6 PM. 02/17/15  Yes Arnoldo Morale, MD  carvedilol (COREG) 3.125 MG tablet Take 1 tablet (3.125 mg total) by mouth 2 (two) times daily with a meal. 03/12/15  Yes Ripudeep K Rai, MD  clopidogrel (PLAVIX) 75 MG tablet Take 1 tablet (75 mg total) by mouth daily. 02/17/15  Yes Arnoldo Morale, MD  famotidine (PEPCID) 20 MG tablet Take 1 tablet (20 mg total) by mouth daily. 03/12/15  Yes Ripudeep Krystal Eaton, MD  Ferrous Sulfate (IRON) 325 (65 FE) MG TABS Take 325 mg by mouth 3 (three) times daily. 02/17/15  Yes Arnoldo Morale, MD  fish oil-omega-3 fatty acids 1000 MG capsule Take 1 g by mouth 3 (three) times daily. Three times daily   Yes Historical Provider, MD  Fluticasone Furoate-Vilanterol (BREO ELLIPTA) 100-25 MCG/INH AEPB Inhale 1 Inhaler into the lungs daily. 02/17/15  Yes Arnoldo Morale, MD  hydrALAZINE (APRESOLINE) 50 MG tablet Take 1 tablet (50 mg total) by mouth every 8 (eight) hours. 03/09/15  Yes Thurnell Lose, MD  hydrocortisone 2.5 % lotion Apply topically 2 (two) times daily. 01/08/15  Yes Dorena Dew, FNP  insulin aspart (NOVOLOG) 100 UNIT/ML injection Before each meal 3 times a day, 140-199 - 2 units, 200-250 - 4 units, 251-299 - 6 units,  300-349 - 8 units,  350 or above 10 units. Dispense syringes and needles as needed, Ok to switch to PEN if approved. Substitute to any brand approved. DX DM2, Code E11.65 03/09/15  Yes Thurnell Lose, MD  isosorbide mononitrate (IMDUR) 30 MG 24 hr tablet Take 1 tablet (30 mg total) by mouth daily. 02/17/15  Yes Arnoldo Morale, MD  mometasone-formoterol (DULERA) 100-5 MCG/ACT AERO Inhale 1 puff into the lungs daily. 03/12/15  Yes Ripudeep Krystal Eaton, MD  sertraline (ZOLOFT) 50 MG  tablet Take 1 tablet (50 mg total) by mouth daily. 02/04/15  Yes Arnoldo Morale, MD  tamsulosin (FLOMAX) 0.4 MG CAPS capsule Take 1 capsule (0.4 mg total) by mouth daily. 03/12/15  Yes Ripudeep Krystal Eaton, MD  albuterol (PROVENTIL HFA;VENTOLIN HFA) 108 (90 BASE) MCG/ACT inhaler Inhale 2 puffs into the lungs every 6 (six) hours as needed for wheezing or shortness of breath. 02/17/15   Arnoldo Morale, MD  cephALEXin (KEFLEX) 250 MG capsule Take 1 capsule (250 mg total) by mouth daily. X 3 more days Patient not taking: Reported on 03/19/2015 03/12/15   Ripudeep Krystal Eaton, MD  nitroGLYCERIN (NITROSTAT) 0.4 MG SL tablet Place 1 tablet (0.4 mg total) under the tongue every 5 (five) minutes as needed for chest pain. 01/28/15   Delfina Redwood, MD    Current Facility-Administered Medications  Medication Dose Route Frequency Provider Last Rate Last  Dose  . acetaminophen (TYLENOL) tablet 650 mg  650 mg Oral Q6H PRN Reubin Milan, MD       Or  . acetaminophen (TYLENOL) suppository 650 mg  650 mg Rectal Q6H PRN Reubin Milan, MD      . albuterol (PROVENTIL) (2.5 MG/3ML) 0.083% nebulizer solution 2.5 mg  2.5 mg Nebulization Q4H PRN Domenic Polite, MD      . atorvastatin (LIPITOR) tablet 80 mg  80 mg Oral q1800 Domenic Polite, MD      . carvedilol (COREG) tablet 3.125 mg  3.125 mg Oral BID WC Domenic Polite, MD      . isosorbide mononitrate (IMDUR) 24 hr tablet 30 mg  30 mg Oral Daily Domenic Polite, MD      . mometasone-formoterol (DULERA) 100-5 MCG/ACT inhaler 2 puff  2 puff Inhalation BID Domenic Polite, MD      . ondansetron [facility]) tablet 4 mg  4 mg Oral Q6H PRN Reubin Milan, MD       Or  . ondansetron [facility]) injection 4 mg  4 mg Intravenous Q6H PRN Reubin Milan, MD      . pantoprazole (PROTONIX) injection 40 mg  40 mg Intravenous Q12H Domenic Polite, MD      . sertraline (ZOLOFT) tablet 50 mg  50 mg Oral Daily Domenic Polite, MD      . sodium chloride flush (NS) 0.9 % injection 3 mL  3 mL  Intravenous Q12H Reubin Milan, MD   3 mL at 03/20/15 0550  . tamsulosin (FLOMAX) capsule 0.4 mg  0.4 mg Oral Daily Domenic Polite, MD        Allergies as of 03/19/2015  . (No Known Allergies)    Family History  Problem Relation Age of Onset  . Hypertension Mother   . Heart attack Mother   . Hypertension Father   . Angina Father   . Cancer Sister   . Cancer Brother   . Stroke Neg Hx     Social History   Social History  . Marital Status: Married    Spouse Name: N/A  . Number of Children: N/A  . Years of Education: N/A   Occupational History  . Not on file.   Social History Main Topics  . Smoking status: Former Smoker -- 0.00 packs/day for 36 years    Types: Cigarettes    Quit date: 02/11/2015  . Smokeless tobacco: Never Used     Comment: uses e-cig  . Alcohol Use: No  . Drug Use: No  . Sexual Activity: No   Other Topics Concern  . Not on file   Social History Narrative    Review of Systems: Ten point ROS is O/W negative except as mentioned in HPI.  Physical Exam: Vital signs in last 24 hours: Temp:  [97.7 F (36.5 C)-98.3 F (36.8 C)] 98 F (36.7 C) (01/27 0635) Pulse Rate:  [71-92] 92 (01/27 0635) Resp:  [16-18] 18 (01/27 0635) BP: (117-159)/(38-58) 159/55 mmHg (01/27 0635) SpO2:  [98 %-100 %] 98 % (01/27 0635) Weight:  [129 lb 3 oz (58.6 kg)-135 lb (61.236 kg)] 129 lb 3 oz (58.6 kg) (01/27 0033)   General:  Alert, Well-developed, well-nourished, pleasant and cooperative in NAD Head:  Normocephalic and atraumatic. Eyes:  Sclera clear, no icterus.   Conjunctiva pink. Ears:  Normal auditory acuity. Mouth:  No deformity or lesions.   Lungs:  Clear throughout to auscultation.  No wheezes, crackles, or rhonchi.  Heart:  Regular rate  and rhythm; no murmurs, clicks, rubs, or gallops. Abdomen:  Soft, non-distended.  BS present.  Non-tender.   Rectal:  Deferred  Msk:  Symmetrical without gross deformities. Pulses:  Normal pulses noted. Extremities:   Without clubbing or edema. Neurologic: Alert and oriented x 4; grossly normal neurologically. Skin:  Intact without significant lesions or rashes. Psych:  Alert and cooperative. Normal mood and affect.  Intake/Output from previous day: 01/26 0701 - 01/27 0700 In: 1421.5 [P.O.:250; I.V.:500; Blood:671.5] Out: 600 [Urine:600]  Lab Results:  Recent Labs  03/19/15 2018 03/20/15 0850  WBC 11.5* 14.0*  HGB 6.4* 9.8*  HCT 19.7* 30.0*  PLT 430* 390   BMET  Recent Labs  03/19/15 2018 03/20/15 0850  NA 138 138  K 4.4 4.0  CL 113* 111  CO2 16* 14*  GLUCOSE 151* 107*  BUN 45* 48*  CREATININE 4.15* 4.13*  CALCIUM 6.5* 6.6*   LFT  Recent Labs  03/20/15 0850  PROT 6.4*  ALBUMIN 2.4*  AST 17  ALT 12*  ALKPHOS 85  BILITOT 0.4    IMPRESSION:  -65 year old female with multiple medical problems who presented from SNF with abnormal labs including hypocalcemia and acute decrease in Hgb. Stool was heme positive and reports dark stools but is also on iron.  Has history of bleeding lesion in jejunum in 2013 that was treated with epi and endoclips. She was on Plavix, but this is now on hold; last dose 1/26.  PLAN: -Monitor Hgb and transfuse further prn. -Will likely need repeat EGD/enteroscopy, but should wait for Plavix wash out, which is typically 5 days.  Will discuss with Dr. Fuller Plan regarding timing.  ZEHR, JESSICA D.  03/20/2015, 10:49 AM  Pager number [phone]     Attending physician's note   I have taken a history, examined the patient and reviewed the chart. I agree with the Advanced Practitioner's note, impression and recommendations. 65 year old SNF resident with multiple caridovascular problems, COPD on home 02, DM, CKD now with worsening anemia and heme + stool. Enteroscopy in 2013 showed a gastric AVM and a bleeding lesion (AVM vs Dieulafoy's lesion) in the jejunum. She is on Plavix with last dose on 1/26. EGD/enteroscopy after 5 day Plavix wash out. Trend CBC and  transfuse to keep Hb >8.  Lucio Edward, MD [facility] Regal [phone] Mon-Fri 8a-5p [phone] after 5p, weekends, holidays

## 2015-03-20 NOTE — ED Notes (Signed)
Corey Skains, RN stayed at bedside throughout first 15 minutes of blood transfusion. Patient is alert, oriented. Denies shortness of breath, chest pain, lightheadedness, or any other out of the ordinary feelings.

## 2015-03-20 NOTE — Progress Notes (Signed)
Patient was admitted to the floor.  Patient had first unit of blood started prior to arrival on the floor. Patient is in good spirits.

## 2015-03-20 NOTE — Care Management Obs Status (Signed)
[place] NOTIFICATION   Patient Details  Name: NAVEEN VERDUN MRN: [id number] Date of Birth: 12-04-1950   Medicare Observation Status Notification Given:       Purcell Mouton, RN 03/20/2015, 4:27 PM

## 2015-03-20 NOTE — Clinical Social Work Note (Signed)
Clinical Social Work Assessment  Patient Details  Name: [name] MRN: [id number] Date of Birth: Jan 28, 1951  Date of referral:  03/20/15               Reason for consult:  Discharge Planning                Permission sought to share information with:  Family Supports Permission granted to share information::  Yes, Verbal Permission Granted  Name::     [name]  Agency::  SNF admissions  Relationship::  spouse  Contact Information:  [phone]  Housing/Transportation Living arrangements for the past 2 months:  [place] of Information:  Patient, Spouse Patient Interpreter Needed:  None Criminal Activity/Legal Involvement Pertinent to Current Situation/Hospitalization:  No - Comment as needed Significant Relationships:  Spouse Lives with:  Facility Resident Do you feel safe going back to the place where you live?  Yes Need for family participation in patient care:  Yes (Comment)  Care giving concerns:  Pt admitted from [facility] and Rehab. Per chart review, pt was admitted to facility as Letter of Guarantee (LOG) and still has days remaining under  Letter of Guarantee (LOG).   Social Worker assessment / plan:  CSW received referral that pt admitted from [facility] and Rehab. Per chart, pt has been at facility under Letter of Guarantee (LOG).  CSW met with pt and pt husband at bedside. CSW introduced self and explained role. Pt and pt spouse confirmed pt admitted from [facility] and Rehab and agreeable to return. Pt hopeful that she will be able to keep same room at facility.   CSW completed FL2 and sent clinical information to [facility] and Rehab. CSW contacted [employer] and Rehab liaison, Lenward Chancellor who confirmed pt can return to [place] when medically ready.  CSW to continue to follow to provide support and assist with return to [facility] and Rehab.   Employment status:  Disabled (Comment on whether or  not currently receiving Disability) Insurance information:  Self Pay (Medicaid Pending) PT Recommendations:  Not assessed at this time Information / Referral to community resources:  [place]  Patient/Family's Response to care:  Pt alert and oriented x 4. Pt quiet, but involved in conversation. Pt and pt spouse agreeable to return to [facility] and Rehab.  Patient/Family's Understanding of and Emotional Response to Diagnosis, Current Treatment, and Prognosis:  Pt spouse reports anticipation for a 3 to 4 day stay in the hospital.   Emotional Assessment Appearance:  Appears stated age Attitude/Demeanor/Rapport:    Affect (typically observed):  Appropriate Orientation:  Oriented to Self, Oriented to Place, Oriented to  Time, Oriented to Situation Alcohol / Substance use:  Not Applicable Psych involvement (Current and /or in the community):  No (Comment)  Discharge Needs  Concerns to be addressed:  Discharge Planning Concerns Readmission within the last 30 days:  Yes Current discharge risk:  None Barriers to Discharge:  Continued Medical Work up   Ladell Pier, LCSW 03/20/2015, 12:35 PM  [phone]

## 2015-03-20 NOTE — Progress Notes (Signed)
[facility] PROGRESS NOTE  [name] [id number] DOB: Apr 09, 1950 DOA: 03/19/2015 PCP: Pcp Not In System  Assessment/Plan: 1. 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 Multivessel CAD - Significant CAD, not surgical candidate and treated medically due to co morbidities - No chest pain or SOB. -continue Coreg, hold ASA and Plavix for now  3. AKI/CKD 4 -baseline creatinine around 3, followed by Dr.Powell at [facility], suspect worsening due to anemia -hold diuretics, transfuse, monitor -I/Os, weights  4. Hypocalcemia -but Ca corrected for Albumin was 7.9, hence does not need Rx for this -repeat Ical in am -was treated for Hypercalcemia with pamidronate and calcitonin 10days ago  5. DM -stable, SSI  6. COPD on oxygen  -stable  7. Chronic Diastolic HF -stable, monitor VOl status closely -lasix on hold  8. Hyperlipidemia  -on lipitor   DVt proph: SCDs  Code Status: [place] Family Communication: none at bedside Disposition Plan: likely back to Rehab   Consultants:  [facility]  HPI/Subjective: Feels ok, reports black/dark stools for few days  Objective: Filed Vitals:   03/20/15 0452 03/20/15 0635  BP: 152/48 159/55  Pulse: 77 92  Temp: 97.7 F (36.5 C) 98 F (36.7 C)  Resp: 18 18    Intake/Output Summary (Last 24 hours) at 03/20/15 0850 Last data filed at 03/20/15 [id number]  Gross per 24 hour  Intake 1421.5 ml  Output    600 ml  Net  821.5 ml   Filed Weights   03/19/15 1934 03/20/15 0033  Weight: 61.236 kg (135 lb) 58.6 kg (129 lb 3 oz)    Exam:   General: AAOx to self, place, slow with details  Cardiovascular: S1S2/RRR  Respiratory: CTAB  Abdomen: soft, NT, BS present  Musculoskeletal: no edema c/c   Data Reviewed: Basic Metabolic Panel:  Recent  Labs Lab 03/19/15 2018  NA 138  K 4.4  CL 113*  CO2 16*  GLUCOSE 151*  BUN 45*  CREATININE 4.15*  CALCIUM 6.5*   Liver Function Tests:  Recent Labs Lab 03/19/15 2018  AST 16  ALT 12*  ALKPHOS 80  BILITOT 0.4  PROT 6.0*  ALBUMIN 2.2*   No results for input(s): LIPASE, AMYLASE in the last 168 hours. No results for input(s): AMMONIA in the last 168 hours. CBC:  Recent Labs Lab 03/19/15 2018  WBC 11.5*  HGB 6.4*  HCT 19.7*  MCV 91.6  PLT 430*   Cardiac Enzymes: No results for input(s): CKTOTAL, CKMB, CKMBINDEX, TROPONINI in the last 168 hours. BNP (last 3 results)  Recent Labs  11/07/14 0325 12/24/14 1030 01/21/15 2041  BNP 2634.2* 1288.9* 833.4*    ProBNP (last 3 results) No results for input(s): PROBNP in the last 8760 hours.  CBG: No results for input(s): GLUCAP in the last 168 hours.  No results found for this or any previous visit (from the past 240 hour(s)).   Studies: No results found.  Scheduled Meds: . sodium chloride flush  3 mL Intravenous Q12H   Continuous Infusions:  Antibiotics Given (last 72 hours)    None      Principal Problem:   Anemia Active Problems:   GI bleed   Diabetes mellitus (HCC)   CHF (congestive heart failure) (HCC)   Essential hypertension   AKI (acute kidney injury) ([place])   CKD (  chronic kidney disease)   Hypocalcemia    Time spent: 61min    Ovidio Steele  [facility] Pager [phone]. If 7PM-7AM, please contact night-coverage at [email or web address], password [facility] 03/20/2015, 8:50 AM  LOS: 1 day

## 2015-03-20 NOTE — Care Management Obs Status (Signed)
Joiner   Patient Details  Name: MARLETA AGOSTINO MRN: [id number] Date of Birth: [birth year]   Medicare Observation Status Notification Given:   Yes    Purcell Mouton, RN 03/20/2015, 4:26 PM

## 2015-03-21 DIAGNOSIS — K922 Gastrointestinal hemorrhage, unspecified: Secondary | ICD-10-CM | POA: Diagnosis present

## 2015-03-21 DIAGNOSIS — D649 Anemia, unspecified: Secondary | ICD-10-CM | POA: Diagnosis present

## 2015-03-21 DIAGNOSIS — E1142 Type 2 diabetes mellitus with diabetic polyneuropathy: Secondary | ICD-10-CM | POA: Diagnosis present

## 2015-03-21 DIAGNOSIS — Q2733 Arteriovenous malformation of digestive system vessel: Secondary | ICD-10-CM | POA: Diagnosis not present

## 2015-03-21 DIAGNOSIS — I25119 Atherosclerotic heart disease of native coronary artery with unspecified angina pectoris: Secondary | ICD-10-CM | POA: Diagnosis present

## 2015-03-21 DIAGNOSIS — Z9981 Dependence on supplemental oxygen: Secondary | ICD-10-CM | POA: Diagnosis not present

## 2015-03-21 DIAGNOSIS — K921 Melena: Secondary | ICD-10-CM

## 2015-03-21 DIAGNOSIS — D62 Acute posthemorrhagic anemia: Secondary | ICD-10-CM | POA: Diagnosis present

## 2015-03-21 DIAGNOSIS — N184 Chronic kidney disease, stage 4 (severe): Secondary | ICD-10-CM | POA: Diagnosis present

## 2015-03-21 DIAGNOSIS — E440 Moderate protein-calorie malnutrition: Secondary | ICD-10-CM | POA: Diagnosis present

## 2015-03-21 DIAGNOSIS — T39015A Adverse effect of aspirin, initial encounter: Secondary | ICD-10-CM | POA: Diagnosis present

## 2015-03-21 DIAGNOSIS — I701 Atherosclerosis of renal artery: Secondary | ICD-10-CM | POA: Diagnosis present

## 2015-03-21 DIAGNOSIS — I5032 Chronic diastolic (congestive) heart failure: Secondary | ICD-10-CM | POA: Diagnosis present

## 2015-03-21 DIAGNOSIS — L89159 Pressure ulcer of sacral region, unspecified stage: Secondary | ICD-10-CM

## 2015-03-21 DIAGNOSIS — E785 Hyperlipidemia, unspecified: Secondary | ICD-10-CM | POA: Diagnosis present

## 2015-03-21 DIAGNOSIS — Z7902 Long term (current) use of antithrombotics/antiplatelets: Secondary | ICD-10-CM | POA: Diagnosis not present

## 2015-03-21 DIAGNOSIS — R339 Retention of urine, unspecified: Secondary | ICD-10-CM | POA: Diagnosis present

## 2015-03-21 DIAGNOSIS — J9611 Chronic respiratory failure with hypoxia: Secondary | ICD-10-CM | POA: Diagnosis present

## 2015-03-21 DIAGNOSIS — L89152 Pressure ulcer of sacral region, stage 2: Secondary | ICD-10-CM | POA: Diagnosis present

## 2015-03-21 DIAGNOSIS — Z87891 Personal history of nicotine dependence: Secondary | ICD-10-CM | POA: Diagnosis not present

## 2015-03-21 DIAGNOSIS — I2510 Atherosclerotic heart disease of native coronary artery without angina pectoris: Secondary | ICD-10-CM | POA: Diagnosis present

## 2015-03-21 DIAGNOSIS — I130 Hypertensive heart and chronic kidney disease with heart failure and stage 1 through stage 4 chronic kidney disease, or unspecified chronic kidney disease: Secondary | ICD-10-CM | POA: Diagnosis present

## 2015-03-21 DIAGNOSIS — E872 Acidosis: Secondary | ICD-10-CM | POA: Diagnosis present

## 2015-03-21 DIAGNOSIS — N179 Acute kidney failure, unspecified: Secondary | ICD-10-CM | POA: Diagnosis present

## 2015-03-21 DIAGNOSIS — J449 Chronic obstructive pulmonary disease, unspecified: Secondary | ICD-10-CM | POA: Diagnosis present

## 2015-03-21 DIAGNOSIS — K633 Ulcer of intestine: Secondary | ICD-10-CM | POA: Diagnosis present

## 2015-03-21 LAB — CBC
HEMATOCRIT: 26.1 % — AB (ref 36.0–46.0)
HEMOGLOBIN: 8.7 g/dL — AB (ref 12.0–15.0)
MCH: 30.4 pg (ref 26.0–34.0)
MCHC: 33.3 g/dL (ref 30.0–36.0)
MCV: 91.3 fL (ref 78.0–100.0)
Platelets: 361 10*3/uL (ref 150–400)
RBC: 2.86 MIL/uL — ABNORMAL LOW (ref 3.87–5.11)
RDW: 15.3 % (ref 11.5–15.5)
WBC: 10 10*3/uL (ref 4.0–10.5)

## 2015-03-21 LAB — GLUCOSE, CAPILLARY
GLUCOSE-CAPILLARY: 128 mg/dL — AB (ref 65–99)
GLUCOSE-CAPILLARY: 140 mg/dL — AB (ref 65–99)
GLUCOSE-CAPILLARY: 87 mg/dL (ref 65–99)
Glucose-Capillary: 107 mg/dL — ABNORMAL HIGH (ref 65–99)

## 2015-03-21 LAB — TYPE AND SCREEN
ABO/RH(D): O POS
Antibody Screen: NEGATIVE
UNIT DIVISION: 0
Unit division: 0

## 2015-03-21 LAB — BASIC METABOLIC PANEL
Anion gap: 10 (ref 5–15)
BUN: 55 mg/dL — AB (ref 6–20)
CALCIUM: 6.6 mg/dL — AB (ref 8.9–10.3)
CHLORIDE: 116 mmol/L — AB (ref 101–111)
CO2: 17 mmol/L — AB (ref 22–32)
CREATININE: 4 mg/dL — AB (ref 0.44–1.00)
GFR calc Af Amer: 13 mL/min — ABNORMAL LOW (ref >60)
GFR calc non Af Amer: 11 mL/min — ABNORMAL LOW (ref >60)
GLUCOSE: 124 mg/dL — AB (ref 65–99)
Potassium: 4.4 mmol/L (ref 3.5–5.1)
Sodium: 143 mmol/L (ref 135–145)

## 2015-03-21 LAB — VITAMIN D 25 HYDROXY (VIT D DEFICIENCY, FRACTURES): VIT D 25 HYDROXY: 5.5 ng/mL — AB (ref 30.0–100.0)

## 2015-03-21 LAB — PARATHYROID HORMONE, INTACT (NO CA): PTH: 97 pg/mL — ABNORMAL HIGH (ref 15–65)

## 2015-03-21 MED ORDER — SODIUM CHLORIDE 0.9 % IV SOLN
INTRAVENOUS | Status: AC
  Administered 2015-03-21 – 2015-03-22 (×2): via INTRAVENOUS

## 2015-03-21 NOTE — Progress Notes (Signed)
     [facility] Progress Note  Subjective:  Doing well.  No complaints.  Still having dark stools, but once again, she is on iron.  Hgb down about 1 gram to 8.7 grams this AM.  Objective:  Vital signs in last 24 hours: Temp:  [97.9 F (36.6 C)-98.3 F (36.8 C)] 97.9 F (36.6 C) (01/28 0610) Pulse Rate:  [71-72] 72 (01/28 0610) Resp:  [16-18] 16 (01/28 0610) BP: (126-143)/(46-58) 143/58 mmHg (01/28 1048) SpO2:  [97 %-100 %] 99 % (01/28 1048)   General:  Alert, Well-developed, in NAD Heart:  Regular rate and rhythm; SEM noted. Pulm:  CTAB.  No W/R/R. Abdomen:  Soft, non-distended. Normal bowel sounds.  Non-tender. Extremities:  Without edema. Neurologic:  Alert and oriented x 4;  grossly normal neurologically. Psych:  Alert and cooperative. Normal mood and affect.  Intake/Output from previous day: 01/27 0701 - 01/28 0700 In: 240 [P.O.:240] Out: 1650 [Urine:1650]  Lab Results:  Recent Labs  03/19/15 2018 03/20/15 0850 03/21/15 0541  WBC 11.5* 14.0* 10.0  HGB 6.4* 9.8* 8.7*  HCT 19.7* 30.0* 26.1*  PLT 430* 390 361   BMET  Recent Labs  03/19/15 2018 03/20/15 0850 03/21/15 0541  NA 138 138 143  K 4.4 4.0 4.4  CL 113* 111 116*  CO2 16* 14* 17*  GLUCOSE 151* 107* 124*  BUN 45* 48* 55*  CREATININE 4.15* 4.13* 4.00*  CALCIUM 6.5* 6.6* 6.6*   LFT  Recent Labs  03/20/15 0850  PROT 6.4*  ALBUMIN 2.4*  AST 17  ALT 12*  ALKPHOS 85  BILITOT 0.4   Assessment / Plan: -65 year old female with multiple medical problems who presented from SNF with abnormal labs including hypocalcemia and acute decrease in Hgb. Stool was heme positive and reports dark stools but is also on iron. Has history of bleeding lesion in jejunum in 2013 that was treated with epi and endoclips. She was on Plavix, but this is now on hold; last dose 1/26.  S/p 2 units PRBC's.  Hgb down about 1 gram this AM.  *Continue to monitor Hgb and transfuse further prn. *EGD/enteroscopy  after 5 day Plavix washout, which would be 1/31.  Will need MAC.  Will see back on Monday.   LOS: 2 days   ZEHR, JESSICA D.  03/21/2015, 11:41 AM  Pager number [phone]     Attending physician's note   I have taken an interval history, reviewed the chart and examined the patient. I agree with the Advanced Practitioner's note, impression and recommendations. Trend CBC. Transfuse to keep Hb > 8. Awaiting Plavix wash out for EGD/enteroscopy likely on Tuesday.   Lucio Edward, MD [facility] [phone] Mon-Fri 8a-5p [phone] after 5p, weekends, holidays

## 2015-03-21 NOTE — Progress Notes (Signed)
[facility] PROGRESS NOTE  [name] [id number] DOB: [birth year] DOA: 03/19/2015 PCP: Pcp Not In System  Brief narrative 65 year old female with multiple comorbidities including COPD with chronic respiratory failure. Nasal cannula, chronic kidney disease stage IV (baseline creatinine of 3), multivessel CAD , chronic diastolic CHF, uncontrolled diabetes mellitus with peripheral neuropathy, peripheral vascular disease, chronic anemia, pressure ulcer, depression, hyperlipidemia, peripheral vascular disease, currently a resident [facility] was sent from the facility for hypocalcemia. Calcium was 6.4 (corrected 7.9mg /dl). She was recently treated for hypercalcemia during recent hospitalization this month. However further blood work done in the ED showed hemoglobin dropped to 6.4 from 9.6. Stool for Hemoccult was positive. Creatinine was also worsened to 4.15, baseline around 3. Patient is unaware why she was sent to the hospital. Admitted to hospitalist service for further management of her anemia with Hemoccult stool and acute on chronic kidney disease.  Assessment/Plan: Anemia with heme-positive stools Patient has history of gastric and jejunal AVMs 2013 was treated with epi and endoclips. Seen by GI plan on repeat endoscopy but since she is on Plavix recommend that she should be off it for at least 5 days. Received 2 units PRBC with hemoglobin improved to 8.7. -Monitor daily H&H. Holding aspirin and Plavix. Continue PPI twice a day -GI consult appreciated. (Plan on EGD on 1/31)  Acute on chronic kidney disease stage IV Follows with Dr. Florene Glen. Baseline creatinine around 3. Renal function was worsened during recent hospitalization and was thought to be due to dehydration. Holding diuretics. (Lasix was held during recent hospital discharge due to acute on chronic kidney disease). Some improvement this morning. Avoiding nephrotoxins. -Would place  her on gentle hydration. Monitor urine output closely.  pseudohypocalcemia Corrected calcium 7.9. Recently treated for hypercalcemia with calcitonin and pamidronate.   Coronary artery disease/peripheral vascular disease Continue Coreg (dose recently changed was present. Continue statin. Holding aspirin and Plavix  Type 2 diabetes mellitus Stable. Monitor on sliding scale coverage.  Sacral pressure ulcer Has some purulence and patient complains of pain. Wound care consulted.  Chronic Diastolic CHF Euvolemic. Continue beta blocker and statin.  Urinary retention  Has been on Foley catheter since his recent discharge on 02/1915. Not sure if voiding trial was done at the facility. Will attempt voiding trial in a.m. Continue Flomax.  COPD with chronic respiratory failure On home O2. Stable. Continue when necessary nebs. Resume dulera.  Protein calorie malnutrition. Consult dietitian.   DVT prophylaxis: SCDs Diet: Cardiac/ diabetic   CODE STATUS: Full code Family Communication: none at bedside Disposition Plan: continue inpt monitoring   Consultants:  [facility]  Procedures:  NONE  Antibiotics:   None  HPI/Subjective:  Seen and examined. Complains of some pain over her pressure ulcer.  Objective: Filed Vitals:   03/21/15 0610 03/21/15 1048  BP: 142/46 143/58  Pulse: 72   Temp: 97.9 F (36.6 C)   Resp: 16     Intake/Output Summary (Last 24 hours) at 03/21/15 1245 Last data filed at 03/21/15 0617  Gross per 24 hour  Intake    240 ml  Output   1650 ml  Net  -1410 ml   Filed Weights   03/19/15 1934 03/20/15 0033  Weight: 61.236 kg (135 lb) 58.6 kg (129 lb 3 oz)    Exam:   General:  Elderly female not in distress  HEENT: Pallor present, moist mucosa  Chest: Clear bilaterally  CVS: Normal S1 and S2, no murmurs rub or gallop  GI: Soft, nondistended, nontender, foley draining clear urine  Musculoskeletal: Warm, no edema, 3 cm sacral ulcer  draining mucopurulent discharge  CNS: Alert and oriented  Data Reviewed: Basic Metabolic Panel:  Recent Labs Lab 03/19/15 2018 03/20/15 0850 03/21/15 0541  NA 138 138 143  K 4.4 4.0 4.4  CL 113* 111 116*  CO2 16* 14* 17*  GLUCOSE 151* 107* 124*  BUN 45* 48* 55*  CREATININE 4.15* 4.13* 4.00*  CALCIUM 6.5* 6.6* 6.6*  MG  --  1.7  --   PHOS  --  2.9  --    Liver Function Tests:  Recent Labs Lab 03/19/15 2018 03/20/15 0850  AST 16 17  ALT 12* 12*  ALKPHOS 80 85  BILITOT 0.4 0.4  PROT 6.0* 6.4*  ALBUMIN 2.2* 2.4*   No results for input(s): LIPASE, AMYLASE in the last 168 hours. No results for input(s): AMMONIA in the last 168 hours. CBC:  Recent Labs Lab 03/19/15 2018 03/20/15 0850 03/21/15 0541  WBC 11.5* 14.0* 10.0  HGB 6.4* 9.8* 8.7*  HCT 19.7* 30.0* 26.1*  MCV 91.6 92.0 91.3  PLT 430* 390 361   Cardiac Enzymes: No results for input(s): CKTOTAL, CKMB, CKMBINDEX, TROPONINI in the last 168 hours. BNP (last 3 results)  Recent Labs  11/07/14 0325 12/24/14 1030 01/21/15 2041  BNP 2634.2* 1288.9* 833.4*    ProBNP (last 3 results) No results for input(s): PROBNP in the last 8760 hours.  CBG:  Recent Labs Lab 03/20/15 1159 03/20/15 1804 03/20/15 2348 03/21/15 0607 03/21/15 1227  GLUCAP 135* 131* 77 107* 140*    No results found for this or any previous visit (from the past 240 hour(s)).   Studies: No results found.  Scheduled Meds: . atorvastatin  80 mg Oral q1800  . carvedilol  3.125 mg Oral BID WC  . isosorbide mononitrate  30 mg Oral Daily  . mometasone-formoterol  2 puff Inhalation BID  . pantoprazole (PROTONIX) IV  40 mg Intravenous Q12H  . sertraline  50 mg Oral Daily  . sodium chloride flush  3 mL Intravenous Q12H  . tamsulosin  0.4 mg Oral Daily   Continuous Infusions:     Time spent:  25 minutes    [name], [place]  [facility] Pager  [phone]. If 7PM-7AM, please contact night-coverage at [email or web address],  password [facility] 03/21/2015, 12:45 PM  LOS: 2 days

## 2015-03-22 DIAGNOSIS — L8990 Pressure ulcer of unspecified site, unspecified stage: Secondary | ICD-10-CM

## 2015-03-22 LAB — BASIC METABOLIC PANEL
Anion gap: 11 (ref 5–15)
BUN: 49 mg/dL — AB (ref 6–20)
CALCIUM: 6.2 mg/dL — AB (ref 8.9–10.3)
CO2: 15 mmol/L — AB (ref 22–32)
CREATININE: 3.47 mg/dL — AB (ref 0.44–1.00)
Chloride: 113 mmol/L — ABNORMAL HIGH (ref 101–111)
GFR calc non Af Amer: 13 mL/min — ABNORMAL LOW (ref >60)
GFR, EST AFRICAN AMERICAN: 15 mL/min — AB (ref >60)
Glucose, Bld: 102 mg/dL — ABNORMAL HIGH (ref 65–99)
Potassium: 3.8 mmol/L (ref 3.5–5.1)
SODIUM: 139 mmol/L (ref 135–145)

## 2015-03-22 LAB — GLUCOSE, CAPILLARY
GLUCOSE-CAPILLARY: 103 mg/dL — AB (ref 65–99)
GLUCOSE-CAPILLARY: 115 mg/dL — AB (ref 65–99)
Glucose-Capillary: 104 mg/dL — ABNORMAL HIGH (ref 65–99)

## 2015-03-22 LAB — CBC
HCT: 24.8 % — ABNORMAL LOW (ref 36.0–46.0)
Hemoglobin: 8.3 g/dL — ABNORMAL LOW (ref 12.0–15.0)
MCH: 29.7 pg (ref 26.0–34.0)
MCHC: 33.5 g/dL (ref 30.0–36.0)
MCV: 88.9 fL (ref 78.0–100.0)
Platelets: 271 10*3/uL (ref 150–400)
RBC: 2.79 MIL/uL — ABNORMAL LOW (ref 3.87–5.11)
RDW: 15.1 % (ref 11.5–15.5)
WBC: 8.5 10*3/uL (ref 4.0–10.5)

## 2015-03-22 LAB — CALCIUM, IONIZED: CALCIUM, IONIZED, SERUM: 3.7 mg/dL — AB (ref 4.5–5.6)

## 2015-03-22 MED ORDER — SODIUM BICARBONATE 650 MG PO TABS
650.0000 mg | ORAL_TABLET | Freq: Two times a day (BID) | ORAL | Status: DC
  Administered 2015-03-22 – 2015-03-26 (×7): 650 mg via ORAL
  Filled 2015-03-22 (×7): qty 1

## 2015-03-22 MED ORDER — COLLAGENASE 250 UNIT/GM EX OINT
TOPICAL_OINTMENT | Freq: Every day | CUTANEOUS | Status: DC
  Administered 2015-03-22 – 2015-03-24 (×3): via TOPICAL
  Filled 2015-03-22 (×2): qty 30

## 2015-03-22 NOTE — Consult Note (Signed)
WOC wound consult note Reason for Consult:Unstageable pressure injury to sacrum, patient reports pain when lying on back and is turning side to side while in bed to avoid the supine position. Is incontinence of dark green/black stool at the time of my visit. Wound type:pressure Pressure Ulcer POA: Yes Measurement:3cm x 3.8cm with depth obscured by the presence of adherent, soft yellow eschar Wound bed:As described above Drainage (amount, consistency, odor) Small amount of light yellow exudate Periwound:intact, dry Dressing procedure/placement/frequency:I will initiate a dual plan including hydrotherapy by PT Monday-Saturday followed by conservative sharp wound debridement (CSWD), on Sundays, Nursing to perform wound care.  Collagenase (Santyl), and enzymatic wound debridement agent will be used daily and PRN soiling of dressings to loosen and dissolve adherent necrotic tissue. I have provided a pressure redistribution chair pad for her use when OOB in chair. She is turning side to side while in bed, so I do not feel a therapeutic mattress with low air loss feature is necessary at this time.  Basic/house skin care products are to be used following incontinence episodes. [place] nursing team will not follow, but will remain available to this patient, the nursing and medical teams.  Please re-consult if needed. Thanks, Maudie Flakes, MSN, RN, [place], [facility]  Pager# [phone]

## 2015-03-22 NOTE — Progress Notes (Signed)
[facility] PROGRESS NOTE  [name] [id number] DOB: 07/30/1950 DOA: 03/19/2015 PCP: Pcp Not In System  Brief narrative 65 year old female with multiple comorbidities including COPD with chronic respiratory failure. Nasal cannula, chronic kidney disease stage IV (baseline creatinine of 3), multivessel CAD , chronic diastolic CHF, uncontrolled diabetes mellitus with peripheral neuropathy, peripheral vascular disease, chronic anemia, pressure ulcer, depression, hyperlipidemia, peripheral vascular disease, currently a resident of [facility] was sent from the facility for hypocalcemia. Calcium was 6.4 (corrected 7.9mg /dl). She was recently treated for hypercalcemia during recent hospitalization this month. However further blood work done in the ED showed hemoglobin dropped to 6.4 from 9.6. Stool for Hemoccult was positive. Creatinine was also worsened to 4.15, baseline around 3. Patient is unaware why she was sent to the hospital. Admitted to hospitalist service for further management of her anemia with Hemoccult stool and acute on chronic kidney disease.  Assessment/Plan: Anemia with heme-positive stools Patient has history of gastric and jejunal AVMs 2013 was treated with epi and endoclips. Seen by GI plan on repeat endoscopy but since she is on Plavix recommend that she should be off it for at least 5 days. Received 2 units PRBC with hemoglobin improved to >8. -Monitor daily H&H. Holding aspirin and Plavix. Continue PPI twice a day -GI consult appreciated. (EGD possibly on 1/31)  Acute on chronic kidney disease stage IV Follows with Dr. Florene Glen. Baseline creatinine around 3. Renal function was worsened during recent hospitalization and was thought to be due to dehydration. Holding diuretics. (Lasix was held during recent hospital discharge due to acute on chronic kidney disease). Slowly improving this admission. Avoiding nephrotoxins. -on gentle  hydration. Monitor urine output closely. Add low bicarbonate.  pseudohypocalcemia Corrected calcium 7.9. Recently treated for hypercalcemia with calcitonin and pamidronate.   Coronary artery disease/peripheral vascular disease Continue Coreg (dose recently changed ). Continue statin. Holding aspirin and Plavix  Type 2 diabetes mellitus Stable. Monitor on sliding scale coverage.  Sacral pressure ulcer Has some purulence and patient complains of pain. Wound care consult appreciated.  Chronic Diastolic CHF Euvolemic. Continue beta blocker and statin.  Urinary retention  Has been on Foley catheter since his recent discharge on 02/1915. Not sure if voiding trial was done at the facility.  attempt voiding trial today.  Continue Flomax.  COPD with chronic respiratory failure On home O2. Stable. Continue when necessary nebs. Resume dulera.  Protein calorie malnutrition. Consult dietitian.   DVT prophylaxis: SCDs Diet: Cardiac/ diabetic   CODE STATUS: Full code Family Communication: none at bedside Disposition Plan: continue inpt monitoring. Return to skilled nursing facility once stable after EGD.   Consultants:  [facility]  Procedures:  NONE  Antibiotics:   None  HPI/Subjective:  Seen and examined. Denies any specific symptoms.  Objective: Filed Vitals:   03/21/15 2055 03/22/15 0616  BP: 113/48 139/51  Pulse: 80 92  Temp: 98.4 F (36.9 C) 98.5 F (36.9 C)  Resp: 18 18    Intake/Output Summary (Last 24 hours) at 03/22/15 1306 Last data filed at 03/22/15 0809  Gross per 24 hour  Intake 863.75 ml  Output   1550 ml  Net -686.25 ml   Filed Weights   03/19/15 1934 03/20/15 0033  Weight: 61.236 kg (135 lb) 58.6 kg (129 lb 3 oz)    Exam:   General:   not in distress  HEENT: Pallor present, moist mucosa  Chest: Clear bilaterally  CVS: Normal S1 and S2, no murmurs  GI: Soft, nondistended, nontender, foley draining clear urine  Musculoskeletal:  Warm, no edema, 3 cm sacral ulcer draining mucopurulent discharge  CNS: Alert and oriented  Data Reviewed: Basic Metabolic Panel:  Recent Labs Lab 03/19/15 2018 03/20/15 0850 03/21/15 0541 03/22/15 0521  NA 138 138 143 139  K 4.4 4.0 4.4 3.8  CL 113* 111 116* 113*  CO2 16* 14* 17* 15*  GLUCOSE 151* 107* 124* 102*  BUN 45* 48* 55* 49*  CREATININE 4.15* 4.13* 4.00* 3.47*  CALCIUM 6.5* 6.6* 6.6* 6.2*  MG  --  1.7  --   --   PHOS  --  2.9  --   --    Liver Function Tests:  Recent Labs Lab 03/19/15 2018 03/20/15 0850  AST 16 17  ALT 12* 12*  ALKPHOS 80 85  BILITOT 0.4 0.4  PROT 6.0* 6.4*  ALBUMIN 2.2* 2.4*   No results for input(s): LIPASE, AMYLASE in the last 168 hours. No results for input(s): AMMONIA in the last 168 hours. CBC:  Recent Labs Lab 03/19/15 2018 03/20/15 0850 03/21/15 0541 03/22/15 0521  WBC 11.5* 14.0* 10.0 8.5  HGB 6.4* 9.8* 8.7* 8.3*  HCT 19.7* 30.0* 26.1* 24.8*  MCV 91.6 92.0 91.3 88.9  PLT 430* 390 361 271   Cardiac Enzymes: No results for input(s): CKTOTAL, CKMB, CKMBINDEX, TROPONINI in the last 168 hours. BNP (last 3 results)  Recent Labs  11/07/14 0325 12/24/14 1030 01/21/15 2041  BNP 2634.2* 1288.9* 833.4*    ProBNP (last 3 results) No results for input(s): PROBNP in the last 8760 hours.  CBG:  Recent Labs Lab 03/21/15 1227 03/21/15 1755 03/21/15 2334 03/22/15 0628 03/22/15 1232  GLUCAP 140* 128* 87 103* 115*    No results found for this or any previous visit (from the past 240 hour(s)).   Studies: No results found.  Scheduled Meds: . atorvastatin  80 mg Oral q1800  . carvedilol  3.125 mg Oral BID WC  . collagenase   Topical Daily  . isosorbide mononitrate  30 mg Oral Daily  . mometasone-formoterol  2 puff Inhalation BID  . pantoprazole (PROTONIX) IV  40 mg Intravenous Q12H  . sertraline  50 mg Oral Daily  . sodium chloride flush  3 mL Intravenous Q12H  . tamsulosin  0.4 mg Oral Daily   Continuous  Infusions: . sodium chloride 75 mL/hr at 03/22/15 0435      Time spent:  25 minutes    Kendryck Lacroix  [facility] Pager  [phone]. If 7PM-7AM, please contact night-coverage at [email or web address], password [facility] 03/22/2015, 1:06 PM  LOS: 3 days

## 2015-03-23 DIAGNOSIS — R339 Retention of urine, unspecified: Secondary | ICD-10-CM | POA: Diagnosis present

## 2015-03-23 DIAGNOSIS — L98429 Non-pressure chronic ulcer of back with unspecified severity: Secondary | ICD-10-CM | POA: Diagnosis present

## 2015-03-23 DIAGNOSIS — K2951 Unspecified chronic gastritis with bleeding: Secondary | ICD-10-CM

## 2015-03-23 LAB — CBC
HEMATOCRIT: 25.7 % — AB (ref 36.0–46.0)
HEMOGLOBIN: 8.3 g/dL — AB (ref 12.0–15.0)
MCH: 29.4 pg (ref 26.0–34.0)
MCHC: 32.3 g/dL (ref 30.0–36.0)
MCV: 91.1 fL (ref 78.0–100.0)
Platelets: 227 10*3/uL (ref 150–400)
RBC: 2.82 MIL/uL — ABNORMAL LOW (ref 3.87–5.11)
RDW: 15.2 % (ref 11.5–15.5)
WBC: 6.8 10*3/uL (ref 4.0–10.5)

## 2015-03-23 LAB — BASIC METABOLIC PANEL
ANION GAP: 12 (ref 5–15)
BUN: 47 mg/dL — ABNORMAL HIGH (ref 6–20)
CALCIUM: 6 mg/dL — AB (ref 8.9–10.3)
CHLORIDE: 112 mmol/L — AB (ref 101–111)
CO2: 14 mmol/L — AB (ref 22–32)
Creatinine, Ser: 3.22 mg/dL — ABNORMAL HIGH (ref 0.44–1.00)
GFR calc non Af Amer: 14 mL/min — ABNORMAL LOW (ref >60)
GFR, EST AFRICAN AMERICAN: 16 mL/min — AB (ref >60)
Glucose, Bld: 71 mg/dL (ref 65–99)
Potassium: 3.3 mmol/L — ABNORMAL LOW (ref 3.5–5.1)
SODIUM: 138 mmol/L (ref 135–145)

## 2015-03-23 LAB — GLUCOSE, CAPILLARY
GLUCOSE-CAPILLARY: 65 mg/dL (ref 65–99)
GLUCOSE-CAPILLARY: 94 mg/dL (ref 65–99)
GLUCOSE-CAPILLARY: 73 mg/dL (ref 65–99)
GLUCOSE-CAPILLARY: 101 mg/dL — AB (ref 65–99)
Glucose-Capillary: 110 mg/dL — ABNORMAL HIGH (ref 65–99)

## 2015-03-23 MED ORDER — MORPHINE SULFATE (PF) 2 MG/ML IV SOLN
2.0000 mg | Freq: Once | INTRAVENOUS | Status: AC
  Administered 2015-03-23: 2 mg via INTRAVENOUS
  Filled 2015-03-23: qty 1

## 2015-03-23 MED ORDER — POTASSIUM CHLORIDE CRYS ER 20 MEQ PO TBCR
40.0000 meq | EXTENDED_RELEASE_TABLET | Freq: Once | ORAL | Status: AC
  Administered 2015-03-23: 40 meq via ORAL
  Filled 2015-03-23: qty 2

## 2015-03-23 NOTE — Progress Notes (Signed)
[facility] PROGRESS NOTE  [name] [id number] DOB: 10-15-1950 DOA: 03/19/2015 PCP: Pcp Not In System  Brief narrative 65 year old female with multiple comorbidities including COPD with chronic respiratory failure. Nasal cannula, chronic kidney disease stage IV (baseline creatinine of 3), multivessel CAD , chronic diastolic CHF, uncontrolled diabetes mellitus with peripheral neuropathy, peripheral vascular disease, chronic anemia, pressure ulcer, depression, hyperlipidemia, peripheral vascular disease, currently a resident of [facility] was sent from the facility for hypocalcemia. Calcium was 6.4 (corrected 7.9mg /dl). She was recently treated for hypercalcemia during recent hospitalization this month. However further blood work done in the ED showed hemoglobin dropped to 6.4 from 9.6. Stool for Hemoccult was positive. Creatinine was also worsened to 4.15, baseline around 3. Patient is unaware why she was sent to the hospital. Admitted to hospitalist service for further management of her anemia with Hemoccult stool and acute on chronic kidney disease.  Assessment/Plan: Anemia with heme-positive stools Patient has history of gastric and jejunal AVMs 2013 was treated with epi and endoclips. Seen by [name] plan on repeat endoscopy but since she is on Plavix recommend that she should be off it for at least 5 days. Received 2 units PRBC with hemoglobin improved to >8. still having some dark stools. Holding aspirin and Plavix. Continue PPI twice a day -[name] consult appreciated. EGD with enteroscopy [place].  Acute on chronic kidney disease stage IV Follows with [name]. Baseline creatinine around 3. Renal function was worsened during recent hospitalization and was thought to be due to dehydration. Holding diuretics. (Lasix was held during recent hospital discharge due to acute on chronic kidney disease). Slowly improving this admission. Avoiding  nephrotoxins. -on gentle hydration with improvement. Has low bicarbonate which is being replenished.  pseudohypocalcemia Corrected calcium >7.5. Recently treated for hypercalcemia with calcitonin and pamidronate.   Coronary artery disease/peripheral vascular disease Continue Coreg (dose recently changed ). Continue statin. Holding aspirin and Plavix  Type 2 diabetes mellitus Stable. Monitor on sliding scale coverage.  Sacral pressure ulcer Has some purulence and patient complains of pain. Wound care consult appreciated.  Chronic Diastolic CHF Euvolemic. Continue beta blocker and statin.  Urinary retention  Has been on Foley catheter since his recent discharge on 02/1915. Voiding trial today.  Continue Flomax.  COPD with chronic respiratory failure On home O2. Stable. Continue when necessary nebs. Resume dulera.  Protein calorie malnutrition. Consult dietitian.  Hypokalemia  replenished   DVT prophylaxis: SCDs Diet: Cardiac/ diabetic. Nothing by mouth after midnight   CODE STATUS: Full code Family Communication: none at bedside Disposition Plan: EGD and enteroscopy tomorrow.   Consultants:  [name]  Procedures:  NONE  Antibiotics:   None  HPI/Subjective:  Seen and examined. Denies any specific symptoms.  Objective: Filed Vitals:   03/22/15 2122 03/23/15 0515  BP: 121/41 139/60  Pulse: 80 79  Temp: 98.8 F (37.1 C) 98.2 F (36.8 C)  Resp: 18 20    Intake/Output Summary (Last 24 hours) at 03/23/15 1303 Last data filed at 03/23/15 0915  Gross per 24 hour  Intake 2038.75 ml  Output   1950 ml  Net  88.75 ml   Filed Weights   03/19/15 1934 03/20/15 0033  Weight: 61.236 kg (135 lb) 58.6 kg (129 lb 3 oz)    Exam:   General:   not in distress  HEENT: Pallor present, moist mucosa  Chest: Clear bilaterally  CVS: Normal S1 and S2, no murmurs   [name]: Soft, nondistended, nontender, foley draining  clear urine  Musculoskeletal: Warm, no  edema, 3 cm sacral ulcer draining mucopurulent discharge  CNS: Alert and oriented  Data Reviewed: Basic Metabolic Panel:  Recent Labs Lab 03/19/15 2018 03/20/15 0850 03/21/15 0541 03/22/15 0521 03/23/15 0530  NA 138 138 143 139 138  K 4.4 4.0 4.4 3.8 3.3*  CL 113* 111 116* 113* 112*  CO2 16* 14* 17* 15* 14*  GLUCOSE 151* 107* 124* 102* 71  BUN 45* 48* 55* 49* 47*  CREATININE 4.15* 4.13* 4.00* 3.47* 3.22*  CALCIUM 6.5* 6.6* 6.6* 6.2* 6.0*  MG  --  1.7  --   --   --   PHOS  --  2.9  --   --   --    Liver Function Tests:  Recent Labs Lab 03/19/15 2018 03/20/15 0850  AST 16 17  ALT 12* 12*  ALKPHOS 80 85  BILITOT 0.4 0.4  PROT 6.0* 6.4*  ALBUMIN 2.2* 2.4*   No results for input(s): LIPASE, AMYLASE in the last 168 hours. No results for input(s): AMMONIA in the last 168 hours. CBC:  Recent Labs Lab 03/19/15 2018 03/20/15 0850 03/21/15 0541 03/22/15 0521 03/23/15 0530  WBC 11.5* 14.0* 10.0 8.5 6.8  HGB 6.4* 9.8* 8.7* 8.3* 8.3*  HCT 19.7* 30.0* 26.1* 24.8* 25.7*  MCV 91.6 92.0 91.3 88.9 91.1  PLT 430* 390 361 271 227   Cardiac Enzymes: No results for input(s): CKTOTAL, CKMB, CKMBINDEX, TROPONINI in the last 168 hours. BNP (last 3 results)  Recent Labs  11/07/14 0325 12/24/14 1030 01/21/15 2041  BNP 2634.2* 1288.9* 833.4*    ProBNP (last 3 results) No results for input(s): PROBNP in the last 8760 hours.  CBG:  Recent Labs Lab 03/22/15 0628 03/22/15 1232 03/22/15 1712 03/23/15 0530 03/23/15 0730  GLUCAP 103* 115* 104* 65 94    No results found for this or any previous visit (from the past 240 hour(s)).   Studies: No results found.  Scheduled Meds: . atorvastatin  80 mg Oral q1800  . carvedilol  3.125 mg Oral BID WC  . collagenase   Topical Daily  . isosorbide mononitrate  30 mg Oral Daily  . mometasone-formoterol  2 puff Inhalation BID  . pantoprazole (PROTONIX) IV  40 mg Intravenous Q12H  . sertraline  50 mg Oral Daily  . sodium  bicarbonate  650 mg Oral BID  . sodium chloride flush  3 mL Intravenous Q12H  . tamsulosin  0.4 mg Oral Daily   Continuous Infusions:      Time spent:  25 minutes    [name], [name]  [facility] Pager  [phone]. If 7PM-7AM, please contact night-coverage at [email or web address], password [facility] 03/23/2015, 1:03 PM  LOS: 4 days

## 2015-03-23 NOTE — Progress Notes (Signed)
CSW continuing to follow.   Pt admitted from [facility] and plan is for pt to return when medically ready. Pt was at [facility] under Letter of Guarantee (LOG).   Per MD, pt not yet medically ready for discharge.  CSW updated [place] [facility].  CSW to continue to follow.   Alison Murray, MSW, [place] Work [phone]

## 2015-03-23 NOTE — Progress Notes (Signed)
Physical Therapy Wound/Hydrotherapy Evaluation Patient Details  Name: [name] MRN: [id number] Date of Birth: 08-24-1950  Today's Date: 03/23/2015 Time:  1220- 1252    Subjective  Subjective: I've had it for a while Patient and Family Stated Goals: less painful  Pain Score: Pain Score: 8/10. (Pre-medicated by RN just prior to hydrotherapy session)  Wound Assessment  Dressing Type Santyl;Moist to dry Gauze;Allevyn (small4x4)  Dressing Changed  Dressing Change Frequency Daily  State of Healing Non-healing  Site / Wound Assessment Yellow     % Wound base Yellow 100%  Peri-wound Assessment Erythema (non-blanchable);Intact  Wound Length (cm) 3 cm  Wound Width (cm) 3 cm  Wound Depth (cm) 0.5 cm  Drainage Amount None (difficult to assess-dressing soiled with feces)  Treatment Hydrotherapy (Pulse lavage)   Hydrotherapy Pulsed lavage therapy - wound location: sacrum Pulsed Lavage with Suction (psi): 8 psi Pulsed Lavage with Suction - Normal Saline Used: 1000 mL Pulsed Lavage Tip: Tip with splash shield   Wound Assessment and Plan  Wound Therapy - Assess/Plan/Recommendations Wound Therapy - Clinical Statement: 65 yo female admitted from SNF with anemia. Hx of sacral wound, DM, HTN, COPD-O2 dep, HF, CKD, PVD, dementia Wound Therapy - Functional Problem List: Impaired mobility, general weakness, decreased activity tolerance Factors Delaying/Impairing Wound Healing: Diabetes Mellitus;Immobility; Incontinence Hydrotherapy Plan: Pulsatile lavage with suction Wound Therapy - Frequency: 6X / week Wound Therapy - Current Recommendations: Case manager/social work Wound Therapy - Follow Up Recommendations: Skilled nursing facility Wound Plan: Faciliate wound healing with daily hydrotherapy, enzymatic debridement, and dressing changes.   Wound Therapy Goals- Improve the function of patient's integumentary system by progressing the wound(s) through the phases of wound healing  (inflammation - proliferation - remodeling) by: Decrease Necrotic Tissue to: 75% Increase Granulation Tissue to: 25% Decrease Length/Width/Depth by (cm): 1 Goals/treatment plan/discharge plan were made with and agreed upon by patient/family: Yes Time For Goal Achievement: 2 weeks Wound Therapy - Potential for Goals: Good  Goals will be updated until maximal potential achieved or discharge criteria met.  Discharge criteria: when goals achieved, discharge from hospital, MD decision/surgical intervention, no progress towards goals, refusal/missing three consecutive treatments without notification or medical reason.  GP     Weston Anna, MPT Pager: [phone]

## 2015-03-23 NOTE — Progress Notes (Signed)
     [facility] Progress Note  Subjective:  Feels ok.  Awaiting EGD/enteroscopy.  No sign of active/overt bleeding.  Objective:  Vital signs in last 24 hours: Temp:  [98.2 F (36.8 C)-98.8 F (37.1 C)] 98.2 F (36.8 C) (01/30 0515) Pulse Rate:  [79-91] 79 (01/30 0515) Resp:  [18-20] 20 (01/30 0515) BP: (121-149)/(41-60) 139/60 mmHg (01/30 0515) SpO2:  [91 %-95 %] 95 % (01/30 0837) Last BM Date: 03/22/15 General:  Alert, Well-developed, in NAD Heart:  Regular rate and rhythm; SEM noted Pulm:  CTAB.  No W/R/R. Abdomen:  Soft, non-distended. Normal bowel sounds.  Non-tender. Extremities:  Without edema. Neurologic:  Alert and oriented x 4;  grossly normal neurologically. Psych:  Alert and cooperative.  Flat affect.  Intake/Output from previous day: 01/29 0701 - 01/30 0700 In: 2158.8 [P.O.:1080; I.V.:1078.8] Out: 1950 [Urine:1950]  Lab Results:  Recent Labs  03/21/15 0541 03/22/15 0521 03/23/15 0530  WBC 10.0 8.5 6.8  HGB 8.7* 8.3* 8.3*  HCT 26.1* 24.8* 25.7*  PLT 361 271 227   BMET  Recent Labs  03/21/15 0541 03/22/15 0521 03/23/15 0530  NA 143 139 138  K 4.4 3.8 3.3*  CL 116* 113* 112*  CO2 17* 15* 14*  GLUCOSE 124* 102* 71  BUN 55* 49* 47*  CREATININE 4.00* 3.47* 3.22*  CALCIUM 6.6* 6.2* 6.0*   LFT  Recent Labs  03/20/15 0850  PROT 6.4*  ALBUMIN 2.4*  AST 17  ALT 12*  ALKPHOS 85  BILITOT 0.4   Assessment / Plan: -65 year old female with multiple medical problems who presented from SNF with abnormal labs including hypocalcemia and acute decrease in Hgb. Stool was heme positive and reports dark stools but is also on iron. Has history of bleeding lesion in jejunum in 2013 that was treated with epi and endoclips. She was on Plavix, but this is now on hold; last dose 1/26. S/p 2 units PRBC's. Hgb stable.  *Continue to monitor Hgb and transfuse further prn. *EGD/enteroscopy after 5 day Plavix washout, which would be tomorrow, 1/31.      LOS: 4 days   ZEHR, JESSICA D.  03/23/2015, 8:39 AM  Pager number [phone]   ________________________________________________________________________  [facility] MD note:  I personally examined the patient, reviewed the data and agree with the assessment and plan described above.  Planning on EGD, enteroscopy tomorrow.  Last plavix was several days ago, no signs of overt bleeding.     Orbach Loffler, MD [facility] Pager [phone]

## 2015-03-23 NOTE — Progress Notes (Signed)
CRITICAL VALUE ALERT  Critical value received:  Calcium 6.0  Date of notification:  03/23/2015  Time of notification:  0630  Critical value read back:Yes.    Nurse who received alert:  L. Vergel de Larkin Ina RN  MD notified (1st page):  L. Harduk PA  Time of first page:  [phone]  MD notified (2nd page):  Time of second page:  Responding MD:  Roger Shelter PA  Time MD responded:  [phone]

## 2015-03-24 ENCOUNTER — Encounter (HOSPITAL_COMMUNITY): Payer: Self-pay | Admitting: Certified Registered Nurse Anesthetist

## 2015-03-24 ENCOUNTER — Encounter (HOSPITAL_COMMUNITY): Payer: Self-pay | Admitting: *Deleted

## 2015-03-24 ENCOUNTER — Encounter (HOSPITAL_COMMUNITY)
Admission: EM | Disposition: A | Discharge status: Home Health | Payer: Self-pay | Source: Home / Self Care | Attending: Internal Medicine

## 2015-03-24 DIAGNOSIS — D500 Iron deficiency anemia secondary to blood loss (chronic): Secondary | ICD-10-CM

## 2015-03-24 DIAGNOSIS — K922 Gastrointestinal hemorrhage, unspecified: Principal | ICD-10-CM

## 2015-03-24 DIAGNOSIS — N179 Acute kidney failure, unspecified: Secondary | ICD-10-CM

## 2015-03-24 DIAGNOSIS — I25119 Atherosclerotic heart disease of native coronary artery with unspecified angina pectoris: Secondary | ICD-10-CM

## 2015-03-24 LAB — BASIC METABOLIC PANEL
ANION GAP: 9 (ref 5–15)
BUN: 53 mg/dL — AB (ref 6–20)
CALCIUM: 6 mg/dL — AB (ref 8.9–10.3)
CO2: 19 mmol/L — ABNORMAL LOW (ref 22–32)
Chloride: 115 mmol/L — ABNORMAL HIGH (ref 101–111)
Creatinine, Ser: 3.3 mg/dL — ABNORMAL HIGH (ref 0.44–1.00)
GFR calc Af Amer: 16 mL/min — ABNORMAL LOW (ref >60)
GFR, EST NON AFRICAN AMERICAN: 14 mL/min — AB (ref >60)
GLUCOSE: 85 mg/dL (ref 65–99)
Potassium: 4.5 mmol/L (ref 3.5–5.1)
SODIUM: 143 mmol/L (ref 135–145)

## 2015-03-24 LAB — GLUCOSE, CAPILLARY
GLUCOSE-CAPILLARY: 66 mg/dL (ref 65–99)
Glucose-Capillary: 77 mg/dL (ref 65–99)
Glucose-Capillary: 74 mg/dL (ref 65–99)
Glucose-Capillary: 109 mg/dL — ABNORMAL HIGH (ref 65–99)

## 2015-03-24 LAB — CBC
HCT: 22.6 % — ABNORMAL LOW (ref 36.0–46.0)
Hemoglobin: 7.4 g/dL — ABNORMAL LOW (ref 12.0–15.0)
MCH: 29.2 pg (ref 26.0–34.0)
MCHC: 32.7 g/dL (ref 30.0–36.0)
MCV: 89.3 fL (ref 78.0–100.0)
PLATELETS: 212 10*3/uL (ref 150–400)
RBC: 2.53 MIL/uL — ABNORMAL LOW (ref 3.87–5.11)
RDW: 15.2 % (ref 11.5–15.5)
WBC: 6.6 10*3/uL (ref 4.0–10.5)

## 2015-03-24 LAB — PREPARE RBC (CROSSMATCH)

## 2015-03-24 LAB — TROPONIN I: Troponin I: 0.05 ng/mL — ABNORMAL HIGH (ref <0.031)

## 2015-03-24 SURGERY — CANCELLED PROCEDURE

## 2015-03-24 MED ORDER — SODIUM CHLORIDE 0.9 % IV SOLN
INTRAVENOUS | Status: DC
  Administered 2015-03-24: 1000 mL via INTRAVENOUS

## 2015-03-24 MED ORDER — ONDANSETRON HCL 4 MG/2ML IJ SOLN
INTRAMUSCULAR | Status: AC
  Filled 2015-03-24: qty 2

## 2015-03-24 MED ORDER — SODIUM CHLORIDE 0.9 % IV SOLN
Freq: Once | INTRAVENOUS | Status: AC
  Administered 2015-03-24: 17:00:00 via INTRAVENOUS

## 2015-03-24 MED ORDER — PROPOFOL 10 MG/ML IV BOLUS
INTRAVENOUS | Status: AC
  Filled 2015-03-24: qty 40

## 2015-03-24 MED ORDER — MORPHINE SULFATE (PF) 2 MG/ML IV SOLN
2.0000 mg | Freq: Every day | INTRAVENOUS | Status: DC | PRN
Start: 2015-03-24 — End: 2015-03-26
  Administered 2015-03-24 – 2015-03-25 (×2): 2 mg via INTRAVENOUS
  Filled 2015-03-24 (×2): qty 1

## 2015-03-24 NOTE — Progress Notes (Signed)
Pt arrived to the floor. Vital signs stable. No complaints of chest pain. Pt had not previously c/o chest pain this morning prior to EGD. Will continue to monitor.

## 2015-03-24 NOTE — Progress Notes (Signed)
CSW continuing to follow.  Pt admitted from [facility] where pt was at as Letter of Guarantee (LOG) and will return when medically ready.  CSW was notified that pt is now on hydrotherapy in the hospital. CSW to clarify prior to pt discharge if hydrotherapy will be needed at facility.   CSW spoke with [facility] RN liaison, Joaquim Nam and facility inquired if pt would be seen by financial counselor to apply for Medicaid while in hospital.  [place] spoke with financial counselor, Vicente Males who was able to set up an appointment for Medicaid worker to meet with pt tomorrow to complete Medicaid application at [id number] am.  CSW discussed with pt husband as pt had just returned from procedure and being assisted by RN. Pt husband expressed understanding about Medicaid worker coming to pt hospital room at 11:30 am tomorrow morning to complete application.  CSW to continue to follow.   Alison Murray, MSW, [place] Work [phone]

## 2015-03-24 NOTE — Progress Notes (Addendum)
[facility] PROGRESS NOTE  [name] [id number] DOB: [birth year] DOA: 03/19/2015 PCP: Pcp Not In System  Brief narrative 65 year old female with multiple comorbidities including COPD with chronic respiratory failure. Nasal cannula, chronic kidney disease stage IV (baseline creatinine of 3), multivessel CAD , chronic diastolic CHF, uncontrolled diabetes mellitus with peripheral neuropathy, peripheral vascular disease, chronic anemia, pressure ulcer, depression, hyperlipidemia, peripheral vascular disease, currently a resident of [facility] was sent from the facility for hypocalcemia. Calcium was 6.4 (corrected 7.9mg /dl). She was recently treated for hypercalcemia during recent hospitalization this month. However further blood work done in the ED showed hemoglobin dropped to 6.4 from 9.6. Stool for Hemoccult was positive. Creatinine was also worsened to 4.15, baseline around 3. Patient is unaware why she was sent to the hospital. Admitted to hospitalist service for further management of her anemia with Hemoccult stool and acute on chronic kidney disease.  Assessment/Plan: Anemia with heme-positive stools Patient has history of gastric and jejunal AVMs 2013 was treated with epi and endoclips.  GI following. EGD and enteroscopy scheduled today (needed to hold plavix for 5 days)   Received 2 units PRBC. Hemoglobin 7.4 today and still having some dark stools. Will transfuse after returning from EGD. Holding aspirin and Plavix. Continue PPI twice a day   Acute on chronic kidney disease stage IV Follows with Dr. Florene Glen. Baseline creatinine around 3. Renal function was worsened during recent hospitalization and was thought to be due to dehydration. Holding diuretics. (Lasix was held during recent hospital discharge due to acute on chronic kidney disease). Slowly improving. Off IV fluids. Avoiding nephrotoxins. Low bicarbonate  replenished.   pseudohypocalcemia Corrected calcium >7.5. Recently treated for hypercalcemia with calcitonin and pamidronate.   Coronary artery disease/peripheral vascular disease Continue Coreg (dose recently changed ). Continue statin and Imdur. Holding aspirin and Plavix  Type 2 diabetes mellitus Stable. Monitor on sliding scale coverage.  Sacral pressure ulcer Has some purulence and patient complains of pain. Wound care consult appreciated. Getting hydrotherapy.  Chronic Diastolic CHF Euvolemic. Continue beta blocker and statin.  Urinary retention  Has been on Foley catheter since his recent discharge on 02/1915. Continue Flomax. Foley discontinued on 1/30 without any further issues.  COPD with chronic respiratory failure On home O2. Stable. Continue when necessary nebs. Continue dulera.  Protein calorie malnutrition. Consulted dietitian.  Hypokalemia  replenished   DVT prophylaxis: SCDs Diet:  Nothing by mouth for EGD today.   CODE STATUS: Full code Family Communication: none at bedside Disposition Plan: Return to skilled nursing facility depending upon EGD and enteroscopy resolved. Possibly in the next 24-48 hours.   Consultants:  [facility]  Procedures:  EGD on 1/31  Antibiotics:   None  HPI/Subjective:  Seen and examined. Reports pain over her sacrum to be better. No overnight issues.  Objective: Filed Vitals:   03/23/15 2123 03/24/15 0414  BP: 138/60 134/58  Pulse: 77 80  Temp: 98 F (36.7 C) 98.9 F (37.2 C)  Resp: 16 16    Intake/Output Summary (Last 24 hours) at 03/24/15 1250 Last data filed at 03/24/15 0759  Gross per 24 hour  Intake    480 ml  Output   1800 ml  Net  -1320 ml   Filed Weights   03/19/15 1934 03/20/15 0033 03/24/15 1109  Weight: 61.236 kg (135 lb) 58.6 kg (129 lb 3 oz) 58.4 kg (128 lb 12 oz)    Exam:   General:   not in distress  HEENT: Pallor present, moist mucosa  Chest: Clear bilaterally  CVS: Normal  S1 and S2, no murmurs   GI: Soft, nondistended, nontender, foley draining clear urine  Musculoskeletal: Warm, no edema, 3 cm sacral ulcer appears clean and scanty discharge  CNS: Alert and oriented  Data Reviewed: Basic Metabolic Panel:  Recent Labs Lab 03/20/15 0850 03/21/15 0541 03/22/15 0521 03/23/15 0530 03/24/15 0530  NA 138 143 139 138 143  K 4.0 4.4 3.8 3.3* 4.5  CL 111 116* 113* 112* 115*  CO2 14* 17* 15* 14* 19*  GLUCOSE 107* 124* 102* 71 85  BUN 48* 55* 49* 47* 53*  CREATININE 4.13* 4.00* 3.47* 3.22* 3.30*  CALCIUM 6.6* 6.6* 6.2* 6.0* 6.0*  MG 1.7  --   --   --   --   PHOS 2.9  --   --   --   --    Liver Function Tests:  Recent Labs Lab 03/19/15 2018 03/20/15 0850  AST 16 17  ALT 12* 12*  ALKPHOS 80 85  BILITOT 0.4 0.4  PROT 6.0* 6.4*  ALBUMIN 2.2* 2.4*   No results for input(s): LIPASE, AMYLASE in the last 168 hours. No results for input(s): AMMONIA in the last 168 hours. CBC:  Recent Labs Lab 03/20/15 0850 03/21/15 0541 03/22/15 0521 03/23/15 0530 03/24/15 0530  WBC 14.0* 10.0 8.5 6.8 6.6  HGB 9.8* 8.7* 8.3* 8.3* 7.4*  HCT 30.0* 26.1* 24.8* 25.7* 22.6*  MCV 92.0 91.3 88.9 91.1 89.3  PLT 390 361 271 227 212   Cardiac Enzymes: No results for input(s): CKTOTAL, CKMB, CKMBINDEX, TROPONINI in the last 168 hours. BNP (last 3 results)  Recent Labs  11/07/14 0325 12/24/14 1030 01/21/15 2041  BNP 2634.2* 1288.9* 833.4*    ProBNP (last 3 results) No results for input(s): PROBNP in the last 8760 hours.  CBG:  Recent Labs Lab 03/23/15 1306 03/23/15 1719 03/23/15 2348 03/24/15 0710 03/24/15 1212  GLUCAP 73 101* 110* 77 74    No results found for this or any previous visit (from the past 240 hour(s)).   Studies: No results found.  Scheduled Meds: . atorvastatin  80 mg Oral q1800  . carvedilol  3.125 mg Oral BID WC  . collagenase   Topical Daily  . isosorbide mononitrate  30 mg Oral Daily  . mometasone-formoterol  2 puff  Inhalation BID  . pantoprazole (PROTONIX) IV  40 mg Intravenous Q12H  . sertraline  50 mg Oral Daily  . sodium bicarbonate  650 mg Oral BID  . sodium chloride flush  3 mL Intravenous Q12H  . tamsulosin  0.4 mg Oral Daily   Continuous Infusions: . sodium chloride        Time spent:  25 minutes    Torrin Frein, [place]  [facility] Pager  [phone]. If 7PM-7AM, please contact night-coverage at [email or web address], password [facility] 03/24/2015, 12:50 PM  LOS: 5 days

## 2015-03-24 NOTE — Progress Notes (Signed)
Hypoglycemic Event  CBG: 66  Treatment: 15 GM carbohydrate snack  Symptoms: None  Follow-up CBG: Time:0015 CBG Result:86  Possible Reasons for Event: Inadequate meal intake  Comments/MD notified:    Arville Lime

## 2015-03-24 NOTE — Progress Notes (Signed)
Physical Therapy Wound Treatment Patient Details  Name: [name] MRN: [id number] Date of Birth: Jun 28, 1950  Today's Date: 03/24/2015 Time:  1119- 1133    Subjective  Subjective:  (i'm okay)  Pain Score: Pain Score: 3 (pre-medicated)  Wound Assessment  Dressing Type Santyl;Moist to dry;Allevyn  Dressing Old drainage;Changed  Dressing Change Frequency Daily  State of Healing Non-healing  Site / Wound Assessment Yellow  % Wound base Yellow 100%  Peri-wound Assessment Erythema;Intact;Maceration  Wound Length (cm) 3 cm  Wound Width (cm) 3 cm  Wound Depth (cm) 0.5 cm  Drainage Amount Minimal  Drainage Description Serous  Treatment Hydrotherapy (Pulse lavage)   Hydrotherapy Pulsed lavage therapy - wound location: sacrum Pulsed Lavage with Suction (psi): 8 psi Pulsed Lavage with Suction - Normal Saline Used: 1000 mL Pulsed Lavage Tip: Tip with splash shield   Wound Assessment and Plan  Wound Therapy - Assess/Plan/Recommendations Wound Therapy - Clinical Statement: 65 yo female admitted from SNF with anemia. Hx of DM, HTN, COPD, HF, CKD, PVD, dementia Wound Therapy - Functional Problem List: Impaired mobility, general weakness, decreased activity tolerance Factors Delaying/Impairing Wound Healing: Diabetes Mellitus;Immobility; Incontinence Hydrotherapy Plan: Pulsatile lavage with suction Wound Therapy - Frequency: 6X / week Wound Therapy - Current Recommendations: Case manager/social work Wound Therapy - Follow Up Recommendations: Skilled nursing facility Wound Plan: Faciliate wound healing with daily hydrotherapy, enzymatic debridement, and dressing changes.   Wound Therapy Goals- Improve the function of patient's integumentary system by progressing the wound(s) through the phases of wound healing (inflammation - proliferation - remodeling) by: Decrease Necrotic Tissue - Progress: Progressing toward goal Increase Granulation Tissue - Progress: Progressing toward  goal Decrease Length/Width/Depth - Progress: Progressing toward goal Goals/treatment plan/discharge plan were made with and agreed upon by patient/family: Yes Time For Goal Achievement: 2 weeks Wound Therapy - Potential for Goals: Good  Goals will be updated until maximal potential achieved or discharge criteria met.  Discharge criteria: when goals achieved, discharge from hospital, MD decision/surgical intervention, no progress towards goals, refusal/missing three consecutive treatments without notification or medical reason.  GP     Weston Anna, MPT Pager: [phone]

## 2015-03-24 NOTE — Progress Notes (Signed)
Patient presented to the holding area for her procedure and when interviewing the patient, she endorsed chest pain. She reports that she has been having this on and off all morning and it is a different quality, dull and heavy in nature, than the chest pain she has experienced prior. In looking through her chart, it does not appear that cardiology had seen her since her admission. She was actually supposed to have close 2 week follow up with cardiology from December but due to her hospitalization, has not had that. She has been off of her aspirin and plavix for 6 days as well. I discussed all of this with Dr. Ardis Hughs and we both agree that getting cardiology to evaluate her for this new onset chest pain in the setting of severe CAD and for clearance for her up coming procedure would be the best thing for this patient. I have attempted to contact her internal medicine physician to discuss this but have not heard back yet. I went ahead and consulted cardiology, and they are coming to see her at [facility].

## 2015-03-24 NOTE — Consult Note (Signed)
CARDIOLOGY CONSULT NOTE      Patient ID: [name] MRN: [id number] DOB/AGE: 65-21-1952 65 y.o.  Admit date: 03/19/2015 Referring PhysicianNishant Dhungel, MD Primary PhysicianPcp Not In System Primary Cardiologist Nishan Reason for Consultation chest pain  HPI: 65 year old woman with known coronary artery disease and peripheral vascular disease. She last underwent cardiac evaluation in December 2016. At that time, she had an abnormal nuclear study. This prompted a cardiac cath. She was found to have an occluded right coronary artery with left to right collaterals. There was moderate to severe distal left main and ostial LAD disease as well. A surgical consultation was obtained. Due to her chronic lung disease and the fact that her left-sided disease had not changed much since the previous cardiac cath, medical therapy was the treatment of choice. Her disease at the left main LAD circumflex bifurcation was not thought to be amenable to PCI.  As part of her medical therapy, she was treated with dual antiplatelet therapy.  She is a resident at [facility] skilled nursing facility. Due to severe hypocalcemia, she was sent to the emergency room. Blood work done in the emergency room showed that her hemoglobin had dropped from 9.6-6.4. Hemoccult test was positive. She was also in acute renal failure with creatinine up to greater than 4.  As part of the anemia workup, GI was consulted. Endoscopy was planned after the patient had 5 days off of Plavix. On the day of the procedure, anesthesiology spoke to the patient. The patient mentioned that she had some chest discomfort. The procedure was then canceled and cardiology consultation was obtained.  The patient reports that she has a sharp chest pain, typically at night. It is in the center of her chest. It lasts about a minute and goes away spontaneously. She has not had to take any nitroglycerin. There are times that she walks and she can  have pain and other times when she walks and there is no pain. She is limited mostly by shortness of breath. She is on home oxygen. She has stopped smoking since December 2016.  Currently, she feels well. She denies any discomfort.  Review of systems complete and found to be negative unless listed above   Past Medical History  Diagnosis Date  . Peripheral vascular disease ([place])     a.s /p left common iliac and external iliac artery stents. b. H/o subclavian, mesenteric, and celiac artery stenosis (see below).  . Diabetes mellitus   . COPD (chronic obstructive pulmonary disease) (HCC)     home oxygen  . Hyperlipidemia   . Depression   . Chronic diastolic CHF (congestive heart failure) ([place])   . Essential hypertension   . Renal artery stenosis ([place])     a. Duplex 2014: no obvious evidence of hemodynamically significant stenosis >60%, bilateral intrarenal arteries exhibit absent diastolic flow. There is evidence of elevated velocities of the mid aorta. 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  [name] CAD (coronary artery disease)   . Iron deficiency anemia   . Diabetic retinopathy ([place])   . 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 Carotid artery disease ([place])     a. Duplex 12/2014: left CEA patent with elevated velocities, 60-79% RICA (stable over serial exams), [id number] LICA stenosis (stable over serial exams), 50% LECA, elevated velocities in bilateral subclavian arteries.  . Hypertensive heart disease with  congestive heart failure ([place])   . CKD (chronic kidney disease), stage III   . Tobacco abuse   . Bradycardia     a. Limiting BB titration.  . Anemia, chronic disease 05/16/2008    Qualifier: Diagnosis of  By: Percival Spanish, MD, Farrel Gordon    . Dementia   . Protein-calorie malnutrition ([place]) 01/24/2015    Family History  Problem Relation Age of Onset  .  Hypertension Mother   . Heart attack Mother   . Hypertension Father   . Angina Father   . Cancer Sister   . Cancer Brother   . Stroke Neg Hx     Social History   Social History  . Marital Status: Married    Spouse Name: N/A  . Number of Children: N/A  . Years of Education: N/A   Occupational History  . Not on file.   Social History Main Topics  . Smoking status: Former Smoker -- 0.00 packs/day for 36 years    Types: Cigarettes    Quit date: 02/11/2015  . Smokeless tobacco: Never Used     Comment: uses e-cig  . Alcohol Use: No  . Drug Use: No  . Sexual Activity: No   Other Topics Concern  . Not on file   Social History Narrative    Past Surgical History  Procedure Laterality Date  . Carotid endarterectomy    . Cholecystectomy    . Appendectomy    . Tonsillectomy    . Cesarean section    . Shoulder arthroscopy    . Iliac artery stent      left  . Enteroscopy  01/26/2012    Procedure: ENTEROSCOPY;  Surgeon: Gatha Mayer, MD;  Location: [facility];  Service: Endoscopy;  Laterality: N/A;  . Cardiac catheterization N/A 01/23/2015    Procedure: Left Heart Cath and Coronary Angiography;  Surgeon: [name], MD;  Location: [place] [facility];  Service: Cardiovascular;  Laterality: N/A;     Prescriptions prior to admission  Medication Sig Dispense Refill Last Dose  . amLODipine (NORVASC) 10 MG tablet Take 1 tablet (10 mg total) by mouth daily. 30 tablet 2 03/19/2015 at 0800  . aspirin EC 81 MG tablet Take 1 tablet (81 mg total) by mouth daily. 30 tablet 2 03/19/2015 at 1000  . atorvastatin (LIPITOR) 80 MG tablet Take 1 tablet (80 mg total) by mouth daily at 6 PM. 30 tablet 2 03/18/2015 at 1800  . carvedilol (COREG) 3.125 MG tablet Take 1 tablet (3.125 mg total) by mouth 2 (two) times daily with a meal.   03/19/2015 at 0800  . clopidogrel (PLAVIX) 75 MG tablet Take 1 tablet (75 mg total) by mouth daily. 30 tablet 2 03/18/2015 at 2100  . famotidine (PEPCID) 20 MG  tablet Take 1 tablet (20 mg total) by mouth daily.   03/19/2015 at 1000  . Ferrous Sulfate (IRON) 325 (65 FE) MG TABS Take 325 mg by mouth 3 (three) times daily. 90 each 2 03/19/2015 at 1000  . fish oil-omega-3 fatty acids 1000 MG capsule Take 1 g by mouth 3 (three) times daily. Three times daily   03/19/2015 at 1000  . Fluticasone Furoate-Vilanterol (BREO ELLIPTA) 100-25 MCG/INH AEPB Inhale 1 Inhaler into the lungs daily. 1 each 2 03/19/2015 at 1000  . hydrALAZINE (APRESOLINE) 50 MG tablet Take 1 tablet (50 mg total) by mouth every 8 (eight) hours.   03/19/2015 at 1000  . hydrocortisone 2.5 % lotion Apply topically 2 (two) times daily.  59 mL 2 03/19/2015 at 1000  . insulin aspart (NOVOLOG) 100 UNIT/ML injection Before each meal 3 times a day, 140-199 - 2 units, 200-250 - 4 units, 251-299 - 6 units,  300-349 - 8 units,  350 or above 10 units. Dispense syringes and needles as needed, Ok to switch to PEN if approved. Substitute to any brand approved. DX DM2, Code E11.65 1 vial 12 03/19/2015 at 1130  . isosorbide mononitrate (IMDUR) 30 MG 24 hr tablet Take 1 tablet (30 mg total) by mouth daily. 30 tablet 2 03/19/2015 at 1000  . mometasone-formoterol (DULERA) 100-5 MCG/ACT AERO Inhale 1 puff into the lungs daily. 1 Inhaler 2 03/19/2015 at 1000  . sertraline (ZOLOFT) 50 MG tablet Take 1 tablet (50 mg total) by mouth daily. 30 tablet 3 03/19/2015 at 0800  . tamsulosin (FLOMAX) 0.4 MG CAPS capsule Take 1 capsule (0.4 mg total) by mouth daily. 30 capsule  03/19/2015 at 1000  . albuterol (PROVENTIL HFA;VENTOLIN HFA) 108 (90 BASE) MCG/ACT inhaler Inhale 2 puffs into the lungs every 6 (six) hours as needed for wheezing or shortness of breath. 1 Inhaler 2 unknown at unknown time  . cephALEXin (KEFLEX) 250 MG capsule Take 1 capsule (250 mg total) by mouth daily. X 3 more days (Patient not taking: Reported on 03/19/2015) 3 capsule 0 Completed Course at Unknown time  . nitroGLYCERIN (NITROSTAT) 0.4 MG SL tablet Place 1 tablet  (0.4 mg total) under the tongue every 5 (five) minutes as needed for chest pain. 10 tablet 0 unknown    Physical Exam: Vitals:   Filed Vitals:   03/24/15 0838 03/24/15 1109 03/24/15 1325 03/24/15 1405  BP:   97/48 118/44  Pulse:   68 71  Temp:   98.1 F (36.7 C) 99.2 F (37.3 C)  TempSrc:   Oral Oral  Resp:   11 12  Height:      Weight:  128 lb 12 oz (58.4 kg)    SpO2: 96%  97% 97%   I&O's:   Intake/Output Summary (Last 24 hours) at 03/24/15 1513 Last data filed at 03/24/15 0759  Gross per 24 hour  Intake    240 ml  Output   1200 ml  Net   -960 ml   Physical exam: [place]/AT EOMI No JVD, No carotid bruit RRR S1S2  No wheezing Soft. NT, nondistended No edema., Mild bruising on arms No focal motor or sensory deficits Normal affect  Labs:   Lab Results  Component Value Date   WBC 6.6 03/24/2015   HGB 7.4* 03/24/2015   HCT 22.6* 03/24/2015   MCV 89.3 03/24/2015   PLT 212 03/24/2015    Recent Labs Lab 03/20/15 0850  03/24/15 0530  NA 138  < > 143  K 4.0  < > 4.5  CL 111  < > 115*  CO2 14*  < > 19*  BUN 48*  < > 53*  CREATININE 4.13*  < > 3.30*  CALCIUM 6.6*  < > 6.0*  PROT 6.4*  --   --   BILITOT 0.4  --   --   ALKPHOS 85  --   --   ALT 12*  --   --   AST 17  --   --   GLUCOSE 107*  < > 85  < > = values in this interval not displayed. Lab Results  Component Value Date   CKTOTAL 23 11/05/2009   CKMB 0.6 11/05/2009   TROPONINI <0.03 01/22/2015  Lab Results  Component Value Date   CHOL 261* 01/24/2015   CHOL 216* 05/17/2012   CHOL 258* 01/17/2011   Lab Results  Component Value Date   HDL 33* 01/24/2015   HDL 46.40 05/17/2012   HDL 45.60 01/17/2011   Lab Results  Component Value Date   LDLCALC 173* 01/24/2015   [place] See Comment mg/dL 04/13/2010   LDLCALC  11/05/2009    55        Total Cholesterol/HDL:CHD Risk Coronary Heart Disease Risk Table                     Men   Women  1/2 Average Risk   3.4   3.3  Average Risk       5.0    4.4  2 X Average Risk   9.6   7.1  3 X Average Risk  23.4   11.0        Use the calculated Patient Ratio above and the CHD Risk Table to determine the patient's CHD Risk.        ATP III CLASSIFICATION (LDL):  <100     mg/dL   Optimal  100-129  mg/dL   Near or Above                    Optimal  130-159  mg/dL   Borderline  160-189  mg/dL   High  >190     mg/dL   Very High   Lab Results  Component Value Date   TRIG 274* 01/24/2015   TRIG 133.0 05/17/2012   TRIG 327.0* 01/17/2011   Lab Results  Component Value Date   CHOLHDL 7.9 01/24/2015   CHOLHDL 5 05/17/2012   CHOLHDL 6 01/17/2011   Lab Results  Component Value Date   LDLDIRECT 133.6 05/17/2012   LDLDIRECT 155.9 01/17/2011      Radiology: EKG: Normal sinus rhythm, nonspecific ST segment changes; ST segment changes are less pronounced now compared to ECG from earlier in January 2017  ASSESSMENT AND PLAN:  Principal Problem:   Anemia Active Problems:   GI bleed   Diabetes mellitus (HCC)   CHF (congestive heart failure) (HCC)   Essential hypertension   AKI (acute kidney injury) ([place])   CKD (chronic kidney disease)   Hypocalcemia   Absolute anemia   Sacral ulcer   Urinary retention  Chest discomfort: Several atypical features to chest pain. However, the patient has known coronary artery disease. Minimally elevated troponin on most recent check. I don't think that single troponin elevation represents acute coronary syndrome given her acute renal failure. Can cycle enzymes and if troponins remained flat, we can safely assume that this is not an acute event. ECG also does not reveal acute changes. Obviously cannot heparinize the patient given the GI bleeding.  In regards to her procedure, her risk is likely higher than the average patient, but it does not appear that there is anything we can do to modify that risk. She is not a candidate for open heart surgery. She is not a candidate for cardiac cath or percutaneous  intervention given her bleeding and renal failure. She will have to be managed medically.  I did speak at length with Dr. Ardis Hughs. I think it would be reasonable to have the patient continue with clopidogrel monotherapy and hold aspirin in an attempt to prevent GI bleeding in the future.  Continue to transfuse as needed for symptomatic anemia.  Signed:   Mila Palmer.  Irish Lack, MD, [facility] 03/24/2015, 3:13 PM

## 2015-03-24 NOTE — Progress Notes (Signed)
Enteroscopy cancelled 03/24/15 Per Lauretta Grill, MD due to patient needs cardiac clearance. 12 lead EKG done in pre procedure per Dr. Jillyn Hidden verbal order. Dr. Ardis Hughs aware of need for case to be cancelled.

## 2015-03-24 NOTE — Progress Notes (Signed)
Patient was supposed to have EGD/enteroscopy today to evaluate anemia and heme positive stool but this was cancelled due to complaints of chest pain and a slightly elevated troponin.  Cardiology evaluated the patient and spoke with Dr. Ardis Hughs at length.  Will plan to proceed tomorrow, 2/1, unless something else changes between now and then.

## 2015-03-25 ENCOUNTER — Inpatient Hospital Stay (HOSPITAL_COMMUNITY): Payer: Medicaid Other | Admitting: Anesthesiology

## 2015-03-25 ENCOUNTER — Encounter (HOSPITAL_COMMUNITY): Payer: Self-pay

## 2015-03-25 ENCOUNTER — Encounter (HOSPITAL_COMMUNITY)
Admission: EM | Disposition: A | Discharge status: Home Health | Payer: Self-pay | Source: Home / Self Care | Attending: Internal Medicine

## 2015-03-25 ENCOUNTER — Inpatient Hospital Stay (HOSPITAL_COMMUNITY): Payer: Medicaid Other

## 2015-03-25 DIAGNOSIS — N183 Chronic kidney disease, stage 3 (moderate): Secondary | ICD-10-CM

## 2015-03-25 DIAGNOSIS — J449 Chronic obstructive pulmonary disease, unspecified: Secondary | ICD-10-CM

## 2015-03-25 DIAGNOSIS — I25119 Atherosclerotic heart disease of native coronary artery with unspecified angina pectoris: Secondary | ICD-10-CM | POA: Insufficient documentation

## 2015-03-25 DIAGNOSIS — I10 Essential (primary) hypertension: Secondary | ICD-10-CM

## 2015-03-25 DIAGNOSIS — I701 Atherosclerosis of renal artery: Secondary | ICD-10-CM

## 2015-03-25 DIAGNOSIS — R339 Retention of urine, unspecified: Secondary | ICD-10-CM

## 2015-03-25 DIAGNOSIS — E1122 Type 2 diabetes mellitus with diabetic chronic kidney disease: Secondary | ICD-10-CM

## 2015-03-25 DIAGNOSIS — N184 Chronic kidney disease, stage 4 (severe): Secondary | ICD-10-CM

## 2015-03-25 DIAGNOSIS — L89153 Pressure ulcer of sacral region, stage 3: Secondary | ICD-10-CM

## 2015-03-25 DIAGNOSIS — I5032 Chronic diastolic (congestive) heart failure: Secondary | ICD-10-CM

## 2015-03-25 HISTORY — PX: ENTEROSCOPY: SHX5533

## 2015-03-25 LAB — TROPONIN I
Troponin I: 0.04 ng/mL — ABNORMAL HIGH (ref <0.031)
Troponin I: 0.05 ng/mL — ABNORMAL HIGH (ref <0.031)

## 2015-03-25 LAB — BASIC METABOLIC PANEL
ANION GAP: 11 (ref 5–15)
BUN: 46 mg/dL — AB (ref 6–20)
CHLORIDE: 111 mmol/L (ref 101–111)
CO2: 18 mmol/L — AB (ref 22–32)
Calcium: 5.8 mg/dL — CL (ref 8.9–10.3)
Creatinine, Ser: 3.2 mg/dL — ABNORMAL HIGH (ref 0.44–1.00)
GFR calc Af Amer: 17 mL/min — ABNORMAL LOW (ref >60)
GFR, EST NON AFRICAN AMERICAN: 14 mL/min — AB (ref >60)
GLUCOSE: 104 mg/dL — AB (ref 65–99)
POTASSIUM: 4.1 mmol/L (ref 3.5–5.1)
Sodium: 140 mmol/L (ref 135–145)

## 2015-03-25 LAB — TYPE AND SCREEN
ABO/RH(D): O POS
Antibody Screen: NEGATIVE
Unit division: 0

## 2015-03-25 LAB — GLUCOSE, CAPILLARY
GLUCOSE-CAPILLARY: 87 mg/dL (ref 65–99)
GLUCOSE-CAPILLARY: 96 mg/dL (ref 65–99)
Glucose-Capillary: 83 mg/dL (ref 65–99)
Glucose-Capillary: 142 mg/dL — ABNORMAL HIGH (ref 65–99)

## 2015-03-25 LAB — CALCITRIOL (1,25 DI-OH VIT D): VIT D 1 25 DIHYDROXY: 7.2 pg/mL — AB (ref 19.9–79.3)

## 2015-03-25 LAB — CBC
HEMATOCRIT: 27.5 % — AB (ref 36.0–46.0)
HEMOGLOBIN: 8.9 g/dL — AB (ref 12.0–15.0)
MCH: 29.9 pg (ref 26.0–34.0)
MCHC: 32.4 g/dL (ref 30.0–36.0)
MCV: 92.3 fL (ref 78.0–100.0)
Platelets: 189 10*3/uL (ref 150–400)
RBC: 2.98 MIL/uL — AB (ref 3.87–5.11)
RDW: 15.6 % — ABNORMAL HIGH (ref 11.5–15.5)
WBC: 8.2 10*3/uL (ref 4.0–10.5)

## 2015-03-25 LAB — BRAIN NATRIURETIC PEPTIDE: B NATRIURETIC PEPTIDE 5: 524.8 pg/mL — AB (ref 0.0–100.0)

## 2015-03-25 SURGERY — ENTEROSCOPY
Anesthesia: Monitor Anesthesia Care

## 2015-03-25 MED ORDER — SODIUM CHLORIDE 0.9 % IV SOLN: INTRAVENOUS | Status: DC

## 2015-03-25 MED ORDER — LACTATED RINGERS IV SOLN
INTRAVENOUS | Status: DC | PRN
  Administered 2015-03-25: 10:00:00 via INTRAVENOUS

## 2015-03-25 MED ORDER — GUAIFENESIN ER 600 MG PO TB12
600.0000 mg | ORAL_TABLET | Freq: Two times a day (BID) | ORAL | Status: DC
  Administered 2015-03-25 – 2015-03-26 (×2): 600 mg via ORAL
  Filled 2015-03-25 (×2): qty 1

## 2015-03-25 MED ORDER — LACTATED RINGERS IV SOLN: INTRAVENOUS | Status: DC

## 2015-03-25 MED ORDER — FUROSEMIDE 10 MG/ML IJ SOLN
40.0000 mg | Freq: Once | INTRAMUSCULAR | Status: AC
  Administered 2015-03-25: 40 mg via INTRAVENOUS
  Filled 2015-03-25: qty 4

## 2015-03-25 MED ORDER — PROPOFOL 10 MG/ML IV BOLUS
INTRAVENOUS | Status: DC | PRN
  Administered 2015-03-25 (×3): 50 mg via INTRAVENOUS

## 2015-03-25 MED ORDER — BENZONATATE 100 MG PO CAPS: 100.0000 mg | ORAL_CAPSULE | Freq: Three times a day (TID) | ORAL | Status: DC | PRN

## 2015-03-25 MED ORDER — PROPOFOL 10 MG/ML IV BOLUS
INTRAVENOUS | Status: AC
  Filled 2015-03-25: qty 40

## 2015-03-25 NOTE — Brief Op Note (Signed)
03/19/2015 - 03/25/2015  10:33 AM  PATIENT:  [name]  65 y.o. female  PRE-OPERATIVE DIAGNOSIS:  Anemia; black, heme positive stools  POST-OPERATIVE DIAGNOSIS:  same  PROCEDURE:  Procedure(s): ENTEROSCOPY (N/A)  SURGEON:  Surgeon(s) and Role:    Doran Stabler, MD - Primary  Scope to proximal jejunum  Meds: MAC   Findings:  normal esophagus except moderate sized hiatal hernia Stomach:  Single diminutive aspirin erosion (non-bleeding).  No AVMs Duodenum: normal - no avm or bleeding source Proximal jejunum: normal, no avm or bleeding source  Imp: Patient probably has more distal SB ASA-induced ulcer or AVMs (beyond the reach of the scope)  Plan: conservative management with chronic iron supplementation, outpatient PCP monitor of Hgb and iron levels, stay off aspirin and resume plavix. No further endoscopic procedures are planned.

## 2015-03-25 NOTE — Interval H&P Note (Signed)
History and Physical Interval Note:  03/25/2015 10:08 AM  [name]  has presented today for surgery, with the diagnosis of Anemia; black, heme positive stools  The various methods of treatment have been discussed with the patient and family. After consideration of risks, benefits and other options for treatment, the patient has consented to  Procedure(s): ENTEROSCOPY (N/A) as a surgical intervention .  The patient's history has been reviewed, patient examined, no change in status, stable for surgery.  I have reviewed the patient's chart and labs.  Questions were answered to the patient's satisfaction.     The patient was interviewed and examined in the preop area.    Nelida Meuse III

## 2015-03-25 NOTE — H&P (View-Only) (Signed)
Patient was supposed to have EGD/enteroscopy today to evaluate anemia and heme positive stool but this was cancelled due to complaints of chest pain and a slightly elevated troponin.  Cardiology evaluated the patient and spoke with Dr. Ardis Hughs at length.  Will plan to proceed tomorrow, 2/1, unless something else changes between now and then.

## 2015-03-25 NOTE — Care Management Note (Deleted)
Case Management Note  Patient Details  Name: [name] MRN: [id number] Date of Birth: 1950-04-10  Subjective/Objective:    Pt admitted with anemia                Action/Plan: Plan to dc home with [facility]   Expected Discharge Date:                  Expected Discharge Plan:  [place]  In-House Referral:     Discharge planning Services  CM Consult  Post Acute Care Choice:    Choice offered to:  Patient  DME Arranged:    DME Agency:     HH Arranged:  PT, OT HH Agency:  [place]  Status of Service:  Completed, signed off  Medicare Important Message Given:    Date Medicare IM Given:    Medicare IM give by:    Date Additional Medicare IM Given:    Additional Medicare Important Message give by:     If discussed at [place] of Stay Meetings, dates discussed:    Additional CommentsPurcell Mouton, RN 03/25/2015, 3:08 PM

## 2015-03-25 NOTE — Transfer of Care (Signed)
Immediate Anesthesia Transfer of Care Note  Patient: [name]  Procedure(s) Performed: Procedure(s): ENTEROSCOPY (N/A)  Patient Location: PACU  Anesthesia Type:MAC  Level of Consciousness:  sedated, patient cooperative and responds to stimulation  Airway & Oxygen Therapy:Patient Spontanous Breathing and Patient connected to nasal cannula  Post-op Assessment:  Report given to PACU RN and Post -op Vital signs reviewed and stable  Post vital signs:  Reviewed and stable  Last Vitals:  Filed Vitals:   03/25/15 0521 03/25/15 0944  BP: 149/66 188/54  Pulse: 78 78  Temp: 36.7 C   Resp: 16 14    Complications: No apparent anesthesia complications

## 2015-03-25 NOTE — Progress Notes (Addendum)
Pt selected [facility] for [facility].   Referral given to in house rep.

## 2015-03-25 NOTE — Progress Notes (Signed)
Physical Therapy Wound Treatment Patient Details  Name: [name] MRN: [id number] Date of Birth: 07-30-1950  Today's Date: 03/25/2015 Time: 23 - 1541    Subjective  Subjective: it hurts Patient and Family Stated Goals: less painful  Pain Score: Pain Score: 4 (pt requested pain meds after)  Wound Assessment  Dressing Type Santyl; Wet to Dry: Allevyn  Dressing No dressing in place  Dressing Type Gauze (Comment);Moist to dry  Dressing Clean;Dry;Intact  Dressing Change Frequency Daily  State of Healing Non-healing  % Wound base Yellow 100%  Peri-wound Assessment Erythema ;Intact  Wound Length (cm) 3 cm  Wound Width (cm) 3 cm  Wound Depth (cm) 0.5 cm  Drainage Amount None (no dressing in place)  Treatment Hydrotherapy (Pulse lavage)   Hydrotherapy Pulsed lavage therapy - wound location: sacrum Pulsed Lavage with Suction (psi): 8 psi Pulsed Lavage with Suction - Normal Saline Used: 1000 mL Pulsed Lavage Tip: Tip with splash shield   Wound Assessment and Plan  Wound Therapy - Assess/Plan/Recommendations Wound Therapy - Clinical Statement: 65 yo female admitted from SNF with anemia. Hx of DM, HTN, COPD, HF, CKD, PVD, dementia Wound Therapy - Functional Problem List: Impaired mobility, general weakness, decreased activity tolerance Factors Delaying/Impairing Wound Healing: Diabetes Mellitus;Immobility Hydrotherapy Plan: Pulsatile lavage with suction Wound Therapy - Frequency: 6X / week Wound Therapy - Current Recommendations: Case manager/social work Wound Therapy - Follow Up Recommendations: Skilled nursing facility Wound Plan: Faciliate wound healing with daily hydrotherapy, enzymatic debridement, and dressing changes.   Wound Therapy Goals- Improve the function of patient's integumentary system by progressing the wound(s) through the phases of wound healing (inflammation - proliferation - remodeling) by: Decrease Necrotic Tissue - Progress: Progressing toward  goal Increase Granulation Tissue - Progress: Progressing toward goal Decrease Length/Width/Depth - Progress: Progressing toward goal Goals/treatment plan/discharge plan were made with and agreed upon by patient/family: Yes Time For Goal Achievement: 2 weeks Wound Therapy - Potential for Goals: Good  Goals will be updated until maximal potential achieved or discharge criteria met.  Discharge criteria: when goals achieved, discharge from hospital, MD decision/surgical intervention, no progress towards goals, refusal/missing three consecutive treatments without notification or medical reason.  GP     Weston Anna, MPT Pager: [phone]

## 2015-03-25 NOTE — Anesthesia Preprocedure Evaluation (Signed)
Anesthesia Evaluation  Patient identified by MRN, date of birth, ID band Patient awake    Reviewed: Allergy & Precautions, NPO status , Patient's Chart, lab work & pertinent test results  History of Anesthesia Complications Negative for: history of anesthetic complications  Airway Mallampati: II  TM Distance: >3 FB Neck ROM: Full    Dental no notable dental hx. (+) Poor Dentition   Pulmonary COPD,  COPD inhaler, former smoker,    Pulmonary exam normal breath sounds clear to auscultation       Cardiovascular hypertension, Pt. on medications + CAD, + Peripheral Vascular Disease and +CHF  Normal cardiovascular exam Rhythm:Regular Rate:Normal  Presented yesterday for endo and having chest pain, case cancelled and evaluated by cardiology. EKG unchanged and cardiology reports no additional means to further optimize the patient. Patient is anemic and in need of scope. Not endorsing chest pain at this time. Cleared by cardiology for procedure.   Neuro/Psych PSYCHIATRIC DISORDERS Anxiety Depression negative neurological ROS     GI/Hepatic negative GI ROS, Neg liver ROS,   Endo/Other  diabetes  Renal/GU Renal disease  negative genitourinary   Musculoskeletal negative musculoskeletal ROS (+)   Abdominal   Peds negative pediatric ROS (+)  Hematology  (+) anemia ,   Anesthesia Other Findings   Reproductive/Obstetrics negative OB ROS                             Anesthesia Physical Anesthesia Plan  ASA: IV  Anesthesia Plan: MAC   Post-op Pain Management:    Induction: Intravenous  Airway Management Planned: Nasal Cannula  Additional Equipment:   Intra-op Plan:   Post-operative Plan:   Informed Consent: I have reviewed the patients History and Physical, chart, labs and discussed the procedure including the risks, benefits and alternatives for the proposed anesthesia with the patient or  authorized representative who has indicated his/her understanding and acceptance.   Dental advisory given  Plan Discussed with: CRNA  Anesthesia Plan Comments:         Anesthesia Quick Evaluation

## 2015-03-25 NOTE — Plan of Care (Signed)
[facility] PLAN OF CARE NOTE  Patient: [name] [id number]   PCP: Pcp Not In System DOB: 1950/09/02   DOA: 03/19/2015   DOS: 03/25/2015    Plan of care: Patient of shortness of breath with increase in respiratory distress. Oxygen requirement increased as well as bilateral crackles. Chest X shows bilateral congestion. Will check BNP. After device and Mucinex. We give low-dose Lasix given her renal function. Monitor ins and outs and daily weight.  Author: Berle Mull, MD [facility] Pager: [phone] 03/25/2015 6:59 PM   If 7PM-7AM, please contact night-coverage at [email or web address], password [facility]

## 2015-03-25 NOTE — Anesthesia Postprocedure Evaluation (Signed)
Anesthesia Post Note  Patient: [name]  Procedure(s) Performed: Procedure(s) (LRB): ENTEROSCOPY (N/A)  Patient location during evaluation: PACU Anesthesia Type: MAC Level of consciousness: awake and alert Pain management: pain level controlled Vital Signs Assessment: post-procedure vital signs reviewed and stable Respiratory status: spontaneous breathing, nonlabored ventilation, respiratory function stable and patient connected to nasal cannula oxygen Cardiovascular status: blood pressure returned to baseline and stable Postop Assessment: no signs of nausea or vomiting Anesthetic complications: no    Last Vitals:  Filed Vitals:   03/25/15 1050 03/25/15 1123  BP: 153/48 125/59  Pulse:  76  Temp:  36.8 C  Resp: 16 17    Last Pain:  Filed Vitals:   03/25/15 1123  PainSc: 0-No pain                 Leniya Breit JENNETTE

## 2015-03-25 NOTE — Hospital Discharge Follow-Up (Signed)
Transitional Care Clinic Care Coordination Note:  Admit date:  03/19/15 Discharge date: TBD Discharge Disposition: Home with [facility] services (PT, OT) with [place]. Patient contact: [phone] Emergency contact(s): [phone] (Tonya-daughter)  This Case Manager reviewed patient's EMR and determined patient would benefit from post-discharge medical management and chronic care management services through the [facility]. Patient has a history of diabetes mellitus, chronic kidney disease, COPD on home oxygen, CAD, CHF, peripheral vascular disease, HTN.  Admitted for anemia, recurrent GI bleed. Patient has had five inpatient admissions in the last six months. Patient known to the [facility] at [facility] and [facility]. This Case Manager spoke with patient to determine if patient would like to receive medical management and follow-up with the [facility] after discharge. Patient verbalized understanding and agreed to receive post-discharge care at the [facility].   Patient scheduled for Transitional Care appointment on 03/31/15 at 1400 with Dr. Jarold Song.  Clinic information and appointment time provided to patient. Appointment information also placed on AVS.  Assessment:       Home Environment: Patient lives with her spouse and son in a private residence.       Support System: family members       Level of functioning: Assist needed. Patient discharging home with [facility] PT, OT services.       Home DME: cane, walker, home nebulizer, home oxygen. [facility] is oxygen supplier.       Home care services: Patient was at a skilled nursing facility prior to admission.  Discharge plan is for patient to return home with [facility] PT, OT services through [place].       Transportation: Patient's daughter, [name], takes patient to medical appointments.        Medications: Patient indicated she gets her medications from [facility]  and [facility]. Patient denied problems obtaining her medications.        Identified Barriers: uninsured. Patient indicated Medicare will become active when she turns 5; however, she is currently uninsured. Reminded patient that [facility] has Designer, fashion/clothing.  Informed patient she would benefit from an appointment with Financial Counselor to determine if she is eligible for the [employer] or [employer]. Patient verbalized understanding.        PCP: Cammie Sickle, NP at [place]              Arranged services:        Services communicated to Purcell Mouton, RN CM

## 2015-03-25 NOTE — Op Note (Addendum)
[facility] [place], [id number]   ENTEROSCOPY PROCEDURE REPORT     EXAM DATE: 03/25/2015  PATIENT NAME:      [name] [name]           MR #:      [id number]  BIRTHDATE:       08-Oct-1950      VISIT #:     [id number]  ATTENDING:               STATUS:     inpatient ASSISTANT:      Cleda Daub and Standley Dakins MD: ASA CLASS:        Class IV  INDICATIONS:  The patient is a 65 yr old female here for an enteroscopy procedure due to hemoccult positive stools and anemia.  PROCEDURE PERFORMED:     Diagnostic small bowel enteroscopy  MEDICATIONS:     Monitored anesthesia care  CONSENT: The patient understands the risks and benefits of the procedure and understands that these risks include, but are not limited to: sedation, allergic reaction, infection, perforation and/or bleeding. Alternative means of evaluation and treatment include, among others: physical exam, x-rays, and/or surgical intervention. The patient elects to proceed with this endoscopic procedure.  DESCRIPTION OF PROCEDURE: During intra-op preparation period all mechanical & medical equipment was checked for proper function. Hand hygiene and appropriate measures for infection prevention was taken. After the risks, benefits and alternatives of the procedure were thoroughly explained, Informed consent was verified, confirmed and timeout was successfully executed by the treatment team. The    endoscope was introduced through the mouth and advanced to the proximal jejunum jejunum. The prep was The overall prep quality was excellent.. The instrument was then slowly withdrawn while examining the mucosa circumferentially. The scope was then completely withdrawn from the patient and the procedure terminated. The pulse, BP, and O2 saturation were monitored and documented by the physician and the nursing staff throughout the entire procedure.  The patient was cared for as  planned according to standard protocol, then discharged to recovery in stable condition and with appropriate post procedure care. Estimated blood loss is zero unless otherwise noted in this procedure report.    ESOPHAGUS: The mucosa of the esophagus appeared normal.  DUODENUM: The duodenal mucosa showed no abnormalities in the entire duodenum.  JEJUNUM: The exam showed no abnormalities in the jejunum.  STOMACH: There was a single, diminutive, non-bleeding erosion in the proximal gastric body along the greater curvature.  No intervention was necessary.    ADVERSE EVENTS:      There were no immediate complications.  IMPRESSIONS:     1.  The mucosa of the esophagus appeared normal 2.  The duodenal mucosa showed no abnormalities in the entire duodenum 3.  The exam showed no abnormalities in the jejunum 4.  There was a single, diminutive, non-bleeding erosion in the proximal gastric body along the greater curvature.  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  This has been causing chronic occult GI blood loss and anemia, which is worsened by CKD.   RECOMMENDATIONS:     Iron supplementation, outpatient PCP follow up of Hgb and iron level.  No further endoscopic procedures are planned. per cardiology, the patient will remain on plavix (which can be resumed now), and off aspirin. RECALL:     for recall:  no repeat exam necessary.  _____________________________ Wilfrid Lund, MD eSigned:  Wilfrid Lund, MD 03/25/2015 11:00 AM re  cc:     PATIENT NAME:  [name], [name] MR#: [id number]

## 2015-03-25 NOTE — Care Management Note (Signed)
Case Management Note  Patient Details  Name: SAQQARA NEMET MRN: [id number] Date of Birth: February 12, 1951  Subjective/Objective:   Pt admitted with Anemia                 Action/Plan:  Plan to dc home with [facility]   Expected Discharge Date:                  Expected Discharge Plan:  [place]  In-House Referral:     Discharge planning Services  CM Consult  Post Acute Care Choice:    Choice offered to:  Patient  DME Arranged:    DME Agency:     HH Arranged:  PT, OT HH Agency:  [place]  Status of Service:  Completed, signed off  Medicare Important Message Given:    Date Medicare IM Given:    Medicare IM give by:    Date Additional Medicare IM Given:    Additional Medicare Important Message give by:     If discussed at [place] of Stay Meetings, dates discussed:    Additional CommentsPurcell Mouton, RN 03/25/2015, 3:34 PM

## 2015-03-25 NOTE — Progress Notes (Signed)
[facility] Progress Note  Patient: [name] [id number]   PCP: Pcp Not In System DOB: November 09, 1950   DOA: 03/19/2015   DOS: 03/25/2015   Date of Service: the patient was seen and examined on 03/25/2015  Subjective: Patient denies any acute complaint. Complains of fatigue and tiredness as well as shortness of breath. Nutrition: Was nothing by mouth last night. Activity: Walking in the room Last BM: 03/23/2015  Assessment and Plan: 1. Anemia GI bleed Patient presents with complains of abnormal lab. Patient was found to be having anemia at the time of admission. This is most likely secondary to GI bleed. Status post upper GI endoscopy which showed no evidence of active bleeding and suspected distal intestinal bleeding lesion. Continue with PPI. We'll follow recommendation from the GI. Next and monitor H&H.  2. Chronic diastolic dysfunction. Monitor for worsening. Continuing home medication.  3. Chronic kidney disease. Metabolic acidosis. Renal function appears to be stable. We will continue renal diet. Nephrotoxic medications.  4. Mood disorder. Continuing home medication.  DVT Prophylaxis: mechanical compression device. Nutrition: Renal diet Advance goals of care discussion: Full code  Brief Summary of Hospitalization:  HPI: As per the H and P dictated on admission, "[name] is a 65 y.o. female with a past medica history of diabetes mellitus, diabetic peripheral neuropathy, chronic kidney disease, COPD on home oxygen, hyperlipidemia, depression, CAD, chronic diastolic CHF, peripheral vascular disease, essential hypertension who was sent from a skilled nursing facility to the emergency department for evaluation of hypocalcemia 6.4 mg/dL. Redrawn level in the ED was 6.5 mg/dL and Corrected calcium level to a 2.2 g/dL albumin level was 7.9 mg/dL. It should be noted, that the patient was treated for hypercalcemia during her hospitalization earlier this month. However,  her hemoglobin level dropped from 9.6 to 6.4 g/dL today. Her creatinine level has also worsened slightly from 3. 61 week ago to 4.15 mg/dL today.  Per patient, and she was sent here because of an abnormal lab result. She denies headache, sore throat, chest pain, worsening of dyspnea, Diaphoresis, abdominal pain, nausea, emesis, diarrhea or constipation. She thinks that her stool may have been darker a few days ago, but is not totally sure. A few days ago she had mild dizziness and palpitations.  When seen in the emergency department, she was in no acute distress" Daily update, Procedures: 03/25/2015 upper GI endoscopy Consultants: Gastroenterology Antibiotics: Anti-infectives    None      Family Communication: no family was present at bedside, at the time of interview.   Disposition:  Expected discharge date: 03/26/2015 Barriers to safe discharge: H&H monitoring   Intake/Output Summary (Last 24 hours) at 03/25/15 1237 Last data filed at 03/25/15 1039  Gross per 24 hour  Intake    535 ml  Output   1700 ml  Net  -1165 ml   Filed Weights   03/20/15 0033 03/24/15 1109 03/25/15 0521  Weight: 58.6 kg (129 lb 3 oz) 58.4 kg (128 lb 12 oz) 56.1 kg (123 lb 10.9 oz)    Objective: Physical Exam: Filed Vitals:   03/25/15 1039 03/25/15 1040 03/25/15 1050 03/25/15 1123  BP:  132/46 153/48 125/59  Pulse: 71 68  76  Temp: 98.4 F (36.9 C)   98.2 F (36.8 C)  TempSrc: Oral   Oral  Resp: 19 17 16 17   Height:      Weight:      SpO2: 95% 95%  94%     General: Appear in  mild distress, no Rash; Oral Mucosa moist. Cardiovascular: S1 and S2 Present, no Murmur, no JVD Respiratory: Bilateral Air entry present and Clear to Auscultation, no Crackles, no wheezes Abdomen: Bowel Sound [resent, Soft and op tenderness Extremities: no Pedal edema, no calf tenderness Neurology: Grossly no focal neuro deficit.  Data Reviewed: CBC:  Recent Labs Lab 03/21/15 0541 03/22/15 0521 03/23/15 0530  03/24/15 0530 03/25/15 0530  WBC 10.0 8.5 6.8 6.6 8.2  HGB 8.7* 8.3* 8.3* 7.4* 8.9*  HCT 26.1* 24.8* 25.7* 22.6* 27.5*  MCV 91.3 88.9 91.1 89.3 92.3  PLT 361 271 227 212 [id number]   Basic Metabolic Panel:  Recent Labs Lab 03/20/15 0850 03/21/15 0541 03/22/15 0521 03/23/15 0530 03/24/15 0530 03/25/15 0530  NA 138 143 139 138 143 140  K 4.0 4.4 3.8 3.3* 4.5 4.1  CL 111 116* 113* 112* 115* 111  CO2 14* 17* 15* 14* 19* 18*  GLUCOSE 107* 124* 102* 71 85 104*  BUN 48* 55* 49* 47* 53* 46*  CREATININE 4.13* 4.00* 3.47* 3.22* 3.30* 3.20*  CALCIUM 6.6* 6.6* 6.2* 6.0* 6.0* 5.8*  MG 1.7  --   --   --   --   --   PHOS 2.9  --   --   --   --   --    Liver Function Tests:  Recent Labs Lab 03/19/15 2018 03/20/15 0850  AST 16 17  ALT 12* 12*  ALKPHOS 80 85  BILITOT 0.4 0.4  PROT 6.0* 6.4*  ALBUMIN 2.2* 2.4*   No results for input(s): LIPASE, AMYLASE in the last 168 hours. No results for input(s): AMMONIA in the last 168 hours.  Cardiac Enzymes:  Recent Labs Lab 03/24/15 1427 03/24/15 2320 03/25/15 0530  TROPONINI 0.05* 0.04* 0.05*    BNP (last 3 results)  Recent Labs  11/07/14 0325 12/24/14 1030 01/21/15 2041  BNP 2634.2* 1288.9* 833.4*    CBG:  Recent Labs Lab 03/24/15 1658 03/24/15 2341 03/25/15 0019 03/25/15 0517 03/25/15 1204  GLUCAP 109* 66 87 96 83    No results found for this or any previous visit (from the past 240 hour(s)).   Studies: No results found.   Scheduled Meds: . atorvastatin  80 mg Oral q1800  . carvedilol  3.125 mg Oral BID WC  . collagenase   Topical Daily  . isosorbide mononitrate  30 mg Oral Daily  . mometasone-formoterol  2 puff Inhalation BID  . pantoprazole (PROTONIX) IV  40 mg Intravenous Q12H  . sertraline  50 mg Oral Daily  . sodium bicarbonate  650 mg Oral BID  . sodium chloride flush  3 mL Intravenous Q12H  . tamsulosin  0.4 mg Oral Daily   Continuous Infusions: . sodium chloride    . lactated ringers     PRN  Meds: albuterol, morphine injection, ondansetron **OR** ondansetron (ZOFRAN) IV  Time spent: 30 minutes  Author: Berle Mull, MD [facility] Pager: [phone] 03/25/2015 12:37 PM  If 7PM-7AM, please contact night-coverage at [email or web address], password [facility]

## 2015-03-25 NOTE — Progress Notes (Signed)
65 year old woman with known coronary artery disease and peripheral vascular disease. She last underwent cardiac evaluation in December 2016. At that time, she had an abnormal nuclear study. This prompted a cardiac cath. She was found to have an occluded right coronary artery with left to right collaterals. There was moderate to severe distal left main and ostial LAD disease as well. A surgical consultation was obtained. Due to her chronic lung disease and the fact that her left-sided disease had not changed much since the previous cardiac cath, medical therapy was the treatment of choice. Her disease at the left main LAD circumflex bifurcation was not thought to be amenable to PCI.  Due to severe hypocalcemia, she was sent to the emergency room. Blood work done in the emergency room showed that her hemoglobin had dropped from 9.6-6.4. Hemoccult test was positive. She was also in acute renal failure with creatinine up to greater than 4.  Endoscopy was planned after the patient had 5 days off of Plavix. On the day of the procedure, anesthesiology spoke to the patient. The patient mentioned that she had some chest discomfort. The procedure was then canceled and cardiology consultation was obtained.   Subjective: Was coughing waiting for procedure.  Objective: Vital signs in last 24 hours: Temp:  [97.7 F (36.5 C)-99.2 F (37.3 C)] 98.1 F (36.7 C) (02/01 0521) Pulse Rate:  [66-78] 78 (02/01 0521) Resp:  [11-16] 16 (02/01 0521) BP: (97-149)/(43-68) 149/66 mmHg (02/01 0521) SpO2:  [94 %-99 %] 94 % (02/01 0819) Weight:  [123 lb 10.9 oz (56.1 kg)-128 lb 12 oz (58.4 kg)] 123 lb 10.9 oz (56.1 kg) (02/01 0521) Weight change:  Last BM Date: 03/23/15 Intake/Output from previous day: -1315 01/31 0701 - 02/01 0700 In: 335 [Blood:335] Out: 1300 [Urine:1300] Intake/Output this shift: Total I/O In: -  Out: 400 [Urine:400]  PE: UNABLE to examine pt in procedure.   Lab Results:  Recent  Labs  03/24/15 0530 03/25/15 0530  WBC 6.6 8.2  HGB 7.4* 8.9*  HCT 22.6* 27.5*  PLT 212 189   BMET  Recent Labs  03/24/15 0530 03/25/15 0530  NA 143 140  K 4.5 4.1  CL 115* 111  CO2 19* 18*  GLUCOSE 85 104*  BUN 53* 46*  CREATININE 3.30* 3.20*  CALCIUM 6.0* 5.8*    Recent Labs  03/24/15 2320 03/25/15 0530  TROPONINI 0.04* 0.05*    Lab Results  Component Value Date   CHOL 261* 01/24/2015   HDL 33* 01/24/2015   LDLCALC 173* 01/24/2015   LDLDIRECT 133.6 05/17/2012   TRIG 274* 01/24/2015   CHOLHDL 7.9 01/24/2015   Lab Results  Component Value Date   HGBA1C 6.2* 03/04/2015     Lab Results  Component Value Date   TSH 0.714 03/04/2015      Studies/Results: No results found.  Medications: I have reviewed the patient's current medications. Scheduled Meds: . atorvastatin  80 mg Oral q1800  . carvedilol  3.125 mg Oral BID WC  . collagenase   Topical Daily  . isosorbide mononitrate  30 mg Oral Daily  . mometasone-formoterol  2 puff Inhalation BID  . pantoprazole (PROTONIX) IV  40 mg Intravenous Q12H  . sertraline  50 mg Oral Daily  . sodium bicarbonate  650 mg Oral BID  . sodium chloride flush  3 mL Intravenous Q12H  . tamsulosin  0.4 mg Oral Daily   Continuous Infusions:  PRN Meds:.acetaminophen **OR** acetaminophen, albuterol, morphine injection, ondansetron **OR**  ondansetron (ZOFRAN) IV  Assessment/Plan: Principal Problem:   Anemia Active Problems:   GI bleed   Diabetes mellitus (HCC)   CHF (congestive heart failure) (HCC)   Essential hypertension   AKI (acute kidney injury) ([place])   CKD (chronic kidney disease)   Hypocalcemia   Absolute anemia   Sacral ulcer   Urinary retention  Chest discomfort: resolved .  Minimally elevated troponin on most recent check. I don't think that single troponin elevation represents acute coronary syndrome given her acute renal failure. With cycling of  enzymes troponins remained flat, we can safely assume that  this is not an acute event. ECG also does not reveal acute changes.    She is not a candidate for open heart surgery. She is not a candidate for cardiac cath or percutaneous intervention given her bleeding and renal failure. She will have to be managed medically.    Continue to transfuse as needed for symptomatic anemia.  Acute Blood loss anemia--Hgb now 8.9 --per IM  Acute renal failure 3.30 --per Im   LOS: 6 days   Time spent with pt. :15 minutes. [facility] R  Nurse Practitioner Certified Pager [phone] or after 5pm and on weekends call [phone] 03/25/2015, 9:32 AM   I have examined the patient and reviewed assessment and plan and discussed with patient.  Agree with above as stated.  I did speak at length with Dr. Ardis Hughs yesterday. I think it would be reasonable to have the patient continue with clopidogrel monotherapy and hold aspirin in an attempt to prevent GI bleeding in the future.  She tolerated her procedure well.   Will sign off at this time.   VARANASI,JAYADEEP S.

## 2015-03-25 NOTE — Progress Notes (Signed)
Pt short of breath, resp crackles, congested cough. MD notified. Will continue to monitor.

## 2015-03-26 ENCOUNTER — Encounter (HOSPITAL_COMMUNITY): Payer: Self-pay | Admitting: Gastroenterology

## 2015-03-26 DIAGNOSIS — E440 Moderate protein-calorie malnutrition: Secondary | ICD-10-CM | POA: Insufficient documentation

## 2015-03-26 LAB — CBC WITH DIFFERENTIAL/PLATELET
Basophils Absolute: 0 10*3/uL (ref 0.0–0.1)
Basophils Relative: 0 %
EOS PCT: 1 %
Eosinophils Absolute: 0.1 10*3/uL (ref 0.0–0.7)
HCT: 28.6 % — ABNORMAL LOW (ref 36.0–46.0)
Hemoglobin: 9.5 g/dL — ABNORMAL LOW (ref 12.0–15.0)
LYMPHS ABS: 0.7 10*3/uL (ref 0.7–4.0)
Lymphocytes Relative: 10 %
MCH: 30.3 pg (ref 26.0–34.0)
MCHC: 33.2 g/dL (ref 30.0–36.0)
MCV: 91.1 fL (ref 78.0–100.0)
MONO ABS: 0.3 10*3/uL (ref 0.1–1.0)
Monocytes Relative: 5 %
Neutro Abs: 5.6 10*3/uL (ref 1.7–7.7)
Neutrophils Relative %: 84 %
PLATELETS: 166 10*3/uL (ref 150–400)
RBC: 3.14 MIL/uL — AB (ref 3.87–5.11)
RDW: 15.2 % (ref 11.5–15.5)
WBC: 6.7 10*3/uL (ref 4.0–10.5)

## 2015-03-26 LAB — COMPREHENSIVE METABOLIC PANEL
ALT: 58 U/L — AB (ref 14–54)
ANION GAP: 12 (ref 5–15)
AST: 21 U/L (ref 15–41)
Albumin: 2.2 g/dL — ABNORMAL LOW (ref 3.5–5.0)
Alkaline Phosphatase: 76 U/L (ref 38–126)
BUN: 44 mg/dL — ABNORMAL HIGH (ref 6–20)
CHLORIDE: 109 mmol/L (ref 101–111)
CO2: 17 mmol/L — AB (ref 22–32)
CREATININE: 2.99 mg/dL — AB (ref 0.44–1.00)
Calcium: 5.4 mg/dL — CL (ref 8.9–10.3)
GFR calc non Af Amer: 15 mL/min — ABNORMAL LOW (ref >60)
GFR, EST AFRICAN AMERICAN: 18 mL/min — AB (ref >60)
Glucose, Bld: 130 mg/dL — ABNORMAL HIGH (ref 65–99)
POTASSIUM: 3.8 mmol/L (ref 3.5–5.1)
SODIUM: 138 mmol/L (ref 135–145)
Total Bilirubin: 0.6 mg/dL (ref 0.3–1.2)
Total Protein: 6.1 g/dL — ABNORMAL LOW (ref 6.5–8.1)

## 2015-03-26 LAB — MAGNESIUM: Magnesium: 1.2 mg/dL — ABNORMAL LOW (ref 1.7–2.4)

## 2015-03-26 LAB — GLUCOSE, CAPILLARY
GLUCOSE-CAPILLARY: 115 mg/dL — AB (ref 65–99)
GLUCOSE-CAPILLARY: 152 mg/dL — AB (ref 65–99)
GLUCOSE-CAPILLARY: 76 mg/dL (ref 65–99)

## 2015-03-26 MED ORDER — SODIUM BICARBONATE 650 MG PO TABS: 650.0000 mg | ORAL_TABLET | Freq: Two times a day (BID) | ORAL | Status: DC

## 2015-03-26 MED ORDER — AMLODIPINE BESYLATE 5 MG PO TABS
5.0000 mg | ORAL_TABLET | Freq: Every day | ORAL | Status: DC
  Administered 2015-03-26: 5 mg via ORAL
  Filled 2015-03-26: qty 1

## 2015-03-26 MED ORDER — FUROSEMIDE 20 MG PO TABS
20.0000 mg | ORAL_TABLET | Freq: Every day | ORAL | Status: DC
  Administered 2015-03-26: 20 mg via ORAL
  Filled 2015-03-26: qty 1

## 2015-03-26 MED ORDER — FUROSEMIDE 20 MG PO TABS: 20.0000 mg | ORAL_TABLET | Freq: Every day | ORAL | Status: DC

## 2015-03-26 MED ORDER — ENSURE ENLIVE PO LIQD: 237.0000 mL | ORAL | Status: DC

## 2015-03-26 MED ORDER — BENZONATATE 100 MG PO CAPS: 100.0000 mg | ORAL_CAPSULE | Freq: Three times a day (TID) | ORAL | Status: DC | PRN

## 2015-03-26 MED ORDER — CLOPIDOGREL BISULFATE 75 MG PO TABS
75.0000 mg | ORAL_TABLET | Freq: Every day | ORAL | Status: DC
  Administered 2015-03-26: 75 mg via ORAL
  Filled 2015-03-26: qty 1

## 2015-03-26 MED ORDER — AMLODIPINE BESYLATE 10 MG PO TABS: 5.0000 mg | ORAL_TABLET | Freq: Every day | ORAL | Status: DC

## 2015-03-26 MED ORDER — SODIUM CHLORIDE 0.9 % IV SOLN
1.0000 g | Freq: Once | INTRAVENOUS | Status: DC
  Filled 2015-03-26: qty 10

## 2015-03-26 MED ORDER — GUAIFENESIN ER 600 MG PO TB12: 600.0000 mg | ORAL_TABLET | Freq: Two times a day (BID) | ORAL | Status: DC

## 2015-03-26 MED ORDER — CALCIUM CITRATE-VITAMIN D 250-100 MG-UNIT PO TABS: 1.0000 | ORAL_TABLET | Freq: Every day | ORAL | Status: DC

## 2015-03-26 MED FILL — ?FUROSEMIDE 20 MG TABLET: 20 | 30 days supply | Qty: 30 | Fill #0

## 2015-03-26 MED FILL — ?AMLODIPINE BESYLATE 10 MG: 10 | 30 days supply | Qty: 15 | Fill #0

## 2015-03-26 MED FILL — BENZONATATE 100 MG CAPSULE: 100 | 7 days supply | Qty: 20 | Fill #0

## 2015-03-26 MED FILL — SODIUM BICARB 650 MG TABLET: 650 | 15 days supply | Qty: 30 | Fill #0

## 2015-03-26 NOTE — Progress Notes (Signed)
Initial Nutrition Assessment  DOCUMENTATION CODES:   Non-severe (moderate) malnutrition in context of chronic illness  INTERVENTION:  - RD will continue to monitor for needs - Will order Ensure Enlive once/day, this supplement provides 350 kcal and 20 grams of protein  NUTRITION DIAGNOSIS:   Increased nutrient needs related to wound healing as evidenced by estimated needs.  GOAL:   Patient will meet greater than or equal to 90% of their needs  MONITOR:   PO intake, Weight trends, Labs, Skin, I & O's  REASON FOR ASSESSMENT:   Consult Assessment of nutrition requirement/status  ASSESSMENT:   65 year old woman with known coronary artery disease and peripheral vascular disease. She last underwent cardiac evaluation in December 2016. At that time, she had an abnormal nuclear study. This prompted a cardiac cath. She was found to have an occluded right coronary artery with left to right collaterals. There was moderate to severe distal left main and ostial LAD disease as well. A surgical consultation was obtained. Due to her chronic lung disease and the fact that her left-sided disease had not changed much since the previous cardiac cath, medical therapy was the treatment of choice. Her disease at the left main LAD circumflex bifurcation was not thought to be amenable to PCI. Due to severe hypocalcemia, she was sent to the emergency room. Blood work done in the emergency room showed that her hemoglobin had dropped from 9.6-6.4. Hemoccult test was positive. She was also in acute renal failure with creatinine up to greater than 4. Endoscopy was planned after the patient had 5 days off of Plavix. On the day of the procedure, anesthesiology spoke to the patient. The patient mentioned that she had some chest discomfort. The procedure was then canceled and cardiology consultation was obtained.  Pt seen for consult. BMI indicates overweight status. Pt has been eating 50-75% since admission and has  had several instances of NPO status since admission including for enteroscopy 03/25/15. Pt states that for breakfast this AM she ate pancakes and graham crackers with peanut butter. She denies abdominal discomfort or nausea with intakes this AM. Pt states that PTA she had a fair appetite. Noted that she is missing several bottom teeth but pt denies any chewing difficulty related to this. She denies swallowing difficulty. Pt states that she was drinking Ensure once/day PTA. Will order during admission.   Pt denies recent weight changes and states stable weight PTA. Per chart review, pt has lost 5 lbs (4% body weight) in the past 3 weeks which is significant for time frame. In addition, she has lost 15 lbs (10% body weight) in the past 3 months which is also significant for time frame. Unable to do physical assessment at this time.   Likely meeting needs currently. Medications reviewed. Labs reviewed; CBGs: 66-142 mg/dL, BUN/creatinine elevated but trending down, Ca: 5.4 mg/dL, Mg: 1.2 mg/dL, GFR: 15.   Diet Order:  Diet Carb Modified Fluid consistency:: Thin; Room service appropriate?: Yes  Skin:  Wound (see comment) (Stage 2 sacral pressure ulcer, DTI to coccyx)  Last BM:  1/30  Height:   Ht Readings from Last 1 Encounters:  03/20/15 4\' 10"  (1.473 m)    Weight:   Wt Readings from Last 1 Encounters:  03/26/15 127 lb 13.9 oz (58 kg)    Ideal Body Weight:  43.64 kg (kg)  BMI:  Body mass index is 26.73 kg/(m^2).  Estimated Nutritional Needs:   Kcal:  1450-1650  Protein:  60-70 grams  Fluid:  1.8-2  L/day  EDUCATION NEEDS:   No education needs identified at this time     Jarome Matin, [place], [facility] Inpatient Clinical Dietitian Pager # [phone] After hours/weekend pager # [phone]

## 2015-03-26 NOTE — Consult Note (Signed)
WOC wound consult note Reason for Consult: re-consulted for evaluation for wound care Patient currently receiving hydrotherapy in patient.  Discussed case with PT, they are not making lots of progress with pulsatile lavage at this point.  Hospitalist looking at DC to home soon if not today.  Wound type: Unstagable Pressure Injury gluteal cleft Pressure Ulcer POA: Yes Measurement: 3cm x 3.8cm with depth obscured by the presence of adherent, soft yellow eschar Wound bed:100% yellow slough Drainage (amount, consistency, odor) yellow, serous Periwound: intact  Dressing procedure/placement/frequency: Continue enzymatic debridement ointment daily, cover with moist gauze.  HHRN can do this and teach family to provide this care.  Chair pad for pressure redistribution when up in chair.  Verified with unit secretary need for chair pad, this has not been delivered to the room yet.  We will make sure this gets delivered to home prior to DC.  Recommenced to MD follow up with wound care center for reassessment and possible serial debridements.  CM aware of need.  Will continue hydrotherapy until DC to home.  Discussed POC with patient and bedside nurse.  Re consult if needed, will not follow at this time. Thanks  Seher Schlagel [place], [place] [phone])

## 2015-03-26 NOTE — Progress Notes (Signed)
CSW received notification from [facility] that pt has now made plans to discharge to daughters home and have home health services instead of discharging back to [facility] for continued rehab.  CSW updated [facility].  RNCM assisting with home health needs.  No further social work needs identified at this time.  CSW signing off.   Alison Murray, MSW, [place] Work [phone]

## 2015-03-26 NOTE — Progress Notes (Signed)
Spoke with pt , husband and daughter at bedside who stated that pt will go home with Home Health.

## 2015-03-27 ENCOUNTER — Telehealth: Payer: Self-pay

## 2015-03-27 ENCOUNTER — Other Ambulatory Visit: Payer: Self-pay | Admitting: Family Medicine

## 2015-03-27 MED ORDER — MOMETASONE FURO-FORMOTEROL FUM 100-5 MCG/ACT IN AERO: 1.0000 | INHALATION_SPRAY | Freq: Every day | RESPIRATORY_TRACT | Status: DC

## 2015-03-27 MED FILL — FERROUS SULFATE 325 MG TAB: 325 (65 FE) | 30 days supply | Qty: 90 | Fill #0

## 2015-03-27 MED FILL — VENTOLIN HFA 90 MCG INHALER: 108 (90 BAS | 25 days supply | Qty: 18 | Fill #0

## 2015-03-27 MED FILL — ?DULERA 100 MCG/5 MCG INH: 100-5 | 60 days supply | Qty: 1 | Fill #0

## 2015-03-27 MED FILL — ISOSORBIDE MN ER 30 MG TAB: 30 | 30 days supply | Qty: 30 | Fill #1

## 2015-03-27 MED FILL — CLOPIDOGREL 75 MG TABLET: 75 | 30 days supply | Qty: 30 | Fill #0

## 2015-03-27 MED FILL — ATORVASTATIN 80 MG TABLET: 80 | 30 days supply | Qty: 30 | Fill #0

## 2015-03-27 NOTE — Telephone Encounter (Signed)
[facility] Post-discharge Follow-Up Phone Call:  Date of Discharge: 03/26/15 Principal Discharge Diagnosis(es): Anemia, GI bleed, Chronic diastolic dysfunction Post-discharge Communication: This Case Manager spoke with patient to complete post-discharge follow-up phone call. Patient wanted this Case Manager to review medications with her daughter, [name] (Ph# [phone]) since her daughter picks up her medications and sets up her pill box. Call Completed: Yes                    With Whom: Patient and daughter, [name]   Please check all that apply:  X  Patient is knowledgeable of his/her condition(s) and/or treatment. ? Patient is caring for self at home.  X   Patient is receiving assist at home from family and/or caregiver. Family and/or caregiver is knowledgeable of patient's condition(s) and/or treatment. X  Patient is receiving home health services (PT, OT) services with [facility]. Patient indicated she has received a phone call from [facility] services to begin this afternoon.   Medication Reconciliation:  X  Medication list reviewed with patient's daughter, [name]. X  Patient obtained all discharge medications-NO. This Case Manager reviewed all medications with patient's daughter, [name]. Patient's daughter questioned if medications ready for pick-up at [facility] and [place]. Spoke with Anderson Malta, Pharmacy Tech, who indicated medications would be ready for pick-up in a couple of hours. Patient's daughter appreciative of information.  Reviewed all patient's medications with patient's daughter.  Discharge summary not yet available. Daughter questioned if patient to remain on aspirin.  This Case Manager and Dr. Jarold Song reviewed EMR and per Dr. Loletha Carrow, Gastroenterologist, patient is to stop aspirin and resume Plavix. Dr. Jarold Song indicated this should remain plan until further recommendations from GI. Patient's daughter updated and appreciative of  information. She indicated she will pick-up patient's discharge medications today.   Activities of Daily Living:   ? Independent X  Needs assist-Patient lives at home with spouse. Patient discharged home with [facility] PT, OT services with [place]. Patient has the following home DME: cane, walker, home nebulizer, home oxygen. Oxygen supplier is [place]. ? [facility]    Community resources in place for patient:  ? None  X  Home Health PT, OT services with [facility] ? Assisted Living ? Support Group           Questions/Concerns discussed: This Case Manager placed call to patient to check on status and to remind her of upcoming [facility] appointment. Patient indicated she was doing well. She denied any respiratory difficulty or shortness of breath. Patient also denied any rectal bleeding. Patient aware of [facility] appointment on 03/31/15 at 1400 with Dr. Jarold Song and indicated her daughter, [name], would provide transportation to her appointment. She requested that this Case Manager call her daughter, [name], and review medication list with her since her daughter picks up her medications and fills medication box. Patient's daughter, [name], called and medication list reviewed. [name] indicated she plans to pick up patient's discharge medications today. She is aware of patient's appointment with Dr. Jarold Song. No additional needs/concerns identified.

## 2015-03-27 NOTE — Telephone Encounter (Signed)
Patient has a fever 101.1.[name] KitchenMarland Kitchenpatient needs medication Carvedilol, Calcium with Vit D, Pepcid, fish oil, Novolog, Dulera.[name] KitchenMarland KitchenMarland KitchenMarland Kitchenplease send to pharmacy [facility]

## 2015-03-27 NOTE — Discharge Summary (Signed)
[facility] Discharge Summary   Patient: [name] [id number]   PCP: Pcp Not In System DOB: 04/15/1950   Date of admission: 03/19/2015   Date of discharge: 03/27/2015    Discharge Diagnoses:  Principal Problem:   GI bleed Active Problems:   Chronic obstructive pulmonary disease ([place])   Renal artery stenosis (HCC)   Diabetes mellitus (HCC)   Chronic diastolic CHF (congestive heart failure) (HCC)   Essential hypertension   CKD (chronic kidney disease)   Anemia   Hypocalcemia   Sacral ulcer   Urinary retention   Coronary artery disease involving native coronary artery of native heart with angina pectoris (HCC)   Malnutrition of moderate degree   Recommendations for Outpatient Follow-up:  1. Please follow-up with PCP in one week with CBC as well as BMP to monitor anemia as well as calcium level.  2. PCP, Please review the underlined recommendation at the time of the discharge. Recommend PCP to follow up on the calcium level to adjust further need for calcium Recommended to follow-up with PCP with CBC, decide about iron supplementation as an outpatient. Continue sodium bicarbonate for 1 week and reassess with BMP with PCP regarding further need. Patient is recommended to have follow-up BMP with PCP as an outpatient in one week as well as reassessment of further requirement for Lasix dosing   Follow-up Information    Follow up with [facility] .   Specialty:  [place] information:   [address] [place] [phone]      Follow up with [facility] On 03/31/2015.   Why:  [facility] Clinic appointment on 03/31/15 at 2:00 pm with Dr. Jarold Song.   Contact information:   [address] [place] [id number] [phone]      Follow up On 04/01/2015.   Why:  [facility], [place] [place], [place].   appointment at 9:30 AM, please arrive early      Call [facility].   Specialty:  Gastroenterology   Why:  As needed   Contact information:   [address] [place] [phone]     Diet recommendation: Renal diet  Activity: The patient is advised to gradually reintroduce usual activities.  Discharge Condition: good  History of present illness: As per the H and P dictated on admission, "[name] is a 65 y.o. female with a past medica history of diabetes mellitus, diabetic peripheral neuropathy, chronic kidney disease, COPD on home oxygen, hyperlipidemia, depression, CAD, chronic diastolic CHF, peripheral vascular disease, essential hypertension who was sent from a skilled nursing facility to the emergency department for evaluation of hypocalcemia 6.4 mg/dL. Redrawn level in the ED was 6.5 mg/dL and Corrected calcium level to a 2.2 g/dL albumin level was 7.9 mg/dL. It should be noted, that the patient was treated for hypercalcemia during her hospitalization earlier this month. However, her hemoglobin level dropped from 9.6 to 6.4 g/dL today. Her creatinine level has also worsened slightly from 3. 61 week ago to 4.15 mg/dL today.  Per patient, and she was sent here because of an abnormal lab result. She denies headache, sore throat, chest pain, worsening of dyspnea, Diaphoresis, abdominal pain, nausea, emesis, diarrhea or constipation. She thinks that her stool may have been darker a few days ago, but is not totally sure. A few days ago she had mild dizziness and palpitations.  When seen in the emergency department, she was in no acute distress"  Hospital Course:  Summary of her active problems in the hospital is as following. 1. Anemia GI bleed Patient presents with complains of abnormal lab. Patient was found to be having anemia at the time of admission. This is most likely secondary to GI bleed. Status post upper GI endoscopy which  showed no evidence of active bleeding and suspected distal intestinal bleeding lesion. On discharge patient was transitioned back to oral Pepcid. Recommended to follow-up with PCP with CBC, decide about iron supplementation as an outpatient.  2. Chronic diastolic dysfunction. Essential hypertension Patient's blood pressure was stable with reduced medication dosing, and therefore the time of the discharge her hydralazine was discontinued and she was given 5 mg amlodipine prescription.  Recently patient presented with acute kidney injury and her Lasix was discontinued she was on 40 mg daily Lasix at home. Patient appears to be having volume overload and therefore 20 mg Lasix was added to her regimen after giving 1 dose of IV Lasix 40 mg. ACE inhibitor as well as ARB has not been given due to her renal function. Patient is recommended to have follow-up BMP with PCP as an outpatient in one week as well as reassessment of further requirement for Lasix dosing  3. Chronic kidney disease. Metabolic acidosis. Renal function appears to be stable. We will continue renal diet.  Continue sodium bicarbonate for 1 week and reassess with BMP with PCP regarding further need.  4. Mood disorder. Continuing home medication.  5. COPD with chronic hypoxic respiratory failure Oxygenation remained stable on 2 L of oxygen which patients use at home. Continue albuterol inhaler as well as by breo. She was also using dual layer out at home. Recommend PCP to decide about removal of one of the inhaler at the time of visit  6. Type 2 diabetes mellitus Continue sliding scale insulin at home  7.Hypocalcemia Patient was recently admitted for acute kidney injury with mild cartilage syndrome secondary to taking a lot of Tums developing hypercalcemia. Her corrected calcium is significantly lower and therefore would recommend her to start using once a day calcium supplementation for one week. Recommend PCP to follow up on  the calcium level to adjust  9. Sacral ulcer with pressure ulcer Patient was seen by wound care and recommend her to follow-up with wound care center. No further need for hydrotherapy at present. Home health will continue enzymatic debridement ointment daily covered with moist gauze. Chair pad for pressure redistribution went up in the chair. Wound care center follow-up on 04/01/2015.  10. Malnutrition of moderate degree  Appreciate input from the dietitian. Continue nutritional supplementation on discharge.  All other chronic medical condition were stable during the hospitalization.  Patient was seen by physical therapy, who recommended SNF, but patient and the family decided to go to home with home health, which was arranged by Education officer, museum and case Freight forwarder. On the day of the discharge the patient's hemoglobin remained stable, oxygenation was at her baseline, vitals remained stable, and no other acute medical condition were reported by patient. the patient was felt safe to be discharge at home with home health.  Procedures and Results:  EGD with small bowel endoscopy-  patient probably has more distal small bowel aspirin-induced ulcer or AV malformation(beyond the reach of the scope), conservative management with chronic iron supplementation outpatient PCP monitoring of hemoglobin and iron level, stay off the aspirin, resume Plavix, no further endoscopic procedures are planned  Consultations:  Gastroenterology, cardiology  DISCHARGE MEDICATION: Discharge Medication List as of 03/26/2015  1:48 PM    START taking these medications   Details  benzonatate (TESSALON) 100 MG capsule Take 1 capsule (100 mg total) by mouth 3 (three) times daily as needed for cough., Starting 03/26/2015, Until Discontinued, Normal    calcium-vitamin D 250-100 MG-UNIT tablet Take 1 tablet by mouth daily., Starting 03/26/2015, Until Discontinued, Normal    feeding supplement, ENSURE ENLIVE, (ENSURE ENLIVE) LIQD  Take 237 mLs by mouth daily., Starting 03/26/2015, Until Discontinued, Normal    furosemide (LASIX) 20 MG tablet Take 1 tablet (20 mg total) by mouth daily., Starting 03/26/2015, Until Discontinued, Normal    guaiFENesin (MUCINEX) 600 MG 12 hr tablet Take 1 tablet (600 mg total) by mouth 2 (two) times daily., Starting 03/26/2015, Until Discontinued, Normal    sodium bicarbonate 650 MG tablet Take 1 tablet (650 mg total) by mouth 2 (two) times daily., Starting 03/26/2015, Until Discontinued, Normal      CONTINUE these medications which have CHANGED   Details  amLODipine (NORVASC) 10 MG tablet Take 0.5 tablets (5 mg total) by mouth daily., Starting 03/26/2015, Until Discontinued, Normal      CONTINUE these medications which have NOT CHANGED   Details  albuterol (PROVENTIL HFA;VENTOLIN HFA) 108 (90 BASE) MCG/ACT inhaler Inhale 2 puffs into the lungs every 6 (six) hours as needed for wheezing or shortness of breath., Starting 02/17/2015, Until Discontinued, Normal    aspirin EC 81 MG tablet Take 1 tablet (81 mg total) by mouth daily., Starting 02/17/2015, Until Discontinued, Normal    atorvastatin (LIPITOR) 80 MG tablet Take 1 tablet (80 mg total) by mouth daily at 6 PM., Starting 02/17/2015, Until Discontinued, Normal    carvedilol (COREG) 3.125 MG tablet Take 1 tablet (3.125 mg total) by mouth 2 (two) times daily with a meal., Starting 03/12/2015, Until Discontinued, No Print    clopidogrel (PLAVIX) 75 MG tablet Take 1 tablet (75 mg total) by mouth daily., Starting 02/17/2015, Until Discontinued, Normal    famotidine (PEPCID) 20 MG tablet Take 1 tablet (20 mg total) by mouth daily., Starting 03/12/2015, Until Discontinued, No Print    Ferrous Sulfate (IRON) 325 (65 FE) MG TABS Take 325 mg by mouth 3 (three) times daily., Starting 02/17/2015, Until Discontinued, Normal    fish oil-omega-3 fatty acids 1000 MG capsule Take 1 g by mouth 3 (three) times daily. Three times daily, Until Discontinued,  Historical Med    Fluticasone Furoate-Vilanterol (BREO ELLIPTA) 100-25 MCG/INH AEPB Inhale 1 Inhaler into the lungs daily., Starting 02/17/2015, Until Discontinued, Normal    hydrocortisone 2.5 % lotion Apply topically 2 (two) times daily., Starting 01/08/2015, Until Discontinued, Normal    insulin aspart (NOVOLOG) 100 UNIT/ML injection Before each meal 3 times a day, 140-199 - 2 units, 200-250 - 4 units, 251-299 - 6 units,  300-349 - 8 units,  350 or above 10 units. Dispense syringes and needles as needed, Ok to switch to PEN if approved. Substitute to any brand approved. DX DM2, Code  E11.65, No Print    isosorbide mononitrate (IMDUR) 30 MG 24 hr tablet Take 1 tablet (30 mg total) by mouth daily., Starting 02/17/2015, Until Discontinued, Normal    mometasone-formoterol (DULERA) 100-5 MCG/ACT AERO Inhale 1 puff into the lungs daily., Starting 03/12/2015, Until Discontinued, Print    nitroGLYCERIN (NITROSTAT) 0.4 MG SL tablet Place 1 tablet (0.4 mg total) under the tongue every 5 (five) minutes as needed for chest pain., Starting  01/28/2015, Until Discontinued, Print    sertraline (ZOLOFT) 50 MG tablet Take 1 tablet (50 mg total) by mouth daily., Starting 02/04/2015, Until Discontinued, Normal    tamsulosin (FLOMAX) 0.4 MG CAPS capsule Take 1 capsule (0.4 mg total) by mouth daily., Starting 03/12/2015, Until Discontinued, No Print      STOP taking these medications     cephALEXin (KEFLEX) 250 MG capsule      hydrALAZINE (APRESOLINE) 50 MG tablet        No Known Allergies  Discharge Exam: Filed Weights   03/25/15 0521 03/25/15 1738 03/26/15 0500  Weight: 56.1 kg (123 lb 10.9 oz) 57.1 kg (125 lb 14.1 oz) 58 kg (127 lb 13.9 oz)   Filed Vitals:   03/25/15 2019 03/26/15 0451  BP: 157/50 153/97  Pulse: 82 74  Temp: 99.3 F (37.4 C) 98 F (36.7 C)  Resp: 18 16   General: Appear in no distress, no Rash; Oral Mucosa moist Cardiovascular: S1 and S2 Present, no Murmur, no  JVD Respiratory: Bilateral Air entry present and faint basal Crackles, no wheezes Abdomen: Bowel Sound persent, Soft and no tenderness Extremities: no Pedal edema, no calf tenderness Neurology: Grossly no focal neuro deficit.  The results of significant diagnostics from this hospitalization (including imaging, microbiology, ancillary and laboratory) are listed below for reference.    Significant Diagnostic Studies: Dg Chest 2 View  03/04/2015  CLINICAL DATA:  Altered mental status today. Bilateral upper extremity weakness. EXAM: CHEST  2 VIEW COMPARISON:  PA and lateral chest 01/21/2015 and 09/07/2012. CT chest 12/14/2011. FINDINGS: There is cardiomegaly and mild vascular congestion. No consolidative process, pneumothorax or effusion is identified. IMPRESSION: No acute disease. Cardiomegaly and vascular congestion. Electronically Signed   By: Inge Rise M.D.   On: 03/04/2015 10:25   Ct Head Wo Contrast  03/04/2015  CLINICAL DATA:  Altered mental status, weakness, difficulty with ambulation EXAM: CT HEAD WITHOUT CONTRAST TECHNIQUE: Contiguous axial images were obtained from the base of the skull through the vertex without intravenous contrast. COMPARISON:  08/10/2004 FINDINGS: There is no evidence of mass effect, midline shift, or extra-axial fluid collections. There is no evidence of a space-occupying lesion or intracranial hemorrhage. There is no evidence of a cortical-based area of acute infarction. There is generalized cerebral atrophy. There is periventricular white matter low attenuation likely secondary to microangiopathy. The ventricles and sulci are appropriate for the patient's age. The basal cisterns are patent. Visualized portions of the orbits are unremarkable. The visualized portions of the paranasal sinuses and mastoid air cells are unremarkable. Cerebrovascular atherosclerotic calcifications are noted. The osseous structures are unremarkable. IMPRESSION: 1. No acute intracranial  pathology. 2. Chronic microvascular disease and cerebral atrophy. Electronically Signed   By: Kathreen Devoid   On: 03/04/2015 10:36   US Renal  03/07/2015  CLINICAL DATA:  Acute kidney failure.  Diabetes and hypertension. EXAM: RENAL / URINARY TRACT ULTRASOUND COMPLETE COMPARISON:  09/08/2012 FINDINGS: Right Kidney: Length: 11.1 cm. Mildly increased parenchymal echogenicity, consistent with medical renal disease. No mass or hydronephrosis visualized. Left Kidney: Length: 10.0 cm. Mildly increased parenchymal echogenicity, consistent with medical renal disease. No mass or hydronephrosis visualized. Bladder: Appears normal for degree of bladder distention. IMPRESSION: Mildly increased renal parenchymal echogenicity, consistent with medical renal disease. No evidence of hydronephrosis. Electronically Signed   By: Earle Gell M.D.   On: 03/07/2015 16:19   Dg Chest Port 1 View  03/25/2015  CLINICAL DATA:  65 year old female with shortness of breath and weakness today.  EXAM: PORTABLE CHEST 1 VIEW COMPARISON:  Chest x-ray 03/08/2015. FINDINGS: There is cephalization of the pulmonary vasculature and slight indistinctness of the interstitial markings suggestive of mild pulmonary edema. No definite pleural effusions. Heart size is mildly enlarged. The patient is rotated to the left on today's exam, resulting in distortion of the mediastinal contours and reduced diagnostic sensitivity and specificity for mediastinal pathology. Atherosclerosis in the thoracic aorta. IMPRESSION: 1. The appearance the chest suggests congestive heart failure, as above. Electronically Signed   By: Vinnie Langton M.D.   On: 03/25/2015 17:50   Dg Chest Port 1 View  03/08/2015  CLINICAL DATA:  COPD, congestive heart failure.  Short of breath. EXAM: PORTABLE CHEST 1 VIEW COMPARISON:  03/04/2015 FINDINGS: Normal cardiac silhouette. Mild bronchitic markings centrally similar prior. Some linear nodular opacity at the RIGHT lung base. No  pneumothorax. IMPRESSION: Mild linear nodular pattern RIGHT lung base could represent early pneumonia. Overall similar exam with central chronic bronchitic markings. Electronically Signed   By: Suzy Bouchard M.D.   On: 03/08/2015 12:56    Microbiology: No results found for this or any previous visit (from the past 240 hour(s)).   Labs: CBC:  Recent Labs Lab 03/22/15 0521 03/23/15 0530 03/24/15 0530 03/25/15 0530 03/26/15 0453  WBC 8.5 6.8 6.6 8.2 6.7  NEUTROABS  --   --   --   --  5.6  HGB 8.3* 8.3* 7.4* 8.9* 9.5*  HCT 24.8* 25.7* 22.6* 27.5* 28.6*  MCV 88.9 91.1 89.3 92.3 91.1  PLT 271 227 212 189 [id number]   Basic Metabolic Panel:  Recent Labs Lab 03/22/15 0521 03/23/15 0530 03/24/15 0530 03/25/15 0530 03/26/15 0453  NA 139 138 143 140 138  K 3.8 3.3* 4.5 4.1 3.8  CL 113* 112* 115* 111 109  CO2 15* 14* 19* 18* 17*  GLUCOSE 102* 71 85 104* 130*  BUN 49* 47* 53* 46* 44*  CREATININE 3.47* 3.22* 3.30* 3.20* 2.99*  CALCIUM 6.2* 6.0* 6.0* 5.8* 5.4*  MG  --   --   --   --  1.2*   Liver Function Tests:  Recent Labs Lab 03/26/15 0453  AST 21  ALT 58*  ALKPHOS 76  BILITOT 0.6  PROT 6.1*  ALBUMIN 2.2*   No results for input(s): LIPASE, AMYLASE in the last 168 hours. No results for input(s): AMMONIA in the last 168 hours. Cardiac Enzymes:  Recent Labs Lab 03/24/15 1427 03/24/15 2320 03/25/15 0530  TROPONINI 0.05* 0.04* 0.05*   BNP (last 3 results)  Recent Labs  12/24/14 1030 01/21/15 2041 03/25/15 1830  BNP 1288.9* 833.4* 524.8*   CBG:  Recent Labs Lab 03/25/15 1204 03/25/15 1710 03/26/15 0026 03/26/15 0456 03/26/15 1144  GLUCAP 83 142* 76 115* 152*   Time spent: 30 minutes  Signed:  Tawnya Pujol  [facility] 03/27/2015, 12:15 PM

## 2015-03-29 ENCOUNTER — Emergency Department (HOSPITAL_COMMUNITY): Payer: Self-pay

## 2015-03-29 ENCOUNTER — Inpatient Hospital Stay (HOSPITAL_COMMUNITY)
Admission: EM | Admit: 2015-03-29 | Discharge: 2015-04-07 | DRG: 190 | Disposition: A | Discharge status: Home Health | Payer: Self-pay | Attending: Internal Medicine | Admitting: Internal Medicine

## 2015-03-29 ENCOUNTER — Encounter (HOSPITAL_COMMUNITY): Payer: Self-pay | Admitting: Emergency Medicine

## 2015-03-29 DIAGNOSIS — E876 Hypokalemia: Secondary | ICD-10-CM | POA: Diagnosis present

## 2015-03-29 DIAGNOSIS — I2510 Atherosclerotic heart disease of native coronary artery without angina pectoris: Secondary | ICD-10-CM | POA: Diagnosis present

## 2015-03-29 DIAGNOSIS — F329 Major depressive disorder, single episode, unspecified: Secondary | ICD-10-CM | POA: Diagnosis present

## 2015-03-29 DIAGNOSIS — I10 Essential (primary) hypertension: Secondary | ICD-10-CM | POA: Diagnosis present

## 2015-03-29 DIAGNOSIS — E46 Unspecified protein-calorie malnutrition: Secondary | ICD-10-CM | POA: Diagnosis present

## 2015-03-29 DIAGNOSIS — L89159 Pressure ulcer of sacral region, unspecified stage: Secondary | ICD-10-CM | POA: Diagnosis present

## 2015-03-29 DIAGNOSIS — J9621 Acute and chronic respiratory failure with hypoxia: Secondary | ICD-10-CM | POA: Diagnosis present

## 2015-03-29 DIAGNOSIS — E785 Hyperlipidemia, unspecified: Secondary | ICD-10-CM | POA: Diagnosis present

## 2015-03-29 DIAGNOSIS — E8889 Other specified metabolic disorders: Secondary | ICD-10-CM | POA: Diagnosis present

## 2015-03-29 DIAGNOSIS — L98429 Non-pressure chronic ulcer of back with unspecified severity: Secondary | ICD-10-CM | POA: Diagnosis present

## 2015-03-29 DIAGNOSIS — B9561 Methicillin susceptible Staphylococcus aureus infection as the cause of diseases classified elsewhere: Secondary | ICD-10-CM | POA: Insufficient documentation

## 2015-03-29 DIAGNOSIS — E119 Type 2 diabetes mellitus without complications: Secondary | ICD-10-CM

## 2015-03-29 DIAGNOSIS — I340 Nonrheumatic mitral (valve) insufficiency: Secondary | ICD-10-CM | POA: Diagnosis present

## 2015-03-29 DIAGNOSIS — E1165 Type 2 diabetes mellitus with hyperglycemia: Secondary | ICD-10-CM

## 2015-03-29 DIAGNOSIS — K922 Gastrointestinal hemorrhage, unspecified: Secondary | ICD-10-CM | POA: Diagnosis present

## 2015-03-29 DIAGNOSIS — E872 Acidosis, unspecified: Secondary | ICD-10-CM | POA: Diagnosis present

## 2015-03-29 DIAGNOSIS — I701 Atherosclerosis of renal artery: Secondary | ICD-10-CM | POA: Diagnosis present

## 2015-03-29 DIAGNOSIS — F0280 Dementia in other diseases classified elsewhere without behavioral disturbance: Secondary | ICD-10-CM | POA: Diagnosis present

## 2015-03-29 DIAGNOSIS — R0689 Other abnormalities of breathing: Secondary | ICD-10-CM

## 2015-03-29 DIAGNOSIS — D638 Anemia in other chronic diseases classified elsewhere: Secondary | ICD-10-CM | POA: Diagnosis present

## 2015-03-29 DIAGNOSIS — Z66 Do not resuscitate: Secondary | ICD-10-CM | POA: Diagnosis not present

## 2015-03-29 DIAGNOSIS — Z794 Long term (current) use of insulin: Secondary | ICD-10-CM

## 2015-03-29 DIAGNOSIS — Z7982 Long term (current) use of aspirin: Secondary | ICD-10-CM

## 2015-03-29 DIAGNOSIS — J449 Chronic obstructive pulmonary disease, unspecified: Secondary | ICD-10-CM | POA: Diagnosis present

## 2015-03-29 DIAGNOSIS — Q279 Congenital malformation of peripheral vascular system, unspecified: Secondary | ICD-10-CM

## 2015-03-29 DIAGNOSIS — E11319 Type 2 diabetes mellitus with unspecified diabetic retinopathy without macular edema: Secondary | ICD-10-CM | POA: Diagnosis present

## 2015-03-29 DIAGNOSIS — E1122 Type 2 diabetes mellitus with diabetic chronic kidney disease: Secondary | ICD-10-CM | POA: Diagnosis present

## 2015-03-29 DIAGNOSIS — I130 Hypertensive heart and chronic kidney disease with heart failure and stage 1 through stage 4 chronic kidney disease, or unspecified chronic kidney disease: Secondary | ICD-10-CM | POA: Diagnosis present

## 2015-03-29 DIAGNOSIS — J9600 Acute respiratory failure, unspecified whether with hypoxia or hypercapnia: Secondary | ICD-10-CM | POA: Diagnosis present

## 2015-03-29 DIAGNOSIS — Z79899 Other long term (current) drug therapy: Secondary | ICD-10-CM

## 2015-03-29 DIAGNOSIS — Z515 Encounter for palliative care: Secondary | ICD-10-CM | POA: Insufficient documentation

## 2015-03-29 DIAGNOSIS — Z7902 Long term (current) use of antithrombotics/antiplatelets: Secondary | ICD-10-CM

## 2015-03-29 DIAGNOSIS — D509 Iron deficiency anemia, unspecified: Secondary | ICD-10-CM | POA: Diagnosis present

## 2015-03-29 DIAGNOSIS — E1151 Type 2 diabetes mellitus with diabetic peripheral angiopathy without gangrene: Secondary | ICD-10-CM | POA: Diagnosis present

## 2015-03-29 DIAGNOSIS — Z9981 Dependence on supplemental oxygen: Secondary | ICD-10-CM

## 2015-03-29 DIAGNOSIS — J189 Pneumonia, unspecified organism: Secondary | ICD-10-CM

## 2015-03-29 DIAGNOSIS — N184 Chronic kidney disease, stage 4 (severe): Secondary | ICD-10-CM | POA: Diagnosis present

## 2015-03-29 DIAGNOSIS — E440 Moderate protein-calorie malnutrition: Secondary | ICD-10-CM | POA: Insufficient documentation

## 2015-03-29 DIAGNOSIS — R7881 Bacteremia: Secondary | ICD-10-CM | POA: Diagnosis present

## 2015-03-29 DIAGNOSIS — B9562 Methicillin resistant Staphylococcus aureus infection as the cause of diseases classified elsewhere: Secondary | ICD-10-CM | POA: Diagnosis present

## 2015-03-29 DIAGNOSIS — G3183 Dementia with Lewy bodies: Secondary | ICD-10-CM | POA: Diagnosis present

## 2015-03-29 DIAGNOSIS — I5032 Chronic diastolic (congestive) heart failure: Secondary | ICD-10-CM | POA: Diagnosis present

## 2015-03-29 DIAGNOSIS — Y95 Nosocomial condition: Secondary | ICD-10-CM | POA: Diagnosis present

## 2015-03-29 DIAGNOSIS — J440 Chronic obstructive pulmonary disease with acute lower respiratory infection: Principal | ICD-10-CM | POA: Diagnosis present

## 2015-03-29 DIAGNOSIS — Z8249 Family history of ischemic heart disease and other diseases of the circulatory system: Secondary | ICD-10-CM

## 2015-03-29 DIAGNOSIS — Z87891 Personal history of nicotine dependence: Secondary | ICD-10-CM

## 2015-03-29 DIAGNOSIS — J690 Pneumonitis due to inhalation of food and vomit: Secondary | ICD-10-CM

## 2015-03-29 DIAGNOSIS — D631 Anemia in chronic kidney disease: Secondary | ICD-10-CM | POA: Diagnosis present

## 2015-03-29 DIAGNOSIS — R7989 Other specified abnormal findings of blood chemistry: Secondary | ICD-10-CM | POA: Diagnosis present

## 2015-03-29 DIAGNOSIS — R627 Adult failure to thrive: Secondary | ICD-10-CM | POA: Diagnosis present

## 2015-03-29 DIAGNOSIS — I739 Peripheral vascular disease, unspecified: Secondary | ICD-10-CM | POA: Diagnosis present

## 2015-03-29 DIAGNOSIS — R918 Other nonspecific abnormal finding of lung field: Secondary | ICD-10-CM

## 2015-03-29 LAB — MAGNESIUM: Magnesium: 1.2 mg/dL — ABNORMAL LOW (ref 1.7–2.4)

## 2015-03-29 LAB — INFLUENZA PANEL BY PCR (TYPE A & B)
H1N1 flu by pcr: NOT DETECTED
INFLAPCR: NEGATIVE
INFLBPCR: NEGATIVE

## 2015-03-29 LAB — CBC
HCT: 28.4 % — ABNORMAL LOW (ref 36.0–46.0)
HEMOGLOBIN: 9.5 g/dL — AB (ref 12.0–15.0)
MCH: 29.4 pg (ref 26.0–34.0)
MCHC: 33.5 g/dL (ref 30.0–36.0)
MCV: 87.9 fL (ref 78.0–100.0)
PLATELETS: 222 10*3/uL (ref 150–400)
RBC: 3.23 MIL/uL — ABNORMAL LOW (ref 3.87–5.11)
RDW: 15.2 % (ref 11.5–15.5)
WBC: 13.5 10*3/uL — ABNORMAL HIGH (ref 4.0–10.5)

## 2015-03-29 LAB — IRON AND TIBC
Iron: 9 ug/dL — ABNORMAL LOW (ref 28–170)
Saturation Ratios: 5 % — ABNORMAL LOW (ref 10.4–31.8)
TIBC: 171 ug/dL — AB (ref 250–450)
UIBC: 162 ug/dL

## 2015-03-29 LAB — BASIC METABOLIC PANEL
Anion gap: 17 — ABNORMAL HIGH (ref 5–15)
BUN: 55 mg/dL — AB (ref 6–20)
CALCIUM: 5.4 mg/dL — AB (ref 8.9–10.3)
CO2: 18 mmol/L — AB (ref 22–32)
CREATININE: 3.71 mg/dL — AB (ref 0.44–1.00)
Chloride: 103 mmol/L (ref 101–111)
GFR calc Af Amer: 14 mL/min — ABNORMAL LOW (ref >60)
GFR calc non Af Amer: 12 mL/min — ABNORMAL LOW (ref >60)
GLUCOSE: 153 mg/dL — AB (ref 65–99)
Potassium: 3.6 mmol/L (ref 3.5–5.1)
Sodium: 138 mmol/L (ref 135–145)

## 2015-03-29 LAB — RETICULOCYTES
RBC.: 3 MIL/uL — ABNORMAL LOW (ref 3.87–5.11)
Retic Count, Absolute: 36 10*3/uL (ref 19.0–186.0)
Retic Ct Pct: 1.2 % (ref 0.4–3.1)

## 2015-03-29 LAB — BRAIN NATRIURETIC PEPTIDE: B Natriuretic Peptide: 384.2 pg/mL — ABNORMAL HIGH (ref 0.0–100.0)

## 2015-03-29 LAB — FERRITIN: FERRITIN: 360 ng/mL — AB (ref 11–307)

## 2015-03-29 LAB — VITAMIN B12: VITAMIN B 12: 1290 pg/mL — AB (ref 180–914)

## 2015-03-29 LAB — PHOSPHORUS: PHOSPHORUS: 3.7 mg/dL (ref 2.5–4.6)

## 2015-03-29 LAB — FOLATE: Folate: 12 ng/mL (ref >5.9)

## 2015-03-29 LAB — LACTIC ACID, PLASMA: LACTIC ACID, VENOUS: 1.3 mmol/L (ref 0.5–2.0)

## 2015-03-29 MED ORDER — CARVEDILOL 3.125 MG PO TABS
3.1250 mg | ORAL_TABLET | Freq: Two times a day (BID) | ORAL | Status: DC
  Administered 2015-03-29 – 2015-04-07 (×19): 3.125 mg via ORAL
  Filled 2015-03-29 (×19): qty 1

## 2015-03-29 MED ORDER — ALBUTEROL SULFATE (2.5 MG/3ML) 0.083% IN NEBU
5.0000 mg | INHALATION_SOLUTION | Freq: Once | RESPIRATORY_TRACT | Status: AC
  Administered 2015-03-29: 5 mg via RESPIRATORY_TRACT
  Filled 2015-03-29: qty 6

## 2015-03-29 MED ORDER — MAGNESIUM SULFATE 2 GM/50ML IV SOLN
2.0000 g | Freq: Once | INTRAVENOUS | Status: AC
  Administered 2015-03-29: 2 g via INTRAVENOUS
  Filled 2015-03-29: qty 50

## 2015-03-29 MED ORDER — ALBUTEROL SULFATE (2.5 MG/3ML) 0.083% IN NEBU: 3.0000 mL | INHALATION_SOLUTION | Freq: Four times a day (QID) | RESPIRATORY_TRACT | Status: DC | PRN

## 2015-03-29 MED ORDER — ASPIRIN EC 81 MG PO TBEC: 81.0000 mg | DELAYED_RELEASE_TABLET | Freq: Every day | ORAL | Status: DC

## 2015-03-29 MED ORDER — SODIUM CHLORIDE 0.9% FLUSH
3.0000 mL | Freq: Two times a day (BID) | INTRAVENOUS | Status: DC
  Administered 2015-03-29 – 2015-04-07 (×16): 3 mL via INTRAVENOUS

## 2015-03-29 MED ORDER — ACETAMINOPHEN 650 MG RE SUPP: 650.0000 mg | Freq: Four times a day (QID) | RECTAL | Status: DC | PRN

## 2015-03-29 MED ORDER — OMEGA-3 FATTY ACIDS 1000 MG PO CAPS: 1.0000 g | ORAL_CAPSULE | Freq: Three times a day (TID) | ORAL | Status: DC

## 2015-03-29 MED ORDER — CLOPIDOGREL BISULFATE 75 MG PO TABS
75.0000 mg | ORAL_TABLET | Freq: Every day | ORAL | Status: DC
  Administered 2015-03-30 – 2015-04-07 (×9): 75 mg via ORAL
  Filled 2015-03-29 (×9): qty 1

## 2015-03-29 MED ORDER — FAMOTIDINE 20 MG PO TABS
20.0000 mg | ORAL_TABLET | Freq: Every day | ORAL | Status: DC
  Administered 2015-03-30 – 2015-04-07 (×9): 20 mg via ORAL
  Filled 2015-03-29 (×10): qty 1

## 2015-03-29 MED ORDER — FUROSEMIDE 20 MG PO TABS
20.0000 mg | ORAL_TABLET | Freq: Every day | ORAL | Status: DC
  Administered 2015-03-30 – 2015-04-07 (×9): 20 mg via ORAL
  Filled 2015-03-29 (×9): qty 1

## 2015-03-29 MED ORDER — CALCIUM CARBONATE 1250 (500 CA) MG PO TABS
1.0000 | ORAL_TABLET | Freq: Every day | ORAL | Status: DC
  Administered 2015-03-29 – 2015-03-31 (×3): 500 mg via ORAL
  Filled 2015-03-29 (×3): qty 1

## 2015-03-29 MED ORDER — ENSURE ENLIVE PO LIQD
237.0000 mL | ORAL | Status: DC
  Administered 2015-03-29: 237 mL via ORAL

## 2015-03-29 MED ORDER — FERROUS SULFATE 325 (65 FE) MG PO TABS
325.0000 mg | ORAL_TABLET | Freq: Three times a day (TID) | ORAL | Status: DC
  Administered 2015-03-29 – 2015-04-07 (×27): 325 mg via ORAL
  Filled 2015-03-29 (×27): qty 1

## 2015-03-29 MED ORDER — ATORVASTATIN CALCIUM 80 MG PO TABS
80.0000 mg | ORAL_TABLET | Freq: Every day | ORAL | Status: DC
  Administered 2015-03-29 – 2015-04-07 (×10): 80 mg via ORAL
  Filled 2015-03-29 (×10): qty 1

## 2015-03-29 MED ORDER — OMEGA-3-ACID ETHYL ESTERS 1 G PO CAPS
1.0000 g | ORAL_CAPSULE | Freq: Three times a day (TID) | ORAL | Status: DC
  Administered 2015-03-29 – 2015-04-07 (×27): 1 g via ORAL
  Filled 2015-03-29 (×27): qty 1

## 2015-03-29 MED ORDER — DARBEPOETIN ALFA 40 MCG/0.4ML IJ SOSY
40.0000 ug | PREFILLED_SYRINGE | INTRAMUSCULAR | Status: DC
  Administered 2015-03-29 – 2015-04-05 (×2): 40 ug via SUBCUTANEOUS
  Filled 2015-03-29 (×3): qty 0.4

## 2015-03-29 MED ORDER — VANCOMYCIN HCL IN DEXTROSE 1-5 GM/200ML-% IV SOLN
1000.0000 mg | INTRAVENOUS | Status: DC
  Administered 2015-03-31: 1000 mg via INTRAVENOUS
  Filled 2015-03-29: qty 200

## 2015-03-29 MED ORDER — BENZONATATE 100 MG PO CAPS
100.0000 mg | ORAL_CAPSULE | Freq: Three times a day (TID) | ORAL | Status: DC | PRN
  Filled 2015-03-29: qty 1

## 2015-03-29 MED ORDER — VITAMIN D 1000 UNITS PO TABS
1000.0000 [IU] | ORAL_TABLET | Freq: Every day | ORAL | Status: DC
  Administered 2015-03-30 – 2015-04-07 (×9): 1000 [IU] via ORAL
  Filled 2015-03-29 (×10): qty 1

## 2015-03-29 MED ORDER — VITAMIN D (ERGOCALCIFEROL) 1.25 MG (50000 UNIT) PO CAPS
50000.0000 [IU] | ORAL_CAPSULE | Freq: Once | ORAL | Status: AC
  Administered 2015-03-29: 50000 [IU] via ORAL
  Filled 2015-03-29: qty 1

## 2015-03-29 MED ORDER — ACETAMINOPHEN 325 MG PO TABS
650.0000 mg | ORAL_TABLET | Freq: Four times a day (QID) | ORAL | Status: DC | PRN
Start: 2015-03-29 — End: 2015-04-08
  Administered 2015-03-29 – 2015-04-07 (×12): 650 mg via ORAL
  Filled 2015-03-29 (×14): qty 2

## 2015-03-29 MED ORDER — SODIUM CHLORIDE 0.9 % IV SOLN
1.0000 g | Freq: Once | INTRAVENOUS | Status: AC
  Administered 2015-03-29: 1 g via INTRAVENOUS
  Filled 2015-03-29 (×2): qty 10

## 2015-03-29 MED ORDER — SODIUM BICARBONATE 650 MG PO TABS
650.0000 mg | ORAL_TABLET | Freq: Two times a day (BID) | ORAL | Status: DC
  Administered 2015-03-29 – 2015-04-07 (×19): 650 mg via ORAL
  Filled 2015-03-29 (×20): qty 1

## 2015-03-29 MED ORDER — ONDANSETRON HCL 4 MG PO TABS: 4.0000 mg | ORAL_TABLET | Freq: Four times a day (QID) | ORAL | Status: DC | PRN

## 2015-03-29 MED ORDER — COLLAGENASE 250 UNIT/GM EX OINT
TOPICAL_OINTMENT | Freq: Every day | CUTANEOUS | Status: DC
  Administered 2015-03-29 – 2015-04-07 (×9): via TOPICAL
  Filled 2015-03-29: qty 30

## 2015-03-29 MED ORDER — ISOSORBIDE MONONITRATE ER 30 MG PO TB24
30.0000 mg | ORAL_TABLET | Freq: Every day | ORAL | Status: DC
  Administered 2015-03-30 – 2015-04-07 (×9): 30 mg via ORAL
  Filled 2015-03-29 (×9): qty 1

## 2015-03-29 MED ORDER — VITAMIN D 1000 UNITS PO TABS: 1000.0000 [IU] | ORAL_TABLET | Freq: Every day | ORAL | Status: DC

## 2015-03-29 MED ORDER — SODIUM CHLORIDE 0.9 % IV SOLN
INTRAVENOUS | Status: AC
  Administered 2015-03-29: 17:00:00 via INTRAVENOUS

## 2015-03-29 MED ORDER — MOMETASONE FURO-FORMOTEROL FUM 100-5 MCG/ACT IN AERO
1.0000 | INHALATION_SPRAY | Freq: Every day | RESPIRATORY_TRACT | Status: DC
  Administered 2015-03-30 – 2015-04-07 (×6): 1 via RESPIRATORY_TRACT
  Filled 2015-03-29: qty 8.8

## 2015-03-29 MED ORDER — TAMSULOSIN HCL 0.4 MG PO CAPS
0.4000 mg | ORAL_CAPSULE | Freq: Every day | ORAL | Status: DC
  Administered 2015-03-30 – 2015-04-07 (×9): 0.4 mg via ORAL
  Filled 2015-03-29 (×9): qty 1

## 2015-03-29 MED ORDER — ONDANSETRON HCL 4 MG/2ML IJ SOLN: 4.0000 mg | Freq: Four times a day (QID) | INTRAMUSCULAR | Status: DC | PRN

## 2015-03-29 MED ORDER — AMLODIPINE BESYLATE 5 MG PO TABS
5.0000 mg | ORAL_TABLET | Freq: Every day | ORAL | Status: DC
  Administered 2015-03-30 – 2015-04-07 (×9): 5 mg via ORAL
  Filled 2015-03-29 (×9): qty 1

## 2015-03-29 MED ORDER — VANCOMYCIN HCL 10 G IV SOLR
1250.0000 mg | Freq: Once | INTRAVENOUS | Status: AC
  Administered 2015-03-29: 1250 mg via INTRAVENOUS
  Filled 2015-03-29 (×2): qty 1250

## 2015-03-29 MED ORDER — ONDANSETRON HCL 4 MG/2ML IJ SOLN: 4.0000 mg | Freq: Three times a day (TID) | INTRAMUSCULAR | Status: DC | PRN

## 2015-03-29 MED ORDER — DEXTROSE 5 % IV SOLN
1.0000 g | INTRAVENOUS | Status: DC
  Administered 2015-03-30 – 2015-04-01 (×3): 1 g via INTRAVENOUS
  Filled 2015-03-29 (×3): qty 1

## 2015-03-29 MED ORDER — GUAIFENESIN ER 600 MG PO TB12
600.0000 mg | ORAL_TABLET | Freq: Two times a day (BID) | ORAL | Status: DC
  Administered 2015-03-29 – 2015-04-07 (×18): 600 mg via ORAL
  Filled 2015-03-29 (×19): qty 1

## 2015-03-29 MED ORDER — SERTRALINE HCL 50 MG PO TABS
50.0000 mg | ORAL_TABLET | Freq: Every day | ORAL | Status: DC
  Administered 2015-03-30 – 2015-04-07 (×9): 50 mg via ORAL
  Filled 2015-03-29 (×9): qty 1

## 2015-03-29 MED ORDER — IRON 325 (65 FE) MG PO TABS: 325.0000 mg | ORAL_TABLET | Freq: Three times a day (TID) | ORAL | Status: DC

## 2015-03-29 MED ORDER — DEXTROSE 5 % IV SOLN
1.0000 g | Freq: Once | INTRAVENOUS | Status: AC
  Administered 2015-03-29: 1 g via INTRAVENOUS
  Filled 2015-03-29: qty 1

## 2015-03-29 NOTE — ED Notes (Signed)
D/c'd from [facility] on Thursday, states has not gotten any better and has been running a fever. Moist productive cough.

## 2015-03-29 NOTE — Progress Notes (Addendum)
ANTIBIOTIC CONSULT NOTE - INITIAL  Pharmacy Consult for Vancomycin/Cefepime - renal dosing Indication: pneumonia  No Known Allergies  Patient Measurements: Height: 4\' 10"  (147.3 cm) Weight: 140 lb (63.504 kg) IBW/kg (Calculated) : 40.9  Vital Signs: Temp: 98 F (36.7 C) (02/05 1028) Temp Source: Oral (02/05 1028) BP: 148/61 mmHg (02/05 1230) Pulse Rate: 78 (02/05 1230)  Labs:  Recent Labs  03/29/15 1043  WBC 13.5*  HGB 9.5*  PLT 222  CREATININE 3.71*   Estimated Creatinine Clearance: 12.1 mL/min (by C-G formula based on Cr of 3.71).  Medical History: Past Medical History  Diagnosis Date  . Peripheral vascular disease ([place])     a.s /p left common iliac and external iliac artery stents. b. H/o subclavian, mesenteric, and celiac artery stenosis (see below).  . Diabetes mellitus   . COPD (chronic obstructive pulmonary disease) (HCC)     home oxygen  . Hyperlipidemia   . Depression   . Chronic diastolic CHF (congestive heart failure) ([place])   . Essential hypertension   . Renal artery stenosis ([place])     a. Duplex 2014: no obvious evidence of hemodynamically significant stenosis >60%, bilateral intrarenal arteries exhibit absent diastolic flow. There is evidence of elevated velocities of the mid aorta. 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  [name] CAD (coronary artery disease)   . Iron deficiency anemia   . Diabetic retinopathy ([place])   . 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 Carotid artery disease ([place])     a. Duplex 12/2014: left CEA patent with elevated velocities, 60-79% RICA (stable over serial exams), 123456 LICA stenosis (stable over serial exams), 50% LECA, elevated velocities in bilateral subclavian arteries.  . Hypertensive heart disease with congestive heart failure ([place])   . CKD (chronic kidney disease), stage III   .  Tobacco abuse   . Bradycardia     a. Limiting BB titration.  . Anemia, chronic disease 05/16/2008    Qualifier: Diagnosis of  By: Percival Spanish, MD, Farrel Gordon    . Dementia   . Protein-calorie malnutrition ([place]) 01/24/2015   Medications:  Anti-infectives    Start     Dose/Rate Route Frequency Ordered Stop   03/29/15 1400  ceFEPIme (MAXIPIME) 1 g in dextrose 5 % 50 mL IVPB     1 g 100 mL/hr over 30 Minutes Intravenous 3 times per day 03/29/15 1252 04/06/15 1359     Assessment: 65 yo female recently discharged from [facility] (Thursday) and comes in today with productive cough stating that she is no better.  Per patient - fever ongoing.  We have been asked to dose her Vancomycin and to renal adjust her Cefepime.  She has stage III CKD with creatinine today of 3.7 with an estimated crcl of 63ml/min.  She will require dose adjustment of Cefepime and Vancomycin.  Goal of Therapy:  Vancomycin trough level 15-20 mcg/ml  Plan:  - Cefepime 1gm x 1 now then every 24 hours - Vancomycin 1250mg  IV x 1 then 1 gm every 48 hours. - Monitor clinical response, steady state levels and culture data - Monitor renal fxn. and adjust doses as needed  Rober Minion, PharmD., MS Clinical Pharmacist Pager:  [phone] Thank you for allowing pharmacy to be part of this patients care team. 03/29/2015,12:55 PM  Addendum: Was asked by NP to add Darbopoetin to regimen for chronic anemia.  She has chronic renal insufficiency and a history of AVM with GI bleed.  She was recently admitted to [facility] for this reason.  PLAN: - Will start with lower dose first to see how she would respond (dc if Hgb > 10.5) - Darbopoetin 42mcg weekly for now - Monitor response and adjust - f/u plans with renal if discharged  Rober Minion, PharmD., [place] Clinical Pharmacist Pager:  [phone]

## 2015-03-29 NOTE — Consult Note (Signed)
WOC wound consult note Reason for Consult:readmitted today.  Seen x2 by [facility] nurses, this writer on 1/29 and my partner on 03/26/15. Wound type:Pressure Pressure Ulcer POA: Yes Measurement:3cm x 3.8cm with depth obscured by the presence of necrotic yellow slough. Wound bed:See above Drainage (amount, consistency, odor) yellow, serous Periwound:intact, with mild erythema to 1cm circumferentially Dressing procedure/placement/frequency:i will continue the collagenase used in the recent past covered with moist gauze. Upon discharge, recommend followup by MD with perhaps serial debridements.  Might also consider referral to outpatient wound care center or [facility]. I will not restart PT with hydrotherapy at this time as little process was made. Discussed care with patient, no family in room.  She is in agreement with POC.  Patient stays turned side to side and does not lie in the supine position due to discomfort. [place] nursing team will not follow, but will remain available to this patient, the nursing and medical teams.  Please re-consult if needed. Thanks, Maudie Flakes, MSN, RN, [place], Arther Abbott  Pager# [phone]

## 2015-03-29 NOTE — ED Notes (Signed)
Spoke with lab, they will add on BNP and Mg.

## 2015-03-29 NOTE — ED Provider Notes (Signed)
CSN: [id number]     Arrival date & time 03/29/15  1006 History   First MD Initiated Contact with Patient 03/29/15 1118     Chief Complaint  Patient presents with  . Shortness of Breath      HPI  Expand All Collapse All   D/c'd from [facility] on Thursday, states has not gotten any better and has been running a fever. Moist productive cough.        Past Medical History  Diagnosis Date  . Peripheral vascular disease ([place])     a.s /p left common iliac and external iliac artery stents. b. H/o subclavian, mesenteric, and celiac artery stenosis (see below).  . Diabetes mellitus   . COPD (chronic obstructive pulmonary disease) (HCC)     home oxygen  . Hyperlipidemia   . Depression   . Chronic diastolic CHF (congestive heart failure) ([place])   . Essential hypertension   . Renal artery stenosis ([place])     a. Duplex 2014: no obvious evidence of hemodynamically significant stenosis >60%, bilateral intrarenal arteries exhibit absent diastolic flow. There is evidence of elevated velocities of the mid aorta. 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  [name] CAD (coronary artery disease)   . Iron deficiency anemia   . Diabetic retinopathy ([place])   . 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 Carotid artery disease ([place])     a. Duplex 12/2014: left CEA patent with elevated velocities, 60-79% RICA (stable over serial exams), 123456 LICA stenosis (stable over serial exams), 50% LECA, elevated velocities in bilateral subclavian arteries.  . Hypertensive heart disease with congestive heart failure ([place])   . CKD (chronic kidney disease), stage III   . Tobacco abuse   . Bradycardia     a. Limiting BB titration.  . Anemia, chronic disease 05/16/2008    Qualifier: Diagnosis of  By: Percival Spanish, MD, Farrel Gordon    . Dementia   . Protein-calorie malnutrition ([place]) 01/24/2015   Past  Surgical History  Procedure Laterality Date  . Carotid endarterectomy    . Cholecystectomy    . Appendectomy    . Tonsillectomy    . Cesarean section    . Shoulder arthroscopy    . Iliac artery stent      left  . Enteroscopy  01/26/2012    Procedure: ENTEROSCOPY;  Surgeon: Gatha Mayer, MD;  Location: [facility] ENDOSCOPY;  Service: Endoscopy;  Laterality: N/A;  . Cardiac catheterization N/A 01/23/2015    Procedure: Left Heart Cath and Coronary Angiography;  Surgeon: [name], MD;  Location: [facility];  Service: Cardiovascular;  Laterality: N/A;  . Enteroscopy N/A 03/25/2015    Procedure: ENTEROSCOPY;  Surgeon: Doran Stabler, MD;  Location: [facility] ENDOSCOPY;  Service: Endoscopy;  Laterality: N/A;   Family History  Problem Relation Age of Onset  . Hypertension Mother   . Heart attack Mother   . Hypertension Father   . Angina Father   . Cancer Sister   . Cancer Brother   . Stroke Neg Hx    Social History  Substance Use Topics  . Smoking status: Former Smoker -- 0.00 packs/day for 36 years    Types: Cigarettes    Quit date: 02/11/2015  . Smokeless tobacco: Never Used     Comment: uses e-cig  . Alcohol Use: No   OB History  No data available     Review of Systems  Constitutional: Positive for fever. Negative for activity change.  Respiratory: Positive for shortness of breath and wheezing.       Allergies  Review of patient's allergies indicates no known allergies.  Home Medications   Prior to Admission medications   Medication Sig Start Date End Date Taking? Authorizing Provider  albuterol (PROVENTIL HFA;VENTOLIN HFA) 108 (90 BASE) MCG/ACT inhaler Inhale 2 puffs into the lungs every 6 (six) hours as needed for wheezing or shortness of breath. 02/17/15  Yes Arnoldo Morale, MD  amLODipine (NORVASC) 10 MG tablet Take 0.5 tablets (5 mg total) by mouth daily. 03/26/15  Yes Lavina Hamman, MD  aspirin EC 81 MG tablet Take 1 tablet (81 mg total) by mouth daily.  02/17/15  Yes Arnoldo Morale, MD  atorvastatin (LIPITOR) 80 MG tablet Take 1 tablet (80 mg total) by mouth daily at 6 PM. 02/17/15  Yes Arnoldo Morale, MD  benzonatate (TESSALON) 100 MG capsule Take 1 capsule (100 mg total) by mouth 3 (three) times daily as needed for cough. 03/26/15  Yes Lavina Hamman, MD  calcium-vitamin D 250-100 MG-UNIT tablet Take 1 tablet by mouth daily. 03/26/15  Yes Lavina Hamman, MD  carvedilol (COREG) 3.125 MG tablet Take 1 tablet (3.125 mg total) by mouth 2 (two) times daily with a meal. 03/12/15  Yes Ripudeep K Rai, MD  clopidogrel (PLAVIX) 75 MG tablet Take 1 tablet (75 mg total) by mouth daily. 02/17/15  Yes Arnoldo Morale, MD  famotidine (PEPCID) 20 MG tablet Take 1 tablet (20 mg total) by mouth daily. 03/12/15  Yes Ripudeep Krystal Eaton, MD  feeding supplement, ENSURE ENLIVE, (ENSURE ENLIVE) LIQD Take 237 mLs by mouth daily. 03/26/15  Yes Lavina Hamman, MD  Ferrous Sulfate (IRON) 325 (65 FE) MG TABS Take 325 mg by mouth 3 (three) times daily. 02/17/15  Yes Arnoldo Morale, MD  fish oil-omega-3 fatty acids 1000 MG capsule Take 1 g by mouth 3 (three) times daily. Three times daily   Yes Historical Provider, MD  furosemide (LASIX) 20 MG tablet Take 1 tablet (20 mg total) by mouth daily. 03/26/15  Yes Lavina Hamman, MD  guaiFENesin (MUCINEX) 600 MG 12 hr tablet Take 1 tablet (600 mg total) by mouth 2 (two) times daily. 03/26/15  Yes Lavina Hamman, MD  hydrocortisone 2.5 % lotion Apply topically 2 (two) times daily. 01/08/15  Yes Dorena Dew, FNP  insulin aspart (NOVOLOG) 100 UNIT/ML injection Before each meal 3 times a day, 140-199 - 2 units, 200-250 - 4 units, 251-299 - 6 units,  300-349 - 8 units,  350 or above 10 units. Dispense syringes and needles as needed, Ok to switch to PEN if approved. Substitute to any brand approved. DX DM2, Code E11.65 03/09/15  Yes Thurnell Lose, MD  isosorbide mononitrate (IMDUR) 30 MG 24 hr tablet Take 1 tablet (30 mg total) by mouth daily. 02/17/15  Yes  Arnoldo Morale, MD  mometasone-formoterol (DULERA) 100-5 MCG/ACT AERO Inhale 1 puff into the lungs daily. 03/27/15  Yes Arnoldo Morale, MD  nitroGLYCERIN (NITROSTAT) 0.4 MG SL tablet Place 1 tablet (0.4 mg total) under the tongue every 5 (five) minutes as needed for chest pain. 01/28/15  Yes Delfina Redwood, MD  sertraline (ZOLOFT) 50 MG tablet Take 1 tablet (50 mg total) by mouth daily. 02/04/15  Yes Arnoldo Morale, MD  sodium bicarbonate 650 MG tablet Take 1 tablet (650 mg total) by mouth  2 (two) times daily. 03/26/15  Yes Lavina Hamman, MD  tamsulosin (FLOMAX) 0.4 MG CAPS capsule Take 1 capsule (0.4 mg total) by mouth daily. 03/12/15  Yes Ripudeep K Rai, MD   BP 148/61 mmHg  Pulse 78  Temp(Src) 98 F (36.7 C) (Oral)  Resp 11  Ht 4\' 10"  (1.473 m)  Wt 140 lb (63.504 kg)  BMI 29.27 kg/m2  SpO2 94% Physical Exam  Constitutional: She is oriented to person, place, and time. She appears well-developed and well-nourished. No distress.  HENT:  Head: Normocephalic and atraumatic.  Eyes: Pupils are equal, round, and reactive to light.  Neck: Normal range of motion.  Cardiovascular: Normal rate and intact distal pulses.   Pulmonary/Chest: She has wheezes.  Abdominal: Normal appearance. She exhibits no distension. There is no tenderness. There is no rebound.  Musculoskeletal: Normal range of motion.  Neurological: She is alert and oriented to person, place, and time. No cranial nerve deficit.  Skin: Skin is warm and dry. No rash noted.  Psychiatric: She has a normal mood and affect. Her behavior is normal.  Nursing note and vitals reviewed.   ED Course  Procedures (including critical care time) Labs Review Labs Reviewed  BASIC METABOLIC PANEL - Abnormal; Notable for the following:    CO2 18 (*)    Glucose, Bld 153 (*)    BUN 55 (*)    Creatinine, Ser 3.71 (*)    Calcium 5.4 (*)    GFR calc non Af Amer 12 (*)    GFR calc Af Amer 14 (*)    Anion gap 17 (*)    All other components within  normal limits  CBC - Abnormal; Notable for the following:    WBC 13.5 (*)    RBC 3.23 (*)    Hemoglobin 9.5 (*)    HCT 28.4 (*)    All other components within normal limits  CULTURE, BLOOD (ROUTINE X 2)  CULTURE, BLOOD (ROUTINE X 2)  LACTIC ACID, PLASMA  BRAIN NATRIURETIC PEPTIDE  MAGNESIUM    Imaging Review Dg Chest Portable 1 View  03/29/2015  CLINICAL DATA:  Short of breath EXAM: PORTABLE CHEST 1 VIEW COMPARISON:  03/25/2015 FINDINGS: Partial clearing of bilateral airspace disease with small areas of residual infiltrate bilaterally. No effusion. Heart size mildly enlarged. IMPRESSION: Partial clearing and bilateral airspace disease. Possible pneumonia versus edema. Electronically Signed   By: Franchot Gallo M.D.   On: 03/29/2015 12:04   I have personally reviewed and evaluated these images and lab results as part of my medical decision-making.   EKG Interpretation   Date/Time:  Sunday March 29 2015 10:28:43 EST Ventricular Rate:  79 PR Interval:  124 QRS Duration: 90 QT Interval:  416 QTC Calculation: 477 R Axis:   109 Text Interpretation:  Normal sinus rhythm Rightward axis Possible Anterior  infarct , age undetermined Abnormal ECG No significant change since last  tracing Confirmed by Blair Lundeen  MD, Alvino Lechuga [phone]) on 03/29/2015 11:20:05 AM     Discussed with the hospitalist.  They will admit. MDM   Final diagnoses:  HCAP (healthcare-associated pneumonia)        Leonard Schwartz, MD 03/29/15 1249

## 2015-03-29 NOTE — H&P (Signed)
[facility] History and Physical                                                                                    [name], is a 65 y.o. female  MRN: [id number]   DOB - 1950-09-15  Admit Date - 03/29/2015  Outpatient Primary MD for the patient is Pcp Not In System  Referring MD: Audie Pinto / ER  PMH: Past Medical History  Diagnosis Date  . Peripheral vascular disease ([place])     a.s /p left common iliac and external iliac artery stents. b. H/o subclavian, mesenteric, and celiac artery stenosis (see below).  . Diabetes mellitus   . COPD (chronic obstructive pulmonary disease) (H[facility])     home oxygen  . Hyperlipidemia   . Depression   . Chronic diastolic CHF (congestive heart failure) ([place])   . Essential hypertension   . Renal artery stenosis ([place])     a. Duplex 2014: no obvious evidence of hemodynamically significant stenosis >60%, bilateral intrarenal arteries exhibit absent diastolic flow. There is evidence of elevated velocities of the mid aorta. 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  [name] CAD (coronary artery disease)   . Iron deficiency anemia   . Diabetic retinopathy ([place])   . 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 Carotid artery disease ([place])     a. Duplex 12/2014: left CEA patent with elevated velocities, 60-79% RICA (stable over serial exams), [id number] LICA stenosis (stable over serial exams), 50% LECA, elevated velocities in bilateral subclavian arteries.  . Hypertensive heart disease with congestive heart failure ([place])   . CKD (chronic kidney disease), stage III   . Tobacco abuse   . Bradycardia     a. Limiting BB titration.  . Anemia, chronic disease 05/16/2008    Qualifier: Diagnosis of  By: Percival Spanish, MD, Farrel Gordon    . Dementia   . Protein-calorie malnutrition ([place]) 01/24/2015      PSH: Past Surgical History  Procedure  Laterality Date  . Carotid endarterectomy    . Cholecystectomy    . Appendectomy    . Tonsillectomy    . Cesarean section    . Shoulder arthroscopy    . Iliac artery stent      left  . Enteroscopy  01/26/2012    Procedure: ENTEROSCOPY;  Surgeon: Gatha Mayer, MD;  Location: [facility];  Service: Endoscopy;  Laterality: N/A;  . Cardiac catheterization N/A 01/23/2015    Procedure: Left Heart Cath and Coronary Angiography;  Surgeon: [name], MD;  Location: [facility];  Service: Cardiovascular;  Laterality: N/A;  . Enteroscopy N/A 03/25/2015    Procedure: ENTEROSCOPY;  Surgeon: Doran Stabler, MD;  Location: [facility];  Service: Endoscopy;  Laterality: N/A;     CC:  Chief Complaint  Patient presents with  . Shortness of Breath     HPI: 65 year old female patient with multiple medical problems including renal artery stenosis and associated stage IV chronic kidney disease, COPD on as needed oxygen at home, chronic diastolic  heart failure, hypertension, anemia of chronic disease and iron deficiency anemia, recent admission for GI bleeding symptoms with suspected small bowel etiology, protein calorie nutrition and associated sacral decubitus, CAD, former tobacco abuse, PVD, dyslipidemia, diet-controlled diabetes and Lewy-body dementia. Patient has been admitted twice in the month of January. The initial admission was for hypercalcemia and associated metabolic encephalopathy presumably related to dehydration. Workup at that time revealed no significant etiology to patient's hypercalcemia other than excessive Tums usage and dehydration. She was given a dose of IV pamidronate to aid in reduction of calcium. Patient was readmitted on 1/26 and subsequently discharged on 2/2 for GI bleeding symptomatology. She underwent EGD that did not demonstrate source of bleeding although GI suspected distal intestinal bleeding lesion. She was resumed on Pepcid with instructions to follow-up  with her primary care doctor for further monitoring of hemoglobin and determination regarding outpatient iron supplementation. During the hospitalization patient was found to have metabolic acidosis and was started on sodium bicarbonate orally with correction of acidemia with recommendations to continue bicarbonate for one week and follow-up with primary care physician to determine if this should be maintained as a chronic medication. On this hospitalization patient was found to be hypocalcemic. Her corrected calcium was still low and it was recommended she follow-up the primary care physician to assess initiation of daily calcium replacement.  Patient returns to the ER with 2-3 days of coughing, exertional shortness of breath low-grade fevers. She is producing thick dark mucoid sputum. She reports that when the home health RN came to see her this past Friday she had low-grade temperature. She continues to have dark stools and at times is incontinent and is unable to get to the bathroom. She's had no chest pain, no orthopnea, no lower extremity edema. She reports she only utilizes her oxygen as needed and despite using oxygen and her inhalers her breathing symptoms have not improved.  ER Evaluation and treatment: Patient is afebrile, hemodynamically stable and room air saturations at rest are 95% EKG: Sinus rhythm with ventricular rate 81 bpm, QTC 476 ms, T-wave inversion in lead 3 with subtle downsloping T waves V4-V6 that this is unchanged from previous EKG 03/24/2015 PCXR: Partial clearing of bilateral airspace disease with possible pneumonia versus edema-upon my review of chest x-ray patient has focal opacities in the right upper right middle and infrahilar region Laboratory data: Mg 1.2, phosphorus 3.7, calcium 5.4 with corrected calcium is 6.8, serum CO2 18 with an anion gap of 17, BUN 55 and creatinine 3.71 and this is typical for patient's baseline, BNP 384 with previous reading 524, WBC 13,500,  hemoglobin 9.5 and this is within baseline for patient Albuterol nebulizer 1  Review of Systems   In addition to the HPI above,  No chills, myalgias or other constitutional symptoms No Headache, changes with Vision or hearing, new weakness, tingling, numbness in any extremity, No problems swallowing food or Liquids, indigestion/reflux No Chest pain, palpitations, orthopnea  No Abdominal pain, N/V; no melena or hematochezia No dysuria, hematuria or flank pain No new skin rashes, lesions, masses or bruises, No new joints pains-aches No recent weight gain or loss No polyuria, polydypsia or polyphagia,  *A full 10 point Review of Systems was done, except as stated above, all other Review of Systems were negative.  Social History Social History  Substance Use Topics  . Smoking status: Former Smoker -- 0.00 packs/day for 36 years    Types: Cigarettes    Quit date: 02/11/2015  . Smokeless tobacco:  Never Used     Comment: uses e-cig  . Alcohol Use: No    Resides at: Private residence  Lives with: Spouse  Ambulatory status:   Family History Family History  Problem Relation Age of Onset  . Hypertension Mother   . Heart attack Mother   . Hypertension Father   . Angina Father   . Cancer Sister   . Cancer Brother   . Stroke Neg Hx      Prior to Admission medications   Medication Sig Start Date End Date Taking? Authorizing Provider  albuterol (PROVENTIL HFA;VENTOLIN HFA) 108 (90 BASE) MCG/ACT inhaler Inhale 2 puffs into the lungs every 6 (six) hours as needed for wheezing or shortness of breath. 02/17/15  Yes Arnoldo Morale, MD  amLODipine (NORVASC) 10 MG tablet Take 0.5 tablets (5 mg total) by mouth daily. 03/26/15  Yes Lavina Hamman, MD  aspirin EC 81 MG tablet Take 1 tablet (81 mg total) by mouth daily. 02/17/15  Yes Arnoldo Morale, MD  atorvastatin (LIPITOR) 80 MG tablet Take 1 tablet (80 mg total) by mouth daily at 6 PM. 02/17/15  Yes Arnoldo Morale, MD  benzonatate (TESSALON)  100 MG capsule Take 1 capsule (100 mg total) by mouth 3 (three) times daily as needed for cough. 03/26/15  Yes Lavina Hamman, MD  calcium-vitamin D 250-100 MG-UNIT tablet Take 1 tablet by mouth daily. 03/26/15  Yes Lavina Hamman, MD  carvedilol (COREG) 3.125 MG tablet Take 1 tablet (3.125 mg total) by mouth 2 (two) times daily with a meal. 03/12/15  Yes Ripudeep K Rai, MD  clopidogrel (PLAVIX) 75 MG tablet Take 1 tablet (75 mg total) by mouth daily. 02/17/15  Yes Arnoldo Morale, MD  famotidine (PEPCID) 20 MG tablet Take 1 tablet (20 mg total) by mouth daily. 03/12/15  Yes Ripudeep Krystal Eaton, MD  feeding supplement, ENSURE ENLIVE, (ENSURE ENLIVE) LIQD Take 237 mLs by mouth daily. 03/26/15  Yes Lavina Hamman, MD  Ferrous Sulfate (IRON) 325 (65 FE) MG TABS Take 325 mg by mouth 3 (three) times daily. 02/17/15  Yes Arnoldo Morale, MD  fish oil-omega-3 fatty acids 1000 MG capsule Take 1 g by mouth 3 (three) times daily. Three times daily   Yes Historical Provider, MD  furosemide (LASIX) 20 MG tablet Take 1 tablet (20 mg total) by mouth daily. 03/26/15  Yes Lavina Hamman, MD  guaiFENesin (MUCINEX) 600 MG 12 hr tablet Take 1 tablet (600 mg total) by mouth 2 (two) times daily. 03/26/15  Yes Lavina Hamman, MD  hydrocortisone 2.5 % lotion Apply topically 2 (two) times daily. 01/08/15  Yes Dorena Dew, FNP  insulin aspart (NOVOLOG) 100 UNIT/ML injection Before each meal 3 times a day, 140-199 - 2 units, 200-250 - 4 units, 251-299 - 6 units,  300-349 - 8 units,  350 or above 10 units. Dispense syringes and needles as needed, Ok to switch to PEN if approved. Substitute to any brand approved. DX DM2, Code E11.65 03/09/15  Yes Thurnell Lose, MD  isosorbide mononitrate (IMDUR) 30 MG 24 hr tablet Take 1 tablet (30 mg total) by mouth daily. 02/17/15  Yes Arnoldo Morale, MD  mometasone-formoterol (DULERA) 100-5 MCG/ACT AERO Inhale 1 puff into the lungs daily. 03/27/15  Yes Arnoldo Morale, MD  nitroGLYCERIN (NITROSTAT) 0.4 MG SL tablet  Place 1 tablet (0.4 mg total) under the tongue every 5 (five) minutes as needed for chest pain. 01/28/15  Yes Delfina Redwood, MD  sertraline (ZOLOFT)  50 MG tablet Take 1 tablet (50 mg total) by mouth daily. 02/04/15  Yes Arnoldo Morale, MD  sodium bicarbonate 650 MG tablet Take 1 tablet (650 mg total) by mouth 2 (two) times daily. 03/26/15  Yes Lavina Hamman, MD  tamsulosin (FLOMAX) 0.4 MG CAPS capsule Take 1 capsule (0.4 mg total) by mouth daily. 03/12/15  Yes Ripudeep Krystal Eaton, MD    No Known Allergies  Physical Exam  Vitals  Blood pressure 149/56, pulse 78, temperature 98 F (36.7 C), temperature source Oral, resp. rate 12, height 4\' 10"  (1.473 m), weight 140 lb (63.504 kg), SpO2 95 %.   General:  In no acute distress, appears older than stated age and chronically ill, one unkempt in appearance noting right stool down her legs from previous fecal incontinence  Psych:  Normal affect, Denies Suicidal or Homicidal ideations, Awake Alert, Oriented X 3. Speech and thought patterns are clear and appropriate  Neuro:   No focal neurological deficits, CN II through XII intact, Strength 5/5 all 4 extremities, Sensation intact all 4 extremities.  ENT:  Ears and Eyes appear Normal, Conjunctivae clear, PER. Moist oral mucosa without erythema or exudates.  Neck:  Supple, No lymphadenopathy appreciated  Respiratory:  Symmetrical chest wall movement, slightly diminished air movement bilaterally out wheezing, right basilar expiratory crackles with rhonchi. Room Air 98%  Cardiac:  RRR, No Murmurs, no LE edema noted, no JVD, No carotid bruits, peripheral pulses palpable at 2+  Abdomen:  Positive bowel sounds, Soft, Non tender, Non distended,  No masses appreciated, no obvious hepatosplenomegaly  Skin:  No Cyanosis, Normal Skin Turgor, No Skin Rash or Bruise.  Extremities: Symmetrical without obvious trauma or injury,  no effusions.  Data Review  CBC  Recent Labs Lab 03/23/15 0530 03/24/15 0530  03/25/15 0530 03/26/15 0453 03/29/15 1043  WBC 6.8 6.6 8.2 6.7 13.5*  HGB 8.3* 7.4* 8.9* 9.5* 9.5*  HCT 25.7* 22.6* 27.5* 28.6* 28.4*  PLT 227 212 189 166 222  MCV 91.1 89.3 92.3 91.1 87.9  MCH 29.4 29.2 29.9 30.3 29.4  MCHC 32.3 32.7 32.4 33.2 33.5  RDW 15.2 15.2 15.6* 15.2 15.2  LYMPHSABS  --   --   --  0.7  --   MONOABS  --   --   --  0.3  --   EOSABS  --   --   --  0.1  --   BASOSABS  --   --   --  0.0  --     Chemistries   Recent Labs Lab 03/23/15 0530 03/24/15 0530 03/25/15 0530 03/26/15 0453 03/29/15 1041 03/29/15 1043  NA 138 143 140 138  --  138  K 3.3* 4.5 4.1 3.8  --  3.6  CL 112* 115* 111 109  --  103  CO2 14* 19* 18* 17*  --  18*  GLUCOSE 71 85 104* 130*  --  153*  BUN 47* 53* 46* 44*  --  55*  CREATININE 3.22* 3.30* 3.20* 2.99*  --  3.71*  CALCIUM 6.0* 6.0* 5.8* 5.4*  --  5.4*  MG  --   --   --  1.2* 1.2*  --   AST  --   --   --  21  --   --   ALT  --   --   --  58*  --   --   ALKPHOS  --   --   --  76  --   --  BILITOT  --   --   --  0.6  --   --     estimated creatinine clearance is 12.1 mL/min (by C-G formula based on Cr of 3.71).  No results for input(s): TSH, T4TOTAL, T3FREE, THYROIDAB in the last 72 hours.  Invalid input(s): FREET3  Coagulation profile No results for input(s): INR, PROTIME in the last 168 hours.  No results for input(s): DDIMER in the last 72 hours.  Cardiac Enzymes  Recent Labs Lab 03/24/15 1427 03/24/15 2320 03/25/15 0530  TROPONINI 0.05* 0.04* 0.05*    Invalid input(s): POCBNP  Urinalysis    Component Value Date/Time   COLORURINE YELLOW 03/10/2015 1100   APPEARANCEUR CLOUDY* 03/10/2015 1100   LABSPEC 1.013 03/10/2015 1100   PHURINE 7.5 03/10/2015 1100   GLUCOSEU 250* 03/10/2015 1100   HGBUR MODERATE* 03/10/2015 1100   BILIRUBINUR NEGATIVE 03/10/2015 1100   KETONESUR NEGATIVE 03/10/2015 1100   PROTEINUR >300* 03/10/2015 1100   UROBILINOGEN 0.2 01/08/2015 1215   NITRITE NEGATIVE 03/10/2015 1100    LEUKOCYTESUR NEGATIVE 03/10/2015 1100    Imaging results:   Dg Chest 2 View  03/04/2015  CLINICAL DATA:  Altered mental status today. Bilateral upper extremity weakness. EXAM: CHEST  2 VIEW COMPARISON:  PA and lateral chest 01/21/2015 and 09/07/2012. CT chest 12/14/2011. FINDINGS: There is cardiomegaly and mild vascular congestion. No consolidative process, pneumothorax or effusion is identified. IMPRESSION: No acute disease. Cardiomegaly and vascular congestion. Electronically Signed   By: Inge Rise M.D.   On: 03/04/2015 10:25   Ct Head Wo Contrast  03/04/2015  CLINICAL DATA:  Altered mental status, weakness, difficulty with ambulation EXAM: CT HEAD WITHOUT CONTRAST TECHNIQUE: Contiguous axial images were obtained from the base of the skull through the vertex without intravenous contrast. COMPARISON:  08/10/2004 FINDINGS: There is no evidence of mass effect, midline shift, or extra-axial fluid collections. There is no evidence of a space-occupying lesion or intracranial hemorrhage. There is no evidence of a cortical-based area of acute infarction. There is generalized cerebral atrophy. There is periventricular white matter low attenuation likely secondary to microangiopathy. The ventricles and sulci are appropriate for the patient's age. The basal cisterns are patent. Visualized portions of the orbits are unremarkable. The visualized portions of the paranasal sinuses and mastoid air cells are unremarkable. Cerebrovascular atherosclerotic calcifications are noted. The osseous structures are unremarkable. IMPRESSION: 1. No acute intracranial pathology. 2. Chronic microvascular disease and cerebral atrophy. Electronically Signed   By: Kathreen Devoid   On: 03/04/2015 10:36   US Renal  03/07/2015  CLINICAL DATA:  Acute kidney failure.  Diabetes and hypertension. EXAM: RENAL / URINARY TRACT ULTRASOUND COMPLETE COMPARISON:  09/08/2012 FINDINGS: Right Kidney: Length: 11.1 cm. Mildly increased parenchymal  echogenicity, consistent with medical renal disease. No mass or hydronephrosis visualized. Left Kidney: Length: 10.0 cm. Mildly increased parenchymal echogenicity, consistent with medical renal disease. No mass or hydronephrosis visualized. Bladder: Appears normal for degree of bladder distention. IMPRESSION: Mildly increased renal parenchymal echogenicity, consistent with medical renal disease. No evidence of hydronephrosis. Electronically Signed   By: Earle Gell M.D.   On: 03/07/2015 16:19   Dg Chest Portable 1 View  03/29/2015  CLINICAL DATA:  Short of breath EXAM: PORTABLE CHEST 1 VIEW COMPARISON:  03/25/2015 FINDINGS: Partial clearing of bilateral airspace disease with small areas of residual infiltrate bilaterally. No effusion. Heart size mildly enlarged. IMPRESSION: Partial clearing and bilateral airspace disease. Possible pneumonia versus edema. Electronically Signed   By: Franchot Gallo M.D.  On: 03/29/2015 12:04   Dg Chest Port 1 View  03/25/2015  CLINICAL DATA:  65 year old female with shortness of breath and weakness today. EXAM: PORTABLE CHEST 1 VIEW COMPARISON:  Chest x-ray 03/08/2015. FINDINGS: There is cephalization of the pulmonary vasculature and slight indistinctness of the interstitial markings suggestive of mild pulmonary edema. No definite pleural effusions. Heart size is mildly enlarged. The patient is rotated to the left on today's exam, resulting in distortion of the mediastinal contours and reduced diagnostic sensitivity and specificity for mediastinal pathology. Atherosclerosis in the thoracic aorta. IMPRESSION: 1. The appearance the chest suggests congestive heart failure, as above. Electronically Signed   By: Vinnie Langton M.D.   On: 03/25/2015 17:50   Dg Chest Port 1 View  03/08/2015  CLINICAL DATA:  COPD, congestive heart failure.  Short of breath. EXAM: PORTABLE CHEST 1 VIEW COMPARISON:  03/04/2015 FINDINGS: Normal cardiac silhouette. Mild bronchitic markings centrally  similar prior. Some linear nodular opacity at the RIGHT lung base. No pneumothorax. IMPRESSION: Mild linear nodular pattern RIGHT lung base could represent early pneumonia. Overall similar exam with central chronic bronchitic markings. Electronically Signed   By: Suzy Bouchard M.D.   On: 03/08/2015 12:56     EKG: (Independently reviewed)  Sinus rhythm with ventricular rate 81 bpm, QTC 476 ms, T-wave inversion in lead 3 with subtle downsloping T waves V4-V6 that this is unchanged from previous EKG 03/24/2015   Assessment & Plan  Principal Problem:   HCAP/ Acute respiratory failure  -Patient developed productive cough with low-grade fevers and progressive shortness of breath in the past 2-3 days and was recently hospitalized so suspect HCAP -Admit to telemetry/Inpt -Not hypoxemic at rest -Has definitive opacities that appear almost nodular on plain films-if these opacities do not clear after adequate treatment with antibiotics recommend CT of the chest with contrast to better clarify in patient with long term smoking history (stopped 2 months ago) -Empiric Maxipime and vancomycin -Blood cultures, urinary strep and legionella, influenza PCR -Mucinex and Tessalon perles  Active Problems:   Renal artery stenosis/CKD, stage IV/ Metabolic acidosis -Previously listed as CKD 3 but most recent GFR is now in the stage IV range -Noted metabolic acidemia last admission that responded to oral bicarbonate; unclear if patient was taking prescribed bicarbonate at home but presents with persistent elevated anion gap as well as elevated serum CO2 so will continue bicarbonate twice a day -Patient states outpatient nephrologist is Dr. Florene Glen -Lactic acid is 1.3 -Phosphorus 3.7    Recent Upper GI bleed presumed from jejunum, AVM vs. Dieaulafoy's lesion -Having persistent dark stools but this may be from iron supplementation -Patient denies bright red blood per rectum -Hemoglobin stable      Hypocalcemia/Low serum vitamin D - appears to be expressing persistent hypocalcemia in setting of recent administration of IV Pamidronate and inconsistent calcium dosing after discharge -Vitamin D level on 1/13 was less than 5 and repeat vitamin D level on 1/27 after supplementation was 7.2 -We'll give one-time dose vitamin D by mouth 50,000 units and then begin 1000 units daily and recommend repeating vitamin D level with next 4-6 weeks -Begin oral calcium 1250 mg by mouth daily and adjust based on calcium levels -Since corrected calcium still low have opted to give one-time dose of IV calcium gluconate today -No tremors or [place] noticed on exam    Hypomagnesemia -Likely from GI losses in setting of persistent loose stools -Is not on PPI      Anemia of CKD/ Iron  deficiency anemia -Last full anemia panel is obtained in 2014 and at that time iron was 26 -On 03/04/15 folate was 421 and RBC folate was 1457 -Repeat anemia panel today -Continue preadmission iron sulfate -Hemoglobin less than 10 and patient now with stage IV chronic kidney disease so have asked pharmacy to dose Aranesp -Continue Lasix-currently on low-dose of begin's to develop edema or respiratory symptoms may need higher dosage in setting of chronic kidney disease    Chronic obstructive pulmonary disease/On home oxygen therapy -Based on history I obtained from patient she typically only utilizes oxygen at night, when short of breath and with activity -Currently at rest not hypoxemic -I have ordered oxygen prn with notation for RN to utilize with activity and at hour of sleep -Continue preadmission MDIs including Dulera    Chronic diastolic CHF/NYHA 2/moderate mitral regurgitation -Currently appears compensated -Last echocardiogram was in December 2016 at which time patient was noted with mild concentric hypertrophy with normal LVEF with associated moderate pulmonary hypertension of 40 mmHg and mild tricuspid  regurgitation-currently aortic valve unaffected by diastolic dysfunction    Essential hypertension -Blood pressure controlled -Continue preadmission Norvasc, carvedilol and Imdur    Former tobacco use -Quit 2 months ago -See problem 1 regarding possible need for CT of the chest    Hyperlipidemia -Continue Lipitor and omega-3 fatty acids    Peripheral vascular disease  -No reports of claudication    Diet-controlled diabetes mellitus  -Currently not on medications -Hemoglobin A1c on 03/04/15 was 6.2 and given patient's chronic kidney disease and advanced physiologic age can allow hemoglobin A1c in the range of 6.5-7.0 -Follow CBGs and provide SSI if indicated    CAD  -Currently asymptomatic -Continue preadmission aspirin ,Plavix, carvedilol, statin and Imdur    Protein-calorie malnutrition  -Continue preadmission ensure supplementation    Lewy body dementia without behavioral disturbance -Monitor for acute delirium in the hospital setting    Sacral ulcer -WOC RN evaluation during admission    DVT Prophylaxis: SCDs initially since recently admitted for GI bleeding symptoms-consider transitioning to pharmacological therapies on 2/6  Family Communication:   Husband at bedside  Code Status: Full code   Condition:  Stable  Discharge disposition: Anticipate discharge back to home environment once medically stable  Time spent in minutes : 60      Marlette Curvin L. ANP on 03/29/2015 at 1:56 PM  You may contact me by going to [email or web address] - password [email or web address]  I am available from 7a-7p but please confirm I am on the schedule by going to Amion as above.   After 7p please contact night coverage person covering me after hours  [facility]

## 2015-03-29 NOTE — Progress Notes (Signed)
Called ED for report  

## 2015-03-30 ENCOUNTER — Inpatient Hospital Stay (HOSPITAL_COMMUNITY): Payer: Self-pay

## 2015-03-30 ENCOUNTER — Telehealth: Payer: Self-pay

## 2015-03-30 DIAGNOSIS — E440 Moderate protein-calorie malnutrition: Secondary | ICD-10-CM | POA: Insufficient documentation

## 2015-03-30 LAB — COMPREHENSIVE METABOLIC PANEL
ALT: 11 U/L — AB (ref 14–54)
AST: 17 U/L (ref 15–41)
Albumin: 1.8 g/dL — ABNORMAL LOW (ref 3.5–5.0)
Alkaline Phosphatase: 93 U/L (ref 38–126)
Anion gap: 13 (ref 5–15)
BUN: 53 mg/dL — ABNORMAL HIGH (ref 6–20)
CHLORIDE: 107 mmol/L (ref 101–111)
CO2: 19 mmol/L — AB (ref 22–32)
CREATININE: 3.53 mg/dL — AB (ref 0.44–1.00)
Calcium: 5.3 mg/dL — CL (ref 8.9–10.3)
GFR, EST AFRICAN AMERICAN: 15 mL/min — AB (ref >60)
GFR, EST NON AFRICAN AMERICAN: 13 mL/min — AB (ref >60)
Glucose, Bld: 94 mg/dL (ref 65–99)
POTASSIUM: 3.3 mmol/L — AB (ref 3.5–5.1)
SODIUM: 139 mmol/L (ref 135–145)
Total Bilirubin: 0.6 mg/dL (ref 0.3–1.2)
Total Protein: 5.8 g/dL — ABNORMAL LOW (ref 6.5–8.1)

## 2015-03-30 LAB — CBC
HEMATOCRIT: 26.7 % — AB (ref 36.0–46.0)
Hemoglobin: 9 g/dL — ABNORMAL LOW (ref 12.0–15.0)
MCH: 29.7 pg (ref 26.0–34.0)
MCHC: 33.7 g/dL (ref 30.0–36.0)
MCV: 88.1 fL (ref 78.0–100.0)
PLATELETS: 208 10*3/uL (ref 150–400)
RBC: 3.03 MIL/uL — AB (ref 3.87–5.11)
RDW: 15.5 % (ref 11.5–15.5)
WBC: 10.2 10*3/uL (ref 4.0–10.5)

## 2015-03-30 LAB — PHOSPHORUS: PHOSPHORUS: 3.3 mg/dL (ref 2.5–4.6)

## 2015-03-30 LAB — MAGNESIUM: MAGNESIUM: 1.8 mg/dL (ref 1.7–2.4)

## 2015-03-30 LAB — PROCALCITONIN: Procalcitonin: 1.7 ng/mL

## 2015-03-30 MED ORDER — NA FERRIC GLUC CPLX IN SUCROSE 12.5 MG/ML IV SOLN
125.0000 mg | Freq: Once | INTRAVENOUS | Status: AC
  Administered 2015-03-30: 125 mg via INTRAVENOUS
  Filled 2015-03-30 (×2): qty 10

## 2015-03-30 MED ORDER — POTASSIUM CHLORIDE CRYS ER 20 MEQ PO TBCR
20.0000 meq | EXTENDED_RELEASE_TABLET | Freq: Once | ORAL | Status: AC
  Administered 2015-03-30: 20 meq via ORAL
  Filled 2015-03-30: qty 1

## 2015-03-30 MED ORDER — GI COCKTAIL ~~LOC~~
30.0000 mL | Freq: Once | ORAL | Status: AC
Start: 2015-03-30 — End: 2015-03-30
  Administered 2015-03-30: 30 mL via ORAL
  Filled 2015-03-30: qty 30

## 2015-03-30 MED ORDER — SODIUM CHLORIDE 0.9 % IV SOLN
1.0000 g | Freq: Once | INTRAVENOUS | Status: AC
  Administered 2015-03-30: 1 g via INTRAVENOUS
  Filled 2015-03-30: qty 10

## 2015-03-30 MED ORDER — ENSURE ENLIVE PO LIQD
237.0000 mL | Freq: Two times a day (BID) | ORAL | Status: DC
  Administered 2015-03-31 – 2015-04-03 (×3): 237 mL via ORAL

## 2015-03-30 NOTE — Progress Notes (Signed)
PROGRESS NOTE  [name] [id number] DOB: 1950/03/31 DOA: 03/29/2015 PCP: Pcp Not In System Brief History 65 year old female with multiple medical problems including but not limited to CKD stage IV, COPD on home oxygen when necessary, chronic diastolic CHF, hypertension, recent GI bleed, CAD, hyperlipidemia, diet-controlled diabetes mellitus presented with 2-3 day history of cough, dyspnea on exertion, and subjective fevers. Notably, the patient was recently discharged from the hospital on 03/26/2015 after treatment for upper GI bleed. She underwent endoscopy (Dr. Wilfrid Lund III) which did not show evidence of active bleeding but there was a suspected distal intestinal ulcer or AVM. She was also seen by cardiology secondary to chest discomfort and elevated troponins. The patient had a Myoview on 01/22/2015 with intermediate risk, EF 52% They did not feel that she was a candidate for open heart surgery or cardiac catheterization or percutaneous intervention given her bleeding risk and renal failure. She was to be managed medically from a cardiac standpoint. Cardiology felt that she could be continued with clopidogrel monotherapy after discharge. At the time of this admission, the patient was noted to be hypocalcemic with a corrected calcium of 6.8. The patient was recently treated for hypercalcemia with intravenous pamidronate which was thought to be due to dehydration and calcium supplementation. Assessment/Plan:  HCAP/ Acute on chronic respiratory failure with hypoxia -Patient developed productive cough with low-grade fevers and progressive shortness of breath in the past 2-3 days  -Empiric Maxipime and vancomycin -Blood cultures,  -influenza PCR--neg -Mucinex and Tessalon perles -WBC improving -CT chest without contrast   Renal artery stenosis/CKD, stage IV/ Metabolic acidosis -Previously listed as CKD 3 but most recent GFR is now in the stage IV range -Noted metabolic  acidemia last admission that responded to oral bicarbonate; -unclear if patient was taking prescribed bicarbonate at home   -continue bicarbonate twice a day -Patient states outpatient nephrologist is Dr. Florene Glen -Lactic acid is 1.3 -Baseline creatinine 3.0-3.3   Recent Upper GI bleed  -Having persistent dark stools but this may be from iron supplementation -Patient denies bright red blood per rectum -Hemoglobin stable  -Iron saturation 5%, ferritin 360   Hypocalcemia/Low serum vitamin D - appears to be expressing persistent hypocalcemia in setting of recent administration of IV Pamidronate and inconsistent calcium dosing after discharge -Vitamin D level on 1/13 was less than 5 and repeat vitamin D level on 1/27 after supplementation was 7.2 -We'll give one-time dose vitamin D by mouth 50,000 units and then begin 1000 units daily and recommend repeating vitamin D level with next 4-6 weeks -Begin oral calcium 1250 mg by mouth daily  -Since corrected calcium still low-- give additional dose of IV calcium gluconate today -No tremors or [place] noticed on exam   Hypomagnesemia -Likely from GI losses in setting of persistent loose stools -Is not on PPI -repleted    Anemia of CKD/ Iron deficiency anemia -On 03/04/15 RBC folate was 1457 -03/29/2015 iron saturation 5%, B12 1290 -Continue preadmission iron sulfate -Hemoglobin less than 10 and patient now with stage IV chronic kidney disease so have asked pharmacy to dose Aranesp--given 03/29/15 -give nulecit x 1   Chronic obstructive pulmonary disease/On home oxygen therapy -Based on history, pt typically only utilizes oxygen at night, when short of breath and with activity -oxygen prn with notation for RN to utilize with activity and at hour of sleep -Continue preadmission MDIs including Dulera   Chronic diastolic CHF/NYHA 2/moderate mitral regurgitation -Currently compensated -01/24/2015  echo--60-65 percent, grade 2 DD, PAP 40,  moderate MR   Essential hypertension -Blood pressure controlled -Continue preadmission Norvasc, carvedilol and Imdur -ARB d/c'ed last admit due to CKD   Former tobacco use -Quit 2 months ago -See problem 1 regarding possible need for CT of the chest   Hyperlipidemia -Continue Lipitor and omega-3 fatty acids    Diet-controlled diabetes mellitus  -diet controlled -Hemoglobin A1c on 03/04/15 was 6.2  -Follow CBGs and provide SSI if indicated   CAD  -Currently asymptomatic -Continue preadmission aspirin ,Plavix, carvedilol, statin and Imdur -according to Dr. Hassell Done note on 03/25/15--pt can take plavix monotherapy to decrease risk of GIB   Protein-calorie malnutrition  -Continue preadmission ensure supplementation   Lewy body dementia without behavioral disturbance -Monitor for acute delirium in the hospital setting   Sacral ulcer -WOC RN evaluation during admission     Family Communication:   Pt at beside Disposition Plan:   Home 2-3 days       Procedures/Studies: Dg Chest 2 View  03/04/2015  CLINICAL DATA:  Altered mental status today. Bilateral upper extremity weakness. EXAM: CHEST  2 VIEW COMPARISON:  PA and lateral chest 01/21/2015 and 09/07/2012. CT chest 12/14/2011. FINDINGS: There is cardiomegaly and mild vascular congestion. No consolidative process, pneumothorax or effusion is identified. IMPRESSION: No acute disease. Cardiomegaly and vascular congestion. Electronically Signed   By: Inge Rise M.D.   On: 03/04/2015 10:25   Ct Head Wo Contrast  03/04/2015  CLINICAL DATA:  Altered mental status, weakness, difficulty with ambulation EXAM: CT HEAD WITHOUT CONTRAST TECHNIQUE: Contiguous axial images were obtained from the base of the skull through the vertex without intravenous contrast. COMPARISON:  08/10/2004 FINDINGS: There is no evidence of mass effect, midline shift, or extra-axial fluid collections. There is no evidence of a space-occupying  lesion or intracranial hemorrhage. There is no evidence of a cortical-based area of acute infarction. There is generalized cerebral atrophy. There is periventricular white matter low attenuation likely secondary to microangiopathy. The ventricles and sulci are appropriate for the patient's age. The basal cisterns are patent. Visualized portions of the orbits are unremarkable. The visualized portions of the paranasal sinuses and mastoid air cells are unremarkable. Cerebrovascular atherosclerotic calcifications are noted. The osseous structures are unremarkable. IMPRESSION: 1. No acute intracranial pathology. 2. Chronic microvascular disease and cerebral atrophy. Electronically Signed   By: Kathreen Devoid   On: 03/04/2015 10:36   US Renal  03/07/2015  CLINICAL DATA:  Acute kidney failure.  Diabetes and hypertension. EXAM: RENAL / URINARY TRACT ULTRASOUND COMPLETE COMPARISON:  09/08/2012 FINDINGS: Right Kidney: Length: 11.1 cm. Mildly increased parenchymal echogenicity, consistent with medical renal disease. No mass or hydronephrosis visualized. Left Kidney: Length: 10.0 cm. Mildly increased parenchymal echogenicity, consistent with medical renal disease. No mass or hydronephrosis visualized. Bladder: Appears normal for degree of bladder distention. IMPRESSION: Mildly increased renal parenchymal echogenicity, consistent with medical renal disease. No evidence of hydronephrosis. Electronically Signed   By: Earle Gell M.D.   On: 03/07/2015 16:19   Dg Chest Portable 1 View  03/29/2015  CLINICAL DATA:  Short of breath EXAM: PORTABLE CHEST 1 VIEW COMPARISON:  03/25/2015 FINDINGS: Partial clearing of bilateral airspace disease with small areas of residual infiltrate bilaterally. No effusion. Heart size mildly enlarged. IMPRESSION: Partial clearing and bilateral airspace disease. Possible pneumonia versus edema. Electronically Signed   By: Franchot Gallo M.D.   On: 03/29/2015 12:04   Dg Chest Port 1 View  03/25/2015   CLINICAL DATA:  65 year old  female with shortness of breath and weakness today. EXAM: PORTABLE CHEST 1 VIEW COMPARISON:  Chest x-ray 03/08/2015. FINDINGS: There is cephalization of the pulmonary vasculature and slight indistinctness of the interstitial markings suggestive of mild pulmonary edema. No definite pleural effusions. Heart size is mildly enlarged. The patient is rotated to the left on today's exam, resulting in distortion of the mediastinal contours and reduced diagnostic sensitivity and specificity for mediastinal pathology. Atherosclerosis in the thoracic aorta. IMPRESSION: 1. The appearance the chest suggests congestive heart failure, as above. Electronically Signed   By: Vinnie Langton M.D.   On: 03/25/2015 17:50   Dg Chest Port 1 View  03/08/2015  CLINICAL DATA:  COPD, congestive heart failure.  Short of breath. EXAM: PORTABLE CHEST 1 VIEW COMPARISON:  03/04/2015 FINDINGS: Normal cardiac silhouette. Mild bronchitic markings centrally similar prior. Some linear nodular opacity at the RIGHT lung base. No pneumothorax. IMPRESSION: Mild linear nodular pattern RIGHT lung base could represent early pneumonia. Overall similar exam with central chronic bronchitic markings. Electronically Signed   By: Suzy Bouchard M.D.   On: 03/08/2015 12:56         Subjective: Patient states that shortness of breath is not significantly improved from last few days but not worse. Denies any fevers, chills, chest pain, nausea, vomiting, diarrhea, abdominal pain. No hematochezia. No dysuria or hematuria.  Objective: Filed Vitals:   03/29/15 1513 03/29/15 2134 03/30/15 0435 03/30/15 0500  BP: 121/55 129/44 130/56   Pulse: 83 84 81   Temp: 98.4 F (36.9 C) 98.5 F (36.9 C) 98.6 F (37 C)   TempSrc: Oral Oral Oral   Resp: 13 15 16    Height:      Weight:  63.2 kg (139 lb 5.3 oz)  63.2 kg (139 lb 5.3 oz)  SpO2: 94% 90% 93%     Intake/Output Summary (Last 24 hours) at 03/30/15 0845 Last data filed  at 03/30/15 0600  Gross per 24 hour  Intake    150 ml  Output    201 ml  Net    -51 ml   Weight change:  Exam:   General:  Pt is alert, follows commands appropriately, not in acute distress  HEENT: No icterus, No thrush, No neck mass, [place]/AT  Cardiovascular: RRR, S1/S2, no rubs, no gallops  Respiratory: CTA bilaterally, no wheezing, no crackles, no rhonchi  Abdomen: Soft/+BS, non tender, non distended, no guarding  Extremities: No edema, No lymphangitis, No petechiae, No rashes, no synovitis  Data Reviewed: Basic Metabolic Panel:  Recent Labs Lab 03/24/15 0530 03/25/15 0530 03/26/15 0453 03/29/15 1041 03/29/15 1043 03/29/15 1259 03/30/15 0604  NA 143 140 138  --  138  --  139  K 4.5 4.1 3.8  --  3.6  --  3.3*  CL 115* 111 109  --  103  --  107  CO2 19* 18* 17*  --  18*  --  19*  GLUCOSE 85 104* 130*  --  153*  --  94  BUN 53* 46* 44*  --  55*  --  53*  CREATININE 3.30* 3.20* 2.99*  --  3.71*  --  3.53*  CALCIUM 6.0* 5.8* 5.4*  --  5.4*  --  5.3*  MG  --   --  1.2* 1.2*  --   --  1.8  PHOS  --   --   --   --   --  3.7 3.3   Liver Function Tests:  Recent Labs Lab 03/26/15 0453  03/30/15 0604  AST 21 17  ALT 58* 11*  ALKPHOS 76 93  BILITOT 0.6 0.6  PROT 6.1* 5.8*  ALBUMIN 2.2* 1.8*   No results for input(s): LIPASE, AMYLASE in the last 168 hours. No results for input(s): AMMONIA in the last 168 hours. CBC:  Recent Labs Lab 03/24/15 0530 03/25/15 0530 03/26/15 0453 03/29/15 1043 03/30/15 0604  WBC 6.6 8.2 6.7 13.5* 10.2  NEUTROABS  --   --  5.6  --   --   HGB 7.4* 8.9* 9.5* 9.5* 9.0*  HCT 22.6* 27.5* 28.6* 28.4* 26.7*  MCV 89.3 92.3 91.1 87.9 88.1  PLT 212 189 166 222 208   Cardiac Enzymes:  Recent Labs Lab 03/24/15 1427 03/24/15 2320 03/25/15 0530  TROPONINI 0.05* 0.04* 0.05*   BNP: Invalid input(s): POCBNP CBG:  Recent Labs Lab 03/25/15 1204 03/25/15 1710 03/26/15 0026 03/26/15 0456 03/26/15 1144  GLUCAP 83 142* 76 115* 152*      No results found for this or any previous visit (from the past 240 hour(s)).   Scheduled Meds: . amLODipine  5 mg Oral Daily  . aspirin EC  81 mg Oral Daily  . atorvastatin  80 mg Oral q1800  . calcium carbonate  1 tablet Oral Q breakfast  . calcium gluconate  1 g Intravenous Once  . carvedilol  3.125 mg Oral BID WC  . ceFEPime (MAXIPIME) IV  1 g Intravenous Q24H  . cholecalciferol  1,000 Units Oral Daily  . clopidogrel  75 mg Oral Daily  . collagenase   Topical Daily  . darbepoetin (ARANESP) injection - NON-DIALYSIS  40 mcg Subcutaneous Q Sun-1800  . famotidine  20 mg Oral Daily  . feeding supplement (ENSURE ENLIVE)  237 mL Oral Q24H  . ferrous sulfate  325 mg Oral TID WC  . furosemide  20 mg Oral Daily  . guaiFENesin  600 mg Oral BID  . isosorbide mononitrate  30 mg Oral Daily  . mometasone-formoterol  1 puff Inhalation Daily  . omega-3 acid ethyl esters  1 g Oral TID  . sertraline  50 mg Oral Daily  . sodium bicarbonate  650 mg Oral BID  . sodium chloride flush  3 mL Intravenous Q12H  . tamsulosin  0.4 mg Oral Daily  . [START ON 03/31/2015] vancomycin  1,000 mg Intravenous Q48H   Continuous Infusions:    Kassidi Elza, DO  [facility] Pager [phone]  If 7PM-7AM, please contact night-coverage [email or web address] Password [email or web address] 03/30/2015, 8:45 AM   LOS: 1 day

## 2015-03-30 NOTE — Progress Notes (Signed)
CRITICAL VALUE ALERT  Critical value received:  Ca 5.3   Date of notification:  03/30/2015  Time of notification:  7:51 AM  Critical value read back:Yes.    Nurse who received alert:  Retta Mac  MD notified (1st page):  Dr. Carles Collet  Time of first page:  0750  MD notified (2nd page):  Time of second page:  Responding MD:  [phone]  Time MD responded:  Dr. Carles Collet

## 2015-03-30 NOTE — Telephone Encounter (Signed)
Patient currently admitted.  This RN was out of office sick when patient originally called.

## 2015-03-30 NOTE — Progress Notes (Signed)
Initial Nutrition Assessment  DOCUMENTATION CODES:   Non-severe (moderate) malnutrition in context of chronic illness  INTERVENTION:  Provide Ensure Enlive po BID, each supplement provides 350 kcal and 20 grams of protein.  Encourage adequate PO intake.   NUTRITION DIAGNOSIS:   Increased nutrient needs related to wound healing as evidenced by estimated needs.  GOAL:   Patient will meet greater than or equal to 90% of their needs  MONITOR:   PO intake, Supplement acceptance, Weight trends, Labs, I & O's, Skin  REASON FOR ASSESSMENT:   Malnutrition Screening Tool    ASSESSMENT:   65 year old female with multiple medical problems including but not limited to CKD stage IV, COPD on home oxygen when necessary, chronic diastolic CHF, hypertension, recent GI bleed, CAD, hyperlipidemia, diet-controlled diabetes mellitus presented with 2-3 day history of cough, dyspnea on exertion, and subjective fevers.  Pt reports appetite is fair. Meal completion has been 20%. She reports PTA eating well with at least 3 meals a day with an Ensure once daily with no other difficulties. Per Epic weight records, pt with no weight loss. Pt currently has Ensure ordered and has been consuming them. RD to modify orders and increase Ensure to aid in caloric and protein needs. Pt was encouraged to eat her food at meals.   Nutrition-Focused physical exam completed. Findings are moderate fat depletion, moderate muscle depletion, and mild edema.   Labs and medications reviewed.   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  Estimated  Nutritional Needs:   Kcal:  1600-1850  Protein:  75-85 grams  Fluid:  1.6 - 1.8 L/day  EDUCATION NEEDS:   No education needs identified at this time  Corrin Parker, MS, RD, LDN Pager # [phone] After hours/ weekend pager # [phone]

## 2015-03-30 NOTE — Telephone Encounter (Signed)
Call placed to [facility], RN CM to inform her that the patient has been followed at the [facility] at the [facility]  Her appointment for tomorrow has been cancelled and will be rescheduled when a discharge date is determined.  Will follow her hospital course.

## 2015-03-31 ENCOUNTER — Inpatient Hospital Stay: Payer: Self-pay | Admitting: Family Medicine

## 2015-03-31 LAB — CBC
HEMATOCRIT: 26.1 % — AB (ref 36.0–46.0)
HEMOGLOBIN: 8.7 g/dL — AB (ref 12.0–15.0)
MCH: 29.5 pg (ref 26.0–34.0)
MCHC: 33.3 g/dL (ref 30.0–36.0)
MCV: 88.5 fL (ref 78.0–100.0)
Platelets: 254 10*3/uL (ref 150–400)
RBC: 2.95 MIL/uL — ABNORMAL LOW (ref 3.87–5.11)
RDW: 15.6 % — ABNORMAL HIGH (ref 11.5–15.5)
WBC: 8.9 10*3/uL (ref 4.0–10.5)

## 2015-03-31 LAB — BASIC METABOLIC PANEL
Anion gap: 16 — ABNORMAL HIGH (ref 5–15)
BUN: 49 mg/dL — AB (ref 6–20)
CO2: 19 mmol/L — AB (ref 22–32)
Calcium: 5.7 mg/dL — CL (ref 8.9–10.3)
Chloride: 107 mmol/L (ref 101–111)
Creatinine, Ser: 3.36 mg/dL — ABNORMAL HIGH (ref 0.44–1.00)
GFR calc Af Amer: 16 mL/min — ABNORMAL LOW (ref >60)
GFR, EST NON AFRICAN AMERICAN: 13 mL/min — AB (ref >60)
GLUCOSE: 84 mg/dL (ref 65–99)
POTASSIUM: 3.9 mmol/L (ref 3.5–5.1)
Sodium: 142 mmol/L (ref 135–145)

## 2015-03-31 LAB — MAGNESIUM: Magnesium: 1.7 mg/dL (ref 1.7–2.4)

## 2015-03-31 LAB — STREP PNEUMONIAE URINARY ANTIGEN: Strep Pneumo Urinary Antigen: NEGATIVE

## 2015-03-31 MED ORDER — GI COCKTAIL ~~LOC~~
30.0000 mL | Freq: Two times a day (BID) | ORAL | Status: DC | PRN
  Administered 2015-03-31 – 2015-04-02 (×3): 30 mL via ORAL
  Filled 2015-03-31 (×4): qty 30

## 2015-03-31 MED ORDER — SODIUM CHLORIDE 0.9 % IV SOLN
1.0000 g | Freq: Once | INTRAVENOUS | Status: AC
  Administered 2015-03-31: 1 g via INTRAVENOUS
  Filled 2015-03-31: qty 10

## 2015-03-31 MED ORDER — CALCIUM CARBONATE 1250 (500 CA) MG PO TABS: 1250.0000 mg | ORAL_TABLET | Freq: Every day | ORAL | Status: DC

## 2015-03-31 MED ORDER — CALCIUM CARBONATE 1250 (500 CA) MG PO TABS
1250.0000 mg | ORAL_TABLET | Freq: Two times a day (BID) | ORAL | Status: DC
  Administered 2015-03-31 – 2015-04-01 (×3): 1250 mg via ORAL
  Filled 2015-03-31 (×3): qty 1

## 2015-03-31 NOTE — Plan of Care (Signed)
Problem: Skin Integrity: Goal: Risk for impaired skin integrity will decrease Outcome: Not Progressing Patient has a stage 3 pressure wound on coccyx.

## 2015-03-31 NOTE — Progress Notes (Signed)
  Will see in the morning as formal consult. Patient has 1 of 4 bottles + staph aureus in setting of admitted for HCAP. Agree with empiric vancomycin and cefepime. Will order repeat blood cx to see if further bacteremia      [place] [facility] Staphylococcus aureus bacteremia   Staphylococcus aureus bacteremia (SAB) is associated with a high rate of complications and mortality.  Specific aspects of clinical management are critical to optimizing the outcome of patients with SAB.  Therefore, the [facility] Antimicrobial Management Team [facility]) has initiated an intervention aimed at improving the management of SAB at [facility].  To do so, Infectious Diseases physicians are providing an evidence-based consult for the management of all patients with SAB.     Yes No Comments  Perform follow-up blood cultures (even if the patient is afebrile) to ensure clearance of bacteremia [x]  []   2/7  Remove vascular catheter and obtain follow-up blood cultures after the removal of the catheter []  []    Perform echocardiography to evaluate for endocarditis (transthoracic ECHO is 40-50% sensitive, TEE is > 90% sensitive) []  []  Please keep in mind, that neither test can definitively EXCLUDE endocarditis, and that should clinical suspicion remain high for endocarditis the patient should then still be treated with an "endocarditis" duration of therapy = 6 weeks  Consult electrophysiologist to evaluate implanted cardiac device (pacemaker, ICD) []  []    Ensure source control []  []  Have all abscesses been drained effectively? Have deep seeded infections (septic joints or osteomyelitis) had appropriate surgical debridement?  Investigate for "metastatic" sites of infection []  []  Does the patient have ANY symptom or physical exam finding that would suggest a deeper infection (back or neck pain that may be suggestive of vertebral osteomyelitis or epidural abscess, muscle pain that could be a symptom of  pyomyositis)?  Keep in mind that for deep seeded infections MRI imaging with contrast is preferred rather than other often insensitive tests such as plain x-rays, especially early in a patient's presentation.  Change antibiotic therapy to __________________ []  []  Beta-lactam antibiotics are preferred for MSSA due to higher cure rates.   If on Vancomycin, goal trough should be 15 - 20 mcg/mL  Estimated duration of IV antibiotic therapy:   []  []  Consult case management for probably prolonged outpatient IV antibiotic therapy

## 2015-03-31 NOTE — Progress Notes (Signed)
PROGRESS NOTE  [name] [id number] DOB: [birth year] DOA: 03/29/2015 PCP: Pcp Not In System  Brief History 65 year old female with multiple medical problems including but not limited to CKD stage IV, COPD on home oxygen when necessary, chronic diastolic CHF, hypertension, recent GI bleed, CAD, hyperlipidemia, diet-controlled diabetes mellitus presented with 2-3 day history of cough, dyspnea on exertion, and subjective fevers. Notably, the patient was recently discharged from the hospital on 03/26/2015 after treatment for upper GI bleed. She underwent endoscopy (Dr. Wilfrid Lund III) which did not show evidence of active bleeding but there was a suspected distal intestinal ulcer or AVM. She was also seen by cardiology secondary to chest discomfort and elevated troponins. The patient had a Myoview on 01/22/2015 with intermediate risk, EF 52% They did not feel that she was a candidate for open heart surgery or cardiac catheterization or percutaneous intervention given her bleeding risk and renal failure. She was to be managed medically from a cardiac standpoint. Cardiology felt that she could be continued with clopidogrel monotherapy after discharge. At the time of this admission, the patient was noted to be hypocalcemic with a corrected calcium of 6.8. The patient was recently treated for hypercalcemia with intravenous pamidronate which was thought to be due to dehydration and calcium supplementation. Assessment/Plan: HCAP/ Acute on chronic respiratory failure with hypoxia -Patient developed productive cough with low-grade fevers and progressive shortness of breath in the past 2-3 days  -Empiric Maxipime and vancomycin -Blood cultures--GPC one of two sets -influenza PCR--neg -Mucinex and Tessalon perles -WBC improving -CT chest without contrast--bilateral nodular mass like opacities R>L with mediastinal LN -will need outpt surveillance CT after treated pneumonia--if not  improved-->will need further workup -continue vanc and cefepime, but plan to d/c with levofloxacin 750mg  po q 48 hours if blood cultures suggest contaminant Bacteremia -GPC one of two sets -continue vancomycin for now pending identification Renal artery stenosis/Acute on chronic renal failure CKD, stage IV/ Metabolic acidosis -Previously listed as CKD 3 but most recent GFR is now in the stage IV range -Noted metabolic acidemia last admission that responded to oral bicarbonate; -unclear if patient was taking prescribed bicarbonate at home  -continue bicarbonate twice a day - renal function back to baseline -Patient states outpatient nephrologist is Dr. Florene Glen -Lactic acid is 1.3 -Baseline creatinine 3.0-3.3   Recent Upper GI bleed  -Having persistent dark stools but this may be from iron supplementation -Patient denies bright red blood per rectum -Hemoglobin stable  -Iron saturation 5%, ferritin 360   Hypocalcemia/Low serum vitamin D - appears to be expressing persistent hypocalcemia in setting of recent administration of IV Pamidronate and inconsistent calcium dosing after discharge -Vitamin D level on 1/13 was less than 5 and repeat vitamin D level on 1/27 after supplementation was 7.2 -given one-time dose vitamin D by mouth 50,000 units and then begin 1000 units daily and recommend repeating vitamin D level with next 4-6 weeks -Begin oral calcium 1250 mg by mouth bid -Since corrected calcium still low-- give additional dose of IV calcium gluconate today -No tremors or [place] noticed on exam   Hypomagnesemia -Likely from GI losses in setting of persistent loose stools -Is not on PPI -repleted    Anemia of CKD/ Iron deficiency anemia -On 03/04/15 RBC folate was 1457 -03/29/2015 iron saturation 5%, B12 1290 -Continue preadmission iron sulfate -Hemoglobin less than 10 and patient now with stage IV chronic kidney disease so have asked pharmacy to dose Aranesp--given  03/29/15 -given nulecit x 1   Chronic obstructive pulmonary disease/On home oxygen therapy -Based on history, pt typically only utilizes oxygen at night, when short of breath and with activity -oxygen prn with notation for RN to utilize with activity and at hour of sleep -Continue preadmission MDIs including Dulera -stable on RA presently   Chronic diastolic CHF/NYHA 2/moderate mitral regurgitation -Currently compensated -01/24/2015 echo--60-65 percent, grade 2 DD, PAP 40, moderate MR   Essential hypertension -Blood pressure controlled -Continue preadmission Norvasc, carvedilol and Imdur -[facility]B d/c'ed last admit due to CKD   Former tobacco use -Quit 3 months ago -See problem 1 regarding possible need for CT of the chest   Hyperlipidemia -Continue Lipitor and omega-3 fatty acids    Diet-controlled diabetes mellitus  -diet controlled -Hemoglobin A1c on 03/04/15 was 6.2  -Follow CBGs and provide SSI if indicated   CAD  -Currently asymptomatic -Continue preadmission aspirin ,Plavix, carvedilol, statin and Imdur -according to Dr. Hassell Done note on 03/25/15--pt can take plavix monotherapy to decrease risk of GIB -restarted plavix and monitor Hgb   Protein-calorie malnutrition  -Continue preadmission ensure supplementation   Lewy body dementia without behavioral disturbance -Monitor for acute delirium in the hospital setting   Sacral ulcer -WOC RN evaluation during admission    Procedures/Studies: Dg Chest 2 View  03/04/2015  CLINICAL DATA:  Altered mental status today. Bilateral upper extremity weakness. EXAM: CHEST  2 VIEW COMPARISON:  PA and lateral chest 01/21/2015 and 09/07/2012. CT chest 12/14/2011. FINDINGS: There is cardiomegaly and mild vascular congestion. No consolidative process, pneumothorax or effusion is identified. IMPRESSION: No acute disease. Cardiomegaly and vascular congestion. Electronically Signed   By: Inge Rise M.D.   On: 03/04/2015 10:25    Ct Head Wo Contrast  03/04/2015  CLINICAL DATA:  Altered mental status, weakness, difficulty with ambulation EXAM: CT HEAD WITHOUT CONTRAST TECHNIQUE: Contiguous axial images were obtained from the base of the skull through the vertex without intravenous contrast. COMPARISON:  08/10/2004 FINDINGS: There is no evidence of mass effect, midline shift, or extra-axial fluid collections. There is no evidence of a space-occupying lesion or intracranial hemorrhage. There is no evidence of a cortical-based area of acute infarction. There is generalized cerebral atrophy. There is periventricular white matter low attenuation likely secondary to microangiopathy. The ventricles and sulci are appropriate for the patient's age. The basal cisterns are patent. Visualized portions of the orbits are unremarkable. The visualized portions of the paranasal sinuses and mastoid air cells are unremarkable. Cerebrovascular atherosclerotic calcifications are noted. The osseous structures are unremarkable. IMPRESSION: 1. No acute intracranial pathology. 2. Chronic microvascular disease and cerebral atrophy. Electronically Signed   By: Kathreen Devoid   On: 03/04/2015 10:36   Ct Chest Wo Contrast  03/30/2015  CLINICAL DATA:  65 year old female with history of pulmonary infiltrates on recent chest x-ray. EXAM: CT CHEST WITHOUT CONTRAST TECHNIQUE: Multidetector CT imaging of the chest was performed following the standard protocol without IV contrast. COMPARISON:  Chest CT 12/14/2010.  Chest x-ray 10/16/2015. FINDINGS: Mediastinum/Lymph Nodes: Heart size is borderline enlarged. There is no significant pericardial fluid, thickening or pericardial calcification. There is atherosclerosis of the thoracic aorta, the great vessels of the mediastinum and the coronary arteries, including calcified atherosclerotic plaque in the left main, left anterior descending, left circumflex and right coronary arteries. Multiple enlarged mediastinal lymph nodes  are noted measuring up to 16 mm in short axis in the right paratracheal nodal station. Prominent tissue in the hilar regions is also noted, which could  suggest additional lymphadenopathy, although this is poorly evaluated on today's noncontrast CT examination. Esophagus is unremarkable in appearance. No axillary lymphadenopathy. Lungs/Pleura: In the lungs bilaterally (right greater than left) there multiple nodular and mass-like areas of apparent airspace consolidation, largest of which is in the posterior aspect of the right lower lobe (image 38 of series 3) measuring 4.1 x 2.4 cm. The majority of these areas have some surrounding ground-glass attenuation. There is also some associated interlobular septal thickening in the lung bases bilaterally. No pleural effusions. Upper Abdomen: Status post cholecystectomy. Musculoskeletal/Soft Tissues: There are no aggressive appearing lytic or blastic lesions noted in the visualized portions of the skeleton. IMPRESSION: 1. Multiple nodular and mass-like areas of apparent airspace consolidation, asymmetrically distributed in the lungs bilaterally (right greater than left), which have an unusual appearance concerning for atypical infection. In particular, findings are concerning for potential fungal infection such as angioinvasive aspergillosis. 2. Associated with this there is mediastinal lymphadenopathy, as above. Although this could be reactive, the extensive lymphadenopathy is somewhat atypical for reactive lymphadenopathy, and accordingly, clinical correlation for signs and symptoms of lymphoproliferative disorder is recommended. 3. Atherosclerosis, including left main and 3 vessel coronary artery disease. Please note that although the presence of coronary artery calcium documents the presence of coronary artery disease, the severity of this disease and any potential stenosis cannot be assessed on this non-gated CT examination. Assessment for potential risk factor  modification, dietary therapy or pharmacologic therapy may be warranted, if clinically indicated. Electronically Signed   By: Vinnie Langton M.D.   On: 03/30/2015 15:28   US Renal  03/07/2015  CLINICAL DATA:  Acute kidney failure.  Diabetes and hypertension. EXAM: RENAL / URINARY TRACT ULTRASOUND COMPLETE COMPARISON:  09/08/2012 FINDINGS: Right Kidney: Length: 11.1 cm. Mildly increased parenchymal echogenicity, consistent with medical renal disease. No mass or hydronephrosis visualized. Left Kidney: Length: 10.0 cm. Mildly increased parenchymal echogenicity, consistent with medical renal disease. No mass or hydronephrosis visualized. Bladder: Appears normal for degree of bladder distention. IMPRESSION: Mildly increased renal parenchymal echogenicity, consistent with medical renal disease. No evidence of hydronephrosis. Electronically Signed   By: Earle Gell M.D.   On: 03/07/2015 16:19   Dg Chest Portable 1 View  03/29/2015  CLINICAL DATA:  Short of breath EXAM: PORTABLE CHEST 1 VIEW COMPARISON:  03/25/2015 FINDINGS: Partial clearing of bilateral airspace disease with small areas of residual infiltrate bilaterally. No effusion. Heart size mildly enlarged. IMPRESSION: Partial clearing and bilateral airspace disease. Possible pneumonia versus edema. Electronically Signed   By: Franchot Gallo M.D.   On: 03/29/2015 12:04   Dg Chest Port 1 View  03/25/2015  CLINICAL DATA:  65 year old female with shortness of breath and weakness today. EXAM: PORTABLE CHEST 1 VIEW COMPARISON:  Chest x-ray 03/08/2015. FINDINGS: There is cephalization of the pulmonary vasculature and slight indistinctness of the interstitial markings suggestive of mild pulmonary edema. No definite pleural effusions. Heart size is mildly enlarged. The patient is rotated to the left on today's exam, resulting in distortion of the mediastinal contours and reduced diagnostic sensitivity and specificity for mediastinal pathology. Atherosclerosis in the  thoracic aorta. IMPRESSION: 1. The appearance the chest suggests congestive heart failure, as above. Electronically Signed   By: Vinnie Langton M.D.   On: 03/25/2015 17:50   Dg Chest Port 1 View  03/08/2015  CLINICAL DATA:  COPD, congestive heart failure.  Short of breath. EXAM: PORTABLE CHEST 1 VIEW COMPARISON:  03/04/2015 FINDINGS: Normal cardiac silhouette. Mild bronchitic markings centrally similar prior.  Some linear nodular opacity at the RIGHT lung base. No pneumothorax. IMPRESSION: Mild linear nodular pattern RIGHT lung base could represent early pneumonia. Overall similar exam with central chronic bronchitic markings. Electronically Signed   By: Suzy Bouchard M.D.   On: 03/08/2015 12:56         Subjective: Patient denies fevers, chills, headache, chest pain, dyspnea, nausea, vomiting, diarrhea, abdominal pain, dysuria, hematuria   Objective: Filed Vitals:   03/30/15 2150 03/31/15 0411 03/31/15 0441 03/31/15 0842  BP: 122/42  136/55 133/57  Pulse: 70  71 76  Temp: 98.7 F (37.1 C)  98.5 F (36.9 C) 97.9 F (36.6 C)  TempSrc: Oral  Oral Oral  Resp: 18  16 18   Height:      Weight:  63.3 kg (139 lb 8.8 oz)    SpO2: 94%  94% 96%    Intake/Output Summary (Last 24 hours) at 03/31/15 1011 Last data filed at 03/31/15 0843  Gross per 24 hour  Intake    580 ml  Output      0 ml  Net    580 ml   Weight change: -0.204 kg (-7.2 oz) Exam:   General:  Pt is alert, follows commands appropriately, not in acute distress  HEENT: No icterus, No thrush, No neck mass, [place]/AT  Cardiovascular: RRR, S1/S2, no rubs, no gallops  Respiratory: bibasilar crackles without wheeze R>L  Abdomen: Soft/+BS, non tender, non distended, no guarding  Extremities: No edema, No lymphangitis, No petechiae, No rashes, no synovitis  Data Reviewed: Basic Metabolic Panel:  Recent Labs Lab 03/25/15 0530 03/26/15 0453 03/29/15 1041 03/29/15 1043 03/29/15 1259 03/30/15 0604 03/31/15 0614    NA 140 138  --  138  --  139 142  K 4.1 3.8  --  3.6  --  3.3* 3.9  CL 111 109  --  103  --  107 107  CO2 18* 17*  --  18*  --  19* 19*  GLUCOSE 104* 130*  --  153*  --  94 84  BUN 46* 44*  --  55*  --  53* 49*  CREATININE 3.20* 2.99*  --  3.71*  --  3.53* 3.36*  CALCIUM 5.8* 5.4*  --  5.4*  --  5.3* 5.7*  MG  --  1.2* 1.2*  --   --  1.8 1.7  PHOS  --   --   --   --  3.7 3.3  --    Liver Function Tests:  Recent Labs Lab 03/26/15 0453 03/30/15 0604  AST 21 17  ALT 58* 11*  ALKPHOS 76 93  BILITOT 0.6 0.6  PROT 6.1* 5.8*  ALBUMIN 2.2* 1.8*   No results for input(s): LIPASE, AMYLASE in the last 168 hours. No results for input(s): AMMONIA in the last 168 hours. CBC:  Recent Labs Lab 03/25/15 0530 03/26/15 0453 03/29/15 1043 03/30/15 0604 03/31/15 0614  WBC 8.2 6.7 13.5* 10.2 8.9  NEUTROABS  --  5.6  --   --   --   HGB 8.9* 9.5* 9.5* 9.0* 8.7*  HCT 27.5* 28.6* 28.4* 26.7* 26.1*  MCV 92.3 91.1 87.9 88.1 88.5  PLT 189 166 222 208 254   Cardiac Enzymes:  Recent Labs Lab 03/24/15 1427 03/24/15 2320 03/25/15 0530  TROPONINI 0.05* 0.04* 0.05*   BNP: Invalid input(s): POCBNP CBG:  Recent Labs Lab 03/25/15 1204 03/25/15 1710 03/26/15 0026 03/26/15 0456 03/26/15 1144  GLUCAP 83 142* 76 115* 152*    Recent  Results (from the past 240 hour(s))  Culture, blood (Routine X 2) w Reflex to ID Panel     Status: None (Preliminary result)   Collection Time: 03/29/15 12:40 PM  Result Value Ref Range Status   Specimen Description BLOOD RIGHT ANTECUBITAL  Final   Special Requests BOTTLES DRAWN AEROBIC AND ANAEROBIC 4MLS  Final   Culture NO GROWTH 1 DAY  Final   Report Status PENDING  Incomplete  Culture, blood (Routine X 2) w Reflex to ID Panel     Status: None (Preliminary result)   Collection Time: 03/29/15 12:50 PM  Result Value Ref Range Status   Specimen Description BLOOD LEFT ANTECUBITAL  Final   Special Requests BOTTLES DRAWN AEROBIC AND ANAEROBIC 5CCS  Final    Culture  Setup Time   Final    GRAM POSITIVE COCCI IN CLUSTERS ANAEROBIC BOTTLE ONLY CRITICAL RESULT CALLED TO, READ BACK BY AND VERIFIED WITH: ANITE,RN ON 6E @0437  03/31/15 MKELLY    Culture   Final    GRAM POSITIVE COCCI CULTURE REINCUBATED FOR BETTER GROWTH    Report Status PENDING  Incomplete     Scheduled Meds: . amLODipine  5 mg Oral Daily  . atorvastatin  80 mg Oral q1800  . calcium carbonate  1,250 mg Oral BID WC  . carvedilol  3.125 mg Oral BID WC  . ceFEPime (MAXIPIME) IV  1 g Intravenous Q24H  . cholecalciferol  1,000 Units Oral Daily  . clopidogrel  75 mg Oral Daily  . collagenase   Topical Daily  . darbepoetin (ARANESP) injection - NON-DIALYSIS  40 mcg Subcutaneous Q Sun-1800  . famotidine  20 mg Oral Daily  . feeding supplement (ENSURE ENLIVE)  237 mL Oral BID BM  . ferrous sulfate  325 mg Oral TID WC  . furosemide  20 mg Oral Daily  . guaiFENesin  600 mg Oral BID  . isosorbide mononitrate  30 mg Oral Daily  . mometasone-formoterol  1 puff Inhalation Daily  . omega-3 acid ethyl esters  1 g Oral TID  . sertraline  50 mg Oral Daily  . sodium bicarbonate  650 mg Oral BID  . sodium chloride flush  3 mL Intravenous Q12H  . tamsulosin  0.4 mg Oral Daily  . vancomycin  1,000 mg Intravenous Q48H   Continuous Infusions:    Jamauri Kruzel, DO  [facility] Pager [phone]  If 7PM-7AM, please contact night-coverage [email or web address] Password [email or web address] 03/31/2015, 10:11 AM   LOS: 2 days

## 2015-03-31 NOTE — Progress Notes (Signed)
CRITICAL VALUE ALERT  Critical value received:  Ca 5.7 (Was 5.3 yesterday).  Date of notification:  03/31/15  Time of notification:  0727  Critical value read back:Yes.    Nurse who received alert:  Larena Glassman, RN  MD notified (1st page):  Dr. Carles Collet  Time of first page:  0728  MD notified (2nd page):  Time of second page:  Responding MD:  Dr. Carles Collet  Time MD responded:  [phone] new orders received.

## 2015-03-31 NOTE — Progress Notes (Signed)
Utilization review completed. Khari Mally, RN, BSN. 

## 2015-03-31 NOTE — Hospital Discharge Follow-Up (Signed)
The patient is known to the [facility] ([facility]) at the [facility].  Met with the patient to discuss plans for medical follow up after discharge. The patient's husband was present. The patient was agreeable to continuing to be followed at the [facility] and an appointment was scheduled for 04/09/15 @ 1130 and the information was placed on the AVS. She stated that she has transportation to the clinic and noted that her daughter provides assistance with shopping and meal preparation.  She also receives $16/month of food stamps.   She has been receiving home health nursing services from [place].   She has her prescriptions filled at the [facility].   She also stated that she met with a financial counselor at the hospital today regarding medicaid. Her husband then noted that she will be eligible for medicare next month.  Will continue to follow her hospitalization.

## 2015-03-31 NOTE — Progress Notes (Signed)
[facility]  Patient Status: Active (receiving services up to time of hospitalization)  [facility] is providing the following services: RN and MSW  If patient discharges after hours, please call [phone].   [name] 03/31/2015, 11:16 AM

## 2015-03-31 NOTE — Progress Notes (Signed)
Patient has blood cultures positive for gram + cocci in clusters. MD made aware.

## 2015-03-31 NOTE — Evaluation (Signed)
Physical Therapy Evaluation Patient Details Name: [name] MRN: [id number] DOB: [birth year] Today's Date: 03/31/2015   History of Present Illness  65 y.o. female past medical history that includes type 2 diabetes, hypertension, COPD, diastolic heart failure, on home oxygen, chronic kidney disease stage III, dementia. Presetns with generalized weakness and AMS. Pt with encephalopathy.  Clinical Impression  Pleasant lady presenting today with the PT deficits described below. Pt demonstrates decreased mobility with transfers and ambulating with RW. Requires Min A and close guard for safety in hallway and quickly fatigues. Tolerated activity on RA today with drop in SpO2 to 92% however with breathing techniques quickly improved to 97%. Pt will benefit from continued acute services before D/C home with HHPT and initial 24/7 supervision/assistance from family for safety.    Follow Up Recommendations Supervision/Assistance - 24 hour;Home health PT    Equipment Recommendations  None recommended by PT    Recommendations for Other Services       Precautions / Restrictions Precautions Precautions: Fall Precaution Comments: monitor O2 with activity Restrictions Weight Bearing Restrictions: No      Mobility  Bed Mobility               General bed mobility comments: recieved pt sitting EOB and returned to chair  Transfers Overall transfer level: Needs assistance Equipment used: Rolling walker (2 wheeled) Transfers: Sit to/from Stand Sit to Stand: Min assist         General transfer comment: Min A for initial boost pt able to stabilize herself with RW  Ambulation/Gait Ambulation/Gait assistance: Min guard;Min assist Ambulation Distance (Feet): 200 Feet Assistive device: Rolling walker (2 wheeled) Gait Pattern/deviations: Step-through pattern;Decreased stride length;Shuffle;Antalgic Gait velocity: slow Gait velocity interpretation: <1.8 ft/sec, indicative of risk for  recurrent falls General Gait Details: pt c/o leg pain however to ambulate safely, one LOB going through doorway requring Min A to steady  Stairs Stairs: Yes Stairs assistance: Min assist Stair Management: One rail Right;Sideways;Step to pattern Number of Stairs: 2 General stair comments: increased pain with stairs, BUE on railing for support  Wheelchair Mobility    Modified Rankin (Stroke Patients Only)       Balance Overall balance assessment: Needs assistance Sitting-balance support: Bilateral upper extremity supported;Feet unsupported Sitting balance-Leahy Scale: Fair     Standing balance support: Bilateral upper extremity supported;During functional activity Standing balance-Leahy Scale: Poor                               Pertinent Vitals/Pain Pain Assessment: Faces Faces Pain Scale: Hurts whole lot Pain Location: B legs Pain Descriptors / Indicators: Cramping Pain Intervention(s): Limited activity within patient's tolerance;Monitored during session    [name] expects to be discharged to:: Private residence Living Arrangements: Spouse/significant other;Children Available Help at Discharge: Family;Friend(s);Available 24 hours/day Type of Home: Mobile home Home Access: Stairs to enter Entrance Stairs-Rails: Right;Left Entrance Stairs-Number of Steps: 2 Home Layout: One level Home Equipment: Walker - 2 wheels;Cane - quad Additional Comments: uses home O2 (PRN and at night )    Prior Function Level of Independence: Needs assistance   Gait / Transfers Assistance Needed: pt uses RW  ADL's / Homemaking Assistance Needed: A from daughter for driving and grocery shopping, A from husband for some cooking        Hand Dominance   Dominant Hand: Left    Extremity/Trunk Assessment   Upper Extremity Assessment: Generalized weakness  Lower Extremity Assessment: Generalized weakness      Cervical / Trunk Assessment:  Kyphotic  Communication   Communication: Expressive difficulties  Cognition Arousal/Alertness: Awake/alert Behavior During Therapy: Flat affect Overall Cognitive Status: No family/caregiver present to determine baseline cognitive functioning Area of Impairment: Memory;Following commands;Problem solving     Memory: Decreased short-term memory Following Commands: Follows one step commands with increased time;Follows multi-step commands inconsistently Safety/Judgement: Decreased awareness of safety Awareness: Intellectual Problem Solving: Slow processing;Difficulty sequencing      General Comments General comments (skin integrity, edema, etc.): O2 on RA before activity 97%, with ambulation 92%, returned to chair with rest 98%    Exercises        Assessment/Plan    PT Assessment Patient needs continued PT services  PT Diagnosis Difficulty walking;Acute pain;Generalized weakness   PT Problem List Decreased strength;Decreased activity tolerance;Decreased balance;Decreased coordination;Decreased safety awareness  PT Treatment Interventions DME instruction;Gait training;Stair training;Functional mobility training;Therapeutic activities;Therapeutic exercise;Neuromuscular re-education;Balance training   PT Goals (Current goals can be found in the Care Plan section) Acute Rehab PT Goals Patient Stated Goal: none stated PT Goal Formulation: With patient Time For Goal Achievement: 04/14/15 Potential to Achieve Goals: Good    Frequency Min 3X/week   Barriers to discharge        Co-evaluation               End of Session Equipment Utilized During Treatment: Gait belt Activity Tolerance: Patient limited by pain Patient left: in chair;with chair alarm set;with call bell/phone within reach Nurse Communication: Mobility status         Time: [id number] PT Time Calculation (min) (ACUTE ONLY): 28 min   Charges:   PT Evaluation $PT Eval Low Complexity: 1 Procedure PT  Treatments $Gait Training: 8-22 mins   PT G Codes:        Ara Kussmaul 04-13-15, 11:09 AM Ara Kussmaul, Student Physical Therapist Acute Rehab [phone]

## 2015-04-01 ENCOUNTER — Ambulatory Visit: Payer: Self-pay | Admitting: Internal Medicine

## 2015-04-01 DIAGNOSIS — Y95 Nosocomial condition: Secondary | ICD-10-CM

## 2015-04-01 DIAGNOSIS — B9561 Methicillin susceptible Staphylococcus aureus infection as the cause of diseases classified elsewhere: Secondary | ICD-10-CM

## 2015-04-01 DIAGNOSIS — R7881 Bacteremia: Secondary | ICD-10-CM

## 2015-04-01 DIAGNOSIS — E876 Hypokalemia: Secondary | ICD-10-CM

## 2015-04-01 DIAGNOSIS — J15211 Pneumonia due to Methicillin susceptible Staphylococcus aureus: Secondary | ICD-10-CM

## 2015-04-01 LAB — BASIC METABOLIC PANEL
ANION GAP: 12 (ref 5–15)
BUN: 46 mg/dL — ABNORMAL HIGH (ref 6–20)
CHLORIDE: 108 mmol/L (ref 101–111)
CO2: 22 mmol/L (ref 22–32)
Calcium: 6 mg/dL — CL (ref 8.9–10.3)
Creatinine, Ser: 3.1 mg/dL — ABNORMAL HIGH (ref 0.44–1.00)
GFR, EST AFRICAN AMERICAN: 17 mL/min — AB (ref >60)
GFR, EST NON AFRICAN AMERICAN: 15 mL/min — AB (ref >60)
Glucose, Bld: 97 mg/dL (ref 65–99)
Potassium: 3.4 mmol/L — ABNORMAL LOW (ref 3.5–5.1)
SODIUM: 142 mmol/L (ref 135–145)

## 2015-04-01 LAB — CULTURE, BLOOD (ROUTINE X 2)

## 2015-04-01 LAB — LEGIONELLA ANTIGEN, URINE

## 2015-04-01 LAB — PROCALCITONIN: Procalcitonin: 0.85 ng/mL

## 2015-04-01 LAB — CALCIUM, IONIZED: Calcium, Ionized, Serum: 3.2 mg/dL — ABNORMAL LOW (ref 4.5–5.6)

## 2015-04-01 MED ORDER — CEFAZOLIN SODIUM-DEXTROSE 2-3 GM-% IV SOLR
2.0000 g | Freq: Two times a day (BID) | INTRAVENOUS | Status: DC
  Administered 2015-04-01 – 2015-04-07 (×12): 2 g via INTRAVENOUS
  Filled 2015-04-01 (×17): qty 50

## 2015-04-01 MED ORDER — CALCIUM CARBONATE 1250 (500 CA) MG PO TABS
1250.0000 mg | ORAL_TABLET | Freq: Three times a day (TID) | ORAL | Status: DC
  Administered 2015-04-02 – 2015-04-03 (×5): 1250 mg via ORAL
  Filled 2015-04-01 (×5): qty 1

## 2015-04-01 MED ORDER — POTASSIUM CHLORIDE CRYS ER 20 MEQ PO TBCR
20.0000 meq | EXTENDED_RELEASE_TABLET | Freq: Once | ORAL | Status: AC
  Administered 2015-04-01: 20 meq via ORAL
  Filled 2015-04-01: qty 1

## 2015-04-01 NOTE — Progress Notes (Signed)
PROGRESS NOTE    [name] [id number] DOB: 08-09-1950 DOA: 03/29/2015 PCP: Pcp Not In System  HPI/Brief narrative 65 year old female with multiple medical problems including but not limited to CKD stage IV, COPD on home oxygen when necessary, chronic diastolic CHF, hypertension, recent GI bleed, CAD, hyperlipidemia, diet-controlled diabetes mellitus presented with 2-3 day history of cough, dyspnea on exertion, and subjective fevers. Notably, the patient was recently discharged from the hospital on 03/26/2015 after treatment for upper GI bleed. She underwent endoscopy (Dr. Wilfrid Lund III) which did not show evidence of active bleeding but there was a suspected distal intestinal ulcer or AVM. She was also seen by cardiology secondary to chest discomfort and elevated troponins. The patient had a Myoview on 01/22/2015 with intermediate risk, EF 52% They did not feel that she was a candidate for open heart surgery or cardiac catheterization or percutaneous intervention given her bleeding risk and renal failure. She was to be managed medically from a cardiac standpoint. Cardiology felt that she could be continued with clopidogrel monotherapy after discharge. At the time of this admission, the patient was noted to be hypocalcemic with a corrected calcium of 6.8. The patient was recently treated for hypercalcemia with intravenous pamidronate which was thought to be due to dehydration and calcium supplementation.   Assessment/Plan:   1. Healthcare associated pneumonia: Treating empirically with IV cefepime and vancomycin. CT chest without contrast: Showed bilateral nodular masslike opacities right >left with mediastinal lymphadenopathy. Treat for likely infectious etiology and will need outpatient surveillance CT after pneumonia has been treated. If lesions persist, will need further evaluation with pulmonary consultation and possible bronchoscopy. 2. Acute on chronic respiratory failure with  hypoxia: Likely secondary to pneumonia. Improving. 3. MSSA bacteremia: One of 2 blood cultures positive on admission. ID input appreciated. Surveillance blood cultures negative to date. Continue vancomycin pending ID recommendations-could likely change to IV Ancef. 4. Acute on stage III-IV chronic kidney disease/renal artery stenosis: Baseline creatinine 3-3 0.3. Metabolic acidosis is improved. Renal function back to baseline. Outpatient follow-up with Dr. Florene Glen, nephrology. 5. Recent upper GI bleed: Has persistent dark stools which may be from iron supplementation. Hemoglobin stable. Ferritin 360. 6. Hypocalcemia/low vitamin D: Persistent hypocalcemia in the setting of recent administration of IV pamidronate. Vitamin D level on 1/13 < 5 and repeat vitamin D level on 1/27 after supplementation was 7.2. Received vitamin D 50,000 units 1 and started 1000 units daily. Recommend repeating vitamin D level in next 4-6 weeks. Started on oral calcium 1000-50 mg twice a day. Also received additional doses of IV calcium gluconate on 2/7. No clinical manifestations of hypocalcemia. Corrected serum calcium: 7.8. Increase oral calcium supplements. 7. Hypomagnesemia: Replaced 8. Anemia of chronic kidney disease/iron deficiency and recent GI bleed: RBC folate 1457, iron saturation 5%, B12: 1290. Continue iron sulfate. Status post Aranesp. Hemoglobin stable. 9. COPD/chronic hypoxic respiratory failure: As per patient, utilizes oxygen only at night or when necessary. Stable. 10. Chronic diastolic CHF: Compensated. 11. Essential hypertension: Reasonable inpatient control. Continue amlodipine, carvedilol and Imdur. ARB discontinued during last admission due to CKD 12. Former tobacco abuse: Quit 3 months ago. 13. Hyperlipidemia: Continue Lipitor and omega-3 fatty acids 14. Diet-controlled DM 2: A1c: 6.2 15. CAD: Asymptomatic 39. Protein calorie malnutrition: Continue nutritional supplementations 17. Lewy body dementia  without behavioral disturbance: Mental status at baseline 18. Sacral ulcer: Per [place] RN 19. Hypokalemia: Replace and follow.   DVT prophylaxis: SCDs Code Status: Full Family Communication: None at bedside Disposition Plan: DC  home when medically stable   Consultants:  Infectious disease  Procedures:  None  Antimicrobials:  IV cefepime 2/5 >  IV vancomycin 2/5 >   Subjective: Denies complaints. No pain, dyspnea or cough reported.  Objective: Filed Vitals:   03/31/15 2048 04/01/15 0433 04/01/15 0509 04/01/15 0942  BP: 153/53  135/49 136/46  Pulse: 67  58 71  Temp: 98 F (36.7 C)  97.7 F (36.5 C) 97.8 F (36.6 C)  TempSrc: Oral  Oral Oral  Resp: 16  16 18   Height:      Weight:  63.3 kg (139 lb 8.8 oz)    SpO2: 96%  96% 97%    Intake/Output Summary (Last 24 hours) at 04/01/15 1733 Last data filed at 04/01/15 1300  Gross per 24 hour  Intake    720 ml  Output    600 ml  Net    120 ml   Filed Weights   03/30/15 0500 03/31/15 0411 04/01/15 0433  Weight: 63.2 kg (139 lb 5.3 oz) 63.3 kg (139 lb 8.8 oz) 63.3 kg (139 lb 8.8 oz)    Exam:  General exam: Small built and frail pleasant middle-aged female lying comfortably supine in bed. Respiratory system: Clear. No increased work of breathing. Cardiovascular system: S1 & S2 heard, RRR. No JVD, murmurs, gallops, clicks or pedal edema. Telemetry: Sinus rhythm. Gastrointestinal system: Abdomen is nondistended, soft and nontender. Normal bowel sounds heard. Central nervous system: Alert and oriented. No focal neurological deficits. Extremities: Symmetric 5 x 5 power.   Data Reviewed: Basic Metabolic Panel:  Recent Labs Lab 03/26/15 0453 03/29/15 1041 03/29/15 1043 03/29/15 1259 03/30/15 0604 03/31/15 0614 04/01/15 0523  NA 138  --  138  --  139 142 142  K 3.8  --  3.6  --  3.3* 3.9 3.4*  CL 109  --  103  --  107 107 108  CO2 17*  --  18*  --  19* 19* 22  GLUCOSE 130*  --  153*  --  94 84 97  BUN 44*  --   55*  --  53* 49* 46*  CREATININE 2.99*  --  3.71*  --  3.53* 3.36* 3.10*  CALCIUM 5.4*  --  5.4*  --  5.3* 5.7* 6.0*  MG 1.2* 1.2*  --   --  1.8 1.7  --   PHOS  --   --   --  3.7 3.3  --   --    Liver Function Tests:  Recent Labs Lab 03/26/15 0453 03/30/15 0604  AST 21 17  ALT 58* 11*  ALKPHOS 76 93  BILITOT 0.6 0.6  PROT 6.1* 5.8*  ALBUMIN 2.2* 1.8*   No results for input(s): LIPASE, AMYLASE in the last 168 hours. No results for input(s): AMMONIA in the last 168 hours. CBC:  Recent Labs Lab 03/26/15 0453 03/29/15 1043 03/30/15 0604 03/31/15 0614  WBC 6.7 13.5* 10.2 8.9  NEUTROABS 5.6  --   --   --   HGB 9.5* 9.5* 9.0* 8.7*  HCT 28.6* 28.4* 26.7* 26.1*  MCV 91.1 87.9 88.1 88.5  PLT 166 222 208 254   Cardiac Enzymes: No results for input(s): CKTOTAL, CKMB, CKMBINDEX, TROPONINI in the last 168 hours. BNP (last 3 results) No results for input(s): PROBNP in the last 8760 hours. CBG:  Recent Labs Lab 03/26/15 0026 03/26/15 0456 03/26/15 1144  GLUCAP 76 115* 152*    Recent Results (from the past 240 hour(s))  Culture, blood (  Routine X 2) w Reflex to ID Panel     Status: None (Preliminary result)   Collection Time: 03/29/15 12:40 PM  Result Value Ref Range Status   Specimen Description BLOOD RIGHT ANTECUBITAL  Final   Special Requests BOTTLES DRAWN AEROBIC AND ANAEROBIC 4MLS  Final   Culture NO GROWTH 3 DAYS  Final   Report Status PENDING  Incomplete  Culture, blood (Routine X 2) w Reflex to ID Panel     Status: None   Collection Time: 03/29/15 12:50 PM  Result Value Ref Range Status   Specimen Description BLOOD LEFT ANTECUBITAL  Final   Special Requests BOTTLES DRAWN AEROBIC AND ANAEROBIC 5CCS  Final   Culture  Setup Time   Final    GRAM POSITIVE COCCI IN CLUSTERS ANAEROBIC BOTTLE ONLY CRITICAL RESULT CALLED TO, READ BACK BY AND VERIFIED WITH: ANITE,RN ON 6E @0437  03/31/15 MKELLY    Culture STAPHYLOCOCCUS AUREUS  Final   Report Status 04/01/2015 FINAL   Final   Organism ID, Bacteria STAPHYLOCOCCUS AUREUS  Final      Susceptibility   Staphylococcus aureus - MIC*    CIPROFLOXACIN <=0.5 SENSITIVE Sensitive     ERYTHROMYCIN <=0.25 SENSITIVE Sensitive     GENTAMICIN <=0.5 SENSITIVE Sensitive     OXACILLIN 0.5 SENSITIVE Sensitive     TETRACYCLINE <=1 SENSITIVE Sensitive     VANCOMYCIN <=0.5 SENSITIVE Sensitive     TRIMETH/SULFA <=10 SENSITIVE Sensitive     CLINDAMYCIN <=0.25 SENSITIVE Sensitive     RIFAMPIN <=0.5 SENSITIVE Sensitive     Inducible Clindamycin NEGATIVE Sensitive     * STAPHYLOCOCCUS AUREUS  Culture, blood (routine x 2)     Status: None (Preliminary result)   Collection Time: 03/31/15  7:20 PM  Result Value Ref Range Status   Specimen Description BLOOD LEFT ANTECUBITAL  Final   Special Requests BOTTLES DRAWN AEROBIC AND ANAEROBIC 5CC   Final   Culture NO GROWTH < 24 HOURS  Final   Report Status PENDING  Incomplete  Culture, blood (routine x 2)     Status: None (Preliminary result)   Collection Time: 03/31/15  7:30 PM  Result Value Ref Range Status   Specimen Description BLOOD RIGHT ANTECUBITAL  Final   Special Requests BOTTLES DRAWN AEROBIC AND ANAEROBIC 5CC   Final   Culture NO GROWTH < 24 HOURS  Final   Report Status PENDING  Incomplete         Studies: No results found.      Scheduled Meds: . amLODipine  5 mg Oral Daily  . atorvastatin  80 mg Oral q1800  . calcium carbonate  1,250 mg Oral BID WC  . carvedilol  3.125 mg Oral BID WC  . ceFEPime (MAXIPIME) IV  1 g Intravenous Q24H  . cholecalciferol  1,000 Units Oral Daily  . clopidogrel  75 mg Oral Daily  . collagenase   Topical Daily  . darbepoetin (ARANESP) injection - NON-DIALYSIS  40 mcg Subcutaneous Q Sun-1800  . famotidine  20 mg Oral Daily  . feeding supplement (ENSURE ENLIVE)  237 mL Oral BID BM  . ferrous sulfate  325 mg Oral TID WC  . furosemide  20 mg Oral Daily  . guaiFENesin  600 mg Oral BID  . isosorbide mononitrate  30 mg Oral Daily    . mometasone-formoterol  1 puff Inhalation Daily  . omega-3 acid ethyl esters  1 g Oral TID  . sertraline  50 mg Oral Daily  . sodium bicarbonate  650  mg Oral BID  . sodium chloride flush  3 mL Intravenous Q12H  . tamsulosin  0.4 mg Oral Daily  . vancomycin  1,000 mg Intravenous Q48H   Continuous Infusions:   Principal Problem:   HCAP (healthcare-associated pneumonia) Active Problems:   Hyperlipidemia   Anemia, chronic disease   Chronic obstructive pulmonary disease (HCC)   Renal artery stenosis (HCC)   Peripheral vascular disease (HCC)   Upper GI bleed from jejunum, AVM vs. Dieaulafoy's lesion   Diet-controlled diabetes mellitus (HCC)   Iron deficiency anemia   On home oxygen therapy   CKD (chronic kidney disease), stage IV (HCC)   CAD (coronary artery disease)   Chronic diastolic CHF (congestive heart failure) ([place])   Essential hypertension   Former tobacco use   Protein-calorie malnutrition ([place])   Lewy body dementia without behavioral disturbance   Hypocalcemia   Sacral ulcer   Acute respiratory failure (HCC)   Hypomagnesemia   Low serum vitamin D   Metabolic acidosis   Malnutrition of moderate degree    Time spent: 25 minutes.    Vernell Leep, MD, FACP, FHM. [facility] Pager [phone]  If 7PM-7AM, please contact night-coverage [email or web address] Password [email or web address] 04/01/2015, 5:33 PM    LOS: 3 days

## 2015-04-01 NOTE — Consult Note (Signed)
[facility]  Total days of antibiotics 4        Day 4 vanco/cefepime               Reason for Consult: staph aureus bacteremia    Referring Physician: [email or web address]  Principal Problem:   HCAP (healthcare-associated pneumonia) Active Problems:   Hyperlipidemia   Anemia, chronic disease   Chronic obstructive pulmonary disease (HCC)   Renal artery stenosis (HCC)   Peripheral vascular disease ([place])   Upper GI bleed from jejunum, AVM vs. Dieaulafoy's lesion   Diet-controlled diabetes mellitus ([place])   Iron deficiency anemia   On home oxygen therapy   CKD (chronic kidney disease), stage IV (HCC)   CAD (coronary artery disease)   Chronic diastolic CHF (congestive heart failure) ([place])   Essential hypertension   Former tobacco use   Protein-calorie malnutrition ([place])   Lewy body dementia without behavioral disturbance   Hypocalcemia   Sacral ulcer   Acute respiratory failure (HCC)   Hypomagnesemia   Low serum vitamin D   Metabolic acidosis   Malnutrition of moderate degree    HPI: NATALINA WIETING is a 65 y.o. female 66 year old female with multiple medical problems including but not limited to CKD stage IV, COPD on home oxygen when necessary, chronic diastolic CHF, hypertension, recent GI bleed, CAD, hyperlipidemia, diet-controlled diabetes mellitus presented with 2-3 day history of cough, dyspnea on exertion, and subjective fevers. Notably, the patient was recently discharged from the hospital on 03/26/2015 after treatment for upper GI bleed. She underwent endoscopy (Dr. Wilfrid Lund III) which did not show evidence of active bleeding but there was a suspected distal intestinal ulcer or AVM. She was also seen by cardiology secondary to chest discomfort and elevated troponins. The patient had a Myoview on 01/22/2015 with intermediate risk, EF 52% They did not feel that she was a candidate for open heart surgery or cardiac catheterization or percutaneous intervention  given her bleeding risk and renal failure. She was to be managed medically from a cardiac standpoint. Cardiology felt that she could be continued with clopidogrel monotherapy after discharge. At the time of this admission, the patient was noted to be hypocalcemic with a corrected calcium of 6.8. The patient was recently treated for hypercalcemia with intravenous pamidronate which was thought to be due to dehydration and calcium supplementation  Past Medical History  Diagnosis Date  . Peripheral vascular disease ([place])     a.s /p left common iliac and external iliac artery stents. b. H/o subclavian, mesenteric, and celiac artery stenosis (see below).  . Diabetes mellitus   . COPD (chronic obstructive pulmonary disease) (HCC)     home oxygen  . Hyperlipidemia   . Depression   . Chronic diastolic CHF (congestive heart failure) ([place])   . Essential hypertension   . Renal artery stenosis ([place])     a. Duplex 2014: no obvious evidence of hemodynamically significant stenosis >60%, bilateral intrarenal arteries exhibit absent diastolic flow. There is evidence of elevated velocities of the mid aorta. 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  [name] CAD (coronary artery disease)   . Iron deficiency anemia   . Diabetic retinopathy ([place])   . 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 Carotid artery disease ([place])     a. Duplex 12/2014: left CEA patent with elevated velocities, 60-79% RICA (stable over  serial exams), 02-63% LICA stenosis (stable over serial exams), 50% LECA, elevated velocities in bilateral subclavian arteries.  . 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 CKD (chronic kidney disease), stage III   . Tobacco abuse   . Bradycardia     a. Limiting BB titration.  . Anemia, chronic disease 05/16/2008    Qualifier: Diagnosis of  By:  Percival Spanish, MD, Farrel Gordon    . Dementia   . Protein-calorie malnutrition ([place]) 01/24/2015    Allergies: No Known Allergies  Current antibiotics:   MEDICATIONS: . amLODipine  5 mg Oral Daily  . atorvastatin  80 mg Oral q1800  . [START ON 04/02/2015] calcium carbonate  1,250 mg Oral TID WC  . carvedilol  3.125 mg Oral BID WC  . ceFEPime (MAXIPIME) IV  1 g Intravenous Q24H  . cholecalciferol  1,000 Units Oral Daily  . clopidogrel  75 mg Oral Daily  . collagenase   Topical Daily  . darbepoetin (ARANESP) injection - NON-DIALYSIS  40 mcg Subcutaneous Q Sun-1800  . famotidine  20 mg Oral Daily  . feeding supplement (ENSURE ENLIVE)  237 mL Oral BID BM  . ferrous sulfate  325 mg Oral TID WC  . furosemide  20 mg Oral Daily  . guaiFENesin  600 mg Oral BID  . isosorbide mononitrate  30 mg Oral Daily  . mometasone-formoterol  1 puff Inhalation Daily  . omega-3 acid ethyl esters  1 g Oral TID  . sertraline  50 mg Oral Daily  . sodium bicarbonate  650 mg Oral BID  . sodium chloride flush  3 mL Intravenous Q12H  . tamsulosin  0.4 mg Oral Daily  . vancomycin  1,000 mg Intravenous Q48H    Social History  Substance Use Topics  . Smoking status: Former Smoker -- 0.00 packs/day for 36 years    Types: Cigarettes    Quit date: 02/11/2015  . Smokeless tobacco: Never Used     Comment: uses e-cig  . Alcohol Use: No    Family History  Problem Relation Age of Onset  . Hypertension Mother   . Heart attack Mother   . Hypertension Father   . Angina Father   . Cancer Sister   . Cancer Brother   . Stroke Neg Hx    Review of Systems  Constitutional: Negative for fever, chills, diaphoresis, activity change, appetite change, fatigue and unexpected weight change.  HENT: Negative for congestion, sore throat, rhinorrhea, sneezing, trouble swallowing and sinus pressure.  Eyes: Negative for photophobia and visual disturbance.  Respiratory: Negative for cough, chest tightness, shortness of breath,  wheezing and stridor.  Cardiovascular: Negative for chest pain, palpitations and leg swelling.  Gastrointestinal: Negative for nausea, vomiting, abdominal pain, diarrhea, constipation, blood in stool, abdominal distention and anal bleeding.  Genitourinary: Negative for dysuria, hematuria, flank pain and difficulty urinating.  Musculoskeletal: Negative for myalgias, back pain, joint swelling, arthralgias and gait problem.  Skin: Negative for color change, pallor, rash and wound.  Neurological: Negative for dizziness, tremors, weakness and light-headedness.  Hematological: Negative for adenopathy. Does not bruise/bleed easily.  Psychiatric/Behavioral: Negative for behavioral problems, confusion, sleep disturbance, dysphoric mood, decreased concentration and agitation.     OBJECTIVE: Temp:  [97.7 F (36.5 C)-98 F (36.7 C)] 98 F (36.7 C) (02/08 1715) Pulse Rate:  [58-71] 69 (02/08 1715) Resp:  [16-18] 18 (02/08 1715) BP: (135-153)/(46-53) 140/51 mmHg (02/08 1715) SpO2:  [96 %-97 %] 96 % (02/08 1715) Weight:  [139 lb 8.8  oz (63.3 kg)] 139 lb 8.8 oz (63.3 kg) (02/08 0433) Physical Exam  Constitutional:  oriented to person, place, and time. appears well-developed and well-nourished. No distress.  HENT: [place]/AT, PERRLA, no scleral icterus Mouth/Throat: Oropharynx is clear and moist. No oropharyngeal exudate.  Cardiovascular: Normal rate, regular rhythm and normal heart sounds. Exam reveals no gallop and no friction rub.  No murmur heard.  Pulmonary/Chest: Effort normal and breath sounds normal. No respiratory distress.  has no wheezes.  Neck = supple, no nuchal rigidity Abdominal: Soft. Bowel sounds are normal.  exhibits no distension. There is no tenderness.  Lymphadenopathy: no cervical adenopathy. No axillary adenopathy Neurological: alert and oriented to person, place, and time.  Skin: Skin is warm and dry. No rash noted. No erythema.  Psychiatric: a normal mood and affect.  behavior is  normal.   LABS: Results for orders placed or performed during the hospital encounter of 03/29/15 (from the past 48 hour(s))  Basic metabolic panel     Status: Abnormal   Collection Time: 03/31/15  6:14 AM  Result Value Ref Range   Sodium 142 135 - 145 mmol/L   Potassium 3.9 3.5 - 5.1 mmol/L   Chloride 107 101 - 111 mmol/L   CO2 19 (L) 22 - 32 mmol/L   Glucose, Bld 84 65 - 99 mg/dL   BUN 49 (H) 6 - 20 mg/dL   Creatinine, Ser 3.36 (H) 0.44 - 1.00 mg/dL   Calcium 5.7 (LL) 8.9 - 10.3 mg/dL    Comment: CRITICAL RESULT CALLED TO, READ BACK BY AND VERIFIED WITH: C.ECKELMANN,RN 03/31/15 0728 BY BSLADE    GFR calc non Af Amer 13 (L) >60 mL/min   GFR calc Af Amer 16 (L) >60 mL/min    Comment: (NOTE) The eGFR has been calculated using the CKD EPI equation. This calculation has not been validated in all clinical situations. eGFR's persistently <60 mL/min signify possible Chronic Kidney Disease.    Anion gap 16 (H) 5 - 15  Magnesium     Status: None   Collection Time: 03/31/15  6:14 AM  Result Value Ref Range   Magnesium 1.7 1.7 - 2.4 mg/dL  CBC     Status: Abnormal   Collection Time: 03/31/15  6:14 AM  Result Value Ref Range   WBC 8.9 4.0 - 10.5 K/uL   RBC 2.95 (L) 3.87 - 5.11 MIL/uL   Hemoglobin 8.7 (L) 12.0 - 15.0 g/dL   HCT 26.1 (L) 36.0 - 46.0 %   MCV 88.5 78.0 - 100.0 fL   MCH 29.5 26.0 - 34.0 pg   MCHC 33.3 30.0 - 36.0 g/dL   RDW 15.6 (H) 11.5 - 15.5 %   Platelets 254 150 - 400 K/uL  Calcium, ionized     Status: Abnormal   Collection Time: 03/31/15  6:14 AM  Result Value Ref Range   Calcium, Ionized, Serum 3.2 (L) 4.5 - 5.6 mg/dL    Comment: (NOTE) Performed At: [facility] [place], [place] [id number] Lindon Romp MD ZD:[id number]   Strep pneumoniae urinary antigen     Status: None   Collection Time: 03/31/15 11:02 AM  Result Value Ref Range   Strep Pneumo Urinary Antigen NEGATIVE NEGATIVE    Comment:        Infection due to S.  pneumoniae cannot be absolutely ruled out since the antigen present may be below the detection limit of the test.   Legionella antigen, urine (not at [facility])  Status: None   Collection Time: 03/31/15 11:02 AM  Result Value Ref Range   Specimen Description URINE, RANDOM    Special Requests NONE    Legionella Antigen, Urine      Negative for Legionella pneumophila serogroup 1                                                              Legionella pneumophila serogroup 1 antigen can be detected in urine within 2 to 3 days of infection and may persist even after treatment. This  assay does not detect other Legionella species or serogroups. Performed at [facility]    Report Status 04/01/2015 FINAL   Culture, blood (routine x 2)     Status: None (Preliminary result)   Collection Time: 03/31/15  7:20 PM  Result Value Ref Range   Specimen Description BLOOD LEFT ANTECUBITAL    Special Requests BOTTLES DRAWN AEROBIC AND ANAEROBIC 5CC     Culture NO GROWTH < 24 HOURS    Report Status PENDING   Culture, blood (routine x 2)     Status: None (Preliminary result)   Collection Time: 03/31/15  7:30 PM  Result Value Ref Range   Specimen Description BLOOD RIGHT ANTECUBITAL    Special Requests BOTTLES DRAWN AEROBIC AND ANAEROBIC 5CC     Culture NO GROWTH < 24 HOURS    Report Status PENDING   Procalcitonin     Status: None   Collection Time: 04/01/15  5:23 AM  Result Value Ref Range   Procalcitonin 0.85 ng/mL    Comment:        Interpretation: PCT > 0.5 ng/mL and <= 2 ng/mL: Systemic infection (sepsis) is possible, but other conditions are known to elevate PCT as well. (NOTE)         ICU PCT Algorithm               Non ICU PCT Algorithm    ----------------------------     ------------------------------         PCT < 0.25 ng/mL                 PCT < 0.1 ng/mL     Stopping of antibiotics            Stopping of antibiotics       strongly encouraged.               strongly  encouraged.    ----------------------------     ------------------------------       PCT level decrease by               PCT < 0.25 ng/mL       >= 80% from peak PCT       OR PCT 0.25 - 0.5 ng/mL          Stopping of antibiotics                                             encouraged.     Stopping of antibiotics           encouraged.    ----------------------------     ------------------------------  PCT level decrease by              PCT >= 0.25 ng/mL       < 80% from peak PCT        AND PCT >= 0.5 ng/mL             Continuing antibiotics                                              encouraged.       Continuing antibiotics            encouraged.    ----------------------------     ------------------------------     PCT level increase compared          PCT > 0.5 ng/mL         with peak PCT AND          PCT >= 0.5 ng/mL             Escalation of antibiotics                                          strongly encouraged.      Escalation of antibiotics        strongly encouraged.   Basic metabolic panel     Status: Abnormal   Collection Time: 04/01/15  5:23 AM  Result Value Ref Range   Sodium 142 135 - 145 mmol/L   Potassium 3.4 (L) 3.5 - 5.1 mmol/L   Chloride 108 101 - 111 mmol/L   CO2 22 22 - 32 mmol/L   Glucose, Bld 97 65 - 99 mg/dL   BUN 46 (H) 6 - 20 mg/dL   Creatinine, Ser 3.10 (H) 0.44 - 1.00 mg/dL   Calcium 6.0 (LL) 8.9 - 10.3 mg/dL    Comment: CRITICAL RESULT CALLED TO, READ BACK BY AND VERIFIED WITH: GREENAWALT,R RN @ [phone] 04/01/15 LEONARD,A    GFR calc non Af Amer 15 (L) >60 mL/min   GFR calc Af Amer 17 (L) >60 mL/min    Comment: (NOTE) The eGFR has been calculated using the CKD EPI equation. This calculation has not been validated in all clinical situations. eGFR's persistently <60 mL/min signify possible Chronic Kidney Disease.    Anion gap 12 5 - 15    MICRO: 2/5 Blood cx 1 of 4 bottles staph aureus : oxa S  2/7 blood cx ngtd IMAGING: Chest ct  2/6 FINDINGS: Mediastinum/Lymph Nodes: Heart size is borderline enlarged. There is no significant pericardial fluid, thickening or pericardial calcification. There is atherosclerosis of the thoracic aorta, the great vessels of the mediastinum and the coronary arteries, including calcified atherosclerotic plaque in the left main, left anterior descending, left circumflex and right coronary arteries. Multiple enlarged mediastinal lymph nodes are noted measuring up to 16 mm in short axis in the right paratracheal nodal station. Prominent tissue in the hilar regions is also noted, which could suggest additional lymphadenopathy, although this is poorly evaluated on today's noncontrast CT examination. Esophagus is unremarkable in appearance. No axillary lymphadenopathy.  Lungs/Pleura: In the lungs bilaterally (right greater than left) there multiple nodular and mass-like areas of apparent airspace consolidation, largest of which is in the posterior aspect of the right lower lobe (image 38 of  series 3) measuring 4.1 x 2.4 cm. The majority of these areas have some surrounding ground-glass attenuation. There is also some associated interlobular septal thickening in the lung bases bilaterally. No pleural effusions.  Upper Abdomen: Status post cholecystectomy.  Musculoskeletal/Soft Tissues: There are no aggressive appearing lytic or blastic lesions noted in the visualized portions of the skeleton.  IMPRESSION: 1. Multiple nodular and mass-like areas of apparent airspace consolidation, asymmetrically distributed in the lungs bilaterally (right greater than left), which have an unusual appearance concerning for atypical infection. In particular, findings are concerning for potential fungal infection such as angioinvasive aspergillosis. 2. Associated with this there is mediastinal lymphadenopathy, as above. Although this could be reactive, the extensive lymphadenopathy is somewhat  atypical for reactive lymphadenopathy, and accordingly, clinical correlation for signs and symptoms of   Assessment/Plan:  65yo F admitted for HCAP found to also have staph aureus bacteremia isolated in 1 of 4 bottles. She has impressive chest CT showing multiple nodular consolidation.   - recommend to d/c vancomycin and cefepime and switch to cefazolin renally dosed  - would get TTE to see if signs of vegetation,   - will follow repeat blood cx to see if she has persistent bacteremia  - pneumonia = presumably related to staph aureus, will check with radiology to see if this could be c/w septic emboli though they appear larger than one would expect  - ckd= will d/c vancomycin to minimize nephrotoxicity

## 2015-04-02 LAB — RENAL FUNCTION PANEL
ALBUMIN: 1.8 g/dL — AB (ref 3.5–5.0)
Anion gap: 15 (ref 5–15)
BUN: 45 mg/dL — AB (ref 6–20)
CHLORIDE: 106 mmol/L (ref 101–111)
CO2: 21 mmol/L — ABNORMAL LOW (ref 22–32)
CREATININE: 2.98 mg/dL — AB (ref 0.44–1.00)
Calcium: 6.1 mg/dL — CL (ref 8.9–10.3)
GFR calc Af Amer: 18 mL/min — ABNORMAL LOW (ref >60)
GFR, EST NON AFRICAN AMERICAN: 16 mL/min — AB (ref >60)
GLUCOSE: 91 mg/dL (ref 65–99)
PHOSPHORUS: 2.5 mg/dL (ref 2.5–4.6)
Potassium: 3.7 mmol/L (ref 3.5–5.1)
Sodium: 142 mmol/L (ref 135–145)

## 2015-04-02 LAB — CBC
HCT: 29.2 % — ABNORMAL LOW (ref 36.0–46.0)
Hemoglobin: 9.6 g/dL — ABNORMAL LOW (ref 12.0–15.0)
MCH: 29.2 pg (ref 26.0–34.0)
MCHC: 32.9 g/dL (ref 30.0–36.0)
MCV: 88.8 fL (ref 78.0–100.0)
PLATELETS: 341 10*3/uL (ref 150–400)
RBC: 3.29 MIL/uL — ABNORMAL LOW (ref 3.87–5.11)
RDW: 15.7 % — AB (ref 11.5–15.5)
WBC: 11.9 10*3/uL — AB (ref 4.0–10.5)

## 2015-04-02 NOTE — Hospital Discharge Follow-Up (Signed)
[facility] at [place]:  Patient known to the [facility] at [place]. Met with patient at bedside. Patient aware of upcoming [facility] appointment on 04/09/15 at 1130 with Dr. Jarold Song. Appointment on AVS.  This Case Manager reminded her she will receive a post-discharge follow-up phone call after discharge. Patient verbalized understanding.  Will continue to follow patient's clinical progress closely.

## 2015-04-02 NOTE — Progress Notes (Signed)
PROGRESS NOTE    [name] [id number] DOB: 07-Nov-1950 DOA: 03/29/2015 PCP: Pcp Not In System  HPI/Brief narrative 65 year old female with multiple medical problems including but not limited to CKD stage IV, COPD on home oxygen when necessary, chronic diastolic CHF, hypertension, recent GI bleed, CAD, hyperlipidemia, diet-controlled diabetes mellitus presented with 2-3 day history of cough, dyspnea on exertion, and subjective fevers. Notably, the patient was recently discharged from the hospital on 03/26/2015 after treatment for upper GI bleed. She underwent endoscopy (Dr. Wilfrid Lund III) which did not show evidence of active bleeding but there was a suspected distal intestinal ulcer or AVM. She was also seen by cardiology secondary to chest discomfort and elevated troponins. The patient had a Myoview on 01/22/2015 with intermediate risk, EF 52% They did not feel that she was a candidate for open heart surgery or cardiac catheterization or percutaneous intervention given her bleeding risk and renal failure. She was to be managed medically from a cardiac standpoint. Cardiology felt that she could be continued with clopidogrel monotherapy after discharge. At the time of this admission, the patient was noted to be hypocalcemic with a corrected calcium of 6.8. The patient was recently treated for hypercalcemia with intravenous pamidronate which was thought to be due to dehydration and calcium supplementation.   Assessment/Plan:   1. Healthcare associated pneumonia: Treated empirically with IV cefepime and vancomycin. CT chest without contrast: Showed bilateral nodular masslike opacities right >left with mediastinal lymphadenopathy. Treat for likely infectious etiology and will need outpatient surveillance CT after pneumonia has been treated. If lesions persist, will need further evaluation with pulmonary consultation and possible bronchoscopy. ID input appreciated. Given Staphaureus bacteremia,  pneumonia may be same. Antibiotics transitioned to IV cefazolin. 2. Acute on chronic respiratory failure with hypoxia: Likely secondary to pneumonia. Improving. 3. MSSA bacteremia: One of 2 blood cultures positive on admission. ID input appreciated. Surveillance blood cultures negative to date. Changed to IV Ancef. Checking 2 D Echo 4. Acute on stage III-IV chronic kidney disease/renal artery stenosis: Baseline creatinine 3-3 0.3. Metabolic acidosis has improved. Renal function back to baseline. Outpatient follow-up with Dr. Florene Glen, nephrology. 5. Recent upper GI bleed: Has persistent dark stools which may be from iron supplementation. Hemoglobin stable. Ferritin 360. 6. Hypocalcemia/low vitamin D/recent hypercalcemia: Persistent hypocalcemia in the setting of recent administration of IV pamidronate. Vitamin D level on 1/13 < 5 and repeat vitamin D level on 1/27 after supplementation was 7.2. Received vitamin D 50,000 units 1 and started 1000 units daily. Recommend repeating vitamin D level in next 4-6 weeks. Started on oral calcium 1000-50 mg twice a day. Also received additional doses of IV calcium gluconate on 2/7. No clinical manifestations of hypocalcemia. Corrected serum calcium: 7.8. Increased oral calcium supplements 2/8 pm. 7. Hypomagnesemia: Replaced 8. Anemia of chronic kidney disease/iron deficiency and recent GI bleed: RBC folate 1457, iron saturation 5%, B12: 1290. Continue iron sulfate. Status post Aranesp. Hemoglobin stable. 9. COPD/chronic hypoxic respiratory failure: As per patient, utilizes oxygen only at night or when necessary. Stable. 10. Chronic diastolic CHF: Compensated. 11. Essential hypertension: Reasonable inpatient control. Continue amlodipine, carvedilol and Imdur. ARB discontinued during last admission due to CKD 12. Former tobacco abuse: Quit 3 months ago. 13. Hyperlipidemia: Continue Lipitor and omega-3 fatty acids 14. Diet-controlled DM 2: A1c: 6.2 15. CAD:  Asymptomatic 70. Protein calorie malnutrition: Continue nutritional supplementations 17. Lewy body dementia without behavioral disturbance: Mental status at baseline 18. Sacral ulcer: Per [place] RN 19. Hypokalemia: Replace and follow.  DVT prophylaxis: SCDs Code Status: Full Family Communication: Discussed with spouse at bedside on 2/9 Disposition Plan: DC home when medically stable   Consultants:  Infectious disease  Procedures:  None  Antimicrobials:  IV cefepime 2/5 > 2/8  IV vancomycin 2/5 > 2/8  IV cefazolin 2/8 >   Subjective: Denies complaints. States that she feels much better than on admission. As per RN and spouse, no acute issues.  Objective: Filed Vitals:   04/01/15 1715 04/01/15 2112 04/02/15 0453 04/02/15 0754  BP: 140/51 118/52 142/59 132/69  Pulse: 69 67 84 75  Temp: 98 F (36.7 C) 98.7 F (37.1 C) 98.3 F (36.8 C) 98.1 F (36.7 C)  TempSrc: Oral Oral Oral Oral  Resp: 18 17 18 19   Height:      Weight:      SpO2: 96% 96% 93% 98%    Intake/Output Summary (Last 24 hours) at 04/02/15 1632 Last data filed at 04/02/15 1535  Gross per 24 hour  Intake    480 ml  Output      0 ml  Net    480 ml   Filed Weights   03/30/15 0500 03/31/15 0411 04/01/15 0433  Weight: 63.2 kg (139 lb 5.3 oz) 63.3 kg (139 lb 8.8 oz) 63.3 kg (139 lb 8.8 oz)    Exam:  General exam: Small built and frail pleasant middle-aged female lying comfortably supine in bed. Appears chronically ill-looking. Respiratory system: Clear. No increased work of breathing. Cardiovascular system: S1 & S2 heard, RRR. No JVD, murmurs, gallops, clicks or pedal edema.  Gastrointestinal system: Abdomen is nondistended, soft and nontender. Normal bowel sounds heard. Central nervous system: Alert and oriented. No focal neurological deficits. Extremities: Symmetric 5 x 5 power.   Data Reviewed: Basic Metabolic Panel:  Recent Labs Lab 03/29/15 1041 03/29/15 1043 03/29/15 1259  03/30/15 0604 03/31/15 0614 04/01/15 0523 04/02/15 0746  NA  --  138  --  139 142 142 142  K  --  3.6  --  3.3* 3.9 3.4* 3.7  CL  --  103  --  107 107 108 106  CO2  --  18*  --  19* 19* 22 21*  GLUCOSE  --  153*  --  94 84 97 91  BUN  --  55*  --  53* 49* 46* 45*  CREATININE  --  3.71*  --  3.53* 3.36* 3.10* 2.98*  CALCIUM  --  5.4*  --  5.3* 5.7* 6.0* 6.1*  MG 1.2*  --   --  1.8 1.7  --   --   PHOS  --   --  3.7 3.3  --   --  2.5   Liver Function Tests:  Recent Labs Lab 03/30/15 0604 04/02/15 0746  AST 17  --   ALT 11*  --   ALKPHOS 93  --   BILITOT 0.6  --   PROT 5.8*  --   ALBUMIN 1.8* 1.8*   No results for input(s): LIPASE, AMYLASE in the last 168 hours. No results for input(s): AMMONIA in the last 168 hours. CBC:  Recent Labs Lab 03/29/15 1043 03/30/15 0604 03/31/15 0614 04/02/15 0746  WBC 13.5* 10.2 8.9 11.9*  HGB 9.5* 9.0* 8.7* 9.6*  HCT 28.4* 26.7* 26.1* 29.2*  MCV 87.9 88.1 88.5 88.8  PLT 222 208 254 341   Cardiac Enzymes: No results for input(s): CKTOTAL, CKMB, CKMBINDEX, TROPONINI in the last 168 hours. BNP (last 3 results) No results for input(s):  PROBNP in the last 8760 hours. CBG: No results for input(s): GLUCAP in the last 168 hours.  Recent Results (from the past 240 hour(s))  Culture, blood (Routine X 2) w Reflex to ID Panel     Status: None (Preliminary result)   Collection Time: 03/29/15 12:40 PM  Result Value Ref Range Status   Specimen Description BLOOD RIGHT ANTECUBITAL  Final   Special Requests BOTTLES DRAWN AEROBIC AND ANAEROBIC 4MLS  Final   Culture NO GROWTH 4 DAYS  Final   Report Status PENDING  Incomplete  Culture, blood (Routine X 2) w Reflex to ID Panel     Status: None   Collection Time: 03/29/15 12:50 PM  Result Value Ref Range Status   Specimen Description BLOOD LEFT ANTECUBITAL  Final   Special Requests BOTTLES DRAWN AEROBIC AND ANAEROBIC 5CCS  Final   Culture  Setup Time   Final    GRAM POSITIVE COCCI IN  CLUSTERS ANAEROBIC BOTTLE ONLY CRITICAL RESULT CALLED TO, READ BACK BY AND VERIFIED WITH: ANITE,RN ON 6E @0437  03/31/15 MKELLY    Culture STAPHYLOCOCCUS AUREUS  Final   Report Status 04/01/2015 FINAL  Final   Organism ID, Bacteria STAPHYLOCOCCUS AUREUS  Final      Susceptibility   Staphylococcus aureus - MIC*    CIPROFLOXACIN <=0.5 SENSITIVE Sensitive     ERYTHROMYCIN <=0.25 SENSITIVE Sensitive     GENTAMICIN <=0.5 SENSITIVE Sensitive     OXACILLIN 0.5 SENSITIVE Sensitive     TETRACYCLINE <=1 SENSITIVE Sensitive     VANCOMYCIN <=0.5 SENSITIVE Sensitive     TRIMETH/SULFA <=10 SENSITIVE Sensitive     CLINDAMYCIN <=0.25 SENSITIVE Sensitive     RIFAMPIN <=0.5 SENSITIVE Sensitive     Inducible Clindamycin NEGATIVE Sensitive     * STAPHYLOCOCCUS AUREUS  Culture, blood (routine x 2)     Status: None (Preliminary result)   Collection Time: 03/31/15  7:20 PM  Result Value Ref Range Status   Specimen Description BLOOD LEFT ANTECUBITAL  Final   Special Requests BOTTLES DRAWN AEROBIC AND ANAEROBIC 5CC   Final   Culture NO GROWTH 2 DAYS  Final   Report Status PENDING  Incomplete  Culture, blood (routine x 2)     Status: None (Preliminary result)   Collection Time: 03/31/15  7:30 PM  Result Value Ref Range Status   Specimen Description BLOOD RIGHT ANTECUBITAL  Final   Special Requests BOTTLES DRAWN AEROBIC AND ANAEROBIC 5CC   Final   Culture NO GROWTH 2 DAYS  Final   Report Status PENDING  Incomplete         Studies: No results found.      Scheduled Meds: . amLODipine  5 mg Oral Daily  . atorvastatin  80 mg Oral q1800  . calcium carbonate  1,250 mg Oral TID WC  . carvedilol  3.125 mg Oral BID WC  .  ceFAZolin (ANCEF) IV  2 g Intravenous Q12H  . cholecalciferol  1,000 Units Oral Daily  . clopidogrel  75 mg Oral Daily  . collagenase   Topical Daily  . darbepoetin (ARANESP) injection - NON-DIALYSIS  40 mcg Subcutaneous Q Sun-1800  . famotidine  20 mg Oral Daily  . feeding  supplement (ENSURE ENLIVE)  237 mL Oral BID BM  . ferrous sulfate  325 mg Oral TID WC  . furosemide  20 mg Oral Daily  . guaiFENesin  600 mg Oral BID  . isosorbide mononitrate  30 mg Oral Daily  . mometasone-formoterol  1 puff Inhalation  Daily  . omega-3 acid ethyl esters  1 g Oral TID  . sertraline  50 mg Oral Daily  . sodium bicarbonate  650 mg Oral BID  . sodium chloride flush  3 mL Intravenous Q12H  . tamsulosin  0.4 mg Oral Daily   Continuous Infusions:   Principal Problem:   HCAP (healthcare-associated pneumonia) Active Problems:   Hyperlipidemia   Anemia, chronic disease   Chronic obstructive pulmonary disease (HCC)   Renal artery stenosis (HCC)   Peripheral vascular disease (HCC)   Upper GI bleed from jejunum, AVM vs. Dieaulafoy's lesion   Diet-controlled diabetes mellitus (HCC)   Iron deficiency anemia   On home oxygen therapy   CKD (chronic kidney disease), stage IV (HCC)   CAD (coronary artery disease)   Chronic diastolic CHF (congestive heart failure) ([place])   Essential hypertension   Former tobacco use   Protein-calorie malnutrition ([place])   Lewy body dementia without behavioral disturbance   Hypocalcemia   Sacral ulcer   Acute respiratory failure (HCC)   Hypomagnesemia   Low serum vitamin D   Metabolic acidosis   Malnutrition of moderate degree    Time spent: 25 minutes.    Vernell Leep, MD, FACP, FHM. [facility] Pager [phone]  If 7PM-7AM, please contact night-coverage [email or web address] Password [email or web address] 04/02/2015, 4:32 PM    LOS: 4 days

## 2015-04-02 NOTE — Progress Notes (Signed)
Physical Therapy Treatment Patient Details Name: [name] MRN: [id number] DOB: 27-Sep-1950 Today's Date: 04/02/2015    History of Present Illness 65 y.o. female past medical history that includes type 2 diabetes, hypertension, COPD, diastolic heart failure, on home oxygen, chronic kidney disease stage III, dementia. Presetns with generalized weakness and AMS. Pt with encephalopathy.    PT Comments    Limited progression today due to bowel incontinence (black tarry stool- RN notified.) The short distance that [name] was able to traverse did appear more stable with light use of RW for support. Will continue to follow and progress until d/c. [name]  Follow Up Recommendations  Supervision/Assistance - 24 hour;Home health PT     Equipment Recommendations  None recommended by PT    Recommendations for Other Services       Precautions / Restrictions Precautions Precautions: Fall Precaution Comments: monitor O2 with activity Restrictions Weight Bearing Restrictions: No    Mobility  Bed Mobility Overal bed mobility: Needs Assistance Bed Mobility: Supine to Sit;Sit to Supine     Supine to sit: Supervision Sit to supine: Supervision   General bed mobility comments: supervision for safety. Required extra time.  Transfers Overall transfer level: Needs assistance Equipment used: Rolling walker (2 wheeled) Transfers: Sit to/from Stand Sit to Stand: Min guard         General transfer comment: Min guard for safety. Performed from bed and BSC. VC for hand placement.  Ambulation/Gait Ambulation/Gait assistance: Min guard Ambulation Distance (Feet): 20 Feet Assistive device: Rolling walker (2 wheeled) Gait Pattern/deviations: Step-through pattern;Decreased stride length;Trunk flexed Gait velocity: slow Gait velocity interpretation: <1.8 ft/sec, indicative of risk for recurrent falls General Gait Details: Limited by bowel incontinence when patient reached the hallway. Overall  demonstrating good control of RW without overt loss of balance or shortness of breath.   Stairs            Wheelchair Mobility    Modified Rankin (Stroke Patients Only)       Balance           Standing balance support: No upper extremity supported Standing balance-Leahy Scale: Fair Standing balance comment: Stood without UE for short periods. Able to stand while performing peri-care with intermittent use of RW for support.                    Cognition Arousal/Alertness: Awake/alert Behavior During Therapy: Flat affect Overall Cognitive Status: No family/caregiver present to determine baseline cognitive functioning Area of Impairment: Memory;Following commands;Problem solving     Memory: Decreased short-term memory Following Commands: Follows multi-step commands inconsistently Safety/Judgement: Decreased awareness of safety Awareness: Intellectual Problem Solving: Slow processing;Difficulty sequencing      Exercises      General Comments General comments (skin integrity, edema, etc.): Patient with bowel incontinence while ambulating, unaware. Taken to bathroom. Rn notified. Black tarry BM foul odor. Assisted with hygiene      Pertinent Vitals/Pain Pain Assessment: Faces Faces Pain Scale: Hurts whole lot Pain Location: I just dont feel good. Pain Descriptors / Indicators:  ("don't feel good") Pain Intervention(s): Monitored during session;Repositioned    Home Living                      Prior Function            PT Goals (current goals can now be found in the care plan section) Acute Rehab PT Goals Patient Stated Goal: none stated PT Goal Formulation: With patient Time  For Goal Achievement: 04/14/15 Potential to Achieve Goals: Good Progress towards PT goals: Progressing toward goals    Frequency  Min 3X/week    PT Plan Current plan remains appropriate    Co-evaluation             End of Session Equipment Utilized During  Treatment: Gait belt Activity Tolerance: Other (comment) (Bowel incontinence) Patient left: with call bell/phone within reach;in bed;with bed alarm set     Time: 1031-1051 PT Time Calculation (min) (ACUTE ONLY): 20 min  Charges:  $Therapeutic Activity: 8-22 mins                    G Codes:      Ellouise Newer Apr 18, 2015, 12:15 PM  Camille Bal [place], [place]

## 2015-04-03 ENCOUNTER — Ambulatory Visit (HOSPITAL_COMMUNITY): Payer: Self-pay

## 2015-04-03 DIAGNOSIS — R7881 Bacteremia: Secondary | ICD-10-CM

## 2015-04-03 LAB — RENAL FUNCTION PANEL
ANION GAP: 16 — AB (ref 5–15)
Albumin: 1.6 g/dL — ABNORMAL LOW (ref 3.5–5.0)
BUN: 42 mg/dL — ABNORMAL HIGH (ref 6–20)
CALCIUM: 6.4 mg/dL — AB (ref 8.9–10.3)
CO2: 21 mmol/L — ABNORMAL LOW (ref 22–32)
CREATININE: 2.9 mg/dL — AB (ref 0.44–1.00)
Chloride: 110 mmol/L (ref 101–111)
GFR, EST AFRICAN AMERICAN: 19 mL/min — AB (ref >60)
GFR, EST NON AFRICAN AMERICAN: 16 mL/min — AB (ref >60)
Glucose, Bld: 60 mg/dL — ABNORMAL LOW (ref 65–99)
PHOSPHORUS: 3.5 mg/dL (ref 2.5–4.6)
Potassium: 4.1 mmol/L (ref 3.5–5.1)
SODIUM: 147 mmol/L — AB (ref 135–145)

## 2015-04-03 LAB — CULTURE, BLOOD (ROUTINE X 2): CULTURE: NO GROWTH

## 2015-04-03 LAB — PROCALCITONIN: PROCALCITONIN: 0.7 ng/mL

## 2015-04-03 LAB — GLUCOSE, CAPILLARY: GLUCOSE-CAPILLARY: 123 mg/dL — AB (ref 65–99)

## 2015-04-03 MED ORDER — NEPRO/CARBSTEADY PO LIQD: 237.0000 mL | Freq: Two times a day (BID) | ORAL | Status: DC

## 2015-04-03 MED ORDER — CALCIUM CARBONATE 1250 (500 CA) MG PO TABS
1.0000 | ORAL_TABLET | Freq: Three times a day (TID) | ORAL | Status: DC
  Administered 2015-04-04 – 2015-04-07 (×11): 500 mg via ORAL
  Filled 2015-04-03 (×11): qty 1

## 2015-04-03 NOTE — Progress Notes (Signed)
Nutrition Follow-up  DOCUMENTATION CODES:   Non-severe (moderate) malnutrition in context of chronic illness  INTERVENTION:  Discontinue Ensure.  Provide Nepro Shake po BID, each supplement provides 425 kcal and 19 grams protein.  Provide nourishment snacks in between meals.   Encourage adequate PO intake.   NUTRITION DIAGNOSIS:   Increased nutrient needs related to wound healing as evidenced by estimated needs; ongoing  GOAL:   Patient will meet greater than or equal to 90% of their needs; not met  MONITOR:   PO intake, Supplement acceptance, Weight trends, Labs, I & O's, Skin  REASON FOR ASSESSMENT:   Malnutrition Screening Tool    ASSESSMENT:   65 year old female with multiple medical problems including but not limited to CKD stage IV, COPD on home oxygen when necessary, chronic diastolic CHF, hypertension, recent GI bleed, CAD, hyperlipidemia, diet-controlled diabetes mellitus presented with 2-3 day history of cough, dyspnea on exertion, and subjective fevers.  Meal completion has been 10-50%. Pt reports appetite has been "so so". Pt is currently on a renal diet with 1200 ml fluids and reports no problems with her food at meals. Ensure has been ordered with varied intake. Pt is agreeable to trying alternative supplements. RD to order Nepro Shake instead. Pt requested snacks, such as dole fruit cups, and crackers between meals. RN to provide.   Labs: Low CO2, calcium, and GFR. High BUN, creatinine, and sodium.  Diet Order:  Diet renal with fluid restriction Fluid restriction:: 1200 mL Fluid; Room service appropriate?: Yes; Fluid consistency:: Thin  Skin:  Wound (see comment) (Stage III pressure ulcer on sacrum)  Last BM:  2/9  Height:   Ht Readings from Last 1 Encounters:  03/29/15 4' 10"  (1.473 m)    Weight:   Wt Readings from Last 1 Encounters:  04/02/15 139 lb 5.3 oz (63.2 kg)    Ideal Body Weight:  43.76 kg  BMI:  Body mass index is 29.13  kg/(m^2).  Estimated Nutritional Needs:   Kcal:  1600-1850  Protein:  75-85 grams  Fluid:  1.6 - 1.8 L/day  EDUCATION NEEDS:   No education needs identified at this time  Corrin Parker, MS, RD, LDN Pager # [phone] After hours/ weekend pager # [phone]

## 2015-04-03 NOTE — Progress Notes (Signed)
  Echocardiogram 2D Echocardiogram has been performed.  [name] 04/03/2015, 10:53 AM

## 2015-04-03 NOTE — Progress Notes (Signed)
Physical Therapy Treatment Patient Details Name: [name] MRN: [id number] DOB: 05/20/1950 Today's Date: 04/03/2015    History of Present Illness 65 y.o. female past medical history that includes type 2 diabetes, hypertension, COPD, diastolic heart failure, on home oxygen, chronic kidney disease stage III, dementia. Presetns with generalized weakness and AMS. Pt with encephalopathy.    PT Comments    Again, ambulatory distance and gait training limited by loose black bowel incontinence today. She was however able to complete stair training at a min guard level. Pt and family report they feel confident managing safely at home. She will benefit from HHPT to continue progression of strength, endurance, and independence with functional mobility. Will continue to follow and progress until d/c.  Follow Up Recommendations  Supervision/Assistance - 24 hour;Home health PT     Equipment Recommendations  None recommended by PT    Recommendations for Other Services       Precautions / Restrictions Precautions Precautions: Fall Precaution Comments: monitor O2 with activity Restrictions Weight Bearing Restrictions: No    Mobility  Bed Mobility Overal bed mobility: Needs Assistance Bed Mobility: Supine to Sit     Supine to sit: Supervision     General bed mobility comments: Extra time and minimal use of rail to rise to EOB.  Transfers Overall transfer level: Needs assistance Equipment used: Rolling walker (2 wheeled) Transfers: Sit to/from Stand Sit to Stand: Supervision         General transfer comment: supervision for safety. VC for hand placement. Performed from bed and recliner x2.  Ambulation/Gait Ambulation/Gait assistance: Supervision Ambulation Distance (Feet): 20 Feet Assistive device: Rolling walker (2 wheeled) Gait Pattern/deviations: Step-through pattern;Decreased stride length;Trunk flexed Gait velocity: slow Gait velocity interpretation: <1.8 ft/sec,  indicative of risk for recurrent falls General Gait Details: Again, limited by bowel incontinence with short distance. Cues for upright posture and walker placement with turns. No overt loss of balance or buckling noted.   Stairs Stairs: Yes Stairs assistance: Min guard Stair Management: One rail Left;Sideways;Step to pattern Number of Stairs: 2 General stair comments: Performed at a min guard level for safety today. VC for sequencing. Effortful but capable of performing without physical assist this date.  Wheelchair Mobility    Modified Rankin (Stroke Patients Only)       Balance                                    Cognition Arousal/Alertness: Awake/alert Behavior During Therapy: Flat affect Overall Cognitive Status: No family/caregiver present to determine baseline cognitive functioning Area of Impairment: Memory;Following commands;Problem solving     Memory: Decreased short-term memory Following Commands: Follows multi-step commands inconsistently Safety/Judgement: Decreased awareness of safety   Problem Solving: Slow processing;Difficulty sequencing      Exercises      General Comments General comments (skin integrity, edema, etc.): Family present. Both agree they can manage at home.      Pertinent Vitals/Pain Pain Assessment: Faces Faces Pain Scale: Hurts even more Pain Location: sacral area Pain Descriptors / Indicators: Sore Pain Intervention(s): Monitored during session;Repositioned    Home Living                      Prior Function            PT Goals (current goals can now be found in the care plan section) Acute Rehab PT Goals Patient Stated Goal: none  stated PT Goal Formulation: With patient Time For Goal Achievement: 04/14/15 Potential to Achieve Goals: Good Progress towards PT goals: Progressing toward goals    Frequency       PT Plan Current plan remains appropriate    Co-evaluation             End of  Session Equipment Utilized During Treatment: Gait belt Activity Tolerance: Other (comment) (Bowel incontinence) Patient left: in chair;with call bell/phone within reach;with chair alarm set;with family/visitor present     Time: [id number] PT Time Calculation (min) (ACUTE ONLY): 28 min  Charges:  $Gait Training: 8-22 mins $Therapeutic Activity: 8-22 mins                    G Codes:      Ellouise Newer 05/01/2015, 3:56 PM Camille Bal [place], [place]

## 2015-04-03 NOTE — Progress Notes (Signed)
PROGRESS NOTE    [name] [id number] DOB: 1950/11/27 DOA: 03/29/2015 PCP: Pcp Not In System  HPI/Brief narrative 65 year old female with multiple medical problems including but not limited to CKD stage IV, COPD on home oxygen when necessary, chronic diastolic CHF, hypertension, recent GI bleed, CAD, hyperlipidemia, diet-controlled diabetes mellitus presented with 2-3 day history of cough, dyspnea on exertion, and subjective fevers. Notably, the patient was recently discharged from the hospital on 03/26/2015 after treatment for upper GI bleed. She underwent endoscopy (Dr. Wilfrid Lund III) which did not show evidence of active bleeding but there was a suspected distal intestinal ulcer or AVM. She was also seen by cardiology secondary to chest discomfort and elevated troponins. The patient had a Myoview on 01/22/2015 with intermediate risk, EF 52% They did not feel that she was a candidate for open heart surgery or cardiac catheterization or percutaneous intervention given her bleeding risk and renal failure. She was to be managed medically from a cardiac standpoint. Cardiology felt that she could be continued with clopidogrel monotherapy after discharge. At the time of this admission, the patient was noted to be hypocalcemic with a corrected calcium of 6.8. The patient was recently treated for hypercalcemia with intravenous pamidronate which was thought to be due to dehydration and calcium supplementation (overuse of Tums).   Assessment/Plan:   1. Healthcare associated pneumonia: Treated empirically with IV cefepime and vancomycin. CT chest without contrast: Showed bilateral nodular masslike opacities right >left with mediastinal lymphadenopathy. Treat for likely infectious etiology and will need outpatient surveillance CT after pneumonia has been treated. If lesions persist, will need further evaluation with pulmonary consultation and possible bronchoscopy. ID input appreciated. Given  Staphaureus bacteremia, pneumonia may be same. Antibiotics transitioned to IV cefazolin. 2. Acute on chronic respiratory failure with hypoxia: Likely secondary to pneumonia. Improving. 3. MSSA bacteremia: One of 2 blood cultures positive on admission. ID input appreciated. Surveillance blood cultures negative to date. Changed to IV Ancef. TTE does not show vegetations. Await ID follow-up regarding duration of antibiotics. Hesitant re PICC line give advanced chronic kidney disease. 4. Acute on stage III-IV chronic kidney disease/renal artery stenosis: Baseline creatinine 3-3 0.3. Metabolic acidosis has improved. Renal function back to baseline. Outpatient follow-up with Dr. Florene Glen, nephrology. 5. Recent upper GI bleed: Has persistent dark stools which may be from iron supplementation. Hemoglobin stable. Ferritin 360. 6. Hypocalcemia/low vitamin D/recent hypercalcemia: Vitamin D level on 1/13 < 5 and repeat vitamin D level on 1/27 after supplementation was 7.2. Received vitamin D 50,000 units 1 and started 1000 units daily. Recommend repeating vitamin D level in next 4-6 weeks. 2 admissions ago, patient presented with hypercalcemia related to overuse of Tums, dehydration and acute renal failure. At that time she received calcitonin and IV pamidronate. During subsequent admission, she was hypocalcemic and started on calcium supplements. No clinical manifestations of hypocalcemia. Corrected serum calcium starting to rise again and due to prior concern for hypercalcemia, discussed with nephrologist on call and since she is asymptomatic, we will reduce her calcium supplements to 500 MG 3 times a day. Outpatient follow-up with nephrology regarding initiating calcitriol. 7. Hypomagnesemia: Replaced 8. Anemia of chronic kidney disease/iron deficiency and recent GI bleed: RBC folate 1457, iron saturation 5%, B12: 1290. Continue iron sulfate. Status post Aranesp. Hemoglobin stable. 9. COPD/chronic hypoxic respiratory  failure: As per patient, utilizes oxygen only at night or when necessary. Stable. 10. Chronic diastolic CHF: Compensated. 11. Essential hypertension: Reasonable inpatient control. Continue amlodipine, carvedilol and Imdur. ARB  discontinued during last admission due to CKD 12. Former tobacco abuse: Quit 3 months ago. 13. Hyperlipidemia: Continue Lipitor and omega-3 fatty acids 14. Diet-controlled DM 2: A1c: 6.2 15. CAD: Asymptomatic 65. Protein calorie malnutrition: Continue nutritional supplementations 17. Lewy body dementia without behavioral disturbance: Mental status at baseline 18. Sacral ulcer: Per [place] RN 19. Hypokalemia: Replace and follow. 20. Adult failure to thrive: Patient has had 6 admissions in the last 6 months. Multifactorial due to severe comorbidities and age. We will get palliative care consultation for goals of care.   DVT prophylaxis: SCDs Code Status: Full Family Communication: Discussed with spouse at bedside on 2/10 Disposition Plan: DC home when medically stable   Consultants:  Infectious disease  Procedures:  None  Antimicrobials:  IV cefepime 2/5 > 2/8  IV vancomycin 2/5 > 2/8  IV cefazolin 2/8 >   Subjective: Denies complaints. States that her breathing is back to baseline.  Objective: Filed Vitals:   04/03/15 0636 04/03/15 0755 04/03/15 0919 04/03/15 1602  BP: 105/63 151/50  125/49  Pulse: 70 75  75  Temp: 98.6 F (37 C) 98.6 F (37 C)  98.7 F (37.1 C)  TempSrc: Oral Oral  Oral  Resp: 18 17  18   Height:      Weight:      SpO2: 100% 96% 96% 96%    Intake/Output Summary (Last 24 hours) at 04/03/15 1725 Last data filed at 04/03/15 1645  Gross per 24 hour  Intake   1163 ml  Output    852 ml  Net    311 ml   Filed Weights   03/31/15 0411 04/01/15 0433 04/02/15 2041  Weight: 63.3 kg (139 lb 8.8 oz) 63.3 kg (139 lb 8.8 oz) 63.2 kg (139 lb 5.3 oz)    Exam:  General exam: Small built and frail pleasant middle-aged female sitting  up comfortably at edge of bed this morning. Appears chronically ill-looking. Respiratory system: Clear. No increased work of breathing. Cardiovascular system: S1 & S2 heard, RRR. No JVD, murmurs, gallops, clicks or pedal edema.  Gastrointestinal system: Abdomen is nondistended, soft and nontender. Normal bowel sounds heard. Central nervous system: Alert and oriented. No focal neurological deficits. Extremities: Symmetric 5 x 5 power.   Data Reviewed: Basic Metabolic Panel:  Recent Labs Lab 03/29/15 1041  03/29/15 1259 03/30/15 0604 03/31/15 [id number] 04/01/15 0523 04/02/15 0746 04/03/15 0607  NA  --   < >  --  139 142 142 142 147*  K  --   < >  --  3.3* 3.9 3.4* 3.7 4.1  CL  --   < >  --  107 107 108 106 110  CO2  --   < >  --  19* 19* 22 21* 21*  GLUCOSE  --   < >  --  94 84 97 91 60*  BUN  --   < >  --  53* 49* 46* 45* 42*  CREATININE  --   < >  --  3.53* 3.36* 3.10* 2.98* 2.90*  CALCIUM  --   < >  --  5.3* 5.7* 6.0* 6.1* 6.4*  MG 1.2*  --   --  1.8 1.7  --   --   --   PHOS  --   --  3.7 3.3  --   --  2.5 3.5  < > = values in this interval not displayed. Liver Function Tests:  Recent Labs Lab 03/30/15 0604 04/02/15 0746 04/03/15 0607  AST  17  --   --   ALT 11*  --   --   ALKPHOS 93  --   --   BILITOT 0.6  --   --   PROT 5.8*  --   --   ALBUMIN 1.8* 1.8* 1.6*   No results for input(s): LIPASE, AMYLASE in the last 168 hours. No results for input(s): AMMONIA in the last 168 hours. CBC:  Recent Labs Lab 03/29/15 1043 03/30/15 0604 03/31/15 0614 04/02/15 0746  WBC 13.5* 10.2 8.9 11.9*  HGB 9.5* 9.0* 8.7* 9.6*  HCT 28.4* 26.7* 26.1* 29.2*  MCV 87.9 88.1 88.5 88.8  PLT 222 208 254 341   Cardiac Enzymes: No results for input(s): CKTOTAL, CKMB, CKMBINDEX, TROPONINI in the last 168 hours. BNP (last 3 results) No results for input(s): PROBNP in the last 8760 hours. CBG: No results for input(s): GLUCAP in the last 168 hours.  Recent Results (from the past 240  hour(s))  Culture, blood (Routine X 2) w Reflex to ID Panel     Status: None   Collection Time: 03/29/15 12:40 PM  Result Value Ref Range Status   Specimen Description BLOOD RIGHT ANTECUBITAL  Final   Special Requests BOTTLES DRAWN AEROBIC AND ANAEROBIC 4MLS  Final   Culture NO GROWTH 5 DAYS  Final   Report Status 04/03/2015 FINAL  Final  Culture, blood (Routine X 2) w Reflex to ID Panel     Status: None   Collection Time: 03/29/15 12:50 PM  Result Value Ref Range Status   Specimen Description BLOOD LEFT ANTECUBITAL  Final   Special Requests BOTTLES DRAWN AEROBIC AND ANAEROBIC 5CCS  Final   Culture  Setup Time   Final    GRAM POSITIVE COCCI IN CLUSTERS ANAEROBIC BOTTLE ONLY CRITICAL RESULT CALLED TO, READ BACK BY AND VERIFIED WITH: ANITE,RN ON 6E @0437  03/31/15 MKELLY    Culture STAPHYLOCOCCUS AUREUS  Final   Report Status 04/01/2015 FINAL  Final   Organism ID, Bacteria STAPHYLOCOCCUS AUREUS  Final      Susceptibility   Staphylococcus aureus - MIC*    CIPROFLOXACIN <=0.5 SENSITIVE Sensitive     ERYTHROMYCIN <=0.25 SENSITIVE Sensitive     GENTAMICIN <=0.5 SENSITIVE Sensitive     OXACILLIN 0.5 SENSITIVE Sensitive     TETRACYCLINE <=1 SENSITIVE Sensitive     VANCOMYCIN <=0.5 SENSITIVE Sensitive     TRIMETH/SULFA <=10 SENSITIVE Sensitive     CLINDAMYCIN <=0.25 SENSITIVE Sensitive     RIFAMPIN <=0.5 SENSITIVE Sensitive     Inducible Clindamycin NEGATIVE Sensitive     * STAPHYLOCOCCUS AUREUS  Culture, blood (routine x 2)     Status: None (Preliminary result)   Collection Time: 03/31/15  7:20 PM  Result Value Ref Range Status   Specimen Description BLOOD LEFT ANTECUBITAL  Final   Special Requests BOTTLES DRAWN AEROBIC AND ANAEROBIC 5CC   Final   Culture NO GROWTH 3 DAYS  Final   Report Status PENDING  Incomplete  Culture, blood (routine x 2)     Status: None (Preliminary result)   Collection Time: 03/31/15  7:30 PM  Result Value Ref Range Status   Specimen Description BLOOD RIGHT  ANTECUBITAL  Final   Special Requests BOTTLES DRAWN AEROBIC AND ANAEROBIC 5CC   Final   Culture NO GROWTH 3 DAYS  Final   Report Status PENDING  Incomplete         Studies: No results found.      Scheduled Meds: . amLODipine  5 mg  Oral Daily  . atorvastatin  80 mg Oral q1800  . [START ON 04/04/2015] calcium carbonate  1 tablet Oral TID WC  . carvedilol  3.125 mg Oral BID WC  .  ceFAZolin (ANCEF) IV  2 g Intravenous Q12H  . cholecalciferol  1,000 Units Oral Daily  . clopidogrel  75 mg Oral Daily  . collagenase   Topical Daily  . darbepoetin (ARANESP) injection - NON-DIALYSIS  40 mcg Subcutaneous Q Sun-1800  . famotidine  20 mg Oral Daily  . feeding supplement (NEPRO CARB STEADY)  237 mL Oral BID BM  . ferrous sulfate  325 mg Oral TID WC  . furosemide  20 mg Oral Daily  . guaiFENesin  600 mg Oral BID  . isosorbide mononitrate  30 mg Oral Daily  . mometasone-formoterol  1 puff Inhalation Daily  . omega-3 acid ethyl esters  1 g Oral TID  . sertraline  50 mg Oral Daily  . sodium bicarbonate  650 mg Oral BID  . sodium chloride flush  3 mL Intravenous Q12H  . tamsulosin  0.4 mg Oral Daily   Continuous Infusions:   Principal Problem:   HCAP (healthcare-associated pneumonia) Active Problems:   Hyperlipidemia   Anemia, chronic disease   Chronic obstructive pulmonary disease (HCC)   Renal artery stenosis (HCC)   Peripheral vascular disease (HCC)   Upper GI bleed from jejunum, AVM vs. Dieaulafoy's lesion   Diet-controlled diabetes mellitus (HCC)   Iron deficiency anemia   On home oxygen therapy   CKD (chronic kidney disease), stage IV (HCC)   CAD (coronary artery disease)   Chronic diastolic CHF (congestive heart failure) ([place])   Essential hypertension   Former tobacco use   Protein-calorie malnutrition ([place])   Lewy body dementia without behavioral disturbance   Hypocalcemia   Sacral ulcer   Acute respiratory failure (HCC)   Hypomagnesemia   Low serum vitamin D    Metabolic acidosis   Malnutrition of moderate degree    Time spent: 25 minutes.    Vernell Leep, MD, FACP, FHM. [facility] Pager [phone]  If 7PM-7AM, please contact night-coverage [email or web address] Password [email or web address] 04/03/2015, 5:25 PM    LOS: 5 days

## 2015-04-03 NOTE — Progress Notes (Signed)
CRITICAL VALUE ALERT  Critical value received:  Calcium 6.4 Date of notification: April 03, 2015  Time of notification: [id number] Critical value read back: yes  Nurse who received alert:  Manya Silvas, RN  MD notified (1st page): Dr. Algis Liming Time of first page: [phone]

## 2015-04-04 ENCOUNTER — Inpatient Hospital Stay (HOSPITAL_COMMUNITY): Payer: Self-pay

## 2015-04-04 DIAGNOSIS — D638 Anemia in other chronic diseases classified elsewhere: Secondary | ICD-10-CM

## 2015-04-04 DIAGNOSIS — I25110 Atherosclerotic heart disease of native coronary artery with unstable angina pectoris: Secondary | ICD-10-CM

## 2015-04-04 DIAGNOSIS — I5032 Chronic diastolic (congestive) heart failure: Secondary | ICD-10-CM

## 2015-04-04 DIAGNOSIS — N184 Chronic kidney disease, stage 4 (severe): Secondary | ICD-10-CM

## 2015-04-04 DIAGNOSIS — D72829 Elevated white blood cell count, unspecified: Secondary | ICD-10-CM

## 2015-04-04 DIAGNOSIS — E119 Type 2 diabetes mellitus without complications: Secondary | ICD-10-CM

## 2015-04-04 LAB — CBC WITH DIFFERENTIAL/PLATELET
BASOS PCT: 0 %
Basophils Absolute: 0 10*3/uL (ref 0.0–0.1)
EOS ABS: 0 10*3/uL (ref 0.0–0.7)
EOS PCT: 0 %
HCT: 27.4 % — ABNORMAL LOW (ref 36.0–46.0)
Hemoglobin: 9 g/dL — ABNORMAL LOW (ref 12.0–15.0)
LYMPHS PCT: 6 %
Lymphs Abs: 0.6 10*3/uL — ABNORMAL LOW (ref 0.7–4.0)
MCH: 29.2 pg (ref 26.0–34.0)
MCHC: 32.8 g/dL (ref 30.0–36.0)
MCV: 89 fL (ref 78.0–100.0)
MONOS PCT: 6 %
Monocytes Absolute: 0.6 10*3/uL (ref 0.1–1.0)
NEUTROS ABS: 9 10*3/uL — AB (ref 1.7–7.7)
Neutrophils Relative %: 88 %
PLATELETS: 337 10*3/uL (ref 150–400)
RBC: 3.08 MIL/uL — AB (ref 3.87–5.11)
RDW: 16.2 % — ABNORMAL HIGH (ref 11.5–15.5)
WBC: 10.2 10*3/uL (ref 4.0–10.5)

## 2015-04-04 LAB — BASIC METABOLIC PANEL
Anion gap: 12 (ref 5–15)
BUN: 36 mg/dL — AB (ref 6–20)
CALCIUM: 6.2 mg/dL — AB (ref 8.9–10.3)
CO2: 21 mmol/L — AB (ref 22–32)
CREATININE: 2.68 mg/dL — AB (ref 0.44–1.00)
Chloride: 108 mmol/L (ref 101–111)
GFR calc non Af Amer: 18 mL/min — ABNORMAL LOW (ref >60)
GFR, EST AFRICAN AMERICAN: 20 mL/min — AB (ref >60)
Glucose, Bld: 87 mg/dL (ref 65–99)
Potassium: 2.8 mmol/L — ABNORMAL LOW (ref 3.5–5.1)
SODIUM: 141 mmol/L (ref 135–145)

## 2015-04-04 LAB — GLUCOSE, CAPILLARY
GLUCOSE-CAPILLARY: 111 mg/dL — AB (ref 65–99)
GLUCOSE-CAPILLARY: 114 mg/dL — AB (ref 65–99)
GLUCOSE-CAPILLARY: 92 mg/dL (ref 65–99)
GLUCOSE-CAPILLARY: 96 mg/dL (ref 65–99)

## 2015-04-04 LAB — MAGNESIUM: MAGNESIUM: 1.4 mg/dL — AB (ref 1.7–2.4)

## 2015-04-04 MED ORDER — ONDANSETRON HCL 4 MG/2ML IJ SOLN
4.0000 mg | Freq: Four times a day (QID) | INTRAMUSCULAR | Status: DC | PRN
  Administered 2015-04-04: 4 mg via INTRAVENOUS
  Filled 2015-04-04: qty 2

## 2015-04-04 MED ORDER — POTASSIUM CHLORIDE 10 MEQ/100ML IV SOLN
10.0000 meq | INTRAVENOUS | Status: AC
  Administered 2015-04-04 (×4): 10 meq via INTRAVENOUS
  Filled 2015-04-04 (×4): qty 100

## 2015-04-04 NOTE — Progress Notes (Addendum)
[facility] for Infectious Disease    Date of Admission:  03/29/2015   Total days of antibiotics 7        Day 4 cefazolin           ID: [name] is a 65 y.o. female with HCAP, and staph aureus bacteremia Principal Problem:   HCAP (healthcare-associated pneumonia) Active Problems:   Hyperlipidemia   Anemia, chronic disease   Chronic obstructive pulmonary disease (HCC)   Renal artery stenosis (HCC)   Peripheral vascular disease ([place])   Upper GI bleed from jejunum, AVM vs. Dieaulafoy's lesion   Diet-controlled diabetes mellitus ([place])   Iron deficiency anemia   On home oxygen therapy   CKD (chronic kidney disease), stage IV (HCC)   CAD (coronary artery disease)   Chronic diastolic CHF (congestive heart failure) ([place])   Essential hypertension   Former tobacco use   Protein-calorie malnutrition ([place])   Lewy body dementia without behavioral disturbance   Hypocalcemia   Sacral ulcer   Acute respiratory failure (HCC)   Hypomagnesemia   Low serum vitamin D   Metabolic acidosis   Malnutrition of moderate degree    Subjective: Feeling poorly still having shortness of breath and productive cough  Medications:  . amLODipine  5 mg Oral Daily  . atorvastatin  80 mg Oral q1800  . calcium carbonate  1 tablet Oral TID WC  . carvedilol  3.125 mg Oral BID WC  .  ceFAZolin (ANCEF) IV  2 g Intravenous Q12H  . cholecalciferol  1,000 Units Oral Daily  . clopidogrel  75 mg Oral Daily  . collagenase   Topical Daily  . darbepoetin (ARANESP) injection - NON-DIALYSIS  40 mcg Subcutaneous Q Sun-1800  . famotidine  20 mg Oral Daily  . feeding supplement (NEPRO CARB STEADY)  237 mL Oral BID BM  . ferrous sulfate  325 mg Oral TID WC  . furosemide  20 mg Oral Daily  . guaiFENesin  600 mg Oral BID  . isosorbide mononitrate  30 mg Oral Daily  . mometasone-formoterol  1 puff Inhalation Daily  . omega-3 acid ethyl esters  1 g Oral TID  . sertraline  50 mg Oral Daily  . sodium  bicarbonate  650 mg Oral BID  . sodium chloride flush  3 mL Intravenous Q12H  . tamsulosin  0.4 mg Oral Daily    Objective: Vital signs in last 24 hours: Temp:  [98 F (36.7 C)-98.7 F (37.1 C)] 98.6 F (37 C) (02/11 0805) Pulse Rate:  [58-78] 78 (02/11 0805) Resp:  [18-20] 19 (02/11 0805) BP: (125-151)/(40-68) 151/40 mmHg (02/11 0805) SpO2:  [94 %-99 %] 94 % (02/11 0805) Weight:  [139 lb 12.4 oz (63.4 kg)] 139 lb 12.4 oz (63.4 kg) (02/10 2045) Physical Exam  Constitutional:  oriented to person, place, and time. Older than stated age, frail appearing. No distress.  HENT: [place]/AT, PERRLA, no scleral icterus Mouth/Throat: Oropharynx is clear and moist. No oropharyngeal exudate.  Cardiovascular: Normal rate, regular rhythm and normal heart sounds. Exam reveals no gallop and no friction rub.  No murmur heard.  Pulmonary/Chest: Effort normal and breath sounds normal. Expiratory rhonchi heard bilaterally at bases Neck = supple, no nuchal rigidity Abdominal: Soft. Bowel sounds are normal.  exhibits no distension. There is no tenderness.  Lymphadenopathy: no cervical adenopathy. No axillary adenopathy Neurological: alert and oriented to person, place, and time.  Skin: Skin is warm and dry. No rash noted. No erythema.  Psychiatric: a  normal mood and affect.  behavior is normal.   Lab Results  Recent Labs  04/02/15 0746 04/03/15 0607 04/04/15 0440  WBC 11.9*  --   --   HGB 9.6*  --   --   HCT 29.2*  --   --   NA 142 147* 141  K 3.7 4.1 2.8*  CL 106 110 108  CO2 21* 21* 21*  BUN 45* 42* 36*  CREATININE 2.98* 2.90* 2.68*   Liver Panel  Recent Labs  04/02/15 0746 04/03/15 0607  ALBUMIN 1.8* 1.6*    Microbiology: 2/5 blood cx 1 of 4 bottles MSSA 2/7 blood cx NGTD Studies/Results: Chest CT: IMPRESSION: 1. Multiple nodular and mass-like areas of apparent airspace consolidation, asymmetrically distributed in the lungs bilaterally (right greater than left), which have an  unusual appearance concerning for atypical infection. In particular, findings are concerning for potential fungal infection such as angioinvasive aspergillosis. 2. Associated with this there is mediastinal lymphadenopathy, as above. Although this could be reactive, the extensive lymphadenopathy is somewhat atypical for reactive lymphadenopathy, and accordingly, clinical correlation for signs and symptoms of lymphoproliferative disorder is recommended. 3. Atherosclerosis, including left main and 3 vessel coronary artery disease. Please note that although the presence of coronary artery calcium documents the presence of coronary artery disease, the severity of this disease and any potential stenosis cannot be assessed on this non-gated CT examination. Assessment for potential risk factor modification, dietary therapy or pharmacologic therapy may be warranted, if clinically indicated . 2/10 TTE : no vegetation Study Conclusions  - Left ventricle: The cavity size was normal. Wall thickness was normal. Systolic function was normal. The estimated ejection fraction was in the range of 55% to 60%. Wall motion was normal; there were no regional wall motion abnormalities. Features are consistent with a pseudonormal left ventricular filling pattern, with concomitant abnormal relaxation and increased filling pressure (grade 2 diastolic dysfunction). - Mitral valve: Calcified annulus. Mildly thickened leaflets . Mild thickening of the anterior leaflet and posterior leaflet, involving the leaflet margin more than the base, possibly a sign of previous rheumatic disease. Mobility of the posterior leaflet was mildly restricted. Diastolic leaflet doming was present. There was moderate regurgitation directed centrally. - Left atrium: The atrium was mildly dilated. - Pulmonary arteries: Systolic pressure was moderately increased. PA peak pressure: 62 mm Hg (S). - Pericardium,  extracardiac: A trivial pericardial effusion was identified.  Assessment/Plan: HCAP with staph aureus bacteremia= currently on day 7 of IV abtx without much improvement in symptoms. Her CT is abit unusual findings, concerning for septic emboli possibly?, could this represent malignancy vs. Fungal infection.  Will recommend that she has minimum of 2 wk of cefazolin via picc line. Given her symptoms still persist, I will repeat CXR today. Please check with IR if it would be easy to access any affected areas of right lung for FNA to send for fungal cx, cytology.  Staph aureus bacteremia = given that she was ill on presentation it is difficult to say that this was contaminant, i suspect that she may have pulmonary process with secondary bacteremia.   CKD = appears improving with cr 2.6 today  Leukocytosis = will repeat cbc with diff today since it appears it was trending upward     [facility], [facility] Cell: [phone] Pager: [phone]  04/04/2015, 12:16 PM

## 2015-04-04 NOTE — Progress Notes (Signed)
[facility]                                                                              Patient Demographics  [name], is a 65 y.o. female, DOB - [birth year], [id number]  Admit date - 03/29/2015   Admitting Physician Costin Karlyne Greenspan, MD  Outpatient Primary MD for the patient is Pcp Not In System  LOS - 6   Chief Complaint  Patient presents with  . Shortness of Breath       Brief HPI   65 year old female with multiple medical problems including but not limited to CKD stage IV, COPD on home oxygen when necessary, chronic diastolic CHF, hypertension, recent GI bleed, CAD, hyperlipidemia, diet-controlled diabetes mellitus presented with 2-3 day history of cough, dyspnea on exertion, and subjective fevers. Notably, the patient was recently discharged from the hospital on 03/26/2015 after treatment for upper GI bleed. She underwent endoscopy (Dr. Wilfrid Lund III) which did not show evidence of active bleeding but there was a suspected distal intestinal ulcer or AVM. She was also seen by cardiology secondary to chest discomfort and elevated troponins. The patient had a Myoview on 01/22/2015 with intermediate risk, EF 52% They did not feel that she was a candidate for open heart surgery or cardiac catheterization or percutaneous intervention given her bleeding risk and renal failure. She was to be managed medically from a cardiac standpoint. Cardiology felt that she could be continued with clopidogrel monotherapy after discharge. At the time of this admission, the patient was noted to be hypocalcemic with a corrected calcium of 6.8. The patient was recently treated for hypercalcemia with intravenous pamidronate which was thought to be due to dehydration and calcium supplementation (overuse of Tums).   Assessment & Plan    1. Acute on chronic hypoxic respiratory failure/ Healthcare associated pneumonia, underlying COPD: - patient was treated with IV vancomycin and  cefepime, now changed to cefazolin for MSSA bacteremia. - CT chest without contrast: Showed bilateral nodular masslike opacities right >left with mediastinal lymphadenopathy. Outpatient follow-up CT after the pneumonia has been treated.   2. MSSA bacteremia:  - 1/ 2 blood cultures positive on admission, ID consult obtained, antibiotics changed to IV cefazolin - 2-D echo did not show vegetations. Need TEE? - Await ID follow-up regarding duration of antibiotics, will need CV midline PICC by IR with CKD  3. Acute on stage III-IV chronic kidney disease/renal artery stenosis:  - Baseline creatinine 3-3 0.3. Metabolic acidosis has improved. Renal function back to baseline. Outpatient follow-up with Dr. Florene Glen, nephrology.  4. Recent upper GI bleed: Has persistent dark stools which may be from iron supplementation.  - H/H. Ferritin 360. - patient also has anemia of chronic kidney disease/iron deficiency, status post Aranesp, continue iron supplementation   5. Hypokalemia - Potassium 2.8, obtain magnesium level, replaced IV  6. Hypocalcemia/low vitamin D/recent hypercalcemia: Vitamin D level on 1/13 < 5 and repeat vitamin D level on 1/27 after supplementation was 7.2. Received vitamin D 50,000 units 1 and started 1000 units daily. Recommend repeating vitamin D level in next 4-6 weeks. 2 admissions ago, patient presented with hypercalcemia related to  overuse of Tums, dehydration and acute renal failure. At that time she received calcitonin and IV pamidronate. During subsequent admission, she was hypocalcemic and started on calcium supplements. No clinical manifestations of hypocalcemia. Corrected serum calcium starting to rise again and due to prior concern for hypercalcemia, discussed with nephrologist on call and since she is asymptomatic, we will reduce her calcium supplements to 500 MG 3 times a day. Outpatient follow-up with nephrology regarding initiating calcitriol.  7. Chronic diastolic CHF:  Currently Compensated.  8. Essential hypertension:  -  Continue amlodipine, carvedilol and Imdur. ARB discontinued during last admission due to CKD  9. Former tobacco abuse: Quit 3 months ago.  10. Hyperlipidemia: Continue Lipitor and omega-3 fatty acids  11. Diet-controlled DM 2: A1c: 6.2  12. CAD: Asymptomatic  52. Protein calorie malnutrition: Continue nutritional supplementations  14. Lewy body dementia without behavioral disturbance: Mental status at baseline  15. Sacral ulcer: - wound care consulted  16. Adult failure to thrive: Patient has had 6 admissions in the last 6 months. Multifactorial due to severe comorbidities and age, will benefit from palliative care consult for goals of care  Code Status: Full CODE STATUS  Family Communication: Discussed in detail with the patient, all imaging results, lab results explained to the patient   Disposition Plan  Time Spent in minutes  25 minutes  Procedures    Consults   ID  DVT Prophylaxis  SCD's   Medications  Scheduled Meds: . amLODipine  5 mg Oral Daily  . atorvastatin  80 mg Oral q1800  . calcium carbonate  1 tablet Oral TID WC  . carvedilol  3.125 mg Oral BID WC  .  ceFAZolin (ANCEF) IV  2 g Intravenous Q12H  . cholecalciferol  1,000 Units Oral Daily  . clopidogrel  75 mg Oral Daily  . collagenase   Topical Daily  . darbepoetin (ARANESP) injection - NON-DIALYSIS  40 mcg Subcutaneous Q Sun-1800  . famotidine  20 mg Oral Daily  . feeding supplement (NEPRO CARB STEADY)  237 mL Oral BID BM  . ferrous sulfate  325 mg Oral TID WC  . furosemide  20 mg Oral Daily  . guaiFENesin  600 mg Oral BID  . isosorbide mononitrate  30 mg Oral Daily  . mometasone-formoterol  1 puff Inhalation Daily  . omega-3 acid ethyl esters  1 g Oral TID  . sertraline  50 mg Oral Daily  . sodium bicarbonate  650 mg Oral BID  . sodium chloride flush  3 mL Intravenous Q12H  . tamsulosin  0.4 mg Oral Daily   Continuous Infusions:  PRN  Meds:.acetaminophen **OR** [DISCONTINUED] acetaminophen, albuterol, benzonatate, gi cocktail, ondansetron **OR** [DISCONTINUED] ondansetron (ZOFRAN) IV   Antibiotics   Anti-infectives    Start     Dose/Rate Route Frequency Ordered Stop   04/01/15 2200  ceFAZolin (ANCEF) IVPB 2 g/50 mL premix     2 g 100 mL/hr over 30 Minutes Intravenous Every 12 hours 04/01/15 2151     03/31/15 1500  vancomycin (VANCOCIN) IVPB 1000 mg/200 mL premix  Status:  Discontinued     1,000 mg 200 mL/hr over 60 Minutes Intravenous Every 48 hours 03/29/15 1312 04/01/15 2151   03/30/15 1400  ceFEPIme (MAXIPIME) 1 g in dextrose 5 % 50 mL IVPB  Status:  Discontinued     1 g 100 mL/hr over 30 Minutes Intravenous Every 24 hours 03/29/15 1312 04/01/15 2151   03/29/15 1315  ceFEPIme (MAXIPIME) 1 g in dextrose 5 %  50 mL IVPB     1 g 100 mL/hr over 30 Minutes Intravenous  Once 03/29/15 1252 03/29/15 1448   03/29/15 1315  vancomycin (VANCOCIN) 1,250 mg in sodium chloride 0.9 % 250 mL IVPB     1,250 mg 166.7 mL/hr over 90 Minutes Intravenous  Once 03/29/15 1311 03/29/15 1947        Subjective:   [name] was seen and examined today.  Overall improving, denies any specific complaints, eating breakfast. No chest pain. Shortness of breath improving. Denies any dizziness, abdominal pain, N/V/D/C, new weakness, numbess, tingling. No acute events overnight.    Objective:   Blood pressure 151/40, pulse 78, temperature 98.6 F (37 C), temperature source Oral, resp. rate 19, height 4\' 10"  (1.473 m), weight 63.4 kg (139 lb 12.4 oz), SpO2 94 %.  Wt Readings from Last 3 Encounters:  04/03/15 63.4 kg (139 lb 12.4 oz)  03/26/15 58 kg (127 lb 13.9 oz)  03/12/15 59.875 kg (132 lb)     Intake/Output Summary (Last 24 hours) at 04/04/15 1220 Last data filed at 04/04/15 0857  Gross per 24 hour  Intake    923 ml  Output   1077 ml  Net   -154 ml    Exam  General: Alert and oriented x 3, NAD  HEENT:  PERRLA, EOMI,  Anicteric Sclera, mucous membranes moist.   Neck: Supple, no JVD, no masses  CVS: S1 S2 auscultated, no rubs, murmurs or gallops. Regular rate and rhythm.  Respiratory: Clear to auscultation bilaterally, no wheezing, rales or rhonchi  Abdomen: Soft, nontender, nondistended, + bowel sounds  Ext: no cyanosis clubbing or edema  Neuro: AAOx3, Cr N's II- XII. Strength 5/5 upper and lower extremities bilaterally  Skin: No rashes  Psych: Normal affect and demeanor, alert and oriented x3    Data Review   Micro Results Recent Results (from the past 240 hour(s))  Culture, blood (Routine X 2) w Reflex to ID Panel     Status: None   Collection Time: 03/29/15 12:40 PM  Result Value Ref Range Status   Specimen Description BLOOD RIGHT ANTECUBITAL  Final   Special Requests BOTTLES DRAWN AEROBIC AND ANAEROBIC 4MLS  Final   Culture NO GROWTH 5 DAYS  Final   Report Status 04/03/2015 FINAL  Final  Culture, blood (Routine X 2) w Reflex to ID Panel     Status: None   Collection Time: 03/29/15 12:50 PM  Result Value Ref Range Status   Specimen Description BLOOD LEFT ANTECUBITAL  Final   Special Requests BOTTLES DRAWN AEROBIC AND ANAEROBIC 5CCS  Final   Culture  Setup Time   Final    GRAM POSITIVE COCCI IN CLUSTERS ANAEROBIC BOTTLE ONLY CRITICAL RESULT CALLED TO, READ BACK BY AND VERIFIED WITH: ANITE,RN ON 6E @0437  03/31/15 MKELLY    Culture STAPHYLOCOCCUS AUREUS  Final   Report Status 04/01/2015 FINAL  Final   Organism ID, Bacteria STAPHYLOCOCCUS AUREUS  Final      Susceptibility   Staphylococcus aureus - MIC*    CIPROFLOXACIN <=0.5 SENSITIVE Sensitive     ERYTHROMYCIN <=0.25 SENSITIVE Sensitive     GENTAMICIN <=0.5 SENSITIVE Sensitive     OXACILLIN 0.5 SENSITIVE Sensitive     TETRACYCLINE <=1 SENSITIVE Sensitive     VANCOMYCIN <=0.5 SENSITIVE Sensitive     TRIMETH/SULFA <=10 SENSITIVE Sensitive     CLINDAMYCIN <=0.25 SENSITIVE Sensitive     RIFAMPIN <=0.5 SENSITIVE Sensitive      Inducible Clindamycin NEGATIVE Sensitive     *  STAPHYLOCOCCUS AUREUS  Culture, blood (routine x 2)     Status: None (Preliminary result)   Collection Time: 03/31/15  7:20 PM  Result Value Ref Range Status   Specimen Description BLOOD LEFT ANTECUBITAL  Final   Special Requests BOTTLES DRAWN AEROBIC AND ANAEROBIC 5CC   Final   Culture NO GROWTH 4 DAYS  Final   Report Status PENDING  Incomplete  Culture, blood (routine x 2)     Status: None (Preliminary result)   Collection Time: 03/31/15  7:30 PM  Result Value Ref Range Status   Specimen Description BLOOD RIGHT ANTECUBITAL  Final   Special Requests BOTTLES DRAWN AEROBIC AND ANAEROBIC 5CC   Final   Culture NO GROWTH 4 DAYS  Final   Report Status PENDING  Incomplete    Radiology Reports Ct Chest Wo Contrast  03/30/2015  CLINICAL DATA:  65 year old female with history of pulmonary infiltrates on recent chest x-ray. EXAM: CT CHEST WITHOUT CONTRAST TECHNIQUE: Multidetector CT imaging of the chest was performed following the standard protocol without IV contrast. COMPARISON:  Chest CT 12/14/2010.  Chest x-ray 10/16/2015. FINDINGS: Mediastinum/Lymph Nodes: Heart size is borderline enlarged. There is no significant pericardial fluid, thickening or pericardial calcification. There is atherosclerosis of the thoracic aorta, the great vessels of the mediastinum and the coronary arteries, including calcified atherosclerotic plaque in the left main, left anterior descending, left circumflex and right coronary arteries. Multiple enlarged mediastinal lymph nodes are noted measuring up to 16 mm in short axis in the right paratracheal nodal station. Prominent tissue in the hilar regions is also noted, which could suggest additional lymphadenopathy, although this is poorly evaluated on today's noncontrast CT examination. Esophagus is unremarkable in appearance. No axillary lymphadenopathy. Lungs/Pleura: In the lungs bilaterally (right greater than left) there multiple  nodular and mass-like areas of apparent airspace consolidation, largest of which is in the posterior aspect of the right lower lobe (image 38 of series 3) measuring 4.1 x 2.4 cm. The majority of these areas have some surrounding ground-glass attenuation. There is also some associated interlobular septal thickening in the lung bases bilaterally. No pleural effusions. Upper Abdomen: Status post cholecystectomy. Musculoskeletal/Soft Tissues: There are no aggressive appearing lytic or blastic lesions noted in the visualized portions of the skeleton. IMPRESSION: 1. Multiple nodular and mass-like areas of apparent airspace consolidation, asymmetrically distributed in the lungs bilaterally (right greater than left), which have an unusual appearance concerning for atypical infection. In particular, findings are concerning for potential fungal infection such as angioinvasive aspergillosis. 2. Associated with this there is mediastinal lymphadenopathy, as above. Although this could be reactive, the extensive lymphadenopathy is somewhat atypical for reactive lymphadenopathy, and accordingly, clinical correlation for signs and symptoms of lymphoproliferative disorder is recommended. 3. Atherosclerosis, including left main and 3 vessel coronary artery disease. Please note that although the presence of coronary artery calcium documents the presence of coronary artery disease, the severity of this disease and any potential stenosis cannot be assessed on this non-gated CT examination. Assessment for potential risk factor modification, dietary therapy or pharmacologic therapy may be warranted, if clinically indicated. Electronically Signed   By: Vinnie Langton M.D.   On: 03/30/2015 15:28   US Renal  03/07/2015  CLINICAL DATA:  Acute kidney failure.  Diabetes and hypertension. EXAM: RENAL / URINARY TRACT ULTRASOUND COMPLETE COMPARISON:  09/08/2012 FINDINGS: Right Kidney: Length: 11.1 cm. Mildly increased parenchymal echogenicity,  consistent with medical renal disease. No mass or hydronephrosis visualized. Left Kidney: Length: 10.0 cm. Mildly increased parenchymal  echogenicity, consistent with medical renal disease. No mass or hydronephrosis visualized. Bladder: Appears normal for degree of bladder distention. IMPRESSION: Mildly increased renal parenchymal echogenicity, consistent with medical renal disease. No evidence of hydronephrosis. Electronically Signed   By: Earle Gell M.D.   On: 03/07/2015 16:19   Dg Chest Portable 1 View  03/29/2015  CLINICAL DATA:  Short of breath EXAM: PORTABLE CHEST 1 VIEW COMPARISON:  03/25/2015 FINDINGS: Partial clearing of bilateral airspace disease with small areas of residual infiltrate bilaterally. No effusion. Heart size mildly enlarged. IMPRESSION: Partial clearing and bilateral airspace disease. Possible pneumonia versus edema. Electronically Signed   By: Franchot Gallo M.D.   On: 03/29/2015 12:04   Dg Chest Port 1 View  03/25/2015  CLINICAL DATA:  65 year old female with shortness of breath and weakness today. EXAM: PORTABLE CHEST 1 VIEW COMPARISON:  Chest x-ray 03/08/2015. FINDINGS: There is cephalization of the pulmonary vasculature and slight indistinctness of the interstitial markings suggestive of mild pulmonary edema. No definite pleural effusions. Heart size is mildly enlarged. The patient is rotated to the left on today's exam, resulting in distortion of the mediastinal contours and reduced diagnostic sensitivity and specificity for mediastinal pathology. Atherosclerosis in the thoracic aorta. IMPRESSION: 1. The appearance the chest suggests congestive heart failure, as above. Electronically Signed   By: Vinnie Langton M.D.   On: 03/25/2015 17:50   Dg Chest Port 1 View  03/08/2015  CLINICAL DATA:  COPD, congestive heart failure.  Short of breath. EXAM: PORTABLE CHEST 1 VIEW COMPARISON:  03/04/2015 FINDINGS: Normal cardiac silhouette. Mild bronchitic markings centrally similar prior.  Some linear nodular opacity at the RIGHT lung base. No pneumothorax. IMPRESSION: Mild linear nodular pattern RIGHT lung base could represent early pneumonia. Overall similar exam with central chronic bronchitic markings. Electronically Signed   By: Suzy Bouchard M.D.   On: 03/08/2015 12:56    CBC  Recent Labs Lab 03/29/15 1043 03/30/15 0604 03/31/15 0614 04/02/15 0746  WBC 13.5* 10.2 8.9 11.9*  HGB 9.5* 9.0* 8.7* 9.6*  HCT 28.4* 26.7* 26.1* 29.2*  PLT 222 208 254 341  MCV 87.9 88.1 88.5 88.8  MCH 29.4 29.7 29.5 29.2  MCHC 33.5 33.7 33.3 32.9  RDW 15.2 15.5 15.6* 15.7*    Chemistries   Recent Labs Lab 03/29/15 1041  03/30/15 0604 03/31/15 [id number] 04/01/15 0523 04/02/15 0746 04/03/15 0607 04/04/15 0440  NA  --   < > 139 142 142 142 147* 141  K  --   < > 3.3* 3.9 3.4* 3.7 4.1 2.8*  CL  --   < > 107 107 108 106 110 108  CO2  --   < > 19* 19* 22 21* 21* 21*  GLUCOSE  --   < > 94 84 97 91 60* 87  BUN  --   < > 53* 49* 46* 45* 42* 36*  CREATININE  --   < > 3.53* 3.36* 3.10* 2.98* 2.90* 2.68*  CALCIUM  --   < > 5.3* 5.7* 6.0* 6.1* 6.4* 6.2*  MG 1.2*  --  1.8 1.7  --   --   --   --   AST  --   --  17  --   --   --   --   --   ALT  --   --  11*  --   --   --   --   --   ALKPHOS  --   --  93  --   --   --   --   --  BILITOT  --   --  0.6  --   --   --   --   --   < > = values in this interval not displayed. ------------------------------------------------------------------------------------------------------------------ estimated creatinine clearance is 16.7 mL/min (by C-G formula based on Cr of 2.68). ------------------------------------------------------------------------------------------------------------------ No results for input(s): HGBA1C in the last 72 hours. ------------------------------------------------------------------------------------------------------------------ No results for input(s): CHOL, HDL, LDLCALC, TRIG, CHOLHDL, LDLDIRECT in the last 72  hours. ------------------------------------------------------------------------------------------------------------------ No results for input(s): TSH, T4TOTAL, T3FREE, THYROIDAB in the last 72 hours.  Invalid input(s): FREET3 ------------------------------------------------------------------------------------------------------------------ No results for input(s): VITAMINB12, FOLATE, FERRITIN, TIBC, IRON, RETICCTPCT in the last 72 hours.  Coagulation profile No results for input(s): INR, PROTIME in the last 168 hours.  No results for input(s): DDIMER in the last 72 hours.  Cardiac Enzymes No results for input(s): CKMB, TROPONINI, MYOGLOBIN in the last 168 hours.  Invalid input(s): CK ------------------------------------------------------------------------------------------------------------------ Invalid input(s): POCBNP   Recent Labs  04/03/15 2045 04/04/15 0804  GLUCAP 19* 4     Arrion Burruel M.D. [facility] 04/04/2015, 12:20 PM  Pager: [phone] Between 7am to 7pm - call Pager - [phone]  After 7pm go to [email or web address] - password [email or web address]  Call night coverage person covering after 7pm

## 2015-04-05 DIAGNOSIS — J9601 Acute respiratory failure with hypoxia: Secondary | ICD-10-CM

## 2015-04-05 DIAGNOSIS — E46 Unspecified protein-calorie malnutrition: Secondary | ICD-10-CM

## 2015-04-05 DIAGNOSIS — J189 Pneumonia, unspecified organism: Secondary | ICD-10-CM

## 2015-04-05 DIAGNOSIS — J438 Other emphysema: Secondary | ICD-10-CM

## 2015-04-05 DIAGNOSIS — Z515 Encounter for palliative care: Secondary | ICD-10-CM

## 2015-04-05 LAB — CBC WITH DIFFERENTIAL/PLATELET
BASOS PCT: 1 %
Basophils Absolute: 0.1 10*3/uL (ref 0.0–0.1)
EOS ABS: 0.2 10*3/uL (ref 0.0–0.7)
EOS PCT: 1 %
HCT: 28.8 % — ABNORMAL LOW (ref 36.0–46.0)
Hemoglobin: 9.4 g/dL — ABNORMAL LOW (ref 12.0–15.0)
Lymphocytes Relative: 9 %
Lymphs Abs: 1.1 10*3/uL (ref 0.7–4.0)
MCH: 28.4 pg (ref 26.0–34.0)
MCHC: 32.6 g/dL (ref 30.0–36.0)
MCV: 87 fL (ref 78.0–100.0)
MONO ABS: 0.8 10*3/uL (ref 0.1–1.0)
MONOS PCT: 7 %
NEUTROS PCT: 82 %
Neutro Abs: 10.7 10*3/uL — ABNORMAL HIGH (ref 1.7–7.7)
Platelets: 341 10*3/uL (ref 150–400)
RBC: 3.31 MIL/uL — ABNORMAL LOW (ref 3.87–5.11)
RDW: 16.3 % — AB (ref 11.5–15.5)
WBC: 12.9 10*3/uL — ABNORMAL HIGH (ref 4.0–10.5)

## 2015-04-05 LAB — BASIC METABOLIC PANEL
Anion gap: 18 — ABNORMAL HIGH (ref 5–15)
BUN: 33 mg/dL — ABNORMAL HIGH (ref 6–20)
CALCIUM: 6.6 mg/dL — AB (ref 8.9–10.3)
CO2: 16 mmol/L — AB (ref 22–32)
CREATININE: 2.51 mg/dL — AB (ref 0.44–1.00)
Chloride: 109 mmol/L (ref 101–111)
GFR calc non Af Amer: 19 mL/min — ABNORMAL LOW (ref >60)
GFR, EST AFRICAN AMERICAN: 22 mL/min — AB (ref >60)
Glucose, Bld: 77 mg/dL (ref 65–99)
Potassium: 3.8 mmol/L (ref 3.5–5.1)
SODIUM: 143 mmol/L (ref 135–145)

## 2015-04-05 LAB — GLUCOSE, CAPILLARY
GLUCOSE-CAPILLARY: 87 mg/dL (ref 65–99)
GLUCOSE-CAPILLARY: 101 mg/dL — AB (ref 65–99)
Glucose-Capillary: 141 mg/dL — ABNORMAL HIGH (ref 65–99)
Glucose-Capillary: 204 mg/dL — ABNORMAL HIGH (ref 65–99)

## 2015-04-05 LAB — CULTURE, BLOOD (ROUTINE X 2)
CULTURE: NO GROWTH
CULTURE: NO GROWTH

## 2015-04-05 MED ORDER — MAGNESIUM SULFATE 2 GM/50ML IV SOLN
2.0000 g | Freq: Once | INTRAVENOUS | Status: AC
  Administered 2015-04-05: 2 g via INTRAVENOUS
  Filled 2015-04-05: qty 50

## 2015-04-05 NOTE — Consult Note (Signed)
Name: [name] MRN: [id number] DOB: 1950/08/21    ADMISSION DATE:  03/29/2015 CONSULTATION DATE:  04/05/15  REFERRING MD :  Algis Liming  CHIEF COMPLAINT:  Short of breath and cough  BRIEF PATIENT DESCRIPTION:  [name] recently quit smoking, admitted for sob and cough. 6 admissions in 6 months, most recently w GIB. Treated for HCAP initially with vanc/ceph. Now on cefazolin per ID after blood cx MSSA.  Subsequent CT and CXR show nodular infiltrates, R>L, lymphadenopathy. PCCM is asked about sampling. Complicating co-morbidities incl COPD/home O2, CAD, dCHF, CKD IV w anemia, jejunal GIB.  SIGNIFICANT EVENTS   STUDIES:  HISTORY OF PRESENT ILLNESS:  [name] recently quit smoking, admitted for sob and cough. 6 admissions in 6 months, most recently w GIB. Treated for HCAP initially with vanc/ceph. Now on cefazolin per ID after blood cx MSSA.  Subsequent CT and CXR show nodular infiltrates, R>L, lymphadenopathy. PCCM is asked about sampling. Complicating co-morbidities incl COPD/home O2, CAD, dCHF, CKD IV w anemia, jejunal GIB.  PAST MEDICAL HISTORY :   has a past medical history of Peripheral vascular disease ([place]); Diabetes mellitus; COPD (chronic obstructive pulmonary disease) ([place]); Hyperlipidemia; Depression; Chronic diastolic CHF (congestive heart failure) ([place]); Essential hypertension; Renal artery stenosis ([facility]); CAD (coronary artery disease); Iron deficiency anemia; Diabetic retinopathy ([place]); GI AVM (gastrointestinal arteriovenous vascular malformation); Upper GI bleed from jejunum, AVM vs. Dieaulafoy's lesion (01/25/2012); Carotid artery disease ([place]); Hypertensive heart disease with congestive heart failure ([place]); CKD (chronic kidney disease), stage III; Tobacco abuse; Bradycardia; Anemia, chronic disease (05/16/2008); Dementia; and Protein-calorie malnutrition ([place]) (01/24/2015).  has past surgical history that includes Carotid endarterectomy; Cholecystectomy; Appendectomy; Tonsillectomy;  Cesarean section; Shoulder arthroscopy; Iliac artery stent; enteroscopy (01/26/2012); Cardiac catheterization (N/A, 01/23/2015); and enteroscopy (N/A, 03/25/2015).   Prior to Admission medications   Medication Sig Start Date End Date Taking? Authorizing Provider  albuterol (PROVENTIL HFA;VENTOLIN HFA) 108 (90 BASE) MCG/ACT inhaler Inhale 2 puffs into the lungs every 6 (six) hours as needed for wheezing or shortness of breath. 02/17/15  Yes Arnoldo Morale, MD  amLODipine (NORVASC) 10 MG tablet Take 0.5 tablets (5 mg total) by mouth daily. 03/26/15  Yes Lavina Hamman, MD  aspirin EC 81 MG tablet Take 1 tablet (81 mg total) by mouth daily. 02/17/15  Yes Arnoldo Morale, MD  atorvastatin (LIPITOR) 80 MG tablet Take 1 tablet (80 mg total) by mouth daily at 6 PM. 02/17/15  Yes Arnoldo Morale, MD  benzonatate (TESSALON) 100 MG capsule Take 1 capsule (100 mg total) by mouth 3 (three) times daily as needed for cough. 03/26/15  Yes Lavina Hamman, MD  calcium-vitamin D 250-100 MG-UNIT tablet Take 1 tablet by mouth daily. 03/26/15  Yes Lavina Hamman, MD  carvedilol (COREG) 3.125 MG tablet Take 1 tablet (3.125 mg total) by mouth 2 (two) times daily with a meal. 03/12/15  Yes Ripudeep K Rai, MD  clopidogrel (PLAVIX) 75 MG tablet Take 1 tablet (75 mg total) by mouth daily. 02/17/15  Yes Arnoldo Morale, MD  famotidine (PEPCID) 20 MG tablet Take 1 tablet (20 mg total) by mouth daily. 03/12/15  Yes Ripudeep Krystal Eaton, MD  feeding supplement, ENSURE ENLIVE, (ENSURE ENLIVE) LIQD Take 237 mLs by mouth daily. 03/26/15  Yes Lavina Hamman, MD  Ferrous Sulfate (IRON) 325 (65 FE) MG TABS Take 325 mg by mouth 3 (three) times daily. 02/17/15  Yes Arnoldo Morale, MD  fish oil-omega-3 fatty acids 1000 MG capsule Take 1 g by mouth 3 (three)  times daily. Three times daily   Yes Historical Provider, MD  furosemide (LASIX) 20 MG tablet Take 1 tablet (20 mg total) by mouth daily. 03/26/15  Yes Lavina Hamman, MD  guaiFENesin (MUCINEX) 600 MG 12 hr tablet Take  1 tablet (600 mg total) by mouth 2 (two) times daily. 03/26/15  Yes Lavina Hamman, MD  hydrocortisone 2.5 % lotion Apply topically 2 (two) times daily. 01/08/15  Yes Dorena Dew, FNP  insulin aspart (NOVOLOG) 100 UNIT/ML injection Before each meal 3 times a day, 140-199 - 2 units, 200-250 - 4 units, 251-299 - 6 units,  300-349 - 8 units,  350 or above 10 units. Dispense syringes and needles as needed, Ok to switch to PEN if approved. Substitute to any brand approved. DX DM2, Code E11.65 03/09/15  Yes Thurnell Lose, MD  isosorbide mononitrate (IMDUR) 30 MG 24 hr tablet Take 1 tablet (30 mg total) by mouth daily. 02/17/15  Yes Arnoldo Morale, MD  mometasone-formoterol (DULERA) 100-5 MCG/ACT AERO Inhale 1 puff into the lungs daily. 03/27/15  Yes Arnoldo Morale, MD  nitroGLYCERIN (NITROSTAT) 0.4 MG SL tablet Place 1 tablet (0.4 mg total) under the tongue every 5 (five) minutes as needed for chest pain. 01/28/15  Yes Delfina Redwood, MD  sertraline (ZOLOFT) 50 MG tablet Take 1 tablet (50 mg total) by mouth daily. 02/04/15  Yes Arnoldo Morale, MD  sodium bicarbonate 650 MG tablet Take 1 tablet (650 mg total) by mouth 2 (two) times daily. 03/26/15  Yes Lavina Hamman, MD  tamsulosin (FLOMAX) 0.4 MG CAPS capsule Take 1 capsule (0.4 mg total) by mouth daily. 03/12/15  Yes Ripudeep Krystal Eaton, MD   No Known Allergies  FAMILY HISTORY:  family history includes Angina in her father; Cancer in her brother and sister; Heart attack in her mother; Hypertension in her father and mother. There is no history of Stroke. SOCIAL HISTORY:  reports that she quit smoking about 7 weeks ago. Her smoking use included Cigarettes. She smoked 0.00 packs per day for 36 years. She has never used smokeless tobacco. She reports that she does not drink alcohol or use illicit drugs.  REVIEW OF SYSTEMS:  += pos Constitutional: Negative for fever, +chills, weight loss, malaise/fatigue and diaphoresis.  HENT: Negative for hearing loss, ear pain,  nosebleeds, congestion, sore throat, neck pain, tinnitus and ear discharge.   Eyes: Negative for blurred vision, double vision, photophobia, pain, discharge and redness.  Respiratory: + cough, hemoptysis, +sputum production, +shortness of breath, wheezing and stridor.   Cardiovascular: Negative for chest pain, palpitations, orthopnea, claudication, leg swelling and PND.  Gastrointestinal: Negative for heartburn, nausea, vomiting, abdominal pain, diarrhea, constipation, blood in stool and melena.  Genitourinary: Negative for dysuria, urgency, frequency, hematuria and flank pain.  Musculoskeletal: Negative for myalgias, back pain, joint pain and falls.  Skin: Negative for itching and rash.  Neurological: Negative for dizziness, tingling, tremors, sensory change, speech change, focal weakness, seizures, loss of consciousness, weakness and headaches.  Endo/Heme/Allergies: Negative for environmental allergies and polydipsia. Does not bruise/bleed easily.  SUBJECTIVE:   VITAL SIGNS: Temp:  [98 F (36.7 C)-98.6 F (37 C)] 98 F (36.7 C) (02/12 0758) Pulse Rate:  [69-77] 69 (02/12 0758) Resp:  [16-18] 18 (02/12 0758) BP: (104-148)/(39-56) 148/39 mmHg (02/12 0758) SpO2:  [91 %-98 %] 91 % (02/12 0758)  PHYSICAL EXAMINATION: General:  Calm, pleasant elderly F, lying in bed, cooperative, NAD Neuro:  Grossly intact, non-focal, seems oriented HEENT: mucosa moist, no  stridor, speech clear, missing teeth Cardiovascular:  RRR, no mgr, no jvd or periph edema Lungs:  Few faint crackles, no cough, unlabored on O2 Abdomen: soft, quiet, no HSM, non-tender Musculoskeletal:  Grossly normal muscle bulk Skin:  No rash. decubitus is charted   Recent Labs Lab 04/03/15 0607 04/04/15 0440 04/05/15 0612  NA 147* 141 143  K 4.1 2.8* 3.8  CL 110 108 109  CO2 21* 21* 16*  BUN 42* 36* 33*  CREATININE 2.90* 2.68* 2.51*  GLUCOSE 60* 87 77    Recent Labs Lab 04/02/15 0746 04/04/15 1410 04/05/15 0612    HGB 9.6* 9.0* 9.4*  HCT 29.2* 27.4* 28.8*  WBC 11.9* 10.2 12.9*  PLT 341 337 341   Dg Chest 2 View  04/04/2015  CLINICAL DATA:  Difficulty breathing the past few days. EXAM: CHEST  2 VIEW COMPARISON:  03/29/2015 FINDINGS: Persistent nodular airspace disease seen over the right upper and lower lobes. There is also a streaky lingular opacity. No evidence of progression by radiography. Stable cardiomegaly. Negative aortic and hilar contours. No effusion or air leak. IMPRESSION: Persisting nodular airspace disease over the right lung. Correlate with chest CT report 03/30/2015. Cardiomegaly without failure. Electronically Signed   By: Monte Fantasia M.D.   On: 04/04/2015 13:12   I reviewed recent CT and CXRs and agree w report. There is peribronchial thickening in LLL  ASSESSMENT / PLAN:  HCAP- Nodular infiltrates appear new on recent images, and appear inflammatory. These could be resolving pneumonia, septic nodules. She is frail, but there is peribronchial thickening in L base which might be sampled by routine bronch with brush/ BAL for cytologies and broad cultures. Needle bx of parenchymal lesions risk ptx, bleed and contamination of pleural space, but would be alternative. WBC may be trending up, which is main argument against continued antibiotics/ observation w repeat CXR or CT. I will discuss w team in AM.  CD Young, MD Pulmonary [facility] [place] [id number] After 3:00 PM [phone]  04/05/2015, 2:02 PM
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
## 2015-04-05 NOTE — Progress Notes (Signed)
PROGRESS NOTE    [name] [id number] DOB: [birth year] DOA: 03/29/2015 PCP: Pcp Not In System  HPI/Brief narrative 65 year old female with multiple medical problems including but not limited to CKD stage IV, COPD on home oxygen when necessary, chronic diastolic CHF, hypertension, recent GI bleed, CAD, hyperlipidemia, diet-controlled diabetes mellitus presented with 2-3 day history of cough, dyspnea on exertion, and subjective fevers. Notably, the patient was recently discharged from the hospital on 03/26/2015 after treatment for upper GI bleed. She underwent endoscopy (Dr. Wilfrid Lund III) which did not show evidence of active bleeding but there was a suspected distal intestinal ulcer or AVM. She was also seen by cardiology secondary to chest discomfort and elevated troponins. The patient had a Myoview on 01/22/2015 with intermediate risk, EF 52% They did not feel that she was a candidate for open heart surgery or cardiac catheterization or percutaneous intervention given her bleeding risk and renal failure. She was to be managed medically from a cardiac standpoint. Cardiology felt that she could be continued with clopidogrel monotherapy after discharge. At the time of this admission, the patient was noted to be hypocalcemic with a corrected calcium of 6.8. The patient was recently treated for hypercalcemia with intravenous pamidronate which was thought to be due to dehydration and calcium supplementation (overuse of Tums).   Assessment/Plan:   1. Healthcare associated pneumonia: Treated empirically with IV cefepime and vancomycin. CT chest without contrast: Showed bilateral nodular masslike opacities right >left with mediastinal lymphadenopathy. Treating for likely infectious etiology. ID input appreciated. Given Staphaureus bacteremia, pneumonia may be same. Antibiotics transitioned to IV cefazolin. Discussed with Dr. Baxter Flattery, ID on 2/12: CT findings concerning for possible septic embolization  versus malignancy versus opportunistic infection. Repeat chest x-ray shows persisting nodular ASD of right lung and feel some form of sampling of right lung for infectious versus malignant etiology is appropriate. Pulmonology consulted on 2/12. 2. Acute on chronic respiratory failure with hypoxia: Likely secondary to pneumonia. Improving. 3. MSSA bacteremia: One of 2 blood cultures positive on admission. ID input appreciated. Surveillance blood cultures negative to date. Changed to IV Ancef. TTE does not show vegetations. ID recommends 2 weeks of cefazolin via PICC line. Will discuss PICC line with primary nephrologist Dr. Florene Glen on 2/13.  4. Acute on stage III-IV chronic kidney disease/renal artery stenosis/metabolic acidosis: Baseline creatinine 3-3 0.3. Metabolic acidosis has improved. Renal function back to baseline. Outpatient follow-up with Dr. Florene Glen, nephrology. Creatinine has improved to 2.5. Bicarbonate abruptly dropped from 21 > 16-probably lab error. Follow-up BMP in a.m. 5. Recent upper GI bleed: Hemoglobin stable. Ferritin 360. Resolved. 6. Hypocalcemia/low vitamin D/recent hypercalcemia: Vitamin D level on 1/13 < 5 and repeat vitamin D level on 1/27 after supplementation was 7.2. Received vitamin D 50,000 units 1 and started 1000 units daily. Recommend repeating vitamin D level in next 4-6 weeks. 2 admissions ago, patient presented with hypercalcemia related to overuse of Tums, dehydration and acute renal failure. At that time she received calcitonin and IV pamidronate. During subsequent admission, she was hypocalcemic and started on calcium supplements. No clinical manifestations of hypocalcemia. Corrected serum calcium starting to rise again and due to prior concern for hypercalcemia, discussed with nephrologist on call and since she is asymptomatic, we will reduce her calcium supplements to 500 MG 3 times a day. Outpatient follow-up with nephrology regarding initiating  calcitriol. 7. Hypomagnesemia: We'll replace and follow. 8. Anemia of chronic kidney disease/iron deficiency and recent GI bleed: RBC folate 1457, iron saturation 5%, B12:  1290. Continue iron sulfate. Status post Aranesp. Hemoglobin stable. 9. COPD/chronic hypoxic respiratory failure: As per patient, utilizes oxygen only at night or when necessary. Stable. 10. Chronic diastolic CHF: Compensated. 11. Essential hypertension: Reasonable inpatient control. Continue amlodipine, carvedilol and Imdur. ARB discontinued during last admission due to CKD 12. Former tobacco abuse: Quit 3 months ago. 13. Hyperlipidemia: Continue Lipitor and omega-3 fatty acids 14. Diet-controlled DM 2: A1c: 6.2 15. CAD: Asymptomatic 41. Protein calorie malnutrition: Continue nutritional supplementations 17. Lewy body dementia without behavioral disturbance: Mental status at baseline 18. Sacral ulcer: Per [place] RN 19. Hypokalemia: Replaced 20. Adult failure to thrive: Patient has had 6 admissions in the last 6 months. Multifactorial due to severe comorbidities and age. Palliative care input appreciated. Transitioned to DO NOT RESUSCITATE but otherwise full treatment and patient/family hopeful for some sort of improvement.   DVT prophylaxis: SCDs Code Status: Changed to DO NOT RESUSCITATE on 04/05/15 Family Communication: None at bedside this morning.  Disposition Plan: DC home when medically stable   Consultants:  Infectious disease  Pulmonology  Palliative care team  Procedures:  None  Antimicrobials:  IV cefepime 2/5 > 2/8  IV vancomycin 2/5 > 2/8  IV cefazolin 2/8 >   Subjective: Denies complaints.  Objective: Filed Vitals:   04/04/15 1629 04/04/15 2211 04/05/15 0506 04/05/15 0758  BP: 146/54 129/56 104/42 148/39  Pulse: 77 70 73 69  Temp: 98.2 F (36.8 C) 98.6 F (37 C) 98.1 F (36.7 C) 98 F (36.7 C)  TempSrc: Oral Oral Oral Oral  Resp: 18 16 16 18   Height:      Weight:      SpO2: 98%  95% 94% 91%    Intake/Output Summary (Last 24 hours) at 04/05/15 1529 Last data filed at 04/05/15 1400  Gross per 24 hour  Intake    890 ml  Output    650 ml  Net    240 ml   Filed Weights   04/01/15 0433 04/02/15 2041 04/03/15 2045  Weight: 63.3 kg (139 lb 8.8 oz) 63.2 kg (139 lb 5.3 oz) 63.4 kg (139 lb 12.4 oz)    Exam:  General exam: Small built and frail pleasant middle-aged female sitting up comfortably at edge of bed this morning. Appears chronically ill-looking. Respiratory system: Clear. No increased work of breathing. Cardiovascular system: S1 & S2 heard, RRR. No JVD, murmurs, gallops, clicks or pedal edema.  Gastrointestinal system: Abdomen is nondistended, soft and nontender. Normal bowel sounds heard. Central nervous system: Alert and oriented. No focal neurological deficits. Extremities: Symmetric 5 x 5 power.   Data Reviewed: Basic Metabolic Panel:  Recent Labs Lab 03/30/15 0604 03/31/15 [id number] 04/01/15 0523 04/02/15 0746 04/03/15 0607 04/04/15 0440 04/04/15 1410 04/05/15 0612  NA 139 142 142 142 147* 141  --  143  K 3.3* 3.9 3.4* 3.7 4.1 2.8*  --  3.8  CL 107 107 108 106 110 108  --  109  CO2 19* 19* 22 21* 21* 21*  --  16*  GLUCOSE 94 84 97 91 60* 87  --  77  BUN 53* 49* 46* 45* 42* 36*  --  33*  CREATININE 3.53* 3.36* 3.10* 2.98* 2.90* 2.68*  --  2.51*  CALCIUM 5.3* 5.7* 6.0* 6.1* 6.4* 6.2*  --  6.6*  MG 1.8 1.7  --   --   --   --  1.4*  --   PHOS 3.3  --   --  2.5 3.5  --   --   --  Liver Function Tests:  Recent Labs Lab 03/30/15 0604 04/02/15 0746 04/03/15 0607  AST 17  --   --   ALT 11*  --   --   ALKPHOS 93  --   --   BILITOT 0.6  --   --   PROT 5.8*  --   --   ALBUMIN 1.8* 1.8* 1.6*   No results for input(s): LIPASE, AMYLASE in the last 168 hours. No results for input(s): AMMONIA in the last 168 hours. CBC:  Recent Labs Lab 03/30/15 0604 03/31/15 0614 04/02/15 0746 04/04/15 1410 04/05/15 0612  WBC 10.2 8.9 11.9* 10.2  12.9*  NEUTROABS  --   --   --  9.0* 10.7*  HGB 9.0* 8.7* 9.6* 9.0* 9.4*  HCT 26.7* 26.1* 29.2* 27.4* 28.8*  MCV 88.1 88.5 88.8 89.0 87.0  PLT 208 254 341 337 341   Cardiac Enzymes: No results for input(s): CKTOTAL, CKMB, CKMBINDEX, TROPONINI in the last 168 hours. BNP (last 3 results) No results for input(s): PROBNP in the last 8760 hours. CBG:  Recent Labs Lab 04/04/15 1216 04/04/15 1628 04/04/15 2338 04/05/15 0757 04/05/15 1147  GLUCAP 111* 114* 96 87 101*    Recent Results (from the past 240 hour(s))  Culture, blood (Routine X 2) w Reflex to ID Panel     Status: None   Collection Time: 03/29/15 12:40 PM  Result Value Ref Range Status   Specimen Description BLOOD RIGHT ANTECUBITAL  Final   Special Requests BOTTLES DRAWN AEROBIC AND ANAEROBIC 4MLS  Final   Culture NO GROWTH 5 DAYS  Final   Report Status 04/03/2015 FINAL  Final  Culture, blood (Routine X 2) w Reflex to ID Panel     Status: None   Collection Time: 03/29/15 12:50 PM  Result Value Ref Range Status   Specimen Description BLOOD LEFT ANTECUBITAL  Final   Special Requests BOTTLES DRAWN AEROBIC AND ANAEROBIC 5CCS  Final   Culture  Setup Time   Final    GRAM POSITIVE COCCI IN CLUSTERS ANAEROBIC BOTTLE ONLY CRITICAL RESULT CALLED TO, READ BACK BY AND VERIFIED WITH: ANITE,RN ON 6E @0437  03/31/15 MKELLY    Culture STAPHYLOCOCCUS AUREUS  Final   Report Status 04/01/2015 FINAL  Final   Organism ID, Bacteria STAPHYLOCOCCUS AUREUS  Final      Susceptibility   Staphylococcus aureus - MIC*    CIPROFLOXACIN <=0.5 SENSITIVE Sensitive     ERYTHROMYCIN <=0.25 SENSITIVE Sensitive     GENTAMICIN <=0.5 SENSITIVE Sensitive     OXACILLIN 0.5 SENSITIVE Sensitive     TETRACYCLINE <=1 SENSITIVE Sensitive     VANCOMYCIN <=0.5 SENSITIVE Sensitive     TRIMETH/SULFA <=10 SENSITIVE Sensitive     CLINDAMYCIN <=0.25 SENSITIVE Sensitive     RIFAMPIN <=0.5 SENSITIVE Sensitive     Inducible Clindamycin NEGATIVE Sensitive     *  STAPHYLOCOCCUS AUREUS  Culture, blood (routine x 2)     Status: None   Collection Time: 03/31/15  7:20 PM  Result Value Ref Range Status   Specimen Description BLOOD LEFT ANTECUBITAL  Final   Special Requests BOTTLES DRAWN AEROBIC AND ANAEROBIC 5CC   Final   Culture NO GROWTH 5 DAYS  Final   Report Status 04/05/2015 FINAL  Final  Culture, blood (routine x 2)     Status: None   Collection Time: 03/31/15  7:30 PM  Result Value Ref Range Status   Specimen Description BLOOD RIGHT ANTECUBITAL  Final   Special Requests BOTTLES DRAWN AEROBIC AND  ANAEROBIC 5CC   Final   Culture NO GROWTH 5 DAYS  Final   Report Status 04/05/2015 FINAL  Final         Studies: Dg Chest 2 View  04/04/2015  CLINICAL DATA:  Difficulty breathing the past few days. EXAM: CHEST  2 VIEW COMPARISON:  03/29/2015 FINDINGS: Persistent nodular airspace disease seen over the right upper and lower lobes. There is also a streaky lingular opacity. No evidence of progression by radiography. Stable cardiomegaly. Negative aortic and hilar contours. No effusion or air leak. IMPRESSION: Persisting nodular airspace disease over the right lung. Correlate with chest CT report 03/30/2015. Cardiomegaly without failure. Electronically Signed   By: Monte Fantasia M.D.   On: 04/04/2015 13:12        Scheduled Meds: . amLODipine  5 mg Oral Daily  . atorvastatin  80 mg Oral q1800  . calcium carbonate  1 tablet Oral TID WC  . carvedilol  3.125 mg Oral BID WC  .  ceFAZolin (ANCEF) IV  2 g Intravenous Q12H  . cholecalciferol  1,000 Units Oral Daily  . clopidogrel  75 mg Oral Daily  . collagenase   Topical Daily  . darbepoetin (ARANESP) injection - NON-DIALYSIS  40 mcg Subcutaneous Q Sun-1800  . famotidine  20 mg Oral Daily  . feeding supplement (NEPRO CARB STEADY)  237 mL Oral BID BM  . ferrous sulfate  325 mg Oral TID WC  . furosemide  20 mg Oral Daily  . guaiFENesin  600 mg Oral BID  . isosorbide mononitrate  30 mg Oral Daily  .  mometasone-formoterol  1 puff Inhalation Daily  . omega-3 acid ethyl esters  1 g Oral TID  . sertraline  50 mg Oral Daily  . sodium bicarbonate  650 mg Oral BID  . sodium chloride flush  3 mL Intravenous Q12H  . tamsulosin  0.4 mg Oral Daily   Continuous Infusions:   Principal Problem:   HCAP (healthcare-associated pneumonia) Active Problems:   Hyperlipidemia   Anemia, chronic disease   Chronic obstructive pulmonary disease (HCC)   Renal artery stenosis (HCC)   Peripheral vascular disease (HCC)   Upper GI bleed from jejunum, AVM vs. Dieaulafoy's lesion   Diet-controlled diabetes mellitus (HCC)   Iron deficiency anemia   On home oxygen therapy   CKD (chronic kidney disease), stage IV (HCC)   CAD (coronary artery disease)   Chronic diastolic CHF (congestive heart failure) ([place])   Essential hypertension   Former tobacco use   Protein-calorie malnutrition ([place])   Lewy body dementia without behavioral disturbance   Hypocalcemia   Sacral ulcer   Acute respiratory failure (HCC)   Hypomagnesemia   Low serum vitamin D   Metabolic acidosis   Malnutrition of moderate degree    Time spent: 25 minutes.    Vernell Leep, MD, FACP, FHM. [facility] Pager [phone]  If 7PM-7AM, please contact night-coverage [email or web address] Password [email or web address] 04/05/2015, 3:29 PM    LOS: 7 days

## 2015-04-05 NOTE — Progress Notes (Signed)
MEDICATION RELATED CONSULT NOTE - FOLLOW UP   Pharmacy Consult for darbopoetin Indication: anemia of chronic renal disease  No Known Allergies  Patient Measurements: Height: 4\' 10"  (147.3 cm) Weight: 139 lb 12.4 oz (63.4 kg) IBW/kg (Calculated) : 40.9   Vital Signs: Temp: 98.1 F (36.7 C) (02/12 0506) Temp Source: Oral (02/12 0506) BP: 104/42 mmHg (02/12 0506) Pulse Rate: 73 (02/12 0506) Intake/Output from previous day: 02/11 0701 - 02/12 0700 In: 890 [P.O.:840; IV Piggyback:50] Out: 925 [Urine:925] Intake/Output from this shift:    Labs:  Recent Labs  04/02/15 0746 04/03/15 0607 04/04/15 0440 04/04/15 1410 04/05/15 0612  WBC 11.9*  --   --  10.2 12.9*  HGB 9.6*  --   --  9.0* 9.4*  HCT 29.2*  --   --  27.4* 28.8*  PLT 341  --   --  337 341  CREATININE 2.98* 2.90* 2.68*  --   --   MG  --   --   --  1.4*  --   PHOS 2.5 3.5  --   --   --   ALBUMIN 1.8* 1.6*  --   --   --    Estimated Creatinine Clearance: 16.7 mL/min (by C-G formula based on Cr of 2.68).   Microbiology: Recent Results (from the past 720 hour(s))  Urine culture     Status: None   Collection Time: 03/08/15 11:47 AM  Result Value Ref Range Status   Specimen Description URINE, CATHETERIZED  Final   Special Requests NONE  Final   Culture >=100,000 COLONIES/mL ESCHERICHIA COLI  Final   Report Status 03/10/2015 FINAL  Final   Organism ID, Bacteria ESCHERICHIA COLI  Final      Susceptibility   Escherichia coli - MIC*    AMPICILLIN >=32 RESISTANT Resistant     CEFAZOLIN <=4 SENSITIVE Sensitive     CEFTRIAXONE <=1 SENSITIVE Sensitive     CIPROFLOXACIN >=4 RESISTANT Resistant     GENTAMICIN >=16 RESISTANT Resistant     IMIPENEM <=0.25 SENSITIVE Sensitive     NITROFURANTOIN <=16 SENSITIVE Sensitive     TRIMETH/SULFA >=320 RESISTANT Resistant     AMPICILLIN/SULBACTAM 16 INTERMEDIATE Intermediate     PIP/TAZO <=4 SENSITIVE Sensitive     * >=100,000 COLONIES/mL ESCHERICHIA COLI  Culture, blood  (Routine X 2) w Reflex to ID Panel     Status: None   Collection Time: 03/29/15 12:40 PM  Result Value Ref Range Status   Specimen Description BLOOD RIGHT ANTECUBITAL  Final   Special Requests BOTTLES DRAWN AEROBIC AND ANAEROBIC 4MLS  Final   Culture NO GROWTH 5 DAYS  Final   Report Status 04/03/2015 FINAL  Final  Culture, blood (Routine X 2) w Reflex to ID Panel     Status: None   Collection Time: 03/29/15 12:50 PM  Result Value Ref Range Status   Specimen Description BLOOD LEFT ANTECUBITAL  Final   Special Requests BOTTLES DRAWN AEROBIC AND ANAEROBIC 5CCS  Final   Culture  Setup Time   Final    GRAM POSITIVE COCCI IN CLUSTERS ANAEROBIC BOTTLE ONLY CRITICAL RESULT CALLED TO, READ BACK BY AND VERIFIED WITH: ANITE,RN ON 6E @0437  03/31/15 MKELLY    Culture STAPHYLOCOCCUS AUREUS  Final   Report Status 04/01/2015 FINAL  Final   Organism ID, Bacteria STAPHYLOCOCCUS AUREUS  Final      Susceptibility   Staphylococcus aureus - MIC*    CIPROFLOXACIN <=0.5 SENSITIVE Sensitive     ERYTHROMYCIN <=0.25 SENSITIVE Sensitive  GENTAMICIN <=0.5 SENSITIVE Sensitive     OXACILLIN 0.5 SENSITIVE Sensitive     TETRACYCLINE <=1 SENSITIVE Sensitive     VANCOMYCIN <=0.5 SENSITIVE Sensitive     TRIMETH/SULFA <=10 SENSITIVE Sensitive     CLINDAMYCIN <=0.25 SENSITIVE Sensitive     RIFAMPIN <=0.5 SENSITIVE Sensitive     Inducible Clindamycin NEGATIVE Sensitive     * STAPHYLOCOCCUS AUREUS  Culture, blood (routine x 2)     Status: None (Preliminary result)   Collection Time: 03/31/15  7:20 PM  Result Value Ref Range Status   Specimen Description BLOOD LEFT ANTECUBITAL  Final   Special Requests BOTTLES DRAWN AEROBIC AND ANAEROBIC 5CC   Final   Culture NO GROWTH 4 DAYS  Final   Report Status PENDING  Incomplete  Culture, blood (routine x 2)     Status: None (Preliminary result)   Collection Time: 03/31/15  7:30 PM  Result Value Ref Range Status   Specimen Description BLOOD RIGHT ANTECUBITAL  Final    Special Requests BOTTLES DRAWN AEROBIC AND ANAEROBIC 5CC   Final   Culture NO GROWTH 4 DAYS  Final   Report Status PENDING  Incomplete    Medications:  Scheduled:  . amLODipine  5 mg Oral Daily  . atorvastatin  80 mg Oral q1800  . calcium carbonate  1 tablet Oral TID WC  . carvedilol  3.125 mg Oral BID WC  .  ceFAZolin (ANCEF) IV  2 g Intravenous Q12H  . cholecalciferol  1,000 Units Oral Daily  . clopidogrel  75 mg Oral Daily  . collagenase   Topical Daily  . darbepoetin (ARANESP) injection - NON-DIALYSIS  40 mcg Subcutaneous Q Sun-1800  . famotidine  20 mg Oral Daily  . feeding supplement (NEPRO CARB STEADY)  237 mL Oral BID BM  . ferrous sulfate  325 mg Oral TID WC  . furosemide  20 mg Oral Daily  . guaiFENesin  600 mg Oral BID  . isosorbide mononitrate  30 mg Oral Daily  . mometasone-formoterol  1 puff Inhalation Daily  . omega-3 acid ethyl esters  1 g Oral TID  . sertraline  50 mg Oral Daily  . sodium bicarbonate  650 mg Oral BID  . sodium chloride flush  3 mL Intravenous Q12H  . tamsulosin  0.4 mg Oral Daily    Assessment: Darbopoetin for chronic anemia due to renal insufficiency. Hgb relatively stable with an Hgb today of 9.4. Last iron panel 2/5) revealed low Fe and saturation with normal ferritin. Being supplemented with PO FeSO4. Will continue current regimen.   Goal of Therapy:  Hgb >10.5  Plan: -Continue darbopoetin 23mcg qSun (dc if Hgb > 10.5) -Monitor response and adjust - f/u plans with renal if discharged   Judieth Keens, PharmD PGY1 Pharmacy Resident 04/05/2015,7:43 AM

## 2015-04-05 NOTE — Progress Notes (Signed)
Palliative:  Full note to follow. Long discussion with Ms. [name] along with her husband and daughter, [name], regarding complexity of medical issues and medical limitations for improvement. They are still hopeful for some level of improvement as they say she was doing fairly well "before the snow storm" but all has declined since then. She does express desire for DNR which family supports and respects. I will follow up tomorrow.   Vinie Sill, NP Palliative Medicine Team Pager # [phone] (M-F 8a-5p) Team Phone # [phone] (Nights/Weekends)

## 2015-04-06 ENCOUNTER — Inpatient Hospital Stay (HOSPITAL_COMMUNITY): Payer: Self-pay

## 2015-04-06 DIAGNOSIS — Z515 Encounter for palliative care: Secondary | ICD-10-CM | POA: Insufficient documentation

## 2015-04-06 DIAGNOSIS — Z959 Presence of cardiac and vascular implant and graft, unspecified: Secondary | ICD-10-CM

## 2015-04-06 DIAGNOSIS — J189 Pneumonia, unspecified organism: Secondary | ICD-10-CM

## 2015-04-06 LAB — RENAL FUNCTION PANEL
ALBUMIN: 1.5 g/dL — AB (ref 3.5–5.0)
ANION GAP: 16 — AB (ref 5–15)
BUN: 31 mg/dL — ABNORMAL HIGH (ref 6–20)
CALCIUM: 7.6 mg/dL — AB (ref 8.9–10.3)
CO2: 22 mmol/L (ref 22–32)
Chloride: 104 mmol/L (ref 101–111)
Creatinine, Ser: 2.61 mg/dL — ABNORMAL HIGH (ref 0.44–1.00)
GFR calc non Af Amer: 18 mL/min — ABNORMAL LOW (ref >60)
GFR, EST AFRICAN AMERICAN: 21 mL/min — AB (ref >60)
GLUCOSE: 159 mg/dL — AB (ref 65–99)
PHOSPHORUS: 3.2 mg/dL (ref 2.5–4.6)
POTASSIUM: 3.5 mmol/L (ref 3.5–5.1)
SODIUM: 142 mmol/L (ref 135–145)

## 2015-04-06 LAB — MAGNESIUM: Magnesium: 2 mg/dL (ref 1.7–2.4)

## 2015-04-06 LAB — GLUCOSE, CAPILLARY
GLUCOSE-CAPILLARY: 93 mg/dL (ref 65–99)
Glucose-Capillary: 140 mg/dL — ABNORMAL HIGH (ref 65–99)
Glucose-Capillary: 186 mg/dL — ABNORMAL HIGH (ref 65–99)

## 2015-04-06 MED ORDER — LIDOCAINE HCL 1 % IJ SOLN
INTRAMUSCULAR | Status: AC
  Filled 2015-04-06: qty 20

## 2015-04-06 MED ORDER — SODIUM CHLORIDE 0.9% FLUSH
10.0000 mL | INTRAVENOUS | Status: DC | PRN
  Administered 2015-04-07 (×2): 10 mL
  Filled 2015-04-06 (×2): qty 40

## 2015-04-06 MED ORDER — CALCITRIOL 0.5 MCG PO CAPS
0.5000 ug | ORAL_CAPSULE | Freq: Every day | ORAL | Status: DC
  Administered 2015-04-06 – 2015-04-07 (×2): 0.5 ug via ORAL
  Filled 2015-04-06: qty 1

## 2015-04-06 NOTE — Consult Note (Signed)
[facility] Consult Note     Date: 04/06/2015                  Patient Name:  [name]  MRN: [id number]  DOB: 1951-01-23  Age / Sex: 65 y.o., female         PCP: Pcp Not In System                 Service Requesting Consult: [facility]                  Reason for Consult: Hypocalcemia in CKD stage IV            Chief Complaint: SOB and productive cough HPI: Ms. 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 She was found to have HCAP and MSSA bacteremia. Renal was consulted as she was treated previously treated for hypercalcemia >15, however she is now hypocalcemic.    On EMR review, the patient was hospitalized in 03/04/15 due to hypercalcemia and acute encephalopathy. At that time it was thought to be due to excessive Tums use and dehydration and she was given a dose of Zometa 72m IV on 1/12 and calcitonin. Patient had extensive workup with stable PTH, SPEP without a M spike, ACE level normal, UPEP negative for M spike, and PTH-rp negative  Subsequently on 1/26, she was sent to the ED from her SNF for hypocalcemia, corrected in the ED it was noted to be 7.9 but was also found to have a GI bleed. At that time she was started on calcium-vitamin D 240-1069mdaily. On her current hospitalization, she was noted to have a Ca of 5.4 (unable to correct as it was a BMET and not a renal panel). She was stated on Oscal 5008mID. She was also started on Aranesp for chronic anemia.   Currently, the patient denies chest pain, fevers, chills, dysuria, or lower extremity edema. She has dark stools since starting iron supplement. Her SOB has improved significantly and her cough has almost completely resolved.   Past Medical History  Diagnosis Date  . Peripheral vascular disease ([place]   a.s  /p left common iliac and external iliac artery stents. b. H/o subclavian, mesenteric, and celiac artery stenosis (see below).  . Diabetes mellitus   . COPD (chronic obstructive pulmonary disease) (HCC)     home oxygen  . Hyperlipidemia   . Depression   . Chronic diastolic CHF (congestive heart failure) ([place] . Essential hypertension   . Renal artery stenosis ([place]   a. Duplex 2014: no obvious evidence of hemodynamically significant stenosis >60%, bilateral intrarenal arteries exhibit absent diastolic flow. There is evidence of elevated velocities of the mid aorta. 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  . CMarland KitchenD (coronary artery disease)   . Iron deficiency anemia   . Diabetic retinopathy ([place] . 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 Carotid artery disease ([place]   a. Duplex 12/2014: left CEA patent with elevated velocities, 60-79% RICA (stable over serial exams), 40-08-65%CA stenosis (stable over serial exams), 50% LECA, elevated velocities in bilateral subclavian arteries.  .[name]  Hypertensive heart disease with congestive heart failure ([place])   . CKD (chronic kidney disease), stage III   . Tobacco abuse   . Bradycardia     a. Limiting BB titration.  . Anemia, chronic disease 05/16/2008    Qualifier: Diagnosis of  By: Percival Spanish, MD, Farrel Gordon    . Dementia   . Protein-calorie malnutrition ([place]) 01/24/2015    Past Surgical History  Procedure Laterality Date  . Carotid endarterectomy    . Cholecystectomy    . Appendectomy    . Tonsillectomy    . Cesarean section    . Shoulder arthroscopy    . Iliac artery stent      left  . Enteroscopy  01/26/2012    Procedure: ENTEROSCOPY;  Surgeon: Gatha Mayer, MD;  Location: [facility];  Service: Endoscopy;  Laterality: N/A;  . Cardiac catheterization N/A 01/23/2015    Procedure: Left Heart Cath  and Coronary Angiography;  Surgeon: [name], MD;  Location: [facility];  Service: Cardiovascular;  Laterality: N/A;  . Enteroscopy N/A 03/25/2015    Procedure: ENTEROSCOPY;  Surgeon: Doran Stabler, MD;  Location: [facility];  Service: Endoscopy;  Laterality: N/A;    Family History  Problem Relation Age of Onset  . Hypertension Mother   . Heart attack Mother   . Hypertension Father   . Angina Father   . Cancer Sister   . Cancer Brother   . Stroke Neg Hx    Social History:  reports that she quit smoking about 7 weeks ago. Her smoking use included Cigarettes. She smoked 0.00 packs per day for 36 years. She has never used smokeless tobacco. She reports that she does not drink alcohol or use illicit drugs.  Allergies: No Known Allergies  Medications Prior to Admission  Medication Sig Dispense Refill  . albuterol (PROVENTIL HFA;VENTOLIN HFA) 108 (90 BASE) MCG/ACT inhaler Inhale 2 puffs into the lungs every 6 (six) hours as needed for wheezing or shortness of breath. 1 Inhaler 2  . amLODipine (NORVASC) 10 MG tablet Take 0.5 tablets (5 mg total) by mouth daily. 30 tablet 0  . aspirin EC 81 MG tablet Take 1 tablet (81 mg total) by mouth daily. 30 tablet 2  . atorvastatin (LIPITOR) 80 MG tablet Take 1 tablet (80 mg total) by mouth daily at 6 PM. 30 tablet 2  . benzonatate (TESSALON) 100 MG capsule Take 1 capsule (100 mg total) by mouth 3 (three) times daily as needed for cough. 20 capsule 0  . calcium-vitamin D 250-100 MG-UNIT tablet Take 1 tablet by mouth daily. 10 tablet 0  . carvedilol (COREG) 3.125 MG tablet Take 1 tablet (3.125 mg total) by mouth 2 (two) times daily with a meal.    . clopidogrel (PLAVIX) 75 MG tablet Take 1 tablet (75 mg total) by mouth daily. 30 tablet 2  . famotidine (PEPCID) 20 MG tablet Take 1 tablet (20 mg total) by mouth daily.    . feeding supplement, ENSURE ENLIVE, (ENSURE ENLIVE) LIQD Take 237 mLs by mouth daily. 237 mL 12  . Ferrous Sulfate  (IRON) 325 (65 FE) MG TABS Take 325 mg by mouth 3 (three) times daily. 90 each 2  . fish oil-omega-3 fatty acids 1000 MG capsule Take 1 g by mouth 3 (three) times daily. Three times daily    . furosemide (LASIX) 20 MG tablet Take 1 tablet (20 mg total) by mouth daily. 30 tablet 0  . guaiFENesin (MUCINEX) 600 MG 12  hr tablet Take 1 tablet (600 mg total) by mouth 2 (two) times daily. 30 tablet 0  . hydrocortisone 2.5 % lotion Apply topically 2 (two) times daily. 59 mL 2  . insulin aspart (NOVOLOG) 100 UNIT/ML injection Before each meal 3 times a day, 140-199 - 2 units, 200-250 - 4 units, 251-299 - 6 units,  300-349 - 8 units,  350 or above 10 units. Dispense syringes and needles as needed, Ok to switch to PEN if approved. Substitute to any brand approved. DX DM2, Code E11.65 1 vial 12  . isosorbide mononitrate (IMDUR) 30 MG 24 hr tablet Take 1 tablet (30 mg total) by mouth daily. 30 tablet 2  . mometasone-formoterol (DULERA) 100-5 MCG/ACT AERO Inhale 1 puff into the lungs daily. 1 Inhaler 2  . nitroGLYCERIN (NITROSTAT) 0.4 MG SL tablet Place 1 tablet (0.4 mg total) under the tongue every 5 (five) minutes as needed for chest pain. 10 tablet 0  . sertraline (ZOLOFT) 50 MG tablet Take 1 tablet (50 mg total) by mouth daily. 30 tablet 3  . sodium bicarbonate 650 MG tablet Take 1 tablet (650 mg total) by mouth 2 (two) times daily. 30 tablet 0  . tamsulosin (FLOMAX) 0.4 MG CAPS capsule Take 1 capsule (0.4 mg total) by mouth daily. 30 capsule     Results for orders placed or performed during the hospital encounter of 03/29/15 (from the past 48 hour(s))  Glucose, capillary     Status: Abnormal   Collection Time: 04/04/15  4:28 PM  Result Value Ref Range   Glucose-Capillary 114 (H) 65 - 99 mg/dL  Glucose, capillary     Status: None   Collection Time: 04/04/15 11:38 PM  Result Value Ref Range   Glucose-Capillary 96 65 - 99 mg/dL  CBC with Differential/Platelet     Status: Abnormal   Collection Time:  04/05/15  6:12 AM  Result Value Ref Range   WBC 12.9 (H) 4.0 - 10.5 K/uL   RBC 3.31 (L) 3.87 - 5.11 MIL/uL   Hemoglobin 9.4 (L) 12.0 - 15.0 g/dL   HCT 28.8 (L) 36.0 - 46.0 %   MCV 87.0 78.0 - 100.0 fL   MCH 28.4 26.0 - 34.0 pg   MCHC 32.6 30.0 - 36.0 g/dL   RDW 16.3 (H) 11.5 - 15.5 %   Platelets 341 150 - 400 K/uL   Neutrophils Relative % 82 %   Neutro Abs 10.7 (H) 1.7 - 7.7 K/uL   Lymphocytes Relative 9 %   Lymphs Abs 1.1 0.7 - 4.0 K/uL   Monocytes Relative 7 %   Monocytes Absolute 0.8 0.1 - 1.0 K/uL   Eosinophils Relative 1 %   Eosinophils Absolute 0.2 0.0 - 0.7 K/uL   Basophils Relative 1 %   Basophils Absolute 0.1 0.0 - 0.1 K/uL  Basic metabolic panel     Status: Abnormal   Collection Time: 04/05/15  6:12 AM  Result Value Ref Range   Sodium 143 135 - 145 mmol/L   Potassium 3.8 3.5 - 5.1 mmol/L   Chloride 109 101 - 111 mmol/L   CO2 16 (L) 22 - 32 mmol/L   Glucose, Bld 77 65 - 99 mg/dL   BUN 33 (H) 6 - 20 mg/dL   Creatinine, Ser 2.51 (H) 0.44 - 1.00 mg/dL   Calcium 6.6 (L) 8.9 - 10.3 mg/dL   GFR calc non Af Amer 19 (L) >60 mL/min   GFR calc Af Amer 22 (L) >60 mL/min    Comment: (  NOTE) The eGFR has been calculated using the CKD EPI equation. This calculation has not been validated in all clinical situations. eGFR's persistently <60 mL/min signify possible Chronic Kidney Disease.    Anion gap 18 (H) 5 - 15  Glucose, capillary     Status: None   Collection Time: 04/05/15  7:57 AM  Result Value Ref Range   Glucose-Capillary 87 65 - 99 mg/dL  Glucose, capillary     Status: Abnormal   Collection Time: 04/05/15 11:47 AM  Result Value Ref Range   Glucose-Capillary 101 (H) 65 - 99 mg/dL  Glucose, capillary     Status: Abnormal   Collection Time: 04/05/15  4:36 PM  Result Value Ref Range   Glucose-Capillary 141 (H) 65 - 99 mg/dL  Glucose, capillary     Status: Abnormal   Collection Time: 04/05/15 10:05 PM  Result Value Ref Range   Glucose-Capillary 204 (H) 65 - 99  mg/dL  Glucose, capillary     Status: None   Collection Time: 04/06/15  8:36 AM  Result Value Ref Range   Glucose-Capillary 93 65 - 99 mg/dL   Comment 1 Notify RN    Comment 2 Document in Chart   Magnesium     Status: None   Collection Time: 04/06/15 11:19 AM  Result Value Ref Range   Magnesium 2.0 1.7 - 2.4 mg/dL  Renal function panel     Status: Abnormal   Collection Time: 04/06/15 11:19 AM  Result Value Ref Range   Sodium 142 135 - 145 mmol/L   Potassium 3.5 3.5 - 5.1 mmol/L   Chloride 104 101 - 111 mmol/L   CO2 22 22 - 32 mmol/L   Glucose, Bld 159 (H) 65 - 99 mg/dL   BUN 31 (H) 6 - 20 mg/dL   Creatinine, Ser 2.61 (H) 0.44 - 1.00 mg/dL   Calcium 7.6 (L) 8.9 - 10.3 mg/dL   Phosphorus 3.2 2.5 - 4.6 mg/dL   Albumin 1.5 (L) 3.5 - 5.0 g/dL   GFR calc non Af Amer 18 (L) >60 mL/min   GFR calc Af Amer 21 (L) >60 mL/min    Comment: (NOTE) The eGFR has been calculated using the CKD EPI equation. This calculation has not been validated in all clinical situations. eGFR's persistently <60 mL/min signify possible Chronic Kidney Disease.    Anion gap 16 (H) 5 - 15  Glucose, capillary     Status: Abnormal   Collection Time: 04/06/15 11:41 AM  Result Value Ref Range   Glucose-Capillary 140 (H) 65 - 99 mg/dL   No results found.  Review of Systems  Constitutional: Positive for malaise/fatigue. Negative for fever, chills, weight loss and diaphoresis.  HENT: Negative for congestion and sore throat.   Eyes: Negative for blurred vision.  Respiratory: Positive for cough, sputum production and shortness of breath. Negative for hemoptysis and wheezing.   Cardiovascular: Negative for chest pain, palpitations and leg swelling.  Gastrointestinal: Positive for diarrhea. Negative for heartburn, nausea, vomiting and abdominal pain.  Genitourinary: Negative for dysuria, urgency, frequency, hematuria and flank pain.  Musculoskeletal: Negative for myalgias, back pain and joint pain.  Skin: Negative  for rash.  Neurological: Negative for headaches.  Endo/Heme/Allergies: Does not bruise/bleed easily.  Psychiatric/Behavioral: Positive for memory loss. Negative for depression.    Blood pressure 146/45, pulse 74, temperature 97.8 F (36.6 C), temperature source Oral, resp. rate 18, height _0  (1.473 m), weight 139 lb 12.4 oz (63.4 kg), SpO2 95 %. Physical Exam  Constitutional: She is oriented to person, place, and time. No distress.  Appears older than her stated age.  HENT:  Head: Normocephalic and atraumatic.  Mouth/Throat: Oropharynx is clear and moist. No oropharyngeal exudate.  Eyes: Conjunctivae are normal. Pupils are equal, round, and reactive to light. Right eye exhibits no discharge. Left eye exhibits no discharge. No scleral icterus.  Neck: Neck supple. No JVD present.  Cardiovascular: Normal rate and regular rhythm.  Exam reveals no gallop and no friction rub.   No murmur heard. Respiratory: No stridor. No respiratory distress. She has no wheezes. She has rales. She exhibits no tenderness.  Bibasilar crackles.  GI: Soft. Bowel sounds are normal. She exhibits no distension. There is no tenderness. There is no rebound and no guarding.  Musculoskeletal: She exhibits no edema.  Lymphadenopathy:    She has no cervical adenopathy.  Neurological: She is alert and oriented to person, place, and time.  Skin: Skin is warm and dry. No rash noted. She is not diaphoretic.  Psychiatric: She has a normal mood and affect.      Duplex 2014: no obvious evidence of hemodynamically significant stenosis >60%, bilateral intrarenal arteries exhibit absent diastolic flow. There is evidence of elevated velocities of the mid aorta. 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 Does not appear volume overloaded. Electrolytes stable. Metabolic acidosis appears to have resolved with  sodium bicarb PO.   Metabolic acidosis: Currently on sodium bicarbonate 632m BID. Unsure why there was a drop in the bicarb level on 12/12, I suspect some sort of lab error. Currently at goal.  - continue  sodium bicarb BID.   Metabolic bone disease: Patient's PTH on 1/12 was 9, on repeat on 1/27 it was 97. Her PTHrp was <1.1. Phosphorus was at goal at 3.5. I would not suggest giving calcitriol in an attempt to improve calcium at this point given the patient was in range with a PTH between 70-110. Will get a renal function panel to assess phosphorus tomorrow AM.   Hypocalcemia: initially treated for hypercalcemia with Zometa and calcitonin, now hypocalcemic. Most likely from vitamin D deficiency. Possibly some component of ingestion (a whole bottle of Tums) when she was initially hypercalcemic. Vitamin D level on 1/13 < 5 and repeat vitamin D level on 1/27 after supplementation was 7.2. Received vitamin D 50,000 units 1 and started 1000 units daily. Corrected Ca today was 9.2. Patient received Zometa on 1/12. Typically this lasts for approximately 3 weeks, however with the patient's poor renal function it may be lasting longer than expected given it is approximately 40% renally excreted. She is currently on Oscal 5035mTID.  - continue Oscal 50020mID - hold off on calcitriol for now - start vitamin D-1,25 50000IU q week.  - if this is in fact secondary to delayed excretion of Zometa and calcium rebounds to be hypercalcemic again, would consider malignancy work up.   Chronic diastolic heart failure- Evolemic on exam. Last echo 04/03/15 with EF 55-60% and grade 2 diastolic dysfunction.   Anemia of chronic kidney disease and iron deficiency: Hgb stable around 9. On ferrous sulfate 325m49mD currently. Last %sat was 5 with a ferritin of 360 and an iron of 9. Could consider IV iron for better absorption once not acutely infected.  She was also started on Aranesp on 2/12 as well.   MSSA bacteremia: per  primary team and ID.  On cefazolin currently. No PICC line. Place tunneled IJ instead.   Lewy-body dementia: stable per husband. Currently oriented to person, place, time, and situation.   Kathrine Cords, MD [place] Resident, PGY-2 04/06/2015, 2:25 PM  Renal Attending: She has hypocalcemia in the setting of recent low levels of 1-25 and 25 Vitamin d levels.   I would supplement with 1-25 [place] vitamin D and calcium until replete. Erling Cruz, MD

## 2015-04-06 NOTE — Progress Notes (Signed)
PROGRESS NOTE    [name] [id number] DOB: May 07, 1950 DOA: 03/29/2015 PCP: Pcp Not In System  HPI/Brief narrative 65 year old female with multiple medical problems including but not limited to CKD stage IV, COPD on home oxygen when necessary, chronic diastolic CHF, hypertension, recent GI bleed, CAD, hyperlipidemia, diet-controlled diabetes mellitus presented with 2-3 day history of cough, dyspnea on exertion, and subjective fevers. Notably, the patient was recently discharged from the hospital on 03/26/2015 after treatment for upper GI bleed. She underwent endoscopy (Dr. Wilfrid Lund III) which did not show evidence of active bleeding but there was a suspected distal intestinal ulcer or AVM. She was also seen by cardiology secondary to chest discomfort and elevated troponins. The patient had a Myoview on 01/22/2015 with intermediate risk, EF 52% They did not feel that she was a candidate for open heart surgery or cardiac catheterization or percutaneous intervention given her bleeding risk and renal failure. She was to be managed medically from a cardiac standpoint. Cardiology felt that she could be continued with clopidogrel monotherapy after discharge. At the time of this admission, the patient was noted to be hypocalcemic with a corrected calcium of 6.8. The patient was recently treated for hypercalcemia with intravenous pamidronate which was thought to be due to dehydration and calcium supplementation (overuse of Tums).   Assessment/Plan:   1. Healthcare associated pneumonia with MSSA bacteremia: Treated empirically with IV cefepime and vancomycin. CT chest without contrast: Showed bilateral nodular masslike opacities right >left with mediastinal lymphadenopathy. Treating for likely infectious etiology. ID input appreciated. Given Staphaureus bacteremia, pneumonia may be same. Antibiotics transitioned to IV cefazolin. Discussed with Dr. Baxter Flattery, ID on 2/12: CT findings concerning for  possible septic embolization versus malignancy versus opportunistic infection. Repeat chest x-ray shows persisting nodular ASD of right lung and feel some form of sampling of right lung for infectious versus malignant etiology is appropriate. Pulmonology consultation appreciated: Patient is on Plavix and at increase risk of bleeding. Hence recommend holding off on bronchoscopy and biopsy unless he shouldn't worsens or chest CT does not get better. ID recommends total 4 weeks antibiotics (weekly CBCs and BMP twice a week while on IV antibiotics), then repeat CT chest at end of antibiotics and follow-up with pulmonology to decide if further evaluation needed. Currently day 9 of 28 cefazolin. 2. Acute on chronic respiratory failure with hypoxia: Likely secondary to pneumonia. Improving. 3. MSSA bacteremia: One of 2 blood cultures positive on admission. ID input appreciated. Surveillance blood cultures negative to date. Changed to IV Ancef. TTE does not show vegetations. ID recommends 4 weeks of cefazolin via tunneled IJ (nephrology recommends no PICC line). ID follow-up in 3 weeks  4. Acute on stage III-IV chronic kidney disease/renal artery stenosis/metabolic acidosis: Baseline creatinine 3-3 0.3. Metabolic acidosis has improved. Renal function back to baseline. Outpatient follow-up with Dr. Florene Glen, nephrology. Creatinine has improved to 2.5. Requested primary nephrologist to evaluate in house.  5. Recent upper GI bleed: Hemoglobin stable. Ferritin 360. Resolved. 6. Hypocalcemia/low vitamin D/recent hypercalcemia: Vitamin D level on 1/13 < 5 and repeat vitamin D level on 1/27 after supplementation was 7.2. Received vitamin D 50,000 units 1 and started 1000 units daily. Recommend repeating vitamin D level in next 4-6 weeks. 2 admissions ago, patient presented with hypercalcemia related to overuse of Tums, dehydration and acute renal failure. At that time she received calcitonin and IV pamidronate. During  subsequent admission, she was hypocalcemic and started on calcium supplements. No clinical manifestations of hypocalcemia. Corrected serum  calcium starting to rise again and due to prior concern for hypercalcemia, discussed with nephrologist on call and since she is asymptomatic, we will reduce her calcium supplements to 500 MG 3 times a day. Outpatient follow-up with nephrology regarding initiating calcitriol. 7. Hypomagnesemia: replaced. 8. Anemia of chronic kidney disease/iron deficiency and recent GI bleed: RBC folate 1457, iron saturation 5%, B12: 1290. Continue iron sulfate. Status post Aranesp. Hemoglobin stable. 9. COPD/chronic hypoxic respiratory failure: As per patient, utilizes oxygen only at night or when necessary. Stable. 10. Chronic diastolic CHF: Compensated. 11. Essential hypertension: Reasonable inpatient control. Continue amlodipine, carvedilol and Imdur. ARB discontinued during last admission due to CKD 12. Former tobacco abuse: Quit 3 months ago. 13. Hyperlipidemia: Continue Lipitor and omega-3 fatty acids 14. Diet-controlled DM 2: A1c: 6.2 15. CAD: Asymptomatic 68. Protein calorie malnutrition: Continue nutritional supplementations 17. Lewy body dementia without behavioral disturbance: Mental status at baseline 18. Sacral ulcer: Per [place] RN 19. Hypokalemia: Replaced 20. Adult failure to thrive: Patient has had 6 admissions in the last 6 months. Multifactorial due to severe comorbidities and age. Palliative care input appreciated. Transitioned to DO NOT RESUSCITATE but otherwise full treatment and patient/family hopeful for some sort of improvement.   DVT prophylaxis: SCDs Code Status: Changed to DO NOT RESUSCITATE on 04/05/15 Family Communication: None at bedside this morning.  Disposition Plan: DC home when medically stable. Possible DC home 2/14   Consultants:  Infectious disease  Pulmonology  Palliative care team  Procedures:  None  Antimicrobials:  IV  cefepime 2/5 > 2/8  IV vancomycin 2/5 > 2/8  IV cefazolin 2/8 >   Subjective: Denies complaints.  Objective: Filed Vitals:   04/06/15 0658 04/06/15 0906 04/06/15 0932 04/06/15 1650  BP: 142/50 146/45  141/48  Pulse: 73 74  71  Temp: 98.6 F (37 C) 97.8 F (36.6 C)  98.4 F (36.9 C)  TempSrc: Oral Oral  Oral  Resp: 18 18  20   Height:      Weight:      SpO2: 94% 96% 95% 98%    Intake/Output Summary (Last 24 hours) at 04/06/15 1752 Last data filed at 04/06/15 1409  Gross per 24 hour  Intake    340 ml  Output    226 ml  Net    114 ml   Filed Weights   04/01/15 0433 04/02/15 2041 04/03/15 2045  Weight: 63.3 kg (139 lb 8.8 oz) 63.2 kg (139 lb 5.3 oz) 63.4 kg (139 lb 12.4 oz)    Exam:  General exam: Small built and frail pleasant middle-aged female sitting up in bed this morning. Appears chronically ill-looking. Respiratory system: Clear. No increased work of breathing. Cardiovascular system: S1 & S2 heard, RRR. No JVD, murmurs, gallops, clicks or pedal edema.  Gastrointestinal system: Abdomen is nondistended, soft and nontender. Normal bowel sounds heard. Central nervous system: Alert and oriented. No focal neurological deficits. Extremities: Symmetric 5 x 5 power.   Data Reviewed: Basic Metabolic Panel:  Recent Labs Lab 03/31/15 0614  04/02/15 0746 04/03/15 0607 04/04/15 0440 04/04/15 1410 04/05/15 0612 04/06/15 1119  NA 142  < > 142 147* 141  --  143 142  K 3.9  < > 3.7 4.1 2.8*  --  3.8 3.5  CL 107  < > 106 110 108  --  109 104  CO2 19*  < > 21* 21* 21*  --  16* 22  GLUCOSE 84  < > 91 60* 87  --  77 159*  BUN 49*  < > 45* 42* 36*  --  33* 31*  CREATININE 3.36*  < > 2.98* 2.90* 2.68*  --  2.51* 2.61*  CALCIUM 5.7*  < > 6.1* 6.4* 6.2*  --  6.6* 7.6*  MG 1.7  --   --   --   --  1.4*  --  2.0  PHOS  --   --  2.5 3.5  --   --   --  3.2  < > = values in this interval not displayed. Liver Function Tests:  Recent Labs Lab 04/02/15 0746 04/03/15 0607  04/06/15 1119  ALBUMIN 1.8* 1.6* 1.5*   No results for input(s): LIPASE, AMYLASE in the last 168 hours. No results for input(s): AMMONIA in the last 168 hours. CBC:  Recent Labs Lab 03/31/15 0614 04/02/15 0746 04/04/15 1410 04/05/15 0612  WBC 8.9 11.9* 10.2 12.9*  NEUTROABS  --   --  9.0* 10.7*  HGB 8.7* 9.6* 9.0* 9.4*  HCT 26.1* 29.2* 27.4* 28.8*  MCV 88.5 88.8 89.0 87.0  PLT 254 341 337 341   Cardiac Enzymes: No results for input(s): CKTOTAL, CKMB, CKMBINDEX, TROPONINI in the last 168 hours. BNP (last 3 results) No results for input(s): PROBNP in the last 8760 hours. CBG:  Recent Labs Lab 04/05/15 1636 04/05/15 2205 04/06/15 0836 04/06/15 1141 04/06/15 1651  GLUCAP 141* 204* 93 140* 186*    Recent Results (from the past 240 hour(s))  Culture, blood (Routine X 2) w Reflex to ID Panel     Status: None   Collection Time: 03/29/15 12:40 PM  Result Value Ref Range Status   Specimen Description BLOOD RIGHT ANTECUBITAL  Final   Special Requests BOTTLES DRAWN AEROBIC AND ANAEROBIC 4MLS  Final   Culture NO GROWTH 5 DAYS  Final   Report Status 04/03/2015 FINAL  Final  Culture, blood (Routine X 2) w Reflex to ID Panel     Status: None   Collection Time: 03/29/15 12:50 PM  Result Value Ref Range Status   Specimen Description BLOOD LEFT ANTECUBITAL  Final   Special Requests BOTTLES DRAWN AEROBIC AND ANAEROBIC 5CCS  Final   Culture  Setup Time   Final    GRAM POSITIVE COCCI IN CLUSTERS ANAEROBIC BOTTLE ONLY CRITICAL RESULT CALLED TO, READ BACK BY AND VERIFIED WITH: ANITE,RN ON 6E @0437  03/31/15 MKELLY    Culture STAPHYLOCOCCUS AUREUS  Final   Report Status 04/01/2015 FINAL  Final   Organism ID, Bacteria STAPHYLOCOCCUS AUREUS  Final      Susceptibility   Staphylococcus aureus - MIC*    CIPROFLOXACIN <=0.5 SENSITIVE Sensitive     ERYTHROMYCIN <=0.25 SENSITIVE Sensitive     GENTAMICIN <=0.5 SENSITIVE Sensitive     OXACILLIN 0.5 SENSITIVE Sensitive     TETRACYCLINE <=1  SENSITIVE Sensitive     VANCOMYCIN <=0.5 SENSITIVE Sensitive     TRIMETH/SULFA <=10 SENSITIVE Sensitive     CLINDAMYCIN <=0.25 SENSITIVE Sensitive     RIFAMPIN <=0.5 SENSITIVE Sensitive     Inducible Clindamycin NEGATIVE Sensitive     * STAPHYLOCOCCUS AUREUS  Culture, blood (routine x 2)     Status: None   Collection Time: 03/31/15  7:20 PM  Result Value Ref Range Status   Specimen Description BLOOD LEFT ANTECUBITAL  Final   Special Requests BOTTLES DRAWN AEROBIC AND ANAEROBIC 5CC   Final   Culture NO GROWTH 5 DAYS  Final   Report Status 04/05/2015 FINAL  Final  Culture, blood (routine x 2)  Status: None   Collection Time: 03/31/15  7:30 PM  Result Value Ref Range Status   Specimen Description BLOOD RIGHT ANTECUBITAL  Final   Special Requests BOTTLES DRAWN AEROBIC AND ANAEROBIC 5CC   Final   Culture NO GROWTH 5 DAYS  Final   Report Status 04/05/2015 FINAL  Final         Studies: Ir Fluoro Guide Cv Line Right  04/06/2015  INDICATION: 65 year old with chronic kidney disease and bacteremia. Request for tunneled central venous catheter for antibiotics. EXAM: FLUOROSCOPIC AND ULTRASOUND GUIDED PLACEMENT OF A TUNNELED CENTRAL VENOUS CATHETER Physician: Stephan Minister. Anselm Pancoast, MD FLUOROSCOPY TIME:  12 seconds, 0.2 mGy MEDICATIONS: Patient is already on antibiotics. ANESTHESIA/SEDATION: None PROCEDURE: Informed consent was obtained for placement of a tunneled central venous catheter. The patient was placed supine on the interventional table. Ultrasound confirmed a patent right internal jugularvein. Ultrasound images were obtained for documentation. The right side of the neck was prepped and draped in a sterile fashion. The right side of the neck was anesthetized with 1% lidocaine. Maximal barrier sterile technique was utilized including caps, mask, sterile gowns, sterile gloves, sterile drape, hand hygiene and skin antiseptic. A small incision was made with #11 blade scalpel. A 21 gauge needle directed  into the right internal jugular vein with ultrasound guidance. A micropuncture dilator set was placed. A single lumen Powerline catheter was selected. The skin below the right clavicle was anesthetized and a small incision was made with an #11 blade scalpel. A subcutaneous tunnel was formed to the vein dermatotomy site. The catheter was brought through the tunnel. The vein dermatotomy site was dilated to accommodate a peel-away sheath. The catheter was placed through the peel-away sheath and directed into the central venous structures. The tip of the catheter was placed at the superior cavoatrial junction with fluoroscopy. Fluoroscopic images were obtained for documentation. Both lumens were found to aspirate and flush well. The vein dermatotomy site was closed using a single layer of absorbable suture and Dermabond. The catheter was secured to the skin using Prolene suture. FINDINGS: Catheter tip at the superior cavoatrial junction. COMPLICATIONS: None IMPRESSION: Successful placement of a right jugular tunneled central venous catheter using ultrasound and fluoroscopic guidance. Electronically Signed   By: Markus Daft M.D.   On: 04/06/2015 17:08   Ir US Guide Vasc Access Right  04/06/2015  INDICATION: 65 year old with chronic kidney disease and bacteremia. Request for tunneled central venous catheter for antibiotics. EXAM: FLUOROSCOPIC AND ULTRASOUND GUIDED PLACEMENT OF A TUNNELED CENTRAL VENOUS CATHETER Physician: Stephan Minister. Anselm Pancoast, MD FLUOROSCOPY TIME:  12 seconds, 0.2 mGy MEDICATIONS: Patient is already on antibiotics. ANESTHESIA/SEDATION: None PROCEDURE: Informed consent was obtained for placement of a tunneled central venous catheter. The patient was placed supine on the interventional table. Ultrasound confirmed a patent right internal jugularvein. Ultrasound images were obtained for documentation. The right side of the neck was prepped and draped in a sterile fashion. The right side of the neck was anesthetized  with 1% lidocaine. Maximal barrier sterile technique was utilized including caps, mask, sterile gowns, sterile gloves, sterile drape, hand hygiene and skin antiseptic. A small incision was made with #11 blade scalpel. A 21 gauge needle directed into the right internal jugular vein with ultrasound guidance. A micropuncture dilator set was placed. A single lumen Powerline catheter was selected. The skin below the right clavicle was anesthetized and a small incision was made with an #11 blade scalpel. A subcutaneous tunnel was formed to the vein dermatotomy site. The  catheter was brought through the tunnel. The vein dermatotomy site was dilated to accommodate a peel-away sheath. The catheter was placed through the peel-away sheath and directed into the central venous structures. The tip of the catheter was placed at the superior cavoatrial junction with fluoroscopy. Fluoroscopic images were obtained for documentation. Both lumens were found to aspirate and flush well. The vein dermatotomy site was closed using a single layer of absorbable suture and Dermabond. The catheter was secured to the skin using Prolene suture. FINDINGS: Catheter tip at the superior cavoatrial junction. COMPLICATIONS: None IMPRESSION: Successful placement of a right jugular tunneled central venous catheter using ultrasound and fluoroscopic guidance. Electronically Signed   By: Markus Daft M.D.   On: 04/06/2015 17:08        Scheduled Meds: . amLODipine  5 mg Oral Daily  . atorvastatin  80 mg Oral q1800  . calcium carbonate  1 tablet Oral TID WC  . carvedilol  3.125 mg Oral BID WC  .  ceFAZolin (ANCEF) IV  2 g Intravenous Q12H  . cholecalciferol  1,000 Units Oral Daily  . clopidogrel  75 mg Oral Daily  . collagenase   Topical Daily  . darbepoetin (ARANESP) injection - NON-DIALYSIS  40 mcg Subcutaneous Q Sun-1800  . famotidine  20 mg Oral Daily  . feeding supplement (NEPRO CARB STEADY)  237 mL Oral BID BM  . ferrous sulfate  325  mg Oral TID WC  . furosemide  20 mg Oral Daily  . guaiFENesin  600 mg Oral BID  . isosorbide mononitrate  30 mg Oral Daily  . lidocaine      . mometasone-formoterol  1 puff Inhalation Daily  . omega-3 acid ethyl esters  1 g Oral TID  . sertraline  50 mg Oral Daily  . sodium bicarbonate  650 mg Oral BID  . sodium chloride flush  3 mL Intravenous Q12H  . tamsulosin  0.4 mg Oral Daily   Continuous Infusions:   Principal Problem:   HCAP (healthcare-associated pneumonia) Active Problems:   Hyperlipidemia   Anemia, chronic disease   Chronic obstructive pulmonary disease (HCC)   Renal artery stenosis (HCC)   Peripheral vascular disease (HCC)   Upper GI bleed from jejunum, AVM vs. Dieaulafoy's lesion   Diet-controlled diabetes mellitus (HCC)   Iron deficiency anemia   On home oxygen therapy   CKD (chronic kidney disease), stage IV (HCC)   CAD (coronary artery disease)   Chronic diastolic CHF (congestive heart failure) ([place])   Essential hypertension   Former tobacco use   Protein-calorie malnutrition ([place])   Lewy body dementia without behavioral disturbance   Hypocalcemia   Sacral ulcer   Acute respiratory failure (HCC)   Hypomagnesemia   Low serum vitamin D   Metabolic acidosis   Malnutrition of moderate degree   Palliative care encounter    Time spent: 25 minutes.    Vernell Leep, MD, FACP, FHM. [facility] Pager [phone]  If 7PM-7AM, please contact night-coverage [email or web address] Password [email or web address] 04/06/2015, 5:52 PM    LOS: 8 days

## 2015-04-06 NOTE — Progress Notes (Signed)
Name: [name] MRN: [id number] DOB: 1950/05/10    ADMISSION DATE:  03/29/2015 CONSULTATION DATE:    REFERRING MD :  Hongalgi  CHIEF COMPLAINT:  SOB and cough  BRIEF PATIENT DESCRIPTION:  26 yoF recently quit smoking, admitted for sob and cough. 6 admissions in 6 months, most recently w GIB. Treated for HCAP initially with vanc/ceph. Now on cefazolin per ID after blood cx MSSA. Subsequent CT and CXR show nodular infiltrates, R>L, lymphadenopathy. PCCM is asked about sampling. Complicating co-morbidities incl COPD/home O2, CAD, dCHF, CKD IV w anemia, jejunal GIB.   SIGNIFICANT EVENTS  Pt feels better overall. No fevers. Less cough and SOB.  No issues over the weekend.   STUDIES:    HISTORY OF PRESENT ILLNESS:    22 yoF recently quit smoking, admitted for sob and cough. 6 admissions in 6 months, most recently w GIB. Treated for HCAP initially with vanc/ceph. Now on cefazolin per ID after blood cx MSSA. Subsequent CT and CXR show nodular infiltrates, R>L, lymphadenopathy. PCCM is asked about sampling. Complicating co-morbidities incl COPD/home O2, CAD, dCHF, CKD IV w anemia, jejunal GIB.  Pt has been on Cefazolin per ID. D4.   PAST MEDICAL HISTORY :   has a past medical history of Peripheral vascular disease ([place]); Diabetes mellitus; COPD (chronic obstructive pulmonary disease) ([place]); Hyperlipidemia; Depression; Chronic diastolic CHF (congestive heart failure) ([place]); Essential hypertension; Renal artery stenosis (H[facility]); CAD (coronary artery disease); Iron deficiency anemia; Diabetic retinopathy ([place]); GI AVM (gastrointestinal arteriovenous vascular malformation); Upper GI bleed from jejunum, AVM vs. Dieaulafoy's lesion (01/25/2012); Carotid artery disease ([place]); Hypertensive heart disease with congestive heart failure ([place]); CKD (chronic kidney disease), stage III; Tobacco abuse; Bradycardia; Anemia, chronic disease (05/16/2008); Dementia; and Protein-calorie malnutrition ([place])  (01/24/2015).  has past surgical history that includes Carotid endarterectomy; Cholecystectomy; Appendectomy; Tonsillectomy; Cesarean section; Shoulder arthroscopy; Iliac artery stent; enteroscopy (01/26/2012); Cardiac catheterization (N/A, 01/23/2015); and enteroscopy (N/A, 03/25/2015). Prior to Admission medications   Medication Sig Start Date End Date Taking? Authorizing Provider  albuterol (PROVENTIL HFA;VENTOLIN HFA) 108 (90 BASE) MCG/ACT inhaler Inhale 2 puffs into the lungs every 6 (six) hours as needed for wheezing or shortness of breath. 02/17/15  Yes Arnoldo Morale, MD  amLODipine (NORVASC) 10 MG tablet Take 0.5 tablets (5 mg total) by mouth daily. 03/26/15  Yes Lavina Hamman, MD  aspirin EC 81 MG tablet Take 1 tablet (81 mg total) by mouth daily. 02/17/15  Yes Arnoldo Morale, MD  atorvastatin (LIPITOR) 80 MG tablet Take 1 tablet (80 mg total) by mouth daily at 6 PM. 02/17/15  Yes Arnoldo Morale, MD  benzonatate (TESSALON) 100 MG capsule Take 1 capsule (100 mg total) by mouth 3 (three) times daily as needed for cough. 03/26/15  Yes Lavina Hamman, MD  calcium-vitamin D 250-100 MG-UNIT tablet Take 1 tablet by mouth daily. 03/26/15  Yes Lavina Hamman, MD  carvedilol (COREG) 3.125 MG tablet Take 1 tablet (3.125 mg total) by mouth 2 (two) times daily with a meal. 03/12/15  Yes Ripudeep K Rai, MD  clopidogrel (PLAVIX) 75 MG tablet Take 1 tablet (75 mg total) by mouth daily. 02/17/15  Yes Arnoldo Morale, MD  famotidine (PEPCID) 20 MG tablet Take 1 tablet (20 mg total) by mouth daily. 03/12/15  Yes Ripudeep Krystal Eaton, MD  feeding supplement, ENSURE ENLIVE, (ENSURE ENLIVE) LIQD Take 237 mLs by mouth daily. 03/26/15  Yes Lavina Hamman, MD  Ferrous Sulfate (IRON) 325 (65 FE) MG TABS Take 325 mg by  mouth 3 (three) times daily. 02/17/15  Yes Arnoldo Morale, MD  fish oil-omega-3 fatty acids 1000 MG capsule Take 1 g by mouth 3 (three) times daily. Three times daily   Yes Historical Provider, MD  furosemide (LASIX) 20 MG tablet  Take 1 tablet (20 mg total) by mouth daily. 03/26/15  Yes Lavina Hamman, MD  guaiFENesin (MUCINEX) 600 MG 12 hr tablet Take 1 tablet (600 mg total) by mouth 2 (two) times daily. 03/26/15  Yes Lavina Hamman, MD  hydrocortisone 2.5 % lotion Apply topically 2 (two) times daily. 01/08/15  Yes Dorena Dew, FNP  insulin aspart (NOVOLOG) 100 UNIT/ML injection Before each meal 3 times a day, 140-199 - 2 units, 200-250 - 4 units, 251-299 - 6 units,  300-349 - 8 units,  350 or above 10 units. Dispense syringes and needles as needed, Ok to switch to PEN if approved. Substitute to any brand approved. DX DM2, Code E11.65 03/09/15  Yes Thurnell Lose, MD  isosorbide mononitrate (IMDUR) 30 MG 24 hr tablet Take 1 tablet (30 mg total) by mouth daily. 02/17/15  Yes Arnoldo Morale, MD  mometasone-formoterol (DULERA) 100-5 MCG/ACT AERO Inhale 1 puff into the lungs daily. 03/27/15  Yes Arnoldo Morale, MD  nitroGLYCERIN (NITROSTAT) 0.4 MG SL tablet Place 1 tablet (0.4 mg total) under the tongue every 5 (five) minutes as needed for chest pain. 01/28/15  Yes Delfina Redwood, MD  sertraline (ZOLOFT) 50 MG tablet Take 1 tablet (50 mg total) by mouth daily. 02/04/15  Yes Arnoldo Morale, MD  sodium bicarbonate 650 MG tablet Take 1 tablet (650 mg total) by mouth 2 (two) times daily. 03/26/15  Yes Lavina Hamman, MD  tamsulosin (FLOMAX) 0.4 MG CAPS capsule Take 1 capsule (0.4 mg total) by mouth daily. 03/12/15  Yes Ripudeep Krystal Eaton, MD   No Known Allergies  FAMILY HISTORY:  family history includes Angina in her father; Cancer in her brother and sister; Heart attack in her mother; Hypertension in her father and mother. There is no history of Stroke. SOCIAL HISTORY:  reports that she quit smoking about 7 weeks ago. Her smoking use included Cigarettes. She smoked 0.00 packs per day for 36 years. She has never used smokeless tobacco. She reports that she does not drink alcohol or use illicit drugs.  REVIEW OF SYSTEMS:   Constitutional:  Negative for fever, chills, weight loss, malaise/fatigue and diaphoresis.  HENT: Negative for hearing loss, ear pain, nosebleeds, congestion, sore throat, neck pain, tinnitus and ear discharge.   Eyes: Negative for blurred vision, double vision, photophobia, pain, discharge and redness.  Respiratory: (+) cough and SOB but better. Less wheeze.  Cardiovascular: Negative for chest pain, palpitations, orthopnea, claudication, leg swelling and PND.  Gastrointestinal: Negative for heartburn, nausea, vomiting, abdominal pain, diarrhea, constipation, blood in stool and melena.  Genitourinary: Negative for dysuria, urgency, frequency, hematuria and flank pain.  Musculoskeletal: Negative for myalgias, back pain, joint pain and falls.  Skin: Negative for itching and rash.  Neurological: Negative for dizziness, tingling, tremors, sensory change, speech change, focal weakness, seizures, loss of consciousness, weakness and headaches.  Endo/Heme/Allergies: Negative for environmental allergies and polydipsia. Does not bruise/bleed easily.  SUBJECTIVE:   VITAL SIGNS: Temp:  [98 F (36.7 C)-98.6 F (37 C)] 98.6 F (37 C) (02/13 0658) Pulse Rate:  [73] 73 (02/13 0658) Resp:  [18-20] 18 (02/13 0658) BP: (142-151)/(50) 142/50 mmHg (02/13 0658) SpO2:  [94 %-98 %] 95 % (02/13 0932)  Vitals:  Filed  Vitals:   04/05/15 0758 04/05/15 2000 04/06/15 0658 04/06/15 0932  BP: 148/39 151/50 142/50   Pulse: 69 73 73   Temp: 98 F (36.7 C) 98 F (36.7 C) 98.6 F (37 C)   TempSrc: Oral Oral Oral   Resp: 18 20 18    Height:      Weight:      SpO2: 91% 98% 94% 95%    Constitutional/General:  Pleasant, well-nourished, well-developed, not in any distress,  Comfortably seating.  Well kempt  Body mass index is 29.22 kg/(m^2). Wt Readings from Last 3 Encounters:  04/03/15 139 lb 12.4 oz (63.4 kg)  03/26/15 127 lb 13.9 oz (58 kg)  03/12/15 132 lb (59.875 kg)    Neck circumference:   HEENT: Pupils equal and  reactive to light and accommodation. Anicteric sclerae. Normal nasal mucosa.   No oral  lesions,  mouth clear,  oropharynx clear, no postnasal drip. (-) Oral thrush. No dental caries.  Airway - Mallampati class III  Neck: No masses. Midline trachea. No JVD, (-) LAD. (-) bruits appreciated.  Respiratory/Chest: Grossly normal chest. (-) deformity. (-) Accessory muscle use.  Symmetric expansion. (-) Tenderness on palpation.  Resonant on percussion.  Diminished BS on both lower lung zones. (-) wheezing, , rhonchi (+) crackles bibasilar (-) egophony  Cardiovascular: Regular rate and  rhythm, heart sounds normal, no murmur or gallops, no peripheral edema  Gastrointestinal:  Normal bowel sounds. Soft, non-tender. No hepatosplenomegaly.  (-) masses.   Musculoskeletal:  Normal muscle tone. Normal gait.   Extremities: Grossly normal. (-) clubbing, cyanosis.  (-) edema  Skin: (-) rash,lesions seen.   Neurological/Psychiatric : alert, oriented to time, place, person. Normal mood and affect   Recent Labs Lab 04/03/15 0607 04/04/15 0440 04/05/15 0612  NA 147* 141 143  K 4.1 2.8* 3.8  CL 110 108 109  CO2 21* 21* 16*  BUN 42* 36* 33*  CREATININE 2.90* 2.68* 2.51*  GLUCOSE 60* 87 77    Recent Labs Lab 04/02/15 0746 04/04/15 1410 04/05/15 0612  HGB 9.6* 9.0* 9.4*  HCT 29.2* 27.4* 28.8*  WBC 11.9* 10.2 12.9*  PLT 341 337 341   Dg Chest 2 View  04/04/2015  CLINICAL DATA:  Difficulty breathing the past few days. EXAM: CHEST  2 VIEW COMPARISON:  03/29/2015 FINDINGS: Persistent nodular airspace disease seen over the right upper and lower lobes. There is also a streaky lingular opacity. No evidence of progression by radiography. Stable cardiomegaly. Negative aortic and hilar contours. No effusion or air leak. IMPRESSION: Persisting nodular airspace disease over the right lung. Correlate with chest CT report 03/30/2015. Cardiomegaly without failure. Electronically Signed   By:  Monte Fantasia M.D.   On: 04/04/2015 13:12    ASSESSMENT / PLAN:  44F, former smoker, repeated admissions. Admitted for cough SOB and sputum production. Blood culture on admission with MSSA. On cefazolin D4. Chest CT scan with B nodular infiltrates. Rpt blood culture is (-). Over all fells better.  Plan: 1. Pt is on Plavix and has inc risk of bleeding. Will hold off on bronch and bx unless pt clinically worsens or chest ct scan does not get better. Pt at high risk for cx as well. I d/w pt and husband re: the plan to hold off on bronch unless necessary. Pt agree with the plan.  2. Cont cefazolin. ID following. 3. If for d/c -- pt will need a rpt chest ct scan in 3-4 weeks. I assume she will be on abx at  least a month. I will see her after chest ct scan and reassess the need for bronch.  4. Cont dulera. 5. DVT prophylaxis.   Tacey Ruiz, MD Pager [phone] [facility]monary and [place] Pager: [phone]  04/06/2015, 12:10 PM

## 2015-04-06 NOTE — Procedures (Signed)
Placement of right jugular tunneled central venous catheter.  Tip at SVC/RA junction.  Catheter is ready to be used.  No immediate complication.

## 2015-04-06 NOTE — Progress Notes (Signed)
Physical Therapy Treatment Patient Details Name: [name] MRN: [id number] DOB: 03/02/1950 Today's Date: 04/06/2015    History of Present Illness 65 y.o. female past medical history that includes type 2 diabetes, hypertension, COPD, diastolic heart failure, on home oxygen, chronic kidney disease stage III, dementia. Presetns with generalized weakness and AMS. Pt with encephalopathy.    PT Comments    Pt is getting up to walk with PT and has some minor unsteadiness due possibly from having central line today, just got back to her room.  Pt is motivated and was able to do there ex and gait with PT assist.  Will anticipate possible DC home with HHPT and PRN assist if further progress occurs.  Follow Up Recommendations  Supervision/Assistance - 24 hour;Home health PT     Equipment Recommendations  None recommended by PT    Recommendations for Other Services Rehab consult     Precautions / Restrictions Precautions Precautions: Fall Precaution Comments: monitor O2 with activity Restrictions Weight Bearing Restrictions: No    Mobility  Bed Mobility Overal bed mobility: Needs Assistance Bed Mobility: Supine to Sit Rolling: Min guard   Supine to sit: Min guard     General bed mobility comments: Extra time and minimal use of rail to rise to EOB.  Transfers Overall transfer level: Needs assistance Equipment used: Rolling walker (2 wheeled) Transfers: Sit to/from [facility] Sit to Stand: Supervision;Min guard Stand pivot transfers: Supervision;Min guard       General transfer comment: min guard as pt has just come back from putting in a central line  Ambulation/Gait Ambulation/Gait assistance: Min guard Ambulation Distance (Feet): 150 Feet Assistive device: Rolling walker (2 wheeled) Gait Pattern/deviations: Step-through pattern;Decreased stride length;Wide base of support;Drifts right/left Gait velocity: slow Gait velocity interpretation: Below  normal speed for age/gender     Stairs            Wheelchair Mobility    Modified Rankin (Stroke Patients Only)       Balance Overall balance assessment: Needs assistance Sitting-balance support: Feet supported Sitting balance-Leahy Scale: Fair   Postural control: Posterior lean Standing balance support: Bilateral upper extremity supported Standing balance-Leahy Scale: Fair                      Cognition Arousal/Alertness: Awake/alert Behavior During Therapy: WFL for tasks assessed/performed Overall Cognitive Status: Within Functional Limits for tasks assessed       Memory: Decreased short-term memory Following Commands: Follows multi-step commands inconsistently Safety/Judgement: Decreased awareness of safety Awareness: Intellectual Problem Solving: Slow processing;Difficulty sequencing General Comments: Pt alone when PT entered room    Exercises      General Comments General comments (skin integrity, edema, etc.): Pt was able to maneuver a walker with light assistance, mainly guarding her due to just having come back from installing a central line.      Pertinent Vitals/Pain Pain Assessment: No/denies pain Pain Location: none described    Home Living                      Prior Function            PT Goals (current goals can now be found in the care plan section) Acute Rehab PT Goals Patient Stated Goal: none stated PT Goal Formulation: With patient Time For Goal Achievement: 04/20/15 Potential to Achieve Goals: Good Progress towards PT goals: Progressing toward goals    Frequency  Min 3X/week    PT  Plan Current plan remains appropriate    Co-evaluation             End of Session Equipment Utilized During Treatment: Gait belt Activity Tolerance: Patient limited by fatigue Patient left: in chair;with call bell/phone within reach;with chair alarm set     Time: [id number] PT Time Calculation (min) (ACUTE ONLY): 16  min  Charges:  $Gait Training: 8-22 mins                    G Codes:      Ramond Dial 04/21/2015, 5:49 PM   Mee Hives, PT MS Acute Rehab Dept. Number: [phone] [phone] and [place] [phone]

## 2015-04-06 NOTE — Progress Notes (Signed)
[facility] for Infectious Disease    Date of Admission:  03/29/2015   Total days of antibiotics 9        Day 6 cefazolin           ID: [name] is a 65 y.o. female with HCAP, and staph aureus bacteremia Principal Problem:   HCAP (healthcare-associated pneumonia) Active Problems:   Hyperlipidemia   Anemia, chronic disease   Chronic obstructive pulmonary disease ([facility])   Renal artery stenosis ([facility])   Peripheral vascular disease ([place])   Upper GI bleed from jejunum, AVM vs. 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 Pulmonary has seen patient and felt to reassess with CT in 3-4 wk at completion of antibiotics  Medications:  . amLODipine  5 mg Oral Daily  . atorvastatin  80 mg Oral q1800  . calcium carbonate  1 tablet Oral TID WC  . carvedilol  3.125 mg Oral BID WC  .  ceFAZolin (ANCEF) IV  2 g Intravenous Q12H  . cholecalciferol  1,000 Units Oral Daily  . clopidogrel  75 mg Oral Daily  . collagenase   Topical Daily  . darbepoetin (ARANESP) injection - NON-DIALYSIS  40 mcg Subcutaneous Q Sun-1800  . famotidine  20 mg Oral Daily  . feeding supplement (NEPRO CARB STEADY)  237 mL Oral BID BM  . ferrous sulfate  325 mg Oral TID WC  . furosemide  20 mg Oral Daily  . guaiFENesin  600 mg Oral BID  . isosorbide mononitrate  30 mg Oral Daily  . lidocaine        . mometasone-formoterol  1 puff Inhalation Daily  . omega-3 acid ethyl esters  1 g Oral TID  . sertraline  50 mg Oral Daily  . sodium bicarbonate  650 mg Oral BID  . sodium chloride flush  3 mL Intravenous Q12H  . tamsulosin  0.4 mg Oral Daily    Objective: Vital signs in last 24 hours: Temp:  [97.8 F (36.6 C)-98.6 F (37 C)] 98.4 F (36.9 C) (02/13 1650) Pulse Rate:  [71-74] 71 (02/13 1650) Resp:  [18-20] 20 (02/13 1650) BP: (141-151)/(45-50) 141/48 mmHg (02/13 1650) SpO2:  [94 %-98 %] 98 % (02/13 1650) Physical Exam  Constitutional:  oriented to person, place, and time. Older than stated age, frail appearing. No distress.  HENT: [place]/AT, PERRLA, no scleral icterus Mouth/Throat: Oropharynx is clear and moist. No oropharyngeal exudate.  Cardiovascular: Normal rate, regular rhythm and normal heart sounds. Exam reveals no gallop and no friction rub.  No murmur heard.  Pulmonary/Chest: Effort normal and breath sounds normal. Expiratory rhonchi heard bilaterally at bases Neck = supple, no nuchal rigidity Abdominal: Soft. Bowel sounds are normal.  exhibits no distension. There is no tenderness.  Lymphadenopathy: no cervical adenopathy. No axillary adenopathy Neurological: alert and oriented to person, place, and  time.  Skin: Skin is warm and dry. No rash noted. No erythema.  Psychiatric: a normal mood and affect.  behavior is normal.   Lab Results  Recent Labs  04/04/15 1410 04/05/15 0612 04/06/15 1119  WBC 10.2 12.9*  --   HGB 9.0* 9.4*  --   HCT 27.4* 28.8*  --   NA  --  143 142  K  --  3.8 3.5  CL  --  109 104  CO2  --  16* 22  BUN  --  33* 31*  CREATININE  --  2.51* 2.61*   Liver Panel  Recent Labs  04/06/15 1119  ALBUMIN 1.5*    Microbiology: 2/5 blood cx 1 of 4 bottles MSSA 2/7 blood cx NGTD Studies/Results: Chest CT: IMPRESSION: 1. Multiple nodular and mass-like areas of apparent airspace consolidation, asymmetrically distributed in the lungs  bilaterally (right greater than left), which have an unusual appearance concerning for atypical infection. In particular, findings are concerning for potential fungal infection such as angioinvasive aspergillosis. 2. Associated with this there is mediastinal lymphadenopathy, as above. Although this could be reactive, the extensive lymphadenopathy is somewhat atypical for reactive lymphadenopathy, and accordingly, clinical correlation for signs and symptoms of lymphoproliferative disorder is recommended. 3. Atherosclerosis, including left main and 3 vessel coronary artery disease. Please note that although the presence of coronary artery calcium documents the presence of coronary artery disease, the severity of this disease and any potential stenosis cannot be assessed on this non-gated CT examination. Assessment for potential risk factor modification, dietary therapy or pharmacologic therapy may be warranted, if clinically indicated . 2/10 TTE : no vegetation Study Conclusions  - Left ventricle: The cavity size was normal. Wall thickness was normal. Systolic function was normal. The estimated ejection fraction was in the range of 55% to 60%. Wall motion was normal; there were no regional wall motion abnormalities. Features are consistent with a pseudonormal left ventricular filling pattern, with concomitant abnormal relaxation and increased filling pressure (grade 2 diastolic dysfunction). - Mitral valve: Calcified annulus. Mildly thickened leaflets . Mild thickening of the anterior leaflet and posterior leaflet, involving the leaflet margin more than the base, possibly a sign of previous rheumatic disease. Mobility of the posterior leaflet was mildly restricted. Diastolic leaflet doming was present. There was moderate regurgitation directed centrally. - Left atrium: The atrium was mildly dilated. - Pulmonary arteries: Systolic pressure was moderately  increased. PA peak pressure: 62 mm Hg (S). - Pericardium, extracardiac: A trivial pericardial effusion was identified.  Assessment/Plan: HCAP with staph aureus bacteremia= currently on day 9 of IV abtx without much improvement in symptoms. Her CT is abit unusual findings, concerning for septic emboli possibly?, could this represent malignancy vs. Fungal infection.  Will recommend to treat for 4 wk of cefazolin via picc line. Will repeat CT at end of the course of treatment to see if need to pursue bronchoscopy at that time  Staph aureus bacteremia =  i suspect that it is legitimate and not a contaminant, related to pulmonary process. Will treat as complicated bacteremia with 28 days of tx. She is currently day 9 of 28  CKD = appears improving with cr 2.6 today  Leukocytosis = will repeat cbc with diff today since it appears it was trending upward   Will see her back in the ID clinic in  3 wk  Home health orders listed below ---------------------------------------------------------- Diagnosis: Staph aureus bacteremia  Culture Result: staph aureus  No Known Allergies  Discharge antibiotics:  Per pharmacy protocol  Cefazolin, renally dosed  Duration: 19 days End Date: march 4th   [place] Per Protocol:  Labs weekly while on IV antibiotics: __x CBC with differential _x_ bMP twice a week  - can pull picc line once it is finished  Fax weekly labs to [phone]  Clinic Follow Up Appt: [facility] in 3 wk with Joandry Slagter      [facility]ious [facility] Cell: [phone] Pager: [phone]  04/06/2015, 5:19 PM

## 2015-04-07 ENCOUNTER — Other Ambulatory Visit: Payer: Self-pay | Admitting: Pulmonary Disease

## 2015-04-07 DIAGNOSIS — Z87891 Personal history of nicotine dependence: Secondary | ICD-10-CM

## 2015-04-07 DIAGNOSIS — R0689 Other abnormalities of breathing: Secondary | ICD-10-CM | POA: Insufficient documentation

## 2015-04-07 DIAGNOSIS — D509 Iron deficiency anemia, unspecified: Secondary | ICD-10-CM

## 2015-04-07 DIAGNOSIS — Z8701 Personal history of pneumonia (recurrent): Secondary | ICD-10-CM

## 2015-04-07 DIAGNOSIS — E785 Hyperlipidemia, unspecified: Secondary | ICD-10-CM

## 2015-04-07 DIAGNOSIS — R918 Other nonspecific abnormal finding of lung field: Secondary | ICD-10-CM | POA: Insufficient documentation

## 2015-04-07 DIAGNOSIS — R7989 Other specified abnormal findings of blood chemistry: Secondary | ICD-10-CM

## 2015-04-07 DIAGNOSIS — R7881 Bacteremia: Secondary | ICD-10-CM | POA: Insufficient documentation

## 2015-04-07 DIAGNOSIS — J189 Pneumonia, unspecified organism: Secondary | ICD-10-CM

## 2015-04-07 DIAGNOSIS — I10 Essential (primary) hypertension: Secondary | ICD-10-CM

## 2015-04-07 LAB — RENAL FUNCTION PANEL
ALBUMIN: 1.3 g/dL — AB (ref 3.5–5.0)
ANION GAP: 12 (ref 5–15)
BUN: 33 mg/dL — ABNORMAL HIGH (ref 6–20)
CO2: 22 mmol/L (ref 22–32)
Calcium: 7.6 mg/dL — ABNORMAL LOW (ref 8.9–10.3)
Chloride: 107 mmol/L (ref 101–111)
Creatinine, Ser: 2.49 mg/dL — ABNORMAL HIGH (ref 0.44–1.00)
GFR calc Af Amer: 22 mL/min — ABNORMAL LOW (ref >60)
GFR, EST NON AFRICAN AMERICAN: 19 mL/min — AB (ref >60)
GLUCOSE: 96 mg/dL (ref 65–99)
PHOSPHORUS: 3.2 mg/dL (ref 2.5–4.6)
POTASSIUM: 3.2 mmol/L — AB (ref 3.5–5.1)
Sodium: 141 mmol/L (ref 135–145)

## 2015-04-07 LAB — GLUCOSE, CAPILLARY
GLUCOSE-CAPILLARY: 88 mg/dL (ref 65–99)
GLUCOSE-CAPILLARY: 199 mg/dL — AB (ref 65–99)
GLUCOSE-CAPILLARY: 208 mg/dL — AB (ref 65–99)
GLUCOSE-CAPILLARY: 171 mg/dL — AB (ref 65–99)

## 2015-04-07 MED ORDER — HEPARIN SOD (PORK) LOCK FLUSH 100 UNIT/ML IV SOLN
250.0000 [IU] | Freq: Every day | INTRAVENOUS | Status: DC
  Filled 2015-04-07: qty 3

## 2015-04-07 MED ORDER — CEFAZOLIN SODIUM-DEXTROSE 2-3 GM-% IV SOLR: 2.0000 g | Freq: Two times a day (BID) | INTRAVENOUS | Status: AC

## 2015-04-07 MED ORDER — CALCITRIOL 0.5 MCG PO CAPS: 0.5000 ug | ORAL_CAPSULE | Freq: Every day | ORAL | Status: DC

## 2015-04-07 MED ORDER — CALCIUM CARBONATE 1250 (500 CA) MG PO TABS: 1.0000 | ORAL_TABLET | Freq: Three times a day (TID) | ORAL | Status: DC

## 2015-04-07 MED ORDER — HEPARIN SOD (PORK) LOCK FLUSH 100 UNIT/ML IV SOLN
250.0000 [IU] | INTRAVENOUS | Status: DC | PRN
  Administered 2015-04-07: 250 [IU]
  Filled 2015-04-07: qty 3

## 2015-04-07 MED ORDER — VITAMIN D (ERGOCALCIFEROL) 1.25 MG (50000 UNIT) PO CAPS
50000.0000 [IU] | ORAL_CAPSULE | ORAL | Status: DC
  Administered 2015-04-07: 50000 [IU] via ORAL
  Filled 2015-04-07: qty 1

## 2015-04-07 MED ORDER — NEPRO/CARBSTEADY PO LIQD: 237.0000 mL | Freq: Two times a day (BID) | ORAL | Status: DC

## 2015-04-07 MED ORDER — POTASSIUM CHLORIDE CRYS ER 20 MEQ PO TBCR
40.0000 meq | EXTENDED_RELEASE_TABLET | Freq: Once | ORAL | Status: AC
  Administered 2015-04-07: 40 meq via ORAL
  Filled 2015-04-07: qty 2

## 2015-04-07 MED ORDER — VITAMIN D (ERGOCALCIFEROL) 1.25 MG (50000 UNIT) PO CAPS: 50000.0000 [IU] | ORAL_CAPSULE | ORAL | Status: DC

## 2015-04-07 MED ORDER — COLLAGENASE 250 UNIT/GM EX OINT: TOPICAL_OINTMENT | Freq: Every day | CUTANEOUS | Status: DC

## 2015-04-07 NOTE — Progress Notes (Signed)
Order to discharge patient. Patient set to leave, but discharge instructions cannot be printed out (reconciliation to be done before it can print). MD on call notified. Patient and family are okay with RN giving the instructions verbally and coming back later for the print out. Discharge instructions gone through with patient and husband. Patient's daughter to pick up instructions papers later.

## 2015-04-07 NOTE — Progress Notes (Signed)
Error

## 2015-04-07 NOTE — Progress Notes (Signed)
[place], MD  Rafe Mackowski H Cox, CMA            pls schedule a chest ct scan no contrast in 4-6 weeks. Dx : PNA. Will need to see her after chest ct scan. Tnx.

## 2015-04-07 NOTE — Progress Notes (Signed)
Name: [name] MRN: [id number] DOB: [birth year]    ADMISSION DATE:  03/29/2015  CHIEF COMPLAINT:  SOB.   BRIEF PATIENT DESCRIPTION:  Pt admitted for B PNA. Blood culture was (+) for MSSA. On cefazolin. Clinically improved. Less cough, SOB.  (-) fevers.  Almost at baseline. Received PICC yesterday.     PAST MEDICAL HISTORY :   has a past medical history of Peripheral vascular disease ([place]); Diabetes mellitus; COPD (chronic obstructive pulmonary disease) ([place]); Hyperlipidemia; Depression; Chronic diastolic CHF (congestive heart failure) ([place]); Essential hypertension; Renal artery stenosis ([facility]); CAD (coronary artery disease); Iron deficiency anemia; Diabetic retinopathy ([place]); GI AVM (gastrointestinal arteriovenous vascular malformation); Upper GI bleed from jejunum, AVM vs. Dieaulafoy's lesion (01/25/2012); Carotid artery disease ([place]); Hypertensive heart disease with congestive heart failure ([place]); CKD (chronic kidney disease), stage III; Tobacco abuse; Bradycardia; Anemia, chronic disease (05/16/2008); Dementia; and Protein-calorie malnutrition ([place]) (01/24/2015).  has past surgical history that includes Carotid endarterectomy; Cholecystectomy; Appendectomy; Tonsillectomy; Cesarean section; Shoulder arthroscopy; Iliac artery stent; enteroscopy (01/26/2012); Cardiac catheterization (N/A, 01/23/2015); and enteroscopy (N/A, 03/25/2015). Prior to Admission medications   Medication Sig Start Date End Date Taking? Authorizing Provider  albuterol (PROVENTIL HFA;VENTOLIN HFA) 108 (90 BASE) MCG/ACT inhaler Inhale 2 puffs into the lungs every 6 (six) hours as needed for wheezing or shortness of breath. 02/17/15  Yes Arnoldo Morale, MD  amLODipine (NORVASC) 10 MG tablet Take 0.5 tablets (5 mg total) by mouth daily. 03/26/15  Yes Lavina Hamman, MD  aspirin EC 81 MG tablet Take 1 tablet (81 mg total) by mouth daily. 02/17/15  Yes Arnoldo Morale, MD  atorvastatin (LIPITOR) 80 MG tablet Take 1 tablet (80 mg  total) by mouth daily at 6 PM. 02/17/15  Yes Arnoldo Morale, MD  benzonatate (TESSALON) 100 MG capsule Take 1 capsule (100 mg total) by mouth 3 (three) times daily as needed for cough. 03/26/15  Yes Lavina Hamman, MD  calcium-vitamin D 250-100 MG-UNIT tablet Take 1 tablet by mouth daily. 03/26/15  Yes Lavina Hamman, MD  carvedilol (COREG) 3.125 MG tablet Take 1 tablet (3.125 mg total) by mouth 2 (two) times daily with a meal. 03/12/15  Yes Ripudeep K Rai, MD  clopidogrel (PLAVIX) 75 MG tablet Take 1 tablet (75 mg total) by mouth daily. 02/17/15  Yes Arnoldo Morale, MD  famotidine (PEPCID) 20 MG tablet Take 1 tablet (20 mg total) by mouth daily. 03/12/15  Yes Ripudeep Krystal Eaton, MD  feeding supplement, ENSURE ENLIVE, (ENSURE ENLIVE) LIQD Take 237 mLs by mouth daily. 03/26/15  Yes Lavina Hamman, MD  Ferrous Sulfate (IRON) 325 (65 FE) MG TABS Take 325 mg by mouth 3 (three) times daily. 02/17/15  Yes Arnoldo Morale, MD  fish oil-omega-3 fatty acids 1000 MG capsule Take 1 g by mouth 3 (three) times daily. Three times daily   Yes Historical Provider, MD  furosemide (LASIX) 20 MG tablet Take 1 tablet (20 mg total) by mouth daily. 03/26/15  Yes Lavina Hamman, MD  guaiFENesin (MUCINEX) 600 MG 12 hr tablet Take 1 tablet (600 mg total) by mouth 2 (two) times daily. 03/26/15  Yes Lavina Hamman, MD  hydrocortisone 2.5 % lotion Apply topically 2 (two) times daily. 01/08/15  Yes Dorena Dew, FNP  insulin aspart (NOVOLOG) 100 UNIT/ML injection Before each meal 3 times a day, 140-199 - 2 units, 200-250 - 4 units, 251-299 - 6 units,  300-349 - 8 units,  350 or above 10 units. Dispense syringes and needles as  needed, Ok to switch to PEN if approved. Substitute to any brand approved. DX DM2, Code E11.65 03/09/15  Yes Thurnell Lose, MD  isosorbide mononitrate (IMDUR) 30 MG 24 hr tablet Take 1 tablet (30 mg total) by mouth daily. 02/17/15  Yes Arnoldo Morale, MD  mometasone-formoterol (DULERA) 100-5 MCG/ACT AERO Inhale 1 puff into the  lungs daily. 03/27/15  Yes Arnoldo Morale, MD  nitroGLYCERIN (NITROSTAT) 0.4 MG SL tablet Place 1 tablet (0.4 mg total) under the tongue every 5 (five) minutes as needed for chest pain. 01/28/15  Yes Delfina Redwood, MD  sertraline (ZOLOFT) 50 MG tablet Take 1 tablet (50 mg total) by mouth daily. 02/04/15  Yes Arnoldo Morale, MD  sodium bicarbonate 650 MG tablet Take 1 tablet (650 mg total) by mouth 2 (two) times daily. 03/26/15  Yes Lavina Hamman, MD  tamsulosin (FLOMAX) 0.4 MG CAPS capsule Take 1 capsule (0.4 mg total) by mouth daily. 03/12/15  Yes Ripudeep Krystal Eaton, MD   No Known Allergies  FAMILY HISTORY:  family history includes Angina in her father; Cancer in her brother and sister; Heart attack in her mother; Hypertension in her father and mother. There is no history of Stroke. SOCIAL HISTORY:  reports that she quit smoking about 7 weeks ago. Her smoking use included Cigarettes. She smoked 0.00 packs per day for 36 years. She has never used smokeless tobacco. She reports that she does not drink alcohol or use illicit drugs.  REVIEW OF SYSTEMS:   Constitutional: Negative for fever, chills, weight loss, malaise/fatigue and diaphoresis.  HENT: Negative for hearing loss, ear pain, nosebleeds, congestion, sore throat, neck pain, tinnitus and ear discharge.   Eyes: Negative for blurred vision, double vision, photophobia, pain, discharge and redness.  Respiratory: Negative for cough, hemoptysis, sputum production, shortness of breath, wheezing and stridor.   Cardiovascular: Negative for chest pain, palpitations, orthopnea, claudication, leg swelling and PND.  Gastrointestinal: Negative for heartburn, nausea, vomiting, abdominal pain, diarrhea, constipation, blood in stool and melena.  Genitourinary: Negative for dysuria, urgency, frequency, hematuria and flank pain.  Musculoskeletal: Negative for myalgias, back pain, joint pain and falls.  Skin: Negative for itching and rash.  Neurological: Negative  for dizziness, tingling, tremors, sensory change, speech change, focal weakness, seizures, loss of consciousness, weakness and headaches.  Endo/Heme/Allergies: Negative for environmental allergies and polydipsia. Does not bruise/bleed easily.  SUBJECTIVE:   VITAL SIGNS: Temp:  [98.1 F (36.7 C)-98.4 F (36.9 C)] 98.1 F (36.7 C) (02/14 0739) Pulse Rate:  [71-81] 81 (02/14 0739) Resp:  [19-20] 19 (02/14 0739) BP: (125-141)/(48-63) 126/58 mmHg (02/14 0739) SpO2:  [96 %-99 %] 97 % (02/14 0937) Weight:  [139 lb 5.3 oz (63.2 kg)] 139 lb 5.3 oz (63.2 kg) (02/13 2053)  PHYSICAL EXAMINATION:  Vitals:  Filed Vitals:   04/06/15 2053 04/07/15 0421 04/07/15 0739 04/07/15 0937  BP: 138/50 125/63 126/58   Pulse: 75 76 81   Temp: 98.2 F (36.8 C) 98.4 F (36.9 C) 98.1 F (36.7 C)   TempSrc: Oral Oral Oral   Resp: 19 20 19    Height:      Weight: 139 lb 5.3 oz (63.2 kg)     SpO2: 99% 98% 96% 97%    Constitutional/General:  Pleasant, well-nourished, well-developed, not in any distress,  Comfortably seating.  Well kempt  Body mass index is 29.13 kg/(m^2). Wt Readings from Last 3 Encounters:  04/06/15 139 lb 5.3 oz (63.2 kg)  03/26/15 127 lb 13.9 oz (58 kg)  03/12/15  132 lb (59.875 kg)     HEENT: Pupils equal and reactive to light and accommodation. Anicteric sclerae. Normal nasal mucosa.   No oral  lesions,  mouth clear,  oropharynx clear, no postnasal drip. (-) Oral thrush. No dental caries.  Airway - Mallampati class III  Neck: No masses. Midline trachea. No JVD, (-) LAD. (-) bruits appreciated.  Respiratory/Chest: Grossly normal chest. (-) deformity. (-) Accessory muscle use.  Symmetric expansion. (-) Tenderness on palpation.  Resonant on percussion.  Diminished BS on both lower lung zones. (-) wheezing,  rhonchi (-) egophony Bibasilar crackles  Cardiovascular: Regular rate and  rhythm, heart sounds normal, no murmur or gallops, no peripheral edema  Gastrointestinal:   Normal bowel sounds. Soft, non-tender. No hepatosplenomegaly.  (-) masses.   Musculoskeletal:  Normal muscle tone. Normal gait.   Extremities: Grossly normal. (-) clubbing, cyanosis.  (-) edema  Skin: (-) rash,lesions seen.   Neurological/Psychiatric : alert, oriented to time, place, person. Normal mood and affect   Recent Labs Lab 04/05/15 0612 04/06/15 1119 04/07/15 0448  NA 143 142 141  K 3.8 3.5 3.2*  CL 109 104 107  CO2 16* 22 22  BUN 33* 31* 33*  CREATININE 2.51* 2.61* 2.49*  GLUCOSE 77 159* 96    Recent Labs Lab 04/02/15 0746 04/04/15 1410 04/05/15 0612  HGB 9.6* 9.0* 9.4*  HCT 29.2* 27.4* 28.8*  WBC 11.9* 10.2 12.9*  PLT 341 337 341   Ir Fluoro Guide Cv Line Right  04/06/2015  INDICATION: 65 year old with chronic kidney disease and bacteremia. Request for tunneled central venous catheter for antibiotics. EXAM: FLUOROSCOPIC AND ULTRASOUND GUIDED PLACEMENT OF A TUNNELED CENTRAL VENOUS CATHETER Physician: Stephan Minister. Anselm Pancoast, MD FLUOROSCOPY TIME:  12 seconds, 0.2 mGy MEDICATIONS: Patient is already on antibiotics. ANESTHESIA/SEDATION: None PROCEDURE: Informed consent was obtained for placement of a tunneled central venous catheter. The patient was placed supine on the interventional table. Ultrasound confirmed a patent right internal jugularvein. Ultrasound images were obtained for documentation. The right side of the neck was prepped and draped in a sterile fashion. The right side of the neck was anesthetized with 1% lidocaine. Maximal barrier sterile technique was utilized including caps, mask, sterile gowns, sterile gloves, sterile drape, hand hygiene and skin antiseptic. A small incision was made with #11 blade scalpel. A 21 gauge needle directed into the right internal jugular vein with ultrasound guidance. A micropuncture dilator set was placed. A single lumen Powerline catheter was selected. The skin below the right clavicle was anesthetized and a small incision was made  with an #11 blade scalpel. A subcutaneous tunnel was formed to the vein dermatotomy site. The catheter was brought through the tunnel. The vein dermatotomy site was dilated to accommodate a peel-away sheath. The catheter was placed through the peel-away sheath and directed into the central venous structures. The tip of the catheter was placed at the superior cavoatrial junction with fluoroscopy. Fluoroscopic images were obtained for documentation. Both lumens were found to aspirate and flush well. The vein dermatotomy site was closed using a single layer of absorbable suture and Dermabond. The catheter was secured to the skin using Prolene suture. FINDINGS: Catheter tip at the superior cavoatrial junction. COMPLICATIONS: None IMPRESSION: Successful placement of a right jugular tunneled central venous catheter using ultrasound and fluoroscopic guidance. Electronically Signed   By: Markus Daft M.D.   On: 04/06/2015 17:08   Ir US Guide Vasc Access Right  04/06/2015  INDICATION: 65 year old with chronic kidney disease and bacteremia. Request  for tunneled central venous catheter for antibiotics. EXAM: FLUOROSCOPIC AND ULTRASOUND GUIDED PLACEMENT OF A TUNNELED CENTRAL VENOUS CATHETER Physician: Stephan Minister. Anselm Pancoast, MD FLUOROSCOPY TIME:  12 seconds, 0.2 mGy MEDICATIONS: Patient is already on antibiotics. ANESTHESIA/SEDATION: None PROCEDURE: Informed consent was obtained for placement of a tunneled central venous catheter. The patient was placed supine on the interventional table. Ultrasound confirmed a patent right internal jugularvein. Ultrasound images were obtained for documentation. The right side of the neck was prepped and draped in a sterile fashion. The right side of the neck was anesthetized with 1% lidocaine. Maximal barrier sterile technique was utilized including caps, mask, sterile gowns, sterile gloves, sterile drape, hand hygiene and skin antiseptic. A small incision was made with #11 blade scalpel. A 21 gauge  needle directed into the right internal jugular vein with ultrasound guidance. A micropuncture dilator set was placed. A single lumen Powerline catheter was selected. The skin below the right clavicle was anesthetized and a small incision was made with an #11 blade scalpel. A subcutaneous tunnel was formed to the vein dermatotomy site. The catheter was brought through the tunnel. The vein dermatotomy site was dilated to accommodate a peel-away sheath. The catheter was placed through the peel-away sheath and directed into the central venous structures. The tip of the catheter was placed at the superior cavoatrial junction with fluoroscopy. Fluoroscopic images were obtained for documentation. Both lumens were found to aspirate and flush well. The vein dermatotomy site was closed using a single layer of absorbable suture and Dermabond. The catheter was secured to the skin using Prolene suture. FINDINGS: Catheter tip at the superior cavoatrial junction. COMPLICATIONS: None IMPRESSION: Successful placement of a right jugular tunneled central venous catheter using ultrasound and fluoroscopic guidance. Electronically Signed   By: Markus Daft M.D.   On: 04/06/2015 17:08    ASSESSMENT / PLAN:  6F, former smoker, repeated admissions. Admitted for cough SOB and sputum production. Blood culture on admission with MSSA. On cefazolin D5. Chest CT scan with B nodular infiltrates. Rpt blood culture is (-). Over all fells better.  Plan: 1. Pt is on Plavix and has inc risk of bleeding. Will hold off on bronch and bx unless pt clinically worsens or chest ct scan does not get better. Pt at high risk for cx as well. I d/w pt and husband re: the plan to hold off on bronch unless necessary. Pt agree with the plan.  2. Cont cefazolin. ID following. 3. If for d/c -- pt will need a rpt chest ct scan in 4-6  weeks. I assume she will be on abx at least a month. I will see her after chest ct scan and reassess the need for bronch.   4. Cont dulera. 5. DVT prophylaxis. 6. We will try to schedule the chest ct scan as oupt.    Elsie Saas A. Corrie Dandy, MD [facility] [place] Pager : [phone] After 3 pm or if no response, call [phone]  04/07/2015, 12:19 PM

## 2015-04-07 NOTE — Discharge Instructions (Signed)
Bacteremia °Bacteremia is the presence of bacteria in the blood. A small amount of bacteria may not cause any symptoms. °Sometimes, the bacteria spread and cause infection in other parts of the body, such as the heart, joints, bones, or brain. Having a great amount of bacteria can cause a serious, sometimes life-threatening infection called sepsis. °CAUSES °This condition is caused by bacteria that get into the blood. Bacteria can enter the blood: °· During a dental or medical procedure. °· After you brush your teeth so hard that the gums bleed. °· Through a scrape or cut on your skin. °More severe types of bacteremia can be caused by: °· A bacterial infection, such as pneumonia, that spreads to the blood. °· Using a dirty needle. °RISK FACTORS °This condition is more likely to develop in: °· Children and elderly adults. °· People who have a long-lasting (chronic) disease or medical condition. °· People who have an artificial joint or heart valve. °· People who have heart valve disease. °· People who have a tube, such as a catheter or IV tube, that has been inserted for a medical treatment. °· People who have a weak body defense system (immune system). °· People who use IV drugs. °SYMPTOMS °Usually, this condition does not cause symptoms when it is mild. When it is more serious, it may cause: °· Fever. °· Chills. °· Racing heart. °· Shortness of breath. °· Dizziness. °· Weakness. °· Confusion. °· Nausea or vomiting. °· Diarrhea. °Bacteremia that has spread to other parts of the body may cause symptoms in those areas. °DIAGNOSIS °This condition may be diagnosed with a physical exam and tests, such as: °· A complete blood count (CBC). This test looks for signs of infection. °· Blood cultures. These look for bacteria in your blood. °· Tests of any IV tubes. These look for a source of infection. °· Urine tests. °· Imaging tests, such as an X-ray, CT scan, MRI, or heart ultrasound. °TREATMENT °If the condition is mild,  treatment is usually not needed. Usually, the body's immune system will remove the bacteria. If the condition is more serious, it may be treated with: °· Antibiotic medicines through an IV tube. These may be given for about 2 weeks. At first, the antibiotic that is given may kill most types of blood bacteria. If your test results show that a certain kind of bacteria is causing problems, the antibiotic may be changed to kill only the bacteria that are causing problems. °· Antibiotics taken by mouth. °· Removing any catheter or IV tube that is a source of infection. °· Blood pressure and breathing support, if needed. °· Surgery to control the source or spread of infection, if needed. °HOME CARE INSTRUCTIONS °· Take over-the-counter and prescription medicines only as told by your health care provider. °· If you were prescribed an antibiotic, take it as told by your health care provider. Do not stop taking the antibiotic even if you start to feel better. °· Rest at home until your condition is under control. °· Drink enough fluid to keep your urine clear or pale yellow. °· Keep all follow-up visits as told by your health care provider. This is important. °PREVENTION °Take these actions to help prevent future episodes of bacteremia: °· Get all vaccinations as recommended by your health care provider. °· Clean and cover scrapes or cuts. °· Bathe regularly. °· Wash your hands often. °· Before any dental or surgical procedure, ask your health care provider if you should take an antibiotic. °SEEK MEDICAL   CARE IF:  Your symptoms get worse.  You continue to have symptoms after treatment.  You develop new symptoms after treatment. SEEK IMMEDIATE MEDICAL CARE IF:  You have chest pain or trouble breathing.  You develop confusion, dizziness, or weakness.  You develop pale skin.   This information is not intended to replace advice given to you by your health care provider. Make sure you discuss any questions you have  with your health care provider.   Document Released: 11/21/2005 Document Revised: 10/29/2014 Document Reviewed: 04/12/2014 Elsevier Interactive Patient Education 2016 [place] Pneumonia, Adult Pneumonia is an infection of the lungs. There are different types of pneumonia. One type can develop while a person is in a hospital. A different type, called community-acquired pneumonia, develops in people who are not, or have not recently been, in the hospital or other health care facility.  CAUSES Pneumonia may be caused by bacteria, viruses, or funguses. Community-acquired pneumonia is often caused by Streptococcus pneumonia bacteria. These bacteria are often passed from one person to another by breathing in droplets from the cough or sneeze of an infected person. RISK FACTORS The condition is more likely to develop in:  People who havechronic diseases, such as chronic obstructive pulmonary disease (COPD), asthma, congestive heart failure, cystic fibrosis, diabetes, or kidney disease.  People who haveearly-stage or late-stage HIV.  People who havesickle cell disease.  People who havehad their spleen removed (splenectomy).  People who havepoor Human resources officer.  People who havemedical conditions that increase the risk of breathing in (aspirating) secretions their own mouth and nose.   People who havea weakened immune system (immunocompromised).  People who smoke.  People whotravel to areas where pneumonia-causing germs commonly exist.  People whoare around animal habitats or animals that have pneumonia-causing germs, including birds, bats, rabbits, cats, and farm animals. SYMPTOMS Symptoms of this condition include:  Adry cough.  A wet (productive) cough.  Fever.  Sweating.  Chest pain, especially when breathing deeply or coughing.  Rapid breathing or difficulty breathing.  Shortness of breath.  Shaking chills.  Fatigue.  Muscle  aches. DIAGNOSIS Your health care provider will take a medical history and perform a physical exam. You may also have other tests, including:  Imaging studies of your chest, including X-rays.  Tests to check your blood oxygen level and other blood gases.  Other tests on blood, mucus (sputum), fluid around your lungs (pleural fluid), and urine. If your pneumonia is severe, other tests may be done to identify the specific cause of your illness. TREATMENT The type of treatment that you receive depends on many factors, such as the cause of your pneumonia, the medicines you take, and other medical conditions that you have. For most adults, treatment and recovery from pneumonia may occur at home. In some cases, treatment must happen in a hospital. Treatment may include:  Antibiotic medicines, if the pneumonia was caused by bacteria.  Antiviral medicines, if the pneumonia was caused by a virus.  Medicines that are given by mouth or through an IV tube.  Oxygen.  Respiratory therapy. Although rare, treating severe pneumonia may include:  Mechanical ventilation. This is done if you are not breathing well on your own and you cannot maintain a safe blood oxygen level.  Thoracentesis. This procedureremoves fluid around one lung or both lungs to help you breathe better. HOME CARE INSTRUCTIONS  Take over-the-counter and prescription medicines only as told by your health care provider.  Only takecough medicine if you are losing sleep.  Understand that cough medicine can prevent your body's natural ability to remove mucus from your lungs.  If you were prescribed an antibiotic medicine, take it as told by your health care provider. Do not stop taking the antibiotic even if you start to feel better.  Sleep in a semi-upright position at night. Try sleeping in a reclining chair, or place a few pillows under your head.  Do not use tobacco products, including cigarettes, chewing tobacco, and  e-cigarettes. If you need help quitting, ask your health care provider.  Drink enough water to keep your urine clear or pale yellow. This will help to thin out mucus secretions in your lungs. PREVENTION There are ways that you can decrease your risk of developing community-acquired pneumonia. Consider getting a pneumococcal vaccine if:  You are older than 65 years of age.  You are older than 65 years of age and are undergoing cancer treatment, have chronic lung disease, or have other medical conditions that affect your immune system. Ask your health care provider if this applies to you. There are different types and schedules of pneumococcal vaccines. Ask your health care provider which vaccination option is best for you. You may also prevent community-acquired pneumonia if you take these actions:  Get an influenza vaccine every year. Ask your health care provider which type of influenza vaccine is best for you.  Go to the dentist on a regular basis.  Wash your hands often. Use hand sanitizer if soap and water are not available. SEEK MEDICAL CARE IF:  You have a fever.  You are losing sleep because you cannot control your cough with cough medicine. SEEK IMMEDIATE MEDICAL CARE IF:  You have worsening shortness of breath.  You have increased chest pain.  Your sickness becomes worse, especially if you are an older adult or have a weakened immune system.  You cough up blood.   This information is not intended to replace advice given to you by your health care provider. Make sure you discuss any questions you have with your health care provider.   Document Released: 02/07/2005 Document Revised: 10/29/2014 Document Reviewed: 06/04/2014 Elsevier Interactive Patient Education [employer].

## 2015-04-07 NOTE — Progress Notes (Signed)
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 Does not appear volume overloaded. Electrolytes stable. Metabolic acidosis appears to have resolved with sodium bicarb PO.   Metabolic acidosis: Resolved. Currently on sodium bicarbonate 650mg  BID.  - continue sodium bicarb BID.   Metabolic bone disease: Patient's PTH on 1/12 was 9, on repeat on 1/27 it was 97. Her PTHrp was <1.1. Phosphorus was at goal at 3.2   Hypocalcemia: initially treated for hypercalcemia with Zometa and calcitonin, now hypocalcemic. Most likely from vitamin D deficiency. Possibly some component of ingestion (a whole bottle of Tums) when she was initially hypercalcemic. Vitamin D level on 1/13 < 5 and repeat vitamin D level on 1/27 after supplementation was 7.2. Received vitamin D 50,000 units 1 and started 1000 units daily. Corrected Ca today was 9.2. Patient received Zometa on 1/12. Typically this lasts for approximately 3 weeks, however with the patient's poor renal function it may be lasting longer than expected given it is approximately 40% renally excreted. She is currently on Oscal 500mg  TID.  - continue Oscal 500mg  TID - continue calcitriol 0.45mcg   - if this is in fact secondary to delayed excretion of Zometa and calcium rebounds to be hypercalcemic again, would consider malignancy work up.   Chronic diastolic heart failure- Evolemic on exam. Last echo 04/03/15 with EF 55-60% and grade 2 diastolic dysfunction.   Anemia of chronic kidney disease and iron deficiency: Hgb stable around 9. On ferrous sulfate 325mg  TID currently. Last %sat was 5 with a ferritin of 360 and an iron of 9. Could consider IV iron for better absorption once not acutely infected. She was also started on Aranesp on 2/12 as well.   MSSA bacteremia: per primary team and ID. On cefazolin currently. Has tunneled IJ placed on the R.  Lewy-body dementia:  stable per husband. Currently oriented to person, place, time, and situation.   Renal will sign off. Please contact us with any further questions.   Subjective:   Doing well. No complaints. No SOB. Had Right tunneled IJ placed yesterday.    Objective:   BP 125/63 mmHg  Pulse 76  Temp(Src) 98.4 F (36.9 C) (Oral)  Resp 20  Ht 4\' 10"  (1.473 m)  Wt 139 lb 5.3 oz (63.2 kg)  BMI 29.13 kg/m2  SpO2 98%  Intake/Output Summary (Last 24 hours) at 04/07/15 0721 Last data filed at 04/07/15 0655  Gross per 24 hour  Intake    720 ml  Output    326 ml  Net    394 ml   Weight change:   Physical Exam: General: sitting in the bedside chair in NAD watching TV.  Eyes: Conjunctivae non-injected.  ENTM: Moist mucous membranes. Oropharynx clear. No nasal discharge.  Neck: Supple. Right tunneled IJ placed.  Cardiovascular: RRR. No murmurs, rubs, or gallops noted. No pitting edema noted. Respiratory: No increased WOB. Intermittent expiratory wheeze.   Imaging: Ir Fluoro Guide Cv Line Right  04/06/2015  INDICATION: 65 year old with chronic kidney disease and bacteremia. Request for tunneled central venous catheter for antibiotics. EXAM: FLUOROSCOPIC AND ULTRASOUND GUIDED PLACEMENT OF A TUNNELED CENTRAL VENOUS CATHETER Physician: Stephan Minister. Anselm Pancoast, MD FLUOROSCOPY TIME:  12 seconds, 0.2 mGy MEDICATIONS: Patient is already on antibiotics. ANESTHESIA/SEDATION: None PROCEDURE: Informed consent was obtained for placement of a tunneled central venous catheter. The patient was placed supine on the interventional table. Ultrasound confirmed a patent right internal jugularvein.  Ultrasound images were obtained for documentation. The right side of the neck was prepped and draped in a sterile fashion. The right side of the neck was anesthetized with 1% lidocaine. Maximal barrier sterile technique was utilized including caps, mask, sterile gowns, sterile gloves, sterile drape, hand hygiene and skin antiseptic. A small  incision was made with #11 blade scalpel. A 21 gauge needle directed into the right internal jugular vein with ultrasound guidance. A micropuncture dilator set was placed. A single lumen Powerline catheter was selected. The skin below the right clavicle was anesthetized and a small incision was made with an #11 blade scalpel. A subcutaneous tunnel was formed to the vein dermatotomy site. The catheter was brought through the tunnel. The vein dermatotomy site was dilated to accommodate a peel-away sheath. The catheter was placed through the peel-away sheath and directed into the central venous structures. The tip of the catheter was placed at the superior cavoatrial junction with fluoroscopy. Fluoroscopic images were obtained for documentation. Both lumens were found to aspirate and flush well. The vein dermatotomy site was closed using a single layer of absorbable suture and Dermabond. The catheter was secured to the skin using Prolene suture. FINDINGS: Catheter tip at the superior cavoatrial junction. COMPLICATIONS: None IMPRESSION: Successful placement of a right jugular tunneled central venous catheter using ultrasound and fluoroscopic guidance. Electronically Signed   By: Markus Daft M.D.   On: 04/06/2015 17:08   Ir US Guide Vasc Access Right  04/06/2015  INDICATION: 65 year old with chronic kidney disease and bacteremia. Request for tunneled central venous catheter for antibiotics. EXAM: FLUOROSCOPIC AND ULTRASOUND GUIDED PLACEMENT OF A TUNNELED CENTRAL VENOUS CATHETER Physician: Stephan Minister. Anselm Pancoast, MD FLUOROSCOPY TIME:  12 seconds, 0.2 mGy MEDICATIONS: Patient is already on antibiotics. ANESTHESIA/SEDATION: None PROCEDURE: Informed consent was obtained for placement of a tunneled central venous catheter. The patient was placed supine on the interventional table. Ultrasound confirmed a patent right internal jugularvein. Ultrasound images were obtained for documentation. The right side of the neck was prepped and  draped in a sterile fashion. The right side of the neck was anesthetized with 1% lidocaine. Maximal barrier sterile technique was utilized including caps, mask, sterile gowns, sterile gloves, sterile drape, hand hygiene and skin antiseptic. A small incision was made with #11 blade scalpel. A 21 gauge needle directed into the right internal jugular vein with ultrasound guidance. A micropuncture dilator set was placed. A single lumen Powerline catheter was selected. The skin below the right clavicle was anesthetized and a small incision was made with an #11 blade scalpel. A subcutaneous tunnel was formed to the vein dermatotomy site. The catheter was brought through the tunnel. The vein dermatotomy site was dilated to accommodate a peel-away sheath. The catheter was placed through the peel-away sheath and directed into the central venous structures. The tip of the catheter was placed at the superior cavoatrial junction with fluoroscopy. Fluoroscopic images were obtained for documentation. Both lumens were found to aspirate and flush well. The vein dermatotomy site was closed using a single layer of absorbable suture and Dermabond. The catheter was secured to the skin using Prolene suture. FINDINGS: Catheter tip at the superior cavoatrial junction. COMPLICATIONS: None IMPRESSION: Successful placement of a right jugular tunneled central venous catheter using ultrasound and fluoroscopic guidance. Electronically Signed   By: Markus Daft M.D.   On: 04/06/2015 17:08    Labs: [employer]  Recent [employer] 04/01/15 [id number] 04/02/15 [id number] 04/03/15 [id number] 04/04/15 0440 04/05/15 0612 04/06/15 1119 04/07/15 0448  NA 142 142 147* 141 143 142 141  K 3.4* 3.7 4.1 2.8* 3.8 3.5 3.2*  CL 108 106 110 108 109 104 107  CO2 22 21* 21* 21* 16* 22 22  GLUCOSE 97 91 60* 87 77 159* 96  BUN 46* 45* 42* 36* 33* 31* 33*  CREATININE 3.10* 2.98* 2.90* 2.68* 2.51* 2.61* 2.49*  CALCIUM 6.0* 6.1* 6.4* 6.2* 6.6* 7.6* 7.6*  PHOS  --  2.5 3.5  --    --  3.2 3.2   CBC  Recent Labs Lab 04/02/15 0746 04/04/15 1410 04/05/15 0612  WBC 11.9* 10.2 12.9*  NEUTROABS  --  9.0* 10.7*  HGB 9.6* 9.0* 9.4*  HCT 29.2* 27.4* 28.8*  MCV 88.8 89.0 87.0  PLT 341 337 341    Medications:    . amLODipine  5 mg Oral Daily  . atorvastatin  80 mg Oral q1800  . calcitRIOL  0.5 mcg Oral Daily  . calcium carbonate  1 tablet Oral TID WC  . carvedilol  3.125 mg Oral BID WC  .  ceFAZolin (ANCEF) IV  2 g Intravenous Q12H  . cholecalciferol  1,000 Units Oral Daily  . clopidogrel  75 mg Oral Daily  . collagenase   Topical Daily  . darbepoetin (ARANESP) injection - NON-DIALYSIS  40 mcg Subcutaneous Q Sun-1800  . famotidine  20 mg Oral Daily  . feeding supplement (NEPRO CARB STEADY)  237 mL Oral BID BM  . ferrous sulfate  325 mg Oral TID WC  . furosemide  20 mg Oral Daily  . guaiFENesin  600 mg Oral BID  . isosorbide mononitrate  30 mg Oral Daily  . mometasone-formoterol  1 puff Inhalation Daily  . omega-3 acid ethyl esters  1 g Oral TID  . sertraline  50 mg Oral Daily  . sodium bicarbonate  650 mg Oral BID  . sodium chloride flush  3 mL Intravenous Q12H  . tamsulosin  0.4 mg Oral Daily      Kathrine Cords, MD [facility] - Inpatient Family Medicine Resident, PGY-2 04/07/2015, 7:21 AM   Renal Attending: Hypocalcemia n setting of 1,25 and 25 D deficiency.  Will replete both and see if she can function just on 25-D.  ACP

## 2015-04-07 NOTE — Discharge Summary (Signed)
Physician Discharge Summary  [name]  [id number]  DOB: 05/19/1950  DOA: 03/29/2015  PCP: [name] Morale, MD  Primary Nephrologist: Dr. Erling [name]  Admit date: 03/29/2015 Discharge date: 04/07/2015  Time spent: Greater than 30 minutes  Recommendations for Outpatient Follow-up:  1. Dr. Arnoldo [name], PCP on 04/09/15 at 11:30 AM. 2. Dr. Carlyle [name], Infectious Disease in 3 weeks. 3. Dr. Erling [name], Nephrology in 2 weeks. 4. Dr. Rush [name], Pulmonology in 4 weeks. 5. Home health RN for IV antibiotics. IV cefazolin per pharmacy through 04/25/2015. [name] needs to be referred to Interventional Radiology after completion of antibiotics for removal of tunneled IJ catheter. [name] to have weekly labs (CBC once a week and renal panel 2 times per week-next draw 04/09/15) and labs are to be faxed to Dr. Carlyle [name] at [phone].  Discharge Diagnoses:  Principal Problem:   HCAP (healthcare-associated pneumonia) Active Problems:   Hyperlipidemia   Anemia, chronic disease   Chronic obstructive pulmonary disease (HCC)   Renal artery stenosis (HCC)   Peripheral vascular disease (HCC)   Upper GI bleed from jejunum, AVM vs. Dieaulafoy's lesion   Diet-controlled diabetes mellitus (HCC)   Iron deficiency anemia   On home oxygen therapy   CKD (chronic kidney disease), stage IV (HCC)   CAD (coronary artery disease)   Chronic diastolic CHF (congestive heart failure) ([name])   Essential hypertension   Former tobacco use   Protein-calorie malnutrition ([place])   Lewy body dementia without behavioral disturbance   Hypocalcemia   Sacral ulcer   Acute respiratory failure (HCC)   Hypomagnesemia   Low serum vitamin D   Metabolic acidosis   Malnutrition of moderate degree   Palliative care encounter   Discharge Condition: Improved & Stable  Diet recommendation: Heart healthy and diabetic diet.  Filed Weights   04/02/15 2041 04/03/15 2045 04/06/15 2053  Weight: 63.2 kg  (139 lb 5.3 oz) 63.4 kg (139 lb 12.4 oz) 63.2 kg (139 lb 5.3 oz)    History of present illness:  65 year old female [name] with history of stage IV chronic kidney disease, COPD not PRN home oxygen, chronic diastolic CHF, HTN, recent GI bleed, CAD, HLD, type II DM, was admitted to [facility] on 03/29/15 with complaints of cough, dyspnea and subjective fevers. She was recently discharged from hospital on 03/26/15 after treatment for upper GI bleed. She underwent EGD during that admission which did not show evidence of active bleeding but there was a suspected distal intestinal ulcer or AVM. At that time she was also seen by cardiology secondary to chest discomfort and elevated troponins. She had a Myoview on 01/22/15 with intermediate risk, EF 25%. She was not felt to be a candidate for open heart surgery of Cardiac catheterization or PCI given her bleeding risk and renal failure. Hence cardiology recommended medical management and continued clopidogrel monotherapy. During this admission, she was diagnosed with healthcare associated pneumonia with MSSA bacteremia. Infectious disease was consulted and [name] will be discharged on prolonged IV cefazolin. She is to have repeat CT chest and follow-up with pulmonology to ensure resolution of findings otherwise may need further workup. Nephrology consulted for chronic kidney disease and electrolyte abnormalities i.e. hypocalcemia. Palliative care team was consulted for goals of care.  Hospital Course:   1. Healthcare associated pneumonia with MSSA bacteremia: Treated initially empirically with IV cefepime and vancomycin. CT chest without contrast findings as below. Infectious disease was consulted. Her CT was felt to be unusual or concerning for septic  emboli or could this be malignancy versus fungal infection. Pulmonology was consulted. She was felt to be high risk for bleeding and other complications for bronchoscopy while on Plavix. 2-D echo did not show any obvious  vegetations.Final recommendations were to complete a prolonged course of IV cefazolin (end date 04/25/15) and repeat CT chest in 4 weeks and outpatient follow-up with pulmonology and infectious disease. As discussed with nephrology, no PICC line due to chronic kidney disease. IR was consulted and placed right tunneled IJ catheter. Home health RN consulted for IV antibiotics and home lab draws. Tunneled IJ catheter will have to be removed. Clinically improved and asymptomatic. Surveillance blood cultures 2 from 03/31/15: Negative. 2. Acute on chronic respiratory failure with hypoxia: Secondary to pneumonia. Hypoxia resolved. 3. 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 Acute renal failure has resolved. Creatinine has approached baseline. Due to complex electrolyte abnormalities, nephrology was consulted. She does not appear volume overloaded. Electrolytes are stable. Metabolic acidosis has resolved on oral sodium bicarbonate. Nephrology has cleared her for discharge. 4. Metabolic bone disease: [name]'s PTH on 1/12 was 9, repeat on 1/27 was 97. Her PTH RP was <1.1. Phosphorus was at goal at 3.2. 5. Hypocalcemia: During prior hospitalization, treated for hypercalcemia with Zometa and calcitonin and now presented with hypocalcemia. Most likely from vitamin D deficiency. Possibly some component of ingestion (a whole bottle of Tums) when she was initially hypercalcemic. Vitamin D level on 1/13 < 5 and repeat vitamin D level on 1/27 after supplementation was 7.2. Received vitamin D 50K units 1 and started 1000 units daily. Corrected calcium today 9.2. [name] received Zometa on 1/12. Typically this lasts for approximately 3 weeks, however with the [name]'s poor renal function it may be lasting longer than expected given it is approximately 40% renally excreted. Nephrology was consulted and recommended continuing Os-Cal 500 MG 3 times a day, added calcitriol 0.5 g daily and  vitamin D 50,000 units weekly. As per nephrology, if calcium rebounds and [name] becomes hypercalcemic again, would consider malignancy workup. No clinical manifestations of hypocalcemia 6. Hypomagnesemia: Replaced 7. Recent upper GI bleed: Resolved. Hemoglobin stable. 8. Iron deficiency/anemia of chronic kidney disease: Continue oral iron supplements. Ferritin 360, RBC folate 1457, iron saturation 5%, B12: 1290. She also received Aranesp in the hospital. 9. COPD/chronic respiratory failure with hypoxia: Stable. As per [name], he utilizes oxygen only at night or as needed. 10. Chronic diastolic CHF: Compensated. 11. Essential hypertension: Controlled. Continue amlodipine, carvedilol and Imdur. ARB discontinued during prior hospitalization due to chronic kidney disease. 12. Type II DM: Continue home SSI. 13. CAD: Asymptomatic. 14. Protein calorie malnutrition: Continue nutritional supplements. 15. Lewy body dementia with behavioral disturbance: Mental status at baseline. 16. Sacral ulcer: Management per wound RN. 26. Former tobacco abuse: Quit 3 months prior to admission. 18. Hyperlipidemia: Continue Lipitor and omega-3 fatty acids. 19. Hypokalemia: Replaced prior to discharge. Outpatient follow-up with repeat labs. 20. Adult failure to thrive: [name] has had 6 admissions in the last 6 months. Multifactorial due to several severe comorbidities and age. Palliative care was consulted. [name] was transitioned to DO NOT RESUSCITATE but otherwise [name] and family wish to undergo full treatment and will hopefully of some sort of improvement.  Consultants:  Infectious disease  Pulmonology  Palliative care team  Interventional radiology  Procedures:  Right tunneled IJ catheter placed by IR on 1/13  2-D echo 04/03/15: Study Conclusions  - Left ventricle: The cavity size was normal. Wall thickness  was normal. Systolic function was normal. The estimated ejection fraction was in the  range of 55% to 60%. Wall motion was normal; there were no regional wall motion abnormalities. Features are consistent with a pseudonormal left ventricular filling pattern, with concomitant abnormal relaxation and increased filling pressure (grade 2 diastolic dysfunction). - Mitral valve: Calcified annulus. Mildly thickened leaflets . Mild thickening of the anterior leaflet and posterior leaflet, involving the leaflet margin more than the base, possibly a sign of previous rheumatic disease. Mobility of the posterior leaflet was mildly restricted. Diastolic leaflet doming was present. There was moderate regurgitation directed centrally. - Left atrium: The atrium was mildly dilated. - Pulmonary arteries: Systolic pressure was moderately increased. PA peak pressure: 62 mm Hg (S). - Pericardium, extracardiac: A trivial pericardial effusion was identified.  Impressions:  - No vegetations are seen. Consider TEE if endocarditis is clinically sudpected. Increased velocities recorded during constinuous Doppler evaluation of the descending thoracic aorta suggest the presence of left subclavian or left carotid stenosis.  Antimicrobials:  IV cefepime 2/5 > 2/8  IV vancomycin 2/5 > 2/8  IV cefazolin 2/8 > 04/25/15   Discharge Exam:  Complaints:  [name] denies complaints and is anxious to go home.  Filed Vitals:   04/06/15 2053 04/07/15 0421 04/07/15 0739 04/07/15 0937  BP: 138/50 125/63 126/58   Pulse: 75 76 81   Temp: 98.2 F (36.8 C) 98.4 F (36.9 C) 98.1 F (36.7 C)   TempSrc: Oral Oral Oral   Resp: 19 20 19    Height:      Weight: 63.2 kg (139 lb 5.3 oz)     SpO2: 99% 98% 96% 97%    General exam: Small built and frail pleasant middle-aged female sitting up in bed this morning. Appears chronically ill-looking. Respiratory system: Clear. No increased work of breathing. Cardiovascular system: S1 & S2 heard, RRR. No JVD, murmurs, gallops, clicks or  pedal edema.  Gastrointestinal system: Abdomen is nondistended, soft and nontender. Normal bowel sounds heard. Central nervous system: Alert and oriented. No focal neurological deficits. Extremities: Symmetric 5 x 5 power.  Discharge Instructions      Discharge Instructions    (HEART FAILURE PATIENTS) Call MD:  Anytime you have any of the following symptoms: 1) 3 pound weight gain in 24 hours or 5 pounds in 1 week 2) shortness of breath, with or without a dry hacking cough 3) swelling in the hands, feet or stomach 4) if you have to sleep on extra pillows at night in order to breathe.    Complete by:  As directed      Call MD for:  difficulty breathing, headache or visual disturbances    Complete by:  As directed      Call MD for:  extreme fatigue    Complete by:  As directed      Call MD for:  persistant dizziness or light-headedness    Complete by:  As directed      Call MD for:  persistant nausea and vomiting    Complete by:  As directed      Call MD for:  redness, tenderness, or signs of infection (pain, swelling, redness, odor or green/yellow discharge around incision site)    Complete by:  As directed      Call MD for:  severe uncontrolled pain    Complete by:  As directed      Call MD for:  temperature >100.4    Complete by:  As directed  Diet - low sodium heart healthy    Complete by:  As directed      Diet Carb Modified    Complete by:  As directed      Increase activity slowly    Complete by:  As directed             Medication List    STOP taking these medications        calcium-vitamin D 250-100 MG-UNIT tablet     hydrocortisone 2.5 % lotion      TAKE these medications        albuterol 108 (90 Base) MCG/ACT inhaler  Commonly known as:  PROVENTIL HFA;VENTOLIN HFA  Inhale 2 puffs into the lungs every 6 (six) hours as needed for wheezing or shortness of breath.     amLODipine 10 MG tablet  Commonly known as:  NORVASC  Take 0.5 tablets (5 mg total) by  mouth daily.     aspirin EC 81 MG tablet  Take 1 tablet (81 mg total) by mouth daily.     atorvastatin 80 MG tablet  Commonly known as:  LIPITOR  Take 1 tablet (80 mg total) by mouth daily at 6 PM.     benzonatate 100 MG capsule  Commonly known as:  TESSALON  Take 1 capsule (100 mg total) by mouth 3 (three) times daily as needed for cough.     calcitRIOL 0.5 MCG capsule  Commonly known as:  ROCALTROL  Take 1 capsule (0.5 mcg total) by mouth daily.     calcium carbonate 1250 (500 Ca) MG tablet  Commonly known as:  OS-CAL - dosed in mg of elemental calcium  Take 1 tablet (500 mg of elemental calcium total) by mouth 3 (three) times daily with meals.     carvedilol 3.125 MG tablet  Commonly known as:  COREG  Take 1 tablet (3.125 mg total) by mouth 2 (two) times daily with a meal.     ceFAZolin 2-3 GM-% Solr  Commonly known as:  ANCEF  Inject 50 mLs (2 g total) into the vein every 12 (twelve) hours. Discontinue after 04/25/2015 doses.     clopidogrel 75 MG tablet  Commonly known as:  PLAVIX  Take 1 tablet (75 mg total) by mouth daily.     collagenase ointment  Commonly known as:  SANTYL  Apply topically daily. Apply to right side of gluteal cleft Unstageable Pressure Injury once daily and PRN soiling.     famotidine 20 MG tablet  Commonly known as:  PEPCID  Take 1 tablet (20 mg total) by mouth daily.     feeding supplement (NEPRO CARB STEADY) Liqd  Take 237 mLs by mouth 2 (two) times daily between meals.     fish oil-omega-3 fatty acids 1000 MG capsule  Take 1 g by mouth 3 (three) times daily. Three times daily     furosemide 20 MG tablet  Commonly known as:  LASIX  Take 1 tablet (20 mg total) by mouth daily.     guaiFENesin 600 MG 12 hr tablet  Commonly known as:  MUCINEX  Take 1 tablet (600 mg total) by mouth 2 (two) times daily.     insulin aspart 100 UNIT/ML injection  Commonly known as:  NOVOLOG  Before each meal 3 times a day, 140-199 - 2 units, 200-250 - 4  units, 251-299 - 6 units,  300-349 - 8 units,  350 or above 10 units. Dispense syringes and needles as needed, Ok to switch to  PEN if approved. Substitute to any brand approved. DX DM2, Code E11.65     Iron 325 (65 Fe) MG Tabs  Take 325 mg by mouth 3 (three) times daily.     isosorbide mononitrate 30 MG 24 hr tablet  Commonly known as:  IMDUR  Take 1 tablet (30 mg total) by mouth daily.     mometasone-formoterol 100-5 MCG/ACT Aero  Commonly known as:  DULERA  Inhale 1 puff into the lungs daily.     nitroGLYCERIN 0.4 MG SL tablet  Commonly known as:  NITROSTAT  Place 1 tablet (0.4 mg total) under the tongue every 5 (five) minutes as needed for chest pain.     sertraline 50 MG tablet  Commonly known as:  ZOLOFT  Take 1 tablet (50 mg total) by mouth daily.     sodium bicarbonate 650 MG tablet  Take 1 tablet (650 mg total) by mouth 2 (two) times daily.     tamsulosin 0.4 MG Caps capsule  Commonly known as:  FLOMAX  Take 1 capsule (0.4 mg total) by mouth daily.     Vitamin D (Ergocalciferol) 50000 units Caps capsule  Commonly known as:  DRISDOL  Take 1 capsule (50,000 Units total) by mouth every 7 (seven) days.  Start taking on:  04/14/2015       Follow-up Information    Follow up with [facility]. Go on 04/09/2015.   Specialty:  Internal Medicine   Why:  at 11:30am for an appointment in the [facility] with Dr Lisette Grinder information:   [id number] [place] [phone]      Follow up with [name] Basques, MD. Schedule an appointment as soon as possible for a visit in 3 weeks.   Specialty:  Infectious Diseases   Contact information:   [place] [address] [phone]       Follow up with [name] Landmark, MD. Schedule an appointment as soon as possible for a visit in 4 weeks.   Specialty:  Pulmonary Disease   Contact information:   [address] [place] [address] [phone]       Follow up with [facility], MD. Schedule an appointment as soon as possible for a visit in 2 weeks.   Specialty:  Nephrology   Contact information:   [place] [address] [phone]       Get Medicines reviewed and adjusted: Please take all your medications with you for your next visit with your Primary MD  Please request your Primary MD to go over all hospital tests and procedure/radiological results at the follow up. Please ask your Primary MD to get all Hospital records sent to his/her office.  If you experience worsening of your admission symptoms, develop shortness of breath, life threatening emergency, suicidal or homicidal thoughts you must seek medical attention immediately by calling 911 or calling your MD immediately if symptoms less severe.  You must read complete instructions/literature along with all the possible adverse reactions/side effects for all the Medicines you take and that have been prescribed to you. Take any new Medicines after you have completely understood and accept all the possible adverse reactions/side effects.   Do not drive when taking pain medications.  Do not take more than prescribed Pain, Sleep and Anxiety Medications  Special Instructions: If you have smoked or chewed Tobacco in the last 2 yrs please stop smoking, stop any regular Alcohol and or any Recreational drug use.  Wear Seat belts while driving.  Please note  You were cared for by a hospitalist during your hospital stay. Once you are discharged, your primary care physician will handle any further medical issues. Please note that NO REFILLS for any discharge medications will be authorized once you are discharged, as it is imperative that you return to your primary care physician (or establish a relationship with a primary care physician if you do not have one) for your aftercare  needs so that they can reassess your need for medications and monitor your lab values.    The results of significant diagnostics from this hospitalization (including imaging, microbiology, ancillary and laboratory) are listed below for reference.    Significant Diagnostic Studies: Dg Chest 2 View  04/04/2015  CLINICAL DATA:  Difficulty breathing the past few days. EXAM: CHEST  2 VIEW COMPARISON:  03/29/2015 FINDINGS: Persistent nodular airspace disease seen over the right upper and lower lobes. There is also a streaky lingular opacity. No evidence of progression by radiography. Stable cardiomegaly. Negative aortic and hilar contours. No effusion or air leak. IMPRESSION: Persisting nodular airspace disease over the right lung. Correlate with chest CT report 03/30/2015. Cardiomegaly without failure. Electronically Signed   By: Monte Fantasia M.D.   On: 04/04/2015 13:12   Ct Chest Wo Contrast  03/30/2015  CLINICAL DATA:  65 year old female with history of pulmonary infiltrates on recent chest x-ray. EXAM: CT CHEST WITHOUT CONTRAST TECHNIQUE: Multidetector CT imaging of the chest was performed following the standard protocol without IV contrast. COMPARISON:  Chest CT 12/14/2010.  Chest x-ray 10/16/2015. FINDINGS: Mediastinum/Lymph Nodes: Heart size is borderline enlarged. There is no significant pericardial fluid, thickening or pericardial calcification. There is atherosclerosis of the thoracic aorta, the great vessels of the mediastinum and the coronary arteries, including calcified atherosclerotic plaque in the left main, left anterior descending, left circumflex and right coronary arteries. Multiple enlarged mediastinal lymph nodes are noted measuring up to 16 mm in short axis in the right paratracheal nodal station. Prominent tissue in the hilar regions is also noted, which could suggest additional lymphadenopathy, although this is poorly evaluated on today's noncontrast CT examination. Esophagus is  unremarkable in appearance. No axillary lymphadenopathy. Lungs/Pleura: In the lungs bilaterally (right greater than left) there multiple nodular and mass-like areas of apparent airspace consolidation, largest of which is in the posterior aspect of the right lower lobe (image 38 of series 3) measuring 4.1 x 2.4 cm. The majority of these areas have some surrounding ground-glass attenuation. There is also some associated interlobular septal thickening in the lung bases bilaterally. No pleural effusions. Upper Abdomen: Status post cholecystectomy. Musculoskeletal/Soft Tissues: There are no aggressive appearing lytic or blastic lesions noted in the visualized portions of the skeleton. IMPRESSION: 1. Multiple nodular and mass-like areas of apparent airspace consolidation, asymmetrically distributed in the lungs bilaterally (right greater than left), which have an unusual appearance concerning for atypical infection. In particular, findings are concerning for potential fungal infection such as angioinvasive aspergillosis. 2. Associated with this there is mediastinal lymphadenopathy, as above. Although this could be reactive, the extensive lymphadenopathy is somewhat atypical for reactive lymphadenopathy, and accordingly, clinical correlation for signs and symptoms of lymphoproliferative disorder is recommended. 3. Atherosclerosis, including left main and 3 vessel coronary artery disease.  Please note that although the presence of coronary artery calcium documents the presence of coronary artery disease, the severity of this disease and any potential stenosis cannot be assessed on this non-gated CT examination. Assessment for potential risk factor modification, dietary therapy or pharmacologic therapy may be warranted, if clinically indicated. Electronically Signed   By: Vinnie Langton M.D.   On: 03/30/2015 15:28   Ir Fluoro Guide Cv Line Right  04/06/2015  INDICATION: 65 year old with chronic kidney disease and  bacteremia. Request for tunneled central venous catheter for antibiotics. EXAM: FLUOROSCOPIC AND ULTRASOUND GUIDED PLACEMENT OF A TUNNELED CENTRAL VENOUS CATHETER Physician: Stephan Minister. Anselm Pancoast, MD FLUOROSCOPY TIME:  12 seconds, 0.2 mGy MEDICATIONS: [name] is already on antibiotics. ANESTHESIA/SEDATION: None PROCEDURE: Informed consent was obtained for placement of a tunneled central venous catheter. The [name] was placed supine on the interventional table. Ultrasound confirmed a patent right internal jugularvein. Ultrasound images were obtained for documentation. The right side of the neck was prepped and draped in a sterile fashion. The right side of the neck was anesthetized with 1% lidocaine. Maximal barrier sterile technique was utilized including caps, mask, sterile gowns, sterile gloves, sterile drape, hand hygiene and skin antiseptic. A small incision was made with #11 blade scalpel. A 21 gauge needle directed into the right internal jugular vein with ultrasound guidance. A micropuncture dilator set was placed. A single lumen Powerline catheter was selected. The skin below the right clavicle was anesthetized and a small incision was made with an #11 blade scalpel. A subcutaneous tunnel was formed to the vein dermatotomy site. The catheter was brought through the tunnel. The vein dermatotomy site was dilated to accommodate a peel-away sheath. The catheter was placed through the peel-away sheath and directed into the central venous structures. The tip of the catheter was placed at the superior cavoatrial junction with fluoroscopy. Fluoroscopic images were obtained for documentation. Both lumens were found to aspirate and flush well. The vein dermatotomy site was closed using a single layer of absorbable suture and Dermabond. The catheter was secured to the skin using Prolene suture. FINDINGS: Catheter tip at the superior cavoatrial junction. COMPLICATIONS: None IMPRESSION: Successful placement of a right jugular  tunneled central venous catheter using ultrasound and fluoroscopic guidance. Electronically Signed   By: Markus Daft M.D.   On: 04/06/2015 17:08   Ir US Guide Vasc Access Right  04/06/2015  INDICATION: 65 year old with chronic kidney disease and bacteremia. Request for tunneled central venous catheter for antibiotics. EXAM: FLUOROSCOPIC AND ULTRASOUND GUIDED PLACEMENT OF A TUNNELED CENTRAL VENOUS CATHETER Physician: Stephan Minister. Anselm Pancoast, MD FLUOROSCOPY TIME:  12 seconds, 0.2 mGy MEDICATIONS: [name] is already on antibiotics. ANESTHESIA/SEDATION: None PROCEDURE: Informed consent was obtained for placement of a tunneled central venous catheter. The [name] was placed supine on the interventional table. Ultrasound confirmed a patent right internal jugularvein. Ultrasound images were obtained for documentation. The right side of the neck was prepped and draped in a sterile fashion. The right side of the neck was anesthetized with 1% lidocaine. Maximal barrier sterile technique was utilized including caps, mask, sterile gowns, sterile gloves, sterile drape, hand hygiene and skin antiseptic. A small incision was made with #11 blade scalpel. A 21 gauge needle directed into the right internal jugular vein with ultrasound guidance. A micropuncture dilator set was placed. A single lumen Powerline catheter was selected. The skin below the right clavicle was anesthetized and a small incision was made with an #11 blade scalpel. A subcutaneous tunnel was formed to the vein  dermatotomy site. The catheter was brought through the tunnel. The vein dermatotomy site was dilated to accommodate a peel-away sheath. The catheter was placed through the peel-away sheath and directed into the central venous structures. The tip of the catheter was placed at the superior cavoatrial junction with fluoroscopy. Fluoroscopic images were obtained for documentation. Both lumens were found to aspirate and flush well. The vein dermatotomy site was closed  using a single layer of absorbable suture and Dermabond. The catheter was secured to the skin using Prolene suture. FINDINGS: Catheter tip at the superior cavoatrial junction. COMPLICATIONS: None IMPRESSION: Successful placement of a right jugular tunneled central venous catheter using ultrasound and fluoroscopic guidance. Electronically Signed   By: Markus Daft M.D.   On: 04/06/2015 17:08   Dg Chest Portable 1 View  03/29/2015  CLINICAL DATA:  Short of breath EXAM: PORTABLE CHEST 1 VIEW COMPARISON:  03/25/2015 FINDINGS: Partial clearing of bilateral airspace disease with small areas of residual infiltrate bilaterally. No effusion. Heart size mildly enlarged. IMPRESSION: Partial clearing and bilateral airspace disease. Possible pneumonia versus edema. Electronically Signed   By: Franchot Gallo M.D.   On: 03/29/2015 12:04   Dg Chest Port 1 View  03/25/2015  CLINICAL DATA:  65 year old female with shortness of breath and weakness today. EXAM: PORTABLE CHEST 1 VIEW COMPARISON:  Chest x-ray 03/08/2015. FINDINGS: There is cephalization of the pulmonary vasculature and slight indistinctness of the interstitial markings suggestive of mild pulmonary edema. No definite pleural effusions. Heart size is mildly enlarged. The [name] is rotated to the left on today's exam, resulting in distortion of the mediastinal contours and reduced diagnostic sensitivity and specificity for mediastinal pathology. Atherosclerosis in the thoracic aorta. IMPRESSION: 1. The appearance the chest suggests congestive heart failure, as above. Electronically Signed   By: Vinnie Langton M.D.   On: 03/25/2015 17:50    Microbiology: Recent Results (from the past 240 hour(s))  Culture, blood (Routine X 2) w Reflex to ID Panel     Status: None   Collection Time: 03/29/15 12:40 PM  Result Value Ref Range Status   Specimen Description BLOOD RIGHT ANTECUBITAL  Final   Special Requests BOTTLES DRAWN AEROBIC AND ANAEROBIC 4MLS  Final   Culture  NO GROWTH 5 DAYS  Final   Report Status 04/03/2015 FINAL  Final  Culture, blood (Routine X 2) w Reflex to ID Panel     Status: None   Collection Time: 03/29/15 12:50 PM  Result Value Ref Range Status   Specimen Description BLOOD LEFT ANTECUBITAL  Final   Special Requests BOTTLES DRAWN AEROBIC AND ANAEROBIC 5CCS  Final   Culture  Setup Time   Final    GRAM POSITIVE COCCI IN CLUSTERS ANAEROBIC BOTTLE ONLY CRITICAL RESULT CALLED TO, READ BACK BY AND VERIFIED WITH: ANITE,RN ON 6E @0437  03/31/15 MKELLY    Culture STAPHYLOCOCCUS AUREUS  Final   Report Status 04/01/2015 FINAL  Final   Organism ID, Bacteria STAPHYLOCOCCUS AUREUS  Final      Susceptibility   Staphylococcus aureus - MIC*    CIPROFLOXACIN <=0.5 SENSITIVE Sensitive     ERYTHROMYCIN <=0.25 SENSITIVE Sensitive     GENTAMICIN <=0.5 SENSITIVE Sensitive     OXACILLIN 0.5 SENSITIVE Sensitive     TETRACYCLINE <=1 SENSITIVE Sensitive     VANCOMYCIN <=0.5 SENSITIVE Sensitive     TRIMETH/SULFA <=10 SENSITIVE Sensitive     CLINDAMYCIN <=0.25 SENSITIVE Sensitive     RIFAMPIN <=0.5 SENSITIVE Sensitive     Inducible Clindamycin NEGATIVE Sensitive     *  STAPHYLOCOCCUS AUREUS  Culture, blood (routine x 2)     Status: None   Collection Time: 03/31/15  7:20 PM  Result Value Ref Range Status   Specimen Description BLOOD LEFT ANTECUBITAL  Final   Special Requests BOTTLES DRAWN AEROBIC AND ANAEROBIC 5CC   Final   Culture NO GROWTH 5 DAYS  Final   Report Status 04/05/2015 FINAL  Final  Culture, blood (routine x 2)     Status: None   Collection Time: 03/31/15  7:30 PM  Result Value Ref Range Status   Specimen Description BLOOD RIGHT ANTECUBITAL  Final   Special Requests BOTTLES DRAWN AEROBIC AND ANAEROBIC 5CC   Final   Culture NO GROWTH 5 DAYS  Final   Report Status 04/05/2015 FINAL  Final     Labs: Basic Metabolic Panel:  Recent Labs Lab 04/02/15 0746 04/03/15 0607 04/04/15 0440 04/04/15 1410 04/05/15 0612 04/06/15 1119  04/07/15 0448  NA 142 147* 141  --  143 142 141  K 3.7 4.1 2.8*  --  3.8 3.5 3.2*  CL 106 110 108  --  109 104 107  CO2 21* 21* 21*  --  16* 22 22  GLUCOSE 91 60* 87  --  77 159* 96  BUN 45* 42* 36*  --  33* 31* 33*  CREATININE 2.98* 2.90* 2.68*  --  2.51* 2.61* 2.49*  CALCIUM 6.1* 6.4* 6.2*  --  6.6* 7.6* 7.6*  MG  --   --   --  1.4*  --  2.0  --   PHOS 2.5 3.5  --   --   --  3.2 3.2   Liver Function Tests:  Recent Labs Lab 04/02/15 0746 04/03/15 0607 04/06/15 1119 04/07/15 0448  ALBUMIN 1.8* 1.6* 1.5* 1.3*   No results for input(s): LIPASE, AMYLASE in the last 168 hours. No results for input(s): AMMONIA in the last 168 hours. CBC:  Recent Labs Lab 04/02/15 0746 04/04/15 1410 04/05/15 0612  WBC 11.9* 10.2 12.9*  NEUTROABS  --  9.0* 10.7*  HGB 9.6* 9.0* 9.4*  HCT 29.2* 27.4* 28.8*  MCV 88.8 89.0 87.0  PLT 341 337 341   Cardiac Enzymes: No results for input(s): CKTOTAL, CKMB, CKMBINDEX, TROPONINI in the last 168 hours. BNP: BNP (last 3 results)  Recent Labs  01/21/15 2041 03/25/15 1830 03/29/15 1043  BNP 833.4* 524.8* 384.2*    ProBNP (last 3 results) No results for input(s): PROBNP in the last 8760 hours.  CBG:  Recent Labs Lab 04/06/15 0836 04/06/15 1141 04/06/15 1651 04/07/15 0735 04/07/15 1138  GLUCAP 93 140* 186* 88 199*       Signed:  Larance Ratledge, MD, FACP, FHM. [facility] Pager [phone]  If 7PM-7AM, please contact night-coverage [email or web address] Password [email or web address] 04/07/2015, 1:05 PM

## 2015-04-07 NOTE — Care Management Note (Addendum)
Case Management Note  Patient Details  Name: DARENE NAPPI MRN: [id number] Date of Birth: 20-Dec-1950  Subjective/Objective:         CM following for progression and d/c planning.           Action/Plan: 04/07/2015 Met with [facility] re resumption of HHPT and HHOT, [place] working with pt re IV antibiotics and HHRN services for IV education and lab draws.   Expected Discharge Date:    04/07/2015              Expected Discharge Plan:  [place]  In-House Referral:  NA  Discharge planning Services  CM Consult, Medication Assistance  Post Acute Care Choice:  Home Health, Durable Medical Equipment Choice offered to:     DME Arranged:  IV pump/equipment DME Agency:  [place] Arranged:  RN, PT, OT, IV Antibiotics HH Agency:  [place]  Status of Service:  Completed, pt for d/c this pm and [facility] will begin services on 04/08/15 am with IV antibiotics and teaching.   Medicare Important Message Given:    Date Medicare IM Given:    Medicare IM give by:    Date Additional Medicare IM Given:    Additional Medicare Important Message give by:     If discussed at [place] of Stay Meetings, dates discussed:    Additional Comments:  Adron Bene, RN 04/07/2015, 12:42 PM

## 2015-04-08 ENCOUNTER — Telehealth: Payer: Self-pay

## 2015-04-08 ENCOUNTER — Encounter: Payer: Self-pay | Admitting: Cardiology

## 2015-04-08 MED FILL — CALCITRIOL 0.5 MCG CAPSULE: 0.5 | 30 days supply | Qty: 30 | Fill #0

## 2015-04-08 MED FILL — VIT D2 1.25 MG (50,000 UNIT: 1.25 MG | 28 days supply | Qty: 4 | Fill #0

## 2015-04-08 NOTE — Telephone Encounter (Signed)
[facility] Post-discharge Follow-Up Phone Call:  Date of Discharge: 04/07/15 Principal Discharge Diagnosis(es): Healthcare associated pneumonia with MSSA bacteremia Post-discharge Communication: Call made to patient to complete discharge follow-up phone call.  Completed call with patient, and patient agreeable to this Case Manager calling daughter, [name], to review patient's medication list.  Call placed to patient's daughter at [phone]; unable to reach.  Voicemail left requesting return call.    Please check all that apply:  X  Patient is knowledgeable of his/her condition(s) and/or treatment. ? Patient is caring for self at home.  X  Patient is receiving assist at home from family and/or caregiver. Family and/or caregiver is knowledgeable of patient's condition(s) and/or treatment. X  Patient is receiving home health (RN-IV antibiotics, PT, OT) services with [facility]. Patient indicated she received call from [place] and home health services to begin today at 1100.   Medication Reconciliation:  ? Medication list reviewed with patient. X  Patient obtained all discharge medications-No. Patient's daughter has not yet picked up discharge medications. Call placed to patient's daughter, [name], to review patient's medications since patient's daughter fills patient's medication box.  Unable to reach patient's daughter; voicemail left requesting return call.   Activities of Daily Living:  ? Independent X  Needs assist-Patient lives at home with her spouse and son. Her daughter, [name], lives two houses down and also assists patient with activities of daily living. Patient has the following home DME: cane, walker, home nebulizer, home oxygen. Oxygen supplier is [place]. ? [facility]    Community resources in place for patient:  ? None  X  Home Health/Home DME-Home Health RN (IV antibiotics- IV cefazolin (2g every 12 hours) until 04/25/15), PT,  OT services with [facility]. Patient indicated her spouse learned how to to administer home IV antibiotics while hospitalized. Patient indicated [facility] RN scheduled to come to patient's home today at 1100 to administer IV antibiotics and to reinforce IV antibiotic administration. She indicated her daughter, [name], will also learn how to administer antibiotics today.   ? Assisted Living ? Support Group        Questions/Concerns discussed: This Case Manager placed call to patient to check on status and to remind her of [facility] appointment.  Patient indicated she was doing "okay." She indicated she was "taking it easy" and had good home support. Patient denied shortness of breath or respiratory difficulty. Patient also denied rectal bleeding. Patient reminded of [facility] appointment on 04/09/15 at 1130 with Dr. Jarold Song. She indicated she would have her daughter bring her to her upcoming appointment. Patient indicated that this Case Manager could call her daughter, [name], and review discharge medications with her since patient's daughter fills patient's medication box.  Call placed to patient's daughter; unable to reach. Voicemail left requesting return call. Awaiting return call from patient's daughter.

## 2015-04-08 NOTE — Progress Notes (Signed)
Patient's daughter [name]) came in to have her mother's discharge paper.Discharged papers printed,explained and given to her.

## 2015-04-08 NOTE — Progress Notes (Signed)
Patient's daughter [name]) came in ,to have her mother's discharge papers today.Explained and reminded of their pending medical appointment.

## 2015-04-09 ENCOUNTER — Ambulatory Visit: Payer: Self-pay | Attending: Family Medicine | Admitting: Family Medicine

## 2015-04-09 ENCOUNTER — Encounter: Payer: Self-pay | Admitting: Family Medicine

## 2015-04-09 VITALS — BP 150/73 | HR 80 | Temp 97.5°F | Resp 15 | Ht <= 58 in | Wt 120.6 lb

## 2015-04-09 DIAGNOSIS — I10 Essential (primary) hypertension: Secondary | ICD-10-CM

## 2015-04-09 DIAGNOSIS — I129 Hypertensive chronic kidney disease with stage 1 through stage 4 chronic kidney disease, or unspecified chronic kidney disease: Secondary | ICD-10-CM | POA: Insufficient documentation

## 2015-04-09 DIAGNOSIS — D509 Iron deficiency anemia, unspecified: Secondary | ICD-10-CM

## 2015-04-09 DIAGNOSIS — F341 Dysthymic disorder: Secondary | ICD-10-CM

## 2015-04-09 DIAGNOSIS — N184 Chronic kidney disease, stage 4 (severe): Secondary | ICD-10-CM

## 2015-04-09 DIAGNOSIS — E1165 Type 2 diabetes mellitus with hyperglycemia: Secondary | ICD-10-CM

## 2015-04-09 DIAGNOSIS — E1122 Type 2 diabetes mellitus with diabetic chronic kidney disease: Secondary | ICD-10-CM | POA: Insufficient documentation

## 2015-04-09 DIAGNOSIS — Z794 Long term (current) use of insulin: Secondary | ICD-10-CM | POA: Insufficient documentation

## 2015-04-09 DIAGNOSIS — J449 Chronic obstructive pulmonary disease, unspecified: Secondary | ICD-10-CM | POA: Insufficient documentation

## 2015-04-09 DIAGNOSIS — L89153 Pressure ulcer of sacral region, stage 3: Secondary | ICD-10-CM

## 2015-04-09 DIAGNOSIS — J15211 Pneumonia due to Methicillin susceptible Staphylococcus aureus: Secondary | ICD-10-CM | POA: Insufficient documentation

## 2015-04-09 DIAGNOSIS — J189 Pneumonia, unspecified organism: Secondary | ICD-10-CM

## 2015-04-09 DIAGNOSIS — J438 Other emphysema: Secondary | ICD-10-CM

## 2015-04-09 DIAGNOSIS — Z7982 Long term (current) use of aspirin: Secondary | ICD-10-CM | POA: Insufficient documentation

## 2015-04-09 DIAGNOSIS — I5032 Chronic diastolic (congestive) heart failure: Secondary | ICD-10-CM

## 2015-04-09 DIAGNOSIS — Z79899 Other long term (current) drug therapy: Secondary | ICD-10-CM | POA: Insufficient documentation

## 2015-04-09 DIAGNOSIS — F419 Anxiety disorder, unspecified: Secondary | ICD-10-CM | POA: Insufficient documentation

## 2015-04-09 DIAGNOSIS — I25110 Atherosclerotic heart disease of native coronary artery with unstable angina pectoris: Secondary | ICD-10-CM

## 2015-04-09 DIAGNOSIS — J439 Emphysema, unspecified: Secondary | ICD-10-CM | POA: Insufficient documentation

## 2015-04-09 LAB — GLUCOSE, POCT (MANUAL RESULT ENTRY): POC Glucose: 170 mg/dl — AB (ref 70–99)

## 2015-04-09 LAB — BASIC METABOLIC PANEL
BUN: 31 mg/dL — AB (ref 7–25)
CHLORIDE: 108 mmol/L (ref 98–110)
CO2: 25 mmol/L (ref 20–31)
CREATININE: 2.04 mg/dL — AB (ref 0.50–0.99)
Calcium: 8 mg/dL — ABNORMAL LOW (ref 8.6–10.4)
GLUCOSE: 177 mg/dL — AB (ref 65–99)
Potassium: 4.5 mmol/L (ref 3.5–5.3)
Sodium: 142 mmol/L (ref 135–146)

## 2015-04-09 LAB — CBC WITH DIFFERENTIAL/PLATELET
BASOS ABS: 0 10*3/uL (ref 0.0–0.1)
Basophils Relative: 0 % (ref 0–1)
EOS PCT: 1 % (ref 0–5)
Eosinophils Absolute: 0.1 10*3/uL (ref 0.0–0.7)
HEMATOCRIT: 31 % — AB (ref 36.0–46.0)
Hemoglobin: 9.8 g/dL — ABNORMAL LOW (ref 12.0–15.0)
LYMPHS ABS: 0.8 10*3/uL (ref 0.7–4.0)
LYMPHS PCT: 6 % — AB (ref 12–46)
MCH: 28.9 pg (ref 26.0–34.0)
MCHC: 31.6 g/dL (ref 30.0–36.0)
MCV: 91.4 fL (ref 78.0–100.0)
MPV: 9.6 fL (ref 8.6–12.4)
Monocytes Absolute: 0.8 10*3/uL (ref 0.1–1.0)
Monocytes Relative: 6 % (ref 3–12)
Neutro Abs: 11.7 10*3/uL — ABNORMAL HIGH (ref 1.7–7.7)
Neutrophils Relative %: 87 % — ABNORMAL HIGH (ref 43–77)
Platelets: 358 10*3/uL (ref 150–400)
RBC: 3.39 MIL/uL — ABNORMAL LOW (ref 3.87–5.11)
RDW: 15.9 % — AB (ref 11.5–15.5)
WBC: 13.4 10*3/uL — ABNORMAL HIGH (ref 4.0–10.5)

## 2015-04-09 MED ORDER — ISOSORBIDE MONONITRATE ER 30 MG PO TB24: 30.0000 mg | ORAL_TABLET | Freq: Every day | ORAL | Status: DC

## 2015-04-09 MED ORDER — SODIUM BICARBONATE 650 MG PO TABS
650.0000 mg | ORAL_TABLET | Freq: Two times a day (BID) | ORAL | Status: DC
Start: 2015-04-09 — End: 2015-05-20

## 2015-04-09 MED ORDER — FUROSEMIDE 20 MG PO TABS: 20.0000 mg | ORAL_TABLET | Freq: Every day | ORAL | Status: DC

## 2015-04-09 MED ORDER — AMLODIPINE BESYLATE 5 MG PO TABS: 5.0000 mg | ORAL_TABLET | Freq: Every day | ORAL | Status: DC

## 2015-04-09 MED ORDER — CALCITRIOL 0.5 MCG PO CAPS: 0.5000 ug | ORAL_CAPSULE | Freq: Every day | ORAL | Status: DC

## 2015-04-09 MED ORDER — SERTRALINE HCL 50 MG PO TABS: 50.0000 mg | ORAL_TABLET | Freq: Every day | ORAL | Status: DC

## 2015-04-09 MED ORDER — CARVEDILOL 3.125 MG PO TABS: 3.1250 mg | ORAL_TABLET | Freq: Two times a day (BID) | ORAL | Status: DC

## 2015-04-09 MED FILL — ISOSORBIDE MN ER 30 MG TAB: 30 | 30 days supply | Qty: 30 | Fill #0

## 2015-04-09 MED FILL — SERTRALINE HCL 50 MG TABLET: 50 | 30 days supply | Qty: 30 | Fill #0

## 2015-04-09 NOTE — Progress Notes (Signed)
Patient feels better after leaving the hospital Her husband is helping with her antibiotic infusions She complains of a "sore " on her buttock Daughter has questions about which medications she should be on

## 2015-04-09 NOTE — Progress Notes (Signed)
[place]  Date of telephone encounter: 04/08/15  Admit date: 03/29/15 Discharge date: 04/07/15  PCP: Dr. Jarold Song  Subjective:  Patient ID: [name], female    DOB: [birth year]  Age: 65 y.o. MRN: [id number]  CC: Hospitalization Follow-up   HPI [name] is a 65 year old female with a history of type 2 diabetes mellitus. (Diet controlled with A1c of 6.2), hypertension, diastolic CHF (EF 99991111 from 2-D echo of 03/2015), chronic kidney disease stage IV, COPD, coronary artery disease recently hospitalized for chronic respiratory failure with hypoxia secondary to healthcare associated pneumonia with MSSA bacteremia  She was recently discharged from hospital on 03/26/15 after treatment for upper GI bleed. She underwent EGD during that admission which did not show evidence of active bleeding but there was a suspected distal intestinal ulcer or AVM. At that time she was also seen by cardiology secondary to chest discomfort and elevated troponins. She had a Myoview on 01/22/15 with intermediate risk, EF 25%. She was not felt to be a candidate for open heart surgery of Cardiac catheterization or PCI given her bleeding risk and renal failure. Hence cardiology recommended medical management and continued clopidogrel monotherapy.  She was treated with IV cefepime and vancomycinCT chest without contrast findings as below. Infectious disease was consulted. Her CT was felt to be unusual or concerning for septic emboli or could this be malignancy versus fungal infection. Pulmonology was consulted. She was felt to be high risk for bleeding and other complications for bronchoscopy while on Plavix. 2-D echo did not show any obvious vegetations.Final recommendations were to complete a prolonged course of IV cefazolin (end date 04/25/15). A right IJ tunnel catheter was placed as PICC line contraindicated due to chronic kidney disease.  Interval history She comes into the clinic accompanied by her  daughter and is currently on IV antibiotics. [facility] nursing services are provided by [facility] and she has upcoming appointments with infectious disease, pulmonology and her nephrologist.  Outpatient Prescriptions Prior to Visit  Medication Sig Dispense Refill  . albuterol (PROVENTIL HFA;VENTOLIN HFA) 108 (90 BASE) MCG/ACT inhaler Inhale 2 puffs into the lungs every 6 (six) hours as needed for wheezing or shortness of breath. 1 Inhaler 2  . atorvastatin (LIPITOR) 80 MG tablet Take 1 tablet (80 mg total) by mouth daily at 6 PM. 30 tablet 2  . ceFAZolin (ANCEF) 2-3 GM-% SOLR Inject 50 mLs (2 g total) into the vein every 12 (twelve) hours. Discontinue after 04/25/2015 doses. 1 each 0  . clopidogrel (PLAVIX) 75 MG tablet Take 1 tablet (75 mg total) by mouth daily. 30 tablet 2  . Ferrous Sulfate (IRON) 325 (65 FE) MG TABS Take 325 mg by mouth 3 (three) times daily. 90 each 2  . insulin aspart (NOVOLOG) 100 UNIT/ML injection Before each meal 3 times a day, 140-199 - 2 units, 200-250 - 4 units, 251-299 - 6 units,  300-349 - 8 units,  350 or above 10 units. Dispense syringes and needles as needed, Ok to switch to PEN if approved. Substitute to any brand approved. DX DM2, Code E11.65 1 vial 12  . mometasone-formoterol (DULERA) 100-5 MCG/ACT AERO Inhale 1 puff into the lungs daily. 1 Inhaler 2  . nitroGLYCERIN (NITROSTAT) 0.4 MG SL tablet Place 1 tablet (0.4 mg total) under the tongue every 5 (five) minutes as needed for chest pain. 10 tablet 0  . amLODipine (NORVASC) 10 MG tablet Take 0.5 tablets (5 mg total) by mouth daily. 30 tablet 0  .  carvedilol (COREG) 3.125 MG tablet Take 1 tablet (3.125 mg total) by mouth 2 (two) times daily with a meal.    . furosemide (LASIX) 20 MG tablet Take 1 tablet (20 mg total) by mouth daily. 30 tablet 0  . isosorbide mononitrate (IMDUR) 30 MG 24 hr tablet Take 1 tablet (30 mg total) by mouth daily. 30 tablet 2  . sodium bicarbonate 650 MG tablet Take 1 tablet (650 mg  total) by mouth 2 (two) times daily. 30 tablet 0  . benzonatate (TESSALON) 100 MG capsule Take 1 capsule (100 mg total) by mouth 3 (three) times daily as needed for cough. (Patient not taking: Reported on 04/09/2015) 20 capsule 0  . calcium carbonate (OS-CAL - DOSED IN MG OF ELEMENTAL CALCIUM) 1250 (500 Ca) MG tablet Take 1 tablet (500 mg of elemental calcium total) by mouth 3 (three) times daily with meals. (Patient not taking: Reported on 04/09/2015) 90 tablet 0  . collagenase (SANTYL) ointment Apply topically daily. Apply to right side of gluteal cleft Unstageable Pressure Injury once daily and PRN soiling. (Patient not taking: Reported on 04/09/2015) 15 g 0  . fish oil-omega-3 fatty acids 1000 MG capsule Take 1 g by mouth 3 (three) times daily. Reported on 04/09/2015    . Nutritional Supplements (FEEDING SUPPLEMENT, NEPRO CARB STEADY,) LIQD Take 237 mLs by mouth 2 (two) times daily between meals. (Patient not taking: Reported on 04/09/2015)    . [START ON 04/14/2015] Vitamin D, Ergocalciferol, (DRISDOL) 50000 units CAPS capsule Take 1 capsule (50,000 Units total) by mouth every 7 (seven) days. (Patient not taking: Reported on 04/09/2015) 4 capsule 0  . aspirin EC 81 MG tablet Take 1 tablet (81 mg total) by mouth daily. (Patient not taking: Reported on 04/09/2015) 30 tablet 2  . calcitRIOL (ROCALTROL) 0.5 MCG capsule Take 1 capsule (0.5 mcg total) by mouth daily. (Patient not taking: Reported on 04/09/2015) 30 capsule 0  . famotidine (PEPCID) 20 MG tablet Take 1 tablet (20 mg total) by mouth daily. (Patient not taking: Reported on 04/09/2015)    . guaiFENesin (MUCINEX) 600 MG 12 hr tablet Take 1 tablet (600 mg total) by mouth 2 (two) times daily. (Patient not taking: Reported on 04/09/2015) 30 tablet 0  . sertraline (ZOLOFT) 50 MG tablet Take 1 tablet (50 mg total) by mouth daily. (Patient not taking: Reported on 04/09/2015) 30 tablet 3  . tamsulosin (FLOMAX) 0.4 MG CAPS capsule Take 1 capsule (0.4 mg total) by  mouth daily. (Patient not taking: Reported on 04/09/2015) 30 capsule    No facility-administered medications prior to visit.    ROS Review of Systems  Constitutional: Negative for activity change, appetite change and fatigue.  HENT: Negative for congestion, sinus pressure and sore throat.   Eyes: Negative for visual disturbance.  Respiratory: Negative for cough, chest tightness, shortness of breath and wheezing.   Cardiovascular: Negative for chest pain and palpitations.  Gastrointestinal: Negative for abdominal pain, constipation and abdominal distention.  Endocrine: Negative for polydipsia.  Genitourinary: Negative for dysuria and frequency.  Musculoskeletal: Negative for back pain and arthralgias.  Skin: Positive for wound. Negative for rash.  Neurological: Negative for tremors, light-headedness and numbness.  Hematological: Does not bruise/bleed easily.  Psychiatric/Behavioral: Negative for behavioral problems and agitation.    Objective:  BP 150/73 mmHg  Pulse 80  Temp(Src) 97.5 F (36.4 C)  Resp 15  Ht 4\' 10"  (1.473 m)  Wt 120 lb 9.6 oz (54.704 kg)  BMI 25.21 kg/m2  SpO2 98%  BP/Weight 04/09/2015 04/07/2015 Q000111Q  Systolic BP Q000111Q A999333 -  Diastolic BP 73 49 -  Wt. (Lbs) 120.6 - 139.33  BMI 25.21 - 29.13      Physical Exam  Constitutional: She is oriented to person, place, and time. She appears well-developed and well-nourished.  Cardiovascular: Normal rate, normal heart sounds and intact distal pulses.   No murmur heard. Pulmonary/Chest: Effort normal and breath sounds normal. She has no wheezes. She has no rales. She exhibits no tenderness.  Right IJ port, no evidence of infection  Abdominal: Soft. Bowel sounds are normal. She exhibits no distension and no mass. There is no tenderness.  Musculoskeletal: Normal range of motion.  Neurological: She is alert and oriented to person, place, and time.  Skin:  Stage III sacral decubitus measuring 2 x 3 cm with  yellowish tissue on base of ulcer Stage I sacral ulcer on bilateral aspects of buttock    CBC Latest Ref Rng 04/05/2015 04/04/2015 04/02/2015  WBC 4.0 - 10.5 K/uL 12.9(H) 10.2 11.9(H)  Hemoglobin 12.0 - 15.0 g/dL 9.4(L) 9.0(L) 9.6(L)  Hematocrit 36.0 - 46.0 % 28.8(L) 27.4(L) 29.2(L)  Platelets 150 - 400 K/uL 341 337 341     Assessment & Plan:   1. Type 2 diabetes mellitus with hyperglycemia, with long-term current use of insulin (HCC) Diet controlled with A1c of 6.2 and 02/2015 - Glucose (CBG)  2. ANXIETY DEPRESSION She has been off her Zoloft which I have refilled - sertraline (ZOLOFT) 50 MG tablet; Take 1 tablet (50 mg total) by mouth daily.  Dispense: 30 tablet; Refill: 3  3. Essential hypertension Elevated blood pressure due to the fact that she has not been taking her carvedilol which I have refilled today - carvedilol (COREG) 3.125 MG tablet; Take 1 tablet (3.125 mg total) by mouth 2 (two) times daily with a meal. - amLODipine (NORVASC) 5 MG tablet; Take 1 tablet (5 mg total) by mouth daily.  Dispense: 30 tablet; Refill: 2  4. Chronic diastolic CHF (congestive heart failure) (HCC) EF 0000000, grade 2 diastolic dysfunction from 2-D echo of 03/2015 - furosemide (LASIX) 20 MG tablet; Take 1 tablet (20 mg total) by mouth daily.  Dispense: 30 tablet; Refill: 0 - isosorbide mononitrate (IMDUR) 30 MG 24 hr tablet; Take 1 tablet (30 mg total) by mouth daily.  Dispense: 30 tablet; Refill: 2  5. Coronary artery disease involving native coronary artery of native heart with unstable angina pectoris (HCC) Stable Continue Plavix  6. Other emphysema ([place]) Currently on Dulera Advised to keep appointment with pulmonologist  7. Iron deficiency anemia Continue for sulfate  8. HCAP (healthcare-associated pneumonia) Currently on IV cefazolin until 04/25/15 CBC and basic metabolic panel today. Has an upcoming appointment with infectious disease Will need to be referred to interventional  radiology after completion of antibiotics for removal of tunneled IJ catheter  9. CKD (chronic kidney disease), stage IV ([place]) Managed by nephrology-has an upcoming appointment - sodium bicarbonate 650 MG tablet; Take 1 tablet (650 mg total) by mouth 2 (two) times daily.  Dispense: 30 tablet; Refill: 0 - calcitRIOL (ROCALTROL) 0.5 MCG capsule; Take 1 capsule (0.5 mcg total) by mouth daily.  Dispense: 30 capsule; Refill: 0  10. Sacral ulcer, stage I and III Wet-to-dry dressing applied to stage III ulcer in the clinic. Patient and caregiver educated about 2 hourly turning and timely cleaning up if the patient has an accident. We will get in touch with [facility] to verify that they have providing wound care  services as well.  I personally reviewed all her medications and updated her med list.  Meds ordered this encounter  Medications  . sertraline (ZOLOFT) 50 MG tablet    Sig: Take 1 tablet (50 mg total) by mouth daily.    Dispense:  30 tablet    Refill:  3  . carvedilol (COREG) 3.125 MG tablet    Sig: Take 1 tablet (3.125 mg total) by mouth 2 (two) times daily with a meal.  . sodium bicarbonate 650 MG tablet    Sig: Take 1 tablet (650 mg total) by mouth 2 (two) times daily.    Dispense:  30 tablet    Refill:  0  . amLODipine (NORVASC) 5 MG tablet    Sig: Take 1 tablet (5 mg total) by mouth daily.    Dispense:  30 tablet    Refill:  2    Discontinue previous dose  . furosemide (LASIX) 20 MG tablet    Sig: Take 1 tablet (20 mg total) by mouth daily.    Dispense:  30 tablet    Refill:  0  . isosorbide mononitrate (IMDUR) 30 MG 24 hr tablet    Sig: Take 1 tablet (30 mg total) by mouth daily.    Dispense:  30 tablet    Refill:  2  . calcitRIOL (ROCALTROL) 0.5 MCG capsule    Sig: Take 1 capsule (0.5 mcg total) by mouth daily.    Dispense:  30 capsule    Refill:  0    Follow-up: Return in about 1 week (around 04/16/2015) for TCC- follow up on Pneumonia.   Arnoldo Morale  MD

## 2015-04-10 ENCOUNTER — Telehealth: Payer: Self-pay | Admitting: *Deleted

## 2015-04-10 ENCOUNTER — Telehealth: Payer: Self-pay

## 2015-04-10 MED ORDER — CALCITRIOL 0.5 MCG PO CAPS: 0.5000 ug | ORAL_CAPSULE | Freq: Every day | ORAL | Status: DC

## 2015-04-10 NOTE — Telephone Encounter (Signed)
Verified name and date of birth and relayed results.

## 2015-04-10 NOTE — Addendum Note (Signed)
Addended by: Nicoletta Ba A on: 04/10/2015 11:34 AM   Modules accepted: Orders

## 2015-04-10 NOTE — Telephone Encounter (Signed)
-----   Message from Arnoldo Morale, MD sent at 04/10/2015  2:01 PM EST ----- Renal function and anemia have improved slightly.

## 2015-04-10 NOTE — Telephone Encounter (Signed)
This Case Manager spoke with Dr. Jarold Song who indicated she identified a stage II-III sacral wound as well as a new sacral wound forming on patient. She also indicated patient had fecal incontinence while in office. Dr. Jarold Song wanted to determine [place] providing wound care.   Call placed to [place] [phone]) and spoke with Otila Kluver who indicated her records show [facility] RN aware of one sacral wound, but she will contact patient's [facility] RN and have [facility] RN reassess sacral area. She also indicated WOCN can be consulted to prevent additional skin breakdown and incontinence supplies will be ordered for patient. Report or request for new orders will be sent to Dr. Jarold Song once evaluation complete.

## 2015-04-15 ENCOUNTER — Telehealth: Payer: Self-pay

## 2015-04-15 NOTE — Telephone Encounter (Signed)
Attempted to contact the patient to check on her status and to confirm her appointment for tomorrow , 04/16/15 @ 0930 at the [facility] at the [facility]. Calls placed to # [phone] (H) and the phone rang endlessly with no option to leave a message. Call then placed to # [phone] (M) and a HIPAA compliant voice mail message was left requesting a call back to # [phone] or [phone].

## 2015-04-16 ENCOUNTER — Ambulatory Visit: Payer: Self-pay | Admitting: Family Medicine

## 2015-04-17 ENCOUNTER — Telehealth: Payer: Self-pay

## 2015-04-17 NOTE — Telephone Encounter (Signed)
This Case Manager placed call to patient to check on status. Patient indicated she was doing well and did not have any health concerns at the time. She denied shortness of breath as long as she was wearing her home oxygen. She indicated her spouse just got all of her portable oxygen tanks refilled. She also indicated she continues to have [facility] services with [place], and a [facility] RN visit is planned today at 1200. Patient reminded of her upcoming appointment on 04/21/15 at 1530 with Dr. Jarold Song. Patient aware of appointment and indicated she planned to be at her appointment. No transportation barriers identified. No additional needs/concerns identified.

## 2015-04-20 ENCOUNTER — Telehealth: Payer: Self-pay

## 2015-04-20 NOTE — Telephone Encounter (Signed)
Attempted to contact the patient to confirm her appointment for tomorrow, 04/21/15 @ 1530. Call placed to # [phone] (H) and the phone rang without an option to leave a voice mail message.  Call then placed to #  [phone] (M) and the patient's daughter, [name], answered. As per Carmela Hurt, RN with the [facility] at the [facility], the patient has given permission for the CM speak to [facility].  Informed Tonya of the appointment scheduled for tomorrow. She stated that her mother is doing "good" and appreciated the call.

## 2015-04-21 ENCOUNTER — Encounter: Payer: Self-pay | Admitting: Family Medicine

## 2015-04-21 ENCOUNTER — Ambulatory Visit: Payer: Self-pay | Attending: Family Medicine | Admitting: Family Medicine

## 2015-04-21 VITALS — BP 161/74 | HR 89 | Temp 98.1°F | Resp 15 | Ht <= 58 in | Wt 132.2 lb

## 2015-04-21 DIAGNOSIS — F329 Major depressive disorder, single episode, unspecified: Secondary | ICD-10-CM | POA: Insufficient documentation

## 2015-04-21 DIAGNOSIS — E785 Hyperlipidemia, unspecified: Secondary | ICD-10-CM | POA: Insufficient documentation

## 2015-04-21 DIAGNOSIS — Z87891 Personal history of nicotine dependence: Secondary | ICD-10-CM | POA: Insufficient documentation

## 2015-04-21 DIAGNOSIS — J438 Other emphysema: Secondary | ICD-10-CM

## 2015-04-21 DIAGNOSIS — D638 Anemia in other chronic diseases classified elsewhere: Secondary | ICD-10-CM | POA: Insufficient documentation

## 2015-04-21 DIAGNOSIS — I10 Essential (primary) hypertension: Secondary | ICD-10-CM | POA: Insufficient documentation

## 2015-04-21 DIAGNOSIS — J189 Pneumonia, unspecified organism: Secondary | ICD-10-CM

## 2015-04-21 DIAGNOSIS — I5032 Chronic diastolic (congestive) heart failure: Secondary | ICD-10-CM | POA: Insufficient documentation

## 2015-04-21 DIAGNOSIS — I739 Peripheral vascular disease, unspecified: Secondary | ICD-10-CM | POA: Insufficient documentation

## 2015-04-21 DIAGNOSIS — I701 Atherosclerosis of renal artery: Secondary | ICD-10-CM | POA: Insufficient documentation

## 2015-04-21 DIAGNOSIS — I25110 Atherosclerotic heart disease of native coronary artery with unstable angina pectoris: Secondary | ICD-10-CM | POA: Insufficient documentation

## 2015-04-21 DIAGNOSIS — J449 Chronic obstructive pulmonary disease, unspecified: Secondary | ICD-10-CM | POA: Insufficient documentation

## 2015-04-21 DIAGNOSIS — I129 Hypertensive chronic kidney disease with stage 1 through stage 4 chronic kidney disease, or unspecified chronic kidney disease: Secondary | ICD-10-CM | POA: Insufficient documentation

## 2015-04-21 DIAGNOSIS — Z936 Other artificial openings of urinary tract status: Secondary | ICD-10-CM

## 2015-04-21 DIAGNOSIS — E119 Type 2 diabetes mellitus without complications: Secondary | ICD-10-CM

## 2015-04-21 DIAGNOSIS — E1122 Type 2 diabetes mellitus with diabetic chronic kidney disease: Secondary | ICD-10-CM | POA: Insufficient documentation

## 2015-04-21 DIAGNOSIS — N184 Chronic kidney disease, stage 4 (severe): Secondary | ICD-10-CM | POA: Insufficient documentation

## 2015-04-21 DIAGNOSIS — F0390 Unspecified dementia without behavioral disturbance: Secondary | ICD-10-CM | POA: Insufficient documentation

## 2015-04-21 DIAGNOSIS — Z79899 Other long term (current) drug therapy: Secondary | ICD-10-CM | POA: Insufficient documentation

## 2015-04-21 DIAGNOSIS — E1165 Type 2 diabetes mellitus with hyperglycemia: Secondary | ICD-10-CM | POA: Insufficient documentation

## 2015-04-21 DIAGNOSIS — Z960 Presence of urogenital implants: Secondary | ICD-10-CM | POA: Insufficient documentation

## 2015-04-21 DIAGNOSIS — J15211 Pneumonia due to Methicillin susceptible Staphylococcus aureus: Secondary | ICD-10-CM | POA: Insufficient documentation

## 2015-04-21 DIAGNOSIS — L89159 Pressure ulcer of sacral region, unspecified stage: Secondary | ICD-10-CM | POA: Insufficient documentation

## 2015-04-21 DIAGNOSIS — Z794 Long term (current) use of insulin: Secondary | ICD-10-CM | POA: Insufficient documentation

## 2015-04-21 DIAGNOSIS — F419 Anxiety disorder, unspecified: Secondary | ICD-10-CM | POA: Insufficient documentation

## 2015-04-21 DIAGNOSIS — L89152 Pressure ulcer of sacral region, stage 2: Secondary | ICD-10-CM

## 2015-04-21 LAB — CBC WITH DIFFERENTIAL/PLATELET
Basophils Absolute: 0.1 10*3/uL (ref 0.0–0.1)
Basophils Relative: 2 % — ABNORMAL HIGH (ref 0–1)
EOS PCT: 9 % — AB (ref 0–5)
Eosinophils Absolute: 0.5 10*3/uL (ref 0.0–0.7)
HEMATOCRIT: 29 % — AB (ref 36.0–46.0)
Hemoglobin: 9.3 g/dL — ABNORMAL LOW (ref 12.0–15.0)
LYMPHS ABS: 0.8 10*3/uL (ref 0.7–4.0)
LYMPHS PCT: 15 % (ref 12–46)
MCH: 29.2 pg (ref 26.0–34.0)
MCHC: 32.1 g/dL (ref 30.0–36.0)
MCV: 91.2 fL (ref 78.0–100.0)
MONO ABS: 0.6 10*3/uL (ref 0.1–1.0)
MPV: 9.8 fL (ref 8.6–12.4)
Monocytes Relative: 11 % (ref 3–12)
Neutro Abs: 3.5 10*3/uL (ref 1.7–7.7)
Neutrophils Relative %: 63 % (ref 43–77)
Platelets: 291 10*3/uL (ref 150–400)
RBC: 3.18 MIL/uL — ABNORMAL LOW (ref 3.87–5.11)
RDW: 16.2 % — AB (ref 11.5–15.5)
WBC: 5.6 10*3/uL (ref 4.0–10.5)

## 2015-04-21 LAB — COMPLETE METABOLIC PANEL WITH GFR
ALBUMIN: 2.5 g/dL — AB (ref 3.6–5.1)
ALK PHOS: 123 U/L (ref 33–130)
AST: 11 U/L (ref 10–35)
BUN: 23 mg/dL (ref 7–25)
CALCIUM: 8.4 mg/dL — AB (ref 8.6–10.4)
CHLORIDE: 105 mmol/L (ref 98–110)
CO2: 24 mmol/L (ref 20–31)
CREATININE: 1.64 mg/dL — AB (ref 0.50–0.99)
GFR, EST AFRICAN AMERICAN: 38 mL/min — AB (ref >=60)
GFR, Est Non African American: 33 mL/min — ABNORMAL LOW (ref >=60)
Glucose, Bld: 85 mg/dL (ref 65–99)
POTASSIUM: 3.7 mmol/L (ref 3.5–5.3)
Sodium: 141 mmol/L (ref 135–146)
Total Bilirubin: 0.2 mg/dL (ref 0.2–1.2)
Total Protein: 6.2 g/dL (ref 6.1–8.1)

## 2015-04-21 LAB — GLUCOSE, POCT (MANUAL RESULT ENTRY): POC Glucose: 90 mg/dl (ref 70–99)

## 2015-04-21 MED ORDER — ACCU-CHEK SOFTCLIX LANCET DEV MISC: Status: DC

## 2015-04-21 MED ORDER — FUROSEMIDE 40 MG PO TABS: 40.0000 mg | ORAL_TABLET | Freq: Every day | ORAL | Status: DC

## 2015-04-21 MED ORDER — AMLODIPINE BESYLATE 10 MG PO TABS: 10.0000 mg | ORAL_TABLET | Freq: Every day | ORAL | Status: DC

## 2015-04-21 MED ORDER — ACCU-CHEK AVIVA DEVI: Status: DC

## 2015-04-21 MED ORDER — GLUCOSE BLOOD VI STRP: ORAL_STRIP | Status: DC

## 2015-04-21 MED FILL — AMLODIPINE BESYLATE 10 MG T: 10 | 30 days supply | Qty: 30 | Fill #0

## 2015-04-21 NOTE — Patient Instructions (Signed)

## 2015-04-21 NOTE — Progress Notes (Signed)
Patient reports feeling much better Her appetite has improved No refills needed

## 2015-04-21 NOTE — Progress Notes (Signed)
[place]  Date of telephone encounter: 04/08/15  Admit date: 03/29/15 Discharge date: 04/07/15  PCP: Dr. Jarold Song   HPI: [name] is a 65 y.o. female  with a history of type 2 diabetes mellitus. (Diet controlled with A1c of 6.2), hypertension, diastolic CHF (EF 99991111 from 2-D echo of 03/2015), chronic kidney disease stage IV, COPD, coronary artery disease recently hospitalized for chronic respiratory failure with hypoxia secondary to healthcare associated pneumonia with MSSA bacteremia who has been on IV Cefazolin.  She also has a sacral decubitus which is being managed by [facility] along with her IV infusions and she reports doing well and will be receiving the last dose of antibiotics today even though her chart indicates she should be ending on 04/25/15. She complains of bilateral pedal edema but denies any shortness of breath at rest but does have some shortness of breath on mild exertion and has to use oxygen at night for her COPD.  Her blood pressure is elevated despite compliance with all her medications. Patient has No headache, No chest pain, No abdominal pain - No Nausea, No new weakness tingling or numbness, No Cough.  No Known Allergies Past Medical History  Diagnosis Date  . Peripheral vascular disease ([place])     a.s /p left common iliac and external iliac artery stents. b. H/o subclavian, mesenteric, and celiac artery stenosis (see below).  . Diabetes mellitus   . COPD (chronic obstructive pulmonary disease) (HCC)     home oxygen  . Hyperlipidemia   . Depression   . Chronic diastolic CHF (congestive heart failure) ([place])   . Essential hypertension   . Renal artery stenosis ([place])     a. Duplex 2014: no obvious evidence of hemodynamically significant stenosis >60%, bilateral intrarenal arteries exhibit absent diastolic flow. There is evidence of elevated velocities of the mid aorta. 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    [name] CAD (coronary artery disease)   . Iron deficiency anemia   . Diabetic retinopathy ([place])   . 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 Carotid artery disease ([place])     a. Duplex 12/2014: left CEA patent with elevated velocities, 60-79% RICA (stable over serial exams), 123456 LICA stenosis (stable over serial exams), 50% LECA, elevated velocities in bilateral subclavian arteries.  . Hypertensive heart disease with congestive heart failure ([place])   . CKD (chronic kidney disease), stage III   . Tobacco abuse   . Bradycardia     a. Limiting BB titration.  . Anemia, chronic disease 05/16/2008    Qualifier: Diagnosis of  By: Percival Spanish, MD, Farrel Gordon    . Dementia   . Protein-calorie malnutrition ([place]) 01/24/2015   Current Outpatient Prescriptions on File Prior to Visit  Medication Sig Dispense Refill  . albuterol (PROVENTIL HFA;VENTOLIN HFA) 108 (90 BASE) MCG/ACT inhaler Inhale 2 puffs into the lungs every 6 (six) hours as needed for wheezing or shortness of breath. 1 Inhaler 2  . amLODipine (NORVASC) 5 MG tablet Take 1 tablet (5 mg total) by mouth daily. 30 tablet 2  . atorvastatin (LIPITOR) 80 MG tablet Take 1 tablet (80 mg total) by mouth daily at 6 PM. 30 tablet 2  . benzonatate (TESSALON) 100 MG capsule Take 1 capsule (100 mg total) by mouth 3 (three) times daily as needed for cough. 20 capsule 0  . calcitRIOL (ROCALTROL)  0.5 MCG capsule Take 1 capsule (0.5 mcg total) by mouth daily. 30 capsule 0  . calcium carbonate (OS-CAL - DOSED IN MG OF ELEMENTAL CALCIUM) 1250 (500 Ca) MG tablet Take 1 tablet (500 mg of elemental calcium total) by mouth 3 (three) times daily with meals. 90 tablet 0  . carvedilol (COREG) 3.125 MG tablet Take 1 tablet (3.125 mg total) by mouth 2 (two) times daily with a meal.    . ceFAZolin (ANCEF) 2-3 GM-% SOLR Inject 50 mLs (2 g total) into the vein every 12  (twelve) hours. Discontinue after 04/25/2015 doses. 1 each 0  . clopidogrel (PLAVIX) 75 MG tablet Take 1 tablet (75 mg total) by mouth daily. 30 tablet 2  . collagenase (SANTYL) ointment Apply topically daily. Apply to right side of gluteal cleft Unstageable Pressure Injury once daily and PRN soiling. 15 g 0  . Ferrous Sulfate (IRON) 325 (65 FE) MG TABS Take 325 mg by mouth 3 (three) times daily. 90 each 2  . fish oil-omega-3 fatty acids 1000 MG capsule Take 1 g by mouth 3 (three) times daily. Reported on 04/09/2015    . furosemide (LASIX) 20 MG tablet Take 1 tablet (20 mg total) by mouth daily. 30 tablet 0  . insulin aspart (NOVOLOG) 100 UNIT/ML injection Before each meal 3 times a day, 140-199 - 2 units, 200-250 - 4 units, 251-299 - 6 units,  300-349 - 8 units,  350 or above 10 units. Dispense syringes and needles as needed, Ok to switch to PEN if approved. Substitute to any brand approved. DX DM2, Code E11.65 1 vial 12  . isosorbide mononitrate (IMDUR) 30 MG 24 hr tablet Take 1 tablet (30 mg total) by mouth daily. 30 tablet 2  . mometasone-formoterol (DULERA) 100-5 MCG/ACT AERO Inhale 1 puff into the lungs daily. 1 Inhaler 2  . nitroGLYCERIN (NITROSTAT) 0.4 MG SL tablet Place 1 tablet (0.4 mg total) under the tongue every 5 (five) minutes as needed for chest pain. 10 tablet 0  . Nutritional Supplements (FEEDING SUPPLEMENT, NEPRO CARB STEADY,) LIQD Take 237 mLs by mouth 2 (two) times daily between meals.    . sertraline (ZOLOFT) 50 MG tablet Take 1 tablet (50 mg total) by mouth daily. 30 tablet 3  . sodium bicarbonate 650 MG tablet Take 1 tablet (650 mg total) by mouth 2 (two) times daily. 30 tablet 0  . Vitamin D, Ergocalciferol, (DRISDOL) 50000 units CAPS capsule Take 1 capsule (50,000 Units total) by mouth every 7 (seven) days. 4 capsule 0  . [DISCONTINUED] loratadine (CLARITIN) 10 MG tablet Take 10 mg by mouth daily.       No current facility-administered medications on file prior to visit.    Family History  Problem Relation Age of Onset  . Hypertension Mother   . Heart attack Mother   . Hypertension Father   . Angina Father   . Cancer Sister   . Cancer Brother   . Stroke Neg Hx    Social History   Social History  . Marital Status: Married    Spouse Name: N/A  . Number of Children: N/A  . Years of Education: N/A   Occupational History  . Not on file.   Social History Main Topics  . Smoking status: Former Smoker -- 0.00 packs/day for 36 years    Types: Cigarettes    Quit date: 02/11/2015  . Smokeless tobacco: Never Used     Comment: uses e-cig  . Alcohol Use: No  . Drug  Use: No  . Sexual Activity: No   Other Topics Concern  . Not on file   Social History Narrative    Review of Systems: Constitutional: Negative for fever, chills, diaphoresis, activity change, appetite change and fatigue. HENT: Negative for ear pain, nosebleeds, congestion, facial swelling, rhinorrhea, neck pain, neck stiffness and ear discharge.  Eyes: Negative for pain, discharge, redness, itching and visual disturbance. Respiratory: Negative for cough, choking, chest tightness, +shortness of breath on mild exertion, negative for wheezing and stridor.  Cardiovascular: Negative for chest pain, palpitations and +leg swelling. Gastrointestinal: Negative for abdominal distention. Genitourinary: Negative for dysuria, urgency, frequency, hematuria, flank pain, decreased urine volume, difficulty urinating and dyspareunia.  Musculoskeletal: Negative for back pain, joint swelling, arthralgias and gait problem. Neurological: Negative for dizziness, tremors, seizures, syncope, facial asymmetry, speech difficulty, weakness, light-headedness, numbness and headaches.  Hematological: Negative for adenopathy. Does not bruise/bleed easily. Psychiatric/Behavioral: Negative for hallucinations, behavioral problems, confusion, dysphoric mood, decreased concentration and agitation.    Objective:   Filed  Vitals:   04/21/15 1533  BP: 161/74  Pulse: 89  Temp: 98.1 F (36.7 C)  Resp: 15    Physical Exam: Constitutional: Patient appears well-developed and well-nourished. No distress. CVS: RRR, S1/S2 +, no murmurs, no gallops, no carotid bruit.  Pulmonary: Effort and breath sounds normal, no stridor, rhonchi, wheezes, rales, right IJ tunneled catheter Abdominal: Soft. BS +,  no distension, tenderness, rebound or guarding.  Musculoskeletal: Normal range of motion. 1+ bilateral pedal edema and no tenderness.  Lymphadenopathy: No lymphadenopathy noted, cervical, inguinal or axillary Neuro: Alert. Normal reflexes, muscle tone coordination. No cranial nerve deficit. Skin: Stage II sacral decubitus ulcer with no tunneling, granulation tissue in the base and healing well, no sign of infection.  Psychiatric: Normal mood and affect. Behavior, judgment, thought content normal.  Lab Results  Component Value Date   WBC 13.4* 04/09/2015   HGB 9.8* 04/09/2015   HCT 31.0* 04/09/2015   MCV 91.4 04/09/2015   PLT 358 04/09/2015   Lab Results  Component Value Date   CREATININE 2.04* 04/09/2015   BUN 31* 04/09/2015   NA 142 04/09/2015   K 4.5 04/09/2015   CL 108 04/09/2015   CO2 25 04/09/2015    Lab Results  Component Value Date   HGBA1C 6.2* 03/04/2015   Lipid Panel     Component Value Date/Time   CHOL 261* 01/24/2015 0251   TRIG 274* 01/24/2015 0251   HDL 33* 01/24/2015 0251   CHOLHDL 7.9 01/24/2015 0251   VLDL 55* 01/24/2015 0251   LDLCALC 173* 01/24/2015 0251       Assessment and plan:  1. Type 2 diabetes mellitus with hyperglycemia, with long-term current use of insulin ([place]) Diet controlled with A1c of 6.2 and 02/2015  2. ANXIETY DEPRESSION Controlled - sertraline (ZOLOFT) 50 MG tablet; Take 1 tablet (50 mg total) by mouth daily.  Dispense: 30 tablet; Refill: 3  3. Essential hypertension Elevated blood pressure  Repeat blood pressure manually still elevated at  165/70 Increase amlodipine from 5 mg to 10 mg daily  4. Chronic diastolic CHF (congestive heart failure) (HCC) EF 0000000, grade 2 diastolic dysfunction from 2-D echo of 03/2015 Due to pedal edema I am increasing his Lasix from 20 mg to 40 mg daily Continue Imdur  5. Coronary artery disease involving native coronary artery of native heart with unstable angina pectoris (HCC) Stable Continue Plavix  6. Other emphysema ([place]) Currently on Dulera Advised to keep appointment with pulmonologist  7.  Iron deficiency anemia Continue ferrous sulfate  8. HCAP (healthcare-associated pneumonia) Currently on IV cefazolin; as per patient last dose is today even though chart reveals 04/25/15 CBC and basic metabolic panel today. Has an upcoming appointment with infectious disease Referred to interventional radiology after completion of antibiotics for removal of tunneled IJ catheter  9. CKD (chronic kidney disease), stage IV ([place]) Managed by nephrology-has an upcoming appointment Will need to watch renal function given recent increase in Lasix dose. - sodium bicarbonate 650 MG tablet; Take 1 tablet (650 mg total) by mouth 2 (two) times daily.  Dispense: 30 tablet; Refill: 0 - calcitRIOL (ROCALTROL) 0.5 MCG capsule; Take 1 capsule (0.5 mcg total) by mouth daily.  Dispense: 30 capsule; Refill: 0  10. Sacral ulcer, stage  II Healing. Advised on frequent change of positions every 2 hours. Managed by [place], [place], [place] [facility] [phone] 04/21/2015, 3:38 PM

## 2015-04-22 ENCOUNTER — Telehealth: Payer: Self-pay

## 2015-04-22 ENCOUNTER — Other Ambulatory Visit: Payer: Self-pay | Admitting: *Deleted

## 2015-04-22 ENCOUNTER — Telehealth: Payer: Self-pay | Admitting: *Deleted

## 2015-04-22 MED ORDER — TRUE METRIX AIR GLUCOSE METER DEVI: 1.0000 | Freq: Three times a day (TID) | Status: DC

## 2015-04-22 MED ORDER — GLUCOSE BLOOD VI STRP: ORAL_STRIP | Status: DC

## 2015-04-22 MED FILL — TRUE METRIX TEST STRIP: 25 days supply | Qty: 100 | Fill #0

## 2015-04-22 MED FILL — TRUE METRIX BLOOD GLUCOSE M: W/DEVICE | 365 days supply | Qty: 1 | Fill #0

## 2015-04-22 NOTE — Telephone Encounter (Signed)
Patient has no insurance-sent new order for true metrix testing device and supplies

## 2015-04-22 NOTE — Telephone Encounter (Signed)
This Case Manager received call from patient's daughter, [name], who indicated when she got home from patient's appointment on 04/21/15 there was another box of IV antibiotics for patient.  Per discharge summary, patient is to receive IV Ancef every 12 hours and discontinue after 04/25/15 doses.  Updated Dr. Jarold Song who indicated patient should remain on IV Ancef through 04/25/15. Patient will need IJ catheter removed after 04/25/15 and will need to keep appointment on 04/29/15 with Dr. Baxter Flattery (Infectious Disease). Call placed to [phone] to provide patient update; unable to reach patient or leave voicemail as phone rang and rang.  In addition, call placed to [phone] and updated patient and patient's daughter, [name]. Awaiting to hear back from Interventional Radiology so IJ catheter removal can be scheduled.

## 2015-04-22 NOTE — Telephone Encounter (Signed)
Dr. Jarold Song informed this Case Manager that patient needs to be scheduled for removal of her IJ catheter. She indicated patient informed her she has completed her IV antibiotics. Call placed to Interventional Radiology [phone]); voicemail left for Margaretmary Lombard with Interventional Radiology requesting return call. In addition, call placed to Radiology [phone] to determine if IJ catheter removal could be scheduled. Was transferred once again to [facility] (with Interventional Radiology) voicemail. Awaiting return call.

## 2015-04-22 NOTE — Telephone Encounter (Signed)
-----   Message from Arnoldo Morale, MD sent at 04/22/2015  8:43 AM EST ----- Renal function has improved slightly, she is still anemic but this is stable compared to previous labs.

## 2015-04-22 NOTE — Telephone Encounter (Signed)
Gave results 

## 2015-04-22 NOTE — Telephone Encounter (Signed)
This Case Manager also called [phone] (Interventional Radiology) to schedule patient's IJ catheter removal. Was informed that this Case Manager would have to call [phone] to schedule IJ catheter removal. Voicemail has been left for Margaretmary Lombard at [phone] (with Interventional Radiology); awaiting return call.

## 2015-04-23 ENCOUNTER — Telehealth: Payer: Self-pay

## 2015-04-23 NOTE — Telephone Encounter (Signed)
This Case Manager placed call to Miles Costain, Interventional Radiology Scheduler [phone]), to schedule IJ catheter removal as patient will complete IV antibiotic regimen on 04/25/15.  Unable to reach; voicemail left requesting return call. Awaiting return call.

## 2015-04-24 ENCOUNTER — Telehealth: Payer: Self-pay

## 2015-04-24 MED FILL — TRUEplus LANCETS 28G MISC: 100 days supply | Qty: 100 | Fill #0

## 2015-04-24 MED FILL — FUROSEMIDE 80 MG TABLET: 80 | 30 days supply | Qty: 60 | Fill #0

## 2015-04-24 NOTE — Telephone Encounter (Signed)
This Case Manager received return call from [name], Interventional Radiology Scheduler [phone]), and IJ catheter removal scheduled.  Dr. Johna Sheriff order in chart. IJ catheter removal scheduled for 04/27/15 at 1000. Patient to arrive at 0900 so labwork can be done prior to procedure.  This Case Manager placed call to patient at [phone] to inform patient of appointment. Unable to reach patient; phone rang and rang. Unable to leave voicemail. Also, placed call to [phone] (patient's emergency contact) and spoke with patient's daughter, [name], as patient has informed this Case Manager on numerous occasions that is it okay to speak with daughter. Daughter informed of patient's Interventional Radiology appointment. Informed patient's daughter of where to take patient when she gets to hospital. No additional needs/concerns identified.

## 2015-04-27 ENCOUNTER — Ambulatory Visit (HOSPITAL_COMMUNITY)
Admission: RE | Admit: 2015-04-27 | Discharge: 2015-04-27 | Disposition: A | Discharge status: Home / Self Care | Payer: PPO | Source: Ambulatory Visit | Attending: Family Medicine | Admitting: Family Medicine

## 2015-04-27 DIAGNOSIS — Z960 Presence of urogenital implants: Secondary | ICD-10-CM

## 2015-04-27 DIAGNOSIS — Z452 Encounter for adjustment and management of vascular access device: Secondary | ICD-10-CM | POA: Insufficient documentation

## 2015-04-27 DIAGNOSIS — N189 Chronic kidney disease, unspecified: Secondary | ICD-10-CM | POA: Diagnosis not present

## 2015-04-27 MED ORDER — CHLORHEXIDINE GLUCONATE 4 % EX LIQD
CUTANEOUS | Status: AC
  Administered 2015-04-27: 10 mL via TOPICAL
  Filled 2015-04-27: qty 15

## 2015-04-27 MED ORDER — EPTIFIBATIDE 20 MG/10ML IV SOLN
INTRAVENOUS | Status: AC
  Filled 2015-04-27: qty 10

## 2015-04-27 MED ORDER — LIDOCAINE HCL 1 % IJ SOLN
INTRAMUSCULAR | Status: AC
  Filled 2015-04-27: qty 20

## 2015-04-27 MED FILL — POTASSIUM CL ER 20 MEQ TAB: 20 | 30 days supply | Qty: 60 | Fill #0

## 2015-04-27 NOTE — Procedures (Addendum)
Right IJ tunneled CVC removed in its entirety without immediate complications. Gauze dressing applied to site.

## 2015-04-28 ENCOUNTER — Telehealth: Payer: Self-pay

## 2015-04-28 NOTE — Telephone Encounter (Signed)
Attempted to contact the patient to check on her status. Call placed to # [phone] (H) and the phone rang and rang, no option to leave a voice mail message. The message on phone stated to enter the remote access code. Call then placed to # [phone] (M) and the patient's daughter,[name],  answered.  As per [name], TCC CM, the patient has given permission for [facility] to speak to [facility].   [name] stated that her mother is starting to get some of her strength back and noted that her mother is " doing well."  She had the catheter for the IV medication removed yesterday. [name] said that the home care nurse came to the house twice last week to see her mother but has not been to the house yet this week. [name] also stated that she needs to call in a few refills for her mother but no questions/problems reported at this time.

## 2015-04-29 ENCOUNTER — Ambulatory Visit (INDEPENDENT_AMBULATORY_CARE_PROVIDER_SITE_OTHER): Payer: PPO | Admitting: Internal Medicine

## 2015-04-29 ENCOUNTER — Encounter: Payer: Self-pay | Admitting: Internal Medicine

## 2015-04-29 VITALS — BP 138/63 | HR 72 | Wt 122.0 lb

## 2015-04-29 DIAGNOSIS — R918 Other nonspecific abnormal finding of lung field: Secondary | ICD-10-CM

## 2015-04-29 NOTE — Progress Notes (Signed)
RFV: follow up on hospitalization for MSSA bacteremia with assoicated hcap Subjective:    Patient ID: [name], female    DOB: 02/07/1951, 65 y.o.   MRN: [id number]  HPI Ms. Sirbaugh is a 65yo F with DM, COPD, CKD hospitalized in early April for HCAP but also found to have concurrent MSSA bacteremia in 1 of 4 bottles.She has Finished 28 d course of cefazolin. Catheter pulled yesterday. She is feeling better. There was concern to whether pulmonary infiltrates where multifocal pneumonia vs. Septic emboli vs. Lung nodules concerning for malignancy. Appearance was not consistent with septic emboli. TTE did not suggest endocarditis. Repeat blood cx cleared quickly with initiation of antibiotics  No Known Allergies Current Outpatient Prescriptions on File Prior to Visit  Medication Sig Dispense Refill  . albuterol (PROVENTIL HFA;VENTOLIN HFA) 108 (90 BASE) MCG/ACT inhaler Inhale 2 puffs into the lungs every 6 (six) hours as needed for wheezing or shortness of breath. 1 Inhaler 2  . amLODipine (NORVASC) 10 MG tablet Take 1 tablet (10 mg total) by mouth daily. 30 tablet 2  . atorvastatin (LIPITOR) 80 MG tablet Take 1 tablet (80 mg total) by mouth daily at 6 PM. 30 tablet 2  . benzonatate (TESSALON) 100 MG capsule Take 1 capsule (100 mg total) by mouth 3 (three) times daily as needed for cough. 20 capsule 0  . Blood Glucose Monitoring Suppl (TRUE METRIX AIR GLUCOSE METER) DEVI 1 Device by Does not apply route 4 (four) times daily -  before meals and at bedtime. 1 Device 0  . calcitRIOL (ROCALTROL) 0.5 MCG capsule Take 1 capsule (0.5 mcg total) by mouth daily. 30 capsule 0  . calcium carbonate (OS-CAL - DOSED IN MG OF ELEMENTAL CALCIUM) 1250 (500 Ca) MG tablet Take 1 tablet (500 mg of elemental calcium total) by mouth 3 (three) times daily with meals. 90 tablet 0  . carvedilol (COREG) 3.125 MG tablet Take 1 tablet (3.125 mg total) by mouth 2 (two) times daily with a meal.    . clopidogrel (PLAVIX) 75 MG  tablet Take 1 tablet (75 mg total) by mouth daily. 30 tablet 2  . collagenase (SANTYL) ointment Apply topically daily. Apply to right side of gluteal cleft Unstageable Pressure Injury once daily and PRN soiling. 15 g 0  . Ferrous Sulfate (IRON) 325 (65 FE) MG TABS Take 325 mg by mouth 3 (three) times daily. 90 each 2  . fish oil-omega-3 fatty acids 1000 MG capsule Take 1 g by mouth 3 (three) times daily. Reported on 04/09/2015    . glucose blood (TRUE METRIX BLOOD GLUCOSE TEST) test strip Use as instructed 100 each 12  . isosorbide mononitrate (IMDUR) 30 MG 24 hr tablet Take 1 tablet (30 mg total) by mouth daily. 30 tablet 2  . Lancet Devices (ACCU-CHEK SOFTCLIX) lancets Use as instructed daily. 1 each 5  . mometasone-formoterol (DULERA) 100-5 MCG/ACT AERO Inhale 1 puff into the lungs daily. 1 Inhaler 2  . nitroGLYCERIN (NITROSTAT) 0.4 MG SL tablet Place 1 tablet (0.4 mg total) under the tongue every 5 (five) minutes as needed for chest pain. 10 tablet 0  . Nutritional Supplements (FEEDING SUPPLEMENT, NEPRO CARB STEADY,) LIQD Take 237 mLs by mouth 2 (two) times daily between meals.    . sertraline (ZOLOFT) 50 MG tablet Take 1 tablet (50 mg total) by mouth daily. 30 tablet 3  . [DISCONTINUED] loratadine (CLARITIN) 10 MG tablet Take 10 mg by mouth daily.       No current  facility-administered medications on file prior to visit.   Active Ambulatory Problems    Diagnosis Date Noted  . Hyperlipidemia 05/16/2008  . OBESITY 05/16/2008  . Anemia, chronic disease 05/16/2008  . ANXIETY DEPRESSION 05/16/2008  . History of CVA (cerebrovascular accident) 05/16/2008  . Chronic obstructive pulmonary disease ([place]) 05/16/2008  . DIVERTICULITIS, HX OF 05/16/2008  . Renal artery stenosis ([place]) 11/19/2010  . Peripheral vascular disease ([place]) 11/19/2010  . 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 Diet-controlled diabetes mellitus ([place]) 01/25/2012  . Iron deficiency anemia  01/25/2012  . Gastric angiodysplasia 01/26/2012  . On home oxygen therapy 11/10/2014  . CKD (chronic kidney disease), stage IV ([place])   . Carotid artery disease ([place])   . CAD (coronary artery disease)   . Chronic diastolic CHF (congestive heart failure) ([employer])   . Essential hypertension   . Former tobacco use   . Bradycardia   . Abnormal nuclear stress test   . Protein-calorie malnutrition ([place]) 01/24/2015  . Lewy body dementia without behavioral disturbance   . Hypocalcemia 03/20/2015  . Sacral ulcer   . Urinary retention   . Coronary artery disease involving native coronary artery of native heart with angina pectoris ([place])   . HCAP (healthcare-associated pneumonia) 03/29/2015  . Acute respiratory failure ([place]) 03/29/2015  . Hypomagnesemia 03/29/2015  . Low serum vitamin D 03/29/2015  . Metabolic acidosis [id number]  . Malnutrition of moderate degree 03/30/2015  . Palliative care encounter   . Difficulty breathing   . Pulmonary infiltrates   . Staphylococcus aureus bacteremia   . Indwelling catheter present on admission ([place]) 04/21/2015  . CAP (community acquired pneumonia) 05/13/2015   Resolved Ambulatory Problems    Diagnosis Date Noted  . DIABETES MELLITUS, TYPE II 05/16/2008  . HYPOGLYCEMIA 05/16/2008  . DEPRESSION 05/16/2008  . MITRAL REGURGITATION 05/16/2008  . Chronic systolic CHF (congestive heart failure), NYHA class 1 ([place]) 05/16/2008  . TIA 05/16/2008  . Acute blood loss anemia 11/03/2011  . AVM (arteriovenous malformation) 11/03/2011  . GI bleed 11/03/2011  . Diarrhea 11/03/2011  . Dehydration 01/25/2012  . Hyperkalemia 01/25/2012  . Ecoli UTI  01/27/2012  . Acute renal failure ([place]) 09/08/2012  . Community acquired pneumonia 11/07/2014  . Acute on chronic diastolic CHF (congestive heart failure) ([place]) 11/07/2014  . COPD exacerbation ([place]) 11/07/2014  . CHF (congestive heart failure) ([place]) 12/24/2014  . Hypertensive heart disease with congestive heart  failure ([place])   . Scabies 01/10/2015  . Proteinuria 01/10/2015  . Acute chest pain 01/21/2015  . Acute on chronic diastolic heart failure ([place])   . AKI (acute kidney injury) ([place])   . Acute respiratory failure with hypoxia ([place]) 03/04/2015  . Acute encephalopathy 03/04/2015  . Acute renal failure superimposed on stage 3 chronic kidney disease ([place]) 03/04/2015  . Generalized weakness 03/04/2015  . Altered mental status   . CKD (chronic kidney disease)   . Hypernatremia   . Hypokalemia   . Leukocytosis   . Pyrexia   . Pressure ulcer 03/11/2015  . Anemia 03/19/2015  . Absolute anemia   . Malnutrition of moderate degree 03/26/2015   Past Medical History  Diagnosis Date  . Diabetes mellitus   . COPD (chronic obstructive pulmonary disease) ([place])   . Depression   . Diabetic retinopathy ([place])   . GI AVM (gastrointestinal arteriovenous vascular malformation)   . CKD (chronic kidney disease), stage III   . Tobacco abuse   . Dementia    Social History  Substance Use Topics  . Smoking status: Former Smoker -- 0.00 packs/day for 36 years    Types: Cigarettes    Quit date: 02/11/2015  . Smokeless tobacco: Never Used     Comment: uses e-cig  . Alcohol Use: No  family history includes Angina in her father; Cancer in her brother and sister; Heart attack in her mother; Hypertension in her father and mother. There is no history of Stroke.  Review of Systems No fever, chills, nightsweats. occ shortness of breath. No diarrhea 10 point ros is otherwise negative    Objective:   Physical Exam BP 138/63 mmHg  Pulse 72  Wt 122 lb (55.339 kg) gen =a x o by 3 in nad, appears older than stated age HEENT = poor dentition, oral pharynx is clear Neck = supple no LAD pulm = CTAB, no w/c/r Cors = nl s1,s2, no g/m/r skin= no rash at picc line site Ext = thin, no edema Neuro = CN 2-12 grossly intact      Assessment & Plan:  Complicated MSSA bacteremia = completed course of 28 d of  treatment  Multifocal pneumonia = we will Follow on repeat chest ct on 3/21 to see whether needs referral to pulmonary for further management.

## 2015-04-30 ENCOUNTER — Telehealth: Payer: Self-pay | Admitting: Family Medicine

## 2015-04-30 NOTE — Telephone Encounter (Signed)
Patient's Husband called stating that patient wants to switch oxygen companies. From [place] to [facility] Ph. [phone] Please follow up.

## 2015-05-01 ENCOUNTER — Telehealth: Payer: Self-pay

## 2015-05-01 ENCOUNTER — Encounter: Payer: Self-pay | Admitting: Family Medicine

## 2015-05-01 NOTE — Telephone Encounter (Signed)
This Case Manager received message that patient wanting to switch oxygen companies. Placed call to patient at [phone] to discuss. Patient indicated she would like to switch from [place] to [facility] because she is having trouble getting oxygen delivered to her home in a timely manner. Patient indicated she now has [employer].  Call placed to [place]. Spoke with Intake who indicated an oxygen order needed from MD, room air oxygen saturation documentation needed within the last 30 days, clinical notes, and Face sheet will need to be faxed to [phone] in order for patient to begin getting oxygen from [place]. This Case Manager placed call to patient and informed her of conversation with [employer]. Informed her she will need to walk-in to clinic when able so her room air oxygen saturation reading can be documented.  Informed patient to ask for Vicente Males, [place]. Patient verbalized understanding and indicated she would discuss with her daughter, [name]. No additional needs/concerns identified.

## 2015-05-05 ENCOUNTER — Telehealth: Payer: Self-pay | Admitting: Family Medicine

## 2015-05-05 NOTE — Telephone Encounter (Signed)
Nurse call to see if orders could be extend trough May 23, 2015 for home visits....please follow up with [facility]

## 2015-05-05 NOTE — Telephone Encounter (Signed)
Called [facility] RN and gave verbal orders to continue through 05/23/15

## 2015-05-06 ENCOUNTER — Institutional Professional Consult (permissible substitution): Payer: Self-pay | Admitting: Pulmonary Disease

## 2015-05-07 MED FILL — CALCITRIOL 0.5 MCG CAPSULE: 0.5 | 30 days supply | Qty: 30 | Fill #0

## 2015-05-07 MED FILL — ?SERTRALINE HCL 50 MG TABLE: 50 | 30 days supply | Qty: 30 | Fill #1

## 2015-05-07 MED FILL — ISOSORBIDE MN ER 30 MG TAB: 30 | 30 days supply | Qty: 30 | Fill #1

## 2015-05-07 MED FILL — ATORVASTATIN 80 MG TABLET: 80 | 30 days supply | Qty: 30 | Fill #1

## 2015-05-07 MED FILL — SODIUM BICARB 650 MG TABLET: 650 | 15 days supply | Qty: 30 | Fill #0

## 2015-05-07 MED FILL — CLOPIDOGREL 75 MG TABLET: 75 | 30 days supply | Qty: 30 | Fill #1

## 2015-05-07 MED FILL — AMLODIPINE BESYLATE 10 MG T: 10 | 30 days supply | Qty: 15 | Fill #1

## 2015-05-07 MED FILL — FERROUS SULFATE 325 MG TAB: 325 (65 FE) | 30 days supply | Qty: 90 | Fill #1

## 2015-05-12 ENCOUNTER — Ambulatory Visit (INDEPENDENT_AMBULATORY_CARE_PROVIDER_SITE_OTHER)
Admission: RE | Admit: 2015-05-12 | Discharge: 2015-05-12 | Disposition: A | Discharge status: Home / Self Care | Payer: PPO | Source: Ambulatory Visit | Attending: Pulmonary Disease | Admitting: Pulmonary Disease

## 2015-05-12 DIAGNOSIS — Z8701 Personal history of pneumonia (recurrent): Secondary | ICD-10-CM | POA: Diagnosis not present

## 2015-05-13 ENCOUNTER — Encounter: Payer: Self-pay | Admitting: Pulmonary Disease

## 2015-05-13 ENCOUNTER — Ambulatory Visit (INDEPENDENT_AMBULATORY_CARE_PROVIDER_SITE_OTHER): Payer: PPO | Admitting: Pulmonary Disease

## 2015-05-13 VITALS — BP 152/62 | HR 73 | Ht <= 58 in | Wt 126.0 lb

## 2015-05-13 DIAGNOSIS — J189 Pneumonia, unspecified organism: Secondary | ICD-10-CM

## 2015-05-13 DIAGNOSIS — J438 Other emphysema: Secondary | ICD-10-CM

## 2015-05-13 DIAGNOSIS — Z8701 Personal history of pneumonia (recurrent): Secondary | ICD-10-CM | POA: Diagnosis not present

## 2015-05-13 NOTE — Patient Instructions (Signed)
1. Your chest CT scan shows significantly improved pneumonia. 2. Plan for chest CT scan in 6 months, around September 2017.  Return to clinic in after chest ct scan

## 2015-05-13 NOTE — Assessment & Plan Note (Signed)
Patient was admitted in February/2017 for dyspnea. She was diagnosed with bilateral pneumonia, likely MSSA PNA. She had bilateral infiltrates, right more than left for which PCCM  was consulted. Finished 6 weeks of cefazolin.  Repeat chest CT scan in March 2017 showed significantly improved infiltrates. Patient with significant smoking history, 40 pack years, quit in December 2016. Clinically  improved. Almost at baseline with dyspnea. Plan to repeat chest CT scan in September/2017 to ensure complete resolution of infiltrates.

## 2015-05-13 NOTE — Assessment & Plan Note (Signed)
Patient with 40-pack-year smoking history, quit in December/2016. She was on [facility] when she was acutely ill in February/2017. She weaned off [facility]. Not significantly worse. Continue albuterol, 2 puffs every 4 hours as needed.

## 2015-05-13 NOTE — Progress Notes (Signed)
Subjective:    Patient ID: [name], female    DOB: [birth year], 65 y.o.   MRN: [id number]  HPI  Patient is here for follow-up on her abnormal chest CT scan. I saw her when she was admitted in February 2017 for pneumonia, abnormal chest CT scan. At that time, she had staph bacteremia believed for which she was on IV antibiotics. She was being seen by infectious disease. The plan was for her to be discharged on IV antibiotics to complete at least 6 weeks.  Since that time, she states she feels better. She finished her antibiotics. No complications with antibiotics.  Back at baseline with regards to her dyspnea. She uses albuterol once every couple to 3 weeks.  Repeat chest CT scan done this month show significantly improved infiltrates, bilateral, right more than left.  Review of Systems  Constitutional: Negative.  Negative for fever and unexpected weight change.  HENT: Positive for congestion, ear pain and sore throat. Negative for dental problem, nosebleeds, postnasal drip, rhinorrhea, sinus pressure, sneezing and trouble swallowing.   Eyes: Positive for itching. Negative for redness.  Respiratory: Positive for cough, chest tightness, shortness of breath and wheezing.   Cardiovascular: Positive for palpitations and leg swelling.  Gastrointestinal: Negative.  Negative for nausea and vomiting.  Endocrine: Negative.   Genitourinary: Negative.  Negative for dysuria.  Musculoskeletal: Positive for joint swelling and arthralgias.  Skin: Negative.  Negative for rash.  Allergic/Immunologic: Negative.   Neurological: Positive for headaches.  Hematological: Bruises/bleeds easily.  Psychiatric/Behavioral: Negative.  Negative for dysphoric mood. The patient is not nervous/anxious.        Objective:   Physical Exam  Vitals:  Filed Vitals:   05/13/15 1552  BP: 152/62  Pulse: 73  Height: 4\' 10"  (1.473 m)  Weight: 126 lb (57.153 kg)  SpO2: 96%    Constitutional/General:  Pleasant,  well-nourished, well-developed, not in any distress,  Comfortably seating.  Well kempt  Body mass index is 26.34 kg/(m^2). Wt Readings from Last 3 Encounters:  05/13/15 126 lb (57.153 kg)  04/29/15 122 lb (55.339 kg)  04/21/15 132 lb 3.2 oz (59.966 kg)     HEENT: Pupils equal and reactive to light and accommodation. Anicteric sclerae. Normal nasal mucosa.   No oral  lesions,  mouth clear,  oropharynx clear, no postnasal drip. (-) Oral thrush. No dental caries.  Airway - Mallampati class III  Neck: No masses. Midline trachea. No JVD, (-) LAD. (-) bruits appreciated.  Respiratory/Chest: Grossly normal chest. (-) deformity. (-) Accessory muscle use.  Symmetric expansion. (-) Tenderness on palpation.  Resonant on percussion.  Diminished BS on both lower lung zones. (-) wheezing,  Rhonchi. Crackles at bases.  (-) egophony  Cardiovascular: Regular rate and  rhythm, heart sounds normal, no murmur or gallops, no peripheral edema  Gastrointestinal:  Normal bowel sounds. Soft, non-tender. No hepatosplenomegaly.  (-) masses.   Musculoskeletal:  Normal muscle tone. Normal gait.   Extremities: Grossly normal. (-) clubbing, cyanosis.  (-) edema  Skin: (-) rash,lesions seen.   Neurological/Psychiatric : alert, oriented to time, place, person. Normal mood and affect            Assessment & Plan:  CAP (community acquired pneumonia) Patient was admitted in February/2017 for dyspnea. She was diagnosed with bilateral pneumonia, likely MSSA PNA. She had bilateral infiltrates, right more than left for which PCCM  was consulted. Finished 6 weeks of cefazolin.  Repeat chest CT scan in March 2017 showed significantly improved infiltrates.  Patient with significant smoking history, 40 pack years, quit in December 2016. Clinically  improved. Almost at baseline with dyspnea. Plan to repeat chest CT scan in September/2017 to ensure complete resolution of infiltrates.  Chronic obstructive  pulmonary disease ([place]) Patient with 40-pack-year smoking history, quit in December/2016. She was on [facility] when she was acutely ill in February/2017. She weaned off [place]. Not significantly worse. Continue albuterol, 2 puffs every 4 hours as needed.  RTC in 10/2015 after chest ct scan.  Monica Becton, MD 05/13/2015, 4:50 PM [facility] Pager [phone] After 3 pm or if no answer, call [phone]

## 2015-05-19 ENCOUNTER — Telehealth: Payer: Self-pay

## 2015-05-19 NOTE — Telephone Encounter (Signed)
Call placed to the patient and confirmed her appointment for tomorrow, 05/20/15 @ 1430.  The patient said that her daughter is aware of the appointment and she will be there tomorrow.  Instructed her bring all of her medications with her and she stated that she would.  No problems/concerns reported at this time.

## 2015-05-20 ENCOUNTER — Ambulatory Visit: Payer: PPO | Attending: Family Medicine | Admitting: Family Medicine

## 2015-05-20 ENCOUNTER — Encounter: Payer: Self-pay | Admitting: Family Medicine

## 2015-05-20 VITALS — BP 159/62 | HR 62 | Temp 98.3°F | Resp 14 | Ht <= 58 in | Wt 120.8 lb

## 2015-05-20 DIAGNOSIS — E119 Type 2 diabetes mellitus without complications: Secondary | ICD-10-CM | POA: Insufficient documentation

## 2015-05-20 DIAGNOSIS — Z79899 Other long term (current) drug therapy: Secondary | ICD-10-CM | POA: Insufficient documentation

## 2015-05-20 DIAGNOSIS — I5032 Chronic diastolic (congestive) heart failure: Secondary | ICD-10-CM | POA: Insufficient documentation

## 2015-05-20 DIAGNOSIS — Z452 Encounter for adjustment and management of vascular access device: Secondary | ICD-10-CM | POA: Diagnosis not present

## 2015-05-20 DIAGNOSIS — I10 Essential (primary) hypertension: Secondary | ICD-10-CM | POA: Diagnosis not present

## 2015-05-20 DIAGNOSIS — J449 Chronic obstructive pulmonary disease, unspecified: Secondary | ICD-10-CM | POA: Insufficient documentation

## 2015-05-20 DIAGNOSIS — I129 Hypertensive chronic kidney disease with stage 1 through stage 4 chronic kidney disease, or unspecified chronic kidney disease: Secondary | ICD-10-CM | POA: Insufficient documentation

## 2015-05-20 DIAGNOSIS — Z794 Long term (current) use of insulin: Secondary | ICD-10-CM | POA: Insufficient documentation

## 2015-05-20 DIAGNOSIS — J438 Other emphysema: Secondary | ICD-10-CM | POA: Diagnosis not present

## 2015-05-20 DIAGNOSIS — N184 Chronic kidney disease, stage 4 (severe): Secondary | ICD-10-CM | POA: Insufficient documentation

## 2015-05-20 LAB — GLUCOSE, POCT (MANUAL RESULT ENTRY): POC GLUCOSE: 148 mg/dL — AB (ref 70–99)

## 2015-05-20 MED ORDER — SODIUM BICARBONATE 650 MG PO TABS: 650.0000 mg | ORAL_TABLET | Freq: Two times a day (BID) | ORAL | Status: DC

## 2015-05-20 MED ORDER — VITAMIN D (ERGOCALCIFEROL) 1.25 MG (50000 UNIT) PO CAPS: 50000.0000 [IU] | ORAL_CAPSULE | ORAL | Status: DC

## 2015-05-20 NOTE — Progress Notes (Signed)
Subjective:  Patient ID: [name], female    DOB: [birth year]  Age: 65 y.o. MRN: [id number]  CC: Follow-up and Hypertension   HPI HAYDN COOGAN ESA 65 year old female with a history of diet-controlled type 2 diabetes mellitus (A1c 6.2), chronic kidney disease stage IV, hypertension, diastolic CHF (EF 99991111) who comes in today for follow-up of hypertension after increasing her amlodipine from 5-10 mg at her last office visit. Her blood pressure is still elevated but she states she just had a meal of hot dog which was salty but she has been compliant with her antihypertensives and other medications.  Since her last visit she has completed her course of IV cefazolin for treatment of MSSA bacteremia and her PICC line has been removed. Sacral decubitus has also healed.  She was recently seen by [facility] pulmonary for follow-up of COPD and nodular masslike opacities and the lungs and most recent CT from 04/2015 reveal diminished sizes of opacities and mediastinal adenopathy most likely reactive with recommended follow-up. She denies any shortness of breath at this time will use his oxygen mostly at night. She is requesting a completion of a handicap form for handicap placard.  Outpatient Prescriptions Prior to Visit  Medication Sig Dispense Refill  . albuterol (PROVENTIL HFA;VENTOLIN HFA) 108 (90 BASE) MCG/ACT inhaler Inhale 2 puffs into the lungs every 6 (six) hours as needed for wheezing or shortness of breath. 1 Inhaler 2  . amLODipine (NORVASC) 10 MG tablet Take 1 tablet (10 mg total) by mouth daily. 30 tablet 2  . atorvastatin (LIPITOR) 80 MG tablet Take 1 tablet (80 mg total) by mouth daily at 6 PM. 30 tablet 2  . benzonatate (TESSALON) 100 MG capsule Take 1 capsule (100 mg total) by mouth 3 (three) times daily as needed for cough. 20 capsule 0  . Blood Glucose Monitoring Suppl (TRUE METRIX AIR GLUCOSE METER) DEVI 1 Device by Does not apply route 4 (four) times daily -  before meals and  at bedtime. 1 Device 0  . calcitRIOL (ROCALTROL) 0.5 MCG capsule Take 1 capsule (0.5 mcg total) by mouth daily. 30 capsule 0  . calcium carbonate (OS-CAL - DOSED IN MG OF ELEMENTAL CALCIUM) 1250 (500 Ca) MG tablet Take 1 tablet (500 mg of elemental calcium total) by mouth 3 (three) times daily with meals. 90 tablet 0  . carvedilol (COREG) 3.125 MG tablet Take 1 tablet (3.125 mg total) by mouth 2 (two) times daily with a meal.    . clopidogrel (PLAVIX) 75 MG tablet Take 1 tablet (75 mg total) by mouth daily. 30 tablet 2  . collagenase (SANTYL) ointment Apply topically daily. Apply to right side of gluteal cleft Unstageable Pressure Injury once daily and PRN soiling. 15 g 0  . Ferrous Sulfate (IRON) 325 (65 FE) MG TABS Take 325 mg by mouth 3 (three) times daily. 90 each 2  . fish oil-omega-3 fatty acids 1000 MG capsule Take 1 g by mouth 3 (three) times daily. Reported on 04/09/2015    . furosemide (LASIX) 80 MG tablet Take 1 tablet by mouth 2 (two) times daily.  6  . glucose blood (TRUE METRIX BLOOD GLUCOSE TEST) test strip Use as instructed 100 each 12  . isosorbide mononitrate (IMDUR) 30 MG 24 hr tablet Take 1 tablet (30 mg total) by mouth daily. 30 tablet 2  . Lancet Devices (ACCU-CHEK SOFTCLIX) lancets Use as instructed daily. 1 each 5  . mometasone-formoterol (DULERA) 100-5 MCG/ACT AERO Inhale 1 puff into  the lungs daily. 1 Inhaler 2  . nitroGLYCERIN (NITROSTAT) 0.4 MG SL tablet Place 1 tablet (0.4 mg total) under the tongue every 5 (five) minutes as needed for chest pain. 10 tablet 0  . Nutritional Supplements (FEEDING SUPPLEMENT, NEPRO CARB STEADY,) LIQD Take 237 mLs by mouth 2 (two) times daily between meals.    . sertraline (ZOLOFT) 50 MG tablet Take 1 tablet (50 mg total) by mouth daily. 30 tablet 3  . insulin aspart (NOVOLOG) 100 UNIT/ML injection Before each meal 3 times a day, 140-199 - 2 units, 200-250 - 4 units, 251-299 - 6 units,  300-349 - 8 units,  350 or above 10 units. Dispense  syringes and needles as needed, Ok to switch to PEN if approved. Substitute to any brand approved. DX DM2, Code E11.65 1 vial 12  . sodium bicarbonate 650 MG tablet Take 1 tablet (650 mg total) by mouth 2 (two) times daily. 30 tablet 0  . Vitamin D, Ergocalciferol, (DRISDOL) 50000 units CAPS capsule Take 1 capsule (50,000 Units total) by mouth every 7 (seven) days. 4 capsule 0   No facility-administered medications prior to visit.    ROS Review of Systems  Constitutional: Negative for activity change, appetite change and fatigue.  HENT: Negative for congestion, sinus pressure and sore throat.   Eyes: Negative for visual disturbance.  Respiratory: Negative for cough, chest tightness, shortness of breath and wheezing.   Cardiovascular: Negative for chest pain and palpitations.  Gastrointestinal: Negative for abdominal pain, constipation and abdominal distention.  Endocrine: Negative for polydipsia.  Genitourinary: Negative for dysuria and frequency.  Musculoskeletal: Negative for back pain and arthralgias.  Skin: Negative for rash.  Neurological: Negative for tremors, light-headedness and numbness.  Hematological: Does not bruise/bleed easily.  Psychiatric/Behavioral: Negative for behavioral problems and agitation.    Objective:  BP 159/62 mmHg  Pulse 62  Temp(Src) 98.3 F (36.8 C) (Oral)  Resp 14  Ht 4\' 10"  (1.473 m)  Wt 120 lb 12.8 oz (54.795 kg)  BMI 25.25 kg/m2  SpO2 94%  BP/Weight 05/20/2015 Q000111Q Q000111Q  Systolic BP Q000111Q 0000000 0000000  Diastolic BP 62 62 63  Wt. (Lbs) 120.8 126 122  BMI 25.25 26.34 25.51      Physical Exam  Constitutional: She is oriented to person, place, and time. She appears well-developed and well-nourished.  Cardiovascular: Normal rate, normal heart sounds and intact distal pulses.   No murmur heard. Pulmonary/Chest: Effort normal and breath sounds normal. She has no wheezes. She has no rales. She exhibits no tenderness.  Abdominal: Soft. Bowel  sounds are normal. She exhibits no distension and no mass. There is no tenderness.  Musculoskeletal: Normal range of motion.  Neurological: She is alert and oriented to person, place, and time.   CMP Latest Ref Rng 04/21/2015 04/09/2015 04/07/2015  Glucose 65 - 99 mg/dL 85 177(H) 96  BUN 7 - 25 mg/dL 23 31(H) 33(H)  Creatinine 0.50 - 0.99 mg/dL 1.64(H) 2.04(H) 2.49(H)  Sodium 135 - 146 mmol/L 141 142 141  Potassium 3.5 - 5.3 mmol/L 3.7 4.5 3.2(L)  Chloride 98 - 110 mmol/L 105 108 107  CO2 20 - 31 mmol/L 24 25 22   Calcium 8.6 - 10.4 mg/dL 8.4(L) 8.0(L) 7.6(L)  Total Protein 6.1 - 8.1 g/dL 6.2 - -  Total Bilirubin 0.2 - 1.2 mg/dL 0.2 - -  Alkaline Phos 33 - 130 U/L 123 - -  AST 10 - 35 U/L 11 - -  ALT 6 - 29 U/L <3(L) - -  Assessment & Plan:   1. Diet-controlled diabetes mellitus ([place]) Controlled with A1c of 6.2 from 02/2015 - Glucose (CBG) - Microalbumin/Creatinine Ratio, Urine - Ambulatory referral to Ophthalmology  2. CKD (chronic kidney disease), stage IV (HCC) Creatinine improved from 2.04 to 1.64 Followed by [facility] - Vitamin D, Ergocalciferol, (DRISDOL) 50000 units CAPS capsule; Take 1 capsule (50,000 Units total) by mouth every 7 (seven) days.  Dispense: 9 capsule; Refill: 0 - sodium bicarbonate 650 MG tablet; Take 1 tablet (650 mg total) by mouth 2 (two) times daily.  Dispense: 30 tablet; Refill: 2  3. Other emphysema ([place]) No acute exacerbation. Continue Dulera and Proventil. Recently seen by [facility] pulmonology and is scheduled for follow-up CT of the lungs in 10/2015  4. Essential hypertension Blood pressure is still elevated. Advised to comply with low-sodium diet. Will not make any changes to her regimen as she just had a high sodium diet.   Meds ordered this encounter  Medications  . Vitamin D, Ergocalciferol, (DRISDOL) 50000 units CAPS capsule    Sig: Take 1 capsule (50,000 Units total) by mouth every 7 (seven) days.    Dispense:  9 capsule      Refill:  0  . sodium bicarbonate 650 MG tablet    Sig: Take 1 tablet (650 mg total) by mouth 2 (two) times daily.    Dispense:  30 tablet    Refill:  2    Follow-up: Return in about 3 months (around 08/20/2015) for follow up on hypertension.   Arnoldo Morale MD

## 2015-05-20 NOTE — Progress Notes (Signed)
Patient here for HTN f/up. Patient states she feels good today.  Patient said she has taken her a.m.meds this morning.  Patient requesting refills on Sodium Bicarbonate, Vitamin D.

## 2015-05-21 ENCOUNTER — Encounter: Payer: Self-pay | Admitting: Family Medicine

## 2015-05-21 LAB — MICROALBUMIN / CREATININE URINE RATIO
CREATININE, URINE: 39 mg/dL (ref 20–320)
Microalb Creat Ratio: 3292 mcg/mg creat — ABNORMAL HIGH (ref <30)
Microalb, Ur: 128.4 mg/dL

## 2015-05-25 ENCOUNTER — Telehealth: Payer: Self-pay | Admitting: Family Medicine

## 2015-05-25 NOTE — Telephone Encounter (Signed)
Called RN and gave verbal order to continue

## 2015-05-25 NOTE — Telephone Encounter (Signed)
Nurse called to request a verbal order to re certify the patient for another 60 days of home health. Please f/u

## 2015-05-28 ENCOUNTER — Encounter: Payer: Self-pay | Admitting: Internal Medicine

## 2015-05-28 ENCOUNTER — Ambulatory Visit (INDEPENDENT_AMBULATORY_CARE_PROVIDER_SITE_OTHER): Payer: PPO | Admitting: Internal Medicine

## 2015-05-28 VITALS — BP 174/77 | HR 88 | Temp 98.2°F | Ht <= 58 in | Wt 122.5 lb

## 2015-05-28 DIAGNOSIS — R7881 Bacteremia: Secondary | ICD-10-CM | POA: Diagnosis not present

## 2015-05-28 DIAGNOSIS — J189 Pneumonia, unspecified organism: Secondary | ICD-10-CM

## 2015-05-28 DIAGNOSIS — B9561 Methicillin susceptible Staphylococcus aureus infection as the cause of diseases classified elsewhere: Secondary | ICD-10-CM

## 2015-05-28 DIAGNOSIS — R634 Abnormal weight loss: Secondary | ICD-10-CM

## 2015-05-28 DIAGNOSIS — R918 Other nonspecific abnormal finding of lung field: Secondary | ICD-10-CM | POA: Diagnosis not present

## 2015-05-28 NOTE — Progress Notes (Signed)
RFV: hospital follow up for MSSA bacteremia nad pneumonia Subjective:    Patient ID: [name], female    DOB: 07-11-1950, 65 y.o.   MRN: [id number]  HPI  [name] 65 year old female with a history of diet-controlled type 2 diabetes mellitus (A1c 6.2), chronic kidney disease stage IV, hypertension, diastolic CHF (EF 99991111) who was hospitalized in February for multifocal pneumonia nad mssa bacteremia. She was treated for extended course of cefazolin to treat complicated bacteremia plus pneumonia. She has had follow up with pulmonary and repeat chest CT that shows some improvement but not complete resolution of pulmonary nodules. The patient has completed her abtx and returns to clinic to be assessed off of abtx. She states that she doesn't have a cough. No fever, chils, nightsweats. Occasionally slips and has a cigarette. She is gaining weight back that she had lost prior to admission  No Known Allergies Current Outpatient Prescriptions on File Prior to Visit  Medication Sig Dispense Refill  . albuterol (PROVENTIL HFA;VENTOLIN HFA) 108 (90 BASE) MCG/ACT inhaler Inhale 2 puffs into the lungs every 6 (six) hours as needed for wheezing or shortness of breath. 1 Inhaler 2  . amLODipine (NORVASC) 10 MG tablet Take 1 tablet (10 mg total) by mouth daily. 30 tablet 2  . atorvastatin (LIPITOR) 80 MG tablet Take 1 tablet (80 mg total) by mouth daily at 6 PM. 30 tablet 2  . benzonatate (TESSALON) 100 MG capsule Take 1 capsule (100 mg total) by mouth 3 (three) times daily as needed for cough. 20 capsule 0  . Blood Glucose Monitoring Suppl (TRUE METRIX AIR GLUCOSE METER) DEVI 1 Device by Does not apply route 4 (four) times daily -  before meals and at bedtime. 1 Device 0  . calcitRIOL (ROCALTROL) 0.5 MCG capsule Take 1 capsule (0.5 mcg total) by mouth daily. 30 capsule 0  . calcium carbonate (OS-CAL - DOSED IN MG OF ELEMENTAL CALCIUM) 1250 (500 Ca) MG tablet Take 1 tablet (500 mg of elemental  calcium total) by mouth 3 (three) times daily with meals. 90 tablet 0  . carvedilol (COREG) 3.125 MG tablet Take 1 tablet (3.125 mg total) by mouth 2 (two) times daily with a meal.    . clopidogrel (PLAVIX) 75 MG tablet Take 1 tablet (75 mg total) by mouth daily. 30 tablet 2  . collagenase (SANTYL) ointment Apply topically daily. Apply to right side of gluteal cleft Unstageable Pressure Injury once daily and PRN soiling. 15 g 0  . Ferrous Sulfate (IRON) 325 (65 FE) MG TABS Take 325 mg by mouth 3 (three) times daily. 90 each 2  . fish oil-omega-3 fatty acids 1000 MG capsule Take 1 g by mouth 3 (three) times daily. Reported on 04/09/2015    . furosemide (LASIX) 80 MG tablet Take 1 tablet by mouth 2 (two) times daily.  6  . glucose blood (TRUE METRIX BLOOD GLUCOSE TEST) test strip Use as instructed 100 each 12  . isosorbide mononitrate (IMDUR) 30 MG 24 hr tablet Take 1 tablet (30 mg total) by mouth daily. 30 tablet 2  . Lancet Devices (ACCU-CHEK SOFTCLIX) lancets Use as instructed daily. 1 each 5  . mometasone-formoterol (DULERA) 100-5 MCG/ACT AERO Inhale 1 puff into the lungs daily. 1 Inhaler 2  . nitroGLYCERIN (NITROSTAT) 0.4 MG SL tablet Place 1 tablet (0.4 mg total) under the tongue every 5 (five) minutes as needed for chest pain. 10 tablet 0  . Nutritional Supplements (FEEDING SUPPLEMENT, NEPRO CARB STEADY,) LIQD  Take 237 mLs by mouth 2 (two) times daily between meals.    . sertraline (ZOLOFT) 50 MG tablet Take 1 tablet (50 mg total) by mouth daily. 30 tablet 3  . sodium bicarbonate 650 MG tablet Take 1 tablet (650 mg total) by mouth 2 (two) times daily. 30 tablet 2  . Vitamin D, Ergocalciferol, (DRISDOL) 50000 units CAPS capsule Take 1 capsule (50,000 Units total) by mouth every 7 (seven) days. 9 capsule 0  . [DISCONTINUED] loratadine (CLARITIN) 10 MG tablet Take 10 mg by mouth daily.       No current facility-administered medications on file prior to visit.   Active Ambulatory Problems     Diagnosis Date Noted  . Hyperlipidemia 05/16/2008  . OBESITY 05/16/2008  . Anemia, chronic disease 05/16/2008  . ANXIETY DEPRESSION 05/16/2008  . History of CVA (cerebrovascular accident) 05/16/2008  . Chronic obstructive pulmonary disease ([place]) 05/16/2008  . DIVERTICULITIS, HX OF 05/16/2008  . Renal artery stenosis ([place]) 11/19/2010  . Peripheral vascular disease ([place]) 11/19/2010  . 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 Diet-controlled diabetes mellitus ([name]) 01/25/2012  . Iron deficiency anemia 01/25/2012  . Gastric angiodysplasia 01/26/2012  . On home oxygen therapy 11/10/2014  . CKD (chronic kidney disease), stage IV ([name])   . Carotid artery disease ([name])   . CAD (coronary artery disease)   . Chronic diastolic CHF (congestive heart failure) ([name])   . Essential hypertension   . Former tobacco use   . Bradycardia   . Abnormal nuclear stress test   . Protein-calorie malnutrition ([name]) 01/24/2015  . Lewy body dementia without behavioral disturbance   . Hypocalcemia 03/20/2015  . Sacral ulcer   . Urinary retention   . Coronary artery disease involving native coronary artery of native heart with angina pectoris ([name])   . HCAP (healthcare-associated pneumonia) 03/29/2015  . Acute respiratory failure ([name]) 03/29/2015  . Hypomagnesemia 03/29/2015  . Low serum vitamin D 03/29/2015  . Metabolic acidosis [id number]  . Malnutrition of moderate degree 03/30/2015  . Palliative care encounter   . Difficulty breathing   . Pulmonary infiltrates   . Staphylococcus aureus bacteremia   . Indwelling catheter present on admission ([name]) 04/21/2015  . CAP (community acquired pneumonia) 05/13/2015   Resolved Ambulatory Problems    Diagnosis Date Noted  . DIABETES MELLITUS, TYPE II 05/16/2008  . HYPOGLYCEMIA 05/16/2008  . DEPRESSION 05/16/2008  . MITRAL REGURGITATION 05/16/2008  . Chronic systolic CHF (congestive heart failure), NYHA class 1 ([name]) 05/16/2008    . TIA 05/16/2008  . Acute blood loss anemia 11/03/2011  . AVM (arteriovenous malformation) 11/03/2011  . GI bleed 11/03/2011  . Diarrhea 11/03/2011  . Dehydration 01/25/2012  . Hyperkalemia 01/25/2012  . Ecoli UTI  01/27/2012  . Acute renal failure ([place]) 09/08/2012  . Community acquired pneumonia 11/07/2014  . Acute on chronic diastolic CHF (congestive heart failure) ([place]) 11/07/2014  . COPD exacerbation ([place]) 11/07/2014  . CHF (congestive heart failure) ([place]) 12/24/2014  . Hypertensive heart disease with congestive heart failure ([place])   . Scabies 01/10/2015  . Proteinuria 01/10/2015  . Acute chest pain 01/21/2015  . Acute on chronic diastolic heart failure ([place])   . AKI (acute kidney injury) ([place])   . Acute respiratory failure with hypoxia ([place]) 03/04/2015  . Acute encephalopathy 03/04/2015  . Acute renal failure superimposed on stage 3 chronic kidney disease ([place]) 03/04/2015  . Generalized weakness 03/04/2015  . Altered mental status   . CKD (chronic kidney  disease)   . Hypernatremia   . Hypokalemia   . Leukocytosis   . Pyrexia   . Pressure ulcer 03/11/2015  . Anemia 03/19/2015  . Absolute anemia   . Malnutrition of moderate degree 03/26/2015   Past Medical History  Diagnosis Date  . Diabetes mellitus   . COPD (chronic obstructive pulmonary disease) ([place])   . Depression   . Diabetic retinopathy ([place])   . GI AVM (gastrointestinal arteriovenous vascular malformation)   . CKD (chronic kidney disease), stage III   . Tobacco abuse   . Dementia    Social History  Substance Use Topics  . Smoking status: Former Smoker -- 0.00 packs/day for 36 years    Types: Cigarettes    Quit date: 02/11/2015  . Smokeless tobacco: Never Used     Comment: uses e-cig occasionally  . Alcohol Use: No  family history includes Angina in her father; Cancer in her brother and sister; Heart attack in her mother; Hypertension in her father and mother. There is no history of Stroke.  Review  of Systems Review of Systems  Constitutional: Negative for fever, chills, diaphoresis, activity change, appetite change, fatigue and unexpected weight change.  HENT: Negative for congestion, sore throat, rhinorrhea, sneezing, trouble swallowing and sinus pressure.  Eyes: Negative for photophobia and visual disturbance.  Respiratory: Negative for cough, chest tightness, shortness of breath, wheezing and stridor.  Cardiovascular: Negative for chest pain, palpitations and leg swelling.  Gastrointestinal: Negative for nausea, vomiting, abdominal pain, diarrhea, constipation, blood in stool, abdominal distention and anal bleeding.  Genitourinary: Negative for dysuria, hematuria, flank pain and difficulty urinating.  Musculoskeletal: Negative for myalgias, back pain, joint swelling, arthralgias and gait problem.  Skin: Negative for color change, pallor, rash and wound.  Neurological: Negative for dizziness, tremors, weakness and light-headedness.  Hematological: Negative for adenopathy. Does not bruise/bleed easily.  Psychiatric/Behavioral: Negative for behavioral problems, confusion, sleep disturbance, dysphoric mood, decreased concentration and agitation.       Objective:   Physical Exam  BP 174/77 mmHg  Pulse 88  Temp(Src) 98.2 F (36.8 C) (Oral)  Ht 4\' 10"  (1.473 m)  Wt 122 lb 8 oz (55.566 kg)  BMI 25.61 kg/m2 Physical Exam  Constitutional:  oriented to person, place, and time. appears well-developed and well-nourished. No distress. Older than stated age HENT: [place]/AT, PERRLA, no scleral icterus, poor dentition Mouth/Throat: Oropharynx is clear and moist. No oropharyngeal exudate.  Cardiovascular: Normal rate, regular rhythm and normal heart sounds. Exam reveals no gallop and no friction rub.  No murmur heard.  Pulmonary/Chest: Effort normal and breath sounds normal. No respiratory distress.  has no wheezes.  Neck = supple, no nuchal rigidity Skin: Skin is warm and dry. No rash noted.  No erythema.  Psychiatric: a normal mood and affect.  behavior is normal.   CT on 3/21: FINDINGS: The multiple a nodular masslike opacities throughout both lungs right greater the left on the prior CT have improved in the interval. Some these opacities have resolved completely. However, as on image 12 the somewhat linear opacity noted previously in the right upper lobe has definitely improved with 2 posteriorly positioned opacities previously have resolved. There is an irregularly marginated lesion within the inferior anterior right upper lobe of 19 mm in diameter on image 17 which has decreased in size in the interval. A 5 mm nodule in the right lower lobe is completely stable. An opacity posterior medially in the right lower lobe on image 39 measures 25 x 17 mm  compared to prior measurements of 41 x 24 mm. Opacity peripherally in the right lower lobe on the prior study has cleared. There is a lobulated nodular opacity within the right middle lobe medially and anteriorly on image 37 which is not definitely seen previously. The smaller left lung opacities have cleared in the interval. No pleural effusion is seen. The central airway and is patent.  On soft tissue window images, the thyroid gland is unremarkable. A superior mediastinal node adjacent to the esophagus on the right on image 8 measures 6 mm in diameter compared to 10 mm in short axis diameter previously. A pretracheal node on image 15 measures 10 mm compared to 16 mm previously. A pre carinal node on image 19 measures 12 mm compared to 13 mm previously. No enlarging mediastinal or hilar adenopathy is seen. As noted previously diffuse coronary artery calcifications are prior and cardiomegaly is stable. The portion of the upper abdomen that is visualized is unremarkable. There may be small left renal cysts, not well characterized on this unenhanced CT. Moderate atherosclerotic change of the entire thoracic aorta is noted.  There are degenerative changes throughout the thoracic spine in this patient with thoracic scoliosis convex to the right.  IMPRESSION: 1. There has been improvement in the previously described nodular masslike opacities throughout the lungs right greater than left. Some of these opacities although they have diminished in size persist and continued followup is recommended. 2. Decrease in mediastinal adenopathy most likely reactive. 3. Diffuse coronary artery calcifications.       Assessment & Plan:  Staph aureus bacteremia = completed course of iv abtx  Multifocal pneumonia = responded to treatment of iv abtx plus having resolution of affected area  Pulmonary nodules = recommend to follow up with pulmonary for repeat imaging to see if further work up is necessary to evaluate findings, initially attributed to infection  Weight loss = continue with dietary supplementation, increase protein in diet

## 2015-06-09 MED FILL — SERTRALINE HCL 50 MG TABLET: 50 | 30 days supply | Qty: 30 | Fill #2

## 2015-06-09 MED FILL — ATORVASTATIN 80 MG TABLET: 80 | 30 days supply | Qty: 30 | Fill #2

## 2015-06-09 MED FILL — SODIUM BICARB 650 MG TABLET: 650 | 15 days supply | Qty: 30 | Fill #0

## 2015-06-09 MED FILL — FUROSEMIDE 80 MG TABLET: 80 | 30 days supply | Qty: 60 | Fill #1

## 2015-06-09 MED FILL — VIT D2 1.25 MG (50,000 UNIT: 1.25 MG | 28 days supply | Qty: 4 | Fill #0

## 2015-06-09 MED FILL — CALCITRIOL 0.5 MCG CAPSULE: 0.5 | 30 days supply | Qty: 30 | Fill #0

## 2015-06-09 MED FILL — AMLODIPINE BESYLATE 10 MG T: 10 | 30 days supply | Qty: 30 | Fill #1

## 2015-06-09 MED FILL — FERROUS SULFATE 325 MG TAB: 325 (65 FE) | 30 days supply | Qty: 90 | Fill #2

## 2015-06-09 MED FILL — CLOPIDOGREL 75 MG TABLET: 75 | 30 days supply | Qty: 30 | Fill #2

## 2015-06-09 MED FILL — ISOSORBIDE MN ER 30 MG TAB: 30 | 30 days supply | Qty: 30 | Fill #2

## 2015-07-08 ENCOUNTER — Other Ambulatory Visit: Payer: Self-pay | Admitting: Family Medicine

## 2015-07-08 MED FILL — ISOSORBIDE MN ER 30 MG TAB: 30 | 30 days supply | Qty: 30 | Fill #0

## 2015-07-08 MED FILL — SODIUM BICARB 650 MG TABLET: 650 | 15 days supply | Qty: 30 | Fill #1

## 2015-07-08 MED FILL — SERTRALINE HCL 50 MG TABLET: 50 | 30 days supply | Qty: 30 | Fill #3

## 2015-07-08 MED FILL — AMLODIPINE BESYLATE 10 MG T: 10 | 30 days supply | Qty: 30 | Fill #2

## 2015-07-08 MED FILL — FUROSEMIDE 80 MG TABLET: 80 | 30 days supply | Qty: 60 | Fill #2

## 2015-07-08 MED FILL — CLOPIDOGREL 75 MG TABLET: 75 | 30 days supply | Qty: 30 | Fill #0

## 2015-07-08 MED FILL — ATORVASTATIN 80 MG TABLET: 80 | 30 days supply | Qty: 30 | Fill #1

## 2015-07-08 MED FILL — VIT D2 1.25 MG (50,000 UNIT: 1.25 MG | 28 days supply | Qty: 4 | Fill #1

## 2015-08-10 ENCOUNTER — Other Ambulatory Visit: Payer: Self-pay | Admitting: Family Medicine

## 2015-08-10 MED FILL — AMLODIPINE BESYLATE 10 MG T: 10 | 30 days supply | Qty: 30 | Fill #0

## 2015-08-10 MED FILL — CALCITRIOL 0.5 MCG CAPSULE: 0.5 | 30 days supply | Qty: 30 | Fill #0

## 2015-08-10 MED FILL — SODIUM BICARB 650 MG TABLET: 650 | 15 days supply | Qty: 30 | Fill #2

## 2015-08-10 MED FILL — ?SERTRALINE HCL 50 MG TABLE: 50 | 30 days supply | Qty: 30 | Fill #1

## 2015-08-10 MED FILL — ISOSORBIDE MN ER 30 MG TAB: 30 | 30 days supply | Qty: 30 | Fill #1

## 2015-08-10 MED FILL — FERROUS SULFATE 325 MG TAB: 325 (65 FE) | 30 days supply | Qty: 90 | Fill #0

## 2015-08-10 MED FILL — VIT D2 1.25 MG (50,000 UNIT: 1.25 MG | 6 days supply | Qty: 1 | Fill #2

## 2015-08-10 MED FILL — CLOPIDOGREL 75 MG TABLET: 75 | 30 days supply | Qty: 30 | Fill #1

## 2015-08-10 MED FILL — FUROSEMIDE 80 MG TABLET: 80 | 30 days supply | Qty: 60 | Fill #3

## 2015-08-11 ENCOUNTER — Encounter: Payer: Self-pay | Admitting: Family Medicine

## 2015-08-11 ENCOUNTER — Ambulatory Visit: Payer: PPO | Attending: Family Medicine | Admitting: Family Medicine

## 2015-08-11 VITALS — BP 136/68 | HR 74 | Temp 98.3°F | Resp 18 | Ht 59.0 in | Wt 134.8 lb

## 2015-08-11 DIAGNOSIS — N3001 Acute cystitis with hematuria: Secondary | ICD-10-CM

## 2015-08-11 DIAGNOSIS — E119 Type 2 diabetes mellitus without complications: Secondary | ICD-10-CM

## 2015-08-11 LAB — GLUCOSE, POCT (MANUAL RESULT ENTRY): POC Glucose: 148 mg/dl — AB (ref 70–99)

## 2015-08-11 LAB — POCT URINALYSIS DIPSTICK
BILIRUBIN UA: NEGATIVE
GLUCOSE UA: NEGATIVE
KETONES UA: NEGATIVE
NITRITE UA: NEGATIVE
PH UA: 5.5
Protein, UA: >300
Spec Grav, UA: 1.02
Urobilinogen, UA: 0.2

## 2015-08-11 LAB — POCT GLYCOSYLATED HEMOGLOBIN (HGB A1C): Hemoglobin A1C: 4.3

## 2015-08-11 MED ORDER — CIPROFLOXACIN HCL 500 MG PO TABS: 500.0000 mg | ORAL_TABLET | Freq: Two times a day (BID) | ORAL | Status: DC

## 2015-08-11 MED FILL — CIPROFLOXACIN HCL 500 MG TA: 500 | 3 days supply | Qty: 6 | Fill #0

## 2015-08-11 NOTE — Progress Notes (Signed)
Pt thinks she may have a UTI.  Pt reports lower back pain rated at a 7.  Pt took medications today. Pt has eaten today.  CBG is 148. A1C is 4.3.

## 2015-08-11 NOTE — Progress Notes (Signed)
Subjective:  Patient ID: [name], female    DOB: February 07, 1951  Age: 65 y.o. MRN: [id number]  CC: No chief complaint on file.   HPI [name] is a 65 year old female with history of type 2 diabetes mellitus (diet controlled with A1c of 4.9)  , COPD, HTN, CKD, Diastolic CHF who comes into the clinic complaining of bilateral flank pain, abdominal pain, dysuria. Denies fever or chills.  Has no other complaints today  Outpatient Prescriptions Prior to Visit  Medication Sig Dispense Refill  . albuterol (PROVENTIL HFA;VENTOLIN HFA) 108 (90 BASE) MCG/ACT inhaler Inhale 2 puffs into the lungs every 6 (six) hours as needed for wheezing or shortness of breath. 1 Inhaler 2  . amLODipine (NORVASC) 10 MG tablet Take 1 tablet (10 mg total) by mouth daily. 30 tablet 2  . atorvastatin (LIPITOR) 80 MG tablet Take 1 tablet (80 mg total) by mouth daily at 6 PM. 30 tablet 2  . benzonatate (TESSALON) 100 MG capsule Take 1 capsule (100 mg total) by mouth 3 (three) times daily as needed for cough. 20 capsule 0  . Blood Glucose Monitoring Suppl (TRUE METRIX AIR GLUCOSE METER) DEVI 1 Device by Does not apply route 4 (four) times daily -  before meals and at bedtime. 1 Device 0  . calcitRIOL (ROCALTROL) 0.5 MCG capsule TAKE 1 CAPSULE BY MOUTH DAILY. 30 capsule 2  . calcium carbonate (OS-CAL - DOSED IN MG OF ELEMENTAL CALCIUM) 1250 (500 Ca) MG tablet Take 1 tablet (500 mg of elemental calcium total) by mouth 3 (three) times daily with meals. 90 tablet 0  . carvedilol (COREG) 3.125 MG tablet Take 1 tablet (3.125 mg total) by mouth 2 (two) times daily with a meal.    . clopidogrel (PLAVIX) 75 MG tablet TAKE 1 TABLET BY MOUTH DAILY 30 tablet 2  . ferrous sulfate 325 (65 FE) MG tablet TAKE ONE TABLET BY MOUTH THREE TIMES DAILY 90 tablet 2  . fish oil-omega-3 fatty acids 1000 MG capsule Take 1 g by mouth 3 (three) times daily. Reported on 04/09/2015    . furosemide (LASIX) 80 MG tablet Take 1 tablet by mouth 2  (two) times daily.  6  . glucose blood (TRUE METRIX BLOOD GLUCOSE TEST) test strip Use as instructed 100 each 12  . isosorbide mononitrate (IMDUR) 30 MG 24 hr tablet Take 1 tablet (30 mg total) by mouth daily. 30 tablet 2  . Lancet Devices (ACCU-CHEK SOFTCLIX) lancets Use as instructed daily. 1 each 5  . mometasone-formoterol (DULERA) 100-5 MCG/ACT AERO Inhale 1 puff into the lungs daily. 1 Inhaler 2  . nitroGLYCERIN (NITROSTAT) 0.4 MG SL tablet Place 1 tablet (0.4 mg total) under the tongue every 5 (five) minutes as needed for chest pain. 10 tablet 0  . Nutritional Supplements (FEEDING SUPPLEMENT, NEPRO CARB STEADY,) LIQD Take 237 mLs by mouth 2 (two) times daily between meals.    . sertraline (ZOLOFT) 50 MG tablet Take 1 tablet (50 mg total) by mouth daily. 30 tablet 3  . sodium bicarbonate 650 MG tablet Take 1 tablet (650 mg total) by mouth 2 (two) times daily. 30 tablet 2  . Vitamin D, Ergocalciferol, (DRISDOL) 50000 units CAPS capsule Take 1 capsule (50,000 Units total) by mouth every 7 (seven) days. 9 capsule 0  . collagenase (SANTYL) ointment Apply topically daily. Apply to right side of gluteal cleft Unstageable Pressure Injury once daily and PRN soiling. (Patient not taking: Reported on 08/11/2015) 15 g 0  No facility-administered medications prior to visit.    ROS Review of Systems  Constitutional: Negative for activity change and appetite change.  HENT: Negative for sinus pressure and sore throat.   Respiratory: Negative for chest tightness, shortness of breath and wheezing.   Cardiovascular: Negative for chest pain and palpitations.  Gastrointestinal: Negative for abdominal pain, constipation and abdominal distention.  Genitourinary:       See hpi  Musculoskeletal: Positive for back pain (bilateral flank pain).  Psychiatric/Behavioral: Negative for behavioral problems and dysphoric mood.    Objective:  BP 136/68 mmHg  Pulse 74  Temp(Src) 98.3 F (36.8 C) (Oral)  Resp 18   Ht 4\' 11"  (1.499 m)  Wt 134 lb 12.8 oz (61.145 kg)  BMI 27.21 kg/m2  SpO2 92%  BP/Weight 08/11/2015 05/28/2015 [id number]  Systolic BP [id number]  Diastolic BP 68 77 62  Wt. (Lbs) 134.8 122.5 120.8  BMI 27.21 25.61 25.25      Physical Exam  Constitutional: She is oriented to person, place, and time. She appears well-developed and well-nourished.  Cardiovascular: Normal rate, normal heart sounds and intact distal pulses.   No murmur heard. Pulmonary/Chest: Effort normal and breath sounds normal. She has no wheezes. She has no rales. She exhibits no tenderness.  Abdominal: Soft. Bowel sounds are normal. She exhibits no distension and no mass. There is no tenderness.  Musculoskeletal: Normal range of motion.  Neurological: She is alert and oriented to person, place, and time.     Assessment & Plan:   1. Diet-controlled diabetes mellitus ([place]) Diet controlled with A1c of 4.3 - Glucose (CBG) - HgB A1c  2. Urinary tract infection Placed on Cipro UA positive for blood and leukocytes - Urinalysis Dipstick     Follow-up: 3 weeks for follow-up of chronic medical conditions.  Arnoldo Morale MD

## 2015-09-09 ENCOUNTER — Other Ambulatory Visit: Payer: Self-pay | Admitting: Family Medicine

## 2015-09-09 MED FILL — SODIUM BICARB 10 GRAIN TAB: 650 | 30 days supply | Qty: 60 | Fill #0

## 2015-09-09 MED FILL — AMLODIPINE BESYLATE 10 MG T: 10 | 30 days supply | Qty: 30 | Fill #1

## 2015-09-09 MED FILL — ISOSORBIDE MN ER 30 MG TAB: 30 | 30 days supply | Qty: 30 | Fill #2

## 2015-09-09 MED FILL — CLOPIDOGREL 75 MG TABLET: 75 | 30 days supply | Qty: 30 | Fill #2

## 2015-09-09 MED FILL — CALCITRIOL 0.5 MCG CAPSULE: 0.5 | 30 days supply | Qty: 30 | Fill #1

## 2015-09-09 MED FILL — ?SERTRALINE HCL 50 MG TABLE: 50 | 30 days supply | Qty: 30 | Fill #2

## 2015-09-09 MED FILL — ?FUROSEMIDE 80MG TABLET: 80 | 30 days supply | Qty: 60 | Fill #4

## 2015-09-09 MED FILL — FERROUS SULFATE 325 MG TAB: 325 (65 FE) | 30 days supply | Qty: 90 | Fill #1

## 2015-09-09 NOTE — Telephone Encounter (Signed)
Refill request

## 2015-10-14 ENCOUNTER — Other Ambulatory Visit: Payer: Self-pay | Admitting: Family Medicine

## 2015-10-14 DIAGNOSIS — N184 Chronic kidney disease, stage 4 (severe): Secondary | ICD-10-CM

## 2015-10-14 DIAGNOSIS — I5032 Chronic diastolic (congestive) heart failure: Secondary | ICD-10-CM

## 2015-10-14 DIAGNOSIS — I10 Essential (primary) hypertension: Secondary | ICD-10-CM

## 2015-10-14 MED FILL — ?AMLODIPINE BESYLATE 10 MG: 10 | 30 days supply | Qty: 30 | Fill #2

## 2015-10-14 MED FILL — FERROUS SULFATE 325 MG TAB: 325 (65 FE) | 30 days supply | Qty: 90 | Fill #2

## 2015-10-14 MED FILL — ?SERTRALINE HCL 50 MG TABLE: 50 | 30 days supply | Qty: 30 | Fill #3

## 2015-10-14 MED FILL — ?FUROSEMIDE 80MG TABLET: 80 | 30 days supply | Qty: 60 | Fill #5

## 2015-10-14 MED FILL — CALCITRIOL 0.5 MCG CAPSULE: 0.5 | 30 days supply | Qty: 30 | Fill #2

## 2015-10-14 MED FILL — CLOPIDOGREL 75 MG TABLET: 75 | 30 days supply | Qty: 30 | Fill #3

## 2015-10-19 ENCOUNTER — Other Ambulatory Visit: Payer: Self-pay | Admitting: Family Medicine

## 2015-10-19 DIAGNOSIS — E785 Hyperlipidemia, unspecified: Secondary | ICD-10-CM

## 2015-10-19 MED ORDER — ATORVASTATIN CALCIUM 80 MG PO TABS: 80.0000 mg | ORAL_TABLET | Freq: Every day | ORAL | 2 refills | Status: DC

## 2015-11-13 ENCOUNTER — Ambulatory Visit (INDEPENDENT_AMBULATORY_CARE_PROVIDER_SITE_OTHER)
Admission: RE | Admit: 2015-11-13 | Discharge: 2015-11-13 | Disposition: A | Discharge status: Home / Self Care | Payer: PPO | Source: Ambulatory Visit | Attending: Pulmonary Disease | Admitting: Pulmonary Disease

## 2015-11-13 DIAGNOSIS — Z8701 Personal history of pneumonia (recurrent): Secondary | ICD-10-CM | POA: Diagnosis not present

## 2015-11-16 ENCOUNTER — Other Ambulatory Visit: Payer: Self-pay | Admitting: Family Medicine

## 2015-11-16 DIAGNOSIS — F341 Dysthymic disorder: Secondary | ICD-10-CM

## 2015-11-16 DIAGNOSIS — I10 Essential (primary) hypertension: Secondary | ICD-10-CM

## 2015-11-16 MED FILL — CALCITRIOL 0.5 MCG CAPSULE: 0.5 | 30 days supply | Qty: 30 | Fill #3

## 2015-11-16 MED FILL — ?SERTRALINE HCL 50 MG TABLE: 50 | 30 days supply | Qty: 30 | Fill #0

## 2015-11-16 MED FILL — FERROUS SULFATE 325 MG TAB: 325 (65 FE) | 30 days supply | Qty: 90 | Fill #0

## 2015-11-16 MED FILL — AMLODIPINE BESYLATE 10 MG T: 10 | 30 days supply | Qty: 30 | Fill #0

## 2015-11-25 ENCOUNTER — Other Ambulatory Visit: Payer: Self-pay | Admitting: Pulmonary Disease

## 2015-11-25 DIAGNOSIS — J189 Pneumonia, unspecified organism: Secondary | ICD-10-CM

## 2015-11-25 DIAGNOSIS — J438 Other emphysema: Secondary | ICD-10-CM

## 2015-12-23 ENCOUNTER — Encounter: Payer: Self-pay | Admitting: Family Medicine

## 2015-12-23 ENCOUNTER — Ambulatory Visit: Payer: PPO | Attending: Family Medicine | Admitting: Family Medicine

## 2015-12-23 VITALS — BP 161/79 | HR 68 | Temp 98.2°F | Ht 59.0 in | Wt 122.2 lb

## 2015-12-23 DIAGNOSIS — J449 Chronic obstructive pulmonary disease, unspecified: Secondary | ICD-10-CM

## 2015-12-23 DIAGNOSIS — I701 Atherosclerosis of renal artery: Secondary | ICD-10-CM

## 2015-12-23 DIAGNOSIS — E1151 Type 2 diabetes mellitus with diabetic peripheral angiopathy without gangrene: Secondary | ICD-10-CM | POA: Diagnosis not present

## 2015-12-23 DIAGNOSIS — I25119 Atherosclerotic heart disease of native coronary artery with unspecified angina pectoris: Secondary | ICD-10-CM

## 2015-12-23 DIAGNOSIS — Z7902 Long term (current) use of antithrombotics/antiplatelets: Secondary | ICD-10-CM | POA: Diagnosis not present

## 2015-12-23 DIAGNOSIS — I5032 Chronic diastolic (congestive) heart failure: Secondary | ICD-10-CM | POA: Diagnosis not present

## 2015-12-23 DIAGNOSIS — I130 Hypertensive heart and chronic kidney disease with heart failure and stage 1 through stage 4 chronic kidney disease, or unspecified chronic kidney disease: Secondary | ICD-10-CM | POA: Insufficient documentation

## 2015-12-23 DIAGNOSIS — F418 Other specified anxiety disorders: Secondary | ICD-10-CM | POA: Diagnosis not present

## 2015-12-23 DIAGNOSIS — E7800 Pure hypercholesterolemia, unspecified: Secondary | ICD-10-CM | POA: Diagnosis not present

## 2015-12-23 DIAGNOSIS — E119 Type 2 diabetes mellitus without complications: Secondary | ICD-10-CM | POA: Diagnosis not present

## 2015-12-23 DIAGNOSIS — G4700 Insomnia, unspecified: Secondary | ICD-10-CM | POA: Insufficient documentation

## 2015-12-23 DIAGNOSIS — I10 Essential (primary) hypertension: Secondary | ICD-10-CM | POA: Diagnosis not present

## 2015-12-23 DIAGNOSIS — E1122 Type 2 diabetes mellitus with diabetic chronic kidney disease: Secondary | ICD-10-CM | POA: Insufficient documentation

## 2015-12-23 DIAGNOSIS — Z79899 Other long term (current) drug therapy: Secondary | ICD-10-CM | POA: Insufficient documentation

## 2015-12-23 DIAGNOSIS — J438 Other emphysema: Secondary | ICD-10-CM

## 2015-12-23 DIAGNOSIS — N184 Chronic kidney disease, stage 4 (severe): Secondary | ICD-10-CM | POA: Diagnosis not present

## 2015-12-23 DIAGNOSIS — F341 Dysthymic disorder: Secondary | ICD-10-CM

## 2015-12-23 LAB — POCT GLYCOSYLATED HEMOGLOBIN (HGB A1C): HEMOGLOBIN A1C: 5.7

## 2015-12-23 LAB — GLUCOSE, POCT (MANUAL RESULT ENTRY): POC GLUCOSE: 99 mg/dL (ref 70–99)

## 2015-12-23 MED ORDER — ISOSORBIDE MONONITRATE ER 30 MG PO TB24: 30.0000 mg | ORAL_TABLET | Freq: Every day | ORAL | 5 refills | Status: DC

## 2015-12-23 MED ORDER — NIFEDIPINE ER OSMOTIC RELEASE 60 MG PO TB24: 60.0000 mg | ORAL_TABLET | Freq: Every day | ORAL | 5 refills | Status: DC

## 2015-12-23 MED ORDER — CARVEDILOL 3.125 MG PO TABS: 3.1250 mg | ORAL_TABLET | Freq: Two times a day (BID) | ORAL | 5 refills | Status: DC

## 2015-12-23 MED ORDER — CLOPIDOGREL BISULFATE 75 MG PO TABS: 75.0000 mg | ORAL_TABLET | Freq: Every day | ORAL | 5 refills | Status: DC

## 2015-12-23 MED ORDER — ALBUTEROL SULFATE HFA 108 (90 BASE) MCG/ACT IN AERS: 2.0000 | INHALATION_SPRAY | Freq: Four times a day (QID) | RESPIRATORY_TRACT | 2 refills | Status: DC | PRN

## 2015-12-23 MED ORDER — SODIUM BICARBONATE 650 MG PO TABS: 650.0000 mg | ORAL_TABLET | Freq: Two times a day (BID) | ORAL | 5 refills | Status: DC

## 2015-12-23 MED ORDER — SERTRALINE HCL 50 MG PO TABS: 50.0000 mg | ORAL_TABLET | Freq: Every day | ORAL | 5 refills | Status: DC

## 2015-12-23 MED ORDER — ATORVASTATIN CALCIUM 80 MG PO TABS: 80.0000 mg | ORAL_TABLET | Freq: Every day | ORAL | 5 refills | Status: DC

## 2015-12-23 MED ORDER — HYDROXYZINE HCL 25 MG PO TABS: 25.0000 mg | ORAL_TABLET | Freq: Three times a day (TID) | ORAL | 2 refills | Status: DC | PRN

## 2015-12-23 MED ORDER — CALCITRIOL 0.5 MCG PO CAPS: 0.5000 ug | ORAL_CAPSULE | Freq: Every day | ORAL | 5 refills | Status: DC

## 2015-12-23 MED ORDER — FERROUS SULFATE 325 (65 FE) MG PO TABS: 325.0000 mg | ORAL_TABLET | Freq: Three times a day (TID) | ORAL | 5 refills | Status: DC

## 2015-12-23 MED FILL — VENTOLIN HFA 90 MCG INHALER: 108 (90 BAS | 25 days supply | Qty: 18 | Fill #0

## 2015-12-23 MED FILL — ?CARVEDILOL 3.125 MG TABLET: 3.125 | 30 days supply | Qty: 60 | Fill #0

## 2015-12-23 MED FILL — NIFEDIPINE ER 60 MG TABLET: 60 | 30 days supply | Qty: 30 | Fill #0

## 2015-12-23 MED FILL — ?ISOSORBIDE MN ER 30 MG TAB: 30 | 30 days supply | Qty: 30 | Fill #0

## 2015-12-23 MED FILL — CLOPIDOGREL 75 MG TABLET: 75 | 30 days supply | Qty: 30 | Fill #0

## 2015-12-23 MED FILL — SODIUM BICARB 10 GRAIN TAB: 650 | 30 days supply | Qty: 60 | Fill #0

## 2015-12-23 MED FILL — hydrOXYzine HCL 25 MG TABS: 25 | 20 days supply | Qty: 60 | Fill #0

## 2015-12-23 MED FILL — ?SERTRALINE HCL 50 MG TABLE: 50 | 30 days supply | Qty: 30 | Fill #0

## 2015-12-23 MED FILL — ATORVASTATIN 80 MG TABLET: 80 | 30 days supply | Qty: 30 | Fill #0

## 2015-12-23 MED FILL — FERROUS SULFATE 325 MG TAB: 325 (65 FE) | 30 days supply | Qty: 90 | Fill #0

## 2015-12-23 MED FILL — CALCITRIOL 0.5 MCG CAPSULE: 0.5 | 30 days supply | Qty: 30 | Fill #0

## 2015-12-23 NOTE — Patient Instructions (Signed)

## 2015-12-23 NOTE — Progress Notes (Signed)
Subjective:  Patient ID: [name], female    DOB: 04/06/1950  Age: 65 y.o. MRN: [id number]  CC: Diabetes; Hypertension; and Hyperlipidemia   HPI [name] is a 65 year old female with a history of diet-controlled type 2 diabetes mellitus (A1c 5.7), chronic kidney disease stage IV, hypertension, diastolic CHF (EF 0000000) who comes in today for follow-up visit. Her blood pressure is still elevated today but she has been out of her medications. BP was also elevated ather Nephrology visit.   CKD is managed by [facility]; Diabetes is diet controlled. Denies numbness or visual symptoms.  She denies any shortness of breath or chest pain at this time and no longer needs oxygen at night; uses her Proventil MDI sparingly. No recent COPD exacerbations.  Endorses anxiety and insomnia; remains on Zoloft. Denies depression, suicidal ideations or intents.  Past Medical History:  Diagnosis Date  . Anemia, chronic disease 05/16/2008   Qualifier: Diagnosis of  By: Percival Spanish, MD, Farrel Gordon    . Bradycardia    a. Limiting BB titration.  [name] CAD (coronary artery disease)   . Carotid artery disease ([place])    a. Duplex 12/2014: left CEA patent with elevated velocities, 60-79% RICA (stable over serial exams), 123456 LICA stenosis (stable over serial exams), 50% LECA, elevated velocities in bilateral subclavian arteries.  . Chronic diastolic CHF (congestive heart failure) ([place])   . CKD (chronic kidney disease), stage III   . COPD (chronic obstructive pulmonary disease) (HCC)    home oxygen  . Dementia   . Depression   . Diabetes mellitus   . Diabetic retinopathy ([place])   . Essential hypertension   . GI AVM (gastrointestinal arteriovenous vascular malformation)    small bowel  . Hyperlipidemia   . Hypertensive heart disease with congestive heart failure ([place])   . Iron deficiency anemia   . Peripheral vascular disease ([place])    a.s /p left common iliac and external iliac artery stents. b.  H/o subclavian, mesenteric, and celiac artery stenosis (see below).  . Protein-calorie malnutrition ([place]) 01/24/2015  . Renal artery stenosis ([place])    a. Duplex 2014: no obvious evidence of hemodynamically significant stenosis >60%, bilateral intrarenal arteries exhibit absent diastolic flow. There is evidence of elevated velocities of the mid aorta. 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  . Tobacco abuse   . 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 APPENDECTOMY    . CARDIAC CATHETERIZATION N/A 01/23/2015   Procedure: Left Heart Cath and Coronary Angiography;  Surgeon: [name], MD;  Location: [facility];  Service: Cardiovascular;  Laterality: N/A;  . CAROTID ENDARTERECTOMY    . CESAREAN SECTION    . CHOLECYSTECTOMY    . ENTEROSCOPY  01/26/2012   Procedure: ENTEROSCOPY;  Surgeon: Gatha Mayer, MD;  Location: [facility];  Service: Endoscopy;  Laterality: N/A;  . ENTEROSCOPY N/A 03/25/2015   Procedure: ENTEROSCOPY;  Surgeon: Doran Stabler, MD;  Location: [facility];  Service: Endoscopy;  Laterality: N/A;  . ILIAC ARTERY STENT     left  . SHOULDER ARTHROSCOPY    . TONSILLECTOMY      No Known Allergies   Outpatient Medications Prior to Visit  Medication Sig Dispense Refill  . fish oil-omega-3 fatty acids 1000 MG capsule Take 1 g by mouth 3 (three) times daily. Reported on 04/09/2015    . furosemide (LASIX)  80 MG tablet Take 1 tablet by mouth 2 (two) times daily.  6  . ferrous sulfate 325 (65 FE) MG tablet TAKE ONE TABLET BY MOUTH THREE TIMES DAILY 90 tablet 2  . Blood Glucose Monitoring Suppl (TRUE METRIX AIR GLUCOSE METER) DEVI 1 Device by Does not apply route 4 (four) times daily -  before meals and at bedtime. (Patient not taking: Reported on 12/23/2015) 1 Device 0  . calcium carbonate (OS-CAL - DOSED IN MG OF ELEMENTAL CALCIUM) 1250 (500 Ca) MG  tablet Take 1 tablet (500 mg of elemental calcium total) by mouth 3 (three) times daily with meals. (Patient not taking: Reported on 12/23/2015) 90 tablet 0  . collagenase (SANTYL) ointment Apply topically daily. Apply to right side of gluteal cleft Unstageable Pressure Injury once daily and PRN soiling. (Patient not taking: Reported on 12/23/2015) 15 g 0  . glucose blood (TRUE METRIX BLOOD GLUCOSE TEST) test strip Use as instructed (Patient not taking: Reported on 12/23/2015) 100 each 12  . Lancet Devices (ACCU-CHEK SOFTCLIX) lancets Use as instructed daily. (Patient not taking: Reported on 12/23/2015) 1 each 5  . mometasone-formoterol (DULERA) 100-5 MCG/ACT AERO Inhale 1 puff into the lungs daily. (Patient not taking: Reported on 12/23/2015) 1 Inhaler 2  . nitroGLYCERIN (NITROSTAT) 0.4 MG SL tablet Place 1 tablet (0.4 mg total) under the tongue every 5 (five) minutes as needed for chest pain. (Patient not taking: Reported on 12/23/2015) 10 tablet 0  . Nutritional Supplements (FEEDING SUPPLEMENT, NEPRO CARB STEADY,) LIQD Take 237 mLs by mouth 2 (two) times daily between meals. (Patient not taking: Reported on 12/23/2015)    . albuterol (PROVENTIL HFA;VENTOLIN HFA) 108 (90 BASE) MCG/ACT inhaler Inhale 2 puffs into the lungs every 6 (six) hours as needed for wheezing or shortness of breath. (Patient not taking: Reported on 12/23/2015) 1 Inhaler 2  . amLODipine (NORVASC) 10 MG tablet TAKE 1 TABLET BY MOUTH DAILY. (Patient not taking: Reported on 12/23/2015) 30 tablet 2  . atorvastatin (LIPITOR) 80 MG tablet Take 1 tablet (80 mg total) by mouth daily at 6 PM. (Patient not taking: Reported on 12/23/2015) 30 tablet 2  . benzonatate (TESSALON) 100 MG capsule Take 1 capsule (100 mg total) by mouth 3 (three) times daily as needed for cough. (Patient not taking: Reported on 12/23/2015) 20 capsule 0  . calcitRIOL (ROCALTROL) 0.5 MCG capsule TAKE 1 CAPSULE BY MOUTH DAILY. (Patient not taking: Reported on 12/23/2015) 30 capsule  2  . carvedilol (COREG) 3.125 MG tablet Take 1 tablet (3.125 mg total) by mouth 2 (two) times daily with a meal. (Patient not taking: Reported on 12/23/2015)    . ciprofloxacin (CIPRO) 500 MG tablet Take 1 tablet (500 mg total) by mouth 2 (two) times daily. 6 tablet 0  . clopidogrel (PLAVIX) 75 MG tablet TAKE 1 TABLET BY MOUTH DAILY (Patient not taking: Reported on 12/23/2015) 30 tablet 2  . isosorbide mononitrate (IMDUR) 30 MG 24 hr tablet Take 1 tablet (30 mg total) by mouth daily. (Patient not taking: Reported on 12/23/2015) 30 tablet 2  . isosorbide mononitrate (IMDUR) 30 MG 24 hr tablet TAKE 1 TABLET BY MOUTH DAILY 30 tablet 2  . sertraline (ZOLOFT) 50 MG tablet TAKE 1 TABLET BY MOUTH DAILY (Patient not taking: Reported on 12/23/2015) 30 tablet 3  . sodium bicarbonate 650 MG tablet TAKE 1 TABLET BY MOUTH TWICE DAILY. (Patient not taking: Reported on 12/23/2015) 60 tablet 0  . Vitamin D, Ergocalciferol, (DRISDOL) 50000 units CAPS capsule Take 1  capsule (50,000 Units total) by mouth every 7 (seven) days. (Patient not taking: Reported on 12/23/2015) 9 capsule 0   No facility-administered medications prior to visit.     ROS Review of Systems  Constitutional: Negative for activity change, appetite change and fatigue.  HENT: Negative for congestion, sinus pressure and sore throat.   Eyes: Negative for visual disturbance.  Respiratory: Negative for cough, chest tightness, shortness of breath and wheezing.   Cardiovascular: Negative for chest pain and palpitations.  Gastrointestinal: Negative for abdominal distention, abdominal pain and constipation.  Endocrine: Negative for polydipsia.  Genitourinary: Negative for dysuria and frequency.  Musculoskeletal: Negative for arthralgias and back pain.  Skin: Negative for rash.  Neurological: Negative for tremors, light-headedness and numbness.  Hematological: Does not bruise/bleed easily.  Psychiatric/Behavioral: Positive for sleep disturbance. Negative  for agitation and behavioral problems.       Positive for anxiety    Objective:  BP (!) 161/79 (BP Location: Right Arm, Patient Position: Sitting, Cuff Size: Small)   Pulse 68   Temp 98.2 F (36.8 C) (Oral)   Ht 4\' 11"  (1.499 m)   Wt 122 lb 3.2 oz (55.4 kg)   SpO2 100%   BMI 24.68 kg/m   BP/Weight 12/23/2015 [id number] 0000000  Systolic BP [id number]  Diastolic BP 79 68 77  Wt. (Lbs) 122.2 134.8 122.5  BMI 24.68 27.21 25.61      Physical Exam  Constitutional: She is oriented to person, place, and time. She appears well-developed and well-nourished.  HENT:  Right Ear: External ear normal.  Left Ear: External ear normal.  Cardiovascular: Normal rate, normal heart sounds and intact distal pulses.   No murmur heard. Pulmonary/Chest: Effort normal and breath sounds normal. She has no wheezes. She has no rales. She exhibits no tenderness.  Abdominal: Soft. Bowel sounds are normal. She exhibits no distension and no mass. There is no tenderness.  Musculoskeletal: Normal range of motion.  Neurological: She is alert and oriented to person, place, and time.  Skin: Skin is warm and dry.  Psychiatric: She has a normal mood and affect.     Lab Results  Component Value Date   HGBA1C 5.7 12/23/2015    Assessment & Plan:   1. Renal artery stenosis (HCC) Stable Followed by Nephrology  2. Essential hypertension Uncontrolled - likely due to running out of meds Discontinue Amlodipine and commence Nedipine - carvedilol (COREG) 3.125 MG tablet; Take 1 tablet (3.125 mg total) by mouth 2 (two) times daily with a meal.  Dispense: 30 tablet; Refill: 5 - NIFEdipine (PROCARDIA XL/ADALAT-CC) 60 MG 24 hr tablet; Take 1 tablet (60 mg total) by mouth daily.  Dispense: 30 tablet; Refill: 5  3. Coronary artery disease involving native coronary artery of native heart with angina pectoris (HCC) Stable Risk factor modification - clopidogrel (PLAVIX) 75 MG tablet; Take 1 tablet (75 mg total) by  mouth daily.  Dispense: 30 tablet; Refill: 5 - COMPLETE METABOLIC PANEL WITH GFR; Future - Lipid panel; Future  4. CKD (chronic kidney disease), stage IV (HCC) Avoid Nephrotoxic agents Closely followed by Nephrology - sodium bicarbonate 650 MG tablet; Take 1 tablet (650 mg total) by mouth 2 (two) times daily.  Dispense: 60 tablet; Refill: 5 - ferrous sulfate 325 (65 FE) MG tablet; Take 1 tablet (325 mg total) by mouth 3 (three) times daily.  Dispense: 90 tablet; Refill: 5 - calcitRIOL (ROCALTROL) 0.5 MCG capsule; Take 1 capsule (0.5 mcg total) by mouth daily.  Dispense: 30 capsule;  Refill: 5  5. Other emphysema ([place]) Controlled on only Proventil MDI prn - albuterol (PROVENTIL HFA;VENTOLIN HFA) 108 (90 Base) MCG/ACT inhaler; Inhale 2 puffs into the lungs every 6 (six) hours as needed for wheezing or shortness of breath.  Dispense: 1 Inhaler; Refill: 2  6. Diet-controlled diabetes mellitus (HCC) Last A1c was 5.7 - Glucose (CBG) - HgB A1c  7. Pure hypercholesterolemia Lipid panel ordered - atorvastatin (LIPITOR) 80 MG tablet; Take 1 tablet (80 mg total) by mouth daily at 6 PM.  Dispense: 30 tablet; Refill: 5  8. Chronic diastolic CHF (congestive heart failure) (HCC) EF 55 -60% from 2d echo of 03/2015 - isosorbide mononitrate (IMDUR) 30 MG 24 hr tablet; Take 1 tablet (30 mg total) by mouth daily.  Dispense: 30 tablet; Refill: 5  9. ANXIETY DEPRESSION PHQ9 =12, GAD7 = 12; positive for insomnia Commence Hydroxyzine for anxiety, hopefully it will help with her insomnia - sertraline (ZOLOFT) 50 MG tablet; Take 1 tablet (50 mg total) by mouth daily.  Dispense: 30 tablet; Refill: 5 - hydrOXYzine (ATARAX/VISTARIL) 25 MG tablet; Take 1 tablet (25 mg total) by mouth 3 (three) times daily as needed.  Dispense: 60 tablet; Refill: 2  10. Chronic obstructive pulmonary disease, unspecified COPD type ([place]) Not using oxygen at night any longer - albuterol (PROVENTIL HFA;VENTOLIN HFA) 108 (90 Base)  MCG/ACT inhaler; Inhale 2 puffs into the lungs every 6 (six) hours as needed for wheezing or shortness of breath.  Dispense: 1 Inhaler; Refill: 2   Meds ordered this encounter  Medications  . atorvastatin (LIPITOR) 80 MG tablet    Sig: Take 1 tablet (80 mg total) by mouth daily at 6 PM.    Dispense:  30 tablet    Refill:  5  . carvedilol (COREG) 3.125 MG tablet    Sig: Take 1 tablet (3.125 mg total) by mouth 2 (two) times daily with a meal.    Dispense:  30 tablet    Refill:  5  . clopidogrel (PLAVIX) 75 MG tablet    Sig: Take 1 tablet (75 mg total) by mouth daily.    Dispense:  30 tablet    Refill:  5  . isosorbide mononitrate (IMDUR) 30 MG 24 hr tablet    Sig: Take 1 tablet (30 mg total) by mouth daily.    Dispense:  30 tablet    Refill:  5  . sertraline (ZOLOFT) 50 MG tablet    Sig: Take 1 tablet (50 mg total) by mouth daily.    Dispense:  30 tablet    Refill:  5  . sodium bicarbonate 650 MG tablet    Sig: Take 1 tablet (650 mg total) by mouth 2 (two) times daily.    Dispense:  60 tablet    Refill:  5  . ferrous sulfate 325 (65 FE) MG tablet    Sig: Take 1 tablet (325 mg total) by mouth 3 (three) times daily.    Dispense:  90 tablet    Refill:  5  . calcitRIOL (ROCALTROL) 0.5 MCG capsule    Sig: Take 1 capsule (0.5 mcg total) by mouth daily.    Dispense:  30 capsule    Refill:  5  . NIFEdipine (PROCARDIA XL/ADALAT-CC) 60 MG 24 hr tablet    Sig: Take 1 tablet (60 mg total) by mouth daily.    Dispense:  30 tablet    Refill:  5    Discontinue Amlodipine  . hydrOXYzine (ATARAX/VISTARIL) 25 MG tablet  Sig: Take 1 tablet (25 mg total) by mouth 3 (three) times daily as needed.    Dispense:  60 tablet    Refill:  2  . albuterol (PROVENTIL HFA;VENTOLIN HFA) 108 (90 Base) MCG/ACT inhaler    Sig: Inhale 2 puffs into the lungs every 6 (six) hours as needed for wheezing or shortness of breath.    Dispense:  1 Inhaler    Refill:  2    Follow-up: Return in about 3 months  (around 03/24/2016) for Follow-up on diabetes mellitus.   Arnoldo Morale MD

## 2015-12-23 NOTE — Progress Notes (Signed)
Medication refill on everything

## 2015-12-24 ENCOUNTER — Ambulatory Visit: Payer: PPO | Attending: Family Medicine

## 2015-12-24 DIAGNOSIS — I25119 Atherosclerotic heart disease of native coronary artery with unspecified angina pectoris: Secondary | ICD-10-CM

## 2015-12-24 LAB — LIPID PANEL
Cholesterol: 271 mg/dL — ABNORMAL HIGH (ref 125–200)
HDL: 32 mg/dL — ABNORMAL LOW (ref >=46)
LDL CALC: 172 mg/dL — AB (ref <130)
Total CHOL/HDL Ratio: 8.5 Ratio — ABNORMAL HIGH (ref <=5.0)
Triglycerides: 333 mg/dL — ABNORMAL HIGH (ref <150)
VLDL: 67 mg/dL — ABNORMAL HIGH (ref <30)

## 2015-12-24 LAB — COMPLETE METABOLIC PANEL WITH GFR
ALBUMIN: 3.5 g/dL — AB (ref 3.6–5.1)
ALK PHOS: 78 U/L (ref 33–130)
ALT: 8 U/L (ref 6–29)
AST: 16 U/L (ref 10–35)
BILIRUBIN TOTAL: 0.2 mg/dL (ref 0.2–1.2)
BUN: 32 mg/dL — AB (ref 7–25)
CO2: 22 mmol/L (ref 20–31)
Calcium: 9.8 mg/dL (ref 8.6–10.4)
Chloride: 111 mmol/L — ABNORMAL HIGH (ref 98–110)
Creat: 2.19 mg/dL — ABNORMAL HIGH (ref 0.50–0.99)
GFR, Est African American: 26 mL/min — ABNORMAL LOW (ref >=60)
GFR, Est Non African American: 23 mL/min — ABNORMAL LOW (ref >=60)
GLUCOSE: 73 mg/dL (ref 65–99)
Potassium: 5 mmol/L (ref 3.5–5.3)
SODIUM: 140 mmol/L (ref 135–146)
TOTAL PROTEIN: 6.3 g/dL (ref 6.1–8.1)

## 2015-12-24 NOTE — Progress Notes (Signed)
Patient here for lab visit only 

## 2015-12-28 ENCOUNTER — Telehealth: Payer: Self-pay

## 2015-12-28 NOTE — Telephone Encounter (Signed)
Writer called patient to discuss lab results.  Patient states she is taking her lipitor everyday.  She stated understanding of the results.

## 2015-12-28 NOTE — Telephone Encounter (Signed)
-----   Message from Arnoldo Morale, MD sent at 12/25/2015  4:24 PM EDT ----- Her cholesterol is elevated and I would need to know if she has been compliant with her Lipitor. Kidney function shows a decline but this is stable compared to previous blood tests.

## 2016-01-25 ENCOUNTER — Other Ambulatory Visit: Payer: Self-pay | Admitting: Pharmacist

## 2016-01-25 DIAGNOSIS — F341 Dysthymic disorder: Secondary | ICD-10-CM

## 2016-01-25 DIAGNOSIS — I5032 Chronic diastolic (congestive) heart failure: Secondary | ICD-10-CM

## 2016-01-25 DIAGNOSIS — I25119 Atherosclerotic heart disease of native coronary artery with unspecified angina pectoris: Secondary | ICD-10-CM

## 2016-01-25 DIAGNOSIS — N184 Chronic kidney disease, stage 4 (severe): Secondary | ICD-10-CM

## 2016-01-25 DIAGNOSIS — I10 Essential (primary) hypertension: Secondary | ICD-10-CM

## 2016-01-25 DIAGNOSIS — E7800 Pure hypercholesterolemia, unspecified: Secondary | ICD-10-CM

## 2016-01-25 MED ORDER — SODIUM BICARBONATE 650 MG PO TABS: 650.0000 mg | ORAL_TABLET | Freq: Two times a day (BID) | ORAL | 0 refills | Status: DC

## 2016-01-25 MED ORDER — FERROUS SULFATE 325 (65 FE) MG PO TABS: 325.0000 mg | ORAL_TABLET | Freq: Three times a day (TID) | ORAL | 0 refills | Status: DC

## 2016-01-25 MED ORDER — NIFEDIPINE ER OSMOTIC RELEASE 60 MG PO TB24: 60.0000 mg | ORAL_TABLET | Freq: Every day | ORAL | 0 refills | Status: DC

## 2016-01-25 MED ORDER — SERTRALINE HCL 50 MG PO TABS: 50.0000 mg | ORAL_TABLET | Freq: Every day | ORAL | 0 refills | Status: DC

## 2016-01-25 MED ORDER — ATORVASTATIN CALCIUM 80 MG PO TABS: 80.0000 mg | ORAL_TABLET | Freq: Every day | ORAL | 0 refills | Status: DC

## 2016-01-25 MED ORDER — FUROSEMIDE 80 MG PO TABS: 80.0000 mg | ORAL_TABLET | Freq: Two times a day (BID) | ORAL | 0 refills | Status: DC

## 2016-01-25 MED ORDER — CARVEDILOL 3.125 MG PO TABS: 3.1250 mg | ORAL_TABLET | Freq: Two times a day (BID) | ORAL | 0 refills | Status: DC

## 2016-01-25 MED ORDER — CLOPIDOGREL BISULFATE 75 MG PO TABS: 75.0000 mg | ORAL_TABLET | Freq: Every day | ORAL | 0 refills | Status: DC

## 2016-01-25 MED ORDER — CALCITRIOL 0.5 MCG PO CAPS: 0.5000 ug | ORAL_CAPSULE | Freq: Every day | ORAL | 0 refills | Status: DC

## 2016-01-25 MED ORDER — ISOSORBIDE MONONITRATE ER 30 MG PO TB24: 30.0000 mg | ORAL_TABLET | Freq: Every day | ORAL | 0 refills | Status: DC

## 2016-02-05 ENCOUNTER — Other Ambulatory Visit: Payer: Self-pay | Admitting: Family Medicine

## 2016-02-05 MED ORDER — NIFEDIPINE ER OSMOTIC RELEASE 60 MG PO TB24: 60.0000 mg | ORAL_TABLET | Freq: Every day | ORAL | 1 refills | Status: DC

## 2016-02-20 ENCOUNTER — Other Ambulatory Visit: Payer: Self-pay | Admitting: Family Medicine

## 2016-02-20 DIAGNOSIS — I10 Essential (primary) hypertension: Secondary | ICD-10-CM

## 2016-02-22 DIAGNOSIS — J449 Chronic obstructive pulmonary disease, unspecified: Secondary | ICD-10-CM | POA: Diagnosis not present

## 2016-03-02 ENCOUNTER — Telehealth: Payer: Self-pay | Admitting: Family Medicine

## 2016-03-02 NOTE — Telephone Encounter (Signed)
[facility] called requesting clarification on NIFEdipine (PROCARDIA XL/ADALAT-CC) 60 MG 24 hr tablet Please f/u.

## 2016-03-03 MED ORDER — NIFEDIPINE ER OSMOTIC RELEASE 60 MG PO TB24: 60.0000 mg | ORAL_TABLET | Freq: Every day | ORAL | 5 refills | Status: DC

## 2016-03-03 NOTE — Telephone Encounter (Signed)
New prescription sent to pharmacy 

## 2016-03-04 ENCOUNTER — Encounter: Payer: Self-pay | Admitting: Family Medicine

## 2016-03-04 ENCOUNTER — Ambulatory Visit: Payer: Medicare Other | Attending: Family Medicine | Admitting: Family Medicine

## 2016-03-04 VITALS — BP 175/68 | HR 57 | Temp 98.0°F | Ht 59.0 in | Wt 124.4 lb

## 2016-03-04 DIAGNOSIS — E1151 Type 2 diabetes mellitus with diabetic peripheral angiopathy without gangrene: Secondary | ICD-10-CM | POA: Insufficient documentation

## 2016-03-04 DIAGNOSIS — I130 Hypertensive heart and chronic kidney disease with heart failure and stage 1 through stage 4 chronic kidney disease, or unspecified chronic kidney disease: Secondary | ICD-10-CM | POA: Diagnosis not present

## 2016-03-04 DIAGNOSIS — I5032 Chronic diastolic (congestive) heart failure: Secondary | ICD-10-CM | POA: Insufficient documentation

## 2016-03-04 DIAGNOSIS — N184 Chronic kidney disease, stage 4 (severe): Secondary | ICD-10-CM | POA: Insufficient documentation

## 2016-03-04 DIAGNOSIS — Z79899 Other long term (current) drug therapy: Secondary | ICD-10-CM | POA: Insufficient documentation

## 2016-03-04 DIAGNOSIS — E0800 Diabetes mellitus due to underlying condition with hyperosmolarity without nonketotic hyperglycemic-hyperosmolar coma (NKHHC): Secondary | ICD-10-CM

## 2016-03-04 DIAGNOSIS — I10 Essential (primary) hypertension: Secondary | ICD-10-CM | POA: Diagnosis not present

## 2016-03-04 DIAGNOSIS — E1122 Type 2 diabetes mellitus with diabetic chronic kidney disease: Secondary | ICD-10-CM | POA: Insufficient documentation

## 2016-03-04 DIAGNOSIS — E870 Hyperosmolality and hypernatremia: Secondary | ICD-10-CM | POA: Diagnosis not present

## 2016-03-04 DIAGNOSIS — Z7902 Long term (current) use of antithrombotics/antiplatelets: Secondary | ICD-10-CM | POA: Diagnosis not present

## 2016-03-04 DIAGNOSIS — F0390 Unspecified dementia without behavioral disturbance: Secondary | ICD-10-CM | POA: Diagnosis not present

## 2016-03-04 LAB — GLUCOSE, POCT (MANUAL RESULT ENTRY): POC GLUCOSE: 118 mg/dL — AB (ref 70–99)

## 2016-03-04 MED ORDER — DONEPEZIL HCL 10 MG PO TABS: 10.0000 mg | ORAL_TABLET | Freq: Every day | ORAL | 1 refills | Status: DC

## 2016-03-04 MED ORDER — SODIUM BICARBONATE 650 MG PO TABS: 650.0000 mg | ORAL_TABLET | Freq: Two times a day (BID) | ORAL | 1 refills | Status: DC

## 2016-03-04 NOTE — Progress Notes (Signed)
Using two different pharmacy- ours and mail  bp med to mail ored Sodium bicarb here Question whether she needs oxygen or not- form to fill out Dementia?

## 2016-03-04 NOTE — Progress Notes (Signed)
Subjective:  Patient ID: [name], female    DOB: 05-Nov-1950  Age: 66 y.o. MRN: [id number]  CC: Diabetes; Hypertension; and Hyperlipidemia   HPI [name] s a 66 year old female with a history of diet-controlled type 2 diabetes mellitus (A1c 5.7), chronic kidney disease stage IV, hypertension, diastolic CHF (EF 0000000) who comes in today for follow-up visit. Her blood pressure is still elevated today but she has been out of her Nifedipine.  She is accompanied by her daughter who complains of the patient having memory issues and has forgotten to turn off the stove on some occassions; she is not allowed to drive and does not handle her financial matters due to his memory problems which she describes in the terms "she sometimes behaves like a child". She was previously on a medication for dementia which she stopped when she lost her insurance.  She was previously on oxygen but no longer needs it. Denies shortness of breath, chest pain.  Past Medical History:  Diagnosis Date  . Anemia, chronic disease 05/16/2008   Qualifier: Diagnosis of  By: Percival Spanish, MD, Farrel Gordon    . Bradycardia    a. Limiting BB titration.  [name] CAD (coronary artery disease)   . Carotid artery disease ([place])    a. Duplex 12/2014: left CEA patent with elevated velocities, 60-79% RICA (stable over serial exams), 123456 LICA stenosis (stable over serial exams), 50% LECA, elevated velocities in bilateral subclavian arteries.  . Chronic diastolic CHF (congestive heart failure) ([place])   . CKD (chronic kidney disease), stage III   . COPD (chronic obstructive pulmonary disease) (HCC)    home oxygen  . Dementia   . Depression   . Diabetes mellitus   . Diabetic retinopathy ([place])   . Essential hypertension   . GI AVM (gastrointestinal arteriovenous vascular malformation)    small bowel  . Hyperlipidemia   . Hypertensive heart disease with congestive heart failure ([place])   . Iron deficiency anemia   . Peripheral  vascular disease ([place])    a.s /p left common iliac and external iliac artery stents. b. H/o subclavian, mesenteric, and celiac artery stenosis (see below).  . Protein-calorie malnutrition ([place]) 01/24/2015  . Renal artery stenosis ([place])    a. Duplex 2014: no obvious evidence of hemodynamically significant stenosis >60%, bilateral intrarenal arteries exhibit absent diastolic flow. There is evidence of elevated velocities of the mid aorta. 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  . Tobacco abuse   . 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 APPENDECTOMY    . CARDIAC CATHETERIZATION N/A 01/23/2015   Procedure: Left Heart Cath and Coronary Angiography;  Surgeon: [name], MD;  Location: [place] [facility];  Service: Cardiovascular;  Laterality: N/A;  . CAROTID ENDARTERECTOMY    . CESAREAN SECTION    . CHOLECYSTECTOMY    . ENTEROSCOPY  01/26/2012   Procedure: ENTEROSCOPY;  Surgeon: Gatha Mayer, MD;  [facility];  Service: Endoscopy;  Laterality: N/A;  . ENTEROSCOPY N/A 03/25/2015   Procedure: ENTEROSCOPY;  Surgeon: Doran Stabler, MD;  Location: [facility];  Service: Endoscopy;  Laterality: N/A;  . ILIAC ARTERY STENT     left  . SHOULDER ARTHROSCOPY    . TONSILLECTOMY      No Known Allergies   Outpatient Medications Prior to Visit  Medication Sig Dispense Refill  . albuterol (PROVENTIL HFA;VENTOLIN  HFA) 108 (90 Base) MCG/ACT inhaler Inhale 2 puffs into the lungs every 6 (six) hours as needed for wheezing or shortness of breath. 1 Inhaler 2  . atorvastatin (LIPITOR) 80 MG tablet Take 1 tablet (80 mg total) by mouth daily at 6 PM. 90 tablet 0  . Blood Glucose Monitoring Suppl (TRUE METRIX AIR GLUCOSE METER) DEVI 1 Device by Does not apply route 4 (four) times daily -  before meals and at bedtime. 1 Device 0  . calcitRIOL (ROCALTROL) 0.5 MCG capsule Take 1  capsule (0.5 mcg total) by mouth daily. 90 capsule 0  . calcium carbonate (OS-CAL - DOSED IN MG OF ELEMENTAL CALCIUM) 1250 (500 Ca) MG tablet Take 1 tablet (500 mg of elemental calcium total) by mouth 3 (three) times daily with meals. 90 tablet 0  . carvedilol (COREG) 3.125 MG tablet TAKE 1 TABLET BY MOUTH TWO  TIMES DAILY WITH A MEAL 180 tablet 0  . clopidogrel (PLAVIX) 75 MG tablet Take 1 tablet (75 mg total) by mouth daily. 90 tablet 0  . collagenase (SANTYL) ointment Apply topically daily. Apply to right side of gluteal cleft Unstageable Pressure Injury once daily and PRN soiling. 15 g 0  . ferrous sulfate 325 (65 FE) MG tablet Take 1 tablet (325 mg total) by mouth 3 (three) times daily. 270 tablet 0  . fish oil-omega-3 fatty acids 1000 MG capsule Take 1 g by mouth 3 (three) times daily. Reported on 04/09/2015    . furosemide (LASIX) 80 MG tablet Take 1 tablet (80 mg total) by mouth 2 (two) times daily. 180 tablet 0  . glucose blood (TRUE METRIX BLOOD GLUCOSE TEST) test strip Use as instructed 100 each 12  . hydrOXYzine (ATARAX/VISTARIL) 25 MG tablet Take 1 tablet (25 mg total) by mouth 3 (three) times daily as needed. 60 tablet 2  . isosorbide mononitrate (IMDUR) 30 MG 24 hr tablet Take 1 tablet (30 mg total) by mouth daily. 90 tablet 0  . Lancet Devices (ACCU-CHEK SOFTCLIX) lancets Use as instructed daily. 1 each 5  . mometasone-formoterol (DULERA) 100-5 MCG/ACT AERO Inhale 1 puff into the lungs daily. 1 Inhaler 2  . NIFEdipine (PROCARDIA XL) 60 MG 24 hr tablet Take 1 tablet (60 mg total) by mouth daily. 30 tablet 5  . nitroGLYCERIN (NITROSTAT) 0.4 MG SL tablet Place 1 tablet (0.4 mg total) under the tongue every 5 (five) minutes as needed for chest pain. 10 tablet 0  . sertraline (ZOLOFT) 50 MG tablet Take 1 tablet (50 mg total) by mouth daily. 90 tablet 0  . sodium bicarbonate 650 MG tablet Take 1 tablet (650 mg total) by mouth 2 (two) times daily. 180 tablet 0  . Nutritional Supplements  (FEEDING SUPPLEMENT, NEPRO CARB STEADY,) LIQD Take 237 mLs by mouth 2 (two) times daily between meals. (Patient not taking: Reported on 03/04/2016)     No facility-administered medications prior to visit.     ROS Review of Systems  Constitutional: Negative for activity change and appetite change.  HENT: Negative for sinus pressure and sore throat.   Respiratory: Negative for chest tightness, shortness of breath and wheezing.   Cardiovascular: Negative for chest pain and palpitations.  Gastrointestinal: Negative for abdominal distention, abdominal pain and constipation.  Genitourinary: Negative.   Musculoskeletal: Negative.   Psychiatric/Behavioral: Negative for behavioral problems and dysphoric mood.    Objective:  BP (!) 175/68 (BP Location: Right Arm, Patient Position: Sitting, Cuff Size: Small)   Pulse (!) 57   Temp 98  F (36.7 C) (Oral)   Ht 4\' 11"  (1.499 m)   Wt 124 lb 6.4 oz (56.4 kg)   SpO2 99%   BMI 25.13 kg/m   BP/Weight 03/04/2016 12/23/2015 [id number]  Systolic BP 0000000 [id number]  Diastolic BP 68 79 68  Wt. (Lbs) 124.4 122.2 134.8  BMI 25.13 24.68 27.21      Physical Exam  Constitutional: She is oriented to person, place, and time. She appears well-developed and well-nourished.  Cardiovascular: Normal rate, normal heart sounds and intact distal pulses.   No murmur heard. Pulmonary/Chest: Effort normal and breath sounds normal. She has no wheezes. She has no rales. She exhibits no tenderness.  Abdominal: Soft. Bowel sounds are normal. She exhibits no distension and no mass. There is no tenderness.  Musculoskeletal: Normal range of motion.  Neurological: She is alert and oriented to person, place, and time.    Lab Results  Component Value Date   HGBA1C 5.7 12/23/2015    Assessment & Plan:   1. Diabetes mellitus due to underlying condition with hyperosmolarity without coma, without long-term current use of insulin (HCC) Diet controlled with A1c of 5.7 - Glucose  (CBG)  2. CKD (chronic kidney disease), stage IV (HCC) Avoid nephrotoxins Currently management, [place] kidney - sodium bicarbonate 650 MG tablet; Take 1 tablet (650 mg total) by mouth 2 (two) times daily.  Dispense: 180 tablet; Refill: 1  3. Dementia without behavioral disturbance, unspecified dementia type Patient advised to write things down to help her memory Discussed Aricept will slow progression of dementia but that dementia is a progressive disease. Advised against driving and cooking - donepezil (ARICEPT) 10 MG tablet; Take 1 tablet (10 mg total) by mouth at bedtime.  Dispense: 90 tablet; Refill: 1  4. Essential hypertension Uncontrolled due to running out of nifedipine which I have refilled.   Meds ordered this encounter  Medications  . sodium bicarbonate 650 MG tablet    Sig: Take 1 tablet (650 mg total) by mouth 2 (two) times daily.    Dispense:  180 tablet    Refill:  1  . donepezil (ARICEPT) 10 MG tablet    Sig: Take 1 tablet (10 mg total) by mouth at bedtime.    Dispense:  90 tablet    Refill:  1    Follow-up: Return in about 1 month (around 04/04/2016) for Follow-up on hypertension.   Arnoldo Morale MD

## 2016-03-18 MED FILL — SODIUM BICARB 10 GRAIN TAB: 650 | 90 days supply | Qty: 180 | Fill #0

## 2016-03-22 ENCOUNTER — Other Ambulatory Visit: Payer: Self-pay | Admitting: Family Medicine

## 2016-03-22 DIAGNOSIS — I5032 Chronic diastolic (congestive) heart failure: Secondary | ICD-10-CM

## 2016-03-22 DIAGNOSIS — E7800 Pure hypercholesterolemia, unspecified: Secondary | ICD-10-CM

## 2016-03-22 DIAGNOSIS — I25119 Atherosclerotic heart disease of native coronary artery with unspecified angina pectoris: Secondary | ICD-10-CM

## 2016-03-22 DIAGNOSIS — F341 Dysthymic disorder: Secondary | ICD-10-CM

## 2016-03-24 DIAGNOSIS — J449 Chronic obstructive pulmonary disease, unspecified: Secondary | ICD-10-CM | POA: Diagnosis not present

## 2016-04-14 DIAGNOSIS — I701 Atherosclerosis of renal artery: Secondary | ICD-10-CM | POA: Diagnosis not present

## 2016-04-14 DIAGNOSIS — D631 Anemia in chronic kidney disease: Secondary | ICD-10-CM | POA: Diagnosis not present

## 2016-04-14 DIAGNOSIS — E8770 Fluid overload, unspecified: Secondary | ICD-10-CM | POA: Diagnosis not present

## 2016-04-14 DIAGNOSIS — N179 Acute kidney failure, unspecified: Secondary | ICD-10-CM | POA: Diagnosis not present

## 2016-04-14 DIAGNOSIS — I10 Essential (primary) hypertension: Secondary | ICD-10-CM | POA: Diagnosis not present

## 2016-04-21 DIAGNOSIS — J449 Chronic obstructive pulmonary disease, unspecified: Secondary | ICD-10-CM | POA: Diagnosis not present

## 2016-05-02 IMAGING — DX DG CHEST 2V
2 series · 2 of 2 positions shown · non-contrast
Comparison: December 24, 2014

CLINICAL DATA: Chest pain on the left side starting today.

EXAM:
CHEST  2 VIEW

[chest pa]
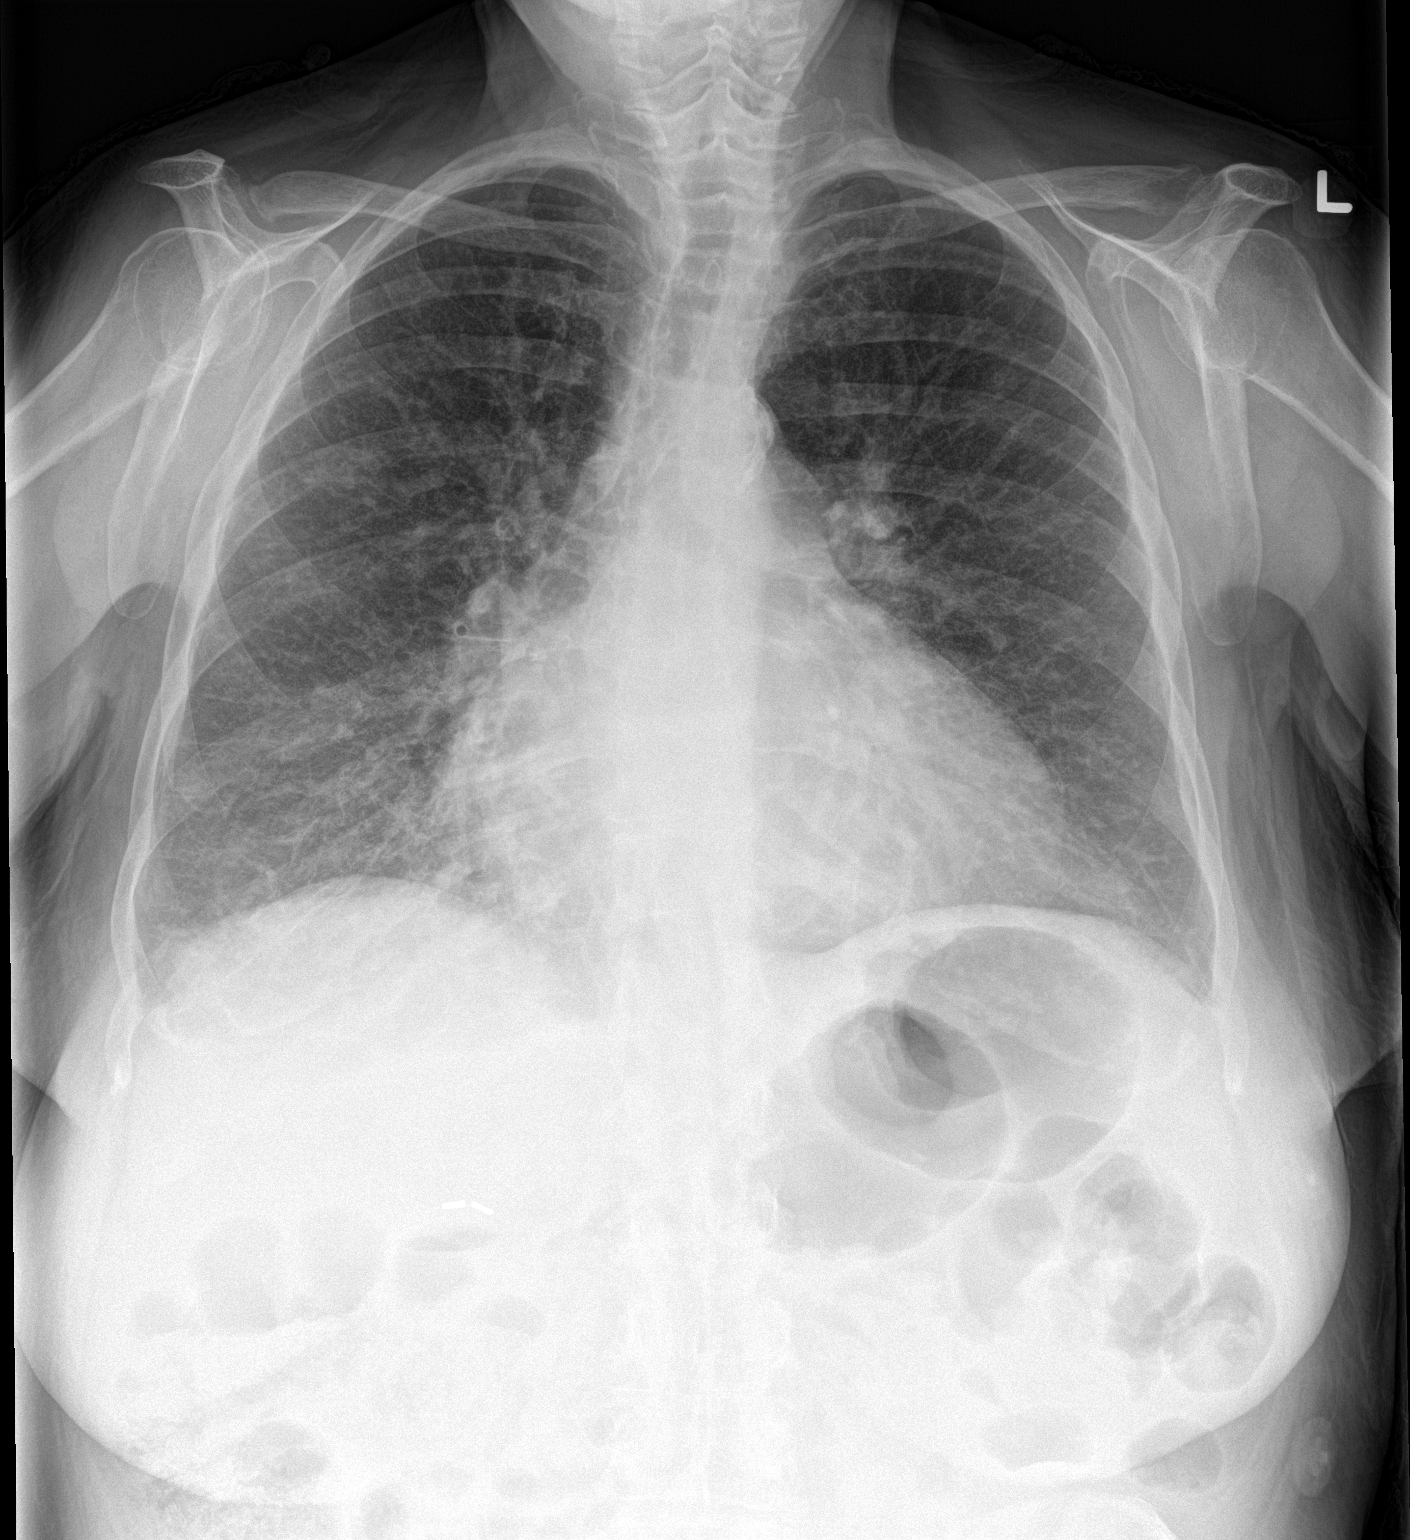

[chest lat]
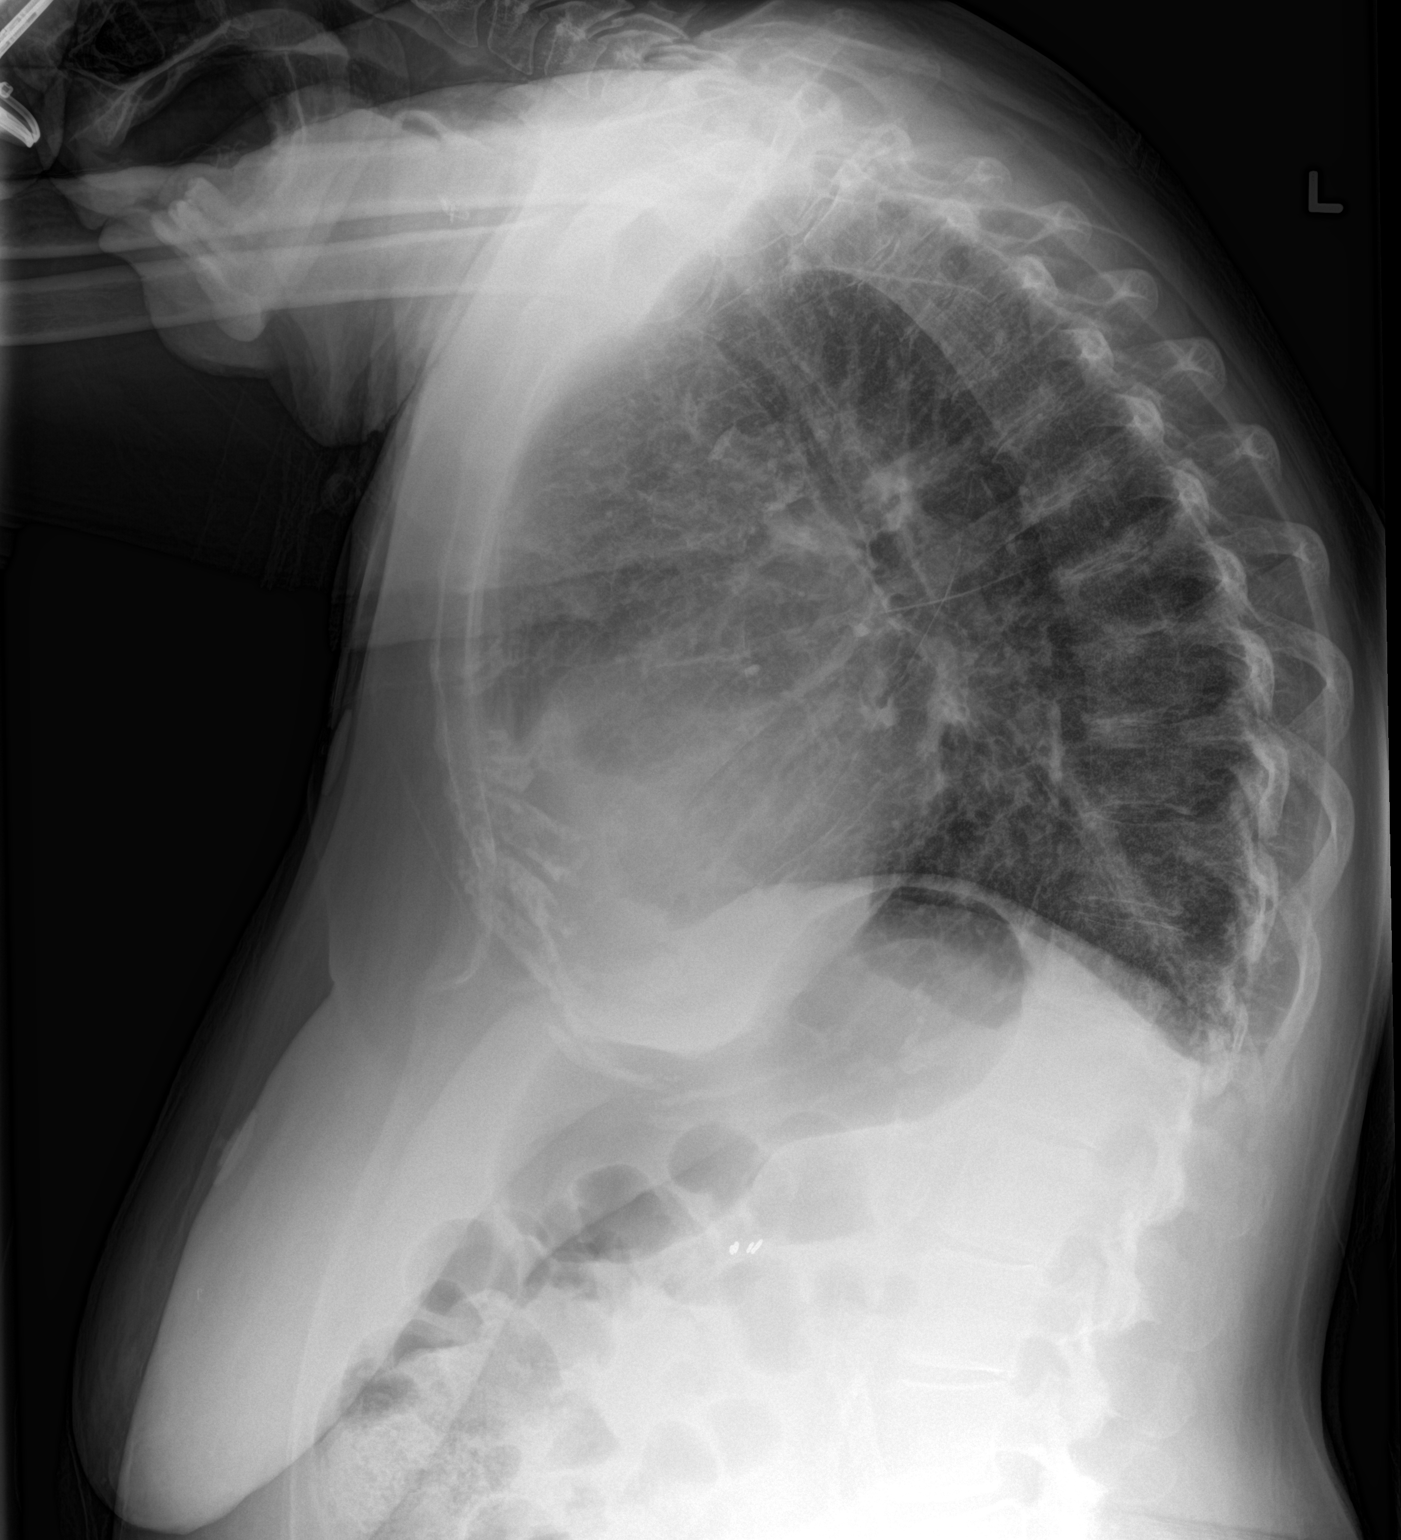

[2 of 2 positions shown; findings below may reference images not displayed]

FINDINGS: The mediastinal contour is normal. The heart size is enlarged. There
is diffuse increased pulmonary interstitium. There is no focal
pneumonia or pleural effusion. The lungs are hyperinflated. No acute
abnormalities identified within the visualized bones. There is
scoliosis of spine.
IMPRESSION: Mild interstitial edema.

## 2016-05-03 ENCOUNTER — Other Ambulatory Visit: Payer: Self-pay | Admitting: Family Medicine

## 2016-05-03 DIAGNOSIS — N184 Chronic kidney disease, stage 4 (severe): Secondary | ICD-10-CM

## 2016-05-16 ENCOUNTER — Other Ambulatory Visit: Payer: Self-pay | Admitting: Family Medicine

## 2016-05-16 DIAGNOSIS — I10 Essential (primary) hypertension: Secondary | ICD-10-CM

## 2016-05-22 DIAGNOSIS — J449 Chronic obstructive pulmonary disease, unspecified: Secondary | ICD-10-CM | POA: Diagnosis not present

## 2016-05-25 ENCOUNTER — Inpatient Hospital Stay: Admission: RE | Admit: 2016-05-25 | Payer: PPO | Source: Ambulatory Visit

## 2016-06-01 ENCOUNTER — Ambulatory Visit (INDEPENDENT_AMBULATORY_CARE_PROVIDER_SITE_OTHER)
Admission: RE | Admit: 2016-06-01 | Discharge: 2016-06-01 | Disposition: A | Discharge status: Home / Self Care | Payer: Medicare Other | Source: Ambulatory Visit | Attending: Pulmonary Disease | Admitting: Pulmonary Disease

## 2016-06-01 DIAGNOSIS — J189 Pneumonia, unspecified organism: Secondary | ICD-10-CM

## 2016-06-01 DIAGNOSIS — J438 Other emphysema: Secondary | ICD-10-CM

## 2016-06-01 DIAGNOSIS — J449 Chronic obstructive pulmonary disease, unspecified: Secondary | ICD-10-CM | POA: Diagnosis not present

## 2016-06-15 ENCOUNTER — Ambulatory Visit: Payer: Medicare Other | Attending: Family Medicine | Admitting: Family Medicine

## 2016-06-15 ENCOUNTER — Encounter: Payer: Self-pay | Admitting: Family Medicine

## 2016-06-15 VITALS — BP 122/70 | HR 67 | Temp 97.4°F | Wt 128.0 lb

## 2016-06-15 DIAGNOSIS — I10 Essential (primary) hypertension: Secondary | ICD-10-CM | POA: Diagnosis not present

## 2016-06-15 DIAGNOSIS — F341 Dysthymic disorder: Secondary | ICD-10-CM | POA: Diagnosis not present

## 2016-06-15 DIAGNOSIS — F418 Other specified anxiety disorders: Secondary | ICD-10-CM | POA: Diagnosis not present

## 2016-06-15 DIAGNOSIS — I130 Hypertensive heart and chronic kidney disease with heart failure and stage 1 through stage 4 chronic kidney disease, or unspecified chronic kidney disease: Secondary | ICD-10-CM | POA: Insufficient documentation

## 2016-06-15 DIAGNOSIS — K529 Noninfective gastroenteritis and colitis, unspecified: Secondary | ICD-10-CM | POA: Diagnosis not present

## 2016-06-15 DIAGNOSIS — E7800 Pure hypercholesterolemia, unspecified: Secondary | ICD-10-CM | POA: Diagnosis not present

## 2016-06-15 DIAGNOSIS — I5032 Chronic diastolic (congestive) heart failure: Secondary | ICD-10-CM | POA: Diagnosis not present

## 2016-06-15 DIAGNOSIS — N184 Chronic kidney disease, stage 4 (severe): Secondary | ICD-10-CM | POA: Diagnosis not present

## 2016-06-15 DIAGNOSIS — E0800 Diabetes mellitus due to underlying condition with hyperosmolarity without nonketotic hyperglycemic-hyperosmolar coma (NKHHC): Secondary | ICD-10-CM | POA: Diagnosis not present

## 2016-06-15 DIAGNOSIS — E1122 Type 2 diabetes mellitus with diabetic chronic kidney disease: Secondary | ICD-10-CM | POA: Diagnosis not present

## 2016-06-15 DIAGNOSIS — E1151 Type 2 diabetes mellitus with diabetic peripheral angiopathy without gangrene: Secondary | ICD-10-CM | POA: Insufficient documentation

## 2016-06-15 DIAGNOSIS — I25119 Atherosclerotic heart disease of native coronary artery with unspecified angina pectoris: Secondary | ICD-10-CM | POA: Diagnosis not present

## 2016-06-15 DIAGNOSIS — Z79899 Other long term (current) drug therapy: Secondary | ICD-10-CM | POA: Diagnosis not present

## 2016-06-15 DIAGNOSIS — F0390 Unspecified dementia without behavioral disturbance: Secondary | ICD-10-CM | POA: Diagnosis not present

## 2016-06-15 DIAGNOSIS — J449 Chronic obstructive pulmonary disease, unspecified: Secondary | ICD-10-CM | POA: Diagnosis not present

## 2016-06-15 DIAGNOSIS — Z7902 Long term (current) use of antithrombotics/antiplatelets: Secondary | ICD-10-CM | POA: Insufficient documentation

## 2016-06-15 LAB — GLUCOSE, POCT (MANUAL RESULT ENTRY): POC GLUCOSE: 176 mg/dL — AB (ref 70–99)

## 2016-06-15 LAB — POCT GLYCOSYLATED HEMOGLOBIN (HGB A1C): HEMOGLOBIN A1C: 6.1

## 2016-06-15 MED ORDER — FERROUS SULFATE 325 (65 FE) MG PO TABS: 325.0000 mg | ORAL_TABLET | Freq: Two times a day (BID) | ORAL | 1 refills | Status: AC

## 2016-06-15 MED ORDER — MOMETASONE FURO-FORMOTEROL FUM 100-5 MCG/ACT IN AERO: 1.0000 | INHALATION_SPRAY | Freq: Every day | RESPIRATORY_TRACT | 3 refills | Status: DC

## 2016-06-15 MED ORDER — ATORVASTATIN CALCIUM 80 MG PO TABS: ORAL_TABLET | ORAL | 1 refills | Status: DC

## 2016-06-15 MED ORDER — ISOSORBIDE MONONITRATE ER 30 MG PO TB24: 30.0000 mg | ORAL_TABLET | Freq: Every day | ORAL | 1 refills | Status: DC

## 2016-06-15 MED ORDER — SODIUM BICARBONATE 650 MG PO TABS: 650.0000 mg | ORAL_TABLET | Freq: Two times a day (BID) | ORAL | 1 refills | Status: DC

## 2016-06-15 MED ORDER — PROMETHAZINE HCL 25 MG PO TABS: 25.0000 mg | ORAL_TABLET | Freq: Three times a day (TID) | ORAL | 0 refills | Status: DC | PRN

## 2016-06-15 MED ORDER — FUROSEMIDE 80 MG PO TABS: 80.0000 mg | ORAL_TABLET | Freq: Two times a day (BID) | ORAL | 1 refills | Status: DC

## 2016-06-15 MED ORDER — CALCITRIOL 0.5 MCG PO CAPS: 0.5000 ug | ORAL_CAPSULE | Freq: Every day | ORAL | 1 refills | Status: DC

## 2016-06-15 MED ORDER — ALBUTEROL SULFATE HFA 108 (90 BASE) MCG/ACT IN AERS: 2.0000 | INHALATION_SPRAY | Freq: Four times a day (QID) | RESPIRATORY_TRACT | 3 refills | Status: DC | PRN

## 2016-06-15 MED ORDER — SERTRALINE HCL 50 MG PO TABS: 50.0000 mg | ORAL_TABLET | Freq: Every day | ORAL | 1 refills | Status: DC

## 2016-06-15 MED ORDER — NIFEDIPINE ER OSMOTIC RELEASE 60 MG PO TB24: 60.0000 mg | ORAL_TABLET | Freq: Every day | ORAL | 5 refills | Status: DC

## 2016-06-15 MED ORDER — DONEPEZIL HCL 10 MG PO TABS: 10.0000 mg | ORAL_TABLET | Freq: Every day | ORAL | 1 refills | Status: AC

## 2016-06-15 MED ORDER — ONDANSETRON HCL 4 MG/2ML IJ SOLN
4.0000 mg | Freq: Once | INTRAMUSCULAR | Status: AC
  Administered 2016-06-15: 4 mg via INTRAMUSCULAR

## 2016-06-15 MED ORDER — CLOPIDOGREL BISULFATE 75 MG PO TABS: 75.0000 mg | ORAL_TABLET | Freq: Every day | ORAL | 1 refills | Status: DC

## 2016-06-15 MED ORDER — CARVEDILOL 3.125 MG PO TABS: ORAL_TABLET | ORAL | 1 refills | Status: DC

## 2016-06-15 NOTE — Progress Notes (Signed)
Subjective:  Patient ID: [name], female    DOB: Jun 12, 1950  Age: 66 y.o. MRN: [id number]  CC: Diabetes   HPI [name] is a 66 -year-old female with a history of diet-controlled type 2 diabetes mellitus (A1c 6.1), chronic kidney disease stage IV, hypertension, diastolic CHF (EF 72-09%OBSJ 03/2015) who comes in today for follow-up visit.  Her chronic kidney disease is managed by [facility] and she endorses compliance with her medications.  She complains of a one-week history of diarrhea, vomiting, reduced appetite which have improved over the last 2 days. Denies history of sick contacts or recent ingestion of fast foods. She has not had any fever. Endorses some abdominal cramping.  She was commenced on Aricept at her last visit due to concerns for dementia; she is accompanied by her husband today. She was previously on oxygen but no longer needs it. Denies shortness of breath, chest pain.  Past Medical History:  Diagnosis Date  . Anemia, chronic disease 05/16/2008   Qualifier: Diagnosis of  By: Percival Spanish, MD, Farrel Gordon    . Bradycardia    a. Limiting BB titration.  [name] CAD (coronary artery disease)   . Carotid artery disease ([place])    a. Duplex 12/2014: left CEA patent with elevated velocities, 60-79% RICA (stable over serial exams), 62-83% LICA stenosis (stable over serial exams), 50% LECA, elevated velocities in bilateral subclavian arteries.  . Chronic diastolic CHF (congestive heart failure) ([place])   . CKD (chronic kidney disease), stage III   . COPD (chronic obstructive pulmonary disease) (HCC)    home oxygen  . Dementia   . Depression   . Diabetes mellitus   . Diabetic retinopathy ([place])   . Essential hypertension   . GI AVM (gastrointestinal arteriovenous vascular malformation)    small bowel  . Hyperlipidemia   . Hypertensive heart disease with congestive heart failure ([place])   . Iron deficiency anemia   . Peripheral vascular disease ([place])    a.s /p left  common iliac and external iliac artery stents. b. H/o subclavian, mesenteric, and celiac artery stenosis (see below).  . Protein-calorie malnutrition ([place]) 01/24/2015  . Renal artery stenosis ([place])    a. Duplex 2014: no obvious evidence of hemodynamically significant stenosis >60%, bilateral intrarenal arteries exhibit absent diastolic flow. There is evidence of elevated velocities of the mid aorta. 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  . Tobacco abuse   . 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 APPENDECTOMY    . CARDIAC CATHETERIZATION N/A 01/23/2015   Procedure: Left Heart Cath and Coronary Angiography;  Surgeon: [name], MD;  Location: [facility];  Service: Cardiovascular;  Laterality: N/A;  . CAROTID ENDARTERECTOMY    . CESAREAN SECTION    . CHOLECYSTECTOMY    . ENTEROSCOPY  01/26/2012   Procedure: ENTEROSCOPY;  Surgeon: Gatha Mayer, MD;  Location: [facility];  Service: Endoscopy;  Laterality: N/A;  . ENTEROSCOPY N/A 03/25/2015   Procedure: ENTEROSCOPY;  Surgeon: Doran Stabler, MD;  Location: [facility];  Service: Endoscopy;  Laterality: N/A;  . ILIAC ARTERY STENT     left  . SHOULDER ARTHROSCOPY    . TONSILLECTOMY      No Known Allergies  . Outpatient Medications Prior to Visit  Medication Sig Dispense Refill  . Blood Glucose Monitoring Suppl (TRUE METRIX AIR GLUCOSE METER) DEVI 1 Device  by Does not apply route 4 (four) times daily -  before meals and at bedtime. 1 Device 0  . calcium carbonate (OS-CAL - DOSED IN MG OF ELEMENTAL CALCIUM) 1250 (500 Ca) MG tablet Take 1 tablet (500 mg of elemental calcium total) by mouth 3 (three) times daily with meals. 90 tablet 0  . collagenase (SANTYL) ointment Apply topically daily. Apply to right side of gluteal cleft Unstageable Pressure Injury once daily and PRN soiling. 15 g 0  . fish oil-omega-3  fatty acids 1000 MG capsule Take 1 g by mouth 3 (three) times daily. Reported on 04/09/2015    . glucose blood (TRUE METRIX BLOOD GLUCOSE TEST) test strip Use as instructed 100 each 12  . hydrOXYzine (ATARAX/VISTARIL) 25 MG tablet Take 1 tablet (25 mg total) by mouth 3 (three) times daily as needed. 60 tablet 2  . Lancet Devices (ACCU-CHEK SOFTCLIX) lancets Use as instructed daily. 1 each 5  . nitroGLYCERIN (NITROSTAT) 0.4 MG SL tablet Place 1 tablet (0.4 mg total) under the tongue every 5 (five) minutes as needed for chest pain. 10 tablet 0  . albuterol (PROVENTIL HFA;VENTOLIN HFA) 108 (90 Base) MCG/ACT inhaler Inhale 2 puffs into the lungs every 6 (six) hours as needed for wheezing or shortness of breath. 1 Inhaler 2  . atorvastatin (LIPITOR) 80 MG tablet TAKE 1 TABLET BY MOUTH  DAILY AT 6 PM. 90 tablet 0  . calcitRIOL (ROCALTROL) 0.5 MCG capsule TAKE 1 CAPSULE BY MOUTH  DAILY 90 capsule 0  . carvedilol (COREG) 3.125 MG tablet TAKE 1 TABLET BY MOUTH TWO  TIMES DAILY WITH A MEAL 180 tablet 1  . clopidogrel (PLAVIX) 75 MG tablet TAKE 1 TABLET BY MOUTH  DAILY 90 tablet 0  . donepezil (ARICEPT) 10 MG tablet Take 1 tablet (10 mg total) by mouth at bedtime. 90 tablet 1  . ferrous sulfate 325 (65 FE) MG tablet Take 1 tablet (325 mg total) by mouth 3 (three) times daily. 270 tablet 0  . furosemide (LASIX) 80 MG tablet TAKE 1 TABLET BY MOUTH TWO  TIMES DAILY 180 tablet 0  . isosorbide mononitrate (IMDUR) 30 MG 24 hr tablet TAKE 1 TABLET BY MOUTH  DAILY 90 tablet 0  . mometasone-formoterol (DULERA) 100-5 MCG/ACT AERO Inhale 1 puff into the lungs daily. 1 Inhaler 2  . NIFEdipine (PROCARDIA XL) 60 MG 24 hr tablet Take 1 tablet (60 mg total) by mouth daily. 30 tablet 5  . sertraline (ZOLOFT) 50 MG tablet TAKE 1 TABLET BY MOUTH  DAILY 90 tablet 0  . Nutritional Supplements (FEEDING SUPPLEMENT, NEPRO CARB STEADY,) LIQD Take 237 mLs by mouth 2 (two) times daily between meals. (Patient not taking: Reported on  03/04/2016)    . sodium bicarbonate 650 MG tablet Take 1 tablet (650 mg total) by mouth 2 (two) times daily. 180 tablet 1   No facility-administered medications prior to visit.     ROS Review of Systems  Constitutional: Positive for appetite change and fatigue. Negative for activity change.  HENT: Negative for congestion, sinus pressure and sore throat.   Eyes: Negative for visual disturbance.  Respiratory: Negative for cough, chest tightness, shortness of breath and wheezing.   Cardiovascular: Negative for chest pain and palpitations.  Gastrointestinal:       See hpi  Endocrine: Negative for polydipsia.  Genitourinary: Negative for dysuria and frequency.  Musculoskeletal: Negative for arthralgias and back pain.  Skin: Negative for rash.  Neurological: Negative for tremors, light-headedness and numbness.  Hematological:  Does not bruise/bleed easily.  Psychiatric/Behavioral: Negative for agitation and behavioral problems.    Objective:  BP 122/70   Pulse 67   Temp 97.4 F (36.3 C) (Oral)   Wt 128 lb (58.1 kg)   SpO2 98%   BMI 25.85 kg/m   BP/Weight 06/15/2016 03/04/2016 99/09/3380  Systolic BP 505 397 673  Diastolic BP 70 68 79  Wt. (Lbs) 128 124.4 122.2  BMI 25.85 25.13 24.68      Physical Exam  Constitutional: She is oriented to person, place, and time. She appears well-developed and well-nourished.  Neck: No JVD present.  Cardiovascular: Normal rate, normal heart sounds and intact distal pulses.   No murmur heard. Pulmonary/Chest: Effort normal and breath sounds normal. She has no wheezes. She has no rales. She exhibits no tenderness.  Abdominal: Soft. Bowel sounds are normal. She exhibits no distension and no mass. There is no tenderness.  Musculoskeletal: Normal range of motion.  Neurological: She is alert and oriented to person, place, and time.  Skin: Skin is warm and dry.  Psychiatric: She has a normal mood and affect.     Lab Results  Component Value Date    HGBA1C 6.1 06/15/2016    Assessment & Plan:   1. Diabetes mellitus due to underlying condition with hyperosmolarity without coma, without long-term current use of insulin (HCC) Date controlled with A1c of 6.1 - POCT glucose (manual entry) - POCT glycosylated hemoglobin (Hb A1C)  2. CKD (chronic kidney disease), stage IV (HCC) Stable Follow-up with [facility] - sodium bicarbonate 650 MG tablet; Take 1 tablet (650 mg total) by mouth 2 (two) times daily.  Dispense: 180 tablet; Refill: 1 - ferrous sulfate 325 (65 FE) MG tablet; Take 1 tablet (325 mg total) by mouth 2 (two) times daily with a meal.  Dispense: 180 tablet; Refill: 1 - calcitRIOL (ROCALTROL) 0.5 MCG capsule; Take 1 capsule (0.5 mcg total) by mouth daily.  Dispense: 90 capsule; Refill: 1  3. ANXIETY DEPRESSION Controlled - sertraline (ZOLOFT) 50 MG tablet; Take 1 tablet (50 mg total) by mouth daily.  Dispense: 90 tablet; Refill: 1  4. Chronic diastolic CHF (congestive heart failure) (HCC) Euvolemic - isosorbide mononitrate (IMDUR) 30 MG 24 hr tablet; Take 1 tablet (30 mg total) by mouth daily.  Dispense: 90 tablet; Refill: 1  5. Chronic obstructive pulmonary disease, unspecified COPD type ([place]) Stable - mometasone-formoterol (DULERA) 100-5 MCG/ACT AERO; Inhale 1 puff into the lungs daily.  Dispense: 1 Inhaler; Refill: 3 - albuterol (PROVENTIL HFA;VENTOLIN HFA) 108 (90 Base) MCG/ACT inhaler; Inhale 2 puffs into the lungs every 6 (six) hours as needed for wheezing or shortness of breath.  Dispense: 1 Inhaler; Refill: 3  6. Pure hypercholesterolemia Uncontrolled Low-cholesterol diet. - atorvastatin (LIPITOR) 80 MG tablet; TAKE 1 TABLET BY MOUTH  DAILY AT 6 PM.  Dispense: 90 tablet; Refill: 1  7. Essential hypertension Controlled - NIFEdipine (PROCARDIA XL) 60 MG 24 hr tablet; Take 1 tablet (60 mg total) by mouth daily.  Dispense: 30 tablet; Refill: 5 - carvedilol (COREG) 3.125 MG tablet; TAKE 1 TABLET BY  MOUTH TWO  TIMES DAILY WITH A MEAL  Dispense: 180 tablet; Refill: 1  8. Coronary artery disease involving native coronary artery of native heart with angina pectoris (HCC) Stable Risk factor modification - clopidogrel (PLAVIX) 75 MG tablet; Take 1 tablet (75 mg total) by mouth daily.  Dispense: 90 tablet; Refill: 1  9. Dementia without behavioral disturbance, unspecified dementia type Stable - donepezil (ARICEPT) 10  MG tablet; Take 1 tablet (10 mg total) by mouth at bedtime.  Dispense: 90 tablet; Refill: 1  10. Gastroenteritis Improving IM Zofran administered in the clinic - promethazine (PHENERGAN) 25 MG tablet; Take 1 tablet (25 mg total) by mouth every 8 (eight) hours as needed for nausea or vomiting.  Dispense: 20 tablet; Refill: 0   Meds ordered this encounter  Medications  . promethazine (PHENERGAN) 25 MG tablet    Sig: Take 1 tablet (25 mg total) by mouth every 8 (eight) hours as needed for nausea or vomiting.    Dispense:  20 tablet    Refill:  0  . sodium bicarbonate 650 MG tablet    Sig: Take 1 tablet (650 mg total) by mouth 2 (two) times daily.    Dispense:  180 tablet    Refill:  1  . sertraline (ZOLOFT) 50 MG tablet    Sig: Take 1 tablet (50 mg total) by mouth daily.    Dispense:  90 tablet    Refill:  1  . NIFEdipine (PROCARDIA XL) 60 MG 24 hr tablet    Sig: Take 1 tablet (60 mg total) by mouth daily.    Dispense:  30 tablet    Refill:  5  . mometasone-formoterol (DULERA) 100-5 MCG/ACT AERO    Sig: Inhale 1 puff into the lungs daily.    Dispense:  1 Inhaler    Refill:  3  . isosorbide mononitrate (IMDUR) 30 MG 24 hr tablet    Sig: Take 1 tablet (30 mg total) by mouth daily.    Dispense:  90 tablet    Refill:  1  . furosemide (LASIX) 80 MG tablet    Sig: Take 1 tablet (80 mg total) by mouth 2 (two) times daily.    Dispense:  180 tablet    Refill:  1  . ferrous sulfate 325 (65 FE) MG tablet    Sig: Take 1 tablet (325 mg total) by mouth 2 (two) times daily  with a meal.    Dispense:  180 tablet    Refill:  1  . albuterol (PROVENTIL HFA;VENTOLIN HFA) 108 (90 Base) MCG/ACT inhaler    Sig: Inhale 2 puffs into the lungs every 6 (six) hours as needed for wheezing or shortness of breath.    Dispense:  1 Inhaler    Refill:  3  . atorvastatin (LIPITOR) 80 MG tablet    Sig: TAKE 1 TABLET BY MOUTH  DAILY AT 6 PM.    Dispense:  90 tablet    Refill:  1  . calcitRIOL (ROCALTROL) 0.5 MCG capsule    Sig: Take 1 capsule (0.5 mcg total) by mouth daily.    Dispense:  90 capsule    Refill:  1  . carvedilol (COREG) 3.125 MG tablet    Sig: TAKE 1 TABLET BY MOUTH TWO  TIMES DAILY WITH A MEAL    Dispense:  180 tablet    Refill:  1  . clopidogrel (PLAVIX) 75 MG tablet    Sig: Take 1 tablet (75 mg total) by mouth daily.    Dispense:  90 tablet    Refill:  1  . donepezil (ARICEPT) 10 MG tablet    Sig: Take 1 tablet (10 mg total) by mouth at bedtime.    Dispense:  90 tablet    Refill:  1    Follow-up: Return in about 1 month (around 07/15/2016) for complete physical exam.   Arnoldo Morale MD

## 2016-06-21 ENCOUNTER — Inpatient Hospital Stay (HOSPITAL_COMMUNITY): Payer: Medicare Other

## 2016-06-21 ENCOUNTER — Inpatient Hospital Stay (HOSPITAL_COMMUNITY)
Admission: EM | Admit: 2016-06-21 | Discharge: 2016-06-24 | DRG: 377 | Disposition: A | Discharge status: Home Health | Payer: Medicare Other | Attending: Family Medicine | Admitting: Family Medicine

## 2016-06-21 ENCOUNTER — Encounter (HOSPITAL_COMMUNITY): Payer: Self-pay | Admitting: Emergency Medicine

## 2016-06-21 ENCOUNTER — Emergency Department (HOSPITAL_COMMUNITY): Payer: Medicare Other

## 2016-06-21 DIAGNOSIS — I129 Hypertensive chronic kidney disease with stage 1 through stage 4 chronic kidney disease, or unspecified chronic kidney disease: Secondary | ICD-10-CM | POA: Diagnosis not present

## 2016-06-21 DIAGNOSIS — I959 Hypotension, unspecified: Secondary | ICD-10-CM | POA: Diagnosis not present

## 2016-06-21 DIAGNOSIS — R740 Nonspecific elevation of levels of transaminase and lactic acid dehydrogenase [LDH]: Secondary | ICD-10-CM | POA: Diagnosis not present

## 2016-06-21 DIAGNOSIS — E1151 Type 2 diabetes mellitus with diabetic peripheral angiopathy without gangrene: Secondary | ICD-10-CM | POA: Diagnosis present

## 2016-06-21 DIAGNOSIS — E46 Unspecified protein-calorie malnutrition: Secondary | ICD-10-CM | POA: Diagnosis not present

## 2016-06-21 DIAGNOSIS — K210 Gastro-esophageal reflux disease with esophagitis: Secondary | ICD-10-CM | POA: Diagnosis present

## 2016-06-21 DIAGNOSIS — N179 Acute kidney failure, unspecified: Secondary | ICD-10-CM | POA: Diagnosis not present

## 2016-06-21 DIAGNOSIS — Z7982 Long term (current) use of aspirin: Secondary | ICD-10-CM | POA: Diagnosis not present

## 2016-06-21 DIAGNOSIS — J449 Chronic obstructive pulmonary disease, unspecified: Secondary | ICD-10-CM | POA: Diagnosis present

## 2016-06-21 DIAGNOSIS — K2210 Ulcer of esophagus without bleeding: Secondary | ICD-10-CM | POA: Diagnosis not present

## 2016-06-21 DIAGNOSIS — E11319 Type 2 diabetes mellitus with unspecified diabetic retinopathy without macular edema: Secondary | ICD-10-CM | POA: Diagnosis present

## 2016-06-21 DIAGNOSIS — K319 Disease of stomach and duodenum, unspecified: Secondary | ICD-10-CM | POA: Diagnosis present

## 2016-06-21 DIAGNOSIS — R195 Other fecal abnormalities: Secondary | ICD-10-CM

## 2016-06-21 DIAGNOSIS — D631 Anemia in chronic kidney disease: Secondary | ICD-10-CM | POA: Diagnosis present

## 2016-06-21 DIAGNOSIS — K760 Fatty (change of) liver, not elsewhere classified: Secondary | ICD-10-CM | POA: Diagnosis not present

## 2016-06-21 DIAGNOSIS — N184 Chronic kidney disease, stage 4 (severe): Secondary | ICD-10-CM

## 2016-06-21 DIAGNOSIS — N189 Chronic kidney disease, unspecified: Secondary | ICD-10-CM | POA: Diagnosis not present

## 2016-06-21 DIAGNOSIS — R112 Nausea with vomiting, unspecified: Secondary | ICD-10-CM | POA: Diagnosis not present

## 2016-06-21 DIAGNOSIS — I248 Other forms of acute ischemic heart disease: Secondary | ICD-10-CM | POA: Diagnosis present

## 2016-06-21 DIAGNOSIS — Z8673 Personal history of transient ischemic attack (TIA), and cerebral infarction without residual deficits: Secondary | ICD-10-CM | POA: Diagnosis not present

## 2016-06-21 DIAGNOSIS — K31819 Angiodysplasia of stomach and duodenum without bleeding: Secondary | ICD-10-CM

## 2016-06-21 DIAGNOSIS — F17210 Nicotine dependence, cigarettes, uncomplicated: Secondary | ICD-10-CM | POA: Diagnosis present

## 2016-06-21 DIAGNOSIS — G3183 Dementia with Lewy bodies: Secondary | ICD-10-CM | POA: Diagnosis present

## 2016-06-21 DIAGNOSIS — I340 Nonrheumatic mitral (valve) insufficiency: Secondary | ICD-10-CM | POA: Diagnosis present

## 2016-06-21 DIAGNOSIS — R945 Abnormal results of liver function studies: Secondary | ICD-10-CM | POA: Diagnosis not present

## 2016-06-21 DIAGNOSIS — Z66 Do not resuscitate: Secondary | ICD-10-CM | POA: Diagnosis not present

## 2016-06-21 DIAGNOSIS — K209 Esophagitis, unspecified without bleeding: Secondary | ICD-10-CM

## 2016-06-21 DIAGNOSIS — K7200 Acute and subacute hepatic failure without coma: Secondary | ICD-10-CM | POA: Diagnosis present

## 2016-06-21 DIAGNOSIS — I5043 Acute on chronic combined systolic (congestive) and diastolic (congestive) heart failure: Secondary | ICD-10-CM

## 2016-06-21 DIAGNOSIS — R7401 Elevation of levels of liver transaminase levels: Secondary | ICD-10-CM | POA: Diagnosis present

## 2016-06-21 DIAGNOSIS — D509 Iron deficiency anemia, unspecified: Secondary | ICD-10-CM | POA: Diagnosis not present

## 2016-06-21 DIAGNOSIS — E860 Dehydration: Secondary | ICD-10-CM | POA: Diagnosis present

## 2016-06-21 DIAGNOSIS — D649 Anemia, unspecified: Secondary | ICD-10-CM

## 2016-06-21 DIAGNOSIS — K920 Hematemesis: Secondary | ICD-10-CM | POA: Diagnosis not present

## 2016-06-21 DIAGNOSIS — R0602 Shortness of breath: Secondary | ICD-10-CM | POA: Diagnosis not present

## 2016-06-21 DIAGNOSIS — K921 Melena: Secondary | ICD-10-CM

## 2016-06-21 DIAGNOSIS — I272 Pulmonary hypertension, unspecified: Secondary | ICD-10-CM | POA: Diagnosis present

## 2016-06-21 DIAGNOSIS — D696 Thrombocytopenia, unspecified: Secondary | ICD-10-CM | POA: Diagnosis present

## 2016-06-21 DIAGNOSIS — I252 Old myocardial infarction: Secondary | ICD-10-CM

## 2016-06-21 DIAGNOSIS — Z7902 Long term (current) use of antithrombotics/antiplatelets: Secondary | ICD-10-CM

## 2016-06-21 DIAGNOSIS — Z9981 Dependence on supplemental oxygen: Secondary | ICD-10-CM | POA: Diagnosis not present

## 2016-06-21 DIAGNOSIS — I509 Heart failure, unspecified: Secondary | ICD-10-CM | POA: Diagnosis not present

## 2016-06-21 DIAGNOSIS — R590 Localized enlarged lymph nodes: Secondary | ICD-10-CM | POA: Diagnosis present

## 2016-06-21 DIAGNOSIS — Z7951 Long term (current) use of inhaled steroids: Secondary | ICD-10-CM

## 2016-06-21 DIAGNOSIS — F0390 Unspecified dementia without behavioral disturbance: Secondary | ICD-10-CM | POA: Diagnosis present

## 2016-06-21 DIAGNOSIS — E1122 Type 2 diabetes mellitus with diabetic chronic kidney disease: Secondary | ICD-10-CM | POA: Diagnosis present

## 2016-06-21 DIAGNOSIS — I2489 Other forms of acute ischemic heart disease: Secondary | ICD-10-CM

## 2016-06-21 DIAGNOSIS — I2510 Atherosclerotic heart disease of native coronary artery without angina pectoris: Secondary | ICD-10-CM | POA: Diagnosis present

## 2016-06-21 DIAGNOSIS — R748 Abnormal levels of other serum enzymes: Secondary | ICD-10-CM

## 2016-06-21 DIAGNOSIS — I130 Hypertensive heart and chronic kidney disease with heart failure and stage 1 through stage 4 chronic kidney disease, or unspecified chronic kidney disease: Secondary | ICD-10-CM | POA: Diagnosis present

## 2016-06-21 DIAGNOSIS — Z8249 Family history of ischemic heart disease and other diseases of the circulatory system: Secondary | ICD-10-CM

## 2016-06-21 DIAGNOSIS — K449 Diaphragmatic hernia without obstruction or gangrene: Secondary | ICD-10-CM | POA: Diagnosis not present

## 2016-06-21 DIAGNOSIS — K5790 Diverticulosis of intestine, part unspecified, without perforation or abscess without bleeding: Secondary | ICD-10-CM | POA: Diagnosis present

## 2016-06-21 DIAGNOSIS — K257 Chronic gastric ulcer without hemorrhage or perforation: Secondary | ICD-10-CM | POA: Diagnosis present

## 2016-06-21 DIAGNOSIS — E876 Hypokalemia: Secondary | ICD-10-CM

## 2016-06-21 DIAGNOSIS — E785 Hyperlipidemia, unspecified: Secondary | ICD-10-CM | POA: Diagnosis not present

## 2016-06-21 DIAGNOSIS — F0280 Dementia in other diseases classified elsewhere without behavioral disturbance: Secondary | ICD-10-CM | POA: Diagnosis present

## 2016-06-21 DIAGNOSIS — I70209 Unspecified atherosclerosis of native arteries of extremities, unspecified extremity: Secondary | ICD-10-CM | POA: Diagnosis present

## 2016-06-21 DIAGNOSIS — R001 Bradycardia, unspecified: Secondary | ICD-10-CM | POA: Diagnosis present

## 2016-06-21 DIAGNOSIS — R911 Solitary pulmonary nodule: Secondary | ICD-10-CM | POA: Diagnosis present

## 2016-06-21 DIAGNOSIS — Z6826 Body mass index (BMI) 26.0-26.9, adult: Secondary | ICD-10-CM

## 2016-06-21 LAB — CBC
HEMATOCRIT: 17.2 % — AB (ref 36.0–46.0)
Hemoglobin: 5.2 g/dL — CL (ref 12.0–15.0)
MCH: 22.9 pg — ABNORMAL LOW (ref 26.0–34.0)
MCHC: 30.2 g/dL (ref 30.0–36.0)
MCV: 75.8 fL — ABNORMAL LOW (ref 78.0–100.0)
Platelets: 219 10*3/uL (ref 150–400)
RBC: 2.27 MIL/uL — ABNORMAL LOW (ref 3.87–5.11)
RDW: 18.6 % — AB (ref 11.5–15.5)
WBC: 8.9 10*3/uL (ref 4.0–10.5)

## 2016-06-21 LAB — COMPREHENSIVE METABOLIC PANEL
ALT: 275 U/L — ABNORMAL HIGH (ref 14–54)
ANION GAP: 15 (ref 5–15)
AST: 352 U/L — AB (ref 15–41)
Albumin: 2.4 g/dL — ABNORMAL LOW (ref 3.5–5.0)
Alkaline Phosphatase: 107 U/L (ref 38–126)
BILIRUBIN TOTAL: 0.9 mg/dL (ref 0.3–1.2)
BUN: 97 mg/dL — AB (ref 6–20)
CO2: 21 mmol/L — ABNORMAL LOW (ref 22–32)
Calcium: 8.2 mg/dL — ABNORMAL LOW (ref 8.9–10.3)
Chloride: 97 mmol/L — ABNORMAL LOW (ref 101–111)
Creatinine, Ser: 4.05 mg/dL — ABNORMAL HIGH (ref 0.44–1.00)
GFR calc Af Amer: 12 mL/min — ABNORMAL LOW (ref >60)
GFR, EST NON AFRICAN AMERICAN: 11 mL/min — AB (ref >60)
Glucose, Bld: 174 mg/dL — ABNORMAL HIGH (ref 65–99)
POTASSIUM: 3.8 mmol/L (ref 3.5–5.1)
Sodium: 133 mmol/L — ABNORMAL LOW (ref 135–145)
TOTAL PROTEIN: 5.1 g/dL — AB (ref 6.5–8.1)

## 2016-06-21 LAB — GLUCOSE, CAPILLARY
GLUCOSE-CAPILLARY: 257 mg/dL — AB (ref 65–99)
Glucose-Capillary: 142 mg/dL — ABNORMAL HIGH (ref 65–99)

## 2016-06-21 LAB — VITAMIN B12: VITAMIN B 12: 3641 pg/mL — AB (ref 180–914)

## 2016-06-21 LAB — FOLATE: Folate: 14.3 ng/mL (ref >5.9)

## 2016-06-21 LAB — PHOSPHORUS: Phosphorus: 5.5 mg/dL — ABNORMAL HIGH (ref 2.5–4.6)

## 2016-06-21 LAB — RETICULOCYTES
RBC.: 2.18 MIL/uL — ABNORMAL LOW (ref 3.87–5.11)
Retic Count, Absolute: 43.6 10*3/uL (ref 19.0–186.0)
Retic Ct Pct: 2 % (ref 0.4–3.1)

## 2016-06-21 LAB — LACTIC ACID, PLASMA
Lactic Acid, Venous: 1.5 mmol/L (ref 0.5–1.9)
Lactic Acid, Venous: 1.5 mmol/L (ref 0.5–1.9)

## 2016-06-21 LAB — IRON AND TIBC
IRON: 9 ug/dL — AB (ref 28–170)
SATURATION RATIOS: 3 % — AB (ref 10.4–31.8)
TIBC: 339 ug/dL (ref 250–450)
UIBC: 330 ug/dL

## 2016-06-21 LAB — TROPONIN I: Troponin I: 0.33 ng/mL (ref <0.03)

## 2016-06-21 LAB — BRAIN NATRIURETIC PEPTIDE

## 2016-06-21 LAB — MAGNESIUM: Magnesium: 2.1 mg/dL (ref 1.7–2.4)

## 2016-06-21 LAB — I-STAT TROPONIN, ED: TROPONIN I, POC: 0.39 ng/mL — AB (ref 0.00–0.08)

## 2016-06-21 LAB — POC OCCULT BLOOD, ED: Fecal Occult Bld: NEGATIVE

## 2016-06-21 LAB — ACETAMINOPHEN LEVEL: Acetaminophen (Tylenol), Serum: <10 ug/mL — ABNORMAL LOW (ref 10–30)

## 2016-06-21 LAB — PREPARE RBC (CROSSMATCH)

## 2016-06-21 LAB — PROTIME-INR
INR: 1.36
PROTHROMBIN TIME: 16.9 s — AB (ref 11.4–15.2)

## 2016-06-21 LAB — FERRITIN: Ferritin: 60 ng/mL (ref 11–307)

## 2016-06-21 LAB — LIPASE, BLOOD: Lipase: 52 U/L — ABNORMAL HIGH (ref 11–51)

## 2016-06-21 MED ORDER — ALBUTEROL SULFATE (2.5 MG/3ML) 0.083% IN NEBU: 2.5000 mg | INHALATION_SOLUTION | Freq: Four times a day (QID) | RESPIRATORY_TRACT | Status: DC | PRN

## 2016-06-21 MED ORDER — ONDANSETRON HCL 4 MG PO TABS: 4.0000 mg | ORAL_TABLET | Freq: Four times a day (QID) | ORAL | Status: DC | PRN

## 2016-06-21 MED ORDER — SODIUM CHLORIDE 0.9 % IV BOLUS (SEPSIS)
500.0000 mL | Freq: Once | INTRAVENOUS | Status: AC
  Administered 2016-06-21: 500 mL via INTRAVENOUS

## 2016-06-21 MED ORDER — ONDANSETRON HCL 4 MG/2ML IJ SOLN: 4.0000 mg | Freq: Four times a day (QID) | INTRAMUSCULAR | Status: DC | PRN

## 2016-06-21 MED ORDER — SODIUM CHLORIDE 0.9% FLUSH
3.0000 mL | Freq: Two times a day (BID) | INTRAVENOUS | Status: DC
  Administered 2016-06-21 – 2016-06-23 (×6): 3 mL via INTRAVENOUS

## 2016-06-21 MED ORDER — MOMETASONE FURO-FORMOTEROL FUM 100-5 MCG/ACT IN AERO
1.0000 | INHALATION_SPRAY | Freq: Every day | RESPIRATORY_TRACT | Status: DC
  Administered 2016-06-22 – 2016-06-24 (×3): 1 via RESPIRATORY_TRACT
  Filled 2016-06-21: qty 8.8

## 2016-06-21 MED ORDER — COLLAGENASE 250 UNIT/GM EX OINT
TOPICAL_OINTMENT | Freq: Every day | CUTANEOUS | Status: DC
  Filled 2016-06-21: qty 30

## 2016-06-21 MED ORDER — PANTOPRAZOLE SODIUM 40 MG IV SOLR
80.0000 mg | Freq: Two times a day (BID) | INTRAVENOUS | Status: DC
Start: 2016-06-21 — End: 2016-06-22
  Administered 2016-06-21 – 2016-06-22 (×2): 80 mg via INTRAVENOUS
  Filled 2016-06-21 (×3): qty 80

## 2016-06-21 MED ORDER — SODIUM CHLORIDE 0.9 % IV SOLN
Freq: Once | INTRAVENOUS | Status: AC
  Administered 2016-06-21: 11:00:00 via INTRAVENOUS

## 2016-06-21 NOTE — Consult Note (Signed)
Cardiology Consult    Patient ID: [name] MRN: [id number], DOB/AGE: 1950/05/02   Admit date: 06/21/2016 Date of Consult: 06/21/2016  Primary Physician: Arnoldo Morale, MD Reason for Consult: elevated troponin Primary Cardiologist: Dr Percival Spanish Requesting Provider: Dr. Gwendlyn Deutscher  History of Present Illness    [name] is a 66 y.o. female who is being seen today for the evaluation of elevated troponin at the request of Dr. Gwendlyn Deutscher.  The patient has a medical history of CAD (NSTEMI 2007 in setting of anemia; LHC 50% LM, 60% LAD, 30% RI, occluded RCA with L-R collaterals, normal EF, treated medically; LHC 01/2015 prox-mid RCA 100%, ostial to prox LAD 70%, LM 50%, mid-distal circ 90%-not surgical candidate due to renal and pulmonary issues), chronic diastolic CHF, CKD stage III, tobacco abuse, PVD s/p left common iliac and external iliac artery stents as well as mesenteric/celiac/subclavian stenosis by duplexes below, renal artery stenosis, carotid disease s/p left carotid endarterectomy ~2000, bradycardia limiting BB titration, DM, COPD, HLD, HTN, GI AVM s/p cauterization, iron deficiency anemia, ?possible hx of TIA (in problem list by Dr. Percival Spanish) and scabies.   She presented for evaluation of nausea/vomiting and weakness for the last 1 week. She has had nausea and vomiting after every time that she tries to eat and has thus had poor oral intake. She has also had loose bowel movements, 1 per day. She denies any chest pain, increase in dyspnea, edema, lightheadedness or worsening of her chronic 3 pillow orthopnea. She denies any overt bleeding; no blood in stool or dark, tarry stools. Prior to this episode she had not exertional chest discomfort or DOE. She no longer uses oxygen as her O2 sat was 99% at one of her PCP visits. She sees nephrology and says that things are stable from nephrology viewpoint. She has been taking all of her medications as prescribed including aspirin and Plavix. She  continues to smoke 1 PPD, having tried to quit, but relapsing.  She was last seen in the cardiology office on 02/20/2015 and was without chest pain or shortness of breath. She was bradycardic with HR 47, asymptomatic. Coreg was decreased to 12.5 mg bid. She did not keep her subsequent follow up appointment. Her coreg is now down to 3.125 mg bid.  Past Medical History   Past Medical History:  Diagnosis Date  . Anemia, chronic disease 05/16/2008   Qualifier: Diagnosis of  By: Percival Spanish, MD, Farrel Gordon    . Bradycardia    a. Limiting BB titration.  [name] CAD (coronary artery disease)   . Carotid artery disease ([place])    a. Duplex 12/2014: left CEA patent with elevated velocities, 60-79% RICA (stable over serial exams), 58-30% LICA stenosis (stable over serial exams), 50% LECA, elevated velocities in bilateral subclavian arteries.  . Chronic diastolic CHF (congestive heart failure) ([place])   . CKD (chronic kidney disease), stage III   . COPD (chronic obstructive pulmonary disease) ([facility])    home oxygen  . Dementia   . Depression   . Diabetes mellitus   . Diabetic retinopathy ([place])   . Essential hypertension   . GI AVM (gastrointestinal arteriovenous vascular malformation)    small bowel  . Hyperlipidemia   . Hypertensive heart disease with congestive heart failure ([place])   . Iron deficiency anemia   . Peripheral vascular disease ([place])    a.s /p left common iliac and external iliac artery stents. b. H/o subclavian, mesenteric, and celiac artery stenosis (see below).  . Protein-calorie  malnutrition ([place]) 01/24/2015  . Renal artery stenosis ([place])    a. Duplex 2014: no obvious evidence of hemodynamically significant stenosis >60%, bilateral intrarenal arteries exhibit absent diastolic flow. There is evidence of elevated velocities of the mid aorta. 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  . Tobacco abuse   . 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 Dieaulafoy's lesion  01/25/2012    Past Surgical History:  Procedure Laterality Date  . APPENDECTOMY    . CARDIAC CATHETERIZATION N/A 01/23/2015   Procedure: Left Heart Cath and Coronary Angiography;  Surgeon: [name], MD;  Location: [facility];  Service: Cardiovascular;  Laterality: N/A;  . CAROTID ENDARTERECTOMY    . CESAREAN SECTION    . CHOLECYSTECTOMY    . ENTEROSCOPY  01/26/2012   Procedure: ENTEROSCOPY;  Surgeon: Gatha Mayer, MD;  Location: [facility];  Service: Endoscopy;  Laterality: N/A;  . ENTEROSCOPY N/A 03/25/2015   Procedure: ENTEROSCOPY;  Surgeon: Doran Stabler, MD;  Location: [facility];  Service: Endoscopy;  Laterality: N/A;  . ILIAC ARTERY STENT     left  . SHOULDER ARTHROSCOPY    . TONSILLECTOMY       Allergies  No Known Allergies  Inpatient Medications     Outpatient Medications    Prior to Admission medications   Medication Sig Start Date End Date Taking? Authorizing Provider  albuterol (PROVENTIL HFA;VENTOLIN HFA) 108 (90 Base) MCG/ACT inhaler Inhale 2 puffs into the lungs every 6 (six) hours as needed for wheezing or shortness of breath. 06/15/16  Yes Arnoldo Morale, MD  aspirin EC 81 MG tablet Take 81 mg by mouth daily.   Yes Historical Provider, MD  atorvastatin (LIPITOR) 80 MG tablet TAKE 1 TABLET BY MOUTH  DAILY AT 6 PM. 06/15/16  Yes Arnoldo Morale, MD  Blood Glucose Monitoring Suppl (TRUE METRIX AIR GLUCOSE METER) DEVI 1 Device by Does not apply route 4 (four) times daily -  before meals and at bedtime. 04/22/15  Yes Arnoldo Morale, MD  calcitRIOL (ROCALTROL) 0.5 MCG capsule Take 1 capsule (0.5 mcg total) by mouth daily. 06/15/16  Yes Arnoldo Morale, MD  calcium carbonate (OS-CAL - DOSED IN MG OF ELEMENTAL CALCIUM) 1250 (500 Ca) MG tablet Take 1 tablet (500 mg of elemental calcium total) by mouth 3 (three) times daily with meals. 04/07/15  Yes Modena Jansky, MD  carvedilol (COREG) 3.125 MG tablet TAKE 1 TABLET BY MOUTH TWO  TIMES DAILY WITH A MEAL 06/15/16   Yes Arnoldo Morale, MD  clopidogrel (PLAVIX) 75 MG tablet Take 1 tablet (75 mg total) by mouth daily. 06/15/16  Yes Arnoldo Morale, MD  collagenase (SANTYL) ointment Apply topically daily. Apply to right side of gluteal cleft Unstageable Pressure Injury once daily and PRN soiling. 04/07/15  Yes Modena Jansky, MD  donepezil (ARICEPT) 10 MG tablet Take 1 tablet (10 mg total) by mouth at bedtime. 06/15/16  Yes Arnoldo Morale, MD  ferrous sulfate 325 (65 FE) MG tablet Take 1 tablet (325 mg total) by mouth 2 (two) times daily with a meal. 06/15/16  Yes Arnoldo Morale, MD  fish oil-omega-3 fatty acids 1000 MG capsule Take 1 g by mouth 3 (three) times daily. Reported on 04/09/2015   Yes Historical Provider, MD  furosemide (LASIX) 80 MG tablet Take 1 tablet (80 mg total) by mouth 2 (two) times daily. 06/15/16  Yes Arnoldo Morale, MD  glucose blood (TRUE METRIX BLOOD GLUCOSE TEST) test strip Use as instructed  04/22/15  Yes Arnoldo Morale, MD  hydrOXYzine (ATARAX/VISTARIL) 25 MG tablet Take 1 tablet (25 mg total) by mouth 3 (three) times daily as needed. 12/23/15  Yes Arnoldo Morale, MD  isosorbide mononitrate (IMDUR) 30 MG 24 hr tablet Take 1 tablet (30 mg total) by mouth daily. 06/15/16  Yes Arnoldo Morale, MD  Lancet Devices [facility]) lancets Use as instructed daily. 04/21/15  Yes Arnoldo Morale, MD  mometasone-formoterol (DULERA) 100-5 MCG/ACT AERO Inhale 1 puff into the lungs daily. 06/15/16  Yes Arnoldo Morale, MD  NIFEdipine (PROCARDIA XL) 60 MG 24 hr tablet Take 1 tablet (60 mg total) by mouth daily. 06/15/16  Yes Arnoldo Morale, MD  promethazine (PHENERGAN) 25 MG tablet Take 1 tablet (25 mg total) by mouth every 8 (eight) hours as needed for nausea or vomiting. 06/15/16  Yes Arnoldo Morale, MD  sertraline (ZOLOFT) 50 MG tablet Take 1 tablet (50 mg total) by mouth daily. 06/15/16  Yes Arnoldo Morale, MD  sodium bicarbonate 650 MG tablet Take 1 tablet (650 mg total) by mouth 2 (two) times daily. 06/15/16  Yes Arnoldo Morale, MD    nitroGLYCERIN (NITROSTAT) 0.4 MG SL tablet Place 1 tablet (0.4 mg total) under the tongue every 5 (five) minutes as needed for chest pain. 01/28/15   Delfina Redwood, MD  Nutritional Supplements (FEEDING SUPPLEMENT, NEPRO CARB STEADY,) LIQD Take 237 mLs by mouth 2 (two) times daily between meals. Patient not taking: Reported on 03/04/2016 04/07/15   Modena Jansky, MD    Family History    Family History  Problem Relation Age of Onset  . Hypertension Mother   . Heart attack Mother   . Hypertension Father   . Angina Father   . Cancer Sister   . Cancer Brother   . Stroke Neg Hx     Social History    Social History   Social History  . Marital status: Married    Spouse name: N/A  . Number of children: N/A  . Years of education: N/A   Occupational History  . Not on file.   Social History Main Topics  . Smoking status: Current Every Day Smoker    Packs/day: 1.00    Years: 36.00    Types: Cigarettes    Last attempt to quit: 02/11/2015  . Smokeless tobacco: Never Used  . Alcohol use No  . Drug use: No  . Sexual activity: No   Other Topics Concern  . Not on file   Social History Narrative  . No narrative on file     Review of Systems    General:  No chills, fever, night sweats or weight changes.  Cardiovascular:  Chronic dyspnea on exertion not changed, chronic 3 pillow orthopnea unchanged, No chest pain, edema, palpitations, paroxysmal nocturnal dyspnea. Dermatological: No rash, lesions/masses Respiratory: No cough, dyspnea Urologic: No hematuria, dysuria Abdominal:   Positive for nausea, vomiting, diarrhea, No bright red blood per rectum, melena, or hematemesis Neurologic:  Positive for weakness, No visual changes, changes in mental status. All other systems reviewed and are otherwise negative except as noted above.  Physical Exam    Blood pressure (!) 92/44, pulse (!) 56, temperature 97.8 F (36.6 C), temperature source Oral, resp. rate 12, SpO2 100 %.   General: Pleasant, NAD, odor of cigarette smoke Psych: Normal affect. Neuro: Alert and oriented X 3. Moves all extremities spontaneously. HEENT: Normal  Neck: Supple without JVD. Lungs:  Resp regular and unlabored, CTA. Heart: RRR no s3, s4. 2/6 systolic  murmur at LUSB Abdomen: Soft, non-tender, non-distended, BS + x 4.  Extremities: No clubbing, cyanosis or edema. DP/PT/Radials 2+ and equal bilaterally.  Labs    Troponin [facility] of Care Test)  Recent Labs  06/21/16 1143  TROPIPOC 0.39*   No results for input(s): CKTOTAL, CKMB, TROPONINI in the last 72 hours. Lab Results  Component Value Date   WBC 8.9 06/21/2016   HGB 5.2 (LL) 06/21/2016   HCT 17.2 (L) 06/21/2016   MCV 75.8 (L) 06/21/2016   PLT 219 06/21/2016     Recent Labs Lab 06/21/16 1030  NA 133*  K 3.8  CL 97*  CO2 21*  BUN 97*  CREATININE 4.05*  CALCIUM 8.2*  PROT 5.1*  BILITOT 0.9  ALKPHOS 107  ALT 275*  AST 352*  GLUCOSE 174*   Lab Results  Component Value Date   CHOL 271 (H) 12/24/2015   HDL 32 (L) 12/24/2015   LDLCALC 172 (H) 12/24/2015   TRIG 333 (H) 12/24/2015   Lab Results  Component Value Date   DDIMER 0.48 01/25/2012     Radiology Studies    Ct Chest Wo Contrast  Result Date: 06/01/2016 CLINICAL DATA:  Routine followup COPD. EXAM: CT CHEST WITHOUT CONTRAST TECHNIQUE: Multidetector CT imaging of the chest was performed following the standard protocol without IV contrast. COMPARISON:  11/13/2015 FINDINGS: Chest wall: No breast masses, supraclavicular or axillary lymphadenopathy. Scattered macro calcifications in both breasts. Cardiovascular: The heart is within normal limits in size. No pericardial effusion. Stable advanced atherosclerotic calcifications involving the aorta and branch vessels. Dense three-vessel coronary artery calcifications are again noted. Mediastinum/Nodes: Persistent mediastinal and hilar lymphadenopathy likely related to the patient's COPD. 10 mm pre tracheal lymph  node on image 42 previously measured 9.5 mm. Multiple small anterior mediastinal lymph nodes are stable. Several prevascular lymph nodes are stable. 9 mm subcarinal lymph node on image number 65 previously measured 8 mm. Small bilateral hilar lymph nodes are also suspected but difficult to measure without contrast. Lungs/Pleura: Stable emphysematous changes and areas of pulmonary scarring. There are also numerous small, sub 3 mm pulmonary nodules bilaterally. The sub solid pulmonary nodule in the right lower lobe on image number 74 measures 5 mm and is stable. Stable 13 mm right middle lobe pulmonary nodule on image number 96. This contains calcifications and is likely a benign granuloma. Right upper lobe nodular density on image number 51 appears more cystic than on the prior study. No solid/nodular component. Measures 10 mm and previously measured 8 mm. Upper Abdomen: No significant upper abdominal findings Musculoskeletal: Stable severe degenerative changes involving the mid and lower thoracic spine. IMPRESSION: 1. Stable significant chronic lung disease with emphysema and pulmonary scarring. 2. Numerous small bilateral pulmonary nodules appear stable. No new lesions. 3. Stable 13 mm right middle lobe pulmonary lesion, likely partially calcified granuloma. 4. 10 mm right upper lobe cystic lesion. Recommend continued observation. 5. Stable mediastinal and hilar lymphadenopathy. 6. Stable severe atherosclerotic disease involving the abdominal and thoracic aorta and branch vessels. 7. Recommend repeat noncontrast chest CT in 6-12 months. Electronically Signed   By: Marijo Sanes M.D.   On: 06/01/2016 10:33   Dg Chest Portable 1 View  Result Date: 06/21/2016 CLINICAL DATA:  Anemia and shortness of breath EXAM: PORTABLE CHEST 1 VIEW COMPARISON:  April 04, 2015 and chest CT June 01, 2016 FINDINGS: There is no edema or consolidation. Scattered nodular opacities noted on recent chest CT are not appreciable by  radiography. There is cardiomegaly  with pulmonary vascular within normal limits. No adenopathy. There is aortic atherosclerosis. No bone lesions. IMPRESSION: No edema or consolidation. Nodular opacity seen on recent chest CT are not apparent by radiography. Stable cardiomegaly. There is aortic atherosclerosis. Electronically Signed   By: Lowella Grip III M.D.   On: 06/21/2016 12:34    EKG & Cardiac Imaging   EKG: Sinus bradycardia, 52 bpm, Nonspecific intraventricular conduction delay, Low voltage precordial leads, old Anteroseptal infarct, Nonspecific repol abnormality- No acute changes since last in 03/2015  Echocardiogram:  Last echo 04/03/2015 Study Conclusions  - Left ventricle: The cavity size was normal. Wall thickness was   normal. Systolic function was normal. The estimated ejection   fraction was in the range of 55% to 60%. Wall motion was normal;   there were no regional wall motion abnormalities. Features are   consistent with a pseudonormal left ventricular filling pattern,   with concomitant abnormal relaxation and increased filling   pressure (grade 2 diastolic dysfunction). - Mitral valve: Calcified annulus. Mildly thickened leaflets . Mild   thickening of the anterior leaflet and posterior leaflet,   involving the leaflet margin more than the base, possibly a sign   of previous rheumatic disease. Mobility of the posterior leaflet   was mildly restricted. Diastolic leaflet doming was present.   There was moderate regurgitation directed centrally. - Left atrium: The atrium was mildly dilated. - Pulmonary arteries: Systolic pressure was moderately increased.   PA peak pressure: 62 mm Hg (S). - Pericardium, extracardiac: A trivial pericardial effusion was   identified.  Assessment & Plan    Elevated troponins -hx of CAD with severe multivessel disease and being poor surgical candidate due to pulmonary and renal disease, being treated medically -First troponin 0.39.  No acute EKG changes. -Pt severely anemic with Hgb 5.2 -No chest pain and no recent exertional symptoms. Troponin elevation may be due to demand ischemia in setting of severe anemia. Continue to cycle troponins.  -Advise to monitor and proceed conservatively in consideration of her known multivessel CAD and concomitant medical issues making her a poor surgical candidate.   CAD -With severe multivessel disease treated medically as her pulmonary and renal diseases make her a poor surgical candidate. On Plavix 75 mg daily, aspirin 81 mg, carvedilol 3.125 mg bid, Imdur 30 mg daily -Currently no chest pain and no recent exertional symptoms.  -May need to stop Plavix at least short term, but this may put her at risk considering her very narrow coronary arteries.  Chronic diastolic CHF -EF 50-09%, grade 2 DD per echo 03/2015 -Home meds include lasix 80 mg bid, carvedilol 3.125 mg bid (low dose due to bradycardia) -CXR shows no edema.  -Currently looks dry.  -Pt currently receiving blood transfusions. Monitor for volume overload and need for lasix. -Strict I&O. Daily weights.   Anemia -Of chronic disease, CKD -Noted anemia going back to 2007 with Hgb good if >10. Last Hgb 9.3 on 04/21/15 -On iron 325 mg bid but continue to have microcytic iron deficiency anemia.  -Transfusion per IM -GI consulted: History of recurrent GI bleeds and associated anemia requiring transfusions due to upper intestinal AVMs. Suspect this episode is related to AVMs vs PUD, vs MWT(?). Plan for upper endoscopy, but may wait 5 days for Plavix washout. Stool heme positive.   Elevated LFTs -New for pt. Possibly related to hypoperfusion in setting of severe anemia.   Chronic kidney disease stage 4 -Manged by [facility] -SCr in the 2.5-4 range in  2017 and 2018.  GFR 23 12/2015, now GFR 11.  Current SCr 4.05.  K+ 3.8  Hypertension -Home meds: Procardia XL 60 mg daily, carvedilol 3.125 mg bid, Imdur 30 mg daily, Lasix 80  mg bid -BP is soft. Hold meds for now, review in am.  Diabetes -Diet controlled -Most recent A1c 6.1 on 06/15/2016  Hyperlipidemia -Lipid panel 12/24/2015: TC 271, LDL 172, Trig 333 -Atorvastatin 80 mg- pt states has been taking meds as ordered. Clearly not at goal.  -No statin at present due to elevated LFTs  COPD -Was on oxygen previously, no longer needs. Inhalers. Does not appear to be having exacerbation at this time.   Tobacco use -Was working on cessation, but relapsed and continues to smoke 1PPD. -Cessation is important in setting of severe CVD- multiple locations and COPD.    Tildon Husky, NP-C 06/21/2016, 1:54 PM Pager: [phone]

## 2016-06-21 NOTE — ED Provider Notes (Addendum)
[facility] DEPT Provider Note   CSN: [id number] Arrival date & time: 06/21/16  1021     History   Chief Complaint Chief Complaint  Patient presents with  . Emesis    HPI [name] is a 66 y.o. female.  HPI Patient presents to the emergency room for evaluation of nausea vomiting and weakness. Patient states the symptoms started last week. She seemed to get a little bit better as the week progressed but on Sunday she started having episodes of nausea and vomiting again. Patient states she was having a few episodes each day. She was also having diarrhea. She denies any abdominal pain or fevers.  She did not have any vomiting this morning but she was feeling very weak so they decided to come into the emergency room. She denies chest pain or shortness of breath. She denies any rectal bleeding or melena. Past Medical History:  Diagnosis Date  . Anemia, chronic disease 05/16/2008   Qualifier: Diagnosis of  By: Percival Spanish, MD, Farrel Gordon    . Bradycardia    a. Limiting BB titration.  [name] CAD (coronary artery disease)   . Carotid artery disease ([place])    a. Duplex 12/2014: left CEA patent with elevated velocities, 60-79% RICA (stable over serial exams), 67-61% LICA stenosis (stable over serial exams), 50% LECA, elevated velocities in bilateral subclavian arteries.  . Chronic diastolic CHF (congestive heart failure) ([place])   . CKD (chronic kidney disease), stage III   . COPD (chronic obstructive pulmonary disease) (HCC)    home oxygen  . Dementia   . Depression   . Diabetes mellitus   . Diabetic retinopathy ([place])   . Essential hypertension   . GI AVM (gastrointestinal arteriovenous vascular malformation)    small bowel  . Hyperlipidemia   . Hypertensive heart disease with congestive heart failure ([place])   . Iron deficiency anemia   . Peripheral vascular disease ([facility])    a.s /p left common iliac and external iliac artery stents. b. H/o subclavian, mesenteric, and celiac artery  stenosis (see below).  . Protein-calorie malnutrition ([place]) 01/24/2015  . Renal artery stenosis ([place])    a. Duplex 2014: no obvious evidence of hemodynamically significant stenosis >60%, bilateral intrarenal arteries exhibit absent diastolic flow. There is evidence of elevated velocities of the mid aorta. 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  . Tobacco abuse   . 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 Dieaulafoy's lesion 01/25/2012    Patient Active Problem List   Diagnosis Date Noted  . Dementia 03/04/2016  . CAP (community acquired pneumonia) 05/13/2015  . Indwelling catheter present on admission 04/21/2015  . Difficulty breathing   . Pulmonary infiltrates   . Staphylococcus aureus bacteremia   . Palliative care encounter   . Malnutrition of moderate degree 03/30/2015  . HCAP (healthcare-associated pneumonia) 03/29/2015  . Acute respiratory failure ([place]) 03/29/2015  . Hypomagnesemia 03/29/2015  . Low serum vitamin D 03/29/2015  . Metabolic acidosis 95/10/3265  . Coronary artery disease involving native coronary artery of native heart with angina pectoris ([place])   . Sacral ulcer ([place])   . Urinary retention   . Hypocalcemia 03/20/2015  . Lewy body dementia without behavioral disturbance   . Protein-calorie malnutrition ([place]) 01/24/2015  . Abnormal nuclear stress test   . CKD (chronic kidney disease), stage IV ([place])   . Carotid artery disease ([place])   . CAD (coronary artery disease)   . Chronic diastolic CHF (congestive heart failure) ([place])   .  Essential hypertension   . Former tobacco use   . Bradycardia   . On home oxygen therapy 11/10/2014  . Gastric angiodysplasia 01/26/2012  . 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 Diet-controlled diabetes mellitus ([place]) 01/25/2012  . Iron deficiency anemia 01/25/2012  . Renal artery stenosis ([place]) 11/19/2010  . Peripheral vascular disease ([place]) 11/19/2010  . Diabetes ([place])  05/16/2008  . Hyperlipidemia 05/16/2008  . OBESITY 05/16/2008  . Anemia, chronic disease 05/16/2008  . ANXIETY DEPRESSION 05/16/2008  . History of CVA (cerebrovascular accident) 05/16/2008  . Chronic obstructive pulmonary disease ([place]) 05/16/2008  . DIVERTICULITIS, HX OF 05/16/2008    Past Surgical History:  Procedure Laterality Date  . APPENDECTOMY    . CARDIAC CATHETERIZATION N/A 01/23/2015   Procedure: Left Heart Cath and Coronary Angiography;  Surgeon: [name], MD;  Location: [facility];  Service: Cardiovascular;  Laterality: N/A;  . CAROTID ENDARTERECTOMY    . CESAREAN SECTION    . CHOLECYSTECTOMY    . ENTEROSCOPY  01/26/2012   Procedure: ENTEROSCOPY;  Surgeon: Gatha Mayer, MD;  Location: [facility];  Service: Endoscopy;  Laterality: N/A;  . ENTEROSCOPY N/A 03/25/2015   Procedure: ENTEROSCOPY;  Surgeon: Doran Stabler, MD;  Location: [facility];  Service: Endoscopy;  Laterality: N/A;  . ILIAC ARTERY STENT     left  . SHOULDER ARTHROSCOPY    . TONSILLECTOMY      OB History    No data available       Home Medications    Prior to Admission medications   Medication Sig Start Date End Date Taking? Authorizing Provider  aspirin EC 81 MG tablet Take 81 mg by mouth daily.   Yes Historical Provider, MD  atorvastatin (LIPITOR) 80 MG tablet TAKE 1 TABLET BY MOUTH  DAILY AT 6 PM. 06/15/16  Yes Arnoldo Morale, MD  carvedilol (COREG) 3.125 MG tablet TAKE 1 TABLET BY MOUTH TWO  TIMES DAILY WITH A MEAL 06/15/16  Yes Arnoldo Morale, MD  albuterol (PROVENTIL HFA;VENTOLIN HFA) 108 (90 Base) MCG/ACT inhaler Inhale 2 puffs into the lungs every 6 (six) hours as needed for wheezing or shortness of breath. 06/15/16   Arnoldo Morale, MD  Blood Glucose Monitoring Suppl (TRUE METRIX AIR GLUCOSE METER) DEVI 1 Device by Does not apply route 4 (four) times daily -  before meals and at bedtime. 04/22/15   Arnoldo Morale, MD  calcitRIOL (ROCALTROL) 0.5 MCG capsule Take 1 capsule (0.5 mcg  total) by mouth daily. 06/15/16   Arnoldo Morale, MD  calcium carbonate (OS-CAL - DOSED IN MG OF ELEMENTAL CALCIUM) 1250 (500 Ca) MG tablet Take 1 tablet (500 mg of elemental calcium total) by mouth 3 (three) times daily with meals. 04/07/15   Modena Jansky, MD  clopidogrel (PLAVIX) 75 MG tablet Take 1 tablet (75 mg total) by mouth daily. 06/15/16   Arnoldo Morale, MD  collagenase (SANTYL) ointment Apply topically daily. Apply to right side of gluteal cleft Unstageable Pressure Injury once daily and PRN soiling. 04/07/15   Modena Jansky, MD  donepezil (ARICEPT) 10 MG tablet Take 1 tablet (10 mg total) by mouth at bedtime. 06/15/16   Arnoldo Morale, MD  ferrous sulfate 325 (65 FE) MG tablet Take 1 tablet (325 mg total) by mouth 2 (two) times daily with a meal. 06/15/16   Arnoldo Morale, MD  fish oil-omega-3 fatty acids 1000 MG capsule Take 1 g by mouth 3 (three) times daily. Reported on  04/09/2015    Historical Provider, MD  furosemide (LASIX) 80 MG tablet Take 1 tablet (80 mg total) by mouth 2 (two) times daily. 06/15/16   Arnoldo Morale, MD  glucose blood (TRUE METRIX BLOOD GLUCOSE TEST) test strip Use as instructed 04/22/15   Arnoldo Morale, MD  hydrOXYzine (ATARAX/VISTARIL) 25 MG tablet Take 1 tablet (25 mg total) by mouth 3 (three) times daily as needed. 12/23/15   Arnoldo Morale, MD  isosorbide mononitrate (IMDUR) 30 MG 24 hr tablet Take 1 tablet (30 mg total) by mouth daily. 06/15/16   Arnoldo Morale, MD  Lancet Devices [facility]) lancets Use as instructed daily. 04/21/15   Arnoldo Morale, MD  mometasone-formoterol (DULERA) 100-5 MCG/ACT AERO Inhale 1 puff into the lungs daily. 06/15/16   Arnoldo Morale, MD  NIFEdipine (PROCARDIA XL) 60 MG 24 hr tablet Take 1 tablet (60 mg total) by mouth daily. 06/15/16   Arnoldo Morale, MD  nitroGLYCERIN (NITROSTAT) 0.4 MG SL tablet Place 1 tablet (0.4 mg total) under the tongue every 5 (five) minutes as needed for chest pain. 01/28/15   Delfina Redwood, MD  Nutritional  Supplements (FEEDING SUPPLEMENT, NEPRO CARB STEADY,) LIQD Take 237 mLs by mouth 2 (two) times daily between meals. Patient not taking: Reported on 03/04/2016 04/07/15   Modena Jansky, MD  promethazine (PHENERGAN) 25 MG tablet Take 1 tablet (25 mg total) by mouth every 8 (eight) hours as needed for nausea or vomiting. 06/15/16   Arnoldo Morale, MD  sertraline (ZOLOFT) 50 MG tablet Take 1 tablet (50 mg total) by mouth daily. 06/15/16   Arnoldo Morale, MD  sodium bicarbonate 650 MG tablet Take 1 tablet (650 mg total) by mouth 2 (two) times daily. 06/15/16   Arnoldo Morale, MD    Family History Family History  Problem Relation Age of Onset  . Hypertension Mother   . Heart attack Mother   . Hypertension Father   . Angina Father   . Cancer Sister   . Cancer Brother   . Stroke Neg Hx     Social History Social History  Substance Use Topics  . Smoking status: Current Every Day Smoker    Packs/day: 1.00    Years: 36.00    Types: Cigarettes    Last attempt to quit: 02/11/2015  . Smokeless tobacco: Never Used  . Alcohol use No     Allergies   Patient has no known allergies.   Review of Systems Review of Systems  All other systems reviewed and are negative.    Physical Exam Updated Vital Signs BP (!) 91/50   Pulse (!) 53   Temp 97.7 F (36.5 C)   Resp 14   SpO2 91%   Physical Exam  Constitutional: No distress.  Patient appears much older than her age  HENT:  Head: Normocephalic and atraumatic.  Right Ear: External ear normal.  Left Ear: External ear normal.  Eyes: Conjunctivae are normal. Right eye exhibits no discharge. Left eye exhibits no discharge. No scleral icterus.  Neck: Neck supple. No tracheal deviation present.  Cardiovascular: Normal rate, regular rhythm and intact distal pulses.   Pulmonary/Chest: Effort normal and breath sounds normal. No stridor. No respiratory distress. She has no wheezes. She has no rales.  Abdominal: Soft. Bowel sounds are normal. She  exhibits no distension. There is no tenderness. There is no rebound and no guarding.  Genitourinary:  Genitourinary Comments: Brown stool, no blood  Musculoskeletal: She exhibits no edema or tenderness.  Neurological: She is not  disoriented. No cranial nerve deficit (no facial droop, extraocular movements intact, no slurred speech) or sensory deficit. She exhibits normal muscle tone. She displays no seizure activity. Coordination normal.  Generalized weakness  Skin: Skin is warm and dry. No rash noted. There is pallor.  Psychiatric: She has a normal mood and affect.  Nursing note and vitals reviewed.    ED Treatments / Results  Labs (all labs ordered are listed, but only abnormal results are displayed) Labs Reviewed  LIPASE, BLOOD - Abnormal; Notable for the following:       Result Value   Lipase 52 (*)    All other components within normal limits  COMPREHENSIVE METABOLIC PANEL - Abnormal; Notable for the following:    Sodium 133 (*)    Chloride 97 (*)    CO2 21 (*)    Glucose, Bld 174 (*)    BUN 97 (*)    Creatinine, Ser 4.05 (*)    Calcium 8.2 (*)    Total Protein 5.1 (*)    Albumin 2.4 (*)    AST 352 (*)    ALT 275 (*)    GFR calc non Af Amer 11 (*)    GFR calc Af Amer 12 (*)    All other components within normal limits  CBC - Abnormal; Notable for the following:    RBC 2.27 (*)    Hemoglobin 5.2 (*)    HCT 17.2 (*)    MCV 75.8 (*)    MCH 22.9 (*)    RDW 18.6 (*)    All other components within normal limits  I-STAT TROPOININ, ED - Abnormal; Notable for the following:    Troponin i, poc 0.39 (*)    All other components within normal limits  URINALYSIS, ROUTINE W REFLEX MICROSCOPIC  VITAMIN B12  FOLATE  IRON AND TIBC  FERRITIN  RETICULOCYTES  POC OCCULT BLOOD, ED  PREPARE RBC (CROSSMATCH)  TYPE AND SCREEN    EKG  EKG Interpretation  Date/Time:  Tuesday Jun 21 2016 10:39:49 EDT Ventricular Rate:  52 PR Interval:    QRS Duration: 118 QT  Interval:  516 QTC Calculation: 480 R Axis:   111 Text Interpretation:  Sinus rhythm Nonspecific intraventricular conduction delay Low voltage, precordial leads Anteroseptal infarct, old Nonspecific repol abnormality, diffuse leads Baseline wander in lead(s) I III aVL Since last tracing rate slower Confirmed by Detrick Dani  MD-J, Marlin Jarrard ([id number]) on 06/21/2016 11:22:58 AM        Procedures .Critical Care Performed by: Dorie Rank Authorized by: Dorie Rank   Critical care provider statement:    Critical care time (minutes):  45   Critical care was necessary to treat or prevent imminent or life-threatening deterioration of the following conditions: Severe anemia.   Critical care was time spent personally by me on the following activities:  Discussions with consultants, evaluation of patient's response to treatment, examination of patient, ordering and performing treatments and interventions, ordering and review of laboratory studies, ordering and review of radiographic studies, pulse oximetry, re-evaluation of patient's condition, obtaining history from patient or surrogate and review of old charts    (including critical care time)  Medications Ordered in ED Medications  sodium chloride 0.9 % bolus 500 mL (0 mLs Intravenous Stopped 06/21/16 1210)  0.9 %  sodium chloride infusion ( Intravenous New Bag/Given 06/21/16 1125)     Initial Impression / Assessment and Plan / ED Course  I have reviewed the triage vital signs and the nursing notes.  Pertinent labs &  imaging results that were available during my care of the patient were reviewed by me and considered in my medical decision making (see chart for details).   Clinical Course as of Jun 21 1237  Tue Jun 21, 2016  1239 D/w Family Med service.  Will admit patient.  I will consult cardiology per their request  [JK]    Clinical Course User Index [JK] Dorie Rank, MD  patient presented to the emergency room with acute weakness. Laboratory tests show  severe anemia.  Patient's Hemoccult test is negative. The etiology of her worsening anemia is unclear. This clearly is the cause of her fatigue and weakness however.  Blood transfusions have been ordered.  Laboratory tests also show worsening of her chronic renal failure. Elevated troponin but she denies any pain.  No acute ischemia on EKG.  Will need to monitor.  LFTs also elevated but she is not having any pain.  May benefit from abdominal imaging but that can wait until she has been transfused and better stabilized.   I will consult with the medical service for admission and further treatment.    Final Clinical Impressions(s) / ED Diagnoses   Final diagnoses:  Anemia, unspecified type  Cardiac enzymes elevated  Stage 4 chronic kidney disease (HCC)  Elevated liver enzymes        Dorie Rank, MD 06/21/16 1239

## 2016-06-21 NOTE — H&P (Signed)
[facility] Admission History and Physical Service Pager: [phone]  Patient name: [name] Medical record number: [id number] Date of birth: Feb 26, 1950 Age: 66 y.o. Gender: female  Primary Care Provider: Arnoldo Morale, MD Consultants: cardiology, GI Code Status: DNR  Chief Complaint: fatigue  Assessment and Plan: [name] is a 66 y.o. female presenting with vomiting and diarrhea x 1 week and fatique. 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   Anemia: last Hgb 03/2015 9.3. Patient reports vomiting without signs of blood or coffee grounds, but endorses about 1 week of hematochezia associated with 3-4 stools per day. Iron panel ordered in the ED. 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 Most recent EGD 03/2015 by Dr. Loletha [name] was normal, however  stated "patient most likely has more distal small bowel AVMs or aspirin-induced ulcer(s), beyond the reach of the scope and exacerbated by dual anti-platelet therapy. This has been causing chronic occult GI blood loss and anemia, which is worsened by CKD." Patient with a colonoscopy in August 2008 minutes have a tiny hyperplastic polyp and diverticulosis Possible etiologies for anemia include acute GI bleed, diverticulosis vs recurrent AVMs. Has been transfused 2U PRBC in the past for acute GI bleed. -admit to stepdown, Dr. Gwendlyn [name] attending -transfuse 2U PRBC -GI consult -IV protonix 80mg  q12H x 4 doses -CBCs q4H  -cardiac monitoring -FOBT ordered  Abdominal pain: TTP over bilateral lower quadrants and suprapubic region. Possible etiologies include mesenteric ischemia, diverticulosis, UTI. Patient is a significant vasculopath with history of  Stenting the  left common iliac and external iliac artery stents as well as mesenteric/celiac/subclavian stenosis by  duplexes of 70% -CT abdomen without contrast 2/2 AoCKD -lactic acid  -UA ordered  Acute on CKD IV 2/2 RAS: follows with [name]. Baseline Cr recently appears to be 2.5-2.8. Thought to be secondary to renal artery stenosis, last renal artery dupplex 08/2012 showed stenosis >60% but "bilateral intrarenal arteries exhibit absent diastolic flow." -CMP in am - Could consider consulting nephrology if no improvement with rehydration   Elevated troponin in setting of significant CAD: patient with history of diastolic CHF and known CAD with sever three vessel CAD (01/2015) as well as cardiomegaly on CXR. Etiology demand ischemia, ACS less likely 2/2 no STEMI or CP.  -cardiology consulted -cycle troponins -EKG in am -Holding plavix for now given possibly acute GI bleed   Bradycardia: HR at last 3 OP visits over the past year has been 60s-80s.  -cardiac monitoring -cardiology as above -holding all home PO meds, including nitrates, BB -EKG in am  CHF: given AoCKD and transaminitis with possible congestive hepatopathy, concerned for fluid overload despite minimal edema on exam. Last echo 04/13/2015 with EF 55-60%, moderate mitral regurgitation noted.  -repeat echo -consider HF consult pending cards recs  -consider IV diuresis after transfusion if BNP wnl  -BNP - In/outs and daily weights   Transaminitis: possible etiology includes acute hepatitis, HIV, congestive hepatopathy.  -hepatitis panel - Hepatitis B surface antibody  -acetaminophen level  -repeat CMP in am  Dementia: PCP notes suggest Lewy Body dementia. History of CVA in 2010. -holding aricept 2/2 NPO  DMII: diet controlled per PCP notes. Last HgbA1c 06/15/16 of 6.1 -NPO pending GI eval  Tobacco Abuse: 1 ppd x many years, briefly quit in the  recent past, back to smoking 1ppd now - Consider a nictoine patch in the future- hold off for now   Protein calorie malnutrition: albumin 2.4. Poor PO intake  recently.  -consider nutrition consult once taking PO  COPD/pulmonary nodule/pulmonary HTN: Followed by pulmonology for COPD and several pulmonary nodules along the middle mediastinal/hilar adenopathy. Currently on 1 L [place]. Last ECHO noted a pulmonary peak pressure of 62 mmHG - Continue Dulera  - PRN albuterol   FEN/GI: NPO/IV PPI Prophylaxis: no DVT prophylaxis as c/w acute bleed  Disposition: admit to stepdown  History of Present Illness:  [name] is a 67 y.o. female presenting with diarrhea and vomiting for about 1 week. Patient indicates having bloody diarrhea. Has had 3-4 bloody bowel movements for about a week. Indicates recently having some vomiting over the past couple of days, though no hematochezia. Patient indicates feeling tired. Denies any swelling, chest pain, SOB, orthopnea (3 + pillows, old), PND. Notes some weakness in her legs. Per husband has felt some pain in mid-abdomen. Patient indicates no recent medication changes, or exposure to sick contacts. History of blood transfusion when jejunal AVM was found. Husband states that daughter manages meds with pill box. Patient can't recall last colonoscopy. Of note, patient in good spirits and joking with examiner.   Review Of Systems: Per HPI with the following additions:   Review of Systems  Constitutional: Positive for malaise/fatigue. Negative for fever and weight loss.  Cardiovascular: Negative for chest pain, palpitations and leg swelling.  Gastrointestinal: Positive for abdominal pain, blood in stool, diarrhea, nausea and vomiting.  Genitourinary: Negative for dysuria and frequency.  Neurological: Positive for weakness. Negative for dizziness.    Patient Active Problem List   Diagnosis Date Noted  . Dementia 03/04/2016  . CAP (community acquired pneumonia) 05/13/2015  . Indwelling catheter present on admission 04/21/2015  . Difficulty breathing   . Pulmonary infiltrates   . Staphylococcus aureus bacteremia   .  Palliative care encounter   . Malnutrition of moderate degree 03/30/2015  . HCAP (healthcare-associated pneumonia) 03/29/2015  . Acute respiratory failure ([place]) 03/29/2015  . Hypomagnesemia 03/29/2015  . Low serum vitamin D 03/29/2015  . Metabolic acidosis 74/25/9563  . Coronary artery disease involving native coronary artery of native heart with angina pectoris ([name])   . Sacral ulcer ([place])   . Urinary retention   . Hypocalcemia 03/20/2015  . Lewy body dementia without behavioral disturbance   . Protein-calorie malnutrition ([place]) 01/24/2015  . Abnormal nuclear stress test   . CKD (chronic kidney disease), stage IV ([place])   . Carotid artery disease ([place])   . CAD (coronary artery disease)   . Chronic diastolic CHF (congestive heart failure) ([place])   . Essential hypertension   . Former tobacco use   . Bradycardia   . On home oxygen therapy 11/10/2014  . Gastric angiodysplasia 01/26/2012  . 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 Diet-controlled diabetes mellitus ([place]) 01/25/2012  . Iron deficiency anemia 01/25/2012  . Renal artery stenosis ([place]) 11/19/2010  . Peripheral vascular disease ([place]) 11/19/2010  . Diabetes ([place]) 05/16/2008  . Hyperlipidemia 05/16/2008  . OBESITY 05/16/2008  . Anemia, chronic disease 05/16/2008  . ANXIETY DEPRESSION 05/16/2008  . History of CVA (cerebrovascular accident) 05/16/2008  . Chronic obstructive pulmonary disease ([place]) 05/16/2008  . DIVERTICULITIS, HX OF 05/16/2008    Past Medical History: Past Medical History:  Diagnosis Date  . Anemia, chronic disease 05/16/2008   Qualifier: Diagnosis of  ByPercival Spanish, MD, Farrel Gordon    . Bradycardia    a. Limiting BB titration.  [name] CAD (coronary artery disease)   . Carotid artery disease ([place])    a. Duplex 12/2014: left CEA patent with elevated velocities, 60-79% RICA (stable over serial exams), 56-81% LICA stenosis (stable over serial exams), 50% LECA, elevated velocities in  bilateral subclavian arteries.  . Chronic diastolic CHF (congestive heart failure) ([place])   . CKD (chronic kidney disease), stage III   . COPD (chronic obstructive pulmonary disease) ([facility])    home oxygen  . Dementia   . Depression   . Diabetes mellitus   . Diabetic retinopathy ([place])   . Essential hypertension   . GI AVM (gastrointestinal arteriovenous vascular malformation)    small bowel  . Hyperlipidemia   . Hypertensive heart disease with congestive heart failure ([place])   . Iron deficiency anemia   . Peripheral vascular disease ([place])    a.s /p left common iliac and external iliac artery stents. b. H/o subclavian, mesenteric, and celiac artery stenosis (see below).  . Protein-calorie malnutrition ([place]) 01/24/2015  . Renal artery stenosis ([place])    a. Duplex 2014: no obvious evidence of hemodynamically significant stenosis >60%, bilateral intrarenal arteries exhibit absent diastolic flow. There is evidence of elevated velocities of the mid aorta. 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  . Tobacco abuse   . 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 Dieaulafoy's lesion 01/25/2012    Past Surgical History: Past Surgical History:  Procedure Laterality Date  . APPENDECTOMY    . CARDIAC CATHETERIZATION N/A 01/23/2015   Procedure: Left Heart Cath and Coronary Angiography;  Surgeon: [name], MD;  Location: [facility];  Service: Cardiovascular;  Laterality: N/A;  . CAROTID ENDARTERECTOMY    . CESAREAN SECTION    . CHOLECYSTECTOMY    . ENTEROSCOPY  01/26/2012   Procedure: ENTEROSCOPY;  Surgeon: Gatha Mayer, MD;  Location: [facility];  Service: Endoscopy;  Laterality: N/A;  . ENTEROSCOPY N/A 03/25/2015   Procedure: ENTEROSCOPY;  Surgeon: Doran Stabler, MD;  Location: [facility];  Service: Endoscopy;  Laterality: N/A;  . ILIAC ARTERY STENT     left  . SHOULDER ARTHROSCOPY    . TONSILLECTOMY      Social History: Social History  Substance  Use Topics  . Smoking status: Current Every Day Smoker    Packs/day: 1.00    Years: 36.00    Types: Cigarettes    Last attempt to quit: 02/11/2015  . Smokeless tobacco: Never Used  . Alcohol use No   Additional social history: lives with husband, daughter manages meds.   Please also refer to relevant sections of EMR.  Family History: Family History  Problem Relation Age of Onset  . Hypertension Mother   . Heart attack Mother   . Hypertension Father   . Angina Father   . Cancer Sister   . Cancer Brother   . Stroke Neg Hx     Allergies and Medications: No Known Allergies No current facility-administered medications on file prior to encounter.    Current Outpatient Prescriptions on File Prior to Encounter  Medication Sig Dispense Refill  . atorvastatin (LIPITOR) 80 MG tablet TAKE 1 TABLET BY MOUTH  DAILY AT 6 PM. 90 tablet 1  . carvedilol (COREG) 3.125 MG tablet TAKE 1 TABLET BY MOUTH TWO  TIMES DAILY WITH A MEAL 180 tablet 1  . albuterol (PROVENTIL HFA;VENTOLIN HFA) 108 (90  Base) MCG/ACT inhaler Inhale 2 puffs into the lungs every 6 (six) hours as needed for wheezing or shortness of breath. 1 Inhaler 3  . Blood Glucose Monitoring Suppl (TRUE METRIX AIR GLUCOSE METER) DEVI 1 Device by Does not apply route 4 (four) times daily -  before meals and at bedtime. 1 Device 0  . calcitRIOL (ROCALTROL) 0.5 MCG capsule Take 1 capsule (0.5 mcg total) by mouth daily. 90 capsule 1  . calcium carbonate (OS-CAL - DOSED IN MG OF ELEMENTAL CALCIUM) 1250 (500 Ca) MG tablet Take 1 tablet (500 mg of elemental calcium total) by mouth 3 (three) times daily with meals. 90 tablet 0  . clopidogrel (PLAVIX) 75 MG tablet Take 1 tablet (75 mg total) by mouth daily. 90 tablet 1  . collagenase (SANTYL) ointment Apply topically daily. Apply to right side of gluteal cleft Unstageable Pressure Injury once daily and PRN soiling. 15 g 0  . donepezil (ARICEPT) 10 MG tablet Take 1 tablet (10 mg total) by mouth at  bedtime. 90 tablet 1  . ferrous sulfate 325 (65 FE) MG tablet Take 1 tablet (325 mg total) by mouth 2 (two) times daily with a meal. 180 tablet 1  . fish oil-omega-3 fatty acids 1000 MG capsule Take 1 g by mouth 3 (three) times daily. Reported on 04/09/2015    . furosemide (LASIX) 80 MG tablet Take 1 tablet (80 mg total) by mouth 2 (two) times daily. 180 tablet 1  . glucose blood (TRUE METRIX BLOOD GLUCOSE TEST) test strip Use as instructed 100 each 12  . hydrOXYzine (ATARAX/VISTARIL) 25 MG tablet Take 1 tablet (25 mg total) by mouth 3 (three) times daily as needed. 60 tablet 2  . isosorbide mononitrate (IMDUR) 30 MG 24 hr tablet Take 1 tablet (30 mg total) by mouth daily. 90 tablet 1  . Lancet Devices (ACCU-CHEK SOFTCLIX) lancets Use as instructed daily. 1 each 5  . mometasone-formoterol (DULERA) 100-5 MCG/ACT AERO Inhale 1 puff into the lungs daily. 1 Inhaler 3  . NIFEdipine (PROCARDIA XL) 60 MG 24 hr tablet Take 1 tablet (60 mg total) by mouth daily. 30 tablet 5  . nitroGLYCERIN (NITROSTAT) 0.4 MG SL tablet Place 1 tablet (0.4 mg total) under the tongue every 5 (five) minutes as needed for chest pain. 10 tablet 0  . Nutritional Supplements (FEEDING SUPPLEMENT, NEPRO CARB STEADY,) LIQD Take 237 mLs by mouth 2 (two) times daily between meals. (Patient not taking: Reported on 03/04/2016)    . promethazine (PHENERGAN) 25 MG tablet Take 1 tablet (25 mg total) by mouth every 8 (eight) hours as needed for nausea or vomiting. 20 tablet 0  . sertraline (ZOLOFT) 50 MG tablet Take 1 tablet (50 mg total) by mouth daily. 90 tablet 1  . sodium bicarbonate 650 MG tablet Take 1 tablet (650 mg total) by mouth 2 (two) times daily. 180 tablet 1  . [DISCONTINUED] loratadine (CLARITIN) 10 MG tablet Take 10 mg by mouth daily.        Objective: BP (!) 99/46   Pulse (!) 54   Temp 98.2 F (36.8 C) (Oral)   Resp 14   SpO2 92%  Exam: General: pale, ill appearing elderly female lying in bed.  Eyes: PEERLA,  EOMI ENTM: MMM, poor dentition Neck: supple, shoddy LAD, no JVD Cardiovascular: distant heart sounds, bradycardia, no murmur appreciated Respiratory: faint bibasilar crackles, easy WOB, on 1L [place] Gastrointestinal: TTP over bilateral lower quadrants and suprapubic region, +BS in all quadrants. Midline lower abdominal scar,  RUQ scar.  MSK: spontaneously moves all extremities, trace edema in bilateral LEs and bilateral hips.  Derm: no rashes or wounds Neuro: CN II-XII grossly intact Psych: mood and affect appropriate  Labs and Imaging: CBC BMET   Recent Labs Lab 06/21/16 1030  WBC 8.9  HGB 5.2*  HCT 17.2*  PLT 219    Recent Labs Lab 06/21/16 1030  NA 133*  K 3.8  CL 97*  CO2 21*  BUN 97*  CREATININE 4.05*  GLUCOSE 174*  CALCIUM 8.2*     Retics 2.0% Trop 0.39 Albumin 2.4 AST 352, ALT 275 Lipase 52  Sela Hilding, MD 06/21/2016, 12:43 PM PGY-1, [place] [facility] pager: [phone], text pages welcome  UPPER LEVEL ADDENDUM  I have read the above note and made revisions highlighted in blue.  Kerrin Mo, MD, PGY-2 [facility]

## 2016-06-21 NOTE — ED Notes (Signed)
Attempted report 

## 2016-06-21 NOTE — Consult Note (Signed)
[facility] Consult: 1:28 PM 06/21/2016  LOS: 0 days    Referring Provider: Dr Gwendlyn Deutscher  Primary Care Physician:  Arnoldo Morale, MD Primary Gastroenterologist:  Dr. Henrene Pastor    Reason for Consultation:  Bleeding per rectum.     HPI: [name] is a 66 y.o. female.  PMH  COPD, previously oxygen dependent.  CKD stage 4.  DM 2.  Peripheral neuropathy.  CHF, diastolic.  ASPVD.  s/p CEA and iliac artery stent.    Chronic Plavix and low dose ASA.  Dementia.  Protein malnutrition.    Hx previous GI bleeds and blood loss as well as chronic anemia.  Has required transfusions, last was in 02/2015.  Chronic po iron.  Not on PPI.   2005 capsule endo: blood spots, erosions in SB.   2005 enteroscopy: normal.  2006 EGD: normal.   2008 Colonoscopy.  Hyperplastic polyps, diverticulosis.   01/2012 Enteroscopy.  Ablation of gastric AVM.   03/2015 Enteroscopy.  Non-bleeding gastric erosion at greater curvature, no intervention.   At [place] visit 06/15/16 reported anorexia, diarrhea, cramping abdomen, vomiting for 1 week.  This was gradually improving.  Was given IV zofran and prescribed po prn Phenergan.  Vomiting persisted. Towards the end of last week, it turned dark but not grossly bloody. Starting yesterday afternoon/evening stools turn from watery and brown to liquid and black. Observing the stool in the commode, she could see red blood leaching from the stool sitting in bottom of commode. She still denies abdominal pain. The reason she came to the emergency room today is because of weakness.   Last episode of vomiting or stool was late in evening yesterday, nothing today.  She denies chest pain, cough. There is some element of dyspnea on exertion but this is not profound. A little bit dizzy but no presyncope. Prior to onset of these  symptoms, she is actually doing pretty well, eating well, no weight loss. Stools, occurring about once a day, were brown and formed. She's been using some sort of an analgesic, a white pill, which is over the counter and she's not sure if it's acetaminophen or some other pain reliever.  No ETOH.    Returned to ED today with ongoing N/V/D.  Unable to tolerate PO.   On DRE stool brown, no blood.  Hemoglobin is 5.2 with MCV of 70 5.8. Prior hemoglobin 03/2015 9.3 with MCV 91.    Past Medical History:  Diagnosis Date  . Anemia, chronic disease 05/16/2008   Qualifier: Diagnosis of  By: Percival Spanish, MD, Farrel Gordon    . Bradycardia    a. Limiting BB titration.  [name] CAD (coronary artery disease)   . Carotid artery disease ([place])    a. Duplex 12/2014: left CEA patent with elevated velocities, 60-79% RICA (stable over serial exams), 58-85% LICA stenosis (stable over serial exams), 50% LECA, elevated velocities in bilateral subclavian arteries.  . Chronic diastolic CHF (congestive heart failure) ([place])   . CKD (chronic kidney disease), stage III   . COPD (chronic obstructive pulmonary disease) ([place])  home oxygen  . Dementia   . Depression   . Diabetes mellitus   . Diabetic retinopathy ([place])   . Essential hypertension   . GI AVM (gastrointestinal arteriovenous vascular malformation)    small bowel  . Hyperlipidemia   . Hypertensive heart disease with congestive heart failure ([place])   . Iron deficiency anemia   . Peripheral vascular disease ([place])    a.s /p left common iliac and external iliac artery stents. b. H/o subclavian, mesenteric, and celiac artery stenosis (see below).  . Protein-calorie malnutrition ([place]) 01/24/2015  . Renal artery stenosis ([place])    a. Duplex 2014: no obvious evidence of hemodynamically significant stenosis >60%, bilateral intrarenal arteries exhibit absent diastolic flow. There is evidence of elevated velocities of the mid aorta. There is celiac artery and superior mesenteric  artery stenosis >70%.  . Tobacco abuse   . 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 APPENDECTOMY    . CARDIAC CATHETERIZATION N/A 01/23/2015   Procedure: Left Heart Cath and Coronary Angiography;  Surgeon: [name], MD;  Location: [facility];  Service: Cardiovascular;  Laterality: N/A;  . CAROTID ENDARTERECTOMY    . CESAREAN SECTION    . CHOLECYSTECTOMY    . ENTEROSCOPY  01/26/2012   Procedure: ENTEROSCOPY;  Surgeon: Gatha Mayer, MD;  Location: [facility];  Service: Endoscopy;  Laterality: N/A;  . ENTEROSCOPY N/A 03/25/2015   Procedure: ENTEROSCOPY;  Surgeon: Doran Stabler, MD;  Location: [facility];  Service: Endoscopy;  Laterality: N/A;  . ILIAC ARTERY STENT     left  . SHOULDER ARTHROSCOPY    . TONSILLECTOMY      Prior to Admission medications   Medication Sig Start Date End Date Taking? Authorizing Provider  albuterol (PROVENTIL HFA;VENTOLIN HFA) 108 (90 Base) MCG/ACT inhaler Inhale 2 puffs into the lungs every 6 (six) hours as needed for wheezing or shortness of breath. 06/15/16  Yes Arnoldo Morale, MD  aspirin EC 81 MG tablet Take 81 mg by mouth daily.   Yes Historical Provider, MD  atorvastatin (LIPITOR) 80 MG tablet TAKE 1 TABLET BY MOUTH  DAILY AT 6 PM. 06/15/16  Yes Arnoldo Morale, MD  Blood Glucose Monitoring Suppl (TRUE METRIX AIR GLUCOSE METER) DEVI 1 Device by Does not apply route 4 (four) times daily -  before meals and at bedtime. 04/22/15  Yes Arnoldo Morale, MD  calcitRIOL (ROCALTROL) 0.5 MCG capsule Take 1 capsule (0.5 mcg total) by mouth daily. 06/15/16  Yes Arnoldo Morale, MD  calcium carbonate (OS-CAL - DOSED IN MG OF ELEMENTAL CALCIUM) 1250 (500 Ca) MG tablet Take 1 tablet (500 mg of elemental calcium total) by mouth 3 (three) times daily with meals. 04/07/15  Yes Modena Jansky, MD  carvedilol (COREG) 3.125 MG tablet TAKE 1 TABLET BY MOUTH TWO  TIMES DAILY WITH A MEAL  06/15/16  Yes Arnoldo Morale, MD  clopidogrel (PLAVIX) 75 MG tablet Take 1 tablet (75 mg total) by mouth daily. 06/15/16  Yes Arnoldo Morale, MD  collagenase (SANTYL) ointment Apply topically daily. Apply to right side of gluteal cleft Unstageable Pressure Injury once daily and PRN soiling. 04/07/15  Yes Modena Jansky, MD  donepezil (ARICEPT) 10 MG tablet Take 1 tablet (10 mg total) by mouth at bedtime. 06/15/16  Yes Arnoldo Morale, MD  ferrous sulfate 325 (65 FE) MG tablet Take 1 tablet (325 mg total) by mouth 2 (two)  times daily with a meal. 06/15/16  Yes Arnoldo Morale, MD  fish oil-omega-3 fatty acids 1000 MG capsule Take 1 g by mouth 3 (three) times daily. Reported on 04/09/2015   Yes Historical Provider, MD  furosemide (LASIX) 80 MG tablet Take 1 tablet (80 mg total) by mouth 2 (two) times daily. 06/15/16  Yes Arnoldo Morale, MD  glucose blood (TRUE METRIX BLOOD GLUCOSE TEST) test strip Use as instructed 04/22/15  Yes Arnoldo Morale, MD  hydrOXYzine (ATARAX/VISTARIL) 25 MG tablet Take 1 tablet (25 mg total) by mouth 3 (three) times daily as needed. 12/23/15  Yes Arnoldo Morale, MD  isosorbide mononitrate (IMDUR) 30 MG 24 hr tablet Take 1 tablet (30 mg total) by mouth daily. 06/15/16  Yes Arnoldo Morale, MD  Lancet Devices [facility]) lancets Use as instructed daily. 04/21/15  Yes Arnoldo Morale, MD  mometasone-formoterol (DULERA) 100-5 MCG/ACT AERO Inhale 1 puff into the lungs daily. 06/15/16  Yes Arnoldo Morale, MD  NIFEdipine (PROCARDIA XL) 60 MG 24 hr tablet Take 1 tablet (60 mg total) by mouth daily. 06/15/16  Yes Arnoldo Morale, MD  promethazine (PHENERGAN) 25 MG tablet Take 1 tablet (25 mg total) by mouth every 8 (eight) hours as needed for nausea or vomiting. 06/15/16  Yes Arnoldo Morale, MD  sertraline (ZOLOFT) 50 MG tablet Take 1 tablet (50 mg total) by mouth daily. 06/15/16  Yes Arnoldo Morale, MD  sodium bicarbonate 650 MG tablet Take 1 tablet (650 mg total) by mouth 2 (two) times daily. 06/15/16  Yes Arnoldo Morale,  MD  nitroGLYCERIN (NITROSTAT) 0.4 MG SL tablet Place 1 tablet (0.4 mg total) under the tongue every 5 (five) minutes as needed for chest pain. 01/28/15   Delfina Redwood, MD  Nutritional Supplements (FEEDING SUPPLEMENT, NEPRO CARB STEADY,) LIQD Take 237 mLs by mouth 2 (two) times daily between meals. Patient not taking: Reported on 03/04/2016 04/07/15   Modena Jansky, MD    Scheduled Meds:  Infusions: . pantoprazole (PROTONIX) IV     PRN Meds:    Allergies as of 06/21/2016  . (No Known Allergies)    Family History  Problem Relation Age of Onset  . Hypertension Mother   . Heart attack Mother   . Hypertension Father   . Angina Father   . Cancer Sister   . Cancer Brother   . Stroke Neg Hx     Social History   Social History  . Marital status: Married    Spouse name: N/A  . Number of children: N/A  . Years of education: N/A   Occupational History  . Not on file.   Social History Main Topics  . Smoking status: Current Every Day Smoker    Packs/day: 1.00    Years: 36.00    Types: Cigarettes    Last attempt to quit: 02/11/2015  . Smokeless tobacco: Never Used  . Alcohol use No  . Drug use: No  . Sexual activity: No   Other Topics Concern  . Not on file   Social History Narrative  . No narrative on file    REVIEW OF SYSTEMS: Constitutional:  Per HPI ENT:  No nose bleeds Pulm:  Per HPI CV:  No palpitations, no LE edema.  GU:  No oliguria, hematuria, frequency GI:  Per HPI.  No dysphagia Heme:  Other than GI bleeding in HPI, no unusual or excessive bleeding or bruising.   Transfusions:  None since 2017.  Not aware that she is receiving any Procrit/Epogen treatment through  Dr. Florene Glen, her nephrologist Neuro:  No headaches, no peripheral tingling or numbness Derm:  No itching, no rash or sores.  Endocrine:  No sweats or chills.  No polyuria or dysuria Immunization:  Did not inquire as to recent vaccinations. Travel:  None beyond local counties in last  few months.    PHYSICAL EXAM: Vital signs in last 24 hours: Vitals:   06/21/16 1259 06/21/16 1300  BP: (!) 99/42 (!) 92/44  Pulse: (!) 55 (!) 56  Resp: 12 12  Temp: 97.8 F (36.6 C)    Wt Readings from Last 3 Encounters:  06/15/16 58.1 kg (128 lb)  03/04/16 56.4 kg (124 lb 6.4 oz)  12/23/15 55.4 kg (122 lb 3.2 oz)    General: Pleasant, pale, small framed/petite WF.  Looks a bit older than her stated age of 41.  Looks chronically ill, comfortable and is fully alert. Head:  No facial swelling or asymmetry. No signs of head trauma.  Eyes:  Conjunctiva pale. No scleral icterus. EOMI. Ears:  Not hard of hearing  Nose:  No discharge or congestion Mouth:  Tongue midline. Most of her teeth are absent. Oral mucosa is pink, moist, clear. Neck:  No JVD, no thyromegaly, no masses. Lungs:  Clear bilaterally. No cough. No labored breathing. Heart: RRR with soft, early systolic murmur. S1, S2 audible. Abdomen:  Soft. Not distended. Not tender. Bowel sounds active. No HSM, masses, bruits, hernias..   Rectal: Stool is brown without any visible blood. It is trace FOBT positive. No palpable masses, no hemorrhoids.   Musc/Skeltl: Slight spinal kyphosis but no other gross joint deformities. No joint erythema or swelling. Extremities:  No CCE. 3 plus pedal pulses bilaterally.  Neurologic:  Patient is alert. Oriented times 3. No obvious tremor. Moves all 4 limbs, strength was not tested. Skin:  No telangiectasia, no rashes, no sores. Tattoos:  None Nodes:  No cervical adenopathy   Psych:  Pleasant, calm, cooperative.  Intake/Output from previous day: No intake/output data recorded. Intake/Output this shift: Total I/O In: 500 [IV Piggyback:500] Out: -   LAB RESULTS:  Recent Labs  06/21/16 1030  WBC 8.9  HGB 5.2*  HCT 17.2*  PLT 219   BMET Lab Results  Component Value Date   NA 133 (L) 06/21/2016   NA 140 12/24/2015   NA 141 04/21/2015   K 3.8 06/21/2016   K 5.0 12/24/2015   K  3.7 04/21/2015   CL 97 (L) 06/21/2016   CL 111 (H) 12/24/2015   CL 105 04/21/2015   CO2 21 (L) 06/21/2016   CO2 22 12/24/2015   CO2 24 04/21/2015   GLUCOSE 174 (H) 06/21/2016   GLUCOSE 73 12/24/2015   GLUCOSE 85 04/21/2015   BUN 97 (H) 06/21/2016   BUN 32 (H) 12/24/2015   BUN 23 04/21/2015   CREATININE 4.05 (H) 06/21/2016   CREATININE 2.19 (H) 12/24/2015   CREATININE 1.64 (H) 04/21/2015   CALCIUM 8.2 (L) 06/21/2016   CALCIUM 9.8 12/24/2015   CALCIUM 8.4 (L) 04/21/2015   LFT  Recent Labs  06/21/16 1030  PROT 5.1*  ALBUMIN 2.4*  AST 352*  ALT 275*  ALKPHOS 107  BILITOT 0.9   PT/INR Lab Results  Component Value Date   INR 1.10 01/24/2015   INR 1.09 01/23/2015   INR 1.05 01/22/2015   Hepatitis Panel No results for input(s): HEPBSAG, HCVAB, HEPAIGM, HEPBIGM in the last 72 hours. C-Diff No components found for: CDIFF Lipase     Component Value Date/Time  LIPASE 52 (H) 06/21/2016 1030     RADIOLOGY STUDIES: Dg Chest Portable 1 View  Result Date: 06/21/2016 CLINICAL DATA:  Anemia and shortness of breath EXAM: PORTABLE CHEST 1 VIEW COMPARISON:  April 04, 2015 and chest CT June 01, 2016 FINDINGS: There is no edema or consolidation. Scattered nodular opacities noted on recent chest CT are not appreciable by radiography. There is cardiomegaly with pulmonary vascular within normal limits. No adenopathy. There is aortic atherosclerosis. No bone lesions. IMPRESSION: No edema or consolidation. Nodular opacity seen on recent chest CT are not apparent by radiography. Stable cardiomegaly. There is aortic atherosclerosis. Electronically Signed   By: Lowella Grip III M.D.   On: 06/21/2016 12:34     IMPRESSION:   *  GI bleed with dark emesis and dark stools. Last episodes were yesterday evening. Stool on DRE today is brown but still heme positive. History recurrent GI bleeds and associated transfusion requiring anemia due to upper intestinal AVMs, suspect same vs PUD vs  MWT.  *  Acute on chronic anemia. Microcytosis consistent with iron deficiency despite oral iron chronically. I wonder if her worsening kidney disease is contributing to the anemia in addition to GI blood loss.  *  Elevated transaminases with normal alkaline phosphatase and total bilirubin. Minor elevation of lipase.  Not having abdominal pain.   *  Chronic low-dose aspirin and Plavix for peripheral artery disease.  Last dose was on 06/20/16.  *  Elevated troponin, 0.39.  *  AKI with hx CKD stage 4, GFR 23 12/2015, now GFR 11.      PLAN:     *  Will need upper endoscopy. Timing of this per Dr. Hilarie Fredrickson but suspect we need to wait 5 days for Plavix washout.  *  IV PPI drip in place.  Coags, acute hepatitis panel, APAP level ordered.   Post PRBC CBC tonight and Hgb in AM.     *  abdominal ultrasound for biliary/liver eval, since unable to administer IV contrast for a CT scan due to AKI.  LFTs in AM.  I cancelled non-contrast CT scan order.      Azucena Freed  06/21/2016, 1:28 PM Pager: [phone]

## 2016-06-21 NOTE — ED Triage Notes (Addendum)
Pt reports n/v/d since Saturday. Reports unable to keep anything down. Pt a/ox4, resp e/u. Skin very pale

## 2016-06-21 NOTE — ED Notes (Addendum)
Pt SPO2 91%, placed on 2L of O2 via nasal cannula. SPO2 improved to 100%. Hx of COPD, decreased O2 to 1L/min.

## 2016-06-22 ENCOUNTER — Inpatient Hospital Stay (HOSPITAL_COMMUNITY): Payer: Medicare Other | Admitting: Certified Registered Nurse Anesthetist

## 2016-06-22 ENCOUNTER — Encounter (HOSPITAL_COMMUNITY): Payer: Self-pay | Admitting: Internal Medicine

## 2016-06-22 ENCOUNTER — Ambulatory Visit (HOSPITAL_COMMUNITY): Payer: Medicare Other

## 2016-06-22 ENCOUNTER — Encounter (HOSPITAL_COMMUNITY): Admission: EM | Disposition: A | Discharge status: Home Health | Payer: Self-pay | Source: Home / Self Care

## 2016-06-22 DIAGNOSIS — I509 Heart failure, unspecified: Secondary | ICD-10-CM

## 2016-06-22 DIAGNOSIS — I248 Other forms of acute ischemic heart disease: Secondary | ICD-10-CM

## 2016-06-22 DIAGNOSIS — K209 Esophagitis, unspecified without bleeding: Secondary | ICD-10-CM

## 2016-06-22 DIAGNOSIS — K31819 Angiodysplasia of stomach and duodenum without bleeding: Secondary | ICD-10-CM

## 2016-06-22 HISTORY — PX: ENTEROSCOPY: SHX5533

## 2016-06-22 HISTORY — PX: COLONOSCOPY WITH ESOPHAGOGASTRODUODENOSCOPY (EGD): SHX5779

## 2016-06-22 HISTORY — PX: ESOPHAGOGASTRODUODENOSCOPY (EGD) WITH PROPOFOL: SHX5813

## 2016-06-22 LAB — ECHOCARDIOGRAM COMPLETE
Height: 59 in
Weight: 1993.6 oz

## 2016-06-22 LAB — GLUCOSE, CAPILLARY
GLUCOSE-CAPILLARY: 115 mg/dL — AB (ref 65–99)
GLUCOSE-CAPILLARY: 124 mg/dL — AB (ref 65–99)
Glucose-Capillary: 269 mg/dL — ABNORMAL HIGH (ref 65–99)
Glucose-Capillary: 128 mg/dL — ABNORMAL HIGH (ref 65–99)

## 2016-06-22 LAB — LIPASE, BLOOD: LIPASE: 33 U/L (ref 11–51)

## 2016-06-22 LAB — CBC
HCT: 26.1 % — ABNORMAL LOW (ref 36.0–46.0)
HCT: 26.6 % — ABNORMAL LOW (ref 36.0–46.0)
HEMATOCRIT: 24.8 % — AB (ref 36.0–46.0)
HEMOGLOBIN: 7.9 g/dL — AB (ref 12.0–15.0)
HEMOGLOBIN: 8.3 g/dL — AB (ref 12.0–15.0)
Hemoglobin: 8.3 g/dL — ABNORMAL LOW (ref 12.0–15.0)
MCH: 24.3 pg — ABNORMAL LOW (ref 26.0–34.0)
MCH: 24.5 pg — ABNORMAL LOW (ref 26.0–34.0)
MCH: 24.6 pg — AB (ref 26.0–34.0)
MCHC: 31.2 g/dL (ref 30.0–36.0)
MCHC: 31.8 g/dL (ref 30.0–36.0)
MCHC: 31.9 g/dL (ref 30.0–36.0)
MCV: 77 fL — ABNORMAL LOW (ref 78.0–100.0)
MCV: 77.3 fL — ABNORMAL LOW (ref 78.0–100.0)
MCV: 78 fL (ref 78.0–100.0)
PLATELETS: 145 10*3/uL — AB (ref 150–400)
PLATELETS: 138 10*3/uL — AB (ref 150–400)
Platelets: 158 10*3/uL (ref 150–400)
RBC: 3.21 MIL/uL — AB (ref 3.87–5.11)
RBC: 3.39 MIL/uL — ABNORMAL LOW (ref 3.87–5.11)
RBC: 3.41 MIL/uL — ABNORMAL LOW (ref 3.87–5.11)
RDW: 17.1 % — AB (ref 11.5–15.5)
RDW: 17.1 % — ABNORMAL HIGH (ref 11.5–15.5)
RDW: 17.5 % — ABNORMAL HIGH (ref 11.5–15.5)
WBC: 8.7 10*3/uL (ref 4.0–10.5)
WBC: 9.3 10*3/uL (ref 4.0–10.5)
WBC: 9.5 10*3/uL (ref 4.0–10.5)

## 2016-06-22 LAB — COMPREHENSIVE METABOLIC PANEL
ALBUMIN: 2.3 g/dL — AB (ref 3.5–5.0)
ALK PHOS: 89 U/L (ref 38–126)
ALT: 238 U/L — ABNORMAL HIGH (ref 14–54)
AST: 252 U/L — AB (ref 15–41)
Anion gap: 12 (ref 5–15)
BILIRUBIN TOTAL: 1.2 mg/dL (ref 0.3–1.2)
BUN: 90 mg/dL — AB (ref 6–20)
CALCIUM: 8 mg/dL — AB (ref 8.9–10.3)
CO2: 23 mmol/L (ref 22–32)
CREATININE: 3.67 mg/dL — AB (ref 0.44–1.00)
Chloride: 101 mmol/L (ref 101–111)
GFR calc Af Amer: 14 mL/min — ABNORMAL LOW (ref >60)
GFR, EST NON AFRICAN AMERICAN: 12 mL/min — AB (ref >60)
GLUCOSE: 133 mg/dL — AB (ref 65–99)
POTASSIUM: 3.2 mmol/L — AB (ref 3.5–5.1)
Sodium: 136 mmol/L (ref 135–145)
TOTAL PROTEIN: 5.2 g/dL — AB (ref 6.5–8.1)

## 2016-06-22 LAB — BPAM RBC
BLOOD PRODUCT EXPIRATION DATE: 201805182359
BLOOD PRODUCT EXPIRATION DATE: 201805192359
ISSUE DATE / TIME: 201805011228
ISSUE DATE / TIME: 201805011738
UNIT TYPE AND RH: 5100
Unit Type and Rh: 5100

## 2016-06-22 LAB — TROPONIN I
TROPONIN I: 0.34 ng/mL — AB (ref <0.03)
Troponin I: 0.49 ng/mL (ref <0.03)
Troponin I: 0.43 ng/mL (ref <0.03)
Troponin I: 0.41 ng/mL (ref <0.03)

## 2016-06-22 LAB — TYPE AND SCREEN
ABO/RH(D): O POS
ANTIBODY SCREEN: NEGATIVE
UNIT DIVISION: 0
Unit division: 0

## 2016-06-22 LAB — HIV ANTIBODY (ROUTINE TESTING W REFLEX): HIV SCREEN 4TH GENERATION: NONREACTIVE

## 2016-06-22 LAB — HEPATITIS PANEL, ACUTE
HEP A IGM: NEGATIVE
HEP B C IGM: NEGATIVE
HEP B S AG: NEGATIVE

## 2016-06-22 LAB — PROTIME-INR
INR: 1.35
Prothrombin Time: 16.8 seconds — ABNORMAL HIGH (ref 11.4–15.2)

## 2016-06-22 LAB — TSH: TSH: 2.763 u[IU]/mL (ref 0.350–4.500)

## 2016-06-22 SURGERY — ESOPHAGOGASTRODUODENOSCOPY (EGD) WITH PROPOFOL
Anesthesia: Monitor Anesthesia Care

## 2016-06-22 MED ORDER — PROPOFOL 500 MG/50ML IV EMUL: INTRAVENOUS | Status: DC | PRN

## 2016-06-22 MED ORDER — POTASSIUM CHLORIDE CRYS ER 20 MEQ PO TBCR: 40.0000 meq | EXTENDED_RELEASE_TABLET | Freq: Two times a day (BID) | ORAL | Status: DC

## 2016-06-22 MED ORDER — GLUCAGON HCL RDNA (DIAGNOSTIC) 1 MG IJ SOLR
INTRAMUSCULAR | Status: AC
  Filled 2016-06-22: qty 1

## 2016-06-22 MED ORDER — PHENYLEPHRINE 40 MCG/ML (10ML) SYRINGE FOR IV PUSH (FOR BLOOD PRESSURE SUPPORT)
PREFILLED_SYRINGE | INTRAVENOUS | Status: DC | PRN
  Administered 2016-06-22: 120 ug via INTRAVENOUS

## 2016-06-22 MED ORDER — PROPOFOL 10 MG/ML IV BOLUS
INTRAVENOUS | Status: DC | PRN
  Administered 2016-06-22: 75 mg via INTRAVENOUS
  Administered 2016-06-22 (×3): 25 mg via INTRAVENOUS

## 2016-06-22 MED ORDER — PANTOPRAZOLE SODIUM 40 MG PO TBEC
40.0000 mg | DELAYED_RELEASE_TABLET | Freq: Two times a day (BID) | ORAL | Status: DC
  Administered 2016-06-22 – 2016-06-24 (×4): 40 mg via ORAL
  Filled 2016-06-22 (×6): qty 1

## 2016-06-22 MED ORDER — LACTATED RINGERS IV SOLN
INTRAVENOUS | Status: DC | PRN
  Administered 2016-06-22: 11:00:00 via INTRAVENOUS

## 2016-06-22 MED ORDER — FUROSEMIDE 10 MG/ML IJ SOLN
20.0000 mg | Freq: Two times a day (BID) | INTRAMUSCULAR | Status: DC
  Administered 2016-06-22 – 2016-06-23 (×2): 20 mg via INTRAVENOUS
  Filled 2016-06-22 (×2): qty 2

## 2016-06-22 MED ORDER — POTASSIUM CHLORIDE CRYS ER 20 MEQ PO TBCR
40.0000 meq | EXTENDED_RELEASE_TABLET | Freq: Two times a day (BID) | ORAL | Status: DC
  Administered 2016-06-22: 40 meq via ORAL
  Filled 2016-06-22: qty 2

## 2016-06-22 SURGICAL SUPPLY — 15 items

## 2016-06-22 NOTE — Progress Notes (Signed)
Post Transfusion Hgb 7.9. Notified provider on call.

## 2016-06-22 NOTE — Transfer of Care (Signed)
Immediate Anesthesia Transfer of Care Note  Patient: [name]  Procedure(s) Performed: Procedure(s): ESOPHAGOGASTRODUODENOSCOPY (EGD) WITH PROPOFOL (N/A) ENTEROSCOPY (N/A)  Patient Location: PACU and Endoscopy Unit  Anesthesia Type:MAC  Level of Consciousness: awake, patient cooperative and lethargic  Airway & Oxygen Therapy: Patient Spontanous Breathing and Patient connected to face mask oxygen  Post-op Assessment: Report given to RN and Post -op Vital signs reviewed and stable  Post vital signs: Reviewed and stable  Last Vitals:  Vitals:   06/22/16 0747 06/22/16 1012  BP: 128/60 (!) 146/44  Pulse: (!) 59 (!) 54  Resp: 11 (!) 9  Temp: 36.6 C 36.6 C    Last Pain:  Vitals:   06/22/16 1012  TempSrc: Oral  PainSc:          Complications: No apparent anesthesia complications

## 2016-06-22 NOTE — Progress Notes (Addendum)
Consent signed, Joellen Jersey in endoscopy notified.

## 2016-06-22 NOTE — Progress Notes (Signed)
  Echocardiogram 2D Echocardiogram has been performed.  Nathanal Hermiz L Androw 06/22/2016, 4:29 PM

## 2016-06-22 NOTE — Progress Notes (Signed)
PT Cancellation Note  Patient Details Name: [name] MRN: [id number] DOB: [birth year]   Cancelled Treatment:    Reason Eval/Treat Not Completed: Patient at procedure or test/unavailable.  Will f/u as able.  Bennett Vanscyoc E Penven-Crew 06/22/2016, 2:27 PM

## 2016-06-22 NOTE — Consult Note (Signed)
           [facility] CM Primary Care Navigator  06/22/2016  [name] 1950/05/15 [id number]   Went to see patientat the bedside to identify possible discharge needsbut she is off the unit. RN reports that patient is in a procedure (Endo)at this time.   Will attempt to meet with patient at another time, when she is available in room.  For questions, please contact:  Dannielle Huh, BSN, RN- [facility] Primary Care Navigator  Telephone: [phone] [place]

## 2016-06-22 NOTE — Anesthesia Postprocedure Evaluation (Addendum)
Anesthesia Post Note  Patient: [name]  Procedure(s) Performed: Procedure(s) (LRB): ESOPHAGOGASTRODUODENOSCOPY (EGD) WITH PROPOFOL (N/A) ENTEROSCOPY (N/A)  Patient location during evaluation: Endoscopy Anesthesia Type: MAC Level of consciousness: awake and alert Pain management: pain level controlled Vital Signs Assessment: post-procedure vital signs reviewed and stable Respiratory status: spontaneous breathing, nonlabored ventilation, respiratory function stable and patient connected to nasal cannula oxygen Cardiovascular status: stable and blood pressure returned to baseline Anesthetic complications: no       Last Vitals:  Vitals:   06/22/16 1158 06/22/16 1234  BP: (!) 121/29 123/79  Pulse: (!) 49 (!) 50  Resp: 18 14  Temp: 36.7 C 36.4 C    Last Pain:  Vitals:   06/22/16 1234  TempSrc: Oral                 [name]

## 2016-06-22 NOTE — Anesthesia Preprocedure Evaluation (Signed)
Anesthesia Evaluation  Patient identified by MRN, date of birth, ID band Patient awake    Reviewed: Allergy & Precautions, NPO status , Patient's Chart, lab work & pertinent test results  History of Anesthesia Complications Negative for: history of anesthetic complications  Airway Mallampati: II  TM Distance: >3 FB Neck ROM: Full    Dental no notable dental hx. (+) Poor Dentition   Pulmonary COPD,  COPD inhaler, Current Smoker, former smoker,    Pulmonary exam normal breath sounds clear to auscultation       Cardiovascular hypertension, Pt. on medications + CAD, + Peripheral Vascular Disease and +CHF  Normal cardiovascular exam Rhythm:Regular Rate:Normal  Presented yesterday for endo and having chest pain, case cancelled and evaluated by cardiology. EKG unchanged and cardiology reports no additional means to further optimize the patient. Patient is anemic and in need of scope. Not endorsing chest pain at this time. Cleared by cardiology for procedure.   Neuro/Psych PSYCHIATRIC DISORDERS Anxiety Depression negative neurological ROS     GI/Hepatic negative GI ROS, Neg liver ROS,   Endo/Other  diabetes, Type 2  Renal/GU Renal disease  negative genitourinary   Musculoskeletal negative musculoskeletal ROS (+)   Abdominal   Peds negative pediatric ROS (+)  Hematology  (+) anemia ,   Anesthesia Other Findings   Reproductive/Obstetrics negative OB ROS                             Anesthesia Physical Anesthesia Plan  ASA: III  Anesthesia Plan: MAC   Post-op Pain Management:    Induction: Intravenous  Airway Management Planned: Nasal Cannula, Natural Airway and Simple Face Mask  Additional Equipment: None  Intra-op Plan:   Post-operative Plan:   Informed Consent: I have reviewed the patients History and Physical, chart, labs and discussed the procedure including the risks, benefits and  alternatives for the proposed anesthesia with the patient or authorized representative who has indicated his/her understanding and acceptance.   Dental advisory given  Plan Discussed with: CRNA and Surgeon  Anesthesia Plan Comments:         Anesthesia Quick Evaluation

## 2016-06-22 NOTE — Op Note (Signed)
[facility] Patient Name: [name] Procedure Date : 06/22/2016 MRN: [id number] Attending MD: Jerene Bears , MD Date of Birth: 09-12-1950 CSN: [id number] Age: 66 Admit Type: Inpatient Procedure:                Small bowel enteroscopy Indications:              Iron deficiency anemia, Occult blood in stool,                            coffee-ground emesis Providers:                Lajuan Lines. Hilarie Fredrickson, MD, Cleda Daub, RN, Tinnie Gens,                            Technician, Corliss Parish, Technician Referring MD:             [facility] Medicines:                Monitored Anesthesia Care Complications:            No immediate complications. Estimated Blood Loss:     Estimated blood loss: none. Procedure:                Pre-Anesthesia Assessment:                           - Prior to the procedure, a History and Physical                            was performed, and patient medications and                            allergies were reviewed. The patient's tolerance of                            previous anesthesia was also reviewed. The risks                            and benefits of the procedure and the sedation                            options and risks were discussed with the patient.                            All questions were answered, and informed consent                            was obtained. Prior Anticoagulants: The patient has                            taken Plavix (clopidogrel), last dose was 2 days                            prior to procedure. ASA Grade Assessment: III - A  patient with severe systemic disease. After                            reviewing the risks and benefits, the patient was                            deemed in satisfactory condition to undergo the                            procedure.                           After obtaining informed consent, the endoscope was                            passed under  direct vision. Throughout the                            procedure, the patient's blood pressure, pulse, and                            oxygen saturations were monitored continuously. The                            EC-[id number] ([id number]) scope was introduced through                            the mouth and advanced to the proximal jejunum. The                            small bowel enteroscopy was accomplished without                            difficulty. The patient tolerated the procedure                            well. Scope In: Scope Out: Findings:      LA Grade C (one or more mucosal breaks continuous between tops of 2 or       more mucosal folds, less than 75% circumference) esophagitis with no       bleeding was found in the lower third of the esophagus. There were       superficial ulcerations seen in the distal esophagus, likely reflux       induced.      A 2 cm hiatal hernia was present.      A single localized, small non-bleeding erosion was found at the pylorus.      The exam of the stomach was otherwise normal.      A single angioectasia with no bleeding was found in the fourth portion       of the duodenum. Fulguration to ablate the lesion to prevent bleeding by       argon plasma using Erbe right colon setting and was successful.       Estimated blood loss was none.      The exam of the duodenum was otherwise normal.  There was no evidence of significant pathology in the proximal jejunum. Impression:               - LA Grade C reflux, acute and erosive esophagitis.                           - 2 cm hiatal hernia.                           - Non-bleeding erosive gastropathy.                           - A single non-bleeding angioectasia in the                            duodenum. Treated with argon plasma coagulation                            (APC).                           - The examined portion of the jejunum was normal.                           - No specimens  collected. Moderate Sedation:      N/A Recommendation:           - Return patient to hospital ward for ongoing care.                           - Advance diet as tolerated.                           - Continue present medications.                           - Anemia is mixed picture, likely chronic disease                            and in part related to chronic kidney disease with                            iron deficiency also contributing. Consider IV iron                            while here. Possibility for deeper small bowel                            angioectatic lesions certainly exists. Would hold                            Plavix for a least a total of 5 days and recommend                            discussion with cardiology about the risk/benefit  of long-term Plavix given her history of slow,                            intermittent GI blood loss and now the need for                            transfusion for symptomatic anemia.                           - Avoid NSAIDs.                           - BID PPI for 6 weeks, then daily.                           - Monitor Hgb Procedure Code(s):        --- Professional ---                           [id number], Small intestinal endoscopy, enteroscopy                            beyond second portion of duodenum, not including                            ileum; with control of bleeding (eg, injection,                            bipolar cautery, unipolar cautery, laser, heater                            probe, stapler, plasma coagulator) Diagnosis Code(s):        --- Professional ---                           K21.0, Gastro-esophageal reflux disease with                            esophagitis                           K20.8, Other esophagitis                           K44.9, Diaphragmatic hernia without obstruction or                            gangrene                           K31.89, Other diseases of stomach and  duodenum                           K31.819, Angiodysplasia of stomach and duodenum                            without bleeding  D50.9, Iron deficiency anemia, unspecified                           R19.5, Other fecal abnormalities CPT copyright 2016 [employer] Association. All rights reserved. The codes documented in this report are preliminary and upon coder review may  be revised to meet current compliance requirements. Jerene Bears, MD 06/22/2016 11:54:22 AM This report has been signed electronically. Number of Addenda: 0

## 2016-06-22 NOTE — Progress Notes (Signed)
Family Medicine Teaching Service Daily Progress Note Intern Pager: [phone]  Patient name: [name] Medical record number: [id number] Date of birth: 1950-09-20 Age: 66 y.o. Gender: female  Primary Care Provider: Arnoldo Morale, MD Consultants: GI, cardiology Code Status: DNR  Pt Overview and Major Events to Date:  5/1 admitted with symptomatic anemia and possible GI bleed, also fluid overload 5/2 EGD scheduled  Assessment and Plan: [name] is a 66 y.o. female presenting with vomiting and diarrhea x 1 week and fatique. PMH is significant for diet controlled DMII, CKD IV (baseline Cr appears to be 2.5) 2/2 to renal artery stenosis followed by CKA, diastolic CHF (EF 61-44% last echo 03/2015), HTN, COPD (no home O2).   Anemia: concern for GI bleed, GI following, likely EGD today. S/p 2U PRBC with Hgb from 5.2 > 7.0 > 8.3. FOBT + brown stool on GI exam. -GI consult, anticipate EGD this am -IV protonix 80mg  q12H  -CBCs q12H  -cardiac monitoring  Abdominal pain, resolved: TTP over bilateral lower quadrants and suprapubic region on admission. Possible etiologies include mesenteric ischemia, diverticulosis, UTI. Lactic acid WNL.  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 Florene Glen. Baseline Cr recently appears to be 2.5-2.8. Cr 4.05 on admission > 3.67 after gentle hydration with PRBCs.  -consider consulting nephrology  Elevated troponin in setting of significant CAD: patient with history of diastolic CHF and known CAD with severe three vessel CAD (01/2015) as well as cardiomegaly on CXR. Etiology demand ischemia, ACS less likely 2/2 no STEMI or CP. EKG persistently bradycardic. Trop 0.39>0.49>0.60.  -cardiology consulted, agreed with management -Holding plavix for now given possibly acute GI bleed   Hypokalemia: likely 2/2 vomiting and diarrhea. K 3.2.  -replete with Kdur 73meQ x1 dose given AKI  Bradycardia: HR at last 3 OP visits over  the past year has been 60s-80s.  -cardiac monitoring -holding all home PO meds, including nitrates, BB  CHF: given AoCKD and transaminitis with possible congestive hepatopathy, concerned for fluid overload despite minimal edema on exam. Last echo 04/13/2015 with EF 55-60%, moderate mitral regurgitation noted. BNP >4,500.  -repeat echo -consider HF consult pending cards recs  -consider IV diuresis after EGD - In/outs and daily weights   Transaminitis: Most likely congestive hepatopathy vs shock liver. Hep panel negative, Hepatitis B surface antibody negative. acetaminophen level <10. Some improvement with transfusion.  -will need Hep B vaccination as an outpatient -continue to monitor CMPs daily -HIV in process  Dementia: PCP notes suggest Lewy Body dementia. History of CVA in 2010. -holding aricept 2/2 NPO  DMII: diet controlled per PCP notes. Last HgbA1c 06/15/16 of 6.1 -NPO pending GI eval  Tobacco Abuse: 1 ppd x many years, briefly quit in the recent past, back to smoking 1ppd now - Consider a nictoine patch in the future- hold off for now   Protein calorie malnutrition: albumin 2.4. Poor PO intake recently.  -consider nutrition consult once taking PO  COPD/pulmonary nodule/pulmonary HTN: Followed by pulmonology for COPD and several pulmonary nodules along the middle mediastinal/hilar adenopathy. Currently on 1 L [place]. Last ECHO noted a pulmonary peak pressure of 62 mmHG - Continue Dulera  - PRN albuterol   FEN/GI: NPO/IV PPI Prophylaxis: no DVT prophylaxis as c/w acute bleed  Disposition: continued management in stepdown   Subjective:  Patient feels well this morning, no more V/D or abdominal pain. Understands EGD planned for today. Family not present in the room.  Objective: Temp:  [97.7 F (36.5 C)-98.7 F (37.1 C)] 98 F (36.7 C) (05/02 0445) Pulse Rate:  [50-62] 61 (05/02 0445) Resp:  [10-16] 16 (05/02 0445) BP: (89-122)/(42-61) 122/49 (05/02 0445) SpO2:   [91 %-100 %] 95 % (05/02 0445) Weight:  [124 lb 9.6 oz (56.5 kg)-128 lb 12 oz (58.4 kg)] 124 lb 9.6 oz (56.5 kg) (05/02 0445) Physical Exam: General: pale female resting in bed in NAD. Cardiovascular: bradycardic, distant heart sounds, no murmur Respiratory: faint bibasilar crackles, easy WOB, on 1L [place] Abdomen: nontender to palpation, +BS Extremities: trace bilateral LE edema, warm.   Laboratory:  Recent Labs Lab 06/21/16 1030 06/21/16 2344 06/22/16 0236  WBC 8.9 9.5 9.3  HGB 5.2* 7.9* 8.3*  HCT 17.2* 24.8* 26.1*  PLT 219 158 145*    Recent Labs Lab 06/21/16 1030 06/22/16 0236  NA 133* 136  K 3.8 3.2*  CL 97* 101  CO2 21* 23  BUN 97* 90*  CREATININE 4.05* 3.67*  CALCIUM 8.2* 8.0*  PROT 5.1* 5.2*  BILITOT 0.9 1.2  ALKPHOS 107 89  ALT 275* 238*  AST 352* 252*  GLUCOSE 174* 133*    Lactic acid WNL.  Imaging/Diagnostic Tests: US Abdomen Limited  Result Date: 06/21/2016 CLINICAL DATA:  Inpatient. Nausea and vomiting. Transaminitis. History of cholecystectomy. EXAM: US ABDOMEN LIMITED - RIGHT UPPER QUADRANT COMPARISON:  09/07/2012 CT abdomen/pelvis. FINDINGS: Gallbladder: Surgically absent. Common bile duct: Diameter: 6 mm. Liver: Liver parenchyma is diffusely mildly echogenic with mild posterior acoustic attenuation, compatible with mild diffuse hepatic steatosis. No definite liver surface irregularity. No liver mass detected, noting decreased sensitivity in the setting of an echogenic liver. IMPRESSION: 1. Bile ducts are within expected post cholecystectomy limits, with common bile duct diameter 6 mm . 2. Mild diffuse hepatic steatosis. Electronically Signed   By: Ilona Sorrel M.D.   On: 06/21/2016 16:13   Dg Chest Portable 1 View  Result Date: 06/21/2016 CLINICAL DATA:  Anemia and shortness of breath EXAM: PORTABLE CHEST 1 VIEW COMPARISON:  April 04, 2015 and chest CT June 01, 2016 FINDINGS: There is no edema or consolidation. Scattered nodular opacities noted on  recent chest CT are not appreciable by radiography. There is cardiomegaly with pulmonary vascular within normal limits. No adenopathy. There is aortic atherosclerosis. No bone lesions. IMPRESSION: No edema or consolidation. Nodular opacity seen on recent chest CT are not apparent by radiography. Stable cardiomegaly. There is aortic atherosclerosis. Electronically Signed   By: Lowella Grip III M.D.   On: 06/21/2016 12:34    Sela Hilding, MD 06/22/2016, 7:04 AM PGY-1, [place] Intern pager: [phone], text pages welcome

## 2016-06-22 NOTE — Anesthesia Procedure Notes (Signed)
Procedure Name: MAC Performed by: Suzana Sohail P Pre-anesthesia Checklist: Patient identified, Emergency Drugs available, Suction available, Patient being monitored and Timeout performed Patient Re-evaluated:Patient Re-evaluated prior to inductionOxygen Delivery Method: Nasal cannula Intubation Type: IV induction Placement Confirmation: positive ETCO2 Dental Injury: Teeth and Oropharynx as per pre-operative assessment        

## 2016-06-22 NOTE — Progress Notes (Signed)
Attempted to get verbal consent for enteroscopy over the phone from patient's daughter.  No answer, message left on daughters voicemail.

## 2016-06-22 NOTE — Progress Notes (Signed)
Potassium 3.2, Troponin's up 0.49. Notified on call provider.

## 2016-06-22 NOTE — Progress Notes (Signed)
OT Cancellation Note  Patient Details Name: [name] MRN: [id number] DOB: 02-Mar-1950   Cancelled Treatment:    Reason Eval/Treat Not Completed: Patient at procedure or test/ unavailable. Will follow up for OT eval as time allows.  Binnie Kand M.S., OTR/L Pager: [phone]  06/22/2016, 11:58 AM

## 2016-06-23 DIAGNOSIS — E876 Hypokalemia: Secondary | ICD-10-CM

## 2016-06-23 DIAGNOSIS — N179 Acute kidney failure, unspecified: Secondary | ICD-10-CM

## 2016-06-23 DIAGNOSIS — I2510 Atherosclerotic heart disease of native coronary artery without angina pectoris: Secondary | ICD-10-CM

## 2016-06-23 DIAGNOSIS — I5043 Acute on chronic combined systolic (congestive) and diastolic (congestive) heart failure: Secondary | ICD-10-CM

## 2016-06-23 DIAGNOSIS — N189 Chronic kidney disease, unspecified: Secondary | ICD-10-CM

## 2016-06-23 LAB — CBC
HEMATOCRIT: 26.6 % — AB (ref 36.0–46.0)
HEMOGLOBIN: 8.3 g/dL — AB (ref 12.0–15.0)
MCH: 24.6 pg — ABNORMAL LOW (ref 26.0–34.0)
MCHC: 31.2 g/dL (ref 30.0–36.0)
MCV: 78.7 fL (ref 78.0–100.0)
Platelets: 127 10*3/uL — ABNORMAL LOW (ref 150–400)
RBC: 3.38 MIL/uL — ABNORMAL LOW (ref 3.87–5.11)
RDW: 17.4 % — AB (ref 11.5–15.5)
WBC: 9.4 10*3/uL (ref 4.0–10.5)

## 2016-06-23 LAB — URINALYSIS, ROUTINE W REFLEX MICROSCOPIC
Bilirubin Urine: NEGATIVE
GLUCOSE, UA: NEGATIVE mg/dL
Ketones, ur: NEGATIVE mg/dL
NITRITE: NEGATIVE
PH: 5 (ref 5.0–8.0)
Protein, ur: 100 mg/dL — AB
SPECIFIC GRAVITY, URINE: 1.01 (ref 1.005–1.030)

## 2016-06-23 LAB — COMPREHENSIVE METABOLIC PANEL
ALT: 165 U/L — AB (ref 14–54)
ANION GAP: 11 (ref 5–15)
AST: 113 U/L — ABNORMAL HIGH (ref 15–41)
Albumin: 2 g/dL — ABNORMAL LOW (ref 3.5–5.0)
Alkaline Phosphatase: 91 U/L (ref 38–126)
BUN: 71 mg/dL — ABNORMAL HIGH (ref 6–20)
CHLORIDE: 102 mmol/L (ref 101–111)
CO2: 23 mmol/L (ref 22–32)
CREATININE: 3.11 mg/dL — AB (ref 0.44–1.00)
Calcium: 7.6 mg/dL — ABNORMAL LOW (ref 8.9–10.3)
GFR calc Af Amer: 17 mL/min — ABNORMAL LOW (ref >60)
GFR calc non Af Amer: 15 mL/min — ABNORMAL LOW (ref >60)
Glucose, Bld: 90 mg/dL (ref 65–99)
POTASSIUM: 2.8 mmol/L — AB (ref 3.5–5.1)
SODIUM: 136 mmol/L (ref 135–145)
Total Bilirubin: 0.9 mg/dL (ref 0.3–1.2)
Total Protein: 4.9 g/dL — ABNORMAL LOW (ref 6.5–8.1)

## 2016-06-23 LAB — GLUCOSE, CAPILLARY
GLUCOSE-CAPILLARY: 155 mg/dL — AB (ref 65–99)
Glucose-Capillary: 100 mg/dL — ABNORMAL HIGH (ref 65–99)
Glucose-Capillary: 128 mg/dL — ABNORMAL HIGH (ref 65–99)
Glucose-Capillary: 137 mg/dL — ABNORMAL HIGH (ref 65–99)

## 2016-06-23 LAB — HEPATITIS B SURFACE ANTIBODY,QUALITATIVE: Hep B S Ab: NONREACTIVE

## 2016-06-23 LAB — MAGNESIUM: Magnesium: 1.7 mg/dL (ref 1.7–2.4)

## 2016-06-23 MED ORDER — CALCITRIOL 0.5 MCG PO CAPS
0.5000 ug | ORAL_CAPSULE | Freq: Every day | ORAL | Status: DC
  Administered 2016-06-23 – 2016-06-24 (×2): 0.5 ug via ORAL
  Filled 2016-06-23 (×2): qty 1

## 2016-06-23 MED ORDER — CALCIUM CARBONATE 1250 (500 CA) MG PO TABS
1.0000 | ORAL_TABLET | Freq: Three times a day (TID) | ORAL | Status: DC
  Administered 2016-06-23 – 2016-06-24 (×4): 500 mg via ORAL
  Filled 2016-06-23 (×4): qty 1

## 2016-06-23 MED ORDER — OMEGA-3 FATTY ACIDS 1000 MG PO CAPS: 1.0000 g | ORAL_CAPSULE | Freq: Three times a day (TID) | ORAL | Status: DC

## 2016-06-23 MED ORDER — SERTRALINE HCL 50 MG PO TABS
50.0000 mg | ORAL_TABLET | Freq: Every day | ORAL | Status: DC
  Administered 2016-06-23 – 2016-06-24 (×2): 50 mg via ORAL
  Filled 2016-06-23 (×2): qty 1

## 2016-06-23 MED ORDER — CARVEDILOL 3.125 MG PO TABS
3.1250 mg | ORAL_TABLET | Freq: Two times a day (BID) | ORAL | Status: DC
  Administered 2016-06-23 – 2016-06-24 (×2): 3.125 mg via ORAL
  Filled 2016-06-23 (×2): qty 1

## 2016-06-23 MED ORDER — FUROSEMIDE 10 MG/ML IJ SOLN
40.0000 mg | Freq: Two times a day (BID) | INTRAMUSCULAR | Status: DC
  Administered 2016-06-23: 40 mg via INTRAVENOUS
  Filled 2016-06-23 (×2): qty 4

## 2016-06-23 MED ORDER — FERUMOXYTOL INJECTION 510 MG/17 ML
510.0000 mg | INTRAVENOUS | Status: DC
  Administered 2016-06-23: 510 mg via INTRAVENOUS
  Filled 2016-06-23: qty 17

## 2016-06-23 MED ORDER — OMEGA-3-ACID ETHYL ESTERS 1 G PO CAPS
1.0000 g | ORAL_CAPSULE | Freq: Three times a day (TID) | ORAL | Status: DC
  Administered 2016-06-23 – 2016-06-24 (×4): 1 g via ORAL
  Filled 2016-06-23 (×4): qty 1

## 2016-06-23 MED ORDER — POTASSIUM CHLORIDE CRYS ER 20 MEQ PO TBCR
40.0000 meq | EXTENDED_RELEASE_TABLET | ORAL | Status: AC
  Administered 2016-06-23 (×2): 40 meq via ORAL
  Filled 2016-06-23 (×2): qty 2

## 2016-06-23 MED ORDER — DONEPEZIL HCL 10 MG PO TABS
10.0000 mg | ORAL_TABLET | Freq: Every day | ORAL | Status: DC
  Administered 2016-06-23: 10 mg via ORAL
  Filled 2016-06-23: qty 1

## 2016-06-23 NOTE — Progress Notes (Signed)
Family Medicine Teaching Service Daily Progress Note Intern Pager: [phone]  Patient name: [name] Medical record number: [id number] Date of birth: 05/15/1950 Age: 66 y.o. Gender: female  Primary Care Provider: Arnoldo Morale, MD Consultants: GI, cardiology Code Status: DNR  Pt Overview and Major Events to Date:  5/1 admitted with symptomatic anemia and possible GI bleed, also fluid overload 5/2 EGD > several erosion, angioectasia at distal duodenum was frozen  Assessment and Plan: [name] is a 66 y.o. female presenting with vomiting and diarrhea x 1 week and fatique. PMH is significant for diet controlled DMII, CKD IV (baseline Cr appears to be 2.5) 2/2 to renal artery stenosis followed by CKA, diastolic CHF (EF 96-75% last echo 03/2015), HTN, COPD (no home O2).   Anemia:likely combination of slow GI bleed and iron deficiency. S/p 2U PRBC with Hgb from 5.2 > 7.0 > 8.3. FOBT + brown stool on GI exam. -GI consult, EGD with esophagitis, several erosions, and distal duodenal angioectasia that was frozen -BID protonix 40mg  PO x 6 weeks then daily -CBCs daily -cardiac monitoring  Abdominal pain, resolved: TTP over bilateral lower quadrants and suprapubic region on admission. Possible etiologies include mesenteric ischemia, diverticulosis, UTI. Lactic acid WNL.  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 Florene Glen. Baseline Cr recently appears to be 2.5-2.8. Cr 4.05 on admission > 3.67 > 3.11.  -continue to monitor daily CMPs  Elevated troponin in setting of significant CAD: patient with history of diastolic CHF and known CAD with severe three vessel CAD (01/2015) as well as cardiomegaly on CXR. Etiology demand ischemia, ACS less likely 2/2 no STEMI or CP. EKG persistently bradycardic. Trop 0.39>0.49>0.57.  -cardiology consulted, will comment on plavix today -Holding plavix for now per GI recs x 5 days  Hypokalemia: likely 2/2 vomiting and  diarrhea. K 2.8.  -replete with Kdur 67meQ x1 dose given AKI  Bradycardia: HR at last 3 OP visits over the past year has been 60s-80s.  -cardiac monitoring -holding home BB and procardia  CHF: given AoCKD and transaminitis with possible congestive hepatopathy, concerned for fluid overload despite minimal edema on exam. Last echo 04/13/2015 with EF 55-60%, moderate mitral regurgitation noted. Repeat echo this admission with EF 40-45% and moderate regurg. BNP >4,500. Weight up 2lbs since admission. UOP 500cc overnight. -cards added 20mg  lasix IV BID - In/outs and daily weights   Transaminitis, improving: Most likely congestive hepatopathy vs shock liver. Hep panel negative, Hepatitis B surface antibody negative. acetaminophen level <10. Some improvement with transfusion. HIV nonreactive. -Hep B surface antibody in process -continue to monitor CMPs daily  Dementia: PCP notes suggest Lewy Body dementia. History of CVA in 2010. -restart aricept  DMII: diet controlled per PCP notes. Last HgbA1c 06/15/16 of 6.1.  -CBGs daily  Tobacco Abuse: 1 ppd x many years, briefly quit in the recent past, back to smoking 1ppd now - Consider a nictoine patch if needed   Protein calorie malnutrition: albumin 2.4. Poor PO intake recently.  -nutrition consult   COPD/pulmonary nodule/pulmonary HTN: Followed by pulmonology for COPD and several pulmonary nodules along the middle mediastinal/hilar adenopathy. Currently on 1 L [place]. Last ECHO noted a pulmonary peak pressure of 62 mmHG - Continue Dulera  - PRN albuterol   FEN/GI: NPO/IV PPI Prophylaxis: no DVT prophylaxis as c/w acute bleed  Disposition: transfer to floor  Subjective:  Patient feels better today, she denies abdominal pain. Endorses more frequent urination after lasix last  night.   Objective: Temp:  [97.6 F (36.4 C)-98.6 F (37 C)] 98.1 F (36.7 C) (05/03 0335) Pulse Rate:  [48-60] 55 (05/03 0335) Resp:  [9-22] 18 (05/03  0335) BP: (117-150)/(29-111) 150/42 (05/03 0335) SpO2:  [95 %-100 %] 100 % (05/03 0335) Weight:  [130 lb 1.6 oz (59 kg)] 130 lb 1.6 oz (59 kg) (05/03 0335) Physical Exam: General: pale female resting in bed in NAD. Cardiovascular: bradycardic, distant heart sounds, no murmur Respiratory: faint bibasilar crackles, easy WOB, on 1L [place] Abdomen: nontender to palpation, +BS Extremities: trace bilateral LE edema, warm.   Laboratory:  Recent Labs Lab 06/21/16 2344 06/22/16 0236 06/22/16 1937  WBC 9.5 9.3 8.7  HGB 7.9* 8.3* 8.3*  HCT 24.8* 26.1* 26.6*  PLT 158 145* 138*    Recent Labs Lab 06/21/16 1030 06/22/16 0236 06/23/16 0248  NA 133* 136 136  K 3.8 3.2* 2.8*  CL 97* 101 102  CO2 21* 23 23  BUN 97* 90* 71*  CREATININE 4.05* 3.67* 3.11*  CALCIUM 8.2* 8.0* 7.6*  PROT 5.1* 5.2* 4.9*  BILITOT 0.9 1.2 0.9  ALKPHOS 107 89 91  ALT 275* 238* 165*  AST 352* 252* 113*  GLUCOSE 174* 133* 90    Lactic acid WNL.  Imaging/Diagnostic Tests: No results found.  Sela Hilding, MD 06/23/2016, 7:07 AM PGY-1, [place] Intern pager: [phone], text pages welcome

## 2016-06-23 NOTE — Progress Notes (Signed)
Inpatient Diabetes Program Recommendations  AACE/ADA: New Consensus Statement on Inpatient Glycemic Control (2015)  Target Ranges:  Prepandial:   less than 140 mg/dL      Peak postprandial:   less than 180 mg/dL (1-2 hours)      Critically ill patients:  140 - 180 mg/dL   Lab Results  Component Value Date   GLUCAP 100 (H) 06/23/2016   HGBA1C 6.1 06/15/2016    Review of Glycemic Control:  Results for CASSADY, STANCZAK (MRN [id number]) as of 06/23/2016 10:15  Ref. Range 06/22/2016 07:45 06/22/2016 12:35 06/22/2016 16:14 06/22/2016 21:02 06/23/2016 07:35  Glucose-Capillary Latest Ref Range: 65 - 99 mg/dL 115 (H) 124 (H) 269 (H) 128 (H) 100 (H)   Inpatient Diabetes Program Recommendations:    If appropriate, consider adding Novolog sensitive correction tid with meals and HS.  Thanks Adah Perl, RN, BC-ADM Inpatient Diabetes Coordinator Pager [phone] (8a-5p)

## 2016-06-23 NOTE — Plan of Care (Signed)
Problem: Skin Integrity: Goal: Risk for impaired skin integrity will decrease Outcome: Not Progressing Has periods of incontinence at night. Currently had red fungal rash under fold of Right breast. Cleaned and used powder. Also performed peri care and used barrier cream. Pt reports some relief.

## 2016-06-23 NOTE — Evaluation (Signed)
Occupational Therapy Evaluation Patient Details Name: [name] MRN: [id number] DOB: [birth year] Today's Date: 06/23/2016    History of Present Illness 66 Y/O F with PMX of upper GI bleed, RAS, HTN, HLD, DM2, Dementia, COPD, CKD, CAD presented with one-week hx of N/V associated with generalized abdominal pain and diarrhea. She endorsed blood in her stool in the last few days but none today. She came in today because she was feeling weak and tired. No fall or LOC. She denies chest pain, no SOB, no diaphoresis. Pt was admitted w/ dx: Acute Anemia from GI bleed and N/V and diarrhea.   Clinical Impression   Pt admitted as above currently presenting with deficits in her ability to perform ADL and self care tasks (See OT problem list below). She is currently Mod A LB ADL's and Max A hygiene. Mod A SPT from EOB to recliner chair.Pt should benefit from acute OT to assist in maximizing independence with ADL's followed by [facility] to assess for safety and recommendations for home setting PRN.    Follow Up Recommendations  Home health OT;Supervision/Assistance - 24 hour    Equipment Recommendations  Other (comment) (Cont to assess, no family present during Eval)    Recommendations for Other Services       Precautions / Restrictions Precautions Precautions: Fall;Other (comment) (DNR) Restrictions Weight Bearing Restrictions: No      Mobility Bed Mobility Overal bed mobility: Needs Assistance Bed Mobility: Sidelying to Sit;Supine to Sit   Sidelying to sit: Min assist;HOB elevated Supine to sit: Min guard        Transfers Overall transfer level: Needs assistance Equipment used: None (Gait belt for SPT from EOB to chair - pt appeared confused with RW) Transfers: Sit to/from [facility] Sit to Stand: Mod assist Stand pivot transfers: Mod assist       General transfer comment: VC's for safety and sequencing, used gait belt for needed support    Balance Overall balance  assessment: Needs assistance Sitting-balance support: Single extremity supported;Feet unsupported Sitting balance-Leahy Scale: Fair                                     ADL either performed or assessed with clinical judgement   ADL Overall ADL's : Needs assistance/impaired Eating/Feeding: Set up;Bed level   Grooming: Wash/dry hands;Wash/dry face;Set up;Supervision/safety;Sitting Grooming Details (indicate cue type and reason): Sitting EOB for grooming Upper Body Bathing: Minimal assistance;Sitting   Lower Body Bathing: Moderate assistance;Sit to/from stand   Upper Body Dressing : Minimal assistance;Sitting   Lower Body Dressing: Moderate assistance;Sit to/from stand   Toilet Transfer: Moderate assistance;Stand-pivot;BSC (Simulated toilet transfer from EOB to recliner 2* leads/wires and O2)   Toileting- Clothing Manipulation and Hygiene: Maximal assistance;Sit to/from stand       Functional mobility during ADLs: Moderate assistance (SPT using gait belt from EOB to recliner chair) General ADL Comments: Pt was evaluated by OT and educated in Role of OT. She has h/o dementia, no family present to confirm prior level of assist with ADL's noted. Pt was agreeable to ADL retraining session to consist of sitting EOB, grooming and functional mobility. She is currently Mod A LB ADL's and Max A hygiene. Pt washed her face and hands while sitting EOB after set-up and was assisted to change her gown. Pt should benefit from acute OT followed by [facility] to assess for safety and recommendations PRN.  Vision  Pt states that she wears glasses for watching TV and reading. Pt was not wearing glasses upon OT arrival. Glasses were cleaned off by OT and pt put on at conclusion of session.       Perception     Praxis      Pertinent Vitals/Pain Pain Assessment: No/denies pain     Hand Dominance Left   Extremity/Trunk Assessment Upper Extremity Assessment Upper Extremity Assessment:  Generalized weakness   Lower Extremity Assessment Lower Extremity Assessment: Defer to PT evaluation       Communication Communication Communication: No difficulties   Cognition Arousal/Alertness: Awake/alert Behavior During Therapy: Flat affect Overall Cognitive Status: Within Functional Limits for tasks assessed                                 General Comments: PMH Dementia noted   General Comments       Exercises     Shoulder Instructions      Home Living Family/patient expects to be discharged to:: Private residence Living Arrangements: Spouse/significant other Available Help at Discharge: Family;Available 24 hours/day Type of Home: Mobile home Home Access: Stairs to enter Entrance Stairs-Number of Steps: 2 Entrance Stairs-Rails: Can reach both;Right;Left Home Layout: One level     Bathroom Shower/Tub: Occupational psychologist: Standard     Home Equipment: Environmental consultant - 2 wheels;Cane - quad;Shower seat   Additional Comments: Uses O2 at home at [facility] and PRN during the day. Ambulates with RW per pt report      Prior Functioning/Environment Level of Independence: Needs assistance  Gait / Transfers Assistance Needed: Uses RW for ambulation ADL's / Homemaking Assistance Needed: Pt daughter does cooking and assists with grocery shopping, driving. Husband assists with cleaning, bathing and dressing etc. Communication / Swallowing Assistance Needed: No difficulties Comments: No famioly present; Note PMH includes dementia        OT Problem List: Decreased strength;Impaired balance (sitting and/or standing);Decreased cognition;Decreased knowledge of precautions;Decreased safety awareness;Cardiopulmonary status limiting activity;Decreased activity tolerance;Decreased coordination;Decreased knowledge of use of DME or AE      OT Treatment/Interventions: Self-care/ADL training;DME and/or AE instruction;Therapeutic activities;Energy  conservation;Patient/family education;Therapeutic exercise    OT Goals(Current goals can be found in the care plan section) Acute Rehab OT Goals Patient Stated Goal: To eat something OT Goal Formulation: Patient unable to participate in goal setting Time For Goal Achievement: 07/07/16 Potential to Achieve Goals: Good  OT Frequency: Min 2X/week   Barriers to D/C:            Co-evaluation              AM-PAC PT "6 Clicks" Daily Activity     Outcome Measure Help from another person eating meals?: None Help from another person taking care of personal grooming?: A Little Help from another person toileting, which includes using toliet, bedpan, or urinal?: A Lot Help from another person bathing (including washing, rinsing, drying)?: A Lot Help from another person to put on and taking off regular upper body clothing?: A Little Help from another person to put on and taking off regular lower body clothing?: A Lot 6 Click Score: 16   End of Session Equipment Utilized During Treatment: Gait belt;Oxygen (Attempted sit to stand w/ RW, however, pt appeared confused.)  Activity Tolerance: Patient tolerated treatment well Patient left: in chair;with call bell/phone within reach  OT Visit Diagnosis: Unsteadiness on feet (R26.81);Muscle weakness (generalized) (M62.81);Other  symptoms and signs involving cognitive function;Other (comment) (Cardiac issues)                Time: 8485-9276 OT Time Calculation (min): 39 min Charges:  OT General Charges $OT Visit: 1 Procedure OT Evaluation $OT Eval Moderate Complexity: 1 Procedure OT Treatments $Self Care/Home Management : 23-37 mins G-Codes: OT G-codes **NOT FOR INPATIENT CLASS** Functional Assessment Tool Used: AM-PAC 6 Clicks Daily Activity;Clinical judgement Functional Limitation: Self care Self Care Current Status (F9432): At least 40 percent but less than 60 percent impaired, limited or restricted Self Care Goal Status (W0379): At least  20 percent but less than 40 percent impaired, limited or restricted   Percell Miller, OTR/L 06/23/16 10:12 AM   Domnique Vantine Beth Dixon 06/23/2016, 10:08 AM

## 2016-06-23 NOTE — Progress Notes (Addendum)
Daily Rounding Note  06/23/2016, 8:17 AM  LOS: 2 days   SUBJECTIVE:   Chief complaint:  Weakness: improved.     Dark stool yesterday after EGD, no BM today.  Denies abd or other pain.  No SOB.  No N/V.  OBJECTIVE:         Vital signs in last 24 hours:    Temp:  [97.6 F (36.4 C)-98.6 F (37 C)] 98.1 F (36.7 C) (05/03 0335) Pulse Rate:  [48-60] 55 (05/03 0335) Resp:  [9-22] 18 (05/03 0335) BP: (117-150)/(29-111) 150/42 (05/03 0335) SpO2:  [95 %-100 %] 100 % (05/03 0335) Weight:  [59 kg (130 lb 1.6 oz)] 59 kg (130 lb 1.6 oz) (05/03 0335) Last BM Date: 06/22/16 Filed Weights   06/21/16 1530 06/22/16 0445 06/23/16 0335  Weight: 58.4 kg (128 lb 12 oz) 56.5 kg (124 lb 9.6 oz) 59 kg (130 lb 1.6 oz)   General: pleasant, fully alert, comfortable.  Looks better   Heart: RRR.  Soft 1/6 systolic murmer.  Chest: some crackles in right base Abdomen: soft, NT, ND.  Active BS  Extremities: no CCE Neuro/Psych:  Oriented x 3.  Appropriate.  No gross deficits.  Intake/Output from previous day: 05/02 0701 - 05/03 0700 In: 1000 [P.O.:600; I.V.:400] Out: 500 [Urine:500]  Intake/Output this shift: Total I/O In: -  Out: 400 [Urine:400]  Lab Results:  Recent Labs  06/21/16 2344 06/22/16 0236 06/22/16 1937  WBC 9.5 9.3 8.7  HGB 7.9* 8.3* 8.3*  HCT 24.8* 26.1* 26.6*  PLT 158 145* 138*   BMET  Recent Labs  06/21/16 1030 06/22/16 0236 06/23/16 0248  NA 133* 136 136  K 3.8 3.2* 2.8*  CL 97* 101 102  CO2 21* 23 23  GLUCOSE 174* 133* 90  BUN 97* 90* 71*  CREATININE 4.05* 3.67* 3.11*  CALCIUM 8.2* 8.0* 7.6*   LFT  Recent Labs  06/21/16 1030 06/22/16 0236 06/23/16 0248  PROT 5.1* 5.2* 4.9*  ALBUMIN 2.4* 2.3* 2.0*  AST 352* 252* 113*  ALT 275* 238* 165*  ALKPHOS 107 89 91  BILITOT 0.9 1.2 0.9   PT/INR  Recent Labs  06/21/16 1624 06/22/16 0947  LABPROT 16.9* 16.8*  INR 1.36 1.35   Hepatitis  Panel  Recent Labs  06/21/16 1624  HEPBSAG Negative  HCVAB <0.1  HEPAIGM Negative  HEPBIGM Negative    Studies/Results: US Abdomen Limited  Result Date: 06/21/2016 CLINICAL DATA:  Inpatient. Nausea and vomiting. Transaminitis. History of cholecystectomy. EXAM: US ABDOMEN LIMITED - RIGHT UPPER QUADRANT COMPARISON:  09/07/2012 CT abdomen/pelvis. FINDINGS: Gallbladder: Surgically absent. Common bile duct: Diameter: 6 mm. Liver: Liver parenchyma is diffusely mildly echogenic with mild posterior acoustic attenuation, compatible with mild diffuse hepatic steatosis. No definite liver surface irregularity. No liver mass detected, noting decreased sensitivity in the setting of an echogenic liver. IMPRESSION: 1. Bile ducts are within expected post cholecystectomy limits, with common bile duct diameter 6 mm . 2. Mild diffuse hepatic steatosis. Electronically Signed   By: Ilona Sorrel M.D.   On: 06/21/2016 16:13   Dg Chest Portable 1 View  Result Date: 06/21/2016 CLINICAL DATA:  Anemia and shortness of breath EXAM: PORTABLE CHEST 1 VIEW COMPARISON:  April 04, 2015 and chest CT June 01, 2016 FINDINGS: There is no edema or consolidation. Scattered nodular opacities noted on recent chest CT are not appreciable by radiography. There is cardiomegaly with pulmonary vascular within normal limits. No adenopathy. There is aortic  atherosclerosis. No bone lesions. IMPRESSION: No edema or consolidation. Nodular opacity seen on recent chest CT are not apparent by radiography. Stable cardiomegaly. There is aortic atherosclerosis. Electronically Signed   By: Lowella Grip III M.D.   On: 06/21/2016 12:34    ASSESMENT:   *  Dark emesis and stools, FOBT+.  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  These lesions could be intermittently bleeding in setting of Plavix.  No PPI PTA.    *  Anemia, microcytic, acute on chronic, despite BID po iron.   Multifactorial.  s/p PRBC x  Late stage CKD and erythropoetin deficit certainly playing a role  *  Stage 4 CKD on "renal" meds: rocalcitrol, Ca carbonate.  From d/w pt do not think she is receiving epo/procrit.   *  Elevated transaminases, due to hepatic hypoperfusion "shock liver".  No biliary sxs.  Mild fatty liver on [place].    *  Elevated Troponins.  Likely demand ischemia.  Renal fx too tenuous for cath, but known severe multivessel disease as well as extensive PAD.  Permanent discontinuation of Plavix likely to add risk of obstructing what coronary flow she has.  Still smoking 1 PPD.     *  Non-critical thrombocytopenia.  *  Hypokalemia.     PLAN   *  PPI BID for 6 weeks, then drop to daily and never stop.   *  Advance to HH/CM diet  *  ? Time to initiate epo?     *   Pharmacy guidance for parenteral iron infusion.  Placed request  and orders for 2 doses placed within 40 minutes! Thank you Sharilyn Sites PharmD.      *  How long to hold Plavix?  *  Defer potassium supplementation to teaching service.    Azucena Freed  06/23/2016, 8:17 AM Pager: [phone]  I have taken an interval history, reviewed the chart, and examined the patient. I agree with the Advanced Practitioner's note and impression. Dr. Oval Linsey okay with holding Plavix indefinitely.  I expect this will help significantly with the iron deficiency portion of her multifactorial anemia. Getting IV iron BID PPI x 6 weeks then daily Okay to resume low dose ASA (enteric coated if possible) tomorrow if Hgb stable. LFTs improving and recent elevation felt secondary to shock liver in setting of significant, now improved, anemia GI will be available, call with questions

## 2016-06-23 NOTE — Progress Notes (Signed)
Nutrition Brief Note  Received MD consult for assessment of nutrition status.   Wt Readings from Last 10 Encounters:  06/23/16 130 lb 1.6 oz (59 kg)  06/15/16 128 lb (58.1 kg)  03/04/16 124 lb 6.4 oz (56.4 kg)  12/23/15 122 lb 3.2 oz (55.4 kg)  08/11/15 134 lb 12.8 oz (61.1 kg)  05/28/15 122 lb 8 oz (55.6 kg)  05/20/15 120 lb 12.8 oz (54.8 kg)  05/13/15 126 lb (57.2 kg)  04/29/15 122 lb (55.3 kg)  04/21/15 132 lb 3.2 oz (60 kg)    Body mass index is 26.28 kg/m. Patient meets criteria for overweight based on current BMI.   Patient reports no weight loss. No significant weight loss noted in review of usual weights above. Patient says that her weight fluctuates between ~120 and 130 lbs. She was eating well with good appetite PTA  Current diet order is heart healthy/CHO modified, patient is consuming approximately 75% of meals at this time which is adequate to meet patient's nutrition needs.  Nutrition focused physical exam completed.  No muscle or subcutaneous fat depletion noticed.  Labs and medications reviewed. Albumin 2.0 (L).  Low albumin or prealbumin is no longer used to diagnose malnutrition and are a poor indicator of nutrition status. These labs are reflective of inflammatory process, acute stress response, and levels are highly affected (decreased) by volume overload. [place] uses the new malnutrition guidelines published by the American Society for Parenteral and Enteral Nutrition (A.S.P.E.N.) and the Academy of Nutrition and Dietetics (AND).   No nutrition interventions warranted at this time. If nutrition issues arise, please consult RD.   Molli Barrows, RD, LDN, [place] Pager [phone] After Hours Pager [phone]

## 2016-06-23 NOTE — Consult Note (Signed)
            [facility] CM Primary Care Navigator  06/23/2016  [name] [birth year] [id number]   Went back to see patientat the bedside to identify possible discharge needs but staffreports that physical therapy is working with patientat the moment.  Will attempt to meet with patient again at another time, when available in room.    For additional questions please contact:  Edwena Felty A. Natali Lavallee, BSN, RN-BC [facility] PRIMARY CARE Navigator Cell: [phone]

## 2016-06-23 NOTE — Progress Notes (Signed)
MEDICATION RELATED CONSULT NOTE - INITIAL   Pharmacy Consult for Iron replacement Indication: microcytic anemia  No Known Allergies  Patient Measurements: Height: 4\' 11"  (149.9 cm) Weight: 130 lb 1.6 oz (59 kg) IBW/kg (Calculated) : 43.2  Vital Signs: Temp: 98.1 F (36.7 C) (05/03 0335) Temp Source: Oral (05/03 0335) BP: 150/42 (05/03 0335) Pulse Rate: 55 (05/03 0335) Intake/Output from previous day: 05/02 0701 - 05/03 0700 In: 1000 [P.O.:600; I.V.:400] Out: 500 [Urine:500] Intake/Output from this shift: Total I/O In: -  Out: 400 [Urine:400]  Labs:  Recent Labs  06/21/16 1030 06/21/16 1624 06/21/16 2344 06/22/16 0236 06/22/16 1937 06/23/16 0248  WBC 8.9  --  9.5 9.3 8.7  --   HGB 5.2*  --  7.9* 8.3* 8.3*  --   HCT 17.2*  --  24.8* 26.1* 26.6*  --   PLT 219  --  158 145* 138*  --   CREATININE 4.05*  --   --  3.67*  --  3.11*  MG  --  2.1  --   --   --  1.7  PHOS  --  5.5*  --   --   --   --   ALBUMIN 2.4*  --   --  2.3*  --  2.0*  PROT 5.1*  --   --  5.2*  --  4.9*  AST 352*  --   --  252*  --  113*  ALT 275*  --   --  238*  --  165*  ALKPHOS 107  --   --  89  --  91  BILITOT 0.9  --   --  1.2  --  0.9   Estimated Creatinine Clearance: 13.9 mL/min (A) (by C-G formula based on SCr of 3.11 mg/dL (H)).   Assessment: 66yo female with microcytic anemia. Pharmacy is consulted to dose iron replacement. Based on hemoglobin iron deficit equation, patient will require at least 730 mg of iron for replacement. Will give in two separate doses.  Plan:  Feraheme 510mg  IV weekly for 2 doses Monitor CBC and iron panel Monitor for infusion reaction for 30 minutes after administration  Andrey Cota. Diona Foley, PharmD, BCPS Clinical Pharmacist [phone] 06/23/2016,8:51 AM

## 2016-06-23 NOTE — Progress Notes (Signed)
Progress Note  Patient Name: [name] Date of Encounter: 06/23/2016  Primary Cardiologist: Dr. Percival Spanish  Subjective   Feeling well today. Denies any CP, SOB. No further abdominal pain. No LEE.  Inpatient Medications    Scheduled Meds: . calcitRIOL  0.5 mcg Oral Daily  . calcium carbonate  1 tablet Oral TID WC  . collagenase   Topical Daily  . donepezil  10 mg Oral QHS  . furosemide  20 mg Intravenous BID  . mometasone-formoterol  1 puff Inhalation Daily  . omega-3 acid ethyl esters  1 g Oral TID  . pantoprazole  40 mg Oral BID AC  . sertraline  50 mg Oral Daily  . sodium chloride flush  3 mL Intravenous Q12H   Continuous Infusions: . ferumoxytol     PRN Meds: albuterol, ondansetron **OR** ondansetron (ZOFRAN) IV   Vital Signs    Vitals:   06/23/16 0044 06/23/16 0335 06/23/16 0732 06/23/16 0854  BP: (!) 142/54 (!) 150/42 (!) 151/58   Pulse: (!) 55 (!) 55  60  Resp: 11 18    Temp: 98.1 F (36.7 C) 98.1 F (36.7 C)  97.6 F (36.4 C)  TempSrc: Oral Oral  Oral  SpO2: 96% 100%  96%  Weight:  130 lb 1.6 oz (59 kg)    Height:        Intake/Output Summary (Last 24 hours) at 06/23/16 0905 Last data filed at 06/23/16 0855  Gross per 24 hour  Intake             1180 ml  Output              900 ml  Net              280 ml   Filed Weights   06/21/16 1530 06/22/16 0445 06/23/16 0335  Weight: 128 lb 12 oz (58.4 kg) 124 lb 9.6 oz (56.5 kg) 130 lb 1.6 oz (59 kg)    Telemetry    NSR/SB 50s-60s - Personally Reviewed   Physical Exam   GEN: No acute distress. Chronically ill appearing HEENT: Normocephalic, atraumatic, sclera non-icteric. Neck: No JVD or bruits. Cardiac: RRR. Soft SEM. No rubs or gallops Radials/DP/PT 1+ and equal bilaterally.  Respiratory: Faint crackles at bases. No wheezes or rhonchi. Breathing is unlabored. GI: Soft, nontender, non-distended, BS +x 4. MS: no deformity. Extremities: No clubbing or cyanosis. No edema. Distal pedal pulses  are 2+ and equal bilaterally. Neuro:  AAOx3. Follows commands. Psych:  Responds to questions appropriately with a normal affect.  Labs    Chemistry Recent Labs Lab 06/21/16 1030 06/22/16 0236 06/23/16 0248  NA 133* 136 136  K 3.8 3.2* 2.8*  CL 97* 101 102  CO2 21* 23 23  GLUCOSE 174* 133* 90  BUN 97* 90* 71*  CREATININE 4.05* 3.67* 3.11*  CALCIUM 8.2* 8.0* 7.6*  PROT 5.1* 5.2* 4.9*  ALBUMIN 2.4* 2.3* 2.0*  AST 352* 252* 113*  ALT 275* 238* 165*  ALKPHOS 107 89 91  BILITOT 0.9 1.2 0.9  GFRNONAA 11* 12* 15*  GFRAA 12* 14* 17*  ANIONGAP 15 12 11      Hematology Recent Labs Lab 06/21/16 2344 06/22/16 0236 06/22/16 1937  WBC 9.5 9.3 8.7  RBC 3.21* 3.39* 3.41*  HGB 7.9* 8.3* 8.3*  HCT 24.8* 26.1* 26.6*  MCV 77.3* 77.0* 78.0  MCH 24.6* 24.5* 24.3*  MCHC 31.9 31.8 31.2  RDW 17.1* 17.1* 17.5*  PLT 158 145* 138*  Cardiac Enzymes Recent Labs Lab 06/22/16 0236 06/22/16 0545 06/22/16 0947 06/22/16 1444  TROPONINI 0.49* 0.43* 0.41* 0.34*    Recent Labs Lab 06/21/16 1143  TROPIPOC 0.39*     BNP Recent Labs Lab 06/21/16 1624  BNP >4,500.0*     DDimer No results for input(s): DDIMER in the last 168 hours.   Radiology    US Abdomen Limited  Result Date: 06/21/2016 CLINICAL DATA:  Inpatient. Nausea and vomiting. Transaminitis. History of cholecystectomy. EXAM: US ABDOMEN LIMITED - RIGHT UPPER QUADRANT COMPARISON:  09/07/2012 CT abdomen/pelvis. FINDINGS: Gallbladder: Surgically absent. Common bile duct: Diameter: 6 mm. Liver: Liver parenchyma is diffusely mildly echogenic with mild posterior acoustic attenuation, compatible with mild diffuse hepatic steatosis. No definite liver surface irregularity. No liver mass detected, noting decreased sensitivity in the setting of an echogenic liver. IMPRESSION: 1. Bile ducts are within expected post cholecystectomy limits, with common bile duct diameter 6 mm . 2. Mild diffuse hepatic steatosis. Electronically Signed   By:  Ilona Sorrel M.D.   On: 06/21/2016 16:13   Dg Chest Portable 1 View  Result Date: 06/21/2016 CLINICAL DATA:  Anemia and shortness of breath EXAM: PORTABLE CHEST 1 VIEW COMPARISON:  April 04, 2015 and chest CT June 01, 2016 FINDINGS: There is no edema or consolidation. Scattered nodular opacities noted on recent chest CT are not appreciable by radiography. There is cardiomegaly with pulmonary vascular within normal limits. No adenopathy. There is aortic atherosclerosis. No bone lesions. IMPRESSION: No edema or consolidation. Nodular opacity seen on recent chest CT are not apparent by radiography. Stable cardiomegaly. There is aortic atherosclerosis. Electronically Signed   By: Lowella Grip III M.D.   On: 06/21/2016 12:34    Cardiac Studies   See summary below  Patient Profile     66 y.o. female with severe CAD managed medically by cath 2016 (not a surgical candidate due to renal and pulmonary issues), chronic diastolic CHF, CKD stage IV, tobacco abuse, PVD s/p left common iliac and external iliac artery stents as well as mesenteric/celiac/subclavian stenosis, renal artery stenosis, carotid disease s/p left carotid endarterectomy ~2000, bradycardia limiting BB titration, DM, COPD, HLD, HTN, GI AVM s/p cauterization, iron deficiency anemia, ?possible hx of TIA (in problem list by Dr. Percival Spanish), CVA 2010, Lewy body dementia, pulmonary nodules/mediastinal/hilar adeopathy (followed by pulm), pulmonary HTN and scabies. Admitted with nausea, vomiting, abnormal pain, found to have hemoglobin of 5.2, AKI on CKD stage IV (peak 4.05), elevated LFTs (felt possibly congestive hepatopathy s shock liver), hypoalbuminemia, hypokalemia, protein calorie malnutrition. EGD 06/22/16 with acute/erosive gastritis, nonbleeding angiectasia s/p APC -> anemia felt mixed picture, at least in part due to CKD/iron deficiency; possibility for deeper small bowel angioectactic lesions exist.  Troponins cycled with peak 0.49. 2D  echo 06/22/16 with distal septal/apical HK, EF 40-45%, elevated LVEDP, moderate MR, mild LAE/RAE, PASP 80, small posterior pericardial effusion.  Assessment & Plan    1. Acute on chronic anemia - per GI felt to represent mixed picture. ASA and Plavix on hold. Given suspicion for potential chronic blood loss anemia, may be prudent to hold Plavix going forward in the future. No recent PCI, but has h/o severe CAD and prior CVA/TIA. Will review with MD.  2. Acute on chronic combined CHF - markedly elevated BNP on 06/21/2016 but in the setting of acute anemia and AKI, prompting holding of diuretics. It appears her Lasix was restarted yesterday PM at 20mg  IV BID. Cr continues to improve but I/O's still positive and weight  is up today. She was on Lasix 80mg  orally daily at home. Will review with MD - consider increase to 40mg  IV BID. Albumin remains low which could be contributing to some volume retention. EF is decreased compared to prior, likely reflecting some demand process superimposed on known CAD. As previously stated, do not anticipate invasive ischemic assessment this admission given comorbidities. No ACEI/ARB/spiro due to AKI. No BB due to bradycardia. If BP remains stable, consider addition of hydralazine/nitrates.  3. Hypokalemia - primary team has been repleting.  4. AKI on CKD stage IV - improving.  5. CAD with elevated troponin, suspected demand process - I'm actually surprised her troponin was not significantly higher. See above regarding antiplatelet therapy. Beta blocker on hold due to bradycardia. No statin 2/2 elevated LFTs.  6. Severe pulm HTN - likely multifactorial in setting of known pulm/CV dz.  Signed, Charlie Pitter, PA-C  06/23/2016, 9:05 AM

## 2016-06-23 NOTE — Evaluation (Signed)
Physical Therapy Evaluation Patient Details Name: [name] MRN: [id number] DOB: 12/04/1950 Today's Date: 06/23/2016   History of Present Illness  Pt adm with GI bleed with anemia. PMH - GI bleed, RAS, HTN, DM2, Dementia, COPD, CKD, CAD.  Clinical Impression  Pt admitted with above diagnosis and presents to PT with functional limitations due to deficits listed below (See PT problem list). Pt needs skilled PT to maximize independence and safety to allow discharge to home with family. Will follow acutely to facilitate dc home. Expect pt will be at or close to baseline and will not need PT after DC.     Follow Up Recommendations No PT follow up;Supervision - Intermittent    Equipment Recommendations  None recommended by PT    Recommendations for Other Services       Precautions / Restrictions Precautions Precautions: Fall Restrictions Weight Bearing Restrictions: No      Mobility  Bed Mobility Overal bed mobility: Needs Assistance Bed Mobility: Sidelying to Sit;Supine to Sit   Sidelying to sit: Min assist;HOB elevated Supine to sit: Min guard     General bed mobility comments: Pt up in chair  Transfers Overall transfer level: Needs assistance Equipment used: 4-wheeled walker Transfers: Sit to/from Stand Sit to Stand: Min assist Stand pivot transfers: Mod assist       General transfer comment: Assist to bring hips up and for balance. Verbal cues for hand placement  Ambulation/Gait Ambulation/Gait assistance: Min guard Ambulation Distance (Feet): 100 Feet (100' x 1, 70' x 1) Assistive device: 4-wheeled walker Gait Pattern/deviations: Step-through pattern;Decreased step length - right;Decreased step length - left Gait velocity: decr Gait velocity interpretation: Below normal speed for age/gender General Gait Details: Assist for balance. 1 sitting rest break on rollator after 100'. Amb on RA with SpO2>95%.  Stairs            Wheelchair Mobility     Modified Rankin (Stroke Patients Only)       Balance Overall balance assessment: Needs assistance Sitting-balance support: Feet unsupported;No upper extremity supported Sitting balance-Leahy Scale: Fair     Standing balance support: Single extremity supported Standing balance-Leahy Scale: Poor Standing balance comment: UE support                             Pertinent Vitals/Pain Pain Assessment: No/denies pain    Home Living Family/patient expects to be discharged to:: Private residence Living Arrangements: Spouse/significant other Available Help at Discharge: Family;Available 24 hours/day Type of Home: Mobile home Home Access: Stairs to enter Entrance Stairs-Rails: Can reach both;Right;Left Entrance Stairs-Number of Steps: 2 Home Layout: One level Home Equipment: Cane - quad;Shower seat;Walker - 4 wheels Additional Comments: U[facility] O2 at home at [facility] and [facility] during the day. Ambulates with RW per pt report    Prior Function Level of Independence: Needs assistance   Gait / Transfers Assistance Needed: Modified independent with rollator  ADL's / Homemaking Assistance Needed: Pt daughter does cooking and assists with grocery shopping, driving. Husband assists with cleaning, bathing and dressing etc.  Comments: No famioly present; Note PMH includes dementia     Hand Dominance   Dominant Hand: Left    Extremity/Trunk Assessment   Upper Extremity Assessment Upper Extremity Assessment: Defer to OT evaluation    Lower Extremity Assessment Lower Extremity Assessment: Generalized weakness       Communication   Communication: No difficulties  Cognition Arousal/Alertness: Awake/alert Behavior During Therapy: Flat affect Overall Cognitive  Status: History of cognitive impairments - at baseline                                 General Comments: PMH Dementia noted      General Comments      Exercises     Assessment/Plan    PT  Assessment Patient needs continued PT services  PT Problem List Decreased strength;Decreased activity tolerance;Decreased balance;Decreased mobility       PT Treatment Interventions DME instruction;Gait training;Functional mobility training;Therapeutic activities;Therapeutic exercise;Balance training;Patient/family education    PT Goals (Current goals can be found in the Care Plan section)  Acute Rehab PT Goals Patient Stated Goal: go home PT Goal Formulation: With patient/family Time For Goal Achievement: 06/30/16 Potential to Achieve Goals: Good    Frequency Min 3X/week   Barriers to discharge        Co-evaluation               AM-PAC PT "6 Clicks" Daily Activity  Outcome Measure Difficulty turning over in bed (including adjusting bedclothes, sheets and blankets)?: A Little Difficulty moving from lying on back to sitting on the side of the bed? : Total Difficulty sitting down on and standing up from a chair with arms (e.g., wheelchair, bedside commode, etc,.)?: Total Help needed moving to and from a bed to chair (including a wheelchair)?: A Little Help needed walking in hospital room?: A Little Help needed climbing 3-5 steps with a railing? : A Lot 6 Click Score: 13    End of Session Equipment Utilized During Treatment: Gait belt Activity Tolerance: Patient tolerated treatment well Patient left: in chair;with call bell/phone within reach;with chair alarm set;with family/visitor present Nurse Communication: Mobility status PT Visit Diagnosis: Unsteadiness on feet (R26.81)    Time: 1045-1106 PT Time Calculation (min) (ACUTE ONLY): 21 min   Charges:   PT Evaluation $PT Eval Moderate Complexity: 1 Procedure     PT G CodesMarland Kitchen        [facility] [place] 06/23/2016, 12:08 PM

## 2016-06-23 NOTE — Progress Notes (Signed)
Potassium 2.8, Notified on call Provider.

## 2016-06-24 ENCOUNTER — Telehealth: Payer: Self-pay | Admitting: Cardiology

## 2016-06-24 ENCOUNTER — Telehealth: Payer: Self-pay | Admitting: Physician Assistant

## 2016-06-24 DIAGNOSIS — R195 Other fecal abnormalities: Secondary | ICD-10-CM

## 2016-06-24 LAB — COMPREHENSIVE METABOLIC PANEL
ALBUMIN: 2.1 g/dL — AB (ref 3.5–5.0)
ALT: 116 U/L — ABNORMAL HIGH (ref 14–54)
AST: 51 U/L — AB (ref 15–41)
Alkaline Phosphatase: 83 U/L (ref 38–126)
Anion gap: 9 (ref 5–15)
BUN: 65 mg/dL — AB (ref 6–20)
CHLORIDE: 105 mmol/L (ref 101–111)
CO2: 22 mmol/L (ref 22–32)
Calcium: 8.4 mg/dL — ABNORMAL LOW (ref 8.9–10.3)
Creatinine, Ser: 3.03 mg/dL — ABNORMAL HIGH (ref 0.44–1.00)
GFR calc Af Amer: 17 mL/min — ABNORMAL LOW (ref >60)
GFR calc non Af Amer: 15 mL/min — ABNORMAL LOW (ref >60)
GLUCOSE: 125 mg/dL — AB (ref 65–99)
POTASSIUM: 3.4 mmol/L — AB (ref 3.5–5.1)
Sodium: 136 mmol/L (ref 135–145)
Total Bilirubin: 0.9 mg/dL (ref 0.3–1.2)
Total Protein: 4.9 g/dL — ABNORMAL LOW (ref 6.5–8.1)

## 2016-06-24 LAB — CBC
HCT: 26.8 % — ABNORMAL LOW (ref 36.0–46.0)
Hemoglobin: 8.1 g/dL — ABNORMAL LOW (ref 12.0–15.0)
MCH: 24.1 pg — ABNORMAL LOW (ref 26.0–34.0)
MCHC: 30.2 g/dL (ref 30.0–36.0)
MCV: 79.8 fL (ref 78.0–100.0)
PLATELETS: 122 10*3/uL — AB (ref 150–400)
RBC: 3.36 MIL/uL — ABNORMAL LOW (ref 3.87–5.11)
RDW: 17.7 % — AB (ref 11.5–15.5)
WBC: 8.9 10*3/uL (ref 4.0–10.5)

## 2016-06-24 LAB — GLUCOSE, CAPILLARY
GLUCOSE-CAPILLARY: 120 mg/dL — AB (ref 65–99)
GLUCOSE-CAPILLARY: 148 mg/dL — AB (ref 65–99)

## 2016-06-24 MED ORDER — FUROSEMIDE 80 MG PO TABS
80.0000 mg | ORAL_TABLET | Freq: Two times a day (BID) | ORAL | Status: DC
  Administered 2016-06-24: 80 mg via ORAL
  Filled 2016-06-24: qty 1

## 2016-06-24 MED ORDER — ASPIRIN EC 81 MG PO TBEC
81.0000 mg | DELAYED_RELEASE_TABLET | Freq: Every day | ORAL | Status: DC
  Administered 2016-06-24: 81 mg via ORAL

## 2016-06-24 MED ORDER — PANTOPRAZOLE SODIUM 40 MG PO TBEC: 40.0000 mg | DELAYED_RELEASE_TABLET | Freq: Two times a day (BID) | ORAL | 2 refills | Status: DC

## 2016-06-24 MED ORDER — POTASSIUM CHLORIDE CRYS ER 20 MEQ PO TBCR
40.0000 meq | EXTENDED_RELEASE_TABLET | Freq: Two times a day (BID) | ORAL | Status: DC
  Administered 2016-06-24: 40 meq via ORAL
  Filled 2016-06-24: qty 2

## 2016-06-24 NOTE — Discharge Summary (Signed)
[facility] Discharge Summary  Patient name: [name] Medical record number: [id number] Date of birth: 12/29/1950 Age: 66 y.o. Gender: female Date of Admission: 06/21/2016  Date of Discharge: 06/24/16  Admitting Physician: Kinnie Feil, MD  Primary Care Provider: Arnoldo Morale, MD Consultants: GI, cardiology  Indication for Hospitalization: symptomatic anemia, volume overload  Discharge Diagnoses/Problem List:  CHF Anemia, stable Esophagitis Acute on CKD IV 2/2 RAS Hypokalemia Bradycardia Transaminitis Dementia DMII Tobacco abuse COPD  Disposition: home  Discharge Condition: stable  Discharge Exam: see progress note from day of discharge  Brief Hospital Course:  Patient presented with fatigue, vomiting, and hematochezia per her report. Hemoglobin found to be 5.2 with + FOBT. Troponin found to be 0.39 on admission, patient denied chest pain and EKG without signs of ischemia. ASA and plavix held, 2U PRBC transfused, IV protonix started. GI was consulted, and EGD was performed finding esophagitis, several erosions, and a distal duodenal angioectasia that was frozen. GI recommended continued PPI x 6 weeks BID and then daily indefinitely. Hemoglobin remained stable after 2U PRBC. IV iron given. Transaminitis found on admission, likely shock liver in the setting of anemia. Hepatitis panel negative, HIV negative, liver enzymes trended down prior to discharge. Statin was held during entire admission due to transaminitis. Additionally, creatinine was elevated to 4.05 on admission without any known cause. Etiology thought to be secondary to volume overload or anemia. Creatinine trended down throughout admission despite minimal fluids and Lasix administration. Patient follows with outpatient nephrologist, Dr. Florene Glen. Given fluid overload, echo was repeated and EF was worsened to 40-45%. Also, PA peak pressure was 80, which is concerning given history of  COPD.  Issues for Follow Up:  1. Please recheck volume status on exam. Patient diuresed ~ 2L during admission, but clinically appeared euvolemic on discharge.  2. Hepatitis panel as well as Hep B surface antibody negative, suggesting nonimmune status. Recommend outpatient immunization.  3. Recommend outpatient referral to pulmonology given COPD and elevated PA peak pressure of 80mg  Hg.  4. Plavix to be held indefinitely per cardiology as patient with recurrent GI bleeding. Continue low dose enteric coated aspirin.  5. Continue BID PPI x 6 weeks (~6/9) and then transition to daily PPI indefinitely per GI recs given significant esophagitis and erosions on EGD.  6. Statin held due to transaminitis, which was resolving prior to discharge. Please recheck CMP at hospital follow up and restart statin if LFTs normalized.   Significant Procedures: EGD findings below: - LA Grade C reflux, acute and erosive esophagitis. - 2 cm hiatal hernia. - Non-bleeding erosive gastropathy. - A single non-bleeding angioectasia in the duodenum. Treated with argon plasma coagulation (APC). - The examined portion of the jejunum was normal. - No specimens collected.  Significant Labs and Imaging:   Recent Labs Lab 06/22/16 1937 06/23/16 1556 06/24/16 0339  WBC 8.7 9.4 8.9  HGB 8.3* 8.3* 8.1*  HCT 26.6* 26.6* 26.8*  PLT 138* 127* 122*    Recent Labs Lab 06/21/16 1030 06/21/16 1624 06/22/16 0236 06/23/16 0248 06/24/16 0339  NA 133*  --  136 136 136  K 3.8  --  3.2* 2.8* 3.4*  CL 97*  --  101 102 105  CO2 21*  --  23 23 22   GLUCOSE 174*  --  133* 90 125*  BUN 97*  --  90* 71* 65*  CREATININE 4.05*  --  3.67* 3.11* 3.03*  CALCIUM 8.2*  --  8.0* 7.6* 8.4*  MG  --  2.1  --  1.7  --   PHOS  --  5.5*  --   --   --   ALKPHOS 107  --  89 91 83  AST 352*  --  252* 113* 51*  ALT 275*  --  238* 165* 116*  ALBUMIN 2.4*  --  2.3* 2.0* 2.1*    Trop 0.34>0.49>0.43>0.41>0.34  Results/Tests Pending at Time  of Discharge: none  Discharge Medications:  Allergies as of 06/24/2016   No Known Allergies     Medication List    STOP taking these medications   clopidogrel 75 MG tablet Commonly known as:  PLAVIX   NIFEdipine 60 MG 24 hr tablet Commonly known as:  PROCARDIA XL   promethazine 25 MG tablet Commonly known as:  PHENERGAN   sodium bicarbonate 650 MG tablet     TAKE these medications   accu-chek softclix lancets Use as instructed daily.   albuterol 108 (90 Base) MCG/ACT inhaler Commonly known as:  PROVENTIL HFA;VENTOLIN HFA Inhale 2 puffs into the lungs every 6 (six) hours as needed for wheezing or shortness of breath.   aspirin EC 81 MG tablet Take 81 mg by mouth daily.   atorvastatin 80 MG tablet Commonly known as:  LIPITOR TAKE 1 TABLET BY MOUTH  DAILY AT 6 PM.   calcitRIOL 0.5 MCG capsule Commonly known as:  ROCALTROL Take 1 capsule (0.5 mcg total) by mouth daily.   calcium carbonate 1250 (500 Ca) MG tablet Commonly known as:  OS-CAL - dosed in mg of elemental calcium Take 1 tablet (500 mg of elemental calcium total) by mouth 3 (three) times daily with meals.   carvedilol 3.125 MG tablet Commonly known as:  COREG TAKE 1 TABLET BY MOUTH TWO  TIMES DAILY WITH A MEAL   collagenase ointment Commonly known as:  SANTYL Apply topically daily. Apply to right side of gluteal cleft Unstageable Pressure Injury once daily and PRN soiling.   donepezil 10 MG tablet Commonly known as:  ARICEPT Take 1 tablet (10 mg total) by mouth at bedtime.   feeding supplement (NEPRO CARB STEADY) Liqd Take 237 mLs by mouth 2 (two) times daily between meals.   ferrous sulfate 325 (65 FE) MG tablet Take 1 tablet (325 mg total) by mouth 2 (two) times daily with a meal.   fish oil-omega-3 fatty acids 1000 MG capsule Take 1 g by mouth 3 (three) times daily. Reported on 04/09/2015   furosemide 80 MG tablet Commonly known as:  LASIX Take 1 tablet (80 mg total) by mouth 2 (two) times  daily.   glucose blood test strip Commonly known as:  TRUE METRIX BLOOD GLUCOSE TEST Use as instructed   hydrOXYzine 25 MG tablet Commonly known as:  ATARAX/VISTARIL Take 1 tablet (25 mg total) by mouth 3 (three) times daily as needed.   isosorbide mononitrate 30 MG 24 hr tablet Commonly known as:  IMDUR Take 1 tablet (30 mg total) by mouth daily.   mometasone-formoterol 100-5 MCG/ACT Aero Commonly known as:  DULERA Inhale 1 puff into the lungs daily.   nitroGLYCERIN 0.4 MG SL tablet Commonly known as:  NITROSTAT Place 1 tablet (0.4 mg total) under the tongue every 5 (five) minutes as needed for chest pain.   pantoprazole 40 MG tablet Commonly known as:  PROTONIX Take 1 tablet (40 mg total) by mouth 2 (two) times daily before a meal.   sertraline 50 MG tablet Commonly known as:  ZOLOFT Take 1 tablet (50 mg total) by  mouth daily.   TRUE METRIX AIR GLUCOSE METER Devi 1 Device by Does not apply route 4 (four) times daily -  before meals and at bedtime.       Discharge Instructions: Please refer to Patient Instructions section of EMR for full details.  Patient was counseled important signs and symptoms that should prompt return to medical care, changes in medications, dietary instructions, activity restrictions, and follow up appointments.   Follow-Up Appointments: Follow-up Information    Barrett, Suanne Marker, PA-C Follow up.   Specialties:  Cardiology, Radiology Why:  [facility] office - see appt below - 07/04/16 at [place] at 8:45am. Suanne Marker is one of the PAs that works closely with Dr. Percival Spanish. Contact information: [address] [place] [address] [place]. Go on 06/29/2016.   Why:  at 9:45am for an appointment with Dr Jarold Song in the [facility]. Please bring all of your medications with you to your appointment.  Contact information: [place] [address] [phone]          Sela Hilding, MD 06/24/2016, 2:04 PM PGY-1, [place]

## 2016-06-24 NOTE — Care Management Note (Signed)
Case Management Note  Patient Details  Name: JEANIFER HALLIDAY MRN: [id number] Date of Birth: [birth year]  Subjective/Objective: Pt presented for Anemia and Abdominal Pain. Pt is from home with spouse and plan to return home 06-24-16. Pt given agency list for choice and pt's family chose [facility].  Referral made to [facility] to begin for RN 06-27-16- MD aware. OT to begin the weekend.                    Action/Plan: No further needs from CM at this time.   Expected Discharge Date:  06/24/16               Expected Discharge Plan:  [name]  In-House Referral:  NA  Discharge planning Services  CM Consult  Post Acute Care Choice:  Home Health Choice offered to:  Patient  DME Arranged:  N/A DME Agency:  NA  HH Arranged:  RN, OT HH Agency:  [facility]  Status of Service:  Completed, signed off  If discussed at [place] of Stay Meetings, dates discussed:    Additional Comments:  Bethena Roys, RN 06/24/2016, 2:37 PM

## 2016-06-24 NOTE — Progress Notes (Signed)
Progress Note  Patient Name: [name] Date of Encounter: 06/24/2016  Primary Cardiologist: Dr. Percival Spanish  Subjective   Feels well - denies any chest pain, SOB, orthopnea, edema.  Inpatient Medications    Scheduled Meds: . calcitRIOL  0.5 mcg Oral Daily  . calcium carbonate  1 tablet Oral TID WC  . carvedilol  3.125 mg Oral BID WC  . collagenase   Topical Daily  . donepezil  10 mg Oral QHS  . furosemide  80 mg Oral BID  . mometasone-formoterol  1 puff Inhalation Daily  . omega-3 acid ethyl esters  1 g Oral TID  . pantoprazole  40 mg Oral BID AC  . potassium chloride  40 mEq Oral BID  . sertraline  50 mg Oral Daily  . sodium chloride flush  3 mL Intravenous Q12H   Continuous Infusions: . ferumoxytol Stopped (06/23/16 1313)   PRN Meds: albuterol, ondansetron **OR** ondansetron (ZOFRAN) IV   Vital Signs    Vitals:   06/23/16 2017 06/24/16 0033 06/24/16 0408 06/24/16 0818  BP: (!) 126/49 106/67 (!) 112/55   Pulse: 68 (!) 59 71   Resp: 12 (!) 9    Temp: 98.2 F (36.8 C) 98.8 F (37.1 C) 98.7 F (37.1 C)   TempSrc: Oral Oral Oral   SpO2: 100% 100% 93% 94%  Weight:   129 lb 13.6 oz (58.9 kg)   Height:        Intake/Output Summary (Last 24 hours) at 06/24/16 0822 Last data filed at 06/24/16 0551  Gross per 24 hour  Intake             1137 ml  Output              600 ml  Net              537 ml   Filed Weights   06/22/16 0445 06/23/16 0335 06/24/16 0408  Weight: 124 lb 9.6 oz (56.5 kg) 130 lb 1.6 oz (59 kg) 129 lb 13.6 oz (58.9 kg)    Telemetry    NSR/SB upper 40s yesterday afternoon but 50s-60s since then - Personally Reviewed  Physical Exam  GEN: No acute distress. Chronically ill appearing. Lying flat in bed without distress HEENT: Normocephalic, atraumatic, sclera non-icteric. Neck:No JVD or bruits. Cardiac:RRR. Soft SEM. No rubs or gallops Radials/DP/PT 1+ and equal bilaterally.  Respiratory: CTAB. No wheezes or rhonchi. Breathing is  unlabored. AS:TMHD, nontender, non-distended, BS +x 4. MS:no deformity. Extremities: No clubbing or cyanosis. No edema. Distal pedal pulses are 2+ and equal bilaterally. Neuro:AAOx3. Follows commands. Psych:  Responds to questions appropriately with a normal affect.  Labs    Chemistry Recent Labs Lab 06/22/16 0236 06/23/16 0248 06/24/16 0339  NA 136 136 136  K 3.2* 2.8* 3.4*  CL 101 102 105  CO2 23 23 22   GLUCOSE 133* 90 125*  BUN 90* 71* 65*  CREATININE 3.67* 3.11* 3.03*  CALCIUM 8.0* 7.6* 8.4*  PROT 5.2* 4.9* 4.9*  ALBUMIN 2.3* 2.0* 2.1*  AST 252* 113* 51*  ALT 238* 165* 116*  ALKPHOS 89 91 83  BILITOT 1.2 0.9 0.9  GFRNONAA 12* 15* 15*  GFRAA 14* 17* 17*  ANIONGAP 12 11 9      Hematology Recent Labs Lab 06/22/16 1937 06/23/16 1556 06/24/16 0339  WBC 8.7 9.4 8.9  RBC 3.41* 3.38* 3.36*  HGB 8.3* 8.3* 8.1*  HCT 26.6* 26.6* 26.8*  MCV 78.0 78.7 79.8  MCH 24.3* 24.6* 24.1*  MCHC 31.2 31.2 30.2  RDW 17.5* 17.4* 17.7*  PLT 138* 127* 122*    Cardiac Enzymes Recent Labs Lab 06/22/16 0236 06/22/16 0545 06/22/16 0947 06/22/16 1444  TROPONINI 0.49* 0.43* 0.41* 0.34*    Recent Labs Lab 06/21/16 1143  TROPIPOC 0.39*     BNP Recent Labs Lab 06/21/16 1624  BNP >4,500.0*     DDimer No results for input(s): DDIMER in the last 168 hours.   Radiology    No results found.  Cardiac Studies   See summary below  Patient Profile     65 y.o. female with severe CAD managed medically by cath 2016 (not a surgical candidate due to renal and pulmonary issues), chronic diastolic CHF, CKD stage IV, tobacco abuse, PVD s/p left common iliac and external iliac artery stents as well as mesenteric/celiac/subclavian stenosis, renal artery stenosis, carotid disease s/p left carotid endarterectomy ~2000, bradycardia limiting BB titration, DM, COPD, HLD, HTN, GI AVM s/p cauterization, iron deficiency anemia, ?possible hx of TIA (in problem list by Dr. Percival Spanish), CVA  2010, Lewy body dementia, pulmonary nodules/mediastinal/hilar adeopathy (followed by pulm), pulmonary HTN and scabies. Admitted with nausea, vomiting, abnormal pain, found to have hemoglobin of 5.2, AKI on CKD stage IV (peak 4.05), elevated LFTs (felt possibly congestive hepatopathy s shock liver), hypoalbuminemia, hypokalemia, protein calorie malnutrition. EGD 06/22/16 with acute/erosive gastritis, nonbleeding angiectasia s/p APC -> anemia felt mixed picture, at least in part due to CKD/iron deficiency; possibility for deeper small bowel angioectactic lesions exist.  Troponins cycled with peak 0.49. 2D echo 06/22/16 with distal septal/apical HK, EF 40-45%, elevated LVEDP, moderate MR, mild LAE/RAE, PASP 80, small posterior pericardial effusion.  Assessment & Plan    1. Acute on chronic anemia - per GI felt to represent mixed picture. ASA and Plavix on hold. Given suspicion for potential chronic blood loss anemia, Plavix will be held indefinitely. Await input from primary teams about when to resume aspirin.   2. Acute on chronic combined CHF - markedly elevated BNP on 06/21/2016 but in the setting of acute anemia and AKI, prompting holding of diuretics. Started back on IV diuretics 5/2 - weight down 1lb, I/O's with only -1000 UOP yesterday but patient reports feeling much better and exam is c/w euvolemia. Will change to home dose Lasix 80mg  BID today. Dr. Oval Linsey resumed beta blocker yesterday at very low dose (previously stopped due to bradycardia - HR was in upper 40s yesterday afternoon prior to BB dose but is now 50s-60s). Albumin remains low which could be contributing to some volume retention. EF is decreased compared to prior, likely reflecting some demand process superimposed on known CAD. As previously stated, do not anticipate invasive ischemic assessment this admission given comorbidities. No ACEI/ARB/spiro due to AKI.   3. Hypokalemia - primary team has been repleting.  4. AKI on CKD stage IV -  slowly improving.  5. CAD with elevated troponin, suspected demand process - surprising that her troponin was actually not significantly higher. See above regarding antiplatelet therapy. No statin 2/2 elevated LFTs.  6. Severe pulm HTN - likely multifactorial in setting of known pulm/CV dz.  Called office for [facility] follow-up appointment - 07/04/16 at [place] PA-C  Signed, Charlie Pitter, PA-C  06/24/2016, 8:22 AM

## 2016-06-24 NOTE — Progress Notes (Signed)
Family Medicine Teaching Service Daily Progress Note Intern Pager: [phone]  Patient name: [name] Medical record number: [id number] Date of birth: [birth year] Age: 66 y.o. Gender: female  Primary Care Provider: Arnoldo Morale, MD Consultants: GI, cardiology Code Status: DNR  Pt Overview and Major Events to Date:  5/1 admitted with symptomatic anemia and possible GI bleed, also fluid overload 5/2 EGD > several erosion, angioectasia at distal duodenum was frozen  Assessment and Plan: [name] is a 66 y.o. female presenting with vomiting and diarrhea x 1 week and fatique. PMH is significant for diet controlled DMII, CKD IV (baseline Cr appears to be 2.5) 2/2 to renal artery stenosis followed by [facility], diastolic CHF (EF 14-78% last echo 03/2015), HTN, COPD (no home O2).   Anemia: likely combination of slow GI bleed and iron deficiency. S/p 2U PRBC with Hgb from 5.2 > 7.0 > 8.3 > 8.3 > 8.1.  -GI consult: low dose ASA, hold plavix indefinitely -BID protonix 40mg  PO x 6 weeks then daily -CBCs daily -cardiac monitoring  CHF:  Weight up 1lb since admission. UOP 1000cc overnight. -lasix 40mg  IV BID  - In/outs and daily weights   Abdominal pain, resolved: No longer having abdominal pain, UA with possible infection although does not meet McGeer's criteria. Will not treat for UTI. -mild hematuria noted, patient may need urology workup as this is persistent -continue to monitor on exam  Acute on CKD IV 2/2 RAS: follows with [facility] Dr. Florene Glen. Baseline Cr recently appears to be 2.5-2.8. Cr 4.05 on admission > 3.67 > 3.11 > 3.03.  -continue to monitor daily CMPs  Hypokalemia: likely 2/2 vomiting and diarrhea. K 3.4.  -replete with Kdur 45meQ x 2 doses  Bradycardia: HR at last 3 OP visits over the past year has been 60s-80s.  -cardiac monitoring -cardiology restarted BB -discontinued home procardia given worsened EF  Transaminitis, improving: Most likely  congestive hepatopathy vs shock liver. Hep panel negative, Hepatitis B surface antibody negative. acetaminophen level <10. Some improvement with transfusion. HIV nonreactive. Hep B surface antibody negative - needs outpatient vaccination. -continue to monitor CMPs daily  Dementia: PCP notes suggest Lewy Body dementia. History of CVA in 2010. -restart aricept  DMII: diet controlled per PCP notes. Last HgbA1c 06/15/16 of 6.1.  -CBGs daily  Tobacco Abuse: 1 ppd x many years, briefly quit in the recent past, back to smoking 1ppd now - Consider a nictoine patch if needed   COPD/pulmonary nodule/pulmonary HTN: Followed by pulmonology for COPD and several pulmonary nodules along the middle mediastinal/hilar adenopathy. Last ECHO noted a pulmonary peak pressure of 80mg Hg - Continue Dulera  - outpatient pulm referral - PRN albuterol   FEN/GI: heart healthy diet, PPI Prophylaxis: SCDs  Disposition: continued inpatient management of fluid status  Subjective:  Patient feels well this morning, in good spirits. Denies any pain.   Objective: Temp:  [97.6 F (36.4 C)-98.9 F (37.2 C)] 98.1 F (36.7 C) (05/04 0827) Pulse Rate:  [59-77] 75 (05/04 0823) Resp:  [9-18] 9 (05/04 0033) BP: (106-146)/(49-88) 139/50 (05/04 0823) SpO2:  [93 %-100 %] 96 % (05/04 0827) FiO2 (%):  [21 %] 21 % (05/04 0818) Weight:  [129 lb 13.6 oz (58.9 kg)] 129 lb 13.6 oz (58.9 kg) (05/04 0408) Physical Exam: General: pale female resting in bed in NAD. Cardiovascular: bradycardic, distant heart sounds, no murmur Respiratory: faint bibasilar crackles, easy WOB, on 1L [place] Abdomen: nontender to palpation, +BS Extremities: trace bilateral LE edema, warm.  Laboratory:  Recent Labs Lab 06/22/16 1937 06/23/16 1556 06/24/16 0339  WBC 8.7 9.4 8.9  HGB 8.3* 8.3* 8.1*  HCT 26.6* 26.6* 26.8*  PLT 138* 127* 122*    Recent Labs Lab 06/22/16 0236 06/23/16 0248 06/24/16 0339  NA 136 136 136  K 3.2* 2.8* 3.4*   CL 101 102 105  CO2 23 23 22   BUN 90* 71* 65*  CREATININE 3.67* 3.11* 3.03*  CALCIUM 8.0* 7.6* 8.4*  PROT 5.2* 4.9* 4.9*  BILITOT 1.2 0.9 0.9  ALKPHOS 89 91 83  ALT 238* 165* 116*  AST 252* 113* 51*  GLUCOSE 133* 90 125*    Lactic acid WNL.  Imaging/Diagnostic Tests: No results found.  Sela Hilding, MD 06/24/2016, 9:20 AM PGY-1, [place] Intern pager: [phone], text pages welcome

## 2016-06-24 NOTE — Telephone Encounter (Signed)
Wrong Provider

## 2016-06-24 NOTE — Consult Note (Signed)
           [facility] CM Primary Care Navigator  06/24/2016  [name] December 09, 1950 [id number]   Went back to see patient earlier at the bedside to identify possible discharge needs but she was alreadydischarged per staff.  Patient was discharged home today with home health services [facility]).  Attempted to call primary care provider's office to notify of patient's discharge and need for post hospital follow-up and transition of care but noted that transition of care call (TCM) was done by S. Willis.  For questions, please contact:  Dannielle Huh, BSN, RN- [facility] Primary Care Navigator  Telephone: [phone] [place]

## 2016-06-24 NOTE — Telephone Encounter (Signed)
TCM Phone Call-Please call pt-Pt ha an appointment with Rosaria Ferries on 07-04-16.

## 2016-06-24 NOTE — Hospital Discharge Follow-Up (Signed)
Transitional Care Clinic Care Coordination Note:  Admit date:  06/21/2016 Discharge date: 06/24/2016 Discharge Disposition: home Patient contact: Patient/husband # [phone] Emergency contact(s): tanya ( daughter) # [phone]  This Case Manager reviewed patient's EMR and determined patient would benefit from post-discharge medical management and chronic care management services through the [facility]. Patient has a history of DM2, CKD - IV secondary to reanl artery stenosis, diastolic CHF, HTN. COPD.  She was admitted with fatigue, vomiting, hematochezia.  She was treated for anemia, CHF. . This Case Manager met with patient to discuss the services and medical management that can be provided at the [facility]. Patient verbalized understanding and agreed to receive post-discharge care at the [facility].  The patient has been followed by the [facility] in the past and has not been in the hospital for over one year.   Patient scheduled for Transitional Care appointment on 06/29/16 @ 0945.  Clinic information and appointment time provided to patient. Appointment information also placed on AVS.  Assessment:       Home Environment: lives in trailer with her husband and her son. [name] Her husband was present when this CM met with the patient in the hospital.        Support System: husband, son and daughter and grandson. The patient's daughter lives around the corner from the patient.        Level of functioning: independent with assist from family if needed       Home DME: walker, cane, glucometer. The patient said that she does not have O2 at home at this time.        Home care services: (services arranged prior to discharge or new services after discharge) Home Health RN and OT to be arranged through [facility].  The patient had services from [facility] in the past        Transportation: the patient's daughter, Lavella Lemons or her son.         Food/Nutrition: (ability to afford, access,  use of any community resources) No problems reported accessing food. The patient said that her daughter does the meal preparation/ shopping.         Medications: (ability to afford, access, compliance, Pharmacy used) She uses [facility] mail order/delivery. No problems reported with co-pays reported at this time.         Identified Barriers: possible difficulty with transportation if her son/daughter are not available.         PCP (Name, office location, phone number): Dr Jarold Song - [place] services:        Services communicated to/with Chriss Driver- Cherlyn Cushing, RN CM

## 2016-06-25 DIAGNOSIS — R262 Difficulty in walking, not elsewhere classified: Secondary | ICD-10-CM | POA: Diagnosis not present

## 2016-06-25 DIAGNOSIS — I5020 Unspecified systolic (congestive) heart failure: Secondary | ICD-10-CM | POA: Diagnosis not present

## 2016-06-26 DIAGNOSIS — I5020 Unspecified systolic (congestive) heart failure: Secondary | ICD-10-CM | POA: Diagnosis not present

## 2016-06-26 DIAGNOSIS — R262 Difficulty in walking, not elsewhere classified: Secondary | ICD-10-CM | POA: Diagnosis not present

## 2016-06-27 ENCOUNTER — Telehealth: Payer: Self-pay

## 2016-06-27 ENCOUNTER — Telehealth: Payer: Self-pay | Admitting: Family Medicine

## 2016-06-27 DIAGNOSIS — R262 Difficulty in walking, not elsewhere classified: Secondary | ICD-10-CM | POA: Diagnosis not present

## 2016-06-27 DIAGNOSIS — I5020 Unspecified systolic (congestive) heart failure: Secondary | ICD-10-CM | POA: Diagnosis not present

## 2016-06-27 NOTE — Telephone Encounter (Signed)
The Occupational Therapy call to let you know about PT, he said, 1 time wk for 3 wks, IDL transfer, exercise occasional discharge when goals med. Or max potentials. Education heart problem. If you have any question please call him at [phone]

## 2016-06-27 NOTE — Telephone Encounter (Signed)
Transitional Care Clinic Post-discharge Follow-Up Phone Call:  Date of Discharge:06/24/2016 Principal Discharge Diagnosis(es):  CHF, anemia, CKD - stage IV Post-discharge Communication: (Clearly document all attempts clearly and date contact made) call placed to the patient Call Completed: yes                    With Whom: Patient Interpreter Needed: No     Please check all that apply:  X  Patient is knowledgeable of his/her condition(s) and/or treatment. ? Patient is caring for self at home.  X  Patient is receiving assist at home from family and/or caregiver. Family and/or caregiver is knowledgeable of patient's condition(s) and/or treatment.  - her husband and son  and daughter provided the needed assistance. Her daughter manages her medications. Her so lives with her, not her grandson as noted in this CM's prior note.  X  Patient is receiving home health services. If so, name of agency.- She said that the nurse from [facility] has already been out to see her. OT has also been ordered.    Medication Reconciliation:  ? Medication list reviewed with patient. X  Patient obtained all discharge medications. If not, why?  She said that she has all of her medications. She noted that the home health nurse has reviewed all of them with her and she is taking them as ordered. She said that she did not need to review them with this CM and she noted that her daughter was not present in the home at the time of CM call.  She said that she has been checking her blood sugar and keeping a log of the results. Her blood sugar this morning was 167. She also noted that she is weighing herself every day and keeping a record of the weights. Her weight today was 134 lbs.    Activities of Daily Living:  ? Independent X  Needs assist (describe; ? home DME used)walker, cane. Today she said that she has O2 at home.  ? [facility] (describe, ? home DME used)   Community resources in place for patient:  ? None  X   Home Health/Home DME - [place] ? Assisted Living ? Support Group        Questions/Concerns discussed: no questions/concerns reported at this time. The patient stated that she is ' doing fine." Confirmed her appointment at the [facility] on 06/29/16 @ 0945. She said that she will have transportation to the clinic.

## 2016-06-27 NOTE — Telephone Encounter (Signed)
Okay to proceed.  

## 2016-06-27 NOTE — Telephone Encounter (Signed)
Patient contacted regarding discharge from [facility] on 06/24/16.  Patient understands to follow up with provider Suanne Marker, [place] on 07/04/16 at 0900 at [facility]. Patient understands discharge instructions? YES Patient understands medications and regiment? YES Patient understands to bring all medications to this visit? YES

## 2016-06-28 ENCOUNTER — Telehealth: Payer: Self-pay

## 2016-06-28 NOTE — Telephone Encounter (Signed)
Call placed to [facility] # [phone] requesting to speak to the patient's nurse. Spoke to Amy who said that she would leave a message for the patient's nurse, Colletta Maryland, to return the call to this CM # [phone].

## 2016-06-28 NOTE — Telephone Encounter (Signed)
Call placed to the patient and confirmed her appointment at [facility] tomorrow - 06/29/16 @ 0945. She said that her daughter would be bringing  her to the appointment.

## 2016-06-28 NOTE — Telephone Encounter (Signed)
Call received from [name], nurse with [name]. She said that she has seen the patient since her discharge from the hospital and has reviewed the patient's medications with her. This CM reviewed the current discharge medication list with [name] who said that she did not have pantoprazole on her list and will re-check with the patient. She stated that she was not sure if the patient still had home O2. She noted that she would check and then call this CM back.

## 2016-06-29 ENCOUNTER — Encounter: Payer: Self-pay | Admitting: Family Medicine

## 2016-06-29 ENCOUNTER — Ambulatory Visit: Payer: Medicare Other | Attending: Family Medicine | Admitting: Family Medicine

## 2016-06-29 VITALS — BP 130/50 | HR 82 | Temp 97.3°F | Wt 133.6 lb

## 2016-06-29 DIAGNOSIS — I252 Old myocardial infarction: Secondary | ICD-10-CM | POA: Insufficient documentation

## 2016-06-29 DIAGNOSIS — I10 Essential (primary) hypertension: Secondary | ICD-10-CM

## 2016-06-29 DIAGNOSIS — I25119 Atherosclerotic heart disease of native coronary artery with unspecified angina pectoris: Secondary | ICD-10-CM | POA: Diagnosis not present

## 2016-06-29 DIAGNOSIS — R262 Difficulty in walking, not elsewhere classified: Secondary | ICD-10-CM | POA: Diagnosis not present

## 2016-06-29 DIAGNOSIS — I130 Hypertensive heart and chronic kidney disease with heart failure and stage 1 through stage 4 chronic kidney disease, or unspecified chronic kidney disease: Secondary | ICD-10-CM | POA: Diagnosis not present

## 2016-06-29 DIAGNOSIS — F341 Dysthymic disorder: Secondary | ICD-10-CM | POA: Diagnosis not present

## 2016-06-29 DIAGNOSIS — E11319 Type 2 diabetes mellitus with unspecified diabetic retinopathy without macular edema: Secondary | ICD-10-CM | POA: Insufficient documentation

## 2016-06-29 DIAGNOSIS — K209 Esophagitis, unspecified without bleeding: Secondary | ICD-10-CM

## 2016-06-29 DIAGNOSIS — F418 Other specified anxiety disorders: Secondary | ICD-10-CM | POA: Diagnosis not present

## 2016-06-29 DIAGNOSIS — Z955 Presence of coronary angioplasty implant and graft: Secondary | ICD-10-CM | POA: Diagnosis not present

## 2016-06-29 DIAGNOSIS — R918 Other nonspecific abnormal finding of lung field: Secondary | ICD-10-CM

## 2016-06-29 DIAGNOSIS — E1122 Type 2 diabetes mellitus with diabetic chronic kidney disease: Secondary | ICD-10-CM | POA: Insufficient documentation

## 2016-06-29 DIAGNOSIS — N184 Chronic kidney disease, stage 4 (severe): Secondary | ICD-10-CM | POA: Diagnosis not present

## 2016-06-29 DIAGNOSIS — J449 Chronic obstructive pulmonary disease, unspecified: Secondary | ICD-10-CM | POA: Diagnosis not present

## 2016-06-29 DIAGNOSIS — E1151 Type 2 diabetes mellitus with diabetic peripheral angiopathy without gangrene: Secondary | ICD-10-CM | POA: Insufficient documentation

## 2016-06-29 DIAGNOSIS — F0390 Unspecified dementia without behavioral disturbance: Secondary | ICD-10-CM | POA: Diagnosis not present

## 2016-06-29 DIAGNOSIS — D649 Anemia, unspecified: Secondary | ICD-10-CM | POA: Diagnosis present

## 2016-06-29 DIAGNOSIS — D631 Anemia in chronic kidney disease: Secondary | ICD-10-CM | POA: Diagnosis not present

## 2016-06-29 DIAGNOSIS — Z9889 Other specified postprocedural states: Secondary | ICD-10-CM | POA: Diagnosis not present

## 2016-06-29 DIAGNOSIS — E785 Hyperlipidemia, unspecified: Secondary | ICD-10-CM | POA: Insufficient documentation

## 2016-06-29 DIAGNOSIS — Z7982 Long term (current) use of aspirin: Secondary | ICD-10-CM | POA: Diagnosis not present

## 2016-06-29 DIAGNOSIS — R740 Nonspecific elevation of levels of transaminase and lactic acid dehydrogenase [LDH]: Secondary | ICD-10-CM | POA: Diagnosis not present

## 2016-06-29 DIAGNOSIS — Z720 Tobacco use: Secondary | ICD-10-CM | POA: Diagnosis not present

## 2016-06-29 DIAGNOSIS — I5032 Chronic diastolic (congestive) heart failure: Secondary | ICD-10-CM | POA: Diagnosis not present

## 2016-06-29 DIAGNOSIS — Z7902 Long term (current) use of antithrombotics/antiplatelets: Secondary | ICD-10-CM | POA: Insufficient documentation

## 2016-06-29 DIAGNOSIS — I5020 Unspecified systolic (congestive) heart failure: Secondary | ICD-10-CM | POA: Diagnosis not present

## 2016-06-29 DIAGNOSIS — E1100 Type 2 diabetes mellitus with hyperosmolarity without nonketotic hyperglycemic-hyperosmolar coma (NKHHC): Secondary | ICD-10-CM | POA: Diagnosis not present

## 2016-06-29 DIAGNOSIS — E0800 Diabetes mellitus due to underlying condition with hyperosmolarity without nonketotic hyperglycemic-hyperosmolar coma (NKHHC): Secondary | ICD-10-CM | POA: Diagnosis not present

## 2016-06-29 DIAGNOSIS — Z9049 Acquired absence of other specified parts of digestive tract: Secondary | ICD-10-CM | POA: Diagnosis not present

## 2016-06-29 DIAGNOSIS — I2510 Atherosclerotic heart disease of native coronary artery without angina pectoris: Secondary | ICD-10-CM | POA: Diagnosis not present

## 2016-06-29 DIAGNOSIS — R911 Solitary pulmonary nodule: Secondary | ICD-10-CM | POA: Insufficient documentation

## 2016-06-29 DIAGNOSIS — J438 Other emphysema: Secondary | ICD-10-CM | POA: Diagnosis not present

## 2016-06-29 DIAGNOSIS — R7401 Elevation of levels of liver transaminase levels: Secondary | ICD-10-CM

## 2016-06-29 LAB — GLUCOSE, POCT (MANUAL RESULT ENTRY): POC GLUCOSE: 123 mg/dL — AB (ref 70–99)

## 2016-06-29 MED FILL — FERROUS SULFATE 325 MG TAB: 325 (65 FE) | 30 days supply | Qty: 90 | Fill #1

## 2016-06-29 MED FILL — ?HYDROXYZINE HCL 25 MG TAB: 25 MG | 20 days supply | Qty: 60 | Fill #1

## 2016-06-29 NOTE — Patient Instructions (Signed)
Chronic Obstructive Pulmonary Disease Chronic obstructive pulmonary disease (COPD) is a common lung condition in which airflow from the lungs is limited. COPD is a general term that can be used to describe many different lung problems that limit airflow, including both chronic bronchitis and emphysema. If you have COPD, your lung function will probably never return to normal, but there are measures you can take to improve lung function and make yourself feel better. What are the causes?  Smoking (common).  Exposure to secondhand smoke.  Genetic problems.  Chronic inflammatory lung diseases or recurrent infections. What are the signs or symptoms?  Shortness of breath, especially with physical activity.  Deep, persistent (chronic) cough with a large amount of thick mucus.  Wheezing.  Rapid breaths (tachypnea).  Gray or bluish discoloration (cyanosis) of the skin, especially in your fingers, toes, or lips.  Fatigue.  Weight loss.  Frequent infections or episodes when breathing symptoms become much worse (exacerbations).  Chest tightness. How is this diagnosed? Your health care provider will take a medical history and perform a physical examination to diagnose COPD. Additional tests for COPD may include:  Lung (pulmonary) function tests.  Chest X-ray.  CT scan.  Blood tests. How is this treated? Treatment for COPD may include:  Inhaler and nebulizer medicines. These help manage the symptoms of COPD and make your breathing more comfortable.  Supplemental oxygen. Supplemental oxygen is only helpful if you have a low oxygen level in your blood.  Exercise and physical activity. These are beneficial for nearly all people with COPD.  Lung surgery or transplant.  Nutrition therapy to gain weight, if you are underweight.  Pulmonary rehabilitation. This may involve working with a team of health care providers and specialists, such as respiratory, occupational, and physical  therapists. Follow these instructions at home:  Take all medicines (inhaled or pills) as directed by your health care provider.  Avoid over-the-counter medicines or cough syrups that dry up your airway (such as antihistamines) and slow down the elimination of secretions unless instructed otherwise by your health care provider.  If you are a smoker, the most important thing that you can do is stop smoking. Continuing to smoke will cause further lung damage and breathing trouble. Ask your health care provider for help with quitting smoking. He or she can direct you to community resources or hospitals that provide support.  Avoid exposure to irritants such as smoke, chemicals, and fumes that aggravate your breathing.  Use oxygen therapy and pulmonary rehabilitation if directed by your health care provider. If you require home oxygen therapy, ask your health care provider whether you should purchase a pulse oximeter to measure your oxygen level at home.  Avoid contact with individuals who have a contagious illness.  Avoid extreme temperature and humidity changes.  Eat healthy foods. Eating smaller, more frequent meals and resting before meals may help you maintain your strength.  Stay active, but balance activity with periods of rest. Exercise and physical activity will help you maintain your ability to do things you want to do.  Preventing infection and hospitalization is very important when you have COPD. Make sure to receive all the vaccines your health care provider recommends, especially the pneumococcal and influenza vaccines. Ask your health care provider whether you need a pneumonia vaccine.  Learn and use relaxation techniques to manage stress.  Learn and use controlled breathing techniques as directed by your health care provider. Controlled breathing techniques include: 1. Pursed lip breathing. Start by breathing in (inhaling)   through your nose for 1 second. Then, purse your lips as  if you were going to whistle and breathe out (exhale) through the pursed lips for 2 seconds. 2. Diaphragmatic breathing. Start by putting one hand on your abdomen just above your waist. Inhale slowly through your nose. The hand on your abdomen should move out. Then purse your lips and exhale slowly. You should be able to feel the hand on your abdomen moving in as you exhale.  Learn and use controlled coughing to clear mucus from your lungs. Controlled coughing is a series of short, progressive coughs. The steps of controlled coughing are: 1. Lean your head slightly forward. 2. Breathe in deeply using diaphragmatic breathing. 3. Try to hold your breath for 3 seconds. 4. Keep your mouth slightly open while coughing twice. 5. Spit any mucus out into a tissue. 6. Rest and repeat the steps once or twice as needed. Contact a health care provider if:  You are coughing up more mucus than usual.  There is a change in the color or thickness of your mucus.  Your breathing is more labored than usual.  Your breathing is faster than usual. Get help right away if:  You have shortness of breath while you are resting.  You have shortness of breath that prevents you from:  Being able to talk.  Performing your usual physical activities.  You have chest pain lasting longer than 5 minutes.  Your skin color is more cyanotic than usual.  You measure low oxygen saturations for longer than 5 minutes with a pulse oximeter. This information is not intended to replace advice given to you by your health care provider. Make sure you discuss any questions you have with your health care provider. Document Released: 11/17/2004 Document Revised: 07/16/2015 Document Reviewed: 10/04/2012 Elsevier Interactive Patient Education  2017 [employer]  

## 2016-06-29 NOTE — Progress Notes (Signed)
Transitionary care clinic  Date of telephone encounter: 06/24/16  Hospitalization dates: 06/21/16 through 06/24/16  Subjective:  Patient ID: [name], female    DOB: 05-Feb-1951  Age: 66 y.o. MRN: [id number]  CC: Anemia   HPI DENAE [name] s a 8 -year-old female with a history of diet-controlled type 2 diabetes mellitus (A1c 6.1), chronic kidney disease stage IV, hypertension, COPD, diastolic CHF (EF 62-94% down from 55-60% Previously) who comes into the clinic for follow-up at the transitional care clinic after hospitalization for anemia.  She had presented to [facility] ED with fatigue, vomiting and hematochezia and was found to have a hemoglobin of 5.2 on presentation for which she received 2 units of PRBC. Upper endoscopy revealed hiatal hernia, esophagitis, superficial ulcerations erosions and she was commenced on PPI to be taken twice daily for 6 weeks and daily thereafter.  2-D echo performed revealed EF of 40-45%, apical hypokinesis, mildly to moderately reduced systolic function, left and right atrium dilatation, elevated PA pressure of 80 mmHg  2-D echo from 03/2015- EF of 76-54%, normal systolic function, normal wall motion, no regional wall motion abnormalities, with 2 diastolic dysfunction, mildly dilated left atrium, moderately increased PA pressure of 62 mmHg  Her medication regimen was adjusted and she was subsequently discharged.  She presents today reporting doing well and denies any hematochezia or hematemesis. She has no abdominal pain. Physical therapy will be commencing today. She was previously on oxygen which was discontinued after she improved symptomatically and her oxygen saturations improved; oxygen saturation is 92% at rest. She denies shortness of breath, chest pains or wheezing.   Past Medical History:  Diagnosis Date  . Anemia, chronic disease 05/16/2008   Qualifier: Diagnosis of  By: Percival Spanish, MD, Farrel Gordon    . Bradycardia    a. Limiting BB  titration.  [name] CAD (coronary artery disease)    A. NSTEMI 2007 in setting of anemia; LHC 50% LM, 60% LAD, 30% RI, occluded RCA with L-R collaterals, normal EF, treated medically; B. LHC 01/2015 prox-mid RCA 100%, ostial to prox LAD 70%, LM 50%, mid-distal circ 90%-not surgical candidate due to renal and pulmonary issues)  . Carotid artery disease ([place])    a. Duplex 12/2014: left CEA patent with elevated velocities, 60-79% RICA (stable over serial exams), 65-03% LICA stenosis (stable over serial exams), 50% LECA, elevated velocities in bilateral subclavian arteries.  . Chronic diastolic CHF (congestive heart failure) ([place])   . CKD (chronic kidney disease), stage III   . COPD (chronic obstructive pulmonary disease) (HCC)    home oxygen  . Dementia   . Depression   . Diabetes mellitus   . Diabetic retinopathy ([place])   . Essential hypertension   . GI AVM (gastrointestinal arteriovenous vascular malformation)    small bowel  . Hyperlipidemia   . Hypertensive heart disease with congestive heart failure ([place])   . Iron deficiency anemia   . Peripheral vascular disease ([place])    a.s /p left common iliac and external iliac artery stents. b. H/o subclavian, mesenteric, and celiac artery stenosis (see below).  . Protein-calorie malnutrition ([place]) 01/24/2015  . Renal artery stenosis ([place])    a. Duplex 2014: no obvious evidence of hemodynamically significant stenosis >60%, bilateral intrarenal arteries exhibit absent diastolic flow. There is evidence of elevated velocities of the mid aorta. 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  . Tobacco abuse   . 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 Dieaulafoy's lesion 01/25/2012  Past Surgical History:  Procedure Laterality Date  . APPENDECTOMY    . CARDIAC CATHETERIZATION N/A 01/23/2015   Procedure: Left Heart Cath and Coronary Angiography;  Surgeon: Corky Crafts, MD;  Location: [facility] INVASIVE CV LAB;  Service: Cardiovascular;   Laterality: N/A;  . CAROTID ENDARTERECTOMY    . CESAREAN SECTION    . CHOLECYSTECTOMY    . COLONOSCOPY WITH ESOPHAGOGASTRODUODENOSCOPY (EGD)  06/22/2016  . ENTEROSCOPY  01/26/2012   Procedure: ENTEROSCOPY;  Surgeon: Iva Boop, MD;  Location: [facility] ENDOSCOPY;  Service: Endoscopy;  Laterality: N/A;  . ENTEROSCOPY N/A 03/25/2015   Procedure: ENTEROSCOPY;  Surgeon: Sherrilyn Rist, MD;  Location: [facility] ENDOSCOPY;  Service: Endoscopy;  Laterality: N/A;  . ENTEROSCOPY N/A 06/22/2016   Procedure: ENTEROSCOPY;  Surgeon: Beverley Fiedler, MD;  Location: [facility] ENDOSCOPY;  Service: Endoscopy;  Laterality: N/A;  . ESOPHAGOGASTRODUODENOSCOPY (EGD) WITH PROPOFOL N/A 06/22/2016   Procedure: ESOPHAGOGASTRODUODENOSCOPY (EGD) WITH PROPOFOL;  Surgeon: Beverley Fiedler, MD;  Location: [facility] ENDOSCOPY;  Service: Endoscopy;  Laterality: N/A;  . ILIAC ARTERY STENT     left  . SHOULDER ARTHROSCOPY    . TONSILLECTOMY      No Known Allergies   Outpatient Medications Prior to Visit  Medication Sig Dispense Refill  . albuterol (PROVENTIL HFA;VENTOLIN HFA) 108 (90 Base) MCG/ACT inhaler Inhale 2 puffs into the lungs every 6 (six) hours as needed for wheezing or shortness of breath. 1 Inhaler 3  . aspirin EC 81 MG tablet Take 81 mg by mouth daily.    [name] atorvastatin (LIPITOR) 80 MG tablet TAKE 1 TABLET BY MOUTH  DAILY AT 6 PM. 90 tablet 1  . Blood Glucose Monitoring Suppl (TRUE METRIX AIR GLUCOSE METER) DEVI 1 Device by Does not apply route 4 (four) times daily -  before meals and at bedtime. 1 Device 0  . calcitRIOL (ROCALTROL) 0.5 MCG capsule Take 1 capsule (0.5 mcg total) by mouth daily. 90 capsule 1  . calcium carbonate (OS-CAL - DOSED IN MG OF ELEMENTAL CALCIUM) 1250 (500 Ca) MG tablet Take 1 tablet (500 mg of elemental calcium total) by mouth 3 (three) times daily with meals. 90 tablet 0  . carvedilol (COREG) 3.125 MG tablet TAKE 1 TABLET BY MOUTH TWO  TIMES DAILY WITH A MEAL 180 tablet 1  . collagenase (SANTYL) ointment Apply  topically daily. Apply to right side of gluteal cleft Unstageable Pressure Injury once daily and PRN soiling. 15 g 0  . donepezil (ARICEPT) 10 MG tablet Take 1 tablet (10 mg total) by mouth at bedtime. 90 tablet 1  . ferrous sulfate 325 (65 FE) MG tablet Take 1 tablet (325 mg total) by mouth 2 (two) times daily with a meal. 180 tablet 1  . fish oil-omega-3 fatty acids 1000 MG capsule Take 1 g by mouth 3 (three) times daily. Reported on 04/09/2015    . furosemide (LASIX) 80 MG tablet Take 1 tablet (80 mg total) by mouth 2 (two) times daily. 180 tablet 1  . glucose blood (TRUE METRIX BLOOD GLUCOSE TEST) test strip Use as instructed 100 each 12  . hydrOXYzine (ATARAX/VISTARIL) 25 MG tablet Take 1 tablet (25 mg total) by mouth 3 (three) times daily as needed. 60 tablet 2  . isosorbide mononitrate (IMDUR) 30 MG 24 hr tablet Take 1 tablet (30 mg total) by mouth daily. 90 tablet 1  . Lancet Devices (ACCU-CHEK SOFTCLIX) lancets Use as instructed daily. 1 each 5  . mometasone-formoterol (DULERA) 100-5 MCG/ACT AERO Inhale 1  puff into the lungs daily. 1 Inhaler 3  . nitroGLYCERIN (NITROSTAT) 0.4 MG SL tablet Place 1 tablet (0.4 mg total) under the tongue every 5 (five) minutes as needed for chest pain. 10 tablet 0  . pantoprazole (PROTONIX) 40 MG tablet Take 1 tablet (40 mg total) by mouth 2 (two) times daily before a meal. 60 tablet 2  . sertraline (ZOLOFT) 50 MG tablet Take 1 tablet (50 mg total) by mouth daily. 90 tablet 1  . Nutritional Supplements (FEEDING SUPPLEMENT, NEPRO CARB STEADY,) LIQD Take 237 mLs by mouth 2 (two) times daily between meals. (Patient not taking: Reported on 03/04/2016)     No facility-administered medications prior to visit.     ROS Review of Systems  Constitutional: Negative for activity change, appetite change and fatigue.  HENT: Negative for congestion, sinus pressure and sore throat.   Eyes: Negative for visual disturbance.  Respiratory: Negative for cough, chest tightness,  shortness of breath and wheezing.   Cardiovascular: Negative for chest pain and palpitations.  Gastrointestinal: Negative for abdominal distention, abdominal pain and constipation.  Endocrine: Negative for polydipsia.  Genitourinary: Negative for dysuria and frequency.  Musculoskeletal: Negative for arthralgias and back pain.  Skin: Negative for rash.  Neurological: Negative for tremors, light-headedness and numbness.  Hematological: Does not bruise/bleed easily.  Psychiatric/Behavioral: Negative for agitation and behavioral problems.    Objective:  BP (!) 130/50   Pulse 82   Temp 97.3 F (36.3 C) (Oral)   Wt 133 lb 9.6 oz (60.6 kg)   SpO2 92%   BMI 26.98 kg/m   BP/Weight 06/29/2016 06/24/2016 4/82/7078  Systolic BP 675 449 201  Diastolic BP 50 50 70  Wt. (Lbs) 133.6 129.85 128  BMI 26.98 26.23 25.85      Physical Exam  Constitutional: She is oriented to person, place, and time. She appears well-developed and well-nourished.  Cardiovascular: Normal rate, normal heart sounds and intact distal pulses.   No murmur heard. Pulmonary/Chest: Effort normal and breath sounds normal. She has no wheezes. She has no rales. She exhibits no tenderness.  Abdominal: Soft. Bowel sounds are normal. She exhibits no distension and no mass. There is no tenderness.  Musculoskeletal: Normal range of motion. She exhibits edema (1+ pitting right ankle edema; no ankle edema on the left).  Neurological: She is alert and oriented to person, place, and time.  Skin: Skin is warm and dry.  Psychiatric: She has a normal mood and affect.     CMP Latest Ref Rng & Units 06/24/2016 06/23/2016 06/22/2016  Glucose 65 - 99 mg/dL 125(H) 90 133(H)  BUN 6 - 20 mg/dL 65(H) 71(H) 90(H)  Creatinine 0.44 - 1.00 mg/dL 3.03(H) 3.11(H) 3.67(H)  Sodium 135 - 145 mmol/L 136 136 136  Potassium 3.5 - 5.1 mmol/L 3.4(L) 2.8(L) 3.2(L)  Chloride 101 - 111 mmol/L 105 102 101  CO2 22 - 32 mmol/L _0 Calcium 8.9 - 10.3 mg/dL  8.4(L) 7.6(L) 8.0(L)  Total Protein 6.5 - 8.1 g/dL 4.9(L) 4.9(L) 5.2(L)  Total Bilirubin 0.3 - 1.2 mg/dL 0.9 0.9 1.2  Alkaline Phos 38 - 126 U/L 83 91 89  AST 15 - 41 U/L 51(H) 113(H) 252(H)  ALT 14 - 54 U/L 116(H) 165(H) 238(H)    CBC    Component Value Date/Time   WBC 8.9 06/24/2016 0339   RBC 3.36 (L) 06/24/2016 0339   HGB 8.1 (L) 06/24/2016 0339   HGB 10.9 (L) 09/27/2012 1410   HCT 26.8 (L) 06/24/2016 0071  HCT 28.9 (L) 03/04/2015 1510   HCT 31.6 (L) 09/27/2012 1410   PLT 122 (L) 06/24/2016 0339   PLT 342 09/27/2012 1410   MCV 79.8 06/24/2016 0339   MCV 86.4 09/27/2012 1410   MCH 24.1 (L) 06/24/2016 0339   MCHC 30.2 06/24/2016 0339   RDW 17.7 (H) 06/24/2016 0339   RDW 14.6 (H) 09/27/2012 1410   LYMPHSABS 0.8 04/21/2015 1616   LYMPHSABS 0.9 09/27/2012 1410   MONOABS 0.6 04/21/2015 1616   MONOABS 0.5 09/27/2012 1410   EOSABS 0.5 04/21/2015 1616   EOSABS 0.1 09/27/2012 1410   BASOSABS 0.1 04/21/2015 1616   BASOSABS 0.1 09/27/2012 1410    Lab Results  Component Value Date   HGBA1C 6.1 06/15/2016    Assessment & Plan:   1. Diabetes mellitus due to underlying condition with hyperosmolarity without coma, without long-term current use of insulin (HCC) Diet controlled with A1c of 6.1 - POCT glucose (manual entry)  2. Essential hypertension Blood pressure perform manually was 130/50 ; initial blood pressure with the Dinamap was elevated at 169/73  3. Other emphysema ([place]) Stable, previously on oxygen Oxygen saturation is 92% and have informed this patient that should be reinstated back on oxygen she would need an 88% or lower We'll work with the DME companies to perform an overnight pulse ox.  4. Chronic diastolic CHF (congestive heart failure) (HCC) Worsened with EF of 40-45% which is down from 55-60% previously Increased PA pressure likely due to underlying COPD  5. Transaminitis Improving - CMP14+EGFR  6. Stage 4 chronic kidney disease ([place]) Avoid  nephrotoxins Keep appointment with nephrology May need to discuss iron replacement given presence of anemia which could be secondary to chronic disease as well  7. Dementia without behavioral disturbance, unspecified dementia type Continue Aricept  8. Coronary artery disease involving native coronary artery of native heart with angina pectoris (HCC) Plavix on hold due to GI bleed Risk factor modification  9. ANXIETY DEPRESSION Stable  10. Acute esophagitis Continue PPI 2 weeks until 09/05/16 at which time she will be switched to once daily.  11. Lung nodules Stable Continue Zoloft Repeat CT in 6-12 months - due in 05/2016   No orders of the defined types were placed in this encounter.   Follow-up: Return in about 3 weeks (around 07/20/2016) for TCC; follow-up on chronic medical conditions.   Arnoldo Morale MD

## 2016-06-30 ENCOUNTER — Telehealth: Payer: Self-pay

## 2016-06-30 LAB — CMP14+EGFR
ALT: 34 IU/L — AB (ref 0–32)
AST: 15 IU/L (ref 0–40)
Albumin/Globulin Ratio: 1.3 (ref 1.2–2.2)
Albumin: 3 g/dL — ABNORMAL LOW (ref 3.6–4.8)
Alkaline Phosphatase: 90 IU/L (ref 39–117)
BUN/Creatinine Ratio: 21 (ref 12–28)
BUN: 44 mg/dL — AB (ref 8–27)
Bilirubin Total: 0.2 mg/dL (ref 0.0–1.2)
CALCIUM: 10.7 mg/dL — AB (ref 8.7–10.3)
CHLORIDE: 105 mmol/L (ref 96–106)
CO2: 21 mmol/L (ref 18–29)
Creatinine, Ser: 2.12 mg/dL — ABNORMAL HIGH (ref 0.57–1.00)
GFR calc Af Amer: 27 mL/min/{1.73_m2} — ABNORMAL LOW (ref >59)
GFR, EST NON AFRICAN AMERICAN: 24 mL/min/{1.73_m2} — AB (ref >59)
GLUCOSE: 120 mg/dL — AB (ref 65–99)
Globulin, Total: 2.4 g/dL (ref 1.5–4.5)
POTASSIUM: 4.8 mmol/L (ref 3.5–5.2)
Sodium: 141 mmol/L (ref 134–144)
TOTAL PROTEIN: 5.4 g/dL — AB (ref 6.0–8.5)

## 2016-06-30 NOTE — Telephone Encounter (Signed)
Clld pt - spoke to her spouse, Fritz Pickerel - No DPR on file - advsd to have pt cll back for lab results.

## 2016-06-30 NOTE — Telephone Encounter (Signed)
-----   Message from Arnoldo Morale, MD sent at 06/30/2016  2:13 PM EDT ----- Kidney function and liver function improved.

## 2016-07-04 ENCOUNTER — Telehealth: Payer: Self-pay

## 2016-07-04 ENCOUNTER — Ambulatory Visit: Payer: Medicare Other | Admitting: Physician Assistant

## 2016-07-04 DIAGNOSIS — R262 Difficulty in walking, not elsewhere classified: Secondary | ICD-10-CM | POA: Diagnosis not present

## 2016-07-04 DIAGNOSIS — I5020 Unspecified systolic (congestive) heart failure: Secondary | ICD-10-CM | POA: Diagnosis not present

## 2016-07-04 NOTE — Telephone Encounter (Signed)
PCS referral faxed to [facility] - fax # [phone] 

## 2016-07-04 NOTE — Telephone Encounter (Signed)
Call placed to [facility] # [phone]. Spoke to [employer], [employer], retail regarding monitoring the patient's O2 sat. Informed her that the patient did not qualify for O2 at her last appointment.  She said that they have not noticed any problems with the patient's respiratory status. She reported on 06/27/16 her O2 sat. was 96% and it has been running 96-97%.  Informed her that if any changes are noticed and the patient is in need of O2 to please notify Dr Jarold Song.  Bethena Roys also noted that the patient could use assistance with personal care and could benefit from a PCS referral. She noted that a PCS form was sent to Dr Jarold Song for signature.

## 2016-07-05 ENCOUNTER — Telehealth: Payer: Self-pay | Admitting: Family Medicine

## 2016-07-05 DIAGNOSIS — K31819 Angiodysplasia of stomach and duodenum without bleeding: Secondary | ICD-10-CM

## 2016-07-05 DIAGNOSIS — I5020 Unspecified systolic (congestive) heart failure: Secondary | ICD-10-CM | POA: Diagnosis not present

## 2016-07-05 DIAGNOSIS — R262 Difficulty in walking, not elsewhere classified: Secondary | ICD-10-CM | POA: Diagnosis not present

## 2016-07-05 NOTE — Telephone Encounter (Signed)
Caller requesting change in order to allow for 2 visits this week instead of 1 due to patient request. Please f/u with caller. Caller requests a message be left if he is unable to answer.

## 2016-07-05 NOTE — Telephone Encounter (Signed)
Denyse Amass from [facility] called to inform provider that patient has reached the end of nurse visit. Pt is complaining of loose black stool since she was discharged from a facility. Pt isnt systematic anemic. Denyse Amass is suggesting that patient come in for a visit or maybe needs prescription for stool. Patient will continue PT and OT but no need for nurse visit. Please follow up.  Thank you.

## 2016-07-06 DIAGNOSIS — R262 Difficulty in walking, not elsewhere classified: Secondary | ICD-10-CM | POA: Diagnosis not present

## 2016-07-06 DIAGNOSIS — I5020 Unspecified systolic (congestive) heart failure: Secondary | ICD-10-CM | POA: Diagnosis not present

## 2016-07-06 NOTE — Telephone Encounter (Signed)
Okay to give verbal order.

## 2016-07-07 ENCOUNTER — Encounter: Payer: Self-pay | Admitting: Nurse Practitioner

## 2016-07-07 ENCOUNTER — Ambulatory Visit (INDEPENDENT_AMBULATORY_CARE_PROVIDER_SITE_OTHER): Payer: Medicare Other | Admitting: Nurse Practitioner

## 2016-07-07 VITALS — BP 148/56 | HR 49 | Ht 59.0 in | Wt 130.2 lb

## 2016-07-07 DIAGNOSIS — I779 Disorder of arteries and arterioles, unspecified: Secondary | ICD-10-CM

## 2016-07-07 DIAGNOSIS — E785 Hyperlipidemia, unspecified: Secondary | ICD-10-CM | POA: Diagnosis not present

## 2016-07-07 DIAGNOSIS — I10 Essential (primary) hypertension: Secondary | ICD-10-CM | POA: Diagnosis not present

## 2016-07-07 DIAGNOSIS — I2510 Atherosclerotic heart disease of native coronary artery without angina pectoris: Secondary | ICD-10-CM | POA: Diagnosis not present

## 2016-07-07 DIAGNOSIS — I5042 Chronic combined systolic (congestive) and diastolic (congestive) heart failure: Secondary | ICD-10-CM | POA: Diagnosis not present

## 2016-07-07 DIAGNOSIS — R262 Difficulty in walking, not elsewhere classified: Secondary | ICD-10-CM | POA: Diagnosis not present

## 2016-07-07 DIAGNOSIS — I5020 Unspecified systolic (congestive) heart failure: Secondary | ICD-10-CM | POA: Diagnosis not present

## 2016-07-07 DIAGNOSIS — I739 Peripheral vascular disease, unspecified: Secondary | ICD-10-CM

## 2016-07-07 NOTE — Patient Instructions (Signed)
Medication Instructions: No changes  Follow-Up: Your physician recommends that you schedule a follow-up appointment in: 6 weeks with Dr. Percival Spanish or Rosaria Ferries, PA   If you need a refill on your cardiac medications before your next appointment, please call your pharmacy.

## 2016-07-07 NOTE — Addendum Note (Signed)
Addended by: Murray Hodgkins R on: 07/07/2016 12:48 PM   Modules accepted: Orders

## 2016-07-07 NOTE — Progress Notes (Addendum)
Office Visit    Patient Name: [name] Date of Encounter: 07/07/2016  Primary Care Provider:  Arnoldo Morale, MD Primary Cardiologist:  J. Hochrein, MD   Chief Complaint    66 year old female with a prior history of coronary artery disease status post prior catheterizations revealing severe obstructive disease, though she is not a surgical candidate. Other history includes carotid arterial disease, peripheral vascular disease, severe pulmonary hypertension, stage IV chronic kidney disease, COPD, chronic anemia, and chronic combined systolic and diastolic heart failure, who presents for follow-up after recent hospitalization for significant anemia, troponin elevation, and volume overload.  Past Medical History    Past Medical History:  Diagnosis Date  . Anemia, chronic disease 05/16/2008   Qualifier: Diagnosis of  By: Percival Spanish, MD, Farrel Gordon    . Bradycardia    a. Limiting BB titration.  [name] CAD (coronary artery disease)    a. NSTEMI 2007 in setting of anemia; LHC 50% LM, 60% LAD, 30% RI, occluded RCA with L-R collaterals, normal EF, treated medically; B. LHC 01/2015 prox-mid RCA 100%, ostial to prox LAD 70%, LM 50%, mid-distal circ 90%-not surgical candidate due to renal and pulmonary issues).  . Carotid artery disease ([place])    a. Duplex 12/2014: left CEA patent with elevated velocities, 60-79% RICA (stable over serial exams), 26-94% LICA stenosis (stable over serial exams), 50% LECA, elevated velocities in bilateral subclavian arteries.  . Chronic combined systolic (congestive) and diastolic (congestive) heart failure ([place])    a. 06/2016 Echo: EF 40-45%, mod MR, mildly dil LA/RA, mod TR, PASP 20mmHg.  . CKD (chronic kidney disease), stage IV ([place])   . COPD (chronic obstructive pulmonary disease) ([facility])    home oxygen  . Dementia   . Depression   . Diabetes mellitus   . Diabetic retinopathy ([place])   . Essential hypertension   . GI AVM (gastrointestinal arteriovenous vascular  malformation)    small bowel  . Hyperlipidemia   . Hypertensive heart disease with congestive heart failure ([place])   . Iron deficiency anemia    a. ? chronic GI blood loss-->plavix d/c'd 06/2016.  [name] Peripheral vascular disease ([place])    a.s /p left common iliac and external iliac artery stents. b. H/o subclavian, mesenteric, and celiac artery stenosis (see below).  . Protein-calorie malnutrition ([place]) 01/24/2015  . Pulmonary hypertension ([place])    a. 06/2016 Echo: PASP 78mmHg.  [name] Renal artery stenosis ([place])    a. Duplex 2014: no obvious evidence of hemodynamically significant stenosis >60%, bilateral intrarenal arteries exhibit absent diastolic flow. There is evidence of elevated velocities of the mid aorta. 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  . Tobacco abuse   . 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 Dieaulafoy's lesion 01/25/2012   Past Surgical History:  Procedure Laterality Date  . APPENDECTOMY    . CARDIAC CATHETERIZATION N/A 01/23/2015   Procedure: Left Heart Cath and Coronary Angiography;  Surgeon: [name], MD;  Location: [facility];  Service: Cardiovascular;  Laterality: N/A;  . CAROTID ENDARTERECTOMY    . CESAREAN SECTION    . CHOLECYSTECTOMY    . COLONOSCOPY WITH ESOPHAGOGASTRODUODENOSCOPY (EGD)  06/22/2016  . ENTEROSCOPY  01/26/2012   Procedure: ENTEROSCOPY;  Surgeon: Gatha Mayer, MD;  Location: [facility] END[facility];  Service: Endoscopy;  Laterality: N/A;  . ENTEROSCOPY N/A 03/25/2015   Procedure: ENTEROSCOPY;  Surgeon: Doran Stabler, MD;  Location: [facility] ENDOSCOPY;  Service: Endoscopy;  Laterality: N/A;  .  ENTEROSCOPY N/A 06/22/2016   Procedure: ENTEROSCOPY;  Surgeon: Jerene Bears, MD;  Location: [facility] ENDOSCOPY;  Service: Endoscopy;  Laterality: N/A;  . ESOPHAGOGASTRODUODENOSCOPY (EGD) WITH PROPOFOL N/A 06/22/2016   Procedure: ESOPHAGOGASTRODUODENOSCOPY (EGD) WITH PROPOFOL;  Surgeon: Jerene Bears, MD;  Location: [facility] ENDOSCOPY;  Service:  Endoscopy;  Laterality: N/A;  . ILIAC ARTERY STENT     left  . SHOULDER ARTHROSCOPY    . TONSILLECTOMY      Allergies  No Known Allergies  History of Present Illness    75 show female with the above complex past medical history including severe coronary artery disease which is managed medically as she is not felt to be a surgical candidate due to renal and pulmonary issues. She also has a history of chronic combined CHF with an EF of 40-45% by most recent echo in May 2018, stage IV chronic kidney disease, tobacco abuse, peripheral vascular disease status post left common iliac and external iliac stenting, mesenteric/celiac/subclavian stenoses, renal artery stenosis, carotid arterial disease status post left carotid endarterectomy, sinus bradycardia, diabetes, COPD, hyperlipidemia, hypertension, GI arteriovenous malformation status post prior cauterization, iron deficiency anemia, CVA in 2010, Louis body dementia, pulmonary nodules with mediastinal and hilar adenopathy, and pulmonary hypertension.  She was recently admitted to [facility] secondary to nausea, vomiting, abdominal pain, and profound anemia with a hemoglobin of 5.2. In that setting, she did have worsening of renal function, elevated LFTs, hypoalbuminemia, hypokalemia, and protein calorie malnutrition. EGD on May 2 showed acute/erosive gastritis with nonbleeding angiectasia and this was treated. She did require transfusions. In the setting of all of this, troponins were elevated with the peak of 0.49. Echo showed an EF of 40-45% with elevated LVEDP, moderate MR, and severe pulmonary hypertension with a PAS P of 80 mmHg. Given known prior severe coronary disease, conservative therapy was recommended. She was not felt to be a candidate for Plavix due to GI bleeding. Aspirin was resumed.  Since discharge, she has done reasonably well. Her weights stable at home. She has not had any chest pain, dyspnea, PND, orthopnea, dizziness, syncope,  edema, or early satiety. She has follow-up with primary care and renal function was improved with a creatinine of 2.12 on May 9.  Home Medications    Prior to Admission medications   Medication Sig Start Date End Date Taking? Authorizing Provider  albuterol (PROVENTIL HFA;VENTOLIN HFA) 108 (90 Base) MCG/ACT inhaler Inhale 2 puffs into the lungs every 6 (six) hours as needed for wheezing or shortness of breath. 06/15/16   Arnoldo Morale, MD  aspirin EC 81 MG tablet Take 81 mg by mouth daily.    [provider]  atorvastatin (LIPITOR) 80 MG tablet TAKE 1 TABLET BY MOUTH  DAILY AT 6 PM. 06/15/16   Arnoldo Morale, MD  Blood Glucose Monitoring Suppl (TRUE METRIX AIR GLUCOSE METER) DEVI 1 Device by Does not apply route 4 (four) times daily -  before meals and at bedtime. 04/22/15   Arnoldo Morale, MD  calcitRIOL (ROCALTROL) 0.5 MCG capsule Take 1 capsule (0.5 mcg total) by mouth daily. 06/15/16   Arnoldo Morale, MD  calcium carbonate (OS-CAL - DOSED IN MG OF ELEMENTAL CALCIUM) 1250 (500 Ca) MG tablet Take 1 tablet (500 mg of elemental calcium total) by mouth 3 (three) times daily with meals. 04/07/15   Hongalgi, Lenis Dickinson, MD  carvedilol (COREG) 3.125 MG tablet TAKE 1 TABLET BY MOUTH TWO  TIMES DAILY WITH A MEAL 06/15/16   Arnoldo Morale, MD  collagenase (  SANTYL) ointment Apply topically daily. Apply to right side of gluteal cleft Unstageable Pressure Injury once daily and PRN soiling. 04/07/15   Hongalgi, Lenis Dickinson, MD  donepezil (ARICEPT) 10 MG tablet Take 1 tablet (10 mg total) by mouth at bedtime. 06/15/16   Arnoldo Morale, MD  ferrous sulfate 325 (65 FE) MG tablet Take 1 tablet (325 mg total) by mouth 2 (two) times daily with a meal. 06/15/16   Arnoldo Morale, MD  fish oil-omega-3 fatty acids 1000 MG capsule Take 1 g by mouth 3 (three) times daily. Reported on 04/09/2015    [provider]  furosemide (LASIX) 80 MG tablet Take 1 tablet (80 mg total) by mouth 2 (two) times daily. 06/15/16   Arnoldo Morale,  MD  glucose blood (TRUE METRIX BLOOD GLUCOSE TEST) test strip Use as instructed 04/22/15   Arnoldo Morale, MD  hydrOXYzine (ATARAX/VISTARIL) 25 MG tablet Take 1 tablet (25 mg total) by mouth 3 (three) times daily as needed. 12/23/15   Arnoldo Morale, MD  isosorbide mononitrate (IMDUR) 30 MG 24 hr tablet Take 1 tablet (30 mg total) by mouth daily. 06/15/16   Arnoldo Morale, MD  Lancet Devices [facility]) lancets Use as instructed daily. 04/21/15   Arnoldo Morale, MD  mometasone-formoterol (DULERA) 100-5 MCG/ACT AERO Inhale 1 puff into the lungs daily. 06/15/16   Arnoldo Morale, MD  nitroGLYCERIN (NITROSTAT) 0.4 MG SL tablet Place 1 tablet (0.4 mg total) under the tongue every 5 (five) minutes as needed for chest pain. 01/28/15   Delfina Redwood, MD  Nutritional Supplements (FEEDING SUPPLEMENT, NEPRO CARB STEADY,) LIQD Take 237 mLs by mouth 2 (two) times daily between meals. Patient not taking: Reported on 03/04/2016 04/07/15   Modena Jansky, MD  pantoprazole (PROTONIX) 40 MG tablet Take 1 tablet (40 mg total) by mouth 2 (two) times daily before a meal. 06/24/16   Sela Hilding, MD  sertraline (ZOLOFT) 50 MG tablet Take 1 tablet (50 mg total) by mouth daily. 06/15/16   Arnoldo Morale, MD    Review of Systems    As above, she has been doing well since her discharge from the hospital.  She denies chest pain, palpitations, dyspnea, pnd, orthopnea, n, v, dizziness, syncope, edema, weight gain, or early satiety.  All other systems reviewed and are otherwise negative except as noted above.  Physical Exam    VS:  BP (!) 148/56   Pulse (!) 49   Ht 4\' 11"  (1.499 m)   Wt 130 lb 3.2 oz (59.1 kg)   BMI 26.30 kg/m  , BMI Body mass index is 26.3 kg/m. GEN: Well nourished, well developed, in no acute distress.  HEENT: normal.  Neck: Supple, no JVD, bilateral left greater than right carotid bruits. Cardiac: RRR, 2/6 systolic ejection murmur at the right upper sternal border, no rubs, or gallops. No  clubbing, cyanosis, trace right lower extremity swelling, 1+ left lower extremity edema.  Radials/DP/PT 1+ and equal bilaterally.  Respiratory:  Respirations regular and unlabored, clear to auscultation bilaterally. GI: Soft, nontender, nondistended, BS + x 4. MS: no deformity or atrophy. Skin: warm and dry, no rash. Neuro:  Strength and sensation are intact. Psych: Normal affect.  Accessory Clinical Findings    Lab Results  Component Value Date   CREATININE 2.12 (H) 06/29/2016   BUN 44 (H) 06/29/2016   NA 141 06/29/2016   K 4.8 06/29/2016   CL 105 06/29/2016   CO2 21 06/29/2016   2D Echocardiogram 5.2.2018 Study Conclusions   -  Left ventricle: Distal septal / apical hypokinesis The cavity   size was mildly dilated. Wall thickness was normal. Systolic   function was mildly to moderately reduced. The estimated ejection   fraction was in the range of 40% to 45%. Doppler parameters are   consistent with both elevated ventricular end-diastolic filling   pressure and elevated left atrial filling pressure. - Aortic valve: Valve area (VTI): 1.07 cm^2. Valve area (Vmax):   1.14 cm^2. Valve area (Vmean): 1.02 cm^2. - Mitral valve: Calcified annulus. There was moderate   regurgitation. - Left atrium: The atrium was mildly dilated. - Right atrium: The atrium was mildly dilated. - Atrial septum: No defect or patent foramen ovale was identified. - Tricuspid valve: There was moderate regurgitation. - Pulmonary arteries: PA peak pressure: 80 mm Hg (S). - Pericardium, extracardiac: Small posterior pericardial effusion   with no tamponade.   Assessment & Plan    1.  Coronary artery disease: Patient recently admitted with significant anemia and mild troponin elevation to 0.49. Echo showed an EF of 40-45% which was down from prior recording. Conservative medical therapy was recommended in the setting of known severe coronary disease. She has not had any chest pain since discharge. She remains  on low-dose aspirin, statin, beta blocker, and nitrate therapy. No changes today.  2. Chronic combined systolic and diastolic congestive heart failure: Patient's weight has been stable at home at approximately 130 pounds. She is currently taking Lasix 80 mg daily. She was advised by nephrology to take an additional dose for swelling. She has not taken medication yet today and does have mild ankle edema. Her daughter will make sure she gets her Lasix when she returns home. Heart rate is stable/bradycardic while blood pressure is elevated this morning. She has not yet taken her morning medications. She is not on an ACE inhibitor, ARB, or ARNI in the setting of stage IV chronic kidney disease. Continue low-dose beta blocker (see dose limited by bradycardia) and nitrate therapy.  3. Chronic anemia with chronic GI blood loss: Followed by primary care and GI.  4. Stage IV chronic kidney disease: Creatinine recently more stable.  5. Severe pulmonary hypertension: Multifactorial setting of known pulmonary and cardiovascular disease. She is currently stable.  6. Essential hypertension: Blood pressure elevated today however she is not yet taking morning medications.  7. Hyperlipidemia: Continue statin therapy.  8. Carotid Arterial dzs:  S/p L CEA.  Bilat bruits.  Due for f/u u/s - will arrange.  9.  Disposition: Follow-up in clinic in approximate 6 weeks to ensure stability.   Murray Hodgkins, NP 07/07/2016, 10:31 AM

## 2016-07-08 ENCOUNTER — Telehealth: Payer: Self-pay

## 2016-07-08 NOTE — Telephone Encounter (Signed)
Call placed to [facility], spoke to [place] and confirmed that the [facility] referral has been received. She noted that the home visit is ready to be scheduled.

## 2016-07-11 DIAGNOSIS — R262 Difficulty in walking, not elsewhere classified: Secondary | ICD-10-CM | POA: Diagnosis not present

## 2016-07-11 DIAGNOSIS — I5020 Unspecified systolic (congestive) heart failure: Secondary | ICD-10-CM | POA: Diagnosis not present

## 2016-07-11 NOTE — Telephone Encounter (Signed)
Please advise on the type of OV needed for this concern.

## 2016-07-11 NOTE — Telephone Encounter (Signed)
MA provided authorized verbal orders for patient to have 2 visits this week instead of one. Medical Assistant left message on patient's home and cell voicemail. Voicemail states to give a call back to [place] with [facility] at [phone].

## 2016-07-12 NOTE — Telephone Encounter (Signed)
Please schedule the patient for a CBC (lab check) BEFORE 07/20/16

## 2016-07-12 NOTE — Telephone Encounter (Signed)
Called and left patient a message asking her to schedule an appt before she sees PCP in June.

## 2016-07-12 NOTE — Telephone Encounter (Signed)
Please schedule her for a CBC which I have ordered. She should keep her appointment with me on 5/30 and labs need to be done before then.

## 2016-07-13 DIAGNOSIS — I5020 Unspecified systolic (congestive) heart failure: Secondary | ICD-10-CM | POA: Diagnosis not present

## 2016-07-13 DIAGNOSIS — R262 Difficulty in walking, not elsewhere classified: Secondary | ICD-10-CM | POA: Diagnosis not present

## 2016-07-14 ENCOUNTER — Encounter: Payer: Medicare Other | Admitting: Family Medicine

## 2016-07-14 ENCOUNTER — Ambulatory Visit: Payer: Medicare Other | Attending: Family Medicine

## 2016-07-14 DIAGNOSIS — K31819 Angiodysplasia of stomach and duodenum without bleeding: Secondary | ICD-10-CM | POA: Insufficient documentation

## 2016-07-14 NOTE — Progress Notes (Signed)
Patient here for lab visit only 

## 2016-07-15 LAB — CBC WITH DIFFERENTIAL/PLATELET
BASOS ABS: 0.1 10*3/uL (ref 0.0–0.2)
BASOS: 2 %
EOS (ABSOLUTE): 0.1 10*3/uL (ref 0.0–0.4)
Eos: 1 %
Hematocrit: 38.7 % (ref 34.0–46.6)
Hemoglobin: 11.8 g/dL (ref 11.1–15.9)
IMMATURE GRANS (ABS): 0 10*3/uL (ref 0.0–0.1)
Immature Granulocytes: 0 %
LYMPHS ABS: 0.8 10*3/uL (ref 0.7–3.1)
Lymphs: 15 %
MCH: 25.8 pg — AB (ref 26.6–33.0)
MCHC: 30.5 g/dL — AB (ref 31.5–35.7)
MCV: 85 fL (ref 79–97)
MONOS ABS: 0.6 10*3/uL (ref 0.1–0.9)
Monocytes: 11 %
NEUTROS ABS: 3.9 10*3/uL (ref 1.4–7.0)
Neutrophils: 71 %
PLATELETS: 229 10*3/uL (ref 150–379)
RBC: 4.57 x10E6/uL (ref 3.77–5.28)
RDW: 21 % — AB (ref 12.3–15.4)
WBC: 5.4 10*3/uL (ref 3.4–10.8)

## 2016-07-15 NOTE — Telephone Encounter (Signed)
Patient verified DOB Patient is aware of anemia being resolved. No further questions at this time.

## 2016-07-15 NOTE — Telephone Encounter (Signed)
-----   Message from Arnoldo Morale, MD sent at 07/15/2016  1:39 PM EDT ----- Anemia has resolved.

## 2016-07-16 IMAGING — XA IR FLUORO GUIDE CV LINE*R*
1 series · 3 of 3 positions shown · IV contrast (IODINE)
Comparison: none

INDICATION: 64-year-old with chronic kidney disease and bacteremia. Request for
tunneled central venous catheter for antibiotics.

[Series 1: body 4 care · 3 of 3 frames shown]
[frame 1/3]
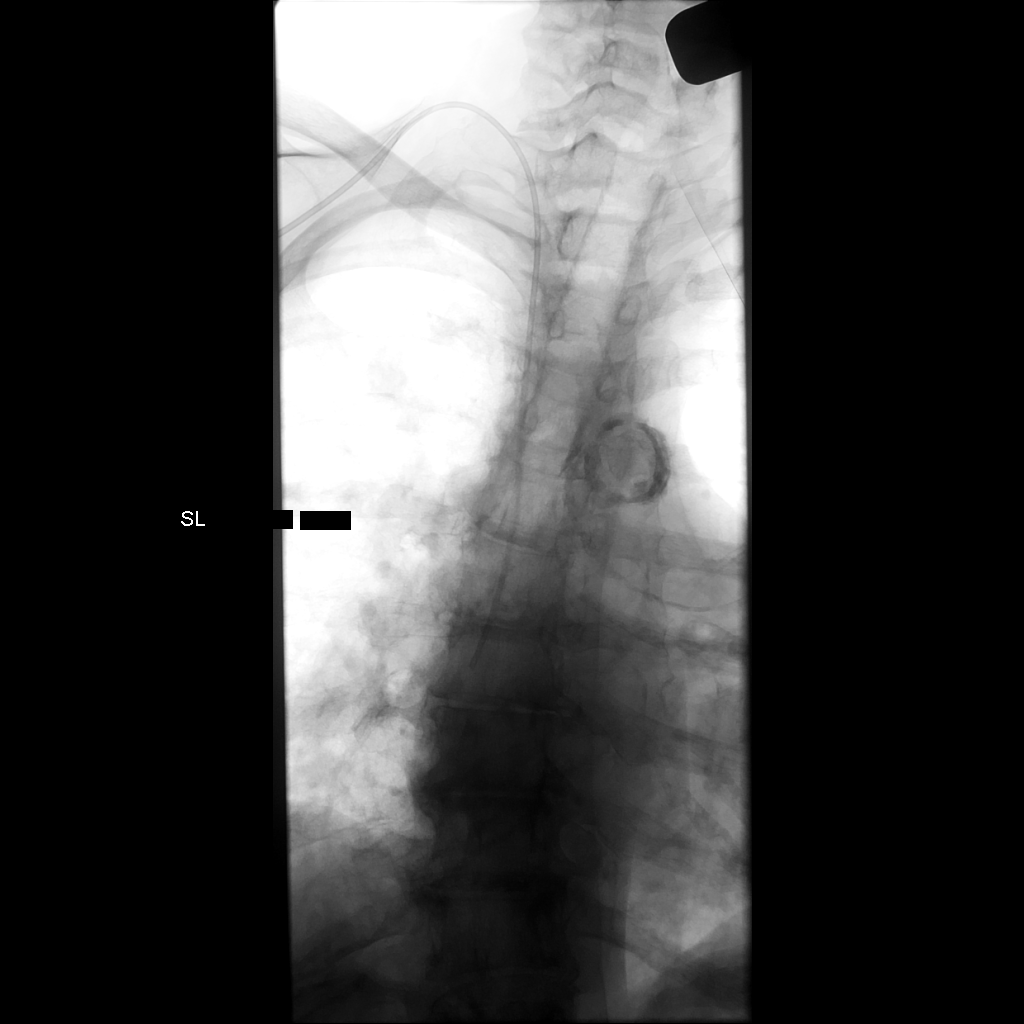
[frame 2/3]
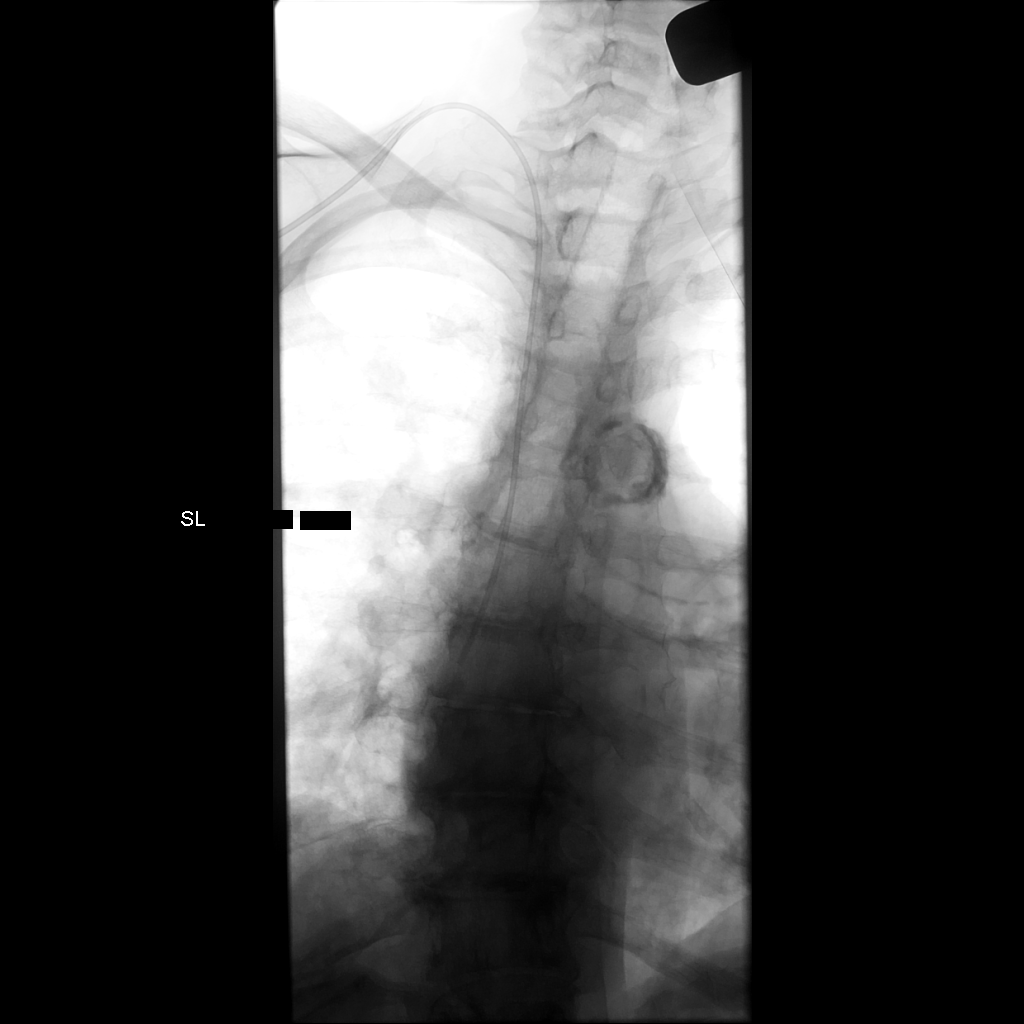
[frame 3/3]
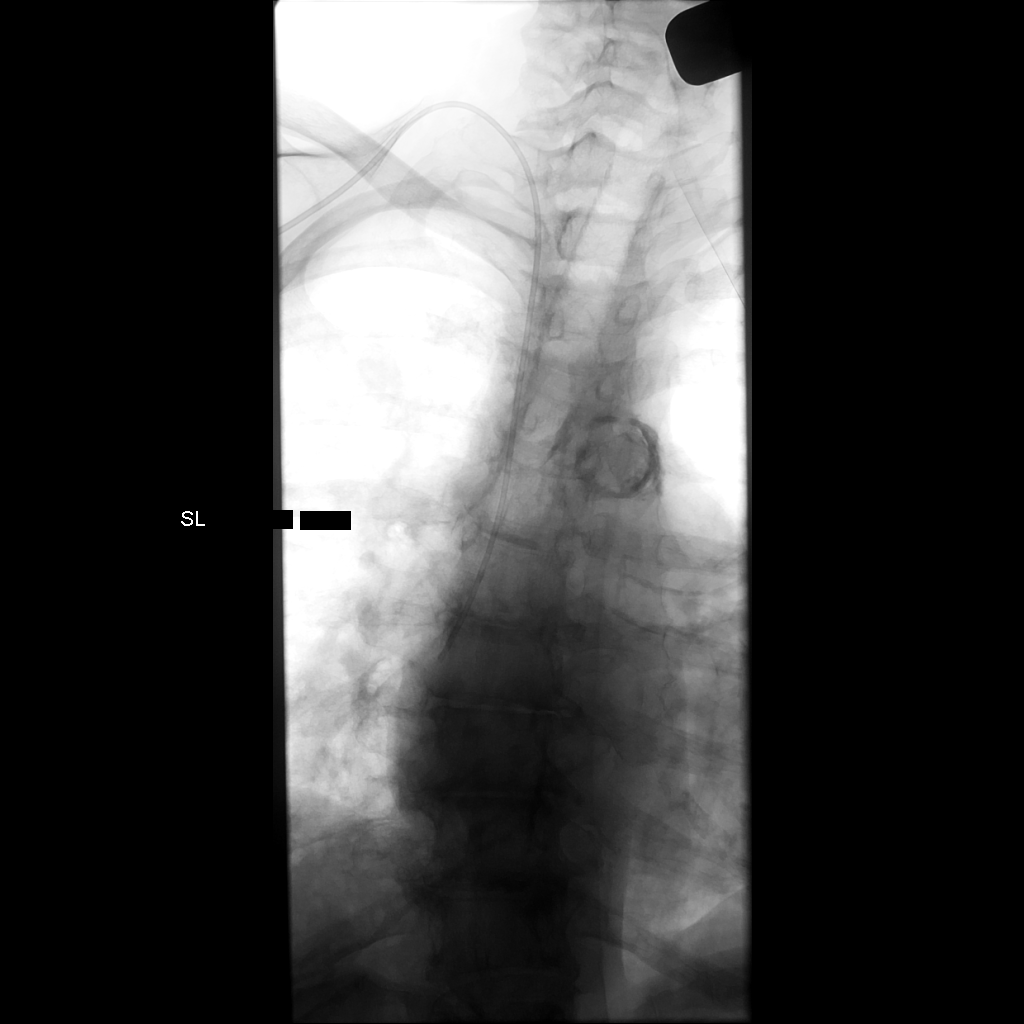

[3 of 3 positions shown; findings below may reference images not displayed]

EXAM:
FLUOROSCOPIC AND ULTRASOUND GUIDED PLACEMENT OF A TUNNELED CENTRAL
VENOUS CATHETER

FLUOROSCOPY TIME:  12 seconds, 0.2 mGy

MEDICATIONS:
Patient is already on antibiotics.

ANESTHESIA/SEDATION:
None

PROCEDURE:
Informed consent was obtained for placement of a tunneled central
venous catheter. The patient was placed supine on the interventional
table. Ultrasound confirmed a patent right internal jugularvein.
Ultrasound images were obtained for documentation. The right side of
the neck was prepped and draped in a sterile fashion. The right side
of the neck was anesthetized with 1% lidocaine. Maximal barrier
sterile technique was utilized including caps, mask, sterile gowns,
sterile gloves, sterile drape, hand hygiene and skin antiseptic. A
small incision was made with #11 blade scalpel. A 21 gauge needle
directed into the right internal jugular vein with ultrasound
guidance. A micropuncture dilator set was placed. A single lumen
Powerline catheter was selected. The skin below the right clavicle
was anesthetized and a small incision was made with an #11 blade
scalpel. A subcutaneous tunnel was formed to the vein dermatotomy
site. The catheter was brought through the tunnel. The vein
dermatotomy site was dilated to accommodate a peel-away sheath. The
catheter was placed through the peel-away sheath and directed into
the central venous structures. The tip of the catheter was placed at
the superior cavoatrial junction with fluoroscopy. Fluoroscopic
images were obtained for documentation. Both lumens were found to
aspirate and flush well. The vein dermatotomy site was closed using
a single layer of absorbable suture and Dermabond. The catheter was
secured to the skin using Prolene suture.
FINDINGS: Catheter tip at the superior cavoatrial junction.

COMPLICATIONS:
None
IMPRESSION: Successful placement of a right jugular tunneled central venous
catheter using ultrasound and fluoroscopic guidance.

## 2016-07-19 ENCOUNTER — Telehealth: Payer: Self-pay

## 2016-07-19 DIAGNOSIS — I5020 Unspecified systolic (congestive) heart failure: Secondary | ICD-10-CM | POA: Diagnosis not present

## 2016-07-19 DIAGNOSIS — R262 Difficulty in walking, not elsewhere classified: Secondary | ICD-10-CM | POA: Diagnosis not present

## 2016-07-19 NOTE — Telephone Encounter (Signed)
Attempted to contact the patient to check on her, inquire about the status of the  [facility] referral and to remind her of her appointment at [facility]  tomorrow - 07/20/16 @1000 . This CM spoke to her daughter, Lavella Lemons, who took a message for the patient to return the call to this CM.

## 2016-07-20 ENCOUNTER — Encounter: Payer: Self-pay | Admitting: Family Medicine

## 2016-07-20 ENCOUNTER — Ambulatory Visit: Payer: Medicare Other | Attending: Family Medicine | Admitting: Family Medicine

## 2016-07-20 VITALS — BP 155/60 | HR 51 | Temp 97.9°F | Wt 125.2 lb

## 2016-07-20 DIAGNOSIS — N184 Chronic kidney disease, stage 4 (severe): Secondary | ICD-10-CM

## 2016-07-20 DIAGNOSIS — R918 Other nonspecific abnormal finding of lung field: Secondary | ICD-10-CM

## 2016-07-20 DIAGNOSIS — R748 Abnormal levels of other serum enzymes: Secondary | ICD-10-CM | POA: Diagnosis not present

## 2016-07-20 DIAGNOSIS — E1151 Type 2 diabetes mellitus with diabetic peripheral angiopathy without gangrene: Secondary | ICD-10-CM | POA: Diagnosis not present

## 2016-07-20 DIAGNOSIS — E785 Hyperlipidemia, unspecified: Secondary | ICD-10-CM | POA: Insufficient documentation

## 2016-07-20 DIAGNOSIS — I25119 Atherosclerotic heart disease of native coronary artery with unspecified angina pectoris: Secondary | ICD-10-CM

## 2016-07-20 DIAGNOSIS — I5032 Chronic diastolic (congestive) heart failure: Secondary | ICD-10-CM | POA: Diagnosis not present

## 2016-07-20 DIAGNOSIS — I252 Old myocardial infarction: Secondary | ICD-10-CM | POA: Diagnosis not present

## 2016-07-20 DIAGNOSIS — E1122 Type 2 diabetes mellitus with diabetic chronic kidney disease: Secondary | ICD-10-CM | POA: Insufficient documentation

## 2016-07-20 DIAGNOSIS — F418 Other specified anxiety disorders: Secondary | ICD-10-CM | POA: Insufficient documentation

## 2016-07-20 DIAGNOSIS — E870 Hyperosmolality and hypernatremia: Secondary | ICD-10-CM | POA: Insufficient documentation

## 2016-07-20 DIAGNOSIS — K209 Esophagitis, unspecified: Secondary | ICD-10-CM | POA: Insufficient documentation

## 2016-07-20 DIAGNOSIS — I130 Hypertensive heart and chronic kidney disease with heart failure and stage 1 through stage 4 chronic kidney disease, or unspecified chronic kidney disease: Secondary | ICD-10-CM | POA: Insufficient documentation

## 2016-07-20 DIAGNOSIS — F0390 Unspecified dementia without behavioral disturbance: Secondary | ICD-10-CM | POA: Diagnosis not present

## 2016-07-20 DIAGNOSIS — I10 Essential (primary) hypertension: Secondary | ICD-10-CM

## 2016-07-20 DIAGNOSIS — J438 Other emphysema: Secondary | ICD-10-CM

## 2016-07-20 DIAGNOSIS — R740 Nonspecific elevation of levels of transaminase and lactic acid dehydrogenase [LDH]: Secondary | ICD-10-CM | POA: Insufficient documentation

## 2016-07-20 DIAGNOSIS — I5042 Chronic combined systolic (congestive) and diastolic (congestive) heart failure: Secondary | ICD-10-CM | POA: Insufficient documentation

## 2016-07-20 DIAGNOSIS — E0800 Diabetes mellitus due to underlying condition with hyperosmolarity without nonketotic hyperglycemic-hyperosmolar coma (NKHHC): Secondary | ICD-10-CM

## 2016-07-20 LAB — GLUCOSE, POCT (MANUAL RESULT ENTRY): POC GLUCOSE: 115 mg/dL — AB (ref 70–99)

## 2016-07-20 MED ORDER — ISOSORBIDE MONONITRATE ER 60 MG PO TB24: 60.0000 mg | ORAL_TABLET | Freq: Every day | ORAL | 3 refills | Status: DC

## 2016-07-20 NOTE — Progress Notes (Signed)
Transitionary care clinic  Date of telephone encounter: 06/24/16  Hospitalization dates: 06/21/16 through 06/24/16  Subjective:    Patient ID: [name], female    DOB: August 03, 1950, 66 y.o.   MRN: [id number]  [name] is a 38 -year-old female with a history of diet-controlled type 2 diabetes mellitus (A1c 6.1), chronic kidney disease stage IV, hypertension, COPD, diastolic CHF (EF 73-22% down from 55-60% Previously) who comes into the clinic for follow-up at the transitional care clinic after hospitalization for anemia.  She had presented to [facility] ED with fatigue, vomiting and hematochezia and was found to have a hemoglobin of 5.2 on presentation for which she received 2 units of PRBC. Upper endoscopy revealed hiatal hernia, esophagitis, superficial ulcerations erosions and she was commenced on PPI to be taken twice daily for 6 weeks and daily thereafter.  2-D echo performed revealed EF of 40-45%, apical hypokinesis, mildly to moderately reduced systolic function, left and right atrium dilatation, elevated PA pressure of 80 mmHg  2-D echo from 03/2015- EF of 02-54%, normal systolic function, normal wall motion, no regional wall motion abnormalities, with 2 diastolic dysfunction, mildly dilated left atrium, moderately increased PA pressure of 62 mmHg   Interval history: Husband has concerns about her need for oxygen but review of her vitals reveal an oxygen saturation of 99% at rest. She denies lightheadedness, hematochezia, chest pains or shortness of breath at rest but does have some dyspnea on moderate exertion. Denies pedal edema. Blood pressure is elevated and she endorses compliance with her antihypertensives; of note she had had her antihypertensive regimen changed during hospitalization. The home health nurse had called the clinic with complaints of dark stools but review of the patient's medications indicate she is on ferrous sulfate which could cause this and her most  recent hemoglobin was negative for anemia with a hemoglobin of 11.8  Past Medical History:  Diagnosis Date  . Anemia, chronic disease 05/16/2008   Qualifier: Diagnosis of  By: Percival Spanish, MD, Farrel Gordon    . Bradycardia    a. Limiting BB titration.  [name] CAD (coronary artery disease)    a. NSTEMI 2007 in setting of anemia; LHC 50% LM, 60% LAD, 30% RI, occluded RCA with L-R collaterals, normal EF, treated medically; B. LHC 01/2015 prox-mid RCA 100%, ostial to prox LAD 70%, LM 50%, mid-distal circ 90%-not surgical candidate due to renal and pulmonary issues).  . Carotid artery disease ([place])    a. Duplex 12/2014: left CEA patent with elevated velocities, 60-79% RICA (stable over serial exams), 27-06% LICA stenosis (stable over serial exams), 50% LECA, elevated velocities in bilateral subclavian arteries.  . Chronic combined systolic (congestive) and diastolic (congestive) heart failure ([place])    a. 06/2016 Echo: EF 40-45%, mod MR, mildly dil LA/RA, mod TR, PASP 80mmHg.  . CKD (chronic kidney disease), stage IV ([place])   . COPD (chronic obstructive pulmonary disease) (HCC)    home oxygen  . Dementia   . Depression   . Diabetes mellitus   . Diabetic retinopathy ([place])   . Essential hypertension   . GI AVM (gastrointestinal arteriovenous vascular malformation)    small bowel  . Hyperlipidemia   . Hypertensive heart disease with congestive heart failure ([place])   . Iron deficiency anemia    a. ? chronic GI blood loss-->plavix d/c'd 06/2016.  [name] Peripheral vascular disease ([place])    a.s /p left common iliac and external iliac artery stents. b. H/o subclavian, mesenteric, and celiac artery  stenosis (see below).  . Protein-calorie malnutrition ([place]) 01/24/2015  . Pulmonary hypertension ([place])    a. 06/2016 Echo: PASP 62mmHg.  [name] Renal artery stenosis ([place])    a. Duplex 2014: no obvious evidence of hemodynamically significant stenosis >60%, bilateral intrarenal arteries exhibit absent diastolic flow. There is  evidence of elevated velocities of the mid aorta. 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  . Tobacco abuse   . 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 APPENDECTOMY    . CARDIAC CATHETERIZATION N/A 01/23/2015   Procedure: Left Heart Cath and Coronary Angiography;  Surgeon: [name], MD;  Location: [facility];  Service: Cardiovascular;  Laterality: N/A;  . CAROTID ENDARTERECTOMY    . CESAREAN SECTION    . CHOLECYSTECTOMY    . COLONOSCOPY WITH ESOPHAGOGASTRODUODENOSCOPY (EGD)  06/22/2016  . ENTEROSCOPY  01/26/2012   Procedure: ENTEROSCOPY;  Surgeon: Gatha Mayer, MD;  Location: [facility] ENDOSCOPY;  Service: Endoscopy;  Laterality: N/A;  . ENTEROSCOPY N/A 03/25/2015   Procedure: ENTEROSCOPY;  Surgeon: Doran Stabler, MD;  Location: [facility] ENDOSCOPY;  Service: Endoscopy;  Laterality: N/A;  . ENTEROSCOPY N/A 06/22/2016   Procedure: ENTEROSCOPY;  Surgeon: Jerene Bears, MD;  Location: [facility] ENDOSCOPY;  Service: Endoscopy;  Laterality: N/A;  . ESOPHAGOGASTRODUODENOSCOPY (EGD) WITH PROPOFOL N/A 06/22/2016   Procedure: ESOPHAGOGASTRODUODENOSCOPY (EGD) WITH PROPOFOL;  Surgeon: Jerene Bears, MD;  Location: [facility] ENDOSCOPY;  Service: Endoscopy;  Laterality: N/A;  . ILIAC ARTERY STENT     left  . SHOULDER ARTHROSCOPY    . TONSILLECTOMY      No Known Allergies    Review of Systems Constitutional: Negative for activity change, appetite change and fatigue.  HENT: Negative for congestion, sinus pressure and sore throat.   Eyes: Negative for visual disturbance.  Respiratory: Negative for cough, chest tightness, shortness of breath and wheezing.   Cardiovascular: Negative for chest pain and palpitations.  Gastrointestinal: Negative for abdominal distention, abdominal pain and constipation.  Endocrine: Negative for polydipsia.  Genitourinary: Negative for dysuria and frequency.    Musculoskeletal: Negative for arthralgias and back pain.  Skin: Negative for rash.  Neurological: Negative for tremors, light-headedness and numbness.  Hematological: Does not bruise/bleed easily.  Psychiatric/Behavioral: Negative for agitation and behavioral problems    Objective: Vitals:   07/20/16 1005  BP: (!) 155/60  Pulse: (!) 51  Temp: 97.9 F (36.6 C)  TempSrc: Oral  SpO2: 99%  Weight: 125 lb 3.2 oz (56.8 kg)      Physical Exam Constitutional: She is oriented to person, place, and time. She appears well-developed and well-nourished.  Cardiovascular: Bradycardic rate, normal heart sounds and intact distal pulses.   No murmur heard. Pulmonary/Chest: Effort normal and breath sounds normal. She has no wheezes. She has no rales. She exhibits no tenderness.  Abdominal: Soft. Bowel sounds are normal. She exhibits no distension and no mass. There is no tenderness.  Musculoskeletal: Normal range of motion. She exhibits no pedal edema  Neurological: She is alert and oriented to person, place, and time.  Skin: Skin is warm and dry.  Psychiatric: She has a normal mood and affect       Assessment & Plan:  1. Diabetes mellitus due to underlying condition with hyperosmolarity without coma, without long-term current use of insulin (HCC) Diet controlled with A1c of 6.1 - POCT glucose (manual entry)  2. Essential hypertension Uncontrolled  Increase isosorbide from 30 mg to 60 mg daily  3. Other emphysema ([place]) Stable, previously on oxygen, no oxygen indicated at this time  4. Chronic diastolic CHF (congestive heart failure) (HCC) Worsened with EF of 40-45% which is down from 55-60% previously Increased PA pressure likely due to underlying COPD Keep upcoming appointment with cardiology in 08/2016  5. Transaminitis Almost resolved  6. Stage 4 chronic kidney disease ([place]) Avoid nephrotoxins Keep appointment with nephrology   7. Dementia without behavioral disturbance,  unspecified dementia type Continue Aricept  8. Coronary artery disease involving native coronary artery of native heart with angina pectoris (HCC) Plavix on hold due to GI bleed Risk factor modification  9. ANXIETY DEPRESSION Stable  10. Acute esophagitis Continue PPI 2 weeks until 09/05/16 at which time she will be switched to once daily.  11. Lung nodules Stable Repeat CT in 6-12 months - due in 05/2017

## 2016-07-20 NOTE — Patient Instructions (Signed)
Increase isosorbide to 60 mg daily

## 2016-07-22 DIAGNOSIS — J449 Chronic obstructive pulmonary disease, unspecified: Secondary | ICD-10-CM | POA: Diagnosis not present

## 2016-07-25 ENCOUNTER — Emergency Department (HOSPITAL_COMMUNITY)
Admission: EM | Admit: 2016-07-25 | Discharge: 2016-07-25 | Disposition: A | Discharge status: Home / Self Care | Payer: Medicare Other | Attending: Emergency Medicine | Admitting: Emergency Medicine

## 2016-07-25 ENCOUNTER — Encounter (HOSPITAL_COMMUNITY): Payer: Self-pay | Admitting: *Deleted

## 2016-07-25 DIAGNOSIS — N184 Chronic kidney disease, stage 4 (severe): Secondary | ICD-10-CM | POA: Diagnosis not present

## 2016-07-25 DIAGNOSIS — E1122 Type 2 diabetes mellitus with diabetic chronic kidney disease: Secondary | ICD-10-CM | POA: Diagnosis not present

## 2016-07-25 DIAGNOSIS — J449 Chronic obstructive pulmonary disease, unspecified: Secondary | ICD-10-CM | POA: Insufficient documentation

## 2016-07-25 DIAGNOSIS — I252 Old myocardial infarction: Secondary | ICD-10-CM | POA: Diagnosis not present

## 2016-07-25 DIAGNOSIS — I5043 Acute on chronic combined systolic (congestive) and diastolic (congestive) heart failure: Secondary | ICD-10-CM | POA: Diagnosis not present

## 2016-07-25 DIAGNOSIS — Z79899 Other long term (current) drug therapy: Secondary | ICD-10-CM | POA: Diagnosis not present

## 2016-07-25 DIAGNOSIS — M542 Cervicalgia: Secondary | ICD-10-CM | POA: Diagnosis present

## 2016-07-25 DIAGNOSIS — I130 Hypertensive heart and chronic kidney disease with heart failure and stage 1 through stage 4 chronic kidney disease, or unspecified chronic kidney disease: Secondary | ICD-10-CM | POA: Insufficient documentation

## 2016-07-25 DIAGNOSIS — I2510 Atherosclerotic heart disease of native coronary artery without angina pectoris: Secondary | ICD-10-CM | POA: Insufficient documentation

## 2016-07-25 DIAGNOSIS — M5412 Radiculopathy, cervical region: Secondary | ICD-10-CM | POA: Diagnosis not present

## 2016-07-25 DIAGNOSIS — E11319 Type 2 diabetes mellitus with unspecified diabetic retinopathy without macular edema: Secondary | ICD-10-CM | POA: Diagnosis not present

## 2016-07-25 DIAGNOSIS — F17210 Nicotine dependence, cigarettes, uncomplicated: Secondary | ICD-10-CM | POA: Diagnosis not present

## 2016-07-25 DIAGNOSIS — Z7982 Long term (current) use of aspirin: Secondary | ICD-10-CM | POA: Diagnosis not present

## 2016-07-25 MED ORDER — LORAZEPAM 0.5 MG PO TABS
0.5000 mg | ORAL_TABLET | Freq: Once | ORAL | Status: AC
  Administered 2016-07-25: 0.5 mg via ORAL
  Filled 2016-07-25: qty 1

## 2016-07-25 MED ORDER — KETOROLAC TROMETHAMINE 15 MG/ML IJ SOLN
15.0000 mg | Freq: Once | INTRAMUSCULAR | Status: AC
  Administered 2016-07-25: 15 mg via INTRAMUSCULAR
  Filled 2016-07-25: qty 1

## 2016-07-25 MED ORDER — TRAMADOL HCL 50 MG PO TABS: 50.0000 mg | ORAL_TABLET | Freq: Three times a day (TID) | ORAL | 0 refills | Status: DC | PRN

## 2016-07-25 MED ORDER — MORPHINE SULFATE (PF) 4 MG/ML IV SOLN
4.0000 mg | Freq: Once | INTRAVENOUS | Status: AC
  Administered 2016-07-25: 4 mg via INTRAMUSCULAR
  Filled 2016-07-25: qty 1

## 2016-07-25 NOTE — ED Triage Notes (Signed)
Pt reports left side neck pain. Hx of same but pain more severe x 2 days. Denies new injury.

## 2016-07-25 NOTE — ED Provider Notes (Signed)
[facility] DEPT Provider Note   CSN: [id number] Arrival date & time: 07/25/16  1025  By signing my name below, I, Levester Fresh, attest that this documentation has been prepared under the direction and in the presence of Virgel Manifold, MD . Electronically Signed: Levester Fresh, Scribe. 07/25/2016. 12:29 PM.  History   Chief Complaint Chief Complaint  Patient presents with  . Neck Pain   HPI Comments [name] is a 66 y.o. female with multiple medical co-morbidities, including a PMHx significant for arthritis, CAD, NSTEMI, heart failure, dementia, DM, CKD, DM, HTN, HLD and peripheral vascular disease s/p left common iliac and external iliac artery stents, who presents to the Emergency Department with complaints of left sided neck pain x2 days.  No radiation to her shoulders or arms. Pain worse with movement.  Reports some numbness down left arm to fingers, but she is able to use her arm normally.  No weakness of the LUE.  Sensation intact to fingers.  She has experienced some relief with OTC tylenol and ibuprofen.  No reported injury, but pt does note a hx of falls which exacerbated sx.  Pt denies experiencing any other acute sx, including dyspnea, chest pain, fevers or chills.   The history is provided by the patient, the spouse and medical records. No language interpreter was used.   Past Medical History:  Diagnosis Date  . Anemia, chronic disease 05/16/2008   Qualifier: Diagnosis of  By: Percival Spanish, MD, Farrel Gordon    . Bradycardia    a. Limiting BB titration.  [name] CAD (coronary artery disease)    a. NSTEMI 2007 in setting of anemia; LHC 50% LM, 60% LAD, 30% RI, occluded RCA with L-R collaterals, normal EF, treated medically; B. LHC 01/2015 prox-mid RCA 100%, ostial to prox LAD 70%, LM 50%, mid-distal circ 90%-not surgical candidate due to renal and pulmonary issues).  . Carotid artery disease ([place])    a. Duplex 12/2014: left CEA patent with elevated velocities, 60-79% RICA  (stable over serial exams), 74-25% LICA stenosis (stable over serial exams), 50% LECA, elevated velocities in bilateral subclavian arteries.  . Chronic combined systolic (congestive) and diastolic (congestive) heart failure ([place])    a. 06/2016 Echo: EF 40-45%, mod MR, mildly dil LA/RA, mod TR, PASP 85mmHg.  . CKD (chronic kidney disease), stage IV ([place])   . COPD (chronic obstructive pulmonary disease) (HCC)    home oxygen  . Dementia   . Depression   . Diabetes mellitus   . Diabetic retinopathy ([place])   . Essential hypertension   . GI AVM (gastrointestinal arteriovenous vascular malformation)    small bowel  . Hyperlipidemia   . Hypertensive heart disease with congestive heart failure ([place])   . Iron deficiency anemia    a. ? chronic GI blood loss-->plavix d/c'd 06/2016.  [name] Peripheral vascular disease ([place])    a.s /p left common iliac and external iliac artery stents. b. H/o subclavian, mesenteric, and celiac artery stenosis (see below).  . Protein-calorie malnutrition ([place]) 01/24/2015  . Pulmonary hypertension ([place])    a. 06/2016 Echo: PASP 26mmHg.  [name] Renal artery stenosis ([place])    a. Duplex 2014: no obvious evidence of hemodynamically significant stenosis >60%, bilateral intrarenal arteries exhibit absent diastolic flow. There is evidence of elevated velocities of the mid aorta. 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  . Tobacco abuse   . 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 Dieaulafoy's lesion 01/25/2012    Patient Active  Problem List   Diagnosis Date Noted  . Lung nodules 06/29/2016  . Acute kidney injury superimposed on chronic kidney disease ([place]) 06/23/2016  . Acute on chronic combined systolic and diastolic CHF (congestive heart failure) ([place]) 06/23/2016  . Hypokalemia 06/23/2016  . Acute esophagitis   . Angiodysplasia of duodenum   . Anemia 06/21/2016  . Cardiac enzymes elevated   . Elevated liver enzymes   . Transaminitis   . Demand ischemia  ([place])   . Coffee ground emesis   . Heme positive stool   . Dementia 03/04/2016  . CAP (community acquired pneumonia) 05/13/2015  . Indwelling catheter present on admission 04/21/2015  . Difficulty breathing   . Pulmonary infiltrates   . Staphylococcus aureus bacteremia   . Palliative care encounter   . Malnutrition of moderate degree 03/30/2015  . HCAP (healthcare-associated pneumonia) 03/29/2015  . Acute respiratory failure ([place]) 03/29/2015  . Hypomagnesemia 03/29/2015  . Low serum vitamin D 03/29/2015  . Metabolic acidosis 99/83/3825  . Coronary artery disease involving native coronary artery of native heart with angina pectoris ([place])   . Sacral ulcer ([place])   . Urinary retention   . Hypocalcemia 03/20/2015  . Lewy body dementia without behavioral disturbance   . Protein-calorie malnutrition ([place]) 01/24/2015  . Abnormal nuclear stress test   . Stage 4 chronic kidney disease ([place])   . Carotid artery disease ([place])   . CAD (coronary artery disease)   . Chronic diastolic CHF (congestive heart failure) ([place])   . Essential hypertension   . Former tobacco use   . Bradycardia   . Nausea & vomiting 01/26/2012  . 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 Diet-controlled diabetes mellitus ([place]) 01/25/2012  . Iron deficiency anemia 01/25/2012  . Renal artery stenosis ([place]) 11/19/2010  . Peripheral vascular disease ([place]) 11/19/2010  . Diabetes ([place]) 05/16/2008  . Hyperlipidemia 05/16/2008  . OBESITY 05/16/2008  . Anemia, chronic disease 05/16/2008  . ANXIETY DEPRESSION 05/16/2008  . History of CVA (cerebrovascular accident) 05/16/2008  . Chronic obstructive pulmonary disease ([place]) 05/16/2008  . DIVERTICULITIS, HX OF 05/16/2008    Past Surgical History:  Procedure Laterality Date  . APPENDECTOMY    . CARDIAC CATHETERIZATION N/A 01/23/2015   Procedure: Left Heart Cath and Coronary Angiography;  Surgeon: [name], MD;  Location: [facility];  Service: Cardiovascular;  Laterality: N/A;  . CAROTID ENDARTERECTOMY    . CESAREAN SECTION    . CHOLECYSTECTOMY    . COLONOSCOPY WITH ESOPHAGOGASTRODUODENOSCOPY (EGD)  06/22/2016  . ENTEROSCOPY  01/26/2012   Procedure: ENTEROSCOPY;  Surgeon: Gatha Mayer, MD;  Location: [facility] ENDOSCOPY;  Service: Endoscopy;  Laterality: N/A;  . ENTEROSCOPY N/A 03/25/2015   Procedure: ENTEROSCOPY;  Surgeon: Doran Stabler, MD;  Location: [facility] ENDOSCOPY;  Service: Endoscopy;  Laterality: N/A;  . ENTEROSCOPY N/A 06/22/2016   Procedure: ENTEROSCOPY;  Surgeon: Jerene Bears, MD;  Location: [facility] ENDOSCOPY;  Service: Endoscopy;  Laterality: N/A;  . ESOPHAGOGASTRODUODENOSCOPY (EGD) WITH PROPOFOL N/A 06/22/2016   Procedure: ESOPHAGOGASTRODUODENOSCOPY (EGD) WITH PROPOFOL;  Surgeon: Jerene Bears, MD;  Location: [facility] ENDOSCOPY;  Service: Endoscopy;  Laterality: N/A;  . ILIAC ARTERY STENT     left  . SHOULDER ARTHROSCOPY    . TONSILLECTOMY      OB History    No data available       Home Medications    Prior to Admission medications   Medication Sig Start Date End Date Taking?  Authorizing Provider  acetaminophen (TYLENOL) 325 MG tablet Take 325 mg by mouth every 6 (six) hours as needed.    [provider]  albuterol (PROVENTIL HFA;VENTOLIN HFA) 108 (90 Base) MCG/ACT inhaler Inhale 2 puffs into the lungs every 6 (six) hours as needed for wheezing or shortness of breath. 06/15/16   Arnoldo Morale, MD  aspirin EC 81 MG tablet Take 81 mg by mouth daily.    [provider]  atorvastatin (LIPITOR) 80 MG tablet TAKE 1 TABLET BY MOUTH  DAILY AT 6 PM. 06/15/16   Arnoldo Morale, MD  Blood Glucose Monitoring Suppl (TRUE METRIX AIR GLUCOSE METER) DEVI 1 Device by Does not apply route 4 (four) times daily -  before meals and at bedtime. 04/22/15   Arnoldo Morale, MD  calcitRIOL (ROCALTROL) 0.5 MCG capsule Take 1 capsule (0.5 mcg total) by mouth daily. 06/15/16   Arnoldo Morale, MD  calcium carbonate (OS-CAL - DOSED IN MG  OF ELEMENTAL CALCIUM) 1250 (500 Ca) MG tablet Take 1 tablet (500 mg of elemental calcium total) by mouth 3 (three) times daily with meals. 04/07/15   Hongalgi, Lenis Dickinson, MD  carvedilol (COREG) 3.125 MG tablet TAKE 1 TABLET BY MOUTH TWO  TIMES DAILY WITH A MEAL 06/15/16   Arnoldo Morale, MD  clopidogrel (PLAVIX) 75 MG tablet Take 1 tablet by mouth daily with breakfast. 07/05/16   [provider]  collagenase (SANTYL) ointment Apply topically daily. Apply to right side of gluteal cleft Unstageable Pressure Injury once daily and PRN soiling. 04/07/15   Hongalgi, Lenis Dickinson, MD  donepezil (ARICEPT) 10 MG tablet Take 1 tablet (10 mg total) by mouth at bedtime. 06/15/16   Arnoldo Morale, MD  ferrous sulfate 325 (65 FE) MG tablet Take 1 tablet (325 mg total) by mouth 2 (two) times daily with a meal. 06/15/16   Arnoldo Morale, MD  fish oil-omega-3 fatty acids 1000 MG capsule Take 1 g by mouth 3 (three) times daily. Reported on 04/09/2015    [provider]  furosemide (LASIX) 80 MG tablet Take 1 tablet (80 mg total) by mouth 2 (two) times daily. 06/15/16   Arnoldo Morale, MD  glucose blood (TRUE METRIX BLOOD GLUCOSE TEST) test strip Use as instructed 04/22/15   Arnoldo Morale, MD  hydrOXYzine (ATARAX/VISTARIL) 25 MG tablet Take 1 tablet (25 mg total) by mouth 3 (three) times daily as needed. 12/23/15   Arnoldo Morale, MD  isosorbide mononitrate (IMDUR) 60 MG 24 hr tablet Take 1 tablet (60 mg total) by mouth daily. 07/20/16   Arnoldo Morale, MD  Lancet Devices [facility]) lancets Use as instructed daily. 04/21/15   Arnoldo Morale, MD  mometasone-formoterol (DULERA) 100-5 MCG/ACT AERO Inhale 1 puff into the lungs daily. 06/15/16   Arnoldo Morale, MD  nitroGLYCERIN (NITROSTAT) 0.4 MG SL tablet Place 0.4 mg under the tongue every 5 (five) minutes as needed for chest pain. X 3 doses    [provider]  Nutritional Supplements (FEEDING SUPPLEMENT, NEPRO CARB STEADY,) LIQD Take 237 mLs by mouth 2 (two) times  daily between meals. 04/07/15   Hongalgi, Lenis Dickinson, MD  pantoprazole (PROTONIX) 40 MG tablet Take 1 tablet (40 mg total) by mouth 2 (two) times daily before a meal. 06/24/16   Sela Hilding, MD  sertraline (ZOLOFT) 50 MG tablet Take 1 tablet (50 mg total) by mouth daily. 06/15/16   Arnoldo Morale, MD    Family History Family History  Problem Relation Age of Onset  . Hypertension Mother   .  Heart attack Mother   . Hypertension Father   . Angina Father   . Cancer Sister   . Cancer Brother   . Stroke Neg Hx     Social History Social History  Substance Use Topics  . Smoking status: Current Every Day Smoker    Packs/day: 1.00    Years: 36.00    Types: Cigarettes  . Smokeless tobacco: Never Used  . Alcohol use No     Allergies   Patient has no known allergies.   Review of Systems Review of Systems  Constitutional: Negative for chills and fever.  Respiratory: Negative for shortness of breath.   Cardiovascular: Negative for chest pain.  Musculoskeletal: Positive for neck pain.  Neurological: Positive for numbness. Negative for weakness.  All other systems reviewed and are negative.    Physical Exam Updated Vital Signs BP (!) 141/52 (BP Location: Left Arm)   Pulse (!) 58   Temp 97.9 F (36.6 C) (Oral)   Resp 18   SpO2 97%   Physical Exam  Constitutional: She is oriented to person, place, and time. She appears well-developed and well-nourished. No distress.  HENT:  Head: Normocephalic and atraumatic.  Eyes: EOM are normal.  Neck:  Surgical scar left lateral neck. Tenderness to palpation of the muscular of the left neck. Increased pain with neck flexion and rotation.  Cardiovascular: Normal rate, regular rhythm and normal heart sounds.   Pulmonary/Chest: Effort normal and breath sounds normal.  Abdominal: Soft.  Musculoskeletal: Normal range of motion.  Neurological: She is alert and oriented to person, place, and time.  Neurovascularly intact in upper  extremities.  Skin: Skin is warm and dry.  Psychiatric: She has a normal mood and affect. Judgment normal.  Nursing note and vitals reviewed.  ED Treatments / Results  DIAGNOSTIC STUDIES: Oxygen Saturation is 97% on room air, adequate by my interpretation.    COORDINATION OF CARE: 12:18 PM Discussed treatment plan with pt at bedside and pt agreed to plan.  Labs (all labs ordered are listed, but only abnormal results are displayed) Labs Reviewed - No data to display  EKG  EKG Interpretation None       Radiology No results found.  Procedures Procedures (including critical care time)  Medications Ordered in ED Medications - No data to display   Initial Impression / Assessment and Plan / ED Course  I have reviewed the triage vital signs and the nursing notes.  Pertinent labs & imaging results that were available during my care of the patient were reviewed by me and considered in my medical decision making (see chart for details).     66yF with L lateral neck pain. She reports hx of neck surgery. Location of scar and history consistent with endarterectomy. Doesn't appear has had cervical spine surgery. Regardless, I doubt and emergent cause. Symptoms seem most consistent with cervical radiculopathy. She is NVI. Will tx symptoms. Did get a dose of toradol in ED. On further review of records though, has CKD. Advised to avoid regular NSAID usage. Script for PRN tramadol for a few days.   It has been determined that no acute conditions requiring further emergency intervention are present at this time. The patient has been advised of the diagnosis and plan. I reviewed any labs and imaging including any potential incidental findings. We have discussed signs and symptoms that warrant return to the ED and they are listed in the discharge instructions.   I personally preformed the services scribed in my presence.  The recorded information has been reviewed is accurate. Virgel Manifold,  MD.   Final Clinical Impressions(s) / ED Diagnoses   Final diagnoses:  Cervical radiculopathy    New Prescriptions New Prescriptions   No medications on file     Virgel Manifold, MD 07/25/16 1243

## 2016-07-25 NOTE — Addendum Note (Signed)
Addendum  created 07/25/16 1441 by Travious Vanover, MD   Sign clinical note    

## 2016-07-29 ENCOUNTER — Telehealth: Payer: Self-pay | Admitting: Family Medicine

## 2016-07-29 NOTE — Telephone Encounter (Signed)
Called and left patient a message asking her to contact Opal Sidles or I to inform us of her status and inquire about the [facility] evaluation.

## 2016-08-01 ENCOUNTER — Telehealth: Payer: Self-pay | Admitting: Family Medicine

## 2016-08-01 NOTE — Telephone Encounter (Signed)
Contacted patient at her new number [phone]) to check on her status as well as the status of the PCS. Pt stated that she is doing ok and that the nurse has been visiting her. In regards to [facility], patient didn't know. Informed her that I would contact them to get an answer. I reminded patient of her upcoming appt on 6/22 with Dr. Jarold Song.

## 2016-08-01 NOTE — Telephone Encounter (Signed)
[employer] [phone]) and spoke with Claudine. She informed me that patient had the assessment for PCS on May th 25 and was denied service.

## 2016-08-02 ENCOUNTER — Encounter: Payer: Self-pay | Admitting: Internal Medicine

## 2016-08-08 ENCOUNTER — Other Ambulatory Visit: Payer: Self-pay | Admitting: Family Medicine

## 2016-08-08 DIAGNOSIS — F341 Dysthymic disorder: Secondary | ICD-10-CM

## 2016-08-08 MED FILL — ?HYDROXYZINE HCL 25 MG TAB: 25 MG | 20 days supply | Qty: 60 | Fill #2

## 2016-08-08 MED FILL — FERROUS SULFATE 325 MG TAB: 325 (65 FE) | 30 days supply | Qty: 90 | Fill #2

## 2016-08-09 ENCOUNTER — Telehealth: Payer: Self-pay | Admitting: Family Medicine

## 2016-08-09 NOTE — Telephone Encounter (Signed)
Called placed to [phone] and spoke with patient to check on her status and remind her of her upcoming appt. Patient stated that she was doing "good". Reminded her of her appointment with provider on Friday. Pt doesn't need transportation. Her daughter will bring her. Reminded patient to bring medication with her.

## 2016-08-12 ENCOUNTER — Encounter: Payer: Self-pay | Admitting: Family Medicine

## 2016-08-12 ENCOUNTER — Ambulatory Visit: Payer: Medicare Other | Attending: Family Medicine | Admitting: Family Medicine

## 2016-08-12 VITALS — BP 180/60 | HR 52 | Temp 97.8°F | Wt 119.2 lb

## 2016-08-12 DIAGNOSIS — E1122 Type 2 diabetes mellitus with diabetic chronic kidney disease: Secondary | ICD-10-CM | POA: Diagnosis not present

## 2016-08-12 DIAGNOSIS — E0800 Diabetes mellitus due to underlying condition with hyperosmolarity without nonketotic hyperglycemic-hyperosmolar coma (NKHHC): Secondary | ICD-10-CM | POA: Diagnosis not present

## 2016-08-12 DIAGNOSIS — I252 Old myocardial infarction: Secondary | ICD-10-CM | POA: Diagnosis not present

## 2016-08-12 DIAGNOSIS — F0390 Unspecified dementia without behavioral disturbance: Secondary | ICD-10-CM | POA: Diagnosis not present

## 2016-08-12 DIAGNOSIS — E785 Hyperlipidemia, unspecified: Secondary | ICD-10-CM | POA: Diagnosis not present

## 2016-08-12 DIAGNOSIS — E1151 Type 2 diabetes mellitus with diabetic peripheral angiopathy without gangrene: Secondary | ICD-10-CM | POA: Diagnosis not present

## 2016-08-12 DIAGNOSIS — E870 Hyperosmolality and hypernatremia: Secondary | ICD-10-CM | POA: Diagnosis not present

## 2016-08-12 DIAGNOSIS — I5032 Chronic diastolic (congestive) heart failure: Secondary | ICD-10-CM | POA: Diagnosis not present

## 2016-08-12 DIAGNOSIS — J449 Chronic obstructive pulmonary disease, unspecified: Secondary | ICD-10-CM | POA: Insufficient documentation

## 2016-08-12 DIAGNOSIS — I25119 Atherosclerotic heart disease of native coronary artery with unspecified angina pectoris: Secondary | ICD-10-CM | POA: Diagnosis not present

## 2016-08-12 DIAGNOSIS — E7800 Pure hypercholesterolemia, unspecified: Secondary | ICD-10-CM | POA: Diagnosis not present

## 2016-08-12 DIAGNOSIS — D638 Anemia in other chronic diseases classified elsewhere: Secondary | ICD-10-CM | POA: Insufficient documentation

## 2016-08-12 DIAGNOSIS — Z7902 Long term (current) use of antithrombotics/antiplatelets: Secondary | ICD-10-CM | POA: Diagnosis not present

## 2016-08-12 DIAGNOSIS — F341 Dysthymic disorder: Secondary | ICD-10-CM | POA: Diagnosis not present

## 2016-08-12 DIAGNOSIS — K209 Esophagitis, unspecified: Secondary | ICD-10-CM | POA: Insufficient documentation

## 2016-08-12 DIAGNOSIS — R918 Other nonspecific abnormal finding of lung field: Secondary | ICD-10-CM | POA: Insufficient documentation

## 2016-08-12 DIAGNOSIS — I272 Pulmonary hypertension, unspecified: Secondary | ICD-10-CM | POA: Insufficient documentation

## 2016-08-12 DIAGNOSIS — N184 Chronic kidney disease, stage 4 (severe): Secondary | ICD-10-CM | POA: Insufficient documentation

## 2016-08-12 DIAGNOSIS — I10 Essential (primary) hypertension: Secondary | ICD-10-CM

## 2016-08-12 DIAGNOSIS — J438 Other emphysema: Secondary | ICD-10-CM | POA: Diagnosis not present

## 2016-08-12 DIAGNOSIS — F418 Other specified anxiety disorders: Secondary | ICD-10-CM | POA: Insufficient documentation

## 2016-08-12 DIAGNOSIS — I130 Hypertensive heart and chronic kidney disease with heart failure and stage 1 through stage 4 chronic kidney disease, or unspecified chronic kidney disease: Secondary | ICD-10-CM | POA: Insufficient documentation

## 2016-08-12 LAB — GLUCOSE, POCT (MANUAL RESULT ENTRY): POC Glucose: 101 mg/dl — AB (ref 70–99)

## 2016-08-12 MED ORDER — SERTRALINE HCL 50 MG PO TABS: 50.0000 mg | ORAL_TABLET | Freq: Every day | ORAL | 1 refills | Status: AC

## 2016-08-12 MED ORDER — HYDRALAZINE HCL 50 MG PO TABS: 50.0000 mg | ORAL_TABLET | Freq: Three times a day (TID) | ORAL | 3 refills | Status: DC

## 2016-08-12 MED ORDER — ATORVASTATIN CALCIUM 80 MG PO TABS: ORAL_TABLET | ORAL | 1 refills | Status: AC

## 2016-08-12 NOTE — Progress Notes (Signed)
Subjective:    Patient ID: [name], female    DOB: Feb 13, 1951, 66 y.o.   MRN: [id number]  HPI [name] is a 66 -year-old female with a history of diet-controlled type 2 diabetes mellitus (A1c 6.1), chronic kidney disease stage IV, hypertension, COPD, diastolic CHF (EF 16-07% down from 55-60% Previously) who comes into the clinic for a follow-up visit. Her blood pressure is  elevated at 180/60 and she endorses compliance with all her antihypertensives : Carvedilol, isosorbide and has been taking nifedipine (of note nifedipine had been discontinued during her hospitalization) At her last visit I had increased the dose of isosorbide from 30 mg to 60 mg.  She had a hospitalization last month for lower GI bleed and was found to have a hemoglobin of 5.2 on presentation for which she received 2 units of PRBC. Upper endoscopy revealed hiatal hernia, esophagitis, superficial ulcerations erosions and she was commenced on PPI to be taken twice daily for 6 weeks and daily thereafter. Today she denies lightheadedness, no stools, abdominal pain, diarrhea or vomiting.  2-D echo performed revealed EF of 40-45%, apical hypokinesis, mildly to moderately reduced systolic function, left and right atrium dilatation, elevated PA pressure of 80 mmHg  2-D echo from 03/2015- EF of 37-10%, normal systolic function, normal wall motion, no regional wall motion abnormalities, with 2 diastolic dysfunction, mildly dilated left atrium, moderately increased PA pressure of 62 mmHg  She is accompanied by her husband for today's visit and has no concerns today.   Past Medical History:  Diagnosis Date  . Anemia, chronic disease 05/16/2008   Qualifier: Diagnosis of  By: Percival Spanish, MD, Farrel Gordon    . Bradycardia    a. Limiting BB titration.  [name] CAD (coronary artery disease)    a. NSTEMI 2007 in setting of anemia; LHC 50% LM, 60% LAD, 30% RI, occluded RCA with L-R collaterals, normal EF, treated medically; B. LHC  01/2015 prox-mid RCA 100%, ostial to prox LAD 70%, LM 50%, mid-distal circ 90%-not surgical candidate due to renal and pulmonary issues).  . Carotid artery disease ([place])    a. Duplex 12/2014: left CEA patent with elevated velocities, 60-79% RICA (stable over serial exams), 62-69% LICA stenosis (stable over serial exams), 50% LECA, elevated velocities in bilateral subclavian arteries.  . Chronic combined systolic (congestive) and diastolic (congestive) heart failure ([place])    a. 06/2016 Echo: EF 40-45%, mod MR, mildly dil LA/RA, mod TR, PASP 63mmHg.  . CKD (chronic kidney disease), stage IV ([place])   . COPD (chronic obstructive pulmonary disease) (HCC)    home oxygen  . Dementia   . Depression   . Diabetes mellitus   . Diabetic retinopathy ([place])   . Essential hypertension   . GI AVM (gastrointestinal arteriovenous vascular malformation)    small bowel  . Hyperlipidemia   . Hypertensive heart disease with congestive heart failure ([place])   . Iron deficiency anemia    a. ? chronic GI blood loss-->plavix d/c'd 06/2016.  [name] Peripheral vascular disease ([place])    a.s /p left common iliac and external iliac artery stents. b. H/o subclavian, mesenteric, and celiac artery stenosis (see below).  . Protein-calorie malnutrition ([place]) 01/24/2015  . Pulmonary hypertension ([place])    a. 06/2016 Echo: PASP 63mmHg.  [name] Renal artery stenosis ([place])    a. Duplex 2014: no obvious evidence of hemodynamically significant stenosis >60%, bilateral intrarenal arteries exhibit absent diastolic flow. There is evidence of elevated velocities of the mid aorta. There is  celiac artery and superior mesenteric artery stenosis >70%.  . Tobacco abuse   . 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 APPENDECTOMY    . CARDIAC CATHETERIZATION N/A 01/23/2015   Procedure: Left Heart Cath and Coronary Angiography;  Surgeon: [name], MD;  Location: [place] [facility];  Service: Cardiovascular;  Laterality: N/A;  . CAROTID ENDARTERECTOMY    . CESAREAN SECTION    . CHOLECYSTECTOMY    . COLONOSCOPY WITH ESOPHAGOGASTRODUODENOSCOPY (EGD)  06/22/2016  . ENTEROSCOPY  01/26/2012   Procedure: ENTEROSCOPY;  Surgeon: Gatha Mayer, MD;  Location: [facility];  Service: Endoscopy;  Laterality: N/A;  . ENTEROSCOPY N/A 03/25/2015   Procedure: ENTEROSCOPY;  Surgeon: Doran Stabler, MD;  Location: [facility] ENDOSCOPY;  Service: Endoscopy;  Laterality: N/A;  . ENTEROSCOPY N/A 06/22/2016   Procedure: ENTEROSCOPY;  Surgeon: Jerene Bears, MD;  Location: [facility] ENDOSCOPY;  Service: Endoscopy;  Laterality: N/A;  . ESOPHAGOGASTRODUODENOSCOPY (EGD) WITH PROPOFOL N/A 06/22/2016   Procedure: ESOPHAGOGASTRODUODENOSCOPY (EGD) WITH PROPOFOL;  Surgeon: Jerene Bears, MD;  Location: [facility] ENDOSCOPY;  Service: Endoscopy;  Laterality: N/A;  . ILIAC ARTERY STENT     left  . SHOULDER ARTHROSCOPY    . TONSILLECTOMY      No Known Allergies   Review of Systems Constitutional: Negative for activity change, appetite change and fatigue.  HENT: Negative for congestion, sinus pressure and sore throat.   Eyes: Negative for visual disturbance.  Respiratory: Negative for cough, chest tightness, shortness of breath and wheezing.   Cardiovascular: Negative for chest pain and palpitations.  Gastrointestinal: Negative for abdominal distention, abdominal pain and constipation.  Endocrine: Negative for polydipsia.  Genitourinary: Negative for dysuria and frequency.  Musculoskeletal: Negative for arthralgias and back pain.  Skin: Negative for rash.  Neurological: Negative for tremors, light-headedness and numbness.  Hematological: Does not bruise/bleed easily.  Psychiatric/Behavioral: Negative for agitation and behavioral problems    Objective: Vitals:   08/12/16 1119  BP: (!) 180/60  Pulse: (!) 52  Temp: 97.8 F (36.6 C)  TempSrc: Oral  SpO2: 99%  Weight: 119 lb 3.2 oz (54.1 kg)        Physical Exam Constitutional: She is oriented to person, place, and time. She appears well-developed and well-nourished.  Cardiovascular: Bradycardic rate, normal heart sounds and intact distal pulses.   No murmur heard. Pulmonary/Chest: Effort normal and breath sounds normal. She has no wheezes. She has no rales. She exhibits no tenderness.  Abdominal: Soft. Bowel sounds are normal. She exhibits no distension and no mass. There is no tenderness.  Musculoskeletal: Normal range of motion. She exhibits no pedal edema  Neurological: She is alert and oriented to person, place, and time.  Skin: Skin is warm and dry.  Psychiatric: She has a normal mood and affect       Lab Results  Component Value Date   HGBA1C 6.1 06/15/2016    CMP Latest Ref Rng & Units 06/29/2016 06/24/2016 06/23/2016  Glucose 65 - 99 mg/dL 120(H) 125(H) 90  BUN 8 - 27 mg/dL 44(H) 65(H) 71(H)  Creatinine 0.57 - 1.00 mg/dL 2.12(H) 3.03(H) 3.11(H)  Sodium 134 - 144 mmol/L 141 136 136  Potassium 3.5 - 5.2 mmol/L 4.8 3.4(L) 2.8(L)  Chloride 96 - 106 mmol/L 105 105 102  CO2 18 - 29 mmol/L 21 22 23   Calcium 8.7 - 10.3 mg/dL 10.7(H) 8.4(L) 7.6(L)  Total Protein 6.0 -  8.5 g/dL 5.4(L) 4.9(L) 4.9(L)  Total Bilirubin 0.0 - 1.2 mg/dL 0.2 0.9 0.9  Alkaline Phos 39 - 117 IU/L 90 83 91  AST 0 - 40 IU/L 15 51(H) 113(H)  ALT 0 - 32 IU/L 34(H) 116(H) 165(H)     Assessment & Plan:  1. Diabetes mellitus due to underlying condition with hyperosmolarity without coma, without long-term current use of insulin (HCC) Diet controlled with A1c of 6.1 - POCT glucose (manual entry)  2. Essential hypertension Uncontrolled Hydralazine added to regimen Continue Coreg, Imdur  Advised to discontinue nifedipine which does not appear on her med list but she has been taking  3. Other emphysema ([place]) Stable, previously on oxygen, no oxygen indicated at this time Continue ICS/LABA  4. Chronic diastolic CHF (congestive heart failure)  (HCC) Worsened with EF of 40-45% which is down from 55-60% previously Increased PA pressure likely due to underlying COPD Keep upcoming appointment with cardiology in 08/2016  5. Stage 4 chronic kidney disease ([place]) Avoid nephrotoxins Keep appointment with nephrology  6. Dementia without behavioral disturbance, unspecified dementia type Continue Aricept  7. Coronary artery disease involving native coronary artery of native heart with angina pectoris (HCC) Plavix on hold due to GI bleed Risk factor modification  8. ANXIETY DEPRESSION Stable  9. Acute esophagitis Continue PPI 2 weeks until 09/05/16 at which time she will be switched to once daily.  10. Lung nodules Stable Repeat CT in 6-12 months - due in 05/2017

## 2016-08-12 NOTE — Patient Instructions (Signed)

## 2016-08-14 ENCOUNTER — Encounter: Payer: Self-pay | Admitting: Emergency Medicine

## 2016-08-14 ENCOUNTER — Emergency Department
Admission: EM | Admit: 2016-08-14 | Discharge: 2016-08-14 | Disposition: A | Discharge status: Home / Self Care | Payer: Medicare Other | Attending: Emergency Medicine | Admitting: Emergency Medicine

## 2016-08-14 ENCOUNTER — Emergency Department: Payer: Medicare Other

## 2016-08-14 DIAGNOSIS — M25562 Pain in left knee: Secondary | ICD-10-CM

## 2016-08-14 DIAGNOSIS — Z9049 Acquired absence of other specified parts of digestive tract: Secondary | ICD-10-CM | POA: Diagnosis not present

## 2016-08-14 DIAGNOSIS — W19XXXA Unspecified fall, initial encounter: Secondary | ICD-10-CM

## 2016-08-14 DIAGNOSIS — M7989 Other specified soft tissue disorders: Secondary | ICD-10-CM | POA: Diagnosis not present

## 2016-08-14 DIAGNOSIS — N184 Chronic kidney disease, stage 4 (severe): Secondary | ICD-10-CM | POA: Diagnosis not present

## 2016-08-14 DIAGNOSIS — Z7902 Long term (current) use of antithrombotics/antiplatelets: Secondary | ICD-10-CM | POA: Insufficient documentation

## 2016-08-14 DIAGNOSIS — Z79899 Other long term (current) drug therapy: Secondary | ICD-10-CM | POA: Diagnosis not present

## 2016-08-14 DIAGNOSIS — Z7982 Long term (current) use of aspirin: Secondary | ICD-10-CM | POA: Diagnosis not present

## 2016-08-14 DIAGNOSIS — S8392XA Sprain of unspecified site of left knee, initial encounter: Secondary | ICD-10-CM | POA: Diagnosis not present

## 2016-08-14 DIAGNOSIS — X509XXA Other and unspecified overexertion or strenuous movements or postures, initial encounter: Secondary | ICD-10-CM | POA: Insufficient documentation

## 2016-08-14 DIAGNOSIS — Y9389 Activity, other specified: Secondary | ICD-10-CM | POA: Diagnosis not present

## 2016-08-14 DIAGNOSIS — Z8673 Personal history of transient ischemic attack (TIA), and cerebral infarction without residual deficits: Secondary | ICD-10-CM | POA: Diagnosis not present

## 2016-08-14 DIAGNOSIS — F17210 Nicotine dependence, cigarettes, uncomplicated: Secondary | ICD-10-CM | POA: Insufficient documentation

## 2016-08-14 DIAGNOSIS — I130 Hypertensive heart and chronic kidney disease with heart failure and stage 1 through stage 4 chronic kidney disease, or unspecified chronic kidney disease: Secondary | ICD-10-CM | POA: Insufficient documentation

## 2016-08-14 DIAGNOSIS — S83402A Sprain of unspecified collateral ligament of left knee, initial encounter: Secondary | ICD-10-CM | POA: Diagnosis not present

## 2016-08-14 DIAGNOSIS — S838X2A Sprain of other specified parts of left knee, initial encounter: Secondary | ICD-10-CM

## 2016-08-14 DIAGNOSIS — I2510 Atherosclerotic heart disease of native coronary artery without angina pectoris: Secondary | ICD-10-CM | POA: Diagnosis not present

## 2016-08-14 DIAGNOSIS — Y998 Other external cause status: Secondary | ICD-10-CM | POA: Diagnosis not present

## 2016-08-14 DIAGNOSIS — I5042 Chronic combined systolic (congestive) and diastolic (congestive) heart failure: Secondary | ICD-10-CM | POA: Insufficient documentation

## 2016-08-14 DIAGNOSIS — E1122 Type 2 diabetes mellitus with diabetic chronic kidney disease: Secondary | ICD-10-CM | POA: Diagnosis not present

## 2016-08-14 DIAGNOSIS — Y929 Unspecified place or not applicable: Secondary | ICD-10-CM | POA: Diagnosis not present

## 2016-08-14 DIAGNOSIS — S80912A Unspecified superficial injury of left knee, initial encounter: Secondary | ICD-10-CM | POA: Diagnosis present

## 2016-08-14 DIAGNOSIS — J449 Chronic obstructive pulmonary disease, unspecified: Secondary | ICD-10-CM | POA: Diagnosis not present

## 2016-08-14 MED ORDER — HYDROCODONE-ACETAMINOPHEN 5-325 MG PO TABS: 1.0000 | ORAL_TABLET | Freq: Four times a day (QID) | ORAL | 0 refills | Status: DC | PRN

## 2016-08-14 MED ORDER — OXYCODONE-ACETAMINOPHEN 5-325 MG PO TABS
1.0000 | ORAL_TABLET | Freq: Once | ORAL | Status: AC
  Administered 2016-08-14: 1 via ORAL
  Filled 2016-08-14: qty 1

## 2016-08-14 NOTE — ED Notes (Signed)
See triage note  States she stepped down wrong on Friday and felt pop to left knee  No deformity but having increased pain with ambulation

## 2016-08-14 NOTE — ED Triage Notes (Signed)
Pt reports on Friday she stepped down off a step at home awkwardly and felt a pop in her left knee.  Pt reports increased pain and swelling since then and pain with weight bearing activity. Pt also states she has been out of BP meds since Friday, last dose was Thursday and was changed meds and is waiting for the medication to come in.

## 2016-08-14 NOTE — ED Provider Notes (Signed)
[facility] Emergency Department Provider Note   ____________________________________________   I have reviewed the triage vital signs and the nursing notes.   HISTORY  Chief Complaint Knee Pain    HPI [name] is a 66 y.o. female presented with left knee pain as result of stepping awkwardly and noting a popping sensation in her left knee. Since the incident she is noted an increased pain and swelling and progressing difficulty with walking and weightbearing. Patient has a history of meniscal damage and a remote surgery to the left knee. Patient also notes that she has run out of her blood pressure medicines and is waiting for those medications to come in the mail. Patient denies fever, chills, headache, vision changes, chest pain, chest tightness, shortness of breath, abdominal pain, nausea and vomiting.  Past Medical History:  Diagnosis Date  . Anemia, chronic disease 05/16/2008   Qualifier: Diagnosis of  By: Percival Spanish, MD, Farrel Gordon    . Bradycardia    a. Limiting BB titration.  [name] CAD (coronary artery disease)    a. NSTEMI 2007 in setting of anemia; LHC 50% LM, 60% LAD, 30% RI, occluded RCA with L-R collaterals, normal EF, treated medically; B. LHC 01/2015 prox-mid RCA 100%, ostial to prox LAD 70%, LM 50%, mid-distal circ 90%-not surgical candidate due to renal and pulmonary issues).  . Carotid artery disease ([place])    a. Duplex 12/2014: left CEA patent with elevated velocities, 60-79% RICA (stable over serial exams), 38-75% LICA stenosis (stable over serial exams), 50% LECA, elevated velocities in bilateral subclavian arteries.  . Chronic combined systolic (congestive) and diastolic (congestive) heart failure ([place])    a. 06/2016 Echo: EF 40-45%, mod MR, mildly dil LA/RA, mod TR, PASP 67mmHg.  . CKD (chronic kidney disease), stage IV ([place])   . COPD (chronic obstructive pulmonary disease) (HCC)    home oxygen  . Dementia   . Depression   . Diabetes  mellitus   . Diabetic retinopathy ([place])   . Essential hypertension   . GI AVM (gastrointestinal arteriovenous vascular malformation)    small bowel  . Hyperlipidemia   . Hypertensive heart disease with congestive heart failure ([place])   . Iron deficiency anemia    a. ? chronic GI blood loss-->plavix d/c'd 06/2016.  [name] Peripheral vascular disease ([place])    a.s /p left common iliac and external iliac artery stents. b. H/o subclavian, mesenteric, and celiac artery stenosis (see below).  . Protein-calorie malnutrition ([place]) 01/24/2015  . Pulmonary hypertension ([place])    a. 06/2016 Echo: PASP 51mmHg.  [name] Renal artery stenosis ([place])    a. Duplex 2014: no obvious evidence of hemodynamically significant stenosis >60%, bilateral intrarenal arteries exhibit absent diastolic flow. There is evidence of elevated velocities of the mid aorta. 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  . Tobacco abuse   . 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 Dieaulafoy's lesion 01/25/2012    Patient Active Problem List   Diagnosis Date Noted  . Lung nodules 06/29/2016  . Acute kidney injury superimposed on chronic kidney disease ([place]) 06/23/2016  . Acute on chronic combined systolic and diastolic CHF (congestive heart failure) ([place]) 06/23/2016  . Hypokalemia 06/23/2016  . Acute esophagitis   . Angiodysplasia of duodenum   . Anemia 06/21/2016  . Cardiac enzymes elevated   . Elevated liver enzymes   . Transaminitis   . Demand ischemia ([place])   . Coffee ground emesis   . Heme positive stool   .  Dementia 03/04/2016  . CAP (community acquired pneumonia) 05/13/2015  . Indwelling catheter present on admission 04/21/2015  . Difficulty breathing   . Pulmonary infiltrates   . Staphylococcus aureus bacteremia   . Palliative care encounter   . Malnutrition of moderate degree 03/30/2015  . HCAP (healthcare-associated pneumonia) 03/29/2015  . Acute respiratory failure ([place]) 03/29/2015  . Hypomagnesemia  03/29/2015  . Low serum vitamin D 03/29/2015  . Metabolic acidosis 41/28/7867  . Coronary artery disease involving native coronary artery of native heart with angina pectoris ([place])   . Sacral ulcer ([place])   . Urinary retention   . Hypocalcemia 03/20/2015  . Lewy body dementia without behavioral disturbance   . Protein-calorie malnutrition ([place]) 01/24/2015  . Abnormal nuclear stress test   . Stage 4 chronic kidney disease ([place])   . Carotid artery disease ([place])   . CAD (coronary artery disease)   . Chronic diastolic CHF (congestive heart failure) ([place])   . Essential hypertension   . Former tobacco use   . Bradycardia   . Nausea & vomiting 01/26/2012  . 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 Diet-controlled diabetes mellitus ([place]) 01/25/2012  . Iron deficiency anemia 01/25/2012  . Renal artery stenosis ([place]) 11/19/2010  . Peripheral vascular disease ([place]) 11/19/2010  . Diabetes ([place]) 05/16/2008  . Hyperlipidemia 05/16/2008  . OBESITY 05/16/2008  . Anemia, chronic disease 05/16/2008  . ANXIETY DEPRESSION 05/16/2008  . History of CVA (cerebrovascular accident) 05/16/2008  . Chronic obstructive pulmonary disease ([place]) 05/16/2008  . DIVERTICULITIS, HX OF 05/16/2008    Past Surgical History:  Procedure Laterality Date  . APPENDECTOMY    . CARDIAC CATHETERIZATION N/A 01/23/2015   Procedure: Left Heart Cath and Coronary Angiography;  Surgeon: [name], MD;  Location: [place] [facility];  Service: Cardiovascular;  Laterality: N/A;  . CAROTID ENDARTERECTOMY    . CESAREAN SECTION    . CHOLECYSTECTOMY    . COLONOSCOPY WITH ESOPHAGOGASTRODUODENOSCOPY (EGD)  06/22/2016  . ENTEROSCOPY  01/26/2012   Procedure: ENTEROSCOPY;  Surgeon: Gatha Mayer, MD;  Location: [facility] ENDOSCOPY;  Service: Endoscopy;  Laterality: N/A;  . ENTEROSCOPY N/A 03/25/2015   Procedure: ENTEROSCOPY;  Surgeon: Doran Stabler, MD;  Location: [facility] ENDOSCOPY;  Service: Endoscopy;   Laterality: N/A;  . ENTEROSCOPY N/A 06/22/2016   Procedure: ENTEROSCOPY;  Surgeon: Jerene Bears, MD;  Location: [facility] ENDOSCOPY;  Service: Endoscopy;  Laterality: N/A;  . ESOPHAGOGASTRODUODENOSCOPY (EGD) WITH PROPOFOL N/A 06/22/2016   Procedure: ESOPHAGOGASTRODUODENOSCOPY (EGD) WITH PROPOFOL;  Surgeon: Jerene Bears, MD;  Location: [facility] ENDOSCOPY;  Service: Endoscopy;  Laterality: N/A;  . ILIAC ARTERY STENT     left  . SHOULDER ARTHROSCOPY    . TONSILLECTOMY      Prior to Admission medications   Medication Sig Start Date End Date Taking? Authorizing Provider  acetaminophen (TYLENOL) 325 MG tablet Take 325 mg by mouth every 6 (six) hours as needed.    [provider]  albuterol (PROVENTIL HFA;VENTOLIN HFA) 108 (90 Base) MCG/ACT inhaler Inhale 2 puffs into the lungs every 6 (six) hours as needed for wheezing or shortness of breath. 06/15/16   Arnoldo Morale, MD  aspirin EC 81 MG tablet Take 81 mg by mouth daily.    [provider]  atorvastatin (LIPITOR) 80 MG tablet TAKE 1 TABLET BY MOUTH  DAILY AT 6 PM. 08/12/16   Arnoldo Morale, MD  Blood Glucose Monitoring Suppl (TRUE METRIX AIR GLUCOSE METER) DEVI 1 Device by  Does not apply route 4 (four) times daily -  before meals and at bedtime. 04/22/15   Arnoldo Morale, MD  calcitRIOL (ROCALTROL) 0.5 MCG capsule Take 1 capsule (0.5 mcg total) by mouth daily. 06/15/16   Arnoldo Morale, MD  calcium carbonate (OS-CAL - DOSED IN MG OF ELEMENTAL CALCIUM) 1250 (500 Ca) MG tablet Take 1 tablet (500 mg of elemental calcium total) by mouth 3 (three) times daily with meals. 04/07/15   Hongalgi, Lenis Dickinson, MD  carvedilol (COREG) 3.125 MG tablet TAKE 1 TABLET BY MOUTH TWO  TIMES DAILY WITH A MEAL 06/15/16   Arnoldo Morale, MD  clopidogrel (PLAVIX) 75 MG tablet Take 1 tablet by mouth daily with breakfast. 07/05/16   [provider]  collagenase (SANTYL) ointment Apply topically daily. Apply to right side of gluteal cleft Unstageable Pressure Injury once daily and  PRN soiling. 04/07/15   Hongalgi, Lenis Dickinson, MD  donepezil (ARICEPT) 10 MG tablet Take 1 tablet (10 mg total) by mouth at bedtime. 06/15/16   Arnoldo Morale, MD  ferrous sulfate 325 (65 FE) MG tablet Take 1 tablet (325 mg total) by mouth 2 (two) times daily with a meal. 06/15/16   Arnoldo Morale, MD  fish oil-omega-3 fatty acids 1000 MG capsule Take 1 g by mouth 3 (three) times daily. Reported on 04/09/2015    [provider]  furosemide (LASIX) 80 MG tablet Take 1 tablet (80 mg total) by mouth 2 (two) times daily. 06/15/16   Arnoldo Morale, MD  glucose blood (TRUE METRIX BLOOD GLUCOSE TEST) test strip Use as instructed 04/22/15   Arnoldo Morale, MD  hydrALAZINE (APRESOLINE) 50 MG tablet Take 1 tablet (50 mg total) by mouth 3 (three) times daily. 08/12/16   Arnoldo Morale, MD  HYDROcodone-acetaminophen (NORCO/VICODIN) 5-325 MG tablet Take 1 tablet by mouth every 6 (six) hours as needed for moderate pain. 08/14/16   Nykole Matos M, PA-C  hydrOXYzine (ATARAX/VISTARIL) 25 MG tablet Take 1 tablet (25 mg total) by mouth 3 (three) times daily as needed. 12/23/15   Arnoldo Morale, MD  isosorbide mononitrate (IMDUR) 60 MG 24 hr tablet Take 1 tablet (60 mg total) by mouth daily. 07/20/16   Arnoldo Morale, MD  Lancet Devices [facility]) lancets Use as instructed daily. 04/21/15   Arnoldo Morale, MD  mometasone-formoterol (DULERA) 100-5 MCG/ACT AERO Inhale 1 puff into the lungs daily. 06/15/16   Arnoldo Morale, MD  nitroGLYCERIN (NITROSTAT) 0.4 MG SL tablet Place 0.4 mg under the tongue every 5 (five) minutes as needed for chest pain. X 3 doses    [provider]  Nutritional Supplements (FEEDING SUPPLEMENT, NEPRO CARB STEADY,) LIQD Take 237 mLs by mouth 2 (two) times daily between meals. 04/07/15   Hongalgi, Lenis Dickinson, MD  pantoprazole (PROTONIX) 40 MG tablet Take 1 tablet (40 mg total) by mouth 2 (two) times daily before a meal. 06/24/16   Sela Hilding, MD  sertraline (ZOLOFT) 50 MG tablet Take 1 tablet  (50 mg total) by mouth daily. 08/12/16   Arnoldo Morale, MD  traMADol (ULTRAM) 50 MG tablet Take 1 tablet (50 mg total) by mouth every 8 (eight) hours as needed. 07/25/16   Virgel Manifold, MD    Allergies Patient has no known allergies.  Family History  Problem Relation Age of Onset  . Hypertension Mother   . Heart attack Mother   . Hypertension Father   . Angina Father   . Cancer Sister   . Cancer Brother   . Stroke Neg Hx  Social History Social History  Substance Use Topics  . Smoking status: Current Every Day Smoker    Packs/day: 1.00    Years: 36.00    Types: Cigarettes  . Smokeless tobacco: Never Used  . Alcohol use No    Review of Systems Constitutional: Negative for fever/chills Eyes: No visual changes. ENT:  Negative for sore throat and for difficulty swallowing Cardiovascular: Denies chest pain. Respiratory: Denies cough Denies shortness of breath. Musculoskeletal: Left knee pain.  Skin: Negative for rash. Neurological: Negative for headaches.  Negative focal weakness or numbness. Negative for loss of consciousness.Difficulty ambulating ____________________________________________   PHYSICAL EXAM:  VITAL SIGNS: ED Triage Vitals  Enc Vitals Group     BP 08/14/16 1103 (!) 207/56     Pulse Rate 08/14/16 1103 (!) 59     Resp 08/14/16 1103 18     Temp 08/14/16 1103 98.4 F (36.9 C)     Temp Source 08/14/16 1103 Oral     SpO2 08/14/16 1103 96 %     Weight 08/14/16 1104 119 lb (54 kg)     Height 08/14/16 1104 4\' 11"  (1.499 m)     Head Circumference --      Peak Flow --      Pain Score 08/14/16 1105 5     Pain Loc --      Pain Edu? --      Excl. in [place]? --     Constitutional: Alert and oriented. Well appearing and in no acute distress.  Head: Normocephalic and atraumatic. Eyes: Conjunctivae are normal. PERRL. Normal extraocular movements. Cardiovascular: Normal rate, regular rhythm. Normal distal pulses. Respiratory: Normal respiratory effort.    Musculoskeletal: Left knee palpation tenderness along anterior knee and medial joint line. Swelling noted along anterior and medial knee. Pain with AROM. Nontender with normal range of motion in all extremities. Negative anterior drawer. Neurologic: Normal speech and language.  Skin:  Skin is warm, dry and intact. No rash noted. Psychiatric: Mood and affect are normal.  ____________________________________________   LABS (all labs ordered are listed, but only abnormal results are displayed)  Labs Reviewed - No data to display ____________________________________________  EKG None ____________________________________________  RADIOLOGY DG left knee complete FINDINGS: Minimal chondrocalcinosis over the medial and lateral compartments. Mild osteopenia. No acute fracture or dislocation. No significant degenerative change. Small joint effusion. Atherosclerotic disease involving the femoral popliteal arteries.  IMPRESSION: No acute fracture or dislocation. Small joint effusion. ____________________________________________   PROCEDURES  Procedure(s) performed: No    Critical Care performed: no ____________________________________________   INITIAL IMPRESSION / ASSESSMENT AND PLAN / ED COURSE  Pertinent labs & imaging results that were available during my care of the patient were reviewed by me and considered in my medical decision making (see chart for details).  Patient presented with left knee pain after stumbling and injuring her knee. Physical exam findings and imaging were reassuring of no acute fracture to the left knee. Symptoms are consistent likely associated with knee sprain. Patient has an extensive medical history and is unable to take NSAIDs or corticosteroids for inflammation. Patient will be given a short course of Vicodin to assist with pain management until she can follow-up with orthopedics for continued care. Patient's patient's left knee will be placed in a  knee immobilizer and she will utilize her walker at home for mobility. Reassessment and vital signs were reassuring at discharge. Patient informed of clinical course, understand medical decision-making process, and agree with plan.  Patient was advised to follow  up with orthopedics and was also advised to return to the emergency department for symptoms that change or worsen.     ____________________________________________   FINAL CLINICAL IMPRESSION(S) / ED DIAGNOSES  Final diagnoses:  Acute pain of left knee  Fall, initial encounter  Sprain of other ligament of left knee, initial encounter       NEW MEDICATIONS STARTED DURING THIS VISIT:  Discharge Medication List as of 08/14/2016 12:17 PM    START taking these medications   Details  HYDROcodone-acetaminophen (NORCO/VICODIN) 5-325 MG tablet Take 1 tablet by mouth every 6 (six) hours as needed for moderate pain., Starting Sun 08/14/2016, Print         Note:  This document was prepared using Dragon voice recognition software and may include unintentional dictation errors.    Jerolyn Shin, PA-C 08/14/16 1639    Schuyler Amor, MD 08/15/16 1116

## 2016-08-21 DIAGNOSIS — J449 Chronic obstructive pulmonary disease, unspecified: Secondary | ICD-10-CM | POA: Diagnosis not present

## 2016-08-21 NOTE — Progress Notes (Signed)
Cardiology Office Note   Date:  08/22/2016   ID:  [name], DOB November 05, 1950, MRN [id number]  PCP:  Arnoldo Morale, MD  Cardiologist:   Minus Breeding, MD   Chief Complaint  Patient presents with  . Coronary Artery Disease      History of Present Illness: [name] is a 66 y.o. female who presents for follow up of CAD.  She has a complex past medical history including severe coronary artery disease which is managed medically as she is not felt to be a surgical candidate due to renal and pulmonary issues. She also has a history of chronic combined CHF with an EF of 40-45% by most recent echo in May 2018, stage IV chronic kidney disease, tobacco abuse, peripheral vascular disease status post left common iliac and external iliac stenting, mesenteric/celiac/subclavian stenoses, renal artery stenosis, carotid arterial disease status post left carotid endarterectomy, sinus bradycardia, diabetes, COPD, hyperlipidemia, hypertension, GI arteriovenous malformation status post prior cauterization, iron deficiency anemia, CVA in 2010, Louis body dementia, pulmonary nodules with mediastinal and hilar adenopathy, and pulmonary hypertension.  She was admitted in early May to [facility] secondary to nausea, vomiting, abdominal pain, and profound anemia with a hemoglobin of 5.2. In that setting, she did have worsening of renal function, elevated LFTs, hypoalbuminemia, hypokalemia, and protein calorie malnutrition. EGD on May 2 showed acute/erosive gastritis with nonbleeding angiectasia and this was treated. She did require transfusions. In the setting of all of this, troponins were elevated with the peak of 0.49. Echo showed an EF of 40-45% with elevated LVEDP, moderate MR, and severe pulmonary hypertension with a PAS P of 80 mmHg. Given known prior severe coronary disease, conservative therapy was recommended. She was not felt to be a candidate for Plavix due to GI bleeding. Aspirin was resumed.  She  was seen in follow up after this.  The patient denies any new symptoms such as chest discomfort, neck or arm discomfort. There has been no new shortness of breath, PND or orthopnea. There have been no reported palpitations, presyncope or syncope.  She gets around with a cane and denies any complaints.  I did review the last note from her Arnoldo Morale, MD.  He added hydralazine recently.     Past Medical History:  Diagnosis Date  . Anemia, chronic disease 05/16/2008   Qualifier: Diagnosis of  By: Percival Spanish, MD, Farrel Gordon    . Bradycardia    a. Limiting BB titration.  [name] CAD (coronary artery disease)    a. NSTEMI 2007 in setting of anemia; LHC 50% LM, 60% LAD, 30% RI, occluded RCA with L-R collaterals, normal EF, treated medically; B. LHC 01/2015 prox-mid RCA 100%, ostial to prox LAD 70%, LM 50%, mid-distal circ 90%-not surgical candidate due to renal and pulmonary issues).  . Carotid artery disease ([place])    a. Duplex 12/2014: left CEA patent with elevated velocities, 60-79% RICA (stable over serial exams), 19-37% LICA stenosis (stable over serial exams), 50% LECA, elevated velocities in bilateral subclavian arteries.  . Chronic combined systolic (congestive) and diastolic (congestive) heart failure ([place])    a. 06/2016 Echo: EF 40-45%, mod MR, mildly dil LA/RA, mod TR, PASP 54mmHg.  . CKD (chronic kidney disease), stage IV ([place])   . COPD (chronic obstructive pulmonary disease) ([facility])    home oxygen  . Dementia   . Depression   . Diabetes mellitus   . Diabetic retinopathy ([place])   . Essential hypertension   . GI AVM (  gastrointestinal arteriovenous vascular malformation)    small bowel  . Hyperlipidemia   . Hypertensive heart disease with congestive heart failure ([place])   . Iron deficiency anemia    a. ? chronic GI blood loss-->plavix d/c'd 06/2016.  [name] Peripheral vascular disease ([place])    a.s /p left common iliac and external iliac artery stents. b. H/o subclavian, mesenteric, and celiac artery  stenosis (see below).  . Protein-calorie malnutrition ([place]) 01/24/2015  . Pulmonary hypertension ([place])    a. 06/2016 Echo: PASP 12mmHg.  [name] Renal artery stenosis ([place])    a. Duplex 2014: no obvious evidence of hemodynamically significant stenosis >60%, bilateral intrarenal arteries exhibit absent diastolic flow. There is evidence of elevated velocities of the mid aorta. 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  . Tobacco abuse   . 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 APPENDECTOMY    . CARDIAC CATHETERIZATION N/A 01/23/2015   Procedure: Left Heart Cath and Coronary Angiography;  Surgeon: [name], MD;  Location: [facility];  Service: Cardiovascular;  Laterality: N/A;  . CAROTID ENDARTERECTOMY    . CESAREAN SECTION    . CHOLECYSTECTOMY    . COLONOSCOPY WITH ESOPHAGOGASTRODUODENOSCOPY (EGD)  06/22/2016  . ENTEROSCOPY  01/26/2012   Procedure: ENTEROSCOPY;  Surgeon: Gatha Mayer, MD;  Location: [facility];  Service: Endoscopy;  Laterality: N/A;  . ENTEROSCOPY N/A 03/25/2015   Procedure: ENTEROSCOPY;  Surgeon: Doran Stabler, MD;  Location: [facility];  Service: Endoscopy;  Laterality: N/A;  . ENTEROSCOPY N/A 06/22/2016   Procedure: ENTEROSCOPY;  Surgeon: Jerene Bears, MD;  Location: [facility] ENDOSCOPY;  Service: Endoscopy;  Laterality: N/A;  . ESOPHAGOGASTRODUODENOSCOPY (EGD) WITH PROPOFOL N/A 06/22/2016   Procedure: ESOPHAGOGASTRODUODENOSCOPY (EGD) WITH PROPOFOL;  Surgeon: Jerene Bears, MD;  Location: [facility] ENDOSCOPY;  Service: Endoscopy;  Laterality: N/A;  . ILIAC ARTERY STENT     left  . SHOULDER ARTHROSCOPY    . TONSILLECTOMY       Current Outpatient Prescriptions  Medication Sig Dispense Refill  . acetaminophen (TYLENOL) 325 MG tablet Take 325 mg by mouth every 6 (six) hours as needed.    [name] albuterol (PROVENTIL HFA;VENTOLIN HFA) 108 (90 Base) MCG/ACT inhaler  Inhale 2 puffs into the lungs every 6 (six) hours as needed for wheezing or shortness of breath. 1 Inhaler 3  . aspirin EC 81 MG tablet Take 81 mg by mouth daily.    [name] atorvastatin (LIPITOR) 80 MG tablet TAKE 1 TABLET BY MOUTH  DAILY AT 6 PM. 90 tablet 1  . Blood Glucose Monitoring Suppl (TRUE METRIX AIR GLUCOSE METER) DEVI 1 Device by Does not apply route 4 (four) times daily -  before meals and at bedtime. 1 Device 0  . calcitRIOL (ROCALTROL) 0.5 MCG capsule Take 1 capsule (0.5 mcg total) by mouth daily. 90 capsule 1  . calcium carbonate (OS-CAL - DOSED IN MG OF ELEMENTAL CALCIUM) 1250 (500 Ca) MG tablet Take 1 tablet (500 mg of elemental calcium total) by mouth 3 (three) times daily with meals. 90 tablet 0  . carvedilol (COREG) 3.125 MG tablet TAKE 1 TABLET BY MOUTH TWO  TIMES DAILY WITH A MEAL 180 tablet 1  . clopidogrel (PLAVIX) 75 MG tablet Take 1 tablet by mouth daily with breakfast.    . collagenase (SANTYL) ointment Apply topically daily. Apply to right side of gluteal cleft Unstageable Pressure Injury  once daily and PRN soiling. 15 g 0  . donepezil (ARICEPT) 10 MG tablet Take 1 tablet (10 mg total) by mouth at bedtime. 90 tablet 1  . ferrous sulfate 325 (65 FE) MG tablet Take 1 tablet (325 mg total) by mouth 2 (two) times daily with a meal. 180 tablet 1  . fish oil-omega-3 fatty acids 1000 MG capsule Take 1 g by mouth 3 (three) times daily. Reported on 04/09/2015    . furosemide (LASIX) 80 MG tablet Take 1 tablet (80 mg total) by mouth 2 (two) times daily. 180 tablet 1  . glucose blood (TRUE METRIX BLOOD GLUCOSE TEST) test strip Use as instructed 100 each 12  . hydrALAZINE (APRESOLINE) 50 MG tablet Take 1 tablet (50 mg total) by mouth 3 (three) times daily. 90 tablet 3  . HYDROcodone-acetaminophen (NORCO/VICODIN) 5-325 MG tablet Take 1 tablet by mouth every 6 (six) hours as needed for moderate pain. 20 tablet 0  . hydrOXYzine (ATARAX/VISTARIL) 25 MG tablet Take 1 tablet (25 mg total) by  mouth 3 (three) times daily as needed. 60 tablet 2  . Lancet Devices (ACCU-CHEK SOFTCLIX) lancets Use as instructed daily. 1 each 5  . mometasone-formoterol (DULERA) 100-5 MCG/ACT AERO Inhale 1 puff into the lungs daily. 1 Inhaler 3  . nitroGLYCERIN (NITROSTAT) 0.4 MG SL tablet Place 0.4 mg under the tongue every 5 (five) minutes as needed for chest pain. X 3 doses    . Nutritional Supplements (FEEDING SUPPLEMENT, NEPRO CARB STEADY,) LIQD Take 237 mLs by mouth 2 (two) times daily between meals.    . pantoprazole (PROTONIX) 40 MG tablet Take 1 tablet (40 mg total) by mouth 2 (two) times daily before a meal. 60 tablet 2  . sertraline (ZOLOFT) 50 MG tablet Take 1 tablet (50 mg total) by mouth daily. 90 tablet 1  . traMADol (ULTRAM) 50 MG tablet Take 1 tablet (50 mg total) by mouth every 8 (eight) hours as needed. 9 tablet 0  . isosorbide mononitrate (IMDUR) 120 MG 24 hr tablet Take 1 tablet (120 mg total) by mouth daily. 90 tablet 3   No current facility-administered medications for this visit.     Allergies:   Patient has no known allergies.     ROS:  Please see the history of present illness.   Otherwise, review of systems are positive for none.   All other systems are reviewed and negative.    PHYSICAL EXAM: VS:  BP (!) 162/52 (BP Location: Left Arm, Patient Position: Sitting, Cuff Size: Normal)   Pulse 62   Ht 4\' 11"  (1.499 m)   Wt 120 lb 9.6 oz (54.7 kg)   BMI 24.36 kg/m  , BMI Body mass index is 24.36 kg/m. GENERAL:  Well appearing HEENT:  Pupils equal round and reactive, fundi not visualized, oral mucosa unremarkable, poor dentition NECK:  No jugular venous distention, waveform within normal limits, carotid upstroke brisk and symmetric, left carotid bruit, no thyromegaly LYMPHATICS:  No cervical, inguinal adenopathy LUNGS:  Clear to auscultation bilaterally BACK:  No CVA tenderness CHEST:  Unremarkable HEART:  PMI not displaced or sustained,S1 and S2 within normal limits, no  S3, no S4, no clicks, no rubs, soft apical systolic murmur radiating out the outflow tract, no diastolic murmurs ABD:  Flat, positive bowel sounds normal in frequency in pitch, no bruits, no rebound, no guarding, no midline pulsatile mass, no hepatomegaly, no splenomegaly EXT:  2 plus pulses upper, decreased DP/PT bilateral lower, no edema, no cyanosis no clubbing  SKIN:  No rashes no nodules, rash under the breasts.   NEURO:  Cranial nerves II through XII grossly intact, motor grossly intact throughout PSYCH:  Cognitively intact, oriented to person place and time    EKG:  EKG is ordered today.   Recent Labs: 06/21/2016: B Natriuretic Peptide >4,500.0 06/22/2016: TSH 2.763 06/23/2016: Magnesium 1.7 06/29/2016: ALT 34; BUN 44; Creatinine, Ser 2.12; Potassium 4.8; Sodium 141 07/14/2016: Hemoglobin 11.8; Platelets 229    Lipid Panel    Component Value Date/Time   CHOL 271 (H) 12/24/2015 0922   TRIG 333 (H) 12/24/2015 0922   HDL 32 (L) 12/24/2015 0922   CHOLHDL 8.5 (H) 12/24/2015 0922   VLDL 67 (H) 12/24/2015 0922   LDLCALC 172 (H) 12/24/2015 0922   LDLDIRECT 133.6 05/17/2012 0846      Wt Readings from Last 3 Encounters:  08/22/16 120 lb 9.6 oz (54.7 kg)  08/14/16 119 lb (54 kg)  08/12/16 119 lb 3.2 oz (54.1 kg)      Other studies Reviewed: Additional studies/ records that were reviewed today include: Primary care office records, labs Review of the above records demonstrates:  Please see elsewhere in the note.     ASSESSMENT AND PLAN:   Coronary artery disease:   The patient has had no new symptoms.  We are going to continue to manage her medically.  I will change her meds as below.   Chronic combined systolic and diastolic congestive heart failure:  She seems to be euvolemic.  I will change meds as below.  She will continue with daily weights.   Chronic anemia with chronic GI blood loss:    She was not anemic on labs in May.  This will be followed by Arnoldo Morale, MD    Stage IV chronic kidney disease:     She will follow with renal.   Creatinine recently more stable.  Severe pulmonary hypertension:   She currently is not having symptoms.  No change in therapy is planned.   Multifactorial setting of known pulmonary and cardiovascular disease.   Essential hypertension:   BP is still not controlled.  I will increase the Imdur to 120 mg daily.  Hyperlipidemia:    She needs to come back for a fasting lipid profile when she has her carotid.  Carotid Arterial dzs:  S/p L CEA.   Doppler was ordered but not yet done.  I will arrange this.      Current medicines are reviewed at length with the patient today.  The patient does not have concerns regarding medicines.  The following changes have been made:  As above  Labs/ tests ordered today include:    Orders Placed This Encounter  Procedures  . Lipid panel     Disposition:   FU with APP in  3 months.     Signed, Minus Breeding, MD  08/22/2016 12:39 PM    [facility]

## 2016-08-22 ENCOUNTER — Encounter: Payer: Self-pay | Admitting: Cardiology

## 2016-08-22 ENCOUNTER — Ambulatory Visit (INDEPENDENT_AMBULATORY_CARE_PROVIDER_SITE_OTHER): Payer: Medicare Other | Admitting: Cardiology

## 2016-08-22 VITALS — BP 162/52 | HR 62 | Ht 59.0 in | Wt 120.6 lb

## 2016-08-22 DIAGNOSIS — E785 Hyperlipidemia, unspecified: Secondary | ICD-10-CM | POA: Diagnosis not present

## 2016-08-22 DIAGNOSIS — N184 Chronic kidney disease, stage 4 (severe): Secondary | ICD-10-CM | POA: Diagnosis not present

## 2016-08-22 DIAGNOSIS — I739 Peripheral vascular disease, unspecified: Secondary | ICD-10-CM

## 2016-08-22 DIAGNOSIS — I10 Essential (primary) hypertension: Secondary | ICD-10-CM | POA: Diagnosis not present

## 2016-08-22 DIAGNOSIS — I779 Disorder of arteries and arterioles, unspecified: Secondary | ICD-10-CM

## 2016-08-22 DIAGNOSIS — I2510 Atherosclerotic heart disease of native coronary artery without angina pectoris: Secondary | ICD-10-CM | POA: Diagnosis not present

## 2016-08-22 DIAGNOSIS — I5042 Chronic combined systolic (congestive) and diastolic (congestive) heart failure: Secondary | ICD-10-CM | POA: Diagnosis not present

## 2016-08-22 DIAGNOSIS — I6523 Occlusion and stenosis of bilateral carotid arteries: Secondary | ICD-10-CM | POA: Diagnosis not present

## 2016-08-22 MED ORDER — ISOSORBIDE MONONITRATE ER 120 MG PO TB24: 120.0000 mg | ORAL_TABLET | Freq: Every day | ORAL | 3 refills | Status: DC

## 2016-08-22 NOTE — Patient Instructions (Addendum)
Your physician has recommended you make the following change in your medication:   1.) the isosorbide has been increased from 60 mg 120 mg. A new prescription has been sent to your pharmacy to reflect this change.  Your physician recommends that you schedule a follow-up appointment in: 3 months with a APP.  Your physician has requested that you have a carotid duplex. This test is an ultrasound of the carotid arteries in your neck. It looks at blood flow through these arteries that supply the brain with blood. Allow one hour for this exam. There are no restrictions or special instructions.  Your physician recommends that you return for a FASTING lipid profile WHEN YOU RETURN FOR YOUR CAROTID DOPPLER.

## 2016-09-07 ENCOUNTER — Ambulatory Visit (HOSPITAL_COMMUNITY)
Admission: RE | Admit: 2016-09-07 | Discharge: 2016-09-07 | Disposition: A | Discharge status: Home / Self Care | Payer: Medicare Other | Source: Ambulatory Visit | Attending: Cardiology | Admitting: Cardiology

## 2016-09-07 DIAGNOSIS — R42 Dizziness and giddiness: Secondary | ICD-10-CM | POA: Insufficient documentation

## 2016-09-07 DIAGNOSIS — H539 Unspecified visual disturbance: Secondary | ICD-10-CM | POA: Insufficient documentation

## 2016-09-07 DIAGNOSIS — I779 Disorder of arteries and arterioles, unspecified: Secondary | ICD-10-CM

## 2016-09-07 DIAGNOSIS — E785 Hyperlipidemia, unspecified: Secondary | ICD-10-CM | POA: Diagnosis not present

## 2016-09-07 DIAGNOSIS — I6523 Occlusion and stenosis of bilateral carotid arteries: Secondary | ICD-10-CM | POA: Diagnosis not present

## 2016-09-07 DIAGNOSIS — Z87891 Personal history of nicotine dependence: Secondary | ICD-10-CM | POA: Diagnosis not present

## 2016-09-07 DIAGNOSIS — J449 Chronic obstructive pulmonary disease, unspecified: Secondary | ICD-10-CM | POA: Insufficient documentation

## 2016-09-07 DIAGNOSIS — E1151 Type 2 diabetes mellitus with diabetic peripheral angiopathy without gangrene: Secondary | ICD-10-CM | POA: Diagnosis not present

## 2016-09-07 DIAGNOSIS — I10 Essential (primary) hypertension: Secondary | ICD-10-CM | POA: Diagnosis not present

## 2016-09-07 DIAGNOSIS — I2510 Atherosclerotic heart disease of native coronary artery without angina pectoris: Secondary | ICD-10-CM | POA: Insufficient documentation

## 2016-09-07 DIAGNOSIS — R51 Headache: Secondary | ICD-10-CM | POA: Insufficient documentation

## 2016-09-07 DIAGNOSIS — I739 Peripheral vascular disease, unspecified: Secondary | ICD-10-CM

## 2016-09-07 DIAGNOSIS — Z8673 Personal history of transient ischemic attack (TIA), and cerebral infarction without residual deficits: Secondary | ICD-10-CM | POA: Insufficient documentation

## 2016-09-13 ENCOUNTER — Ambulatory Visit: Payer: Medicare Other | Attending: Family Medicine | Admitting: Family Medicine

## 2016-09-13 ENCOUNTER — Encounter: Payer: Self-pay | Admitting: Family Medicine

## 2016-09-13 VITALS — BP 150/82 | HR 68 | Temp 97.6°F | Resp 18 | Ht 59.0 in | Wt 119.0 lb

## 2016-09-13 DIAGNOSIS — F0390 Unspecified dementia without behavioral disturbance: Secondary | ICD-10-CM | POA: Diagnosis not present

## 2016-09-13 DIAGNOSIS — K551 Chronic vascular disorders of intestine: Secondary | ICD-10-CM | POA: Insufficient documentation

## 2016-09-13 DIAGNOSIS — Z7982 Long term (current) use of aspirin: Secondary | ICD-10-CM | POA: Diagnosis not present

## 2016-09-13 DIAGNOSIS — K209 Esophagitis, unspecified without bleeding: Secondary | ICD-10-CM

## 2016-09-13 DIAGNOSIS — D638 Anemia in other chronic diseases classified elsewhere: Secondary | ICD-10-CM | POA: Diagnosis not present

## 2016-09-13 DIAGNOSIS — E0800 Diabetes mellitus due to underlying condition with hyperosmolarity without nonketotic hyperglycemic-hyperosmolar coma (NKHHC): Secondary | ICD-10-CM | POA: Diagnosis not present

## 2016-09-13 DIAGNOSIS — I130 Hypertensive heart and chronic kidney disease with heart failure and stage 1 through stage 4 chronic kidney disease, or unspecified chronic kidney disease: Secondary | ICD-10-CM | POA: Insufficient documentation

## 2016-09-13 DIAGNOSIS — E1122 Type 2 diabetes mellitus with diabetic chronic kidney disease: Secondary | ICD-10-CM | POA: Insufficient documentation

## 2016-09-13 DIAGNOSIS — I701 Atherosclerosis of renal artery: Secondary | ICD-10-CM | POA: Insufficient documentation

## 2016-09-13 DIAGNOSIS — I252 Old myocardial infarction: Secondary | ICD-10-CM | POA: Diagnosis not present

## 2016-09-13 DIAGNOSIS — Z720 Tobacco use: Secondary | ICD-10-CM | POA: Insufficient documentation

## 2016-09-13 DIAGNOSIS — I5042 Chronic combined systolic (congestive) and diastolic (congestive) heart failure: Secondary | ICD-10-CM | POA: Diagnosis not present

## 2016-09-13 DIAGNOSIS — I10 Essential (primary) hypertension: Secondary | ICD-10-CM | POA: Diagnosis not present

## 2016-09-13 DIAGNOSIS — N184 Chronic kidney disease, stage 4 (severe): Secondary | ICD-10-CM | POA: Diagnosis not present

## 2016-09-13 DIAGNOSIS — F329 Major depressive disorder, single episode, unspecified: Secondary | ICD-10-CM | POA: Diagnosis not present

## 2016-09-13 DIAGNOSIS — E785 Hyperlipidemia, unspecified: Secondary | ICD-10-CM | POA: Diagnosis not present

## 2016-09-13 DIAGNOSIS — Z9889 Other specified postprocedural states: Secondary | ICD-10-CM | POA: Insufficient documentation

## 2016-09-13 DIAGNOSIS — J449 Chronic obstructive pulmonary disease, unspecified: Secondary | ICD-10-CM | POA: Insufficient documentation

## 2016-09-13 DIAGNOSIS — I272 Pulmonary hypertension, unspecified: Secondary | ICD-10-CM | POA: Diagnosis not present

## 2016-09-13 DIAGNOSIS — I2510 Atherosclerotic heart disease of native coronary artery without angina pectoris: Secondary | ICD-10-CM | POA: Diagnosis not present

## 2016-09-13 DIAGNOSIS — E1151 Type 2 diabetes mellitus with diabetic peripheral angiopathy without gangrene: Secondary | ICD-10-CM | POA: Insufficient documentation

## 2016-09-13 DIAGNOSIS — Z9049 Acquired absence of other specified parts of digestive tract: Secondary | ICD-10-CM | POA: Insufficient documentation

## 2016-09-13 DIAGNOSIS — Z79899 Other long term (current) drug therapy: Secondary | ICD-10-CM | POA: Insufficient documentation

## 2016-09-13 DIAGNOSIS — I5032 Chronic diastolic (congestive) heart failure: Secondary | ICD-10-CM | POA: Diagnosis not present

## 2016-09-13 LAB — GLUCOSE, POCT (MANUAL RESULT ENTRY): POC GLUCOSE: 100 mg/dL — AB (ref 70–99)

## 2016-09-13 LAB — POCT GLYCOSYLATED HEMOGLOBIN (HGB A1C): HEMOGLOBIN A1C: 5.6

## 2016-09-13 MED ORDER — CARVEDILOL 6.25 MG PO TABS: ORAL_TABLET | ORAL | 1 refills | Status: AC

## 2016-09-13 MED ORDER — PANTOPRAZOLE SODIUM 40 MG PO TBEC: 40.0000 mg | DELAYED_RELEASE_TABLET | Freq: Every day | ORAL | 2 refills | Status: DC

## 2016-09-13 NOTE — Progress Notes (Signed)
Subjective:  Patient ID: [name], female    DOB: 1950/11/28  Age: 66 y.o. MRN: [id number]  CC: Hypertension   HPI [name] is a 85 -year-old female with a history of diet-controlled type 2 diabetes mellitus (A1c 6.1), chronic kidney disease stage IV, hypertension, COPD, diastolic CHF (EF 74-12% down from 55-60% Previously),CAD,  GI AV malformation status post cauterization, anemia who comes into the clinic for a follow-up of hypertension.  Her blood pressure is  elevated and I had added hydralazine at her last office visit; Cardiology had increased her isosorbide to 120 mg daily recently. She endorses compliance and her pillboxes being filled by her daughter every week.   2-D echo performed revealed EF of 40-45%, apical hypokinesis, mildly to moderately reduced systolic function, left and right atrium dilatation, elevated PA pressure of 80 mmHg  2-D echo from 03/2015- EF of 87-86%, normal systolic function, normal wall motion, no regional wall motion abnormalities, with 2 diastolic dysfunction, mildly dilated left atrium, moderately increased PA pressure of 62 mmHg  She is accompanied by her husband for today's visit and has no concerns today.   Past Medical History:  Diagnosis Date  . Anemia, chronic disease 05/16/2008   Qualifier: Diagnosis of  By: Percival Spanish, MD, Farrel Gordon    . Bradycardia    a. Limiting BB titration.  [name] CAD (coronary artery disease)    a. NSTEMI 2007 in setting of anemia; LHC 50% LM, 60% LAD, 30% RI, occluded RCA with L-R collaterals, normal EF, treated medically; B. LHC 01/2015 prox-mid RCA 100%, ostial to prox LAD 70%, LM 50%, mid-distal circ 90%-not surgical candidate due to renal and pulmonary issues).  . Carotid artery disease ([place])    a. Duplex 12/2014: left CEA patent with elevated velocities, 60-79% RICA (stable over serial exams), 76-72% LICA stenosis (stable over serial exams), 50% LECA, elevated velocities in bilateral subclavian arteries.  .  Chronic combined systolic (congestive) and diastolic (congestive) heart failure ([place])    a. 06/2016 Echo: EF 40-45%, mod MR, mildly dil LA/RA, mod TR, PASP 31mmHg.  . CKD (chronic kidney disease), stage IV ([place])   . COPD (chronic obstructive pulmonary disease) (HCC)    home oxygen  . Dementia   . Depression   . Diabetes mellitus   . Diabetic retinopathy ([place])   . Essential hypertension   . GI AVM (gastrointestinal arteriovenous vascular malformation)    small bowel  . Hyperlipidemia   . Hypertensive heart disease with congestive heart failure ([place])   . Iron deficiency anemia    a. ? chronic GI blood loss-->plavix d/c'd 06/2016.  [name] Peripheral vascular disease ([place])    a.s /p left common iliac and external iliac artery stents. b. H/o subclavian, mesenteric, and celiac artery stenosis (see below).  . Protein-calorie malnutrition ([place]) 01/24/2015  . Pulmonary hypertension ([place])    a. 06/2016 Echo: PASP 52mmHg.  [name] Renal artery stenosis ([place])    a. Duplex 2014: no obvious evidence of hemodynamically significant stenosis >60%, bilateral intrarenal arteries exhibit absent diastolic flow. There is evidence of elevated velocities of the mid aorta. 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  . Tobacco abuse   . 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 APPENDECTOMY    . CARDIAC CATHETERIZATION N/A 01/23/2015   Procedure: Left Heart Cath and Coronary Angiography;  Surgeon: [name], MD;  Location: [facility];  Service: Cardiovascular;  Laterality: N/A;  . CAROTID ENDARTERECTOMY    . CESAREAN SECTION    . CHOLECYSTECTOMY    . COLONOSCOPY WITH ESOPHAGOGASTRODUODENOSCOPY (EGD)  06/22/2016  . ENTEROSCOPY  01/26/2012   Procedure: ENTEROSCOPY;  Surgeon: Gatha Mayer, MD;  Location: [facility]COPY;  Service: Endoscopy;  Laterality: N/A;  . ENTEROSCOPY N/A 03/25/2015   Procedure:  ENTEROSCOPY;  Surgeon: Doran Stabler, MD;  Location: [facility] ENDOSCOPY;  Service: Endoscopy;  Laterality: N/A;  . ENTEROSCOPY N/A 06/22/2016   Procedure: ENTEROSCOPY;  Surgeon: Jerene Bears, MD;  Location: [facility] ENDOSCOPY;  Service: Endoscopy;  Laterality: N/A;  . ESOPHAGOGASTRODUODENOSCOPY (EGD) WITH PROPOFOL N/A 06/22/2016   Procedure: ESOPHAGOGASTRODUODENOSCOPY (EGD) WITH PROPOFOL;  Surgeon: Jerene Bears, MD;  Location: [facility] ENDOSCOPY;  Service: Endoscopy;  Laterality: N/A;  . ILIAC ARTERY STENT     left  . SHOULDER ARTHROSCOPY    . TONSILLECTOMY      No Known Allergies   Outpatient Medications Prior to Visit  Medication Sig Dispense Refill  . acetaminophen (TYLENOL) 325 MG tablet Take 325 mg by mouth every 6 (six) hours as needed.    [name] albuterol (PROVENTIL HFA;VENTOLIN HFA) 108 (90 Base) MCG/ACT inhaler Inhale 2 puffs into the lungs every 6 (six) hours as needed for wheezing or shortness of breath. 1 Inhaler 3  . aspirin EC 81 MG tablet Take 81 mg by mouth daily.    [name] atorvastatin (LIPITOR) 80 MG tablet TAKE 1 TABLET BY MOUTH  DAILY AT 6 PM. 90 tablet 1  . Blood Glucose Monitoring Suppl (TRUE METRIX AIR GLUCOSE METER) DEVI 1 Device by Does not apply route 4 (four) times daily -  before meals and at bedtime. 1 Device 0  . calcitRIOL (ROCALTROL) 0.5 MCG capsule Take 1 capsule (0.5 mcg total) by mouth daily. 90 capsule 1  . calcium carbonate (OS-CAL - DOSED IN MG OF ELEMENTAL CALCIUM) 1250 (500 Ca) MG tablet Take 1 tablet (500 mg of elemental calcium total) by mouth 3 (three) times daily with meals. 90 tablet 0  . clopidogrel (PLAVIX) 75 MG tablet Take 1 tablet by mouth daily with breakfast.    . collagenase (SANTYL) ointment Apply topically daily. Apply to right side of gluteal cleft Unstageable Pressure Injury once daily and PRN soiling. 15 g 0  . donepezil (ARICEPT) 10 MG tablet Take 1 tablet (10 mg total) by mouth at bedtime. 90 tablet 1  . ferrous sulfate 325 (65 FE) MG tablet Take 1 tablet  (325 mg total) by mouth 2 (two) times daily with a meal. 180 tablet 1  . fish oil-omega-3 fatty acids 1000 MG capsule Take 1 g by mouth 3 (three) times daily. Reported on 04/09/2015    . furosemide (LASIX) 80 MG tablet Take 1 tablet (80 mg total) by mouth 2 (two) times daily. 180 tablet 1  . glucose blood (TRUE METRIX BLOOD GLUCOSE TEST) test strip Use as instructed 100 each 12  . hydrALAZINE (APRESOLINE) 50 MG tablet Take 1 tablet (50 mg total) by mouth 3 (three) times daily. 90 tablet 3  . HYDROcodone-acetaminophen (NORCO/VICODIN) 5-325 MG tablet Take 1 tablet by mouth every 6 (six) hours as needed for moderate pain. 20 tablet 0  . hydrOXYzine (ATARAX/VISTARIL) 25 MG tablet Take 1 tablet (25 mg total) by mouth 3 (three) times daily as needed. 60 tablet 2  . isosorbide mononitrate (IMDUR) 120 MG 24 hr tablet Take 1 tablet (120 mg total) by mouth daily. 90 tablet  3  . Lancet Devices (ACCU-CHEK SOFTCLIX) lancets Use as instructed daily. 1 each 5  . mometasone-formoterol (DULERA) 100-5 MCG/ACT AERO Inhale 1 puff into the lungs daily. 1 Inhaler 3  . nitroGLYCERIN (NITROSTAT) 0.4 MG SL tablet Place 0.4 mg under the tongue every 5 (five) minutes as needed for chest pain. X 3 doses    . Nutritional Supplements (FEEDING SUPPLEMENT, NEPRO CARB STEADY,) LIQD Take 237 mLs by mouth 2 (two) times daily between meals.    . sertraline (ZOLOFT) 50 MG tablet Take 1 tablet (50 mg total) by mouth daily. 90 tablet 1  . traMADol (ULTRAM) 50 MG tablet Take 1 tablet (50 mg total) by mouth every 8 (eight) hours as needed. 9 tablet 0  . carvedilol (COREG) 3.125 MG tablet TAKE 1 TABLET BY MOUTH TWO  TIMES DAILY WITH A MEAL 180 tablet 1  . pantoprazole (PROTONIX) 40 MG tablet Take 1 tablet (40 mg total) by mouth 2 (two) times daily before a meal. 60 tablet 2   No facility-administered medications prior to visit.     ROS Review of Systems Constitutional: Negative for activity change, appetite change and fatigue.  HENT:  Negative for congestion, sinus pressure and sore throat.   Eyes: Negative for visual disturbance.  Respiratory: Negative for cough, chest tightness, shortness of breath and wheezing.   Cardiovascular: Negative for chest pain and palpitations.  Gastrointestinal: Negative for abdominal distention, abdominal pain and constipation.  Endocrine: Negative for polydipsia.  Genitourinary: Negative for dysuria and frequency.  Musculoskeletal: Negative for arthralgias and back pain.  Skin: Negative for rash.  Neurological: Negative for tremors, light-headedness and numbness.  Hematological: Does not bruise/bleed easily.  Psychiatric/Behavioral: Negative for agitation and behavioral problems    Objective:  BP (!) 161/64 (BP Location: Right Arm, Patient Position: Sitting, Cuff Size: Normal)   Pulse 68   Temp 97.6 F (36.4 C) (Oral)   Resp 18   Ht 4\' 11"  (1.499 m)   Wt 119 lb (54 kg)   SpO2 99%   BMI 24.04 kg/m   BP/Weight 09/13/2016 08/22/2016 10/09/2991  Systolic BP 716 967 893  Diastolic BP 64 52 75  Wt. (Lbs) 119 120.6 119  BMI 24.04 24.36 24.04      Physical Exam Constitutional: She is oriented to person, place, and time. She appears well-developed and well-nourished.  Cardiovascular: Bradycardic rate, normal heart sounds and intact distal pulses.   No murmur heard. Pulmonary/Chest: Effort normal and breath sounds normal. She has no wheezes. She has no rales. She exhibits no tenderness.  Abdominal: Soft. Bowel sounds are normal. She exhibits no distension and no mass. There is no tenderness.  Musculoskeletal: Normal range of motion. She exhibits no pedal edema  Neurological: She is alert and oriented to person, place, and time.  Skin: Skin is warm and dry.  Psychiatric: She has a normal mood and affect  Assessment & Plan:   1. Essential hypertension Uncontrolled Increased dose of carvedilol Low-sodium diet - Lipid panel; Future - carvedilol (COREG) 6.25 MG tablet; TAKE 1 TABLET  BY MOUTH TWO  TIMES DAILY WITH A MEAL  Dispense: 180 tablet; Refill: 1  2. Chronic diastolic CHF (congestive heart failure) (HCC) EF of 40-45% from 06/2016 Euvolemic Daily weights, low sodium, cardiac diet Continue medications  3. Coronary artery disease involving native coronary artery of native heart without angina pectoris S/p L carotid endarterectomy Plavix on hold due to GI bleed Risk modification  4. Acute esophagitis No acute flares History of GI malformation  status post cauterization Protonix reduced from twice daily to daily dosing   Meds ordered this encounter  Medications  . carvedilol (COREG) 6.25 MG tablet    Sig: TAKE 1 TABLET BY MOUTH TWO  TIMES DAILY WITH A MEAL    Dispense:  180 tablet    Refill:  1    Discontinue previous dose  . pantoprazole (PROTONIX) 40 MG tablet    Sig: Take 1 tablet (40 mg total) by mouth daily.    Dispense:  30 tablet    Refill:  2    Dose change    Follow-up: Return in about 3 months (around 12/14/2016) for Follow-up on chronic medical conditions.   This note has been created with Surveyor, quantity. Any transcriptional errors are unintentional.     Arnoldo Morale MD

## 2016-09-13 NOTE — Patient Instructions (Signed)

## 2016-09-15 ENCOUNTER — Other Ambulatory Visit: Payer: Medicare Other

## 2016-09-19 ENCOUNTER — Other Ambulatory Visit: Payer: Medicare Other

## 2016-09-20 ENCOUNTER — Ambulatory Visit: Payer: Medicare Other | Attending: Family Medicine

## 2016-09-20 DIAGNOSIS — I10 Essential (primary) hypertension: Secondary | ICD-10-CM

## 2016-09-21 DIAGNOSIS — J449 Chronic obstructive pulmonary disease, unspecified: Secondary | ICD-10-CM | POA: Diagnosis not present

## 2016-09-21 LAB — LIPID PANEL
CHOLESTEROL TOTAL: 128 mg/dL (ref 100–199)
Chol/HDL Ratio: 2.8 ratio (ref 0.0–4.4)
HDL: 46 mg/dL (ref >39)
LDL Calculated: 40 mg/dL (ref 0–99)
TRIGLYCERIDES: 210 mg/dL — AB (ref 0–149)
VLDL Cholesterol Cal: 42 mg/dL — ABNORMAL HIGH (ref 5–40)

## 2016-09-26 ENCOUNTER — Other Ambulatory Visit: Payer: Self-pay | Admitting: Family Medicine

## 2016-09-28 ENCOUNTER — Other Ambulatory Visit: Payer: Self-pay | Admitting: Pharmacist

## 2016-09-28 MED ORDER — PANTOPRAZOLE SODIUM 40 MG PO TBEC: 40.0000 mg | DELAYED_RELEASE_TABLET | Freq: Every day | ORAL | 0 refills | Status: DC

## 2016-10-06 ENCOUNTER — Telehealth: Payer: Self-pay | Admitting: *Deleted

## 2016-10-06 NOTE — Telephone Encounter (Signed)
Attempt to inform patient of lab results.  Pt asleep.  Request if pt will call office at convenience.

## 2016-10-06 NOTE — Telephone Encounter (Signed)
Pt called back returning the call from the nurse call, please call backtr

## 2016-10-06 NOTE — Telephone Encounter (Signed)
Pt name and DOB verified.  Pt aware of results from lab test. Pt verified understanding.

## 2016-10-10 ENCOUNTER — Encounter (HOSPITAL_COMMUNITY): Payer: Self-pay

## 2016-10-10 ENCOUNTER — Emergency Department (HOSPITAL_COMMUNITY): Payer: Medicare Other

## 2016-10-10 ENCOUNTER — Other Ambulatory Visit: Payer: Self-pay | Admitting: Licensed Clinical Social Worker

## 2016-10-10 ENCOUNTER — Emergency Department (HOSPITAL_COMMUNITY)
Admission: EM | Admit: 2016-10-10 | Discharge: 2016-10-10 | Disposition: A | Discharge status: Home / Self Care | Payer: Medicare Other | Attending: Emergency Medicine | Admitting: Emergency Medicine

## 2016-10-10 DIAGNOSIS — F17210 Nicotine dependence, cigarettes, uncomplicated: Secondary | ICD-10-CM | POA: Insufficient documentation

## 2016-10-10 DIAGNOSIS — E1122 Type 2 diabetes mellitus with diabetic chronic kidney disease: Secondary | ICD-10-CM | POA: Diagnosis not present

## 2016-10-10 DIAGNOSIS — I5042 Chronic combined systolic (congestive) and diastolic (congestive) heart failure: Secondary | ICD-10-CM | POA: Diagnosis not present

## 2016-10-10 DIAGNOSIS — Y999 Unspecified external cause status: Secondary | ICD-10-CM | POA: Insufficient documentation

## 2016-10-10 DIAGNOSIS — S60222A Contusion of left hand, initial encounter: Secondary | ICD-10-CM | POA: Diagnosis not present

## 2016-10-10 DIAGNOSIS — Y929 Unspecified place or not applicable: Secondary | ICD-10-CM | POA: Diagnosis not present

## 2016-10-10 DIAGNOSIS — N184 Chronic kidney disease, stage 4 (severe): Secondary | ICD-10-CM | POA: Diagnosis not present

## 2016-10-10 DIAGNOSIS — I130 Hypertensive heart and chronic kidney disease with heart failure and stage 1 through stage 4 chronic kidney disease, or unspecified chronic kidney disease: Secondary | ICD-10-CM | POA: Insufficient documentation

## 2016-10-10 DIAGNOSIS — J449 Chronic obstructive pulmonary disease, unspecified: Secondary | ICD-10-CM | POA: Diagnosis not present

## 2016-10-10 DIAGNOSIS — I2510 Atherosclerotic heart disease of native coronary artery without angina pectoris: Secondary | ICD-10-CM | POA: Insufficient documentation

## 2016-10-10 DIAGNOSIS — Y939 Activity, unspecified: Secondary | ICD-10-CM | POA: Diagnosis not present

## 2016-10-10 DIAGNOSIS — Z7902 Long term (current) use of antithrombotics/antiplatelets: Secondary | ICD-10-CM | POA: Diagnosis not present

## 2016-10-10 DIAGNOSIS — Z7982 Long term (current) use of aspirin: Secondary | ICD-10-CM | POA: Insufficient documentation

## 2016-10-10 DIAGNOSIS — Z79899 Other long term (current) drug therapy: Secondary | ICD-10-CM | POA: Diagnosis not present

## 2016-10-10 DIAGNOSIS — X509XXA Other and unspecified overexertion or strenuous movements or postures, initial encounter: Secondary | ICD-10-CM | POA: Diagnosis not present

## 2016-10-10 DIAGNOSIS — S60922A Unspecified superficial injury of left hand, initial encounter: Secondary | ICD-10-CM | POA: Diagnosis present

## 2016-10-10 DIAGNOSIS — M25532 Pain in left wrist: Secondary | ICD-10-CM | POA: Diagnosis not present

## 2016-10-10 MED ORDER — OXYCODONE-ACETAMINOPHEN 5-325 MG PO TABS
1.0000 | ORAL_TABLET | Freq: Once | ORAL | Status: AC
  Administered 2016-10-10: 1 via ORAL
  Filled 2016-10-10: qty 1

## 2016-10-10 MED ORDER — HYDROCODONE-ACETAMINOPHEN 5-325 MG PO TABS: 1.0000 | ORAL_TABLET | ORAL | 0 refills | Status: DC | PRN

## 2016-10-10 NOTE — Progress Notes (Signed)
Orthopedic Tech Progress Note Patient Details:  [name] 1950-02-24 [id number]  Ortho Devices Type of Ortho Device: Velcro wrist splint Ortho Device/Splint Interventions: Application   [name] 10/10/2016, 2:54 PM

## 2016-10-10 NOTE — ED Notes (Signed)
Ortho paged for wrist splint 

## 2016-10-10 NOTE — ED Triage Notes (Signed)
Pt states she "jerked" her wrist too hard 2 days ago, she now has pain in the left wrist. Some swelling noted. Strong radial pulse noted.

## 2016-10-10 NOTE — Patient Outreach (Signed)
sessment:  CSW was able to make initial contact with patient today to perform CSW screening with [facility] patients with acuity level 4 or 5.  CSW introduced self, explained role and types of services provided through [facility] ([facility]).  CSW further explained to patient that CSW wants to ensure that patient has all their needs met, prior to returning home.  CSW obtained two HIPAA compliant identifiers from patient, which included patient's name and date of birth. CSW received verbal permission from client on 10/10/16 for CSW to communicate with client about current client needs and status.  The following CSW screening was performed: The reason for patient's visit to the emergency department was due to recent fall at client's home two days ago and current pain and swelling in left wrist of client.  Client has had home health services in the home previously with [facility]  but said that she did not have any current home health support services in the home.  She said [facility] as scheduled. She has a primary care doctor. Dr. Enis Slipper, through the [facility]. She said her last appointment with Dr. Katherine Basset was last month. She could not remember when her follow up appointment with Dr. Katherine Basset was scheduled. She plans to call [facility] to determine date and time of her next appointment with Dr. Katherine Basset.  She said she had no current barriers to keep her from attending appointments with primary care doctor.  She gets her prescription medications through mail order pharmacy with [facility].She has no barriers to keep her from filling her prescribed medications or from receiving her prescribed medications. She said she did not visit an urgent care center prior to coming to the Emergency Department.  She has difficulties caring for self at home (difficulty with Activities of Daily Living)  She receives  in home support from her spouse.  She did not give any indicators of what would prevent another re-admission to hospital or prevent her from coming back to the Emergency Department.  CSW provided patient with a [facility] Welcome Packet, further explaining the program. Patient made aware that [facility] services does not interfere or replace home health or other community based services.  CSW also gave client [facility] CSW card with CSW [facility] phone number [phone]).  Client was appreciative of information regarding [facility] program. She did not indicate interest in participating in [facility] program at this time.   CSW thanked client for allowing CSW to visit with her on 10/10/16.   Norva Riffle.Celese Banner MSW, LCSW Licensed Clinical Social Worker [facility] [phone]

## 2016-10-10 NOTE — ED Provider Notes (Signed)
[facility] DEPT Provider Note   CSN: [id number] Arrival date & time: 10/10/16  1120   By signing my name below, I, Eunice Blase, attest that this documentation has been prepared under the direction and in the presence of Virgel Manifold, MD. Electronically signed, Eunice Blase, ED Scribe. 10/10/16. 2:26 PM.   History   Chief Complaint Chief Complaint  Patient presents with  . Wrist Pain   The history is provided by the patient and medical records. No language interpreter was used.    [name] is a 66 y.o. female with extensive cardiac history, h/o COPD and DM presenting to the Emergency Department concerning persistent L wrist pain s/p a fall that occurred in the morning 2 days ago. Associated swelling and decreased mobility. She describes constant, moderate to severe throbbing and aches worse with certain movements and palpation. She states she has taken tylenol without relief. No numbness or tingling. No other complaints at this time.   Past Medical History:  Diagnosis Date  . Anemia, chronic disease 05/16/2008   Qualifier: Diagnosis of  By: Percival Spanish, MD, Farrel Gordon    . Bradycardia    a. Limiting BB titration.  [name] CAD (coronary artery disease)    a. NSTEMI 2007 in setting of anemia; LHC 50% LM, 60% LAD, 30% RI, occluded RCA with L-R collaterals, normal EF, treated medically; B. LHC 01/2015 prox-mid RCA 100%, ostial to prox LAD 70%, LM 50%, mid-distal circ 90%-not surgical candidate due to renal and pulmonary issues).  . Carotid artery disease ([place])    a. Duplex 12/2014: left CEA patent with elevated velocities, 60-79% RICA (stable over serial exams), 40-98% LICA stenosis (stable over serial exams), 50% LECA, elevated velocities in bilateral subclavian arteries.  . Chronic combined systolic (congestive) and diastolic (congestive) heart failure ([place])    a. 06/2016 Echo: EF 40-45%, mod MR, mildly dil LA/RA, mod TR, PASP 73mmHg.  . CKD (chronic kidney disease), stage IV ([place])    . COPD (chronic obstructive pulmonary disease) ([facility])    home oxygen  . Dementia   . Depression   . Diabetes mellitus   . Diabetic retinopathy ([place])   . Essential hypertension   . GI AVM (gastrointestinal arteriovenous vascular malformation)    small bowel  . Hyperlipidemia   . Hypertensive heart disease with congestive heart failure ([place])   . Iron deficiency anemia    a. ? chronic GI blood loss-->plavix d/c'd 06/2016.  [name] Peripheral vascular disease ([place])    a.s /p left common iliac and external iliac artery stents. b. H/o subclavian, mesenteric, and celiac artery stenosis (see below).  . Protein-calorie malnutrition ([place]) 01/24/2015  . Pulmonary hypertension ([place])    a. 06/2016 Echo: PASP 74mmHg.  [name] Renal artery stenosis ([place])    a. Duplex 2014: no obvious evidence of hemodynamically significant stenosis >60%, bilateral intrarenal arteries exhibit absent diastolic flow. There is evidence of elevated velocities of the mid aorta. 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  . Tobacco abuse   . 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 Dieaulafoy's lesion 01/25/2012    Patient Active Problem List   Diagnosis Date Noted  . Bilateral carotid artery stenosis 08/22/2016  . Lung nodules 06/29/2016  . Acute kidney injury superimposed on chronic kidney disease ([place]) 06/23/2016  . Acute on chronic combined systolic and diastolic CHF (congestive heart failure) ([place]) 06/23/2016  . Hypokalemia 06/23/2016  . Acute esophagitis   . Angiodysplasia of duodenum   . Anemia 06/21/2016  .  Cardiac enzymes elevated   . Elevated liver enzymes   . Transaminitis   . Demand ischemia ([place])   . Coffee ground emesis   . Heme positive stool   . Dementia 03/04/2016  . CAP (community acquired pneumonia) 05/13/2015  . Indwelling catheter present on admission 04/21/2015  . Difficulty breathing   . Pulmonary infiltrates   . Staphylococcus aureus bacteremia   . Palliative care encounter   .  Malnutrition of moderate degree 03/30/2015  . HCAP (healthcare-associated pneumonia) 03/29/2015  . Acute respiratory failure ([place]) 03/29/2015  . Hypomagnesemia 03/29/2015  . Low serum vitamin D 03/29/2015  . Metabolic acidosis 16/11/9602  . Coronary artery disease involving native coronary artery of native heart with angina pectoris ([place])   . Sacral ulcer ([place])   . Urinary retention   . Hypocalcemia 03/20/2015  . Lewy body dementia without behavioral disturbance   . Protein-calorie malnutrition ([place]) 01/24/2015  . Abnormal nuclear stress test   . Stage 4 chronic kidney disease ([place])   . Carotid artery disease ([place])   . CAD (coronary artery disease)   . Chronic diastolic CHF (congestive heart failure) ([place])   . Essential hypertension   . Former tobacco use   . Bradycardia   . Nausea & vomiting 01/26/2012  . 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 Diet-controlled diabetes mellitus ([place]) 01/25/2012  . Iron deficiency anemia 01/25/2012  . Renal artery stenosis ([place]) 11/19/2010  . Peripheral vascular disease ([place]) 11/19/2010  . Diabetes ([place]) 05/16/2008  . Hyperlipidemia 05/16/2008  . OBESITY 05/16/2008  . Anemia, chronic disease 05/16/2008  . ANXIETY DEPRESSION 05/16/2008  . History of CVA (cerebrovascular accident) 05/16/2008  . Chronic obstructive pulmonary disease ([place]) 05/16/2008  . DIVERTICULITIS, HX OF 05/16/2008    Past Surgical History:  Procedure Laterality Date  . APPENDECTOMY    . CARDIAC CATHETERIZATION N/A 01/23/2015   Procedure: Left Heart Cath and Coronary Angiography;  Surgeon: [name], MD;  Location: [facility];  Service: Cardiovascular;  Laterality: N/A;  . CAROTID ENDARTERECTOMY    . CESAREAN SECTION    . CHOLECYSTECTOMY    . COLONOSCOPY WITH ESOPHAGOGASTRODUODENOSCOPY (EGD)  06/22/2016  . ENTEROSCOPY  01/26/2012   Procedure: ENTEROSCOPY;  Surgeon: Gatha Mayer, MD;  Location: [facility] END[facility];  Service:  Endoscopy;  Laterality: N/A;  . ENTEROSCOPY N/A 03/25/2015   Procedure: ENTEROSCOPY;  Surgeon: Doran Stabler, MD;  Location: [facility] ENDOSCOPY;  Service: Endoscopy;  Laterality: N/A;  . ENTEROSCOPY N/A 06/22/2016   Procedure: ENTEROSCOPY;  Surgeon: Jerene Bears, MD;  Location[facility]COPY;  Service: Endoscopy;  Laterality: N/A;  . ESOPHAGOGASTRODUODENOSCOPY (EGD) WITH PROPOFOL N/A 06/22/2016   Procedure: ESOPHAGOGASTRODUODENOSCOPY (EGD) WITH PROPOFOL;  Surgeon: Jerene Bears, MD;  Location: [facility] ENDOSCOPY;  Service: Endoscopy;  Laterality: N/A;  . ILIAC ARTERY STENT     left  . SHOULDER ARTHROSCOPY    . TONSILLECTOMY      OB History    No data available       Home Medications    Prior to Admission medications   Medication Sig Start Date End Date Taking? Authorizing Provider  acetaminophen (TYLENOL) 325 MG tablet Take 325 mg by mouth every 6 (six) hours as needed.    [provider]  albuterol (PROVENTIL HFA;VENTOLIN HFA) 108 (90 Base) MCG/ACT inhaler Inhale 2 puffs into the lungs every 6 (six) hours as needed for wheezing or shortness of breath. 06/15/16   Arnoldo Morale, MD  aspirin EC 81 MG tablet Take 81 mg by mouth daily.    [provider]  atorvastatin (LIPITOR) 80 MG tablet TAKE 1 TABLET BY MOUTH  DAILY AT 6 PM. 08/12/16   Arnoldo Morale, MD  Blood Glucose Monitoring Suppl (TRUE METRIX AIR GLUCOSE METER) DEVI 1 Device by Does not apply route 4 (four) times daily -  before meals and at bedtime. 04/22/15   Arnoldo Morale, MD  calcitRIOL (ROCALTROL) 0.5 MCG capsule Take 1 capsule (0.5 mcg total) by mouth daily. 06/15/16   Arnoldo Morale, MD  calcium carbonate (OS-CAL - DOSED IN MG OF ELEMENTAL CALCIUM) 1250 (500 Ca) MG tablet Take 1 tablet (500 mg of elemental calcium total) by mouth 3 (three) times daily with meals. 04/07/15   Hongalgi, Lenis Dickinson, MD  carvedilol (COREG) 6.25 MG tablet TAKE 1 TABLET BY MOUTH TWO  TIMES DAILY WITH A MEAL 09/13/16   Arnoldo Morale, MD  clopidogrel  (PLAVIX) 75 MG tablet Take 1 tablet by mouth daily with breakfast. 07/05/16   [provider]  collagenase (SANTYL) ointment Apply topically daily. Apply to right side of gluteal cleft Unstageable Pressure Injury once daily and PRN soiling. 04/07/15   Hongalgi, Lenis Dickinson, MD  donepezil (ARICEPT) 10 MG tablet Take 1 tablet (10 mg total) by mouth at bedtime. 06/15/16   Arnoldo Morale, MD  ferrous sulfate 325 (65 FE) MG tablet Take 1 tablet (325 mg total) by mouth 2 (two) times daily with a meal. 06/15/16   Arnoldo Morale, MD  fish oil-omega-3 fatty acids 1000 MG capsule Take 1 g by mouth 3 (three) times daily. Reported on 04/09/2015    [provider]  furosemide (LASIX) 80 MG tablet Take 1 tablet (80 mg total) by mouth 2 (two) times daily. 06/15/16   Arnoldo Morale, MD  glucose blood (TRUE METRIX BLOOD GLUCOSE TEST) test strip Use as instructed 04/22/15   Arnoldo Morale, MD  hydrALAZINE (APRESOLINE) 50 MG tablet Take 1 tablet (50 mg total) by mouth 3 (three) times daily. 08/12/16   Arnoldo Morale, MD  HYDROcodone-acetaminophen (NORCO/VICODIN) 5-325 MG tablet Take 1 tablet by mouth every 6 (six) hours as needed for moderate pain. 08/14/16   Little, Traci M, PA-C  hydrOXYzine (ATARAX/VISTARIL) 25 MG tablet Take 1 tablet (25 mg total) by mouth 3 (three) times daily as needed. 12/23/15   Arnoldo Morale, MD  isosorbide mononitrate (IMDUR) 120 MG 24 hr tablet Take 1 tablet (120 mg total) by mouth daily. 08/22/16   Minus Breeding, MD  Lancet Devices [facility]) lancets Use as instructed daily. 04/21/15   Arnoldo Morale, MD  mometasone-formoterol (DULERA) 100-5 MCG/ACT AERO Inhale 1 puff into the lungs daily. 06/15/16   Arnoldo Morale, MD  nitroGLYCERIN (NITROSTAT) 0.4 MG SL tablet Place 0.4 mg under the tongue every 5 (five) minutes as needed for chest pain. X 3 doses    [provider]  Nutritional Supplements (FEEDING SUPPLEMENT, NEPRO CARB STEADY,) LIQD Take 237 mLs by mouth 2 (two) times daily  between meals. 04/07/15   Hongalgi, Lenis Dickinson, MD  pantoprazole (PROTONIX) 40 MG tablet Take 1 tablet (40 mg total) by mouth daily. 09/28/16   Arnoldo Morale, MD  sertraline (ZOLOFT) 50 MG tablet Take 1 tablet (50 mg total) by mouth daily. 08/12/16   Arnoldo Morale, MD  traMADol (ULTRAM) 50 MG tablet Take 1 tablet (50 mg total) by mouth every 8 (eight) hours as needed. 07/25/16   Virgel Manifold, MD    Family History Family History  Problem Relation Age of Onset  . Hypertension Mother   . Heart attack Mother   . Hypertension Father   . Angina Father   . Cancer Sister   . Cancer Brother   . Stroke Neg Hx     Social History Social History  Substance Use Topics  . Smoking status: Current Every Day Smoker    Packs/day: 1.00    Years: 36.00    Types: Cigarettes  . Smokeless tobacco: Never Used  . Alcohol use No     Allergies   Patient has no known allergies.   Review of Systems Review of Systems  Constitutional: Negative for chills, diaphoresis and fever.  Respiratory: Negative for shortness of breath.   Gastrointestinal: Negative for abdominal pain, nausea and vomiting.  Musculoskeletal: Positive for arthralgias, joint swelling and myalgias.  Skin: Negative for color change and wound.  Neurological: Negative for weakness and numbness.  All other systems reviewed and are negative.    Physical Exam Updated Vital Signs BP 98/71 (BP Location: Right Arm)   Pulse (!) 52   Temp 97.6 F (36.4 C) (Oral)   Resp 16   Ht 4\' 11"  (1.499 m)   Wt 118 lb (53.5 kg)   SpO2 100%   BMI 23.83 kg/m   Physical Exam  Constitutional: She is oriented to person, place, and time. She appears well-developed and well-nourished.  HENT:  Head: Normocephalic.  Eyes: EOM are normal.  Neck: Normal range of motion.  Pulmonary/Chest: Effort normal.  Abdominal: She exhibits no distension.  Musculoskeletal: Normal range of motion. She exhibits tenderness.  Swelling and tenderness to distal L wrist and  dorsum of proximal hand. Increased pain with ROM. NVI.  Neurological: She is alert and oriented to person, place, and time.  Psychiatric: She has a normal mood and affect.  Nursing note and vitals reviewed.  ED Treatments / Results  DIAGNOSTIC STUDIES: Oxygen Saturation is 100% on RA, NL by my interpretation.    COORDINATION OF CARE: 2:06 PM-Discussed next steps with pt. Pt verbalized understanding and is agreeable with the plan. Will order/ Rx medications.   Labs (all labs ordered are listed, but only abnormal results are displayed) Labs Reviewed - No data to display  EKG  EKG Interpretation None       Radiology Dg Wrist Complete Left  Result Date: 10/10/2016 CLINICAL DATA:  Posterior left wrist pain since a fall getting out of bed yesterday. Initial encounter. EXAM: LEFT WRIST - COMPLETE 3+ VIEW COMPARISON:  None. FINDINGS: No acute bony or joint abnormality is identified. TFC chondrocalcinosis is noted. IMPRESSION: No acute abnormality. Electronically Signed   By: Inge Rise M.D.   On: 10/10/2016 12:41    Procedures Procedures (including critical care time)  Medications Ordered in ED Medications - No data to display   Initial Impression / Assessment and Plan / ED Course  I have reviewed the triage vital signs and the nursing notes.  Pertinent labs & imaging results that were available during my care of the patient were reviewed by me and considered in my medical decision making (see chart for details).     Negative imaging. Splint for comfort..  Medication. Return precautions discussed.  Final Clinical Impressions(s) / ED Diagnoses   Final diagnoses:  Contusion of left hand, initial encounter    New Prescriptions New Prescriptions   No medications on file    I personally preformed the services scribed in my presence. The recorded information has been reviewed is accurate. Annie Main  Wilson Singer, MD.    Virgel Manifold, MD 10/26/16 [phone]

## 2016-10-22 DIAGNOSIS — J449 Chronic obstructive pulmonary disease, unspecified: Secondary | ICD-10-CM | POA: Diagnosis not present

## 2016-10-25 ENCOUNTER — Other Ambulatory Visit: Payer: Self-pay | Admitting: Family Medicine

## 2016-10-25 DIAGNOSIS — I5032 Chronic diastolic (congestive) heart failure: Secondary | ICD-10-CM

## 2016-10-26 ENCOUNTER — Other Ambulatory Visit: Payer: Self-pay | Admitting: Family Medicine

## 2016-10-26 DIAGNOSIS — F341 Dysthymic disorder: Secondary | ICD-10-CM

## 2016-11-09 DIAGNOSIS — N184 Chronic kidney disease, stage 4 (severe): Secondary | ICD-10-CM | POA: Diagnosis not present

## 2016-11-09 DIAGNOSIS — N179 Acute kidney failure, unspecified: Secondary | ICD-10-CM | POA: Diagnosis not present

## 2016-11-09 DIAGNOSIS — E8770 Fluid overload, unspecified: Secondary | ICD-10-CM | POA: Diagnosis not present

## 2016-11-09 DIAGNOSIS — I701 Atherosclerosis of renal artery: Secondary | ICD-10-CM | POA: Diagnosis not present

## 2016-11-09 DIAGNOSIS — I10 Essential (primary) hypertension: Secondary | ICD-10-CM | POA: Diagnosis not present

## 2016-11-15 MED FILL — hydrOXYzine HCL 25 MG TABS: 25 | 20 days supply | Qty: 60 | Fill #0

## 2016-11-15 MED FILL — FERROUS SULFATE 325 MG TAB: 325 (65 FE) | 30 days supply | Qty: 90 | Fill #3

## 2016-11-21 DIAGNOSIS — J449 Chronic obstructive pulmonary disease, unspecified: Secondary | ICD-10-CM | POA: Diagnosis not present

## 2016-11-22 ENCOUNTER — Other Ambulatory Visit: Payer: Self-pay | Admitting: Family Medicine

## 2016-12-15 ENCOUNTER — Ambulatory Visit: Payer: Medicare Other | Admitting: Family Medicine

## 2016-12-19 ENCOUNTER — Other Ambulatory Visit: Payer: Self-pay | Admitting: Family Medicine

## 2016-12-20 ENCOUNTER — Encounter: Payer: Self-pay | Admitting: Emergency Medicine

## 2016-12-20 ENCOUNTER — Emergency Department
Admission: EM | Admit: 2016-12-20 | Discharge: 2016-12-20 | Disposition: A | Discharge status: Home / Self Care | Payer: Medicare Other | Attending: Student in an Organized Health Care Education/Training Program | Admitting: Student in an Organized Health Care Education/Training Program

## 2016-12-20 ENCOUNTER — Emergency Department: Payer: Medicare Other

## 2016-12-20 DIAGNOSIS — I2510 Atherosclerotic heart disease of native coronary artery without angina pectoris: Secondary | ICD-10-CM | POA: Diagnosis not present

## 2016-12-20 DIAGNOSIS — R310 Gross hematuria: Secondary | ICD-10-CM | POA: Diagnosis not present

## 2016-12-20 DIAGNOSIS — I5042 Chronic combined systolic (congestive) and diastolic (congestive) heart failure: Secondary | ICD-10-CM | POA: Insufficient documentation

## 2016-12-20 DIAGNOSIS — F0390 Unspecified dementia without behavioral disturbance: Secondary | ICD-10-CM | POA: Diagnosis not present

## 2016-12-20 DIAGNOSIS — F17210 Nicotine dependence, cigarettes, uncomplicated: Secondary | ICD-10-CM | POA: Diagnosis not present

## 2016-12-20 DIAGNOSIS — Z79899 Other long term (current) drug therapy: Secondary | ICD-10-CM | POA: Diagnosis not present

## 2016-12-20 DIAGNOSIS — J449 Chronic obstructive pulmonary disease, unspecified: Secondary | ICD-10-CM | POA: Insufficient documentation

## 2016-12-20 DIAGNOSIS — Z7902 Long term (current) use of antithrombotics/antiplatelets: Secondary | ICD-10-CM | POA: Insufficient documentation

## 2016-12-20 DIAGNOSIS — E1122 Type 2 diabetes mellitus with diabetic chronic kidney disease: Secondary | ICD-10-CM | POA: Insufficient documentation

## 2016-12-20 DIAGNOSIS — R319 Hematuria, unspecified: Secondary | ICD-10-CM | POA: Diagnosis not present

## 2016-12-20 DIAGNOSIS — N184 Chronic kidney disease, stage 4 (severe): Secondary | ICD-10-CM | POA: Diagnosis not present

## 2016-12-20 DIAGNOSIS — I130 Hypertensive heart and chronic kidney disease with heart failure and stage 1 through stage 4 chronic kidney disease, or unspecified chronic kidney disease: Secondary | ICD-10-CM | POA: Insufficient documentation

## 2016-12-20 DIAGNOSIS — Z7982 Long term (current) use of aspirin: Secondary | ICD-10-CM | POA: Diagnosis not present

## 2016-12-20 LAB — COMPREHENSIVE METABOLIC PANEL
ALBUMIN: 2.7 g/dL — AB (ref 3.5–5.0)
ALT: 10 U/L — ABNORMAL LOW (ref 14–54)
ANION GAP: 7 (ref 5–15)
AST: 19 U/L (ref 15–41)
Alkaline Phosphatase: 80 U/L (ref 38–126)
BUN: 46 mg/dL — AB (ref 6–20)
CO2: 19 mmol/L — AB (ref 22–32)
Calcium: 8.6 mg/dL — ABNORMAL LOW (ref 8.9–10.3)
Chloride: 112 mmol/L — ABNORMAL HIGH (ref 101–111)
Creatinine, Ser: 2.17 mg/dL — ABNORMAL HIGH (ref 0.44–1.00)
GFR calc Af Amer: 26 mL/min — ABNORMAL LOW (ref >60)
GFR calc non Af Amer: 22 mL/min — ABNORMAL LOW (ref >60)
GLUCOSE: 110 mg/dL — AB (ref 65–99)
POTASSIUM: 2.9 mmol/L — AB (ref 3.5–5.1)
SODIUM: 138 mmol/L (ref 135–145)
Total Bilirubin: 0.6 mg/dL (ref 0.3–1.2)
Total Protein: 6 g/dL — ABNORMAL LOW (ref 6.5–8.1)

## 2016-12-20 LAB — CBC
HEMATOCRIT: 38.8 % (ref 35.0–47.0)
HEMOGLOBIN: 13.1 g/dL (ref 12.0–16.0)
MCH: 29.3 pg (ref 26.0–34.0)
MCHC: 33.7 g/dL (ref 32.0–36.0)
MCV: 87 fL (ref 80.0–100.0)
Platelets: 193 10*3/uL (ref 150–440)
RBC: 4.45 MIL/uL (ref 3.80–5.20)
RDW: 15.3 % — ABNORMAL HIGH (ref 11.5–14.5)
WBC: 14.3 10*3/uL — AB (ref 3.6–11.0)

## 2016-12-20 LAB — URINALYSIS, COMPLETE (UACMP) WITH MICROSCOPIC
BACTERIA UA: NONE SEEN
SQUAMOUS EPITHELIAL / LPF: NONE SEEN

## 2016-12-20 LAB — PROTIME-INR
INR: 1.1
Prothrombin Time: 14.1 seconds (ref 11.4–15.2)

## 2016-12-20 LAB — APTT: aPTT: 33 seconds (ref 24–36)

## 2016-12-20 MED ORDER — POTASSIUM CHLORIDE 10 MEQ/100ML IV SOLN
10.0000 meq | Freq: Once | INTRAVENOUS | Status: AC
  Administered 2016-12-20: 10 meq via INTRAVENOUS
  Filled 2016-12-20: qty 100

## 2016-12-20 MED ORDER — POTASSIUM CHLORIDE ER 10 MEQ PO TBCR: 10.0000 meq | EXTENDED_RELEASE_TABLET | Freq: Every day | ORAL | 0 refills | Status: DC

## 2016-12-20 MED ORDER — POTASSIUM CHLORIDE CRYS ER 20 MEQ PO TBCR
40.0000 meq | EXTENDED_RELEASE_TABLET | Freq: Two times a day (BID) | ORAL | Status: DC
  Administered 2016-12-20: 40 meq via ORAL
  Filled 2016-12-20: qty 2

## 2016-12-20 NOTE — Discharge Instructions (Signed)
Please schedule an appointment with urology as soon as possible for another scan to evaluate for cause of bleeding. Please schedule an appointment as soon as possible with primary care for repeat blood work. Take potassium tablet once daily for 5 days for low potassium.

## 2016-12-20 NOTE — ED Provider Notes (Signed)
[facility] Emergency Department Provider Note  ____________________________________________  Time seen: Approximately 6:07 PM  I have reviewed the triage vital signs and the nursing notes.   HISTORY  Chief Complaint Hematuria    HPI [name] is a 66 y.o. female with PMH CHF, diabetes, CKD stage 4, HTN, diverticulitis, dementia, anemia that presents to the emergency department for evaluation of gross hematuria for one day. Patient has dementia and husband is a poor historian so history is limited. She has passed no clots. Patient remembers this happening previously and states that she elevates her legs and takes pain medicine and it resolves. She denies blood coming from rectum or vagina. Patient takes Plavix. Patient denies fever, abdominal pain, flank pain, dysuria, urgency, frequency.    Past Medical History:  Diagnosis Date  . Anemia, chronic disease 05/16/2008   Qualifier: Diagnosis of  By: Percival Spanish, MD, Farrel Gordon    . Bradycardia    a. Limiting BB titration.  [name] CAD (coronary artery disease)    a. NSTEMI 2007 in setting of anemia; LHC 50% LM, 60% LAD, 30% RI, occluded RCA with L-R collaterals, normal EF, treated medically; B. LHC 01/2015 prox-mid RCA 100%, ostial to prox LAD 70%, LM 50%, mid-distal circ 90%-not surgical candidate due to renal and pulmonary issues).  . Carotid artery disease ([place])    a. Duplex 12/2014: left CEA patent with elevated velocities, 60-79% RICA (stable over serial exams), 46-96% LICA stenosis (stable over serial exams), 50% LECA, elevated velocities in bilateral subclavian arteries.  . Chronic combined systolic (congestive) and diastolic (congestive) heart failure ([place])    a. 06/2016 Echo: EF 40-45%, mod MR, mildly dil LA/RA, mod TR, PASP 34mmHg.  . CKD (chronic kidney disease), stage IV ([place])   . COPD (chronic obstructive pulmonary disease) (HCC)    home oxygen  . Dementia   . Depression   . Diabetes mellitus   .  Diabetic retinopathy ([place])   . Essential hypertension   . GI AVM (gastrointestinal arteriovenous vascular malformation)    small bowel  . Hyperlipidemia   . Hypertensive heart disease with congestive heart failure ([place])   . Iron deficiency anemia    a. ? chronic GI blood loss-->plavix d/c'd 06/2016.  [name] Peripheral vascular disease ([place])    a.s /p left common iliac and external iliac artery stents. b. H/o subclavian, mesenteric, and celiac artery stenosis (see below).  . Protein-calorie malnutrition ([place]) 01/24/2015  . Pulmonary hypertension ([place])    a. 06/2016 Echo: PASP 78mmHg.  [name] Renal artery stenosis ([place])    a. Duplex 2014: no obvious evidence of hemodynamically significant stenosis >60%, bilateral intrarenal arteries exhibit absent diastolic flow. There is evidence of elevated velocities of the mid aorta. 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  . Tobacco abuse   . 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 Dieaulafoy's lesion 01/25/2012    Patient Active Problem List   Diagnosis Date Noted  . Bilateral carotid artery stenosis 08/22/2016  . Lung nodules 06/29/2016  . Acute kidney injury superimposed on chronic kidney disease ([place]) 06/23/2016  . Acute on chronic combined systolic and diastolic CHF (congestive heart failure) ([place]) 06/23/2016  . Hypokalemia 06/23/2016  . Acute esophagitis   . Angiodysplasia of duodenum   . Anemia 06/21/2016  . Cardiac enzymes elevated   . Elevated liver enzymes   . Transaminitis   . Demand ischemia ([place])   . Coffee ground emesis   . Heme positive stool   .  Dementia 03/04/2016  . CAP (community acquired pneumonia) 05/13/2015  . Indwelling catheter present on admission 04/21/2015  . Difficulty breathing   . Pulmonary infiltrates   . Staphylococcus aureus bacteremia   . Palliative care encounter   . Malnutrition of moderate degree 03/30/2015  . HCAP (healthcare-associated pneumonia) 03/29/2015  . Acute respiratory failure  ([place]) 03/29/2015  . Hypomagnesemia 03/29/2015  . Low serum vitamin D 03/29/2015  . Metabolic acidosis 65/78/4696  . Coronary artery disease involving native coronary artery of native heart with angina pectoris ([place])   . Sacral ulcer ([place])   . Urinary retention   . Hypocalcemia 03/20/2015  . Lewy body dementia without behavioral disturbance   . Protein-calorie malnutrition ([place]) 01/24/2015  . Abnormal nuclear stress test   . Stage 4 chronic kidney disease ([place])   . Carotid artery disease ([place])   . CAD (coronary artery disease)   . Chronic diastolic CHF (congestive heart failure) ([place])   . Essential hypertension   . Former tobacco use   . Bradycardia   . Nausea & vomiting 01/26/2012  . 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 Diet-controlled diabetes mellitus ([place]) 01/25/2012  . Iron deficiency anemia 01/25/2012  . Renal artery stenosis ([place]) 11/19/2010  . Peripheral vascular disease ([place]) 11/19/2010  . Diabetes ([place]) 05/16/2008  . Hyperlipidemia 05/16/2008  . OBESITY 05/16/2008  . Anemia, chronic disease 05/16/2008  . ANXIETY DEPRESSION 05/16/2008  . History of CVA (cerebrovascular accident) 05/16/2008  . Chronic obstructive pulmonary disease ([place]) 05/16/2008  . DIVERTICULITIS, HX OF 05/16/2008    Past Surgical History:  Procedure Laterality Date  . APPENDECTOMY    . CARDIAC CATHETERIZATION N/A 01/23/2015   Procedure: Left Heart Cath and Coronary Angiography;  Surgeon: [name], MD;  Location: [facility];  Service: Cardiovascular;  Laterality: N/A;  . CAROTID ENDARTERECTOMY    . CESAREAN SECTION    . CHOLECYSTECTOMY    . COLONOSCOPY WITH ESOPHAGOGASTRODUODENOSCOPY (EGD)  06/22/2016  . ENTEROSCOPY  01/26/2012   Procedure: ENTEROSCOPY;  Surgeon: Gatha Mayer, MD;  Location: [facility] ENDOSCOPY;  Service: Endoscopy;  Laterality: N/A;  . ENTEROSCOPY N/A 03/25/2015   Procedure: ENTEROSCOPY;  Surgeon: Doran Stabler, MD;  Location: [facility]  ENDOSCOPY;  Service: Endoscopy;  Laterality: N/A;  . ENTEROSCOPY N/A 06/22/2016   Procedure: ENTEROSCOPY;  Surgeon: Jerene Bears, MD;  Location: [facility] ENDOSCOPY;  Service: Endoscopy;  Laterality: N/A;  . ESOPHAGOGASTRODUODENOSCOPY (EGD) WITH PROPOFOL N/A 06/22/2016   Procedure: ESOPHAGOGASTRODUODENOSCOPY (EGD) WITH PROPOFOL;  Surgeon: Jerene Bears, MD;  Location: [facility] ENDOSCOPY;  Service: Endoscopy;  Laterality: N/A;  . ILIAC ARTERY STENT     left  . SHOULDER ARTHROSCOPY    . TONSILLECTOMY      Prior to Admission medications   Medication Sig Start Date End Date Taking? Authorizing Provider  acetaminophen (TYLENOL) 325 MG tablet Take 325 mg by mouth every 6 (six) hours as needed.    [provider]  albuterol (PROVENTIL HFA;VENTOLIN HFA) 108 (90 Base) MCG/ACT inhaler Inhale 2 puffs into the lungs every 6 (six) hours as needed for wheezing or shortness of breath. 06/15/16   Arnoldo Morale, MD  aspirin EC 81 MG tablet Take 81 mg by mouth daily.    [provider]  atorvastatin (LIPITOR) 80 MG tablet TAKE 1 TABLET BY MOUTH  DAILY AT 6 PM. 08/12/16   Arnoldo Morale, MD  Blood Glucose Monitoring Suppl (TRUE METRIX AIR GLUCOSE METER) DEVI 1 Device by  Does not apply route 4 (four) times daily -  before meals and at bedtime. 04/22/15   Arnoldo Morale, MD  calcitRIOL (ROCALTROL) 0.5 MCG capsule Take 1 capsule (0.5 mcg total) by mouth daily. 06/15/16   Arnoldo Morale, MD  calcium carbonate (OS-CAL - DOSED IN MG OF ELEMENTAL CALCIUM) 1250 (500 Ca) MG tablet Take 1 tablet (500 mg of elemental calcium total) by mouth 3 (three) times daily with meals. 04/07/15   Hongalgi, Lenis Dickinson, MD  carvedilol (COREG) 6.25 MG tablet TAKE 1 TABLET BY MOUTH TWO  TIMES DAILY WITH A MEAL 09/13/16   Arnoldo Morale, MD  clopidogrel (PLAVIX) 75 MG tablet Take 1 tablet by mouth daily with breakfast. 07/05/16   [provider]  collagenase (SANTYL) ointment Apply topically daily. Apply to right side of gluteal cleft Unstageable  Pressure Injury once daily and PRN soiling. 04/07/15   Hongalgi, Lenis Dickinson, MD  donepezil (ARICEPT) 10 MG tablet Take 1 tablet (10 mg total) by mouth at bedtime. 06/15/16   Arnoldo Morale, MD  ferrous sulfate 325 (65 FE) MG tablet Take 1 tablet (325 mg total) by mouth 2 (two) times daily with a meal. 06/15/16   Arnoldo Morale, MD  fish oil-omega-3 fatty acids 1000 MG capsule Take 1 g by mouth 3 (three) times daily. Reported on 04/09/2015    [provider]  furosemide (LASIX) 80 MG tablet Take 1 tablet (80 mg total) by mouth 2 (two) times daily. 06/15/16   Arnoldo Morale, MD  glucose blood (TRUE METRIX BLOOD GLUCOSE TEST) test strip Use as instructed 04/22/15   Arnoldo Morale, MD  hydrALAZINE (APRESOLINE) 50 MG tablet TAKE 1 TABLET BY MOUTH 3  TIMES DAILY 12/20/16   Tresa Garter, MD  HYDROcodone-acetaminophen (NORCO/VICODIN) 5-325 MG tablet Take 1 tablet by mouth every 6 (six) hours as needed for moderate pain. 08/14/16   Little, Traci M, PA-C  HYDROcodone-acetaminophen (NORCO/VICODIN) 5-325 MG tablet Take 1 tablet by mouth every 4 (four) hours as needed. 10/10/16   Virgel Manifold, MD  hydrOXYzine (ATARAX/VISTARIL) 25 MG tablet TAKE 1 TABLET BY MOUTH 3 TIMES DAILY AS NEEDED. 10/27/16   Arnoldo Morale, MD  isosorbide mononitrate (IMDUR) 120 MG 24 hr tablet Take 1 tablet (120 mg total) by mouth daily. 08/22/16   Minus Breeding, MD  Lancet Devices [facility]) lancets Use as instructed daily. 04/21/15   Arnoldo Morale, MD  mometasone-formoterol (DULERA) 100-5 MCG/ACT AERO Inhale 1 puff into the lungs daily. 06/15/16   Arnoldo Morale, MD  nitroGLYCERIN (NITROSTAT) 0.4 MG SL tablet Place 0.4 mg under the tongue every 5 (five) minutes as needed for chest pain. X 3 doses    [provider]  Nutritional Supplements (FEEDING SUPPLEMENT, NEPRO CARB STEADY,) LIQD Take 237 mLs by mouth 2 (two) times daily between meals. 04/07/15   Hongalgi, Lenis Dickinson, MD  pantoprazole (PROTONIX) 40 MG tablet TAKE 1 TABLET  BY MOUTH  DAILY 11/22/16   Arnoldo Morale, MD  potassium chloride (K-DUR) 10 MEQ tablet Take 1 tablet (10 mEq total) by mouth daily. 12/20/16 12/25/16  Laban Emperor, PA-C  sertraline (ZOLOFT) 50 MG tablet Take 1 tablet (50 mg total) by mouth daily. 08/12/16   Arnoldo Morale, MD  traMADol (ULTRAM) 50 MG tablet Take 1 tablet (50 mg total) by mouth every 8 (eight) hours as needed. 07/25/16   Virgel Manifold, MD    Allergies Patient has no known allergies.  Family History  Problem Relation Age of Onset  . Hypertension Mother   .  Heart attack Mother   . Hypertension Father   . Angina Father   . Cancer Sister   . Cancer Brother   . Stroke Neg Hx     Social History Social History  Substance Use Topics  . Smoking status: Current Every Day Smoker    Packs/day: 1.00    Years: 36.00    Types: Cigarettes  . Smokeless tobacco: Never Used  . Alcohol use No     Review of Systems  Constitutional: No fever/chills Gastrointestinal: No abdominal pain.  No nausea, no vomiting.  Genitourinary: Negative for dysuria. Musculoskeletal: Negative for musculoskeletal pain. Skin: Negative for rash, abrasions, lacerations, ecchymosis.   ____________________________________________   PHYSICAL EXAM:  VITAL SIGNS: ED Triage Vitals  Enc Vitals Group     BP 12/20/16 1714 (!) 104/59     Pulse Rate 12/20/16 1714 (!) 56     Resp 12/20/16 1714 16     Temp 12/20/16 1714 98.1 F (36.7 C)     Temp Source 12/20/16 1714 Oral     SpO2 12/20/16 1714 100 %     Weight 12/20/16 1714 118 lb (53.5 kg)     Height 12/20/16 1714 4\' 11"  (1.499 m)     Head Circumference --      Peak Flow --      Pain Score 12/20/16 1713 6     Pain Loc --      Pain Edu? --      Excl. in [place]? --      Constitutional: Well appearing and in no acute distress. Eyes: Conjunctivae are normal. PERRL. EOMI. Head: Atraumatic. ENT:      Ears:      Nose: No congestion/rhinnorhea.      Mouth/Throat: Mucous membranes are moist.  Neck: No  stridor.  Cardiovascular: Normal rate, regular rhythm.  Good peripheral circulation. Respiratory: Normal respiratory effort without tachypnea or retractions. Lungs CTAB. Good air entry to the bases with no decreased or absent breath sounds. Gastrointestinal: Bowel sounds 4 quadrants. Soft and nontender to palpation. No guarding or rigidity. No palpable masses. No distention. No CVA tenderness. Musculoskeletal: Full range of motion to all extremities. No gross deformities appreciated. Skin:  Skin is warm, dry and intact. No rash noted.   ____________________________________________   LABS (all labs ordered are listed, but only abnormal results are displayed)  Labs Reviewed  URINALYSIS, COMPLETE (UACMP) WITH MICROSCOPIC - Abnormal; Notable for the following:       Result Value   Color, Urine RED (*)    APPearance TURBID (*)    Glucose, UA   (*)    Value: TEST NOT REPORTED DUE TO COLOR INTERFERENCE OF URINE PIGMENT   Hgb urine dipstick   (*)    Value: TEST NOT REPORTED DUE TO COLOR INTERFERENCE OF URINE PIGMENT   Bilirubin Urine   (*)    Value: TEST NOT REPORTED DUE TO COLOR INTERFERENCE OF URINE PIGMENT   Ketones, ur   (*)    Value: TEST NOT REPORTED DUE TO COLOR INTERFERENCE OF URINE PIGMENT   Protein, ur   (*)    Value: TEST NOT REPORTED DUE TO COLOR INTERFERENCE OF URINE PIGMENT   Nitrite   (*)    Value: TEST NOT REPORTED DUE TO COLOR INTERFERENCE OF URINE PIGMENT   Leukocytes, UA   (*)    Value: TEST NOT REPORTED DUE TO COLOR INTERFERENCE OF URINE PIGMENT   All other components within normal limits  CBC - Abnormal; Notable for the  following:    WBC 14.3 (*)    RDW 15.3 (*)    All other components within normal limits  COMPREHENSIVE METABOLIC PANEL - Abnormal; Notable for the following:    Potassium 2.9 (*)    Chloride 112 (*)    CO2 19 (*)    Glucose, Bld 110 (*)    BUN 46 (*)    Creatinine, Ser 2.17 (*)    Calcium 8.6 (*)    Total Protein 6.0 (*)    Albumin 2.7 (*)     ALT 10 (*)    GFR calc non Af Amer 22 (*)    GFR calc Af Amer 26 (*)    All other components within normal limits  URINE CULTURE  APTT  PROTIME-INR   ____________________________________________  EKG   ____________________________________________  RADIOLOGY Robinette Haines, personally viewed and evaluated these images (plain radiographs) as part of my medical decision making, as well as reviewing the written report by the radiologist.  Ct Renal Stone Study  Result Date: 12/20/2016 CLINICAL DATA:  66 year old female with hematuria. EXAM: CT ABDOMEN AND PELVIS WITHOUT CONTRAST TECHNIQUE: Multidetector CT imaging of the abdomen and pelvis was performed following the standard protocol without IV contrast. COMPARISON:  Renal ultrasound dated 01/05/2016 and CT dated 09/07/2012 the FINDINGS: Evaluation of this exam is limited in the absence of intravenous contrast. Lower chest: There is a 10 x 8 mm subpleural nodule at the right lung base (series 4, image 9). A 3 mm nodule in the lateral right lower lobe (series 4, image 4) may represent a calcified granuloma. There is coronary vascular calcification. There is no intra-abdominal free air. Small free fluid within the pelvis. Hepatobiliary: The liver is unremarkable. There is cholecystectomy changes. Pancreas: Unremarkable. No pancreatic ductal dilatation or surrounding inflammatory changes. Spleen: Normal in size without focal abnormality. Adrenals/Urinary Tract: The adrenal glands are unremarkable. There is an extrarenal pelvis on the right with mild pelviectasis. High attenuating content within the right renal collecting system and pelvis most consistent with blood product. This is of uncertain etiology but concerning for underlying neoplasm. Evaluation is very limited on this noncontrast CT. Further evaluation with CT urography or direct visualization with ureteroscopy recommended. There is no hydronephrosis or nephrolithiasis. There is mild  right perinephric fluid. A 7 mm stone in the right pelvis (series 2, image 66) appears to be adjacent to the ureter and not in the right ureter. There is a 15 mm hypodense lesion in the upper pole of the left kidney which is not well characterized but likely represents a cyst. A subcentimeter high attenuating lesion in the inferior pole of the left kidney is also not characterized but may represent a complex/hemorrhagic cyst. There is no hydronephrosis or nephrolithiasis on the left. The left ureter is unremarkable. Stomach/Bowel: There is no evidence of bowel obstruction or active inflammation. Appendectomy. Vascular/Lymphatic: There is advanced aortoiliac atherosclerotic disease. There is extensive atherosclerotic calcification of the mesenteric vasculature as well as bilateral renovascular calcification. The IVC is grossly unremarkable. No portal venous gas. There is no adenopathy. Reproductive: The uterus is retroflexed. The ovaries are grossly unremarkable. Other: There is diffuse subcutaneous edema and stranding. No fluid collection. Musculoskeletal: Osteopenia with degenerative changes of the spine. Grade 1 L4-L5 anterolisthesis. IMPRESSION: 1. Mild caliectasis of the right renal pelvis containing blood product. This is of indeterminate etiology but underlying mass is not excluded. Further evaluation with CT urography or direct visualization with ureteroscopy recommended. There is no hydronephrosis or nephrolithiasis on  either side. 2. **An incidental finding of potential clinical significance has been found. A 10 mm right lung base subpleural nodule. Consider one of the following in 3 months for both low-risk and high-risk individuals: (a) repeat chest CT, (b) follow-up PET-CT, or (c) tissue sampling. This recommendation follows the consensus statement: Guidelines for Management of Incidental Pulmonary Nodules Detected on CT Images: From the Fleischner Society 2017; Radiology 2017; 284:228-243.** 3. Advanced  atherosclerotic disease of the aorta Aortic Atherosclerosis (ICD10-I70.0). Atherosclerotic disease of the mesenteric vasculature as well as renovascular calcification. 4. No bowel obstruction or active inflammation. Electronically Signed   By: Anner Crete M.D.   On: 12/20/2016 20:24    ____________________________________________    PROCEDURES  Procedure(s) performed:    Procedures    Medications  potassium chloride SA (K-DUR,KLOR-CON) CR tablet 40 mEq (40 mEq Oral Given 12/20/16 2216)  potassium chloride 10 mEq in 100 mL IVPB (0 mEq Intravenous Stopped 12/20/16 2159)     ____________________________________________   INITIAL IMPRESSION / ASSESSMENT AND PLAN / ED COURSE  Pertinent labs & imaging results that were available during my care of the patient were reviewed by me and considered in my medical decision making (see chart for details).  Review of the [place] CSRS was performed in accordance of the [place] prior to dispensing any controlled drugs.     Patient presented to the emergency department for evaluation of hematuria for one day. Patient has dementia and husband is a poor historian so history was limited.  Vital signs and exam are reassuring. Patient has visible blood on urine sample collected through straight cath. She also has minimal blood in panty liner with no visible clots. Urinalysis had interference due to blood and was unable to be analyzed. Urine was sent for culture. CT renal stone study was ordered since patient cannot have contrast due to decreased renal function. CT indicates mass with blood in right renal collecting system and 65mm kidney stone outside of right ureter. Dr. Junious Silk with urology was consulted and recommended outpatient follow-up in clinic for further imaging. Patient has known pulmonary nodules that are being followed with serial CTs.  She does have a white blood cell count on CBC. Hemoglobin and hematocrit stable. Patient has a low potassium,  which she has had previously. She was given IV potassium and oral potassium and will be sent home with a prescription. She will follow up with primary care for repeat blood work. Other labwork consistent with previous.  Dr. Quentin Cornwall was consulted and agrees with plan of discharge. Patient will be discharged home with prescriptions for potassium. Patient is to follow up with PCP as directed. Patient is given ED precautions to return to the ED for any worsening or new symptoms.     ____________________________________________  FINAL CLINICAL IMPRESSION(S) / ED DIAGNOSES  Final diagnoses:  Gross hematuria      NEW MEDICATIONS STARTED DURING THIS VISIT:  Discharge Medication List as of 12/20/2016 10:11 PM    START taking these medications   Details  potassium chloride (K-DUR) 10 MEQ tablet Take 1 tablet (10 mEq total) by mouth daily., Starting Tue 12/20/2016, Until Sun 12/25/2016, Print            This chart was dictated using voice recognition software/Dragon. Despite best efforts to proofread, errors can occur which can change the meaning. Any change was purely unintentional.    Laban Emperor, PA-C 12/21/16 0003    Merlyn Lot, MD 12/21/16 [phone]

## 2016-12-20 NOTE — ED Notes (Signed)
Pt ambulatory to wheelchair for discharge. Pt and husband verbalized understanding of discharge instructions, prescriptions and importance of follow-up care. VSS. Pt A&O x4. Skin warm and dry.

## 2016-12-20 NOTE — ED Triage Notes (Signed)
Pt via pov from home with hematuria since this morning. Pt states she has pain upon urination, but not at any other time. Pt afebrile with no flank pain. Pt alert & oriented with NAD noted.

## 2016-12-21 ENCOUNTER — Telehealth: Payer: Self-pay | Admitting: Internal Medicine

## 2016-12-21 ENCOUNTER — Telehealth: Payer: Self-pay | Admitting: Family Medicine

## 2016-12-21 DIAGNOSIS — R310 Gross hematuria: Secondary | ICD-10-CM

## 2016-12-21 NOTE — Telephone Encounter (Signed)
I am covering for Dr. Jarold Song. [name] seen in ER yesterday for gross hematuria. I received the results of labs and CT scan done in ER. [name] needs f/u with urology Dr. Junious Silk. Referral submitted. [name] has f/u appt with Dr. Jarold Song next mth.  IMPRESSION: 1. Mild caliectasis of the right renal pelvis containing blood product. This is of indeterminate etiology but underlying mass is not excluded. Further evaluation with CT urography or direct visualization with ureteroscopy recommended. There is no hydronephrosis or nephrolithiasis on either side. 2. **An incidental finding of potential clinical significance has been found. A 10 mm right lung base subpleural nodule. Consider one of the following in 3 months for both low-risk and high-risk individuals: (a) repeat chest CT, (b) follow-up PET-CT, or (c) tissue sampling. This recommendation follows the consensus statement: Guidelines for Management of Incidental Pulmonary Nodules Detected on CT Images: From the Fleischner Society 2017; Radiology 2017; 284:228-243.** 3. Advanced atherosclerotic disease of the aorta Aortic Atherosclerosis (ICD10-I70.0). Atherosclerotic disease of the mesenteric vasculature as well as renovascular calcification. 4. No bowel obstruction or active inflammation.

## 2016-12-21 NOTE — Telephone Encounter (Signed)
Pt called returning Dr's call, pt was informed that a referral was placed in for urology. Pt was reminded of appt with Dr Jarold Song and was placed on the wait list for sooner appointment.

## 2016-12-22 ENCOUNTER — Telehealth: Payer: Self-pay | Admitting: Internal Medicine

## 2016-12-22 DIAGNOSIS — J449 Chronic obstructive pulmonary disease, unspecified: Secondary | ICD-10-CM | POA: Diagnosis not present

## 2016-12-22 DIAGNOSIS — L02214 Cutaneous abscess of groin: Secondary | ICD-10-CM

## 2016-12-22 HISTORY — DX: Cutaneous abscess of groin: L02.214

## 2016-12-22 MED ORDER — CIPROFLOXACIN-CIPROFLOX HCL ER 500 MG PO TB24: 500.0000 mg | ORAL_TABLET | Freq: Every day | ORAL | 0 refills | Status: DC

## 2016-12-22 MED FILL — FERROUS SULFATE 325 MG TAB: 325 (65 FE) | 30 days supply | Qty: 90 | Fill #4

## 2016-12-22 NOTE — Telephone Encounter (Signed)
PC placed to pt and spouse this evening. I received results of urine cx done in ER 12/20/2016. + E. Coli. Husband states that pt was not sent home with abx and I do not see one on med list. I will send rxn to her pharmacy for cipro Extended release 500 mg daily x 7 days. According to UpToDate, dosing of extended release should be once a day in pts with CrCl<30. Husband will pick up rxn tomorrow.

## 2016-12-23 ENCOUNTER — Telehealth: Payer: Self-pay | Admitting: Internal Medicine

## 2016-12-23 LAB — URINE CULTURE

## 2016-12-23 MED ORDER — CEFUROXIME AXETIL 500 MG PO TABS: 500.0000 mg | ORAL_TABLET | Freq: Two times a day (BID) | ORAL | 0 refills | Status: DC

## 2016-12-23 MED FILL — hydrOXYzine HCL 25 MG TABS: 25 | 20 days supply | Qty: 60 | Fill #1

## 2016-12-23 NOTE — Telephone Encounter (Signed)
Phone call placed to patient and spouse today.  They were informed that the bacteria growing in the urine is resistant to Cipro.  Luckily they had not picked up the antibiotics as yet.  Antibiotics changed to Ceftin.

## 2016-12-27 ENCOUNTER — Observation Stay (HOSPITAL_COMMUNITY)
Admission: EM | Admit: 2016-12-27 | Discharge: 2016-12-29 | Disposition: A | Discharge status: Skilled Nursing Facility | Payer: Medicare Other | Attending: Family Medicine | Admitting: Family Medicine

## 2016-12-27 ENCOUNTER — Other Ambulatory Visit: Payer: Self-pay

## 2016-12-27 ENCOUNTER — Encounter (HOSPITAL_COMMUNITY): Payer: Self-pay | Admitting: *Deleted

## 2016-12-27 DIAGNOSIS — I10 Essential (primary) hypertension: Secondary | ICD-10-CM

## 2016-12-27 DIAGNOSIS — I5032 Chronic diastolic (congestive) heart failure: Secondary | ICD-10-CM | POA: Diagnosis present

## 2016-12-27 DIAGNOSIS — E876 Hypokalemia: Secondary | ICD-10-CM | POA: Diagnosis present

## 2016-12-27 DIAGNOSIS — R911 Solitary pulmonary nodule: Secondary | ICD-10-CM | POA: Diagnosis present

## 2016-12-27 DIAGNOSIS — Z791 Long term (current) use of non-steroidal anti-inflammatories (NSAID): Secondary | ICD-10-CM | POA: Insufficient documentation

## 2016-12-27 DIAGNOSIS — Z7902 Long term (current) use of antithrombotics/antiplatelets: Secondary | ICD-10-CM | POA: Diagnosis not present

## 2016-12-27 DIAGNOSIS — I2510 Atherosclerotic heart disease of native coronary artery without angina pectoris: Secondary | ICD-10-CM | POA: Diagnosis not present

## 2016-12-27 DIAGNOSIS — I252 Old myocardial infarction: Secondary | ICD-10-CM | POA: Insufficient documentation

## 2016-12-27 DIAGNOSIS — E1151 Type 2 diabetes mellitus with diabetic peripheral angiopathy without gangrene: Secondary | ICD-10-CM | POA: Insufficient documentation

## 2016-12-27 DIAGNOSIS — F341 Dysthymic disorder: Secondary | ICD-10-CM | POA: Diagnosis present

## 2016-12-27 DIAGNOSIS — N179 Acute kidney failure, unspecified: Secondary | ICD-10-CM | POA: Diagnosis not present

## 2016-12-27 DIAGNOSIS — N390 Urinary tract infection, site not specified: Secondary | ICD-10-CM | POA: Diagnosis present

## 2016-12-27 DIAGNOSIS — I130 Hypertensive heart and chronic kidney disease with heart failure and stage 1 through stage 4 chronic kidney disease, or unspecified chronic kidney disease: Secondary | ICD-10-CM | POA: Diagnosis not present

## 2016-12-27 DIAGNOSIS — E785 Hyperlipidemia, unspecified: Secondary | ICD-10-CM | POA: Insufficient documentation

## 2016-12-27 DIAGNOSIS — D638 Anemia in other chronic diseases classified elsewhere: Secondary | ICD-10-CM | POA: Diagnosis present

## 2016-12-27 DIAGNOSIS — E1122 Type 2 diabetes mellitus with diabetic chronic kidney disease: Secondary | ICD-10-CM | POA: Diagnosis not present

## 2016-12-27 DIAGNOSIS — I701 Atherosclerosis of renal artery: Secondary | ICD-10-CM | POA: Diagnosis not present

## 2016-12-27 DIAGNOSIS — L89159 Pressure ulcer of sacral region, unspecified stage: Secondary | ICD-10-CM | POA: Insufficient documentation

## 2016-12-27 DIAGNOSIS — E669 Obesity, unspecified: Secondary | ICD-10-CM | POA: Diagnosis present

## 2016-12-27 DIAGNOSIS — I429 Cardiomyopathy, unspecified: Secondary | ICD-10-CM | POA: Insufficient documentation

## 2016-12-27 DIAGNOSIS — Z9981 Dependence on supplemental oxygen: Secondary | ICD-10-CM | POA: Insufficient documentation

## 2016-12-27 DIAGNOSIS — N2889 Other specified disorders of kidney and ureter: Secondary | ICD-10-CM | POA: Diagnosis not present

## 2016-12-27 DIAGNOSIS — L02214 Cutaneous abscess of groin: Secondary | ICD-10-CM | POA: Diagnosis not present

## 2016-12-27 DIAGNOSIS — Z79899 Other long term (current) drug therapy: Secondary | ICD-10-CM | POA: Insufficient documentation

## 2016-12-27 DIAGNOSIS — F0280 Dementia in other diseases classified elsewhere without behavioral disturbance: Secondary | ICD-10-CM | POA: Diagnosis not present

## 2016-12-27 DIAGNOSIS — I272 Pulmonary hypertension, unspecified: Secondary | ICD-10-CM | POA: Insufficient documentation

## 2016-12-27 DIAGNOSIS — Z8673 Personal history of transient ischemic attack (TIA), and cerebral infarction without residual deficits: Secondary | ICD-10-CM

## 2016-12-27 DIAGNOSIS — R238 Other skin changes: Secondary | ICD-10-CM | POA: Diagnosis present

## 2016-12-27 DIAGNOSIS — I5042 Chronic combined systolic (congestive) and diastolic (congestive) heart failure: Secondary | ICD-10-CM | POA: Insufficient documentation

## 2016-12-27 DIAGNOSIS — L0292 Furuncle, unspecified: Secondary | ICD-10-CM | POA: Diagnosis not present

## 2016-12-27 DIAGNOSIS — N189 Chronic kidney disease, unspecified: Secondary | ICD-10-CM | POA: Diagnosis not present

## 2016-12-27 DIAGNOSIS — E11319 Type 2 diabetes mellitus with unspecified diabetic retinopathy without macular edema: Secondary | ICD-10-CM | POA: Insufficient documentation

## 2016-12-27 DIAGNOSIS — E118 Type 2 diabetes mellitus with unspecified complications: Secondary | ICD-10-CM

## 2016-12-27 DIAGNOSIS — Z7982 Long term (current) use of aspirin: Secondary | ICD-10-CM | POA: Insufficient documentation

## 2016-12-27 DIAGNOSIS — G3183 Dementia with Lewy bodies: Secondary | ICD-10-CM | POA: Diagnosis not present

## 2016-12-27 DIAGNOSIS — F418 Other specified anxiety disorders: Secondary | ICD-10-CM | POA: Diagnosis not present

## 2016-12-27 DIAGNOSIS — L02224 Furuncle of groin: Secondary | ICD-10-CM | POA: Insufficient documentation

## 2016-12-27 DIAGNOSIS — L909 Atrophic disorder of skin, unspecified: Secondary | ICD-10-CM

## 2016-12-27 DIAGNOSIS — D649 Anemia, unspecified: Secondary | ICD-10-CM | POA: Diagnosis present

## 2016-12-27 DIAGNOSIS — R319 Hematuria, unspecified: Secondary | ICD-10-CM | POA: Diagnosis not present

## 2016-12-27 DIAGNOSIS — I739 Peripheral vascular disease, unspecified: Secondary | ICD-10-CM

## 2016-12-27 DIAGNOSIS — J449 Chronic obstructive pulmonary disease, unspecified: Secondary | ICD-10-CM | POA: Diagnosis not present

## 2016-12-27 DIAGNOSIS — D509 Iron deficiency anemia, unspecified: Secondary | ICD-10-CM | POA: Insufficient documentation

## 2016-12-27 DIAGNOSIS — I779 Disorder of arteries and arterioles, unspecified: Secondary | ICD-10-CM | POA: Diagnosis present

## 2016-12-27 HISTORY — DX: Cutaneous abscess of groin: L02.214

## 2016-12-27 LAB — BASIC METABOLIC PANEL
ANION GAP: 12 (ref 5–15)
BUN: 68 mg/dL — ABNORMAL HIGH (ref 6–20)
CALCIUM: 8.6 mg/dL — AB (ref 8.9–10.3)
CO2: 10 mmol/L — AB (ref 22–32)
Chloride: 116 mmol/L — ABNORMAL HIGH (ref 101–111)
Creatinine, Ser: 3.81 mg/dL — ABNORMAL HIGH (ref 0.44–1.00)
GFR calc non Af Amer: 11 mL/min — ABNORMAL LOW (ref >60)
GFR, EST AFRICAN AMERICAN: 13 mL/min — AB (ref >60)
Glucose, Bld: 93 mg/dL (ref 65–99)
Potassium: 3.3 mmol/L — ABNORMAL LOW (ref 3.5–5.1)
Sodium: 138 mmol/L (ref 135–145)

## 2016-12-27 LAB — CBC
HCT: 32.1 % — ABNORMAL LOW (ref 36.0–46.0)
HEMOGLOBIN: 10.5 g/dL — AB (ref 12.0–15.0)
MCH: 28.5 pg (ref 26.0–34.0)
MCHC: 32.7 g/dL (ref 30.0–36.0)
MCV: 87 fL (ref 78.0–100.0)
Platelets: 279 10*3/uL (ref 150–400)
RBC: 3.69 MIL/uL — AB (ref 3.87–5.11)
RDW: 15.1 % (ref 11.5–15.5)
WBC: 12 10*3/uL — AB (ref 4.0–10.5)

## 2016-12-27 LAB — CBG MONITORING, ED
GLUCOSE-CAPILLARY: 90 mg/dL (ref 65–99)
GLUCOSE-CAPILLARY: 82 mg/dL (ref 65–99)

## 2016-12-27 LAB — MAGNESIUM: MAGNESIUM: 1.8 mg/dL (ref 1.7–2.4)

## 2016-12-27 LAB — GLUCOSE, CAPILLARY: GLUCOSE-CAPILLARY: 96 mg/dL (ref 65–99)

## 2016-12-27 MED ORDER — INSULIN ASPART 100 UNIT/ML ~~LOC~~ SOLN: 0.0000 [IU] | Freq: Three times a day (TID) | SUBCUTANEOUS | Status: DC

## 2016-12-27 MED ORDER — ACETAMINOPHEN 650 MG RE SUPP: 650.0000 mg | Freq: Four times a day (QID) | RECTAL | Status: DC | PRN

## 2016-12-27 MED ORDER — LIDOCAINE-EPINEPHRINE (PF) 2 %-1:200000 IJ SOLN
10.0000 mL | Freq: Once | INTRAMUSCULAR | Status: AC
  Administered 2016-12-27: 10 mL
  Filled 2016-12-27: qty 20

## 2016-12-27 MED ORDER — CEFUROXIME AXETIL 500 MG PO TABS
500.0000 mg | ORAL_TABLET | Freq: Two times a day (BID) | ORAL | Status: DC
  Administered 2016-12-27 – 2016-12-28 (×2): 500 mg via ORAL
  Filled 2016-12-27 (×2): qty 1

## 2016-12-27 MED ORDER — POTASSIUM CHLORIDE CRYS ER 20 MEQ PO TBCR
40.0000 meq | EXTENDED_RELEASE_TABLET | ORAL | Status: DC
  Administered 2016-12-27: 40 meq via ORAL
  Filled 2016-12-27: qty 2

## 2016-12-27 MED ORDER — ACETAMINOPHEN 325 MG PO TABS
650.0000 mg | ORAL_TABLET | Freq: Four times a day (QID) | ORAL | Status: DC | PRN
  Administered 2016-12-28 – 2016-12-29 (×3): 650 mg via ORAL
  Filled 2016-12-27 (×3): qty 2

## 2016-12-27 MED ORDER — ONDANSETRON HCL 4 MG PO TABS: 4.0000 mg | ORAL_TABLET | Freq: Four times a day (QID) | ORAL | Status: DC | PRN

## 2016-12-27 MED ORDER — CLOTRIMAZOLE 1 % EX SOLN
Freq: Two times a day (BID) | CUTANEOUS | Status: DC
  Administered 2016-12-27 – 2016-12-29 (×5): via TOPICAL
  Filled 2016-12-27: qty 10

## 2016-12-27 MED ORDER — PANTOPRAZOLE SODIUM 40 MG PO TBEC
40.0000 mg | DELAYED_RELEASE_TABLET | Freq: Every day | ORAL | Status: DC
  Administered 2016-12-28 – 2016-12-29 (×2): 40 mg via ORAL
  Filled 2016-12-27 (×2): qty 1

## 2016-12-27 MED ORDER — SODIUM CHLORIDE 0.9 % IV BOLUS (SEPSIS)
1000.0000 mL | Freq: Once | INTRAVENOUS | Status: AC
  Administered 2016-12-27: 1000 mL via INTRAVENOUS

## 2016-12-27 MED ORDER — ALBUTEROL SULFATE (2.5 MG/3ML) 0.083% IN NEBU: 2.5000 mg | INHALATION_SOLUTION | Freq: Four times a day (QID) | RESPIRATORY_TRACT | Status: DC | PRN

## 2016-12-27 MED ORDER — HYDRALAZINE HCL 50 MG PO TABS
50.0000 mg | ORAL_TABLET | Freq: Three times a day (TID) | ORAL | Status: DC
  Administered 2016-12-27 – 2016-12-29 (×7): 50 mg via ORAL
  Filled 2016-12-27: qty 2
  Filled 2016-12-27 (×7): qty 1

## 2016-12-27 MED ORDER — ACETAMINOPHEN 325 MG PO TABS: 325.0000 mg | ORAL_TABLET | Freq: Four times a day (QID) | ORAL | Status: DC | PRN

## 2016-12-27 MED ORDER — SENNOSIDES-DOCUSATE SODIUM 8.6-50 MG PO TABS: 1.0000 | ORAL_TABLET | Freq: Every evening | ORAL | Status: DC | PRN

## 2016-12-27 MED ORDER — FERROUS SULFATE 325 (65 FE) MG PO TABS
325.0000 mg | ORAL_TABLET | Freq: Three times a day (TID) | ORAL | Status: DC
  Administered 2016-12-28 – 2016-12-29 (×6): 325 mg via ORAL
  Filled 2016-12-27 (×6): qty 1

## 2016-12-27 MED ORDER — SODIUM CHLORIDE 0.9 % IV SOLN
INTRAVENOUS | Status: AC
  Administered 2016-12-27: 23:00:00 via INTRAVENOUS

## 2016-12-27 MED ORDER — DONEPEZIL HCL 10 MG PO TABS
10.0000 mg | ORAL_TABLET | Freq: Every day | ORAL | Status: DC
  Administered 2016-12-27 – 2016-12-29 (×3): 10 mg via ORAL
  Filled 2016-12-27 (×3): qty 1

## 2016-12-27 MED ORDER — SERTRALINE HCL 50 MG PO TABS
50.0000 mg | ORAL_TABLET | Freq: Every day | ORAL | Status: DC
  Administered 2016-12-28 – 2016-12-29 (×2): 50 mg via ORAL
  Filled 2016-12-27 (×2): qty 1

## 2016-12-27 MED ORDER — ONDANSETRON HCL 4 MG/2ML IJ SOLN: 4.0000 mg | Freq: Four times a day (QID) | INTRAMUSCULAR | Status: DC | PRN

## 2016-12-27 MED ORDER — BISACODYL 10 MG RE SUPP: 10.0000 mg | Freq: Every day | RECTAL | Status: DC | PRN

## 2016-12-27 MED ORDER — INSULIN ASPART 100 UNIT/ML ~~LOC~~ SOLN: 0.0000 [IU] | Freq: Every day | SUBCUTANEOUS | Status: DC

## 2016-12-27 MED ORDER — ATORVASTATIN CALCIUM 80 MG PO TABS
80.0000 mg | ORAL_TABLET | Freq: Every day | ORAL | Status: DC
  Administered 2016-12-27 – 2016-12-29 (×3): 80 mg via ORAL
  Filled 2016-12-27 (×3): qty 1

## 2016-12-27 MED ORDER — CARVEDILOL 6.25 MG PO TABS
6.2500 mg | ORAL_TABLET | Freq: Every day | ORAL | Status: DC
  Administered 2016-12-29: 6.25 mg via ORAL
  Filled 2016-12-27 (×2): qty 1

## 2016-12-27 MED ORDER — POTASSIUM CHLORIDE ER 10 MEQ PO TBCR
10.0000 meq | EXTENDED_RELEASE_TABLET | Freq: Every day | ORAL | Status: DC
Start: 2016-12-27 — End: 2016-12-27

## 2016-12-27 NOTE — ED Notes (Signed)
Meal and drink given to pt.  

## 2016-12-27 NOTE — ED Notes (Signed)
Attempted to call report to 2W, Network engineer reports receiving nurse is still in report.

## 2016-12-27 NOTE — ED Provider Notes (Signed)
[facility] EMERGENCY DEPARTMENT Provider Note   CSN: [id number] Arrival date & time: 12/27/16  1008     History   Chief Complaint Chief Complaint  Patient presents with  . Abscess  . Rash    HPI [name] is a 66 y.o. female.  HPI   Patient is a 66 year old female with a history of diabetes, hypertension, hyperlipidemia, CVA, peripheral vascular disease, and gross hematuria in the setting of a possible right renal pelvic mass 1 week ago presenting for left groin boil and periumbilical boil as well as rash of the vagina and perineum.  Patient reports that these boils and rash began approximately 1 week ago when she noted the gross hematuria.  Patient has been a symptomatic of fever, chills, new or worsening abdominal pain, chest pain, shortness of breath.  Patient reports he continues to have gross hematuria with every episode of urination.  No diarrhea.  With this gross hematuria, patient reports that she is lightheaded with walking, however this is been going on several months and is not new for her.  Patient reports occasional palpitations and shortness of breath with ambulation which is new. Patient reports he has trouble cleaning herself and is incontinent of urine.  Past Medical History:  Diagnosis Date  . Anemia, chronic disease 05/16/2008   Qualifier: Diagnosis of  By: Percival Spanish, MD, Farrel Gordon    . Bradycardia    a. Limiting BB titration.  [name] CAD (coronary artery disease)    a. NSTEMI 2007 in setting of anemia; LHC 50% LM, 60% LAD, 30% RI, occluded RCA with L-R collaterals, normal EF, treated medically; B. LHC 01/2015 prox-mid RCA 100%, ostial to prox LAD 70%, LM 50%, mid-distal circ 90%-not surgical candidate due to renal and pulmonary issues).  . Carotid artery disease ([place])    a. Duplex 12/2014: left CEA patent with elevated velocities, 60-79% RICA (stable over serial exams), 77-82% LICA stenosis (stable over serial exams), 50% LECA, elevated  velocities in bilateral subclavian arteries.  . Chronic combined systolic (congestive) and diastolic (congestive) heart failure ([place])    a. 06/2016 Echo: EF 40-45%, mod MR, mildly dil LA/RA, mod TR, PASP 53mmHg.  . CKD (chronic kidney disease), stage IV ([place])   . COPD (chronic obstructive pulmonary disease) (HCC)    home oxygen  . Dementia   . Depression   . Diabetes mellitus   . Diabetic retinopathy ([place])   . Essential hypertension   . GI AVM (gastrointestinal arteriovenous vascular malformation)    small bowel  . Hyperlipidemia   . Hypertensive heart disease with congestive heart failure ([place])   . Iron deficiency anemia    a. ? chronic GI blood loss-->plavix d/c'd 06/2016.  [name] Peripheral vascular disease ([place])    a.s /p left common iliac and external iliac artery stents. b. H/o subclavian, mesenteric, and celiac artery stenosis (see below).  . Protein-calorie malnutrition ([place]) 01/24/2015  . Pulmonary hypertension ([place])    a. 06/2016 Echo: PASP 69mmHg.  [name] Renal artery stenosis ([place])    a. Duplex 2014: no obvious evidence of hemodynamically significant stenosis >60%, bilateral intrarenal arteries exhibit absent diastolic flow. There is evidence of elevated velocities of the mid aorta. 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  . Tobacco abuse   . 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 Dieaulafoy's lesion 01/25/2012    Patient Active Problem List   Diagnosis Date Noted  . Caliectasis 12/27/2016  . Anemia 12/27/2016  . Skin  breakdown 12/27/2016  . Boil 12/27/2016  . Hematuria 12/27/2016  . Abscess of groin   . Bilateral carotid artery stenosis 08/22/2016  . Lung nodule 06/29/2016  . Acute kidney injury superimposed on chronic kidney disease ([place]) 06/23/2016  . Acute on chronic combined systolic and diastolic CHF (congestive heart failure) ([place]) 06/23/2016  . Hypokalemia 06/23/2016  . Acute esophagitis   . Angiodysplasia of duodenum   . Symptomatic anemia  06/21/2016  . Cardiac enzymes elevated   . Elevated liver enzymes   . Transaminitis   . Demand ischemia ([place])   . Coffee ground emesis   . Heme positive stool   . Dementia 03/04/2016  . CAP (community acquired pneumonia) 05/13/2015  . Indwelling catheter present on admission 04/21/2015  . Difficulty breathing   . Pulmonary infiltrates   . Staphylococcus aureus bacteremia   . Palliative care encounter   . Malnutrition of moderate degree 03/30/2015  . HCAP (healthcare-associated pneumonia) 03/29/2015  . Acute respiratory failure ([place]) 03/29/2015  . Hypomagnesemia 03/29/2015  . Low serum vitamin D 03/29/2015  . Metabolic acidosis 60/45/4098  . Coronary artery disease involving native coronary artery of native heart with angina pectoris ([place])   . Sacral ulcer ([place])   . Urinary retention   . Hypocalcemia 03/20/2015  . Lewy body dementia without behavioral disturbance   . AKI (acute kidney injury) ([place])   . Protein-calorie malnutrition ([place]) 01/24/2015  . Abnormal nuclear stress test   . Stage 4 chronic kidney disease ([place])   . Carotid artery disease ([place])   . CAD (coronary artery disease)   . Chronic diastolic CHF (congestive heart failure) ([place])   . Essential hypertension   . Former tobacco use   . Bradycardia   . UTI (urinary tract infection) 01/27/2012  . Nausea & vomiting 01/26/2012  . 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 Diet-controlled diabetes mellitus ([place]) 01/25/2012  . Iron deficiency anemia 01/25/2012  . Renal artery stenosis ([place]) 11/19/2010  . Peripheral vascular disease ([place]) 11/19/2010  . Diabetes mellitus with complication ([place]) 11/91/4782  . Hyperlipidemia 05/16/2008  . OBESITY 05/16/2008  . Anemia, chronic disease 05/16/2008  . ANXIETY DEPRESSION 05/16/2008  . History of CVA (cerebrovascular accident) 05/16/2008  . Chronic obstructive pulmonary disease ([place]) 05/16/2008  . DIVERTICULITIS, HX OF 05/16/2008    Past  Surgical History:  Procedure Laterality Date  . APPENDECTOMY    . CAROTID ENDARTERECTOMY    . CESAREAN SECTION    . CHOLECYSTECTOMY    . COLONOSCOPY WITH ESOPHAGOGASTRODUODENOSCOPY (EGD)  06/22/2016  . ILIAC ARTERY STENT     left  . SHOULDER ARTHROSCOPY    . TONSILLECTOMY      OB History    No data available       Home Medications    Prior to Admission medications   Medication Sig Start Date End Date Taking? Authorizing Provider  albuterol (PROVENTIL HFA;VENTOLIN HFA) 108 (90 Base) MCG/ACT inhaler Inhale 2 puffs into the lungs every 6 (six) hours as needed for wheezing or shortness of breath. 06/15/16  Yes Arnoldo Morale, MD  aspirin EC 81 MG tablet Take 81 mg by mouth daily.   Yes [provider]  atorvastatin (LIPITOR) 80 MG tablet TAKE 1 TABLET BY MOUTH  DAILY AT 6 PM. Patient taking differently: Take 80 mg daily at 6 PM by mouth.  08/12/16  Yes Amao, Enobong, MD  carvedilol (COREG) 6.25 MG tablet TAKE 1 TABLET BY MOUTH TWO  TIMES DAILY WITH  A MEAL 09/13/16  Yes Arnoldo Morale, MD  cefUROXime (CEFTIN) 500 MG tablet Take 1 tablet (500 mg total) by mouth 2 (two) times daily with a meal. 12/23/16  Yes Ladell Pier, MD  donepezil (ARICEPT) 10 MG tablet Take 1 tablet (10 mg total) by mouth at bedtime. 06/15/16  Yes Arnoldo Morale, MD  ferrous sulfate 325 (65 FE) MG tablet Take 1 tablet (325 mg total) by mouth 2 (two) times daily with a meal. Patient taking differently: Take 325 mg 3 (three) times daily with meals by mouth.  06/15/16  Yes Amao, Enobong, MD  fish oil-omega-3 fatty acids 1000 MG capsule Take 1 g by mouth 3 (three) times daily. Reported on 04/09/2015   Yes [provider]  furosemide (LASIX) 80 MG tablet Take 1 tablet (80 mg total) by mouth 2 (two) times daily. Patient taking differently: Take 80 mg daily by mouth.  06/15/16  Yes Amao, Enobong, MD  hydrALAZINE (APRESOLINE) 50 MG tablet TAKE 1 TABLET BY MOUTH 3  TIMES DAILY Patient taking differently: TAKE  50mg  BY MOUTH 3  TIMES DAILY 12/20/16  Yes Jegede, Olugbemiga E, MD  nitroGLYCERIN (NITROSTAT) 0.4 MG SL tablet Place 0.4 mg under the tongue every 5 (five) minutes as needed for chest pain. X 3 doses   Yes [provider]  pantoprazole (PROTONIX) 40 MG tablet TAKE 1 TABLET BY MOUTH  DAILY 11/22/16  Yes Arnoldo Morale, MD  sertraline (ZOLOFT) 50 MG tablet Take 1 tablet (50 mg total) by mouth daily. 08/12/16  Yes Arnoldo Morale, MD  acetaminophen (TYLENOL) 325 MG tablet Take 325 mg by mouth every 6 (six) hours as needed.    [provider]  potassium chloride (K-DUR) 10 MEQ tablet Take 1 tablet (10 mEq total) by mouth daily. Patient not taking: Reported on 12/27/2016 12/20/16 12/25/16  Laban Emperor, PA-C    Family History Family History  Problem Relation Age of Onset  . Hypertension Mother   . Heart attack Mother   . Hypertension Father   . Angina Father   . Cancer Sister   . Cancer Brother   . Stroke Neg Hx     Social History Social History   Tobacco Use  . Smoking status: Current Every Day Smoker    Packs/day: 1.00    Years: 36.00    Pack years: 36.00    Types: Cigarettes  . Smokeless tobacco: Never Used  Substance Use Topics  . Alcohol use: No    Alcohol/week: 0.0 oz    Frequency: Never  . Drug use: No     Allergies   Patient has no known allergies.   Review of Systems Review of Systems  Constitutional: Negative for chills and fever.  HENT: Negative for congestion, rhinorrhea and sore throat.   Eyes: Negative for visual disturbance.  Respiratory: Negative for cough, chest tightness and shortness of breath.   Cardiovascular: Negative for chest pain and palpitations.  Gastrointestinal: Negative for abdominal pain, constipation, nausea and vomiting.  Genitourinary: Negative for dysuria and flank pain.  Musculoskeletal: Negative for back pain and myalgias.  Skin: Positive for color change. Negative for rash.  Neurological: Positive for dizziness and  light-headedness. Negative for syncope and headaches.     Physical Exam Updated Vital Signs BP (!) 150/39   Pulse (!) 59   Temp (!) 97.5 F (36.4 C) (Oral)   Resp 15   SpO2 100%   Physical Exam  Constitutional: She appears well-developed and well-nourished. No distress.  HENT:  Head: Normocephalic and atraumatic.  Mouth/Throat: Oropharynx is clear and moist.  Eyes: Conjunctivae and EOM are normal. Pupils are equal, round, and reactive to light.  Neck: Normal range of motion. Neck supple.  Cardiovascular: Normal rate, regular rhythm, S1 normal and S2 normal.  Murmur heard. Blowing holosystolic murmur.  Pulmonary/Chest: Effort normal and breath sounds normal. She has no wheezes. She has no rales.  Abdominal: Soft. She exhibits no distension. There is no tenderness. There is no guarding.  Musculoskeletal: Normal range of motion. She exhibits no edema or deformity.  Lymphadenopathy:    She has no cervical adenopathy.  Neurological: She is alert.  Cranial nerves grossly intact.Patient was extremities with good coordination and symmetrically.   Skin: Skin is warm and dry. Rash noted. There is erythema.     Psychiatric: She has a normal mood and affect. Her behavior is normal. Judgment and thought content normal.  Nursing note and vitals reviewed.    ED Treatments / Results  Labs (all labs ordered are listed, but only abnormal results are displayed) Labs Reviewed  BASIC METABOLIC PANEL - Abnormal; Notable for the following components:      Result Value   Potassium 3.3 (*)    Chloride 116 (*)    CO2 10 (*)    BUN 68 (*)    Creatinine, Ser 3.81 (*)    Calcium 8.6 (*)    GFR calc non Af Amer 11 (*)    GFR calc Af Amer 13 (*)    All other components within normal limits  CBC - Abnormal; Notable for the following components:   WBC 12.0 (*)    RBC 3.69 (*)    Hemoglobin 10.5 (*)    HCT 32.1 (*)    All other components within normal limits  URINALYSIS, ROUTINE W REFLEX  MICROSCOPIC  BASIC METABOLIC PANEL  CBC  MAGNESIUM  CBG MONITORING, ED  CBG MONITORING, ED    EKG  EKG Interpretation None       Radiology No results found.  Procedures .Marland KitchenIncision and Drainage Date/Time: 12/27/2016 6:43 PM Performed by: Albesa Seen, PA-C Authorized by: Albesa Seen, PA-C   Consent:    Consent obtained:  Verbal   Consent given by:  Patient Location:    Type:  Abscess   Location:  Lower extremity   Lower extremity location:  Leg   Leg location:  L upper leg Pre-procedure details:    Skin preparation:  Betadine Anesthesia (see MAR for exact dosages):    Anesthesia method:  Local infiltration   Local anesthetic:  Lidocaine 2% WITH epi Procedure details:    Needle aspiration: no     Incision types:  Stab incision   Incision depth:  Dermal   Scalpel blade:  11   Wound management:  Probed and deloculated and irrigated with saline   Drainage:  Purulent   Drainage amount:  Moderate   Wound treatment:  Wound left open   Packing materials:  None Post-procedure details:    Patient tolerance of procedure:  Tolerated well, no immediate complications   (including critical care time)  Medications Ordered in ED Medications  albuterol (PROVENTIL) (2.5 MG/3ML) 0.083% nebulizer solution 2.5 mg (not administered)  atorvastatin (LIPITOR) tablet 80 mg (not administered)  carvedilol (COREG) tablet 6.25 mg (not administered)  cefUROXime (CEFTIN) tablet 500 mg (not administered)  donepezil (ARICEPT) tablet 10 mg (not administered)  ferrous sulfate tablet 325 mg (not administered)  hydrALAZINE (APRESOLINE) tablet 50 mg (not administered)  pantoprazole (  PROTONIX) EC tablet 40 mg (not administered)  sertraline (ZOLOFT) tablet 50 mg (not administered)  0.9 %  sodium chloride infusion (not administered)  acetaminophen (TYLENOL) tablet 650 mg (not administered)    Or  acetaminophen (TYLENOL) suppository 650 mg (not administered)  ondansetron (ZOFRAN) tablet  4 mg (not administered)    Or  ondansetron (ZOFRAN) injection 4 mg (not administered)  bisacodyl (DULCOLAX) suppository 10 mg (not administered)  senna-docusate (Senokot-S) tablet 1 tablet (not administered)  insulin aspart (novoLOG) injection 0-9 Units (0 Units Subcutaneous Not Given 12/27/16 1706)  insulin aspart (novoLOG) injection 0-5 Units (not administered)  potassium chloride SA (K-DUR,KLOR-CON) CR tablet 40 mEq (not administered)  clotrimazole (LOTRIMIN) 1 % topical solution (not administered)  lidocaine-EPINEPHrine (XYLOCAINE W/EPI) 2 %-1:200000 (PF) injection 10 mL (10 mLs Infiltration Given by Other 12/27/16 1811)  sodium chloride 0.9 % bolus 1,000 mL (1,000 mLs Intravenous New Bag/Given 12/27/16 1615)     Initial Impression / Assessment and Plan / ED Course  I have reviewed the triage vital signs and the nursing notes.  Pertinent labs & imaging results that were available during my care of the patient were reviewed by me and considered in my medical decision making (see chart for details).  Clinical Course as of Dec 28 1842  Tue Dec 27, 2016  1625 Spoke with Santiago Glad, NP of [facility] to admit for AKI and anemia.  [AM]    Clinical Course User Index [AM] Albesa Seen, PA-C    Final Clinical Impressions(s) / ED Diagnoses   Final diagnoses:  Abscess of groin  Hematuria, unspecified type  AKI (acute kidney injury) ([place])  Patient is well-appearing, afebrile, in no acute distress.  There appears to be a small abscess of the left groin as well as candidal dermatitis likely secondary to poor perineal hygiene and incontinence.  There is noted to be stool unclean on examination in the perineum.  Will open up the groin abscess.  The periumbilical abscess has already been fully drained spontaneously.  On routine lab assessment, patient creatinine noted to have jumped in 1 week from 2.17-3.81 today.  BUN 68 today.  Additionally, in the setting of gross hematuria, patient's  hemoglobin was 30.11-week ago and 10 today.  This is a significant drop in 1 week, and patient is mildly symptomatic.   Patient and her husband noted that they are not sure how they are supposed to follow-up for the gross hematuria.  They thought that the urology office was to call them.  No follow-up set up at present.  Due to dropping hemoglobin and AK I, will proceed to consult [facility] for admission.  ED Discharge Orders    None         Tamala Julian 12/27/16 2020    Deno Etienne, DO 12/28/16 1503

## 2016-12-27 NOTE — ED Notes (Signed)
CM was at bedside 

## 2016-12-27 NOTE — Care Management Obs Status (Signed)
[facility] NOTIFICATION   Patient Details  Name: [name] MRN: [id number] Date of Birth: Oct 17, 1950   Medicare Observation Status Notification Given:  Ernesta Amble, RN 12/27/2016, 5:29 PM

## 2016-12-27 NOTE — ED Notes (Signed)
Pt is resting and appears comfortable with family at bedside.  Reason for delay explained to them.  Warm blanket given to pt.  Pt denies any complaints at this time.

## 2016-12-27 NOTE — ED Triage Notes (Signed)
PT is here with left inner groin/vaginal pain abscess and she is having a lot of pain there.  Pt has a rash to perineal and buttocks area.  She was put on a pill for her urine and she is still taking them.

## 2016-12-27 NOTE — H&P (Signed)
History and Physical    [name] NLZ:[id number] DOB: Jan 12, 1951 DOA: 12/27/2016  PCP: Arnoldo Morale, MD Patient coming from: home  Chief Complaint: abscess and rash  HPI: [name] is a 66 y.o. female with medical history significant for diabetes, hypertension, hyperlipidemia, CVA, peripheral vascular disease, dementia, iron deficiency anemia and recent hematuria presents to the emergency department with the chief complaint rash and left groin boil. Initial evaluation in the emergency department reveals acute kidney injury superimposed on chronic kidney disease symptomatic anemia. [facility] are asked to admit  Information is obtained from the patient and her husband who is at the bedside noting and information from the patient is unreliable due to dementia. Approximately week ago patient was seen for hematuria. CT was done at that time indicating mass with blood in the right renal collecting system. Urology was consulted at that time and they recommended outpatient follow-up. Ration states she does not have an appointment at this time. Patient and husband reports she continues to have hematuria intermittently. She denies pain she denies passing any clots she does report bright red blood or urine. Associated symptoms include some worsening dizziness with position change mild shortness of breath. She denies chest pain palpitations headache. Husband states the patient has had a decreased oral intake. He states that she lies in bed or sits in the chair "most of the time". He's also noted some generalized weakness but denies any recent falls. She was also started on antibiotic last week for urinary tract infection. Today they come to the emergency Department chief complaint of abscess in the left groin as well as the perineum rash. She denies abdominal pain nausea vomiting diarrhea constipation melena bright red blood per rectum. She denies any lower extremity edema orthopnea fever chills  recent travel or sick contacts  ED Course: Emergency department she's afebrile hemodynamically stable and not hypoxic.  Review of Systems: As per HPI otherwise all other systems reviewed and are negative.   Ambulatory Status: She ambulates with assistance with an unsteady gait.  Past Medical History:  Diagnosis Date  . Anemia, chronic disease 05/16/2008   Qualifier: Diagnosis of  By: Percival Spanish, MD, Farrel Gordon    . Bradycardia    a. Limiting BB titration.  [name] CAD (coronary artery disease)    a. NSTEMI 2007 in setting of anemia; LHC 50% LM, 60% LAD, 30% RI, occluded RCA with L-R collaterals, normal EF, treated medically; B. LHC 01/2015 prox-mid RCA 100%, ostial to prox LAD 70%, LM 50%, mid-distal circ 90%-not surgical candidate due to renal and pulmonary issues).  . Carotid artery disease ([place])    a. Duplex 12/2014: left CEA patent with elevated velocities, 60-79% RICA (stable over serial exams), 90-24% LICA stenosis (stable over serial exams), 50% LECA, elevated velocities in bilateral subclavian arteries.  . Chronic combined systolic (congestive) and diastolic (congestive) heart failure ([place])    a. 06/2016 Echo: EF 40-45%, mod MR, mildly dil LA/RA, mod TR, PASP 63mmHg.  . CKD (chronic kidney disease), stage IV ([place])   . COPD (chronic obstructive pulmonary disease) (H[facility])    home oxygen  . Dementia   . Depression   . Diabetes mellitus   . Diabetic retinopathy ([place])   . Essential hypertension   . GI AVM (gastrointestinal arteriovenous vascular malformation)    small bowel  . Hyperlipidemia   . Hypertensive heart disease with congestive heart failure ([place])   . Iron deficiency anemia    a. ? chronic GI blood loss-->plavix d/c'd  06/2016.  [name] Peripheral vascular disease ([place])    a.s /p left common iliac and external iliac artery stents. b. H/o subclavian, mesenteric, and celiac artery stenosis (see below).  . Protein-calorie malnutrition ([place]) 01/24/2015  . Pulmonary hypertension ([place])    a.  06/2016 Echo: PASP 64mmHg.  [name] Renal artery stenosis ([place])    a. Duplex 2014: no obvious evidence of hemodynamically significant stenosis >60%, bilateral intrarenal arteries exhibit absent diastolic flow. There is evidence of elevated velocities of the mid aorta. 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  . Tobacco abuse   . 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 APPENDECTOMY    . CAROTID ENDARTERECTOMY    . CESAREAN SECTION    . CHOLECYSTECTOMY    . COLONOSCOPY WITH ESOPHAGOGASTRODUODENOSCOPY (EGD)  06/22/2016  . ILIAC ARTERY STENT     left  . SHOULDER ARTHROSCOPY    . TONSILLECTOMY      Social History   Socioeconomic History  . Marital status: Married    Spouse name: Not on file  . Number of children: Not on file  . Years of education: Not on file  . Highest education level: Not on file  Social Needs  . Financial resource strain: Not on file  . Food insecurity - worry: Not on file  . Food insecurity - inability: Not on file  . Transportation needs - medical: Not on file  . Transportation needs - non-medical: Not on file  Occupational History  . Not on file  Tobacco Use  . Smoking status: Current Every Day Smoker    Packs/day: 1.00    Years: 36.00    Pack years: 36.00    Types: Cigarettes  . Smokeless tobacco: Never Used  Substance and Sexual Activity  . Alcohol use: No    Alcohol/week: 0.0 oz    Frequency: Never  . Drug use: No  . Sexual activity: No  Other Topics Concern  . Not on file  Social History Narrative  . Not on file    No Known Allergies  Family History  Problem Relation Age of Onset  . Hypertension Mother   . Heart attack Mother   . Hypertension Father   . Angina Father   . Cancer Sister   . Cancer Brother   . Stroke Neg Hx     Prior to Admission medications   Medication Sig Start Date End Date Taking? Authorizing Provider    albuterol (PROVENTIL HFA;VENTOLIN HFA) 108 (90 Base) MCG/ACT inhaler Inhale 2 puffs into the lungs every 6 (six) hours as needed for wheezing or shortness of breath. 06/15/16  Yes Arnoldo Morale, MD  aspirin EC 81 MG tablet Take 81 mg by mouth daily.   Yes [provider]  atorvastatin (LIPITOR) 80 MG tablet TAKE 1 TABLET BY MOUTH  DAILY AT 6 PM. Patient taking differently: Take 80 mg daily at 6 PM by mouth.  08/12/16  Yes Amao, Enobong, MD  carvedilol (COREG) 6.25 MG tablet TAKE 1 TABLET BY MOUTH TWO  TIMES DAILY WITH A MEAL 09/13/16  Yes Arnoldo Morale, MD  cefUROXime (CEFTIN) 500 MG tablet Take 1 tablet (500 mg total) by mouth 2 (two) times daily with a meal. 12/23/16  Yes Ladell Pier, MD  donepezil (ARICEPT) 10 MG tablet Take 1 tablet (10 mg total) by mouth at bedtime. 06/15/16  Yes Arnoldo Morale, MD  ferrous sulfate 325 (  65 FE) MG tablet Take 1 tablet (325 mg total) by mouth 2 (two) times daily with a meal. Patient taking differently: Take 325 mg 3 (three) times daily with meals by mouth.  06/15/16  Yes Amao, Enobong, MD  fish oil-omega-3 fatty acids 1000 MG capsule Take 1 g by mouth 3 (three) times daily. Reported on 04/09/2015   Yes [provider]  furosemide (LASIX) 80 MG tablet Take 1 tablet (80 mg total) by mouth 2 (two) times daily. Patient taking differently: Take 80 mg daily by mouth.  06/15/16  Yes Amao, Enobong, MD  hydrALAZINE (APRESOLINE) 50 MG tablet TAKE 1 TABLET BY MOUTH 3  TIMES DAILY Patient taking differently: TAKE 50mg  BY MOUTH 3  TIMES DAILY 12/20/16  Yes Jegede, Olugbemiga E, MD  nitroGLYCERIN (NITROSTAT) 0.4 MG SL tablet Place 0.4 mg under the tongue every 5 (five) minutes as needed for chest pain. X 3 doses   Yes [provider]  pantoprazole (PROTONIX) 40 MG tablet TAKE 1 TABLET BY MOUTH  DAILY 11/22/16  Yes Arnoldo Morale, MD  sertraline (ZOLOFT) 50 MG tablet Take 1 tablet (50 mg total) by mouth daily. 08/12/16  Yes Arnoldo Morale, MD   acetaminophen (TYLENOL) 325 MG tablet Take 325 mg by mouth every 6 (six) hours as needed.    [provider]  potassium chloride (K-DUR) 10 MEQ tablet Take 1 tablet (10 mEq total) by mouth daily. Patient not taking: Reported on 12/27/2016 12/20/16 12/25/16  Laban Emperor, PA-C    Physical Exam: Vitals:   12/27/16 1530 12/27/16 1545 12/27/16 1600 12/27/16 1615  BP: (!) 181/40 (!) 169/42 (!) 138/37 (!) 150/39  Pulse: (!) 58 61 (!) 59 (!) 59  Resp: 15 11 15 15   Temp:      TempSrc:      SpO2: 100% 100% 100% 100%     General:  Appears thin and frail chronically ill Eyes:  PERRL, EOMI, normal lids, iris ENT:  grossly normal hearing, lips & tongue, mucous membranes of her mouth are pink but dry very poor dentition Neck:  no LAD, masses or thyromegaly Cardiovascular:  RRR, no m/r/g. No LE edema. Pedal pulses present and palpable Respiratory:  Normal effort breath sounds slightly coarse mild end expiratory wheezing and hear no crackles no rhonchi Abdomen:  soft, ntnd, NABS Skin:  Left groin with erythema dime size abscess, small amount drainage no odor. Perineum with erythema extending to thighs. Tenderness to touch Musculoskeletal:  grossly normal tone BUE/BLE, good ROM, no bony abnormality Psychiatric:  grossly normal mood and affect, speech fluent and appropriate, AOx3 Neurologic:  Alert oriented to self and place only follows commands bilateral grip 5 out of 5 obvious memory defecits  Labs on Admission: I have personally reviewed following labs and imaging studies  CBC: Recent Labs  Lab 12/20/16 1900 12/27/16 1054  WBC 14.3* 12.0*  HGB 13.1 10.5*  HCT 38.8 32.1*  MCV 87.0 87.0  PLT 193 956   Basic Metabolic Panel: Recent Labs  Lab 12/20/16 1900 12/27/16 1054  NA 138 138  K 2.9* 3.3*  CL 112* 116*  CO2 19* 10*  GLUCOSE 110* 93  BUN 46* 68*  CREATININE 2.17* 3.81*  CALCIUM 8.6* 8.6*   GFR: Estimated Creatinine Clearance: 10.8 mL/min (A) (by C-G formula  based on SCr of 3.81 mg/dL (H)). Liver Function Tests: Recent Labs  Lab 12/20/16 1900  AST 19  ALT 10*  ALKPHOS 80  BILITOT 0.6  PROT 6.0*  ALBUMIN 2.7*  No results for input(s): LIPASE, AMYLASE in the last 168 hours. No results for input(s): AMMONIA in the last 168 hours. Coagulation Profile: Recent Labs  Lab 12/20/16 1900  INR 1.10   Cardiac Enzymes: No results for input(s): CKTOTAL, CKMB, CKMBINDEX, TROPONINI in the last 168 hours. BNP (last 3 results) No results for input(s): PROBNP in the last 8760 hours. HbA1C: No results for input(s): HGBA1C in the last 72 hours. CBG: Recent Labs  Lab 12/27/16 1057 12/27/16 1606  GLUCAP 90 82   Lipid Profile: No results for input(s): CHOL, HDL, LDLCALC, TRIG, CHOLHDL, LDLDIRECT in the last 72 hours. Thyroid Function Tests: No results for input(s): TSH, T4TOTAL, FREET4, T3FREE, THYROIDAB in the last 72 hours. Anemia Panel: No results for input(s): VITAMINB12, FOLATE, FERRITIN, TIBC, IRON, RETICCTPCT in the last 72 hours. Urine analysis:    Component Value Date/Time   COLORURINE RED (A) 12/20/2016 1713   APPEARANCEUR TURBID (A) 12/20/2016 1713   LABSPEC  12/20/2016 1713    TEST NOT REPORTED DUE TO COLOR INTERFERENCE OF URINE PIGMENT   PHURINE  12/20/2016 1713    TEST NOT REPORTED DUE TO COLOR INTERFERENCE OF URINE PIGMENT   GLUCOSEU (A) 12/20/2016 1713    TEST NOT REPORTED DUE TO COLOR INTERFERENCE OF URINE PIGMENT   HGBUR (A) 12/20/2016 1713    TEST NOT REPORTED DUE TO COLOR INTERFERENCE OF URINE PIGMENT   BILIRUBINUR (A) 12/20/2016 1713    TEST NOT REPORTED DUE TO COLOR INTERFERENCE OF URINE PIGMENT   BILIRUBINUR negative 08/11/2015 1513   KETONESUR (A) 12/20/2016 1713    TEST NOT REPORTED DUE TO COLOR INTERFERENCE OF URINE PIGMENT   PROTEINUR (A) 12/20/2016 1713    TEST NOT REPORTED DUE TO COLOR INTERFERENCE OF URINE PIGMENT   UROBILINOGEN 0.2 08/11/2015 1513   UROBILINOGEN 0.2 01/08/2015 1215   NITRITE (A)  12/20/2016 1713    TEST NOT REPORTED DUE TO COLOR INTERFERENCE OF URINE PIGMENT   LEUKOCYTESUR (A) 12/20/2016 1713    TEST NOT REPORTED DUE TO COLOR INTERFERENCE OF URINE PIGMENT    Creatinine Clearance: Estimated Creatinine Clearance: 10.8 mL/min (A) (by C-G formula based on SCr of 3.81 mg/dL (H)).  Sepsis Labs: @LABRCNTIP (procalcitonin:4,lacticidven:4) ) Recent Results (from the past 240 hour(s))  Urine culture     Status: Abnormal   Collection Time: 12/20/16  5:13 PM  Result Value Ref Range Status   Specimen Description URINE, RANDOM  Final   Special Requests NONE  Final   Culture >=100,000 COLONIES/mL ESCHERICHIA COLI (A)  Final   Report Status 12/23/2016 FINAL  Final   Organism ID, Bacteria ESCHERICHIA COLI (A)  Final      Susceptibility   Escherichia coli - MIC*    AMPICILLIN <=2 SENSITIVE Sensitive     CEFAZOLIN <=4 SENSITIVE Sensitive     CEFTRIAXONE <=1 SENSITIVE Sensitive     CIPROFLOXACIN >=4 RESISTANT Resistant     GENTAMICIN >=16 RESISTANT Resistant     IMIPENEM <=0.25 SENSITIVE Sensitive     NITROFURANTOIN <=16 SENSITIVE Sensitive     TRIMETH/SULFA >=320 RESISTANT Resistant     AMPICILLIN/SULBACTAM <=2 SENSITIVE Sensitive     PIP/TAZO <=4 SENSITIVE Sensitive     Extended ESBL NEGATIVE Sensitive     * >=100,000 COLONIES/mL ESCHERICHIA COLI     Radiological Exams on Admission: No results found.  EKG:   Assessment/Plan Principal Problem:   Acute kidney injury superimposed on chronic kidney disease ([place]) Active Problems:   Diabetes (HCC)   OBESITY   Anemia,  chronic disease   ANXIETY DEPRESSION   History of CVA (cerebrovascular accident)   Chronic obstructive pulmonary disease (HCC)   Renal artery stenosis (H[facility])   UTI (urinary tract infection)   Carotid artery disease (H[facility])   CAD (coronary artery disease)   Chronic diastolic CHF (congestive heart failure) ([place])   Essential hypertension   Lewy body dementia without behavioral disturbance    Symptomatic anemia   Hypokalemia   Lung nodule   Caliectasis   Anemia   Skin breakdown   Boil   Hematuria   #1 acute kidney injury superimposed on chronic kidney disease stage III. Likely related to decreased oral intake in the setting of Lasix and hematuria concerning for mass. Creatinine 2.8 on admission. Chart review indicates recent range 2.1-3.1 -Admit -Hold nephrotoxins -Monitor urine output -Gentle IV fluids -Check in the morning  #2. Symptomatic anemia in the setting of chronic iron deficiency anemia and hematuria. Patient with hematuria for the past week. Hemoglobin on admission 10.5. Chart review indicates that hemoglobin was 13.1 one week ago and 11.8 5 months ago. -monitor her urine output/hematuria -serial cbc -anemia panel -continue iron  #3. Hematuria. Patient with a history of renal artery stenosis. Recent CT concerning for mass. Patient continues with intermittent hematuria. No clots no pain. Has not been able to make follow-up appointment with urology. Evaluated last week [place]. Chart indicates urology office to call patient. -Monitor hemoglobin -If stable discharge to home with outpatient urology appointment. Of note recommend case management make the appointment for patient before going home. Patient with dementia and husband needs assistance. Follow-up in the past -If hemoglobin continues to trend down consider urology inpatient consult  #4. Urinary tract infection. Start on antibiotic 3 days ago as UTI diagnosed last week -Follow urinalysis  -Continue antibiotic  #5. Hypokalemia. Likely related decreased oral intake in the setting of Lasix. Potassium level III.3. -Replete -Recheck  #6. Chronic diastolic heart failure. Recent echo with a EF of 40%. Currently compensated. Home medications include Coreg, Lasix -Holding Lasix for now -Gentle IV fluids -Daily weights -Intake and output -Continue Coreg  #7. Diabetes. Diet controlled. -Obtain a  hemoglobin A1c -Sliding scale for optimal control  #8. Dementia. 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 Stable at baseline according to the husband. -Continue home meds  #9. Recent urinary tract infection. Has not completed antibiotics. She is afebrile hemodynamically stable. WBCs trending down. -continue antibiotic  #10.skin breakdown/rash/boil. Patient immobile and concern for yeast rash -lotrimin -mobilize -skin care protocol   DVT prophylaxis: scd  Code Status: full  Family Communication: husband at bedside  Disposition Plan: home when ready hopefully 24 hours  Consults called: none  Admission status: obs    [name] MD [facility]  If 7PM-7AM, please contact night-coverage [email or web address] Password [email or web address]  12/27/2016, 5:09 PM

## 2016-12-27 NOTE — ED Notes (Signed)
[facility] at bedside

## 2016-12-28 ENCOUNTER — Encounter (HOSPITAL_COMMUNITY): Payer: Self-pay | Admitting: General Practice

## 2016-12-28 DIAGNOSIS — N179 Acute kidney failure, unspecified: Secondary | ICD-10-CM | POA: Diagnosis not present

## 2016-12-28 DIAGNOSIS — N189 Chronic kidney disease, unspecified: Secondary | ICD-10-CM

## 2016-12-28 LAB — CBC
HCT: 29.4 % — ABNORMAL LOW (ref 36.0–46.0)
Hemoglobin: 9.7 g/dL — ABNORMAL LOW (ref 12.0–15.0)
MCH: 28.4 pg (ref 26.0–34.0)
MCHC: 33 g/dL (ref 30.0–36.0)
MCV: 86 fL (ref 78.0–100.0)
PLATELETS: 269 10*3/uL (ref 150–400)
RBC: 3.42 MIL/uL — ABNORMAL LOW (ref 3.87–5.11)
RDW: 14.8 % (ref 11.5–15.5)
WBC: 8.8 10*3/uL (ref 4.0–10.5)

## 2016-12-28 LAB — GLUCOSE, CAPILLARY
GLUCOSE-CAPILLARY: 89 mg/dL (ref 65–99)
Glucose-Capillary: 60 mg/dL — ABNORMAL LOW (ref 65–99)
Glucose-Capillary: 67 mg/dL (ref 65–99)
Glucose-Capillary: 78 mg/dL (ref 65–99)
Glucose-Capillary: 95 mg/dL (ref 65–99)

## 2016-12-28 LAB — BASIC METABOLIC PANEL
Anion gap: 9 (ref 5–15)
BUN: 67 mg/dL — AB (ref 6–20)
CHLORIDE: 120 mmol/L — AB (ref 101–111)
CO2: 11 mmol/L — ABNORMAL LOW (ref 22–32)
CREATININE: 3.59 mg/dL — AB (ref 0.44–1.00)
Calcium: 8.2 mg/dL — ABNORMAL LOW (ref 8.9–10.3)
GFR calc Af Amer: 14 mL/min — ABNORMAL LOW (ref >60)
GFR calc non Af Amer: 12 mL/min — ABNORMAL LOW (ref >60)
GLUCOSE: 59 mg/dL — AB (ref 65–99)
Potassium: 3.1 mmol/L — ABNORMAL LOW (ref 3.5–5.1)
SODIUM: 140 mmol/L (ref 135–145)

## 2016-12-28 MED ORDER — CLINDAMYCIN HCL 300 MG PO CAPS
300.0000 mg | ORAL_CAPSULE | Freq: Three times a day (TID) | ORAL | Status: DC
  Administered 2016-12-28 – 2016-12-29 (×5): 300 mg via ORAL
  Filled 2016-12-28 (×5): qty 1

## 2016-12-28 MED ORDER — POTASSIUM CHLORIDE CRYS ER 20 MEQ PO TBCR
40.0000 meq | EXTENDED_RELEASE_TABLET | Freq: Three times a day (TID) | ORAL | Status: DC
  Administered 2016-12-28 – 2016-12-29 (×6): 40 meq via ORAL
  Filled 2016-12-28 (×6): qty 2

## 2016-12-28 MED ORDER — ENSURE ENLIVE PO LIQD
237.0000 mL | Freq: Two times a day (BID) | ORAL | Status: DC
  Administered 2016-12-28 – 2016-12-29 (×3): 237 mL via ORAL

## 2016-12-28 MED ORDER — CEFUROXIME AXETIL 500 MG PO TABS: 500.0000 mg | ORAL_TABLET | Freq: Every day | ORAL | Status: DC

## 2016-12-28 NOTE — Progress Notes (Signed)
Pt. transported from ER via stretcher to 2W-09; alert and oriented x4; personal appearance unkept/unclean esp. skin, nails; skin under breast very red and moist excoriated looking esp. (R) breast; moisture and redness also perineal and buttocks areas. When asked pt. if she does her own ADL's, she stated that she bathe and dress herself and, then later pt. stated that she has an aide that comes 3-4 times a week and help with personal care, and she also said that her husband help bathe her. Poor appetite at home; and states that daughter comes and cook for her some. Pt. oriented to call button and room.

## 2016-12-28 NOTE — Progress Notes (Signed)
PROGRESS NOTE    [name]  JKK:[id number] DOB: 1950/08/11 DOA: 12/27/2016 PCP: Arnoldo Morale, MD   Specialists:     Brief Narrative:  66 year old female Diabetes mellitus type 2 with nephropathy  Chronic kidney disease stage IV Hypertension Hyperlipidemia Peripheral vascular disease previous MSSA bacteremia 1/4 bottles April 2018 status post 28 days treatment also treated for multifocal pneumonia Prior CVA on Plavix COPD  09/03/2004 Peripheral vascular disease status post left common iliac and external iliac artery stents Cardiomyopathy EF 25% not a candidate for PCI, CABG 01/2015  Multivessel disease as per note 01/28/2015  NUC 7.10.13 --69% EF Iron deficiency anemia Previous admission 06/24/2016 hemoglobin of 5 EGD = hiatal hernia, reflux, erosive esophagitis and single nonbleeding angiectasia and duodenum treated with APC  Also had a GI bleed on admission 03/26/2015--EGD was noncontributory at the time  Prior subacute bleed 10/20/2003--capsule is negative-prior colonoscopy 2003 [-]  Recent emergency room visit 12/20/2016 with hematuria-found to have mass in the right renal collecting system and 7 mm stone outside and was told to follow-up with urologist at that time  Admitted with acute kidney injury in setting of Lasix and hematuria creatinine 2.8 on admission prior creatinine between 2 and 3 Started on antibiotics last admission    Assessment & Plan:   Principal Problem:   Acute kidney injury superimposed on chronic kidney disease ([place]) Active Problems:   Diabetes mellitus with complication ([place])   OBESITY   Anemia, chronic disease   ANXIETY DEPRESSION   History of CVA (cerebrovascular accident)   Chronic obstructive pulmonary disease ([place])   Renal artery stenosis (H[facility])   UTI (urinary tract infection)   Carotid artery disease ([place])   CAD (coronary artery disease)   Chronic diastolic CHF (congestive heart failure) ([place])   Essential hypertension   Lewy body  dementia without behavioral disturbance   Symptomatic anemia   Hypokalemia   Lung nodule   Caliectasis   Anemia   Skin breakdown   Boil   Hematuria   Skin rash and boil with I&D done in the emergency room 11/6 Area more consistent with fungal intertrigo  As the area is still having some purulence will place on clindamycin for 5 days and hope that this clears up We will pack the area of the wound in the groin with iodoform Will continue with Lotrimin cream and observe  Hematuria This is been going on since 12/20/2016 and patient was found to have a right renal mass with a 7 mm stone on CT scan and was scheduled for follow-up with urologist Dr. Junious Silk as an outpatient for further imaging--transport will need to be set up for the same  Hemoglobin has dropped slightly from 13-10-9 and repeat labs in a.m.  possible urinary tract infection  Holding at this time antibiotics for this indication as they were prescribed over week ago we will monitor for any further issue  Hypokalemia potassium still in the 3 range  So we will increase twice daily 40 mg 2 3 times daily 40 mg K-Dur  Diabetes mellitus type 2  Await A1c  Blood sugars are actually low and she is eating only about 50% of meals  Family tells me that she was taken off of all anti-glycemic agents about 2 years ago because her sugars were better and we will discontinue sliding   Moderate dementia  Not severe but does need reorientation, husband gives meds to her, daughter organizes Will need frequent reorientation  ContinueAricept 10 at bedtime and sertraline for  behavioral disturbances noting that sertraline does not decrease mortality and in fact increased  Chronic diastolic heart failure Previous cardiomyopathy EF 25% not candidate for PCI multivessel disease  Continue Coreg 6.25 at bedtime, hydralazine 50 3 times daily   Previous multiple GI bleeds with erosive esophagitis and single nonbleeding ectasia of the  Last  admission 06/24/2016 for the same  Does not need any further transfusion at this time  Given recent hematuria as well as prior GI bleed would hold aspirin at this time until seen by urologist  Sacral decubitus  Daughter has been dressing the area INnatal cleft about 2 cm--would place barrier dressing Previously has been treated with Santyl to the area at home   Have encouraged patient to get up and move around we will get therapy to see the patient in consult  Peripheral vascular disease status post left common iliac and external iliac artery stents as well as prior CVA on Plavix  For now would hold aspirin 81 mg, not currently on Plavix  SCD Full code Unclear disposition at this time   Consultants:     Procedures:     Antimicrobials:   Ceftin 500 bid    Subjective: Doing fair without any distress at present has some discomfort in her groin but otherwise is fine  No hematuria no dark stool no tarry stool no fever no chills   Objective: Vitals:   12/27/16 1900 12/27/16 1915 12/27/16 2057 12/28/16 0458  BP: (!) 141/41 (!) 129/40 (!) 157/88 (!) 154/52  Pulse: (!) 57 (!) 54 65 80  Resp: 12 11 14 15   Temp:   97.6 F (36.4 C) 97.8 F (36.6 C)  TempSrc:   Oral Oral  SpO2: 100% 98% 100% 98%  Weight:   48.6 kg (107 lb 2.3 oz)   Height:   4\' 11"  (1.499 m)     Intake/Output Summary (Last 24 hours) at 12/28/2016 1245 Last data filed at 12/28/2016 0600 Gross per 24 hour  Intake 1600 ml  Output 0 ml  Net 1600 ml   Filed Weights   12/27/16 2057  Weight: 48.6 kg (107 lb 2.3 oz)    Examination:  alert pleasant oriented no acute distress No pallor no icterus Chest is clinically clear without added sound Has 2 cm sacral decubitus and natal cleft Also note wound in   Groin  Mild purulence was expressed at the bedside with good result would continue pantoprazole 40 daily   Data Reviewed: I have personally reviewed following labs and imaging studies  CBC: Recent  Labs  Lab 12/27/16 1054 12/28/16 0231  WBC 12.0* 8.8  HGB 10.5* 9.7*  HCT 32.1* 29.4*  MCV 87.0 86.0  PLT 279 809   Basic Metabolic Panel: Recent Labs  Lab 12/27/16 1054 12/27/16 1725 12/28/16 0231  NA 138  --  140  K 3.3*  --  3.1*  CL 116*  --  120*  CO2 10*  --  11*  GLUCOSE 93  --  59*  BUN 68*  --  67*  CREATININE 3.81*  --  3.59*  CALCIUM 8.6*  --  8.2*  MG  --  1.8  --    GFR: Estimated Creatinine Clearance: 10.5 mL/min (A) (by C-G formula based on SCr of 3.59 mg/dL (H)). Liver Function Tests: No results for input(s): AST, ALT, ALKPHOS, BILITOT, PROT, ALBUMIN in the last 168 hours. No results for input(s): LIPASE, AMYLASE in the last 168 hours. No results for input(s): AMMONIA in the last  168 hours. Coagulation Profile: No results for input(s): INR, PROTIME in the last 168 hours. Cardiac Enzymes: No results for input(s): CKTOTAL, CKMB, CKMBINDEX, TROPONINI in the last 168 hours. BNP (last 3 results) No results for input(s): PROBNP in the last 8760 hours. HbA1C: No results for input(s): HGBA1C in the last 72 hours. CBG: Recent Labs  Lab 12/27/16 1057 12/27/16 1606 12/27/16 2048  GLUCAP 90 82 96   Lipid Profile: No results for input(s): CHOL, HDL, LDLCALC, TRIG, CHOLHDL, LDLDIRECT in the last 72 hours. Thyroid Function Tests: No results for input(s): TSH, T4TOTAL, FREET4, T3FREE, THYROIDAB in the last 72 hours. Anemia Panel: No results for input(s): VITAMINB12, FOLATE, FERRITIN, TIBC, IRON, RETICCTPCT in the last 72 hours. Urine analysis:    Component Value Date/Time   COLORURINE RED (A) 12/20/2016 1713   APPEARANCEUR TURBID (A) 12/20/2016 1713   LABSPEC  12/20/2016 1713    TEST NOT REPORTED DUE TO COLOR INTERFERENCE OF URINE PIGMENT   PHURINE  12/20/2016 1713    TEST NOT REPORTED DUE TO COLOR INTERFERENCE OF URINE PIGMENT   GLUCOSEU (A) 12/20/2016 1713    TEST NOT REPORTED DUE TO COLOR INTERFERENCE OF URINE PIGMENT   HGBUR (A) 12/20/2016 1713     TEST NOT REPORTED DUE TO COLOR INTERFERENCE OF URINE PIGMENT   BILIRUBINUR (A) 12/20/2016 1713    TEST NOT REPORTED DUE TO COLOR INTERFERENCE OF URINE PIGMENT   BILIRUBINUR negative 08/11/2015 1513   KETONESUR (A) 12/20/2016 1713    TEST NOT REPORTED DUE TO COLOR INTERFERENCE OF URINE PIGMENT   PROTEINUR (A) 12/20/2016 1713    TEST NOT REPORTED DUE TO COLOR INTERFERENCE OF URINE PIGMENT   UROBILINOGEN 0.2 08/11/2015 1513   UROBILINOGEN 0.2 01/08/2015 1215   NITRITE (A) 12/20/2016 1713    TEST NOT REPORTED DUE TO COLOR INTERFERENCE OF URINE PIGMENT   LEUKOCYTESUR (A) 12/20/2016 1713    TEST NOT REPORTED DUE TO COLOR INTERFERENCE OF URINE PIGMENT     Radiology Studies: Reviewed images personally in health database    Scheduled Meds: . atorvastatin  80 mg Oral q1800  . carvedilol  6.25 mg Oral QHS  . cefUROXime  500 mg Oral BID WC  . clotrimazole   Topical BID  . donepezil  10 mg Oral QHS  . ferrous sulfate  325 mg Oral TID WC  . hydrALAZINE  50 mg Oral TID  . insulin aspart  0-5 Units Subcutaneous QHS  . insulin aspart  0-9 Units Subcutaneous TID WC  . pantoprazole  40 mg Oral Daily  . sertraline  50 mg Oral Daily   Continuous Infusions: . sodium chloride 75 mL/hr at 12/27/16 2306     LOS: 0 days    Time spent: [place], MD [facility]   If 7PM-7AM, please contact night-coverage [email or web address] Password [email or web address] 12/28/2016, 8:08 AM

## 2016-12-28 NOTE — Plan of Care (Signed)
POC and orders reviewed with pt.

## 2016-12-28 NOTE — Evaluation (Signed)
Physical Therapy Evaluation Patient Details Name: [name] MRN: [id number] DOB: May 12, 1950 Today's Date: 12/28/2016   History of Present Illness  Pt is a 66 y/o female admitted secondary to a L groin abscess/rash. Pt also found to have an AKI. PMH including but not limited to dementia, DM, HTN, HLD, CVA, PVD, CAD s/p NSTEMI in 2007, CKD and COPD.  Clinical Impression  Pt presented supine in bed with HOB elevated, awake and willing to participate in therapy session. Prior to admission, pt reported that she ambulated with use of SPC at all times and performed ADLs independently. However, pt with cognitive deficits at baseline with no family/caregivers present to provide reliable information. Pt found in bed lying in stool upon PT arrival. Pt tolerated standing with bilateral UE supports on RW with total A for pericare. Pt's RN was notified. Pt able to ambulate a short distance (~20') in room with RW and min guard for safety. Pt would continue to benefit from skilled physical therapy services at this time while admitted and after d/c to address the below listed limitations in order to improve overall safety and independence with functional mobility.     Follow Up Recommendations SNF;Supervision/Assistance - 24 hour    Equipment Recommendations  None recommended by PT    Recommendations for Other Services       Precautions / Restrictions Precautions Precautions: Fall Restrictions Weight Bearing Restrictions: No      Mobility  Bed Mobility Overal bed mobility: Needs Assistance Bed Mobility: Supine to Sit;Sit to Supine     Supine to sit: Supervision Sit to supine: Supervision   General bed mobility comments: increased time, supervision for safety  Transfers Overall transfer level: Needs assistance Equipment used: Rolling walker (2 wheeled) Transfers: Sit to/from Stand Sit to Stand: Min guard         General transfer comment: min guard for  safety  Ambulation/Gait Ambulation/Gait assistance: Min guard Ambulation Distance (Feet): 20 Feet Assistive device: Rolling walker (2 wheeled) Gait Pattern/deviations: Step-through pattern;Decreased step length - right;Decreased step length - left;Decreased stride length;Shuffle;Trunk flexed Gait velocity: decreased Gait velocity interpretation: Below normal speed for age/gender General Gait Details: pt with mild instability but no overt LOB or need for physical assistance, min guard for safety; pt very limited secondary to fatigue  Stairs            Wheelchair Mobility    Modified Rankin (Stroke Patients Only)       Balance Overall balance assessment: Needs assistance Sitting-balance support: Feet supported Sitting balance-Leahy Scale: Good Sitting balance - Comments: pt able to sit EOB with supervision   Standing balance support: During functional activity;Single extremity supported Standing balance-Leahy Scale: Poor Standing balance comment: reliant on at least one UE support                             Pertinent Vitals/Pain Pain Assessment: No/denies pain    Home Living Family/patient expects to be discharged to:: Private residence Living Arrangements: Spouse/significant other Available Help at Discharge: Family;Available 24 hours/day   Home Access: Stairs to enter Entrance Stairs-Rails: Can reach both;Right;Left Entrance Stairs-Number of Steps: 2 Home Layout: One level Home Equipment: Walker - 2 wheels;Cane - single point;Shower seat      Prior Function Level of Independence: Independent with assistive device(s)         Comments: Pt reported that she ambulates with use of SPC and is independent with ADLs; however, pt with  cognitive deficits at baseline with no family present to provide reliable information     Hand Dominance        Extremity/Trunk Assessment   Upper Extremity Assessment Upper Extremity Assessment: Generalized  weakness    Lower Extremity Assessment Lower Extremity Assessment: Generalized weakness       Communication   Communication: No difficulties  Cognition Arousal/Alertness: Awake/alert Behavior During Therapy: WFL for tasks assessed/performed Overall Cognitive Status: History of cognitive impairments - at baseline Area of Impairment: Memory;Problem solving                     Memory: Decreased short-term memory       Problem Solving: Slow processing;Difficulty sequencing General Comments: pt with dementia at baseline; no family/caregivers present to provide information regarding prior cognition level      General Comments      Exercises     Assessment/Plan    PT Assessment Patient needs continued PT services  PT Problem List Decreased strength;Decreased activity tolerance;Decreased balance;Decreased mobility;Decreased coordination;Decreased cognition;Decreased knowledge of use of DME;Decreased safety awareness       PT Treatment Interventions Stair training;Gait training;DME instruction;Functional mobility training;Therapeutic activities;Therapeutic exercise;Balance training;Neuromuscular re-education;Patient/family education    PT Goals (Current goals can be found in the Care Plan section)  Acute Rehab PT Goals Patient Stated Goal: return home PT Goal Formulation: With patient/family Time For Goal Achievement: 01/11/17 Potential to Achieve Goals: Good    Frequency Min 2X/week   Barriers to discharge        Co-evaluation               AM-PAC PT "6 Clicks" Daily Activity  Outcome Measure Difficulty turning over in bed (including adjusting bedclothes, sheets and blankets)?: A Little Difficulty moving from lying on back to sitting on the side of the bed? : A Little Difficulty sitting down on and standing up from a chair with arms (e.g., wheelchair, bedside commode, etc,.)?: Unable Help needed moving to and from a bed to chair (including a  wheelchair)?: A Little Help needed walking in hospital room?: A Little Help needed climbing 3-5 steps with a railing? : A Lot 6 Click Score: 15    End of Session Equipment Utilized During Treatment: Gait belt Activity Tolerance: Patient limited by fatigue Patient left: in bed;with call bell/phone within reach;with bed alarm set;with family/visitor present Nurse Communication: Mobility status PT Visit Diagnosis: Other abnormalities of gait and mobility (R26.89)    Time: 3154-0086 PT Time Calculation (min) (ACUTE ONLY): 28 min   Charges:   PT Evaluation $PT Eval Moderate Complexity: 1 Mod PT Treatments $Therapeutic Activity: 8-22 mins   PT G Codes:   PT G-Codes **NOT FOR INPATIENT CLASS** Functional Assessment Tool Used: AM-PAC 6 Clicks Basic Mobility;Clinical judgement Functional Limitation: Mobility: Walking and moving around Mobility: Walking and Moving Around Current Status (P6195): At least 40 percent but less than 60 percent impaired, limited or restricted Mobility: Walking and Moving Around Goal Status [id number]): At least 1 percent but less than 20 percent impaired, limited or restricted    [facility], [place], DPT [place] 12/28/2016, 3:19 PM

## 2016-12-29 DIAGNOSIS — D649 Anemia, unspecified: Secondary | ICD-10-CM | POA: Diagnosis not present

## 2016-12-29 DIAGNOSIS — D520 Dietary folate deficiency anemia: Secondary | ICD-10-CM | POA: Diagnosis not present

## 2016-12-29 DIAGNOSIS — Z79899 Other long term (current) drug therapy: Secondary | ICD-10-CM | POA: Diagnosis not present

## 2016-12-29 DIAGNOSIS — E1122 Type 2 diabetes mellitus with diabetic chronic kidney disease: Secondary | ICD-10-CM | POA: Diagnosis not present

## 2016-12-29 DIAGNOSIS — D62 Acute posthemorrhagic anemia: Secondary | ICD-10-CM | POA: Diagnosis not present

## 2016-12-29 DIAGNOSIS — M6281 Muscle weakness (generalized): Secondary | ICD-10-CM | POA: Diagnosis not present

## 2016-12-29 DIAGNOSIS — Z66 Do not resuscitate: Secondary | ICD-10-CM | POA: Diagnosis not present

## 2016-12-29 DIAGNOSIS — N390 Urinary tract infection, site not specified: Secondary | ICD-10-CM | POA: Diagnosis not present

## 2016-12-29 DIAGNOSIS — L0291 Cutaneous abscess, unspecified: Secondary | ICD-10-CM | POA: Diagnosis not present

## 2016-12-29 DIAGNOSIS — N2889 Other specified disorders of kidney and ureter: Secondary | ICD-10-CM | POA: Diagnosis not present

## 2016-12-29 DIAGNOSIS — Y92230 Patient room in hospital as the place of occurrence of the external cause: Secondary | ICD-10-CM | POA: Diagnosis not present

## 2016-12-29 DIAGNOSIS — N3001 Acute cystitis with hematuria: Secondary | ICD-10-CM | POA: Diagnosis not present

## 2016-12-29 DIAGNOSIS — D123 Benign neoplasm of transverse colon: Secondary | ICD-10-CM | POA: Diagnosis not present

## 2016-12-29 DIAGNOSIS — E1151 Type 2 diabetes mellitus with diabetic peripheral angiopathy without gangrene: Secondary | ICD-10-CM | POA: Diagnosis not present

## 2016-12-29 DIAGNOSIS — I5032 Chronic diastolic (congestive) heart failure: Secondary | ICD-10-CM | POA: Diagnosis not present

## 2016-12-29 DIAGNOSIS — L02214 Cutaneous abscess of groin: Secondary | ICD-10-CM | POA: Diagnosis not present

## 2016-12-29 DIAGNOSIS — D500 Iron deficiency anemia secondary to blood loss (chronic): Secondary | ICD-10-CM | POA: Diagnosis not present

## 2016-12-29 DIAGNOSIS — K633 Ulcer of intestine: Secondary | ICD-10-CM | POA: Diagnosis not present

## 2016-12-29 DIAGNOSIS — E785 Hyperlipidemia, unspecified: Secondary | ICD-10-CM | POA: Diagnosis not present

## 2016-12-29 DIAGNOSIS — T782XXS Anaphylactic shock, unspecified, sequela: Secondary | ICD-10-CM | POA: Diagnosis not present

## 2016-12-29 DIAGNOSIS — J449 Chronic obstructive pulmonary disease, unspecified: Secondary | ICD-10-CM | POA: Diagnosis not present

## 2016-12-29 DIAGNOSIS — K55059 Acute (reversible) ischemia of intestine, part and extent unspecified: Secondary | ICD-10-CM | POA: Diagnosis not present

## 2016-12-29 DIAGNOSIS — D72829 Elevated white blood cell count, unspecified: Secondary | ICD-10-CM | POA: Diagnosis not present

## 2016-12-29 DIAGNOSIS — I130 Hypertensive heart and chronic kidney disease with heart failure and stage 1 through stage 4 chronic kidney disease, or unspecified chronic kidney disease: Secondary | ICD-10-CM | POA: Diagnosis not present

## 2016-12-29 DIAGNOSIS — T782XXA Anaphylactic shock, unspecified, initial encounter: Secondary | ICD-10-CM | POA: Diagnosis not present

## 2016-12-29 DIAGNOSIS — I5042 Chronic combined systolic (congestive) and diastolic (congestive) heart failure: Secondary | ICD-10-CM | POA: Diagnosis not present

## 2016-12-29 DIAGNOSIS — E2740 Unspecified adrenocortical insufficiency: Secondary | ICD-10-CM | POA: Diagnosis not present

## 2016-12-29 DIAGNOSIS — R911 Solitary pulmonary nodule: Secondary | ICD-10-CM | POA: Diagnosis not present

## 2016-12-29 DIAGNOSIS — R1084 Generalized abdominal pain: Secondary | ICD-10-CM | POA: Diagnosis not present

## 2016-12-29 DIAGNOSIS — I2510 Atherosclerotic heart disease of native coronary artery without angina pectoris: Secondary | ICD-10-CM | POA: Diagnosis not present

## 2016-12-29 DIAGNOSIS — R112 Nausea with vomiting, unspecified: Secondary | ICD-10-CM | POA: Diagnosis not present

## 2016-12-29 DIAGNOSIS — R109 Unspecified abdominal pain: Secondary | ICD-10-CM | POA: Diagnosis not present

## 2016-12-29 DIAGNOSIS — E872 Acidosis: Secondary | ICD-10-CM | POA: Diagnosis not present

## 2016-12-29 DIAGNOSIS — E119 Type 2 diabetes mellitus without complications: Secondary | ICD-10-CM | POA: Diagnosis not present

## 2016-12-29 DIAGNOSIS — R319 Hematuria, unspecified: Secondary | ICD-10-CM | POA: Diagnosis not present

## 2016-12-29 DIAGNOSIS — N179 Acute kidney failure, unspecified: Secondary | ICD-10-CM | POA: Diagnosis not present

## 2016-12-29 DIAGNOSIS — L03314 Cellulitis of groin: Secondary | ICD-10-CM | POA: Diagnosis not present

## 2016-12-29 DIAGNOSIS — G3183 Dementia with Lewy bodies: Secondary | ICD-10-CM | POA: Diagnosis present

## 2016-12-29 DIAGNOSIS — N281 Cyst of kidney, acquired: Secondary | ICD-10-CM | POA: Diagnosis not present

## 2016-12-29 DIAGNOSIS — I272 Pulmonary hypertension, unspecified: Secondary | ICD-10-CM | POA: Diagnosis not present

## 2016-12-29 DIAGNOSIS — L89159 Pressure ulcer of sacral region, unspecified stage: Secondary | ICD-10-CM | POA: Diagnosis not present

## 2016-12-29 DIAGNOSIS — R1013 Epigastric pain: Secondary | ICD-10-CM | POA: Diagnosis not present

## 2016-12-29 DIAGNOSIS — I701 Atherosclerosis of renal artery: Secondary | ICD-10-CM | POA: Diagnosis not present

## 2016-12-29 DIAGNOSIS — N184 Chronic kidney disease, stage 4 (severe): Secondary | ICD-10-CM | POA: Diagnosis not present

## 2016-12-29 DIAGNOSIS — R488 Other symbolic dysfunctions: Secondary | ICD-10-CM | POA: Diagnosis not present

## 2016-12-29 DIAGNOSIS — E876 Hypokalemia: Secondary | ICD-10-CM | POA: Diagnosis not present

## 2016-12-29 DIAGNOSIS — D508 Other iron deficiency anemias: Secondary | ICD-10-CM | POA: Diagnosis not present

## 2016-12-29 DIAGNOSIS — E875 Hyperkalemia: Secondary | ICD-10-CM | POA: Diagnosis not present

## 2016-12-29 DIAGNOSIS — Q2730 Arteriovenous malformation, site unspecified: Secondary | ICD-10-CM | POA: Diagnosis not present

## 2016-12-29 DIAGNOSIS — E46 Unspecified protein-calorie malnutrition: Secondary | ICD-10-CM | POA: Diagnosis not present

## 2016-12-29 DIAGNOSIS — Z7902 Long term (current) use of antithrombotics/antiplatelets: Secondary | ICD-10-CM | POA: Diagnosis not present

## 2016-12-29 DIAGNOSIS — K921 Melena: Secondary | ICD-10-CM | POA: Diagnosis not present

## 2016-12-29 DIAGNOSIS — Z48817 Encounter for surgical aftercare following surgery on the skin and subcutaneous tissue: Secondary | ICD-10-CM | POA: Diagnosis not present

## 2016-12-29 DIAGNOSIS — Z8673 Personal history of transient ischemic attack (TIA), and cerebral infarction without residual deficits: Secondary | ICD-10-CM | POA: Diagnosis not present

## 2016-12-29 DIAGNOSIS — D638 Anemia in other chronic diseases classified elsewhere: Secondary | ICD-10-CM | POA: Diagnosis not present

## 2016-12-29 DIAGNOSIS — L98429 Non-pressure chronic ulcer of back with unspecified severity: Secondary | ICD-10-CM | POA: Diagnosis not present

## 2016-12-29 DIAGNOSIS — N170 Acute kidney failure with tubular necrosis: Secondary | ICD-10-CM | POA: Diagnosis not present

## 2016-12-29 DIAGNOSIS — I429 Cardiomyopathy, unspecified: Secondary | ICD-10-CM | POA: Diagnosis not present

## 2016-12-29 DIAGNOSIS — E440 Moderate protein-calorie malnutrition: Secondary | ICD-10-CM | POA: Diagnosis not present

## 2016-12-29 DIAGNOSIS — N189 Chronic kidney disease, unspecified: Secondary | ICD-10-CM | POA: Diagnosis not present

## 2016-12-29 DIAGNOSIS — J8410 Pulmonary fibrosis, unspecified: Secondary | ICD-10-CM | POA: Diagnosis not present

## 2016-12-29 DIAGNOSIS — E11319 Type 2 diabetes mellitus with unspecified diabetic retinopathy without macular edema: Secondary | ICD-10-CM | POA: Diagnosis not present

## 2016-12-29 DIAGNOSIS — T886XXA Anaphylactic reaction due to adverse effect of correct drug or medicament properly administered, initial encounter: Secondary | ICD-10-CM | POA: Diagnosis not present

## 2016-12-29 DIAGNOSIS — R2689 Other abnormalities of gait and mobility: Secondary | ICD-10-CM | POA: Diagnosis not present

## 2016-12-29 DIAGNOSIS — D509 Iron deficiency anemia, unspecified: Secondary | ICD-10-CM | POA: Diagnosis not present

## 2016-12-29 LAB — CBC WITH DIFFERENTIAL/PLATELET
BASOS ABS: 0 10*3/uL (ref 0.0–0.1)
BASOS PCT: 0 %
Eosinophils Absolute: 0.1 10*3/uL (ref 0.0–0.7)
Eosinophils Relative: 1 %
HEMATOCRIT: 30.1 % — AB (ref 36.0–46.0)
Hemoglobin: 10 g/dL — ABNORMAL LOW (ref 12.0–15.0)
Lymphocytes Relative: 6 %
Lymphs Abs: 0.5 10*3/uL — ABNORMAL LOW (ref 0.7–4.0)
MCH: 28.9 pg (ref 26.0–34.0)
MCHC: 33.2 g/dL (ref 30.0–36.0)
MCV: 87 fL (ref 78.0–100.0)
MONO ABS: 0.6 10*3/uL (ref 0.1–1.0)
Monocytes Relative: 7 %
NEUTROS ABS: 7.3 10*3/uL (ref 1.7–7.7)
NEUTROS PCT: 86 %
Platelets: 320 10*3/uL (ref 150–400)
RBC: 3.46 MIL/uL — ABNORMAL LOW (ref 3.87–5.11)
RDW: 15.3 % (ref 11.5–15.5)
WBC: 8.5 10*3/uL (ref 4.0–10.5)

## 2016-12-29 LAB — BASIC METABOLIC PANEL
ANION GAP: 7 (ref 5–15)
BUN: 59 mg/dL — ABNORMAL HIGH (ref 6–20)
CALCIUM: 8.6 mg/dL — AB (ref 8.9–10.3)
CO2: 11 mmol/L — AB (ref 22–32)
Chloride: 124 mmol/L — ABNORMAL HIGH (ref 101–111)
Creatinine, Ser: 3.32 mg/dL — ABNORMAL HIGH (ref 0.44–1.00)
GFR, EST AFRICAN AMERICAN: 16 mL/min — AB (ref >60)
GFR, EST NON AFRICAN AMERICAN: 13 mL/min — AB (ref >60)
Glucose, Bld: 111 mg/dL — ABNORMAL HIGH (ref 65–99)
Potassium: 4.4 mmol/L (ref 3.5–5.1)
Sodium: 142 mmol/L (ref 135–145)

## 2016-12-29 LAB — GLUCOSE, CAPILLARY
GLUCOSE-CAPILLARY: 197 mg/dL — AB (ref 65–99)
GLUCOSE-CAPILLARY: 224 mg/dL — AB (ref 65–99)
GLUCOSE-CAPILLARY: 164 mg/dL — AB (ref 65–99)
Glucose-Capillary: 86 mg/dL (ref 65–99)
Glucose-Capillary: 214 mg/dL — ABNORMAL HIGH (ref 65–99)

## 2016-12-29 MED ORDER — TRAMADOL HCL 50 MG PO TABS
50.0000 mg | ORAL_TABLET | Freq: Two times a day (BID) | ORAL | Status: DC | PRN
  Administered 2016-12-29: 50 mg via ORAL
  Filled 2016-12-29: qty 1

## 2016-12-29 MED ORDER — CLINDAMYCIN HCL 300 MG PO CAPS: 300.0000 mg | ORAL_CAPSULE | Freq: Three times a day (TID) | ORAL | 0 refills | Status: DC

## 2016-12-29 MED ORDER — FUROSEMIDE 80 MG PO TABS: 40.0000 mg | ORAL_TABLET | Freq: Every day | ORAL | 1 refills | Status: DC

## 2016-12-29 MED ORDER — TRAMADOL HCL 50 MG PO TABS: 50.0000 mg | ORAL_TABLET | Freq: Two times a day (BID) | ORAL | 0 refills | Status: DC | PRN

## 2016-12-29 NOTE — Progress Notes (Signed)
Pt scheduled for D/C to [facility] today and phone number to call report given by case management. I have made 5 attempts to call report since 16:30 with no answer from facility. Case Management notified.

## 2016-12-29 NOTE — NC FL2 (Signed)
[place] MEDICAID FL2 LEVEL OF CARE SCREENING TOOL     IDENTIFICATION  Patient Name: [name] Birthdate: 03/26/1950 Sex: female Admission Date (Current Location): 12/27/2016  [place] and [place] Number:  Kathleen Argue [id number] [place] and Address:  [facility] [place], [place] [address], [place], [place] [address]      Provider Number: [id number]  Attending Physician Name and Address:  Nita Sells, MD  Relative Name and Phone Number:  Natane Heward - spouse; [phone]     Current Level of Care: SNF Recommended Level of Care: [place] Prior Approval Number:    Date Approved/Denied:   PASRR Number: [id number]  Discharge Plan: SNF    Current Diagnoses: Patient Active Problem List   Diagnosis Date Noted  . Caliectasis 12/27/2016  . Anemia 12/27/2016  . Skin breakdown 12/27/2016  . Boil 12/27/2016  . Hematuria 12/27/2016  . Abscess of groin   . Bilateral carotid artery stenosis 08/22/2016  . Lung nodule 06/29/2016  . Acute kidney injury superimposed on chronic kidney disease ([place]) 06/23/2016  . Acute on chronic combined systolic and diastolic CHF (congestive heart failure) ([place]) 06/23/2016  . Hypokalemia 06/23/2016  . Acute esophagitis   . Angiodysplasia of duodenum   . Symptomatic anemia 06/21/2016  . Cardiac enzymes elevated   . Elevated liver enzymes   . Transaminitis   . Demand ischemia ([place])   . Coffee ground emesis   . Heme positive stool   . Dementia 03/04/2016  . CAP (community acquired pneumonia) 05/13/2015  . Indwelling catheter present on admission 04/21/2015  . Difficulty breathing   . Pulmonary infiltrates   . Staphylococcus aureus bacteremia   . Palliative care encounter   . Malnutrition of moderate degree 03/30/2015  . HCAP (healthcare-associated pneumonia) 03/29/2015  . Acute respiratory failure ([place]) 03/29/2015  . Hypomagnesemia 03/29/2015  . Low serum vitamin D 03/29/2015  . Metabolic acidosis  84/13/2440  . Coronary artery disease involving native coronary artery of native heart with angina pectoris ([place])   . Sacral ulcer ([place])   . Urinary retention   . Hypocalcemia 03/20/2015  . Lewy body dementia without behavioral disturbance   . AKI (acute kidney injury) ([place])   . Protein-calorie malnutrition ([place]) 01/24/2015  . Abnormal nuclear stress test   . Stage 4 chronic kidney disease ([place])   . Carotid artery disease ([place])   . CAD (coronary artery disease)   . Chronic diastolic CHF (congestive heart failure) ([place])   . Essential hypertension   . Former tobacco use   . Bradycardia   . UTI (urinary tract infection) 01/27/2012  . Nausea & vomiting 01/26/2012  . 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 Diet-controlled diabetes mellitus ([place]) 01/25/2012  . Iron deficiency anemia 01/25/2012  . Renal artery stenosis ([place]) 11/19/2010  . Peripheral vascular disease ([place]) 11/19/2010  . Diabetes mellitus with complication ([place]) 12/18/2534  . Hyperlipidemia 05/16/2008  . OBESITY 05/16/2008  . Anemia, chronic disease 05/16/2008  . ANXIETY DEPRESSION 05/16/2008  . History of CVA (cerebrovascular accident) 05/16/2008  . Chronic obstructive pulmonary disease ([place]) 05/16/2008  . DIVERTICULITIS, HX OF 05/16/2008    Orientation RESPIRATION BLADDER Height & Weight     Self, Time, Situation, Place  Normal Continent Weight: 106 lb 11.2 oz (48.4 kg) Height:  4\' 11"  (149.9 cm)  BEHAVIORAL SYMPTOMS/MOOD NEUROLOGICAL BOWEL NUTRITION STATUS      Continent Diet(Heart healthy - Carb modified)  AMBULATORY STATUS COMMUNICATION OF NEEDS Skin  Limited Assist Verbally Other (Comment)(Stage 2 pressure injury to posterior coccyx, Puncture to left anterior groin; moisture associated skin damage to breast, buttocks-treated with barrier cream; rash to posterior  buttocks)                       Personal Care Assistance Level of Assistance  Bathing, Feeding, Dressing  Bathing Assistance: Limited assistance Feeding assistance: Independent Dressing Assistance: Limited assistance     Functional Limitations Info  Sight, Hearing, Speech Sight Info: Adequate Hearing Info: Adequate Speech Info: Adequate    SPECIAL CARE FACTORS FREQUENCY  PT (By licensed PT)     PT Frequency: Evaluated 11/7              Contractures Contractures Info: Not present    Additional Factors Info  Code Status, Allergies Code Status Info: Full Allergies Info: No known allergies            Current Medications (12/29/2016):  This is the current hospital active medication list Current Facility-Administered Medications  Medication Dose Route Frequency Provider Last Rate Last Dose  . acetaminophen (TYLENOL) tablet 650 mg  650 mg Oral Q6H PRN [name], NP   650 mg at 12/29/16 3419   Or  . acetaminophen (TYLENOL) suppository 650 mg  650 mg Rectal Q6H PRN [name], NP      . albuterol (PROVENTIL) (2.5 MG/3ML) 0.083% nebulizer solution 2.5 mg  2.5 mg Inhalation Q6H PRN [name], NP      . atorvastatin (LIPITOR) tablet 80 mg  80 mg Oral q1800 [name], NP   80 mg at 12/28/16 1635  . bisacodyl (DULCOLAX) suppository 10 mg  10 mg Rectal Daily PRN [name], NP      . carvedilol (COREG) tablet 6.25 mg  6.25 mg Oral QHS Black, Karen M, NP      . clindamycin (CLEOCIN) capsule 300 mg  300 mg Oral Q8H Nita Sells, MD   300 mg at 12/29/16 1305  . clotrimazole (LOTRIMIN) 1 % topical solution   Topical BID Black, Karen M, NP      . donepezil (ARICEPT) tablet 10 mg  10 mg Oral QHS [name], NP   10 mg at 12/28/16 2351  . feeding supplement (ENSURE ENLIVE) (ENSURE ENLIVE) liquid 237 mL  237 mL Oral BID BM Nita Sells, MD   237 mL at 12/29/16 1305  . ferrous sulfate tablet 325 mg  325 mg Oral TID WC [name], NP   325 mg at 12/29/16 1305  . hydrALAZINE (APRESOLINE) tablet 50 mg  50 mg Oral TID [name], NP   50 mg at  12/29/16 6222  . ondansetron (ZOFRAN) tablet 4 mg  4 mg Oral Q6H PRN [name], NP       Or  . ondansetron [facility]) injection 4 mg  4 mg Intravenous Q6H PRN Black, Karen M, NP      . pantoprazole (PROTONIX) EC tablet 40 mg  40 mg Oral Daily [name], NP   40 mg at 12/29/16 0907  . potassium chloride SA (K-DUR,KLOR-CON) CR tablet 40 mEq  40 mEq Oral TID Nita Sells, MD   40 mEq at 12/29/16 0907  . senna-docusate (Senokot-S) tablet 1 tablet  1 tablet Oral QHS PRN [name], NP      . sertraline (ZOLOFT) tablet 50 mg  50 mg Oral Daily Black, Lezlie Octave, NP   50 mg  at 12/29/16 0907  . traMADol (ULTRAM) tablet 50 mg  50 mg Oral Q12H PRN Nita Sells, MD   50 mg at 12/29/16 1139     Discharge Medications: Please see discharge summary for a list of discharge medications.  Relevant Imaging Results:  Relevant Lab Results:   Additional Information ss#[id number]  Sable Feil, LCSW

## 2016-12-29 NOTE — Progress Notes (Signed)
Pt discharged to [facility] via ambulance. Pt is hemodynamically stable. Report given to transporters.

## 2016-12-29 NOTE — Clinical Social Work Note (Deleted)
Ms. Padovano is medically stable for discharge and will d/c to [facility]. Daughter [name] contacted and is aware of discharge and selected [facility] as the facility her her mother's rehab and medical care.  Angel Weedon Givens, MSW, LCSW Licensed Clinical Social Worker [place] [phone]

## 2016-12-29 NOTE — Clinical Social Work Placement (Signed)
   CLINICAL SOCIAL WORK PLACEMENT  NOTE 12/29/16 - DISCHARGED TO [facility]ULANCE  Date:  12/29/2016  Patient Details  Name: DLISA BARNWELL MRN: [id number] Date of Birth: February 16, 1951  Clinical Social Work is seeking post-discharge placement for this patient at the [place] level of care (*CSW will initial, date and re-position this form in  chart as items are completed):  No(Daughter provided with facility choices by phone.)   Patient/family provided with [facility] Work Department's list of facilities offering this level of care within the geographic area requested by the patient (or if unable, by the patient's family).  Yes   Patient/family informed of their freedom to choose among providers that offer the needed level of care, that participate in Medicare, Medicaid or managed care program needed by the patient, have an available bed and are willing to accept the patient.  No   Patient/family informed of [facility]'s ownership interest in [facility] and [facility], as well as of the fact that they are under no obligation to receive care at these facilities.  PASRR submitted to EDS on       PASRR number received on       Existing PASRR number confirmed on 12/29/16     FL2 transmitted to all facilities in geographic area requested by pt/family on       FL2 transmitted to all facilities within larger geographic area on       Patient informed that his/her managed care company has contracts with or will negotiate with certain facilities, including the following:            Patient/family informed of bed offers received.  Patient chooses bed at       Physician recommends and patient chooses bed at      Patient to be transferred to   on  .  Patient to be transferred to facility by       Patient family notified on   of transfer.  Name of family member notified:        PHYSICIAN       Additional Comment:     _______________________________________________ Sable Feil, LCSW 12/29/2016, 6:08 PM

## 2016-12-29 NOTE — Care Management Note (Signed)
Case Management Note  Patient Details  Name: [name] MRN: [id number] Date of Birth: Oct 31, 1950  Subjective/Objective:  Pt presented for AKI. Plan to d/c to  SNF at [facility] 12-29-16.                  Action/Plan: CSW is working with the patient in regards to SNF placement. Referral received for H/H Services- plan SNF. No further needs from CM at this time.   Expected Discharge Date:                  Expected Discharge Plan:  Skilled Nursing Facility  In-House Referral:  Clinical Social Work  Discharge planning Services  CM Consult  Post Acute Care Choice:  NA Choice offered to:  NA  DME Arranged:  N/A DME Agency:  NA  HH Arranged:  NA HH Agency:  NA  Status of Service:  Completed, signed off  If discussed at [place] of Stay Meetings, dates discussed:    Additional Comments:  Bethena Roys, RN 12/29/2016, 2:19 PM

## 2016-12-29 NOTE — Clinical Social Work Note (Addendum)
Clinical Social Work Assessment  Patient Details  Name: [name] MRN: [id number] Date of Birth: October 21, 1950  Date of referral:  12/29/16               Reason for consult:  Facility Placement(Patient from [place])                Permission sought to share information with:  Family Supports Permission granted to share information::  Yes, Verbal Permission Granted  Name::     [name]  Agency::     Relationship::  Daughter  Contact Information:  [phone]  Housing/Transportation Living arrangements for the past 2 months:  Skilled Nursing Facility(From [place]) Source of Information:  Adult Children, Other (Comment Required)(Daughter Engineer, structural and chart review) Patient Interpreter Needed:  None Criminal Activity/Legal Involvement Pertinent to Current Situation/Hospitalization:  No - Comment as needed Significant Relationships:  Adult Children, Spouse Lives with:  Spouse, Facility Resident(Lived at home with spouse before going to SNF) Do you feel safe going back to the place where you live?  Yes Need for family participation in patient care:  Yes (Comment)  Care giving concerns:  Patient agreeable to rehab and also talked with daughter, [name] who is also in agreement and assisted in choosing a skilled facility for her mother.  Social Worker assessment / plan:  CSW talked briefly with patient at the bedside regarding readiness for discharge and d/c disposition. Patient reported that she has been to rehab before but could not remember the name of the facility.  [name] gave CSW permission to contact her daughter regarding her discharge plan. Patient then drifted back off to sleep. CSW contacted daughter while in room with patient and daughter in agreement with ST rehab and provided CSW with facility preferences.    Employment status:  Retired Nurse, adult, Medicaid In [place] PT Recommendations:  [place] /  Referral to community resources:  Other (Comment Required)(None needed or requested as patient from SNF)  Patient/Family's Response to care:  No concerns expressed by patient or daughter regarding care during hospitalization.  Patient/Family's Understanding of and Emotional Response to Diagnosis, Current Treatment, and Prognosis:  Daughter expressed agreement with her mother needing ST rehab and assisted her mother in choosing a facility for rehab and  continued medical care.  Emotional Assessment Appearance:  Appears older than stated age Attitude/Demeanor/Rapport:  Other(Sleepy, as patient was napping when CSW entered room.) Affect (typically observed):  Appropriate Orientation:  Oriented to Self, Oriented to Place, Oriented to Situation, Oriented to  Time Alcohol / Substance use:  Tobacco Use, Alcohol Use, Illicit Drugs(Patient reported that she smokes and does not drink or use illicit drugs) Psych involvement (Current and /or in the community):  No (Comment)  Discharge Needs  Concerns to be addressed:    Readmission within the last 30 days:  No Current discharge risk:  None Barriers to Discharge:  No Barriers Identified   Sable Feil, LCSW 12/29/2016, 5:55 PM

## 2016-12-29 NOTE — Discharge Summary (Signed)
Physician Discharge Summary  [name] XIP:[id number] DOB: 1951-02-14 DOA: 12/27/2016  PCP: Arnoldo Morale, MD  Admit date: 12/27/2016 Discharge date: 12/29/2016  Time spent: 35 minutes  Recommendations for Outpatient Follow-up:  1. Will need to complete 4 more days of oral clindamycin for wound in left groin-would dress wound with iodoform dressing until the iodoform dressing falls out daily 2. Have changed the dose of Lasix to 40 mg daily from 80 mg twice daily might need up titration of the same 3. Needs CBC plus basic metabolic in 3-5 days 4. Recommend orientation checks as well as minimizing narcotics and tramadol however prescription given for tramadol (was prior to admission medication) every 12 hourly sparingly-would use Tylenol as first choice for pain 5. Please keep scheduled appt Dr. Gloriann Loan [facility] on 02/03/17 at 13:30 pm---patient will need to be transported to that facility from SNF at that time 6. Check q am sugars--she has a tendancy to run low  Discharge Diagnoses:  Principal Problem:   Acute kidney injury superimposed on chronic kidney disease ([place]) Active Problems:   Diabetes mellitus with complication ([place])   OBESITY   Anemia, chronic disease   ANXIETY DEPRESSION   History of CVA (cerebrovascular accident)   Chronic obstructive pulmonary disease ([place])   Renal artery stenosis (HCC)   UTI (urinary tract infection)   Carotid artery disease (HCC)   CAD (coronary artery disease)   Chronic diastolic CHF (congestive heart failure) ([place])   Essential hypertension   Lewy body dementia without behavioral disturbance   Symptomatic anemia   Hypokalemia   Lung nodule   Caliectasis   Anemia   Skin breakdown   Boil   Hematuria   Discharge Condition: improved  Diet recommendation: hh diabetic  Filed Weights   12/27/16 2057 12/28/16 2137  Weight: 48.6 kg (107 lb 2.3 oz) 48.4 kg (106 lb 11.2 oz)    History of present illness:  66 year old female Diabetes  mellitus type 2 with nephropathy             Chronic kidney disease stage IV Hypertension Hyperlipidemia Peripheral vascular disease previous MSSA bacteremia 1/4 bottles April 2018 status post 28 days treatment also treated for multifocal pneumonia Prior CVA on Plavix COPD  09/03/2004 Peripheral vascular disease status post left common iliac and external iliac artery stents Cardiomyopathy EF 25% not a candidate for PCI, CABG 01/2015             Multivessel disease as per note 01/28/2015             NUC 7.10.13 --69% EF Iron deficiency anemia Previous admission 06/24/2016 hemoglobin of 5 EGD = hiatal hernia, reflux, erosive esophagitis and single nonbleeding angiectasia and duodenum treated with APC             Also had a GI bleed on admission 03/26/2015--EGD was noncontributory at the time             Prior subacute bleed 10/20/2003--capsule is negative-prior colonoscopy 2003 [-]  Recent emergency room visit 12/20/2016 with hematuria-found to have mass in the right renal collecting system and 7 mm stone outside and was told to follow-up with urologist at that time  Admitted with acute kidney injury in setting of Lasix and hematuria creatinine 2.8 on admission prior creatinine between 2 and 3     Hospital Course:   Skin rash and boil with I&D done in the emergency room 11/6 Area more consistent with fungal intertrigo      place on  clindamycin for 5 days and hope that this clears up stop date for antibiotics would be 01/01/17 We will pack the area of the wound in the groin with iodoform  Hematuria This is been going on since 12/20/2016 and patient was found to have a right renal mass with a 7 mm stone on CT scan and was scheduled for follow-up with urologist Dr. Junious Silk as an outpatient for further imaging--transport will need to be set up for the same             Hemoglobin has dropped slightly from 13-10-9 and repeat labs in a.m.-labs are stabilized on discharge  possible urinary  tract infection             Holding at this time antibiotics for this indication as they were prescribed over week ago we will monitor for any further issue  Hypokalemia potassium still in the 3 range             So we will increase twice daily 40 mg 2 3 times daily 40 mg K-Dur                Potassium on discharge was 4.4 so holding further checks for now and instead recheck as an outpatient  Diabetes mellitus type 2             Await A1c             Blood sugars are actually low and she is eating only about 50% of meals             Family tells me that she was taken off of all anti-glycemic agents about 2 years ago because her sugars were better and we will discontinue sliding   Moderate dementia             Not severe but does need reorientation, husband gives meds to her, daughter organizes               Will need frequent reorientation             ContinueAricept 10 at bedtime and sertraline for behavioral disturbances noting that sertraline              does not decrease mortality and in fact increases it  Chronic diastolic heart failure Previous cardiomyopathy EF 25% not candidate for PCI multivessel disease             Continue Coreg 6.25 at bedtime, hydralazine 50 3 times daily           Previous multiple GI bleeds with erosive esophagitis and single nonbleeding ectasia of the             Last admission 06/24/2016 for the same             Does not need any further transfusion at this time             Given recent hematuria as well as prior GI bleed would hold aspirin at this time until seen by             urologist  Sacral decubitus             Daughter has been dressing the area [facility] cleft about 2 cm--would place barrier dressing              Previously has been treated with Santyl to the area at home  Have encouraged patient to get up and move around we will get therapy to see the patient in             consult  Peripheral vascular disease  status post left common iliac and external iliac artery stents as well as prior CVA on Plavix             For now would hold aspirin 81 mg, not currently on Plavix  Discharge Exam: Vitals:   12/29/16 0622 12/29/16 0827  BP: (!) 153/47 (!) 156/43  Pulse: 73 72  Resp: 18 18  Temp: 97.9 F (36.6 C) 97.8 F (36.6 C)  SpO2: 100% 99%    General: Alert slightly confused but otherwise well Cardiovascular: Clinically clear no added sound Respiratory: S1-S2 no murmur rub or gallop abdomen soft left groin has 1 cm round area in the intertriginous area which has an open wound which has been dressed  Discharge Instructions    Current Discharge Medication List    CONTINUE these medications which have NOT CHANGED   Details  albuterol (PROVENTIL HFA;VENTOLIN HFA) 108 (90 Base) MCG/ACT inhaler Inhale 2 puffs into the lungs every 6 (six) hours as needed for wheezing or shortness of breath. Qty: 1 Inhaler, Refills: 3   Associated Diagnoses: Chronic obstructive pulmonary disease, unspecified COPD type (HCC)    aspirin EC 81 MG tablet Take 81 mg by mouth daily.    atorvastatin (LIPITOR) 80 MG tablet TAKE 1 TABLET BY MOUTH  DAILY AT 6 PM. Qty: 90 tablet, Refills: 1   Associated Diagnoses: Pure hypercholesterolemia    carvedilol (COREG) 6.25 MG tablet TAKE 1 TABLET BY MOUTH TWO  TIMES DAILY WITH A MEAL Qty: 180 tablet, Refills: 1   Associated Diagnoses: Essential hypertension    cefUROXime (CEFTIN) 500 MG tablet Take 1 tablet (500 mg total) by mouth 2 (two) times daily with a meal. Qty: 14 tablet, Refills: 0    donepezil (ARICEPT) 10 MG tablet Take 1 tablet (10 mg total) by mouth at bedtime. Qty: 90 tablet, Refills: 1   Associated Diagnoses: Dementia without behavioral disturbance, unspecified dementia type    ferrous sulfate 325 (65 FE) MG tablet Take 1 tablet (325 mg total) by mouth 2 (two) times daily with a meal. Qty: 180 tablet, Refills: 1   Associated Diagnoses: CKD (chronic kidney  disease), stage IV (HCC)    fish oil-omega-3 fatty acids 1000 MG capsule Take 1 g by mouth 3 (three) times daily. Reported on 04/09/2015    furosemide (LASIX) 80 MG tablet Take 1 tablet (80 mg total) by mouth 2 (two) times daily. Qty: 180 tablet, Refills: 1    hydrALAZINE (APRESOLINE) 50 MG tablet TAKE 1 TABLET BY MOUTH 3  TIMES DAILY Qty: 90 tablet, Refills: 0    nitroGLYCERIN (NITROSTAT) 0.4 MG SL tablet Place 0.4 mg under the tongue every 5 (five) minutes as needed for chest pain. X 3 doses    pantoprazole (PROTONIX) 40 MG tablet TAKE 1 TABLET BY MOUTH  DAILY Qty: 90 tablet, Refills: 0    sertraline (ZOLOFT) 50 MG tablet Take 1 tablet (50 mg total) by mouth daily. Qty: 90 tablet, Refills: 1   Associated Diagnoses: Dysthymic disorder    acetaminophen (TYLENOL) 325 MG tablet Take 325 mg by mouth every 6 (six) hours as needed.    potassium chloride (K-DUR) 10 MEQ tablet Take 1 tablet (10 mEq total) by mouth daily. Qty: 5 tablet, Refills: 0       No Known Allergies  The results of significant diagnostics from this hospitalization (including imaging, microbiology, ancillary and laboratory) are listed below for reference.    Significant Diagnostic Studies: Ct Renal Stone Study  Result Date: 12/20/2016 CLINICAL DATA:  66 year old female with hematuria. EXAM: CT ABDOMEN AND PELVIS WITHOUT CONTRAST TECHNIQUE: Multidetector CT imaging of the abdomen and pelvis was performed following the standard protocol without IV contrast. COMPARISON:  Renal ultrasound dated 01/05/2016 and CT dated 09/07/2012 the FINDINGS: Evaluation of this exam is limited in the absence of intravenous contrast. Lower chest: There is a 10 x 8 mm subpleural nodule at the right lung base (series 4, image 9). A 3 mm nodule in the lateral right lower lobe (series 4, image 4) may represent a calcified granuloma. There is coronary vascular calcification. There is no intra-abdominal free air. Small free fluid within the  pelvis. Hepatobiliary: The liver is unremarkable. There is cholecystectomy changes. Pancreas: Unremarkable. No pancreatic ductal dilatation or surrounding inflammatory changes. Spleen: Normal in size without focal abnormality. Adrenals/Urinary Tract: The adrenal glands are unremarkable. There is an extrarenal pelvis on the right with mild pelviectasis. High attenuating content within the right renal collecting system and pelvis most consistent with blood product. This is of uncertain etiology but concerning for underlying neoplasm. Evaluation is very limited on this noncontrast CT. Further evaluation with CT urography or direct visualization with ureteroscopy recommended. There is no hydronephrosis or nephrolithiasis. There is mild right perinephric fluid. A 7 mm stone in the right pelvis (series 2, image 66) appears to be adjacent to the ureter and not in the right ureter. There is a 15 mm hypodense lesion in the upper pole of the left kidney which is not well characterized but likely represents a cyst. A subcentimeter high attenuating lesion in the inferior pole of the left kidney is also not characterized but may represent a complex/hemorrhagic cyst. There is no hydronephrosis or nephrolithiasis on the left. The left ureter is unremarkable. Stomach/Bowel: There is no evidence of bowel obstruction or active inflammation. Appendectomy. Vascular/Lymphatic: There is advanced aortoiliac atherosclerotic disease. There is extensive atherosclerotic calcification of the mesenteric vasculature as well as bilateral renovascular calcification. The IVC is grossly unremarkable. No portal venous gas. There is no adenopathy. Reproductive: The uterus is retroflexed. The ovaries are grossly unremarkable. Other: There is diffuse subcutaneous edema and stranding. No fluid collection. Musculoskeletal: Osteopenia with degenerative changes of the spine. Grade 1 L4-L5 anterolisthesis. IMPRESSION: 1. Mild caliectasis of the right renal  pelvis containing blood product. This is of indeterminate etiology but underlying mass is not excluded. Further evaluation with CT urography or direct visualization with ureteroscopy recommended. There is no hydronephrosis or nephrolithiasis on either side. 2. **An incidental finding of potential clinical significance has been found. A 10 mm right lung base subpleural nodule. Consider one of the following in 3 months for both low-risk and high-risk individuals: (a) repeat chest CT, (b) follow-up PET-CT, or (c) tissue sampling. This recommendation follows the consensus statement: Guidelines for Management of Incidental Pulmonary Nodules Detected on CT Images: From the Fleischner Society 2017; Radiology 2017; 284:228-243.** 3. Advanced atherosclerotic disease of the aorta Aortic Atherosclerosis (ICD10-I70.0). Atherosclerotic disease of the mesenteric vasculature as well as renovascular calcification. 4. No bowel obstruction or active inflammation. Electronically Signed   By: Anner Crete M.D.   On: 12/20/2016 20:24    Microbiology: Recent Results (from the past 240 hour(s))  Urine culture     Status: Abnormal   Collection Time: 12/20/16  5:13 PM  Result Value  Ref Range Status   Specimen Description URINE, RANDOM  Final   Special Requests NONE  Final   Culture >=100,000 COLONIES/mL ESCHERICHIA COLI (A)  Final   Report Status 12/23/2016 FINAL  Final   Organism ID, Bacteria ESCHERICHIA COLI (A)  Final      Susceptibility   Escherichia coli - MIC*    AMPICILLIN <=2 SENSITIVE Sensitive     CEFAZOLIN <=4 SENSITIVE Sensitive     CEFTRIAXONE <=1 SENSITIVE Sensitive     CIPROFLOXACIN >=4 RESISTANT Resistant     GENTAMICIN >=16 RESISTANT Resistant     IMIPENEM <=0.25 SENSITIVE Sensitive     NITROFURANTOIN <=16 SENSITIVE Sensitive     TRIMETH/SULFA >=320 RESISTANT Resistant     AMPICILLIN/SULBACTAM <=2 SENSITIVE Sensitive     PIP/TAZO <=4 SENSITIVE Sensitive     Extended ESBL NEGATIVE Sensitive      * >=100,000 COLONIES/mL ESCHERICHIA COLI     Labs: Basic Metabolic Panel: Recent Labs  Lab 12/27/16 1054 12/27/16 1725 12/28/16 0231 12/29/16 0432  NA 138  --  140 142  K 3.3*  --  3.1* 4.4  CL 116*  --  120* 124*  CO2 10*  --  11* 11*  GLUCOSE 93  --  59* 111*  BUN 68*  --  67* 59*  CREATININE 3.81*  --  3.59* 3.32*  CALCIUM 8.6*  --  8.2* 8.6*  MG  --  1.8  --   --    Liver Function Tests: No results for input(s): AST, ALT, ALKPHOS, BILITOT, PROT, ALBUMIN in the last 168 hours. No results for input(s): LIPASE, AMYLASE in the last 168 hours. No results for input(s): AMMONIA in the last 168 hours. CBC: Recent Labs  Lab 12/27/16 1054 12/28/16 0231 12/29/16 0432  WBC 12.0* 8.8 8.5  NEUTROABS  --   --  7.3  HGB 10.5* 9.7* 10.0*  HCT 32.1* 29.4* 30.1*  MCV 87.0 86.0 87.0  PLT 279 269 320   Cardiac Enzymes: No results for input(s): CKTOTAL, CKMB, CKMBINDEX, TROPONINI in the last 168 hours. BNP: BNP (last 3 results) Recent Labs    06/21/16 1624  BNP >4,500.0*    ProBNP (last 3 results) No results for input(s): PROBNP in the last 8760 hours.  CBG: Recent Labs  Lab 12/28/16 1719 12/28/16 2240 12/28/16 2336 12/29/16 0111 12/29/16 0804  GLUCAP 89 67 60* 86 197*       Signed:  Nita Sells MD   [facility] 12/29/2016, 2:19 PM

## 2016-12-30 ENCOUNTER — Encounter: Payer: Self-pay | Admitting: Internal Medicine

## 2016-12-30 ENCOUNTER — Non-Acute Institutional Stay (SKILLED_NURSING_FACILITY): Payer: Medicare Other | Admitting: Internal Medicine

## 2016-12-30 DIAGNOSIS — N189 Chronic kidney disease, unspecified: Secondary | ICD-10-CM | POA: Diagnosis not present

## 2016-12-30 DIAGNOSIS — L02214 Cutaneous abscess of groin: Secondary | ICD-10-CM | POA: Diagnosis not present

## 2016-12-30 DIAGNOSIS — R911 Solitary pulmonary nodule: Secondary | ICD-10-CM

## 2016-12-30 DIAGNOSIS — L98429 Non-pressure chronic ulcer of back with unspecified severity: Secondary | ICD-10-CM | POA: Diagnosis not present

## 2016-12-30 DIAGNOSIS — N179 Acute kidney failure, unspecified: Secondary | ICD-10-CM | POA: Diagnosis not present

## 2016-12-30 DIAGNOSIS — N2889 Other specified disorders of kidney and ureter: Secondary | ICD-10-CM

## 2016-12-30 NOTE — Assessment & Plan Note (Addendum)
Pre-existing prior to admission to hospital and SNF Wound care at [facility]

## 2016-12-30 NOTE — Assessment & Plan Note (Signed)
Complete full course of clindamycin Add probiotic

## 2016-12-30 NOTE — Patient Instructions (Signed)
See assessment and plan under each diagnosis in the problem list and acutely for this visit 

## 2016-12-30 NOTE — Assessment & Plan Note (Signed)
Arrange transfer if still at SNF at that time

## 2016-12-30 NOTE — Assessment & Plan Note (Addendum)
36-pack-year history of smoking  Not surgical candidate with advanced multiple comorbidities, PCP can follow-up at SNF discharge as clinically indicated

## 2016-12-30 NOTE — Assessment & Plan Note (Signed)
12/29/16 Creatinine 1.32, BUN 59, GFR 13  Review medication  list and reduce or eliminate any potentially nephrotoxic agents

## 2016-12-30 NOTE — Progress Notes (Signed)
NURSING HOME LOCATION:  [facility] ROOM NUMBER:  111-A  CODE STATUS:  Full Code  PCP:  Arnoldo Morale, MD  [place] [address]    This is a comprehensive admission note to [facility] performed on this date less than 30 days from date of admission. Included are preadmission medical/surgical history;reconciled medication list; family history; social history and comprehensive review of systems.  Corrections and additions to the records were documented . Comprehensive physical exam was also performed. Additionally a clinical summary was entered for each active diagnosis pertinent to this admission in the Problem List to enhance continuity of care.  HPI: Patient was hospitalized 11/6-11/8/18 for AKI with creatinine of 3.81. A "boil" and rash were noted in the inguinal area and I&D performed in the emergency room 11/6. Fungal intertrigo was suggested clinically as the cause of the rash.She received wound care with iodoform packing and was placed on oral clindamycin (15 pills ordered 11/8). The patient also was found to have a pre-existing sacral decubitus. Hospitalization was associated with drop in hemoglobin from 13 down to 10.9 Hypokalemia was aggressively treated, at discharge potassium was 4.4. Oral intake was poor sugars were low. Sliding scale which was initiated was discontinued.(Note:At the SNF glucoses have ranged from 157 up to 184). Aricept was continued for her dementia which was considered moderate. Sertraline was continued for behavioral disturbances. On 12/29/16 creatinine was 3.32, BUN 59, GFR 13 ,hemoglobin 10 and hematocrit 30.1. The anemia was essentially stable and was normochromic, normocytic.  [facility] appointment was scheduled with Dr. Gloriann Loan for 02/03/17 at 13:30 p.m. to follow-up right renal collecting system mass and 7 mm stone associated with hematuria. On 12/20/16 culture had revealed greater than 100,000 Escherichia coli for  which antibiotics were initiated.Cefuroximine PO ends 11/11. An incidental finding was a 10 mm R lung based subpleural nodule.  Past medical and surgical history:Includes history of upper GI bleed from AVMs, renal artery stenosis, pulmonary hypertension, peripheral vascular disease, hypertensive heart disease with congestive heart failure, diabetes, essential hypertension, CAD, coronary artery disease, and COPD. Surgeries and procedures include iliac artery stenting, cholecystectomy, and carotid endarterectomy.   Social history: 36 year history of smoking pack a day. Nondrinker.  Family history: Reviewed.  Review of systems:Could not be completed due to dementia. Patient denies any active complaints. She gave the president as Obama. Date given as "[birth year]" (this is her birthday). The same date was given when I asked her daughter's birth date According to the daughter the patient's dementia has been progressive. Intermittently she has yelled at family members but exhibited no physical abuse. The family describes loose stools without frank diarrhea for the last month. The family was unaware of the CT abdominal findings and the pulmonary nodule.  These were discussed with them.  Physical exam:  Pertinent or positive findings: She appears chronically ill with markedly weathered facies. Ptosis greater on the right than the left. There is a small,thick eschar over the tip of the nose. She has only 4 mandibular teeth with thick plaque formation. There is localized hirsutism of the left chin. Heart rhythm is regular, there is a grade 1 systolic murmur. Breath sounds are diffusely decreased. Limbs are atrophic and weak to opposition. Pedal pulses are decreased. She has intermittent jerking of the upper body and limbs..  General appearance:no acute distress , increased work of breathing is present.   Lymphatic: No lymphadenopathy about the head, neck, axilla . Eyes: No conjunctival inflammation or lid  edema is present. There is no scleral icterus. Ears:  External ear exam shows no significant lesions or deformities.   Nose:  External nasal examination shows no deformity or inflammation. Nasal mucosa are pink and moist without lesions ,exudates Oral exam: lips and gums are healthy appearing.There is no oropharyngeal erythema or exudate . Neck:  No thyromegaly, masses, tenderness noted.    Heart:  Normal rate and regular rhythm. S1 and S2 normal without gallop,  click, rub .  Lungs: without wheezes, rhonchi,rales , rubs. Abdomen:Bowel sounds are normal. Abdomen is soft and nontender with no organomegaly, hernias,masses. GU: deferred  Extremities:  No cyanosis, clubbing,edema  Neurologic exam : Balance,Rhomberg,finger to nose testing could not be completed due to clinical state Skin: Warm & dry w/o tenting. No significant rash.  See clinical summary under each active problem in the Problem List with associated updated therapeutic plan

## 2017-01-02 LAB — BASIC METABOLIC PANEL
BUN: 63 — AB (ref 4–21)
Creatinine: 2.7 — AB (ref 0.5–1.1)
GLUCOSE: 41
Potassium: 4.4 (ref 3.4–5.3)
Potassium: 6.3 — AB (ref 3.4–5.3)
Sodium: 143 (ref 137–147)

## 2017-01-03 LAB — BASIC METABOLIC PANEL
BUN: 60 — AB (ref 4–21)
Creatinine: 2.6 — AB (ref 0.5–1.1)
Glucose: 85
POTASSIUM: 4.9 (ref 3.4–5.3)
SODIUM: 147 (ref 137–147)

## 2017-01-04 ENCOUNTER — Encounter: Payer: Self-pay | Admitting: Adult Health

## 2017-01-04 ENCOUNTER — Non-Acute Institutional Stay (SKILLED_NURSING_FACILITY): Payer: Medicare Other | Admitting: Adult Health

## 2017-01-04 DIAGNOSIS — N179 Acute kidney failure, unspecified: Secondary | ICD-10-CM

## 2017-01-04 DIAGNOSIS — N189 Chronic kidney disease, unspecified: Secondary | ICD-10-CM

## 2017-01-04 DIAGNOSIS — D72829 Elevated white blood cell count, unspecified: Secondary | ICD-10-CM | POA: Diagnosis not present

## 2017-01-04 DIAGNOSIS — I5032 Chronic diastolic (congestive) heart failure: Secondary | ICD-10-CM

## 2017-01-04 DIAGNOSIS — E875 Hyperkalemia: Secondary | ICD-10-CM

## 2017-01-04 NOTE — Progress Notes (Signed)
DATE:  01/04/2017   MRN:  [id number]  BIRTHDAY: Jan 30, 1951  Facility:  N[facility]ing Home Location:  [facility] Room Number: 111-A  LEVEL OF CARE:  SNF (31)  Contact Information    Name Relation Home Work [place] [phone]     [name] Daughter [phone]  [phone]       Code Status History    Date Active Date Inactive Code Status Order ID Comments User Context   12/27/2016 16:38 12/30/2016 01:02 Full Code [id number]  [name], NP ED   06/21/2016 15:24 06/24/2016 18:23 DNR [id number]  Sela Hilding, MD Inpatient   04/05/2015 15:18 04/08/2015 00:21 DNR [id number]  Pershing Proud, NP Inpatient   03/29/2015 15:28 04/05/2015 15:18 Full Code [id number]  Samella Parr, NP Inpatient   03/20/2015 00:56 03/26/2015 17:45 Full Code [id number]  Reubin Milan, MD Inpatient   03/04/2015 14:09 03/12/2015 18:10 Full Code [id number]  [name], NP ED   01/23/2015 09:04 01/28/2015 16:51 Full Code [id number]  [name], MD Inpatient   01/21/2015 23:06 01/23/2015 09:04 Full Code [id number]  [name], DO ED   12/24/2014 17:22 12/26/2014 18:14 Full Code [id number]  [name], PA-C Inpatient   11/07/2014 09:51 11/09/2014 21:17 Full Code [id number]  [name], MD ED   09/08/2012 03:54 09/10/2012 16:14 Full Code [id number]  [name], MD Inpatient   01/25/2012 16:46 01/28/2012 16:06 Full Code [id number]  Ludwig Lean, RN Inpatient   11/03/2011 20:16 11/05/2011 15:03 Full Code [id number]  [name], RN ED       Chief Complaint  Patient presents with  . Acute Visit    Patient with continued abnormal labs (CO2 and potassium)    HISTORY OF PRESENT ILLNESS:  This is a 66-YO female seen for an acute visit secondary to persistently abnormal labs.  She is a short-term rehabilitation resident of [facility].  She has a PMH of AKI superimposed on CKD, DM with complication,  anemia, anxiety/depression, history of CVA, COPD, renal artery stenosis, CAD, chronic diastolic CHF, essential HTN, Lewy body dementia, lung nodule, and hypokalemia.   She has been admitted to [facility] on 12/29/16 from the hospital with acute kidney injury superimposed on chronic kidney disease. She had boil on left groin S/P I/D on 11/6. She also had rashes on left groin which is thought to be consistent with fungal intertrigo. She was started on Clindamycin. It was noted on 01/02/17 that her wbc was 14.5. Clindamycin was extended for 5 more days. She was treated for elevated K 6.3 on 01/02/17. She was given Kayexalate X 2. Noted creatinine is on the down trend from 2.69 to 2.61. CO2 is consistently low at 10. Review of discharge summary indicates that she has a right renal mass with 7 mm stone on CT scan and is scheduled to follow-up with urologist, Dr. Junious Silk on 02/03/17. She was seen in the room today with husband at bedside. She verbalized feeling better everyday and looking forward to going home. No reported fever.     PAST MEDICAL HISTORY:  Past Medical History:  Diagnosis Date  . Abscess, groin 12/2016  . Anemia, chronic disease 05/16/2008   Qualifier: Diagnosis of  By: Percival Spanish, MD, Farrel Gordon    . Bradycardia    a. Limiting BB titration.  [name] CAD (coronary artery disease)    a. NSTEMI 2007 in setting of anemia;  LHC 50% LM, 60% LAD, 30% RI, occluded RCA with L-R collaterals, normal EF, treated medically; B. LHC 01/2015 prox-mid RCA 100%, ostial to prox LAD 70%, LM 50%, mid-distal circ 90%-not surgical candidate due to renal and pulmonary issues).  . Carotid artery disease ([place])    a. Duplex 12/2014: left CEA patent with elevated velocities, 60-79% RICA (stable over serial exams), 56-21% LICA stenosis (stable over serial exams), 50% LECA, elevated velocities in bilateral subclavian arteries.  . Chronic combined systolic (congestive) and diastolic (congestive)  heart failure ([place])    a. 06/2016 Echo: EF 40-45%, mod MR, mildly dil LA/RA, mod TR, PASP 69mmHg.  . CKD (chronic kidney disease), stage IV ([place])   . COPD (chronic obstructive pulmonary disease) (HCC)    home oxygen  . Dementia   . Depression   . Diabetes mellitus   . Diabetic retinopathy ([place])   . Essential hypertension   . GI AVM (gastrointestinal arteriovenous vascular malformation)    small bowel  . Hyperlipidemia   . Hypertensive heart disease with congestive heart failure ([place])   . Iron deficiency anemia    a. ? chronic GI blood loss-->plavix d/c'd 06/2016.  [name] Peripheral vascular disease ([place])    a.s /p left common iliac and external iliac artery stents. b. H/o subclavian, mesenteric, and celiac artery stenosis (see below).  . Protein-calorie malnutrition ([place]) 01/24/2015  . Pulmonary hypertension ([place])    a. 06/2016 Echo: PASP 22mmHg.  [name] Renal artery stenosis ([place])    a. Duplex 2014: no obvious evidence of hemodynamically significant stenosis >60%, bilateral intrarenal arteries exhibit absent diastolic flow. There is evidence of elevated velocities of the mid aorta. 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  . Tobacco abuse   . 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 Dieaulafoy's lesion 01/25/2012     CURRENT MEDICATIONS: Reviewed    Medication List        Accurate as of 01/04/17  4:29 PM. Always use your most recent med list.          acetaminophen 325 MG tablet Commonly known as:  TYLENOL   albuterol 108 (90 Base) MCG/ACT inhaler Commonly known as:  PROVENTIL HFA;VENTOLIN HFA Inhale 2 puffs into the lungs every 6 (six) hours as needed for wheezing or shortness of breath.   aspirin EC 81 MG tablet   atorvastatin 80 MG tablet Commonly known as:  LIPITOR TAKE 1 TABLET BY MOUTH  DAILY AT 6 PM.   carvedilol 6.25 MG tablet Commonly known as:  COREG TAKE 1 TABLET BY MOUTH TWO  TIMES DAILY WITH A MEAL   clindamycin 300 MG capsule Commonly  known as:  CLEOCIN Take 1 capsule (300 mg total) every 8 (eight) hours by mouth.   donepezil 10 MG tablet Commonly known as:  ARICEPT Take 1 tablet (10 mg total) by mouth at bedtime.   ferrous sulfate 325 (65 FE) MG tablet Take 1 tablet (325 mg total) by mouth 2 (two) times daily with a meal.   fish oil-omega-3 fatty acids 1000 MG capsule   furosemide 80 MG tablet Commonly known as:  LASIX   hydrALAZINE 50 MG tablet Commonly known as:  APRESOLINE TAKE 1 TABLET BY MOUTH 3  TIMES DAILY   nitroGLYCERIN 0.4 MG SL tablet Commonly known as:  NITROSTAT   NUTRITIONAL SUPPLEMENT Liqd   pantoprazole 40 MG tablet Commonly known as:  PROTONIX TAKE 1 TABLET BY MOUTH  DAILY   potassium chloride 10 MEQ tablet Commonly known  as:  K-DUR   saccharomyces boulardii 250 MG capsule Commonly known as:  FLORASTOR   sertraline 50 MG tablet Commonly known as:  ZOLOFT Take 1 tablet (50 mg total) by mouth daily.        No Known Allergies   REVIEW OF SYSTEMS:  GENERAL: no change in appetite, no fatigue, no weight changes, no fever, chills or weakness MOUTH and THROAT: Denies oral discomfort RESPIRATORY: no cough, SOB, DOE, wheezing, hemoptysis CARDIAC: no chest pain, edema or palpitations GI: no abdominal pain, diarrhea, constipation, heart burn, nausea or vomiting GU: Denies dysuria, frequency, hematuria, incontinence, or discharge PSYCHIATRIC: Denies feeling of depression or anxiety. No report of hallucinations, insomnia, paranoia, or agitation    PHYSICAL EXAMINATION  GENERAL APPEARANCE: Well nourished. In no acute distress. Normal body habitus SKIN:  Left groin has dry scab MOUTH and THROAT: Lips are without lesions. Oral mucosa is moist and without lesions. RESPIRATORY: breathing is even & unlabored, BS CTAB CARDIAC: RRR, no murmur,no extra heart sounds, no edema GI: abdomen soft, normal BS, no masses, no tenderness, no hepatomegaly, no splenomegaly EXTREMITIES: Able to move X  4 extremities PSYCHIATRIC: Alert to self, disoriented to time and place. Affect and behavior are appropriate    LABS/RADIOLOGY: Labs reviewed:  Basic Metabolic Panel: Recent Labs    06/21/16 1624  06/23/16 0248  12/27/16 1054 12/27/16 1725 12/28/16 0231 12/29/16 0432 01/02/17 01/03/17  NA  --    < > 136   < > 138  --  140 142 143 147  K  --    < > 2.8*   < > 3.3*  --  3.1* 4.4 6.3*  4.4 4.9  CL  --    < > 102   < > 116*  --  120* 124*  --   --   CO2  --    < > 23   < > 10*  --  11* 11*  --   --   GLUCOSE  --    < > 90   < > 93  --  59* 111*  --   --   BUN  --    < > 71*   < > 68*  --  67* 59* 63* 60*  CREATININE  --    < > 3.11*   < > 3.81*  --  3.59* 3.32* 2.7* 2.6*  CALCIUM  --    < > 7.6*   < > 8.6*  --  8.2* 8.6*  --   --   MG 2.1  --  1.7  --   --  1.8  --   --   --   --   PHOS 5.5*  --   --   --   --   --   --   --   --   --    < > = values in this interval not displayed.   Liver Function Tests: Recent Labs    06/24/16 0339 06/29/16 1049 12/20/16 1900  AST 51* 15 19  ALT 116* 34* 10*  ALKPHOS 83 90 80  BILITOT 0.9 0.2 0.6  PROT 4.9* 5.4* 6.0*  ALBUMIN 2.1* 3.0* 2.7*   Recent Labs    06/21/16 1030 06/22/16 0947  LIPASE 52* 33   CBC: Recent Labs    07/14/16 1041  12/27/16 1054 12/28/16 0231 12/29/16 0432  WBC 5.4   < > 12.0* 8.8 8.5  NEUTROABS 3.9  --   --   --  7.3  HGB 11.8   < > 10.5* 9.7* 10.0*  HCT 38.7   < > 32.1* 29.4* 30.1*  MCV 85   < > 87.0 86.0 87.0  PLT 229   < > 279 269 320   < > = values in this interval not displayed.   Lipid Panel: Recent Labs    09/20/16 0859  HDL 46   Cardiac Enzymes: Recent Labs    06/22/16 0545 06/22/16 0947 06/22/16 1444  TROPONINI 0.43* 0.41* 0.34*   CBG: Recent Labs    12/29/16 1157 12/29/16 1649 12/29/16 2010  GLUCAP 224* 214* 164*      Ct Renal Stone Study  Result Date: 12/20/2016 CLINICAL DATA:  66 year old female with hematuria. EXAM: CT ABDOMEN AND PELVIS WITHOUT CONTRAST  TECHNIQUE: Multidetector CT imaging of the abdomen and pelvis was performed following the standard protocol without IV contrast. COMPARISON:  Renal ultrasound dated 01/05/2016 and CT dated 09/07/2012 the FINDINGS: Evaluation of this exam is limited in the absence of intravenous contrast. Lower chest: There is a 10 x 8 mm subpleural nodule at the right lung base (series 4, image 9). A 3 mm nodule in the lateral right lower lobe (series 4, image 4) may represent a calcified granuloma. There is coronary vascular calcification. There is no intra-abdominal free air. Small free fluid within the pelvis. Hepatobiliary: The liver is unremarkable. There is cholecystectomy changes. Pancreas: Unremarkable. No pancreatic ductal dilatation or surrounding inflammatory changes. Spleen: Normal in size without focal abnormality. Adrenals/Urinary Tract: The adrenal glands are unremarkable. There is an extrarenal pelvis on the right with mild pelviectasis. High attenuating content within the right renal collecting system and pelvis most consistent with blood product. This is of uncertain etiology but concerning for underlying neoplasm. Evaluation is very limited on this noncontrast CT. Further evaluation with CT urography or direct visualization with ureteroscopy recommended. There is no hydronephrosis or nephrolithiasis. There is mild right perinephric fluid. A 7 mm stone in the right pelvis (series 2, image 66) appears to be adjacent to the ureter and not in the right ureter. There is a 15 mm hypodense lesion in the upper pole of the left kidney which is not well characterized but likely represents a cyst. A subcentimeter high attenuating lesion in the inferior pole of the left kidney is also not characterized but may represent a complex/hemorrhagic cyst. There is no hydronephrosis or nephrolithiasis on the left. The left ureter is unremarkable. Stomach/Bowel: There is no evidence of bowel obstruction or active inflammation.  Appendectomy. Vascular/Lymphatic: There is advanced aortoiliac atherosclerotic disease. There is extensive atherosclerotic calcification of the mesenteric vasculature as well as bilateral renovascular calcification. The IVC is grossly unremarkable. No portal venous gas. There is no adenopathy. Reproductive: The uterus is retroflexed. The ovaries are grossly unremarkable. Other: There is diffuse subcutaneous edema and stranding. No fluid collection. Musculoskeletal: Osteopenia with degenerative changes of the spine. Grade 1 L4-L5 anterolisthesis. IMPRESSION: 1. Mild caliectasis of the right renal pelvis containing blood product. This is of indeterminate etiology but underlying mass is not excluded. Further evaluation with CT urography or direct visualization with ureteroscopy recommended. There is no hydronephrosis or nephrolithiasis on either side. 2. **An incidental finding of potential clinical significance has been found. A 10 mm right lung base subpleural nodule. Consider one of the following in 3 months for both low-risk and high-risk individuals: (a) repeat chest CT, (b) follow-up PET-CT, or (c) tissue sampling. This recommendation follows the consensus statement: Guidelines for Management of Incidental Pulmonary Nodules  Detected on CT Images: From the Fleischner Society 2017; Radiology 2017; [phone].** 3. Advanced atherosclerotic disease of the aorta Aortic Atherosclerosis (ICD10-I70.0). Atherosclerotic disease of the mesenteric vasculature as well as renovascular calcification. 4. No bowel obstruction or active inflammation. Electronically Signed   By: Anner Crete M.D.   On: 12/20/2016 20:24    ASSESSMENT/PLAN:  1. Acute kidney injury superimposed on chronic kidney disease ([place]) - creatinine on the downward trend, will order nephrology consult, BMP on 01/06/17   2. Leukocytosis, unspecified type - wbc 14.5, no fever, and left groin abscess is healed, was put on additional Clindamycin X 5  more days,  will re-check CBC on 01/06/17   3. Chronic diastolic heart failure (HCC) - no SOB, decrease Lasix from 80 mg BID to 40 mg BID, weigh daily and notify provider for weight gain >=3 lbs   4. Hyperkalemia -  Latest K 4.9, was given Kayexalate X 2, will repeat BMP on 01/06/17     Bryan Goin C. [place] - NP    [employer] [phone]

## 2017-01-04 NOTE — Progress Notes (Signed)
Potassium level corrected.

## 2017-01-09 DIAGNOSIS — L03314 Cellulitis of groin: Secondary | ICD-10-CM | POA: Diagnosis not present

## 2017-01-09 LAB — BASIC METABOLIC PANEL
BUN: 69 — AB (ref 4–21)
CREATININE: 3.7 — AB (ref 0.5–1.1)
GLUCOSE: 189
POTASSIUM: 4 (ref 3.4–5.3)
Sodium: 138 (ref 137–147)

## 2017-01-09 LAB — CBC AND DIFFERENTIAL
HEMATOCRIT: 32 — AB (ref 36–46)
Hemoglobin: 10.6 — AB (ref 12.0–16.0)
Neutrophils Absolute: 15
Platelets: 259 (ref 150–399)
WBC: 16.9

## 2017-01-10 ENCOUNTER — Observation Stay (HOSPITAL_COMMUNITY): Payer: Medicare Other

## 2017-01-10 ENCOUNTER — Non-Acute Institutional Stay (SKILLED_NURSING_FACILITY): Payer: Medicare Other | Admitting: Adult Health

## 2017-01-10 ENCOUNTER — Inpatient Hospital Stay (HOSPITAL_COMMUNITY)
Admission: EM | Admit: 2017-01-10 | Discharge: 2017-01-17 | DRG: 682 | Disposition: A | Discharge status: Skilled Nursing Facility | Payer: Medicare Other | Attending: Internal Medicine | Admitting: Internal Medicine

## 2017-01-10 ENCOUNTER — Encounter (HOSPITAL_COMMUNITY): Payer: Self-pay

## 2017-01-10 ENCOUNTER — Other Ambulatory Visit: Payer: Self-pay

## 2017-01-10 ENCOUNTER — Encounter: Payer: Self-pay | Admitting: Adult Health

## 2017-01-10 ENCOUNTER — Ambulatory Visit: Payer: Medicare Other | Admitting: Family Medicine

## 2017-01-10 DIAGNOSIS — N170 Acute kidney failure with tubular necrosis: Principal | ICD-10-CM | POA: Diagnosis present

## 2017-01-10 DIAGNOSIS — D508 Other iron deficiency anemias: Secondary | ICD-10-CM | POA: Diagnosis not present

## 2017-01-10 DIAGNOSIS — Z79891 Long term (current) use of opiate analgesic: Secondary | ICD-10-CM

## 2017-01-10 DIAGNOSIS — J8410 Pulmonary fibrosis, unspecified: Secondary | ICD-10-CM | POA: Diagnosis present

## 2017-01-10 DIAGNOSIS — T454X5A Adverse effect of iron and its compounds, initial encounter: Secondary | ICD-10-CM | POA: Diagnosis not present

## 2017-01-10 DIAGNOSIS — E11319 Type 2 diabetes mellitus with unspecified diabetic retinopathy without macular edema: Secondary | ICD-10-CM | POA: Diagnosis present

## 2017-01-10 DIAGNOSIS — E860 Dehydration: Secondary | ICD-10-CM | POA: Diagnosis present

## 2017-01-10 DIAGNOSIS — Z8719 Personal history of other diseases of the digestive system: Secondary | ICD-10-CM

## 2017-01-10 DIAGNOSIS — K55059 Acute (reversible) ischemia of intestine, part and extent unspecified: Secondary | ICD-10-CM | POA: Diagnosis present

## 2017-01-10 DIAGNOSIS — R112 Nausea with vomiting, unspecified: Secondary | ICD-10-CM

## 2017-01-10 DIAGNOSIS — F0390 Unspecified dementia without behavioral disturbance: Secondary | ICD-10-CM | POA: Diagnosis present

## 2017-01-10 DIAGNOSIS — I5032 Chronic diastolic (congestive) heart failure: Secondary | ICD-10-CM

## 2017-01-10 DIAGNOSIS — I2510 Atherosclerotic heart disease of native coronary artery without angina pectoris: Secondary | ICD-10-CM | POA: Diagnosis present

## 2017-01-10 DIAGNOSIS — N3001 Acute cystitis with hematuria: Secondary | ICD-10-CM | POA: Diagnosis not present

## 2017-01-10 DIAGNOSIS — D123 Benign neoplasm of transverse colon: Secondary | ICD-10-CM | POA: Diagnosis not present

## 2017-01-10 DIAGNOSIS — D500 Iron deficiency anemia secondary to blood loss (chronic): Secondary | ICD-10-CM | POA: Diagnosis not present

## 2017-01-10 DIAGNOSIS — I272 Pulmonary hypertension, unspecified: Secondary | ICD-10-CM | POA: Diagnosis not present

## 2017-01-10 DIAGNOSIS — Z66 Do not resuscitate: Secondary | ICD-10-CM | POA: Diagnosis present

## 2017-01-10 DIAGNOSIS — Z9049 Acquired absence of other specified parts of digestive tract: Secondary | ICD-10-CM

## 2017-01-10 DIAGNOSIS — N189 Chronic kidney disease, unspecified: Secondary | ICD-10-CM | POA: Diagnosis not present

## 2017-01-10 DIAGNOSIS — N179 Acute kidney failure, unspecified: Secondary | ICD-10-CM | POA: Diagnosis not present

## 2017-01-10 DIAGNOSIS — E876 Hypokalemia: Secondary | ICD-10-CM | POA: Diagnosis present

## 2017-01-10 DIAGNOSIS — F0280 Dementia in other diseases classified elsewhere without behavioral disturbance: Secondary | ICD-10-CM | POA: Diagnosis present

## 2017-01-10 DIAGNOSIS — R1013 Epigastric pain: Secondary | ICD-10-CM | POA: Diagnosis not present

## 2017-01-10 DIAGNOSIS — E1151 Type 2 diabetes mellitus with diabetic peripheral angiopathy without gangrene: Secondary | ICD-10-CM | POA: Diagnosis not present

## 2017-01-10 DIAGNOSIS — D62 Acute posthemorrhagic anemia: Secondary | ICD-10-CM | POA: Diagnosis not present

## 2017-01-10 DIAGNOSIS — E785 Hyperlipidemia, unspecified: Secondary | ICD-10-CM | POA: Diagnosis present

## 2017-01-10 DIAGNOSIS — N281 Cyst of kidney, acquired: Secondary | ICD-10-CM | POA: Diagnosis present

## 2017-01-10 DIAGNOSIS — N200 Calculus of kidney: Secondary | ICD-10-CM | POA: Diagnosis present

## 2017-01-10 DIAGNOSIS — D72829 Elevated white blood cell count, unspecified: Secondary | ICD-10-CM

## 2017-01-10 DIAGNOSIS — F329 Major depressive disorder, single episode, unspecified: Secondary | ICD-10-CM | POA: Diagnosis present

## 2017-01-10 DIAGNOSIS — T782XXA Anaphylactic shock, unspecified, initial encounter: Secondary | ICD-10-CM | POA: Diagnosis not present

## 2017-01-10 DIAGNOSIS — K921 Melena: Secondary | ICD-10-CM | POA: Diagnosis not present

## 2017-01-10 DIAGNOSIS — D509 Iron deficiency anemia, unspecified: Secondary | ICD-10-CM | POA: Diagnosis present

## 2017-01-10 DIAGNOSIS — E11649 Type 2 diabetes mellitus with hypoglycemia without coma: Secondary | ICD-10-CM | POA: Diagnosis present

## 2017-01-10 DIAGNOSIS — F17210 Nicotine dependence, cigarettes, uncomplicated: Secondary | ICD-10-CM | POA: Diagnosis present

## 2017-01-10 DIAGNOSIS — N184 Chronic kidney disease, stage 4 (severe): Secondary | ICD-10-CM

## 2017-01-10 DIAGNOSIS — L02214 Cutaneous abscess of groin: Secondary | ICD-10-CM | POA: Diagnosis present

## 2017-01-10 DIAGNOSIS — G3183 Dementia with Lewy bodies: Secondary | ICD-10-CM | POA: Diagnosis present

## 2017-01-10 DIAGNOSIS — I701 Atherosclerosis of renal artery: Secondary | ICD-10-CM | POA: Diagnosis not present

## 2017-01-10 DIAGNOSIS — Y92230 Patient room in hospital as the place of occurrence of the external cause: Secondary | ICD-10-CM | POA: Diagnosis not present

## 2017-01-10 DIAGNOSIS — K633 Ulcer of intestine: Secondary | ICD-10-CM | POA: Diagnosis not present

## 2017-01-10 DIAGNOSIS — D649 Anemia, unspecified: Secondary | ICD-10-CM | POA: Diagnosis present

## 2017-01-10 DIAGNOSIS — R1084 Generalized abdominal pain: Secondary | ICD-10-CM | POA: Diagnosis not present

## 2017-01-10 DIAGNOSIS — K120 Recurrent oral aphthae: Secondary | ICD-10-CM | POA: Diagnosis present

## 2017-01-10 DIAGNOSIS — E119 Type 2 diabetes mellitus without complications: Secondary | ICD-10-CM | POA: Diagnosis not present

## 2017-01-10 DIAGNOSIS — I959 Hypotension, unspecified: Secondary | ICD-10-CM | POA: Diagnosis present

## 2017-01-10 DIAGNOSIS — R109 Unspecified abdominal pain: Secondary | ICD-10-CM | POA: Diagnosis not present

## 2017-01-10 DIAGNOSIS — Z8673 Personal history of transient ischemic attack (TIA), and cerebral infarction without residual deficits: Secondary | ICD-10-CM

## 2017-01-10 DIAGNOSIS — J449 Chronic obstructive pulmonary disease, unspecified: Secondary | ICD-10-CM | POA: Diagnosis present

## 2017-01-10 DIAGNOSIS — E46 Unspecified protein-calorie malnutrition: Secondary | ICD-10-CM | POA: Diagnosis not present

## 2017-01-10 DIAGNOSIS — K5520 Angiodysplasia of colon without hemorrhage: Secondary | ICD-10-CM | POA: Diagnosis present

## 2017-01-10 DIAGNOSIS — I252 Old myocardial infarction: Secondary | ICD-10-CM

## 2017-01-10 DIAGNOSIS — E440 Moderate protein-calorie malnutrition: Secondary | ICD-10-CM | POA: Diagnosis present

## 2017-01-10 DIAGNOSIS — T886XXA Anaphylactic reaction due to adverse effect of correct drug or medicament properly administered, initial encounter: Secondary | ICD-10-CM | POA: Diagnosis not present

## 2017-01-10 DIAGNOSIS — Z8249 Family history of ischemic heart disease and other diseases of the circulatory system: Secondary | ICD-10-CM

## 2017-01-10 DIAGNOSIS — I5042 Chronic combined systolic (congestive) and diastolic (congestive) heart failure: Secondary | ICD-10-CM | POA: Diagnosis present

## 2017-01-10 DIAGNOSIS — Z7982 Long term (current) use of aspirin: Secondary | ICD-10-CM

## 2017-01-10 DIAGNOSIS — E1122 Type 2 diabetes mellitus with diabetic chronic kidney disease: Secondary | ICD-10-CM | POA: Diagnosis not present

## 2017-01-10 DIAGNOSIS — Z1639 Resistance to other specified antimicrobial drug: Secondary | ICD-10-CM | POA: Diagnosis present

## 2017-01-10 DIAGNOSIS — I130 Hypertensive heart and chronic kidney disease with heart failure and stage 1 through stage 4 chronic kidney disease, or unspecified chronic kidney disease: Secondary | ICD-10-CM | POA: Diagnosis present

## 2017-01-10 DIAGNOSIS — T782XXS Anaphylactic shock, unspecified, sequela: Secondary | ICD-10-CM | POA: Diagnosis not present

## 2017-01-10 DIAGNOSIS — D520 Dietary folate deficiency anemia: Secondary | ICD-10-CM | POA: Diagnosis not present

## 2017-01-10 DIAGNOSIS — L304 Erythema intertrigo: Secondary | ICD-10-CM | POA: Diagnosis present

## 2017-01-10 DIAGNOSIS — I700 Atherosclerosis of aorta: Secondary | ICD-10-CM | POA: Diagnosis present

## 2017-01-10 DIAGNOSIS — E872 Acidosis: Secondary | ICD-10-CM | POA: Diagnosis not present

## 2017-01-10 DIAGNOSIS — R195 Other fecal abnormalities: Secondary | ICD-10-CM

## 2017-01-10 DIAGNOSIS — Q2730 Arteriovenous malformation, site unspecified: Secondary | ICD-10-CM | POA: Diagnosis not present

## 2017-01-10 DIAGNOSIS — L500 Allergic urticaria: Secondary | ICD-10-CM | POA: Diagnosis not present

## 2017-01-10 DIAGNOSIS — M8580 Other specified disorders of bone density and structure, unspecified site: Secondary | ICD-10-CM | POA: Diagnosis present

## 2017-01-10 DIAGNOSIS — B961 Klebsiella pneumoniae [K. pneumoniae] as the cause of diseases classified elsewhere: Secondary | ICD-10-CM | POA: Diagnosis present

## 2017-01-10 DIAGNOSIS — N2889 Other specified disorders of kidney and ureter: Secondary | ICD-10-CM | POA: Diagnosis present

## 2017-01-10 DIAGNOSIS — M4310 Spondylolisthesis, site unspecified: Secondary | ICD-10-CM | POA: Diagnosis present

## 2017-01-10 DIAGNOSIS — E538 Deficiency of other specified B group vitamins: Secondary | ICD-10-CM | POA: Diagnosis present

## 2017-01-10 DIAGNOSIS — Z1611 Resistance to penicillins: Secondary | ICD-10-CM | POA: Diagnosis present

## 2017-01-10 LAB — COMPREHENSIVE METABOLIC PANEL
ALBUMIN: 2.1 g/dL — AB (ref 3.5–5.0)
ALT: 16 U/L (ref 14–54)
ANION GAP: 10 (ref 5–15)
AST: 20 U/L (ref 15–41)
Alkaline Phosphatase: 93 U/L (ref 38–126)
BILIRUBIN TOTAL: 0.6 mg/dL (ref 0.3–1.2)
BUN: 70 mg/dL — ABNORMAL HIGH (ref 6–20)
CHLORIDE: 115 mmol/L — AB (ref 101–111)
CO2: 10 mmol/L — ABNORMAL LOW (ref 22–32)
Calcium: 8.1 mg/dL — ABNORMAL LOW (ref 8.9–10.3)
Creatinine, Ser: 3.71 mg/dL — ABNORMAL HIGH (ref 0.44–1.00)
GFR calc Af Amer: 14 mL/min — ABNORMAL LOW (ref >60)
GFR, EST NON AFRICAN AMERICAN: 12 mL/min — AB (ref >60)
Glucose, Bld: 125 mg/dL — ABNORMAL HIGH (ref 65–99)
POTASSIUM: 4 mmol/L (ref 3.5–5.1)
Sodium: 135 mmol/L (ref 135–145)
TOTAL PROTEIN: 5.3 g/dL — AB (ref 6.5–8.1)

## 2017-01-10 LAB — CBC WITH DIFFERENTIAL/PLATELET
BASOS ABS: 0 10*3/uL (ref 0.0–0.1)
Basophils Relative: 0 %
Eosinophils Absolute: 0 10*3/uL (ref 0.0–0.7)
Eosinophils Relative: 0 %
HEMATOCRIT: 29.2 % — AB (ref 36.0–46.0)
Hemoglobin: 9.7 g/dL — ABNORMAL LOW (ref 12.0–15.0)
LYMPHS ABS: 0.4 10*3/uL — AB (ref 0.7–4.0)
LYMPHS PCT: 3 %
MCH: 29.1 pg (ref 26.0–34.0)
MCHC: 33.2 g/dL (ref 30.0–36.0)
MCV: 87.7 fL (ref 78.0–100.0)
Monocytes Absolute: 0.8 10*3/uL (ref 0.1–1.0)
Monocytes Relative: 4 %
NEUTROS ABS: 16.5 10*3/uL — AB (ref 1.7–7.7)
Neutrophils Relative %: 93 %
PLATELETS: 270 10*3/uL (ref 150–400)
RBC: 3.33 MIL/uL — AB (ref 3.87–5.11)
RDW: 15.8 % — ABNORMAL HIGH (ref 11.5–15.5)
WBC: 17.8 10*3/uL — AB (ref 4.0–10.5)

## 2017-01-10 LAB — LIPASE, BLOOD: Lipase: 70 U/L — ABNORMAL HIGH (ref 11–51)

## 2017-01-10 MED ORDER — SODIUM CHLORIDE 0.9% FLUSH
3.0000 mL | Freq: Two times a day (BID) | INTRAVENOUS | Status: DC
  Administered 2017-01-13 – 2017-01-16 (×6): 3 mL via INTRAVENOUS

## 2017-01-10 MED ORDER — ONDANSETRON HCL 4 MG PO TABS: 4.0000 mg | ORAL_TABLET | Freq: Four times a day (QID) | ORAL | Status: DC | PRN

## 2017-01-10 MED ORDER — DONEPEZIL HCL 10 MG PO TABS
10.0000 mg | ORAL_TABLET | Freq: Every day | ORAL | Status: DC
  Administered 2017-01-10 – 2017-01-16 (×7): 10 mg via ORAL
  Filled 2017-01-10 (×2): qty 1
  Filled 2017-01-10 (×2): qty 2
  Filled 2017-01-10 (×3): qty 1

## 2017-01-10 MED ORDER — PROMETHAZINE HCL 25 MG PO TABS: 25.0000 mg | ORAL_TABLET | Freq: Four times a day (QID) | ORAL | Status: DC | PRN

## 2017-01-10 MED ORDER — ATORVASTATIN CALCIUM 80 MG PO TABS: 80.0000 mg | ORAL_TABLET | Freq: Every day | ORAL | Status: DC

## 2017-01-10 MED ORDER — OMEGA-3 FATTY ACIDS 1000 MG PO CAPS: 1.0000 g | ORAL_CAPSULE | Freq: Three times a day (TID) | ORAL | Status: DC

## 2017-01-10 MED ORDER — SODIUM CHLORIDE 0.9 % IV SOLN
INTRAVENOUS | Status: DC
  Administered 2017-01-10: 17:00:00 via INTRAVENOUS

## 2017-01-10 MED ORDER — TRAMADOL HCL 50 MG PO TABS
50.0000 mg | ORAL_TABLET | Freq: Two times a day (BID) | ORAL | Status: DC
  Administered 2017-01-10 – 2017-01-17 (×12): 50 mg via ORAL
  Filled 2017-01-10 (×13): qty 1

## 2017-01-10 MED ORDER — SERTRALINE HCL 50 MG PO TABS
50.0000 mg | ORAL_TABLET | Freq: Every day | ORAL | Status: DC
  Administered 2017-01-11 – 2017-01-17 (×7): 50 mg via ORAL
  Filled 2017-01-10 (×7): qty 1

## 2017-01-10 MED ORDER — ASPIRIN EC 81 MG PO TBEC
81.0000 mg | DELAYED_RELEASE_TABLET | Freq: Every day | ORAL | Status: DC
  Administered 2017-01-11 – 2017-01-17 (×7): 81 mg via ORAL
  Filled 2017-01-10 (×7): qty 1

## 2017-01-10 MED ORDER — ENSURE ENLIVE PO LIQD
237.0000 mL | Freq: Two times a day (BID) | ORAL | Status: DC
  Administered 2017-01-11: 237 mL via ORAL

## 2017-01-10 MED ORDER — ATORVASTATIN CALCIUM 40 MG PO TABS
80.0000 mg | ORAL_TABLET | Freq: Every day | ORAL | Status: DC
  Administered 2017-01-10 – 2017-01-17 (×8): 80 mg via ORAL
  Filled 2017-01-10 (×8): qty 2

## 2017-01-10 MED ORDER — SODIUM CHLORIDE 0.9 % IV BOLUS (SEPSIS)
1000.0000 mL | Freq: Once | INTRAVENOUS | Status: AC
  Administered 2017-01-10: 1000 mL via INTRAVENOUS

## 2017-01-10 MED ORDER — IOPAMIDOL (ISOVUE-300) INJECTION 61%
INTRAVENOUS | Status: AC
  Filled 2017-01-10: qty 30

## 2017-01-10 MED ORDER — POTASSIUM CHLORIDE ER 10 MEQ PO TBCR
10.0000 meq | EXTENDED_RELEASE_TABLET | Freq: Every day | ORAL | Status: DC
  Filled 2017-01-10: qty 1

## 2017-01-10 MED ORDER — PANTOPRAZOLE SODIUM 40 MG PO TBEC
40.0000 mg | DELAYED_RELEASE_TABLET | Freq: Every day | ORAL | Status: DC
  Administered 2017-01-11 – 2017-01-17 (×7): 40 mg via ORAL
  Filled 2017-01-10 (×7): qty 1

## 2017-01-10 MED ORDER — FERROUS SULFATE 325 (65 FE) MG PO TABS
325.0000 mg | ORAL_TABLET | Freq: Two times a day (BID) | ORAL | Status: DC
  Administered 2017-01-11 – 2017-01-12 (×3): 325 mg via ORAL
  Filled 2017-01-10 (×3): qty 1

## 2017-01-10 MED ORDER — INSULIN ASPART 100 UNIT/ML ~~LOC~~ SOLN
0.0000 [IU] | Freq: Three times a day (TID) | SUBCUTANEOUS | Status: DC
Start: 2017-01-11 — End: 2017-01-11

## 2017-01-10 MED ORDER — HEPARIN SODIUM (PORCINE) 5000 UNIT/ML IJ SOLN
5000.0000 [IU] | Freq: Three times a day (TID) | INTRAMUSCULAR | Status: DC
  Administered 2017-01-10 – 2017-01-13 (×8): 5000 [IU] via SUBCUTANEOUS
  Filled 2017-01-10 (×9): qty 1

## 2017-01-10 MED ORDER — ONDANSETRON HCL 4 MG/2ML IJ SOLN: 4.0000 mg | Freq: Four times a day (QID) | INTRAMUSCULAR | Status: DC | PRN

## 2017-01-10 MED ORDER — SACCHAROMYCES BOULARDII 250 MG PO CAPS
250.0000 mg | ORAL_CAPSULE | Freq: Two times a day (BID) | ORAL | Status: DC
  Administered 2017-01-10 – 2017-01-17 (×14): 250 mg via ORAL
  Filled 2017-01-10 (×14): qty 1

## 2017-01-10 MED ORDER — ALBUTEROL SULFATE (2.5 MG/3ML) 0.083% IN NEBU: 2.5000 mg | INHALATION_SOLUTION | Freq: Four times a day (QID) | RESPIRATORY_TRACT | Status: DC | PRN

## 2017-01-10 MED ORDER — OMEGA-3-ACID ETHYL ESTERS 1 G PO CAPS
1.0000 g | ORAL_CAPSULE | Freq: Two times a day (BID) | ORAL | Status: DC
  Administered 2017-01-10 – 2017-01-17 (×14): 1 g via ORAL
  Filled 2017-01-10 (×14): qty 1

## 2017-01-10 MED ORDER — STERILE WATER FOR INJECTION IV SOLN
INTRAVENOUS | Status: DC
  Administered 2017-01-11 – 2017-01-12 (×4): via INTRAVENOUS
  Filled 2017-01-10 (×7): qty 850

## 2017-01-10 MED ORDER — ACETAMINOPHEN 325 MG PO TABS: 650.0000 mg | ORAL_TABLET | Freq: Four times a day (QID) | ORAL | Status: DC | PRN

## 2017-01-10 NOTE — ED Notes (Signed)
Pt placed on bedpan to provide sample.  Pt able to urinate but not enough for sample.  RN notified.  Pt will alert staff next time she has to urinate, in attempts to obtain urine sample.

## 2017-01-10 NOTE — ED Triage Notes (Signed)
Patient coming from [facility] with c/o abdominal pain and ab labs. Abdominal pain been going on for over a months the patient is alert and oriented x4.

## 2017-01-10 NOTE — ED Notes (Signed)
Bed: WA08 Expected date:  Expected time:  Means of arrival:  Comments: Hold for [facility] A

## 2017-01-10 NOTE — ED Provider Notes (Signed)
[facility] DEPT Provider Note   CSN: [id number] Arrival date & time: 01/10/17  1252     History   Chief Complaint No chief complaint on file.   HPI [name] is a 66 y.o. female w PMHx CKD, CHF, lewy body dementia, CAD, HTN, presenting from [facility] rehab facility for worsening kidney function, increasing white blood cell count, and epigastric abdominal pain.  Patient was admitted on 12/27/2016 for chronic renal failure, and multiple abscesses in the groin.  She was discharged on 12/29/2016 [facility] on PO clindamycin.  Per chart review, the facility monitored her labs with documented downtrending creatinine and white blood cell count, however patient worsening decreased appetite and complaining of epigastric abdominal pain.  Labs taken on 01/09/2017 show increasing white count to 16.9 and worsening creatinine from 2.61 to 3.70. BUN 60.7. Chest x-ray was also done yesterday and was negative. Also noted that she had some episodes of emesis.  She reports intermittent epigastric abdominal pain, and nonbloody diarrhea.  Currently she denies nausea or vomiting.  Status post cholecystectomy.  She denies cough, congestion, chest pain, shortness of breath, urinary symptoms, worsening skin infection or new abscesses.  The history is provided by the patient, the spouse and medical records.    Past Medical History:  Diagnosis Date  . Abscess, groin 12/2016  . Anemia, chronic disease 05/16/2008   Qualifier: Diagnosis of  By: Percival Spanish, MD, Farrel Gordon    . Bradycardia    a. Limiting BB titration.  [name] CAD (coronary artery disease)    a. NSTEMI 2007 in setting of anemia; LHC 50% LM, 60% LAD, 30% RI, occluded RCA with L-R collaterals, normal EF, treated medically; B. LHC 01/2015 prox-mid RCA 100%, ostial to prox LAD 70%, LM 50%, mid-distal circ 90%-not surgical candidate due to renal and pulmonary issues).  . Carotid artery disease ([place])    a. Duplex  12/2014: left CEA patent with elevated velocities, 60-79% RICA (stable over serial exams), 73-53% LICA stenosis (stable over serial exams), 50% LECA, elevated velocities in bilateral subclavian arteries.  . Chronic combined systolic (congestive) and diastolic (congestive) heart failure ([place])    a. 06/2016 Echo: EF 40-45%, mod MR, mildly dil LA/RA, mod TR, PASP 47mmHg.  . CKD (chronic kidney disease), stage IV ([place])   . COPD (chronic obstructive pulmonary disease) (HCC)    home oxygen  . Dementia   . Depression   . Diabetes mellitus   . Diabetic retinopathy ([place])   . Essential hypertension   . GI AVM (gastrointestinal arteriovenous vascular malformation)    small bowel  . Hyperlipidemia   . Hypertensive heart disease with congestive heart failure ([place])   . Iron deficiency anemia    a. ? chronic GI blood loss-->plavix d/c'd 06/2016.  [name] Peripheral vascular disease ([place])    a.s /p left common iliac and external iliac artery stents. b. H/o subclavian, mesenteric, and celiac artery stenosis (see below).  . Protein-calorie malnutrition ([place]) 01/24/2015  . Pulmonary hypertension ([place])    a. 06/2016 Echo: PASP 81mmHg.  [name] Renal artery stenosis ([place])    a. Duplex 2014: no obvious evidence of hemodynamically significant stenosis >60%, bilateral intrarenal arteries exhibit absent diastolic flow. There is evidence of elevated velocities of the mid aorta. 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  . Tobacco abuse   . 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 Dieaulafoy's lesion 01/25/2012    Patient Active Problem List   Diagnosis Date  Noted  . Right renal mass 12/30/2016  . Caliectasis 12/27/2016  . Anemia 12/27/2016  . Skin breakdown 12/27/2016  . Boil 12/27/2016  . Hematuria 12/27/2016  . Abscess of groin   . Bilateral carotid artery stenosis 08/22/2016  . Lung nodule 06/29/2016  . Acute kidney injury superimposed on chronic kidney disease ([place]) 06/23/2016  . Acute on  chronic combined systolic and diastolic CHF (congestive heart failure) ([place]) 06/23/2016  . Hypokalemia 06/23/2016  . Acute esophagitis   . Angiodysplasia of duodenum   . Symptomatic anemia 06/21/2016  . Cardiac enzymes elevated   . Elevated liver enzymes   . Transaminitis   . Demand ischemia ([place])   . Coffee ground emesis   . Heme positive stool   . Dementia 03/04/2016  . CAP (community acquired pneumonia) 05/13/2015  . Indwelling catheter present on admission 04/21/2015  . Difficulty breathing   . Pulmonary infiltrates   . Staphylococcus aureus bacteremia   . Palliative care encounter   . Malnutrition of moderate degree 03/30/2015  . HCAP (healthcare-associated pneumonia) 03/29/2015  . Acute respiratory failure ([place]) 03/29/2015  . Hypomagnesemia 03/29/2015  . Low serum vitamin D 03/29/2015  . Metabolic acidosis 00/93/8182  . Coronary artery disease involving native coronary artery of native heart with angina pectoris ([place])   . Sacral ulcer ([place])   . Urinary retention   . Hypocalcemia 03/20/2015  . Lewy body dementia without behavioral disturbance   . AKI (acute kidney injury) ([place])   . Protein-calorie malnutrition ([place]) 01/24/2015  . Abnormal nuclear stress test   . Stage 4 chronic kidney disease ([place])   . Carotid artery disease ([place])   . CAD (coronary artery disease)   . Chronic diastolic CHF (congestive heart failure) ([place])   . Essential hypertension   . Former tobacco use   . Bradycardia   . UTI (urinary tract infection) 01/27/2012  . Nausea & vomiting 01/26/2012  . 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 Diet-controlled diabetes mellitus ([place]) 01/25/2012  . Iron deficiency anemia 01/25/2012  . Renal artery stenosis ([place]) 11/19/2010  . Peripheral vascular disease ([place]) 11/19/2010  . Diabetes mellitus with complication ([place]) 99/37/1696  . Hyperlipidemia 05/16/2008  . OBESITY 05/16/2008  . Anemia, chronic disease 05/16/2008  . ANXIETY  DEPRESSION 05/16/2008  . History of CVA (cerebrovascular accident) 05/16/2008  . Chronic obstructive pulmonary disease ([place]) 05/16/2008  . DIVERTICULITIS, HX OF 05/16/2008    Past Surgical History:  Procedure Laterality Date  . APPENDECTOMY    . CARDIAC CATHETERIZATION N/A 01/23/2015   Procedure: Left Heart Cath and Coronary Angiography;  Surgeon: [name], MD;  Location: [facility];  Service: Cardiovascular;  Laterality: N/A;  . CAROTID ENDARTERECTOMY    . CESAREAN SECTION    . CHOLECYSTECTOMY    . COLONOSCOPY WITH ESOPHAGOGASTRODUODENOSCOPY (EGD)  06/22/2016  . ENTEROSCOPY  01/26/2012   Procedure: ENTEROSCOPY;  Surgeon: Gatha Mayer, MD;  Location: [facility] ENDOSCOPY;  Service: Endoscopy;  Laterality: N/A;  . ENTEROSCOPY N/A 03/25/2015   Procedure: ENTEROSCOPY;  Surgeon: Doran Stabler, MD;  Location: [facility] ENDOSCOPY;  Service: Endoscopy;  Laterality: N/A;  . ENTEROSCOPY N/A 06/22/2016   Procedure: ENTEROSCOPY;  Surgeon: Jerene Bears, MD;  Location: [facility] ENDOSCOPY;  Service: Endoscopy;  Laterality: N/A;  . ESOPHAGOGASTRODUODENOSCOPY (EGD) WITH PROPOFOL N/A 06/22/2016   Procedure: ESOPHAGOGASTRODUODENOSCOPY (EGD) WITH PROPOFOL;  Surgeon: Jerene Bears, MD;  Location: [facility] ENDOSCOPY;  Service: Endoscopy;  Laterality: N/A;  . ILIAC  ARTERY STENT     left  . SHOULDER ARTHROSCOPY    . TONSILLECTOMY      OB History    No data available       Home Medications    Prior to Admission medications   Medication Sig Start Date End Date Taking? Authorizing Provider  acetaminophen (TYLENOL) 325 MG tablet Take 325 mg by mouth every 6 (six) hours as needed.    [provider]  albuterol (PROVENTIL HFA;VENTOLIN HFA) 108 (90 Base) MCG/ACT inhaler Inhale 2 puffs into the lungs every 6 (six) hours as needed for wheezing or shortness of breath. 06/15/16   Arnoldo Morale, MD  aspirin EC 81 MG tablet Take 81 mg by mouth daily.    [provider]  atorvastatin (LIPITOR) 80 MG  tablet TAKE 1 TABLET BY MOUTH  DAILY AT 6 PM. 08/12/16   Arnoldo Morale, MD  carvedilol (COREG) 6.25 MG tablet TAKE 1 TABLET BY MOUTH TWO  TIMES DAILY WITH A MEAL 09/13/16   Arnoldo Morale, MD  donepezil (ARICEPT) 10 MG tablet Take 1 tablet (10 mg total) by mouth at bedtime. 06/15/16   Arnoldo Morale, MD  ferrous sulfate 325 (65 FE) MG tablet Take 1 tablet (325 mg total) by mouth 2 (two) times daily with a meal. 06/15/16   Arnoldo Morale, MD  fish oil-omega-3 fatty acids 1000 MG capsule Take 1 g by mouth 3 (three) times daily. Reported on 04/09/2015    [provider]  furosemide (LASIX) 40 MG tablet Take 40 mg by mouth 2 (two) times daily.    [provider]  hydrALAZINE (APRESOLINE) 50 MG tablet TAKE 1 TABLET BY MOUTH 3  TIMES DAILY 12/20/16   Tresa Garter, MD  nitroGLYCERIN (NITROSTAT) 0.4 MG SL tablet Place 0.4 mg under the tongue every 5 (five) minutes as needed for chest pain. X 3 doses    [provider]  NUTRITIONAL SUPPLEMENT LIQD Take 120 mLs 2 (two) times daily by mouth. [employer]    [provider]  pantoprazole (PROTONIX) 40 MG tablet TAKE 1 TABLET BY MOUTH  DAILY 11/22/16   Arnoldo Morale, MD  potassium chloride (K-DUR) 10 MEQ tablet Take 10 mEq daily by mouth.    [provider]  promethazine (PHENERGAN) 25 MG tablet Take 25 mg by mouth every 6 (six) hours as needed for nausea or vomiting. x3 doses    [provider]  saccharomyces boulardii (FLORASTOR) 250 MG capsule Take 250 mg 2 (two) times daily by mouth.    [provider]  sertraline (ZOLOFT) 50 MG tablet Take 1 tablet (50 mg total) by mouth daily. 08/12/16   Arnoldo Morale, MD  traMADol (ULTRAM) 50 MG tablet Take 50 mg by mouth 2 (two) times daily.    [provider]    Family History Family History  Problem Relation Age of Onset  . Hypertension Mother   . Heart attack Mother   . Hypertension Father   . Angina Father   . Cancer Sister   . Cancer Brother     . Stroke Neg Hx     Social History Social History   Tobacco Use  . Smoking status: Current Every Day Smoker    Packs/day: 1.00    Years: 36.00    Pack years: 36.00    Types: Cigarettes  . Smokeless tobacco: Never Used  Substance Use Topics  . Alcohol use: No    Alcohol/week: 0.0 oz    Frequency: Never  . Drug  use: No     Allergies   Patient has no known allergies.   Review of Systems Review of Systems  Constitutional: Positive for appetite change. Negative for chills and fever.  HENT: Negative for congestion and sore throat.   Respiratory: Negative for cough and shortness of breath.   Cardiovascular: Negative for chest pain.  Gastrointestinal: Positive for abdominal pain, diarrhea, nausea and vomiting. Negative for blood in stool and constipation.  Genitourinary: Negative for dyspareunia and frequency.  All other systems reviewed and are negative.    Physical Exam Updated Vital Signs BP (!) 122/44   Pulse (!) 58   Temp 97.8 F (36.6 C)   Resp 13   SpO2 100%   Physical Exam  Constitutional: She appears well-developed and well-nourished. No distress.  Chronically ill-appearing  HENT:  Head: Normocephalic and atraumatic.  Mouth/Throat: Oropharynx is clear and moist.  Eyes: Conjunctivae are normal.  Neck: Normal range of motion. Neck supple.  Cardiovascular: Regular rhythm, normal heart sounds and intact distal pulses.  Slightly bradycardic  Pulmonary/Chest: Effort normal and breath sounds normal. No respiratory distress.  Abdominal: Soft. Bowel sounds are normal. She exhibits no distension. There is no tenderness. There is no rebound and no guarding.  Neurological: She is alert.  Skin: Skin is warm.  Wounds in groin appear to be healing well, with no evidence of new/worsening infection.  Psychiatric: She has a normal mood and affect. Her behavior is normal.  Nursing note and vitals reviewed.    ED Treatments / Results  Labs (all labs ordered are  listed, but only abnormal results are displayed) Labs Reviewed  COMPREHENSIVE METABOLIC PANEL - Abnormal; Notable for the following components:      Result Value   Chloride 115 (*)    CO2 10 (*)    Glucose, Bld 125 (*)    BUN 70 (*)    Creatinine, Ser 3.71 (*)    Calcium 8.1 (*)    Total Protein 5.3 (*)    Albumin 2.1 (*)    GFR calc non Af Amer 12 (*)    GFR calc Af Amer 14 (*)    All other components within normal limits  CBC WITH DIFFERENTIAL/PLATELET - Abnormal; Notable for the following components:   WBC 17.8 (*)    RBC 3.33 (*)    Hemoglobin 9.7 (*)    HCT 29.2 (*)    RDW 15.8 (*)    Neutro Abs 16.5 (*)    Lymphs Abs 0.4 (*)    All other components within normal limits  LIPASE, BLOOD - Abnormal; Notable for the following components:   Lipase 70 (*)    All other components within normal limits  URINALYSIS, ROUTINE W REFLEX MICROSCOPIC    EKG  EKG Interpretation None       Radiology No results found.  Procedures Procedures (including critical care time)  Medications Ordered in ED Medications  sodium chloride 0.9 % bolus 1,000 mL (1,000 mLs Intravenous New Bag/Given 01/10/17 1523)     Initial Impression / Assessment and Plan / ED Course  I have reviewed the triage vital signs and the nursing notes.  Pertinent labs & imaging results that were available during my care of the patient were reviewed by me and considered in my medical decision making (see chart for details).     Pt presenting from [facility] facility for worsening kidney function, increasing WBC, anorexia and epigastric abdominal pain. Pt recently discharged from admission for acute on chronic renal failure and  multiple abscesses requiring IV abx. Per chart review, pt was improving after finishing PO abx however developed decreased appetite and began having GI complaints including diarrhea. Suspect possible c.diff vs viral gastroenteritis? On exam, abdomen is nontender without peritoneal signs. Do  not feel imaging is needed at this time. Pt is chronically ill-appearing, VSS. IVF given. Repeat labs today showing WBC 17.8, Cr 3.71, BUN 70. Bicarb 10, suspect 2/t uremia. [facility] consulted for admission for acutely worsening renal failure, likely 2/t dehydration, and worsening leukocytosis.    4:36 PM Dr. Sheran Fava accepting admission.   Patient discussed with and seen by Dr. Venora Maples.  The patient appears reasonably stabilized for admission considering the current resources, flow, and capabilities available in the ED at this time, and I doubt any other [facility] requiring further screening and/or treatment in the ED prior to admission.   Final Clinical Impressions(s) / ED Diagnoses   Final diagnoses:  Acute kidney injury superimposed on chronic kidney disease [facility])    ED Discharge Orders    None       Kimberlin Scheel, [place] N, PA-C 01/10/17 1638    Jola Schmidt, MD 01/11/17 [phone]

## 2017-01-10 NOTE — H&P (Addendum)
History and Physical    [name] WGN:[id number] DOB: May 07, 1950 DOA: 01/10/2017  Referring MD/NP/PA: Martinique Robinson PCP: Arnoldo Morale, MD   Patient coming from: [facility]  Chief Complaint: abdominal pain, nausea, acute kidney injury, leukocytosis  HPI: [name] is a 66 y.o. female with history of diabetes, hypertension, hyperlipidemia, CVA, peripheral vascular disease, multivessel CAD, chronic systolic heart failure with EF 25%-30%, dementia, iron deficiency anemia, possible right kidney mass and right renal pelvis nephrolith who was admitted from 11/6 to 11/8 due to a left groin abscess and intertrigo.  Her abscess was drained and she was discharged to SNF on clindamycin.  Her abscess has been healing well, however, she has not been thriving.  According to the patient and her husband, even prior to her last admission, she had become very weak and would lie on the couch without getting up to void or have BMs.  She reports she has had watery and sometimes blood-tinged diarrhea for the last two months, several times per day.  Not eating slows down her diarrhea.  She also has had vomiting with exertion and immediately after eating with streaks of blood and some grass green emesis.  As a result, she does not eat or drink much.  She has intermittent periumbical abdominal pain worse with eating.  During her last admission, she had some dehydration-related AKI which improved with IVF.  She had labs done at [facility] today which demonstrated recurrent AKI and leukocytosis and she was sent to the ER for evaluation.     ED Course: She was afebrile with mild hypotension 91/40.  Labs:  WBC 17.8, hemoglobin 9.7g/dl.  Baseline creatinine is probably around 2.6g/dl and today 3.71 g/dl, but for the last two years, her creatinine has fluctuated up to above four and hovered mostly in the 3-range.  UA is pending.  I have also ordered a repeat CT ab/p to evaluate her right kidney stone.  Due to her AoCKD,  unable to order contrast which would have helped with evaluating for mesenteric ischemia.    Review of Systems:  General:  Denies fevers, chills, gradual weight loss over last year HEENT:  Denies changes to hearing and vision, sinus congestion, sore throat CV:  Denies chest pain, palpitations, lower extremity edema.  PULM:  Denies SOB, cough.   GI:  Per HPI GU:  Denies dysuria, frequency, urgency ENDO:  Denies polyuria, polydipsia.   HEME:  Denies blood in stools, abnormal bruising or bleeding.  LYMPH:  Denies lymphadenopathy.   MSK:  Denies arthralgias, myalgias.   DERM:  Denies skin rash or ulcer.   NEURO:  Denies new focal numbness or weakness, slurred speech, confusion PSYCH:  Denies anxiety and depression.    Past Medical History:  Diagnosis Date  . Abscess, groin 12/2016  . Anemia, chronic disease 05/16/2008   Qualifier: Diagnosis of  By: Percival Spanish, MD, Farrel Gordon    . Bradycardia    a. Limiting BB titration.  [name] CAD (coronary artery disease)    a. NSTEMI 2007 in setting of anemia; LHC 50% LM, 60% LAD, 30% RI, occluded RCA with L-R collaterals, normal EF, treated medically; B. LHC 01/2015 prox-mid RCA 100%, ostial to prox LAD 70%, LM 50%, mid-distal circ 90%-not surgical candidate due to renal and pulmonary issues).  . Carotid artery disease ([place])    a. Duplex 12/2014: left CEA patent with elevated velocities, 60-79% RICA (stable over serial exams), 78-46% LICA stenosis (stable over serial exams), 50% LECA, elevated velocities in  bilateral subclavian arteries.  . Chronic combined systolic (congestive) and diastolic (congestive) heart failure ([place])    a. 06/2016 Echo: EF 40-45%, mod MR, mildly dil LA/RA, mod TR, PASP 34mmHg.  . CKD (chronic kidney disease), stage IV ([place])   . COPD (chronic obstructive pulmonary disease) (HCC)    home oxygen  . Dementia   . Depression   . Diabetes mellitus   . Diabetic retinopathy ([place])   . Essential hypertension   . GI AVM (gastrointestinal  arteriovenous vascular malformation)    small bowel  . Hyperlipidemia   . Hypertensive heart disease with congestive heart failure ([place])   . Iron deficiency anemia    a. ? chronic GI blood loss-->plavix d/c'd 06/2016.  [name] Peripheral vascular disease ([place])    a.s /p left common iliac and external iliac artery stents. b. H/o subclavian, mesenteric, and celiac artery stenosis (see below).  . Protein-calorie malnutrition ([place]) 01/24/2015  . Pulmonary hypertension ([place])    a. 06/2016 Echo: PASP 40mmHg.  [name] Renal artery stenosis ([place])    a. Duplex 2014: no obvious evidence of hemodynamically significant stenosis >60%, bilateral intrarenal arteries exhibit absent diastolic flow. There is evidence of elevated velocities of the mid aorta. 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  . Tobacco abuse   . 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 APPENDECTOMY    . CARDIAC CATHETERIZATION N/A 01/23/2015   Procedure: Left Heart Cath and Coronary Angiography;  Surgeon: [name], MD;  Location: [place] [facility];  Service: Cardiovascular;  Laterality: N/A;  . CAROTID ENDARTERECTOMY    . CESAREAN SECTION    . CHOLECYSTECTOMY    . COLONOSCOPY WITH ESOPHAGOGASTRODUODENOSCOPY (EGD)  06/22/2016  . ENTEROSCOPY  01/26/2012   Procedure: ENTEROSCOPY;  Surgeon: Gatha Mayer, MD;  Location: [facility] ENDOSCOPY;  Service: Endoscopy;  Laterality: N/A;  . ENTEROSCOPY N/A 03/25/2015   Procedure: ENTEROSCOPY;  Surgeon: Doran Stabler, MD;  Location: [facility] ENDOSCOPY;  Service: Endoscopy;  Laterality: N/A;  . ENTEROSCOPY N/A 06/22/2016   Procedure: ENTEROSCOPY;  Surgeon: Jerene Bears, MD;  Location: [facility] ENDOSCOPY;  Service: Endoscopy;  Laterality: N/A;  . ESOPHAGOGASTRODUODENOSCOPY (EGD) WITH PROPOFOL N/A 06/22/2016   Procedure: ESOPHAGOGASTRODUODENOSCOPY (EGD) WITH PROPOFOL;  Surgeon: Jerene Bears, MD;  Location: [facility]  ENDOSCOPY;  Service: Endoscopy;  Laterality: N/A;  . ILIAC ARTERY STENT     left  . SHOULDER ARTHROSCOPY    . TONSILLECTOMY       reports that she has been smoking cigarettes.  She has a 36.00 pack-year smoking history. she has never used smokeless tobacco. She reports that she does not drink alcohol or use drugs.  No Known Allergies  Family History  Problem Relation Age of Onset  . Hypertension Mother   . Heart attack Mother   . Hypertension Father   . Angina Father   . Cancer Sister   . Cancer Brother   . Stroke Neg Hx     Prior to Admission medications   Medication Sig Start Date End Date Taking? Authorizing Provider  albuterol (PROVENTIL HFA;VENTOLIN HFA) 108 (90 Base) MCG/ACT inhaler Inhale 2 puffs into the lungs every 6 (six) hours as needed for wheezing or shortness of breath. 06/15/16  Yes Amao, Enobong, MD  atorvastatin (LIPITOR) 80 MG tablet TAKE 1 TABLET BY MOUTH  DAILY AT 6 PM. 08/12/16  Yes Arnoldo Morale, MD  acetaminophen (  TYLENOL) 325 MG tablet Take 325 mg by mouth every 6 (six) hours as needed.    [provider]  aspirin EC 81 MG tablet Take 81 mg by mouth daily.    [provider]  carvedilol (COREG) 6.25 MG tablet TAKE 1 TABLET BY MOUTH TWO  TIMES DAILY WITH A MEAL 09/13/16   Arnoldo Morale, MD  donepezil (ARICEPT) 10 MG tablet Take 1 tablet (10 mg total) by mouth at bedtime. 06/15/16   Arnoldo Morale, MD  ferrous sulfate 325 (65 FE) MG tablet Take 1 tablet (325 mg total) by mouth 2 (two) times daily with a meal. 06/15/16   Arnoldo Morale, MD  fish oil-omega-3 fatty acids 1000 MG capsule Take 1 g by mouth 3 (three) times daily. Reported on 04/09/2015    [provider]  furosemide (LASIX) 40 MG tablet Take 40 mg by mouth 2 (two) times daily.    [provider]  hydrALAZINE (APRESOLINE) 50 MG tablet TAKE 1 TABLET BY MOUTH 3  TIMES DAILY 12/20/16   Tresa Garter, MD  nitroGLYCERIN (NITROSTAT) 0.4 MG SL tablet Place 0.4 mg under the  tongue every 5 (five) minutes as needed for chest pain. X 3 doses    [provider]  NUTRITIONAL SUPPLEMENT LIQD Take 120 mLs 2 (two) times daily by mouth. [employer]    [provider]  pantoprazole (PROTONIX) 40 MG tablet TAKE 1 TABLET BY MOUTH  DAILY 11/22/16   Arnoldo Morale, MD  potassium chloride (K-DUR) 10 MEQ tablet Take 10 mEq daily by mouth.    [provider]  promethazine (PHENERGAN) 25 MG tablet Take 25 mg by mouth every 6 (six) hours as needed for nausea or vomiting. x3 doses    [provider]  saccharomyces boulardii (FLORASTOR) 250 MG capsule Take 250 mg 2 (two) times daily by mouth.    [provider]  sertraline (ZOLOFT) 50 MG tablet Take 1 tablet (50 mg total) by mouth daily. 08/12/16   Arnoldo Morale, MD  traMADol (ULTRAM) 50 MG tablet Take 50 mg by mouth 2 (two) times daily.    [provider]    Physical Exam: Vitals:   01/10/17 1400 01/10/17 1500 01/10/17 1600 01/10/17 1700  BP: (!) 108/43 (!) 93/44 (!) 102/50 (!) 91/40  Pulse: (!) 59 (!) 59 62 (!) 58  Resp: 12 18 11 14   Temp:      SpO2: 100% 100% 100% 100%   Gen:  Thin adult female, sunken eyes, dry skin, lying on left side Constitutional:  NAD, calm, comfortable Eyes: PERRL, lids and conjunctivae normal ENMT:  Moist mucous membranes.  Oropharynx nonerythematous, no exudates.  Poor dentition Neck:  No nuchal rigidity, no masses Respiratory:  No wheezes, rales, or rhonchi Cardiovascular: Regular rate and rhythm, 2/6 systolic murmur.  2+ radial pulses. Abdomen:  Normal active bowel sounds, soft, nondistended, TTP over umbilical area without rebound or guarding.   Musculoskeletal: Normal muscle tone and bulk.  No contractures.  Skin:  no rashes, abrasions, or ulcers.  Has a healing left groin abscess without surroudning erythema or induration, remaining hole is about 63mm in diameter.   Neurologic:  CN 2-12 grossly intact. Sensation intact to light touch, strength 5/5  throughout Psychiatric:  Alert and oriented x 3. Flat affect.   Labs on Admission: I have personally reviewed following labs and imaging studies  CBC: Recent Labs  Lab 01/10/17 1430  WBC 17.8*  NEUTROABS 16.5*  HGB 9.7*  HCT 29.2*  MCV  87.7  PLT 884   Basic Metabolic Panel: Recent Labs  Lab 01/10/17 1430  NA 135  K 4.0  CL 115*  CO2 10*  GLUCOSE 125*  BUN 70*  CREATININE 3.71*  CALCIUM 8.1*   GFR: Estimated Creatinine Clearance: 10.2 mL/min (A) (by C-G formula based on SCr of 3.71 mg/dL (H)). Liver Function Tests: Recent Labs  Lab 01/10/17 1430  AST 20  ALT 16  ALKPHOS 93  BILITOT 0.6  PROT 5.3*  ALBUMIN 2.1*   Recent Labs  Lab 01/10/17 1430  LIPASE 70*   No results for input(s): AMMONIA in the last 168 hours. Coagulation Profile: No results for input(s): INR, PROTIME in the last 168 hours. Cardiac Enzymes: No results for input(s): CKTOTAL, CKMB, CKMBINDEX, TROPONINI in the last 168 hours. BNP (last 3 results) No results for input(s): PROBNP in the last 8760 hours. HbA1C: No results for input(s): HGBA1C in the last 72 hours. CBG: No results for input(s): GLUCAP in the last 168 hours. Lipid Profile: No results for input(s): CHOL, HDL, LDLCALC, TRIG, CHOLHDL, LDLDIRECT in the last 72 hours. Thyroid Function Tests: No results for input(s): TSH, T4TOTAL, FREET4, T3FREE, THYROIDAB in the last 72 hours. Anemia Panel: No results for input(s): VITAMINB12, FOLATE, FERRITIN, TIBC, IRON, RETICCTPCT in the last 72 hours. Urine analysis:    Component Value Date/Time   COLORURINE RED (A) 12/20/2016 1713   APPEARANCEUR TURBID (A) 12/20/2016 1713   LABSPEC  12/20/2016 1713    TEST NOT REPORTED DUE TO COLOR INTERFERENCE OF URINE PIGMENT   PHURINE  12/20/2016 1713    TEST NOT REPORTED DUE TO COLOR INTERFERENCE OF URINE PIGMENT   GLUCOSEU (A) 12/20/2016 1713    TEST NOT REPORTED DUE TO COLOR INTERFERENCE OF URINE PIGMENT   HGBUR (A) 12/20/2016 1713    TEST NOT  REPORTED DUE TO COLOR INTERFERENCE OF URINE PIGMENT   BILIRUBINUR (A) 12/20/2016 1713    TEST NOT REPORTED DUE TO COLOR INTERFERENCE OF URINE PIGMENT   BILIRUBINUR negative 08/11/2015 1513   KETONESUR (A) 12/20/2016 1713    TEST NOT REPORTED DUE TO COLOR INTERFERENCE OF URINE PIGMENT   PROTEINUR (A) 12/20/2016 1713    TEST NOT REPORTED DUE TO COLOR INTERFERENCE OF URINE PIGMENT   UROBILINOGEN 0.2 08/11/2015 1513   UROBILINOGEN 0.2 01/08/2015 1215   NITRITE (A) 12/20/2016 1713    TEST NOT REPORTED DUE TO COLOR INTERFERENCE OF URINE PIGMENT   LEUKOCYTESUR (A) 12/20/2016 1713    TEST NOT REPORTED DUE TO COLOR INTERFERENCE OF URINE PIGMENT   Sepsis Labs: @LABRCNTIP (procalcitonin:4,lacticidven:4) )No results found for this or any previous visit (from the past 240 hour(s)).   Radiological Exams on Admission: No results found.  EKG: Independently reviewed. Not performed  Assessment/Plan Principal Problem:   Acute kidney injury superimposed on chronic kidney disease (HCC) Active Problems:   Chronic obstructive pulmonary disease (HCC)   Renal artery stenosis (HCC)   Diet-controlled diabetes mellitus (HCC)   Iron deficiency anemia   Stage 4 chronic kidney disease (HCC)   CAD (coronary artery disease)   Chronic diastolic CHF (congestive heart failure) (HCC)   Protein-calorie malnutrition (HCC)   Dementia   Anemia   Abscess of groin  Acute on chronic kidney disease, stage 4, with nongap metabolic acidosis, on baseline creatinine is probably around 2.6g/dl and today 3.71 g/dl, but for the last two years, her creatinine has fluctuated up to above four and hovered mostly in the 3-range.  Likely has some ATN from dehydration and  some CKD related to vascular disease (including renovascular disease), hypertension, and diabetes.   -  NS bolus -  Start sodium bicarbonate fluids -  Minimize nephrotoxins -  Renally dose medications -  Hold lasix -  Imaging with CT instead of RUS due to  complaints below  Nausea, vomiting, abdominal pain and diarrhea.  I am concerned she may have some chronic mesenteric ischemia, however, her kidney function prevents contrast administration to visualize those arteries carefully.  LFTs stable.  Lipase only minimally elevated.   -  F/u urinalysis which is pending -  Zofran prn with phenergan for breakthrough -  C. Diff and GI pathogen PCR -  Occult stool -  CT ab/pelvis with oral but not IV contrast  Previous multiple GI bleeds with erosive esophagitis, now with streaks of blood in emesis and stools -  More anemic today -  Occult stool -  Continue PPI  Hematuria due to right renal mass and 44mm stone -  F/u with Urology. Dr. Janina Mayo at already scheduled appointment in Dec -  CT scan without contrast  Diabetes mellitus type 2, A1c 5.6 in July -  Start low dose SSI and discontinue if blood sugars remain well controlled  Dementia, mild to moderate, needs reorientation, husband gives meds to her, daughter organizes -  Continue aricept  Chronic diastolic and systolic heart failure, EF 40-45% most recently -  Hold blood pressure medications due to hypotension -  Hold lasix due to dehydration  CAD, not candidate for PCI multivessel disease - Hold Coreg and hydralazine due to hypotension -  Continue atorvastatin - Continue aspirin but hold if signs of bleeding (see below)  Peripheral vascular disease status post left common iliac and external iliac artery stents as well as prior CVA on Plavix - continue aspirin  COPD, stable, continue albuterol  Left groin abscess.  Continue once daily silver dressing with dry dressing changes  Generalized weakness -  PT/OT consultations  Iron deficiency anemia, on supplementation but worsening anemia -  Iron studies, B12, folate -  TSH -  Occult stool -  Repeat hgb in AM -  Continue iron supplementation  DVT prophylaxis: heparin   Code Status: DNR Family Communication:  Patient and her  husband Disposition Plan: likely to SNF in a few days  Consults called: none  Admission status: observation.    Janece Canterbury MD [facility] Pager [phone]  If 7PM-7AM, please contact night-coverage [email or web address] Password [facility]  01/10/2017, 6:15 PM

## 2017-01-10 NOTE — Progress Notes (Signed)
DATE:  01/10/2017   MRN:  [id number]  BIRTHDAY: [birth year]  Facility:  Nursing Home Location:  [facility] Room Number: 111-A  LEVEL OF CARE:  SNF (31)  Contact Information    Name Relation Home Work La Russell [phone]     [name] Daughter [phone]  [phone]       Code Status History    Date Active Date Inactive Code Status Order ID Comments User Context   12/27/2016 16:38 12/30/2016 01:02 Full Code [id number]  [name], NP ED   06/21/2016 15:24 06/24/2016 18:23 DNR [id number]  Sela Hilding, MD Inpatient   04/05/2015 15:18 04/08/2015 00:21 DNR [id number]  Pershing Proud, NP Inpatient   03/29/2015 15:28 04/05/2015 15:18 Full Code [id number]  Samella Parr, NP Inpatient   03/20/2015 00:56 03/26/2015 17:45 Full Code [id number]  Reubin Milan, MD Inpatient   03/04/2015 14:09 03/12/2015 18:10 Full Code [id number]  [name], NP ED   01/23/2015 09:04 01/28/2015 16:51 Full Code [id number]  [name], MD Inpatient   01/21/2015 23:06 01/23/2015 09:04 Full Code [id number]  [name], DO ED   12/24/2014 17:22 12/26/2014 18:14 Full Code [id number]  [name], PA-C Inpatient   11/07/2014 09:51 11/09/2014 21:17 Full Code [id number]  [name], MD ED   09/08/2012 03:54 09/10/2012 16:14 Full Code [id number]  [name], MD Inpatient   01/25/2012 16:46 01/28/2012 16:06 Full Code [id number]  Ludwig Lean, RN Inpatient   11/03/2011 20:16 11/05/2011 15:03 Full Code [id number]  [name], RN ED       Chief Complaint  Patient presents with  . Acute Visit    Acute on chronic renal failure, leukocytosis    HISTORY OF PRESENT ILLNESS:  This is a 66-YO female seen for an acute visit secondary to acute on chronic renal failure and leukocytosis.  She is a short-term rehabilitation resident at [facility].  She has a PMH of AKI superimposed on CKD, DM with  complication, anemia, anxiety/depression, history of CVA, COPD, renal artery stenosis, chronic diastolic CHF, HTN, Lewy body dementia, lung nodule, and hypokalemia. She was seen in the room today. She reported eating breakfast but staff reported that she did not eat at all. She is complaining of abdominal pain and is nauseated. Latest creatinine noted to be up to 3.70, CO2 10, Na 138, K 4.0, eGFR <15, wbc 16, hgb 10.6 hct 31.7, platelet 250. She has completed her Clindamycin therapy. It has been reported that she has poor appetite. Yesterday, she drank a little bit of fluids but has vomited. Chest x-ray done yesterday showed no acute abnormality. Urinalysis was also done and awaiting result  She has been admitted to [facility] on 12/29/16 from the hospital with acute kidney injury superimposed on chronic kidney disease. She had an abscess on her left groin, S/P I/D on 11/6. She, also, had rashes on her left groin which is thought to be consistent with fungal intertrigo. She was then started on Clindamycin. CT scan showed that she has a renal mass with 92m stone and is scheduled to follow-up with urologist, Dr. EJunious Silk on 02/03/17.     PAST MEDICAL HISTORY:  Past Medical History:  Diagnosis Date  . Abscess, groin 12/2016  . Anemia, chronic disease 05/16/2008   Qualifier: Diagnosis of  By: HPercival Spanish MD, FFarrel Gordon   . Bradycardia    a. Limiting  BB titration.  [name] CAD (coronary artery disease)    a. NSTEMI 2007 in setting of anemia; LHC 50% LM, 60% LAD, 30% RI, occluded RCA with L-R collaterals, normal EF, treated medically; B. LHC 01/2015 prox-mid RCA 100%, ostial to prox LAD 70%, LM 50%, mid-distal circ 90%-not surgical candidate due to renal and pulmonary issues).  . Carotid artery disease ([place])    a. Duplex 12/2014: left CEA patent with elevated velocities, 60-79% RICA (stable over serial exams), 57-01% LICA stenosis (stable over serial exams), 50% LECA, elevated  velocities in bilateral subclavian arteries.  . Chronic combined systolic (congestive) and diastolic (congestive) heart failure ([place])    a. 06/2016 Echo: EF 40-45%, mod MR, mildly dil LA/RA, mod TR, PASP 85mHg.  . CKD (chronic kidney disease), stage IV ([facility]   . COPD (chronic obstructive pulmonary disease) (HCC)    home oxygen  . Dementia   . Depression   . Diabetes mellitus   . Diabetic retinopathy ([facility] [place]   . Essential hypertension   . GI AVM (gastrointestinal arteriovenous vascular malformation)    small bowel  . Hyperlipidemia   . Hypertensive heart disease with congestive heart failure ([facility]   . Iron deficiency anemia    a. ? chronic GI blood loss-->plavix d/c'd 06/2016.  .Marland KitchenPeripheral vascular disease ([facility] [place]    a.s /p left common iliac and external iliac artery stents. b. H/o subclavian, mesenteric, and celiac artery stenosis (see below).  . Protein-calorie malnutrition ([place] 01/24/2015  . Pulmonary hypertension ([facility]    a. 06/2016 Echo: PASP 868mg.  . Marland Kitchenenal artery stenosis ([facility]   a. Duplex 2014: no obvious evidence of hemodynamically significant stenosis >60%, bilateral intrarenal arteries exhibit absent diastolic flow. There is evidence of elevated velocities of the mid aorta. 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  . Tobacco abuse   . 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 Dieaulafoy's lesion 01/25/2012     CURRENT MEDICATIONS: Reviewed    Medication List        Accurate as of 01/10/17 11:43 AM. Always use your most recent med list.          acetaminophen 325 MG tablet Commonly known as:  TYLENOL   albuterol 108 (90 Base) MCG/ACT inhaler Commonly known as:  PROVENTIL HFA;VENTOLIN HFA Inhale 2 puffs into the lungs every 6 (six) hours as needed for wheezing or shortness of breath.   aspirin EC 81 MG tablet   atorvastatin 80 MG tablet Commonly known as:  LIPITOR TAKE 1 TABLET BY MOUTH  DAILY AT 6 PM.   carvedilol 6.25 MG  tablet Commonly known as:  COREG TAKE 1 TABLET BY MOUTH TWO  TIMES DAILY WITH A MEAL   donepezil 10 MG tablet Commonly known as:  ARICEPT Take 1 tablet (10 mg total) by mouth at bedtime.   ferrous sulfate 325 (65 FE) MG tablet Take 1 tablet (325 mg total) by mouth 2 (two) times daily with a meal.   fish oil-omega-3 fatty acids 1000 MG capsule   furosemide 40 MG tablet Commonly known as:  LASIX   hydrALAZINE 50 MG tablet Commonly known as:  APRESOLINE TAKE 1 TABLET BY MOUTH 3  TIMES DAILY   nitroGLYCERIN 0.4 MG SL tablet Commonly known as:  NITROSTAT   NUTRITIONAL SUPPLEMENT Liqd   pantoprazole 40 MG tablet Commonly known as:  PROTONIX TAKE 1 TABLET BY MOUTH  DAILY   potassium chloride 10 MEQ tablet Commonly known as:  K-DUR   promethazine  25 MG tablet Commonly known as:  PHENERGAN   saccharomyces boulardii 250 MG capsule Commonly known as:  FLORASTOR   sertraline 50 MG tablet Commonly known as:  ZOLOFT Take 1 tablet (50 mg total) by mouth daily.   traMADol 50 MG tablet Commonly known as:  ULTRAM        No Known Allergies   REVIEW OF SYSTEMS:  GENERAL:  no fever nor chills  MOUTH and THROAT: Denies oral discomfort, gingival pain or bleeding RESPIRATORY: no cough, SOB, DOE, wheezing, hemoptysis CARDIAC: no chest pain, edema or palpitations GI: + abdominal pain and nausea and vomiting PSYCHIATRIC: Denies feeling of depression or anxiety. No report of hallucinations, insomnia, paranoia, or agitation    PHYSICAL EXAMINATION  GENERAL APPEARANCE:  In no acute distress. Normal body habitus SKIN:  Skin is warm and dry.  MOUTH and THROAT: Lips are without lesions. Oral mucosa is moist and without lesions.  RESPIRATORY: breathing is even & unlabored, BS CTAB CARDIAC: RRR, no murmur,no extra heart sounds, no edema GI: abdomen soft, normal BS, no masses, no tenderness EXTREMITIES: Able to move X 4 extremities PSYCHIATRIC: Alert to self, disoriented to time  and place. Affect and behavior are appropriate    LABS/RADIOLOGY: Labs reviewed: Basic Metabolic Panel: Recent Labs    06/21/16 1624  06/23/16 0248  12/27/16 1054 12/27/16 1725 12/28/16 0231 12/29/16 0432 01/02/17 01/03/17  NA  --    < > 136   < > 138  --  140 142 143 147  K  --    < > 2.8*   < > 3.3*  --  3.1* 4.4 6.3*  4.4 4.9  CL  --    < > 102   < > 116*  --  120* 124*  --   --   CO2  --    < > 23   < > 10*  --  11* 11*  --   --   GLUCOSE  --    < > 90   < > 93  --  59* 111*  --   --   BUN  --    < > 71*   < > 68*  --  67* 59* 63* 60*  CREATININE  --    < > 3.11*   < > 3.81*  --  3.59* 3.32* 2.7* 2.6*  CALCIUM  --    < > 7.6*   < > 8.6*  --  8.2* 8.6*  --   --   MG 2.1  --  1.7  --   --  1.8  --   --   --   --   PHOS 5.5*  --   --   --   --   --   --   --   --   --    < > = values in this interval not displayed.   Liver Function Tests: Recent Labs    06/24/16 0339 06/29/16 1049 12/20/16 1900  AST 51* 15 19  ALT 116* 34* 10*  ALKPHOS 83 90 80  BILITOT 0.9 0.2 0.6  PROT 4.9* 5.4* 6.0*  ALBUMIN 2.1* 3.0* 2.7*   Recent Labs    06/21/16 1030 06/22/16 0947  LIPASE 52* 33   CBC: Recent Labs    07/14/16 1041  12/27/16 1054 12/28/16 0231 12/29/16 0432  WBC 5.4   < > 12.0* 8.8 8.5  NEUTROABS 3.9  --   --   --  7.3  HGB 11.8   < > 10.5* 9.7* 10.0*  HCT 38.7   < > 32.1* 29.4* 30.1*  MCV 85   < > 87.0 86.0 87.0  PLT 229   < > 279 269 320   < > = values in this interval not displayed.   Lipid Panel: Recent Labs    09/20/16 0859  HDL 46   Cardiac Enzymes: Recent Labs    06/22/16 0545 06/22/16 0947 06/22/16 1444  TROPONINI 0.43* 0.41* 0.34*   CBG: Recent Labs    12/29/16 1157 12/29/16 1649 12/29/16 2010  GLUCAP 224* 214* 164*      Ct Renal Stone Study  Result Date: 12/20/2016 CLINICAL DATA:  66 year old female with hematuria. EXAM: CT ABDOMEN AND PELVIS WITHOUT CONTRAST TECHNIQUE: Multidetector CT imaging of the abdomen and pelvis was  performed following the standard protocol without IV contrast. COMPARISON:  Renal ultrasound dated 01/05/2016 and CT dated 09/07/2012 the FINDINGS: Evaluation of this exam is limited in the absence of intravenous contrast. Lower chest: There is a 10 x 8 mm subpleural nodule at the right lung base (series 4, image 9). A 3 mm nodule in the lateral right lower lobe (series 4, image 4) may represent a calcified granuloma. There is coronary vascular calcification. There is no intra-abdominal free air. Small free fluid within the pelvis. Hepatobiliary: The liver is unremarkable. There is cholecystectomy changes. Pancreas: Unremarkable. No pancreatic ductal dilatation or surrounding inflammatory changes. Spleen: Normal in size without focal abnormality. Adrenals/Urinary Tract: The adrenal glands are unremarkable. There is an extrarenal pelvis on the right with mild pelviectasis. High attenuating content within the right renal collecting system and pelvis most consistent with blood product. This is of uncertain etiology but concerning for underlying neoplasm. Evaluation is very limited on this noncontrast CT. Further evaluation with CT urography or direct visualization with ureteroscopy recommended. There is no hydronephrosis or nephrolithiasis. There is mild right perinephric fluid. A 7 mm stone in the right pelvis (series 2, image 66) appears to be adjacent to the ureter and not in the right ureter. There is a 15 mm hypodense lesion in the upper pole of the left kidney which is not well characterized but likely represents a cyst. A subcentimeter high attenuating lesion in the inferior pole of the left kidney is also not characterized but may represent a complex/hemorrhagic cyst. There is no hydronephrosis or nephrolithiasis on the left. The left ureter is unremarkable. Stomach/Bowel: There is no evidence of bowel obstruction or active inflammation. Appendectomy. Vascular/Lymphatic: There is advanced aortoiliac  atherosclerotic disease. There is extensive atherosclerotic calcification of the mesenteric vasculature as well as bilateral renovascular calcification. The IVC is grossly unremarkable. No portal venous gas. There is no adenopathy. Reproductive: The uterus is retroflexed. The ovaries are grossly unremarkable. Other: There is diffuse subcutaneous edema and stranding. No fluid collection. Musculoskeletal: Osteopenia with degenerative changes of the spine. Grade 1 L4-L5 anterolisthesis. IMPRESSION: 1. Mild caliectasis of the right renal pelvis containing blood product. This is of indeterminate etiology but underlying mass is not excluded. Further evaluation with CT urography or direct visualization with ureteroscopy recommended. There is no hydronephrosis or nephrolithiasis on either side. 2. **An incidental finding of potential clinical significance has been found. A 10 mm right lung base subpleural nodule. Consider one of the following in 3 months for both low-risk and high-risk individuals: (a) repeat chest CT, (b) follow-up PET-CT, or (c) tissue sampling. This recommendation follows the consensus statement: Guidelines for Management of Incidental Pulmonary Nodules Detected on  CT Images: From the Fleischner Society 2017; Radiology 2017; [phone].** 3. Advanced atherosclerotic disease of the aorta Aortic Atherosclerosis (ICD10-I70.0). Atherosclerotic disease of the mesenteric vasculature as well as renovascular calcification. 4. No bowel obstruction or active inflammation. Electronically Signed   By: Anner Crete M.D.   On: 12/20/2016 20:24    ASSESSMENT/PLAN:  1. Acute renal failure superimposed on stage 4 chronic kidney disease, unspecified acute renal failure type ([place]) - creatinine 3.70 , she is  full code, not making any progress, has ordered nephrology consult last week   2. Leukocytosis, unspecified type - wbc = 16.9, she has completed Clindamycin treatment, no fever/coght/SOB, will need to be   transferred to hospital for further evaluation.   3. Nausea and Vomiting - she has poor PO intake and not able to keep fluids down   4. Chronic diastolic heart failure -  No SOB, held Lasix today due to nausea and vomiting     [name] C. Fedora - NP    [employer] [phone]

## 2017-01-10 NOTE — ED Notes (Signed)
Bed: [facility] Expected date:  Expected time:  Means of arrival:  Comments: EMS-abdominal pain

## 2017-01-11 ENCOUNTER — Other Ambulatory Visit: Payer: Self-pay

## 2017-01-11 DIAGNOSIS — D508 Other iron deficiency anemias: Secondary | ICD-10-CM | POA: Diagnosis not present

## 2017-01-11 DIAGNOSIS — D520 Dietary folate deficiency anemia: Secondary | ICD-10-CM

## 2017-01-11 DIAGNOSIS — N2889 Other specified disorders of kidney and ureter: Secondary | ICD-10-CM | POA: Diagnosis not present

## 2017-01-11 DIAGNOSIS — I2510 Atherosclerotic heart disease of native coronary artery without angina pectoris: Secondary | ICD-10-CM | POA: Diagnosis not present

## 2017-01-11 DIAGNOSIS — G3183 Dementia with Lewy bodies: Secondary | ICD-10-CM | POA: Diagnosis present

## 2017-01-11 DIAGNOSIS — K633 Ulcer of intestine: Secondary | ICD-10-CM | POA: Diagnosis not present

## 2017-01-11 DIAGNOSIS — E872 Acidosis: Secondary | ICD-10-CM | POA: Diagnosis not present

## 2017-01-11 DIAGNOSIS — N3001 Acute cystitis with hematuria: Secondary | ICD-10-CM | POA: Diagnosis present

## 2017-01-11 DIAGNOSIS — I5032 Chronic diastolic (congestive) heart failure: Secondary | ICD-10-CM | POA: Diagnosis not present

## 2017-01-11 DIAGNOSIS — I7090 Unspecified atherosclerosis: Secondary | ICD-10-CM | POA: Diagnosis not present

## 2017-01-11 DIAGNOSIS — D126 Benign neoplasm of colon, unspecified: Secondary | ICD-10-CM | POA: Diagnosis not present

## 2017-01-11 DIAGNOSIS — D649 Anemia, unspecified: Secondary | ICD-10-CM | POA: Diagnosis not present

## 2017-01-11 DIAGNOSIS — R488 Other symbolic dysfunctions: Secondary | ICD-10-CM | POA: Diagnosis not present

## 2017-01-11 DIAGNOSIS — E11319 Type 2 diabetes mellitus with unspecified diabetic retinopathy without macular edema: Secondary | ICD-10-CM | POA: Diagnosis present

## 2017-01-11 DIAGNOSIS — N281 Cyst of kidney, acquired: Secondary | ICD-10-CM | POA: Diagnosis present

## 2017-01-11 DIAGNOSIS — T886XXA Anaphylactic reaction due to adverse effect of correct drug or medicament properly administered, initial encounter: Secondary | ICD-10-CM | POA: Diagnosis not present

## 2017-01-11 DIAGNOSIS — L02214 Cutaneous abscess of groin: Secondary | ICD-10-CM | POA: Diagnosis not present

## 2017-01-11 DIAGNOSIS — N170 Acute kidney failure with tubular necrosis: Secondary | ICD-10-CM | POA: Diagnosis not present

## 2017-01-11 DIAGNOSIS — T782XXA Anaphylactic shock, unspecified, initial encounter: Secondary | ICD-10-CM | POA: Diagnosis not present

## 2017-01-11 DIAGNOSIS — K551 Chronic vascular disorders of intestine: Secondary | ICD-10-CM | POA: Diagnosis not present

## 2017-01-11 DIAGNOSIS — K921 Melena: Secondary | ICD-10-CM | POA: Diagnosis not present

## 2017-01-11 DIAGNOSIS — R1084 Generalized abdominal pain: Secondary | ICD-10-CM | POA: Diagnosis not present

## 2017-01-11 DIAGNOSIS — T782XXS Anaphylactic shock, unspecified, sequela: Secondary | ICD-10-CM | POA: Diagnosis not present

## 2017-01-11 DIAGNOSIS — N179 Acute kidney failure, unspecified: Secondary | ICD-10-CM | POA: Diagnosis not present

## 2017-01-11 DIAGNOSIS — N200 Calculus of kidney: Secondary | ICD-10-CM | POA: Diagnosis not present

## 2017-01-11 DIAGNOSIS — R195 Other fecal abnormalities: Secondary | ICD-10-CM | POA: Diagnosis not present

## 2017-01-11 DIAGNOSIS — K55059 Acute (reversible) ischemia of intestine, part and extent unspecified: Secondary | ICD-10-CM | POA: Diagnosis not present

## 2017-01-11 DIAGNOSIS — Q2730 Arteriovenous malformation, site unspecified: Secondary | ICD-10-CM | POA: Diagnosis not present

## 2017-01-11 DIAGNOSIS — J8410 Pulmonary fibrosis, unspecified: Secondary | ICD-10-CM | POA: Diagnosis present

## 2017-01-11 DIAGNOSIS — M6281 Muscle weakness (generalized): Secondary | ICD-10-CM | POA: Diagnosis not present

## 2017-01-11 DIAGNOSIS — D500 Iron deficiency anemia secondary to blood loss (chronic): Secondary | ICD-10-CM | POA: Diagnosis not present

## 2017-01-11 DIAGNOSIS — I959 Hypotension, unspecified: Secondary | ICD-10-CM | POA: Diagnosis not present

## 2017-01-11 DIAGNOSIS — J449 Chronic obstructive pulmonary disease, unspecified: Secondary | ICD-10-CM | POA: Diagnosis not present

## 2017-01-11 DIAGNOSIS — I701 Atherosclerosis of renal artery: Secondary | ICD-10-CM | POA: Diagnosis not present

## 2017-01-11 DIAGNOSIS — Y92230 Patient room in hospital as the place of occurrence of the external cause: Secondary | ICD-10-CM | POA: Diagnosis not present

## 2017-01-11 DIAGNOSIS — E1122 Type 2 diabetes mellitus with diabetic chronic kidney disease: Secondary | ICD-10-CM | POA: Diagnosis not present

## 2017-01-11 DIAGNOSIS — D123 Benign neoplasm of transverse colon: Secondary | ICD-10-CM | POA: Diagnosis not present

## 2017-01-11 DIAGNOSIS — I130 Hypertensive heart and chronic kidney disease with heart failure and stage 1 through stage 4 chronic kidney disease, or unspecified chronic kidney disease: Secondary | ICD-10-CM | POA: Diagnosis present

## 2017-01-11 DIAGNOSIS — D509 Iron deficiency anemia, unspecified: Secondary | ICD-10-CM | POA: Diagnosis not present

## 2017-01-11 DIAGNOSIS — Z66 Do not resuscitate: Secondary | ICD-10-CM | POA: Diagnosis present

## 2017-01-11 DIAGNOSIS — N189 Chronic kidney disease, unspecified: Secondary | ICD-10-CM | POA: Diagnosis not present

## 2017-01-11 DIAGNOSIS — I272 Pulmonary hypertension, unspecified: Secondary | ICD-10-CM | POA: Diagnosis present

## 2017-01-11 DIAGNOSIS — E440 Moderate protein-calorie malnutrition: Secondary | ICD-10-CM | POA: Diagnosis not present

## 2017-01-11 DIAGNOSIS — N184 Chronic kidney disease, stage 4 (severe): Secondary | ICD-10-CM | POA: Diagnosis not present

## 2017-01-11 DIAGNOSIS — N289 Disorder of kidney and ureter, unspecified: Secondary | ICD-10-CM | POA: Diagnosis not present

## 2017-01-11 DIAGNOSIS — I300 Acute nonspecific idiopathic pericarditis: Secondary | ICD-10-CM | POA: Diagnosis not present

## 2017-01-11 DIAGNOSIS — D62 Acute posthemorrhagic anemia: Secondary | ICD-10-CM | POA: Diagnosis present

## 2017-01-11 DIAGNOSIS — E1151 Type 2 diabetes mellitus with diabetic peripheral angiopathy without gangrene: Secondary | ICD-10-CM | POA: Diagnosis not present

## 2017-01-11 DIAGNOSIS — E785 Hyperlipidemia, unspecified: Secondary | ICD-10-CM | POA: Diagnosis not present

## 2017-01-11 DIAGNOSIS — K5190 Ulcerative colitis, unspecified, without complications: Secondary | ICD-10-CM | POA: Diagnosis not present

## 2017-01-11 DIAGNOSIS — I5042 Chronic combined systolic (congestive) and diastolic (congestive) heart failure: Secondary | ICD-10-CM | POA: Diagnosis not present

## 2017-01-11 LAB — MAGNESIUM: Magnesium: 1.7 mg/dL (ref 1.7–2.4)

## 2017-01-11 LAB — GLUCOSE, CAPILLARY
GLUCOSE-CAPILLARY: 122 mg/dL — AB (ref 65–99)
GLUCOSE-CAPILLARY: 62 mg/dL — AB (ref 65–99)
GLUCOSE-CAPILLARY: 79 mg/dL (ref 65–99)
Glucose-Capillary: 94 mg/dL (ref 65–99)
Glucose-Capillary: 62 mg/dL — ABNORMAL LOW (ref 65–99)
Glucose-Capillary: 113 mg/dL — ABNORMAL HIGH (ref 65–99)
Glucose-Capillary: 152 mg/dL — ABNORMAL HIGH (ref 65–99)
Glucose-Capillary: 158 mg/dL — ABNORMAL HIGH (ref 65–99)

## 2017-01-11 LAB — CBC
HEMATOCRIT: 22.1 % — AB (ref 36.0–46.0)
HEMOGLOBIN: 7.7 g/dL — AB (ref 12.0–15.0)
MCH: 30.1 pg (ref 26.0–34.0)
MCHC: 34.8 g/dL (ref 30.0–36.0)
MCV: 86.3 fL (ref 78.0–100.0)
Platelets: 215 10*3/uL (ref 150–400)
RBC: 2.56 MIL/uL — ABNORMAL LOW (ref 3.87–5.11)
RDW: 16 % — ABNORMAL HIGH (ref 11.5–15.5)
WBC: 9.5 10*3/uL (ref 4.0–10.5)

## 2017-01-11 LAB — RENAL FUNCTION PANEL
ALBUMIN: 1.7 g/dL — AB (ref 3.5–5.0)
ANION GAP: 11 (ref 5–15)
BUN: 64 mg/dL — ABNORMAL HIGH (ref 6–20)
CO2: 16 mmol/L — ABNORMAL LOW (ref 22–32)
Calcium: 7.5 mg/dL — ABNORMAL LOW (ref 8.9–10.3)
Chloride: 110 mmol/L (ref 101–111)
Creatinine, Ser: 3.26 mg/dL — ABNORMAL HIGH (ref 0.44–1.00)
GFR calc Af Amer: 16 mL/min — ABNORMAL LOW (ref >60)
GFR calc non Af Amer: 14 mL/min — ABNORMAL LOW (ref >60)
GLUCOSE: 69 mg/dL (ref 65–99)
PHOSPHORUS: 6.6 mg/dL — AB (ref 2.5–4.6)
POTASSIUM: 3.2 mmol/L — AB (ref 3.5–5.1)
Sodium: 137 mmol/L (ref 135–145)

## 2017-01-11 LAB — URINALYSIS, ROUTINE W REFLEX MICROSCOPIC
BILIRUBIN URINE: NEGATIVE
GLUCOSE, UA: NEGATIVE mg/dL
HGB URINE DIPSTICK: NEGATIVE
Ketones, ur: NEGATIVE mg/dL
NITRITE: NEGATIVE
PROTEIN: NEGATIVE mg/dL
Specific Gravity, Urine: 1.01 (ref 1.005–1.030)
pH: 5 (ref 5.0–8.0)

## 2017-01-11 LAB — IRON AND TIBC
Iron: 31 ug/dL (ref 28–170)
SATURATION RATIOS: 23 % (ref 10.4–31.8)
TIBC: 136 ug/dL — AB (ref 250–450)
UIBC: 105 ug/dL

## 2017-01-11 LAB — FERRITIN: Ferritin: 261 ng/mL (ref 11–307)

## 2017-01-11 LAB — C DIFFICILE QUICK SCREEN W PCR REFLEX
C DIFFICILE (CDIFF) INTERP: NOT DETECTED
C Diff antigen: NEGATIVE
C Diff toxin: NEGATIVE

## 2017-01-11 LAB — MRSA PCR SCREENING: MRSA BY PCR: NEGATIVE

## 2017-01-11 LAB — VITAMIN B12: VITAMIN B 12: 569 pg/mL (ref 180–914)

## 2017-01-11 LAB — OCCULT BLOOD X 1 CARD TO LAB, STOOL: Fecal Occult Bld: POSITIVE — AB

## 2017-01-11 LAB — TSH: TSH: 1.848 u[IU]/mL (ref 0.350–4.500)

## 2017-01-11 LAB — FOLATE: Folate: 4.9 ng/mL — ABNORMAL LOW (ref >5.9)

## 2017-01-11 MED ORDER — POTASSIUM CHLORIDE CRYS ER 20 MEQ PO TBCR
40.0000 meq | EXTENDED_RELEASE_TABLET | Freq: Once | ORAL | Status: AC
  Administered 2017-01-11: 40 meq via ORAL
  Filled 2017-01-11: qty 2

## 2017-01-11 MED ORDER — FOLIC ACID 1 MG PO TABS
1.0000 mg | ORAL_TABLET | Freq: Every day | ORAL | Status: DC
  Administered 2017-01-12 – 2017-01-17 (×6): 1 mg via ORAL
  Filled 2017-01-11 (×6): qty 1

## 2017-01-11 MED ORDER — GLUCERNA SHAKE PO LIQD
237.0000 mL | Freq: Three times a day (TID) | ORAL | Status: DC
  Administered 2017-01-11 – 2017-01-17 (×13): 237 mL via ORAL
  Filled 2017-01-11 (×21): qty 237

## 2017-01-11 MED ORDER — DEXTROSE 5 % IV SOLN
1.0000 g | INTRAVENOUS | Status: DC
  Administered 2017-01-11 – 2017-01-17 (×7): 1 g via INTRAVENOUS
  Filled 2017-01-11 (×7): qty 10

## 2017-01-11 MED ORDER — CALCIUM CARBONATE ANTACID 500 MG PO CHEW
1.0000 | CHEWABLE_TABLET | Freq: Three times a day (TID) | ORAL | Status: DC
  Administered 2017-01-12 – 2017-01-17 (×16): 200 mg via ORAL
  Filled 2017-01-11 (×16): qty 1

## 2017-01-11 NOTE — Care Management Note (Signed)
Case Management Note  Patient Details  Name: [name] MRN: [id number] Date of Birth: 01/05/1951  Subjective/Objective: 66 y/o f admitted w/AKI. From SNF. Hx: dementia. Spoke to [employer] on phone [phone] about obserbvation, status, & d/c plan-if PT recc SNF then patient will d/c back to SNF-CM informed dtr of [facility] intermittent services. Dtr preferred SNF. CSW already following.                  Action/Plan:d/c SNF.   Expected Discharge Date:  (unknown)               Expected Discharge Plan:  Skilled Nursing Facility  In-House Referral:  Clinical Social Work  Discharge planning Services  CM Consult  Post Acute Care Choice:    Choice offered to:     DME Arranged:    DME Agency:     HH Arranged:    [place] Agency:     Status of Service:  In process, will continue to follow  If discussed at Long Length of Stay Meetings, dates discussed:    Additional Comments:  Dessa Phi, RN 01/11/2017, 3:18 PM

## 2017-01-11 NOTE — Care Management Obs Status (Signed)
[facility] NOTIFICATION   Patient Details  Name: [name] MRN: [id number] Date of Birth: [birth year]   Medicare Observation Status Notification Given:  Yes    MahabirJuliann Pulse, RN 01/11/2017, 1:23 PM

## 2017-01-11 NOTE — Evaluation (Signed)
Physical Therapy Evaluation Patient Details Name: [name] MRN: [id number] DOB: 14-Dec-1950 Today's Date: 01/11/2017   History of Present Illness  66 y.o. female with history of diabetes, hypertension, hyperlipidemia, CVA, peripheral vascular disease, multivessel CAD, chronic systolic heart failure, dementia, iron deficiency anemia, possible right kidney mass and right renal pelvis nephrolith who was admitted from 11/6 to 11/8 due to a left groin abscess and intertrigo.  Pt admitted from SNF for Acute on chronic kidney disease, stage 4, with nongap metabolic acidosis  Clinical Impression  Pt admitted with above diagnosis. Pt currently with functional limitations due to the deficits listed below (see PT Problem List).  Pt will benefit from skilled PT to increase their independence and safety with mobility to allow discharge to the venue listed below.  Pt assisted with standing however had loose BM so assisted to [facility] and then recliner.  Pt reports she was working with PT in SNF prior to admission.     Follow Up Recommendations SNF;Supervision/Assistance - 24 hour    Equipment Recommendations  None recommended by PT    Recommendations for Other Services       Precautions / Restrictions Precautions Precautions: Fall Precaution Comments: incontinent BM Restrictions Weight Bearing Restrictions: No      Mobility  Bed Mobility Overal bed mobility: Needs Assistance Bed Mobility: Supine to Sit     Supine to sit: Supervision;HOB elevated        Transfers Overall transfer level: Needs assistance Equipment used: Rolling walker (2 wheeled) Transfers: Stand Pivot Transfers;Sit to/from Stand Sit to Stand: Min guard Stand pivot transfers: Min guard       General transfer comment: min guard for safety, pt with loose BM upon standing, pivoted to BSC, replaced recliner with BSC after performing pericare; pt felt unable to tolerate ambulating at this time  Ambulation/Gait                 Stairs            Wheelchair Mobility    Modified Rankin (Stroke Patients Only)       Balance                                             Pertinent Vitals/Pain Pain Assessment: No/denies pain    Home Living Family/patient expects to be discharged to:: Skilled nursing facility Living Arrangements: Spouse/significant other Available Help at Discharge: Family;Available 24 hours/day   Home Access: Stairs to enter Entrance Stairs-Rails: Can reach both;Right;Left Entrance Stairs-Number of Steps: 2 Home Layout: One level Home Equipment: Walker - 2 wheels;Cane - single point;Shower seat      Prior Function Level of Independence: Needs assistance   Gait / Transfers Assistance Needed: per chart, was ambulatory at home with assistive device, admitted from SNF and pt states she was participating in PT and working on standing and ambulating           Hand Dominance        Extremity/Trunk Assessment   Upper Extremity Assessment Upper Extremity Assessment: Generalized weakness    Lower Extremity Assessment Lower Extremity Assessment: Generalized weakness       Communication   Communication: No difficulties  Cognition Arousal/Alertness: Awake/alert Behavior During Therapy: WFL for tasks assessed/performed Overall Cognitive Status: History of cognitive impairments - at baseline  Problem Solving: Slow processing;Difficulty sequencing General Comments: pt with dementia at baseline; no family/caregivers present during session      General Comments      Exercises     Assessment/Plan    PT Assessment Patient needs continued PT services  PT Problem List Decreased strength;Decreased activity tolerance;Decreased balance;Decreased mobility;Decreased coordination;Decreased knowledge of use of DME;Decreased safety awareness       PT Treatment Interventions DME instruction;Therapeutic  activities;Gait training;Functional mobility training;Balance training;Patient/family education;Therapeutic exercise    PT Goals (Current goals can be found in the Care Plan section)  Acute Rehab PT Goals PT Goal Formulation: With patient Time For Goal Achievement: 01/25/17 Potential to Achieve Goals: Good    Frequency Min 2X/week   Barriers to discharge        Co-evaluation               AM-PAC PT "6 Clicks" Daily Activity  Outcome Measure Difficulty turning over in bed (including adjusting bedclothes, sheets and blankets)?: A Little Difficulty moving from lying on back to sitting on the side of the bed? : A Little Difficulty sitting down on and standing up from a chair with arms (e.g., wheelchair, bedside commode, etc,.)?: Unable Help needed moving to and from a bed to chair (including a wheelchair)?: A Little Help needed walking in hospital room?: A Lot Help needed climbing 3-5 steps with a railing? : A Lot 6 Click Score: 14    End of Session   Activity Tolerance: Patient limited by fatigue Patient left: in chair;with call bell/phone within reach Nurse Communication: Mobility status(NT aware pt up in recliner, needs bed linen change) PT Visit Diagnosis: Other abnormalities of gait and mobility (R26.89)    Time: 9924-2683 PT Time Calculation (min) (ACUTE ONLY): 19 min   Charges:   PT Evaluation $PT Eval Low Complexity: 1 Low     PT G Codes:   PT G-Codes **NOT FOR INPATIENT CLASS** Functional Assessment Tool Used: AM-PAC 6 Clicks Basic Mobility;Clinical judgement Functional Limitation: Mobility: Walking and moving around Mobility: Walking and Moving Around Current Status (M1962): At least 40 percent but less than 60 percent impaired, limited or restricted Mobility: Walking and Moving Around Goal Status [phone]): At least 1 percent but less than 20 percent impaired, limited or restricted    Carmelia Bake, PT, DPT 01/11/2017 Pager: [phone]  York Ram  E 01/11/2017, 12:12 PM

## 2017-01-11 NOTE — NC FL2 (Signed)
[place] LEVEL OF CARE SCREENING TOOL     IDENTIFICATION  Patient Name: [name] KINN Birthdate: March 08, 1950 Sex: female Admission Date (Current Location): 01/10/2017  [facility] and [place] Number:  Herbalist and Address:  [facility],  [place] [address], [place]      Provider Number: [id number]  Attending Physician Name and Address:  Janece Canterbury, MD  Relative Name and Phone Number:  Trina Asch - spouse; [phone]     Current Level of Care: Hospital Recommended Level of Care: [place] Prior Approval Number:    Date Approved/Denied:   PASRR Number: [id number]  Discharge Plan: SNF    Current Diagnoses: Patient Active Problem List   Diagnosis Date Noted  . Acute-on-chronic kidney injury ([place]) 01/10/2017  . Right renal mass 12/30/2016  . Caliectasis 12/27/2016  . Anemia 12/27/2016  . Skin breakdown 12/27/2016  . Boil 12/27/2016  . Hematuria 12/27/2016  . Abscess of groin   . Bilateral carotid artery stenosis 08/22/2016  . Lung nodule 06/29/2016  . Acute kidney injury superimposed on chronic kidney disease ([place]) 06/23/2016  . Acute on chronic combined systolic and diastolic CHF (congestive heart failure) ([place]) 06/23/2016  . Hypokalemia 06/23/2016  . Acute esophagitis   . Angiodysplasia of duodenum   . Symptomatic anemia 06/21/2016  . Cardiac enzymes elevated   . Elevated liver enzymes   . Transaminitis   . Demand ischemia ([place])   . Coffee ground emesis   . Heme positive stool   . Dementia 03/04/2016  . CAP (community acquired pneumonia) 05/13/2015  . Indwelling catheter present on admission 04/21/2015  . Difficulty breathing   . Pulmonary infiltrates   . Staphylococcus aureus bacteremia   . Palliative care encounter   . Malnutrition of moderate degree 03/30/2015  . HCAP (healthcare-associated pneumonia) 03/29/2015  . Acute respiratory failure ([place]) 03/29/2015  . Hypomagnesemia 03/29/2015   . Low serum vitamin D 03/29/2015  . Metabolic acidosis 76/81/1572  . Coronary artery disease involving native coronary artery of native heart with angina pectoris ([place])   . Sacral ulcer ([place])   . Urinary retention   . Hypocalcemia 03/20/2015  . Lewy body dementia without behavioral disturbance   . AKI (acute kidney injury) ([place])   . Protein-calorie malnutrition ([place]) 01/24/2015  . Abnormal nuclear stress test   . Stage 4 chronic kidney disease ([place])   . Carotid artery disease ([place])   . CAD (coronary artery disease)   . Chronic diastolic CHF (congestive heart failure) ([place])   . Essential hypertension   . Former tobacco use   . Bradycardia   . UTI (urinary tract infection) 01/27/2012  . Nausea & vomiting 01/26/2012  . 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 Diet-controlled diabetes mellitus ([place]) 01/25/2012  . Iron deficiency anemia 01/25/2012  . Renal artery stenosis ([place]) 11/19/2010  . Peripheral vascular disease ([place]) 11/19/2010  . Diabetes mellitus with complication ([place]) 62/04/5595  . Hyperlipidemia 05/16/2008  . OBESITY 05/16/2008  . Anemia, chronic disease 05/16/2008  . ANXIETY DEPRESSION 05/16/2008  . History of CVA (cerebrovascular accident) 05/16/2008  . Chronic obstructive pulmonary disease ([place]) 05/16/2008  . DIVERTICULITIS, HX OF 05/16/2008    Orientation RESPIRATION BLADDER Height & Weight     Self, Time, Situation, Place  Normal Incontinent Weight: 108 lb 14.5 oz (49.4 kg) Height:     BEHAVIORAL SYMPTOMS/MOOD NEUROLOGICAL BOWEL NUTRITION STATUS        Diet(see dc summary)  AMBULATORY STATUS COMMUNICATION OF NEEDS Skin   Limited Assist Verbally PU Stage and Appropriate Care, Other (Comment)( Wound/Incision(OpenorDehisced)11/07/18PunctureGroinAnterior;LeftI&Dsite  Gauze Dressing changed daily)   PU Stage 2 Dressing:  TID(StageII-Partialthicknesslossofdermispresentingasashallowopenulcerwithared,pinkwoundbedwithoutslough.   Location: Coccyx Location Orientation: Posterior)                   Personal Care Assistance Level of Assistance  Bathing, Feeding, Dressing Bathing Assistance: Limited assistance Feeding assistance: Independent Dressing Assistance: Limited assistance     Functional Limitations Info  Sight, Hearing, Speech Sight Info: Adequate Hearing Info: Adequate Speech Info: Adequate    SPECIAL CARE FACTORS FREQUENCY  OT (By licensed OT), PT (By licensed PT)     PT Frequency: 5x OT Frequency: 5x            Contractures Contractures Info: Not present    Additional Factors Info  Code Status, Allergies, Psychotropic, Isolation Precautions Code Status Info: DNR Allergies Info: NKA Psychotropic Info: Zoloft   Isolation Precautions Info: Enteric Precautions     Current Medications (01/11/2017):  This is the current hospital active medication list Current Facility-Administered Medications  Medication Dose Route Frequency Provider Last Rate Last Dose  . acetaminophen (TYLENOL) tablet 650 mg  650 mg Oral Q6H PRN Short, Mackenzie, MD      . albuterol (PROVENTIL) (2.5 MG/3ML) 0.083% nebulizer solution 2.5 mg  2.5 mg Inhalation Q6H PRN Short, Mackenzie, MD      . aspirin EC tablet 81 mg  81 mg Oral Daily Janece Canterbury, MD   81 mg at 01/11/17 1044  . atorvastatin (LIPITOR) tablet 80 mg  80 mg Oral q1800 Janece Canterbury, MD   80 mg at 01/10/17 2027  . cefTRIAXone (ROCEPHIN) 1 g in dextrose 5 % 50 mL IVPB  1 g Intravenous Q24H Berton Mount, RPH   Stopped at 01/11/17 1201  . donepezil (ARICEPT) tablet 10 mg  10 mg Oral Hettie Holstein, MD   10 mg at 01/10/17 2026  . feeding supplement (GLUCERNA SHAKE) (GLUCERNA SHAKE) liquid 237 mL  237 mL Oral TID BM Janece Canterbury, MD   237 mL at 01/11/17 1358  . ferrous sulfate tablet 325 mg  325 mg Oral BID WC  Janece Canterbury, MD   325 mg at 01/11/17 0834  . [START ON 94/17/4081] folic acid (FOLVITE) tablet 1 mg  1 mg Oral Daily Short, Mackenzie, MD      . heparin injection 5,000 Units  5,000 Units Subcutaneous Christiana Pellant, MD   5,000 Units at 01/11/17 1357  . omega-3 acid ethyl esters (LOVAZA) capsule 1 g  1 g Oral BID Janece Canterbury, MD   1 g at 01/11/17 1045  . ondansetron (ZOFRAN) tablet 4 mg  4 mg Oral Q6H PRN Short, Mackenzie, MD       Or  . ondansetron (ZOFRAN) injection 4 mg  4 mg Intravenous Q6H PRN Short, Mackenzie, MD      . pantoprazole (PROTONIX) EC tablet 40 mg  40 mg Oral Daily Short, Mackenzie, MD   40 mg at 01/11/17 1045  . promethazine (PHENERGAN) tablet 25 mg  25 mg Oral Q6H PRN Short, Mackenzie, MD      . saccharomyces boulardii (FLORASTOR) capsule 250 mg  250 mg Oral BID Janece Canterbury, MD   250 mg at 01/11/17 1044  . sertraline (ZOLOFT) tablet 50 mg  50 mg Oral Daily Short, Mackenzie, MD   50 mg at 01/11/17 1044  . sodium bicarbonate 150 mEq in sterile water 1,000 mL infusion  Intravenous Continuous Janece Canterbury, MD 125 mL/hr at 01/11/17 1040    . sodium chloride flush (NS) 0.9 % injection 3 mL  3 mL Intravenous Q12H Short, Mackenzie, MD      . traMADol Veatrice Bourbon) tablet 50 mg  50 mg Oral BID Janece Canterbury, MD   50 mg at 01/11/17 1044     Discharge Medications: Please see discharge summary for a list of discharge medications.  Relevant Imaging Results:  Relevant Lab Results:   Additional Information ss#[id number]  Burnis Medin, LCSW

## 2017-01-11 NOTE — Progress Notes (Signed)
Per chart review, PT recommending SNF. CSW followed up with patient and patient's daughter to discuss discharge plan, patient and patient's daughter verbalized plan to discharge back to SNF to complete rehab. CSW contacted [facility] Rehab SN[facility], staff confirmed patient's ability to return to facility when medically stable for dc. CSW  will complete FL2 and continue to follow and assist patient discharge planning.  Abundio Miu, [place] Social Worker [facility] Cell#: [phone]

## 2017-01-11 NOTE — Progress Notes (Addendum)
Pharmacy Antibiotic Note  CENIA ZARAGOSA is a 66 y.o. female admitted on 01/10/2017 with UTI.  Pharmacy has been consulted for ceftriaxone dosing.  UA: + epi, + pyuria Note 10/30 UCx revealed E. Coli (R to cipro, gent, TMP/SMZ)  Plan:  Ceftriaxone 1gm IV q24h  Consider stopping antibiotics if no symptoms of UTI (UA consistent with not being a clean catch w/ inc epithelial cells)  If antibiotics to continue, consider narrow abx (recent culture suggests could narrow to ampicillin IV then PO amoxicillin)  No renal dose adjustment required, pharmacy will sign-off  Weight: 108 lb 14.5 oz (49.4 kg)  Temp (24hrs), Avg:98 F (36.7 C), Min:97.7 F (36.5 C), Max:98.5 F (36.9 C)  Recent Labs  Lab 01/10/17 1430 01/11/17 0519  WBC 17.8* 9.5  CREATININE 3.71* 3.26*    Estimated Creatinine Clearance: 11.6 mL/min (A) (by C-G formula based on SCr of 3.26 mg/dL (H)).    No Known Allergies  Thank you for allowing pharmacy to be a part of this patient's care.  Doreene Eland, PharmD, BCPS.   Pager: [phone] 01/11/2017 7:54 AM

## 2017-01-11 NOTE — Progress Notes (Signed)
Hypoglycemic Event  CBG: 62  Treatment: 15 GM carbohydrate snack  Symptoms: None  Follow-up CBG: DGLO:7564 CBG Result:122  Possible Reasons for Event: Inadequate meal intake and Unknown  Comments/MD notified:Pt required much encouragement to eat/drink    Fatumata Kashani A

## 2017-01-11 NOTE — Progress Notes (Addendum)
PROGRESS NOTE  [name]  NWG:[id number] DOB: 1950/05/27 DOA: 01/10/2017 PCP: Arnoldo Morale, MD  Brief Narrative:   The patient is a 66 year old female with history of diabetes, hypertension, hyperlipidemia, stroke, peripheral vascular disease, coronary artery disease, chronic systolic heart failure with an ejection fraction of 25 just 30%, dementia, iron deficiency anemia, who presented with abdominal pain, nausea, dehydration, dehydration, with acute kidney injury.  She was found to have a urinary tract infection.  She had a CT scan of the abdomen and pelvis which did not demonstrate any obstruction secondary to her right kidney stone and there is no other acute change to explain worsening abdominal pain.  She did, however, is expected to have evidence of severe diffuse atherosclerosis with probable stenosis of the mesenteric and celiac arteries which would be consistent with chronic mesenteric ischemia which I think is her primary underlying problem.  She is feeling better with IV fluids and antibiotics.  Her hemoglobin trended down over the last 24 hours.  If her hemoglobin has stabilized tomorrow and she continues to feel better, she is possibly ready for discharge tomorrow with close follow up with GI and vascular surgery  Assessment & Plan:   Principal Problem:   Acute kidney injury superimposed on chronic kidney disease ([place]) Active Problems:   Chronic obstructive pulmonary disease ([place])   Renal artery stenosis (HCC)   Diet-controlled diabetes mellitus ([place])   Iron deficiency anemia   Stage 4 chronic kidney disease (HCC)   CAD (coronary artery disease)   Chronic diastolic CHF (congestive heart failure) ([place])   Protein-calorie malnutrition ([place])   Dementia   Anemia   Abscess of groin  Acute on chronic kidney disease, stage 4, with nongap metabolic acidosis, on baseline creatinine is probably around 2.6g/dl and was initially 3.71 g/dl.  Likely has some ATN from dehydration and  some CKD related to vascular disease (including renovascular disease), hypertension, and diabetes.   -Creatinine trending down -  Continue sodium bicarbonate fluids -  Minimize nephrotoxins -  Renally dose medications -  Hold lasix -  Repeat BMP in a.m.  Nausea, vomiting, abdominal pain and diarrhea.  LFTs stable.  Lipase only minimally elevated.    Likely secondary to chronic mesenteric ischemia with a concomitant UTI -  Continue ceftriaxone -  Follow-up urine culture -  Zofran prn with phenergan for breakthrough -  C. Diff negative and GI pathogen PCR pending  Moderate malnutrition -Appreciate nutrition assistance -Continue regular diet with supplements  Previous multiple GI bleeds with erosive esophagitis, now with streaks of blood in emesis and stools, heme globin trending down, acute on chronic anemia.   -  Occult stool positive -  Continue PPI -  Folic acid low.  Iron level and vitamin B12 wnl.  Hematuria due to right renal mass and 71mm stone -  F/u with Urology. Dr. Janina Mayo at already scheduled appointment in Dec -  Right renal abnormality appears to be resolved on CT from yesterday.  She has some subcentimeter lesions on both kidneys likely representing cysts.  There was a hyper attenuating lesion on the lower pole of the left kidney which is thought to be a hemorrhagic cyst and there is scarring in the midpole of the right kidney.  Diabetes mellitus type 2, A1c 5.6 in July, hypoglycemic overnight -  DC low dose SSI   Dementia, mild to moderate, needs reorientation, husband gives meds to her, daughter organizes -  Continue aricept  Chronic diastolic and systolic heart failure, EF  40-45% most recently, blood pressure still mildly hypotensive -  Hold blood pressure medications due to hypotension -  Hold lasix due to dehydration  CAD, not candidate for PCI multivessel disease - Hold Coreg and hydralazine due to hypotension -  Continue atorvastatin - Continue  aspirin but hold if signs of bleeding (see below)  Peripheral vascular disease status post left common iliac and external iliac artery stents as well as prior CVA on Plavix - continue aspirin  COPD, stable, continue albuterol  Left groin abscess.  Continue once daily silver dressing with dry dressing changes  Generalized weakness -  PT/OT recommending skilled nursing  Iron deficiency anemia, on supplementation but worsening anemia -  Iron studies, B12, folate:  Folate deficiency, start folic acid -  TSH 9.604 -  Occult stool positive -  Repeat hgb in AM -  Continue iron supplementation  Leukocytosis, likely secondary to dehydration and UTI, resolved.  Hypokalemia, oral potassium repletion  Hyperphosphatemia, likely due to CKD.  Start TUMS TID  DVT prophylaxis:  heparin Code Status: DNR Family Communication: Patient alone Disposition Plan: Possibly ready for discharge tomorrow if her hemoglobin is stable and her creatinine continues to trend down.  Anticipate she will go back to skilled nursing facility for rehabilitation.   Consultants:   None  Procedures:  None  Antimicrobials:  Anti-infectives (From admission, onward)   Start     Dose/Rate Route Frequency Ordered Stop   01/11/17 1000  cefTRIAXone (ROCEPHIN) 1 g in dextrose 5 % 50 mL IVPB     1 g 100 mL/hr over 30 Minutes Intravenous Every 24 hours 01/11/17 0752         Subjective:  Patient states that she feels much better today.  Her energy is a little better.  Her abdominal pains have improved some and she was able to eat some breakfast and part of her lunch.  She denies any nausea or vomiting.  She continues to have some watery stools and states that his stool was sent for testing earlier.  Objective: Vitals:   01/11/17 0500 01/11/17 0645 01/11/17 0653 01/11/17 1429  BP:   (!) 110/48 93/67  Pulse:  (!) 57  (!) 59  Resp:  18  18  Temp:  98.5 F (36.9 C)  98.2 F (36.8 C)  TempSrc:  Oral  Oral    SpO2:      Weight: 49.5 kg (109 lb 2 oz) 49.4 kg (108 lb 14.5 oz)      Intake/Output Summary (Last 24 hours) at 01/11/2017 1930 Last data filed at 01/11/2017 1400 Gross per 24 hour  Intake 2665 ml  Output -  Net 2665 ml   Filed Weights   01/11/17 0500 01/11/17 0645  Weight: 49.5 kg (109 lb 2 oz) 49.4 kg (108 lb 14.5 oz)    Examination:  General exam:  Adult female, eyes still appear sunken, skin still dry.  No acute distress.  HEENT:  NCAT, MMM Respiratory system: Clear to auscultation bilaterally Cardiovascular system: Regular rate and rhythm, normal S1/S2. No murmurs, rubs, gallops or clicks.  Warm extremities Gastrointestinal system: Normal active bowel sounds, soft, nondistended, nontender. MSK:  Normal tone and bulk, no lower extremity edema Neuro:  Grossly intact    Data Reviewed: I have personally reviewed following labs and imaging studies  CBC: Recent Labs  Lab 01/10/17 1430 01/11/17 0519  WBC 17.8* 9.5  NEUTROABS 16.5*  --   HGB 9.7* 7.7*  HCT 29.2* 22.1*  MCV 87.7 86.3  PLT  270 676   Basic Metabolic Panel: Recent Labs  Lab 01/10/17 1430 01/11/17 0519  NA 135 137  K 4.0 3.2*  CL 115* 110  CO2 10* 16*  GLUCOSE 125* 69  BUN 70* 64*  CREATININE 3.71* 3.26*  CALCIUM 8.1* 7.5*  MG  --  1.7  PHOS  --  6.6*   GFR: Estimated Creatinine Clearance: 11.6 mL/min (A) (by C-G formula based on SCr of 3.26 mg/dL (H)). Liver Function Tests: Recent Labs  Lab 01/10/17 1430 01/11/17 0519  AST 20  --   ALT 16  --   ALKPHOS 93  --   BILITOT 0.6  --   PROT 5.3*  --   ALBUMIN 2.1* 1.7*   Recent Labs  Lab 01/10/17 1430  LIPASE 70*   No results for input(s): AMMONIA in the last 168 hours. Coagulation Profile: No results for input(s): INR, PROTIME in the last 168 hours. Cardiac Enzymes: No results for input(s): CKTOTAL, CKMB, CKMBINDEX, TROPONINI in the last 168 hours. BNP (last 3 results) No results for input(s): PROBNP in the last 8760  hours. HbA1C: No results for input(s): HGBA1C in the last 72 hours. CBG: Recent Labs  Lab 01/11/17 0036 01/11/17 0808 01/11/17 0859 01/11/17 1150 01/11/17 1656  GLUCAP 79 62* 122* 113* 152*   Lipid Profile: No results for input(s): CHOL, HDL, LDLCALC, TRIG, CHOLHDL, LDLDIRECT in the last 72 hours. Thyroid Function Tests: Recent Labs    01/11/17 0519  TSH 1.848   Anemia Panel: Recent Labs    01/11/17 0519  VITAMINB12 569  FOLATE 4.9*  FERRITIN 261  TIBC 136*  IRON 31   Urine analysis:    Component Value Date/Time   COLORURINE YELLOW 01/11/2017 0545   APPEARANCEUR CLOUDY (A) 01/11/2017 0545   LABSPEC 1.010 01/11/2017 0545   PHURINE 5.0 01/11/2017 0545   GLUCOSEU NEGATIVE 01/11/2017 0545   HGBUR NEGATIVE 01/11/2017 0545   BILIRUBINUR NEGATIVE 01/11/2017 0545   BILIRUBINUR negative 08/11/2015 1513   KETONESUR NEGATIVE 01/11/2017 0545   PROTEINUR NEGATIVE 01/11/2017 0545   UROBILINOGEN 0.2 08/11/2015 1513   UROBILINOGEN 0.2 01/08/2015 1215   NITRITE NEGATIVE 01/11/2017 0545   LEUKOCYTESUR LARGE (A) 01/11/2017 0545   Sepsis Labs: @LABRCNTIP (procalcitonin:4,lacticidven:4)  ) Recent Results (from the past 240 hour(s))  MRSA PCR Screening     Status: None   Collection Time: 01/11/17  5:40 AM  Result Value Ref Range Status   MRSA by PCR NEGATIVE NEGATIVE Final    Comment:        The GeneXpert MRSA Assay (FDA approved for NASAL specimens only), is one component of a comprehensive MRSA colonization surveillance program. It is not intended to diagnose MRSA infection nor to guide or monitor treatment for MRSA infections.   C difficile quick scan w PCR reflex     Status: None   Collection Time: 01/11/17 12:16 PM  Result Value Ref Range Status   C Diff antigen NEGATIVE NEGATIVE Final   C Diff toxin NEGATIVE NEGATIVE Final   C Diff interpretation No C. difficile detected.  Final      Radiology Studies: Ct Abdomen Pelvis Wo Contrast  Result Date:  01/10/2017 CLINICAL DATA:  Worsening kidney function, increased white cell count, and epigastric abdominal pain. EXAM: CT ABDOMEN AND PELVIS WITHOUT CONTRAST TECHNIQUE: Multidetector CT imaging of the abdomen and pelvis was performed following the standard protocol without IV contrast. COMPARISON:  12/20/2016 FINDINGS: Lower chest: 7 x 12 mm nodule in the right lung base anteriorly without change  since previous study. The nodule was present without change on previous CT chest from 05/12/2015. Continued follow-up until 2 year stability can be demonstrated is warranted. Slight fibrosis in the lung bases. Calcified granuloma in the right lung base. Hepatobiliary: Surgical absence of the gallbladder. No bile duct dilatation. Vascular calcifications in the hepatic hilum. Pancreas: Unremarkable. No pancreatic ductal dilatation or surrounding inflammatory changes. Spleen: Normal in size without focal abnormality. Adrenals/Urinary Tract: No adrenal gland nodules. Prominent vascular calcifications in the renal artery's hand renal hilar vessels. No hydronephrosis or hydroureter. Subcentimeter lesions on both kidneys likely representing cysts. Hyperattenuating lesion on the lower pole left kidney is probably a hemorrhagic cyst. No change since previous study. Focal scarring in the midpole right kidney. Bladder is mostly decompressed. Mild diffuse bladder wall thickening may indicate cystitis. Correlation with urinalysis is recommended. No filling defects demonstrated. Stomach/Bowel: The stomach, small bowel, and colon are not abnormally distended. No wall thickening or inflammatory changes are appreciated. Under distention limits evaluation. Contrast material and stool throughout the colon. No inflammatory changes. Appendix is not identified. Vascular/Lymphatic: Extensive vascular calcifications throughout the abdominal aorta and major branch vessels. There is evidence of calcific stenosis of the lower aorta and extending  into both iliac arteries. Likely there areas of high-grade stenosis in the iliac and external iliac arteries bilaterally. Prominent calcification in the mesenteric arteries and celiac axis artery's likely also results in significant stenosis. Reproductive: Uterus and ovaries are not enlarged. Other: No free air or free fluid in the abdomen. Abdominal wall musculature appears intact. Musculoskeletal: Degenerative changes in the spine. No destructive bone lesions. IMPRESSION: 1. Extensive aortic atherosclerosis and extensive calcific atherosclerosis throughout the abdominal vasculature. Likely there are areas of significant stenosis in multiple vessels. 2. No evidence of bowel obstruction or inflammation. 3. Subcentimeter lesions in both kidneys likely represent cysts although too small to characterize. Hemorrhagic cyst on the left kidney. 4. Unchanged appearance of a 7 x 12 mm nodule in the right lung base. Six-month follow-up study recommended to demonstrate continued stability. 5. Bladder wall thickening may indicate cystitis. Correlation with urinalysis recommended. Electronically Signed   By: Lucienne Capers M.D.   On: 01/10/2017 21:22     Scheduled Meds: . aspirin EC  81 mg Oral Daily  . atorvastatin  80 mg Oral q1800  . donepezil  10 mg Oral QHS  . feeding supplement (GLUCERNA SHAKE)  237 mL Oral TID BM  . ferrous sulfate  325 mg Oral BID WC  . [START ON 10/93/2355] folic acid  1 mg Oral Daily  . heparin  5,000 Units Subcutaneous Q8H  . omega-3 acid ethyl esters  1 g Oral BID  . pantoprazole  40 mg Oral Daily  . saccharomyces boulardii  250 mg Oral BID  . sertraline  50 mg Oral Daily  . sodium chloride flush  3 mL Intravenous Q12H  . traMADol  50 mg Oral BID   Continuous Infusions: . cefTRIAXone (ROCEPHIN)  IV Stopped (01/11/17 1201)  .  sodium bicarbonate (isotonic) infusion in sterile water 125 mL/hr at 01/11/17 1040     LOS: 0 days    Time spent: 30 min    Janece Canterbury,  MD [facility] Pager [phone]  If 7PM-7AM, please contact night-coverage [email or web address] Password [email or web address] 01/11/2017, 7:30 PM

## 2017-01-11 NOTE — Progress Notes (Signed)
Initial Nutrition Assessment  DOCUMENTATION CODES:   Non-severe (moderate) malnutrition in context of chronic illness  INTERVENTION:   RD to encourage PO intake Glucerna Shake po TID, each supplement provides 220 kcal and 10 grams of protein  NUTRITION DIAGNOSIS:   Moderate Malnutrition related to chronic illness(dementia, CKD IV) as evidenced by 8% weight loss in 1 month, moderate fat depletion, moderate muscle depletion, severe muscle depletion.  GOAL:   Patient will meet greater than or equal to 90% of their needs  MONITOR:   PO intake, Supplement acceptance, Weight trends, Labs  REASON FOR ASSESSMENT:   Consult Assessment of nutrition requirement/status  ASSESSMENT:   Pt coming from [facility] skilled nursing facility with PMH significant for DM, HTN, HLD, CVA, PVD, CAD, CKD IV, systolic CHF, dementia, previous GI bleeds with erosive esophagitis, and possible right kidney mass/right renal pelvis nephrolithiasis. Recently admitted 11/6 to 11/8 for left groin abscess that was drained. Pt was discharge to SNF with antibiotics. Pt has become very weak not getting up to use the bathroom. Presents this admission with AKI superimposed on CKD IV    No family at bedside to obtain nutrition history. Pt unable to answer questions.  RD observed lunch tray at bedside (spaghetti and salad) with 50% eaten and an Ensure with 75% remaining. Pt states she enjoys supplementation. Pt denies pain upon swallowing.  Pt unaware of any weight loss. Records indicate pt weighed 118 lb in October 2018 and 108 lb this admission. This shows an 8% wt loss in one month which is significant. Pt was seen by Clinical Dietitian 6 months ago to which pt consistently weighed 125-130 lb. Suspect this weight loss is accurate with some fluid fluctuation given history of CHF. Will need to obtain more information about intake and weight history from daughter.  Nutrition-Focused physical exam completed. Pt showing  moderate to severe depletions. Suspect pt could be severely malnourished but not enough evidence to diagnose at this time.   Medications reviewed and include: ferrous sulfate, folic acid, lovaza, IV abx, sodium bicarb Labs reviewed: K 3.2 (L) Creatinine 3.26 (H) Phosphorus 6.6 (H)  NUTRITION - FOCUSED PHYSICAL EXAM:    Most Recent Value  Orbital Region  Mild depletion  Upper Arm Region  Moderate depletion  Thoracic and Lumbar Region  Unable to assess  Buccal Region  Moderate depletion  Temple Region  Moderate depletion  Clavicle Bone Region  Mild depletion  Clavicle and Acromion Bone Region  Mild depletion  Scapular Bone Region  Unable to assess  Dorsal Hand  Mild depletion  Patellar Region  Severe depletion  Anterior Thigh Region  Severe depletion  Posterior Calf Region  Severe depletion  Edema (RD Assessment)  None  Hair  Reviewed  Eyes  Reviewed  Mouth  Reviewed [missing multiple teeth]  Skin  Reviewed  Nails  Reviewed       Diet Order:  Diet regular Room service appropriate? Yes with Assist; Fluid consistency: Thin  EDUCATION NEEDS:   Not appropriate for education at this time  Skin:  Skin Assessment: Skin Integrity Issues: Skin Integrity Issues:: Stage II, Stage III Stage II: coccyx Stage III: sacrum  Last BM:  01/10/17  Height:   Ht Readings from Last 1 Encounters:  01/10/17 4\' 11"  (1.499 m)    Weight:   Wt Readings from Last 1 Encounters:  01/11/17 108 lb 14.5 oz (49.4 kg)    Ideal Body Weight:  43.2 kg  BMI:  Body mass index is 22 kg/m.  Estimated Nutritional Needs:   Kcal:  1600-1800 kcal/day  Protein:  70-90 g/day  Fluid:  1000 ml + UOP    Mariana Single RD, LDN Clinical Nutrition Pager # [phone]

## 2017-01-11 NOTE — Progress Notes (Signed)
Hypoglycemic Event  CBG: 62 @ 0014  Treatment: 15 GM carbohydrate snack  Symptoms: None  Follow-up CBG: Time:0035 CBG Result:79  Possible Reasons for Event: Unknown  Comments/MD notified:Will continue to monitor pt    [name]

## 2017-01-11 NOTE — Clinical Social Work Note (Signed)
Clinical Social Work Assessment  Patient Details  Name: [name] MRN: [id number] Date of Birth: 05-17-1950  Date of referral:  01/11/17               Reason for consult:  Discharge Planning, Facility Placement                Permission sought to share information with:  Facility Sport and exercise psychologist, Family Supports Permission granted to share information::  Yes, Verbal Permission Granted  Name::     [name]  Agency::     Relationship::  Daughter  Contact Information:  [phone]  Housing/Transportation Living arrangements for the past 2 months:  [place], Mobile Home(From [facility] for Rehab for approx 2 weeks) Source of Information:  Patient, Adult Children Patient Interpreter Needed:  None Criminal Activity/Legal Involvement Pertinent to Current Situation/Hospitalization:  No - Comment as needed Significant Relationships:  Adult Children, Spouse Lives with:    Do you feel safe going back to the place where you live?  Yes Need for family participation in patient care:  Yes (Comment)  Care giving concerns: Patient from [facility] SNF where she was receiving rehab. Patient's daughter reported that if patient is doing better and can ambulate they want patient to return home. Patient's daughter reported that at baseline patient was ambulating and feeding herself independently and that she needed minimal assistance with bathing and dressing. Patient's daughter reported that prior to rehab she was living at home with her husband and son.    Social Worker assessment / plan:  CSW spoke with patient and patient's daughter at bedside regarding discharge planning. Patient reported that she has been at [facility] for approx 2 weeks and that rehab is going okay and she likes the facility. Patient's daughter reported that patient had some issues with participation. Patient's daughter reported that if patient is doing better and is able to get up and move  around she would like patient to return home, patient agreeable. CSW agreed to follow up with patient/patient's family after PT seen patient and inquire further about discharge plans. Patient reported that she is agreeable to return to SNF to complete rehab if needed.   CSW will follow up with patient to find out patient's discharge plans.   Patient is currently thinking about dc home depending on her condition.   Employment status:  Retired Forensic scientist:  Medicaid [place], [employer] PT Recommendations:  Not assessed at this time Information / Referral to community resources:  (Patient from SNF)  Patient/Family's Response to care:  Patient/patient's daughter appreciative of CSW assistance with discharge planning. Patient unsure about disposition at this time.  Patient/Family's Understanding of and Emotional Response to Diagnosis, Current Treatment, and Prognosis:  Patient presented calm and verbalized uncertainty about discharge plans. Patient's daughter involved in patient's care and reported that she would prefer patient to return home at discharge if possible .   Emotional Assessment Appearance:  Appears stated age Attitude/Demeanor/Rapport:  Other(Cooperative) Affect (typically observed):  Calm Orientation:  Oriented to Self, Oriented to Place, Oriented to  Time, Oriented to Situation Alcohol / Substance use:  Not Applicable Psych involvement (Current and /or in the community):  No (Comment)  Discharge Needs  Concerns to be addressed:  Care Coordination Readmission within the last 30 days:  Yes Current discharge risk:  None Barriers to Discharge:  Continued Medical Work up   [employer], LCSW 01/11/2017, 11:15 AM

## 2017-01-12 DIAGNOSIS — T782XXA Anaphylactic shock, unspecified, initial encounter: Secondary | ICD-10-CM

## 2017-01-12 DIAGNOSIS — Q2730 Arteriovenous malformation, site unspecified: Secondary | ICD-10-CM

## 2017-01-12 DIAGNOSIS — N184 Chronic kidney disease, stage 4 (severe): Secondary | ICD-10-CM

## 2017-01-12 DIAGNOSIS — R1084 Generalized abdominal pain: Secondary | ICD-10-CM

## 2017-01-12 DIAGNOSIS — K921 Melena: Secondary | ICD-10-CM

## 2017-01-12 LAB — GASTROINTESTINAL PANEL BY PCR, STOOL (REPLACES STOOL CULTURE)
Adenovirus F40/41: NOT DETECTED
Astrovirus: NOT DETECTED
Campylobacter species: NOT DETECTED
Cryptosporidium: NOT DETECTED
Cyclospora cayetanensis: NOT DETECTED
ENTAMOEBA HISTOLYTICA: NOT DETECTED
ENTEROPATHOGENIC E COLI (EPEC): NOT DETECTED
ENTEROTOXIGENIC E COLI (ETEC): NOT DETECTED
Enteroaggregative E coli (EAEC): NOT DETECTED
Giardia lamblia: NOT DETECTED
NOROVIRUS GI/GII: NOT DETECTED
Plesimonas shigelloides: NOT DETECTED
Rotavirus A: NOT DETECTED
SALMONELLA SPECIES: NOT DETECTED
SAPOVIRUS (I, II, IV, AND V): NOT DETECTED
SHIGA LIKE TOXIN PRODUCING E COLI (STEC): NOT DETECTED
SHIGELLA/ENTEROINVASIVE E COLI (EIEC): NOT DETECTED
VIBRIO CHOLERAE: NOT DETECTED
Vibrio species: NOT DETECTED
Yersinia enterocolitica: NOT DETECTED

## 2017-01-12 LAB — CBC
HCT: 21.8 % — ABNORMAL LOW (ref 36.0–46.0)
Hemoglobin: 7.4 g/dL — ABNORMAL LOW (ref 12.0–15.0)
MCH: 29.5 pg (ref 26.0–34.0)
MCHC: 33.9 g/dL (ref 30.0–36.0)
MCV: 86.9 fL (ref 78.0–100.0)
PLATELETS: 198 10*3/uL (ref 150–400)
RBC: 2.51 MIL/uL — ABNORMAL LOW (ref 3.87–5.11)
RDW: 16 % — AB (ref 11.5–15.5)
WBC: 11.7 10*3/uL — ABNORMAL HIGH (ref 4.0–10.5)

## 2017-01-12 LAB — RENAL FUNCTION PANEL
Albumin: 1.6 g/dL — ABNORMAL LOW (ref 3.5–5.0)
Anion gap: 9 (ref 5–15)
BUN: 59 mg/dL — AB (ref 6–20)
CHLORIDE: 99 mmol/L — AB (ref 101–111)
CO2: 29 mmol/L (ref 22–32)
CREATININE: 3.02 mg/dL — AB (ref 0.44–1.00)
Calcium: 6.8 mg/dL — ABNORMAL LOW (ref 8.9–10.3)
GFR calc Af Amer: 17 mL/min — ABNORMAL LOW (ref >60)
GFR, EST NON AFRICAN AMERICAN: 15 mL/min — AB (ref >60)
Glucose, Bld: 80 mg/dL (ref 65–99)
Phosphorus: 3.9 mg/dL (ref 2.5–4.6)
Potassium: 3.6 mmol/L (ref 3.5–5.1)
Sodium: 137 mmol/L (ref 135–145)

## 2017-01-12 LAB — MAGNESIUM: Magnesium: 1.4 mg/dL — ABNORMAL LOW (ref 1.7–2.4)

## 2017-01-12 MED ORDER — MAGNESIUM SULFATE 4 GM/100ML IV SOLN
4.0000 g | Freq: Once | INTRAVENOUS | Status: AC
  Administered 2017-01-12: 4 g via INTRAVENOUS
  Filled 2017-01-12: qty 100

## 2017-01-12 MED ORDER — PEG-KCL-NACL-NASULF-NA ASC-C 100 G PO SOLR
0.5000 | Freq: Once | ORAL | Status: AC
  Administered 2017-01-12: 100 g via ORAL
  Filled 2017-01-12: qty 1

## 2017-01-12 MED ORDER — SODIUM CHLORIDE 0.9 % IV SOLN
510.0000 mg | Freq: Once | INTRAVENOUS | Status: AC
  Administered 2017-01-12: 510 mg via INTRAVENOUS
  Filled 2017-01-12: qty 17

## 2017-01-12 MED ORDER — EPINEPHRINE PF 1 MG/10ML IJ SOSY
PREFILLED_SYRINGE | INTRAMUSCULAR | Status: AC
  Filled 2017-01-12: qty 10

## 2017-01-12 MED ORDER — EPINEPHRINE PF 1 MG/ML IJ SOLN
0.5000 mg | Freq: Once | INTRAMUSCULAR | Status: AC
  Administered 2017-01-12: 0.5 mg via INTRAMUSCULAR
  Filled 2017-01-12: qty 1

## 2017-01-12 MED ORDER — POTASSIUM CHLORIDE CRYS ER 20 MEQ PO TBCR
40.0000 meq | EXTENDED_RELEASE_TABLET | Freq: Once | ORAL | Status: AC
  Administered 2017-01-12: 40 meq via ORAL
  Filled 2017-01-12: qty 2

## 2017-01-12 MED ORDER — DIPHENHYDRAMINE HCL 50 MG/ML IJ SOLN
25.0000 mg | Freq: Four times a day (QID) | INTRAMUSCULAR | Status: DC
  Administered 2017-01-12 – 2017-01-13 (×3): 25 mg via INTRAVENOUS
  Filled 2017-01-12 (×3): qty 1

## 2017-01-12 MED ORDER — SODIUM CHLORIDE 0.9 % IV BOLUS (SEPSIS)
1000.0000 mL | Freq: Once | INTRAVENOUS | Status: AC
  Administered 2017-01-12: 1000 mL via INTRAVENOUS

## 2017-01-12 MED ORDER — DIPHENHYDRAMINE HCL 25 MG PO CAPS
25.0000 mg | ORAL_CAPSULE | ORAL | Status: DC | PRN
Start: 2017-01-12 — End: 2017-01-13
  Filled 2017-01-12: qty 1

## 2017-01-12 MED ORDER — EPINEPHRINE PF 1 MG/ML IJ SOLN
0.5000 mg | Freq: Once | INTRAMUSCULAR | Status: AC
  Administered 2017-01-12: 0.5 mg via INTRAMUSCULAR
  Filled 2017-01-12: qty 0.5

## 2017-01-12 MED ORDER — SODIUM CHLORIDE 0.9 % IV SOLN
INTRAVENOUS | Status: DC
Start: 2017-01-12 — End: 2017-01-14
  Administered 2017-01-12 – 2017-01-13 (×3): via INTRAVENOUS

## 2017-01-12 MED ORDER — METHYLPREDNISOLONE SODIUM SUCC 125 MG IJ SOLR
60.0000 mg | Freq: Four times a day (QID) | INTRAMUSCULAR | Status: DC
  Administered 2017-01-12 – 2017-01-14 (×8): 60 mg via INTRAVENOUS
  Filled 2017-01-12 (×8): qty 2

## 2017-01-12 NOTE — Consult Note (Signed)
Consultation  Referring Provider:  Dr. Sheran Fava    Primary Care Physician:  Arnoldo Morale, MD Primary Gastroenterologist: Dr. Henrene Pastor       Reason for Consultation: Anemia, abdominal pain            HPI:   [name] is a 66 y.o. female with a past medical history of COPD, CHF, CKD, CAD and PVD on Plavix and aspirin with a history of GI bleeding thought possibly related to gastric and small bowel angiodysplastic lesions, who presented to the ED on 01/11/17 with a complaint of abdominal pain and nausea.  Patient was diagnosed with a UTI and CT scan showed possible changes of chronic mesenteric ischemia.  We are consulted today in regards to a drop in patient's hemoglobin over the past 48 hours.    Today, it should be noted that patient and family are VERY poor historians. The patient tells me that she has been having "black, sticky" stools for the past 2 days, at least twice a day. She does recall this happening in the past and corresponding procedures to these events. She is on daily oral iron, ever since first sign of melena years ago. Overall the patient just feels "tired and icky". She describes lower/ suprapubic abdominal pain, unchanged since admission. She also admits to periodic nausea and vomiting but tells me this has not hindered her appetite.    Per review of admission H&P, patient was admitted 11/6-11/8 due to a left groin abscess and intertrigo. Her abscess was drained and she was discharged to SNF.  Her abscess is healing, however she has not been thriving. Family described she would lay on the couch and have watery, sometimes blood tinged diarrhea for the last two months. Apparently she also described intermittent periumbilical pain worse after eating.  Hospital course: Diagnosed with UTI, CT scan of abdomen and pelvis did not show obstruction secondary to right kidney stone and there was no other acute changes to explain worsening abdominal pain, she did however, as expected have  evidence of severe diffuse atherosclerosis with probable stenosis of the mesenteric and celiac arteries which could be consistent with chronic mesenteric ischemia.  Improved in the ER with IV fluids and antibiotics.  Previous GI history: 06/22/16-EGD/push enteroscopy, Dr. Hilarie Fredrickson: LA grade C reflux, acute and erosive esophagitis, 2 cm hiatal hernia, nonbleeding erosive gastropathy, single nonbleeding angiectasia in the duodenum treated with APC, normal jejunum 03/25/15-enteroscopy, Dr. Loletha Carrow: Mucosa of esophagus normal, duodenal mucosa normal, no abnormalities in the jejunum, single, diminutive, nonbleeding origin of the proximal gastric body along the greater curvature with no intervention necessary 10/04/06-colonoscopy, Dr. Henrene Pastor:  Tiny colon polyps and diverticulosis in the left colon  Past Medical History:  Diagnosis Date  . Abscess, groin 12/2016  . Anemia, chronic disease 05/16/2008   Qualifier: Diagnosis of  By: Percival Spanish, MD, Farrel Gordon    . Bradycardia    a. Limiting BB titration.  [name] CAD (coronary artery disease)    a. NSTEMI 2007 in setting of anemia; LHC 50% LM, 60% LAD, 30% RI, occluded RCA with L-R collaterals, normal EF, treated medically; B. LHC 01/2015 prox-mid RCA 100%, ostial to prox LAD 70%, LM 50%, mid-distal circ 90%-not surgical candidate due to renal and pulmonary issues).  . Carotid artery disease ([place])    a. Duplex 12/2014: left CEA patent with elevated velocities, 60-79% RICA (stable over serial exams), 57-84% LICA stenosis (stable over serial exams), 50% LECA, elevated velocities in bilateral subclavian arteries.  [name]  Chronic combined systolic (congestive) and diastolic (congestive) heart failure ([place])    a. 06/2016 Echo: EF 40-45%, mod MR, mildly dil LA/RA, mod TR, PASP 49mmHg.  . CKD (chronic kidney disease), stage IV ([place])   . COPD (chronic obstructive pulmonary disease) ([facility])    home oxygen  . Dementia   . Depression   . Diabetes mellitus   . Diabetic retinopathy ([place])    . Essential hypertension   . GI AVM (gastrointestinal arteriovenous vascular malformation)    small bowel  . Hyperlipidemia   . Hypertensive heart disease with congestive heart failure ([place])   . Iron deficiency anemia    a. ? chronic GI blood loss-->plavix d/c'd 06/2016.  [name] Peripheral vascular disease ([place])    a.s /p left common iliac and external iliac artery stents. b. H/o subclavian, mesenteric, and celiac artery stenosis (see below).  . Protein-calorie malnutrition ([place]) 01/24/2015  . Pulmonary hypertension ([place])    a. 06/2016 Echo: PASP 60mmHg.  [name] Renal artery stenosis ([place])    a. Duplex 2014: no obvious evidence of hemodynamically significant stenosis >60%, bilateral intrarenal arteries exhibit absent diastolic flow. There is evidence of elevated velocities of the mid aorta. 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  . Tobacco abuse   . 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 APPENDECTOMY    . CARDIAC CATHETERIZATION N/A 01/23/2015   Procedure: Left Heart Cath and Coronary Angiography;  Surgeon: [name], MD;  Location: [facility];  Service: Cardiovascular;  Laterality: N/A;  . CAROTID ENDARTERECTOMY    . CESAREAN SECTION    . CHOLECYSTECTOMY    . COLONOSCOPY WITH ESOPHAGOGASTRODUODENOSCOPY (EGD)  06/22/2016  . ENTEROSCOPY  01/26/2012   Procedure: ENTEROSCOPY;  Surgeon: Gatha Mayer, MD;  Location: [facility];  Service: Endoscopy;  Laterality: N/A;  . ENTEROSCOPY N/A 03/25/2015   Procedure: ENTEROSCOPY;  Surgeon: Doran Stabler, MD;  Location: [facility] ENDOSCOPY;  Service: Endoscopy;  Laterality: N/A;  . ENTEROSCOPY N/A 06/22/2016   Procedure: ENTEROSCOPY;  Surgeon: Jerene Bears, MD;  Location: [facility] ENDOSCOPY;  Service: Endoscopy;  Laterality: N/A;  . ESOPHAGOGASTRODUODENOSCOPY (EGD) WITH PROPOFOL N/A 06/22/2016   Procedure: ESOPHAGOGASTRODUODENOSCOPY (EGD)  WITH PROPOFOL;  Surgeon: Jerene Bears, MD;  Location: [facility] ENDOSCOPY;  Service: Endoscopy;  Laterality: N/A;  . ILIAC ARTERY STENT     left  . SHOULDER ARTHROSCOPY    . TONSILLECTOMY      Family History  Problem Relation Age of Onset  . Hypertension Mother   . Heart attack Mother   . Hypertension Father   . Angina Father   . Cancer Sister   . Cancer Brother   . Stroke Neg Hx      Social History   Tobacco Use  . Smoking status: Current Every Day Smoker    Packs/day: 1.00    Years: 36.00    Pack years: 36.00    Types: Cigarettes  . Smokeless tobacco: Never Used  Substance Use Topics  . Alcohol use: No    Alcohol/week: 0.0 oz    Frequency: Never  . Drug use: No    Prior to Admission medications   Medication Sig Start Date End Date Taking? Authorizing Provider  acetaminophen (TYLENOL) 325 MG tablet Take 325 mg by mouth every 6 (six) hours as needed.   Yes [provider]  albuterol (PROVENTIL HFA;VENTOLIN HFA) 108 (90 Base)  MCG/ACT inhaler Inhale 2 puffs into the lungs every 6 (six) hours as needed for wheezing or shortness of breath. 06/15/16  Yes Arnoldo Morale, MD  aspirin EC 81 MG tablet Take 81 mg by mouth daily.   Yes [provider]  atorvastatin (LIPITOR) 80 MG tablet TAKE 1 TABLET BY MOUTH  DAILY AT 6 PM. 08/12/16  Yes Amao, Enobong, MD  carvedilol (COREG) 6.25 MG tablet TAKE 1 TABLET BY MOUTH TWO  TIMES DAILY WITH A MEAL 09/13/16  Yes Amao, Enobong, MD  donepezil (ARICEPT) 10 MG tablet Take 1 tablet (10 mg total) by mouth at bedtime. 06/15/16  Yes Arnoldo Morale, MD  ferrous sulfate 325 (65 FE) MG tablet Take 1 tablet (325 mg total) by mouth 2 (two) times daily with a meal. 06/15/16  Yes Amao, Enobong, MD  fish oil-omega-3 fatty acids 1000 MG capsule Take 1 g by mouth 3 (three) times daily. Reported on 04/09/2015   Yes [provider]  furosemide (LASIX) 40 MG tablet Take 40 mg by mouth 2 (two) times daily.   Yes [provider]    hydrALAZINE (APRESOLINE) 50 MG tablet TAKE 1 TABLET BY MOUTH 3  TIMES DAILY 12/20/16  Yes Jegede, Olugbemiga E, MD  nitroGLYCERIN (NITROSTAT) 0.4 MG SL tablet Place 0.4 mg under the tongue every 5 (five) minutes as needed for chest pain. X 3 doses   Yes [provider]  pantoprazole (PROTONIX) 40 MG tablet TAKE 1 TABLET BY MOUTH  DAILY 11/22/16  Yes Amao, Charlane Ferretti, MD  potassium chloride (K-DUR) 10 MEQ tablet Take 10 mEq daily by mouth.   Yes [provider]  promethazine (PHENERGAN) 25 MG tablet Take 25 mg by mouth every 6 (six) hours as needed for nausea or vomiting. x3 doses   Yes [provider]  saccharomyces boulardii (FLORASTOR) 250 MG capsule Take 250 mg 2 (two) times daily by mouth.   Yes [provider]  sertraline (ZOLOFT) 50 MG tablet Take 1 tablet (50 mg total) by mouth daily. 08/12/16  Yes Arnoldo Morale, MD  traMADol (ULTRAM) 50 MG tablet Take 50 mg by mouth 2 (two) times daily.   Yes [provider]  NUTRITIONAL SUPPLEMENT LIQD Take 120 mLs 2 (two) times daily by mouth. [employer]    [provider]    Current Facility-Administered Medications  Medication Dose Route Frequency Provider Last Rate Last Dose  . 0.9 %  sodium chloride infusion   Intravenous Continuous Short, Mackenzie, MD 100 mL/hr at 01/12/17 2841    . acetaminophen (TYLENOL) tablet 650 mg  650 mg Oral Q6H PRN Short, Mackenzie, MD      . albuterol (PROVENTIL) (2.5 MG/3ML) 0.083% nebulizer solution 2.5 mg  2.5 mg Inhalation Q6H PRN Short, Mackenzie, MD      . aspirin EC tablet 81 mg  81 mg Oral Daily Janece Canterbury, MD   81 mg at 01/11/17 1044  . atorvastatin (LIPITOR) tablet 80 mg  80 mg Oral q1800 Janece Canterbury, MD   80 mg at 01/11/17 1741  . calcium carbonate (TUMS - dosed in mg elemental calcium) chewable tablet 200 mg of elemental calcium  1 tablet Oral TID WC Short, Mackenzie, MD   200 mg of elemental calcium at 01/12/17 0826  . cefTRIAXone (ROCEPHIN) 1 g in  dextrose 5 % 50 mL IVPB  1 g Intravenous Q24H Berton Mount, RPH   Stopped at 01/11/17 1201  . donepezil (ARICEPT) tablet 10 mg  10 mg Oral QHS Short, Mackenzie,  MD   10 mg at 01/11/17 2016  . feeding supplement (GLUCERNA SHAKE) (GLUCERNA SHAKE) liquid 237 mL  237 mL Oral TID BM Janece Canterbury, MD   237 mL at 01/11/17 1358  . ferrous sulfate tablet 325 mg  325 mg Oral BID WC Janece Canterbury, MD   325 mg at 01/12/17 0826  . folic acid (FOLVITE) tablet 1 mg  1 mg Oral Daily Short, Mackenzie, MD      . heparin injection 5,000 Units  5,000 Units Subcutaneous Christiana Pellant, MD   5,000 Units at 01/12/17 [phone]  . magnesium sulfate IVPB 4 g 100 mL  4 g Intravenous Once Janece Canterbury, MD 50 mL/hr at 01/12/17 0824 4 g at 01/12/17 0824  . omega-3 acid ethyl esters (LOVAZA) capsule 1 g  1 g Oral BID Janece Canterbury, MD   1 g at 01/11/17 2016  . ondansetron (ZOFRAN) tablet 4 mg  4 mg Oral Q6H PRN Short, Mackenzie, MD       Or  . ondansetron (ZOFRAN) injection 4 mg  4 mg Intravenous Q6H PRN Short, Mackenzie, MD      . pantoprazole (PROTONIX) EC tablet 40 mg  40 mg Oral Daily Short, Mackenzie, MD   40 mg at 01/11/17 1045  . potassium chloride SA (K-DUR,KLOR-CON) CR tablet 40 mEq  40 mEq Oral Once Janece Canterbury, MD      . promethazine (PHENERGAN) tablet 25 mg  25 mg Oral Q6H PRN Short, Mackenzie, MD      . saccharomyces boulardii (FLORASTOR) capsule 250 mg  250 mg Oral BID Janece Canterbury, MD   250 mg at 01/11/17 2016  . sertraline (ZOLOFT) tablet 50 mg  50 mg Oral Daily Short, Mackenzie, MD   50 mg at 01/11/17 1044  . sodium chloride flush (NS) 0.9 % injection 3 mL  3 mL Intravenous Q12H Short, Mackenzie, MD      . traMADol Veatrice Bourbon) tablet 50 mg  50 mg Oral BID Janece Canterbury, MD   50 mg at 01/11/17 2014    Allergies as of 01/10/2017  . (No Known Allergies)     Review of Systems:    Constitutional: No fever or chills Skin: No rash Cardiovascular: No chest pain Respiratory: No SOB   Gastrointestinal: See HPI and otherwise negative Genitourinary: No dysuria Neurological: No headache Musculoskeletal: No new muscle or joint pain Hematologic: No bruising Psychiatric: No history of depression or anxiety   Physical Exam:  Vital signs in last 24 hours: Temp:  [98.2 F (36.8 C)-98.8 F (37.1 C)] 98.2 F (36.8 C) (11/22 0641) Pulse Rate:  [57-59] 58 (11/22 0641) Resp:  [16-18] 16 (11/22 0641) BP: (93-135)/(55-84) 105/84 (11/22 0641) SpO2:  [98 %-99 %] 98 % (11/22 0641) Weight:  [107 lb 2.3 oz (48.6 kg)] 107 lb 2.3 oz (48.6 kg) (11/22 0500) Last BM Date: 01/12/17 General:   Pleasant chronically ill appearing elderly Caucasian female, appears to be in NAD, Well developed, Well nourished, alert and cooperative Head:  Normocephalic and atraumatic. Eyes:   PEERL, EOMI. No icterus. Conjunctiva pink. Ears:  Normal auditory acuity. Neck:  Supple Throat: Oral cavity and pharynx without inflammation, swelling or lesion.  Lungs: Respirations even and unlabored. Lungs clear to auscultation bilaterally.   No wheezes, crackles, or rhonchi.  Heart: Normal S1, S2. + murmur Regular rate and rhythm. No peripheral edema, cyanosis or pallor.  Abdomen:  Soft, nondistended, mild suprapubic ttp. No rebound or guarding. Normal bowel sounds. No appreciable masses or hepatomegaly. Rectal:  Not performed.  Msk:  Symmetrical without gross deformities.  Extremities:  Without edema, no deformity or joint abnormality.  Neurologic:  Alert and  oriented x4;  grossly normal neurologically.   Skin:   Healing L groin abscess Psychiatric:  Demonstrates good judgement and reason without abnormal affect or behaviors.Memory impairment   LAB RESULTS: Recent Labs    01/10/17 1430 01/11/17 0519 01/12/17 0529  WBC 17.8* 9.5 11.7*  HGB 9.7* 7.7* 7.4*  HCT 29.2* 22.1* 21.8*  PLT 270 215 198   BMET Recent Labs    01/10/17 1430 01/11/17 0519 01/12/17 0529  NA 135 137 137  K 4.0 3.2* 3.6  CL  115* 110 99*  CO2 10* 16* 29  GLUCOSE 125* 69 80  BUN 70* 64* 59*  CREATININE 3.71* 3.26* 3.02*  CALCIUM 8.1* 7.5* 6.8*   LFT Recent Labs    01/10/17 1430  01/12/17 0529  PROT 5.3*  --   --   ALBUMIN 2.1*   < > 1.6*  AST 20  --   --   ALT 16  --   --   ALKPHOS 93  --   --   BILITOT 0.6  --   --    < > = values in this interval not displayed.    STUDIES: Ct Abdomen Pelvis Wo Contrast  Result Date: 01/10/2017 CLINICAL DATA:  Worsening kidney function, increased white cell count, and epigastric abdominal pain. EXAM: CT ABDOMEN AND PELVIS WITHOUT CONTRAST TECHNIQUE: Multidetector CT imaging of the abdomen and pelvis was performed following the standard protocol without IV contrast. COMPARISON:  12/20/2016 FINDINGS: Lower chest: 7 x 12 mm nodule in the right lung base anteriorly without change since previous study. The nodule was present without change on previous CT chest from 05/12/2015. Continued follow-up until 2 year stability can be demonstrated is warranted. Slight fibrosis in the lung bases. Calcified granuloma in the right lung base. Hepatobiliary: Surgical absence of the gallbladder. No bile duct dilatation. Vascular calcifications in the hepatic hilum. Pancreas: Unremarkable. No pancreatic ductal dilatation or surrounding inflammatory changes. Spleen: Normal in size without focal abnormality. Adrenals/Urinary Tract: No adrenal gland nodules. Prominent vascular calcifications in the renal artery's hand renal hilar vessels. No hydronephrosis or hydroureter. Subcentimeter lesions on both kidneys likely representing cysts. Hyperattenuating lesion on the lower pole left kidney is probably a hemorrhagic cyst. No change since previous study. Focal scarring in the midpole right kidney. Bladder is mostly decompressed. Mild diffuse bladder wall thickening may indicate cystitis. Correlation with urinalysis is recommended. No filling defects demonstrated. Stomach/Bowel: The stomach, small bowel,  and colon are not abnormally distended. No wall thickening or inflammatory changes are appreciated. Under distention limits evaluation. Contrast material and stool throughout the colon. No inflammatory changes. Appendix is not identified. Vascular/Lymphatic: Extensive vascular calcifications throughout the abdominal aorta and major branch vessels. There is evidence of calcific stenosis of the lower aorta and extending into both iliac arteries. Likely there areas of high-grade stenosis in the iliac and external iliac arteries bilaterally. Prominent calcification in the mesenteric arteries and celiac axis artery's likely also results in significant stenosis. Reproductive: Uterus and ovaries are not enlarged. Other: No free air or free fluid in the abdomen. Abdominal wall musculature appears intact. Musculoskeletal: Degenerative changes in the spine. No destructive bone lesions. IMPRESSION: 1. Extensive aortic atherosclerosis and extensive calcific atherosclerosis throughout the abdominal vasculature. Likely there are areas of significant stenosis in multiple vessels. 2. No evidence of bowel obstruction or inflammation. 3. Subcentimeter lesions  in both kidneys likely represent cysts although too small to characterize. Hemorrhagic cyst on the left kidney. 4. Unchanged appearance of a 7 x 12 mm nodule in the right lung base. Six-month follow-up study recommended to demonstrate continued stability. 5. Bladder wall thickening may indicate cystitis. Correlation with urinalysis recommended. Electronically Signed   By: Lucienne Capers M.D.   On: 01/10/2017 21:22     PREVIOUS ENDOSCOPIES:            See HPI   Impression / Plan:   Impression: 1. Abdominal Pain: Consider relation to mesenteric ischemia +/- cystitis 2. H/o angioectasia of small bowel 3. Melena: for the past 2 days, drop in hgb from 9's to 7's 4. Anemia: likely related to above, some degree of chronic blood loss from angioectasias  Plan: 1. Will  order Small bowel pill capsule endoscopy for tomorrow morning to try to pinpoint possible bleeding 2. Continue supportive measures  3. Continue to monitor hgb with transfusion as needed <7 4. Starting IV iron 5. Please await any final recs from Dr. Silverio Decamp later today  Thank you for your kind consultation, we will continue to follow.  Lavone Nian [facility]  01/12/2017, 9:52 AM Pager #: [phone]   Attending physician's note   I have taken a history, examined the patient and reviewed the chart. I agree with the Advanced Practitioner's note, impression and recommendations. 66 year old female with history of CKD, CAD, peripheral vascular disease, extensive atherosclerosis admitted with abdominal pain and worsening anemia.  Known AVM of small bowel, status post enteroscopyX2 within the past 2 years Suspicion for recurrent GI bleed from AVM Will start IV iron Small bowel capsule endoscopy to determine the location of AVM and see if has ongoing active bleed, endoscopic intervention based on findings of small bowel capsule.  Damaris Hippo, MD [phone] Mon-Fri 8a-5p [phone] after 5p, weekends, holidays

## 2017-01-12 NOTE — Progress Notes (Addendum)
PROGRESS NOTE  [name]  GDJ:[id number] DOB: 1950/03/10 DOA: 01/10/2017 PCP: Arnoldo Morale, MD  Brief Narrative:   The patient is a 66 year old female with history of diabetes, hypertension, hyperlipidemia, stroke, peripheral vascular disease, coronary artery disease, chronic systolic heart failure with an ejection fraction of 25 just 30%, dementia, iron deficiency anemia, who presented with abdominal pain, nausea, dehydration, dehydration, with acute kidney injury.  She was found to have a urinary tract infection.  She had a CT scan of the abdomen and pelvis which did not demonstrate any obstruction secondary to her right kidney stone and there is no other acute change to explain worsening abdominal pain.  She did, however, is expected to have evidence of severe diffuse atherosclerosis with probable stenosis of the mesenteric and celiac arteries which would be consistent with chronic mesenteric ischemia which I think is her primary underlying problem.  She is feeling better with IV fluids and antibiotics.  Her hemoglobin trended down over the last 24 hours.  GI consulted.  Patient had anaphylaxis to feraheme.  Required two doses of epi and was given solumedrol, benadryl, and NS bolus.  Will transfer to stepdown overnight to monitor for rebound.    Assessment & Plan:   Principal Problem:   Acute kidney injury superimposed on chronic kidney disease (HCC) Active Problems:   Chronic obstructive pulmonary disease (HCC)   Renal artery stenosis (HCC)   Diet-controlled diabetes mellitus (HCC)   Iron deficiency anemia   Stage 4 chronic kidney disease (HCC)   CAD (coronary artery disease)   Chronic diastolic CHF (congestive heart failure) (HCC)   Protein-calorie malnutrition (HCC)   AKI (acute kidney injury) ([place])   Dementia   Anemia   Abscess of groin  Anaphylaxis and hives secondary to Feraheme - Administered epinephrine x2 IM -Start Benadryl 25 mg IV every 6 hours -Normal saline  boluses -Solu-Medrol 60 mg IV every 6 hours -Resume telemetry and continuous pulse oximetry -Transfer to stepdown if blood pressures not improved for possible epinephrine infusion  Acute on chronic kidney disease, stage 4, with nongap metabolic acidosis, on baseline creatinine is probably around 2.6g/dl and was initially 3.71 g/dl.  Likely has some ATN from dehydration and some CKD related to vascular disease (including renovascular disease), hypertension, and diabetes.   -Metabolic acidosis has corrected -  Creatinine trending down -  Discontinue sodium bicarbonate fluids and change to normal saline -  Hold lasix -  Repeat BMP in a.m.   Nausea, vomiting, abdominal pain and diarrhea.  LFTs stable.  Lipase only minimally elevated.    Likely secondary to chronic mesenteric ischemia with a concomitant UTI -  Continue ceftriaxone -Urine culture growing Klebsiella, sensitivities pending -  Zofran prn with phenergan for breakthrough -  C. Diff negative and GI pathogen PCR pending  Moderate malnutrition, still not eating much -Appreciate nutrition assistance -Continue regular diet with supplements  Previous multiple GI bleeds with erosive esophagitis, now with streaks of blood in emesis and stools, heme globin trending down, acute on chronic anemia.   -  Occult stool positive -  Continue PPI -  Folic acid low.  Iron level and vitamin B12 wnl. -GI consultation, appreciate assistance - planning for capsule endoscopy  Hematuria due to right renal mass and 67m stone -  F/u with Urology. Dr. EJanina Mayoat already scheduled appointment in Dec -  Right renal abnormality appears to be resolved on CT from yesterday.  She has some subcentimeter lesions on both kidneys likely representing cysts.  There was a hyper attenuating lesion on the lower pole of the left kidney which is thought to be a hemorrhagic cyst and there is scarring in the midpole of the right kidney.  Diabetes mellitus type 2, A1c  5.6 in July, hyperglycemia has resolved -  DC low dose SSI   Dementia, mild to moderate, needs reorientation, husband gives meds to her, daughter organizes -  Continue aricept  Chronic diastolic and systolic heart failure, EF 40-45% most recently, blood pressure still mildly hypotensive -  Hold blood pressure medications due to hypotension -  Hold lasix due to dehydration  CAD, not candidate for PCI multivessel disease -  Holding Coreg and hydralazine due to hypotension -  Continue atorvastatin -  Continue aspirin  Peripheral vascular disease status post left common iliac and external iliac artery stents as well as prior CVA on Plavix - continue aspirin  COPD, stable, continue albuterol  Left groin abscess.  Continue once daily silver dressing with dry dressing changes  Generalized weakness -  PT/OT recommending skilled nursing  Iron deficiency anemia, on supplementation but worsening anemia -  Iron studies, B12, folate:  Folate deficiency, start folic acid -  TSH 6.294 -  Occult stool positive -  Repeat hgb in AM -  Received Feraheme on 11/22 which resulted in hives and anaphylaxis  Leukocytosis, likely secondary to dehydration and UTI, resolved.  Hypokalemia, oral potassium repletion  Hyperphosphatemia, likely due to CKD.  Start TUMS TID  DVT prophylaxis:  heparin Code Status: DNR Family Communication: Patient alone Disposition Plan: Possibly ready for discharge tomorrow if her hemoglobin is stable and her creatinine continues to trend down.  Anticipate she will go back to skilled nursing facility for rehabilitation.   Consultants:   None  Procedures:  None  Antimicrobials:  Anti-infectives (From admission, onward)   Start     Dose/Rate Route Frequency Ordered Stop   01/11/17 1000  cefTRIAXone (ROCEPHIN) 1 g in dextrose 5 % 50 mL IVPB     1 g 100 mL/hr over 30 Minutes Intravenous Every 24 hours 01/11/17 0752         Subjective:  During initial  interview, the patient stated that she felt very well.  She has been nibbling at her food and she has been tolerating some Glucerna.  Her abdominal pains have improved as has her nausea.  She continues to have watery stools but they are less frequent.  Was called back to examine her because of a new rash that had developed on her abdomen that started when her ferriheme was infusing.  By the time of my visit, the rash/hives had extended to bilateral arms, bilateral legs, covered her trunk and her face and she was itching.  We checked her blood pressure and she was hypotensive to the 80 systolic with diastolics in the 76L.  She denied any difficulty breathing.  Other than becoming sleepy with the Benadryl, she did not have any changes to her mentation.  Objective: Vitals:   01/12/17 1330 01/12/17 1337 01/12/17 1342 01/12/17 1348  BP: (!) 112/37 (!) 104/49 (!) 96/37 (!) 106/31  Pulse: (!) 52 (!) 54 (!) 52 (!) 53  Resp: 18 16 16 16   Temp:      TempSrc:      SpO2: 95% 98% 100% 99%  Weight:      Height:        Intake/Output Summary (Last 24 hours) at 01/12/2017 1351 Last data filed at 01/12/2017 1333 Gross per 24 hour  Intake 2734.33 ml  Output -  Net 2734.33 ml   Filed Weights   01/11/17 0500 01/11/17 0645 01/12/17 0500  Weight: 49.5 kg (109 lb 2 oz) 49.4 kg (108 lb 14.5 oz) 48.6 kg (107 lb 2.3 oz)    Examination:  General exam:  Adult female, eyes considerably less sunken.  No acute distress.  HEENT:  NCAT, MMM Respiratory system: Clear to auscultation bilaterally Cardiovascular system: Regular rate and rhythm, normal S1/S2. No murmurs, rubs, gallops or clicks.  Warm extremities Gastrointestinal system: Normal active bowel sounds, soft, nondistended, nontender. MSK:  Normal tone and bulk, no lower extremity edema Neuro:  Grossly intact Skin: Hives on face, left eyelid, bilateral arms, bilateral upper legs and covering her trunk  Data Reviewed: I have personally reviewed following  labs and imaging studies  CBC: Recent Labs  Lab 01/10/17 1430 01/11/17 0519 01/12/17 0529  WBC 17.8* 9.5 11.7*  NEUTROABS 16.5*  --   --   HGB 9.7* 7.7* 7.4*  HCT 29.2* 22.1* 21.8*  MCV 87.7 86.3 86.9  PLT 270 215 878   Basic Metabolic Panel: Recent Labs  Lab 01/10/17 1430 01/11/17 0519 01/12/17 0529  NA 135 137 137  K 4.0 3.2* 3.6  CL 115* 110 99*  CO2 10* 16* 29  GLUCOSE 125* 69 80  BUN 70* 64* 59*  CREATININE 3.71* 3.26* 3.02*  CALCIUM 8.1* 7.5* 6.8*  MG  --  1.7 1.4*  PHOS  --  6.6* 3.9   GFR: Estimated Creatinine Clearance: 12.5 mL/min (A) (by C-G formula based on SCr of 3.02 mg/dL (H)). Liver Function Tests: Recent Labs  Lab 01/10/17 1430 01/11/17 0519 01/12/17 0529  AST 20  --   --   ALT 16  --   --   ALKPHOS 93  --   --   BILITOT 0.6  --   --   PROT 5.3*  --   --   ALBUMIN 2.1* 1.7* 1.6*   Recent Labs  Lab 01/10/17 1430  LIPASE 70*   No results for input(s): AMMONIA in the last 168 hours. Coagulation Profile: No results for input(s): INR, PROTIME in the last 168 hours. Cardiac Enzymes: No results for input(s): CKTOTAL, CKMB, CKMBINDEX, TROPONINI in the last 168 hours. BNP (last 3 results) No results for input(s): PROBNP in the last 8760 hours. HbA1C: No results for input(s): HGBA1C in the last 72 hours. CBG: Recent Labs  Lab 01/11/17 0808 01/11/17 0859 01/11/17 1150 01/11/17 1656 01/11/17 2022  GLUCAP 62* 122* 113* 152* 158*   Lipid Profile: No results for input(s): CHOL, HDL, LDLCALC, TRIG, CHOLHDL, LDLDIRECT in the last 72 hours. Thyroid Function Tests: Recent Labs    01/11/17 0519  TSH 1.848   Anemia Panel: Recent Labs    01/11/17 0519  VITAMINB12 569  FOLATE 4.9*  FERRITIN 261  TIBC 136*  IRON 31   Urine analysis:    Component Value Date/Time   COLORURINE YELLOW 01/11/2017 0545   APPEARANCEUR CLOUDY (A) 01/11/2017 0545   LABSPEC 1.010 01/11/2017 0545   PHURINE 5.0 01/11/2017 0545   GLUCOSEU NEGATIVE 01/11/2017  0545   HGBUR NEGATIVE 01/11/2017 0545   BILIRUBINUR NEGATIVE 01/11/2017 0545   BILIRUBINUR negative 08/11/2015 1513   KETONESUR NEGATIVE 01/11/2017 0545   PROTEINUR NEGATIVE 01/11/2017 0545   UROBILINOGEN 0.2 08/11/2015 1513   UROBILINOGEN 0.2 01/08/2015 1215   NITRITE NEGATIVE 01/11/2017 0545   LEUKOCYTESUR LARGE (A) 01/11/2017 0545   Sepsis Labs: @LABRCNTIP (procalcitonin:4,lacticidven:4)  ) Recent Results (from the  past 240 hour(s))  MRSA PCR Screening     Status: None   Collection Time: 01/11/17  5:40 AM  Result Value Ref Range Status   MRSA by PCR NEGATIVE NEGATIVE Final    Comment:        The GeneXpert MRSA Assay (FDA approved for NASAL specimens only), is one component of a comprehensive MRSA colonization surveillance program. It is not intended to diagnose MRSA infection nor to guide or monitor treatment for MRSA infections.   Culture, Urine     Status: Abnormal (Preliminary result)   Collection Time: 01/11/17  5:45 AM  Result Value Ref Range Status   Specimen Description URINE, CLEAN CATCH  Final   Special Requests NONE  Final   Culture (A)  Final    >=100,000 COLONIES/mL KLEBSIELLA PNEUMONIAE SUSCEPTIBILITIES TO FOLLOW Performed at [facility], [address], [place], [place] [address]    Report Status PENDING  Incomplete  C difficile quick scan w PCR reflex     Status: None   Collection Time: 01/11/17 12:16 PM  Result Value Ref Range Status   C Diff antigen NEGATIVE NEGATIVE Final   C Diff toxin NEGATIVE NEGATIVE Final   C Diff interpretation No C. difficile detected.  Final      Radiology Studies: Ct Abdomen Pelvis Wo Contrast  Result Date: 01/10/2017 CLINICAL DATA:  Worsening kidney function, increased white cell count, and epigastric abdominal pain. EXAM: CT ABDOMEN AND PELVIS WITHOUT CONTRAST TECHNIQUE: Multidetector CT imaging of the abdomen and pelvis was performed following the standard protocol without IV contrast. COMPARISON:   12/20/2016 FINDINGS: Lower chest: 7 x 12 mm nodule in the right lung base anteriorly without change since previous study. The nodule was present without change on previous CT chest from 05/12/2015. Continued follow-up until 2 year stability can be demonstrated is warranted. Slight fibrosis in the lung bases. Calcified granuloma in the right lung base. Hepatobiliary: Surgical absence of the gallbladder. No bile duct dilatation. Vascular calcifications in the hepatic hilum. Pancreas: Unremarkable. No pancreatic ductal dilatation or surrounding inflammatory changes. Spleen: Normal in size without focal abnormality. Adrenals/Urinary Tract: No adrenal gland nodules. Prominent vascular calcifications in the renal artery's hand renal hilar vessels. No hydronephrosis or hydroureter. Subcentimeter lesions on both kidneys likely representing cysts. Hyperattenuating lesion on the lower pole left kidney is probably a hemorrhagic cyst. No change since previous study. Focal scarring in the midpole right kidney. Bladder is mostly decompressed. Mild diffuse bladder wall thickening may indicate cystitis. Correlation with urinalysis is recommended. No filling defects demonstrated. Stomach/Bowel: The stomach, small bowel, and colon are not abnormally distended. No wall thickening or inflammatory changes are appreciated. Under distention limits evaluation. Contrast material and stool throughout the colon. No inflammatory changes. Appendix is not identified. Vascular/Lymphatic: Extensive vascular calcifications throughout the abdominal aorta and major branch vessels. There is evidence of calcific stenosis of the lower aorta and extending into both iliac arteries. Likely there areas of high-grade stenosis in the iliac and external iliac arteries bilaterally. Prominent calcification in the mesenteric arteries and celiac axis artery's likely also results in significant stenosis. Reproductive: Uterus and ovaries are not enlarged. Other: No  free air or free fluid in the abdomen. Abdominal wall musculature appears intact. Musculoskeletal: Degenerative changes in the spine. No destructive bone lesions. IMPRESSION: 1. Extensive aortic atherosclerosis and extensive calcific atherosclerosis throughout the abdominal vasculature. Likely there are areas of significant stenosis in multiple vessels. 2. No evidence of bowel obstruction or inflammation. 3. Subcentimeter lesions  in both kidneys likely represent cysts although too small to characterize. Hemorrhagic cyst on the left kidney. 4. Unchanged appearance of a 7 x 12 mm nodule in the right lung base. Six-month follow-up study recommended to demonstrate continued stability. 5. Bladder wall thickening may indicate cystitis. Correlation with urinalysis recommended. Electronically Signed   By: Lucienne Capers M.D.   On: 01/10/2017 21:22     Scheduled Meds: . aspirin EC  81 mg Oral Daily  . atorvastatin  80 mg Oral q1800  . calcium carbonate  1 tablet Oral TID WC  . diphenhydrAMINE  25 mg Intravenous Q6H  . donepezil  10 mg Oral QHS  . EPINEPHrine  0.5 mg Intramuscular Once  . EPINEPHrine      . feeding supplement (GLUCERNA SHAKE)  237 mL Oral TID BM  . folic acid  1 mg Oral Daily  . heparin  5,000 Units Subcutaneous Q8H  . methylPREDNISolone (SOLU-MEDROL) injection  60 mg Intravenous Q6H  . omega-3 acid ethyl esters  1 g Oral BID  . pantoprazole  40 mg Oral Daily  . peg 3350 powder  0.5 kit Oral Once  . saccharomyces boulardii  250 mg Oral BID  . sertraline  50 mg Oral Daily  . sodium chloride flush  3 mL Intravenous Q12H  . traMADol  50 mg Oral BID   Continuous Infusions: . sodium chloride Stopped (01/12/17 1333)  . cefTRIAXone (ROCEPHIN)  IV Stopped (01/12/17 1154)     LOS: 1 day    Time spent: 30 min    Janece Canterbury, MD [facility] Pager [phone]  If 7PM-7AM, please contact night-coverage [email or web address] Password [email or web address] 01/12/2017, 1:51 PM

## 2017-01-12 NOTE — Progress Notes (Signed)
Dr. Sheran Fava to bedside to assess rash.  Orders were received for epinephrine IM, Solumedrol, benadryl, & NS fluid bolus.  See flowsheet for VS checks & MAR for med administration per orders.Stacey Drain

## 2017-01-12 NOTE — Progress Notes (Signed)
Pt transferred to stepdown. Report given to [place], RN. Stacey Drain

## 2017-01-12 NOTE — Progress Notes (Signed)
Pt's skin assessed and red, raised rash noted over trunk area.  Does not appear to be moisture related.  Pt does c/o of mild itching, but stated it is not "uncomfortable".  Dr. Sheran Fava notified and will come to assess pt. Stacey Drain

## 2017-01-13 ENCOUNTER — Encounter (HOSPITAL_COMMUNITY)
Admission: EM | Disposition: A | Discharge status: Skilled Nursing Facility | Payer: Self-pay | Source: Home / Self Care | Attending: Internal Medicine

## 2017-01-13 DIAGNOSIS — D500 Iron deficiency anemia secondary to blood loss (chronic): Secondary | ICD-10-CM

## 2017-01-13 HISTORY — PX: GIVENS CAPSULE STUDY: SHX5432

## 2017-01-13 LAB — RENAL FUNCTION PANEL
ALBUMIN: 1.8 g/dL — AB (ref 3.5–5.0)
ANION GAP: 12 (ref 5–15)
BUN: 55 mg/dL — ABNORMAL HIGH (ref 6–20)
CALCIUM: 7.2 mg/dL — AB (ref 8.9–10.3)
CO2: 25 mmol/L (ref 22–32)
Chloride: 104 mmol/L (ref 101–111)
Creatinine, Ser: 2.97 mg/dL — ABNORMAL HIGH (ref 0.44–1.00)
GFR calc Af Amer: 18 mL/min — ABNORMAL LOW (ref >60)
GFR calc non Af Amer: 15 mL/min — ABNORMAL LOW (ref >60)
GLUCOSE: 122 mg/dL — AB (ref 65–99)
PHOSPHORUS: 5.8 mg/dL — AB (ref 2.5–4.6)
POTASSIUM: 4.3 mmol/L (ref 3.5–5.1)
SODIUM: 141 mmol/L (ref 135–145)

## 2017-01-13 LAB — CBC
HCT: 24.3 % — ABNORMAL LOW (ref 36.0–46.0)
HEMOGLOBIN: 8.2 g/dL — AB (ref 12.0–15.0)
MCH: 30.1 pg (ref 26.0–34.0)
MCHC: 33.7 g/dL (ref 30.0–36.0)
MCV: 89.3 fL (ref 78.0–100.0)
Platelets: 211 10*3/uL (ref 150–400)
RBC: 2.72 MIL/uL — ABNORMAL LOW (ref 3.87–5.11)
RDW: 16.5 % — AB (ref 11.5–15.5)
WBC: 14.5 10*3/uL — AB (ref 4.0–10.5)

## 2017-01-13 LAB — URINE CULTURE: Culture: >=100000 — AB

## 2017-01-13 LAB — MAGNESIUM: MAGNESIUM: 2.9 mg/dL — AB (ref 1.7–2.4)

## 2017-01-13 SURGERY — IMAGING PROCEDURE, GI TRACT, INTRALUMINAL, VIA CAPSULE
Anesthesia: LOCAL

## 2017-01-13 MED ORDER — SODIUM CHLORIDE 0.9 % IV SOLN: INTRAVENOUS | Status: DC

## 2017-01-13 SURGICAL SUPPLY — 1 items: TOWEL COTTON PACK 4EA (MISCELLANEOUS) ×4 IMPLANT

## 2017-01-13 NOTE — Progress Notes (Signed)
Date: January 13, 2017 Carleen Rhue, BSN, RN3, CCM  [phone] Chart and notes review for patient progress and needs. Will follow for case management and discharge needs. Next review date: [id number] 

## 2017-01-13 NOTE — Progress Notes (Signed)
PROGRESS NOTE  [name] YBO:[id number] DOB: [birth year] DOA: 01/10/2017 PCP: Arnoldo Morale, MD   LOS: 2 days   Brief Narrative / Interim history: 66 year old female with history of diabetes, hypertension, hyperlipidemia, stroke, peripheral vascular disease, coronary artery disease, chronic systolic heart failure with an ejection fraction of 25 just 30%, dementia, iron deficiency anemia, who presented with abdominal pain, nausea, dehydration, dehydration, with acute kidney injury.  She was found to have a urinary tract infection.  She had a CT scan of the abdomen and pelvis which did not demonstrate any obstruction secondary to her right kidney stone and there is no other acute change to explain worsening abdominal pain.  She did, however, is expected to have evidence of severe diffuse atherosclerosis with probable stenosis of the mesenteric and celiac arteries which would be consistent with chronic mesenteric ischemia which I think is her primary underlying problem.  She is feeling better with IV fluids and antibiotics.  Her hemoglobin trended down over the last 24 hours.  GI consulted.  Patient had anaphylaxis to feraheme on 11/22 requiring transfer to stepdown  Assessment & Plan: Principal Problem:   Acute kidney injury superimposed on chronic kidney disease ([place]) Active Problems:   Chronic obstructive pulmonary disease ([place])   Renal artery stenosis (HCC)   Diet-controlled diabetes mellitus (HCC)   Iron deficiency anemia   Stage 4 chronic kidney disease (HCC)   CAD (coronary artery disease)   Chronic diastolic CHF (congestive heart failure) (HCC)   Protein-calorie malnutrition (HCC)   AKI (acute kidney injury) ([place])   Dementia   Anemia   Abscess of groin   Anaphylaxis   Anaphylaxis and hives secondary to Feraheme, on 11/22 -Administered epinephrine x2 IM -Started Benadryl 25 mg IV every 6 hours -Solu-Medrol 60 mg IV every 6 hours -Transfer to stepdown, if she is stable today  will transfer back to floor  Acute on chronic kidney disease, stage 4, with nongap metabolic acidosis -baseline creatinine is probably around 2.6g/dl and was initially 3.71 g/dl  -Likely has some ATN from dehydration and some CKD related to vascular disease (including renovascular disease), hypertension, and diabetes.  -Metabolic acidosis has corrected -Creatinine trending down, continue fluids, hold Lasix -repeat BMP  Nausea, vomiting, abdominal pain and diarrhea -LFTs stable. Lipase only minimally elevated.   Likely secondary to chronic mesenteric ischemia with a concomitant UTI -Continue ceftriaxone for now -Urine culture growing Klebsiella resistant/intermediate to ampicillin, nitrofurantoin, Unasyn -C. difficile and GI pathogen panel negative -Her symptoms have now resolved  Moderate malnutrition, still not eating much -Appreciate nutrition assistance -Continue regular diet with supplements  Previous multiple GI bleeds with erosive esophagitis, now with streaks of blood in emesis and stools, heme globin trending down, acute on chronic anemia.   -Occult stool positive -Continue PPI, discontinue heparin s.q. -Folic acid low.  Iron level and vitamin B12 wnl. -GI consultation, appreciate assistance -planning for capsule endoscopy today  Hematuriadue to right renal mass and 15mm stone -F/u with Urology. Dr. Janina Mayo at already scheduled appointment in Dec -Right renal abnormality appears to be resolved on CT from yesterday. -She has some subcentimeter lesions on both kidneys likely representing cysts.  There was a hyper attenuating lesion on the lower pole of the left kidney which is thought to be a hemorrhagic cyst and there is scarring in the midpole of the right kidney.  Diabetes mellitus type 2, A1c 5.6 in July  -hyperglycemia has resolved -DC low dose SSI   Dementia, mild to moderate,needsreorientation, husband gives  meds to her, daughter organizes -Continue  aricept  Chronic diastolicand systolicheart RCVELFY,BO17-51% most recently, blood pressure still mildly hypotensive -Hold blood pressure medications due to hypotension -Hold lasix due to dehydration  CAD,not candidate for PCI multivessel disease -HoldingCoregand hydralazine due to hypotension -Continue atorvastatin -Continue aspirin  Peripheral vascular disease status post left common iliac and external iliac artery stents as well as prior CVA on Plavix -continue aspirin  COPD  -stable, continue albuterol  Leftgroin abscess -Continue once daily silver dressing with dry dressing changes  Generalized weakness -PT/OT recommending skilled nursing  Iron deficiency anemia, on supplementation but worsening anemia -Iron studies normal -Folate deficiency, start folic acid -TSH 0.258 -Occult stool positive -Repeat hgb in AM -Received Feraheme on 11/22 which resulted in hives and anaphylaxis     DVT prophylaxis: SCD, discontinue heparin with concerns for GI bleed Code Status: DNR Family Communication: no family at bedside Disposition Plan: SNF when ready   Consultants:   GI  Procedures:   None   Antimicrobials:  None    Subjective: - no chest pain, shortness of breath, no abdominal pain, nausea or vomiting.   Objective: Vitals:   01/13/17 0900 01/13/17 1000 01/13/17 1100 01/13/17 1200  BP:  (!) 160/53    Pulse: (!) 51 (!) 53 (!) 53   Resp: (!) 8 14 11    Temp:    97.8 F (36.6 C)  TempSrc:    Oral  SpO2: 97% 96% 96%   Weight:      Height:        Intake/Output Summary (Last 24 hours) at 01/13/2017 1245 Last data filed at 01/13/2017 0400 Gross per 24 hour  Intake 2689 ml  Output -  Net 2689 ml   Filed Weights   01/12/17 0500 01/12/17 1818 01/13/17 0500  Weight: 48.6 kg (107 lb 2.3 oz) 51.3 kg (113 lb 1.5 oz) 52.5 kg (115 lb 11.9 oz)    Examination:  Constitutional: NAD Eyes:  lids and conjunctivae normal Respiratory: clear to  auscultation bilaterally, no wheezing, no crackles. Normal respiratory effort.  Cardiovascular: Regular rate and rhythm, no murmurs / rubs / gallops. No LE edema.  Abdomen: no tenderness. Bowel sounds positive.  Skin: no rashes Neurologic: non focal  Psychiatric: Normal judgment and insight. Alert and oriented x 3. Normal mood.    Data Reviewed: I have independently reviewed following labs and imaging studies   CBC: Recent Labs  Lab 01/10/17 1430 01/11/17 0519 01/12/17 0529 01/13/17 0315  WBC 17.8* 9.5 11.7* 14.5*  NEUTROABS 16.5*  --   --   --   HGB 9.7* 7.7* 7.4* 8.2*  HCT 29.2* 22.1* 21.8* 24.3*  MCV 87.7 86.3 86.9 89.3  PLT 270 215 198 527   Basic Metabolic Panel: Recent Labs  Lab 01/10/17 1430 01/11/17 0519 01/12/17 0529 01/13/17 0315  NA 135 137 137 141  K 4.0 3.2* 3.6 4.3  CL 115* 110 99* 104  CO2 10* 16* 29 25  GLUCOSE 125* 69 80 122*  BUN 70* 64* 59* 55*  CREATININE 3.71* 3.26* 3.02* 2.97*  CALCIUM 8.1* 7.5* 6.8* 7.2*  MG  --  1.7 1.4* 2.9*  PHOS  --  6.6* 3.9 5.8*   GFR: Estimated Creatinine Clearance: 13.8 mL/min (A) (by C-G formula based on SCr of 2.97 mg/dL (H)). Liver Function Tests: Recent Labs  Lab 01/10/17 1430 01/11/17 0519 01/12/17 0529 01/13/17 0315  AST 20  --   --   --   ALT 16  --   --   --  ALKPHOS 93  --   --   --   BILITOT 0.6  --   --   --   PROT 5.3*  --   --   --   ALBUMIN 2.1* 1.7* 1.6* 1.8*   Recent Labs  Lab 01/10/17 1430  LIPASE 70*   No results for input(s): AMMONIA in the last 168 hours. Coagulation Profile: No results for input(s): INR, PROTIME in the last 168 hours. Cardiac Enzymes: No results for input(s): CKTOTAL, CKMB, CKMBINDEX, TROPONINI in the last 168 hours. BNP (last 3 results) No results for input(s): PROBNP in the last 8760 hours. HbA1C: No results for input(s): HGBA1C in the last 72 hours. CBG: Recent Labs  Lab 01/11/17 0808 01/11/17 0859 01/11/17 1150 01/11/17 1656 01/11/17 2022  GLUCAP  62* 122* 113* 152* 158*   Lipid Profile: No results for input(s): CHOL, HDL, LDLCALC, TRIG, CHOLHDL, LDLDIRECT in the last 72 hours. Thyroid Function Tests: Recent Labs    01/11/17 0519  TSH 1.848   Anemia Panel: Recent Labs    01/11/17 0519  VITAMINB12 569  FOLATE 4.9*  FERRITIN 261  TIBC 136*  IRON 31   Urine analysis:    Component Value Date/Time   COLORURINE YELLOW 01/11/2017 0545   APPEARANCEUR CLOUDY (A) 01/11/2017 0545   LABSPEC 1.010 01/11/2017 0545   PHURINE 5.0 01/11/2017 0545   GLUCOSEU NEGATIVE 01/11/2017 0545   HGBUR NEGATIVE 01/11/2017 0545   BILIRUBINUR NEGATIVE 01/11/2017 0545   BILIRUBINUR negative 08/11/2015 1513   [place] 01/11/2017 0545   PROTEINUR NEGATIVE 01/11/2017 0545   UROBILINOGEN 0.2 08/11/2015 1513   UROBILINOGEN 0.2 01/08/2015 1215   NITRITE NEGATIVE 01/11/2017 0545   LEUKOCYTESUR LARGE (A) 01/11/2017 0545   Sepsis Labs: Invalid input(s): PROCALCITONIN, LACTICIDVEN  Recent Results (from the past 240 hour(s))  MRSA PCR Screening     Status: None   Collection Time: 01/11/17  5:40 AM  Result Value Ref Range Status   MRSA by PCR NEGATIVE NEGATIVE Final    Comment:        The GeneXpert MRSA Assay (FDA approved for NASAL specimens only), is one component of a comprehensive MRSA colonization surveillance program. It is not intended to diagnose MRSA infection nor to guide or monitor treatment for MRSA infections.   Culture, Urine     Status: Abnormal   Collection Time: 01/11/17  5:45 AM  Result Value Ref Range Status   Specimen Description URINE, CLEAN CATCH  Final   Special Requests NONE  Final   Culture >=100,000 COLONIES/mL KLEBSIELLA PNEUMONIAE (A)  Final   Report Status 01/13/2017 FINAL  Final   Organism ID, Bacteria KLEBSIELLA PNEUMONIAE (A)  Final      Susceptibility   Klebsiella pneumoniae - MIC*    AMPICILLIN >=32 RESISTANT Resistant     CEFAZOLIN <=4 SENSITIVE Sensitive     CEFTRIAXONE <=1 SENSITIVE  Sensitive     CIPROFLOXACIN <=0.25 SENSITIVE Sensitive     GENTAMICIN <=1 SENSITIVE Sensitive     IMIPENEM <=0.25 SENSITIVE Sensitive     NITROFURANTOIN 64 INTERMEDIATE Intermediate     TRIMETH/SULFA <=20 SENSITIVE Sensitive     AMPICILLIN/SULBACTAM 16 INTERMEDIATE Intermediate     PIP/TAZO 16 SENSITIVE Sensitive     Extended ESBL NEGATIVE Sensitive     * >=100,000 COLONIES/mL KLEBSIELLA PNEUMONIAE  C difficile quick scan w PCR reflex     Status: None   Collection Time: 01/11/17 12:16 PM  Result Value Ref Range Status   C Diff antigen NEGATIVE  NEGATIVE Final   C Diff toxin NEGATIVE NEGATIVE Final   C Diff interpretation No C. difficile detected.  Final  Gastrointestinal Panel by PCR , Stool     Status: None   Collection Time: 01/11/17 12:16 PM  Result Value Ref Range Status   Campylobacter species NOT DETECTED NOT DETECTED Final   Plesimonas shigelloides NOT DETECTED NOT DETECTED Final   Salmonella species NOT DETECTED NOT DETECTED Final   Yersinia enterocolitica NOT DETECTED NOT DETECTED Final   Vibrio species NOT DETECTED NOT DETECTED Final   Vibrio cholerae NOT DETECTED NOT DETECTED Final   Enteroaggregative E coli (EAEC) NOT DETECTED NOT DETECTED Final   Enteropathogenic E coli (EPEC) NOT DETECTED NOT DETECTED Final   Enterotoxigenic E coli (ETEC) NOT DETECTED NOT DETECTED Final   Shiga like toxin producing E coli (STEC) NOT DETECTED NOT DETECTED Final   Shigella/Enteroinvasive E coli (EIEC) NOT DETECTED NOT DETECTED Final   Cryptosporidium NOT DETECTED NOT DETECTED Final   Cyclospora cayetanensis NOT DETECTED NOT DETECTED Final   Entamoeba histolytica NOT DETECTED NOT DETECTED Final   Giardia lamblia NOT DETECTED NOT DETECTED Final   Adenovirus F40/41 NOT DETECTED NOT DETECTED Final   Astrovirus NOT DETECTED NOT DETECTED Final   Norovirus GI/GII NOT DETECTED NOT DETECTED Final   Rotavirus A NOT DETECTED NOT DETECTED Final   Sapovirus (I, II, IV, and V) NOT DETECTED NOT  DETECTED Final      Radiology Studies: No results found.   Scheduled Meds: . aspirin EC  81 mg Oral Daily  . atorvastatin  80 mg Oral q1800  . calcium carbonate  1 tablet Oral TID WC  . diphenhydrAMINE  25 mg Intravenous Q6H  . donepezil  10 mg Oral QHS  . feeding supplement (GLUCERNA SHAKE)  237 mL Oral TID BM  . folic acid  1 mg Oral Daily  . heparin  5,000 Units Subcutaneous Q8H  . methylPREDNISolone (SOLU-MEDROL) injection  60 mg Intravenous Q6H  . omega-3 acid ethyl esters  1 g Oral BID  . pantoprazole  40 mg Oral Daily  . saccharomyces boulardii  250 mg Oral BID  . sertraline  50 mg Oral Daily  . sodium chloride flush  3 mL Intravenous Q12H  . traMADol  50 mg Oral BID   Continuous Infusions: . sodium chloride 100 mL/hr at 01/13/17 1223  . cefTRIAXone (ROCEPHIN)  IV Stopped (01/13/17 1110)     Marzetta Board, MD, PhD [facility] Pager [phone]  If 7PM-7AM, please contact night-coverage [email or web address] Password [facility] 01/13/2017, 12:45 PM

## 2017-01-13 NOTE — Progress Notes (Signed)
PT Cancellation Note  Patient Details Name: [name] MRN: [id number] DOB: [birth year]   Cancelled Treatment:     PT deferred this date.  Charge nurse reports pt prepping to be taken to endo for procedure.  Will follow.   Quintara Bost 01/13/2017, 8:31 AM

## 2017-01-14 ENCOUNTER — Encounter (HOSPITAL_COMMUNITY): Payer: Self-pay | Admitting: Gastroenterology

## 2017-01-14 DIAGNOSIS — T782XXS Anaphylactic shock, unspecified, sequela: Secondary | ICD-10-CM

## 2017-01-14 LAB — RENAL FUNCTION PANEL
ANION GAP: 12 (ref 5–15)
Albumin: 1.8 g/dL — ABNORMAL LOW (ref 3.5–5.0)
BUN: 59 mg/dL — ABNORMAL HIGH (ref 6–20)
CHLORIDE: 103 mmol/L (ref 101–111)
CO2: 23 mmol/L (ref 22–32)
CREATININE: 2.94 mg/dL — AB (ref 0.44–1.00)
Calcium: 7.2 mg/dL — ABNORMAL LOW (ref 8.9–10.3)
GFR, EST AFRICAN AMERICAN: 18 mL/min — AB (ref >60)
GFR, EST NON AFRICAN AMERICAN: 16 mL/min — AB (ref >60)
Glucose, Bld: 241 mg/dL — ABNORMAL HIGH (ref 65–99)
Phosphorus: 6.2 mg/dL — ABNORMAL HIGH (ref 2.5–4.6)
Potassium: 4.3 mmol/L (ref 3.5–5.1)
Sodium: 138 mmol/L (ref 135–145)

## 2017-01-14 LAB — BASIC METABOLIC PANEL
ANION GAP: 10 (ref 5–15)
BUN: 59 mg/dL — ABNORMAL HIGH (ref 6–20)
CALCIUM: 7.2 mg/dL — AB (ref 8.9–10.3)
CO2: 24 mmol/L (ref 22–32)
Chloride: 104 mmol/L (ref 101–111)
Creatinine, Ser: 2.99 mg/dL — ABNORMAL HIGH (ref 0.44–1.00)
GFR, EST AFRICAN AMERICAN: 18 mL/min — AB (ref >60)
GFR, EST NON AFRICAN AMERICAN: 15 mL/min — AB (ref >60)
Glucose, Bld: 233 mg/dL — ABNORMAL HIGH (ref 65–99)
POTASSIUM: 4.4 mmol/L (ref 3.5–5.1)
Sodium: 138 mmol/L (ref 135–145)

## 2017-01-14 LAB — CBC
HCT: 26.9 % — ABNORMAL LOW (ref 36.0–46.0)
Hemoglobin: 8.7 g/dL — ABNORMAL LOW (ref 12.0–15.0)
MCH: 29.4 pg (ref 26.0–34.0)
MCHC: 32.3 g/dL (ref 30.0–36.0)
MCV: 90.9 fL (ref 78.0–100.0)
PLATELETS: 270 10*3/uL (ref 150–400)
RBC: 2.96 MIL/uL — AB (ref 3.87–5.11)
RDW: 16.4 % — ABNORMAL HIGH (ref 11.5–15.5)
WBC: 19.2 10*3/uL — AB (ref 4.0–10.5)

## 2017-01-14 LAB — MAGNESIUM: Magnesium: 2.5 mg/dL — ABNORMAL HIGH (ref 1.7–2.4)

## 2017-01-14 MED ORDER — HYDRALAZINE HCL 25 MG PO TABS
25.0000 mg | ORAL_TABLET | Freq: Three times a day (TID) | ORAL | Status: DC
  Administered 2017-01-14 – 2017-01-15 (×3): 25 mg via ORAL
  Filled 2017-01-14 (×3): qty 1

## 2017-01-14 MED ORDER — CARVEDILOL 6.25 MG PO TABS
6.2500 mg | ORAL_TABLET | Freq: Two times a day (BID) | ORAL | Status: DC
  Administered 2017-01-14 – 2017-01-17 (×6): 6.25 mg via ORAL
  Filled 2017-01-14 (×6): qty 1

## 2017-01-14 MED ORDER — METHYLPREDNISOLONE SODIUM SUCC 125 MG IJ SOLR
60.0000 mg | Freq: Two times a day (BID) | INTRAMUSCULAR | Status: DC
  Administered 2017-01-14 – 2017-01-15 (×2): 60 mg via INTRAVENOUS
  Filled 2017-01-14 (×2): qty 2

## 2017-01-14 NOTE — Clinical Social Work Note (Addendum)
01/14/17  Patient transferred to 1521 from [place].  Currently not stable for d/c back to [facility] at this time. CSW services will continue to follow for stability and assist with return to [facility] when appropriate. Lorie Phenix. Pauline Good, [place] (weekend coverage)

## 2017-01-14 NOTE — Progress Notes (Signed)
PROGRESS NOTE  [name] KCM:[id number] DOB: 1950-03-04 DOA: 01/10/2017 PCP: Arnoldo Morale, MD   LOS: 3 days   Brief Narrative / Interim history: 66 year old female with history of diabetes, hypertension, hyperlipidemia, stroke, peripheral vascular disease, coronary artery disease, chronic systolic heart failure with an ejection fraction of 25 just 30%, dementia, iron deficiency anemia, who presented with abdominal pain, nausea, dehydration, dehydration, with acute kidney injury.  She was found to have a urinary tract infection.  She had a CT scan of the abdomen and pelvis which did not demonstrate any obstruction secondary to her right kidney stone and there is no other acute change to explain worsening abdominal pain.  She did, however, is expected to have evidence of severe diffuse atherosclerosis with probable stenosis of the mesenteric and celiac arteries which would be consistent with chronic mesenteric ischemia which I think is her primary underlying problem.  She is feeling better with IV fluids and antibiotics.  Her hemoglobin trended down over the last 24 hours.  GI consulted.  Patient had anaphylaxis to feraheme on 11/22 requiring transfer to stepdown  Assessment & Plan: Principal Problem:   Acute kidney injury superimposed on chronic kidney disease ([place]) Active Problems:   Chronic obstructive pulmonary disease ([place])   Renal artery stenosis (HCC)   Diet-controlled diabetes mellitus (HCC)   Iron deficiency anemia   Stage 4 chronic kidney disease (HCC)   CAD (coronary artery disease)   Chronic diastolic CHF (congestive heart failure) (HCC)   Protein-calorie malnutrition (HCC)   AKI (acute kidney injury) ([place])   Dementia   Anemia   Abscess of groin   Anaphylaxis   Anaphylaxis and hives secondary to Feraheme, on 11/22 -Administered epinephrine x2 IM -Discontinue Benadryl 11/23 due to profound somnolence -Solu-Medrol 60 mg IV every 6 hours, changed to every 12 hours  today -Stable, transfer to floor today  Acute on chronic kidney disease, stage 4, with nongap metabolic acidosis -baseline creatinine is probably around 2.6g/dl and was initially 3.71 g/dl  -Likely has some ATN from dehydration and some CKD related to vascular disease (including renovascular disease), hypertension, and diabetes.  -Metabolic acidosis has corrected -Creatinine improving and now stable around 2.9, discontinue fluids today allow p.o. intake -repeat BMP  Nausea, vomiting, abdominal pain and diarrhea -LFTs stable. Lipase only minimally elevated.   Likely secondary to chronic mesenteric ischemia with a concomitant UTI -Continue ceftriaxone for now -Urine culture growing Klebsiella resistant/intermediate to ampicillin, nitrofurantoin, Unasyn -C. difficile and GI pathogen panel negative -Her symptoms have now resolved  Moderate malnutrition, still not eating much -Appreciate nutrition assistance -Continue regular diet with supplements  Previous multiple GI bleeds with erosive esophagitis, now with streaks of blood in emesis and stools, heme globin trending down, acute on chronic anemia.   -Occult stool positive -Continue PPI, discontinue heparin s.q. -Folic acid low.  Iron level and vitamin B12 wnl. -GI consultation, appreciate assistance -Capsule endoscopy read pending  Hematuriadue to right renal mass and 62mm stone -F/u with Urology. Dr. Janina Mayo at already scheduled appointment in Dec -Right renal abnormality appears to be resolved on CT from yesterday. -She has some subcentimeter lesions on both kidneys likely representing cysts.  There was a hyper attenuating lesion on the lower pole of the left kidney which is thought to be a hemorrhagic cyst and there is scarring in the midpole of the right kidney.  Diabetes mellitus type 2, A1c 5.6 in July  -hyperglycemia has resolved  Dementia, mild to moderate,needsreorientation, husband gives meds to her,  daughter  organizes -Continue aricept  Chronic diastolicand systolicheart YHCWCBJ,SE83-15% most recently, blood pressure still mildly hypotensive -Hold lasix due to dehydration  Hypertension -She was hypotensive during her anaphylactic episode, blood pressure is now normalized on the high side, resume Coreg, resume hydralazine  CAD,not candidate for PCI multivessel disease -HoldingCoregand hydralazine due to hypotension -Continue atorvastatin -Continue aspirin  Peripheral vascular disease status post left common iliac and external iliac artery stents as well as prior CVA on Plavix -continue aspirin  COPD  -stable, continue albuterol  Leftgroin abscess -Continue once daily silver dressing with dry dressing changes  Generalized weakness -PT/OT recommending skilled nursing  Iron deficiency anemia, on supplementation but worsening anemia -Iron studies normal -Folate deficiency, start folic acid -TSH 1.761 -Occult stool positive -Repeat hgb in AM -Received Feraheme on 11/22 which resulted in hives and anaphylaxis     DVT prophylaxis: SCD, discontinue heparin with concerns for GI bleed Code Status: DNR Family Communication: no family at bedside Disposition Plan: SNF when ready   Consultants:   GI  Procedures:   None   Antimicrobials:  None    Subjective: -Feeling well this morning, no longer has abdominal pain  Objective: Vitals:   01/14/17 0400 01/14/17 0444 01/14/17 0800 01/14/17 1107  BP:   (!) 167/48 (!) 152/42  Pulse: (!) 53  (!) 57 61  Resp: 10  10 14   Temp: 97.6 F (36.4 C)  97.7 F (36.5 C) (!) 97.5 F (36.4 C)  TempSrc: Oral  Oral Oral  SpO2: 93%  94% 100%  Weight:  53.6 kg (118 lb 2.7 oz)  59.9 kg (132 lb 0.9 oz)  Height:        Intake/Output Summary (Last 24 hours) at 01/14/2017 1229 Last data filed at 01/14/2017 6073 Gross per 24 hour  Intake 3033 ml  Output -  Net 3033 ml   Filed Weights   01/13/17 0500 01/14/17 0444  01/14/17 1107  Weight: 52.5 kg (115 lb 11.9 oz) 53.6 kg (118 lb 2.7 oz) 59.9 kg (132 lb 0.9 oz)    Examination:  Constitutional: NAD, calm, comfortable Eyes: lids and conjunctivae normal ENMT: Mucous membranes are moist.  Respiratory: clear to auscultation bilaterally, no wheezing, no crackles.  Cardiovascular: Regular rate and rhythm, no murmurs / rubs / gallops. Abdomen: no tenderness. Bowel sounds positive.   Data Reviewed: I have independently reviewed following labs and imaging studies   CBC: Recent Labs  Lab 01/10/17 1430 01/11/17 0519 01/12/17 0529 01/13/17 0315 01/14/17 0336  WBC 17.8* 9.5 11.7* 14.5* 19.2*  NEUTROABS 16.5*  --   --   --   --   HGB 9.7* 7.7* 7.4* 8.2* 8.7*  HCT 29.2* 22.1* 21.8* 24.3* 26.9*  MCV 87.7 86.3 86.9 89.3 90.9  PLT 270 215 198 211 710   Basic Metabolic Panel: Recent Labs  Lab 01/10/17 1430 01/11/17 0519 01/12/17 0529 01/13/17 0315 01/14/17 0336  NA 135 137 137 141 138  138  K 4.0 3.2* 3.6 4.3 4.3  4.4  CL 115* 110 99* 104 103  104  CO2 10* 16* 29 25 23  24   GLUCOSE 125* 69 80 122* 241*  233*  BUN 70* 64* 59* 55* 59*  59*  CREATININE 3.71* 3.26* 3.02* 2.97* 2.94*  2.99*  CALCIUM 8.1* 7.5* 6.8* 7.2* 7.2*  7.2*  MG  --  1.7 1.4* 2.9* 2.5*  PHOS  --  6.6* 3.9 5.8* 6.2*   GFR: Estimated Creatinine Clearance: 14.8 mL/min (A) (by C-G formula based  on SCr of 2.94 mg/dL (H)). Liver Function Tests: Recent Labs  Lab 01/10/17 1430 01/11/17 0519 01/12/17 0529 01/13/17 0315 01/14/17 0336  AST 20  --   --   --   --   ALT 16  --   --   --   --   ALKPHOS 93  --   --   --   --   BILITOT 0.6  --   --   --   --   PROT 5.3*  --   --   --   --   ALBUMIN 2.1* 1.7* 1.6* 1.8* 1.8*   Recent Labs  Lab 01/10/17 1430  LIPASE 70*   No results for input(s): AMMONIA in the last 168 hours. Coagulation Profile: No results for input(s): INR, PROTIME in the last 168 hours. Cardiac Enzymes: No results for input(s): CKTOTAL, CKMB,  CKMBINDEX, TROPONINI in the last 168 hours. BNP (last 3 results) No results for input(s): PROBNP in the last 8760 hours. HbA1C: No results for input(s): HGBA1C in the last 72 hours. CBG: Recent Labs  Lab 01/11/17 0808 01/11/17 0859 01/11/17 1150 01/11/17 1656 01/11/17 2022  GLUCAP 62* 122* 113* 152* 158*   Lipid Profile: No results for input(s): CHOL, HDL, LDLCALC, TRIG, CHOLHDL, LDLDIRECT in the last 72 hours. Thyroid Function Tests: No results for input(s): TSH, T4TOTAL, FREET4, T3FREE, THYROIDAB in the last 72 hours. Anemia Panel: No results for input(s): VITAMINB12, FOLATE, FERRITIN, TIBC, IRON, RETICCTPCT in the last 72 hours. Urine analysis:    Component Value Date/Time   COLORURINE YELLOW 01/11/2017 0545   APPEARANCEUR CLOUDY (A) 01/11/2017 0545   LABSPEC 1.010 01/11/2017 0545   PHURINE 5.0 01/11/2017 0545   GLUCOSEU NEGATIVE 01/11/2017 0545   HGBUR NEGATIVE 01/11/2017 0545   BILIRUBINUR NEGATIVE 01/11/2017 0545   BILIRUBINUR negative 08/11/2015 1513   [place] 01/11/2017 0545   PROTEINUR NEGATIVE 01/11/2017 0545   UROBILINOGEN 0.2 08/11/2015 1513   UROBILINOGEN 0.2 01/08/2015 1215   NITRITE NEGATIVE 01/11/2017 0545   LEUKOCYTESUR LARGE (A) 01/11/2017 0545   Sepsis Labs: Invalid input(s): PROCALCITONIN, LACTICIDVEN  Recent Results (from the past 240 hour(s))  MRSA PCR Screening     Status: None   Collection Time: 01/11/17  5:40 AM  Result Value Ref Range Status   MRSA by PCR NEGATIVE NEGATIVE Final    Comment:        The GeneXpert MRSA Assay (FDA approved for NASAL specimens only), is one component of a comprehensive MRSA colonization surveillance program. It is not intended to diagnose MRSA infection nor to guide or monitor treatment for MRSA infections.   Culture, Urine     Status: Abnormal   Collection Time: 01/11/17  5:45 AM  Result Value Ref Range Status   Specimen Description URINE, CLEAN CATCH  Final   Special Requests NONE  Final    Culture >=100,000 COLONIES/mL KLEBSIELLA PNEUMONIAE (A)  Final   Report Status 01/13/2017 FINAL  Final   Organism ID, Bacteria KLEBSIELLA PNEUMONIAE (A)  Final      Susceptibility   Klebsiella pneumoniae - MIC*    AMPICILLIN >=32 RESISTANT Resistant     CEFAZOLIN <=4 SENSITIVE Sensitive     CEFTRIAXONE <=1 SENSITIVE Sensitive     CIPROFLOXACIN <=0.25 SENSITIVE Sensitive     GENTAMICIN <=1 SENSITIVE Sensitive     IMIPENEM <=0.25 SENSITIVE Sensitive     NITROFURANTOIN 64 INTERMEDIATE Intermediate     TRIMETH/SULFA <=20 SENSITIVE Sensitive     AMPICILLIN/SULBACTAM 16 INTERMEDIATE Intermediate  PIP/TAZO 16 SENSITIVE Sensitive     Extended ESBL NEGATIVE Sensitive     * >=100,000 COLONIES/mL KLEBSIELLA PNEUMONIAE  C difficile quick scan w PCR reflex     Status: None   Collection Time: 01/11/17 12:16 PM  Result Value Ref Range Status   C Diff antigen NEGATIVE NEGATIVE Final   C Diff toxin NEGATIVE NEGATIVE Final   C Diff interpretation No C. difficile detected.  Final  Gastrointestinal Panel by PCR , Stool     Status: None   Collection Time: 01/11/17 12:16 PM  Result Value Ref Range Status   Campylobacter species NOT DETECTED NOT DETECTED Final   Plesimonas shigelloides NOT DETECTED NOT DETECTED Final   Salmonella species NOT DETECTED NOT DETECTED Final   Yersinia enterocolitica NOT DETECTED NOT DETECTED Final   Vibrio species NOT DETECTED NOT DETECTED Final   Vibrio cholerae NOT DETECTED NOT DETECTED Final   Enteroaggregative E coli (EAEC) NOT DETECTED NOT DETECTED Final   Enteropathogenic E coli (EPEC) NOT DETECTED NOT DETECTED Final   Enterotoxigenic E coli (ETEC) NOT DETECTED NOT DETECTED Final   Shiga like toxin producing E coli (STEC) NOT DETECTED NOT DETECTED Final   Shigella/Enteroinvasive E coli (EIEC) NOT DETECTED NOT DETECTED Final   Cryptosporidium NOT DETECTED NOT DETECTED Final   Cyclospora cayetanensis NOT DETECTED NOT DETECTED Final   Entamoeba histolytica NOT  DETECTED NOT DETECTED Final   Giardia lamblia NOT DETECTED NOT DETECTED Final   Adenovirus F40/41 NOT DETECTED NOT DETECTED Final   Astrovirus NOT DETECTED NOT DETECTED Final   Norovirus GI/GII NOT DETECTED NOT DETECTED Final   Rotavirus A NOT DETECTED NOT DETECTED Final   Sapovirus (I, II, IV, and V) NOT DETECTED NOT DETECTED Final     Radiology Studies: No results found.  Scheduled Meds: . aspirin EC  81 mg Oral Daily  . atorvastatin  80 mg Oral q1800  . calcium carbonate  1 tablet Oral TID WC  . donepezil  10 mg Oral QHS  . feeding supplement (GLUCERNA SHAKE)  237 mL Oral TID BM  . folic acid  1 mg Oral Daily  . methylPREDNISolone (SOLU-MEDROL) injection  60 mg Intravenous Q12H  . omega-3 acid ethyl esters  1 g Oral BID  . pantoprazole  40 mg Oral Daily  . saccharomyces boulardii  250 mg Oral BID  . sertraline  50 mg Oral Daily  . sodium chloride flush  3 mL Intravenous Q12H  . traMADol  50 mg Oral BID   Continuous Infusions: . sodium chloride 100 mL/hr at 01/13/17 1223  . cefTRIAXone (ROCEPHIN)  IV Stopped (01/14/17 0933)    Marzetta Board, MD, PhD [facility] Pager [phone]  If 7PM-7AM, please contact night-coverage [email or web address] Password [email or web address] 01/14/2017, 12:29 PM

## 2017-01-14 NOTE — Progress Notes (Addendum)
    Progress Note   Subjective  Chief Complaint: Anemia, abdominal pain  This morning, the patient is found in the ICU, she appears well.  She is unaware of why she was transferred to this unit last night.  Patient tells me she has not seen any more blood in her stool.  She denies any abdominal pain today.  She denies any new complaints.  Per review of notes patient was transferred to the ICU after anaphylaxis from [facility] yesterday.   Objective   Vital signs in last 24 hours: Temp:  [97.6 F (36.4 C)-98.1 F (36.7 C)] 97.7 F (36.5 C) (11/24 0800) Pulse Rate:  [52-60] 53 (11/24 0400) Resp:  [9-26] 10 (11/24 0400) BP: (120-161)/(28-113) 131/113 (11/24 0200) SpO2:  [92 %-100 %] 93 % (11/24 0400) Weight:  [118 lb 2.7 oz (53.6 kg)] 118 lb 2.7 oz (53.6 kg) (11/24 0444) Last BM Date: 01/13/17 General:    Elderly Caucasian female in NAD Heart:  Regular rate and rhythm; no murmurs Lungs: Respirations even and unlabored, lungs CTA bilaterally Abdomen:  Soft, nontender and nondistended. Normal bowel sounds. Extremities:  Without edema. Neurologic:  Alert and oriented,  grossly normal neurologically. Psych:  Cooperative. Normal mood and affect.  Intake/Output from previous day: 11/23 0701 - 11/24 0700 In: 3030 [P.O.:480; I.V.:2500; IV Piggyback:50] Out: -   Lab Results: Recent Labs    01/12/17 0529 01/13/17 0315 01/14/17 0336  WBC 11.7* 14.5* 19.2*  HGB 7.4* 8.2* 8.7*  HCT 21.8* 24.3* 26.9*  PLT 198 211 270   BMET Recent Labs    01/12/17 0529 01/13/17 0315 01/14/17 0336  NA 137 141 138  138  K 3.6 4.3 4.3  4.4  CL 99* 104 103  104  CO2 29 25 23  24   GLUCOSE 80 122* 241*  233*  BUN 59* 55* 59*  59*  CREATININE 3.02* 2.97* 2.94*  2.99*  CALCIUM 6.8* 7.2* 7.2*  7.2*   LFT Recent Labs    01/14/17 0336  ALBUMIN 1.8*    Assessment / Plan:   Assessment: 1.  Abdominal pain: Thought related to cystitis, patient does not complain of abdominal pain today 2.   History of angiectasia of the small bowel 3.  Melena: Likely from above, hemoglobin has remained stable over the past 24 hours, pill capsule endoscopy pending 4.  Anemia: With above 5.  Anaphylaxis to Feraheme: Patient currently in the ICU, status post steroids and Benadryl as well as epinephrine yesterday, plans to transfer her back to the floor if she remains stable today  Plan: 1.  Continue supportive measures 2.  Small bowel pill capsule endoscopy pending, hopefully this will be read later today. 3.  Please await any further recommendations from Dr. Silverio Decamp.  Thank you for your kind consultation, we will continue to follow   LOS: 3 days   Levin Erp  01/14/2017, 9:01 AM  Pager # [phone]   Attending physician's note   I have taken an interval history, reviewed the chart and examined the patient. I agree with the Advanced Practitioner's note, impression and recommendations.  Patient had a reaction to [facility], developed hives and ?  Anaphylaxis.  Improved with IV Benadryl and steroids. Hemodynamically stable.  Hemoglobin stable. Damaris Hippo, MD [phone] Mon-Fri 8a-5p [phone] after 5p, weekends, holidays

## 2017-01-15 LAB — CBC
HCT: 25.7 % — ABNORMAL LOW (ref 36.0–46.0)
HEMOGLOBIN: 8.4 g/dL — AB (ref 12.0–15.0)
MCH: 30 pg (ref 26.0–34.0)
MCHC: 32.7 g/dL (ref 30.0–36.0)
MCV: 91.8 fL (ref 78.0–100.0)
PLATELETS: 252 10*3/uL (ref 150–400)
RBC: 2.8 MIL/uL — AB (ref 3.87–5.11)
RDW: 16.3 % — ABNORMAL HIGH (ref 11.5–15.5)
WBC: 16.3 10*3/uL — AB (ref 4.0–10.5)

## 2017-01-15 LAB — RENAL FUNCTION PANEL
ANION GAP: 11 (ref 5–15)
Albumin: 1.9 g/dL — ABNORMAL LOW (ref 3.5–5.0)
BUN: 65 mg/dL — ABNORMAL HIGH (ref 6–20)
CALCIUM: 7.5 mg/dL — AB (ref 8.9–10.3)
CHLORIDE: 103 mmol/L (ref 101–111)
CO2: 22 mmol/L (ref 22–32)
CREATININE: 2.96 mg/dL — AB (ref 0.44–1.00)
GFR, EST AFRICAN AMERICAN: 18 mL/min — AB (ref >60)
GFR, EST NON AFRICAN AMERICAN: 15 mL/min — AB (ref >60)
Glucose, Bld: 214 mg/dL — ABNORMAL HIGH (ref 65–99)
Phosphorus: 6.1 mg/dL — ABNORMAL HIGH (ref 2.5–4.6)
Potassium: 4.5 mmol/L (ref 3.5–5.1)
Sodium: 136 mmol/L (ref 135–145)

## 2017-01-15 LAB — MAGNESIUM: MAGNESIUM: 2.3 mg/dL (ref 1.7–2.4)

## 2017-01-15 LAB — GLUCOSE, CAPILLARY
Glucose-Capillary: 296 mg/dL — ABNORMAL HIGH (ref 65–99)
Glucose-Capillary: 174 mg/dL — ABNORMAL HIGH (ref 65–99)
Glucose-Capillary: 157 mg/dL — ABNORMAL HIGH (ref 65–99)

## 2017-01-15 MED ORDER — PEG-KCL-NACL-NASULF-NA ASC-C 100 G PO SOLR
0.5000 | Freq: Once | ORAL | Status: AC
  Administered 2017-01-15: 100 g via ORAL

## 2017-01-15 MED ORDER — HYDRALAZINE HCL 20 MG/ML IJ SOLN: 10.0000 mg | INTRAMUSCULAR | Status: DC | PRN

## 2017-01-15 MED ORDER — METOCLOPRAMIDE HCL 5 MG/ML IJ SOLN
10.0000 mg | INTRAMUSCULAR | Status: AC
  Administered 2017-01-15 (×2): 10 mg via INTRAVENOUS
  Filled 2017-01-15 (×2): qty 2

## 2017-01-15 MED ORDER — AMLODIPINE BESYLATE 5 MG PO TABS
5.0000 mg | ORAL_TABLET | Freq: Every day | ORAL | Status: DC
  Administered 2017-01-15 – 2017-01-17 (×3): 5 mg via ORAL
  Filled 2017-01-15 (×3): qty 1

## 2017-01-15 MED ORDER — HYDRALAZINE HCL 50 MG PO TABS
50.0000 mg | ORAL_TABLET | Freq: Three times a day (TID) | ORAL | Status: DC
  Administered 2017-01-15 – 2017-01-17 (×7): 50 mg via ORAL
  Filled 2017-01-15 (×7): qty 1

## 2017-01-15 MED ORDER — INSULIN ASPART 100 UNIT/ML ~~LOC~~ SOLN
0.0000 [IU] | Freq: Three times a day (TID) | SUBCUTANEOUS | Status: DC
  Administered 2017-01-15: 5 [IU] via SUBCUTANEOUS
  Administered 2017-01-15: 2 [IU] via SUBCUTANEOUS

## 2017-01-15 MED ORDER — PEG-KCL-NACL-NASULF-NA ASC-C 100 G PO SOLR: 1.0000 | Freq: Once | ORAL | Status: DC

## 2017-01-15 MED ORDER — PEG-KCL-NACL-NASULF-NA ASC-C 100 G PO SOLR
0.5000 | Freq: Once | ORAL | Status: AC
  Administered 2017-01-15: 100 g via ORAL
  Filled 2017-01-15: qty 1

## 2017-01-15 MED ORDER — FUROSEMIDE 40 MG PO TABS
40.0000 mg | ORAL_TABLET | Freq: Two times a day (BID) | ORAL | Status: DC
  Administered 2017-01-15 – 2017-01-17 (×5): 40 mg via ORAL
  Filled 2017-01-15 (×5): qty 1

## 2017-01-15 MED ORDER — PREDNISONE 20 MG PO TABS
20.0000 mg | ORAL_TABLET | Freq: Every day | ORAL | Status: DC
  Administered 2017-01-17: 20 mg via ORAL
  Filled 2017-01-15: qty 1

## 2017-01-15 NOTE — H&P (View-Only) (Signed)
Progress Note   Subjective  Chief Complaint: Anemia, abdominal pain  Patient back on the floor after episode of anaphylaxis from ferriheme yesterday.  Patient reports she is doing well and "feels pretty good".  Patient denies any further episodes of bleeding.  She denies any new complaints.    Objective   Vital signs in last 24 hours: Temp:  [97.5 F (36.4 C)-98.3 F (36.8 C)] 98.1 F (36.7 C) (11/25 0602) Pulse Rate:  [58-62] 62 (11/25 0821) Resp:  [14-15] 15 (11/25 0602) BP: (150-192)/(42-92) 192/65 (11/25 0821) SpO2:  [96 %-100 %] 96 % (11/25 0602) Weight:  [132 lb 0.9 oz (59.9 kg)] 132 lb 0.9 oz (59.9 kg) (11/24 1107) Last BM Date: 01/14/17 General:    Elderly Caucasian female in NAD Heart:  Regular rate and rhythm; no murmurs Lungs: Respirations even and unlabored, lungs CTA bilaterally Abdomen:  Soft, nontender and nondistended. Normal bowel sounds. Extremities:  Without edema. Neurologic:  Alert and oriented,  grossly normal neurologically. Psych:  Cooperative. Normal mood and affect.  Intake/Output from previous day: 11/24 0701 - 11/25 0700 In: 1071.3 [P.O.:300; I.V.:771.3] Out: -  Intake/Output this shift: Total I/O In: 290 [P.O.:240; IV Piggyback:50] Out: -   Lab Results: Recent Labs    01/13/17 0315 01/14/17 0336 01/15/17 0500  WBC 14.5* 19.2* 16.3*  HGB 8.2* 8.7* 8.4*  HCT 24.3* 26.9* 25.7*  PLT 211 270 252   BMET Recent Labs    01/13/17 0315 01/14/17 0336 01/15/17 0500  NA 141 138  138 136  K 4.3 4.3  4.4 4.5  CL 104 103  104 103  CO2 25 23  24 22   GLUCOSE 122* 241*  233* 214*  BUN 55* 59*  59* 65*  CREATININE 2.97* 2.94*  2.99* 2.96*  CALCIUM 7.2* 7.2*  7.2* 7.5*   LFT Recent Labs    01/15/17 0500  ALBUMIN 1.9*      Assessment / Plan:   Assessment: 1.  Abdominal pain: Patient with no complaints of abdominal pain today 2.  History of angiectasia of the small bowel 3.  Melena: Thought related to above, pill capsule  endoscopy pending 4.  Anemia currently stable, likely related to above 5.  Anaphylaxis to feraheme: Patient now improved after steroids, Benadryl and Epinephrine  Plan: 1.  Continue supportive measures with monitoring of hemoglobin and transfusion less than 7 2.  Small Pill capsule endoscopy pending. There were technical difficulties yesterday preventing reading.  This is being read currently. Results expected later today. 3.  Please await any further recommendations from Dr. Silverio Decamp later.   Thank you for your kind consultation, will continue to follow    LOS: 4 days   Levin Erp  01/15/2017, 10:05 AM  Pager # [phone]  Addendum: Spoke with Dr. Silverio Decamp, ulcers visualized throughout the small bowel. Recommending Colonoscopy tomorrow for further evaluation and possible bx for further diagnosis.  Spoke with patient who verbalized understanding. She will be on clears today and NPO after midnight. Prescribed Reglan to be given before prep to help with nausea.  Ellouise Newer, PA-C 01/15/17 1122   Attending physician's note   I have taken an interval history, reviewed the chart and examined the patient. I agree with the Advanced Practitioner's note, impression and recommendations.   Noted multiple small bowel erosions and aphthous ulcers, also 1-2  ulcers in the visualized portion of the right colon.  Patient has had chronic anemia with fecal Hemoccult positive stool suggestive of anemia secondary to  chronic GI blood loss. She had erosions on her last small bowel capsule endoscopy in 2005, but was an incomplete study.  Last colonoscopy in August 2008, patient is due for colorectal cancer screening and will also need to evaluate the etiology for small bowel and colon erosions. ?  NSAID related versus Crohn's disease versus chronic bowel ischemia. Discussed results of capsule endoscopy with patient and she agrees to proceed with colonoscopy.   Damaris Hippo,  MD [phone] Mon-Fri 8a-5p [phone] after 5p, weekends, holidays

## 2017-01-15 NOTE — Progress Notes (Addendum)
PROGRESS NOTE  [name] JAS:[id number] DOB: 03-28-1950 DOA: 01/10/2017 PCP: Arnoldo Morale, MD   LOS: 4 days   Brief Narrative / Interim history: 66 year old female with history of diabetes, hypertension, hyperlipidemia, stroke, peripheral vascular disease, coronary artery disease, chronic systolic heart failure with an ejection fraction of 25 just 30%, dementia, iron deficiency anemia, who presented with abdominal pain, nausea, dehydration, dehydration, with acute kidney injury.  She was found to have a urinary tract infection.  She had a CT scan of the abdomen and pelvis which did not demonstrate any obstruction secondary to her right kidney stone and there is no other acute change to explain worsening abdominal pain.  She did, however, is expected to have evidence of severe diffuse atherosclerosis with probable stenosis of the mesenteric and celiac arteries which would be consistent with chronic mesenteric ischemia which I think is her primary underlying problem.  She is feeling better with IV fluids and antibiotics.  Her hemoglobin trended down over the last 24 hours.  GI consulted.  Patient had anaphylaxis to feraheme on 11/22 requiring transfer to stepdown, improved and was sent back to floor on 11/24  Assessment & Plan: Principal Problem:   Acute kidney injury superimposed on chronic kidney disease ([place]) Active Problems:   Chronic obstructive pulmonary disease (HCC)   Renal artery stenosis (HCC)   Diet-controlled diabetes mellitus (HCC)   Iron deficiency anemia   Stage 4 chronic kidney disease (HCC)   CAD (coronary artery disease)   Chronic diastolic CHF (congestive heart failure) (HCC)   Protein-calorie malnutrition (HCC)   AKI (acute kidney injury) ([place])   Dementia   Anemia   Abscess of groin   Anaphylaxis  Previous multiple GI bleeds with erosive esophagitis, now with streaks of blood in emesis and stools, heme globin trending down, acute on chronic anemia.   -Occult stool  positive -Continue PPI, discontinued heparin s.q. -Folic acid low.  Iron level and vitamin B12 wnl. -GI consultation, appreciate assistance -Capsule endoscopy with ulcers visualized throughout the small bowel, and recommendations at this point is to have endoscopy on Monday 11/26  Anaphylaxis and hives secondary to [facility], on 11/22 -Administered epinephrine x2 IM -Discontinue Benadryl 11/23 due to profound somnolence -Solu-Medrol 60 mg IV every 6 hours, changed to every 12 hours on 11/25, still has skin rash, will transition to prednisone  Acute on chronic kidney disease, stage 4, with nongap metabolic acidosis -baseline creatinine is probably around 2.6g/dl and was initially 3.71 g/dl  -Likely has some ATN from dehydration and some CKD related to vascular disease (including renovascular disease), hypertension, and diabetes.  -Metabolic acidosis has corrected -Creatinine improving and now stable around 2.9 -Resume Lasix today, closely monitor  Nausea, vomiting, abdominal pain and diarrhea -LFTs stable. Lipase only minimally elevated.   Likely secondary to chronic mesenteric ischemia with a concomitant UTI -Continue ceftriaxone for now -Urine culture growing Klebsiella resistant/intermediate to ampicillin, nitrofurantoin, Unasyn -C. difficile and GI pathogen panel negative -Her symptoms have now resolved  Moderate malnutrition, still not eating much -Appreciate nutrition assistance -Continue regular diet with supplements  Hematuriadue to right renal mass and 68m stone -F/u with Urology. Dr. EJanina Mayoat already scheduled appointment in Dec -Right renal abnormality appears to be resolved on CT -She has some subcentimeter lesions on both kidneys likely representing cysts.  There was a hyper attenuating lesion on the lower pole of the left kidney which is thought to be a hemorrhagic cyst and there is scarring in the midpole of the right  kidney.  Diabetes mellitus type 2, A1c 5.6  in July  -Morning CBGs have been on the high side likely due to steroids, continue sliding scale  Dementia, mild to moderate,needsreorientation, husband gives meds to her, daughter organizes -Continue aricept  Chronic diastolicand systolicheart WTUUEKC,MK34-91% most recently, -resume Lasix today  Hypertension -She was hypotensive during her anaphylactic episode, blood pressure is now normalized on the high side, resumed Coreg, resumed hydralazine on 11/24 -Resume Lasix today, increase hydralazine, continue IV hydralazine as needed  CAD,not candidate for PCI multivessel disease -HoldingCoregand hydralazine due to hypotension -Continue atorvastatin -Continue aspirin  Peripheral vascular disease status post left common iliac and external iliac artery stents as well as prior CVA on Plavix -on aspirin  COPD  -stable, continue albuterol  Leftgroin abscess -Continue once daily silver dressing with dry dressing changes  Generalized weakness -PT/OT recommending skilled nursing  Iron deficiency anemia, on supplementation but worsening anemia -Iron studies normal -Folate deficiency, start folic acid -TSH 7.915 -Occult stool positive -Repeat hgb in AM -Received Feraheme on 11/22 which resulted in hives and anaphylaxis     DVT prophylaxis: SCD, discontinue heparin with concerns for GI bleed Code Status: DNR Family Communication: no family at bedside Disposition Plan: SNF when ready, to have endoscopy tomorrow per GI  Consultants:   GI  Procedures:   None   Antimicrobials:  None    Subjective: -No complaints, no abdominal pain, nausea or vomiting.  No shortness of breath.  No chest pain.  Objective: Vitals:   01/14/17 1725 01/14/17 2109 01/15/17 0602 01/15/17 0821  BP: (!) 150/92 (!) 155/65 (!) 181/55 (!) 192/65  Pulse: 61 (!) 58 (!) 59 62  Resp:  15 15   Temp:  98.3 F (36.8 C) 98.1 F (36.7 C)   TempSrc:  Oral Oral   SpO2:  96% 96%     Weight:      Height:        Intake/Output Summary (Last 24 hours) at 01/15/2017 1147 Last data filed at 01/15/2017 0827 Gross per 24 hour  Intake 1358.33 ml  Output -  Net 1358.33 ml   Filed Weights   01/13/17 0500 01/14/17 0444 01/14/17 1107  Weight: 52.5 kg (115 lb 11.9 oz) 53.6 kg (118 lb 2.7 oz) 59.9 kg (132 lb 0.9 oz)    Examination:  Constitutional: NAD Eyes: Lids and conjunctivae normal, no scleral icterus  ENMT: Moist mucous membranes  Respiratory: Clear to auscultation bilaterally, no wheezing, no crackles  Cardiovascular: Regular rate and rhythm, no murmurs, no lower extremity edema.  2+ peripheral pulses. Abdomen: Nontender to palpation, bowel sounds positive, no masses palpated Skin: Allergic rash on anterior chest, arms, improving  Data Reviewed: I have independently reviewed following labs and imaging studies   CBC: Recent Labs  Lab 01/10/17 1430 01/11/17 0519 01/12/17 0529 01/13/17 0315 01/14/17 0336 01/15/17 0500  WBC 17.8* 9.5 11.7* 14.5* 19.2* 16.3*  NEUTROABS 16.5*  --   --   --   --   --   HGB 9.7* 7.7* 7.4* 8.2* 8.7* 8.4*  HCT 29.2* 22.1* 21.8* 24.3* 26.9* 25.7*  MCV 87.7 86.3 86.9 89.3 90.9 91.8  PLT 270 215 198 211 270 056   Basic Metabolic Panel: Recent Labs  Lab 01/11/17 0519 01/12/17 0529 01/13/17 0315 01/14/17 0336 01/15/17 0500  NA 137 137 141 138  138 136  K 3.2* 3.6 4.3 4.3  4.4 4.5  CL 110 99* 104 103  104 103  CO2 16* 29 25 23  24 22  GLUCOSE 69 80 122* 241*  233* 214*  BUN 64* 59* 55* 59*  59* 65*  CREATININE 3.26* 3.02* 2.97* 2.94*  2.99* 2.96*  CALCIUM 7.5* 6.8* 7.2* 7.2*  7.2* 7.5*  MG 1.7 1.4* 2.9* 2.5* 2.3  PHOS 6.6* 3.9 5.8* 6.2* 6.1*   GFR: Estimated Creatinine Clearance: 14.7 mL/min (A) (by C-G formula based on SCr of 2.96 mg/dL (H)). Liver Function Tests: Recent Labs  Lab 01/10/17 1430 01/11/17 0519 01/12/17 0529 01/13/17 0315 01/14/17 0336 01/15/17 0500  AST 20  --   --   --   --   --   ALT  16  --   --   --   --   --   ALKPHOS 93  --   --   --   --   --   BILITOT 0.6  --   --   --   --   --   PROT 5.3*  --   --   --   --   --   ALBUMIN 2.1* 1.7* 1.6* 1.8* 1.8* 1.9*   Recent Labs  Lab 01/10/17 1430  LIPASE 70*   No results for input(s): AMMONIA in the last 168 hours. Coagulation Profile: No results for input(s): INR, PROTIME in the last 168 hours. Cardiac Enzymes: No results for input(s): CKTOTAL, CKMB, CKMBINDEX, TROPONINI in the last 168 hours. BNP (last 3 results) No results for input(s): PROBNP in the last 8760 hours. HbA1C: No results for input(s): HGBA1C in the last 72 hours. CBG: Recent Labs  Lab 01/11/17 0808 01/11/17 0859 01/11/17 1150 01/11/17 1656 01/11/17 2022  GLUCAP 62* 122* 113* 152* 158*   Lipid Profile: No results for input(s): CHOL, HDL, LDLCALC, TRIG, CHOLHDL, LDLDIRECT in the last 72 hours. Thyroid Function Tests: No results for input(s): TSH, T4TOTAL, FREET4, T3FREE, THYROIDAB in the last 72 hours. Anemia Panel: No results for input(s): VITAMINB12, FOLATE, FERRITIN, TIBC, IRON, RETICCTPCT in the last 72 hours. Urine analysis:    Component Value Date/Time   COLORURINE YELLOW 01/11/2017 0545   APPEARANCEUR CLOUDY (A) 01/11/2017 0545   LABSPEC 1.010 01/11/2017 0545   PHURINE 5.0 01/11/2017 0545   GLUCOSEU NEGATIVE 01/11/2017 0545   HGBUR NEGATIVE 01/11/2017 0545   BILIRUBINUR NEGATIVE 01/11/2017 0545   BILIRUBINUR negative 08/11/2015 1513   [place] 01/11/2017 0545   PROTEINUR NEGATIVE 01/11/2017 0545   UROBILINOGEN 0.2 08/11/2015 1513   UROBILINOGEN 0.2 01/08/2015 1215   NITRITE NEGATIVE 01/11/2017 0545   LEUKOCYTESUR LARGE (A) 01/11/2017 0545   Sepsis Labs: Invalid input(s): PROCALCITONIN, LACTICIDVEN  Recent Results (from the past 240 hour(s))  MRSA PCR Screening     Status: None   Collection Time: 01/11/17  5:40 AM  Result Value Ref Range Status   MRSA by PCR NEGATIVE NEGATIVE Final    Comment:        The  GeneXpert MRSA Assay (FDA approved for NASAL specimens only), is one component of a comprehensive MRSA colonization surveillance program. It is not intended to diagnose MRSA infection nor to guide or monitor treatment for MRSA infections.   Culture, Urine     Status: Abnormal   Collection Time: 01/11/17  5:45 AM  Result Value Ref Range Status   Specimen Description URINE, CLEAN CATCH  Final   Special Requests NONE  Final   Culture >=100,000 COLONIES/mL KLEBSIELLA PNEUMONIAE (A)  Final   Report Status 01/13/2017 FINAL  Final   Organism ID, Bacteria KLEBSIELLA PNEUMONIAE (A)  Final  Susceptibility   Klebsiella pneumoniae - MIC*    AMPICILLIN >=32 RESISTANT Resistant     CEFAZOLIN <=4 SENSITIVE Sensitive     CEFTRIAXONE <=1 SENSITIVE Sensitive     CIPROFLOXACIN <=0.25 SENSITIVE Sensitive     GENTAMICIN <=1 SENSITIVE Sensitive     IMIPENEM <=0.25 SENSITIVE Sensitive     NITROFURANTOIN 64 INTERMEDIATE Intermediate     TRIMETH/SULFA <=20 SENSITIVE Sensitive     AMPICILLIN/SULBACTAM 16 INTERMEDIATE Intermediate     PIP/TAZO 16 SENSITIVE Sensitive     Extended ESBL NEGATIVE Sensitive     * >=100,000 COLONIES/mL KLEBSIELLA PNEUMONIAE  C difficile quick scan w PCR reflex     Status: None   Collection Time: 01/11/17 12:16 PM  Result Value Ref Range Status   C Diff antigen NEGATIVE NEGATIVE Final   C Diff toxin NEGATIVE NEGATIVE Final   C Diff interpretation No C. difficile detected.  Final  Gastrointestinal Panel by PCR , Stool     Status: None   Collection Time: 01/11/17 12:16 PM  Result Value Ref Range Status   Campylobacter species NOT DETECTED NOT DETECTED Final   Plesimonas shigelloides NOT DETECTED NOT DETECTED Final   Salmonella species NOT DETECTED NOT DETECTED Final   Yersinia enterocolitica NOT DETECTED NOT DETECTED Final   Vibrio species NOT DETECTED NOT DETECTED Final   Vibrio cholerae NOT DETECTED NOT DETECTED Final   Enteroaggregative E coli (EAEC) NOT DETECTED  NOT DETECTED Final   Enteropathogenic E coli (EPEC) NOT DETECTED NOT DETECTED Final   Enterotoxigenic E coli (ETEC) NOT DETECTED NOT DETECTED Final   Shiga like toxin producing E coli (STEC) NOT DETECTED NOT DETECTED Final   Shigella/Enteroinvasive E coli (EIEC) NOT DETECTED NOT DETECTED Final   Cryptosporidium NOT DETECTED NOT DETECTED Final   Cyclospora cayetanensis NOT DETECTED NOT DETECTED Final   Entamoeba histolytica NOT DETECTED NOT DETECTED Final   Giardia lamblia NOT DETECTED NOT DETECTED Final   Adenovirus F40/41 NOT DETECTED NOT DETECTED Final   Astrovirus NOT DETECTED NOT DETECTED Final   Norovirus GI/GII NOT DETECTED NOT DETECTED Final   Rotavirus A NOT DETECTED NOT DETECTED Final   Sapovirus (I, II, IV, and V) NOT DETECTED NOT DETECTED Final     Radiology Studies: No results found.  Scheduled Meds: . amLODipine  5 mg Oral Daily  . aspirin EC  81 mg Oral Daily  . atorvastatin  80 mg Oral q1800  . calcium carbonate  1 tablet Oral TID WC  . carvedilol  6.25 mg Oral BID WC  . donepezil  10 mg Oral QHS  . feeding supplement (GLUCERNA SHAKE)  237 mL Oral TID BM  . folic acid  1 mg Oral Daily  . furosemide  40 mg Oral BID  . hydrALAZINE  25 mg Oral Q8H  . methylPREDNISolone (SOLU-MEDROL) injection  60 mg Intravenous Q12H  . metoCLOPramide (REGLAN) injection  10 mg Intravenous 2 times per day on Sun  . omega-3 acid ethyl esters  1 g Oral BID  . pantoprazole  40 mg Oral Daily  . peg 3350 powder  0.5 kit Oral Once   And  . peg 3350 powder  0.5 kit Oral Once  . saccharomyces boulardii  250 mg Oral BID  . sertraline  50 mg Oral Daily  . sodium chloride flush  3 mL Intravenous Q12H  . traMADol  50 mg Oral BID   Continuous Infusions: . cefTRIAXone (ROCEPHIN)  IV 1 g (01/15/17 1112)    Marzetta Board, MD,  PhD [facility] Pager [phone]  If 7PM-7AM, please contact night-coverage [email or web address] Password [facility] 01/15/2017, 11:47 AM

## 2017-01-15 NOTE — Progress Notes (Signed)
Progress Note   Subjective  Chief Complaint: Anemia, abdominal pain  Patient back on the floor after episode of anaphylaxis from ferriheme yesterday.  Patient reports she is doing well and "feels pretty good".  Patient denies any further episodes of bleeding.  She denies any new complaints.    Objective   Vital signs in last 24 hours: Temp:  [97.5 F (36.4 C)-98.3 F (36.8 C)] 98.1 F (36.7 C) (11/25 0602) Pulse Rate:  [58-62] 62 (11/25 0821) Resp:  [14-15] 15 (11/25 0602) BP: (150-192)/(42-92) 192/65 (11/25 0821) SpO2:  [96 %-100 %] 96 % (11/25 0602) Weight:  [132 lb 0.9 oz (59.9 kg)] 132 lb 0.9 oz (59.9 kg) (11/24 1107) Last BM Date: 01/14/17 General:    Elderly Caucasian female in NAD Heart:  Regular rate and rhythm; no murmurs Lungs: Respirations even and unlabored, lungs CTA bilaterally Abdomen:  Soft, nontender and nondistended. Normal bowel sounds. Extremities:  Without edema. Neurologic:  Alert and oriented,  grossly normal neurologically. Psych:  Cooperative. Normal mood and affect.  Intake/Output from previous day: 11/24 0701 - 11/25 0700 In: 1071.3 [P.O.:300; I.V.:771.3] Out: -  Intake/Output this shift: Total I/O In: 290 [P.O.:240; IV Piggyback:50] Out: -   Lab Results: Recent Labs    01/13/17 0315 01/14/17 0336 01/15/17 0500  WBC 14.5* 19.2* 16.3*  HGB 8.2* 8.7* 8.4*  HCT 24.3* 26.9* 25.7*  PLT 211 270 252   BMET Recent Labs    01/13/17 0315 01/14/17 0336 01/15/17 0500  NA 141 138  138 136  K 4.3 4.3  4.4 4.5  CL 104 103  104 103  CO2 25 23  24 22   GLUCOSE 122* 241*  233* 214*  BUN 55* 59*  59* 65*  CREATININE 2.97* 2.94*  2.99* 2.96*  CALCIUM 7.2* 7.2*  7.2* 7.5*   LFT Recent Labs    01/15/17 0500  ALBUMIN 1.9*      Assessment / Plan:   Assessment: 1.  Abdominal pain: Patient with no complaints of abdominal pain today 2.  History of angiectasia of the small bowel 3.  Melena: Thought related to above, pill capsule  endoscopy pending 4.  Anemia currently stable, likely related to above 5.  Anaphylaxis to feraheme: Patient now improved after steroids, Benadryl and Epinephrine  Plan: 1.  Continue supportive measures with monitoring of hemoglobin and transfusion less than 7 2.  Small Pill capsule endoscopy pending. There were technical difficulties yesterday preventing reading.  This is being read currently. Results expected later today. 3.  Please await any further recommendations from Dr. Silverio Decamp later.   Thank you for your kind consultation, will continue to follow    LOS: 4 days   Levin Erp  01/15/2017, 10:05 AM  Pager # [phone]  Addendum: Spoke with Dr. Silverio Decamp, ulcers visualized throughout the small bowel. Recommending Colonoscopy tomorrow for further evaluation and possible bx for further diagnosis.  Spoke with patient who verbalized understanding. She will be on clears today and NPO after midnight. Prescribed Reglan to be given before prep to help with nausea.  Ellouise Newer, PA-C 01/15/17 1122   Attending physician's note   I have taken an interval history, reviewed the chart and examined the patient. I agree with the Advanced Practitioner's note, impression and recommendations.   Noted multiple small bowel erosions and aphthous ulcers, also 1-2  ulcers in the visualized portion of the right colon.  Patient has had chronic anemia with fecal Hemoccult positive stool suggestive of anemia secondary to  chronic GI blood loss. She had erosions on her last small bowel capsule endoscopy in 2005, but was an incomplete study.  Last colonoscopy in August 2008, patient is due for colorectal cancer screening and will also need to evaluate the etiology for small bowel and colon erosions. ?  NSAID related versus Crohn's disease versus chronic bowel ischemia. Discussed results of capsule endoscopy with patient and she agrees to proceed with colonoscopy.   Damaris Hippo,  MD [phone] Mon-Fri 8a-5p [phone] after 5p, weekends, holidays

## 2017-01-16 ENCOUNTER — Inpatient Hospital Stay (HOSPITAL_COMMUNITY): Payer: Medicare Other | Admitting: Certified Registered Nurse Anesthetist

## 2017-01-16 ENCOUNTER — Encounter (HOSPITAL_COMMUNITY)
Admission: EM | Disposition: A | Discharge status: Skilled Nursing Facility | Payer: Self-pay | Source: Home / Self Care | Attending: Internal Medicine

## 2017-01-16 ENCOUNTER — Encounter (HOSPITAL_COMMUNITY): Payer: Self-pay

## 2017-01-16 DIAGNOSIS — K633 Ulcer of intestine: Secondary | ICD-10-CM

## 2017-01-16 DIAGNOSIS — N179 Acute kidney failure, unspecified: Secondary | ICD-10-CM

## 2017-01-16 DIAGNOSIS — D123 Benign neoplasm of transverse colon: Secondary | ICD-10-CM

## 2017-01-16 DIAGNOSIS — N189 Chronic kidney disease, unspecified: Secondary | ICD-10-CM

## 2017-01-16 DIAGNOSIS — D649 Anemia, unspecified: Secondary | ICD-10-CM

## 2017-01-16 HISTORY — PX: COLONOSCOPY WITH PROPOFOL: SHX5780

## 2017-01-16 LAB — RENAL FUNCTION PANEL
Albumin: 1.8 g/dL — ABNORMAL LOW (ref 3.5–5.0)
Anion gap: 11 (ref 5–15)
BUN: 62 mg/dL — AB (ref 6–20)
CALCIUM: 7.9 mg/dL — AB (ref 8.9–10.3)
CO2: 22 mmol/L (ref 22–32)
CREATININE: 2.87 mg/dL — AB (ref 0.44–1.00)
Chloride: 106 mmol/L (ref 101–111)
GFR calc non Af Amer: 16 mL/min — ABNORMAL LOW (ref >60)
GFR, EST AFRICAN AMERICAN: 19 mL/min — AB (ref >60)
Glucose, Bld: 112 mg/dL — ABNORMAL HIGH (ref 65–99)
Phosphorus: 5.4 mg/dL — ABNORMAL HIGH (ref 2.5–4.6)
Potassium: 3.7 mmol/L (ref 3.5–5.1)
SODIUM: 139 mmol/L (ref 135–145)

## 2017-01-16 LAB — MAGNESIUM: Magnesium: 2.1 mg/dL (ref 1.7–2.4)

## 2017-01-16 LAB — GLUCOSE, CAPILLARY
Glucose-Capillary: 83 mg/dL (ref 65–99)
Glucose-Capillary: 70 mg/dL (ref 65–99)
Glucose-Capillary: 92 mg/dL (ref 65–99)

## 2017-01-16 LAB — CBC
HCT: 25.2 % — ABNORMAL LOW (ref 36.0–46.0)
Hemoglobin: 8.2 g/dL — ABNORMAL LOW (ref 12.0–15.0)
MCH: 29.8 pg (ref 26.0–34.0)
MCHC: 32.5 g/dL (ref 30.0–36.0)
MCV: 91.6 fL (ref 78.0–100.0)
PLATELETS: 230 10*3/uL (ref 150–400)
RBC: 2.75 MIL/uL — AB (ref 3.87–5.11)
RDW: 16 % — AB (ref 11.5–15.5)
WBC: 13.6 10*3/uL — ABNORMAL HIGH (ref 4.0–10.5)

## 2017-01-16 SURGERY — COLONOSCOPY WITH PROPOFOL
Anesthesia: Monitor Anesthesia Care

## 2017-01-16 MED ORDER — PROPOFOL 10 MG/ML IV BOLUS
INTRAVENOUS | Status: AC
  Filled 2017-01-16: qty 60

## 2017-01-16 MED ORDER — HYDRALAZINE HCL 20 MG/ML IJ SOLN: 10.0000 mg | INTRAMUSCULAR | Status: DC | PRN

## 2017-01-16 MED ORDER — PROPOFOL 500 MG/50ML IV EMUL
INTRAVENOUS | Status: DC | PRN
  Administered 2017-01-16: 50 ug/kg/min via INTRAVENOUS

## 2017-01-16 MED ORDER — ONDANSETRON HCL 4 MG/2ML IJ SOLN
INTRAMUSCULAR | Status: AC
Start: 2017-01-16 — End: 2017-01-16
  Filled 2017-01-16: qty 2

## 2017-01-16 MED ORDER — PROPOFOL 10 MG/ML IV BOLUS
INTRAVENOUS | Status: DC | PRN
  Administered 2017-01-16 (×2): 10 mg via INTRAVENOUS

## 2017-01-16 MED ORDER — SODIUM CHLORIDE 0.9 % IV SOLN
INTRAVENOUS | Status: DC | PRN
  Administered 2017-01-16: 11:00:00 via INTRAVENOUS

## 2017-01-16 MED ORDER — LIDOCAINE 2% (20 MG/ML) 5 ML SYRINGE
INTRAMUSCULAR | Status: DC | PRN
  Administered 2017-01-16: 60 mg via INTRAVENOUS

## 2017-01-16 MED ORDER — LIDOCAINE 2% (20 MG/ML) 5 ML SYRINGE
INTRAMUSCULAR | Status: AC
  Filled 2017-01-16: qty 5

## 2017-01-16 MED ORDER — ONDANSETRON HCL 4 MG/2ML IJ SOLN
INTRAMUSCULAR | Status: DC | PRN
  Administered 2017-01-16: 4 mg via INTRAVENOUS

## 2017-01-16 SURGICAL SUPPLY — 22 items

## 2017-01-16 NOTE — Interval H&P Note (Signed)
History and Physical Interval Note:  01/16/2017 10:27 AM  [name]  has presented today for surgery, with the diagnosis of Melena, Anemia  The various methods of treatment have been discussed with the patient and family. After consideration of risks, benefits and other options for treatment, the patient has consented to  Procedure(s): COLONOSCOPY WITH PROPOFOL (N/A) as a surgical intervention .  The patient's history has been reviewed, patient examined, no change in status, stable for surgery.  I have reviewed the patient's chart and labs.  Questions were answered to the patient's satisfaction.     Pricilla Riffle. [place] Plan

## 2017-01-16 NOTE — Op Note (Addendum)
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 Fuller Plan, MD, Jobe Igo, RN, Cletis Athens, Technician Referring MD:             [facility] Medicines:                Monitored Anesthesia Care Complications:            No immediate complications. Estimated blood loss:                            None. Estimated Blood Loss:     Estimated blood loss: none. Procedure:                Pre-Anesthesia Assessment:                           - Prior to the procedure, a History and Physical                            was performed, and patient medications and                            allergies were reviewed. The patient's tolerance of                            previous anesthesia was also reviewed. The risks                            and benefits of the procedure and the sedation                            options and risks were discussed with the patient.                            All questions were answered, and informed consent                            was obtained. Prior Anticoagulants: The patient has                            taken no previous anticoagulant or antiplatelet                            agents.  ASA Grade Assessment: III - A patient with                            severe systemic disease. After reviewing the risks                            and benefits, the patient was deemed in                            satisfactory condition to undergo the procedure.                           After obtaining informed consent, the colonoscope                            was  passed under direct vision. Throughout the                            procedure, the patient's blood pressure, pulse, and                            oxygen saturations were monitored continuously. The                            EC-[id number] ([id number]) scope was introduced through                            the anus and advanced to the the cecum, identified                            by appendiceal orifice and ileocecal valve. The                            terminal ileum, ileocecal valve, appendiceal                            orifice, and rectum were photographed. The quality                            of the bowel preparation was adequate. The                            colonoscopy was performed without difficulty. The                            patient tolerated the procedure well. Scope In: 10:53:52 AM Scope Out: 11:17:14 AM Scope Withdrawal Time: 0 hours 6 minutes 7 seconds  Total Procedure Duration: 0 hours 23 minutes 22 seconds  Findings:      The perianal and digital rectal examinations were normal.      The terminal ileum appeared normal. Biopsies were taken with a cold       forceps for histology due to abnormal CE findings in SB.      A single localized non-bleeding erosion with mild surrounding erythema  was found at the ileocecal valve. No stigmata of recent bleeding were       seen. Biopsies were taken with a cold forceps for histology.      A 6 mm polyp was found in the transverse colon. The polyp was sessile.       The polyp was removed with a cold snare. Resection and retrieval were       complete.      The exam was otherwise without abnormality on direct and retroflexion       views. Random right colon biopsies obtained due to abnormal CE findings       in right colon. Impression:               - The examined portion of the ileum was normal.                            Biopsied.                           - A single erosion at the ileocecal valve. Biopsied.                            - One 6 mm polyp in the transverse colon. Removed                            by cold snare.                           - The examination was otherwise normal on direct                            and retroflexion views. Moderate Sedation:      N/A- Per Anesthesia Care Recommendation:           - Repeat colonoscopy in 5 years for surveillance if                            polyp is precancerous, Dr. Scarlette Shorts.                           - Patient has a contact number available for                            emergencies. The signs and symptoms of potential                            delayed complications were discussed with the                            patient. Return to normal activities tomorrow.                            Written discharge instructions were provided to the                            patient.                           -  Resume previous diet.                           - Continue present medications.                           - Await pathology results.                           - No aspirin, ibuprofen, naproxen, or other                            non-steroidal anti-inflammatory drugs long term. Procedure Code(s):        --- Professional ---                           [phone], Colonoscopy, flexible; with removal of                            tumor(s), polyp(s), or other lesion(s) by snare                            technique                           45380, 60, Colonoscopy, flexible; with biopsy,                            single or multiple Diagnosis Code(s):        --- Professional ---                           K63.3, Ulcer of intestine                           D12.3, Benign neoplasm of transverse colon (hepatic                            flexure or splenic flexure)                           R19.5, Other fecal abnormalities                           D50.0, Iron deficiency anemia secondary to blood                            loss (chronic)                            R93.3, Abnormal findings on diagnostic imaging of                            other parts of digestive tract CPT copyright 2016 [employer]. All rights reserved. The codes documented in this report are preliminary and upon coder review may  be revised to meet current compliance requirements. Ladene Artist, MD 01/16/2017 11:29:44 AM This report has  been signed electronically. Number of Addenda: 0

## 2017-01-16 NOTE — Progress Notes (Signed)
PROGRESS NOTE  [name] IZT:[id number] DOB: 1950-04-30 DOA: 01/10/2017 PCP: Arnoldo Morale, MD   LOS: 5 days   Brief Narrative / Interim history:  66 year old female with history of diabetes, hypertension, hyperlipidemia, stroke, peripheral vascular disease, coronary artery disease, chronic systolic heart failure with an ejection fraction of 25 just 30%, dementia, iron deficiency anemia, who presented with abdominal pain, nausea, dehydration, dehydration, with acute kidney injury.  She was found to have a urinary tract infection.  She had a CT scan of the abdomen and pelvis which did not demonstrate any obstruction secondary to her right kidney stone and there is no other acute change to explain worsening abdominal pain.  She did, however, is expected to have evidence of severe diffuse atherosclerosis with probable stenosis of the mesenteric and celiac arteries which would be consistent with chronic mesenteric ischemia which I think is her primary underlying problem.  She is feeling better with IV fluids and antibiotics.  Her hemoglobin trended down over the last 24 hours.  GI consulted.  Patient had anaphylaxis to feraheme on 11/22 requiring transfer to stepdown, improved and was sent back to floor on 11/24   Subjective:  - Patient in bed, denies any headache, no chest or abdominal pain tonight, no nausea.  No focal weakness.    Assessment & Plan:  Previous multiple GI bleeds with erosive esophagitis, now with streaks of blood in emesis and stools, heme globin trending down, acute on chronic anemia.   -Occult stool positive, on IV PPI, GI on board, capsule endoscopy revealed small bowel ulcers, per GI due for EGD/colonoscopy 01/16/2017.  Continue to monitor H&H.  Further recommendations per GI.   Anaphylaxis and hives secondary to [facility], on 11/22 -Administered epinephrine x2 IM -Discontinue Benadryl 11/23 due to profound somnolence -Solu-Medrol 60 mg IV every 6 hours, changed to every  12 hours on 11/25, still has skin rash, will transition to prednisone  Acute on chronic kidney disease, stage 4, with nongap metabolic acidosis -baseline creatinine is probably around 2.6g/dl and was initially 3.71 g/dl , likely from dehydration, with supportive care dehydration and metabolic acidosis had corrected.  Her home dose of Lasix has been resumed continue to monitor renal function.   Nausea, vomiting, abdominal pain and diarrhea -LFTs stable. Lipase only minimally elevated.   Likely secondary to chronic mesenteric ischemia with a concomitant UTI, continue Rocephin, urine culture growing Klebsiella which is Rocephin sensitive.  She is negative for C. difficile.  For now supportive care for her symptoms which have improved.  To further per GI.  Moderate malnutrition, still not eating much -Appreciate nutrition assistance -Continue regular diet with supplements  Hematuriadue to right renal mass and 74mm stone -F/u with Urology. Dr. Janina Mayo at already scheduled appointment in Dec -Right renal abnormality appears to be resolved on CT -She has some subcentimeter lesions on both kidneys likely representing cysts.  There was a hyper attenuating lesion on the lower pole of the left kidney which is thought to be a hemorrhagic cyst and there is scarring in the midpole of the right kidney.  Diabetes mellitus type 2, A1c 5.6 in July  -Morning CBGs have been on the high side likely due to steroids, continue sliding scale. CBG (last 3)  Recent Labs    01/15/17 1654 01/15/17 2205 01/16/17 0731  GLUCAP 174* 157* 83     Dementia, mild to moderate,needsreorientation, husband gives meds to her, daughter organizes -Continue aricept  Chronic diastolicand systolicheart JASNKNL,ZJ67-34% most recently, -Lasix has been resumed,  currently appears compensated we will continue to monitor.  Hypertension -She was hypotensive during her anaphylactic episode, blood pressure is now  normalized on the high side, resumed Coreg, resumed hydralazine on 11/24, Lasix also on board, IV hydralazine as needed ordered with written parameters.   CAD,not candidate for PCI multivessel disease -HoldingCoregand hydralazine due to hypotension -Continue atorvastatin -Continue aspirin  Peripheral vascular disease status post left common iliac and external iliac artery stents as well as prior CVA on Plavix -on aspirin  COPD  -stable, continue albuterol  Leftgroin abscess -Continue once daily silver dressing with dry dressing changes  Generalized weakness -PT/OT recommending skilled nursing  Iron deficiency anemia, on supplementation but worsening anemia -Iron studies normal -Folate deficiency, start folic acid -TSH 4.580 -Occult stool positive -Repeat hgb in AM -Received Feraheme on 11/22 which resulted in hives and anaphylaxis     DVT prophylaxis: SCD, discontinue heparin with concerns for GI bleed Code Status: DNR Family Communication: no family at bedside Disposition Plan: SNF when ready, to have endoscopy tomorrow per GI  Consultants:   GI  Procedures:   None   Antimicrobials:  None      Objective: Vitals:   01/15/17 1302 01/15/17 2159 01/16/17 0530 01/16/17 1021  BP: (!) 148/58 126/63 (!) 177/49 (!) 191/47  Pulse: 62 (!) 54 (!) 59 (!) 59  Resp: 16 18 18 11   Temp: 98.1 F (36.7 C) 97.8 F (36.6 C) 97.8 F (36.6 C) 98.1 F (36.7 C)  TempSrc: Oral Oral Oral Oral  SpO2: 96% 97% 92% 95%  Weight: 59.7 kg (131 lb 9.8 oz)   59.4 kg (131 lb)  Height:    4\' 11"  (1.499 m)    Intake/Output Summary (Last 24 hours) at 01/16/2017 1106 Last data filed at 01/15/2017 1755 Gross per 24 hour  Intake 250 ml  Output -  Net 250 ml   Filed Weights   01/14/17 1107 01/15/17 1302 01/16/17 1021  Weight: 59.9 kg (132 lb 0.9 oz) 59.7 kg (131 lb 9.8 oz) 59.4 kg (131 lb)    Examination:  Awake Alert, Oriented X 3, No new F.N deficits, Normal  affect [place].AT,PERRAL Supple Neck,No JVD, No cervical lymphadenopathy appriciated.  Symmetrical Chest wall movement, Good air movement bilaterally, CTAB RRR,No Gallops, Rubs or new Murmurs, No Parasternal Heave +ve B.Sounds, Abd Soft, No tenderness, No organomegaly appriciated, No rebound - guarding or rigidity. No Cyanosis, Clubbing or edema, No new Rash or bruise   Data Reviewed: I have independently reviewed following labs and imaging studies   CBC: Recent Labs  Lab 01/10/17 1430  01/12/17 0529 01/13/17 0315 01/14/17 0336 01/15/17 0500 01/16/17 0515  WBC 17.8*   < > 11.7* 14.5* 19.2* 16.3* 13.6*  NEUTROABS 16.5*  --   --   --   --   --   --   HGB 9.7*   < > 7.4* 8.2* 8.7* 8.4* 8.2*  HCT 29.2*   < > 21.8* 24.3* 26.9* 25.7* 25.2*  MCV 87.7   < > 86.9 89.3 90.9 91.8 91.6  PLT 270   < > 198 211 270 252 230   < > = values in this interval not displayed.   Basic Metabolic Panel: Recent Labs  Lab 01/12/17 0529 01/13/17 0315 01/14/17 0336 01/15/17 0500 01/16/17 0515  NA 137 141 138  138 136 139  K 3.6 4.3 4.3  4.4 4.5 3.7  CL 99* 104 103  104 103 106  CO2 29 25 23  24 22  22  GLUCOSE 80 122* 241*  233* 214* 112*  BUN 59* 55* 59*  59* 65* 62*  CREATININE 3.02* 2.97* 2.94*  2.99* 2.96* 2.87*  CALCIUM 6.8* 7.2* 7.2*  7.2* 7.5* 7.9*  MG 1.4* 2.9* 2.5* 2.3 2.1  PHOS 3.9 5.8* 6.2* 6.1* 5.4*   GFR: Estimated Creatinine Clearance: 15.1 mL/min (A) (by C-G formula based on SCr of 2.87 mg/dL (H)). Liver Function Tests: Recent Labs  Lab 01/10/17 1430  01/12/17 0529 01/13/17 0315 01/14/17 0336 01/15/17 0500 01/16/17 0515  AST 20  --   --   --   --   --   --   ALT 16  --   --   --   --   --   --   ALKPHOS 93  --   --   --   --   --   --   BILITOT 0.6  --   --   --   --   --   --   PROT 5.3*  --   --   --   --   --   --   ALBUMIN 2.1*   < > 1.6* 1.8* 1.8* 1.9* 1.8*   < > = values in this interval not displayed.   Recent Labs  Lab 01/10/17 1430  LIPASE 70*   No  results for input(s): AMMONIA in the last 168 hours. Coagulation Profile: No results for input(s): INR, PROTIME in the last 168 hours. Cardiac Enzymes: No results for input(s): CKTOTAL, CKMB, CKMBINDEX, TROPONINI in the last 168 hours. BNP (last 3 results) No results for input(s): PROBNP in the last 8760 hours. HbA1C: No results for input(s): HGBA1C in the last 72 hours. CBG: Recent Labs  Lab 01/11/17 2022 01/15/17 1237 01/15/17 1654 01/15/17 2205 01/16/17 0731  GLUCAP 158* 296* 174* 157* 83   Lipid Profile: No results for input(s): CHOL, HDL, LDLCALC, TRIG, CHOLHDL, LDLDIRECT in the last 72 hours. Thyroid Function Tests: No results for input(s): TSH, T4TOTAL, FREET4, T3FREE, THYROIDAB in the last 72 hours. Anemia Panel: No results for input(s): VITAMINB12, FOLATE, FERRITIN, TIBC, IRON, RETICCTPCT in the last 72 hours. Urine analysis:    Component Value Date/Time   COLORURINE YELLOW 01/11/2017 0545   APPEARANCEUR CLOUDY (A) 01/11/2017 0545   LABSPEC 1.010 01/11/2017 0545   PHURINE 5.0 01/11/2017 0545   GLUCOSEU NEGATIVE 01/11/2017 0545   HGBUR NEGATIVE 01/11/2017 0545   BILIRUBINUR NEGATIVE 01/11/2017 0545   BILIRUBINUR negative 08/11/2015 1513   Blunt 01/11/2017 0545   PROTEINUR NEGATIVE 01/11/2017 0545   UROBILINOGEN 0.2 08/11/2015 1513   UROBILINOGEN 0.2 01/08/2015 1215   NITRITE NEGATIVE 01/11/2017 0545   LEUKOCYTESUR LARGE (A) 01/11/2017 0545   Sepsis Labs: Invalid input(s): PROCALCITONIN, LACTICIDVEN  Recent Results (from the past 240 hour(s))  MRSA PCR Screening     Status: None   Collection Time: 01/11/17  5:40 AM  Result Value Ref Range Status   MRSA by PCR NEGATIVE NEGATIVE Final    Comment:        The GeneXpert MRSA Assay (FDA approved for NASAL specimens only), is one component of a comprehensive MRSA colonization surveillance program. It is not intended to diagnose MRSA infection nor to guide or monitor treatment for MRSA  infections.   Culture, Urine     Status: Abnormal   Collection Time: 01/11/17  5:45 AM  Result Value Ref Range Status   Specimen Description URINE, CLEAN CATCH  Final   Special Requests NONE  Final  Culture >=100,000 COLONIES/mL KLEBSIELLA PNEUMONIAE (A)  Final   Report Status 01/13/2017 FINAL  Final   Organism ID, Bacteria KLEBSIELLA PNEUMONIAE (A)  Final      Susceptibility   Klebsiella pneumoniae - MIC*    AMPICILLIN >=32 RESISTANT Resistant     CEFAZOLIN <=4 SENSITIVE Sensitive     CEFTRIAXONE <=1 SENSITIVE Sensitive     CIPROFLOXACIN <=0.25 SENSITIVE Sensitive     GENTAMICIN <=1 SENSITIVE Sensitive     IMIPENEM <=0.25 SENSITIVE Sensitive     NITROFURANTOIN 64 INTERMEDIATE Intermediate     TRIMETH/SULFA <=20 SENSITIVE Sensitive     AMPICILLIN/SULBACTAM 16 INTERMEDIATE Intermediate     PIP/TAZO 16 SENSITIVE Sensitive     Extended ESBL NEGATIVE Sensitive     * >=100,000 COLONIES/mL KLEBSIELLA PNEUMONIAE  C difficile quick scan w PCR reflex     Status: None   Collection Time: 01/11/17 12:16 PM  Result Value Ref Range Status   C Diff antigen NEGATIVE NEGATIVE Final   C Diff toxin NEGATIVE NEGATIVE Final   C Diff interpretation No C. difficile detected.  Final  Gastrointestinal Panel by PCR , Stool     Status: None   Collection Time: 01/11/17 12:16 PM  Result Value Ref Range Status   Campylobacter species NOT DETECTED NOT DETECTED Final   Plesimonas shigelloides NOT DETECTED NOT DETECTED Final   Salmonella species NOT DETECTED NOT DETECTED Final   Yersinia enterocolitica NOT DETECTED NOT DETECTED Final   Vibrio species NOT DETECTED NOT DETECTED Final   Vibrio cholerae NOT DETECTED NOT DETECTED Final   Enteroaggregative E coli (EAEC) NOT DETECTED NOT DETECTED Final   Enteropathogenic E coli (EPEC) NOT DETECTED NOT DETECTED Final   Enterotoxigenic E coli (ETEC) NOT DETECTED NOT DETECTED Final   Shiga like toxin producing E coli (STEC) NOT DETECTED NOT DETECTED Final    Shigella/Enteroinvasive E coli (EIEC) NOT DETECTED NOT DETECTED Final   Cryptosporidium NOT DETECTED NOT DETECTED Final   Cyclospora cayetanensis NOT DETECTED NOT DETECTED Final   Entamoeba histolytica NOT DETECTED NOT DETECTED Final   Giardia lamblia NOT DETECTED NOT DETECTED Final   Adenovirus F40/41 NOT DETECTED NOT DETECTED Final   Astrovirus NOT DETECTED NOT DETECTED Final   Norovirus GI/GII NOT DETECTED NOT DETECTED Final   Rotavirus A NOT DETECTED NOT DETECTED Final   Sapovirus (I, II, IV, and V) NOT DETECTED NOT DETECTED Final     Radiology Studies: No results found.  Scheduled Meds: . [MAR Hold] amLODipine  5 mg Oral Daily  . [MAR Hold] aspirin EC  81 mg Oral Daily  . [MAR Hold] atorvastatin  80 mg Oral q1800  . [MAR Hold] calcium carbonate  1 tablet Oral TID WC  . [MAR Hold] carvedilol  6.25 mg Oral BID WC  . [MAR Hold] donepezil  10 mg Oral QHS  . [MAR Hold] feeding supplement (GLUCERNA SHAKE)  237 mL Oral TID BM  . [MAR Hold] folic acid  1 mg Oral Daily  . [MAR Hold] furosemide  40 mg Oral BID  . [MAR Hold] hydrALAZINE  50 mg Oral Q8H  . [MAR Hold] insulin aspart  0-9 Units Subcutaneous TID WC  . [MAR Hold] omega-3 acid ethyl esters  1 g Oral BID  . [MAR Hold] pantoprazole  40 mg Oral Daily  . [MAR Hold] predniSONE  20 mg Oral Q breakfast  . [MAR Hold] saccharomyces boulardii  250 mg Oral BID  . [MAR Hold] sertraline  50 mg Oral Daily  . [facility]  Hold] sodium chloride flush  3 mL Intravenous Q12H  . [MAR Hold] traMADol  50 mg Oral BID   Continuous Infusions: . [MAR Hold] cefTRIAXone (ROCEPHIN)  IV Stopped (01/15/17 1232)    Time - 35 minutes   Signature  Lala Lund M.D on 01/16/2017 at 11:07 AM  Between 7am to 7pm - Pager - [phone] ( page via [email or web address], text pages only, please mention full 10 digit call back number).  After 7pm go to [email or web address] - password [facility]   '

## 2017-01-16 NOTE — Care Management Important Message (Signed)
Important Message  Patient Details  Name: [name] MRN: [id number] Date of Birth: March 06, 1950   Medicare Important Message Given:  Yes    Kerin Salen 01/16/2017, 10:56 AMImportant Message  Patient Details  Name: [name] MRN: [id number] Date of Birth: 1950/10/07   Medicare Important Message Given:  Yes    Kerin Salen 01/16/2017, 10:56 AM

## 2017-01-16 NOTE — Transfer of Care (Signed)
Immediate Anesthesia Transfer of Care Note  Patient: [name]  Procedure(s) Performed: COLONOSCOPY WITH PROPOFOL (N/A )  Patient Location: Endoscopy Unit  Anesthesia Type:MAC  Level of Consciousness: drowsy and patient cooperative  Airway & Oxygen Therapy: Patient Spontanous Breathing and Patient connected to face mask  Post-op Assessment: Report given to RN and Post -op Vital signs reviewed and stable  Post vital signs: Reviewed and stable  Last Vitals:  Vitals:   01/16/17 0530 01/16/17 1021  BP: (!) 177/49 (!) 191/47  Pulse: (!) 59 (!) 59  Resp: 18 11  Temp: 36.6 C 36.7 C  SpO2: 92% 95%    Last Pain:  Vitals:   01/16/17 1021  TempSrc: Oral  PainSc:       Patients Stated Pain Goal: 2 (81/85/90 9311)  Complications: No apparent anesthesia complications

## 2017-01-16 NOTE — Anesthesia Postprocedure Evaluation (Signed)
Anesthesia Post Note  Patient: [name]  Procedure(s) Performed: COLONOSCOPY WITH PROPOFOL (N/A )     Patient location during evaluation: Endoscopy Anesthesia Type: MAC Level of consciousness: awake and alert Pain management: pain level controlled Vital Signs Assessment: post-procedure vital signs reviewed and stable Respiratory status: spontaneous breathing, nonlabored ventilation, respiratory function stable and patient connected to nasal cannula oxygen Cardiovascular status: stable and blood pressure returned to baseline Postop Assessment: no apparent nausea or vomiting Anesthetic complications: no    Last Vitals:  Vitals:   01/16/17 1124 01/16/17 1130  BP: (!) 128/58 (!) 154/33  Pulse: (!) 51 (!) 55  Resp: 12 10  Temp: 36.5 C   SpO2: 100% 100%    Last Pain:  Vitals:   01/16/17 1124  TempSrc: Oral  PainSc:                  Cybil Senegal,JAMES TERRILL

## 2017-01-16 NOTE — Progress Notes (Signed)
PT Cancellation Note  Patient Details Name: [name] MRN: [id number] DOB: 29-May-1950   Cancelled Treatment:    Reason Eval/Treat Not Completed: Patient at procedure or test/unavailable   Claretha Cooper 01/16/2017, 10:55 AM Tresa Endo PT [phone]

## 2017-01-16 NOTE — Anesthesia Preprocedure Evaluation (Addendum)
Anesthesia Evaluation  Patient identified by MRN, date of birth, ID band Patient awake    Reviewed: Allergy & Precautions, NPO status , Patient's Chart, lab work & pertinent test results  Airway Mallampati: II  TM Distance: >3 FB Neck ROM: Full    Dental  (+) Poor Dentition, Missing, Dental Advisory Given   Pulmonary COPD, Current Smoker,    breath sounds clear to auscultation       Cardiovascular hypertension, + CAD, + Peripheral Vascular Disease and +CHF   Rhythm:Regular Rate:Normal + Systolic murmurs    Neuro/Psych    GI/Hepatic   Endo/Other  diabetes  Renal/GU Renal disease     Musculoskeletal   Abdominal   Peds  Hematology  (+) anemia ,   Anesthesia Other Findings   Reproductive/Obstetrics                            Anesthesia Physical Anesthesia Plan  ASA: IV  Anesthesia Plan: MAC   Post-op Pain Management:    Induction: Intravenous  PONV Risk Score and Plan: 1 and Treatment may vary due to age or medical condition  Airway Management Planned: Natural Airway and Simple Face Mask  Additional Equipment:   Intra-op Plan:   Post-operative Plan:   Informed Consent: I have reviewed the patients History and Physical, chart, labs and discussed the procedure including the risks, benefits and alternatives for the proposed anesthesia with the patient or authorized representative who has indicated his/her understanding and acceptance.     Plan Discussed with: CRNA  Anesthesia Plan Comments:        Anesthesia Quick Evaluation

## 2017-01-16 NOTE — Plan of Care (Signed)
  Pain Managment: General experience of comfort will improve 01/16/2017 0231 - Progressing by Mickie Kay, RN   Pain Managment: General experience of comfort will improve 01/16/2017 0231 - Progressing by Mickie Kay, RN

## 2017-01-17 ENCOUNTER — Encounter: Payer: Self-pay | Admitting: Gastroenterology

## 2017-01-17 DIAGNOSIS — Z66 Do not resuscitate: Secondary | ICD-10-CM | POA: Diagnosis not present

## 2017-01-17 DIAGNOSIS — I10 Essential (primary) hypertension: Secondary | ICD-10-CM | POA: Diagnosis not present

## 2017-01-17 DIAGNOSIS — I774 Celiac artery compression syndrome: Secondary | ICD-10-CM | POA: Diagnosis not present

## 2017-01-17 DIAGNOSIS — I701 Atherosclerosis of renal artery: Secondary | ICD-10-CM | POA: Diagnosis not present

## 2017-01-17 DIAGNOSIS — E785 Hyperlipidemia, unspecified: Secondary | ICD-10-CM | POA: Diagnosis not present

## 2017-01-17 DIAGNOSIS — M6281 Muscle weakness (generalized): Secondary | ICD-10-CM | POA: Diagnosis not present

## 2017-01-17 DIAGNOSIS — I959 Hypotension, unspecified: Secondary | ICD-10-CM | POA: Diagnosis not present

## 2017-01-17 DIAGNOSIS — N179 Acute kidney failure, unspecified: Secondary | ICD-10-CM | POA: Diagnosis not present

## 2017-01-17 DIAGNOSIS — N184 Chronic kidney disease, stage 4 (severe): Secondary | ICD-10-CM | POA: Diagnosis not present

## 2017-01-17 DIAGNOSIS — R195 Other fecal abnormalities: Secondary | ICD-10-CM | POA: Diagnosis not present

## 2017-01-17 DIAGNOSIS — E1122 Type 2 diabetes mellitus with diabetic chronic kidney disease: Secondary | ICD-10-CM | POA: Diagnosis not present

## 2017-01-17 DIAGNOSIS — N200 Calculus of kidney: Secondary | ICD-10-CM | POA: Diagnosis not present

## 2017-01-17 DIAGNOSIS — K551 Chronic vascular disorders of intestine: Secondary | ICD-10-CM | POA: Diagnosis not present

## 2017-01-17 DIAGNOSIS — E119 Type 2 diabetes mellitus without complications: Secondary | ICD-10-CM | POA: Diagnosis not present

## 2017-01-17 DIAGNOSIS — I739 Peripheral vascular disease, unspecified: Secondary | ICD-10-CM | POA: Diagnosis not present

## 2017-01-17 DIAGNOSIS — K633 Ulcer of intestine: Secondary | ICD-10-CM | POA: Diagnosis not present

## 2017-01-17 DIAGNOSIS — E872 Acidosis: Secondary | ICD-10-CM | POA: Diagnosis not present

## 2017-01-17 DIAGNOSIS — N2889 Other specified disorders of kidney and ureter: Secondary | ICD-10-CM | POA: Diagnosis not present

## 2017-01-17 DIAGNOSIS — N289 Disorder of kidney and ureter, unspecified: Secondary | ICD-10-CM | POA: Diagnosis not present

## 2017-01-17 DIAGNOSIS — E46 Unspecified protein-calorie malnutrition: Secondary | ICD-10-CM | POA: Diagnosis not present

## 2017-01-17 DIAGNOSIS — F0390 Unspecified dementia without behavioral disturbance: Secondary | ICD-10-CM | POA: Diagnosis present

## 2017-01-17 DIAGNOSIS — D500 Iron deficiency anemia secondary to blood loss (chronic): Secondary | ICD-10-CM | POA: Diagnosis not present

## 2017-01-17 DIAGNOSIS — L02214 Cutaneous abscess of groin: Secondary | ICD-10-CM | POA: Diagnosis not present

## 2017-01-17 DIAGNOSIS — I7090 Unspecified atherosclerosis: Secondary | ICD-10-CM | POA: Diagnosis not present

## 2017-01-17 DIAGNOSIS — R310 Gross hematuria: Secondary | ICD-10-CM | POA: Diagnosis not present

## 2017-01-17 DIAGNOSIS — I6523 Occlusion and stenosis of bilateral carotid arteries: Secondary | ICD-10-CM | POA: Diagnosis not present

## 2017-01-17 DIAGNOSIS — I300 Acute nonspecific idiopathic pericarditis: Secondary | ICD-10-CM | POA: Diagnosis not present

## 2017-01-17 DIAGNOSIS — M79604 Pain in right leg: Secondary | ICD-10-CM | POA: Diagnosis not present

## 2017-01-17 DIAGNOSIS — D638 Anemia in other chronic diseases classified elsewhere: Secondary | ICD-10-CM | POA: Diagnosis not present

## 2017-01-17 DIAGNOSIS — E11319 Type 2 diabetes mellitus with unspecified diabetic retinopathy without macular edema: Secondary | ICD-10-CM | POA: Diagnosis not present

## 2017-01-17 DIAGNOSIS — D508 Other iron deficiency anemias: Secondary | ICD-10-CM | POA: Diagnosis not present

## 2017-01-17 DIAGNOSIS — I5042 Chronic combined systolic (congestive) and diastolic (congestive) heart failure: Secondary | ICD-10-CM | POA: Diagnosis not present

## 2017-01-17 DIAGNOSIS — I2510 Atherosclerotic heart disease of native coronary artery without angina pectoris: Secondary | ICD-10-CM | POA: Diagnosis not present

## 2017-01-17 DIAGNOSIS — D509 Iron deficiency anemia, unspecified: Secondary | ICD-10-CM | POA: Diagnosis not present

## 2017-01-17 DIAGNOSIS — J189 Pneumonia, unspecified organism: Secondary | ICD-10-CM | POA: Diagnosis not present

## 2017-01-17 DIAGNOSIS — I272 Pulmonary hypertension, unspecified: Secondary | ICD-10-CM | POA: Diagnosis not present

## 2017-01-17 DIAGNOSIS — I130 Hypertensive heart and chronic kidney disease with heart failure and stage 1 through stage 4 chronic kidney disease, or unspecified chronic kidney disease: Secondary | ICD-10-CM | POA: Diagnosis not present

## 2017-01-17 DIAGNOSIS — E440 Moderate protein-calorie malnutrition: Secondary | ICD-10-CM | POA: Diagnosis not present

## 2017-01-17 DIAGNOSIS — R488 Other symbolic dysfunctions: Secondary | ICD-10-CM | POA: Diagnosis not present

## 2017-01-17 DIAGNOSIS — Z8719 Personal history of other diseases of the digestive system: Secondary | ICD-10-CM | POA: Diagnosis not present

## 2017-01-17 DIAGNOSIS — I252 Old myocardial infarction: Secondary | ICD-10-CM | POA: Diagnosis not present

## 2017-01-17 DIAGNOSIS — R600 Localized edema: Secondary | ICD-10-CM | POA: Diagnosis not present

## 2017-01-17 DIAGNOSIS — J449 Chronic obstructive pulmonary disease, unspecified: Secondary | ICD-10-CM | POA: Diagnosis not present

## 2017-01-17 DIAGNOSIS — I5032 Chronic diastolic (congestive) heart failure: Secondary | ICD-10-CM | POA: Diagnosis not present

## 2017-01-17 DIAGNOSIS — R509 Fever, unspecified: Secondary | ICD-10-CM | POA: Diagnosis not present

## 2017-01-17 DIAGNOSIS — F419 Anxiety disorder, unspecified: Secondary | ICD-10-CM | POA: Diagnosis present

## 2017-01-17 DIAGNOSIS — R531 Weakness: Secondary | ICD-10-CM | POA: Diagnosis not present

## 2017-01-17 DIAGNOSIS — D649 Anemia, unspecified: Secondary | ICD-10-CM | POA: Diagnosis not present

## 2017-01-17 DIAGNOSIS — E1151 Type 2 diabetes mellitus with diabetic peripheral angiopathy without gangrene: Secondary | ICD-10-CM | POA: Diagnosis not present

## 2017-01-17 DIAGNOSIS — N189 Chronic kidney disease, unspecified: Secondary | ICD-10-CM | POA: Diagnosis not present

## 2017-01-17 LAB — RENAL FUNCTION PANEL
ALBUMIN: 1.7 g/dL — AB (ref 3.5–5.0)
Anion gap: 10 (ref 5–15)
BUN: 58 mg/dL — AB (ref 6–20)
CALCIUM: 8.1 mg/dL — AB (ref 8.9–10.3)
CO2: 22 mmol/L (ref 22–32)
Chloride: 108 mmol/L (ref 101–111)
Creatinine, Ser: 2.91 mg/dL — ABNORMAL HIGH (ref 0.44–1.00)
GFR calc Af Amer: 18 mL/min — ABNORMAL LOW (ref >60)
GFR, EST NON AFRICAN AMERICAN: 16 mL/min — AB (ref >60)
GLUCOSE: 77 mg/dL (ref 65–99)
PHOSPHORUS: 5 mg/dL — AB (ref 2.5–4.6)
Potassium: 3.6 mmol/L (ref 3.5–5.1)
SODIUM: 140 mmol/L (ref 135–145)

## 2017-01-17 LAB — CBC
HCT: 24.6 % — ABNORMAL LOW (ref 36.0–46.0)
Hemoglobin: 7.9 g/dL — ABNORMAL LOW (ref 12.0–15.0)
MCH: 30 pg (ref 26.0–34.0)
MCHC: 32.1 g/dL (ref 30.0–36.0)
MCV: 93.5 fL (ref 78.0–100.0)
Platelets: 207 10*3/uL (ref 150–400)
RBC: 2.63 MIL/uL — ABNORMAL LOW (ref 3.87–5.11)
RDW: 16.2 % — ABNORMAL HIGH (ref 11.5–15.5)
WBC: 13.6 10*3/uL — AB (ref 4.0–10.5)

## 2017-01-17 LAB — MAGNESIUM: Magnesium: 2 mg/dL (ref 1.7–2.4)

## 2017-01-17 LAB — GLUCOSE, CAPILLARY
Glucose-Capillary: 73 mg/dL (ref 65–99)
Glucose-Capillary: 132 mg/dL — ABNORMAL HIGH (ref 65–99)
Glucose-Capillary: 151 mg/dL — ABNORMAL HIGH (ref 65–99)

## 2017-01-17 MED ORDER — FOLIC ACID 1 MG PO TABS
1.0000 mg | ORAL_TABLET | Freq: Every day | ORAL | 0 refills | Status: AC
Start: 2017-01-18 — End: ?

## 2017-01-17 NOTE — Discharge Instructions (Signed)
Follow with Primary MD Arnoldo Morale, MD in 7 days   Get CBC, CMP, checked  by Primary MD in 5-7 days    Activity: As tolerated with Full fall precautions use walker/cane & assistance as needed  Disposition Home    Diet:  Heart Healthy    For Heart failure patients - Check your Weight same time everyday, if you gain over 2 pounds, or you develop in leg swelling, experience more shortness of breath or chest pain, call your Primary MD immediately. Follow Cardiac Low Salt Diet and 1.5 lit/day fluid restriction.  On your next visit with your primary care physician please Get Medicines reviewed and adjusted.  Please request your Prim.MD to go over all Hospital Tests and Procedure/Radiological results at the follow up, please get all Hospital records sent to your Prim MD by signing hospital release before you go home.  If you experience worsening of your admission symptoms, develop shortness of breath, life threatening emergency, suicidal or homicidal thoughts you must seek medical attention immediately by calling 911 or calling your MD immediately  if symptoms less severe.  You Must read complete instructions/literature along with all the possible adverse reactions/side effects for all the Medicines you take and that have been prescribed to you. Take any new Medicines after you have completely understood and accpet all the possible adverse reactions/side effects.   Do not drive, operate heavy machinery, perform activities at heights, swimming or participation in water activities or provide baby sitting services if your were admitted for syncope or siezures until you have seen by Primary MD or a Neurologist and advised to do so again.  Do not drive when taking Pain medications.    Do not take more than prescribed Pain, Sleep and Anxiety Medications  Special Instructions: If you have smoked or chewed Tobacco  in the last 2 yrs please stop smoking, stop any regular Alcohol  and or any Recreational  drug use.  Wear Seat belts while driving.   Please note  You were cared for by a hospitalist during your hospital stay. If you have any questions about your discharge medications or the care you received while you were in the hospital after you are discharged, you can call the unit and asked to speak with the hospitalist on call if the hospitalist that took care of you is not available. Once you are discharged, your primary care physician will handle any further medical issues. Please note that NO REFILLS for any discharge medications will be authorized once you are discharged, as it is imperative that you return to your primary care physician (or establish a relationship with a primary care physician if you do not have one) for your aftercare needs so that they can reassess your need for medications and monitor your lab values.

## 2017-01-17 NOTE — Progress Notes (Signed)
Patient will return to [facility].   LCSW notified patients daughter Lavella Lemons by phone and husband at bedside.   Patient will transport by [facility]. LCSW called for transport at 2:33pm  LCSW confirmed bed with facility.  LCSW faxed all dc documents via hub.  RN notified of report number [id number]

## 2017-01-17 NOTE — Progress Notes (Signed)
[id number] Cortnee Steinmiller,BSN,RN3,CCM: [facility] notified of [facility] needs.

## 2017-01-17 NOTE — Progress Notes (Signed)
Physical Therapy Treatment Patient Details Name: [name] MRN: [id number] DOB: 1951-01-11 Today's Date: 01/17/2017    History of Present Illness 66 y.o. female with history of diabetes, hypertension, hyperlipidemia, CVA, peripheral vascular disease, multivessel CAD, chronic systolic heart failure, dementia, iron deficiency anemia, possible right kidney mass and right renal pelvis nephrolith who was admitted from 11/6 to 11/8 due to a left groin abscess and intertrigo.  Pt admitted from SNF for Acute on chronic kidney disease, stage 4, with nongap metabolic acidosis    PT Comments    Patient able to stand and pivot to [facility] with min guard for safety. Required 50% VC's for hand placement during sit to stand. Ambulated 20 feet down hallway with c/o fatigue, required a short rest and continued another 20 feet. Patient pushed back to room in recliner. Positioned with pillows for pressure relief.    Follow Up Recommendations  SNF;Supervision/Assistance - 24 hour     Equipment Recommendations  None recommended by PT    Recommendations for Other Services       Precautions / Restrictions Precautions Precautions: Fall Restrictions Weight Bearing Restrictions: No    Mobility  Bed Mobility Overal bed mobility: Needs Assistance Bed Mobility: Supine to Sit     Supine to sit: Supervision;HOB elevated;Min guard     General bed mobility comments: required increased time, min guard for safety  Transfers Overall transfer level: Needs assistance Equipment used: Rolling walker (2 wheeled) Transfers: Stand Pivot Transfers;Sit to/from Stand Sit to Stand: Min guard;Supervision Stand pivot transfers: Min guard;Supervision       General transfer comment: min guard for safety, pivoted to [facility]. 50% VC's for hand placement  Ambulation/Gait Ambulation/Gait assistance: Min guard;Supervision Ambulation Distance (Feet): 40 Feet Assistive device: Rolling walker (2 wheeled) Gait  Pattern/deviations: Step-through pattern;Decreased stride length;Trunk flexed Gait velocity: decreased Gait velocity interpretation: Below normal speed for age/gender General Gait Details: required a short rest break after 20 feet due to fatigue. HR increased to 79 during ambulation.    Stairs            Wheelchair Mobility    Modified Rankin (Stroke Patients Only)       Balance                                            Cognition Arousal/Alertness: Awake/alert Behavior During Therapy: WFL for tasks assessed/performed Overall Cognitive Status: History of cognitive impairments - at baseline                                 General Comments: dementia at baseline      Exercises      General Comments        Pertinent Vitals/Pain Pain Assessment: No/denies pain    Home Living                      Prior Function            PT Goals (current goals can now be found in the care plan section)      Frequency    Min 2X/week      PT Plan      Co-evaluation              AM-PAC PT "6 Clicks" Daily Activity  Outcome Measure  Difficulty  turning over in bed (including adjusting bedclothes, sheets and blankets)?: A Little Difficulty moving from lying on back to sitting on the side of the bed? : A Little Difficulty sitting down on and standing up from a chair with arms (e.g., wheelchair, bedside commode, etc,.)?: A Little Help needed moving to and from a bed to chair (including a wheelchair)?: A Little Help needed walking in hospital room?: A Lot Help needed climbing 3-5 steps with a railing? : A Lot 6 Click Score: 16    End of Session Equipment Utilized During Treatment: Gait belt Activity Tolerance: Patient limited by fatigue Patient left: in chair;with call bell/phone within reach;with family/visitor present Nurse Communication: Mobility status PT Visit Diagnosis: Other abnormalities of gait and mobility  (R26.89)     Time: 8828-0034 PT Time Calculation (min) (ACUTE ONLY): 20 min  Charges:  $Gait Training: 8-22 mins                    G Codes:      Almond Lint, SPTA [facility] [place]  PTA [facility]  Acute  Rehab Pager      [phone]

## 2017-01-17 NOTE — Discharge Summary (Signed)
[name] UXN:[id number] DOB: 1950-08-26 DOA: 01/10/2017  PCP: Arnoldo Morale, MD  Admit date: 01/10/2017  Discharge date: 01/17/2017  Admitted From: Home   Disposition:  Home   Recommendations for Outpatient Follow-up:   Follow up with PCP in 1-2 weeks  PCP Please obtain BMP/CBC, 2 view CXR in 1week,  (see Discharge instructions)   PCP Please follow up on the following pending results: Monitor CBC and Anemia panel closely   Home Health: PT, RN, Aide   Equipment/Devices: none Consultations: GI Discharge Condition: Stable CODE STATUS: Full  Diet Recommendation:  Heart Healthy    No chief complaint on file.    Brief history of present illness from the day of admission and additional interim summary     66 year old female with history of diabetes, hypertension, hyperlipidemia, stroke, peripheral vascular disease, coronary artery disease, chronic systolic heart failure with an ejection fraction of 25 just 30%, dementia, iron deficiency anemia, who presented with abdominal pain, nausea, dehydration, dehydration, with acute kidney injury. She was found to have a urinary tract infection. She had a CT scan of the abdomen and pelvis which did not demonstrate any obstruction secondary to her right kidney stone and there is no other acute change to explain worsening abdominal pain. She did, however, is expected to have evidence of severe diffuse atherosclerosis with probable stenosis of the mesenteric and celiac arteries which would be consistent with chronic mesenteric ischemia which I think is her primary underlying problem. She is feeling better with IV fluids and antibiotics. Her hemoglobin trended down over the last 24 hours. GI consulted. Patient had anaphylaxis to feraheme on 11/22 requiring transfer to  stepdown, improved and was sent back to floor on 11/24.                                                                   Hospital Course    Previous multiple GI bleeds with erosive esophagitis, now with streaks of blood in emesis and stools, heme globin trending down, acute on chronic anemia.  Occult +ve stool, Capsule endo -  revealed multiple small bowel erosions, aphthous ulcers and 1-2 ulcers in visualized portion of right colon. Colonoscopy  - terminal ileum appeared normal but biopsies were taken due to the abnormal capsule findings.  A single localized nonbleeding erosion with mild surrounding erythema was found at the ileocecal valve.  A 6 mm sessile polyp was removed from the transverse colon. Now stable, symptom free, DC on PO Iron, refrain from NSAID use, outpt GI follow up.   Nausea, vomiting, abdominal pain and diarrhea -resolved due to above.  Anaphylaxis and hives secondary to [facility], on 11/22 -resolved after Benadryl and Steroids. Acute on chronic kidney disease, stage 4, with nongap metabolic acidosis -baseline creatinine is probably around 2.6g/dl and was  initially 3.71 g/dl, likely from dehydration,resolved.   Moderate malnutrition, still not eating much -encouraged to eat healthy  Hematuriadue to right renal mass and 45mm stone -F/u with Urology. Dr. Janina Mayo at already scheduled appointment in Dec -Right renal abnormality appears to be resolved on CT -She has some subcentimeter lesions on both kidneys likely representing cysts. There was a hyper attenuating lesion on the lower pole of the left kidney which is thought to be a hemorrhagic cyst and there is scarring in the midpole of the right kidney.  Diabetes mellitus type 2, A1c 5.6 in July - stable, no changes  Dementia, mild to moderate,needsreorientation, husband gives meds to her, daughter organizes -Continue aricept  Chronic diastolicand systolicheart SWFUXNA,TF57-32% most  recently, -Lasix has been resumed, currently appears compensated we will continue to monitor.  Hypertension -on Home regimen.   CAD,not candidate for PCI multivessel disease -HoldingCoregand hydralazine due to hypotension -Continue atorvastatin -Continue aspirin  Peripheral vascular disease status post left common iliac and external iliac artery stents as well as prior CVA on Plavix -on aspirin and statin continue.  COPD  -stable, continue albuterol  Leftgroin abscess -Continue once daily silver dressing with dry dressing changes, HRN.  Generalized weakness  -PT/OT recommending skilled nursing, but pt refused, HHPT.  Iron deficiency anemia, on supplementation but worsening anemia -Iron studies normal, -Folate deficiency, started on folic acid, likely mild intermittent occult GI blood loss from, S.Bowel apathaous ulcers and erosion, stable TSH, had anaphylaxis with Feraheme, is on PO ferrous sulphate and will continue along with Folic acid. PCP to monitor Iron levels and CBC.    Discharge diagnosis     Principal Problem:   Acute kidney injury superimposed on chronic kidney disease (HCC) Active Problems:   Chronic obstructive pulmonary disease (HCC)   Renal artery stenosis (HCC)   Diet-controlled diabetes mellitus (HCC)   Iron deficiency anemia   Stage 4 chronic kidney disease (HCC)   CAD (coronary artery disease)   Chronic diastolic CHF (congestive heart failure) (HCC)   Protein-calorie malnutrition (HCC)   AKI (acute kidney injury) ([place])   Dementia   Occult blood in stools   Anemia   Abscess of groin   Anaphylaxis   Benign neoplasm of transverse colon    Discharge instructions    Discharge Instructions    Diet - low sodium heart healthy   Complete by:  As directed    Discharge instructions   Complete by:  As directed    Follow with Primary MD Arnoldo Morale, MD in 7 days   Get CBC, CMP, checked  by Primary MD in 5-7 days    Activity: As tolerated  with Full fall precautions use walker/cane & assistance as needed  Disposition Home    Diet:  Heart Healthy    For Heart failure patients - Check your Weight same time everyday, if you gain over 2 pounds, or you develop in leg swelling, experience more shortness of breath or chest pain, call your Primary MD immediately. Follow Cardiac Low Salt Diet and 1.5 lit/day fluid restriction.  On your next visit with your primary care physician please Get Medicines reviewed and adjusted.  Please request your Prim.MD to go over all Hospital Tests and Procedure/Radiological results at the follow up, please get all Hospital records sent to your Prim MD by signing hospital release before you go home.  If you experience worsening of your admission symptoms, develop shortness of breath, life threatening emergency, suicidal or homicidal thoughts you must seek medical attention  immediately by calling 911 or calling your MD immediately  if symptoms less severe.  You Must read complete instructions/literature along with all the possible adverse reactions/side effects for all the Medicines you take and that have been prescribed to you. Take any new Medicines after you have completely understood and accpet all the possible adverse reactions/side effects.   Do not drive, operate heavy machinery, perform activities at heights, swimming or participation in water activities or provide baby sitting services if your were admitted for syncope or siezures until you have seen by Primary MD or a Neurologist and advised to do so again.  Do not drive when taking Pain medications.    Do not take more than prescribed Pain, Sleep and Anxiety Medications  Special Instructions: If you have smoked or chewed Tobacco  in the last 2 yrs please stop smoking, stop any regular Alcohol  and or any Recreational drug use.  Wear Seat belts while driving.   Please note  You were cared for by a hospitalist during your hospital stay. If  you have any questions about your discharge medications or the care you received while you were in the hospital after you are discharged, you can call the unit and asked to speak with the hospitalist on call if the hospitalist that took care of you is not available. Once you are discharged, your primary care physician will handle any further medical issues. Please note that NO REFILLS for any discharge medications will be authorized once you are discharged, as it is imperative that you return to your primary care physician (or establish a relationship with a primary care physician if you do not have one) for your aftercare needs so that they can reassess your need for medications and monitor your lab values.   Increase activity slowly   Complete by:  As directed       Discharge Medications   Allergies as of 01/17/2017      Reactions   Feraheme [ferumoxytol] Anaphylaxis   hives      Medication List    TAKE these medications   acetaminophen 325 MG tablet Commonly known as:  TYLENOL Take 325 mg by mouth every 6 (six) hours as needed.   albuterol 108 (90 Base) MCG/ACT inhaler Commonly known as:  PROVENTIL HFA;VENTOLIN HFA Inhale 2 puffs into the lungs every 6 (six) hours as needed for wheezing or shortness of breath.   aspirin EC 81 MG tablet Take 81 mg by mouth daily.   atorvastatin 80 MG tablet Commonly known as:  LIPITOR TAKE 1 TABLET BY MOUTH  DAILY AT 6 PM.   carvedilol 6.25 MG tablet Commonly known as:  COREG TAKE 1 TABLET BY MOUTH TWO  TIMES DAILY WITH A MEAL   donepezil 10 MG tablet Commonly known as:  ARICEPT Take 1 tablet (10 mg total) by mouth at bedtime.   ferrous sulfate 325 (65 FE) MG tablet Take 1 tablet (325 mg total) by mouth 2 (two) times daily with a meal.   fish oil-omega-3 fatty acids 1000 MG capsule Take 1 g by mouth 3 (three) times daily. Reported on 04/09/2015   furosemide 40 MG tablet Commonly known as:  LASIX Take 40 mg by mouth 2 (two) times  daily.   hydrALAZINE 50 MG tablet Commonly known as:  APRESOLINE TAKE 1 TABLET BY MOUTH 3  TIMES DAILY   nitroGLYCERIN 0.4 MG SL tablet Commonly known as:  NITROSTAT Place 0.4 mg under the tongue every 5 (five) minutes as needed  for chest pain. X 3 doses   NUTRITIONAL SUPPLEMENT Liqd Take 120 mLs 2 (two) times daily by mouth. MedPass   pantoprazole 40 MG tablet Commonly known as:  PROTONIX TAKE 1 TABLET BY MOUTH  DAILY   potassium chloride 10 MEQ tablet Commonly known as:  K-DUR Take 10 mEq daily by mouth.   promethazine 25 MG tablet Commonly known as:  PHENERGAN Take 25 mg by mouth every 6 (six) hours as needed for nausea or vomiting. x3 doses   saccharomyces boulardii 250 MG capsule Commonly known as:  FLORASTOR Take 250 mg 2 (two) times daily by mouth.   sertraline 50 MG tablet Commonly known as:  ZOLOFT Take 1 tablet (50 mg total) by mouth daily.   traMADol 50 MG tablet Commonly known as:  ULTRAM Take 50 mg by mouth 2 (two) times daily.       Follow-up Information    Willia Craze, NP Follow up on 02/01/2017.   Specialty:  Gastroenterology Why:  at 10:00 am, arrive 10 minutes early for appointment Contact information: [place] [address] [phone]        Arnoldo Morale, MD. Schedule an appointment as soon as possible for a visit in 1 week(s).   Specialty:  Family Medicine Contact information: [address] [place] [address] [phone]           Major procedures and Radiology Reports - PLEASE review detailed and final reports thoroughly  -      Capsule endo -  revealed multiple small bowel erosions, aphthous ulcers and 1-2 ulcers in visualized portion of right colon.   Colonoscopy  - terminal ileum appeared normal but biopsies were taken due to the abnormal capsule findings.  A single localized nonbleeding erosion with mild surrounding erythema was found at the ileocecal valve.  A 6 mm sessile polyp was  removed from the transverse colon.    Ct Abdomen Pelvis Wo Contrast  Result Date: 01/10/2017 CLINICAL DATA:  Worsening kidney function, increased white cell count, and epigastric abdominal pain. EXAM: CT ABDOMEN AND PELVIS WITHOUT CONTRAST TECHNIQUE: Multidetector CT imaging of the abdomen and pelvis was performed following the standard protocol without IV contrast. COMPARISON:  12/20/2016 FINDINGS: Lower chest: 7 x 12 mm nodule in the right lung base anteriorly without change since previous study. The nodule was present without change on previous CT chest from 05/12/2015. Continued follow-up until 2 year stability can be demonstrated is warranted. Slight fibrosis in the lung bases. Calcified granuloma in the right lung base. Hepatobiliary: Surgical absence of the gallbladder. No bile duct dilatation. Vascular calcifications in the hepatic hilum. Pancreas: Unremarkable. No pancreatic ductal dilatation or surrounding inflammatory changes. Spleen: Normal in size without focal abnormality. Adrenals/Urinary Tract: No adrenal gland nodules. Prominent vascular calcifications in the renal artery's hand renal hilar vessels. No hydronephrosis or hydroureter. Subcentimeter lesions on both kidneys likely representing cysts. Hyperattenuating lesion on the lower pole left kidney is probably a hemorrhagic cyst. No change since previous study. Focal scarring in the midpole right kidney. Bladder is mostly decompressed. Mild diffuse bladder wall thickening may indicate cystitis. Correlation with urinalysis is recommended. No filling defects demonstrated. Stomach/Bowel: The stomach, small bowel, and colon are not abnormally distended. No wall thickening or inflammatory changes are appreciated. Under distention limits evaluation. Contrast material and stool throughout the colon. No inflammatory changes. Appendix is not identified. Vascular/Lymphatic: Extensive vascular calcifications throughout the abdominal aorta and major  branch vessels. There is evidence of calcific  stenosis of the lower aorta and extending into both iliac arteries. Likely there areas of high-grade stenosis in the iliac and external iliac arteries bilaterally. Prominent calcification in the mesenteric arteries and celiac axis artery's likely also results in significant stenosis. Reproductive: Uterus and ovaries are not enlarged. Other: No free air or free fluid in the abdomen. Abdominal wall musculature appears intact. Musculoskeletal: Degenerative changes in the spine. No destructive bone lesions. IMPRESSION: 1. Extensive aortic atherosclerosis and extensive calcific atherosclerosis throughout the abdominal vasculature. Likely there are areas of significant stenosis in multiple vessels. 2. No evidence of bowel obstruction or inflammation. 3. Subcentimeter lesions in both kidneys likely represent cysts although too small to characterize. Hemorrhagic cyst on the left kidney. 4. Unchanged appearance of a 7 x 12 mm nodule in the right lung base. Six-month follow-up study recommended to demonstrate continued stability. 5. Bladder wall thickening may indicate cystitis. Correlation with urinalysis recommended. Electronically Signed   By: Lucienne Capers M.D.   On: 01/10/2017 21:22   Ct Renal Stone Study  Result Date: 12/20/2016 CLINICAL DATA:  66 year old female with hematuria. EXAM: CT ABDOMEN AND PELVIS WITHOUT CONTRAST TECHNIQUE: Multidetector CT imaging of the abdomen and pelvis was performed following the standard protocol without IV contrast. COMPARISON:  Renal ultrasound dated 01/05/2016 and CT dated 09/07/2012 the FINDINGS: Evaluation of this exam is limited in the absence of intravenous contrast. Lower chest: There is a 10 x 8 mm subpleural nodule at the right lung base (series 4, image 9). A 3 mm nodule in the lateral right lower lobe (series 4, image 4) may represent a calcified granuloma. There is coronary vascular calcification. There is no  intra-abdominal free air. Small free fluid within the pelvis. Hepatobiliary: The liver is unremarkable. There is cholecystectomy changes. Pancreas: Unremarkable. No pancreatic ductal dilatation or surrounding inflammatory changes. Spleen: Normal in size without focal abnormality. Adrenals/Urinary Tract: The adrenal glands are unremarkable. There is an extrarenal pelvis on the right with mild pelviectasis. High attenuating content within the right renal collecting system and pelvis most consistent with blood product. This is of uncertain etiology but concerning for underlying neoplasm. Evaluation is very limited on this noncontrast CT. Further evaluation with CT urography or direct visualization with ureteroscopy recommended. There is no hydronephrosis or nephrolithiasis. There is mild right perinephric fluid. A 7 mm stone in the right pelvis (series 2, image 66) appears to be adjacent to the ureter and not in the right ureter. There is a 15 mm hypodense lesion in the upper pole of the left kidney which is not well characterized but likely represents a cyst. A subcentimeter high attenuating lesion in the inferior pole of the left kidney is also not characterized but may represent a complex/hemorrhagic cyst. There is no hydronephrosis or nephrolithiasis on the left. The left ureter is unremarkable. Stomach/Bowel: There is no evidence of bowel obstruction or active inflammation. Appendectomy. Vascular/Lymphatic: There is advanced aortoiliac atherosclerotic disease. There is extensive atherosclerotic calcification of the mesenteric vasculature as well as bilateral renovascular calcification. The IVC is grossly unremarkable. No portal venous gas. There is no adenopathy. Reproductive: The uterus is retroflexed. The ovaries are grossly unremarkable. Other: There is diffuse subcutaneous edema and stranding. No fluid collection. Musculoskeletal: Osteopenia with degenerative changes of the spine. Grade 1 L4-L5  anterolisthesis. IMPRESSION: 1. Mild caliectasis of the right renal pelvis containing blood product. This is of indeterminate etiology but underlying mass is not excluded. Further evaluation with CT urography or direct visualization with ureteroscopy recommended. There is  no hydronephrosis or nephrolithiasis on either side. 2. **An incidental finding of potential clinical significance has been found. A 10 mm right lung base subpleural nodule. Consider one of the following in 3 months for both low-risk and high-risk individuals: (a) repeat chest CT, (b) follow-up PET-CT, or (c) tissue sampling. This recommendation follows the consensus statement: Guidelines for Management of Incidental Pulmonary Nodules Detected on CT Images: From the Fleischner Society 2017; Radiology 2017; 284:228-243.** 3. Advanced atherosclerotic disease of the aorta Aortic Atherosclerosis (ICD10-I70.0). Atherosclerotic disease of the mesenteric vasculature as well as renovascular calcification. 4. No bowel obstruction or active inflammation. Electronically Signed   By: Anner Crete M.D.   On: 12/20/2016 20:24    Micro Results     Recent Results (from the past 240 hour(s))  MRSA PCR Screening     Status: None   Collection Time: 01/11/17  5:40 AM  Result Value Ref Range Status   MRSA by PCR NEGATIVE NEGATIVE Final    Comment:        The GeneXpert MRSA Assay (FDA approved for NASAL specimens only), is one component of a comprehensive MRSA colonization surveillance program. It is not intended to diagnose MRSA infection nor to guide or monitor treatment for MRSA infections.   Culture, Urine     Status: Abnormal   Collection Time: 01/11/17  5:45 AM  Result Value Ref Range Status   Specimen Description URINE, CLEAN CATCH  Final   Special Requests NONE  Final   Culture >=100,000 COLONIES/mL KLEBSIELLA PNEUMONIAE (A)  Final   Report Status 01/13/2017 FINAL  Final   Organism ID, Bacteria KLEBSIELLA PNEUMONIAE (A)  Final       Susceptibility   Klebsiella pneumoniae - MIC*    AMPICILLIN >=32 RESISTANT Resistant     CEFAZOLIN <=4 SENSITIVE Sensitive     CEFTRIAXONE <=1 SENSITIVE Sensitive     CIPROFLOXACIN <=0.25 SENSITIVE Sensitive     GENTAMICIN <=1 SENSITIVE Sensitive     IMIPENEM <=0.25 SENSITIVE Sensitive     NITROFURANTOIN 64 INTERMEDIATE Intermediate     TRIMETH/SULFA <=20 SENSITIVE Sensitive     AMPICILLIN/SULBACTAM 16 INTERMEDIATE Intermediate     PIP/TAZO 16 SENSITIVE Sensitive     Extended ESBL NEGATIVE Sensitive     * >=100,000 COLONIES/mL KLEBSIELLA PNEUMONIAE  C difficile quick scan w PCR reflex     Status: None   Collection Time: 01/11/17 12:16 PM  Result Value Ref Range Status   C Diff antigen NEGATIVE NEGATIVE Final   C Diff toxin NEGATIVE NEGATIVE Final   C Diff interpretation No C. difficile detected.  Final  Gastrointestinal Panel by PCR , Stool     Status: None   Collection Time: 01/11/17 12:16 PM  Result Value Ref Range Status   Campylobacter species NOT DETECTED NOT DETECTED Final   Plesimonas shigelloides NOT DETECTED NOT DETECTED Final   Salmonella species NOT DETECTED NOT DETECTED Final   Yersinia enterocolitica NOT DETECTED NOT DETECTED Final   Vibrio species NOT DETECTED NOT DETECTED Final   Vibrio cholerae NOT DETECTED NOT DETECTED Final   Enteroaggregative E coli (EAEC) NOT DETECTED NOT DETECTED Final   Enteropathogenic E coli (EPEC) NOT DETECTED NOT DETECTED Final   Enterotoxigenic E coli (ETEC) NOT DETECTED NOT DETECTED Final   Shiga like toxin producing E coli (STEC) NOT DETECTED NOT DETECTED Final   Shigella/Enteroinvasive E coli (EIEC) NOT DETECTED NOT DETECTED Final   Cryptosporidium NOT DETECTED NOT DETECTED Final   Cyclospora cayetanensis NOT DETECTED NOT DETECTED Final  Entamoeba histolytica NOT DETECTED NOT DETECTED Final   Giardia lamblia NOT DETECTED NOT DETECTED Final   Adenovirus F40/41 NOT DETECTED NOT DETECTED Final   Astrovirus NOT DETECTED NOT  DETECTED Final   Norovirus GI/GII NOT DETECTED NOT DETECTED Final   Rotavirus A NOT DETECTED NOT DETECTED Final   Sapovirus (I, II, IV, and V) NOT DETECTED NOT DETECTED Final    Today   Subjective    [name] today has no headache,no chest abdominal pain,no new weakness tingling or numbness, feels much better wants to go home today.     Objective   Blood pressure (!) 134/47, pulse 65, temperature 98.1 F (36.7 C), temperature source Oral, resp. rate 18, height 4\' 11"  (1.499 m), weight 62.7 kg (138 lb 3.7 oz), SpO2 93 %.   Intake/Output Summary (Last 24 hours) at 01/17/2017 1120 Last data filed at 01/17/2017 0634 Gross per 24 hour  Intake 600 ml  Output 0 ml  Net 600 ml    Exam Awake Alert, Oriented x 3, No new F.N deficits, Normal affect [place].AT,PERRAL Supple Neck,No JVD, No cervical lymphadenopathy appriciated.  Symmetrical Chest wall movement, Good air movement bilaterally, CTAB RRR,No Gallops,Rubs or new Murmurs, No Parasternal Heave +ve B.Sounds, Abd Soft, Non tender, No organomegaly appriciated, No rebound -guarding or rigidity. No Cyanosis, Clubbing or edema, No new Rash or bruise   Data Review   CBC w Diff:  Lab Results  Component Value Date   WBC 13.6 (H) 01/17/2017   HGB 7.9 (L) 01/17/2017   HGB 11.8 07/14/2016   HGB 10.9 (L) 09/27/2012   HCT 24.6 (L) 01/17/2017   HCT 38.7 07/14/2016   HCT 31.6 (L) 09/27/2012   PLT 207 01/17/2017   PLT 229 07/14/2016   LYMPHOPCT 3 01/10/2017   LYMPHOPCT 12.2 (L) 09/27/2012   MONOPCT 4 01/10/2017   MONOPCT 6.5 09/27/2012   EOSPCT 0 01/10/2017   EOSPCT 1.8 09/27/2012   BASOPCT 0 01/10/2017   BASOPCT 1.0 09/27/2012    CMP:  Lab Results  Component Value Date   NA 140 01/17/2017   NA 138 01/09/2017   NA 135 (L) 12/27/2011   K 3.6 01/17/2017   K 4.5 12/27/2011   CL 108 01/17/2017   CL 98 12/27/2011   CO2 22 01/17/2017   CO2 28 12/27/2011   BUN 58 (H) 01/17/2017   BUN 69 (A) 01/09/2017   BUN 60.0 (H)  12/27/2011   CREATININE 2.91 (H) 01/17/2017   CREATININE 2.19 (H) 12/24/2015   CREATININE 1.5 (H) 12/27/2011   GLU 189 01/09/2017   PROT 5.3 (L) 01/10/2017   PROT 5.4 (L) 06/29/2016   ALBUMIN 1.7 (L) 01/17/2017   ALBUMIN 3.0 (L) 06/29/2016   BILITOT 0.6 01/10/2017   BILITOT 0.2 06/29/2016   ALKPHOS 93 01/10/2017   AST 20 01/10/2017   ALT 16 01/10/2017  .   Total Time in preparing paper work, data evaluation and todays exam - 65 minutes  Lala Lund M.D on 01/17/2017 at 11:20 AM  [facility]   Office  [phone]

## 2017-01-17 NOTE — Progress Notes (Signed)
Report called to [facility]. Pt awaiting transportation.

## 2017-01-17 NOTE — Progress Notes (Signed)
[name]  Chief Complaint:    Melena, anemia  Subjective: No melena today. Feels okay  Objective:  Vital signs in last 24 hours: Temp:  [97.7 F (36.5 C)-98.1 F (36.7 C)] 98.1 F (36.7 C) (11/27 0512) Pulse Rate:  [51-65] 65 (11/27 0512) Resp:  [10-18] 18 (11/27 0512) BP: (121-191)/(33-71) 134/47 (11/27 0512) SpO2:  [91 %-100 %] 93 % (11/27 0512) Weight:  [131 lb (59.4 kg)-138 lb 3.7 oz (62.7 kg)] 138 lb 3.7 oz (62.7 kg) (11/27 0512) Last BM Date: 01/15/17 General:   Alert, well-developed, white female  in NAD EENT:  Normal hearing, non icteric sclera, conjunctive pink.  Heart:  Regular rate and rhythm; soft murmur. No lower extremity edema Pulm: Normal respiratory effort Abdomen:  Soft, nondistended, nontender.  Normal bowel sounds, no masses felt. No hepatomegaly.    Neurologic:  Alert and  oriented x4;  grossly normal neurologically. Psych:  Pleasant, cooperative.  Normal mood and affect.   Intake/Output from previous day: 11/26 0701 - 11/27 0700 In: 900 [P.O.:600; I.V.:300] Out: 0  Intake/Output this shift: No intake/output data recorded.  Lab Results: Recent Labs    01/15/17 0500 01/16/17 0515 01/17/17 0553  WBC 16.3* 13.6* 13.6*  HGB 8.4* 8.2* 7.9*  HCT 25.7* 25.2* 24.6*  PLT 252 230 207   BMET Recent Labs    01/15/17 0500 01/16/17 0515 01/17/17 0553  NA 136 139 140  K 4.5 3.7 3.6  CL 103 106 108  CO2 22 22 22   GLUCOSE 214* 112* 77  BUN 65* 62* 58*  CREATININE 2.96* 2.87* 2.91*  CALCIUM 7.5* 7.9* 8.1*   LFT Recent Labs    01/17/17 0553  ALBUMIN 1.7*    ASSESSMENT / PLAN:   66 yo female with abdominal pain, melena and worsenig of chronic anemia.  Known hx of intestinal AVMs. Capsule endo this admission revealed multiple small bowel erosions, aphthous ulcers and 1-2 ulcers in visualized portion of right colon. Colonoscopy yesterday - terminal ileum appeared normal but biopsies were taken due to the  abnormal capsule findings.  A single localized nonbleeding erosion with mild surrounding erythema was found at the ileocecal valve.  A 6 mm sessile polyp was removed from the transverse colon.  -Hemoglobin down slightly from 8.2-7.9 overnight but overall stable.  No melena today.  No further inpatient GI workup.  Patient should avoid NSAIDs.  I have made her a follow-up appointment in our office for December 12.  She could go home on oral iron.  No IV iron given anaphylactic reaction  Discharge Planning Diet:as tolerated Anticoagulation and antiplatelets:  NA Discharge Medications: No change from Gi standpoint Follow up: with me on 12/12 - appt in [facility]   Principal Problem:   Acute kidney injury superimposed on chronic kidney disease ([place]) Active Problems:   Chronic obstructive pulmonary disease ([place])   Renal artery stenosis (H[facility])   Diet-controlled diabetes mellitus (H[facility])   Iron deficiency anemia   Stage 4 chronic kidney disease (H[facility])   CAD (coronary artery disease)   Chronic diastolic CHF (congestive heart failure) (H[facility])   Protein-calorie malnutrition ([place])   AKI (acute kidney injury) ([place])   Dementia   Occult blood in stools   Anemia   Abscess of groin   Anaphylaxis   Benign neoplasm of transverse colon     LOS: 6 days   [name] ,NP 01/17/2017, 10:05 AM  Pager number [phone]    Attending physician's note   I have  taken an interval history, reviewed the chart and examined the patient. I agree with the Advanced Practitioner's note, impression and recommendations. Suspected NSAID induced small bowel erosions, colonic erosion and has history of intestinal AVMs. Either could have caused melena leading to anemia. Avoid ASA/NSAIDs long term. GI follow up as above. OK for discharge from GI standpoint. GI signing off.   Lucio Edward, MD [facility] [phone] Mon-Fri 8a-5p [phone] after 5p, weekends, holidays

## 2017-01-17 NOTE — Progress Notes (Signed)
RN paged because yellow DNR form had not been signed to go to SNF and transport was here to take her. Pt discharged by attending today. NP to bedside. NP verified with pt that she did not want resuscitation or life support in the event of arrest. She was wearing a DNR bracelet and DNR was ordered on chart. [place] form signed by NP.  While NP on floor, confusion about latest BP and being able to transport pt due to this. BP 123/38. Pt up walking and is alert and oriented. NP has no qualms with pt being discharged at this time. RN had already called report. RN called Hel[facility] again with this BP who agreed to take pt. Pt was transported to SNF.  KJKG, NP [facility]

## 2017-01-17 NOTE — Progress Notes (Signed)
Date: January 17, 2017 Rhonda Davis, BSN, RN3, CCM  [phone] Chart and notes review for patient progress and needs. Will follow for case management and discharge needs. Next review date: [id number] 

## 2017-01-18 ENCOUNTER — Non-Acute Institutional Stay (SKILLED_NURSING_FACILITY): Payer: Medicare Other | Admitting: Adult Health

## 2017-01-18 ENCOUNTER — Encounter (HOSPITAL_COMMUNITY): Payer: Self-pay | Admitting: Gastroenterology

## 2017-01-18 DIAGNOSIS — N179 Acute kidney failure, unspecified: Secondary | ICD-10-CM

## 2017-01-18 DIAGNOSIS — I2510 Atherosclerotic heart disease of native coronary artery without angina pectoris: Secondary | ICD-10-CM | POA: Diagnosis not present

## 2017-01-18 DIAGNOSIS — J441 Chronic obstructive pulmonary disease with (acute) exacerbation: Secondary | ICD-10-CM

## 2017-01-18 DIAGNOSIS — D508 Other iron deficiency anemias: Secondary | ICD-10-CM

## 2017-01-18 DIAGNOSIS — F329 Major depressive disorder, single episode, unspecified: Secondary | ICD-10-CM | POA: Diagnosis not present

## 2017-01-18 DIAGNOSIS — I5032 Chronic diastolic (congestive) heart failure: Secondary | ICD-10-CM | POA: Diagnosis not present

## 2017-01-18 DIAGNOSIS — R531 Weakness: Secondary | ICD-10-CM | POA: Diagnosis not present

## 2017-01-18 DIAGNOSIS — E440 Moderate protein-calorie malnutrition: Secondary | ICD-10-CM | POA: Diagnosis not present

## 2017-01-18 DIAGNOSIS — F0390 Unspecified dementia without behavioral disturbance: Secondary | ICD-10-CM

## 2017-01-18 DIAGNOSIS — E785 Hyperlipidemia, unspecified: Secondary | ICD-10-CM

## 2017-01-18 DIAGNOSIS — E876 Hypokalemia: Secondary | ICD-10-CM | POA: Diagnosis not present

## 2017-01-18 DIAGNOSIS — F32A Depression, unspecified: Secondary | ICD-10-CM

## 2017-01-18 DIAGNOSIS — Z8719 Personal history of other diseases of the digestive system: Secondary | ICD-10-CM | POA: Diagnosis not present

## 2017-01-18 DIAGNOSIS — N2889 Other specified disorders of kidney and ureter: Secondary | ICD-10-CM

## 2017-01-18 DIAGNOSIS — N184 Chronic kidney disease, stage 4 (severe): Secondary | ICD-10-CM

## 2017-01-18 NOTE — Progress Notes (Signed)
Location:  [place] Room Number: 111-A Place of Service:  SNF (31) Provider:  Mayan Kloepfer C. Medina-Vargas - NP  Arnoldo Morale, MD  Patient Care Team: Arnoldo Morale, MD as PCP - General (Family Medicine)  Extended Emergency Contact Information Primary Emergency Contact: Thalia Bloodgood Address: [place] [address] [place] [address] [place] Phone: [phone] Relation: Spouse Secondary Emergency Contact: Foy,Tonya Address: [place] [address] [place], [place] [address] [place] Phone: [phone] Mobile Phone: [phone] Relation: Daughter  Code Status:  Full Goals of care: Advanced Directive information Advanced Directives 01/10/2017  Does Patient Have a Medical Advance Directive? No  Type of Advance Directive Living will  Would patient like information on creating a medical advance directive? No - Patient declined  Pre-existing out of facility DNR order (yellow form or pink MOST form) -     Chief Complaint  Patient presents with  . Acute Visit    Followup on hospitalization at [facility] 11/20-11/27/18 for acute kidney injury superimposed on chronic kidney disease    HPI:  Pt is a 66 y.o. female seen today for followup of [facility]20-11/27/18 for AKI superimposed on CKD.  She was admitted to [facility] on 01/17/17 for short-term rehabilitation.  She has a PMH of DM with complication, anemia, anxiety/depression, history of CVA, COPD, renal artery stenosis, chronic diastolic CHF, HTN, Lewy body dementia, lung nodule, and hypokalemia. She was having short-term rehabilitation @ [facility] before being transferred to the hospital due to a recent hospitalization. CT scan of the abdomen and pelvis did not show any obstruction secondary to her right kidney stone there is no other acute change. She was complaining of worsening abdominal pain  and was thought to be due to her severe diffuse atherosclerosis with Rob Opal stenosis of the mesentery and celiac arteries, consistent with chronic mesenteric ischemia. Her hemoglobin trended down and GI was consulted. She had anaphylaxis to [facility] on 11/22.Capsule and/or revealed multiple small bilateral erosions, apthous ulcers and 1-2 ulcers visualized in the right colon. Colonoscopy showed nonbleeding erosion with mild surrounding erythema at the ileocecal valve. A 6 mm polyp was removed from the transverse colon.        Past Medical History:  Diagnosis Date  . Abscess, groin 12/2016  . Anemia, chronic disease 05/16/2008   Qualifier: Diagnosis of  By: Percival Spanish, MD, Farrel Gordon    . Bradycardia    a. Limiting BB titration.  [name] CAD (coronary artery disease)    a. NSTEMI 2007 in setting of anemia; LHC 50% LM, 60% LAD, 30% RI, occluded RCA with L-R collaterals, normal EF, treated medically; B. LHC 01/2015 prox-mid RCA 100%, ostial to prox LAD 70%, LM 50%, mid-distal circ 90%-not surgical candidate due to renal and pulmonary issues).  . Carotid artery disease ([place])    a. Duplex 12/2014: left CEA patent with elevated velocities, 60-79% RICA (stable over serial exams), 11-94% LICA stenosis (stable over serial exams), 50% LECA, elevated velocities in bilateral subclavian arteries.  . Chronic combined systolic (congestive) and diastolic (congestive) heart failure ([place])    a. 06/2016 Echo: EF 40-45%, mod MR, mildly dil LA/RA, mod TR, PASP 35mmHg.  . CKD (chronic kidney disease), stage IV ([place])   . COPD (chronic obstructive pulmonary disease) (HCC)    home oxygen  . Dementia   .  Depression   . Diabetes mellitus   . Diabetic retinopathy ([place])   . Essential hypertension   . GI AVM (gastrointestinal arteriovenous vascular malformation)    small bowel  . Hyperlipidemia   . Hypertensive heart disease with congestive heart failure ([place])   . Iron deficiency anemia    a. ? chronic GI blood  loss-->plavix d/c'd 06/2016.  [name] Peripheral vascular disease ([place])    a.s /p left common iliac and external iliac artery stents. b. H/o subclavian, mesenteric, and celiac artery stenosis (see below).  . Protein-calorie malnutrition ([place]) 01/24/2015  . Pulmonary hypertension ([place])    a. 06/2016 Echo: PASP 43mmHg.  [name] Renal artery stenosis ([place])    a. Duplex 2014: no obvious evidence of hemodynamically significant stenosis >60%, bilateral intrarenal arteries exhibit absent diastolic flow. There is evidence of elevated velocities of the mid aorta. 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  . Tobacco abuse   . 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 Dieaulafoy's lesion 01/25/2012   Past Surgical History:  Procedure Laterality Date  . APPENDECTOMY    . CARDIAC CATHETERIZATION N/A 01/23/2015   Procedure: Left Heart Cath and Coronary Angiography;  Surgeon: [name], MD;  Location: [place] CV LAB;  Service: Cardiovascular;  Laterality: N/A;  . CAROTID ENDARTERECTOMY    . CESAREAN SECTION    . CHOLECYSTECTOMY    . COLONOSCOPY WITH ESOPHAGOGASTRODUODENOSCOPY (EGD)  06/22/2016  . COLONOSCOPY WITH PROPOFOL N/A 01/16/2017   Procedure: COLONOSCOPY WITH PROPOFOL;  Surgeon: Ladene Artist, MD;  Location: [facility];  Service: Endoscopy;  Laterality: N/A;  . ENTEROSCOPY  01/26/2012   Procedure: ENTEROSCOPY;  Surgeon: Gatha Mayer, MD;  Location: [facility];  Service: Endoscopy;  Laterality: N/A;  . ENTEROSCOPY N/A 03/25/2015   Procedure: ENTEROSCOPY;  Surgeon: Doran Stabler, MD;  Location: [facility];  Service: Endoscopy;  Laterality: N/A;  . ENTEROSCOPY N/A 06/22/2016   Procedure: ENTEROSCOPY;  Surgeon: Jerene Bears, MD;  Location: [facility] ENDOSCOPY;  Service: Endoscopy;  Laterality: N/A;  . ESOPHAGOGASTRODUODENOSCOPY (EGD) WITH PROPOFOL N/A 06/22/2016   Procedure: ESOPHAGOGASTRODUODENOSCOPY (EGD) WITH PROPOFOL;  Surgeon: Jerene Bears, MD;  Location: [facility] ENDOSCOPY;   Service: Endoscopy;  Laterality: N/A;  . GIVENS CAPSULE STUDY N/A 01/13/2017   Procedure: GIVENS CAPSULE STUDY;  Surgeon: Mauri Pole, MD;  Location: [facility];  Service: Endoscopy;  Laterality: N/A;  . ILIAC ARTERY STENT     left  . SHOULDER ARTHROSCOPY    . TONSILLECTOMY      Allergies  Allergen Reactions  . Feraheme [Ferumoxytol] Anaphylaxis    hives    Outpatient Encounter Medications as of 01/18/2017  Medication Sig  . acetaminophen (TYLENOL) 325 MG tablet Take 325 mg by mouth every 6 (six) hours as needed.  [name] albuterol (PROVENTIL HFA;VENTOLIN HFA) 108 (90 Base) MCG/ACT inhaler Inhale 2 puffs into the lungs every 6 (six) hours as needed for wheezing or shortness of breath.  [name] aspirin EC 81 MG tablet Take 81 mg by mouth daily.  [name] atorvastatin (LIPITOR) 80 MG tablet TAKE 1 TABLET BY MOUTH  DAILY AT 6 PM.  . carvedilol (COREG) 6.25 MG tablet TAKE 1 TABLET BY MOUTH TWO  TIMES DAILY WITH A MEAL  . donepezil (ARICEPT) 10 MG tablet Take 1 tablet (10 mg total) by mouth at bedtime.  . ferrous sulfate 325 (65 FE) MG tablet Take 1 tablet (325 mg total) by mouth 2 (two) times daily with a meal.  .  fish oil-omega-3 fatty acids 1000 MG capsule Take 1 g by mouth 3 (three) times daily. Reported on 09/15/3662  . folic acid (FOLVITE) 1 MG tablet Take 1 tablet (1 mg total) by mouth daily.  . furosemide (LASIX) 40 MG tablet Take 40 mg by mouth 2 (two) times daily.  . hydrALAZINE (APRESOLINE) 50 MG tablet TAKE 1 TABLET BY MOUTH 3  TIMES DAILY  . nitroGLYCERIN (NITROSTAT) 0.4 MG SL tablet Place 0.4 mg under the tongue every 5 (five) minutes as needed for chest pain. X 3 doses  . NUTRITIONAL SUPPLEMENT LIQD Take 120 mLs 2 (two) times daily by mouth. MedPass  . pantoprazole (PROTONIX) 40 MG tablet TAKE 1 TABLET BY MOUTH  DAILY  . potassium chloride (K-DUR) 10 MEQ tablet Take 10 mEq daily by mouth.  . promethazine (PHENERGAN) 25 MG tablet Take 25 mg by mouth every 6 (six) hours as needed for  nausea or vomiting. x3 doses  . saccharomyces boulardii (FLORASTOR) 250 MG capsule Take 250 mg 2 (two) times daily by mouth.  . sertraline (ZOLOFT) 50 MG tablet Take 1 tablet (50 mg total) by mouth daily.  . traMADol (ULTRAM) 50 MG tablet Take 50 mg by mouth 2 (two) times daily.   No facility-administered encounter medications on file as of 01/18/2017.     Review of Systems  GENERAL: No fever, chills or weakness MOUTH and THROAT: Denies oral discomfort, gingival pain or bleeding, pain from teeth or hoarseness   RESPIRATORY: no cough, SOB, DOE, wheezing, hemoptysis CARDIAC: No chest pain, edema or palpitations GI: No abdominal pain, diarrhea, constipation, heart burn, nausea or vomiting GU: Denies dysuria, frequency, hematuria PSYCHIATRIC: Denies feelings of depression or anxiety. No report of hallucinations, insomnia, paranoia, or agitation   Immunization History  Administered Date(s) Administered  . Influenza Split 11/04/2011  . Influenza,inj,Quad PF,6+ Mos 11/10/2014  . Influenza-Unspecified 11/21/2016  . Pneumococcal-Unspecified 04/22/2015  . Tdap 11/10/2014   Pertinent  Health Maintenance Due  Topic Date Due  . FOOT EXAM  05/02/1960  . OPHTHALMOLOGY EXAM  05/02/1960  . MAMMOGRAM  05/02/2000  . DEXA SCAN  05/03/2015  . PNA vac Low Risk Adult (1 of 2 - PCV13) 04/21/2016  . URINE MICROALBUMIN  05/19/2016  . HEMOGLOBIN A1C  03/16/2017  . COLONOSCOPY  01/17/2027  . INFLUENZA VACCINE  Completed   Fall Risk  09/13/2016 08/12/2016 06/29/2016 06/15/2016 08/11/2015  Falls in the past year? No No No No Yes  Number falls in past yr: - - - - 2 or more  Injury with Fall? - - - - Yes  Risk for fall due to : - - - - -  Risk for fall due to: Comment - - - - -    Vitals:   01/18/17 1024  BP: 120/64  Pulse: 87  Resp: 18  Temp: 98.3 F (36.8 C)  TempSrc: Oral  SpO2: 93%  Weight: 138 lb 3.7 oz (62.7 kg)  Height: 4\' 11"  (1.499 m)   Body mass index is 27.92 kg/m.   Physical  Exam  GENERAL APPEARANCE: Well nourished. In no acute distress. Normal body habitus SKIN:  Skin is warm and dry.  MOUTH and THROAT: Lips are without lesions. Oral mucosa is moist and without lesions.  RESPIRATORY: Breathing is even & unlabored, BS CTAB CARDIAC: RRR, no murmur,no extra heart sounds, no edema GI: Abdomen soft, normal BS, no masses, no tenderness EXTREMITIES:  Able to move X 4 extremities PSYCHIATRIC: Alert to self, disoriented to time and place.  Affect and behavior are appropriate    Labs reviewed: Recent Labs    01/15/17 0500 01/16/17 0515 01/17/17 0553  NA 136 139 140  K 4.5 3.7 3.6  CL 103 106 108  CO2 22 22 22   GLUCOSE 214* 112* 77  BUN 65* 62* 58*  CREATININE 2.96* 2.87* 2.91*  CALCIUM 7.5* 7.9* 8.1*  MG 2.3 2.1 2.0  PHOS 6.1* 5.4* 5.0*   Recent Labs    06/29/16 1049 12/20/16 1900 01/10/17 1430  01/15/17 0500 01/16/17 0515 01/17/17 0553  AST 15 19 20   --   --   --   --   ALT 34* 10* 16  --   --   --   --   ALKPHOS 90 80 93  --   --   --   --   BILITOT 0.2 0.6 0.6  --   --   --   --   PROT 5.4* 6.0* 5.3*  --   --   --   --   ALBUMIN 3.0* 2.7* 2.1*   < > 1.9* 1.8* 1.7*   < > = values in this interval not displayed.   Recent Labs    12/29/16 0432 01/09/17 01/10/17 1430  01/15/17 0500 01/16/17 0515 01/17/17 0553  WBC 8.5 16.9 17.8*   < > 16.3* 13.6* 13.6*  NEUTROABS 7.3 15 16.5*  --   --   --   --   HGB 10.0* 10.6* 9.7*   < > 8.4* 8.2* 7.9*  HCT 30.1* 32* 29.2*   < > 25.7* 25.2* 24.6*  MCV 87.0  --  87.7   < > 91.8 91.6 93.5  PLT 320 259 270   < > 252 230 207   < > = values in this interval not displayed.   Lab Results  Component Value Date   TSH 1.848 01/11/2017   Lab Results  Component Value Date   HGBA1C 5.6 09/13/2016   Lab Results  Component Value Date   CHOL 128 09/20/2016   HDL 46 09/20/2016   LDLCALC 40 09/20/2016   LDLDIRECT 133.6 05/17/2012   TRIG 210 (H) 09/20/2016   CHOLHDL 2.8 09/20/2016    Significant  Diagnostic Results in last 30 days:  Ct Abdomen Pelvis Wo Contrast  Result Date: 01/10/2017 CLINICAL DATA:  Worsening kidney function, increased white cell count, and epigastric abdominal pain. EXAM: CT ABDOMEN AND PELVIS WITHOUT CONTRAST TECHNIQUE: Multidetector CT imaging of the abdomen and pelvis was performed following the standard protocol without IV contrast. COMPARISON:  12/20/2016 FINDINGS: Lower chest: 7 x 12 mm nodule in the right lung base anteriorly without change since previous study. The nodule was present without change on previous CT chest from 05/12/2015. Continued follow-up until 2 year stability can be demonstrated is warranted. Slight fibrosis in the lung bases. Calcified granuloma in the right lung base. Hepatobiliary: Surgical absence of the gallbladder. No bile duct dilatation. Vascular calcifications in the hepatic hilum. Pancreas: Unremarkable. No pancreatic ductal dilatation or surrounding inflammatory changes. Spleen: Normal in size without focal abnormality. Adrenals/Urinary Tract: No adrenal gland nodules. Prominent vascular calcifications in the renal artery's hand renal hilar vessels. No hydronephrosis or hydroureter. Subcentimeter lesions on both kidneys likely representing cysts. Hyperattenuating lesion on the lower pole left kidney is probably a hemorrhagic cyst. No change since previous study. Focal scarring in the midpole right kidney. Bladder is mostly decompressed. Mild diffuse bladder wall thickening may indicate cystitis. Correlation with urinalysis is recommended. No filling defects demonstrated. Stomach/Bowel:  The stomach, small bowel, and colon are not abnormally distended. No wall thickening or inflammatory changes are appreciated. Under distention limits evaluation. Contrast material and stool throughout the colon. No inflammatory changes. Appendix is not identified. Vascular/Lymphatic: Extensive vascular calcifications throughout the abdominal aorta and major branch  vessels. There is evidence of calcific stenosis of the lower aorta and extending into both iliac arteries. Likely there areas of high-grade stenosis in the iliac and external iliac arteries bilaterally. Prominent calcification in the mesenteric arteries and celiac axis artery's likely also results in significant stenosis. Reproductive: Uterus and ovaries are not enlarged. Other: No free air or free fluid in the abdomen. Abdominal wall musculature appears intact. Musculoskeletal: Degenerative changes in the spine. No destructive bone lesions. IMPRESSION: 1. Extensive aortic atherosclerosis and extensive calcific atherosclerosis throughout the abdominal vasculature. Likely there are areas of significant stenosis in multiple vessels. 2. No evidence of bowel obstruction or inflammation. 3. Subcentimeter lesions in both kidneys likely represent cysts although too small to characterize. Hemorrhagic cyst on the left kidney. 4. Unchanged appearance of a 7 x 12 mm nodule in the right lung base. Six-month follow-up study recommended to demonstrate continued stability. 5. Bladder wall thickening may indicate cystitis. Correlation with urinalysis recommended. Electronically Signed   By: Lucienne Capers M.D.   On: 01/10/2017 21:22   Ct Renal Stone Study  Result Date: 12/20/2016 CLINICAL DATA:  66 year old female with hematuria. EXAM: CT ABDOMEN AND PELVIS WITHOUT CONTRAST TECHNIQUE: Multidetector CT imaging of the abdomen and pelvis was performed following the standard protocol without IV contrast. COMPARISON:  Renal ultrasound dated 01/05/2016 and CT dated 09/07/2012 the FINDINGS: Evaluation of this exam is limited in the absence of intravenous contrast. Lower chest: There is a 10 x 8 mm subpleural nodule at the right lung base (series 4, image 9). A 3 mm nodule in the lateral right lower lobe (series 4, image 4) may represent a calcified granuloma. There is coronary vascular calcification. There is no intra-abdominal  free air. Small free fluid within the pelvis. Hepatobiliary: The liver is unremarkable. There is cholecystectomy changes. Pancreas: Unremarkable. No pancreatic ductal dilatation or surrounding inflammatory changes. Spleen: Normal in size without focal abnormality. Adrenals/Urinary Tract: The adrenal glands are unremarkable. There is an extrarenal pelvis on the right with mild pelviectasis. High attenuating content within the right renal collecting system and pelvis most consistent with blood product. This is of uncertain etiology but concerning for underlying neoplasm. Evaluation is very limited on this noncontrast CT. Further evaluation with CT urography or direct visualization with ureteroscopy recommended. There is no hydronephrosis or nephrolithiasis. There is mild right perinephric fluid. A 7 mm stone in the right pelvis (series 2, image 66) appears to be adjacent to the ureter and not in the right ureter. There is a 15 mm hypodense lesion in the upper pole of the left kidney which is not well characterized but likely represents a cyst. A subcentimeter high attenuating lesion in the inferior pole of the left kidney is also not characterized but may represent a complex/hemorrhagic cyst. There is no hydronephrosis or nephrolithiasis on the left. The left ureter is unremarkable. Stomach/Bowel: There is no evidence of bowel obstruction or active inflammation. Appendectomy. Vascular/Lymphatic: There is advanced aortoiliac atherosclerotic disease. There is extensive atherosclerotic calcification of the mesenteric vasculature as well as bilateral renovascular calcification. The IVC is grossly unremarkable. No portal venous gas. There is no adenopathy. Reproductive: The uterus is retroflexed. The ovaries are grossly unremarkable. Other: There is diffuse subcutaneous edema  and stranding. No fluid collection. Musculoskeletal: Osteopenia with degenerative changes of the spine. Grade 1 L4-L5 anterolisthesis. IMPRESSION: 1.  Mild caliectasis of the right renal pelvis containing blood product. This is of indeterminate etiology but underlying mass is not excluded. Further evaluation with CT urography or direct visualization with ureteroscopy recommended. There is no hydronephrosis or nephrolithiasis on either side. 2. **An incidental finding of potential clinical significance has been found. A 10 mm right lung base subpleural nodule. Consider one of the following in 3 months for both low-risk and high-risk individuals: (a) repeat chest CT, (b) follow-up PET-CT, or (c) tissue sampling. This recommendation follows the consensus statement: Guidelines for Management of Incidental Pulmonary Nodules Detected on CT Images: From the Fleischner Society 2017; Radiology 2017; 284:228-243.** 3. Advanced atherosclerotic disease of the aorta Aortic Atherosclerosis (ICD10-I70.0). Atherosclerotic disease of the mesenteric vasculature as well as renovascular calcification. 4. No bowel obstruction or active inflammation. Electronically Signed   By: Anner Crete M.D.   On: 12/20/2016 20:24    Assessment/Plan  1. Generalized weakness - for rehabilitation, PT and OT, for therapeutic strengthening exercises, fall precautions   2. Acute renal failure superimposed on stage 4 chronic kidney disease, unspecified acute renal failure type ([place]) - check BMP in 1 week Lab Results  Component Value Date   CREATININE 2.91 (H) 01/17/2017     3. History of GI bleed - canceled and no revealed multiple small bowel erosions, apthous ulcers and 1-2 ulcers in the visualized portion of right colon, colonoscopy appeared normal, with localized nonbleeding erosion with mild surrounding erythema was found at the ileocecal valve, a 6 mm sessile polyp was removed from the transverse colon, refrain from NSAID, continue pantoprazole 40 mg 1 tab daily, follow-up with gastroenterology on 02/01/17   4. Chronic diastolic heart failure (HCC) - no SOB, continue  carvedilol 6.25 mg 1 tablet twice a day, hydralazine 40 mg TID   5. Right renal mass - and renal stone,  Follow-up with Dr. Gloriann Loan of [facility] on 04/06/16   6. Coronary artery disease involving native coronary artery of native heart without angina pectoris -  No chest pains, continue ASA 81 mg daily, NTG PRN   7. COPD exacerbation (HCC) - no SOB,  Continue Proventil PRN   8. Chronic depression - mood is stable, continue Sertraline 50 mg 1 tab daily   9. Hyperlipidemia, unspecified hyperlipidemia type - continue Atorvastatin Lab Results  Component Value Date   CHOL 128 09/20/2016   HDL 46 09/20/2016   LDLCALC 40 09/20/2016   LDLDIRECT 133.6 05/17/2012   TRIG 210 (H) 09/20/2016   CHOLHDL 2.8 09/20/2016    10. Hypokalemia - continue KCL ER 10 meq 1 tab daily Lab Results  Component Value Date   K 3.6 01/17/2017      11. Dementia without behavioral disturbance, unspecified dementia type - continue Aricept 10 mg 1 tab PO Q D   12. Malnutrition of moderate degree (HCC) - RD consult, continue med pass 120 ml BID    13. Iron deficiency anemia - continue FeSO4 325 mg 1 tab BID       Family/ staff Communication:  Discussed plan with charge nurse, patient and husband.   Labs/tests ordered: Chest x-ray, CBC and BMP in 1 week     Jermiah Howton C. [place] - NP [employer] [phone]

## 2017-01-19 ENCOUNTER — Encounter: Payer: Self-pay | Admitting: Internal Medicine

## 2017-01-19 ENCOUNTER — Non-Acute Institutional Stay (SKILLED_NURSING_FACILITY): Payer: Medicare Other | Admitting: Internal Medicine

## 2017-01-19 DIAGNOSIS — I739 Peripheral vascular disease, unspecified: Secondary | ICD-10-CM

## 2017-01-19 DIAGNOSIS — N2889 Other specified disorders of kidney and ureter: Secondary | ICD-10-CM | POA: Diagnosis not present

## 2017-01-19 DIAGNOSIS — D508 Other iron deficiency anemias: Secondary | ICD-10-CM

## 2017-01-19 NOTE — Assessment & Plan Note (Signed)
Urology follow-up 12/14 as scheduled

## 2017-01-19 NOTE — Patient Instructions (Signed)
See assessment and plan under each diagnosis in the problem list and acutely for this visit 

## 2017-01-19 NOTE — Progress Notes (Signed)
This is a nursing facility follow up for [place] readmission within 30 days  Interim medical record and care since last [place] visit was updated with review of diagnostic studies and change in clinical status since last visit were documented.  HPI: The patient was hospitalized 11/20-11/27/18 for abdominal pain with nausea and clinical dehydration in the context of acute kidney injury. CT of the abdomen and pelvis revealed no obstruction despite known right renal calculus & R renal mass.  There were subcentimeter lesions in both kidneys most likely representing cysts. In the lower pole of the left kidney a hemorrhagic cyst was felt to be present. There was also scarring in the midpole of the right kidney.Bladder changes suggested cystitis.She received IV fluids and antibiotics for documented Klebsiella pneumonia UTI.Creatinine peaked at 3.71.  She has a urologic follow-up with Dr. Junious Silk in December for hematuria with a right renal mass and the 7 mm stone. She exhibited progressive decreasein hemoglobin for which she received Feraheme on 11/22. Unfortunately an anaphylactoid reaction to this required transfer to stepdown and Benadryl and steroids. The decreasing hemoglobin was in this context of previous multiple GI bleeds with erosive esophagitis. On exam streaks of blood were noted in the emesis and stools. Hemoccult of stool was obviously positive. Endoscopy revealed multiple small bowel ulcers, aphthous ulcers, and 1-2 ulcers in the right colon. Terminal ileum appeared normal. At colonoscopy a single localized nonbleeding erosion with mild surrounding erythema was found at the ileocecal valve. A 6 mm sessile polyp was removed from the transverse colon. Diffuse atherosclerotic changes were noted on the CT with a probable stenosis of the mesenteric and celiac arteries causing chronic mesenteric ischemia. She has documented peripheral vascular disease, status post left  common iliac and external iliac artery stents .Patient has a history of prior CVA and was on Plavix. She was discharged on aspirin and statin. 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 Carvedilol and hydralazine were held due to hypotension.  .Folic acid deficiency was documented she was started on folic acid. Patient was discharged on ferrous sulfate Last A1c was 5.6% in July, this most likely represents discordance because of the renal insufficiency. Aricept was continued for dementia. Discharge creatinine was 2.91, BUN 58, GFR 16, hemoglobin 7.9, and hematocrit 24.6. Left groin abscess was to be treated with daily silver dressings and dry dressing changes.  Review of systems: She denied any active symptoms. Dementia invalidated responses. Date given as 01/25/2011. She was unable to give her wedding anniversary. She did realize she been married 32 years to her second husband.  Physical exam:  Pertinent or positive findings: She was much more alert and interactive although appearing chronically ill. She is demented as noted. Facies are weathered. There is ptosis of the right eye. She has a few remaining lower mandibular teeth. Grade 3/4-1 systolic murmur is present. Pedal pulses are decreased, especially dorsalis pedis pulses; she has 1+ edema.  General appearance: no acute distress , increased work of breathing is present.   Lymphatic: No lymphadenopathy about the head, neck, axilla . Eyes: No conjunctival inflammation or lid edema is present. There is no scleral icterus. Ears:  External ear exam shows no significant lesions or deformities.   Nose:  External nasal examination shows no deformity or inflammation. Nasal mucosa are pink and moist without lesions ,exudates Oral exam: lips are healthy appearing.There is no oropharyngeal erythema or exudate .  Neck:  No thyromegaly, masses, tenderness  noted.    Heart:  Normal rate and regular rhythm. S1 and S2 normal without gallop, click, rub .  Lungs: without wheezes, rhonchi,rales , rubs. Abdomen:Bowel sounds are normal. Abdomen is soft and nontender with no organomegaly, hernias,masses. GU: deferred  Extremities:  No cyanosis, clubbing Neurologic exam : Balance,Rhomberg,finger to nose testing could not be completed due to clinical state Skin: Warm & dry w/o tenting. No significant lesions or rash.  See summary under each active problem in the Problem List with associated updated therapeutic plan

## 2017-01-19 NOTE — Assessment & Plan Note (Signed)
The diffuse and potentially serious risk of bleeding mitigates against considering the low-dose Xarelto and aspirin prophylactically for the ischemic bowel issues & PVD as secondary cardio vascular prevention

## 2017-01-20 ENCOUNTER — Encounter: Payer: Self-pay | Admitting: Internal Medicine

## 2017-01-20 NOTE — Assessment & Plan Note (Signed)
Monitor CBC ; consider above therapy if H/H improve & patient clinically stable

## 2017-01-24 ENCOUNTER — Encounter: Payer: Self-pay | Admitting: Adult Health

## 2017-01-24 ENCOUNTER — Encounter: Payer: Self-pay | Admitting: Internal Medicine

## 2017-01-24 ENCOUNTER — Non-Acute Institutional Stay (SKILLED_NURSING_FACILITY): Payer: Medicare Other | Admitting: Adult Health

## 2017-01-24 DIAGNOSIS — J189 Pneumonia, unspecified organism: Secondary | ICD-10-CM

## 2017-01-24 DIAGNOSIS — I5032 Chronic diastolic (congestive) heart failure: Secondary | ICD-10-CM

## 2017-01-24 DIAGNOSIS — R600 Localized edema: Secondary | ICD-10-CM

## 2017-01-24 NOTE — Progress Notes (Signed)
Location:  Financial [facility] of Service:  SNF (31) Provider:  Durenda Age, NP  Patient Care Team: Arnoldo Morale, MD as PCP - General (Family Medicine)  Extended Emergency Contact Information Primary Emergency Contact: Thalia Bloodgood Address: [facility] [address] [place] [address] [place] Phone: [phone] Relation: Spouse Secondary Emergency Contact: Foy,Tonya Address: [place], [place] [address] [place] Phone: [phone] Mobile Phone: [phone] Relation: Daughter  Code Status:  Full Code Goals of care: Advanced Directive information Advanced Directives 01/10/2017  Does Patient Have a Medical Advance Directive? No  Type of Advance Directive Living will  Would patient like information on creating a medical advance directive? No - Patient declined  Pre-existing out of facility DNR order (yellow form or pink MOST form) -     Chief Complaint  Patient presents with  . Acute Visit    Bilateral lower extremity edema    HPI:  Pt is a 66 y.o. female seen today for an acute visit secondary to bilateral lower extremity edema. She is a short-term rehabilitation resident at [facility].  She has a PMH of DM with complication, anemia, anxiety/depression, history of CVA, COPD, renal artery stenosis, chronic diastolic CHF, HTN, Lewy body dementia, lung nodule, and hypokalemia. She was seen today and noted to have BLE edema +2. Charge nurse reported that she noted it yesterday and encouraged feet to be elevated. Chest x-ray today showed right lung base infiltrate and small right effusion. No fever has been reported. Husband at bedside.   Past Medical History:  Diagnosis Date  . Abscess, groin 12/2016  . Anemia, chronic disease 05/16/2008   Qualifier: Diagnosis of  By: Percival Spanish, MD, Farrel Gordon    . Bradycardia    a. Limiting BB titration.  [name] CAD (coronary  artery disease)    a. NSTEMI 2007 in setting of anemia; LHC 50% LM, 60% LAD, 30% RI, occluded RCA with L-R collaterals, normal EF, treated medically; B. LHC 01/2015 prox-mid RCA 100%, ostial to prox LAD 70%, LM 50%, mid-distal circ 90%-not surgical candidate due to renal and pulmonary issues).  . Carotid artery disease ([facility])    a. Duplex 12/2014: left CEA patent with elevated velocities, 60-79% RICA (stable over serial exams), 44-03% LICA stenosis (stable over serial exams), 50% LECA, elevated velocities in bilateral subclavian arteries.  . Chronic combined systolic (congestive) and diastolic (congestive) heart failure ([facility])    a. 06/2016 Echo: EF 40-45%, mod MR, mildly dil LA/RA, mod TR, PASP 61mmHg.  . CKD (chronic kidney disease), stage IV ([facility])   . COPD (chronic obstructive pulmonary disease) (H[facility])    home oxygen  . Dementia   . Depression   . Diabetes mellitus   . Diabetic retinopathy ([facility] [facility])   . Essential hypertension   . GI AVM (gastrointestinal arteriovenous vascular malformation)    small bowel  . Hyperlipidemia   . Hypertensive heart disease with congestive heart failure ([facility])   . Iron deficiency anemia    a. ? chronic GI blood loss-->plavix d/c'd 06/2016.  [name] Peripheral vascular disease ([facility])    a.s /p left common iliac and external iliac artery stents. b. H/o subclavian, mesenteric, and celiac artery stenosis (see below).  . Protein-calorie malnutrition ([facility]) 01/24/2015  . Pulmonary hypertension ([facility])    a. 06/2016 Echo: PASP 46mmHg.  [name] Renal artery  stenosis ([facility])    a. Duplex 2014: no obvious evidence of hemodynamically significant stenosis >60%, bilateral intrarenal arteries exhibit absent diastolic flow. There is evidence of elevated velocities of the mid aorta. 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  . Tobacco abuse   . 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 Dieaulafoy's lesion 01/25/2012   Past Surgical History:  Procedure Laterality Date  .  APPENDECTOMY    . CARDIAC CATHETERIZATION N/A 01/23/2015   Procedure: Left Heart Cath and Coronary Angiography;  Surgeon: [name], MD;  Location: [facility];  Service: Cardiovascular;  Laterality: N/A;  . CAROTID ENDARTERECTOMY    . CESAREAN SECTION    . CHOLECYSTECTOMY    . COLONOSCOPY WITH ESOPHAGOGASTRODUODENOSCOPY (EGD)  06/22/2016  . COLONOSCOPY WITH PROPOFOL N/A 01/16/2017   Procedure: COLONOSCOPY WITH PROPOFOL;  Surgeon: Ladene Artist, MD;  Location: [facility];  Service: Endoscopy;  Laterality: N/A;  . ENTEROSCOPY  01/26/2012   Procedure: ENTEROSCOPY;  Surgeon: Gatha Mayer, MD;  Location: [facility];  Service: Endoscopy;  Laterality: N/A;  . ENTEROSCOPY N/A 03/25/2015   Procedure: ENTEROSCOPY;  Surgeon: Doran Stabler, MD;  Location: [facility];  Service: Endoscopy;  Laterality: N/A;  . ENTEROSCOPY N/A 06/22/2016   Procedure: ENTEROSCOPY;  Surgeon: Jerene Bears, MD;  Location: [facility] ENDOSCOPY;  Service: Endoscopy;  Laterality: N/A;  . ESOPHAGOGASTRODUODENOSCOPY (EGD) WITH PROPOFOL N/A 06/22/2016   Procedure: ESOPHAGOGASTRODUODENOSCOPY (EGD) WITH PROPOFOL;  Surgeon: Jerene Bears, MD;  Location: [facility] ENDOSCOPY;  Service: Endoscopy;  Laterality: N/A;  . GIVENS CAPSULE STUDY N/A 01/13/2017   Procedure: GIVENS CAPSULE STUDY;  Surgeon: Mauri Pole, MD;  Location: [facility];  Service: Endoscopy;  Laterality: N/A;  . ILIAC ARTERY STENT     left  . SHOULDER ARTHROSCOPY    . TONSILLECTOMY      Allergies  Allergen Reactions  . Feraheme [Ferumoxytol] Anaphylaxis    hives    Outpatient Encounter Medications as of 01/24/2017  Medication Sig  . acetaminophen (TYLENOL) 325 MG tablet Take 325 mg by mouth every 6 (six) hours as needed.  [name] albuterol (PROVENTIL HFA;VENTOLIN HFA) 108 (90 Base) MCG/ACT inhaler Inhale 2 puffs into the lungs every 6 (six) hours as needed for wheezing or shortness of breath.  [name] aspirin EC 81 MG tablet Take 81 mg by mouth daily.  [name]  atorvastatin (LIPITOR) 80 MG tablet TAKE 1 TABLET BY MOUTH  DAILY AT 6 PM.  . carvedilol (COREG) 6.25 MG tablet TAKE 1 TABLET BY MOUTH TWO  TIMES DAILY WITH A MEAL  . donepezil (ARICEPT) 10 MG tablet Take 1 tablet (10 mg total) by mouth at bedtime.  [name] doxycycline (MONODOX) 100 MG capsule Take 100 mg by mouth 2 (two) times daily.  . ferrous sulfate 325 (65 FE) MG tablet Take 1 tablet (325 mg total) by mouth 2 (two) times daily with a meal.  . fish oil-omega-3 fatty acids 1000 MG capsule Take 1 g by mouth 3 (three) times daily. Reported on 1/66/0630  . folic acid (FOLVITE) 1 MG tablet Take 1 tablet (1 mg total) by mouth daily.  . furosemide (LASIX) 40 MG tablet Take 40 mg by mouth 2 (two) times daily.  . hydrALAZINE (APRESOLINE) 50 MG tablet TAKE 1 TABLET BY MOUTH 3  TIMES DAILY  . nitroGLYCERIN (NITROSTAT) 0.4 MG SL tablet Place 0.4 mg under the tongue every 5 (five) minutes as needed for chest pain. X 3 doses  . NUTRITIONAL  SUPPLEMENT LIQD Take 120 mLs 2 (two) times daily by mouth. MedPass  . pantoprazole (PROTONIX) 40 MG tablet TAKE 1 TABLET BY MOUTH  DAILY  . potassium chloride (K-DUR) 10 MEQ tablet Take 10 mEq daily by mouth.  . promethazine (PHENERGAN) 25 MG tablet Take 25 mg by mouth every 6 (six) hours as needed for nausea or vomiting. x3 doses  . saccharomyces boulardii (FLORASTOR) 250 MG capsule Take 250 mg 2 (two) times daily by mouth.  . sertraline (ZOLOFT) 50 MG tablet Take 1 tablet (50 mg total) by mouth daily.  . traMADol (ULTRAM) 50 MG tablet Take 50 mg by mouth 2 (two) times daily.   No facility-administered encounter medications on file as of 01/24/2017.     Review of Systems  GENERAL: No change in appetite, no fatigue, no weight changes, no fever, chills or weakness MOUTH and THROAT: Denies oral discomfort, gingival pain  RESPIRATORY: no cough, SOB, DOE, wheezing, hemoptysis CARDIAC: No chest pain, or palpitations GI: No abdominal pain, diarrhea, constipation, heart burn,  nausea or vomiting GU: Denies dysuria, frequency, hematuria, incontinence, or discharge PSYCHIATRIC: Denies feelings of depression or anxiety. No report of hallucinations, insomnia, paranoia, or agitation   Immunization History  Administered Date(s) Administered  . Influenza Split 11/04/2011  . Influenza,inj,Quad PF,6+ Mos 11/10/2014  . Influenza-Unspecified 11/21/2016  . Pneumococcal-Unspecified 04/22/2015  . Tdap 11/10/2014   Pertinent  Health Maintenance Due  Topic Date Due  . FOOT EXAM  05/02/1960  . OPHTHALMOLOGY EXAM  05/02/1960  . MAMMOGRAM  05/02/2000  . DEXA SCAN  05/03/2015  . PNA vac Low Risk Adult (1 of 2 - PCV13) 04/21/2016  . URINE MICROALBUMIN  05/19/2016  . HEMOGLOBIN A1C  03/16/2017  . COLONOSCOPY  01/17/2027  . INFLUENZA VACCINE  Completed   Fall Risk  09/13/2016 08/12/2016 06/29/2016 06/15/2016 08/11/2015  Falls in the past year? No No No No Yes  Number falls in past yr: - - - - 2 or more  Injury with Fall? - - - - Yes  Risk for fall due to : - - - - -  Risk for fall due to: Comment - - - - -      Vitals:   01/24/17 1625  BP: (!) 115/54  Pulse: 73  Resp: 17  Temp: (!) 97 F (36.1 C)  TempSrc: Oral  SpO2: 93%  Weight: 124 lb (56.2 kg)  Height: 4\' 11"  (1.499 m)   Body mass index is 25.04 kg/m.   Physical Exam  GENERAL APPEARANCE: Well nourished. In no acute distress. Normal body habitus MOUTH and THROAT: Lips are without lesions. Oral mucosa is moist and without lesions.  RESPIRATORY: Breathing is even & unlabored, BS CTAB CARDIAC: RRR, no murmur,no extra heart sounds, BLE 2+edema GI: Abdomen soft, normal BS, no masses, no tenderness, no hepatomegaly, no splenomegaly EXTREMITIES:  Able to move X 4 extremities PSYCHIATRIC: Alert to self, disoriented to time and place. Affect and behavior are appropriate   Labs reviewed: Recent Labs    01/15/17 0500 01/16/17 0515 01/17/17 0553  NA 136 139 140  K 4.5 3.7 3.6  CL 103 106 108  CO2 22 22 22     GLUCOSE 214* 112* 77  BUN 65* 62* 58*  CREATININE 2.96* 2.87* 2.91*  CALCIUM 7.5* 7.9* 8.1*  MG 2.3 2.1 2.0  PHOS 6.1* 5.4* 5.0*   Recent Labs    06/29/16 1049 12/20/16 1900 01/10/17 1430  01/15/17 0500 01/16/17 0515 01/17/17 0553  AST 15 19 20   --   --   --   --  ALT 34* 10* 16  --   --   --   --   ALKPHOS 90 80 93  --   --   --   --   BILITOT 0.2 0.6 0.6  --   --   --   --   PROT 5.4* 6.0* 5.3*  --   --   --   --   ALBUMIN 3.0* 2.7* 2.1*   < > 1.9* 1.8* 1.7*   < > = values in this interval not displayed.   Recent Labs    12/29/16 0432 01/09/17 01/10/17 1430  01/15/17 0500 01/16/17 0515 01/17/17 0553  WBC 8.5 16.9 17.8*   < > 16.3* 13.6* 13.6*  NEUTROABS 7.3 15 16.5*  --   --   --   --   HGB 10.0* 10.6* 9.7*   < > 8.4* 8.2* 7.9*  HCT 30.1* 32* 29.2*   < > 25.7* 25.2* 24.6*  MCV 87.0  --  87.7   < > 91.8 91.6 93.5  PLT 320 259 270   < > 252 230 207   < > = values in this interval not displayed.   Lab Results  Component Value Date   TSH 1.848 01/11/2017   Lab Results  Component Value Date   HGBA1C 5.6 09/13/2016   Lab Results  Component Value Date   CHOL 128 09/20/2016   HDL 46 09/20/2016   LDLCALC 40 09/20/2016   LDLDIRECT 133.6 05/17/2012   TRIG 210 (H) 09/20/2016   CHOLHDL 2.8 09/20/2016    Significant Diagnostic Results in last 30 days:  Ct Abdomen Pelvis Wo Contrast  Result Date: 01/10/2017 CLINICAL DATA:  Worsening kidney function, increased white cell count, and epigastric abdominal pain. EXAM: CT ABDOMEN AND PELVIS WITHOUT CONTRAST TECHNIQUE: Multidetector CT imaging of the abdomen and pelvis was performed following the standard protocol without IV contrast. COMPARISON:  12/20/2016 FINDINGS: Lower chest: 7 x 12 mm nodule in the right lung base anteriorly without change since previous study. The nodule was present without change on previous CT chest from 05/12/2015. Continued follow-up until 2 year stability can be demonstrated is warranted. Slight  fibrosis in the lung bases. Calcified granuloma in the right lung base. Hepatobiliary: Surgical absence of the gallbladder. No bile duct dilatation. Vascular calcifications in the hepatic hilum. Pancreas: Unremarkable. No pancreatic ductal dilatation or surrounding inflammatory changes. Spleen: Normal in size without focal abnormality. Adrenals/Urinary Tract: No adrenal gland nodules. Prominent vascular calcifications in the renal artery's hand renal hilar vessels. No hydronephrosis or hydroureter. Subcentimeter lesions on both kidneys likely representing cysts. Hyperattenuating lesion on the lower pole left kidney is probably a hemorrhagic cyst. No change since previous study. Focal scarring in the midpole right kidney. Bladder is mostly decompressed. Mild diffuse bladder wall thickening may indicate cystitis. Correlation with urinalysis is recommended. No filling defects demonstrated. Stomach/Bowel: The stomach, small bowel, and colon are not abnormally distended. No wall thickening or inflammatory changes are appreciated. Under distention limits evaluation. Contrast material and stool throughout the colon. No inflammatory changes. Appendix is not identified. Vascular/Lymphatic: Extensive vascular calcifications throughout the abdominal aorta and major branch vessels. There is evidence of calcific stenosis of the lower aorta and extending into both iliac arteries. Likely there areas of high-grade stenosis in the iliac and external iliac arteries bilaterally. Prominent calcification in the mesenteric arteries and celiac axis artery's likely also results in significant stenosis. Reproductive: Uterus and ovaries are not enlarged. Other: No free air or free fluid in  the abdomen. Abdominal wall musculature appears intact. Musculoskeletal: Degenerative changes in the spine. No destructive bone lesions. IMPRESSION: 1. Extensive aortic atherosclerosis and extensive calcific atherosclerosis throughout the abdominal  vasculature. Likely there are areas of significant stenosis in multiple vessels. 2. No evidence of bowel obstruction or inflammation. 3. Subcentimeter lesions in both kidneys likely represent cysts although too small to characterize. Hemorrhagic cyst on the left kidney. 4. Unchanged appearance of a 7 x 12 mm nodule in the right lung base. Six-month follow-up study recommended to demonstrate continued stability. 5. Bladder wall thickening may indicate cystitis. Correlation with urinalysis recommended. Electronically Signed   By: Lucienne Capers M.D.   On: 01/10/2017 21:22    Assessment/Plan  1. HCAP (healthcare-associated pneumonia) - start doxycycline 100 mg by mouth twice a day 10 days and Florastor 250 mg 1 capsule twice a day 13 days, repeat chest x-ray PA and lateral in 1 week   2. Lower extremity edema - will order bilateral lower extremity venous ultrasound to rule out DVT   3. Chronic diastolic heart failure (HCC) - no SOB, continue carvedilol 6.25 mg 1 tab twice a day and hydralazine 40 mg twice a day      Elester Apodaca C. Jean Lafitte - NP [employer] [phone]

## 2017-01-26 ENCOUNTER — Other Ambulatory Visit: Payer: Self-pay | Admitting: Family Medicine

## 2017-01-26 DIAGNOSIS — E7800 Pure hypercholesterolemia, unspecified: Secondary | ICD-10-CM

## 2017-01-26 DIAGNOSIS — F0390 Unspecified dementia without behavioral disturbance: Secondary | ICD-10-CM

## 2017-01-27 ENCOUNTER — Non-Acute Institutional Stay (SKILLED_NURSING_FACILITY): Payer: Medicare Other | Admitting: Adult Health

## 2017-01-27 ENCOUNTER — Encounter: Payer: Self-pay | Admitting: Adult Health

## 2017-01-27 DIAGNOSIS — I5032 Chronic diastolic (congestive) heart failure: Secondary | ICD-10-CM

## 2017-01-27 DIAGNOSIS — R600 Localized edema: Secondary | ICD-10-CM | POA: Diagnosis not present

## 2017-01-27 DIAGNOSIS — J189 Pneumonia, unspecified organism: Secondary | ICD-10-CM | POA: Diagnosis not present

## 2017-01-27 NOTE — Progress Notes (Signed)
Location:  [facility] Room Number: 111-A Place of Service:  SNF (31) Provider:  Durenda Age, NP  Patient Care Team: Arnoldo Morale, MD as PCP - General (Family Medicine)  Extended Emergency Contact Information Primary Emergency Contact: Thalia Bloodgood Address: [facility] [address] [place] [address] [place] Phone: [phone] Relation: Spouse Secondary Emergency Contact: Foy,Tonya Address: [place], [place] [address] [place] Phone: [phone] Mobile Phone: [phone] Relation: Daughter  Code Status:  Full Code  Goals of care: Advanced Directive information Advanced Directives 01/10/2017  Does Patient Have a Medical Advance Directive? No  Type of Advance Directive Living will  Would patient like information on creating a medical advance directive? No - Patient declined  Pre-existing out of facility DNR order (yellow form or pink MOST form) -     Chief Complaint  Patient presents with  . Acute Visit    Patient with bilateral lower extremity edema    HPI: Pt is a 66 y.o. female seen today for an acute visit secondary to bilateral lower extremity edema. She is a short-term rehabilitation resident at [facility].  She has a PMH of DM with complication, anemia, anxiety/depression, history of CVA, COPD, renal artery stenosis, chronic diastolic CHF, HTN, Lewy body dementia, lung nodule, and hypokalemia. She was seen in her room today with husband @ bedside. Noted that she has BLE edema 2+, clear drainage seeping through her right lower leg and erythematous skin (bilateral lower legs). She had gained 5.5 lbs in 5 days, current weight is 128.8 lbs. She is now ambulating more with therapy. She is currently on Doxycycline for HCAP. Recent venous doppler on BLE was negative for DVT. She is currently on Lasix 40 mg BID for chronic diastolic heart failure. No  SOB noted.    Past Medical History:  Diagnosis Date  . Abscess, groin 12/2016  . Anemia, chronic disease 05/16/2008   Qualifier: Diagnosis of  By: Percival Spanish, MD, Farrel Gordon    . Bradycardia    a. Limiting BB titration.  [name] CAD (coronary artery disease)    a. NSTEMI 2007 in setting of anemia; LHC 50% LM, 60% LAD, 30% RI, occluded RCA with L-R collaterals, normal EF, treated medically; B. LHC 01/2015 prox-mid RCA 100%, ostial to prox LAD 70%, LM 50%, mid-distal circ 90%-not surgical candidate due to renal and pulmonary issues).  . Carotid artery disease ([place])    a. Duplex 12/2014: left CEA patent with elevated velocities, 60-79% RICA (stable over serial exams), 35-36% LICA stenosis (stable over serial exams), 50% LECA, elevated velocities in bilateral subclavian arteries.  . Chronic combined systolic (congestive) and diastolic (congestive) heart failure ([place])    a. 06/2016 Echo: EF 40-45%, mod MR, mildly dil LA/RA, mod TR, PASP 7mmHg.  . CKD (chronic kidney disease), stage IV ([place])   . COPD (chronic obstructive pulmonary disease) (HCC)    home oxygen  . Dementia   . Depression   . Diabetes mellitus   . Diabetic retinopathy ([place])   . Essential hypertension   . GI AVM (gastrointestinal arteriovenous vascular malformation)    small bowel  . Hyperlipidemia   . Hypertensive heart disease with congestive heart failure ([place])   . Iron deficiency anemia    a. ? chronic GI blood loss-->plavix d/c'd 06/2016.  [name] Peripheral vascular disease ([place])  a.s /p left common iliac and external iliac artery stents. b. H/o subclavian, mesenteric, and celiac artery stenosis (see below).  . Protein-calorie malnutrition ([place]) 01/24/2015  . Pulmonary hypertension ([place])    a. 06/2016 Echo: PASP 48mmHg.  [name] Renal artery stenosis ([place])    a. Duplex 2014: no obvious evidence of hemodynamically significant stenosis >60%, bilateral intrarenal arteries exhibit absent diastolic flow. There is evidence of elevated  velocities of the mid aorta. 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  . Tobacco abuse   . 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 Dieaulafoy's lesion 01/25/2012   Past Surgical History:  Procedure Laterality Date  . APPENDECTOMY    . CARDIAC CATHETERIZATION N/A 01/23/2015   Procedure: Left Heart Cath and Coronary Angiography;  Surgeon: [name], MD;  Location: [facility];  Service: Cardiovascular;  Laterality: N/A;  . CAROTID ENDARTERECTOMY    . CESAREAN SECTION    . CHOLECYSTECTOMY    . COLONOSCOPY WITH ESOPHAGOGASTRODUODENOSCOPY (EGD)  06/22/2016  . COLONOSCOPY WITH PROPOFOL N/A 01/16/2017   Procedure: COLONOSCOPY WITH PROPOFOL;  Surgeon: Ladene Artist, MD;  Location: [facility];  Service: Endoscopy;  Laterality: N/A;  . ENTEROSCOPY  01/26/2012   Procedure: ENTEROSCOPY;  Surgeon: Gatha Mayer, MD;  Location: [facility];  Service: Endoscopy;  Laterality: N/A;  . ENTEROSCOPY N/A 03/25/2015   Procedure: ENTEROSCOPY;  Surgeon: Doran Stabler, MD;  Location: [facility];  Service: Endoscopy;  Laterality: N/A;  . ENTEROSCOPY N/A 06/22/2016   Procedure: ENTEROSCOPY;  Surgeon: Jerene Bears, MD;  Location: [facility] ENDOSCOPY;  Service: Endoscopy;  Laterality: N/A;  . ESOPHAGOGASTRODUODENOSCOPY (EGD) WITH PROPOFOL N/A 06/22/2016   Procedure: ESOPHAGOGASTRODUODENOSCOPY (EGD) WITH PROPOFOL;  Surgeon: Jerene Bears, MD;  Location: [facility] ENDOSCOPY;  Service: Endoscopy;  Laterality: N/A;  . GIVENS CAPSULE STUDY N/A 01/13/2017   Procedure: GIVENS CAPSULE STUDY;  Surgeon: Mauri Pole, MD;  Location: [facility]OS[facility];  Service: Endoscopy;  Laterality: N/A;  . ILIAC ARTERY STENT     left  . SHOULDER ARTHROSCOPY    . TONSILLECTOMY      Allergies  Allergen Reactions  . Feraheme [Ferumoxytol] Anaphylaxis    hives    Outpatient Encounter Medications as of 01/27/2017  Medication Sig  . acetaminophen (TYLENOL) 325 MG tablet Take 325 mg by mouth every 6 (six)  hours as needed.  [name] albuterol (PROVENTIL HFA;VENTOLIN HFA) 108 (90 Base) MCG/ACT inhaler Inhale 2 puffs into the lungs every 6 (six) hours as needed for wheezing or shortness of breath.  [name] aspirin EC 81 MG tablet Take 81 mg by mouth daily.  [name] atorvastatin (LIPITOR) 80 MG tablet TAKE 1 TABLET BY MOUTH  DAILY AT 6 PM.  . carvedilol (COREG) 6.25 MG tablet TAKE 1 TABLET BY MOUTH TWO  TIMES DAILY WITH A MEAL  . donepezil (ARICEPT) 10 MG tablet Take 1 tablet (10 mg total) by mouth at bedtime.  [name] doxycycline (MONODOX) 100 MG capsule Take 100 mg by mouth 2 (two) times daily.  . ferrous sulfate 325 (65 FE) MG tablet Take 1 tablet (325 mg total) by mouth 2 (two) times daily with a meal.  . fish oil-omega-3 fatty acids 1000 MG capsule Take 1 g by mouth 3 (three) times daily. Reported on 1/61/0960  . folic acid (FOLVITE) 1 MG tablet Take 1 tablet (1 mg total) by mouth daily.  . furosemide (LASIX) 40 MG tablet Take 40 mg by mouth 2 (two) times daily.  [name]  hydrALAZINE (APRESOLINE) 50 MG tablet TAKE 1 TABLET BY MOUTH 3  TIMES DAILY  . nitroGLYCERIN (NITROSTAT) 0.4 MG SL tablet Place 0.4 mg under the tongue every 5 (five) minutes as needed for chest pain. X 3 doses  . NUTRITIONAL SUPPLEMENT LIQD Take 120 mLs 2 (two) times daily by mouth. MedPass  . pantoprazole (PROTONIX) 40 MG tablet TAKE 1 TABLET BY MOUTH  DAILY  . potassium chloride (K-DUR) 10 MEQ tablet Take 10 mEq daily by mouth.  . promethazine (PHENERGAN) 25 MG tablet Take 25 mg by mouth every 6 (six) hours as needed for nausea or vomiting. x3 doses  . saccharomyces boulardii (FLORASTOR) 250 MG capsule Take 250 mg 2 (two) times daily by mouth.  . sertraline (ZOLOFT) 50 MG tablet Take 1 tablet (50 mg total) by mouth daily.  . traMADol (ULTRAM) 50 MG tablet Take 50 mg by mouth 2 (two) times daily.   No facility-administered encounter medications on file as of 01/27/2017.     Review of Systems  GENERAL: No change in appetite, no fatigue, no weight  changes, no fever, chills or weakness MOUTH and THROAT: Denies oral discomfort, gingival pain or bleeding RESPIRATORY: no cough, SOB, DOE, wheezing, hemoptysis CARDIAC: No chest pain, or palpitations GI: No abdominal pain, diarrhea, constipation, heart burn, nausea or vomiting GU: Denies dysuria, frequency, hematuria, incontinence, or discharge PSYCHIATRIC: Denies feelings of depression or anxiety. No report of hallucinations, insomnia, paranoia, or agitation   Immunization History  Administered Date(s) Administered  . Influenza Split 11/04/2011  . Influenza,inj,Quad PF,6+ Mos 11/10/2014  . Influenza-Unspecified 11/21/2016  . Pneumococcal-Unspecified 04/22/2015  . Tdap 11/10/2014   Pertinent  Health Maintenance Due  Topic Date Due  . FOOT EXAM  05/02/1960  . OPHTHALMOLOGY EXAM  05/02/1960  . MAMMOGRAM  05/02/2000  . DEXA SCAN  05/03/2015  . PNA vac Low Risk Adult (1 of 2 - PCV13) 04/21/2016  . URINE MICROALBUMIN  05/19/2016  . HEMOGLOBIN A1C  03/16/2017  . COLONOSCOPY  01/17/2027  . INFLUENZA VACCINE  Completed   Fall Risk  09/13/2016 08/12/2016 06/29/2016 06/15/2016 08/11/2015  Falls in the past year? No No No No Yes  Number falls in past yr: - - - - 2 or more  Injury with Fall? - - - - Yes  Risk for fall due to : - - - - -  Risk for fall due to: Comment - - - - -      Vitals:   01/27/17 1459  BP: 115/80  Pulse: 67  Resp: 20  Temp: 98 F (36.7 C)  TempSrc: Oral  SpO2: 93%  Weight: 123 lb (55.8 kg)  Height: 4\' 11"  (1.499 m)   Body mass index is 24.84 kg/m.  Physical Exam  GENERAL APPEARANCE: Well nourished. In no acute distress. Normal body habitus SKIN:  Bilateral lower legs erythematous, clear drainage seeping through the right lower leg MOUTH and THROAT: Lips are without lesions. Oral mucosa is moist and without lesions.  RESPIRATORY: Breathing is even & unlabored, BS CTAB CARDIAC: RRR, no murmur,no extra heart sounds, BLE 2+ edema GI: Abdomen soft, normal BS,  no masses, no tenderness EXTREMITIES: Able to move X 4 extremities PSYCHIATRIC: Alert to self and disoriented to time and place. Affect and behavior are appropriate   Labs reviewed: Recent Labs    01/15/17 0500 01/16/17 0515 01/17/17 0553  NA 136 139 140  K 4.5 3.7 3.6  CL 103 106 108  CO2 22 22 22   GLUCOSE 214* 112*  77  BUN 65* 62* 58*  CREATININE 2.96* 2.87* 2.91*  CALCIUM 7.5* 7.9* 8.1*  MG 2.3 2.1 2.0  PHOS 6.1* 5.4* 5.0*   Recent Labs    06/29/16 1049 12/20/16 1900 01/10/17 1430  01/15/17 0500 01/16/17 0515 01/17/17 0553  AST 15 19 20   --   --   --   --   ALT 34* 10* 16  --   --   --   --   ALKPHOS 90 80 93  --   --   --   --   BILITOT 0.2 0.6 0.6  --   --   --   --   PROT 5.4* 6.0* 5.3*  --   --   --   --   ALBUMIN 3.0* 2.7* 2.1*   < > 1.9* 1.8* 1.7*   < > = values in this interval not displayed.   Recent Labs    12/29/16 0432 01/09/17 01/10/17 1430  01/15/17 0500 01/16/17 0515 01/17/17 0553  WBC 8.5 16.9 17.8*   < > 16.3* 13.6* 13.6*  NEUTROABS 7.3 15 16.5*  --   --   --   --   HGB 10.0* 10.6* 9.7*   < > 8.4* 8.2* 7.9*  HCT 30.1* 32* 29.2*   < > 25.7* 25.2* 24.6*  MCV 87.0  --  87.7   < > 91.8 91.6 93.5  PLT 320 259 270   < > 252 230 207   < > = values in this interval not displayed.   Lab Results  Component Value Date   TSH 1.848 01/11/2017   Lab Results  Component Value Date   HGBA1C 5.6 09/13/2016   Lab Results  Component Value Date   CHOL 128 09/20/2016   HDL 46 09/20/2016   LDLCALC 40 09/20/2016   LDLDIRECT 133.6 05/17/2012   TRIG 210 (H) 09/20/2016   CHOLHDL 2.8 09/20/2016    Significant Diagnostic Results in last 30 days:  Ct Abdomen Pelvis Wo Contrast  Result Date: 01/10/2017 CLINICAL DATA:  Worsening kidney function, increased white cell count, and epigastric abdominal pain. EXAM: CT ABDOMEN AND PELVIS WITHOUT CONTRAST TECHNIQUE: Multidetector CT imaging of the abdomen and pelvis was performed following the standard protocol  without IV contrast. COMPARISON:  12/20/2016 FINDINGS: Lower chest: 7 x 12 mm nodule in the right lung base anteriorly without change since previous study. The nodule was present without change on previous CT chest from 05/12/2015. Continued follow-up until 2 year stability can be demonstrated is warranted. Slight fibrosis in the lung bases. Calcified granuloma in the right lung base. Hepatobiliary: Surgical absence of the gallbladder. No bile duct dilatation. Vascular calcifications in the hepatic hilum. Pancreas: Unremarkable. No pancreatic ductal dilatation or surrounding inflammatory changes. Spleen: Normal in size without focal abnormality. Adrenals/Urinary Tract: No adrenal gland nodules. Prominent vascular calcifications in the renal artery's hand renal hilar vessels. No hydronephrosis or hydroureter. Subcentimeter lesions on both kidneys likely representing cysts. Hyperattenuating lesion on the lower pole left kidney is probably a hemorrhagic cyst. No change since previous study. Focal scarring in the midpole right kidney. Bladder is mostly decompressed. Mild diffuse bladder wall thickening may indicate cystitis. Correlation with urinalysis is recommended. No filling defects demonstrated. Stomach/Bowel: The stomach, small bowel, and colon are not abnormally distended. No wall thickening or inflammatory changes are appreciated. Under distention limits evaluation. Contrast material and stool throughout the colon. No inflammatory changes. Appendix is not identified. Vascular/Lymphatic: Extensive vascular calcifications throughout the abdominal aorta and  major branch vessels. There is evidence of calcific stenosis of the lower aorta and extending into both iliac arteries. Likely there areas of high-grade stenosis in the iliac and external iliac arteries bilaterally. Prominent calcification in the mesenteric arteries and celiac axis artery's likely also results in significant stenosis. Reproductive: Uterus and  ovaries are not enlarged. Other: No free air or free fluid in the abdomen. Abdominal wall musculature appears intact. Musculoskeletal: Degenerative changes in the spine. No destructive bone lesions. IMPRESSION: 1. Extensive aortic atherosclerosis and extensive calcific atherosclerosis throughout the abdominal vasculature. Likely there are areas of significant stenosis in multiple vessels. 2. No evidence of bowel obstruction or inflammation. 3. Subcentimeter lesions in both kidneys likely represent cysts although too small to characterize. Hemorrhagic cyst on the left kidney. 4. Unchanged appearance of a 7 x 12 mm nodule in the right lung base. Six-month follow-up study recommended to demonstrate continued stability. 5. Bladder wall thickening may indicate cystitis. Correlation with urinalysis recommended. Electronically Signed   By: Lucienne Capers M.D.   On: 01/10/2017 21:22    Assessment/Plan 1. Lower extremity edema - increase Lasix from 40 mg BID to 40 mg give 1 1/2 tab = 60 mg PO BID and BMP on 01/30/17, cover bilateral lower legs with xeroform dressing and cover with Kerlix dressing Q other day   2. Chronic diastolic heart failure (HCC) - will increase Lasix to 60 mg BID, weigh daily and notify provider for weight gain of 3 lbs or more   3. HCAP - continue Doxycycline 100 mg BID for a total of 10 days   Durenda Age, NP [place] [phone] (Monday-Friday 8:00 a.m. - 5:00 p.m.) [phone] (after hours)

## 2017-01-30 ENCOUNTER — Other Ambulatory Visit: Payer: Self-pay | Admitting: Family Medicine

## 2017-01-30 DIAGNOSIS — F341 Dysthymic disorder: Secondary | ICD-10-CM

## 2017-02-01 ENCOUNTER — Other Ambulatory Visit (INDEPENDENT_AMBULATORY_CARE_PROVIDER_SITE_OTHER): Payer: Medicare Other

## 2017-02-01 ENCOUNTER — Ambulatory Visit (INDEPENDENT_AMBULATORY_CARE_PROVIDER_SITE_OTHER): Payer: Medicare Other | Admitting: Nurse Practitioner

## 2017-02-01 ENCOUNTER — Encounter: Payer: Self-pay | Admitting: Nurse Practitioner

## 2017-02-01 VITALS — BP 102/62 | HR 58 | Ht 59.0 in | Wt 125.0 lb

## 2017-02-01 DIAGNOSIS — R195 Other fecal abnormalities: Secondary | ICD-10-CM

## 2017-02-01 DIAGNOSIS — K633 Ulcer of intestine: Secondary | ICD-10-CM | POA: Diagnosis not present

## 2017-02-01 DIAGNOSIS — D649 Anemia, unspecified: Secondary | ICD-10-CM

## 2017-02-01 LAB — CBC
HCT: 27.5 % — ABNORMAL LOW (ref 36.0–46.0)
MCHC: 31.5 g/dL (ref 30.0–36.0)
MCV: 97 fl (ref 78.0–100.0)
PLATELETS: 220 10*3/uL (ref 150.0–400.0)
RBC: 2.83 Mil/uL — AB (ref 3.87–5.11)
RDW: 18.6 % — ABNORMAL HIGH (ref 11.5–15.5)
WBC: 9.8 10*3/uL (ref 4.0–10.5)

## 2017-02-01 NOTE — Progress Notes (Signed)
Chief Complaint:  Follow up  HPI: Patient is a 66 yo female with multiple medical problems not limited to COPD, CHF, CAD, and CKD. She is known to Dr. Henrene Pastor. Patient has history of GI bleed / gastric and small bowel AVMs and here for follow up after being hospitalized with recurrent GI bleed and anemia in late November. Patient presented to the ED with abdominal pain, complaints of black stool. CT scan suggested possible stenosis of the mesenteric and celiac arteries and possible chronic mesenteric ischemia. Her last colonoscopy was around 2008 but she had an enteroscopy done on 2 occasions within the past 2 years. A capsule study was ordered to look for active bleeding and localize AVM. Additionally she was ordered IV iron but unfortunately had a reaction with hives and questionable anaphylaxis which improved with Benadryl and steroids.  Capsule study showed multiple small bowel erosions and aphthous ulcers, also 1-2 ulcers were seen in the visualized portion of the right colon. She underwent inpatient colonoscopy. Terminal ileum with biopsy was negative.  A single localized nonbleeding erosion with mild surrounding edema was found of the ileocecal valve. Bx c/w erosion / inflammation. No IBD or malignancy on any of the biopsies. A 6 mm sessile polyp was removed from the transverse colon. Path compatible wirh tubular adenoma without HGD.  Anemia was felt to be related to the NSAID-related erosions  she was advised to avoid NSAID's. She went home on oral iron  Past Medical History:  Diagnosis Date  . Abscess, groin 12/2016  . Anemia, chronic disease 05/16/2008   Qualifier: Diagnosis of  By: Percival Spanish, MD, Farrel Gordon    . Bradycardia    a. Limiting BB titration.  [name] CAD (coronary artery disease)    a. NSTEMI 2007 in setting of anemia; LHC 50% LM, 60% LAD, 30% RI, occluded RCA with L-R collaterals, normal EF, treated medically; B. LHC 01/2015 prox-mid RCA 100%, ostial to prox LAD 70%, LM 50%,  mid-distal circ 90%-not surgical candidate due to renal and pulmonary issues).  . Carotid artery disease ([place])    a. Duplex 12/2014: left CEA patent with elevated velocities, 60-79% RICA (stable over serial exams), 37-10% LICA stenosis (stable over serial exams), 50% LECA, elevated velocities in bilateral subclavian arteries.  . Chronic combined systolic (congestive) and diastolic (congestive) heart failure ([place])    a. 06/2016 Echo: EF 40-45%, mod MR, mildly dil LA/RA, mod TR, PASP 57mmHg.  . CKD (chronic kidney disease), stage IV ([place])   . COPD (chronic obstructive pulmonary disease) (HCC)    home oxygen  . Dementia   . Depression   . Diabetes mellitus   . Diabetic retinopathy ([place])   . Essential hypertension   . GI AVM (gastrointestinal arteriovenous vascular malformation)    small bowel  . Hyperlipidemia   . Hypertensive heart disease with congestive heart failure ([place])   . Iron deficiency anemia    a. ? chronic GI blood loss-->plavix d/c'd 06/2016.  [name] Peripheral vascular disease ([place])    a.s /p left common iliac and external iliac artery stents. b. H/o subclavian, mesenteric, and celiac artery stenosis (see below).  . Protein-calorie malnutrition ([place]) 01/24/2015  . Pulmonary hypertension ([place])    a. 06/2016 Echo: PASP 47mmHg.  [name] Renal artery stenosis ([place])    a. Duplex 2014: no obvious evidence of hemodynamically significant stenosis >60%, bilateral intrarenal arteries exhibit absent diastolic flow. There is evidence of elevated velocities of the mid aorta. There is celiac artery and superior  mesenteric artery stenosis >70%.  . Tobacco abuse   . 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 Dieaulafoy's lesion 01/25/2012    Patient's surgical history, family medical history, social history, medications and allergies were all reviewed in Epic    Physical Exam: BP 102/62   Pulse (!) 58   Ht 4\' 11"  (1.499 m)   Wt 125 lb (56.7 kg)   BMI 25.25 kg/m   GENERAL:  Thin white female in  NAD PSYCH: :Pleasant, cooperative, normal affect EENT:  conjunctiva pink, mucous membranes moist, neck supple without masses CARDIAC:  RRR,  Soft murmur heard PULM: Normal respiratory effort, lungs CTA bilaterally, no wheezing ABDOMEN:  Nondistended, soft, nontender. No obvious masses, no hepatomegaly,  normal bowel sounds SKIN:  turgor, no lesions seen Musculoskeletal:  Normal muscle tone, normal strength NEURO: Alert and oriented x 3, no focal neurologic deficits   ASSESSMENT and PLAN:  1. Pleasant 66 yo female recently hospitalized with recurrent GI bleed secondary to NSAID related erosions in small and large bowel which were found on capsule Endo study and colonoscopy with biopsies. Here for hospital follow-up. Doing well. No further black stool.   2. Chronic normocytic anemia. Baseline hemoglobin 9-10 range. Recent admission for GI bleed and decline in hemoglobin to 7.7. She had a reaction to IV iron. Most recent hemoglobin stable at 7.9 -CBC today -Continue oral iron   Tye Savoy , NP 02/01/2017, 10:45 AM

## 2017-02-01 NOTE — Patient Instructions (Signed)
If you are age 66 or older, your body mass index should be between 23-30. Your Body mass index is 25.25 kg/m. If this is out of the aforementioned range listed, please consider follow up with your Primary Care Provider.  If you are age 26 or younger, your body mass index should be between 19-25. Your Body mass index is 25.25 kg/m. If this is out of the aformentioned range listed, please consider follow up with your Primary Care Provider.   Your physician has requested that you go to the basement for the following lab work before leaving today: CBC  Continue Iron twice daily.  Will call facility with lab results.  Follow up as needed.  Thank you for choosing me and [facility].   Tye Savoy, NP

## 2017-02-02 ENCOUNTER — Encounter: Payer: Self-pay | Admitting: Internal Medicine

## 2017-02-02 ENCOUNTER — Non-Acute Institutional Stay (SKILLED_NURSING_FACILITY): Payer: Medicare Other | Admitting: Internal Medicine

## 2017-02-02 DIAGNOSIS — N189 Chronic kidney disease, unspecified: Secondary | ICD-10-CM | POA: Diagnosis not present

## 2017-02-02 DIAGNOSIS — N2889 Other specified disorders of kidney and ureter: Secondary | ICD-10-CM

## 2017-02-02 DIAGNOSIS — I701 Atherosclerosis of renal artery: Secondary | ICD-10-CM | POA: Diagnosis not present

## 2017-02-02 DIAGNOSIS — N179 Acute kidney failure, unspecified: Secondary | ICD-10-CM | POA: Diagnosis not present

## 2017-02-02 DIAGNOSIS — R509 Fever, unspecified: Secondary | ICD-10-CM | POA: Diagnosis not present

## 2017-02-02 LAB — BASIC METABOLIC PANEL
BUN: 68 — AB (ref 4–21)
Creatinine: 3.5 — AB (ref 0.5–1.1)
Glucose: 141
POTASSIUM: 5.7 — AB (ref 3.4–5.3)
SODIUM: 145 (ref 137–147)

## 2017-02-02 NOTE — Patient Instructions (Addendum)
See assessment and plan under each diagnosis in the problem list and acutely for this visit Total time 41  minutes; greater than 50% of the visit spent reviewing labs & Xrays and counseling patient's husband and coordinating care for problems addressed at this encounter

## 2017-02-02 NOTE — Assessment & Plan Note (Signed)
02/02/17 renal function is worsening, patient is not on ACE inhibitor or ARB

## 2017-02-02 NOTE — Assessment & Plan Note (Signed)
Urology appointment 02/03/17 rule out obstructive nephropathy as contributing factor or cause

## 2017-02-02 NOTE — Progress Notes (Signed)
NURSING HOME LOCATION:  [facility] ROOM NUMBER:  111-A  CODE STATUS:  Full Code  PCP:  Arnoldo Morale, MD [place] [address]   This is a nursing facility follow up for specific acute issue of progressive renal insufficiency and fever.  Interim medical record and care since last [place] visit was updated with review of diagnostic studies and change in clinical status since last visit were documented.  HPI: [facility] NP has documented low-grade fever without localizing signs earlier today. This has resolved. CBC 12/12 revealed a white count of 9800 down from a blood count of 13,600 on 11/27. Her anemia improved from 7.9 hemoglobin to 8.7.  Unfortunately the patient's dementia precludes getting a definitive history. The patient has some hematuria w/o other GU complaints but does have some shortness of breath. Her creatinine had risen from 2.91 on 11/27 up to 3.53 on 12/11. BMET today reveals the creatinine is 3.50, essentially unchanged. CT stone protocol 10/30 revealed mild caliectasis of the right renal pelvis suggesting blood collection. An underlying mass cannot be excluded. No hydronephrosis or nephrolithiasis was present. She is on no nephrotoxic drugs. Chest x-ray 12/13 report :" cardiomegaly and bilateral basilar changes of atelectasis versus pneumonia". Images were actually reviewed. There is extremely poor inspiration to 6 interspaces. There is a faint tissue density in the right lower lobe greater than the left. These are improved compared to 12/4 AP film. Today's lateral film does suggest possible effusions posteriorly. On the AP view the costophrenic angles are sharp.  Review of systems: Dementia invalidated responses. Date given as [birth year]. She does state that she has "a little" abdominal discomfort in the left lower quadrant lasting 5 minutes without radiation. It is described as throbbing. She also describes "a little blood" in her  urine and possibly stool. She has no other GU symptoms. She denies any significant cough or sputum production. She was unaware of the fever.  Eyes: No redness, discharge, pain, vision change ENT/mouth: No nasal congestion,  purulent discharge, earache,change in hearing ,sore throat  Cardiovascular: No chest pain, palpitations,paroxysmal nocturnal dyspnea, claudication, edema  Respiratory: No cough, sputum production,hemoptysis, DOE , significant snoring,apnea   Gastrointestinal: No heartburn,dysphagia, nausea / vomiting Genitourinary: No dysuria, pyuria,  incontinence, nocturia Musculoskeletal: No joint stiffness, joint swelling, weakness,pain Dermatologic: No rash, pruritus, change in appearance of skin Hematologic/lymphatic: No significant bruising, lymphadenopathy,abnormal bleeding  Physical exam:  Pertinent or positive findings: She is much more alert than her usual state. She has few mandibular teeth which are plaque coated. Has a brief systolic murmur at right base. She has minor rales @ right lower lobe greater than left lower lobe. Clinically this is not significant. She does have one half-1+ edema of the lower extremity.  She is symmetrically weak in all extremities.  General appearance:Adequately nourished; no acute distress , increased work of breathing is present.   Lymphatic: No lymphadenopathy about the head, neck, axilla . Eyes: No conjunctival inflammation or lid edema is present. There is no scleral icterus. Ears:  External ear exam shows no significant lesions or deformities.   Nose:  External nasal examination shows no deformity or inflammation. Nasal mucosa are pink and moist without lesions ,exudates Oral exam: lips and gums are healthy appearing.There is no oropharyngeal erythema or exudate . Neck:  No thyromegaly, masses, tenderness noted.    Heart:  Normal rate and regular rhythm. S1 and S2 normal without gallop, click, rub .  Lungs: without wheezes,  rhonchi,rubs.  Abdomen:Bowel sounds are normal. Abdomen is soft and nontender with no organomegaly, hernias,masses. GU: deferred  Extremities:  No cyanosis, clubbing  Deep tendon reflexes are equal Skin: Warm & dry w/o tenting. No significant lesions or rash.  See summary under each active problem in the Problem List with associated updated therapeutic plan

## 2017-02-02 NOTE — Assessment & Plan Note (Signed)
[facility] outpatient appointment 02/03/17 at 1:30 PM with Dr. Gloriann Loan A copy this note will be faxed to his office for continuity of care

## 2017-02-03 ENCOUNTER — Other Ambulatory Visit: Payer: Self-pay | Admitting: *Deleted

## 2017-02-03 DIAGNOSIS — R310 Gross hematuria: Secondary | ICD-10-CM | POA: Diagnosis not present

## 2017-02-03 NOTE — Patient Outreach (Signed)
[name]) Care Management  02/03/2017  [name] [birth year] [id number]   Notified by Tillie Rung, SW at facility to discharge 02/04/17   Met with patient at facility. She reports she will discharge home with spouse. She reports her son and daughter are supportive but do not live in home.   Patient denies issues with transportation. Patient denies issues with medication management, states her family assists with medication, denies issues affording medication.  Patient reports she has a scale and weighs daily. She verbalizes she understands to call MD if weight goes up by 2# overnight.   RNCM reviewed [facility] program services.  Patient declines services but did accept a [facility] brochure.   Plan to sign off at this time. [name]. [name], RN, BSN, [place] [phone]) Business Cell  [phone]) Toll Free Office

## 2017-02-06 ENCOUNTER — Encounter: Payer: Self-pay | Admitting: Nurse Practitioner

## 2017-02-06 NOTE — Progress Notes (Signed)
Assessment and plan reviewed 

## 2017-02-08 ENCOUNTER — Other Ambulatory Visit: Payer: Self-pay | Admitting: Urology

## 2017-02-09 ENCOUNTER — Encounter (HOSPITAL_COMMUNITY): Payer: Self-pay | Admitting: *Deleted

## 2017-02-15 LAB — CBC AND DIFFERENTIAL
HCT: 24 — AB (ref 36–46)
Hemoglobin: 7.7 — AB (ref 12.0–16.0)
NEUTROS ABS: 11
Platelets: 262 (ref 150–399)
WBC: 12.7

## 2017-02-15 LAB — HEPATIC FUNCTION PANEL
ALT: 9 (ref 7–35)
AST: 18 (ref 13–35)
Alkaline Phosphatase: 83 (ref 25–125)
Bilirubin, Total: 0.2

## 2017-02-15 LAB — BASIC METABOLIC PANEL
BUN: 67 — AB (ref 4–21)
CREATININE: 4.2 — AB (ref 0.5–1.1)
Glucose: 95
POTASSIUM: 4.1 (ref 3.4–5.3)
SODIUM: 144 (ref 137–147)

## 2017-02-16 ENCOUNTER — Non-Acute Institutional Stay (SKILLED_NURSING_FACILITY): Payer: Medicare Other | Admitting: Internal Medicine

## 2017-02-16 ENCOUNTER — Encounter: Payer: Self-pay | Admitting: Internal Medicine

## 2017-02-16 DIAGNOSIS — N2889 Other specified disorders of kidney and ureter: Secondary | ICD-10-CM | POA: Diagnosis not present

## 2017-02-16 DIAGNOSIS — F0390 Unspecified dementia without behavioral disturbance: Secondary | ICD-10-CM | POA: Diagnosis not present

## 2017-02-16 DIAGNOSIS — I701 Atherosclerosis of renal artery: Secondary | ICD-10-CM

## 2017-02-16 DIAGNOSIS — N184 Chronic kidney disease, stage 4 (severe): Secondary | ICD-10-CM | POA: Diagnosis not present

## 2017-02-16 NOTE — Patient Instructions (Addendum)
See assessment and plan under each diagnosis in the problem list and acutely for this visitSee assessment and plan under each diagnosis in the problem list and acutely for this visit Total time 33 minutes; greater than 50% of the visit spent counseling patient's husband and coordinating care for problems addressed at this encounter He questions whether she could tolerate the planned urologic procedures. I agreed, but stated if we could not find a treatable or reversible process, more likely than not course would be progressive renal failure with associated coma

## 2017-02-16 NOTE — Assessment & Plan Note (Signed)
02/16/17 unable to provide the date, provide meaningful history or localize site of right-sided pain

## 2017-02-16 NOTE — Assessment & Plan Note (Addendum)
Nephrology consultation Decrease furosemide dose from 60 mg twice a day to 20 mg twice a day 1 week and repeat BMET Urologic procedures as scheduled by Dr. Gloriann Loan, [facility]

## 2017-02-16 NOTE — Assessment & Plan Note (Signed)
Renovascular ischemia could be contributing to the progressive creatinine rise Nephrology's assessment requested

## 2017-02-16 NOTE — Progress Notes (Signed)
NURSING HOME LOCATION:  [facility] ROOM NUMBER:  111-A  CODE STATUS:  Full Code  PCP:  Arnoldo Morale, MD  [place] [address]  This is a nursing facility follow up for specific acute issue of progressive renal insufficiency  Interim medical record and care since last [place] visit was updated with review of diagnostic studies and change in clinical status since last visit were documented.  HPI: Creatinine has risen to 4.2 from  2.87 on 11/26. Creatinine had been 4.05 on 06/21/16 but had improved to a value of 2.12 on 06/29/16. Since May creatinine has varied from a low of 2.17 on 10/30 to the present high of 4.2. It did drop as low as 2.6 on 11/13 but subsequently has progressively risen.  CAT scan stone protocol 12/20/16 suggested possible mass causing mild caliectasis of the right renal pelvis. No stones or hydronephrosis was noted. CT of the abdomen and pelvis without contrast 11/20 revealed extensive calcific atherosclerotic changess throughout the abdominal vasculature. Probable significant stenosis in multiple vessels was suggested. Hemorrhagic cyst in the left kidney and possible small cysts bilaterally were noted. Bladder wall thickening was present. Cystoscopy, bilateral retrograde pyelogram, possible ureteroscopy, biopsy and transurethral resection of bladder tumor is currently scheduled for 02/27/17 by Dr. Gloriann Loan, [facility]  She is on furosemide 40 mg 1.5 tablets(60 mg)  twice a day. She is on no other nephrotoxic drugs. The patient does have renal artery stenosis, she is not on an ACE inhibitor RRR. Blood pressure exhibits low diastolic without any significant or sustained systolic hypertension. Blood pressure has varied from 95/47-160/74. Her glucoses have ranged from a low of 70 up to a high of 300. She is not on metformin & in fact she is on no diabetic agents.  Review of systems: Dementia invalidated responses. Date given as [birth year] and  then 2050-08-31. The latter is her birthday. When asked the third time she stated it was 53. She was able to identify the president. When asked to roll on her back she complained of pain. She was unable to localize it although she moved her right hand from the right lateral thorax to the right thigh.  Physical exam:  Pertinent or positive findings: She stayed in the right lateral decubitus position resisting movement. She complained of pain on the right side when she tried to move but could not localize it. She described pain when I compress the foot and palpated both the lower leg and right thigh. No bruising was noted. She refused to eat her meal. She has bilateral ptosis. She has a few lower teeth which are stained and covered with plaque. Tongue is dry and slightly red. Grade 1 systolic murmur is present at the base. She has minor rales. Abdomen is doughy and protuberant but nontender. She has pitting over the right lower extremity from the knee down. She is generally weak, especially in the lower extremities. There is minimal movement of the lower extremities. Pedal pulses are decreased.  General appearance: no acute distress , increased work of breathing is present.   Lymphatic: No lymphadenopathy about the head, neck, axilla . Eyes: No conjunctival inflammation or lid edema is present. There is no scleral icterus. Ears:  External ear exam shows no significant lesions or deformities.   Nose:  External nasal examination shows no deformity or inflammation. Nasal mucosa without lesions ,exudates Neck:  No thyromegaly, masses, tenderness noted.    Heart:  Normal rate and regular rhythm.  S1 and S2 normal without gallop,  click, rub .  Lungs: without wheezes, rhonchi, rubs. Abdomen:Bowel sounds are normal.  GU: deferred  Extremities:  No cyanosis, clubbing,edema  Skin: Warm & dry w/o tenting. No significant lesions or rash.  See summary under each active problem in the Problem List with  associated updated therapeutic plan

## 2017-02-17 ENCOUNTER — Encounter (HOSPITAL_COMMUNITY): Payer: Self-pay

## 2017-02-17 ENCOUNTER — Inpatient Hospital Stay (HOSPITAL_COMMUNITY)
Admission: EM | Admit: 2017-02-17 | Discharge: 2017-02-19 | DRG: 394 | Disposition: A | Discharge status: Skilled Nursing Facility | Payer: Medicare Other | Attending: Family Medicine | Admitting: Family Medicine

## 2017-02-17 ENCOUNTER — Other Ambulatory Visit: Payer: Self-pay

## 2017-02-17 ENCOUNTER — Observation Stay (HOSPITAL_COMMUNITY): Payer: Medicare Other

## 2017-02-17 ENCOUNTER — Encounter (HOSPITAL_COMMUNITY): Payer: Self-pay | Admitting: *Deleted

## 2017-02-17 DIAGNOSIS — F419 Anxiety disorder, unspecified: Secondary | ICD-10-CM | POA: Diagnosis present

## 2017-02-17 DIAGNOSIS — I739 Peripheral vascular disease, unspecified: Secondary | ICD-10-CM | POA: Diagnosis present

## 2017-02-17 DIAGNOSIS — E1122 Type 2 diabetes mellitus with diabetic chronic kidney disease: Secondary | ICD-10-CM | POA: Diagnosis present

## 2017-02-17 DIAGNOSIS — N184 Chronic kidney disease, stage 4 (severe): Secondary | ICD-10-CM | POA: Diagnosis not present

## 2017-02-17 DIAGNOSIS — J449 Chronic obstructive pulmonary disease, unspecified: Secondary | ICD-10-CM | POA: Diagnosis not present

## 2017-02-17 DIAGNOSIS — F17210 Nicotine dependence, cigarettes, uncomplicated: Secondary | ICD-10-CM | POA: Diagnosis present

## 2017-02-17 DIAGNOSIS — E872 Acidosis, unspecified: Secondary | ICD-10-CM | POA: Diagnosis present

## 2017-02-17 DIAGNOSIS — I5042 Chronic combined systolic (congestive) and diastolic (congestive) heart failure: Secondary | ICD-10-CM | POA: Diagnosis present

## 2017-02-17 DIAGNOSIS — I6523 Occlusion and stenosis of bilateral carotid arteries: Secondary | ICD-10-CM | POA: Diagnosis not present

## 2017-02-17 DIAGNOSIS — D638 Anemia in other chronic diseases classified elsewhere: Secondary | ICD-10-CM | POA: Diagnosis present

## 2017-02-17 DIAGNOSIS — Z888 Allergy status to other drugs, medicaments and biological substances status: Secondary | ICD-10-CM

## 2017-02-17 DIAGNOSIS — D508 Other iron deficiency anemias: Secondary | ICD-10-CM

## 2017-02-17 DIAGNOSIS — I774 Celiac artery compression syndrome: Secondary | ICD-10-CM | POA: Diagnosis present

## 2017-02-17 DIAGNOSIS — I10 Essential (primary) hypertension: Secondary | ICD-10-CM | POA: Diagnosis not present

## 2017-02-17 DIAGNOSIS — E46 Unspecified protein-calorie malnutrition: Secondary | ICD-10-CM | POA: Diagnosis not present

## 2017-02-17 DIAGNOSIS — D649 Anemia, unspecified: Secondary | ICD-10-CM | POA: Diagnosis not present

## 2017-02-17 DIAGNOSIS — I252 Old myocardial infarction: Secondary | ICD-10-CM | POA: Diagnosis not present

## 2017-02-17 DIAGNOSIS — K551 Chronic vascular disorders of intestine: Secondary | ICD-10-CM | POA: Diagnosis not present

## 2017-02-17 DIAGNOSIS — E11319 Type 2 diabetes mellitus with unspecified diabetic retinopathy without macular edema: Secondary | ICD-10-CM | POA: Diagnosis present

## 2017-02-17 DIAGNOSIS — I130 Hypertensive heart and chronic kidney disease with heart failure and stage 1 through stage 4 chronic kidney disease, or unspecified chronic kidney disease: Secondary | ICD-10-CM | POA: Diagnosis not present

## 2017-02-17 DIAGNOSIS — D500 Iron deficiency anemia secondary to blood loss (chronic): Secondary | ICD-10-CM | POA: Diagnosis present

## 2017-02-17 DIAGNOSIS — E1151 Type 2 diabetes mellitus with diabetic peripheral angiopathy without gangrene: Secondary | ICD-10-CM | POA: Diagnosis present

## 2017-02-17 DIAGNOSIS — N2889 Other specified disorders of kidney and ureter: Secondary | ICD-10-CM | POA: Diagnosis not present

## 2017-02-17 DIAGNOSIS — N179 Acute kidney failure, unspecified: Secondary | ICD-10-CM | POA: Diagnosis not present

## 2017-02-17 DIAGNOSIS — E785 Hyperlipidemia, unspecified: Secondary | ICD-10-CM | POA: Diagnosis not present

## 2017-02-17 DIAGNOSIS — I272 Pulmonary hypertension, unspecified: Secondary | ICD-10-CM | POA: Diagnosis not present

## 2017-02-17 DIAGNOSIS — I5032 Chronic diastolic (congestive) heart failure: Secondary | ICD-10-CM | POA: Diagnosis present

## 2017-02-17 DIAGNOSIS — K633 Ulcer of intestine: Secondary | ICD-10-CM | POA: Diagnosis not present

## 2017-02-17 DIAGNOSIS — Z79899 Other long term (current) drug therapy: Secondary | ICD-10-CM

## 2017-02-17 DIAGNOSIS — I2510 Atherosclerotic heart disease of native coronary artery without angina pectoris: Secondary | ICD-10-CM | POA: Diagnosis present

## 2017-02-17 DIAGNOSIS — F0390 Unspecified dementia without behavioral disturbance: Secondary | ICD-10-CM | POA: Diagnosis not present

## 2017-02-17 DIAGNOSIS — N281 Cyst of kidney, acquired: Secondary | ICD-10-CM | POA: Diagnosis not present

## 2017-02-17 DIAGNOSIS — E119 Type 2 diabetes mellitus without complications: Secondary | ICD-10-CM

## 2017-02-17 DIAGNOSIS — Z66 Do not resuscitate: Secondary | ICD-10-CM | POA: Diagnosis present

## 2017-02-17 DIAGNOSIS — M79604 Pain in right leg: Secondary | ICD-10-CM | POA: Diagnosis not present

## 2017-02-17 DIAGNOSIS — D509 Iron deficiency anemia, unspecified: Secondary | ICD-10-CM | POA: Diagnosis present

## 2017-02-17 LAB — CBC WITH DIFFERENTIAL/PLATELET
BASOS ABS: 0 10*3/uL (ref 0.0–0.1)
Basophils Relative: 0 %
EOS ABS: 0.1 10*3/uL (ref 0.0–0.7)
EOS PCT: 1 %
HCT: 22.7 % — ABNORMAL LOW (ref 36.0–46.0)
Hemoglobin: 7.2 g/dL — ABNORMAL LOW (ref 12.0–15.0)
Lymphocytes Relative: 7 %
Lymphs Abs: 0.7 10*3/uL (ref 0.7–4.0)
MCH: 30 pg (ref 26.0–34.0)
MCHC: 31.7 g/dL (ref 30.0–36.0)
MCV: 94.6 fL (ref 78.0–100.0)
Monocytes Absolute: 0.7 10*3/uL (ref 0.1–1.0)
Monocytes Relative: 6 %
Neutro Abs: 9.7 10*3/uL — ABNORMAL HIGH (ref 1.7–7.7)
Neutrophils Relative %: 86 %
PLATELETS: 272 10*3/uL (ref 150–400)
RBC: 2.4 MIL/uL — AB (ref 3.87–5.11)
RDW: 15.1 % (ref 11.5–15.5)
WBC: 11.3 10*3/uL — AB (ref 4.0–10.5)

## 2017-02-17 LAB — COMPREHENSIVE METABOLIC PANEL
ALBUMIN: 2 g/dL — AB (ref 3.5–5.0)
ALT: 13 U/L — AB (ref 14–54)
AST: 24 U/L (ref 15–41)
Alkaline Phosphatase: 65 U/L (ref 38–126)
Anion gap: 11 (ref 5–15)
BUN: 65 mg/dL — AB (ref 6–20)
CHLORIDE: 114 mmol/L — AB (ref 101–111)
CO2: 16 mmol/L — AB (ref 22–32)
CREATININE: 3.78 mg/dL — AB (ref 0.44–1.00)
Calcium: 8.4 mg/dL — ABNORMAL LOW (ref 8.9–10.3)
GFR calc Af Amer: 13 mL/min — ABNORMAL LOW (ref >60)
GFR calc non Af Amer: 11 mL/min — ABNORMAL LOW (ref >60)
Glucose, Bld: 148 mg/dL — ABNORMAL HIGH (ref 65–99)
Potassium: 3.7 mmol/L (ref 3.5–5.1)
SODIUM: 141 mmol/L (ref 135–145)
Total Bilirubin: 0.6 mg/dL (ref 0.3–1.2)
Total Protein: 5.6 g/dL — ABNORMAL LOW (ref 6.5–8.1)

## 2017-02-17 LAB — RETICULOCYTES
RBC.: 2.36 MIL/uL — ABNORMAL LOW (ref 3.87–5.11)
Retic Count, Absolute: 51.9 10*3/uL (ref 19.0–186.0)
Retic Ct Pct: 2.2 % (ref 0.4–3.1)

## 2017-02-17 LAB — PREPARE RBC (CROSSMATCH)

## 2017-02-17 LAB — PROTIME-INR
INR: 1.13
Prothrombin Time: 14.4 seconds (ref 11.4–15.2)

## 2017-02-17 MED ORDER — FUROSEMIDE 20 MG PO TABS
20.0000 mg | ORAL_TABLET | Freq: Two times a day (BID) | ORAL | Status: DC
  Administered 2017-02-18 (×2): 20 mg via ORAL
  Filled 2017-02-17 (×2): qty 1

## 2017-02-17 MED ORDER — ACETAMINOPHEN 325 MG PO TABS: 325.0000 mg | ORAL_TABLET | Freq: Four times a day (QID) | ORAL | Status: DC | PRN

## 2017-02-17 MED ORDER — SODIUM CHLORIDE 0.9 % IV SOLN: 10.0000 mL/h | Freq: Once | INTRAVENOUS | Status: DC

## 2017-02-17 MED ORDER — SODIUM CHLORIDE 0.9 % IV SOLN
10.0000 mL/h | Freq: Once | INTRAVENOUS | Status: AC
  Administered 2017-02-18: 10 mL/h via INTRAVENOUS

## 2017-02-17 MED ORDER — FOLIC ACID 1 MG PO TABS
1.0000 mg | ORAL_TABLET | Freq: Every day | ORAL | Status: DC
  Administered 2017-02-18 – 2017-02-19 (×2): 1 mg via ORAL
  Filled 2017-02-17 (×2): qty 1

## 2017-02-17 MED ORDER — SERTRALINE HCL 50 MG PO TABS
50.0000 mg | ORAL_TABLET | Freq: Every day | ORAL | Status: DC
  Administered 2017-02-18 – 2017-02-19 (×2): 50 mg via ORAL
  Filled 2017-02-17 (×2): qty 1

## 2017-02-17 MED ORDER — BISACODYL 10 MG RE SUPP: 10.0000 mg | Freq: Every day | RECTAL | Status: DC | PRN

## 2017-02-17 MED ORDER — NUTRITIONAL SUPPLEMENT PO LIQD: 120.0000 mL | Freq: Two times a day (BID) | ORAL | Status: DC

## 2017-02-17 MED ORDER — ONDANSETRON HCL 4 MG/2ML IJ SOLN: 4.0000 mg | Freq: Four times a day (QID) | INTRAMUSCULAR | Status: DC | PRN

## 2017-02-17 MED ORDER — PANTOPRAZOLE SODIUM 40 MG PO TBEC
40.0000 mg | DELAYED_RELEASE_TABLET | Freq: Every day | ORAL | Status: DC
  Administered 2017-02-18 – 2017-02-19 (×2): 40 mg via ORAL
  Filled 2017-02-17 (×2): qty 1

## 2017-02-17 MED ORDER — SODIUM BICARBONATE 8.4 % IV SOLN
INTRAVENOUS | Status: DC
  Filled 2017-02-17: qty 850

## 2017-02-17 MED ORDER — TRAMADOL HCL 50 MG PO TABS: 50.0000 mg | ORAL_TABLET | Freq: Two times a day (BID) | ORAL | Status: DC | PRN

## 2017-02-17 MED ORDER — FERROUS SULFATE 325 (65 FE) MG PO TABS
325.0000 mg | ORAL_TABLET | Freq: Two times a day (BID) | ORAL | Status: DC
  Administered 2017-02-18 – 2017-02-19 (×3): 325 mg via ORAL
  Filled 2017-02-17 (×3): qty 1

## 2017-02-17 MED ORDER — HYDRALAZINE HCL 50 MG PO TABS
50.0000 mg | ORAL_TABLET | Freq: Three times a day (TID) | ORAL | Status: DC
  Administered 2017-02-18 – 2017-02-19 (×5): 50 mg via ORAL
  Filled 2017-02-17 (×5): qty 1

## 2017-02-17 MED ORDER — CARVEDILOL 6.25 MG PO TABS
6.2500 mg | ORAL_TABLET | Freq: Two times a day (BID) | ORAL | Status: DC
  Administered 2017-02-18 – 2017-02-19 (×3): 6.25 mg via ORAL
  Filled 2017-02-17 (×4): qty 1

## 2017-02-17 MED ORDER — ACETAMINOPHEN 650 MG RE SUPP
650.0000 mg | Freq: Four times a day (QID) | RECTAL | Status: DC | PRN
Start: 2017-02-17 — End: 2017-02-19

## 2017-02-17 MED ORDER — SODIUM BICARBONATE 650 MG PO TABS
650.0000 mg | ORAL_TABLET | Freq: Two times a day (BID) | ORAL | Status: DC
  Administered 2017-02-18 – 2017-02-19 (×4): 650 mg via ORAL
  Filled 2017-02-17 (×4): qty 1

## 2017-02-17 MED ORDER — DONEPEZIL HCL 5 MG PO TABS
10.0000 mg | ORAL_TABLET | Freq: Every day | ORAL | Status: DC
  Administered 2017-02-18 (×2): 10 mg via ORAL
  Filled 2017-02-17 (×2): qty 2

## 2017-02-17 MED ORDER — MAGNESIUM HYDROXIDE 400 MG/5ML PO SUSP: 30.0000 mL | Freq: Every day | ORAL | Status: DC | PRN

## 2017-02-17 MED ORDER — ONDANSETRON HCL 4 MG PO TABS: 4.0000 mg | ORAL_TABLET | Freq: Four times a day (QID) | ORAL | Status: DC | PRN

## 2017-02-17 MED ORDER — ACETAMINOPHEN 325 MG PO TABS
650.0000 mg | ORAL_TABLET | Freq: Four times a day (QID) | ORAL | Status: DC | PRN
Start: 2017-02-17 — End: 2017-02-19

## 2017-02-17 MED ORDER — ATORVASTATIN CALCIUM 80 MG PO TABS
80.0000 mg | ORAL_TABLET | Freq: Every day | ORAL | Status: DC
  Administered 2017-02-18: 80 mg via ORAL
  Filled 2017-02-17 (×2): qty 1
  Filled 2017-02-17: qty 2

## 2017-02-17 NOTE — ED Notes (Signed)
US tech at bedside

## 2017-02-17 NOTE — ED Notes (Signed)
Bed: WA01 Expected date:  Expected time:  Means of arrival:  Comments: 66 yo low hgb

## 2017-02-17 NOTE — H&P (Signed)
History and Physical    [name]:[id number] DOB: [birth year] DOA: 02/17/2017  PCP: Arnoldo Morale, MD  Patient coming from: [name]  I have personally briefly reviewed patient's old medical records in [place]  Chief Complaint: SOB, symptomatic anemia  HPI: [name] is a 66 y.o. female with medical history significant of CHF with EF 40-45%, CKD stage 4, HTN, CAD, dementia, COPD.  Patient sent in to ED with worsening symptomatic anemia.  Patient was recently admitted last month to hospital for symptomatic anemia due to iron def probably from chronic blood loss / chronic GIB due to small and large bowel erosions seen on colonoscopy / capsule study, felt possibly to be due to ASA/NSAIDs, also has h/o AVMs.  Patient was discharged.  HGB initially steady in Dec, but has dropped over last couple of days on lab work.  Creatinine also has increased from 2.9 to mid 3s since discharge.  Patient reports easy fatigue, SOB even with minimal activity, generalized weakness.  Patient doesn't seem to do very well with timing / dates (suspect this may be her dementia, especially after it looks like she does the same with Dr. Linna Darner on his notes).   ED Course: HGB 7.2 was 8.7 on 12/12 and 7.7 on 12/26, Bicarb 16 down from 22 on DC.  Hemoccult negative, but no stool in vault.  EDP ordered transfusion, we are asked to admit.   Review of Systems: ROS neg, but patient demented.  Past Medical History:  Diagnosis Date  . Abscess, groin 12/2016  . Anemia, chronic disease 05/16/2008   Qualifier: Diagnosis of  By: Percival Spanish, MD, Farrel Gordon    . Anxiety   . Bradycardia    a. Limiting BB titration.  [name] CAD (coronary artery disease)    a. NSTEMI 2007 in setting of anemia; LHC 50% LM, 60% LAD, 30% RI, occluded RCA with L-R collaterals, normal EF, treated medically; B. LHC 01/2015 prox-mid RCA 100%, ostial to prox LAD 70%, LM 50%, mid-distal circ 90%-not surgical candidate due to renal and  pulmonary issues).  . Carotid artery disease ([place])    a. Duplex 12/2014: left CEA patent with elevated velocities, 60-79% RICA (stable over serial exams), 81-19% LICA stenosis (stable over serial exams), 50% LECA, elevated velocities in bilateral subclavian arteries.  . Chronic combined systolic (congestive) and diastolic (congestive) heart failure ([place])    a. 06/2016 Echo: EF 40-45%, mod MR, mildly dil LA/RA, mod TR, PASP 24mmHg.  . CKD (chronic kidney disease), stage IV ([place])   . COPD (chronic obstructive pulmonary disease) (HCC)    home oxygen  . Dementia   . Dementia   . Depression   . Diabetes mellitus    type 2  . Diabetic retinopathy ([place])   . Essential hypertension   . GI AVM (gastrointestinal arteriovenous vascular malformation)    small bowel  . Hematuria   . Hyperlipidemia   . Hypertensive heart disease with congestive heart failure ([place])   . Iron deficiency anemia    a. ? chronic GI blood loss-->plavix d/c'd 06/2016.  . Muscle weakness   . Other abnormalities of gait and mobility   . Peripheral vascular disease ([place])    a.s /p left common iliac and external iliac artery stents. b. H/o subclavian, mesenteric, and celiac artery stenosis (see below).  . Protein-calorie malnutrition ([place]) 01/24/2015  . Pulmonary hypertension ([place])    a. 06/2016 Echo: PASP 81mmHg.  [name] Renal artery atherosclerosis ([place])   . Renal artery  stenosis ([place])    a. Duplex 2014: no obvious evidence of hemodynamically significant stenosis >60%, bilateral intrarenal arteries exhibit absent diastolic flow. There is evidence of elevated velocities of the mid aorta. 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  . Tobacco abuse   . 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 APPENDECTOMY    . CARDIAC CATHETERIZATION N/A 01/23/2015   Procedure: Left Heart Cath and Coronary Angiography;  Surgeon: [name], MD;  Location: [facility];  Service: Cardiovascular;  Laterality: N/A;  . CAROTID ENDARTERECTOMY    . CESAREAN SECTION    . CHOLECYSTECTOMY    . COLONOSCOPY WITH ESOPHAGOGASTRODUODENOSCOPY (EGD)  06/22/2016  . COLONOSCOPY WITH PROPOFOL N/A 01/16/2017   Procedure: COLONOSCOPY WITH PROPOFOL;  Surgeon: Ladene Artist, MD;  Location: [facility];  Service: Endoscopy;  Laterality: N/A;  . ENTEROSCOPY  01/26/2012   Procedure: ENTEROSCOPY;  Surgeon: Gatha Mayer, MD;  Location: [facility];  Service: Endoscopy;  Laterality: N/A;  . ENTEROSCOPY N/A 03/25/2015   Procedure: ENTEROSCOPY;  Surgeon: Doran Stabler, MD;  Location: [facility];  Service: Endoscopy;  Laterality: N/A;  . ENTEROSCOPY N/A 06/22/2016   Procedure: ENTEROSCOPY;  Surgeon: Jerene Bears, MD;  Location: [facility];  Service: Endoscopy;  Laterality: N/A;  . ESOPHAGOGASTRODUODENOSCOPY (EGD) WITH PROPOFOL N/A 06/22/2016   Procedure: ESOPHAGOGASTRODUODENOSCOPY (EGD) WITH PROPOFOL;  Surgeon: Jerene Bears, MD;  Location: [facility] ENDOSCOPY;  Service: Endoscopy;  Laterality: N/A;  . GIVENS CAPSULE STUDY N/A 01/13/2017   Procedure: GIVENS CAPSULE STUDY;  Surgeon: Mauri Pole, MD;  Location: [facility];  Service: Endoscopy;  Laterality: N/A;  . ILIAC ARTERY STENT     left  . SHOULDER ARTHROSCOPY    . TONSILLECTOMY       reports that she has been smoking cigarettes.  She has a 36.00 pack-year smoking history. she has never used smokeless tobacco. She reports that she does not drink alcohol or use drugs.  Allergies  Allergen Reactions  . Feraheme [Ferumoxytol] Anaphylaxis    hives    Family History  Problem Relation Age of Onset  . Hypertension Mother   . Heart attack Mother   . Hypertension Father   . Angina Father   . Cancer Sister   . Cancer Brother   . Stroke Neg Hx      Prior to Admission medications   Medication Sig Start Date End Date Taking? Authorizing Provider  acetaminophen (TYLENOL) 325  MG tablet Take 325 mg by mouth every 6 (six) hours as needed (for pain.).    Yes [provider]  atorvastatin (LIPITOR) 80 MG tablet TAKE 1 TABLET BY MOUTH  DAILY AT 6 PM. 08/12/16  Yes Amao, Charlane Ferretti, MD  bisacodyl (DULCOLAX) 10 MG suppository Place 10 mg rectally daily as needed for moderate constipation (for constipation.).   Yes [provider]  carvedilol (COREG) 6.25 MG tablet TAKE 1 TABLET BY MOUTH TWO  TIMES DAILY WITH A MEAL Patient taking differently: Take 6.25 mg by mouth 2 (two) times daily. (0900 & 1700) 09/13/16  Yes Amao, Enobong, MD  donepezil (ARICEPT) 10 MG tablet Take 1 tablet (10 mg total) by mouth at bedtime. Patient taking differently: Take 10 mg by mouth at bedtime. (2100) 06/15/16  Yes Arnoldo Morale, MD  ferrous sulfate 325 (65 FE) MG tablet Take 1 tablet (325 mg total) by mouth 2 (two)  times daily with a meal. Patient taking differently: Take 325 mg by mouth 2 (two) times daily with a meal. (0900 & 1700) 06/15/16  Yes Amao, Enobong, MD  fish oil-omega-3 fatty acids 1000 MG capsule Take 1 g by mouth daily. (0900)   Yes [provider]  folic acid (FOLVITE) 1 MG tablet Take 1 tablet (1 mg total) by mouth daily. Patient taking differently: Take 1 mg by mouth daily. (0900) 01/18/17  Yes Thurnell Lose, MD  furosemide (LASIX) 20 MG tablet Take 20 mg by mouth 2 (two) times daily. (0900 & 2100)   Yes [provider]  furosemide (LASIX) 40 MG tablet Take 60 mg by mouth 2 (two) times daily.   Yes [provider]  hydrALAZINE (APRESOLINE) 50 MG tablet TAKE 1 TABLET BY MOUTH 3  TIMES DAILY Patient taking differently: TAKE 1 TABLET BY MOUTH 3  TIMES DAILY (0900,1300, 2100) 12/20/16  Yes Jegede, Olugbemiga E, MD  magnesium hydroxide (MILK OF MAGNESIA) 400 MG/5ML suspension Take 30 mLs by mouth daily as needed (for constipation.).   Yes [provider]  nitroGLYCERIN (NITROSTAT) 0.4 MG SL tablet Place 0.4 mg under the tongue every 5  (five) minutes as needed for chest pain. X 3 doses   Yes [provider]  NUTRITIONAL SUPPLEMENT LIQD Take 120 mLs by mouth 2 (two) times daily. MedPass (0900 & 1700)   Yes [provider]  pantoprazole (PROTONIX) 40 MG tablet TAKE 1 TABLET BY MOUTH  DAILY Patient taking differently: TAKE 1 TABLET BY MOUTH  DAILY AT 0630 11/22/16  Yes Arnoldo Morale, MD  promethazine (PHENERGAN) 25 MG tablet Take 25 mg by mouth every 6 (six) hours as needed for nausea or vomiting. x3 doses   Yes [provider]  sertraline (ZOLOFT) 50 MG tablet Take 1 tablet (50 mg total) by mouth daily. Patient taking differently: Take 50 mg by mouth daily. (0900) 08/12/16  Yes Arnoldo Morale, MD  traMADol (ULTRAM) 50 MG tablet Take 50 mg by mouth every 12 (twelve) hours as needed for moderate pain.    Yes [provider]    Physical Exam: Vitals:   02/17/17 1850 02/17/17 1900 02/17/17 1930 02/17/17 2000  BP:  (!) 169/81 (!) 167/86 (!) 148/79  Pulse:  (!) 59 64 (!) 54  Resp:  16 15 13   Temp:      TempSrc:      SpO2:  97% 97% 93%  Weight: 56.7 kg (125 lb)     Height: 4\' 11"  (1.499 m)       Constitutional: NAD, calm, comfortable Eyes: PERRL, lids and conjunctivae normal ENMT: Mucous membranes are moist. Posterior pharynx clear of any exudate or lesions.Normal dentition.  Neck: normal, supple, no masses, no thyromegaly Respiratory: clear to auscultation bilaterally, no wheezing, no crackles. Normal respiratory effort. No accessory muscle use.  Cardiovascular: Regular rate and rhythm, no murmurs / rubs / gallops. No extremity edema. 2+ pedal pulses. No carotid bruits.  Abdomen: no tenderness, no masses palpated. No hepatosplenomegaly. Bowel sounds positive.  Musculoskeletal: no clubbing / cyanosis. No joint deformity upper and lower extremities. Good ROM, no contractures. Normal muscle tone.  Skin: no rashes, lesions, ulcers. No induration Neurologic: CN 2-12 grossly intact. Sensation  intact, DTR normal. Strength 5/5 in all 4.  Psychiatric: Normal judgment and insight. Alert and oriented x 3. Normal mood.    Labs on Admission: I have personally reviewed following labs and imaging studies  CBC: Recent Labs  Lab 02/15/17 02/17/17  1835  WBC 12.7 11.3*  NEUTROABS 11 9.7*  HGB 7.7* 7.2*  HCT 24* 22.7*  MCV  --  94.6  PLT 262 270   Basic Metabolic Panel: Recent Labs  Lab 02/15/17 02/17/17 1835  NA 144 141  K 4.1 3.7  CL  --  114*  CO2  --  16*  GLUCOSE  --  148*  BUN 67* 65*  CREATININE 4.2* 3.78*  CALCIUM  --  8.4*   GFR: Estimated Creatinine Clearance: 11.2 mL/min (A) (by C-G formula based on SCr of 3.78 mg/dL (H)). Liver Function Tests: Recent Labs  Lab 02/15/17 02/17/17 1835  AST 18 24  ALT 9 13*  ALKPHOS 83 65  BILITOT  --  0.6  PROT  --  5.6*  ALBUMIN  --  2.0*   No results for input(s): LIPASE, AMYLASE in the last 168 hours. No results for input(s): AMMONIA in the last 168 hours. Coagulation Profile: Recent Labs  Lab 02/17/17 1835  INR 1.13   Cardiac Enzymes: No results for input(s): CKTOTAL, CKMB, CKMBINDEX, TROPONINI in the last 168 hours. BNP (last 3 results) No results for input(s): PROBNP in the last 8760 hours. HbA1C: No results for input(s): HGBA1C in the last 72 hours. CBG: No results for input(s): GLUCAP in the last 168 hours. Lipid Profile: No results for input(s): CHOL, HDL, LDLCALC, TRIG, CHOLHDL, LDLDIRECT in the last 72 hours. Thyroid Function Tests: No results for input(s): TSH, T4TOTAL, FREET4, T3FREE, THYROIDAB in the last 72 hours. Anemia Panel: Recent Labs    02/17/17 1835  RETICCTPCT 2.2   Urine analysis:    Component Value Date/Time   COLORURINE YELLOW 01/11/2017 0545   APPEARANCEUR CLOUDY (A) 01/11/2017 0545   LABSPEC 1.010 01/11/2017 0545   PHURINE 5.0 01/11/2017 0545   GLUCOSEU NEGATIVE 01/11/2017 0545   HGBUR NEGATIVE 01/11/2017 0545   BILIRUBINUR NEGATIVE 01/11/2017 0545   BILIRUBINUR  negative 08/11/2015 1513   [place] 01/11/2017 0545   PROTEINUR NEGATIVE 01/11/2017 0545   UROBILINOGEN 0.2 08/11/2015 1513   UROBILINOGEN 0.2 01/08/2015 1215   NITRITE NEGATIVE 01/11/2017 0545   LEUKOCYTESUR LARGE (A) 01/11/2017 0545    Radiological Exams on Admission: No results found.  EKG: Independently reviewed.  Assessment/Plan Principal Problem:   Symptomatic anemia Active Problems:   Diet-controlled diabetes mellitus (HCC)   Iron deficiency anemia   Stage 4 chronic kidney disease (HCC)   Chronic diastolic CHF (congestive heart failure) (HCC)   Essential hypertension   Dementia   Right renal mass   Metabolic acidosis, NAG, failure of bicarbonate regeneration    1. Symptomatic anemia - likely chronic ongoing blood loss and iron deficiency 1. Anemia pnl to check iron levels (but dont give IV iron, got anaphylaxis to this last month). 2. Transfusion 1 unit PRBC 3. Repeat CBC in AM 4. Hemoccult neg but no stool in vault 5. Stop ASA 6. May wish to call GI in AM depending on response to transfusion. 2. NAG metabolic acidosis - 1. In setting of CKD stage 4 2. Also see that patient had this initially on last admission as well 3. Will put patient on PO bicarb 650mg  BID to start with 4. Repeat BMP in AM 3. CKD stage 4 - baseline creat ~3.0 1. Had worsening a couple of days ago with creat up to 4.2 2. Dr. Linna Darner reduced Lasix to 20mg  BID (from 60mg  BID) for 1 week 3. Creat down to 3.7 today 4. Will leave her on the 20mg  BID dosing  for now 5. With the note that she is getting transfusion so will have to see how she responds to that (may need more). 6. Patient tells me she DOES want dialysis if needed 7. Will get US renal to make sure no hydro given that CAT scan showed possible mass causing mild caliectasis of R renal pelvis back at end of October, unchanged end of Nov, has cystoscopy scheduled on 02/27/17 with Dr. Gloriann Loan. 8. Get nephrology consult in AM 4. HTN -  continue home BP meds 5. Diet controlled DM - continue diet control 6. CHF - see above for lasix discussion 7. Dementia - chronic and likely baseline  DVT prophylaxis: SCDs Code Status: DNR Family Communication: No family in room Disposition Plan: SNF after admit Consults called: None Admission status: Place in obs   GARDNER, [place] [facility] Pager [phone]  If 7AM-7PM, please contact day team taking care of patient [email or web address] Password [facility]  02/17/2017, 10:29 PM

## 2017-02-17 NOTE — ED Provider Notes (Signed)
[facility] DEPT Provider Note   CSN: [id number] Arrival date & time: 02/17/17  [place]     History   Chief Complaint Chief Complaint  Patient presents with  . Abnormal Lab    HPI [name] is a 66 y.o. female.  HPI   66 year old female presents today from [place] living in rehab with symptomatic anemia.  She notes several months of fatigue, dizziness upon standing and shortness of breath with ambulation.  Patient has a significant past medical history of the same and is followed by lumbar gastroenterology.  Previous GI bleed thought to be secondary to NSAID related erosions and small and large bowel which were found on capsule and the study and colonoscopy with biopsies.  Patient most recently seen on 02/01/2017 with reported baseline hemoglobin in the 9-10 range; hemoglobin at that time 7.9.   Past Medical History:  Diagnosis Date  . Abscess, groin 12/2016  . Anemia, chronic disease 05/16/2008   Qualifier: Diagnosis of  By: Percival Spanish, MD, Farrel Gordon    . Anxiety   . Bradycardia    a. Limiting BB titration.  [name] CAD (coronary artery disease)    a. NSTEMI 2007 in setting of anemia; LHC 50% LM, 60% LAD, 30% RI, occluded RCA with L-R collaterals, normal EF, treated medically; B. LHC 01/2015 prox-mid RCA 100%, ostial to prox LAD 70%, LM 50%, mid-distal circ 90%-not surgical candidate due to renal and pulmonary issues).  . Carotid artery disease ([place])    a. Duplex 12/2014: left CEA patent with elevated velocities, 60-79% RICA (stable over serial exams), 93-26% LICA stenosis (stable over serial exams), 50% LECA, elevated velocities in bilateral subclavian arteries.  . Chronic combined systolic (congestive) and diastolic (congestive) heart failure ([place])    a. 06/2016 Echo: EF 40-45%, mod MR, mildly dil LA/RA, mod TR, PASP 1mmHg.  . CKD (chronic kidney disease), stage IV ([place])   . COPD (chronic obstructive pulmonary disease) (HCC)    home oxygen  .  Dementia   . Dementia   . Depression   . Diabetes mellitus    type 2  . Diabetic retinopathy ([place])   . Essential hypertension   . GI AVM (gastrointestinal arteriovenous vascular malformation)    small bowel  . Hematuria   . Hyperlipidemia   . Hypertensive heart disease with congestive heart failure ([place])   . Iron deficiency anemia    a. ? chronic GI blood loss-->plavix d/c'd 06/2016.  . Muscle weakness   . Other abnormalities of gait and mobility   . Peripheral vascular disease ([place])    a.s /p left common iliac and external iliac artery stents. b. H/o subclavian, mesenteric, and celiac artery stenosis (see below).  . Protein-calorie malnutrition ([place]) 01/24/2015  . Pulmonary hypertension ([place])    a. 06/2016 Echo: PASP 32mmHg.  [name] Renal artery atherosclerosis ([place])   . Renal artery stenosis ([place])    a. Duplex 2014: no obvious evidence of hemodynamically significant stenosis >60%, bilateral intrarenal arteries exhibit absent diastolic flow. There is evidence of elevated velocities of the mid aorta. 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  . Tobacco abuse   . 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 Dieaulafoy's lesion 01/25/2012    Patient Active Problem List   Diagnosis Date Noted  . Metabolic acidosis, NAG, failure of bicarbonate regeneration 02/17/2017  . Benign neoplasm of transverse colon   . Anaphylaxis 01/12/2017  . Acute-on-chronic kidney injury ([place]) 01/10/2017  . Right renal mass  12/30/2016  . Caliectasis 12/27/2016  . Anemia 12/27/2016  . Skin breakdown 12/27/2016  . Boil 12/27/2016  . Hematuria 12/27/2016  . Abscess of groin   . Bilateral carotid artery stenosis 08/22/2016  . Pulmonary nodule 06/29/2016  . Acute kidney injury superimposed on chronic kidney disease ([place]) 06/23/2016  . Acute on chronic combined systolic and diastolic CHF (congestive heart failure) ([place]) 06/23/2016  . Hypokalemia 06/23/2016  . Acute esophagitis   .  Angiodysplasia of duodenum   . Symptomatic anemia 06/21/2016  . Cardiac enzymes elevated   . Elevated liver enzymes   . Transaminitis   . Demand ischemia ([place])   . Coffee ground emesis   . Occult blood in stools   . Dementia 03/04/2016  . CAP (community acquired pneumonia) 05/13/2015  . Indwelling catheter present on admission 04/21/2015  . Difficulty breathing   . Pulmonary infiltrates   . Staphylococcus aureus bacteremia   . Palliative care encounter   . Malnutrition of moderate degree 03/30/2015  . HCAP (healthcare-associated pneumonia) 03/29/2015  . Acute respiratory failure ([place]) 03/29/2015  . Hypomagnesemia 03/29/2015  . Low serum vitamin D 03/29/2015  . Coronary artery disease involving native coronary artery of native heart with angina pectoris ([place])   . Sacral ulcer ([place])   . Urinary retention   . Hypocalcemia 03/20/2015  . Lewy body dementia without behavioral disturbance   . AKI (acute kidney injury) ([place])   . Protein-calorie malnutrition ([place]) 01/24/2015  . Abnormal nuclear stress test   . Stage 4 chronic kidney disease ([place])   . Carotid artery disease ([place])   . CAD (coronary artery disease)   . Chronic diastolic CHF (congestive heart failure) ([place])   . Essential hypertension   . Former tobacco use   . Bradycardia   . UTI (urinary tract infection) 01/27/2012  . Nausea & vomiting 01/26/2012  . 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 Diet-controlled diabetes mellitus ([place]) 01/25/2012  . Iron deficiency anemia 01/25/2012  . Renal artery stenosis ([place]) 11/19/2010  . Peripheral vascular disease ([place]) 11/19/2010  . Diabetes mellitus with complication ([place]) 14/43/1540  . Hyperlipidemia 05/16/2008  . OBESITY 05/16/2008  . Anemia, chronic disease 05/16/2008  . ANXIETY DEPRESSION 05/16/2008  . History of CVA (cerebrovascular accident) 05/16/2008  . Chronic obstructive pulmonary disease ([place]) 05/16/2008  . DIVERTICULITIS, HX OF  05/16/2008    Past Surgical History:  Procedure Laterality Date  . APPENDECTOMY    . CARDIAC CATHETERIZATION N/A 01/23/2015   Procedure: Left Heart Cath and Coronary Angiography;  Surgeon: [name], MD;  Location: [facility];  Service: Cardiovascular;  Laterality: N/A;  . CAROTID ENDARTERECTOMY    . CESAREAN SECTION    . CHOLECYSTECTOMY    . COLONOSCOPY WITH ESOPHAGOGASTRODUODENOSCOPY (EGD)  06/22/2016  . COLONOSCOPY WITH PROPOFOL N/A 01/16/2017   Procedure: COLONOSCOPY WITH PROPOFOL;  Surgeon: Ladene Artist, MD;  Location: [facility];  Service: Endoscopy;  Laterality: N/A;  . ENTEROSCOPY  01/26/2012   Procedure: ENTEROSCOPY;  Surgeon: Gatha Mayer, MD;  Location: [facility];  Service: Endoscopy;  Laterality: N/A;  . ENTEROSCOPY N/A 03/25/2015   Procedure: ENTEROSCOPY;  Surgeon: Doran Stabler, MD;  Location: [facility];  Service: Endoscopy;  Laterality: N/A;  . ENTEROSCOPY N/A 06/22/2016   Procedure: ENTEROSCOPY;  Surgeon: Jerene Bears, MD;  Location[facility];  Service: Endoscopy;  Laterality: N/A;  . ESOPHAGOGASTRODUODENOSCOPY (EGD) WITH PROPOFOL N/A 06/22/2016   Procedure: ESOPHAGOGASTRODUODENOSCOPY (EGD) WITH PROPOFOL;  Surgeon: Jerene Bears, MD;  Location: [facility] ENDOSCOPY;  Service: Endoscopy;  Laterality: N/A;  . GIVENS CAPSULE STUDY N/A 01/13/2017   Procedure: GIVENS CAPSULE STUDY;  Surgeon: Mauri Pole, MD;  Location: [facility] ENDOSCOPY;  Service: Endoscopy;  Laterality: N/A;  . ILIAC ARTERY STENT     left  . SHOULDER ARTHROSCOPY    . TONSILLECTOMY      OB History    No data available       Home Medications    Prior to Admission medications   Medication Sig Start Date End Date Taking? Authorizing Provider  acetaminophen (TYLENOL) 325 MG tablet Take 325 mg by mouth every 6 (six) hours as needed (for pain.).    Yes [provider]  atorvastatin (LIPITOR) 80 MG tablet TAKE 1 TABLET BY MOUTH  DAILY AT 6 PM. 08/12/16  Yes Amao, Charlane Ferretti,  MD  bisacodyl (DULCOLAX) 10 MG suppository Place 10 mg rectally daily as needed for moderate constipation (for constipation.).   Yes [provider]  carvedilol (COREG) 6.25 MG tablet TAKE 1 TABLET BY MOUTH TWO  TIMES DAILY WITH A MEAL Patient taking differently: Take 6.25 mg by mouth 2 (two) times daily. (0900 & 1700) 09/13/16  Yes Amao, Enobong, MD  donepezil (ARICEPT) 10 MG tablet Take 1 tablet (10 mg total) by mouth at bedtime. Patient taking differently: Take 10 mg by mouth at bedtime. (2100) 06/15/16  Yes Arnoldo Morale, MD  ferrous sulfate 325 (65 FE) MG tablet Take 1 tablet (325 mg total) by mouth 2 (two) times daily with a meal. Patient taking differently: Take 325 mg by mouth 2 (two) times daily with a meal. (0900 & 1700) 06/15/16  Yes Amao, Charlane Ferretti, MD  fish oil-omega-3 fatty acids 1000 MG capsule Take 1 g by mouth daily. (0900)   Yes [provider]  folic acid (FOLVITE) 1 MG tablet Take 1 tablet (1 mg total) by mouth daily. Patient taking differently: Take 1 mg by mouth daily. (0900) 01/18/17  Yes Thurnell Lose, MD  furosemide (LASIX) 20 MG tablet Take 20 mg by mouth 2 (two) times daily. (0900 & 2100)   Yes [provider]  furosemide (LASIX) 40 MG tablet Take 60 mg by mouth 2 (two) times daily.   Yes [provider]  hydrALAZINE (APRESOLINE) 50 MG tablet TAKE 1 TABLET BY MOUTH 3  TIMES DAILY Patient taking differently: TAKE 1 TABLET BY MOUTH 3  TIMES DAILY (0900,1300, 2100) 12/20/16  Yes Jegede, Olugbemiga E, MD  magnesium hydroxide (MILK OF MAGNESIA) 400 MG/5ML suspension Take 30 mLs by mouth daily as needed (for constipation.).   Yes [provider]  nitroGLYCERIN (NITROSTAT) 0.4 MG SL tablet Place 0.4 mg under the tongue every 5 (five) minutes as needed for chest pain. X 3 doses   Yes [provider]  NUTRITIONAL SUPPLEMENT LIQD Take 120 mLs by mouth 2 (two) times daily. MedPass (0900 & 1700)   Yes [provider]    pantoprazole (PROTONIX) 40 MG tablet TAKE 1 TABLET BY MOUTH  DAILY Patient taking differently: TAKE 1 TABLET BY MOUTH  DAILY AT 0630 11/22/16  Yes Arnoldo Morale, MD  promethazine (PHENERGAN) 25 MG tablet Take 25 mg by mouth every 6 (six) hours as needed for nausea or vomiting. x3 doses   Yes [provider]  sertraline (ZOLOFT) 50 MG tablet Take 1 tablet (50 mg total) by mouth daily. Patient taking differently: Take 50 mg by mouth daily. (0900) 08/12/16  Yes Amao, Enobong,  MD  traMADol (ULTRAM) 50 MG tablet Take 50 mg by mouth every 12 (twelve) hours as needed for moderate pain.    Yes [provider]    Family History Family History  Problem Relation Age of Onset  . Hypertension Mother   . Heart attack Mother   . Hypertension Father   . Angina Father   . Cancer Sister   . Cancer Brother   . Stroke Neg Hx     Social History Social History   Tobacco Use  . Smoking status: Current Every Day Smoker    Packs/day: 1.00    Years: 36.00    Pack years: 36.00    Types: Cigarettes  . Smokeless tobacco: Never Used  . Tobacco comment: Is currently in a smoke-free SNF 12/2016  Substance Use Topics  . Alcohol use: No    Alcohol/week: 0.0 oz    Frequency: Never  . Drug use: No     Allergies   Feraheme [ferumoxytol]   Review of Systems Review of Systems  All other systems reviewed and are negative.    Physical Exam Updated Vital Signs BP (!) 148/79   Pulse (!) 54   Temp 98.5 F (36.9 C) (Oral)   Resp 13   Ht 4\' 11"  (1.499 m)   Wt 56.7 kg (125 lb)   SpO2 93%   BMI 25.25 kg/m   Physical Exam  Constitutional: She is oriented to person, place, and time. She appears well-developed and well-nourished.  HENT:  Head: Normocephalic and atraumatic.  Eyes: Conjunctivae are normal. Pupils are equal, round, and reactive to light. Right eye exhibits no discharge. Left eye exhibits no discharge. No scleral icterus.  Neck: Normal range of motion. No JVD present.  No tracheal deviation present.  Cardiovascular: Normal rate and regular rhythm.  Pulmonary/Chest: Effort normal and breath sounds normal. No stridor. No respiratory distress.  Abdominal: Soft. She exhibits no distension. There is no tenderness.  Musculoskeletal: She exhibits no edema.  Neurological: She is alert and oriented to person, place, and time. Coordination normal.  Skin: Skin is warm.  Psychiatric: She has a normal mood and affect. Her behavior is normal. Judgment and thought content normal.  Nursing note and vitals reviewed.   ED Treatments / Results  Labs (all labs ordered are listed, but only abnormal results are displayed) Labs Reviewed  CBC WITH DIFFERENTIAL/PLATELET - Abnormal; Notable for the following components:      Result Value   WBC 11.3 (*)    RBC 2.40 (*)    Hemoglobin 7.2 (*)    HCT 22.7 (*)    Neutro Abs 9.7 (*)    All other components within normal limits  COMPREHENSIVE METABOLIC PANEL - Abnormal; Notable for the following components:   Chloride 114 (*)    CO2 16 (*)    Glucose, Bld 148 (*)    BUN 65 (*)    Creatinine, Ser 3.78 (*)    Calcium 8.4 (*)    Total Protein 5.6 (*)    Albumin 2.0 (*)    ALT 13 (*)    GFR calc non Af Amer 11 (*)    GFR calc Af Amer 13 (*)    All other components within normal limits  RETICULOCYTES - Abnormal; Notable for the following components:   RBC. 2.36 (*)    All other components within normal limits  PROTIME-INR  VITAMIN B12  FOLATE  IRON AND TIBC  FERRITIN  ERYTHROPOIETIN  BASIC METABOLIC PANEL  CBC  POC OCCULT BLOOD, ED  POC OCCULT BLOOD, ED  TYPE AND SCREEN  PREPARE RBC (CROSSMATCH)    EKG  EKG Interpretation None       Radiology No results found.  Procedures Procedures (including critical care time)  Medications Ordered in ED Medications  atorvastatin (LIPITOR) tablet 80 mg (not administered)  bisacodyl (DULCOLAX) suppository 10 mg (not administered)  carvedilol (COREG) tablet 6.25 mg  (not administered)  donepezil (ARICEPT) tablet 10 mg (not administered)  ferrous sulfate tablet 325 mg (not administered)  folic acid (FOLVITE) tablet 1 mg (not administered)  hydrALAZINE (APRESOLINE) tablet 50 mg (not administered)  magnesium hydroxide (MILK OF MAGNESIA) suspension 30 mL (not administered)  NUTRITIONAL SUPPLEMENT LIQD 120 mL (not administered)  sertraline (ZOLOFT) tablet 50 mg (not administered)  pantoprazole (PROTONIX) EC tablet 40 mg (not administered)  traMADol (ULTRAM) tablet 50 mg (not administered)  furosemide (LASIX) tablet 20 mg (not administered)  0.9 %  sodium chloride infusion (not administered)  sodium bicarbonate tablet 650 mg (not administered)  acetaminophen (TYLENOL) tablet 650 mg (not administered)    Or  acetaminophen (TYLENOL) suppository 650 mg (not administered)  ondansetron (ZOFRAN) tablet 4 mg (not administered)    Or  ondansetron (ZOFRAN) injection 4 mg (not administered)     Initial Impression / Assessment and Plan / ED Course  I have reviewed the triage vital signs and the nursing notes.  Pertinent labs & imaging results that were available during my care of the patient were reviewed by me and considered in my medical decision making (see chart for details).    Final Clinical Impressions(s) / ED Diagnoses   Final diagnoses:  Symptomatic anemia    Labs: Type and screen, CBC, CMP  Imaging:  Consults:  Therapeutics:  Discharge Meds:   Assessment/Plan: 66 year old female presents today with symptomatic anemia.  Patient's hemoglobin is 7.2 here today.  This continues to drop from 2 days ago or 7.7.  She does have some baseline anemia, this was likely secondary to gastrointestinal losses at her last hospitalization seen by lower GI.  Due to continued symptomatic anemia, continued drop in hemoglobin patient will need transfusion hospitalization for ongoing management.    ED Discharge Orders    None       Francee Gentile 02/17/17 2309    Malvin Johns, MD 02/17/17 [phone]

## 2017-02-17 NOTE — ED Notes (Signed)
Please call Chloe for report at [phone] at [phone]

## 2017-02-17 NOTE — Progress Notes (Signed)
Spoke with poa spouse sonda coppens and he will arrive 800 am 02-27-17 [place] admitting to sign for patient procedure.

## 2017-02-17 NOTE — Progress Notes (Signed)
Spoke with Network engineer for [employer] faxed instructions for [employer] procedure 02-27-17 received and given to [place].

## 2017-02-17 NOTE — Progress Notes (Signed)
Preop instructions for: [name]  Date of Birth   04/15/1950                         Date of Procedure: 02-27-2017    Doctor: Gloriann Loan Time to arrive at [facility]: 800 AM Report to: Admitting  Procedure: CYSTOSCOPY WITH BILATERAL RETROGRADE PYELOGRAM  Do not eat or drink past midnight the night before your procedure.(To include any tube feedings-must be discontinued)  TAKE NO MEDICATIONS MORNING OF PROCEDURE DUE TO PATIENT TAKES MEDICATIONS CRUSHED.  Facility contact:  [facility]   Phone: [phone] Health Care POA: KARLE DESROSIER PHONE [phone] POA SIGNS CONSENTS  Transportation contact phone#: Kern Reap [phone] FACILITY TO [place]  Please send day of procedure:current med list and meds last taken that day, confirm nothing by mouth status from what time, Patient Demographic info( to include DNR status, problem list, allergies)    Bring Insurance card and picture ID Leave all jewelry and other valuables at place where living( no metal or rings to be worn) No contact lens Women-no make-up, no lotions,perfumes,powders Men-no colognes,lotions  Any questions day of procedure,call SHORT STAY UNIT unit-[phone]!   Sent from :[facility]                   [place]                   [phone]  Sent by :Zelphia Cairo RN

## 2017-02-17 NOTE — ED Triage Notes (Signed)
Patient brought in by EMS from [facility] with c/o low hemoglobin of 7, per report. Patient recently complained of right leg pain upon standing. Patient has history of dementia, renal failure, hypokalemia, type II DM, anemia, and TIA. Patient pale in triage.

## 2017-02-18 ENCOUNTER — Other Ambulatory Visit: Payer: Self-pay

## 2017-02-18 DIAGNOSIS — F0390 Unspecified dementia without behavioral disturbance: Secondary | ICD-10-CM | POA: Diagnosis not present

## 2017-02-18 DIAGNOSIS — D649 Anemia, unspecified: Secondary | ICD-10-CM

## 2017-02-18 DIAGNOSIS — N281 Cyst of kidney, acquired: Secondary | ICD-10-CM | POA: Diagnosis not present

## 2017-02-18 DIAGNOSIS — N184 Chronic kidney disease, stage 4 (severe): Secondary | ICD-10-CM | POA: Diagnosis not present

## 2017-02-18 DIAGNOSIS — I10 Essential (primary) hypertension: Secondary | ICD-10-CM | POA: Diagnosis not present

## 2017-02-18 DIAGNOSIS — E872 Acidosis: Secondary | ICD-10-CM | POA: Diagnosis not present

## 2017-02-18 LAB — CBC
HCT: 24.6 % — ABNORMAL LOW (ref 36.0–46.0)
HEMOGLOBIN: 8.2 g/dL — AB (ref 12.0–15.0)
MCH: 30.6 pg (ref 26.0–34.0)
MCHC: 33.3 g/dL (ref 30.0–36.0)
MCV: 91.8 fL (ref 78.0–100.0)
PLATELETS: 247 10*3/uL (ref 150–400)
RBC: 2.68 MIL/uL — ABNORMAL LOW (ref 3.87–5.11)
RDW: 15.9 % — AB (ref 11.5–15.5)
WBC: 10.2 10*3/uL (ref 4.0–10.5)

## 2017-02-18 LAB — IRON AND TIBC
Iron: 24 ug/dL — ABNORMAL LOW (ref 28–170)
Saturation Ratios: 15 % (ref 10.4–31.8)
TIBC: 164 ug/dL — ABNORMAL LOW (ref 250–450)
UIBC: 140 ug/dL

## 2017-02-18 LAB — BASIC METABOLIC PANEL
ANION GAP: 11 (ref 5–15)
BUN: 65 mg/dL — ABNORMAL HIGH (ref 6–20)
CALCIUM: 8.3 mg/dL — AB (ref 8.9–10.3)
CO2: 17 mmol/L — AB (ref 22–32)
CREATININE: 3.54 mg/dL — AB (ref 0.44–1.00)
Chloride: 113 mmol/L — ABNORMAL HIGH (ref 101–111)
GFR calc Af Amer: 14 mL/min — ABNORMAL LOW (ref >60)
GFR, EST NON AFRICAN AMERICAN: 12 mL/min — AB (ref >60)
GLUCOSE: 79 mg/dL (ref 65–99)
Potassium: 3.7 mmol/L (ref 3.5–5.1)
Sodium: 141 mmol/L (ref 135–145)

## 2017-02-18 LAB — FOLATE: Folate: 33 ng/mL (ref >5.9)

## 2017-02-18 LAB — HEMOGLOBIN A1C
HEMOGLOBIN A1C: 5.2 % (ref 4.8–5.6)
MEAN PLASMA GLUCOSE: 102.54 mg/dL

## 2017-02-18 LAB — GLUCOSE, CAPILLARY
GLUCOSE-CAPILLARY: 100 mg/dL — AB (ref 65–99)
GLUCOSE-CAPILLARY: 94 mg/dL (ref 65–99)
Glucose-Capillary: 99 mg/dL (ref 65–99)

## 2017-02-18 LAB — VITAMIN B12: Vitamin B-12: 541 pg/mL (ref 180–914)

## 2017-02-18 LAB — MRSA PCR SCREENING: MRSA BY PCR: NEGATIVE

## 2017-02-18 LAB — FERRITIN: Ferritin: 326 ng/mL — ABNORMAL HIGH (ref 11–307)

## 2017-02-18 MED ORDER — INSULIN ASPART 100 UNIT/ML ~~LOC~~ SOLN: 0.0000 [IU] | Freq: Three times a day (TID) | SUBCUTANEOUS | Status: DC

## 2017-02-18 MED ORDER — ORAL CARE MOUTH RINSE
15.0000 mL | Freq: Two times a day (BID) | OROMUCOSAL | Status: DC
  Administered 2017-02-18 (×3): 15 mL via OROMUCOSAL

## 2017-02-18 MED ORDER — INSULIN ASPART 100 UNIT/ML ~~LOC~~ SOLN: 0.0000 [IU] | Freq: Every day | SUBCUTANEOUS | Status: DC

## 2017-02-18 NOTE — Progress Notes (Signed)
[facility]  PROGRESS NOTE  [name] CBJ:[id number] DOB: [birth year] DOA: 02/17/2017 PCP: Arnoldo Morale, MD   Brief HPI:    66 y.o. female with medical history significant of CHF with EF 40-45%, CKD stage 4, HTN, CAD, dementia, COPD.  Patient sent in to ED with worsening symptomatic anemia.  Patient was recently admitted last month to hospital for symptomatic anemia due to iron def probably from chronic blood loss / chronic GIB due to small and large bowel erosions seen on colonoscopy / capsule study, felt possibly to be due to ASA/NSAIDs, also has h/o AVMs.  Patient was discharged.  HGB initially steady in Dec, but has dropped over last couple of days on lab work.  Creatinine also has increased from 2.9 to mid 3s since discharge.     Subjective   Patient seen and examined, denies shortness of breath. Patient says she fell at home, that's why she came to the hospital. Denies syncope.   Assessment/Plan:     Symptomatic anemia- likely from chronic ongoing blood loss and iron deficiency. Patient had a capsule endoscopy in November, revealed multiple small bowel erosions, aphthous ulcers and 1-2 ulcers in visualized portion of right colon. Colonoscopy  - terminal ileum appeared normal but biopsies were taken due to the abnormal capsule findings. A single localized nonbleeding erosion with mild surrounding erythema was found at the ileocecal valve. A 6 mm sessile polyp was removed from the transverse colon. Patient is status post 1 unit PRBC. Hemoglobin is 8.2 today. Will recheck CBC in a.m. We have stopped aspirin. Patient was advised not to take aspirin or NSAIDs during previous hospitalization.  CKD stage IV- patient's baseline creatinine is around 3.Had worsening a couple of days ago with creat up to 4.2 .Dr. Linna Darner reduced Lasix to 20mg  BID (from 60mg  BID) for 1 week. Creat down to 3.54 today. Continue with Lasix 20 mg by mouth twice a day.  NAG metabolic acidosis- started on  sodium bicarbonate tablets 650 mg bid  Hypertension-blood pressure stable, continue Coreg, Lasix.  Diabetes mellitus- start sliding scale insulin with NovoLog.  Dementia-chronic, no bladder disturbance. Stable. At baseline        DVT prophylaxis: SCDs  Code Status: DNRis  Family Communication: No family at bedside  Disposition Plan: SNF   Consultants:  None   Procedures:  None      Antibiotics:   Anti-infectives (From admission, onward)   None       Objective   Vitals:   02/18/17 0241 02/18/17 0550 02/18/17 1000 02/18/17 1300  BP: (!) 165/46 (!) 153/41 (!) 142/44 (!) 142/44  Pulse: 60 61  (!) 57  Resp: 16 18    Temp: 97.9 F (36.6 C) 98.1 F (36.7 C)  98 F (36.7 C)  TempSrc: Oral Oral  Oral  SpO2: 100% 97%  95%  Weight:      Height:        Intake/Output Summary (Last 24 hours) at 02/18/2017 1549 Last data filed at 02/18/2017 0944 Gross per 24 hour  Intake 675.33 ml  Output -  Net 675.33 ml   Filed Weights   02/17/17 1850 02/18/17 0142  Weight: 56.7 kg (125 lb) 56 kg (123 lb 7.3 oz)     Physical Examination:   Physical Exam: Eyes: No icterus, extraocular muscles intact  Mouth: Oral mucosa is moist, no lesions on palate,  Neck: Supple, no deformities, masses, or tenderness Lungs: Normal respiratory effort, bilateral clear to auscultation, no crackles or wheezes.  Heart: Regular rate and rhythm, S1 and S2 normal, no murmurs, rubs auscultated Abdomen: BS normoactive,soft,nondistended,non-tender to palpation,no organomegaly Extremities: No pretibial edema, no erythema, no cyanosis, no clubbing Neuro : Alert and oriented to time, place and person, No focal deficits  Skin: No rashes seen on exam     Data Reviewed: I have personally reviewed following labs and imaging studies  CBG: Recent Labs  Lab 02/18/17 0150 02/18/17 1222  GLUCAP 100* 94    CBC: Recent Labs  Lab 02/15/17 02/17/17 1835 02/18/17 0536  WBC 12.7 11.3*  10.2  NEUTROABS 11 9.7*  --   HGB 7.7* 7.2* 8.2*  HCT 24* 22.7* 24.6*  MCV  --  94.6 91.8  PLT 262 272 941    Basic Metabolic Panel: Recent Labs  Lab 02/15/17 02/17/17 1835 02/18/17 0536  NA 144 141 141  K 4.1 3.7 3.7  CL  --  114* 113*  CO2  --  16* 17*  GLUCOSE  --  148* 79  BUN 67* 65* 65*  CREATININE 4.2* 3.78* 3.54*  CALCIUM  --  8.4* 8.3*    Recent Results (from the past 240 hour(s))  MRSA PCR Screening     Status: None   Collection Time: 02/18/17  1:55 AM  Result Value Ref Range Status   MRSA by PCR NEGATIVE NEGATIVE Final    Comment:        The GeneXpert MRSA Assay (FDA approved for NASAL specimens only), is one component of a comprehensive MRSA colonization surveillance program. It is not intended to diagnose MRSA infection nor to guide or monitor treatment for MRSA infections.      Liver Function Tests: Recent Labs  Lab 02/15/17 02/17/17 1835  AST 18 24  ALT 9 13*  ALKPHOS 83 65  BILITOT  --  0.6  PROT  --  5.6*  ALBUMIN  --  2.0*   No results for input(s): LIPASE, AMYLASE in the last 168 hours. No results for input(s): AMMONIA in the last 168 hours.  Cardiac Enzymes: No results for input(s): CKTOTAL, CKMB, CKMBINDEX, TROPONINI in the last 168 hours. BNP (last 3 results) Recent Labs    06/21/16 1624  BNP >4,500.0*    ProBNP (last 3 results) No results for input(s): PROBNP in the last 8760 hours.    Studies: US Renal  Result Date: 02/18/2017 CLINICAL DATA:  Acute onset of renal insufficiency. EXAM: RENAL / URINARY TRACT ULTRASOUND COMPLETE COMPARISON:  CT of the abdomen and pelvis from 01/10/2017, and renal ultrasound performed 03/07/2015 FINDINGS: Right Kidney: Length: 10.1 cm. Diffusely increased parenchymal echogenicity is noted. A small 0.8 cm cyst is noted at the upper pole of the right kidney. Scattered vascular calcifications are noted at the right hilum, on comparison with prior CT. No mass or hydronephrosis visualized. Left  Kidney: Length: 0.8 cm. Very poorly characterized due to overlying bowel gas and limitations in positioning. Bladder: Appears normal for degree of bladder distention. IMPRESSION: 1. No evidence of hydronephrosis. Left kidney is very poorly characterized due to overlying bowel gas and limitations in positioning. 2. Diffusely increased renal parenchymal echogenicity on the right raises concern for medical renal disease. 3. Small right renal cyst noted. Electronically Signed   By: Garald Balding M.D.   On: 02/18/2017 00:25    Scheduled Meds: . atorvastatin  80 mg Oral q1800  . carvedilol  6.25 mg Oral BID  . donepezil  10 mg Oral QHS  . ferrous sulfate  325 mg Oral BID WC  .  folic acid  1 mg Oral Daily  . furosemide  20 mg Oral BID  . hydrALAZINE  50 mg Oral TID  . insulin aspart  0-9 Units Subcutaneous TID WC  . mouth rinse  15 mL Mouth Rinse BID  . pantoprazole  40 mg Oral Daily  . sertraline  50 mg Oral Daily  . sodium bicarbonate  650 mg Oral BID      Time spent: 25 min  [facility] Pager [phone]. If 7PM-7AM, please contact night-coverage at [email or web address], Office  [phone]  password [email or web address]  02/18/2017, 3:49 PM  LOS: 0 days

## 2017-02-18 NOTE — Plan of Care (Signed)
  Education: Knowledge of General Education information will improve 02/18/2017 0219 - Progressing by Ashley Murrain, RN

## 2017-02-19 DIAGNOSIS — I130 Hypertensive heart and chronic kidney disease with heart failure and stage 1 through stage 4 chronic kidney disease, or unspecified chronic kidney disease: Secondary | ICD-10-CM | POA: Diagnosis present

## 2017-02-19 DIAGNOSIS — I10 Essential (primary) hypertension: Secondary | ICD-10-CM | POA: Diagnosis not present

## 2017-02-19 DIAGNOSIS — E1151 Type 2 diabetes mellitus with diabetic peripheral angiopathy without gangrene: Secondary | ICD-10-CM | POA: Diagnosis present

## 2017-02-19 DIAGNOSIS — N184 Chronic kidney disease, stage 4 (severe): Secondary | ICD-10-CM | POA: Diagnosis not present

## 2017-02-19 DIAGNOSIS — E1122 Type 2 diabetes mellitus with diabetic chronic kidney disease: Secondary | ICD-10-CM | POA: Diagnosis present

## 2017-02-19 DIAGNOSIS — K551 Chronic vascular disorders of intestine: Secondary | ICD-10-CM | POA: Diagnosis present

## 2017-02-19 DIAGNOSIS — J449 Chronic obstructive pulmonary disease, unspecified: Secondary | ICD-10-CM | POA: Diagnosis present

## 2017-02-19 DIAGNOSIS — D649 Anemia, unspecified: Secondary | ICD-10-CM | POA: Diagnosis not present

## 2017-02-19 DIAGNOSIS — I6523 Occlusion and stenosis of bilateral carotid arteries: Secondary | ICD-10-CM | POA: Diagnosis present

## 2017-02-19 DIAGNOSIS — E46 Unspecified protein-calorie malnutrition: Secondary | ICD-10-CM | POA: Diagnosis present

## 2017-02-19 DIAGNOSIS — E872 Acidosis: Secondary | ICD-10-CM | POA: Diagnosis not present

## 2017-02-19 DIAGNOSIS — I739 Peripheral vascular disease, unspecified: Secondary | ICD-10-CM | POA: Diagnosis present

## 2017-02-19 DIAGNOSIS — E785 Hyperlipidemia, unspecified: Secondary | ICD-10-CM | POA: Diagnosis present

## 2017-02-19 DIAGNOSIS — I5042 Chronic combined systolic (congestive) and diastolic (congestive) heart failure: Secondary | ICD-10-CM | POA: Diagnosis present

## 2017-02-19 DIAGNOSIS — K920 Hematemesis: Secondary | ICD-10-CM | POA: Diagnosis not present

## 2017-02-19 DIAGNOSIS — I252 Old myocardial infarction: Secondary | ICD-10-CM | POA: Diagnosis not present

## 2017-02-19 DIAGNOSIS — D638 Anemia in other chronic diseases classified elsewhere: Secondary | ICD-10-CM | POA: Diagnosis present

## 2017-02-19 DIAGNOSIS — I774 Celiac artery compression syndrome: Secondary | ICD-10-CM | POA: Diagnosis present

## 2017-02-19 DIAGNOSIS — E11319 Type 2 diabetes mellitus with unspecified diabetic retinopathy without macular edema: Secondary | ICD-10-CM | POA: Diagnosis present

## 2017-02-19 DIAGNOSIS — F419 Anxiety disorder, unspecified: Secondary | ICD-10-CM | POA: Diagnosis present

## 2017-02-19 DIAGNOSIS — F0390 Unspecified dementia without behavioral disturbance: Secondary | ICD-10-CM | POA: Diagnosis present

## 2017-02-19 DIAGNOSIS — I2510 Atherosclerotic heart disease of native coronary artery without angina pectoris: Secondary | ICD-10-CM | POA: Diagnosis present

## 2017-02-19 DIAGNOSIS — Z66 Do not resuscitate: Secondary | ICD-10-CM | POA: Diagnosis present

## 2017-02-19 DIAGNOSIS — N2889 Other specified disorders of kidney and ureter: Secondary | ICD-10-CM | POA: Diagnosis present

## 2017-02-19 DIAGNOSIS — I272 Pulmonary hypertension, unspecified: Secondary | ICD-10-CM | POA: Diagnosis present

## 2017-02-19 DIAGNOSIS — K633 Ulcer of intestine: Secondary | ICD-10-CM | POA: Diagnosis present

## 2017-02-19 DIAGNOSIS — D500 Iron deficiency anemia secondary to blood loss (chronic): Secondary | ICD-10-CM | POA: Diagnosis present

## 2017-02-19 LAB — GLUCOSE, CAPILLARY
GLUCOSE-CAPILLARY: 81 mg/dL (ref 65–99)
Glucose-Capillary: 57 mg/dL — ABNORMAL LOW (ref 65–99)

## 2017-02-19 LAB — CBC
HEMATOCRIT: 24.8 % — AB (ref 36.0–46.0)
HEMOGLOBIN: 7.9 g/dL — AB (ref 12.0–15.0)
MCH: 29.3 pg (ref 26.0–34.0)
MCHC: 31.9 g/dL (ref 30.0–36.0)
MCV: 91.9 fL (ref 78.0–100.0)
PLATELETS: 239 10*3/uL (ref 150–400)
RBC: 2.7 MIL/uL — AB (ref 3.87–5.11)
RDW: 16.2 % — ABNORMAL HIGH (ref 11.5–15.5)
WBC: 8.1 10*3/uL (ref 4.0–10.5)

## 2017-02-19 LAB — BASIC METABOLIC PANEL
ANION GAP: 12 (ref 5–15)
BUN: 63 mg/dL — AB (ref 6–20)
CHLORIDE: 114 mmol/L — AB (ref 101–111)
CO2: 16 mmol/L — AB (ref 22–32)
Calcium: 8.5 mg/dL — ABNORMAL LOW (ref 8.9–10.3)
Creatinine, Ser: 3.78 mg/dL — ABNORMAL HIGH (ref 0.44–1.00)
GFR calc Af Amer: 13 mL/min — ABNORMAL LOW (ref >60)
GFR calc non Af Amer: 11 mL/min — ABNORMAL LOW (ref >60)
GLUCOSE: 73 mg/dL (ref 65–99)
POTASSIUM: 4.1 mmol/L (ref 3.5–5.1)
Sodium: 142 mmol/L (ref 135–145)

## 2017-02-19 LAB — ERYTHROPOIETIN: Erythropoietin: 32.1 m[IU]/mL — ABNORMAL HIGH (ref 2.6–18.5)

## 2017-02-19 MED ORDER — GLUCERNA SHAKE PO LIQD
237.0000 mL | Freq: Three times a day (TID) | ORAL | Status: DC
  Filled 2017-02-19 (×2): qty 237

## 2017-02-19 MED ORDER — FUROSEMIDE 20 MG PO TABS: 20.0000 mg | ORAL_TABLET | Freq: Two times a day (BID) | ORAL | 0 refills | Status: DC

## 2017-02-19 MED ORDER — SODIUM BICARBONATE 650 MG PO TABS: 650.0000 mg | ORAL_TABLET | Freq: Two times a day (BID) | ORAL | Status: AC

## 2017-02-19 MED ORDER — TRAMADOL HCL 50 MG PO TABS: 50.0000 mg | ORAL_TABLET | Freq: Two times a day (BID) | ORAL | 0 refills | Status: AC | PRN

## 2017-02-19 NOTE — Discharge Summary (Addendum)
Physician Discharge Summary  [name] ATF:[id number] DOB: 21-Oct-1950 DOA: 02/17/2017  PCP: Arnoldo Morale, MD  Admit date: 02/17/2017 Discharge date: 02/19/2017  Time spent: 35* minutes  Recommendations for Outpatient Follow-up:  1. Check CBC, BMP in one week 2. Lasix dose changed to 20 mg po bid   Discharge Diagnoses:  Principal Problem:   Symptomatic anemia Active Problems:   Diet-controlled diabetes mellitus (HCC)   Iron deficiency anemia   Stage 4 chronic kidney disease (HCC)   Chronic diastolic CHF (congestive heart failure) (HCC)   Essential hypertension   Dementia   Right renal mass   Metabolic acidosis, NAG, failure of bicarbonate regeneration   Discharge Condition: Stable  Diet recommendation: Low salt diet  Filed Weights   02/17/17 1850 02/18/17 0142  Weight: 56.7 kg (125 lb) 56 kg (123 lb 7.3 oz)    History of present illness:  66 y.o.femalewith medical history significant ofCHF with EF 40-45%, CKD stage 4, HTN, CAD, dementia, COPD. Patient sent in to ED with worsening symptomatic anemia. Patient was recently admitted last month to hospital for symptomatic anemia due to iron def probably from chronic blood loss / chronic GIB due to small and large bowel erosions seen on colonoscopy / capsule study, felt possibly to be due to ASA/NSAIDs, also has h/o AVMs    Hospital Course:   Symptomatic anemia- likely from chronic ongoing blood loss and iron deficiency. Patient had a capsule endoscopy in November, revealed multiple small bowel erosions, aphthous ulcers and 1-2 ulcers in visualized portion of right colon. Colonoscopy - terminal ileum appeared normal but biopsies were taken due to the abnormal capsule findings. A single localized nonbleeding erosion with mild surrounding erythema was found at the ileocecal valve. A 6 mm sessile polyp was removed from the transverse colon. Patient is status post 1 unit PRBC. Hemoglobin is 7.9 today. W. We have stopped  aspirin. Patient was advised not to take aspirin or NSAIDs during previous hospitalization.  CKD stage IV- patient's baseline creatinine is around 3.Had worsening a couple of days ago with creat up to 4.2 .Dr. Linna Darner reduced Lasix to 20mg  BID (from 60mg  BID) for 1 week. Creat down to 3.54 today. Continue with Lasix 20 mg by mouth twice a day. Renal ultrasound shows medical renal disease.  NAG metabolic acidosis- started on sodium bicarbonate tablets 650 mg bid  Hypertension-blood pressure stable, continue Coreg, Lasix.  Diabetes mellitus- start sliding scale insulin with NovoLog.  Dementia-chronic, no behavior disturbance. Stable. At baseline     Procedures:  None   Consultations:  None   Discharge Exam: Vitals:   02/18/17 2309 02/19/17 0500  BP: (!) 165/44 (!) 144/45  Pulse: (!) 53 (!) 49  Resp:  16  Temp:  99.1 F (37.3 C)  SpO2:  96%    General: Appear in no acute distress Cardiovascular: S1S2 RRR Respiratory: Clear bilaterally  Discharge Instructions   Discharge Instructions    Diet - low sodium heart healthy   Complete by:  As directed    Increase activity slowly   Complete by:  As directed      Allergies as of 02/19/2017      Reactions   Feraheme [ferumoxytol] Anaphylaxis   hives      Medication List    TAKE these medications   acetaminophen 325 MG tablet Commonly known as:  TYLENOL Take 325 mg by mouth every 6 (six) hours as needed (for pain.).   atorvastatin 80 MG tablet Commonly known as:  LIPITOR  TAKE 1 TABLET BY MOUTH  DAILY AT 6 PM.   bisacodyl 10 MG suppository Commonly known as:  DULCOLAX Place 10 mg rectally daily as needed for moderate constipation (for constipation.).   carvedilol 6.25 MG tablet Commonly known as:  COREG TAKE 1 TABLET BY MOUTH TWO  TIMES DAILY WITH A MEAL What changed:    how much to take  how to take this  when to take this  additional instructions   donepezil 10 MG tablet Commonly known as:   ARICEPT Take 1 tablet (10 mg total) by mouth at bedtime. What changed:  additional instructions   ferrous sulfate 325 (65 FE) MG tablet Take 1 tablet (325 mg total) by mouth 2 (two) times daily with a meal. What changed:  additional instructions   fish oil-omega-3 fatty acids 1000 MG capsule Take 1 g by mouth daily. (7824)   folic acid 1 MG tablet Commonly known as:  FOLVITE Take 1 tablet (1 mg total) by mouth daily. What changed:  additional instructions   furosemide 20 MG tablet Commonly known as:  LASIX Take 1 tablet (20 mg total) by mouth 2 (two) times daily. What changed:    additional instructions  Another medication with the same name was removed. Continue taking this medication, and follow the directions you see here.   hydrALAZINE 50 MG tablet Commonly known as:  APRESOLINE TAKE 1 TABLET BY MOUTH 3  TIMES DAILY What changed:    how much to take  how to take this  when to take this   magnesium hydroxide 400 MG/5ML suspension Commonly known as:  MILK OF MAGNESIA Take 30 mLs by mouth daily as needed (for constipation.).   nitroGLYCERIN 0.4 MG SL tablet Commonly known as:  NITROSTAT Place 0.4 mg under the tongue every 5 (five) minutes as needed for chest pain. X 3 doses   NUTRITIONAL SUPPLEMENT Liqd Take 120 mLs by mouth 2 (two) times daily. MedPass (0900 & 1700)   pantoprazole 40 MG tablet Commonly known as:  PROTONIX TAKE 1 TABLET BY MOUTH  DAILY What changed:    how much to take  how to take this  when to take this   promethazine 25 MG tablet Commonly known as:  PHENERGAN Take 25 mg by mouth every 6 (six) hours as needed for nausea or vomiting. x3 doses   sertraline 50 MG tablet Commonly known as:  ZOLOFT Take 1 tablet (50 mg total) by mouth daily. What changed:  additional instructions   sodium bicarbonate 650 MG tablet Take 1 tablet (650 mg total) by mouth 2 (two) times daily.   traMADol 50 MG tablet Commonly known as:  ULTRAM Take 1  tablet (50 mg total) by mouth every 12 (twelve) hours as needed for moderate pain.      Allergies  Allergen Reactions  . Feraheme [Ferumoxytol] Anaphylaxis    hives      The results of significant diagnostics from this hospitalization (including imaging, microbiology, ancillary and laboratory) are listed below for reference.    Significant Diagnostic Studies: US Renal  Result Date: 02/18/2017 CLINICAL DATA:  Acute onset of renal insufficiency. EXAM: RENAL / URINARY TRACT ULTRASOUND COMPLETE COMPARISON:  CT of the abdomen and pelvis from 01/10/2017, and renal ultrasound performed 03/07/2015 FINDINGS: Right Kidney: Length: 10.1 cm. Diffusely increased parenchymal echogenicity is noted. A small 0.8 cm cyst is noted at the upper pole of the right kidney. Scattered vascular calcifications are noted at the right hilum, on comparison with prior CT.  No mass or hydronephrosis visualized. Left Kidney: Length: 0.8 cm. Very poorly characterized due to overlying bowel gas and limitations in positioning. Bladder: Appears normal for degree of bladder distention. IMPRESSION: 1. No evidence of hydronephrosis. Left kidney is very poorly characterized due to overlying bowel gas and limitations in positioning. 2. Diffusely increased renal parenchymal echogenicity on the right raises concern for medical renal disease. 3. Small right renal cyst noted. Electronically Signed   By: Garald Balding M.D.   On: 02/18/2017 00:25    Microbiology: Recent Results (from the past 240 hour(s))  MRSA PCR Screening     Status: None   Collection Time: 02/18/17  1:55 AM  Result Value Ref Range Status   MRSA by PCR NEGATIVE NEGATIVE Final    Comment:        The GeneXpert MRSA Assay (FDA approved for NASAL specimens only), is one component of a comprehensive MRSA colonization surveillance program. It is not intended to diagnose MRSA infection nor to guide or monitor treatment for MRSA infections.      Labs: Basic  Metabolic Panel: Recent Labs  Lab 02/15/17 02/17/17 1835 02/18/17 0536 02/19/17 0505  NA 144 141 141 142  K 4.1 3.7 3.7 4.1  CL  --  114* 113* 114*  CO2  --  16* 17* 16*  GLUCOSE  --  148* 79 73  BUN 67* 65* 65* 63*  CREATININE 4.2* 3.78* 3.54* 3.78*  CALCIUM  --  8.4* 8.3* 8.5*   Liver Function Tests: Recent Labs  Lab 02/15/17 02/17/17 1835  AST 18 24  ALT 9 13*  ALKPHOS 83 65  BILITOT  --  0.6  PROT  --  5.6*  ALBUMIN  --  2.0*   CBC: Recent Labs  Lab 02/15/17 02/17/17 1835 02/18/17 0536 02/19/17 0505  WBC 12.7 11.3* 10.2 8.1  NEUTROABS 11 9.7*  --   --   HGB 7.7* 7.2* 8.2* 7.9*  HCT 24* 22.7* 24.6* 24.8*  MCV  --  94.6 91.8 91.9  PLT 262 272 247 239   Cardiac Enzymes: No results for input(s): CKTOTAL, CKMB, CKMBINDEX, TROPONINI in the last 168 hours. BNP: BNP (last 3 results) Recent Labs    06/21/16 1624  BNP >4,500.0*    ProBNP (last 3 results) No results for input(s): PROBNP in the last 8760 hours.  CBG: Recent Labs  Lab 02/18/17 0150 02/18/17 1222 02/18/17 1717 02/19/17 0752 02/19/17 0813  GLUCAP 100* 94 99 57* 81      Signed:  Oswald Hillock MD.  [facility] 02/19/2017, 12:39 PM

## 2017-02-19 NOTE — Progress Notes (Addendum)
LCSW received call from MD that patient is from SNF and would like for patient to return.  Chart reviewed and patient is from [facility]. Call placed to [facility] to see if patient can return today and message left. Awaiting call back for possible discharge.  FL2 updated. Will notify RN and family once call returned from facility.  12:42 PM [facility] has returned call back and agreeable for patient return this afternoon. MD has been paged. Awaiting DC orders and paperwork. Will send via [place]. Will alert family once completed and RN to call report.  Lane Hacker, MSW Clinical Social Work: [employer]

## 2017-02-19 NOTE — NC FL2 (Signed)
[place] LEVEL OF CARE SCREENING TOOL     IDENTIFICATION  Patient Name: [name] Birthdate: 11-24-1950 Sex: female Admission Date (Current Location): 02/17/2017  [facility] and [place] Number:  Herbalist and Address:  [facility],  [place] [address] [place]      Provider Number: [id number]  Attending Physician Name and Address:  Oswald Hillock, MD  Relative Name and Phone Number:       Current Level of Care: Hospital Recommended Level of Care: [place] Prior Approval Number:    Date Approved/Denied:   PASRR Number:    Discharge Plan: SNF    Current Diagnoses: Patient Active Problem List   Diagnosis Date Noted  . Metabolic acidosis, NAG, failure of bicarbonate regeneration 02/17/2017  . Benign neoplasm of transverse colon   . Anaphylaxis 01/12/2017  . Acute-on-chronic kidney injury ([place]) 01/10/2017  . Right renal mass 12/30/2016  . Caliectasis 12/27/2016  . Anemia 12/27/2016  . Skin breakdown 12/27/2016  . Boil 12/27/2016  . Hematuria 12/27/2016  . Abscess of groin   . Bilateral carotid artery stenosis 08/22/2016  . Pulmonary nodule 06/29/2016  . Acute kidney injury superimposed on chronic kidney disease ([place]) 06/23/2016  . Acute on chronic combined systolic and diastolic CHF (congestive heart failure) ([place]) 06/23/2016  . Hypokalemia 06/23/2016  . Acute esophagitis   . Angiodysplasia of duodenum   . Symptomatic anemia 06/21/2016  . Cardiac enzymes elevated   . Elevated liver enzymes   . Transaminitis   . Demand ischemia ([place])   . Coffee ground emesis   . Occult blood in stools   . Dementia 03/04/2016  . CAP (community acquired pneumonia) 05/13/2015  . Indwelling catheter present on admission 04/21/2015  . Difficulty breathing   . Pulmonary infiltrates   . Staphylococcus aureus bacteremia   . Palliative care encounter   . Malnutrition of moderate degree 03/30/2015  . HCAP  (healthcare-associated pneumonia) 03/29/2015  . Acute respiratory failure ([place]) 03/29/2015  . Hypomagnesemia 03/29/2015  . Low serum vitamin D 03/29/2015  . Coronary artery disease involving native coronary artery of native heart with angina pectoris ([place])   . Sacral ulcer ([place])   . Urinary retention   . Hypocalcemia 03/20/2015  . Lewy body dementia without behavioral disturbance   . AKI (acute kidney injury) ([place])   . Protein-calorie malnutrition ([place]) 01/24/2015  . Abnormal nuclear stress test   . Stage 4 chronic kidney disease ([place])   . Carotid artery disease ([place])   . CAD (coronary artery disease)   . Chronic diastolic CHF (congestive heart failure) ([place])   . Essential hypertension   . Former tobacco use   . Bradycardia   . UTI (urinary tract infection) 01/27/2012  . Nausea & vomiting 01/26/2012  . 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 Diet-controlled diabetes mellitus ([place]) 01/25/2012  . Iron deficiency anemia 01/25/2012  . Renal artery stenosis ([place]) 11/19/2010  . Peripheral vascular disease ([place]) 11/19/2010  . Diabetes mellitus with complication ([place]) 67/01/4579  . Hyperlipidemia 05/16/2008  . OBESITY 05/16/2008  . Anemia, chronic disease 05/16/2008  . ANXIETY DEPRESSION 05/16/2008  . History of CVA (cerebrovascular accident) 05/16/2008  . Chronic obstructive pulmonary disease ([place]) 05/16/2008  . DIVERTICULITIS, HX OF 05/16/2008    Orientation RESPIRATION BLADDER Height & Weight     Self, Situation, Place, Time  Normal Incontinent Weight: 123 lb 7.3 oz (56 kg) Height:  4\' 11"  (149.9 cm)  BEHAVIORAL SYMPTOMS/MOOD NEUROLOGICAL BOWEL NUTRITION STATUS      Continent Diet  AMBULATORY STATUS COMMUNICATION OF NEEDS Skin   Extensive Assist Verbally Normal                       Personal Care Assistance Level of Assistance  Bathing, Feeding, Dressing Bathing Assistance: Limited assistance Feeding assistance: Limited  assistance Dressing Assistance: Limited assistance     Functional Limitations Info  Sight, Hearing, Speech Sight Info: Adequate Hearing Info: Adequate Speech Info: Adequate    SPECIAL CARE FACTORS FREQUENCY  PT (By licensed PT), OT (By licensed OT)     PT Frequency: 5x OT Frequency: 5x            Contractures Contractures Info: Not present    Additional Factors Info  Code Status, Allergies Code Status Info: DNR Allergies Info: Feraheme Ferumoxytol           Current Medications (02/19/2017):  This is the current hospital active medication list Current Facility-Administered Medications  Medication Dose Route Frequency Provider Last Rate Last Dose  . acetaminophen (TYLENOL) tablet 650 mg  650 mg Oral Q6H PRN [name], DO       Or  . acetaminophen (TYLENOL) suppository 650 mg  650 mg Rectal Q6H PRN [name], DO      . atorvastatin (LIPITOR) tablet 80 mg  80 mg Oral q1800 [name], DO   80 mg at 02/18/17 1731  . bisacodyl (DULCOLAX) suppository 10 mg  10 mg Rectal Daily PRN [name], DO      . carvedilol (COREG) tablet 6.25 mg  6.25 mg Oral BID Jennette Kettle M, DO   6.25 mg at 02/19/17 0901  . donepezil (ARICEPT) tablet 10 mg  10 mg Oral QHS Jennette Kettle M, DO   10 mg at 02/18/17 2301  . ferrous sulfate tablet 325 mg  325 mg Oral BID WC Jennette Kettle M, DO   325 mg at 02/19/17 0900  . folic acid (FOLVITE) tablet 1 mg  1 mg Oral Daily Jennette Kettle M, DO   1 mg at 02/19/17 0901  . furosemide (LASIX) tablet 20 mg  20 mg Oral BID [name], DO   20 mg at 02/18/17 1731  . hydrALAZINE (APRESOLINE) tablet 50 mg  50 mg Oral TID [name], DO   50 mg at 02/19/17 0900  . MEDLINE mouth rinse  15 mL Mouth Rinse BID [name], DO   15 mL at 02/18/17 2308  . ondansetron (ZOFRAN) tablet 4 mg  4 mg Oral Q6H PRN [name], DO       Or  . ondansetron [facility]) injection 4 mg  4 mg Intravenous Q6H PRN [name], DO       . pantoprazole (PROTONIX) EC tablet 40 mg  40 mg Oral Daily Jennette Kettle M, DO   40 mg at 02/19/17 0900  . sertraline (ZOLOFT) tablet 50 mg  50 mg Oral Daily Jennette Kettle M, DO   50 mg at 02/19/17 0901  . sodium bicarbonate tablet 650 mg  650 mg Oral BID Jennette Kettle M, DO   650 mg at 02/19/17 0900  . traMADol (ULTRAM) tablet 50 mg  50 mg Oral Q12H PRN [name], DO         Discharge Medications: Please see discharge summary for a list of discharge medications.  Relevant Imaging Results:  Relevant Lab Results:  Additional Information [id number]  [place], [place]

## 2017-02-19 NOTE — Progress Notes (Signed)
Initial Nutrition Assessment  DOCUMENTATION CODES:   Non-severe (moderate) malnutrition in context of chronic illness  INTERVENTION:   Glucerna Shake po TID, each supplement provides 220 kcal and 10 grams of protein  NUTRITION DIAGNOSIS:   Moderate Malnutrition related to chronic illness(dementia/CHF/CKD) as evidenced by moderate fat depletion, moderate muscle depletion  GOAL:   Patient will meet greater than or equal to 90% of their needs  MONITOR:   PO intake, Supplement acceptance, Weight trends, I & O's  REASON FOR ASSESSMENT:   Malnutrition Screening Tool    ASSESSMENT:   Pt from [facility] with PMH of dementia, COPD, CAD, CHF, CKD stage IV, HLD  presents with symptomatic anemia.    No historians at bedside. Pt unable to elaborate on RD questions at visit. Pt reports a good appetite and no known changes in weight.  Per chart, pt consuming between 25-75% of meals at this time. Per chart, pt's weight seems fluctuated but trended down in the past couple months. Pt with CHF/CKD, weight changes may be r/t fluid status.  Pt with hypoglycemic episode this morning. SSI  discontinued  Labs reviewed; CBG 57 (low) - 100, BUN 63, Creatinine 3.78, Albumin 2.0, Hemoglobin 7.9, Hemoglobin A1C 5.2 (WNL) Medications reviewed; ferrous sulfate, folic acid, Protonix, sodium bicarbonate, Lasix   NUTRITION - FOCUSED PHYSICAL EXAM:    Most Recent Value  Orbital Region  Mild depletion  Upper Arm Region  Moderate depletion  Thoracic and Lumbar Region  Mild depletion  Temple Region  Mild depletion  Clavicle Bone Region  Mild depletion  Clavicle and Acromion Bone Region  Moderate depletion  Scapular Bone Region  Unable to assess  Dorsal Hand  Mild depletion  Patellar Region  Moderate depletion  Anterior Thigh Region  Moderate depletion  Posterior Calf Region  Severe depletion  Edema (RD Assessment)  Mild      Diet Order:  Diet heart healthy/carb modified Room service  appropriate? Yes; Fluid consistency: Thin  EDUCATION NEEDS:   Not appropriate for education at this time  Skin:  Skin Assessment: Skin Integrity Issues: Skin Integrity Issues:: Other (Comment) Other: MASD to buttocks  Last BM:  02/18/17  Height:   Ht Readings from Last 1 Encounters:  02/18/17 4\' 11"  (1.499 m)    Weight:   Wt Readings from Last 1 Encounters:  02/18/17 123 lb 7.3 oz (56 kg)    Ideal Body Weight:  44.7 kg  BMI:  Body mass index is 24.94 kg/m.  Estimated Nutritional Needs:   Kcal:  1500-1700  Protein:  70-80 grams  Fluid:  >/= 1.5 L/d  Parks Ranger, MS, RDN, LDN 02/19/2017 12:28 PM

## 2017-02-19 NOTE — Progress Notes (Signed)
Attempted to call report to [facility] at [phone], approximately 15 minutes apart.  No answer.  CSW aware.  PTAR here to pick up patient.

## 2017-02-19 NOTE — Progress Notes (Signed)
Hypoglycemic Event  CBG: 57  Treatment: 15 GM carbohydrate snack  Symptoms: None  Follow-up CBG: Time:813 CBG Result:81  Possible Reasons for Event: Unknown  Comments/MD notified:Dr. Lama rounding on floor to see patient.  SSI d/c    O'Berry, Pavel Gadd D

## 2017-02-20 DIAGNOSIS — E785 Hyperlipidemia, unspecified: Secondary | ICD-10-CM | POA: Diagnosis not present

## 2017-02-20 DIAGNOSIS — E8770 Fluid overload, unspecified: Secondary | ICD-10-CM | POA: Diagnosis not present

## 2017-02-20 DIAGNOSIS — I2510 Atherosclerotic heart disease of native coronary artery without angina pectoris: Secondary | ICD-10-CM | POA: Diagnosis not present

## 2017-02-20 DIAGNOSIS — I130 Hypertensive heart and chronic kidney disease with heart failure and stage 1 through stage 4 chronic kidney disease, or unspecified chronic kidney disease: Secondary | ICD-10-CM | POA: Diagnosis not present

## 2017-02-20 DIAGNOSIS — M6281 Muscle weakness (generalized): Secondary | ICD-10-CM | POA: Diagnosis not present

## 2017-02-20 DIAGNOSIS — K5521 Angiodysplasia of colon with hemorrhage: Secondary | ICD-10-CM | POA: Diagnosis not present

## 2017-02-20 DIAGNOSIS — L02214 Cutaneous abscess of groin: Secondary | ICD-10-CM | POA: Diagnosis not present

## 2017-02-20 DIAGNOSIS — D631 Anemia in chronic kidney disease: Secondary | ICD-10-CM | POA: Diagnosis not present

## 2017-02-20 DIAGNOSIS — F0390 Unspecified dementia without behavioral disturbance: Secondary | ICD-10-CM | POA: Diagnosis not present

## 2017-02-20 DIAGNOSIS — E11319 Type 2 diabetes mellitus with unspecified diabetic retinopathy without macular edema: Secondary | ICD-10-CM | POA: Diagnosis not present

## 2017-02-20 DIAGNOSIS — R262 Difficulty in walking, not elsewhere classified: Secondary | ICD-10-CM | POA: Diagnosis not present

## 2017-02-20 DIAGNOSIS — Z48817 Encounter for surgical aftercare following surgery on the skin and subcutaneous tissue: Secondary | ICD-10-CM | POA: Diagnosis not present

## 2017-02-20 DIAGNOSIS — R488 Other symbolic dysfunctions: Secondary | ICD-10-CM | POA: Diagnosis not present

## 2017-02-20 DIAGNOSIS — Z79899 Other long term (current) drug therapy: Secondary | ICD-10-CM | POA: Diagnosis not present

## 2017-02-20 DIAGNOSIS — J189 Pneumonia, unspecified organism: Secondary | ICD-10-CM | POA: Diagnosis not present

## 2017-02-20 DIAGNOSIS — I5043 Acute on chronic combined systolic (congestive) and diastolic (congestive) heart failure: Secondary | ICD-10-CM | POA: Diagnosis not present

## 2017-02-20 DIAGNOSIS — N179 Acute kidney failure, unspecified: Secondary | ICD-10-CM | POA: Diagnosis not present

## 2017-02-20 DIAGNOSIS — J449 Chronic obstructive pulmonary disease, unspecified: Secondary | ICD-10-CM | POA: Diagnosis not present

## 2017-02-20 DIAGNOSIS — F329 Major depressive disorder, single episode, unspecified: Secondary | ICD-10-CM | POA: Diagnosis not present

## 2017-02-20 DIAGNOSIS — N189 Chronic kidney disease, unspecified: Secondary | ICD-10-CM | POA: Diagnosis not present

## 2017-02-20 DIAGNOSIS — E872 Acidosis: Secondary | ICD-10-CM | POA: Diagnosis not present

## 2017-02-20 DIAGNOSIS — R310 Gross hematuria: Secondary | ICD-10-CM | POA: Diagnosis not present

## 2017-02-20 DIAGNOSIS — I5032 Chronic diastolic (congestive) heart failure: Secondary | ICD-10-CM | POA: Diagnosis not present

## 2017-02-20 DIAGNOSIS — I701 Atherosclerosis of renal artery: Secondary | ICD-10-CM | POA: Diagnosis not present

## 2017-02-20 DIAGNOSIS — D508 Other iron deficiency anemias: Secondary | ICD-10-CM | POA: Diagnosis not present

## 2017-02-20 DIAGNOSIS — M25551 Pain in right hip: Secondary | ICD-10-CM | POA: Diagnosis not present

## 2017-02-20 DIAGNOSIS — K633 Ulcer of intestine: Secondary | ICD-10-CM | POA: Diagnosis not present

## 2017-02-20 DIAGNOSIS — N2889 Other specified disorders of kidney and ureter: Secondary | ICD-10-CM | POA: Diagnosis not present

## 2017-02-20 DIAGNOSIS — Z95828 Presence of other vascular implants and grafts: Secondary | ICD-10-CM | POA: Diagnosis not present

## 2017-02-20 DIAGNOSIS — I129 Hypertensive chronic kidney disease with stage 1 through stage 4 chronic kidney disease, or unspecified chronic kidney disease: Secondary | ICD-10-CM | POA: Diagnosis not present

## 2017-02-20 DIAGNOSIS — Z794 Long term (current) use of insulin: Secondary | ICD-10-CM | POA: Diagnosis not present

## 2017-02-20 DIAGNOSIS — Z7982 Long term (current) use of aspirin: Secondary | ICD-10-CM | POA: Diagnosis not present

## 2017-02-20 DIAGNOSIS — N184 Chronic kidney disease, stage 4 (severe): Secondary | ICD-10-CM | POA: Diagnosis not present

## 2017-02-20 DIAGNOSIS — R319 Hematuria, unspecified: Secondary | ICD-10-CM | POA: Diagnosis not present

## 2017-02-20 DIAGNOSIS — E1151 Type 2 diabetes mellitus with diabetic peripheral angiopathy without gangrene: Secondary | ICD-10-CM | POA: Diagnosis not present

## 2017-02-20 DIAGNOSIS — D500 Iron deficiency anemia secondary to blood loss (chronic): Secondary | ICD-10-CM | POA: Diagnosis not present

## 2017-02-20 LAB — OCCULT BLOOD, POC DEVICE: FECAL OCCULT BLD: NEGATIVE

## 2017-02-21 LAB — TYPE AND SCREEN
ABO/RH(D): O POS
Antibody Screen: NEGATIVE
Unit division: 0
Unit division: 0

## 2017-02-21 LAB — BPAM RBC
Blood Product Expiration Date: 201901222359
Blood Product Expiration Date: 201901262359
ISSUE DATE / TIME: 201812282238
Unit Type and Rh: 5100
Unit Type and Rh: 5100

## 2017-02-22 LAB — GLUCOSE, CAPILLARY: GLUCOSE-CAPILLARY: 91 mg/dL (ref 65–99)

## 2017-02-23 LAB — BASIC METABOLIC PANEL
BUN: 59 — AB (ref 4–21)
CREATININE: 3.8 — AB (ref 0.5–1.1)
Glucose: 69
POTASSIUM: 4.5 (ref 3.4–5.3)
Sodium: 145 (ref 137–147)

## 2017-02-26 NOTE — Anesthesia Preprocedure Evaluation (Addendum)
Anesthesia Evaluation  Patient identified by MRN, date of birth, ID band Patient awake    Airway Mallampati: II  TM Distance: >3 FB Neck ROM: Full    Dental  (+) Edentulous Upper,    Pulmonary COPD,  oxygen dependent, Current Smoker,    breath sounds clear to auscultation       Cardiovascular hypertension, + angina + CAD, + Peripheral Vascular Disease and +CHF   Rhythm:Regular Rate:Normal  Left ventricle: Distal septal / apical hypokinesis The cavity   size was mildly dilated. Wall thickness was normal. Systolic   function was mildly to moderately reduced. The estimated ejection   fraction was in the range of 40% to 45%. Doppler parameters are   consistent with both elevated ventricular end-diastolic filling   pressure and elevated left atrial filling pressure.   Neuro/Psych Dementia   GI/Hepatic   Endo/Other  diabetes  Renal/GU Renal disease     Musculoskeletal   Abdominal   Peds  Hematology  (+) anemia , Received a transfusion 12/31 for Hgb 7.2   Anesthesia Other Findings WBC 4.0 - 10.5 K/uL 9.5  8.1  10.2  11.3 Abnormally high   RBC 3.87 - 5.11 MIL/uL 2.85 Abnormally low   2.70 Abnormally low   2.68 Abnormally low   2.40 Abnormally low   Hemoglobin 12.0 - 15.0 g/dL 8.5 Abnormally low   7.9 Abnormally low   8.2 Abnormally low   7.2 Abnormally low   HCT 36.0 - 46.0 % 26.7 Abnormally low   24.8 Abnormally low   24.6 Abnormally low   22.7 Abnormally low   MCV 78.0 - 100.0 fL 93.7  91.9  91.8  94.6  MCH 26.0 - 34.0 pg 29.8  29.3  30.6  30.0  MCHC 30.0 - 36.0 g/dL 31.8  31.9  33.3  31.7  RDW 11.5 - 15.5 % 15.4  16.2 Abnormally high   15.9 Abnormally high   15.1  Platelets 150 - 400 K/uL 269  239  247  272     Reproductive/Obstetrics                            Anesthesia Physical Anesthesia Plan  ASA: IV  Anesthesia Plan: General   Post-op Pain Management:    Induction:  Intravenous  PONV Risk Score and Plan: 1 and Treatment may vary due to age or medical condition  Airway Management Planned: LMA  Additional Equipment:   Intra-op Plan:   Post-operative Plan: Extubation in OR  Informed Consent: I have reviewed the patients History and Physical, chart, labs and discussed the procedure including the risks, benefits and alternatives for the proposed anesthesia with the patient or authorized representative who has indicated his/her understanding and acceptance.     Plan Discussed with: CRNA and Surgeon  Anesthesia Plan Comments:        Anesthesia Quick Evaluation

## 2017-02-27 ENCOUNTER — Ambulatory Visit (HOSPITAL_COMMUNITY): Payer: Medicare Other

## 2017-02-27 ENCOUNTER — Ambulatory Visit (HOSPITAL_COMMUNITY): Payer: Medicare Other | Admitting: Anesthesiology

## 2017-02-27 ENCOUNTER — Inpatient Hospital Stay: Payer: Medicare Other | Admitting: Family Medicine

## 2017-02-27 ENCOUNTER — Encounter (HOSPITAL_COMMUNITY): Payer: Self-pay | Admitting: Anesthesiology

## 2017-02-27 ENCOUNTER — Encounter (HOSPITAL_COMMUNITY)
Admission: RE | Disposition: A | Discharge status: Home / Self Care | Payer: Self-pay | Source: Ambulatory Visit | Attending: Urology

## 2017-02-27 ENCOUNTER — Ambulatory Visit (HOSPITAL_COMMUNITY)
Admission: RE | Admit: 2017-02-27 | Discharge: 2017-02-27 | Disposition: A | Discharge status: Home / Self Care | Payer: Medicare Other | Source: Ambulatory Visit | Attending: Urology | Admitting: Urology

## 2017-02-27 ENCOUNTER — Other Ambulatory Visit: Payer: Self-pay

## 2017-02-27 DIAGNOSIS — F329 Major depressive disorder, single episode, unspecified: Secondary | ICD-10-CM | POA: Insufficient documentation

## 2017-02-27 DIAGNOSIS — Z7982 Long term (current) use of aspirin: Secondary | ICD-10-CM | POA: Insufficient documentation

## 2017-02-27 DIAGNOSIS — I129 Hypertensive chronic kidney disease with stage 1 through stage 4 chronic kidney disease, or unspecified chronic kidney disease: Secondary | ICD-10-CM | POA: Diagnosis not present

## 2017-02-27 DIAGNOSIS — N189 Chronic kidney disease, unspecified: Secondary | ICD-10-CM | POA: Diagnosis not present

## 2017-02-27 DIAGNOSIS — F0390 Unspecified dementia without behavioral disturbance: Secondary | ICD-10-CM | POA: Insufficient documentation

## 2017-02-27 DIAGNOSIS — Z79899 Other long term (current) drug therapy: Secondary | ICD-10-CM | POA: Insufficient documentation

## 2017-02-27 DIAGNOSIS — N179 Acute kidney failure, unspecified: Secondary | ICD-10-CM | POA: Diagnosis not present

## 2017-02-27 DIAGNOSIS — I5043 Acute on chronic combined systolic (congestive) and diastolic (congestive) heart failure: Secondary | ICD-10-CM | POA: Diagnosis not present

## 2017-02-27 DIAGNOSIS — D631 Anemia in chronic kidney disease: Secondary | ICD-10-CM | POA: Diagnosis not present

## 2017-02-27 DIAGNOSIS — R310 Gross hematuria: Secondary | ICD-10-CM | POA: Insufficient documentation

## 2017-02-27 DIAGNOSIS — R319 Hematuria, unspecified: Secondary | ICD-10-CM | POA: Diagnosis not present

## 2017-02-27 DIAGNOSIS — J449 Chronic obstructive pulmonary disease, unspecified: Secondary | ICD-10-CM | POA: Insufficient documentation

## 2017-02-27 DIAGNOSIS — I130 Hypertensive heart and chronic kidney disease with heart failure and stage 1 through stage 4 chronic kidney disease, or unspecified chronic kidney disease: Secondary | ICD-10-CM | POA: Diagnosis not present

## 2017-02-27 DIAGNOSIS — N184 Chronic kidney disease, stage 4 (severe): Secondary | ICD-10-CM | POA: Diagnosis not present

## 2017-02-27 HISTORY — DX: Other abnormalities of gait and mobility: R26.89

## 2017-02-27 HISTORY — DX: Atherosclerosis of renal artery: I70.1

## 2017-02-27 HISTORY — DX: Muscle weakness (generalized): M62.81

## 2017-02-27 HISTORY — DX: Anxiety disorder, unspecified: F41.9

## 2017-02-27 HISTORY — DX: Hematuria, unspecified: R31.9

## 2017-02-27 HISTORY — PX: CYSTOSCOPY WITH RETROGRADE PYELOGRAM, URETEROSCOPY AND STENT PLACEMENT: SHX5789

## 2017-02-27 LAB — BASIC METABOLIC PANEL
Anion gap: 11 (ref 5–15)
BUN: 65 mg/dL — AB (ref 6–20)
CALCIUM: 8.6 mg/dL — AB (ref 8.9–10.3)
CO2: 18 mmol/L — ABNORMAL LOW (ref 22–32)
Chloride: 111 mmol/L (ref 101–111)
Creatinine, Ser: 3.74 mg/dL — ABNORMAL HIGH (ref 0.44–1.00)
GFR calc Af Amer: 14 mL/min — ABNORMAL LOW (ref >60)
GFR, EST NON AFRICAN AMERICAN: 12 mL/min — AB (ref >60)
Glucose, Bld: 77 mg/dL (ref 65–99)
POTASSIUM: 4.5 mmol/L (ref 3.5–5.1)
SODIUM: 140 mmol/L (ref 135–145)

## 2017-02-27 LAB — CBC
HEMATOCRIT: 26.7 % — AB (ref 36.0–46.0)
Hemoglobin: 8.5 g/dL — ABNORMAL LOW (ref 12.0–15.0)
MCH: 29.8 pg (ref 26.0–34.0)
MCHC: 31.8 g/dL (ref 30.0–36.0)
MCV: 93.7 fL (ref 78.0–100.0)
Platelets: 269 10*3/uL (ref 150–400)
RBC: 2.85 MIL/uL — ABNORMAL LOW (ref 3.87–5.11)
RDW: 15.4 % (ref 11.5–15.5)
WBC: 9.5 10*3/uL (ref 4.0–10.5)

## 2017-02-27 LAB — GLUCOSE, CAPILLARY: Glucose-Capillary: 72 mg/dL (ref 65–99)

## 2017-02-27 LAB — HEMOGLOBIN A1C
HEMOGLOBIN A1C: 5.3 % (ref 4.8–5.6)
MEAN PLASMA GLUCOSE: 105.41 mg/dL

## 2017-02-27 SURGERY — CYSTOURETEROSCOPY, WITH RETROGRADE PYELOGRAM AND STENT INSERTION
Anesthesia: General | Laterality: Left

## 2017-02-27 MED ORDER — PROMETHAZINE HCL 25 MG/ML IJ SOLN: 6.2500 mg | INTRAMUSCULAR | Status: DC | PRN

## 2017-02-27 MED ORDER — FENTANYL CITRATE (PF) 100 MCG/2ML IJ SOLN
INTRAMUSCULAR | Status: AC
  Filled 2017-02-27: qty 2

## 2017-02-27 MED ORDER — PROPOFOL 10 MG/ML IV BOLUS
INTRAVENOUS | Status: AC
  Filled 2017-02-27: qty 40

## 2017-02-27 MED ORDER — SODIUM CHLORIDE 0.9 % IR SOLN
Status: DC | PRN
  Administered 2017-02-27: 3000 mL via INTRAVESICAL

## 2017-02-27 MED ORDER — TAMSULOSIN HCL 0.4 MG PO CAPS: 0.4000 mg | ORAL_CAPSULE | Freq: Every day | ORAL | 3 refills | Status: AC

## 2017-02-27 MED ORDER — HYDROCODONE-ACETAMINOPHEN 5-325 MG PO TABS: 1.0000 | ORAL_TABLET | ORAL | 0 refills | Status: AC | PRN

## 2017-02-27 MED ORDER — IOHEXOL 300 MG/ML  SOLN
INTRAMUSCULAR | Status: DC | PRN
  Administered 2017-02-27: 20 mL

## 2017-02-27 MED ORDER — PROPOFOL 10 MG/ML IV BOLUS
INTRAVENOUS | Status: DC | PRN
  Administered 2017-02-27: 90 mg via INTRAVENOUS

## 2017-02-27 MED ORDER — EPHEDRINE SULFATE-NACL 50-0.9 MG/10ML-% IV SOSY
PREFILLED_SYRINGE | INTRAVENOUS | Status: DC | PRN
  Administered 2017-02-27: 5 mg via INTRAVENOUS

## 2017-02-27 MED ORDER — GLYCOPYRROLATE 0.2 MG/ML IV SOSY
PREFILLED_SYRINGE | INTRAVENOUS | Status: AC
  Filled 2017-02-27: qty 3

## 2017-02-27 MED ORDER — FENTANYL CITRATE (PF) 100 MCG/2ML IJ SOLN
INTRAMUSCULAR | Status: DC | PRN
  Administered 2017-02-27 (×2): 25 ug via INTRAVENOUS

## 2017-02-27 MED ORDER — MEPERIDINE HCL 50 MG/ML IJ SOLN: 6.2500 mg | INTRAMUSCULAR | Status: DC | PRN

## 2017-02-27 MED ORDER — SODIUM CHLORIDE 0.9 % IR SOLN
Status: DC | PRN
  Administered 2017-02-27: 1000 mL via INTRAVESICAL

## 2017-02-27 MED ORDER — LACTATED RINGERS IV SOLN
INTRAVENOUS | Status: DC
  Administered 2017-02-27 (×2): via INTRAVENOUS

## 2017-02-27 MED ORDER — CEFAZOLIN SODIUM-DEXTROSE 2-4 GM/100ML-% IV SOLN
2.0000 g | INTRAVENOUS | Status: DC
  Filled 2017-02-27: qty 100

## 2017-02-27 MED ORDER — GLYCOPYRROLATE 0.2 MG/ML IV SOSY
PREFILLED_SYRINGE | INTRAVENOUS | Status: DC | PRN
  Administered 2017-02-27 (×2): .2 mg via INTRAVENOUS

## 2017-02-27 MED ORDER — HYDROMORPHONE HCL 1 MG/ML IJ SOLN: 0.2500 mg | INTRAMUSCULAR | Status: DC | PRN

## 2017-02-27 MED ORDER — ACETAMINOPHEN 10 MG/ML IV SOLN: 1000.0000 mg | Freq: Once | INTRAVENOUS | Status: DC | PRN

## 2017-02-27 MED ORDER — TAMSULOSIN HCL 0.4 MG PO CAPS: 0.4000 mg | ORAL_CAPSULE | Freq: Every day | ORAL | Status: DC

## 2017-02-27 MED ORDER — DEXAMETHASONE SODIUM PHOSPHATE 10 MG/ML IJ SOLN
INTRAMUSCULAR | Status: DC | PRN
  Administered 2017-02-27: 8 mg via INTRAVENOUS

## 2017-02-27 MED ORDER — HYDROCODONE-ACETAMINOPHEN 7.5-325 MG PO TABS: 1.0000 | ORAL_TABLET | Freq: Once | ORAL | Status: DC | PRN

## 2017-02-27 MED ORDER — ONDANSETRON HCL 4 MG/2ML IJ SOLN
INTRAMUSCULAR | Status: DC | PRN
  Administered 2017-02-27: 4 mg via INTRAVENOUS

## 2017-02-27 MED ORDER — LIDOCAINE 2% (20 MG/ML) 5 ML SYRINGE
INTRAMUSCULAR | Status: DC | PRN
  Administered 2017-02-27: 50 mg via INTRAVENOUS

## 2017-02-27 SURGICAL SUPPLY — 33 items
BAG URINE DRAINAGE (UROLOGICAL SUPPLIES) IMPLANT
BAG URO CATCHER STRL LF (MISCELLANEOUS) ×4 IMPLANT
BASKET LASER NITINOL 1.9FR (BASKET) ×1 IMPLANT
BASKET ZERO TIP NITINOL 2.4FR (BASKET) IMPLANT
BSKT STON RTRVL 120 1.9FR (BASKET)
BSKT STON RTRVL ZERO TP 2.4FR (BASKET)
CATH INTERMIT  6FR 70CM (CATHETERS) ×3 IMPLANT
CATH URET 5FR 28IN CONE TIP (BALLOONS)
CATH URET 5FR 70CM CONE TIP (BALLOONS) IMPLANT
CLOTH BEACON ORANGE TIMEOUT ST (SAFETY) ×5 IMPLANT
COVER FOOTSWITCH UNIV (MISCELLANEOUS) ×4 IMPLANT
ELECT REM PT RETURN 15FT ADLT (MISCELLANEOUS) ×4 IMPLANT
FIBER LASER FLEXIVA 365 (UROLOGICAL SUPPLIES) IMPLANT
FIBER LASER TRAC TIP (UROLOGICAL SUPPLIES) IMPLANT
GLOVE BIOGEL M STRL SZ7.5 (GLOVE) ×4 IMPLANT
GOWN STRL REUS W/TWL LRG LVL3 (GOWN DISPOSABLE) ×4 IMPLANT
GOWN STRL REUS W/TWL XL LVL3 (GOWN DISPOSABLE) IMPLANT
GUIDEWIRE ANG ZIPWIRE 038X150 (WIRE) IMPLANT
GUIDEWIRE STR DUAL SENSOR (WIRE) ×8 IMPLANT
INFUSOR MANOMETER BAG 3000ML (MISCELLANEOUS) ×1 IMPLANT
LOOP MONOPOLAR YLW (ELECTROSURGICAL) IMPLANT
MANIFOLD NEPTUNE II (INSTRUMENTS) ×4 IMPLANT
PACK CYSTO (CUSTOM PROCEDURE TRAY) ×4 IMPLANT
SET ASPIRATION TUBING (TUBING) IMPLANT
SHEATH ACCESS URETERAL 24CM (SHEATH) IMPLANT
SHEATH ACCESS URETERAL 54CM (SHEATH) IMPLANT
SHEATH URETERAL 12FRX35CM (MISCELLANEOUS) ×3 IMPLANT
STENT URET 6FRX24 CONTOUR (STENTS) ×3 IMPLANT
SYRINGE 10CC LL (SYRINGE) ×4 IMPLANT
SYRINGE IRR TOOMEY STRL 70CC (SYRINGE) IMPLANT
TUBE FEEDING 8FR 16IN STR KANG (MISCELLANEOUS) ×4 IMPLANT
TUBING CONNECTING 10 (TUBING) ×3 IMPLANT
TUBING CONNECTING 10' (TUBING) ×1

## 2017-02-27 NOTE — Discharge Instructions (Addendum)

## 2017-02-27 NOTE — Progress Notes (Signed)
Report called to Ramatou, LPN at [place] at this time. Pt is alert, calm, and pleasant and denies complaints. Husband is at the bedside at this time as well.

## 2017-02-27 NOTE — Interval H&P Note (Signed)
History and Physical Interval Note:  02/27/2017 10:44 AM  [name]  has presented today for surgery, with the diagnosis of hematuria  The various methods of treatment have been discussed with the patient and family. After consideration of risks, benefits and other options for treatment, the patient has consented to  Procedure(s): CYSTOSCOPY WITH BILATERAL RETROGRADE PYELOGRAM, POSSIBLE URETEROSCOPY HOLMIUM LASER LITHO AND STENT PLACEMENT (Bilateral) CYSTOSCOPY WITH POSSIBLE BIOPSY (N/A) POSSIBLE TRANSURETHRAL RESECTION OF BLADDER TUMOR (TURBT) (N/A) as a surgical intervention .  The patient's history has been reviewed, patient examined, no change in status, stable for surgery.  I have reviewed the patient's chart and labs.  Questions were answered to the patient's satisfaction.     Marton Redwood, III

## 2017-02-27 NOTE — Op Note (Signed)
Operative Note  Preoperative diagnosis:  1.  Hematuria  Postoperative diagnosis: 1.  Hematuria  Procedure(s): 1.  Cystoscopy, bilateral retrograde pyelogram, left diagnostic ureteroscopy with left ureteral stent placement, fluoroscopy less than 1 hour  Surgeon: Link Snuffer, MD  Assistants: None  Anesthesia: General  Complications: None immediate  EBL: Minimal  Specimens: 1.  None  Drains/Catheters: 1.  Left 6 x 24 double-J ureteral stent  Intraoperative findings: 1.  Normal urethra and bladder 2.  Left retrograde pyelogram revealed possible filling defects versus air as well as a small amount of extravasation likely secondary to forniceal rupture after the retrograde pyelogram.  Diagnostic ureteroscopy revealed no obvious lesions or stones. 3.  Right retrograde pyelogram revealed no filling defects.  Indication: 67 year old female evaluated for acute on top of chronic renal insufficiency.  The patient had previous CT scan on 12/20/2016 that revealed possible blood product on the right.  Follow-up CT scan on 01/10/2017 was normal.  The patient has had gross hematuria and therefore the decision was made to proceed with the above operation.  Description of procedure:  The patient was identified and consent was obtained.  The patient was taken to the operating room and placed in the supine position.  The patient was placed under general Anesthesia.  Perioperative antibiotics were administered.  The patient was placed in dorsal lithotomy.  Patient was prepped and draped in a standard sterile fashion and a timeout was performed.  A 21 French rigid cystoscope was advanced into the urethra and into the bladder.  A complete cystoscopy was performed first with a 30 degree lens followed by a 70 degree lens.  There were no masses or lesions noted in the bladder.  The left ureteral orifice was cannulated with a open-ended ureteral catheter and a retrograde pyelogram was performed.  This  revealed possible filling defects versus air from the retrograde pyelogram.  There is also some extravasation that occurred as a result of the retrograde pyelogram.  I then cannulated the right ureteral orifice and performed a retrograde pyelogram with no abnormal findings.  There were no obvious filling defects.  She did have a proximal tortuous ureter but no significant filling defect.  I then advanced a wire up the left ureter and into the kidney under fluoroscopic guidance.  A 12 x 14 access sheath was advanced over the wire under continuous fluoroscopic guidance.  A second sensor wire was advanced through the sheath and the sheath was withdrawn.  1 of the wires was used as a safety wire and the other wire was used to readvanced the 12 x 14 access sheath over the wire under continuous fluoroscopic guidance.  Flexible ureteroscopy was performed which revealed no mass lesions in the entire kidney.  There were no stones.  I then withdrew the access sheath along with the scope and there were no lesions or masses noted along the entire course of the ureter.  I then backloaded the wire onto a rigid cystoscope and placed a 6 x 24 double-J ureteral stent over the wire and the wire was withdrawn.  Fluoroscopy confirmed proximal placement and direct visualization confirmed a good coil within the bladder.  The bladder was drained and the scope withdrawn.  The patient tolerated procedure well and was stable postoperatively.  Plan: She will return in about 2 weeks for ureteral stent removal.  No evidence of any malignant cause of her hematuria.  No evidence of any renal calculi or of obstruction to explain her renal insufficiency.

## 2017-02-27 NOTE — H&P (Signed)
CC: I have blood in my urine.  HPI: [name] is a 67 year-old female patient who was referred by Dr. Verneita Griffes, MD who is here for blood in the urine.  She did see the blood in her urine. She has not seen blood clots.   She does not have a burning sensation when she urinates. She is not currently having trouble urinating.   She is not having pain. She has not recently had unwanted weight loss.   The patient has dementia and multiple medical problems. She is on aspirin 81 but no other blood thinners. She has acute renal insufficiency on top of chronic renal insufficiency. This prompted a CT scan without contrast. On 12/20/2016 the CT scan showed mild right caliectasis with possible blood product versus mass lesion. This was not obvious on repeat scan on 01/10/2017. There are no renal or ureteral calculi and no hydronephrosis.     ALLERGIES: None   MEDICATIONS: Aspir 81  Atorvastatin Calcium 80 mg tablet  Carvedilol 6.25 mg tablet  Donepezil Hcl 10 mg tablet  Ferrous Sulfate  Fish Oil  Florastor  Folic Acid  Furosemide 40 mg tablet  Hydralazine Hcl 50 mg tablet  Nitroglycerin  Pantoprazole Sodium 40 mg tablet, delayed release  Promethazine Hcl  Sertraline Hcl 50 mg tablet  Tramadol Hcl 50 mg tablet     GU PSH: None   NON-GU PSH: None   GU PMH: Chronic Kidney Disease    NON-GU PMH: Anemia, unspecified COPD Coronary Artery Disease Depression Hypertension Other specified mental disorders due to known physiological condition    FAMILY HISTORY: None   SOCIAL HISTORY: Marital Status: Unknown    REVIEW OF SYSTEMS:    GU Review Female:   Patient denies frequent urination, hard to postpone urination, burning /pain with urination, get up at night to urinate, leakage of urine, stream starts and stops, trouble starting your stream, have to strain to urinate, and being pregnant.  Gastrointestinal (Upper):   Patient denies nausea, vomiting, and indigestion/ heartburn.   Gastrointestinal (Lower):   Patient denies diarrhea and constipation.  Constitutional:   Patient denies fever, night sweats, weight loss, and fatigue.  Skin:   Patient denies skin rash/ lesion and itching.  Eyes:   Patient denies blurred vision and double vision.  Ears/ Nose/ Throat:   Patient denies sore throat and sinus problems.  Hematologic/Lymphatic:   Patient denies swollen glands and easy bruising.  Cardiovascular:   Patient denies leg swelling and chest pains.  Respiratory:   Patient denies shortness of breath and cough.  Endocrine:   Patient denies excessive thirst.  Musculoskeletal:   Patient denies back pain and joint pain.  Neurological:   Patient denies headaches and dizziness.  Psychologic:   Patient denies depression and anxiety.   VITAL SIGNS:      02/03/2017 02:04 PM  BP 167/58 mmHg  Heart Rate 66 /min  Temperature 97.0 F / 36.1 C   MULTI-SYSTEM PHYSICAL EXAMINATION:    Constitutional: Well-nourished. No physical deformities. Normally developed. Good grooming.  Respiratory: No labored breathing, no use of accessory muscles.   Cardiovascular: Normal temperature, adequate peripheral perfusion  Skin: No paleness, no jaundice  Neurologic / Psychiatric: Oriented to time, oriented to place, oriented to person. No depression, no anxiety, no agitation.  Gastrointestinal: No mass, no tenderness, no rigidity, mildly obese abdomen. No CVA tenderness  Eyes: Normal conjunctivae. Normal eyelids.  Musculoskeletal: Normal gait and station of head and neck.     PAST DATA  REVIEWED:  Source Of History:  Patient  Records Review:   Previous Doctor Records  X-Ray Review: C.T. Abdomen/Pelvis: Reviewed Films. Reviewed Report. Discussed With Patient.     PROCEDURES: None   ASSESSMENT:      ICD-10 Details  1 GU:   Gross hematuria - R31.0    PLAN:           Document Letter(s):  Created for Patient: Clinical Summary         Notes:   Given her renal insufficiency, do not  recommend a CT IVP. She will need to complete hematuria workup by going to the operating room and having a cystoscopy with possible TURBT, bilateral retrograde pyelograms, possible bilateral diagnostic ureteroscopy with possible lithotripsy or mass biopsy, possible bilateral ureteral stent placement. The patient and the family agree to proceed and understand the potential risks including but not limited to bleeding, infection, injury to surrounding structures, injury to the ureter, need for other procedures.    Signed by Link Snuffer, III, M.D. on 02/03/17 at 5:53 PM (EST)

## 2017-02-27 NOTE — Anesthesia Postprocedure Evaluation (Signed)
Anesthesia Post Note  Patient: CECILY LAWHORNE  Procedure(s) Performed: CYSTOSCOPY WITH BILATERAL RETROGRADE PYELOGRAM,  URETEROSCOPY, STENT PLACEMENT (Left )     Patient location during evaluation: PACU Anesthesia Type: General Level of consciousness: awake and alert Pain management: pain level controlled Vital Signs Assessment: post-procedure vital signs reviewed and stable Respiratory status: spontaneous breathing, nonlabored ventilation, respiratory function stable and patient connected to nasal cannula oxygen Cardiovascular status: blood pressure returned to baseline and stable Postop Assessment: no apparent nausea or vomiting Anesthetic complications: no    Last Vitals:  Vitals:   02/27/17 1300 02/27/17 1315  BP: (!) 170/51 (!) 172/53  Pulse: (!) 55 (!) 56  Resp: 15 16  Temp: 36.4 C 36.4 C  SpO2: 94% 93%    Last Pain:  Vitals:   02/27/17 1315  TempSrc:   PainSc: 0-No pain                 Barnet Glasgow

## 2017-02-27 NOTE — Anesthesia Procedure Notes (Signed)
Procedure Name: LMA Insertion Date/Time: 02/27/2017 11:10 AM Performed by: Lavina Hamman, CRNA Pre-anesthesia Checklist: Patient identified, Emergency Drugs available, Suction available and Patient being monitored Patient Re-evaluated:Patient Re-evaluated prior to induction Oxygen Delivery Method: Circle System Utilized Preoxygenation: Pre-oxygenation with 100% oxygen Induction Type: IV induction Ventilation: Mask ventilation without difficulty LMA: LMA inserted LMA Size: 3.0 Number of attempts: 1 Airway Equipment and Method: Bite block Placement Confirmation: positive ETCO2 Tube secured with: Tape Dental Injury: Teeth and Oropharynx as per pre-operative assessment

## 2017-02-27 NOTE — Transfer of Care (Signed)
Immediate Anesthesia Transfer of Care Note  Patient: [name]  Procedure(s) Performed: CYSTOSCOPY WITH BILATERAL RETROGRADE PYELOGRAM,  URETEROSCOPY, STENT PLACEMENT (Left )  Patient Location: PACU  Anesthesia Type:General  Level of Consciousness: awake, alert  and oriented  Airway & Oxygen Therapy: Patient Spontanous Breathing and Patient connected to face mask oxygen  Post-op Assessment: Report given to RN  Post vital signs: Reviewed and stable  Last Vitals:  Vitals:   02/27/17 0812  Pulse: 72  Resp: 18  Temp: 36.5 C  SpO2: 98%    Last Pain:  Vitals:   02/27/17 0812  TempSrc: Oral      Patients Stated Pain Goal: 3 (88/67/73 7366)  Complications: No apparent anesthesia complications

## 2017-02-28 ENCOUNTER — Non-Acute Institutional Stay (SKILLED_NURSING_FACILITY): Payer: Medicare Other | Admitting: Internal Medicine

## 2017-02-28 ENCOUNTER — Encounter: Payer: Self-pay | Admitting: Internal Medicine

## 2017-02-28 DIAGNOSIS — I5043 Acute on chronic combined systolic (congestive) and diastolic (congestive) heart failure: Secondary | ICD-10-CM

## 2017-02-28 DIAGNOSIS — I10 Essential (primary) hypertension: Secondary | ICD-10-CM | POA: Diagnosis not present

## 2017-02-28 DIAGNOSIS — D508 Other iron deficiency anemias: Secondary | ICD-10-CM | POA: Diagnosis not present

## 2017-02-28 DIAGNOSIS — R319 Hematuria, unspecified: Secondary | ICD-10-CM

## 2017-02-28 DIAGNOSIS — N184 Chronic kidney disease, stage 4 (severe): Secondary | ICD-10-CM

## 2017-02-28 DIAGNOSIS — N2889 Other specified disorders of kidney and ureter: Secondary | ICD-10-CM | POA: Diagnosis not present

## 2017-02-28 NOTE — Assessment & Plan Note (Signed)
Aspirin, anticoagulants and nonsteroidals contraindicated

## 2017-02-28 NOTE — Assessment & Plan Note (Signed)
02/28/17 repeat blood pressure normal; if blood pressure remains elevated, titrate carvedilol as this will address hypertension as well as the combined congestive heart failure

## 2017-02-28 NOTE — Assessment & Plan Note (Addendum)
02/28/17 clinically compromised and stable on lower dose furosemide

## 2017-02-28 NOTE — Progress Notes (Signed)
NURSING HOME LOCATION:  [facility] ROOM NUMBER:  [id number]  CODE STATUS:  Full Code  PCP:  Arnoldo Morale, MD  [place] [address]  This is a nursing facility follow up for [place] readmission within 30 days  Interim medical record and care since last [place] visit was updated with review of diagnostic studies and change in clinical status since last visit were documented.  HPI: The patient was hospitalized 12/28-12/30/18 for progressive symptomatic anemia in the context of chronic GI blood loss due to small and large bowel erosions. The patient received 1 unit of packed red cells and hemoglobin stabilized at 7.9. Aspirin was stopped. Nonsteroidals were contraindicated also because of potential GI bleed excaerbation. She had progressive renal insufficiency with creatinine rising to 4.2. Lasix was reduced from 60 mg twice a day to 20 mg twice a day. Creatinine did drop to 3.54. Furosemide was subsequently increased to 40 mg twice a day.  Sodium bicarbonate was administered for metabolic acidosis  The patient has long-standing dementia which was stable with no behavioral disorders. She received sliding scale insulin for her diabetes.  At discharge BUN was 63, creatinine 3.78, and GFR 11. On 02/27/17 cystoscopy, bilateral retrograde pyelogram, left diagnostic ureteroscopically with left ureteral stent placement was pursued by Dr. Gloriann Loan, urology for hematuria and progressive renal insufficiency. Diagnostic ureteroscopy revealed no obvious lesions or stones  Review of systems: Dementia invalidated responses.  the patient is unaware that she was hospitalized or had the urologic procedure. When asked where she had gone she stated "downstairs". At this time she denies any active symptoms, stating "I feel fine".   Constitutional: No fever,significant weight change, fatigue  Eyes: No redness, discharge, pain, vision change ENT/mouth: No nasal congestion,   purulent discharge, earache,change in hearing ,sore throat  Cardiovascular: No chest pain, palpitations,paroxysmal nocturnal dyspnea, claudication, edema  Respiratory: No cough, sputum production,hemoptysis, DOE , significant snoring,apnea  Gastrointestinal: No heartburn,dysphagia,abdominal pain, nausea / vomiting,rectal bleeding, melena,change in bowels Genitourinary: No dysuria,hematuria, pyuria,  incontinence, nocturia Musculoskeletal: No joint stiffness, joint swelling, weakness,pain Dermatologic: No rash, pruritus, change in appearance of skin Neurologic: No dizziness,headache,syncope, seizures, numbness , tingling Psychiatric: No significant anxiety , depression, insomnia, anorexia Endocrine: No change in hair/skin/ nails, excessive thirst, excessive hunger, excessive urination  Hematologic/lymphatic: No significant bruising, lymphadenopathy,abnormal bleeding Allergy/immunology: No itchy/ watery eyes, significant sneezing, urticaria, angioedema  Physical exam:  Pertinent or positive findings: facies are weathered.She is a few remaining mandibular teeth. There is accentuated curvature of the thoracic spine. She has scattered dry rales. Heart rhythm and rate are distant and irregular. She has 1/2+ edema to the sock line. Pedal pulses are decreased. Varus deformity of the knees is present. She is weaker in the lower extremities than in the upper extremities.   General appearance: no acute distress , increased work of breathing is present.   Lymphatic: No lymphadenopathy about the head, neck, axilla . Eyes: No conjunctival inflammation or lid edema is present. There is no scleral icterus. Ears:  External ear exam shows no significant lesions or deformities.   Nose:  External nasal examination shows no deformity or inflammation. Nasal mucosa are pink and moist without lesions ,exudates Oral exam: lips and gums are healthy appearing.There is no oropharyngeal erythema or exudate . Neck:  No  thyromegaly, masses, tenderness noted.    Heart:  No gallop, murmur, click, rub .  Lungs: without wheezes, rhonchi, rubs. Abdomen:Bowel sounds are normal. Abdomen is soft and nontender  with no organomegaly, hernias,masses. GU: deferred  Extremities:  No cyanosis, clubbing  Neurologic exam : Balance,Rhomberg,finger to nose testing could not be completed due to clinical state Skin: Warm & dry w/o tenting. No significant lesions or rash.  See summary under each active problem in the Problem List with associated updated therapeutic plan

## 2017-02-28 NOTE — Assessment & Plan Note (Signed)
Progressive renal insufficiency is not due to obstructve lesion as Dr Purvis Sheffield excellent workup documents

## 2017-02-28 NOTE — Patient Instructions (Signed)
See assessment and plan under each diagnosis in the problem list and acutely for this visit 

## 2017-02-28 NOTE — Assessment & Plan Note (Signed)
02/28/17 continue furosemide 40 mg twice a day; consider dose readjustment if there is progressive rise in the creatinine

## 2017-02-28 NOTE — Assessment & Plan Note (Addendum)
See 02/28/17 documentation under "Hematuria", no mass lesion present at cystoscopy

## 2017-03-03 DIAGNOSIS — Z48817 Encounter for surgical aftercare following surgery on the skin and subcutaneous tissue: Secondary | ICD-10-CM | POA: Diagnosis not present

## 2017-03-03 DIAGNOSIS — J189 Pneumonia, unspecified organism: Secondary | ICD-10-CM | POA: Diagnosis not present

## 2017-03-03 DIAGNOSIS — J449 Chronic obstructive pulmonary disease, unspecified: Secondary | ICD-10-CM | POA: Diagnosis not present

## 2017-03-03 DIAGNOSIS — I5032 Chronic diastolic (congestive) heart failure: Secondary | ICD-10-CM | POA: Diagnosis not present

## 2017-03-03 DIAGNOSIS — L02214 Cutaneous abscess of groin: Secondary | ICD-10-CM | POA: Diagnosis not present

## 2017-03-06 DIAGNOSIS — E8770 Fluid overload, unspecified: Secondary | ICD-10-CM | POA: Diagnosis not present

## 2017-03-06 DIAGNOSIS — I129 Hypertensive chronic kidney disease with stage 1 through stage 4 chronic kidney disease, or unspecified chronic kidney disease: Secondary | ICD-10-CM | POA: Diagnosis not present

## 2017-03-06 DIAGNOSIS — I701 Atherosclerosis of renal artery: Secondary | ICD-10-CM | POA: Diagnosis not present

## 2017-03-06 DIAGNOSIS — N184 Chronic kidney disease, stage 4 (severe): Secondary | ICD-10-CM | POA: Diagnosis not present

## 2017-03-07 DIAGNOSIS — M25551 Pain in right hip: Secondary | ICD-10-CM | POA: Diagnosis not present

## 2017-03-16 DIAGNOSIS — R310 Gross hematuria: Secondary | ICD-10-CM | POA: Diagnosis not present

## 2017-03-17 ENCOUNTER — Other Ambulatory Visit: Payer: Self-pay | Admitting: Family Medicine

## 2017-03-20 DIAGNOSIS — N184 Chronic kidney disease, stage 4 (severe): Secondary | ICD-10-CM | POA: Diagnosis not present

## 2017-03-20 DIAGNOSIS — R262 Difficulty in walking, not elsewhere classified: Secondary | ICD-10-CM | POA: Diagnosis not present

## 2017-03-20 DIAGNOSIS — F0280 Dementia in other diseases classified elsewhere without behavioral disturbance: Secondary | ICD-10-CM | POA: Diagnosis not present

## 2017-03-20 DIAGNOSIS — I5042 Chronic combined systolic (congestive) and diastolic (congestive) heart failure: Secondary | ICD-10-CM | POA: Diagnosis not present

## 2017-03-20 DIAGNOSIS — E1122 Type 2 diabetes mellitus with diabetic chronic kidney disease: Secondary | ICD-10-CM | POA: Diagnosis not present

## 2017-03-20 DIAGNOSIS — J449 Chronic obstructive pulmonary disease, unspecified: Secondary | ICD-10-CM | POA: Diagnosis not present

## 2017-03-20 DIAGNOSIS — Z9181 History of falling: Secondary | ICD-10-CM | POA: Diagnosis not present

## 2017-03-20 DIAGNOSIS — D500 Iron deficiency anemia secondary to blood loss (chronic): Secondary | ICD-10-CM | POA: Diagnosis not present

## 2017-03-20 DIAGNOSIS — M6281 Muscle weakness (generalized): Secondary | ICD-10-CM | POA: Diagnosis not present

## 2017-03-20 DIAGNOSIS — G3183 Dementia with Lewy bodies: Secondary | ICD-10-CM | POA: Diagnosis not present

## 2017-03-20 DIAGNOSIS — R278 Other lack of coordination: Secondary | ICD-10-CM | POA: Diagnosis not present

## 2017-03-20 DIAGNOSIS — I130 Hypertensive heart and chronic kidney disease with heart failure and stage 1 through stage 4 chronic kidney disease, or unspecified chronic kidney disease: Secondary | ICD-10-CM | POA: Diagnosis not present

## 2017-03-20 DIAGNOSIS — I2510 Atherosclerotic heart disease of native coronary artery without angina pectoris: Secondary | ICD-10-CM | POA: Diagnosis not present

## 2017-03-20 DIAGNOSIS — D638 Anemia in other chronic diseases classified elsewhere: Secondary | ICD-10-CM | POA: Diagnosis not present

## 2017-03-21 DIAGNOSIS — Z9181 History of falling: Secondary | ICD-10-CM | POA: Diagnosis not present

## 2017-03-21 DIAGNOSIS — R278 Other lack of coordination: Secondary | ICD-10-CM | POA: Diagnosis not present

## 2017-03-21 DIAGNOSIS — D638 Anemia in other chronic diseases classified elsewhere: Secondary | ICD-10-CM | POA: Diagnosis not present

## 2017-03-21 DIAGNOSIS — I5042 Chronic combined systolic (congestive) and diastolic (congestive) heart failure: Secondary | ICD-10-CM | POA: Diagnosis not present

## 2017-03-21 DIAGNOSIS — N184 Chronic kidney disease, stage 4 (severe): Secondary | ICD-10-CM | POA: Diagnosis not present

## 2017-03-21 DIAGNOSIS — M6281 Muscle weakness (generalized): Secondary | ICD-10-CM | POA: Diagnosis not present

## 2017-03-21 DIAGNOSIS — R262 Difficulty in walking, not elsewhere classified: Secondary | ICD-10-CM | POA: Diagnosis not present

## 2017-03-21 DIAGNOSIS — J449 Chronic obstructive pulmonary disease, unspecified: Secondary | ICD-10-CM | POA: Diagnosis not present

## 2017-03-22 DIAGNOSIS — D638 Anemia in other chronic diseases classified elsewhere: Secondary | ICD-10-CM | POA: Diagnosis not present

## 2017-03-22 DIAGNOSIS — I5042 Chronic combined systolic (congestive) and diastolic (congestive) heart failure: Secondary | ICD-10-CM | POA: Diagnosis not present

## 2017-03-22 DIAGNOSIS — M6281 Muscle weakness (generalized): Secondary | ICD-10-CM | POA: Diagnosis not present

## 2017-03-22 DIAGNOSIS — Z9181 History of falling: Secondary | ICD-10-CM | POA: Diagnosis not present

## 2017-03-22 DIAGNOSIS — R262 Difficulty in walking, not elsewhere classified: Secondary | ICD-10-CM | POA: Diagnosis not present

## 2017-03-22 DIAGNOSIS — R278 Other lack of coordination: Secondary | ICD-10-CM | POA: Diagnosis not present

## 2017-03-22 DIAGNOSIS — J449 Chronic obstructive pulmonary disease, unspecified: Secondary | ICD-10-CM | POA: Diagnosis not present

## 2017-03-22 DIAGNOSIS — N184 Chronic kidney disease, stage 4 (severe): Secondary | ICD-10-CM | POA: Diagnosis not present

## 2017-03-23 ENCOUNTER — Telehealth: Payer: Self-pay | Admitting: Family Medicine

## 2017-03-23 DIAGNOSIS — I5042 Chronic combined systolic (congestive) and diastolic (congestive) heart failure: Secondary | ICD-10-CM | POA: Diagnosis not present

## 2017-03-23 DIAGNOSIS — R278 Other lack of coordination: Secondary | ICD-10-CM | POA: Diagnosis not present

## 2017-03-23 DIAGNOSIS — J449 Chronic obstructive pulmonary disease, unspecified: Secondary | ICD-10-CM | POA: Diagnosis not present

## 2017-03-23 DIAGNOSIS — M6281 Muscle weakness (generalized): Secondary | ICD-10-CM | POA: Diagnosis not present

## 2017-03-23 DIAGNOSIS — R262 Difficulty in walking, not elsewhere classified: Secondary | ICD-10-CM | POA: Diagnosis not present

## 2017-03-23 DIAGNOSIS — Z9181 History of falling: Secondary | ICD-10-CM | POA: Diagnosis not present

## 2017-03-23 DIAGNOSIS — D638 Anemia in other chronic diseases classified elsewhere: Secondary | ICD-10-CM | POA: Diagnosis not present

## 2017-03-23 DIAGNOSIS — N184 Chronic kidney disease, stage 4 (severe): Secondary | ICD-10-CM | POA: Diagnosis not present

## 2017-03-23 NOTE — Telephone Encounter (Signed)
Michelle was called and given the verbal orders for OT. 

## 2017-03-23 NOTE — Telephone Encounter (Signed)
Patients home health PT called and requested for a verbal order for pt 1x1wk and 2x5wks. Please fu .

## 2017-03-24 ENCOUNTER — Telehealth: Payer: Self-pay | Admitting: Family Medicine

## 2017-03-24 DIAGNOSIS — J449 Chronic obstructive pulmonary disease, unspecified: Secondary | ICD-10-CM | POA: Diagnosis not present

## 2017-03-24 NOTE — Telephone Encounter (Signed)
Will route to PCP 

## 2017-03-24 NOTE — Telephone Encounter (Signed)
[name] from [facility] called to let pt. PCP know that pt. Heart rate is 54 and it's below the parameter that is set for 60. Please f/u with [name].

## 2017-03-27 NOTE — Telephone Encounter (Signed)
She is slightly below normal. No action needs to be taken.

## 2017-04-05 ENCOUNTER — Telehealth: Payer: Self-pay | Admitting: Family Medicine

## 2017-04-05 NOTE — Telephone Encounter (Signed)
[facility]eral home came by and picked up death certificate.

## 2017-04-12 ENCOUNTER — Ambulatory Visit: Payer: Medicare Other | Admitting: Family Medicine

## 2017-04-21 DIAGNOSIS — 419620001 Death: Secondary | SNOMED CT | POA: Diagnosis not present

## 2017-04-21 DEATH — deceased

## 2017-05-04 ENCOUNTER — Other Ambulatory Visit: Payer: Self-pay | Admitting: Nurse Practitioner

## 2019-12-03 IMAGING — US US RENAL
1 series · 14 of 25 positions shown · non-contrast
Comparison: CT of the abdomen and pelvis from 01/10/2017, and renal
ultrasound performed 03/07/2015

CLINICAL DATA: Acute onset of renal insufficiency.

EXAM:
RENAL / URINARY TRACT ULTRASOUND COMPLETE

[Series 1: us renal · 0.22mm/px · 14 of 51 slices shown]
[im 1/51]
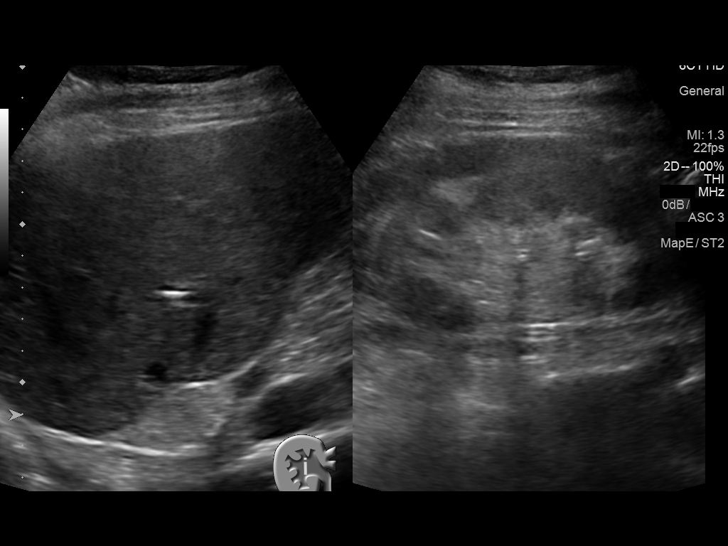
[im 5/51]
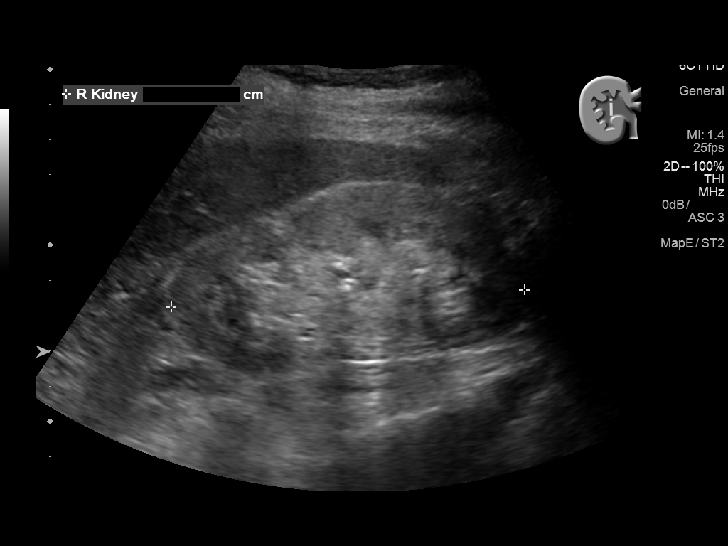
[im 9/51]
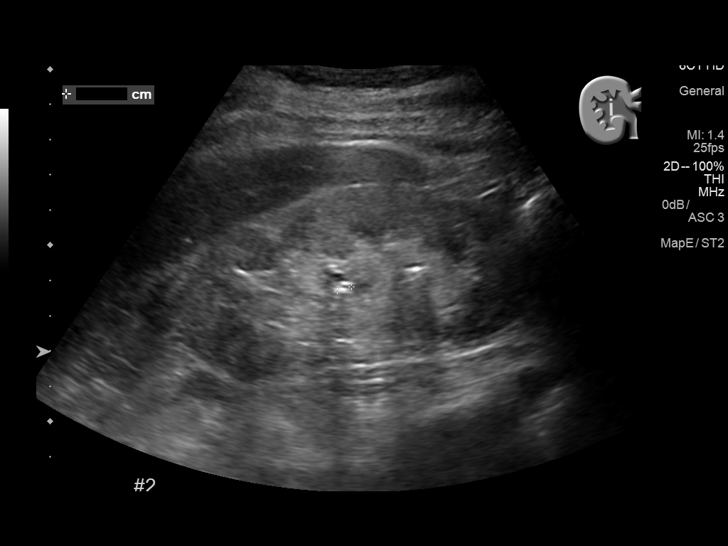
[im 13/51]
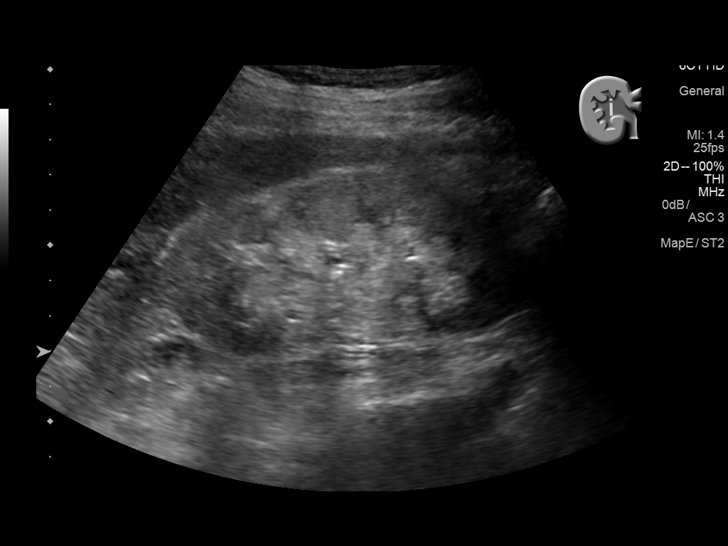
[im 17/51]
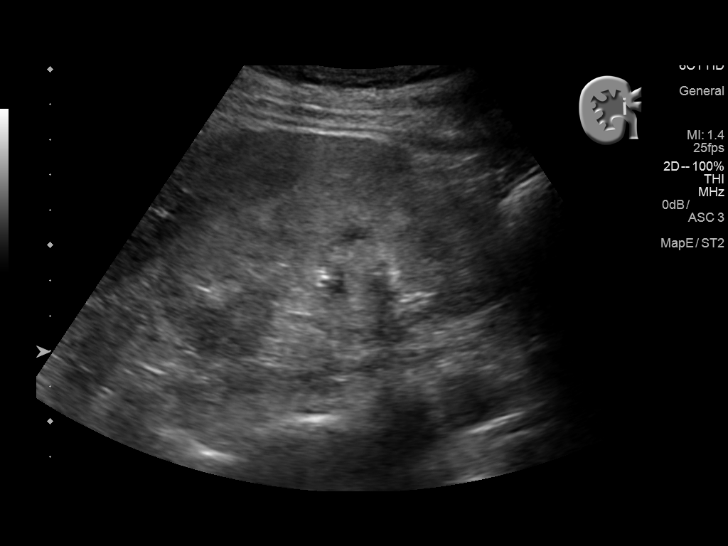
[im 19/51]
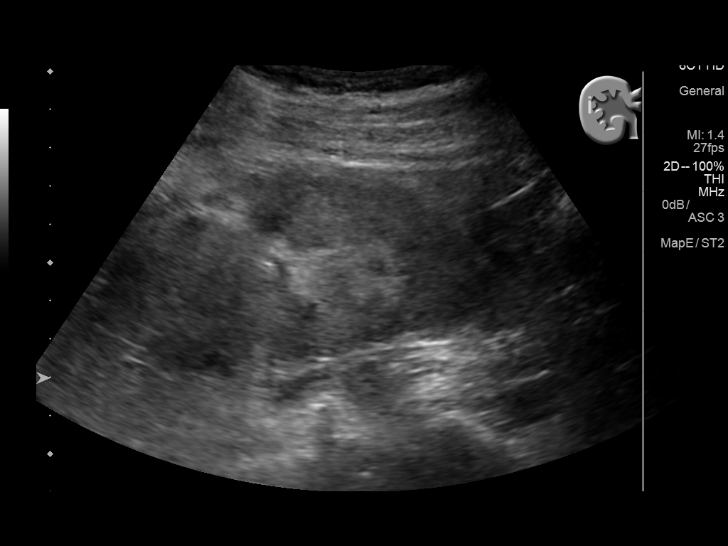
[im 23/51]
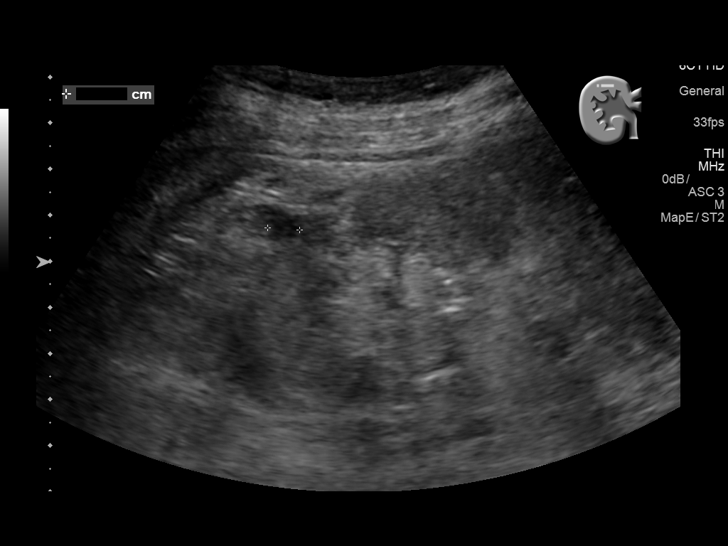
[im 28/51]
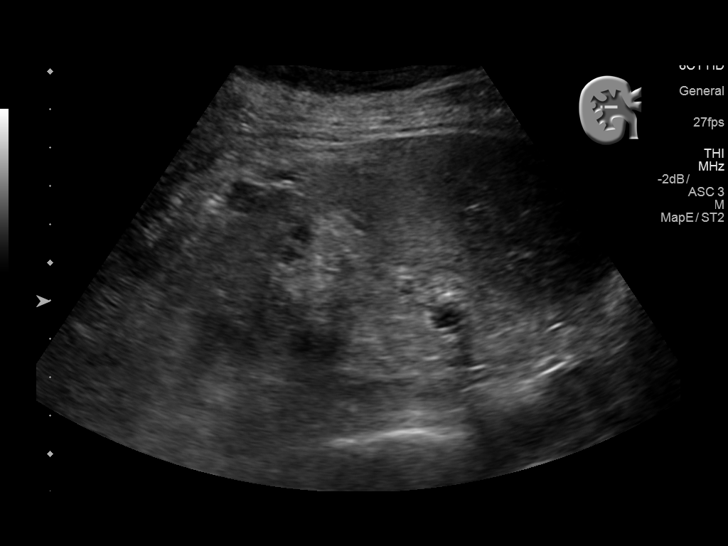
[im 32/51]
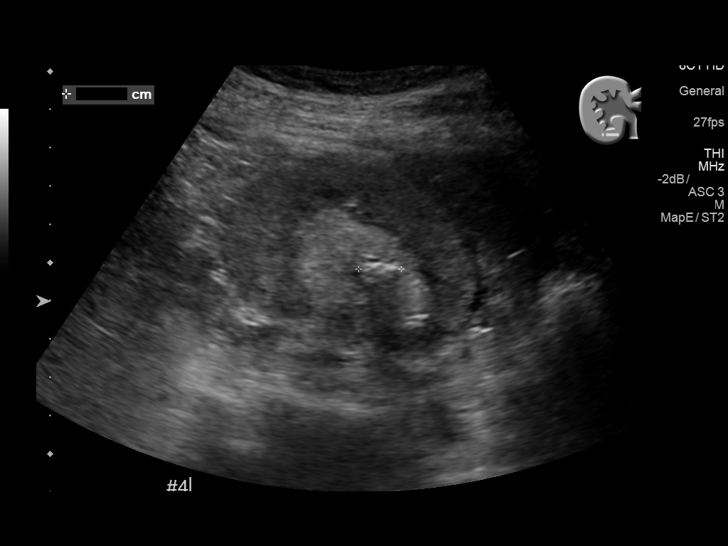
[im 34/51]
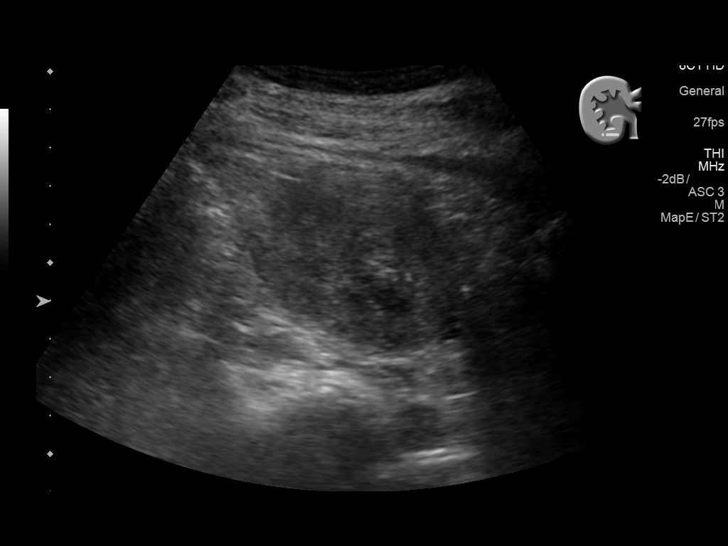
[im 38/51]
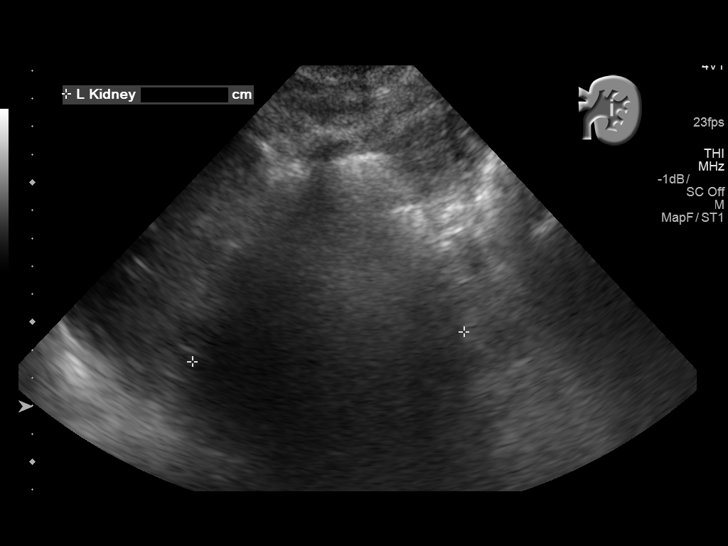
[im 42/51]
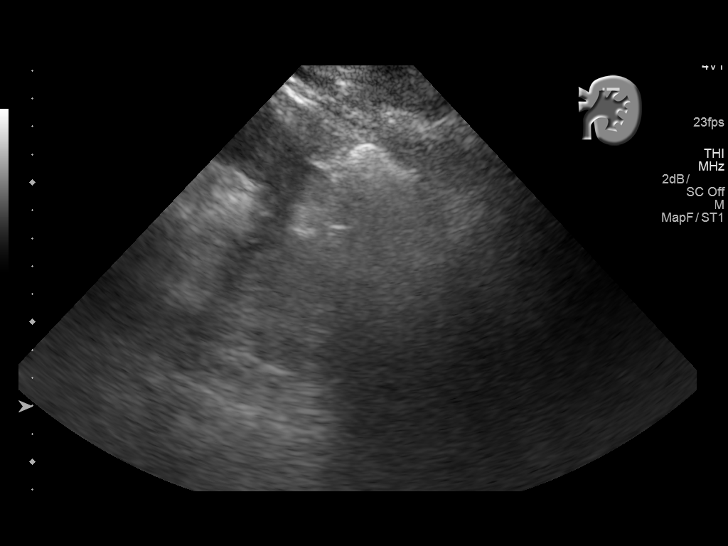
[im 46/51]
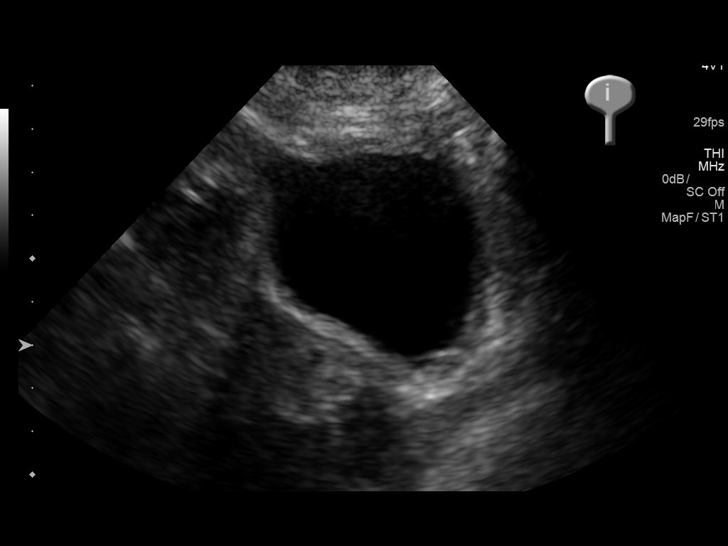
[im 51/51]
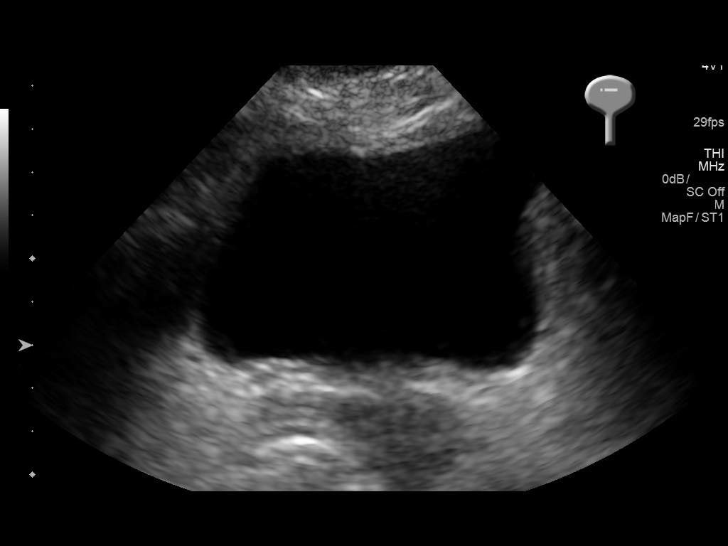

[14 of 25 positions shown; findings below may reference images not displayed]

FINDINGS: Right Kidney:

Length: 10.1 cm. Diffusely increased parenchymal echogenicity is
noted. A small 0.8 cm cyst is noted at the upper pole of the right
kidney. Scattered vascular calcifications are noted at the right
hilum, on comparison with prior CT. No mass or hydronephrosis
visualized.

Left Kidney:

Length: 0.8 cm. Very poorly characterized due to overlying bowel gas
and limitations in positioning.

Bladder:

Appears normal for degree of bladder distention.
IMPRESSION: 1. No evidence of hydronephrosis. Left kidney is very poorly
characterized due to overlying bowel gas and limitations in
positioning.
2. Diffusely increased renal parenchymal echogenicity on the right
raises concern for medical renal disease.
3. Small right renal cyst noted.
